# Patient Record
Sex: Female | Born: 1935 | Race: Black or African American | Hispanic: No | Smoking: Never smoker
Health system: Southern US, Community
[De-identification: ages and names within clinical notes are randomized; demographics above are authoritative.]

## PROBLEM LIST (undated history)

## (undated) DIAGNOSIS — N2889 Other specified disorders of kidney and ureter: Secondary | ICD-10-CM

## (undated) DIAGNOSIS — I82442 Acute embolism and thrombosis of left tibial vein: Secondary | ICD-10-CM

## (undated) DIAGNOSIS — K921 Melena: Secondary | ICD-10-CM

## (undated) DIAGNOSIS — I639 Cerebral infarction, unspecified: Secondary | ICD-10-CM

## (undated) DIAGNOSIS — E876 Hypokalemia: Secondary | ICD-10-CM

## (undated) DIAGNOSIS — C801 Malignant (primary) neoplasm, unspecified: Secondary | ICD-10-CM

## (undated) DIAGNOSIS — F32A Depression, unspecified: Secondary | ICD-10-CM

## (undated) DIAGNOSIS — M4800 Spinal stenosis, site unspecified: Secondary | ICD-10-CM

## (undated) DIAGNOSIS — K08109 Complete loss of teeth, unspecified cause, unspecified class: Secondary | ICD-10-CM

## (undated) DIAGNOSIS — I10 Essential (primary) hypertension: Secondary | ICD-10-CM

## (undated) DIAGNOSIS — I471 Supraventricular tachycardia: Secondary | ICD-10-CM

## (undated) DIAGNOSIS — F329 Major depressive disorder, single episode, unspecified: Secondary | ICD-10-CM

## (undated) DIAGNOSIS — K219 Gastro-esophageal reflux disease without esophagitis: Secondary | ICD-10-CM

## (undated) DIAGNOSIS — T4145XA Adverse effect of unspecified anesthetic, initial encounter: Secondary | ICD-10-CM

## (undated) DIAGNOSIS — E119 Type 2 diabetes mellitus without complications: Secondary | ICD-10-CM

## (undated) DIAGNOSIS — N189 Chronic kidney disease, unspecified: Secondary | ICD-10-CM

## (undated) DIAGNOSIS — M1990 Unspecified osteoarthritis, unspecified site: Secondary | ICD-10-CM

## (undated) DIAGNOSIS — T8859XA Other complications of anesthesia, initial encounter: Secondary | ICD-10-CM

## (undated) DIAGNOSIS — C241 Malignant neoplasm of ampulla of Vater: Secondary | ICD-10-CM

## (undated) DIAGNOSIS — Z972 Presence of dental prosthetic device (complete) (partial): Secondary | ICD-10-CM

## (undated) DIAGNOSIS — K3184 Gastroparesis: Secondary | ICD-10-CM

## (undated) DIAGNOSIS — E039 Hypothyroidism, unspecified: Secondary | ICD-10-CM

## (undated) DIAGNOSIS — F419 Anxiety disorder, unspecified: Secondary | ICD-10-CM

## (undated) DIAGNOSIS — J189 Pneumonia, unspecified organism: Secondary | ICD-10-CM

## (undated) DIAGNOSIS — M48062 Spinal stenosis, lumbar region with neurogenic claudication: Secondary | ICD-10-CM

## (undated) DIAGNOSIS — E785 Hyperlipidemia, unspecified: Secondary | ICD-10-CM

## (undated) HISTORY — DX: Type 2 diabetes mellitus without complications: E11.9

## (undated) HISTORY — DX: Spinal stenosis, lumbar region with neurogenic claudication: M48.062

## (undated) HISTORY — DX: Malignant (primary) neoplasm, unspecified: C80.1

## (undated) HISTORY — PX: OTHER SURGICAL HISTORY: SHX169

## (undated) HISTORY — PX: ABDOMINAL HYSTERECTOMY: SHX81

## (undated) HISTORY — DX: Cerebral infarction, unspecified: I63.9

## (undated) HISTORY — DX: Malignant neoplasm of ampulla of Vater: C24.1

## (undated) HISTORY — DX: Unspecified osteoarthritis, unspecified site: M19.90

## (undated) HISTORY — PX: LUMBAR SPINE SURGERY: SHX701

## (undated) HISTORY — DX: Acute embolism and thrombosis of left tibial vein: I82.442

## (undated) HISTORY — DX: Spinal stenosis, site unspecified: M48.00

## (undated) HISTORY — DX: Supraventricular tachycardia: I47.1

## (undated) HISTORY — DX: Gastroparesis: K31.84

## (undated) HISTORY — DX: Gastro-esophageal reflux disease without esophagitis: K21.9

## (undated) HISTORY — DX: Hyperlipidemia, unspecified: E78.5

## (undated) HISTORY — PX: TONSILLECTOMY: SUR1361

## (undated) HISTORY — PX: COLONOSCOPY W/ POLYPECTOMY: SHX1380

## (undated) HISTORY — DX: Hypokalemia: E87.6

## (undated) HISTORY — DX: Other specified disorders of kidney and ureter: N28.89

---

## 1997-12-04 ENCOUNTER — Ambulatory Visit (HOSPITAL_COMMUNITY): Admission: RE | Admit: 1997-12-04 | Discharge: 1997-12-04 | Payer: Self-pay | Admitting: Cardiology

## 1997-12-20 ENCOUNTER — Encounter: Admission: RE | Admit: 1997-12-20 | Discharge: 1998-03-20 | Payer: Self-pay | Admitting: Cardiology

## 1998-01-28 ENCOUNTER — Other Ambulatory Visit: Admission: RE | Admit: 1998-01-28 | Discharge: 1998-01-28 | Payer: Self-pay | Admitting: Internal Medicine

## 1999-05-14 ENCOUNTER — Encounter: Admission: RE | Admit: 1999-05-14 | Discharge: 1999-08-12 | Payer: Self-pay | Admitting: Internal Medicine

## 1999-05-14 ENCOUNTER — Encounter (HOSPITAL_BASED_OUTPATIENT_CLINIC_OR_DEPARTMENT_OTHER): Payer: Self-pay | Admitting: Internal Medicine

## 1999-12-15 ENCOUNTER — Ambulatory Visit (HOSPITAL_COMMUNITY): Admission: RE | Admit: 1999-12-15 | Discharge: 1999-12-15 | Payer: Self-pay | Admitting: Cardiology

## 1999-12-15 ENCOUNTER — Encounter: Payer: Self-pay | Admitting: Cardiology

## 2000-07-15 ENCOUNTER — Encounter: Admission: RE | Admit: 2000-07-15 | Discharge: 2000-07-15 | Payer: Self-pay | Admitting: Cardiology

## 2000-07-15 ENCOUNTER — Encounter: Payer: Self-pay | Admitting: Cardiology

## 2002-08-11 ENCOUNTER — Emergency Department (HOSPITAL_COMMUNITY): Admission: EM | Admit: 2002-08-11 | Discharge: 2002-08-11 | Payer: Self-pay | Admitting: Emergency Medicine

## 2002-08-11 ENCOUNTER — Encounter: Payer: Self-pay | Admitting: Emergency Medicine

## 2002-09-08 ENCOUNTER — Ambulatory Visit (HOSPITAL_COMMUNITY): Admission: RE | Admit: 2002-09-08 | Discharge: 2002-09-08 | Payer: Self-pay | Admitting: Cardiology

## 2002-09-08 ENCOUNTER — Encounter: Payer: Self-pay | Admitting: Cardiology

## 2002-10-23 ENCOUNTER — Emergency Department (HOSPITAL_COMMUNITY): Admission: EM | Admit: 2002-10-23 | Discharge: 2002-10-23 | Payer: Self-pay | Admitting: Emergency Medicine

## 2002-10-24 ENCOUNTER — Ambulatory Visit (HOSPITAL_COMMUNITY): Admission: RE | Admit: 2002-10-24 | Discharge: 2002-10-24 | Payer: Self-pay | Admitting: Cardiology

## 2003-08-18 HISTORY — PX: THYROIDECTOMY: SHX17

## 2003-08-21 ENCOUNTER — Inpatient Hospital Stay (HOSPITAL_COMMUNITY): Admission: EM | Admit: 2003-08-21 | Discharge: 2003-08-23 | Payer: Self-pay | Admitting: Emergency Medicine

## 2004-04-03 ENCOUNTER — Ambulatory Visit (HOSPITAL_COMMUNITY): Admission: RE | Admit: 2004-04-03 | Discharge: 2004-04-03 | Payer: Self-pay | Admitting: Internal Medicine

## 2004-06-26 ENCOUNTER — Ambulatory Visit (HOSPITAL_COMMUNITY): Admission: RE | Admit: 2004-06-26 | Discharge: 2004-06-27 | Payer: Self-pay | Admitting: General Surgery

## 2004-06-26 ENCOUNTER — Encounter (INDEPENDENT_AMBULATORY_CARE_PROVIDER_SITE_OTHER): Payer: Self-pay | Admitting: *Deleted

## 2004-07-21 ENCOUNTER — Encounter (HOSPITAL_COMMUNITY): Admission: RE | Admit: 2004-07-21 | Discharge: 2004-10-19 | Payer: Self-pay | Admitting: Endocrinology

## 2004-08-05 ENCOUNTER — Ambulatory Visit: Payer: Self-pay | Admitting: Endocrinology

## 2004-09-05 ENCOUNTER — Ambulatory Visit: Payer: Self-pay | Admitting: Endocrinology

## 2004-09-09 ENCOUNTER — Ambulatory Visit: Payer: Self-pay | Admitting: Endocrinology

## 2004-09-17 ENCOUNTER — Ambulatory Visit: Payer: Self-pay | Admitting: Endocrinology

## 2004-09-25 ENCOUNTER — Ambulatory Visit (HOSPITAL_COMMUNITY): Admission: RE | Admit: 2004-09-25 | Discharge: 2004-09-25 | Payer: Self-pay | Admitting: Endocrinology

## 2004-10-21 ENCOUNTER — Ambulatory Visit: Payer: Self-pay | Admitting: Endocrinology

## 2004-11-17 ENCOUNTER — Ambulatory Visit: Admission: RE | Admit: 2004-11-17 | Discharge: 2004-11-17 | Payer: Self-pay | Admitting: Endocrinology

## 2004-11-20 ENCOUNTER — Ambulatory Visit: Payer: Self-pay | Admitting: Endocrinology

## 2005-01-16 ENCOUNTER — Encounter (INDEPENDENT_AMBULATORY_CARE_PROVIDER_SITE_OTHER): Payer: Self-pay | Admitting: *Deleted

## 2005-01-16 ENCOUNTER — Ambulatory Visit (HOSPITAL_COMMUNITY): Admission: RE | Admit: 2005-01-16 | Discharge: 2005-01-16 | Payer: Self-pay | Admitting: Gastroenterology

## 2005-02-02 ENCOUNTER — Encounter: Admission: RE | Admit: 2005-02-02 | Discharge: 2005-02-02 | Payer: Self-pay | Admitting: Gastroenterology

## 2005-02-19 ENCOUNTER — Encounter: Admission: RE | Admit: 2005-02-19 | Discharge: 2005-02-19 | Payer: Self-pay | Admitting: Gastroenterology

## 2005-04-08 ENCOUNTER — Ambulatory Visit: Payer: Self-pay | Admitting: Endocrinology

## 2005-05-08 ENCOUNTER — Ambulatory Visit: Payer: Self-pay | Admitting: Endocrinology

## 2005-05-29 ENCOUNTER — Ambulatory Visit: Payer: Self-pay | Admitting: Endocrinology

## 2005-06-01 ENCOUNTER — Encounter (HOSPITAL_COMMUNITY): Admission: RE | Admit: 2005-06-01 | Discharge: 2005-08-11 | Payer: Self-pay | Admitting: Endocrinology

## 2005-07-14 ENCOUNTER — Ambulatory Visit: Payer: Self-pay | Admitting: Endocrinology

## 2005-07-29 ENCOUNTER — Ambulatory Visit: Payer: Self-pay | Admitting: Cardiology

## 2005-11-06 ENCOUNTER — Ambulatory Visit: Payer: Self-pay | Admitting: Endocrinology

## 2007-04-20 ENCOUNTER — Encounter: Payer: Self-pay | Admitting: Endocrinology

## 2007-04-20 DIAGNOSIS — E119 Type 2 diabetes mellitus without complications: Secondary | ICD-10-CM | POA: Insufficient documentation

## 2007-04-20 DIAGNOSIS — E785 Hyperlipidemia, unspecified: Secondary | ICD-10-CM

## 2007-04-20 HISTORY — DX: Hyperlipidemia, unspecified: E78.5

## 2007-04-20 HISTORY — DX: Type 2 diabetes mellitus without complications: E11.9

## 2008-10-25 ENCOUNTER — Encounter: Admission: RE | Admit: 2008-10-25 | Discharge: 2008-10-25 | Payer: Self-pay | Admitting: Internal Medicine

## 2008-11-05 ENCOUNTER — Encounter: Admission: RE | Admit: 2008-11-05 | Discharge: 2008-11-05 | Payer: Self-pay | Admitting: Internal Medicine

## 2008-11-08 ENCOUNTER — Encounter: Admission: RE | Admit: 2008-11-08 | Discharge: 2008-11-08 | Payer: Self-pay | Admitting: Internal Medicine

## 2009-08-20 ENCOUNTER — Encounter: Admission: RE | Admit: 2009-08-20 | Discharge: 2009-08-20 | Payer: Self-pay | Admitting: Internal Medicine

## 2009-08-29 ENCOUNTER — Encounter: Admission: RE | Admit: 2009-08-29 | Discharge: 2009-08-29 | Payer: Self-pay | Admitting: Internal Medicine

## 2009-10-14 ENCOUNTER — Encounter (HOSPITAL_COMMUNITY): Admission: RE | Admit: 2009-10-14 | Discharge: 2009-12-17 | Payer: Self-pay | Admitting: Internal Medicine

## 2009-12-24 ENCOUNTER — Encounter: Admission: RE | Admit: 2009-12-24 | Discharge: 2009-12-24 | Payer: Self-pay | Admitting: Internal Medicine

## 2010-03-12 ENCOUNTER — Encounter: Admission: RE | Admit: 2010-03-12 | Discharge: 2010-03-12 | Payer: Self-pay | Admitting: Internal Medicine

## 2010-05-12 ENCOUNTER — Encounter (HOSPITAL_COMMUNITY): Admission: RE | Admit: 2010-05-12 | Discharge: 2010-07-21 | Payer: Self-pay | Admitting: Internal Medicine

## 2010-09-06 ENCOUNTER — Encounter: Payer: Self-pay | Admitting: Endocrinology

## 2010-09-07 ENCOUNTER — Encounter: Payer: Self-pay | Admitting: Internal Medicine

## 2010-11-09 LAB — ANTI-THYROGLOBULIN ANTIBODY: Thyroglobulin Ab: 41.1 U/mL (ref 0.0–60.0)

## 2011-01-02 NOTE — Op Note (Signed)
NAME:  Kaylee Mullins, Kaylee Mullins               ACCOUNT NO.:  [id number]   MEDICAL RECORD NO.:  [id number]          PATIENT TYPE:  OIB   LOCATION:  5715                         FACILITY:  [facility]   PHYSICIAN:  Jimmye [name] III, M.D.  DATE OF BIRTH:  1936-06-10   DATE OF PROCEDURE:  06/26/2004  DATE OF DISCHARGE:                                 OPERATIVE REPORT   PREOPERATIVE DIAGNOSIS:  Multinodular goiter on the right side with a  dominant mass or nodule.   POSTOPERATIVE DIAGNOSIS:  Multinodular goiter on the right side with a  dominant mass or nodule.   PROCEDURE:  Complete total right thyroid lobectomy with a pyramidal lobe  excision.   SURGEON:  Jimmye [name], M.D.   ASSISTANT:  Angelia Mould. Derrell Lolling, M.D.   ANESTHESIA:  General endotracheal.   ESTIMATED BLOOD LOSS:  Less than 20 cc.   COMPLICATIONS:  None.   CONDITION:  Stable.   FINDINGS:  The patient had a very large goiter on the right side with a  dominant mass in the right upper lobe.  The pyramidal lobe was adhered to  the trachea but easily excised.  The recurrent laryngeal nerve was not  visualized along the groove as we stayed close to the gland.  There was a  very obvious what appeared to be and somewhat enlarged right inferior  parathyroid gland.   OPERATION:  The patient was taken to the operating room and placed on the  table in the supine position.  After an adequate endotracheal anesthetic was  administered, and she was prepped and draped in the usual sterile manner,  her neck in hyperextension and a roll underneath her shoulders, and she was  prepped with Betadine in the usual sterile manner.   The patient's previous thyroid incision was used for this one with most of  the mid-portion and the part to the right being used.  We took it down into  and through what appeared to be a scarred platysmas layer.  There was used  thyroid clamps to elevate the superior skin flaps;  however, we did not need  to mobilize inferiorly  as we were down towards the sternal notch.  We placed  an [employer] retractor underneath the strap muscles on the right side, then  subsequently dissected out the right lobe of the thyroid gland, mobilizing  it medially.  We were able to take a large middle thyroid vessel medially  with medium clips and transected it.  We then dissected out the superior  thyroid vessels using a right angle clamp and medium clips, doubly clipping  the part remaining in the patient's body.  On the gland side, we clipped  singly and then cut with Metzenbaum scissors.  We continued to roll the  gland medially and stay close to the gland as we dissected off all of the  structures on top of it.  We took most of the vessels with 2-0 and 3-0 silk  ties, making sure we stayed close to the gland.   Inferiorly, the inferior thyroid vessels were mobilized and seen and mostly  Endoclipped with  medium size Hemoclips.  They were transected, and as we  stayed close to the gland and rolled it medially, we were able to dissect  away more proximal vessels using right angle clamps, a Kitner dissector, and  clips.   We ultimately were able to detach the thyroid gland superiorly from its  thyroid ligament using electrocautery mobilizing it and freeing up easily  away from the trachea.  We again stayed very close to the gland, leaving a  very small portion attached to the trachea at the site of the superior  laryngeal nerve.  We then dissected off the pyramidal lobe off the trachea  after we had excised the right lobe completely.  We palpated multiple  nodules in the right gland.   There was excellent hemostasis at the conclusion of the case.  We did leave  a small strip of Surgicel in the pocket on the right side;  however, no  further electrocautery was necessary, and we irrigated with saline and  subsequently closed.   The strap muscles were reapproximated using interrupted sutures of 3-0  Vicryl.  Then, the platysmas  layer was reapproximated using buried stitches  of 3-0 Vicryl.  The skin was closed using interrupted 5-0 nylon sutures with  intervening 1/4-inch Steri-Strips.  All needle counts, sponge counts, and  instrument counts were correct.  A [place] dressing was applied with  Betadine ointment directly on the wound.       JW/MEDQ  D:  06/26/2004  T:  06/26/2004  Job:  [id number]

## 2011-01-02 NOTE — Op Note (Signed)
NAME:  Kaylee Mullins, Kaylee Mullins               ACCOUNT NO.:  [id number]   MEDICAL RECORD NO.:  [id number]          PATIENT TYPE:  AMB   LOCATION:  ENDO                         FACILITY:  [facility]   PHYSICIAN:  Petra Kuba, M.D.    DATE OF BIRTH:  Dec 13, 1935   DATE OF PROCEDURE:  01/16/2005  DATE OF DISCHARGE:                                 OPERATIVE REPORT   PROCEDURE:  Colonoscopy with polypectomy, hot biopsy, and biopsy.   INDICATION:  Patient with diarrhea and abdominal pain overdue for a colonic  screening.  Consent was signed after risk, benefits, methods, options  thoroughly discussed in the office.   MEDICINES USED:  1.  Demerol 100 mg.  2.  Versed 10 mg.   DESCRIPTION OF PROCEDURE:  Rectal inspection is pertinent for external  hemorrhoids, small.  Digital exam was negative.  The video colonoscope was  inserted and easily advanced around the colon to the cecum.  This did  require some abdominal pressure, but no position changes.  Other than  scattered left and right diverticula, no abnormalities were seen on  insertion.  To advance to the terminal ileum required rolling her onto her  back.  __________was normal.  Scattered biopsies were obtained.  The scope  was slowly withdrawn.  Random colon biopsies were obtained and put in the  second container.  The prep was adequate.  There was some liquid stool that  required washing and suction.  On slow withdrawal through the colon, other  than the scattered right and left diverticula in the more distal transverse,  a small to medium-sized semi-sessile polyp was seen, snared, electrocautery  applied, and the polyp was suctioned through the scope and collected in the  trap.  There seemed to be some residual polyp.  The snare did cut through it  one more time, and this piece was suctioned through the scope and collected  in the trap, and then we took two hot biopsies of the base and possibly some  remaining polypoid tissue.  There was a nice,  white coagulum and no obvious  residual polypoid tissue after the hot biopsy.  The scope was further  withdrawn.  Scattered biopsies were obtained again of the colon to rule out  microscopic colitis.  In the sigmoid, two tiny polyps were seen and were  both hot biopsied and put in the fourth container.  Anorectal pull-through  and retroflexion confirmed some small hemorrhoids.  The scope was  straightened and readvanced a small ways up the left side of the colon, air  was suctioned, and the scope removed.  The patient tolerated the procedure  well.  There was no obvious, immediate complication.   ENDOSCOPIC DIAGNOSES:  1.  Internal and external hemorrhoids.  2.  Scattered left and right diverticula.  3.  Two tiny sigmoid polyps hot biopsied.  4.  Distal transverse, small to medium-sized polyp snared x2 and hot      biopsied at the base.  5.  Otherwise within normal limits to the terminal ileum, status post random      biopsies throughout.  PLAN:  1.  Await pathology to determine future plan and screening to rule out      microscopic colitis.  If her symptoms      continue, consider upper GI with small bowel follow or gallbladder      ultrasound.  2.  Will check on the patient when we review pathology and decide further      workup plans or followup at that juncture.      MEM/MEDQ  D:  01/16/2005  T:  01/17/2005  Job:  [id number]

## 2011-01-02 NOTE — Discharge Summary (Signed)
NAME:  Kaylee Mullins, Kaylee Mullins                         ACCOUNT NO.:  [id number]   MEDICAL RECORD NO.:  [id number]                   PATIENT TYPE:  INP   LOCATION:  3705                                 FACILITY:  [facility]   PHYSICIAN:  Eduardo Osier. Sharyn Lull, M.D.              DATE OF BIRTH:  Sep 14, 1935   DATE OF ADMISSION:  08/21/2003  DATE OF DISCHARGE:  08/23/2003                                 DISCHARGE SUMMARY   ADMITTING DIAGNOSES:  1. Chest pain, rule out myocardial infarction.  2. Uncontrolled hypertension secondary to noncompliance.  3. Non-insulin-dependent diabetes mellitus.  4. Hypercholesterolemia.  5. Morbid obesity.  6. Gastroesophageal reflux disease.  7. Positive family history of coronary artery disease.   DISCHARGE DIAGNOSES:  1. Status post chest pain.  Negative Persantine Cardiolite.  Rule out     gastroesophageal reflux disease.  Rule out peptic ulcer disease.  2. Hypertension.  3. Non-insulin-dependent diabetes mellitus.  4. Hypercholesterolemia.  5. Morbid obesity.  6. Positive family history of coronary artery disease.   DISCHARGE MEDICATIONS:  1. Toprol XL 100 mg one tablet b.i.d.  2. Benicar 40/25 mg one tablet daily.  3. Norvasc 5 mg one tablet daily.  4. Lipitor 20 mg one tablet daily.  5. Baby aspirin 81 mg one tablet daily.  6. Protonix 40 mg one tablet daily.  7. Metformin 500 mg one tablet b.i.d.  8. Amaryl 4 mg one tablet daily.  9. Nitrostat sublingual, use as directed.   ACTIVITY:  As tolerated.   DIET:  Low salt, low cholesterol 1800 calorie ADA diet.  The patient has  been advised to monitor blood sugar as before.   FOLLOWUP:  Follow up with me in two weeks.   CONDITION ON DISCHARGE:  Stable.   BRIEF HISTORY AND HOSPITAL COURSE:  [name] is a 75 year old black  female with past medical history significant for hypertension, non-insulin-  dependent diabetes mellitus, hypercholesterolemia, morbid obesity.  She came  to the ER complaining of  retrosternal chest pressure off and on since last  night.  Took Tums initially with partial relief.  Pain was localized with  grade 8/10 and then took aspirin with relief of chest pain.  Denies any  nausea, vomiting, or diaphoresis.  Denies exertional chest pain in the past.  Denies rest or nocturnal angina.  The patient was seen in the office a few  days ago for leg swelling and calcium channel blockers were started and was  started on alpha blockers which she has not started.  The patient denies any  palpitation, lightheadedness, or syncope.  States leg swelling improved  after stopping calcium channel blockers.  Denies any cough, fever, chills.   PAST MEDICAL HISTORY:  As above.   PAST SURGICAL HISTORY:  1. She had goiter resection in 1960s.  2. Had partial hysterectomy in 1971.  3. Had tonsillectomy at age of 53.  4. Had back surgery for  spinal stenosis in 1999.  5. Had right shoulder cortisone injections recently.   ALLERGIES:  DARVON.   MEDICATIONS:  1. Toprol XL 100 mg p.o. b.i.d.  2. Benicar 40/25 mg p.o. daily.  3. Metformin 500 mg p.o. b.i.d.  4. Amaryl 400 mg p.o. daily.  5. Lipitor 20 mg p.o. daily.  6. Tiazac 300 mg p.o. daily.  7. Baby aspirin 81 mg p.o. daily.   SOCIAL HISTORY:  She is separated.  No history of smoking or alcohol abuse.  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   FAMILY HISTORY:  Father died of MI at the age of 29.  Mother is alive.  She  has sick sinus syndrome, subsequently had permanent pacemaker.  One sister  has enlarged heart.  One sister died of CA of pancreas.  One brother had  spinal stenosis.  One sister has severe degenerative joint disease.   PHYSICAL EXAMINATION:  GENERAL:  She was alert, awake, oriented x3, no acute  distress.  VITAL SIGNS:  Blood pressure 178/104, pulse 62, regular.  HEENT:  Conjunctiva was pink.  NECK:  Supple.  No JVD.  No bruit.  LUNGS:  Clear to auscultation without rhonchi or rales.   CARDIOVASCULAR:  S1, S2 was normal.  There was a soft S4 gallop.  There was  no S3 gallop or rub.  ABDOMEN:  Soft.  Bowel sounds are present.  Obese, nontender.  EXTREMITIES:  There is no clubbing, cyanosis, edema.   LABORATORIES:  Her EKG showed normal sinus rhythm with LVH with nonspecific  diffuse T-wave changes.  Chest x-ray showed mild cardiomegaly.  Her C  reactive protein was low.  It was 0.2.  Cholesterol was 144, triglyceride  58, LDL 68, HDL 64.  Hemoglobin A1C was 7.5 which was elevated.  Sodium 141,  potassium 3.8, chloride 105, bicarbonate 30, blood sugar 120, BUN 16,  creatinine 0.6.  Liver enzymes were normal.  Two sets of troponin I and CPK-  MBs were negative.   HOSPITAL COURSE:  The patient was admitted to telemetry unit and was started  on IV heparin and nitrates.  MI was rule out by serial enzymes and EKG.  The  patient underwent subsequently Persantine Cardiolite which showed no  evidence of reversible ischemia with questionable inferior wall scar with EF  of 63%.  The patient has been ambulating in hallway and is off heparin and  IV nitrates.  The patient will be discharged home on above medications and  will be followed up in my office in two weeks.                                                Eduardo Osier. Sharyn Lull, M.D.    MNH/MEDQ  D:  08/23/2003  T:  08/24/2003  Job:  [id number]

## 2011-06-03 ENCOUNTER — Other Ambulatory Visit: Payer: Self-pay | Admitting: Internal Medicine

## 2011-06-03 DIAGNOSIS — R921 Mammographic calcification found on diagnostic imaging of breast: Secondary | ICD-10-CM

## 2011-06-16 ENCOUNTER — Ambulatory Visit
Admission: RE | Admit: 2011-06-16 | Discharge: 2011-06-16 | Disposition: A | Discharge status: Home / Self Care | Payer: PRIVATE HEALTH INSURANCE | Source: Ambulatory Visit | Attending: Internal Medicine | Admitting: Internal Medicine

## 2011-06-16 DIAGNOSIS — R921 Mammographic calcification found on diagnostic imaging of breast: Secondary | ICD-10-CM

## 2011-06-25 ENCOUNTER — Other Ambulatory Visit: Payer: Self-pay | Admitting: Internal Medicine

## 2011-06-25 DIAGNOSIS — C73 Malignant neoplasm of thyroid gland: Secondary | ICD-10-CM

## 2011-07-03 ENCOUNTER — Ambulatory Visit
Admission: RE | Admit: 2011-07-03 | Discharge: 2011-07-03 | Disposition: A | Discharge status: Home / Self Care | Payer: PRIVATE HEALTH INSURANCE | Source: Ambulatory Visit | Attending: Internal Medicine | Admitting: Internal Medicine

## 2011-07-03 DIAGNOSIS — C73 Malignant neoplasm of thyroid gland: Secondary | ICD-10-CM

## 2012-02-03 ENCOUNTER — Other Ambulatory Visit: Payer: Self-pay | Admitting: Gastroenterology

## 2012-03-29 ENCOUNTER — Encounter (HOSPITAL_COMMUNITY)
Admission: RE | Disposition: A | Discharge status: Home / Self Care | Payer: Self-pay | Source: Ambulatory Visit | Attending: Gastroenterology

## 2012-03-29 ENCOUNTER — Ambulatory Visit (HOSPITAL_COMMUNITY)
Admission: RE | Admit: 2012-03-29 | Discharge: 2012-03-29 | Disposition: A | Discharge status: Home / Self Care | Payer: PRIVATE HEALTH INSURANCE | Source: Ambulatory Visit | Attending: Gastroenterology | Admitting: Gastroenterology

## 2012-03-29 ENCOUNTER — Encounter (HOSPITAL_COMMUNITY): Payer: Self-pay | Admitting: *Deleted

## 2012-03-29 DIAGNOSIS — K620 Anal polyp: Secondary | ICD-10-CM | POA: Insufficient documentation

## 2012-03-29 DIAGNOSIS — K621 Rectal polyp: Secondary | ICD-10-CM | POA: Insufficient documentation

## 2012-03-29 HISTORY — DX: Hypothyroidism, unspecified: E03.9

## 2012-03-29 HISTORY — PX: HOT HEMOSTASIS: SHX5433

## 2012-03-29 HISTORY — DX: Essential (primary) hypertension: I10

## 2012-03-29 HISTORY — DX: Malignant (primary) neoplasm, unspecified: C80.1

## 2012-03-29 HISTORY — PX: FLEXIBLE SIGMOIDOSCOPY: SHX5431

## 2012-03-29 LAB — GLUCOSE, CAPILLARY: Glucose-Capillary: 104 mg/dL — ABNORMAL HIGH (ref 70–99)

## 2012-03-29 SURGERY — SIGMOIDOSCOPY, FLEXIBLE
Anesthesia: Moderate Sedation

## 2012-03-29 MED ORDER — SODIUM CHLORIDE 0.9 % IV SOLN
INTRAVENOUS | Status: DC
  Administered 2012-03-29: 500 mL via INTRAVENOUS

## 2012-03-29 MED ORDER — DIPHENHYDRAMINE HCL 50 MG/ML IJ SOLN
INTRAMUSCULAR | Status: AC
  Filled 2012-03-29: qty 1

## 2012-03-29 MED ORDER — MIDAZOLAM HCL 5 MG/ML IJ SOLN
INTRAMUSCULAR | Status: AC
  Filled 2012-03-29: qty 2

## 2012-03-29 MED ORDER — FENTANYL CITRATE 0.05 MG/ML IJ SOLN
INTRAMUSCULAR | Status: DC | PRN
  Administered 2012-03-29: 12.5 ug via INTRAVENOUS
  Administered 2012-03-29 (×2): 25 ug via INTRAVENOUS

## 2012-03-29 MED ORDER — FENTANYL CITRATE 0.05 MG/ML IJ SOLN
INTRAMUSCULAR | Status: AC
  Filled 2012-03-29: qty 2

## 2012-03-29 MED ORDER — MIDAZOLAM HCL 10 MG/2ML IJ SOLN
INTRAMUSCULAR | Status: DC | PRN
  Administered 2012-03-29: 2 mg via INTRAVENOUS
  Administered 2012-03-29: 1 mg via INTRAVENOUS
  Administered 2012-03-29: 2 mg via INTRAVENOUS

## 2012-03-29 NOTE — Op Note (Signed)
[facility] [address] [place], [place]  [address]  FLEXIBLE SIGMOIDOSCOPY PROCEDURE REPORT  PATIENT:  Lai, Hendriks  MR#:  [id number] BIRTHDATE:  13-Mar-1936, 75 yrs. old  GENDER:  female  ENDOSCOPIST:  Vida Rigger, MD Referred by:  PROCEDURE DATE:  03/29/2012 PROCEDURE:  Flexible Sigmoidoscopy with polypectomy ASA CLASS:  Class II INDICATIONS:  anal rectal polyp with high-grade dysplasia want to reevaluate and see if any residual and rere biopsy or remove if need  MEDICATIONS:  62.5 mcg fentanyl mg Versed DESCRIPTION OF PROCEDURE:   After the risks benefits and alternatives of the procedure were thoroughly explained, informed consent was obtained.  Digital rectal exam was performed and revealed no abnormalities.   The pentax [id number] and [id number]) endoscope was introduced through the anus and advanced to the sigmoid colon at approximately 40 cm, without limitations. The quality of the prep was adequate.  The instrument was then slowly withdrawn as the mucosa was fully examined. Multiple anal rectal pull-through and retroflexion's were done seem like possibly there were 2 tiny residual polyps which were both hot biopsied on retroflexion but no other abnormalities were seen and after a prolonged evaluation air was suctioned and the scope was withdrawn and the patient tolerated the procedure well and there was no signs of bleeding or other complication <<PROCEDUREIMAGES>>  The examination was normal with no additional endoscopic findings except small hemorrhoids. Retroflexed views in the rectum revealed Internal hemorrhoids and a questionable 2 tiny polyps as above. The scope was then withdrawn from the patient and the procedure terminated.  COMPLICATIONS:  A complication of none occurred on 03/29/2012 at.  ENDOSCOPIC IMPRESSION: 1) Internal hemorrhoids were seen on retroflexion.2. 2 tiny anal rectal questionable polyps versus residual  tissue status post hot biopsy times two 3. Otherwise within normal limits to 40 cm and probably proximal sigmoid RECOMMENDATIONS: Await pathology to determine future screening call me when necessary and followup when necessary  REPEAT EXAM:  Pending pathology  ______________________________ Vida Rigger, MD  CC:  n. eSIGNEDVida Rigger at 03/29/2012 01:56 PM  Annabell Sabal, [phone]

## 2012-03-29 NOTE — Progress Notes (Signed)
Kaylee Mullins 1:19 PM  Subjective: The patient has no new complaints or problems since we saw her in the office yesterday  Objective: Vital signs stable afebrile no acute distress normal physical exam please see pre-procedure assessment  Assessment: Anal rectal polyp with high-grade dysplasia want to reevaluate  Plan: Okay to proceed with sedated flexible sigmoidoscopy  Kaylee Mullins E

## 2012-03-30 ENCOUNTER — Encounter (HOSPITAL_COMMUNITY): Payer: Self-pay | Admitting: Gastroenterology

## 2012-06-27 ENCOUNTER — Other Ambulatory Visit: Payer: Self-pay | Admitting: Internal Medicine

## 2012-06-27 DIAGNOSIS — Z1231 Encounter for screening mammogram for malignant neoplasm of breast: Secondary | ICD-10-CM

## 2012-07-01 ENCOUNTER — Ambulatory Visit: Payer: PRIVATE HEALTH INSURANCE

## 2012-07-07 ENCOUNTER — Other Ambulatory Visit: Payer: Self-pay | Admitting: Internal Medicine

## 2012-07-07 DIAGNOSIS — C73 Malignant neoplasm of thyroid gland: Secondary | ICD-10-CM

## 2012-07-12 ENCOUNTER — Ambulatory Visit
Admission: RE | Admit: 2012-07-12 | Discharge: 2012-07-12 | Disposition: A | Discharge status: Home / Self Care | Payer: PRIVATE HEALTH INSURANCE | Source: Ambulatory Visit | Attending: Internal Medicine | Admitting: Internal Medicine

## 2012-07-12 DIAGNOSIS — C73 Malignant neoplasm of thyroid gland: Secondary | ICD-10-CM

## 2012-08-16 ENCOUNTER — Ambulatory Visit
Admission: RE | Admit: 2012-08-16 | Discharge: 2012-08-16 | Disposition: A | Discharge status: Home / Self Care | Payer: PRIVATE HEALTH INSURANCE | Source: Ambulatory Visit | Attending: Internal Medicine | Admitting: Internal Medicine

## 2012-08-16 DIAGNOSIS — Z1231 Encounter for screening mammogram for malignant neoplasm of breast: Secondary | ICD-10-CM

## 2013-03-21 ENCOUNTER — Other Ambulatory Visit: Payer: Self-pay | Admitting: Gastroenterology

## 2013-06-30 ENCOUNTER — Emergency Department (INDEPENDENT_AMBULATORY_CARE_PROVIDER_SITE_OTHER)
Admission: EM | Admit: 2013-06-30 | Discharge: 2013-06-30 | Disposition: A | Discharge status: Home / Self Care | Payer: PRIVATE HEALTH INSURANCE | Source: Home / Self Care

## 2013-06-30 ENCOUNTER — Encounter (HOSPITAL_COMMUNITY): Payer: Self-pay | Admitting: Emergency Medicine

## 2013-06-30 DIAGNOSIS — S91119A Laceration without foreign body of unspecified toe without damage to nail, initial encounter: Secondary | ICD-10-CM

## 2013-06-30 DIAGNOSIS — R0781 Pleurodynia: Secondary | ICD-10-CM

## 2013-06-30 DIAGNOSIS — R071 Chest pain on breathing: Secondary | ICD-10-CM

## 2013-06-30 DIAGNOSIS — S91109A Unspecified open wound of unspecified toe(s) without damage to nail, initial encounter: Secondary | ICD-10-CM

## 2013-06-30 DIAGNOSIS — R079 Chest pain, unspecified: Secondary | ICD-10-CM

## 2013-06-30 MED ORDER — TRAMADOL HCL 50 MG PO TABS: 50.0000 mg | ORAL_TABLET | Freq: Four times a day (QID) | ORAL | Status: DC | PRN

## 2013-06-30 NOTE — ED Notes (Signed)
Dropped a can of spray starch on her L great toe 2 days ago.  3/4 " laceration  Over MTP joint.  Has been cleainng it a applying bacitracin ointment.  States she is a diabetic and noted yellowish drainage on bandaid after cleaning it.

## 2013-06-30 NOTE — ED Provider Notes (Signed)
CSN: [id number]     Arrival date & time 06/30/13  1035 History   First MD Initiated Contact with Patient 06/30/13 1204     Chief Complaint  Patient presents with  . Toe Injury   (Consider location/radiation/quality/duration/timing/severity/associated sxs/prior Treatment) HPI Comments: Will be 77 year old female presents with 2 complaints. First complaint is that of left toe pain. 2 days ago she dropped a can of food on top of the MCP of her left great toe. He produced a 1.5 cm superficial laceration. She has been cleaning it with soap and water, hydroperoxide and placing Neosporin ointment and a Band-Aid over. She denies injury to the toes. She is able to wiggle her toes and ambulate without difficulty.  The second complaint is that of pain along the left anterolateral costal margin radiating to the left posterior ribs. This began 2 days ago upon awakening. She denies any known injury, fall or blunt trauma. It hurts when she  take a deep breath, movement such as rising from a chair or sitting down. She denies cough, fever or shortness of breath.    Past Medical History  Diagnosis Date  . Hypertension   . Hypothyroidism   . Diabetes mellitus     type !  . Cancer     thyroid cancer   Past Surgical History  Procedure Laterality Date  . Thyroidectomy  2005  . Abdominal hysterectomy    . Tonsillectomy    . Lumbar spine surgery    . Flexible sigmoidoscopy  03/29/2012    Procedure: FLEXIBLE SIGMOIDOSCOPY;  Surgeon: Petra Kuba, MD;  Location: [facility] ENDOSCOPY;  Service: Endoscopy;  Laterality: N/A;  fleet enema upon arrival  . Hot hemostasis  03/29/2012    Procedure: HOT HEMOSTASIS (ARGON PLASMA COAGULATION/BICAP);  Surgeon: Petra Kuba, MD;  Location: [facility] ENDOSCOPY;  Service: Endoscopy;  Laterality: N/A;   Family History  Problem Relation Age of Onset  . Heart failure Mother   . Heart attack Father    History  Substance Use Topics  . Smoking status: Never Smoker   . Smokeless tobacco:  Not on file  . Alcohol Use: No   OB History   Grav Para Term Preterm Abortions TAB SAB Ect Mult Living                 Review of Systems  Constitutional: Negative for fever, chills, diaphoresis, activity change and fatigue.  HENT: Negative.   Respiratory: Negative for cough, chest tightness, shortness of breath and wheezing.   Cardiovascular: Positive for chest pain.  Gastrointestinal: Negative.   Genitourinary: Negative.   Musculoskeletal:       Rib pain  Skin: Positive for wound. Negative for rash.       As per HPI  Neurological: Negative.   Psychiatric/Behavioral: Negative.     Allergies  Darvon  Home Medications   Current Outpatient Rx  Name  Route  Sig  Dispense  Refill  . Canagliflozin (INVOKANA) 100 MG TABS   Oral   Take 100 mg by mouth daily.         Marland Kitchen diltiazem (TIAZAC) 360 MG 24 hr capsule   Oral   Take 360 mg by mouth daily.         . insulin aspart (NOVOLOG) 100 UNIT/ML injection   Subcutaneous   Inject into the skin 2 (two) times daily with a meal.         . levothyroxine (SYNTHROID, LEVOTHROID) 200 MCG tablet   Oral   Take  200 mcg by mouth daily.         Marland Kitchen losartan-hydrochlorothiazide (HYZAAR) 100-25 MG per tablet   Oral   Take 1 tablet by mouth daily.         . metoprolol (LOPRESSOR) 100 MG tablet   Oral   Take 100 mg by mouth 2 (two) times daily.         . traMADol (ULTRAM) 50 MG tablet   Oral   Take 1 tablet (50 mg total) by mouth every 6 (six) hours as needed.   15 tablet   0    BP 166/70  Pulse 62  Temp(Src) 98 F (36.7 C) (Oral)  Resp 16  SpO2 99% Physical Exam  Nursing note and vitals reviewed. Constitutional: She is oriented to person, place, and time. She appears well-developed and well-nourished. No distress.  HENT:  Head: Normocephalic and atraumatic.  Eyes: EOM are normal.  Neck: Normal range of motion. Neck supple.  Cardiovascular: Normal rate, regular rhythm and normal heart sounds.   Pulmonary/Chest:  Effort normal and breath sounds normal. No respiratory distress. She has no wheezes. She has no rales. She exhibits tenderness.  Abdominal: Soft. There is no tenderness.  Musculoskeletal: Normal range of motion. She exhibits edema.  Neurological: She is alert and oriented to person, place, and time. No cranial nerve deficit.  Skin: Skin is warm and dry.  1.5 cm superficial laceration involving dermis only over great toe MCP. Nl ROM with flex/ext. No bony or soft tissue tenderness. No signs of infection. No bleeding or drainage.  Psychiatric: She has a normal mood and affect.    ED Course  Procedures (including critical care time) Labs Review Labs Reviewed - No data to display Imaging Review No results found.    MDM   1. Costal margin pain   2. Rib pain on left side   3. Toe laceration, initial encounter      Ultram for pain in ribs Neosporin oint for 1 more day, then leave open or with a bandaid. Clean wit soap and water then dry For signs of infection rechk promptly. For cough, dyspnea or fever see your doctor or return  Hayden Rasmussen, NP 06/30/13 1229

## 2013-07-02 NOTE — ED Provider Notes (Signed)
Medical screening examination/treatment/procedure(s) were performed by resident physician or non-physician practitioner and as supervising physician I was immediately available for consultation/collaboration.   KINDL,JAMES DOUGLAS MD.   James D Kindl, MD 07/02/13 1123 

## 2013-07-17 ENCOUNTER — Other Ambulatory Visit: Payer: Self-pay

## 2013-07-17 DIAGNOSIS — Z1231 Encounter for screening mammogram for malignant neoplasm of breast: Secondary | ICD-10-CM

## 2013-08-23 ENCOUNTER — Ambulatory Visit: Payer: PRIVATE HEALTH INSURANCE

## 2013-12-11 ENCOUNTER — Other Ambulatory Visit: Payer: Self-pay | Admitting: Internal Medicine

## 2013-12-11 DIAGNOSIS — E2839 Other primary ovarian failure: Secondary | ICD-10-CM

## 2014-01-16 ENCOUNTER — Encounter (INDEPENDENT_AMBULATORY_CARE_PROVIDER_SITE_OTHER): Payer: Self-pay

## 2014-01-16 ENCOUNTER — Ambulatory Visit
Admission: RE | Admit: 2014-01-16 | Discharge: 2014-01-16 | Disposition: A | Discharge status: Home / Self Care | Payer: PRIVATE HEALTH INSURANCE | Source: Ambulatory Visit

## 2014-01-16 DIAGNOSIS — Z1231 Encounter for screening mammogram for malignant neoplasm of breast: Secondary | ICD-10-CM

## 2014-01-17 ENCOUNTER — Other Ambulatory Visit: Payer: Self-pay | Admitting: Internal Medicine

## 2014-01-17 DIAGNOSIS — R928 Other abnormal and inconclusive findings on diagnostic imaging of breast: Secondary | ICD-10-CM

## 2014-02-01 ENCOUNTER — Ambulatory Visit
Admission: RE | Admit: 2014-02-01 | Discharge: 2014-02-01 | Disposition: A | Discharge status: Home / Self Care | Payer: PRIVATE HEALTH INSURANCE | Source: Ambulatory Visit | Attending: Internal Medicine | Admitting: Internal Medicine

## 2014-02-01 DIAGNOSIS — R928 Other abnormal and inconclusive findings on diagnostic imaging of breast: Secondary | ICD-10-CM

## 2014-02-09 ENCOUNTER — Other Ambulatory Visit (HOSPITAL_COMMUNITY): Payer: Self-pay | Admitting: Internal Medicine

## 2014-02-09 DIAGNOSIS — C73 Malignant neoplasm of thyroid gland: Secondary | ICD-10-CM

## 2014-02-21 ENCOUNTER — Ambulatory Visit (HOSPITAL_COMMUNITY): Admission: RE | Admit: 2014-02-21 | Payer: PRIVATE HEALTH INSURANCE | Source: Ambulatory Visit

## 2014-02-26 ENCOUNTER — Ambulatory Visit (HOSPITAL_COMMUNITY): Payer: PRIVATE HEALTH INSURANCE

## 2014-03-01 ENCOUNTER — Ambulatory Visit (HOSPITAL_COMMUNITY)
Admission: RE | Admit: 2014-03-01 | Discharge: 2014-03-01 | Disposition: A | Discharge status: Home / Self Care | Payer: PRIVATE HEALTH INSURANCE | Source: Ambulatory Visit | Attending: Internal Medicine | Admitting: Internal Medicine

## 2014-03-01 DIAGNOSIS — C73 Malignant neoplasm of thyroid gland: Secondary | ICD-10-CM | POA: Diagnosis present

## 2014-03-13 ENCOUNTER — Other Ambulatory Visit: Payer: Self-pay | Admitting: Internal Medicine

## 2014-03-13 DIAGNOSIS — C73 Malignant neoplasm of thyroid gland: Secondary | ICD-10-CM

## 2014-04-02 ENCOUNTER — Encounter (HOSPITAL_COMMUNITY)
Admission: RE | Admit: 2014-04-02 | Discharge: 2014-04-02 | Disposition: A | Discharge status: Home / Self Care | Payer: PRIVATE HEALTH INSURANCE | Source: Ambulatory Visit | Attending: Internal Medicine | Admitting: Internal Medicine

## 2014-04-02 DIAGNOSIS — C73 Malignant neoplasm of thyroid gland: Secondary | ICD-10-CM | POA: Diagnosis not present

## 2014-04-02 DIAGNOSIS — Z09 Encounter for follow-up examination after completed treatment for conditions other than malignant neoplasm: Secondary | ICD-10-CM | POA: Diagnosis not present

## 2014-04-02 MED ORDER — THYROTROPIN ALFA 1.1 MG IM SOLR
0.9000 mg | INTRAMUSCULAR | Status: AC
  Administered 2014-04-02: 0.9 mg via INTRAMUSCULAR

## 2014-04-03 ENCOUNTER — Encounter (HOSPITAL_COMMUNITY)
Admission: RE | Admit: 2014-04-03 | Discharge: 2014-04-03 | Disposition: A | Discharge status: Home / Self Care | Payer: PRIVATE HEALTH INSURANCE | Source: Ambulatory Visit | Attending: Internal Medicine | Admitting: Internal Medicine

## 2014-04-03 DIAGNOSIS — C73 Malignant neoplasm of thyroid gland: Secondary | ICD-10-CM | POA: Diagnosis not present

## 2014-04-03 MED ORDER — THYROTROPIN ALFA 1.1 MG IM SOLR
0.9000 mg | INTRAMUSCULAR | Status: AC
  Administered 2014-04-03: 0.9 mg via INTRAMUSCULAR

## 2014-04-04 ENCOUNTER — Encounter (HOSPITAL_COMMUNITY)
Admission: RE | Admit: 2014-04-04 | Discharge: 2014-04-04 | Disposition: A | Discharge status: Home / Self Care | Payer: PRIVATE HEALTH INSURANCE | Source: Ambulatory Visit | Attending: Internal Medicine | Admitting: Internal Medicine

## 2014-04-04 MED ORDER — SODIUM IODIDE I 131 CAPSULE
4.0000 | Freq: Once | INTRAVENOUS | Status: AC | PRN
  Administered 2014-04-04: 4 via ORAL

## 2014-04-06 ENCOUNTER — Encounter (HOSPITAL_COMMUNITY)
Admission: RE | Admit: 2014-04-06 | Discharge: 2014-04-06 | Disposition: A | Discharge status: Home / Self Care | Payer: PRIVATE HEALTH INSURANCE | Source: Ambulatory Visit | Attending: Internal Medicine | Admitting: Internal Medicine

## 2014-04-06 DIAGNOSIS — C73 Malignant neoplasm of thyroid gland: Secondary | ICD-10-CM | POA: Diagnosis not present

## 2014-04-06 MED ORDER — SODIUM IODIDE I 131 CAPSULE: 4.0000 | Freq: Once | INTRAVENOUS | Status: AC | PRN

## 2014-04-08 LAB — THYROGLOBULIN ANTIBODY: Thyroglobulin Ab: <20 [IU]/mL

## 2014-04-08 LAB — THYROGLOBULIN LEVEL: Thyroglobulin: 3.4 ng/mL (ref 0.0–55.0)

## 2014-04-20 ENCOUNTER — Other Ambulatory Visit: Payer: Self-pay | Admitting: Internal Medicine

## 2014-04-20 DIAGNOSIS — C73 Malignant neoplasm of thyroid gland: Secondary | ICD-10-CM

## 2014-04-30 ENCOUNTER — Other Ambulatory Visit: Payer: Self-pay | Admitting: Internal Medicine

## 2014-04-30 ENCOUNTER — Ambulatory Visit (HOSPITAL_COMMUNITY)
Admission: RE | Admit: 2014-04-30 | Discharge: 2014-04-30 | Disposition: A | Discharge status: Home / Self Care | Payer: PRIVATE HEALTH INSURANCE | Source: Ambulatory Visit | Attending: Internal Medicine | Admitting: Internal Medicine

## 2014-04-30 ENCOUNTER — Encounter (HOSPITAL_COMMUNITY)
Admission: RE | Admit: 2014-04-30 | Discharge: 2014-04-30 | Disposition: A | Discharge status: Home / Self Care | Payer: PRIVATE HEALTH INSURANCE | Source: Ambulatory Visit | Attending: Internal Medicine | Admitting: Internal Medicine

## 2014-04-30 DIAGNOSIS — C73 Malignant neoplasm of thyroid gland: Secondary | ICD-10-CM | POA: Diagnosis present

## 2014-04-30 DIAGNOSIS — Z09 Encounter for follow-up examination after completed treatment for conditions other than malignant neoplasm: Secondary | ICD-10-CM | POA: Diagnosis not present

## 2014-04-30 MED ORDER — THYROTROPIN ALFA 1.1 MG IM SOLR
0.9000 mg | INTRAMUSCULAR | Status: AC
  Administered 2014-04-30: 0.9 mg via INTRAMUSCULAR
  Filled 2014-04-30: qty 0.9

## 2014-05-01 ENCOUNTER — Encounter (HOSPITAL_COMMUNITY)
Admission: RE | Admit: 2014-05-01 | Discharge: 2014-05-01 | Disposition: A | Discharge status: Home / Self Care | Payer: PRIVATE HEALTH INSURANCE | Source: Ambulatory Visit | Attending: Internal Medicine | Admitting: Internal Medicine

## 2014-05-01 DIAGNOSIS — C73 Malignant neoplasm of thyroid gland: Secondary | ICD-10-CM | POA: Diagnosis not present

## 2014-05-01 MED ORDER — THYROTROPIN ALFA 1.1 MG IM SOLR
0.9000 mg | INTRAMUSCULAR | Status: AC
  Administered 2014-05-01: 0.9 mg via INTRAMUSCULAR
  Filled 2014-05-01: qty 0.9

## 2014-05-02 ENCOUNTER — Ambulatory Visit (HOSPITAL_COMMUNITY)
Admission: RE | Admit: 2014-05-02 | Discharge: 2014-05-02 | Disposition: A | Discharge status: Home / Self Care | Payer: PRIVATE HEALTH INSURANCE | Source: Ambulatory Visit | Attending: Internal Medicine | Admitting: Internal Medicine

## 2014-05-02 DIAGNOSIS — C73 Malignant neoplasm of thyroid gland: Secondary | ICD-10-CM | POA: Diagnosis not present

## 2014-05-02 MED ORDER — SODIUM IODIDE I 131 CAPSULE
190.8000 | Freq: Once | INTRAVENOUS | Status: AC | PRN
  Administered 2014-05-02: 190.8 via ORAL

## 2014-05-08 ENCOUNTER — Encounter (HOSPITAL_COMMUNITY)
Admission: RE | Admit: 2014-05-08 | Discharge: 2014-05-08 | Disposition: A | Discharge status: Home / Self Care | Payer: PRIVATE HEALTH INSURANCE | Source: Ambulatory Visit | Attending: Internal Medicine | Admitting: Internal Medicine

## 2014-05-08 DIAGNOSIS — C73 Malignant neoplasm of thyroid gland: Secondary | ICD-10-CM | POA: Insufficient documentation

## 2014-08-22 ENCOUNTER — Ambulatory Visit
Admission: RE | Admit: 2014-08-22 | Discharge: 2014-08-22 | Disposition: A | Discharge status: Home / Self Care | Payer: Medicaid Other | Source: Ambulatory Visit | Attending: Internal Medicine | Admitting: Internal Medicine

## 2014-08-22 DIAGNOSIS — Z780 Asymptomatic menopausal state: Secondary | ICD-10-CM | POA: Diagnosis not present

## 2014-08-22 DIAGNOSIS — E2839 Other primary ovarian failure: Secondary | ICD-10-CM

## 2014-08-24 DIAGNOSIS — E890 Postprocedural hypothyroidism: Secondary | ICD-10-CM | POA: Diagnosis not present

## 2014-08-24 DIAGNOSIS — E11329 Type 2 diabetes mellitus with mild nonproliferative diabetic retinopathy without macular edema: Secondary | ICD-10-CM | POA: Diagnosis not present

## 2014-08-24 DIAGNOSIS — C73 Malignant neoplasm of thyroid gland: Secondary | ICD-10-CM | POA: Diagnosis not present

## 2014-08-29 DIAGNOSIS — I10 Essential (primary) hypertension: Secondary | ICD-10-CM | POA: Diagnosis not present

## 2014-08-29 DIAGNOSIS — E784 Other hyperlipidemia: Secondary | ICD-10-CM | POA: Diagnosis not present

## 2014-08-29 DIAGNOSIS — E1165 Type 2 diabetes mellitus with hyperglycemia: Secondary | ICD-10-CM | POA: Diagnosis not present

## 2014-08-29 DIAGNOSIS — D511 Vitamin B12 deficiency anemia due to selective vitamin B12 malabsorption with proteinuria: Secondary | ICD-10-CM | POA: Diagnosis not present

## 2014-08-29 DIAGNOSIS — M12812 Other specific arthropathies, not elsewhere classified, left shoulder: Secondary | ICD-10-CM | POA: Diagnosis not present

## 2014-09-11 ENCOUNTER — Emergency Department (HOSPITAL_COMMUNITY): Payer: Medicare Other

## 2014-09-11 ENCOUNTER — Inpatient Hospital Stay (HOSPITAL_COMMUNITY)
Admission: EM | Admit: 2014-09-11 | Discharge: 2014-09-13 | DRG: 065 | Disposition: A | Discharge status: Home / Self Care | Payer: Medicare Other | Attending: Internal Medicine | Admitting: Internal Medicine

## 2014-09-11 ENCOUNTER — Inpatient Hospital Stay (HOSPITAL_COMMUNITY): Payer: Medicare Other

## 2014-09-11 ENCOUNTER — Encounter (HOSPITAL_COMMUNITY): Payer: Self-pay | Admitting: *Deleted

## 2014-09-11 DIAGNOSIS — Z7982 Long term (current) use of aspirin: Secondary | ICD-10-CM | POA: Diagnosis not present

## 2014-09-11 DIAGNOSIS — Z794 Long term (current) use of insulin: Secondary | ICD-10-CM | POA: Diagnosis not present

## 2014-09-11 DIAGNOSIS — R202 Paresthesia of skin: Secondary | ICD-10-CM

## 2014-09-11 DIAGNOSIS — C641 Malignant neoplasm of right kidney, except renal pelvis: Secondary | ICD-10-CM | POA: Diagnosis present

## 2014-09-11 DIAGNOSIS — E785 Hyperlipidemia, unspecified: Secondary | ICD-10-CM | POA: Diagnosis present

## 2014-09-11 DIAGNOSIS — E119 Type 2 diabetes mellitus without complications: Secondary | ICD-10-CM | POA: Diagnosis not present

## 2014-09-11 DIAGNOSIS — K5730 Diverticulosis of large intestine without perforation or abscess without bleeding: Secondary | ICD-10-CM | POA: Diagnosis not present

## 2014-09-11 DIAGNOSIS — Z79899 Other long term (current) drug therapy: Secondary | ICD-10-CM | POA: Diagnosis not present

## 2014-09-11 DIAGNOSIS — Z6839 Body mass index (BMI) 39.0-39.9, adult: Secondary | ICD-10-CM

## 2014-09-11 DIAGNOSIS — R590 Localized enlarged lymph nodes: Secondary | ICD-10-CM | POA: Diagnosis not present

## 2014-09-11 DIAGNOSIS — I639 Cerebral infarction, unspecified: Secondary | ICD-10-CM | POA: Diagnosis present

## 2014-09-11 DIAGNOSIS — R531 Weakness: Secondary | ICD-10-CM | POA: Diagnosis not present

## 2014-09-11 DIAGNOSIS — G8194 Hemiplegia, unspecified affecting left nondominant side: Secondary | ICD-10-CM | POA: Diagnosis not present

## 2014-09-11 DIAGNOSIS — M5032 Other cervical disc degeneration, mid-cervical region: Secondary | ICD-10-CM | POA: Diagnosis not present

## 2014-09-11 DIAGNOSIS — I10 Essential (primary) hypertension: Secondary | ICD-10-CM | POA: Diagnosis not present

## 2014-09-11 DIAGNOSIS — N2889 Other specified disorders of kidney and ureter: Secondary | ICD-10-CM | POA: Diagnosis not present

## 2014-09-11 DIAGNOSIS — E669 Obesity, unspecified: Secondary | ICD-10-CM | POA: Diagnosis present

## 2014-09-11 DIAGNOSIS — R200 Anesthesia of skin: Secondary | ICD-10-CM

## 2014-09-11 DIAGNOSIS — I638 Other cerebral infarction: Principal | ICD-10-CM | POA: Diagnosis present

## 2014-09-11 DIAGNOSIS — K862 Cyst of pancreas: Secondary | ICD-10-CM | POA: Diagnosis not present

## 2014-09-11 DIAGNOSIS — M1990 Unspecified osteoarthritis, unspecified site: Secondary | ICD-10-CM

## 2014-09-11 DIAGNOSIS — I270 Primary pulmonary hypertension: Secondary | ICD-10-CM | POA: Diagnosis not present

## 2014-09-11 DIAGNOSIS — Z823 Family history of stroke: Secondary | ICD-10-CM | POA: Diagnosis not present

## 2014-09-11 DIAGNOSIS — R062 Wheezing: Secondary | ICD-10-CM

## 2014-09-11 DIAGNOSIS — R51 Headache: Secondary | ICD-10-CM | POA: Diagnosis not present

## 2014-09-11 DIAGNOSIS — Z85850 Personal history of malignant neoplasm of thyroid: Secondary | ICD-10-CM

## 2014-09-11 DIAGNOSIS — I6330 Cerebral infarction due to thrombosis of unspecified cerebral artery: Secondary | ICD-10-CM | POA: Diagnosis not present

## 2014-09-11 DIAGNOSIS — E118 Type 2 diabetes mellitus with unspecified complications: Secondary | ICD-10-CM | POA: Diagnosis not present

## 2014-09-11 DIAGNOSIS — E890 Postprocedural hypothyroidism: Secondary | ICD-10-CM | POA: Diagnosis present

## 2014-09-11 DIAGNOSIS — J9811 Atelectasis: Secondary | ICD-10-CM | POA: Diagnosis not present

## 2014-09-11 DIAGNOSIS — M5021 Other cervical disc displacement,  high cervical region: Secondary | ICD-10-CM | POA: Diagnosis not present

## 2014-09-11 DIAGNOSIS — M4802 Spinal stenosis, cervical region: Secondary | ICD-10-CM | POA: Diagnosis not present

## 2014-09-11 HISTORY — DX: Cerebral infarction, unspecified: I63.9

## 2014-09-11 HISTORY — DX: Unspecified osteoarthritis, unspecified site: M19.90

## 2014-09-11 LAB — I-STAT CHEM 8, ED
BUN: 22 mg/dL (ref 6–23)
Calcium, Ion: 1.09 mmol/L — ABNORMAL LOW (ref 1.13–1.30)
Chloride: 105 mmol/L (ref 96–112)
Creatinine, Ser: 0.7 mg/dL (ref 0.50–1.10)
Glucose, Bld: 209 mg/dL — ABNORMAL HIGH (ref 70–99)
HCT: 42 % (ref 36.0–46.0)
Hemoglobin: 14.3 g/dL (ref 12.0–15.0)
Potassium: 3.9 mmol/L (ref 3.5–5.1)
Sodium: 142 mmol/L (ref 135–145)
TCO2: 22 mmol/L (ref 0–100)

## 2014-09-11 LAB — COMPREHENSIVE METABOLIC PANEL
ALT: 20 U/L (ref 0–35)
AST: 25 U/L (ref 0–37)
Albumin: 3.5 g/dL (ref 3.5–5.2)
Alkaline Phosphatase: 56 U/L (ref 39–117)
Anion gap: 7 (ref 5–15)
BUN: 17 mg/dL (ref 6–23)
CO2: 25 mmol/L (ref 19–32)
Calcium: 8.9 mg/dL (ref 8.4–10.5)
Chloride: 107 mmol/L (ref 96–112)
Creatinine, Ser: 0.84 mg/dL (ref 0.50–1.10)
GFR calc Af Amer: 76 mL/min — ABNORMAL LOW (ref >90)
GFR calc non Af Amer: 66 mL/min — ABNORMAL LOW (ref >90)
Glucose, Bld: 210 mg/dL — ABNORMAL HIGH (ref 70–99)
Potassium: 3.7 mmol/L (ref 3.5–5.1)
Sodium: 139 mmol/L (ref 135–145)
Total Bilirubin: 0.7 mg/dL (ref 0.3–1.2)
Total Protein: 7.5 g/dL (ref 6.0–8.3)

## 2014-09-11 LAB — GLUCOSE, CAPILLARY
Glucose-Capillary: 177 mg/dL — ABNORMAL HIGH (ref 70–99)
Glucose-Capillary: 300 mg/dL — ABNORMAL HIGH (ref 70–99)
Glucose-Capillary: 224 mg/dL — ABNORMAL HIGH (ref 70–99)

## 2014-09-11 LAB — CBC WITH DIFFERENTIAL/PLATELET
Basophils Absolute: 0 10*3/uL (ref 0.0–0.1)
Basophils Relative: 1 % (ref 0–1)
Eosinophils Absolute: 0.1 10*3/uL (ref 0.0–0.7)
Eosinophils Relative: 1 % (ref 0–5)
HCT: 38.4 % (ref 36.0–46.0)
Hemoglobin: 12.7 g/dL (ref 12.0–15.0)
Lymphocytes Relative: 36 % (ref 12–46)
Lymphs Abs: 1.9 10*3/uL (ref 0.7–4.0)
MCH: 30.8 pg (ref 26.0–34.0)
MCHC: 33.1 g/dL (ref 30.0–36.0)
MCV: 93.2 fL (ref 78.0–100.0)
Monocytes Absolute: 0.5 10*3/uL (ref 0.1–1.0)
Monocytes Relative: 9 % (ref 3–12)
Neutro Abs: 2.8 10*3/uL (ref 1.7–7.7)
Neutrophils Relative %: 53 % (ref 43–77)
Platelets: 212 10*3/uL (ref 150–400)
RBC: 4.12 MIL/uL (ref 3.87–5.11)
RDW: 14.5 % (ref 11.5–15.5)
WBC: 5.3 10*3/uL (ref 4.0–10.5)

## 2014-09-11 LAB — URINALYSIS, ROUTINE W REFLEX MICROSCOPIC
Bilirubin Urine: NEGATIVE
Glucose, UA: NEGATIVE mg/dL
Hgb urine dipstick: NEGATIVE
Ketones, ur: NEGATIVE mg/dL
Leukocytes, UA: NEGATIVE
Nitrite: NEGATIVE
Protein, ur: NEGATIVE mg/dL
Specific Gravity, Urine: 1.025 (ref 1.005–1.030)
Urobilinogen, UA: 0.2 mg/dL (ref 0.0–1.0)
pH: 5 (ref 5.0–8.0)

## 2014-09-11 LAB — RAPID URINE DRUG SCREEN, HOSP PERFORMED
Amphetamines: NOT DETECTED
Barbiturates: NOT DETECTED
Benzodiazepines: NOT DETECTED
Cocaine: NOT DETECTED
Opiates: NOT DETECTED
Tetrahydrocannabinol: NOT DETECTED

## 2014-09-11 LAB — I-STAT TROPONIN, ED: Troponin i, poc: 0.02 ng/mL (ref 0.00–0.08)

## 2014-09-11 LAB — CBG MONITORING, ED: Glucose-Capillary: 181 mg/dL — ABNORMAL HIGH (ref 70–99)

## 2014-09-11 LAB — ETHANOL: Alcohol, Ethyl (B): <5 mg/dL (ref 0–9)

## 2014-09-11 LAB — APTT: aPTT: 33 seconds (ref 24–37)

## 2014-09-11 LAB — PROTIME-INR
INR: 1.13 (ref 0.00–1.49)
Prothrombin Time: 14.7 seconds (ref 11.6–15.2)

## 2014-09-11 MED ORDER — STROKE: EARLY STAGES OF RECOVERY BOOK: Freq: Once | Status: DC

## 2014-09-11 MED ORDER — LORAZEPAM 2 MG/ML IJ SOLN: 0.5000 mg | Freq: Once | INTRAMUSCULAR | Status: DC

## 2014-09-11 MED ORDER — IOHEXOL 300 MG/ML  SOLN
25.0000 mL | INTRAMUSCULAR | Status: AC
  Administered 2014-09-11 (×2): 25 mL via ORAL

## 2014-09-11 MED ORDER — METOPROLOL TARTRATE 100 MG PO TABS
100.0000 mg | ORAL_TABLET | Freq: Two times a day (BID) | ORAL | Status: DC
  Administered 2014-09-11 – 2014-09-13 (×4): 100 mg via ORAL
  Filled 2014-09-11 (×5): qty 2

## 2014-09-11 MED ORDER — SENNOSIDES-DOCUSATE SODIUM 8.6-50 MG PO TABS: 1.0000 | ORAL_TABLET | Freq: Every evening | ORAL | Status: DC | PRN

## 2014-09-11 MED ORDER — ASPIRIN 300 MG RE SUPP: 300.0000 mg | Freq: Every day | RECTAL | Status: DC

## 2014-09-11 MED ORDER — ACETAMINOPHEN 325 MG PO TABS
650.0000 mg | ORAL_TABLET | Freq: Four times a day (QID) | ORAL | Status: DC | PRN
  Administered 2014-09-13: 650 mg via ORAL
  Filled 2014-09-11: qty 2

## 2014-09-11 MED ORDER — ASPIRIN 325 MG PO TABS
325.0000 mg | ORAL_TABLET | Freq: Every day | ORAL | Status: DC
  Administered 2014-09-12 – 2014-09-13 (×2): 325 mg via ORAL
  Filled 2014-09-11 (×2): qty 1

## 2014-09-11 MED ORDER — HEPARIN SODIUM (PORCINE) 5000 UNIT/ML IJ SOLN
5000.0000 [IU] | Freq: Three times a day (TID) | INTRAMUSCULAR | Status: DC
  Administered 2014-09-11 – 2014-09-13 (×6): 5000 [IU] via SUBCUTANEOUS
  Filled 2014-09-11 (×6): qty 1

## 2014-09-11 MED ORDER — LOSARTAN POTASSIUM-HCTZ 100-25 MG PO TABS: 1.0000 | ORAL_TABLET | Freq: Every day | ORAL | Status: DC

## 2014-09-11 MED ORDER — DILTIAZEM HCL ER BEADS 240 MG PO CP24
360.0000 mg | ORAL_CAPSULE | Freq: Every day | ORAL | Status: DC
  Administered 2014-09-12 – 2014-09-13 (×2): 360 mg via ORAL
  Filled 2014-09-11 (×3): qty 1

## 2014-09-11 MED ORDER — LOSARTAN POTASSIUM 50 MG PO TABS
100.0000 mg | ORAL_TABLET | Freq: Every day | ORAL | Status: DC
  Administered 2014-09-12: 100 mg via ORAL
  Filled 2014-09-11: qty 2

## 2014-09-11 MED ORDER — HYDRALAZINE HCL 20 MG/ML IJ SOLN
5.0000 mg | Freq: Once | INTRAMUSCULAR | Status: AC
  Administered 2014-09-11: 5 mg via INTRAVENOUS
  Filled 2014-09-11: qty 1

## 2014-09-11 MED ORDER — INSULIN ASPART 100 UNIT/ML ~~LOC~~ SOLN
4.0000 [IU] | Freq: Three times a day (TID) | SUBCUTANEOUS | Status: DC
  Administered 2014-09-11 – 2014-09-13 (×6): 4 [IU] via SUBCUTANEOUS

## 2014-09-11 MED ORDER — LEVOTHYROXINE SODIUM 100 MCG PO TABS
200.0000 ug | ORAL_TABLET | Freq: Every day | ORAL | Status: DC
  Administered 2014-09-12 – 2014-09-13 (×2): 200 ug via ORAL
  Filled 2014-09-11 (×2): qty 2

## 2014-09-11 MED ORDER — HYDROCHLOROTHIAZIDE 25 MG PO TABS
25.0000 mg | ORAL_TABLET | Freq: Every day | ORAL | Status: DC
  Administered 2014-09-12: 25 mg via ORAL
  Filled 2014-09-11: qty 1

## 2014-09-11 MED ORDER — INSULIN ASPART 100 UNIT/ML ~~LOC~~ SOLN
0.0000 [IU] | Freq: Three times a day (TID) | SUBCUTANEOUS | Status: DC
  Administered 2014-09-12 (×2): 5 [IU] via SUBCUTANEOUS
  Administered 2014-09-12: 3 [IU] via SUBCUTANEOUS
  Administered 2014-09-13: 2 [IU] via SUBCUTANEOUS
  Administered 2014-09-13: 5 [IU] via SUBCUTANEOUS

## 2014-09-11 MED ORDER — INSULIN ASPART 100 UNIT/ML ~~LOC~~ SOLN
0.0000 [IU] | Freq: Every day | SUBCUTANEOUS | Status: DC
  Administered 2014-09-11: 4 [IU] via SUBCUTANEOUS

## 2014-09-11 MED ORDER — IRBESARTAN 150 MG PO TABS
150.0000 mg | ORAL_TABLET | Freq: Every day | ORAL | Status: DC
  Administered 2014-09-12: 150 mg via ORAL
  Filled 2014-09-11: qty 1

## 2014-09-11 MED ORDER — CANAGLIFLOZIN 100 MG PO TABS
100.0000 mg | ORAL_TABLET | Freq: Every day | ORAL | Status: DC
  Administered 2014-09-12: 100 mg via ORAL
  Filled 2014-09-11 (×3): qty 1

## 2014-09-11 NOTE — ED Notes (Signed)
Checked patient blood sugar it was 181

## 2014-09-11 NOTE — Progress Notes (Signed)
Pt received at 16:28pm alert, verbal with no noted distress. Stable, neuro intact.  Denied pain or discomfort. Able to follow commands. Ambulated to the bathroom, standby assist. Steady gait. Pt oriented and educated to room. Safety measures in place. Daughter at bedside. Call bell within reach. Will continue to monitor. Received report from ED nurse Elmyra Ricks.

## 2014-09-11 NOTE — H&P (Signed)
[facility] History and Physical                                                                                    Patient Demographics  Kaylee Mullins, is a 79 y.o. female  MRN: [id number]   DOB - 1936/03/14  Admit Date - 09/11/2014  Outpatient Primary MD for the patient is Philis Fendt, MD   With History of -  Past Medical History  Diagnosis Date  . Hypertension   . Hypothyroidism   . Diabetes mellitus     type !  . Cancer     thyroid cancer      Past Surgical History  Procedure Laterality Date  . Thyroidectomy  2005  . Abdominal hysterectomy    . Tonsillectomy    . Lumbar spine surgery    . Flexible sigmoidoscopy  03/29/2012    Procedure: FLEXIBLE SIGMOIDOSCOPY;  Surgeon: Jeryl Columbia, MD;  Location: [facility] ENDOSCOPY;  Service: Endoscopy;  Laterality: N/A;  fleet enema upon arrival  . Hot hemostasis  03/29/2012    Procedure: HOT HEMOSTASIS (ARGON PLASMA COAGULATION/BICAP);  Surgeon: Jeryl Columbia, MD;  Location: [facility] ENDOSCOPY;  Service: Endoscopy;  Laterality: N/A;    in for   Chief Complaint  Patient presents with  . Numbness  . Hypertension     HPI  Kaylee Mullins  is a 79 y.o. female, who presented to the ER with complaints of left-sided severe headache with numbness and tingling of the left side onset around 1 am. Patient reported she had been up on all night long without any improvement in symptoms. Also had noticed some blurring of vision bilaterally without any symptoms consistent with amaurosis fugax. By the time she presented to the ER her visual disturbance had resolved. She was reporting difficulty ambulating because of the tingling in her leg but did not feel like she was going to fall and had no near syncopal symptoms. Upon arrival to the emergency department she was afebrile, not hypoxic, heart rate 61 and blood pressure was 185/90. CT of the head performed in the ER was unremarkable. Her serum glucose was elevated at 210 otherwise CBC, electrolytes  panel and urinalysis was unremarkable. Urine drug screen was negative. EKG revealed sinus bradycardia with sinus arrhythmia without any acute ischemic changes.  @@Please  note that patient arrived more than 11 hours after onset of symptoms so she is outside of window for TPA administration  Of note she reports that her insulin had recently been adjusted from the NovoLog flex pen to Humulin 500 concentrated insulin 20 units twice a day. About one week prior because of uncontrolled hypertension Micardis was added by her primary care physician. In the ER patient showed me samples of this medication. Since admission her symptoms have not improved but they have also not worsened. Headache still persists.  Review of Systems    In addition to the HPI above,  No Fever-chills, Positive for Headache, No changes in hearing, preadmission diplopia has resolved; continues to have numbness and tingling of the left upper and lower extremities with some weakness primarily in the left upper extremity which is very mild No problems swallowing  food or Liquids, No Chest pain, Cough or Shortness of Breath, No Abdominal pain, No Nausea or Vomiting, Bowel movements are regular, No Blood in stool or Urine, No dysuria, No new skin rashes or bruises, No new joints pains-aches,  No recent weight gain or loss, No polyuria, polydypsia or polyphagia, No significant Mental Stressors.  A full 10 point Review of Systems was done, except as stated above, all other Review of Systems were negative.   Social History History  Substance Use Topics  . Smoking status: Never Smoker   . Smokeless tobacco: None  . Alcohol Use: Never   Lives alone, still drives, separated from spouse for 32 years  CODE STATUS Full code   Family History Family History  Problem Relation Age of Onset  . Heart failure Mother   . Heart attack Father (deceased )   CVA, diabetes mellitus, MI                                       Sister  (deceased) Brain cancer                                                             Brother (deceased)   Prior to Admission medications   Medication Sig Start Date End Date Taking? Authorizing Provider  Canagliflozin (INVOKANA) 100 MG TABS Take 100 mg by mouth daily.    Historical Provider, MD  diltiazem (TIAZAC) 360 MG 24 hr capsule Take 360 mg by mouth daily.    Historical Provider, MD  insulin aspart (NOVOLOG) 100 UNIT/ML injection  @@Patient  reports is now on Humulin 500 unit Inject into the skin 2 (two) times daily with a meal.  Reports 20 units with breakfast and supper @@needs  to be confirmed by pharmacist     Historical Provider, MD  levothyroxine (SYNTHROID, LEVOTHROID) 200 MCG tablet Take 200 mcg by mouth daily.    Historical Provider, MD  losartan-hydrochlorothiazide (HYZAAR) 100-25 MG per tablet Take 1 tablet by mouth daily.    Historical Provider, MD  metoprolol (LOPRESSOR) 100 MG tablet Take 100 mg by mouth 2 (two) times daily.    Historical Provider, MD  traMADol (ULTRAM) 50 MG tablet Take 1 tablet (50 mg total) by mouth every 6 (six) hours as needed. 06/30/13   Janne Napoleon, NP  traMADol (ULTRAM) 50 MG tablet      Micardis 40 mg tablet Take 1 tablet (50 mg total) by mouth every 6 (six) hours as needed. For pain.   1 tablet daily for blood pressure  06/30/13   Janne Napoleon, NP    Allergies  Allergen Reactions  . Nyquil Multi-Symptom [Pseudoeph-Doxylamine-Dm-Apap] Other (See Comments)    Makes pt not "feel right in her head"  . Darvon [Propoxyphene Hcl]     hallucinations    Physical Exam  Vitals  Blood pressure 188/74, pulse 54, temperature 98.6 F (37 C), temperature source Oral, resp. rate 20, height 5\' 4"  (1.626 m), weight 231 lb (104.781 kg), SpO2 96 %.   General:  lying on stretcher in the emergency department, in NAD,   Psych:  Normal affect and insight, Not Suicidal or Homicidal, Awake Alert, Oriented X 3.  Neuro:   Endorses decreased sensation  upper  and lower extremities and somewhat on the left side of the face, ALL C.Nerves Intact with tongue midline, subtle right facial droop Strength 5/5 right upper and lower extremities and left lower extremity-left upper extremity slightly weaker at 4 over 5 primarily with extensor resistance and shoulder shrug. Able to lift legs easily off the bed, no obvious pronator drift  ENT:  Ears and Eyes appear Normal, Conjunctivae clear, PERRLA. Moist Oral Mucosa.  Neck:  Supple Neck, No JVD, No cervical lymphadenopathy appreciated  Respiratory:  Symmetrical Chest wall movement, Good air movement bilaterally, CTAB.  Cardiac:  RRR, No Gallops, Rubs or Murmurs, No Parasternal Heave.  Abdomen:  Positive Bowel Sounds, Abdomen Soft, Non tender, No organomegaly appriciated  Skin:  No Cyanosis, Normal Skin Turgor, No Skin Rash or Bruise.  Extremities:  Good muscle tone,  joints appear normal , no effusions, Normal ROM.   Data Review  CBC  Recent Labs Lab 09/11/14 1138 09/11/14 1145  WBC 5.3  --   HGB 12.7 14.3  HCT 38.4 42.0  PLT 212  --   MCV 93.2  --   MCH 30.8  --   MCHC 33.1  --   RDW 14.5  --   LYMPHSABS 1.9  --   MONOABS 0.5  --   EOSABS 0.1  --   BASOSABS 0.0  --    ------------------------------------------------------------------------------------------------------------------  Chemistries   Recent Labs Lab 09/11/14 1138 09/11/14 1145  NA 139 142  K 3.7 3.9  CL 107 105  CO2 25  --   GLUCOSE 210* 209*  BUN 17 22  CREATININE 0.84 0.70  CALCIUM 8.9  --   AST 25  --   ALT 20  --   ALKPHOS 56  --   BILITOT 0.7  --    ------------------------------------------------------------------------------------------------------------------ estimated creatinine clearance is 70.6 mL/min (by C-G formula based on Cr of 0.7). ------------------------------------------------------------------------------------------------------------------ No results for input(s): TSH, T4TOTAL, T3FREE,  THYROIDAB in the last 72 hours.  Invalid input(s): FREET3   Coagulation profile  Recent Labs Lab 09/11/14 1138  INR 1.13   ------------------------------------------------------------------------------------------------------------------- No results for input(s): DDIMER in the last 72 hours. -------------------------------------------------------------------------------------------------------------------  Cardiac Enzymes No results for input(s): CKMB, TROPONINI, MYOGLOBIN in the last 168 hours.  Invalid input(s): CK ------------------------------------------------------------------------------------------------------------------ Invalid input(s): POCBNP   ---------------------------------------------------------------------------------------------------------------  Urinalysis    Component Value Date/Time   COLORURINE YELLOW 09/11/2014 1158   APPEARANCEUR CLEAR 09/11/2014 1158   LABSPEC 1.025 09/11/2014 1158   PHURINE 5.0 09/11/2014 1158   GLUCOSEU NEGATIVE 09/11/2014 1158   HGBUR NEGATIVE 09/11/2014 1158   BILIRUBINUR NEGATIVE 09/11/2014 1158   KETONESUR NEGATIVE 09/11/2014 1158   PROTEINUR NEGATIVE 09/11/2014 1158   UROBILINOGEN 0.2 09/11/2014 1158   NITRITE NEGATIVE 09/11/2014 1158   LEUKOCYTESUR NEGATIVE 09/11/2014 1158    ----------------------------------------------------------------------------------------------------------------  Imaging results:   Ct Head Wo Contrast  09/11/2014   CLINICAL DATA:  79 year old female with history diabetes and high blood pressure presenting with headache and left-sided weakness. Initial encounter.  EXAM: CT HEAD WITHOUT CONTRAST  TECHNIQUE: Contiguous axial images were obtained from the base of the skull through the vertex without intravenous contrast.  COMPARISON:  No comparison head CT.  FINDINGS: No intracranial hemorrhage.  Small vessel disease type changes without CT evidence of large acute infarct.  No  hydrocephalus.  No intracranial mass lesion noted on this unenhanced exam.  Vascular calcifications.  Mastoid air cells, middle ear cavities and visualized paranasal sinuses are clear.  Orbital structures unremarkable.  IMPRESSION: No intracranial hemorrhage or CT evidence of large acute infarct.  Small vessel disease type changes.  Please see above.   Electronically Signed   By: Chauncey Cruel M.D.   On: 09/11/2014 12:22   Dg Bone Density  08/22/2014   CLINICAL DATA:  Postmenopausal. Patient takes vitamin-D.  EXAM: DUAL X-RAY ABSORPTIOMETRY (DXA) FOR BONE MINERAL DENSITY  FINDINGS: AP LUMBAR SPINE (L1-L3)  Bone Mineral Density (BMD):  1.515 G/cm2  Young Adult T-Score:  4.5  Z-Score:  6.3  LEFT FEMUR NECK  Bone Mineral Density (BMD):  1.089 g/cm2  Young Adult T-Score: 2.2  Z-Score:  2.6  ASSESSMENT: Patient's diagnostic category is NORMAL by WHO Criteria.  FRACTURE RISK: NOT INCREASED  FRAX: World Health Organization FRAX assessment of absolute fracture risk is not calculated for this patient because the patient has all T-scores at or above -1.0.  COMPARISON: None.  Effective therapies are available in the form of bisphosphonates, selective estrogen receptor modulators, biologic agents, and hormone replacement therapy (for women). All patients should ensure an adequate intake of dietary calcium (1200 mg daily) and vitamin D (800 IU daily) unless contraindicated.  All treatment decisions require clinical judgment and consideration of individual patient factors, including patient preferences, co-morbidities, previous drug use, risk factors not captured in the FRAX model (e.g., frailty, falls, vitamin D deficiency, increased bone turnover, interval significant decline in bone density) and possible under- or over-estimation of fracture risk by FRAX.  The National Osteoporosis Foundation recommends that FDA-approved medical therapies be considered in postmenopausal women and men age 65 or older with a:  1. Hip or  vertebral (clinical or morphometric) fracture.  2. T-score of -2.5 or lower at the spine or hip.  3. Ten-year fracture probability by FRAX of 3% or greater for hip fracture or 20% or greater for major osteoporotic fracture.  People with diagnosed cases of osteoporosis or at high risk for fracture should have regular bone mineral density tests. For patients eligible for Medicare, routine testing is allowed once every 2 years. The testing frequency can be increased to one year for patients who have rapidly progressing disease, those who are receiving or discontinuing medical therapy to restore bone mass, or have additional risk factors.  World Pharmacologist [facility]) Criteria:  Normal: T-scores from +1.0 to -1.0  Low Bone Mass (Osteopenia): T-scores between -1.0 and -2.5  Osteoporosis: T-scores -2.5 and below  Comparison to Reference Population:  T-score is the key measure used in the diagnosis of osteoporosis and relative risk determination for fracture. It provides a value for bone mass relative to the mean bone mass of a young adult reference population expressed in terms of standard deviation (SD).  Z-score is the age-matched score showing the patient's values compared to a population matched for age, sex, and race. This is also expressed in terms of standard deviation. The patient may have values that compare favorably to the age-matched values and still be at increased risk for fracture.   Electronically Signed   By: Lillia Mountain M.D.   On: 08/22/2014 13:01    My personal review of EKG: Rhythm SB, Rate  54 /min, QTc 434 , no Acute ST changes    Assessment & Plan  Active Problems:   CVA (cerebral infarction) -Initial CT negative; routine stroke admission orders to include MRI/MRA brain, carotid duplex and 2-D echocardiogram -Neurology formally consulted; patient outside time window to proceed with TPA intervention -Admit to neuro telemetry bed with frequent neurological checks -Check lipid panel  and hemoglobin A1c -Begin anti-Antiplatelet agent with aspirin -Was on statin prior to admission -Mobilize with assistance -SLP,PT and OT evaluations per stroke protocol -No hemorrhagic changes on initial CT so appropriate to institute pharmacological DVT prophylaxis    Uncontrolled hypertension -Continue home medications noting therapeutic substitutions -During first 24-48 hours of stroke avoid overcorrection so would not allow systolic blood pressure to get below 503 or diastolic below 70    Diabetes mellitus -Asking pharmacy to clarify concentrated insulin and dosage before ordering; patient did receive a.m. dose earlier today -CBG checks before meals and hour of sleep -Meal coverage with 4 units subcutaneous -Moderate sliding scale insulin    Postsurgical hypothyroidism -Continue home dose Synthroid    Dyslipidemia -Continue Crestor    Osteoarthritis       DVT Prophylaxis:Heparin   AM Labs Ordered, also please review Full Orders  Family Communication:   Daughter at bedside   Code Status:  Full Likely DC to  Home  Condition:  Stable  Time spent in minutes : 60    ELLIS,ALLISON L. ANP on 09/11/2014 at 3:40 PM  Between 7am to 7pm - Pager: [phone]  After 7pm go to [email or web address] - password [email or web address]  And look for the night coverage person covering me after hours  [facility] Office  [phone]   I have directly reviewed the clinical findings, lab results and imaging studies. I have interviewed and examined the patient and agree with the documentation and management as recorded by the Physician extender.  Patient reported blood pressure and blood sugar has been not well controlled recently for unclear reason, pmd is in the process of adjusting her bp meds with adding micardis to her regimen. Patient seems to have very good understanding about her disease process and reported compliant with diet and meds. Symptom is concerning for CVA, mri pending,  neurology consulted, need titrate bp/dm meds to achieve goal.  Meg Niemeier M.D on 09/11/2014 at [facility] Office  [phone]

## 2014-09-11 NOTE — ED Notes (Signed)
Admitting at bedside 

## 2014-09-11 NOTE — ED Notes (Signed)
Pt in CT.

## 2014-09-11 NOTE — ED Notes (Signed)
Pt continues to have less sensation on the right side.

## 2014-09-11 NOTE — Consult Note (Signed)
Referring Physician: plunkett    Chief Complaint: left arm and leg decreased sensation in setting of HTN  HPI:                                                                                                                                         Kaylee Mullins is an 79 y.o. female presenting to the ED due to 2-3 days of left arm decreased sensation.  She states she first noted that the lateral aspect of her left arm and medial aspect of her left forearm along with her left hand felt "tingly".  Over the course of one day she then noted the lateral aspect of her left leg and left calf felt decreased in sensation. Today she felt her BP was elevated and she developed a HA over the left temple with flashing lights in her visual field bilaterally.  She took her BP which showed a systolic BP was 601 and 093. She felt her temple and noted her "blood vessels were pulsating".  Currently her HA has reduced significantly and she continues to have decreased sensation in her left arm and leg as described above.   Date last known well: Date: 09/09/2014 Time last known well: Unable to determine tPA Given: No: out of the window  Past Medical History  Diagnosis Date  . Hypertension   . Hypothyroidism   . Diabetes mellitus     type !  . Cancer     thyroid cancer    Past Surgical History  Procedure Laterality Date  . Thyroidectomy  2005  . Abdominal hysterectomy    . Tonsillectomy    . Lumbar spine surgery    . Flexible sigmoidoscopy  03/29/2012    Procedure: FLEXIBLE SIGMOIDOSCOPY;  Surgeon: Jeryl Columbia, MD;  Location: [facility] ENDOSCOPY;  Service: Endoscopy;  Laterality: N/A;  fleet enema upon arrival  . Hot hemostasis  03/29/2012    Procedure: HOT HEMOSTASIS (ARGON PLASMA COAGULATION/BICAP);  Surgeon: Jeryl Columbia, MD;  Location: [facility] ENDOSCOPY;  Service: Endoscopy;  Laterality: N/A;    Family History  Problem Relation Age of Onset  . Heart failure Mother   . Heart attack Father    Social History:   reports that she has never smoked. She does not have any smokeless tobacco history on file. She reports that she does not drink alcohol or use illicit drugs.  Allergies:  Allergies  Allergen Reactions  . Nyquil Multi-Symptom [Pseudoeph-Doxylamine-Dm-Apap] Other (See Comments)    Makes pt not "feel right in her head"  . Darvon [Propoxyphene Hcl]     hallucinations    Medications:  No current facility-administered medications for this encounter.   Current Outpatient Prescriptions  Medication Sig Dispense Refill  . Canagliflozin (INVOKANA) 100 MG TABS Take 100 mg by mouth daily.    Marland Kitchen diltiazem (TIAZAC) 360 MG 24 hr capsule Take 360 mg by mouth daily.    . insulin aspart (NOVOLOG) 100 UNIT/ML injection Inject into the skin 2 (two) times daily with a meal.    . levothyroxine (SYNTHROID, LEVOTHROID) 200 MCG tablet Take 200 mcg by mouth daily.    Marland Kitchen losartan-hydrochlorothiazide (HYZAAR) 100-25 MG per tablet Take 1 tablet by mouth daily.    . metoprolol (LOPRESSOR) 100 MG tablet Take 100 mg by mouth 2 (two) times daily.    . traMADol (ULTRAM) 50 MG tablet Take 1 tablet (50 mg total) by mouth every 6 (six) hours as needed. 15 tablet 0  . traMADol (ULTRAM) 50 MG tablet Take 1 tablet (50 mg total) by mouth every 6 (six) hours as needed. For pain. 15 tablet 0     ROS:                                                                                                                                       History obtained from the patient  General ROS: negative for - chills, fatigue, fever, night sweats, weight gain or weight loss Psychological ROS: negative for - behavioral disorder, hallucinations, memory difficulties, mood swings or suicidal ideation Ophthalmic ROS: negative for - blurry vision, double vision, eye pain or loss of vision ENT ROS: negative for - epistaxis, nasal  discharge, oral lesions, sore throat, tinnitus or vertigo Allergy and Immunology ROS: negative for - hives or itchy/watery eyes Hematological and Lymphatic ROS: negative for - bleeding problems, bruising or swollen lymph nodes Endocrine ROS: negative for - galactorrhea, hair pattern changes, polydipsia/polyuria or temperature intolerance Respiratory ROS: negative for - cough, hemoptysis, shortness of breath or wheezing Cardiovascular ROS: negative for - chest pain, dyspnea on exertion, edema or irregular heartbeat Gastrointestinal ROS: negative for - abdominal pain, diarrhea, hematemesis, nausea/vomiting or stool incontinence Genito-Urinary ROS: negative for - dysuria, hematuria, incontinence or urinary frequency/urgency Musculoskeletal ROS: negative for - joint swelling or muscular weakness Neurological ROS: as noted in HPI Dermatological ROS: negative for rash and skin lesion changes  Neurologic Examination:                                                                                                      Blood pressure 188/74, pulse 54, temperature 98.6  F (37 C), temperature source Oral, resp. rate 20, height 5\' 4"  (1.626 m), weight 104.781 kg (231 lb), SpO2 96 %.  HEENT-  Normocephalic, no lesions, without obvious abnormality.  Normal external eye and conjunctiva.  Normal TM's bilaterally.  Normal auditory canals and external ears. Normal external nose, mucus membranes and septum.  Normal pharynx. Cardiovascular- S1, S2 normal, pulses palpable throughout   Lungs- no tachypnea, retractions or cyanosis Abdomen- normal findings: bowel sounds normal Extremities- no edema Lymph-no adenopathy palpable Musculoskeletal-no joint tenderness, deformity or swelling Skin-warm and dry, no hyperpigmentation, vitiligo, or suspicious lesions  Neurological Examination Mental Status: Alert, oriented, thought content appropriate.  Speech fluent without evidence of aphasia.  Able to follow 3 step  commands without difficulty. Cranial Nerves: II: Discs flat bilaterally; Visual fields grossly normal, pupils equal, round, reactive to light and accommodation III,IV, VI: ptosis not present, extra-ocular motions intact bilaterally V,VII: smile asymmetric on the RIGHT, facial light touch sensation normal bilaterally VIII: hearing normal bilaterally IX,X: gag reflex present XI: bilateral shoulder shrug XII: midline tongue extension Motor: Right : Upper extremity   5/5    Left:     Upper extremity   5/5 (limited by pain)  Lower extremity   5/5     Lower extremity   5/5 (limited by pain)  Tone and bulk:normal tone throughout; no atrophy noted Sensory: decreased on the lateral upper arm, medical forearm and hand on the left, decreased on the left lateral thigh and calf into her small toe.  Deep Tendon Reflexes: 2+ and symmetric throughout UE, 1+ in KJ and no AJ Plantars: Right: downgoing   Left: downgoing Cerebellar: normal finger-to-nose, normal rapid alternating movements and normal heel-to-shin test Gait: not tested due to multiple leads.        Lab Results: Basic Metabolic Panel:  Recent Labs Lab 09/11/14 1138 09/11/14 1145  NA 139 142  K 3.7 3.9  CL 107 105  CO2 25  --   GLUCOSE 210* 209*  BUN 17 22  CREATININE 0.84 0.70  CALCIUM 8.9  --     Liver Function Tests:  Recent Labs Lab 09/11/14 1138  AST 25  ALT 20  ALKPHOS 56  BILITOT 0.7  PROT 7.5  ALBUMIN 3.5   No results for input(s): LIPASE, AMYLASE in the last 168 hours. No results for input(s): AMMONIA in the last 168 hours.  CBC:  Recent Labs Lab 09/11/14 1138 09/11/14 1145  WBC 5.3  --   NEUTROABS 2.8  --   HGB 12.7 14.3  HCT 38.4 42.0  MCV 93.2  --   PLT 212  --     Cardiac Enzymes: No results for input(s): CKTOTAL, CKMB, CKMBINDEX, TROPONINI in the last 168 hours.  Lipid Panel: No results for input(s): CHOL, TRIG, HDL, CHOLHDL, VLDL, LDLCALC in the last 168 hours.  CBG:  Recent  Labs Lab 09/11/14 1106  GLUCAP 181*    Microbiology: No results found for this or any previous visit.  Coagulation Studies:  Recent Labs  09/11/14 1138  LABPROT 14.7  INR 1.13    Imaging: Ct Head Wo Contrast  09/11/2014   CLINICAL DATA:  79 year old female with history diabetes and high blood pressure presenting with headache and left-sided weakness. Initial encounter.  EXAM: CT HEAD WITHOUT CONTRAST  TECHNIQUE: Contiguous axial images were obtained from the base of the skull through the vertex without intravenous contrast.  COMPARISON:  No comparison head CT.  FINDINGS: No intracranial hemorrhage.  Small vessel  disease type changes without CT evidence of large acute infarct.  No hydrocephalus.  No intracranial mass lesion noted on this unenhanced exam.  Vascular calcifications.  Mastoid air cells, middle ear cavities and visualized paranasal sinuses are clear.  Orbital structures unremarkable.  IMPRESSION: No intracranial hemorrhage or CT evidence of large acute infarct.  Small vessel disease type changes.  Please see above.   Electronically Signed   By: Chauncey Cruel M.D.   On: 09/11/2014 12:22       Assessment and plan discussed with with attending physician and they are in agreement.    Etta Quill PA-C [facility] [phone]  09/11/2014, 3:11 PM     Stroke Risk Factors - diabetes mellitus and hypertension   Assessment: 79 y.o. female with a patchy distribution of numbness suggestive of a sensory neuronopathy. She has no weakness, and the distribution of numbness is restricted to two nerve roots. She is getting an MRI of her brain, but the presentation is not consistent with stroke. Cervical pathology could be related to her left arm, but would not be expected to include her left leg. Sensory neuronopathies can be paraneoplastic associated with lung cancer and it would be worth checking for this. I am not familiar with described cases from thyroid cancer.     1) C-Spine MRI 2) SSA/SSB 3) A1C 4) CT chest/abd/pelvis w contrast 5) will continue to follow.   Roland Rack, MD [facility] [phone]  If 7pm- 7am, please page neurology on call as listed in [place].

## 2014-09-11 NOTE — ED Provider Notes (Signed)
CSN: [id number]     Arrival date & time 09/11/14  1033 History   First MD Initiated Contact with Patient 09/11/14 1058     Chief Complaint  Patient presents with  . Numbness  . Hypertension     (Consider location/radiation/quality/duration/timing/severity/associated sxs/prior Treatment) HPI Comments: Patient states approximately a 1 AM today she developed a left-sided headache and also noticed numbness and tingling in her left upper and lower extremity which has been persistent and not improving. She initially noticed some mild blurred vision when she attempted to read but states that is resolved now. She denies any heaviness of the arm and leg and she was able to ambulate. No prior symptoms of similar. She does have a history of hypertension and diabetes. Recently started on a new blood pressure medication a week ago for persistent hypertension. She has taken her medicines this morning. She denies any chest pain, shortness of breath, fever, cough  The history is provided by the patient.    Past Medical History  Diagnosis Date  . Hypertension   . Hypothyroidism   . Diabetes mellitus     type !  . Cancer     thyroid cancer   Past Surgical History  Procedure Laterality Date  . Thyroidectomy  2005  . Abdominal hysterectomy    . Tonsillectomy    . Lumbar spine surgery    . Flexible sigmoidoscopy  03/29/2012    Procedure: FLEXIBLE SIGMOIDOSCOPY;  Surgeon: Jeryl Columbia, MD;  Location: [facility] ENDOSCOPY;  Service: Endoscopy;  Laterality: N/A;  fleet enema upon arrival  . Hot hemostasis  03/29/2012    Procedure: HOT HEMOSTASIS (ARGON PLASMA COAGULATION/BICAP);  Surgeon: Jeryl Columbia, MD;  Location: [facility] ENDOSCOPY;  Service: Endoscopy;  Laterality: N/A;   Family History  Problem Relation Age of Onset  . Heart failure Mother   . Heart attack Father    History  Substance Use Topics  . Smoking status: Never Smoker   . Smokeless tobacco: Not on file  . Alcohol Use: No   OB History    No  data available     Review of Systems  All other systems reviewed and are negative.     Allergies  Nyquil multi-symptom and Darvon  Home Medications   Prior to Admission medications   Medication Sig Start Date End Date Taking? Authorizing Provider  Canagliflozin (INVOKANA) 100 MG TABS Take 100 mg by mouth daily.    Historical Provider, MD  diltiazem (TIAZAC) 360 MG 24 hr capsule Take 360 mg by mouth daily.    Historical Provider, MD  insulin aspart (NOVOLOG) 100 UNIT/ML injection Inject into the skin 2 (two) times daily with a meal.    Historical Provider, MD  levothyroxine (SYNTHROID, LEVOTHROID) 200 MCG tablet Take 200 mcg by mouth daily.    Historical Provider, MD  losartan-hydrochlorothiazide (HYZAAR) 100-25 MG per tablet Take 1 tablet by mouth daily.    Historical Provider, MD  metoprolol (LOPRESSOR) 100 MG tablet Take 100 mg by mouth 2 (two) times daily.    Historical Provider, MD  traMADol (ULTRAM) 50 MG tablet Take 1 tablet (50 mg total) by mouth every 6 (six) hours as needed. 06/30/13   Janne Napoleon, NP  traMADol (ULTRAM) 50 MG tablet Take 1 tablet (50 mg total) by mouth every 6 (six) hours as needed. For pain. 06/30/13   Janne Napoleon, NP   BP 186/66 mmHg  Pulse 61  Temp(Src) 98.6 F (37 C) (Oral)  Resp 13  Ht 5'  4" (1.626 m)  Wt 231 lb (104.781 kg)  BMI 39.63 kg/m2  SpO2 95% Physical Exam  Constitutional: She is oriented to person, place, and time. She appears well-developed and well-nourished. No distress.  HENT:  Head: Normocephalic and atraumatic.  Mouth/Throat: Oropharynx is clear and moist.  Eyes: Conjunctivae and EOM are normal. Pupils are equal, round, and reactive to light.  Neck: Normal range of motion. Neck supple.  Cardiovascular: Normal rate, regular rhythm and intact distal pulses.   No murmur heard. Pulmonary/Chest: Effort normal and breath sounds normal. No respiratory distress. She has no wheezes. She has no rales.  Abdominal: Soft. She exhibits no  distension. There is no tenderness. There is no rebound and no guarding.  Musculoskeletal: Normal range of motion. She exhibits no edema or tenderness.  Neurological: She is alert and oriented to person, place, and time. She has normal strength. A cranial nerve deficit and sensory deficit is present.  Decreased sensation on the left side of her face as well as mild facial droop.  Decreased sensation in the left upper and lower extremity  Skin: Skin is warm and dry. No rash noted. No erythema.  Psychiatric: She has a normal mood and affect. Her behavior is normal.  Nursing note and vitals reviewed.   ED Course  Procedures (including critical care time) Labs Review Labs Reviewed  COMPREHENSIVE METABOLIC PANEL - Abnormal; Notable for the following:    Glucose, Bld 210 (*)    GFR calc non Af Amer 66 (*)    GFR calc Af Amer 76 (*)    All other components within normal limits  CBG MONITORING, ED - Abnormal; Notable for the following:    Glucose-Capillary 181 (*)    All other components within normal limits  I-STAT CHEM 8, ED - Abnormal; Notable for the following:    Glucose, Bld 209 (*)    Calcium, Ion 1.09 (*)    All other components within normal limits  CBC WITH DIFFERENTIAL/PLATELET  URINALYSIS, ROUTINE W REFLEX MICROSCOPIC  ETHANOL  PROTIME-INR  APTT  URINE RAPID DRUG SCREEN (HOSP PERFORMED)  I-STAT TROPOININ, ED  I-STAT TROPOININ, ED    Imaging Review Ct Head Wo Contrast  09/11/2014   CLINICAL DATA:  79 year old female with history diabetes and high blood pressure presenting with headache and left-sided weakness. Initial encounter.  EXAM: CT HEAD WITHOUT CONTRAST  TECHNIQUE: Contiguous axial images were obtained from the base of the skull through the vertex without intravenous contrast.  COMPARISON:  No comparison head CT.  FINDINGS: No intracranial hemorrhage.  Small vessel disease type changes without CT evidence of large acute infarct.  No hydrocephalus.  No  intracranial mass lesion noted on this unenhanced exam.  Vascular calcifications.  Mastoid air cells, middle ear cavities and visualized paranasal sinuses are clear.  Orbital structures unremarkable.  IMPRESSION: No intracranial hemorrhage or CT evidence of large acute infarct.  Small vessel disease type changes.  Please see above.   Electronically Signed   By: Chauncey Cruel M.D.   On: 09/11/2014 12:22     EKG Interpretation   Date/Time:  Tuesday September 11 2014 10:47:31 EST Ventricular Rate:  54 PR Interval:  172 QRS Duration: 82 QT Interval:  458 QTC Calculation: 434 R Axis:   1 Text Interpretation:  Sinus bradycardia with sinus arrhythmia Nonspecific  T wave abnormality No significant change since last tracing Confirmed by  Maryan Rued  MD, Shivan Hodes ([id number]) on 09/11/2014 10:58:45 AM      MDM  Final diagnoses:  Cerebral infarction due to unspecified mechanism  Essential hypertension    Patient here with symptoms concerning for stroke. She developed a left-sided headache as well as left-sided facial, upper and lower extremity numbness since 1 AM. She denies any speech or swallowing difficulties. No chest pain or shortness of breath. Approximately 1 week ago she started on micardis addition to her other blood pressure medications.  She currently denies any visual symptoms and does not normally get headaches. She is hypertensive here 190/77 when I was evaluating. She has no focal weakness but minimal facial droop and decreased sensation on the left.  Code stroke order set pending  12:41 PM Initial imaging and lab work are within normal limits. However given symptoms and risk factors feel patient needs admission for stroke workup.  Blanchie Dessert, MD 09/11/14 [phone]

## 2014-09-11 NOTE — Progress Notes (Signed)
Pharmacy Home Medication Reconciliation Communication High Risk Medication: Humulin U-500 Insulin  Home dose of Humulin U-500 insulin was reordered for this patient. The dose was verified with the patient as listed below:  Type of syringe used at home:  U-100 Number or line on syringe to which patient draws up insulin: 20 units  ** This equates to 100 units of U-500 insulin **  Discussed in detail insulin regimen with patient this evening. She uses a U-100 syringe and pulls it up to the "20 unit" mark which is equal to a total amount of 100 units of U-500 insulin (I had the patient use the insulin syringe and show me how she drew up her dose). She gives herself this dose prior to breakfast and dinner.   The patient will need to bring in her home U-500 in order for it to be administered this admission. Once brought in, it will need to be taken to the pharmacy for safe handling and dispensing. Upon discharge, the remainder will be returned back to her. This was discussed with the patient who understands. A family member should be able to bring in the U-500 by 1/27 evening. In the meantime, the patient understands she will be covered by the hospital's SSI regimen.   If desired to be resumed this admission, the physician will need to enter for the U-500 dose to be resumed.  Alycia Rossetti, PharmD, BCPS Clinical Pharmacist Pager: [phone] 09/11/2014 8:24 PM

## 2014-09-11 NOTE — ED Notes (Signed)
Patient states she was seen by her MD 2 weeks ago.  Patient states she has been taking her medications as directed.  Patient states she had a bad headache last night and then at 0100 she had onset of numbness in her fingers in the left hand.  Patient states her arm started feeling strange 2 weeks ago.  Patient states she has numbness in the left foot as well.  Patient was able to walk into triage.  She states her vision is blurred.  Patient denies chest pain/denies sob.  Patient states she took asprin this morning.  Patient has taken her bp medication this morning.  Patient is alert. Speech is clear.  Patient is diabetic.  She states her sugar was 247 this morning. Patient is on insulin

## 2014-09-12 ENCOUNTER — Inpatient Hospital Stay (HOSPITAL_COMMUNITY): Payer: Medicare Other

## 2014-09-12 ENCOUNTER — Encounter (HOSPITAL_COMMUNITY): Payer: Self-pay

## 2014-09-12 DIAGNOSIS — I639 Cerebral infarction, unspecified: Secondary | ICD-10-CM

## 2014-09-12 DIAGNOSIS — E118 Type 2 diabetes mellitus with unspecified complications: Secondary | ICD-10-CM

## 2014-09-12 LAB — GLUCOSE, CAPILLARY
Glucose-Capillary: 210 mg/dL — ABNORMAL HIGH (ref 70–99)
Glucose-Capillary: 157 mg/dL — ABNORMAL HIGH (ref 70–99)
Glucose-Capillary: 167 mg/dL — ABNORMAL HIGH (ref 70–99)

## 2014-09-12 LAB — BASIC METABOLIC PANEL
Anion gap: 12 (ref 5–15)
BUN: 10 mg/dL (ref 6–23)
CO2: 24 mmol/L (ref 19–32)
Calcium: 8.8 mg/dL (ref 8.4–10.5)
Chloride: 103 mmol/L (ref 96–112)
Creatinine, Ser: 0.66 mg/dL (ref 0.50–1.10)
GFR calc Af Amer: >90 mL/min (ref >90)
GFR calc non Af Amer: 84 mL/min — ABNORMAL LOW (ref >90)
Glucose, Bld: 203 mg/dL — ABNORMAL HIGH (ref 70–99)
Potassium: 4 mmol/L (ref 3.5–5.1)
Sodium: 139 mmol/L (ref 135–145)

## 2014-09-12 LAB — HEMOGLOBIN A1C
Hgb A1c MFr Bld: 8.6 % — ABNORMAL HIGH (ref <5.7)
Mean Plasma Glucose: 200 mg/dL — ABNORMAL HIGH (ref <117)

## 2014-09-12 MED ORDER — METOPROLOL TARTRATE 1 MG/ML IV SOLN
2.5000 mg | Freq: Once | INTRAVENOUS | Status: AC
  Administered 2014-09-12: 2.5 mg via INTRAVENOUS
  Filled 2014-09-12: qty 5

## 2014-09-12 MED ORDER — METOPROLOL TARTRATE 1 MG/ML IV SOLN
5.0000 mg | Freq: Once | INTRAVENOUS | Status: AC
  Administered 2014-09-12: 5 mg via INTRAVENOUS
  Filled 2014-09-12: qty 5

## 2014-09-12 MED ORDER — ROSUVASTATIN CALCIUM 20 MG PO TABS
20.0000 mg | ORAL_TABLET | Freq: Every day | ORAL | Status: DC
  Administered 2014-09-12 – 2014-09-13 (×2): 20 mg via ORAL
  Filled 2014-09-12 (×3): qty 1

## 2014-09-12 MED ORDER — LEVALBUTEROL HCL 0.63 MG/3ML IN NEBU: 0.6300 mg | INHALATION_SOLUTION | Freq: Four times a day (QID) | RESPIRATORY_TRACT | Status: DC | PRN

## 2014-09-12 MED ORDER — IOHEXOL 300 MG/ML  SOLN
100.0000 mL | Freq: Once | INTRAMUSCULAR | Status: AC | PRN
  Administered 2014-09-12: 100 mL via INTRAVENOUS

## 2014-09-12 MED ORDER — MORPHINE SULFATE 2 MG/ML IJ SOLN
0.5000 mg | Freq: Once | INTRAMUSCULAR | Status: AC
  Administered 2014-09-12: 0.5 mg via INTRAVENOUS
  Filled 2014-09-12: qty 1

## 2014-09-12 MED ORDER — MORPHINE SULFATE 2 MG/ML IJ SOLN: 1.0000 mg | Freq: Once | INTRAMUSCULAR | Status: DC

## 2014-09-12 NOTE — Progress Notes (Signed)
UR complete.  Drewey Begue RN, MSN 

## 2014-09-12 NOTE — Progress Notes (Signed)
Bilateral carotid artery duplex completed:  1-39% ICA stenosis.  Vertebral artery flow is antegrade.     

## 2014-09-12 NOTE — Evaluation (Signed)
Occupational Therapy Evaluation Patient Details Name: Kaylee Mullins MRN: [id number] DOB: 06/13/1936 Today's Date: 09/12/2014    History of Present Illness This 79 y.o. female admitted with 2-3 h/o decreased sensation Lt UE and Lt LE as well as new onset HA.   Per neurology notes pt with patch distribution of numbness suggestive of sensory neuropathy as her presentation is not consistent with CVA.  Work up to rule out paraneoplastic syndrome underway.   MRI of brain + acute Rt thalamic infarct; MRI of cervical spine showed moderate to severe multilevel cervical disc degeneration most notable at C5-6; CT abdomen showed large mass upper pole of rt kidney concerning for renal cell carcinoma.  PMH includes:  HTN; DM; Thyroid CA; s/p lumbar spine surgery    Clinical Impression   Patient evaluated by Occupational Therapy with no further acute OT needs identified. All education has been completed and the patient has no further questions. Pt presents to OT with mild sensory loss Lt. UE and hand.  It does not impact her coordination during BADLs.  Did discuss sensory loss precautions and need to monitor Lt. UE visually when close to hot objects or cutting to prevent injury.   She verbalized understanding.   Otherwise, she is back to baseline.  See below for any follow-up Occupational Therapy or equipment needs. OT is signing off. Thank you for this referral.      Follow Up Recommendations  No OT follow up    Equipment Recommendations  None recommended by OT    Recommendations for Other Services       Precautions / Restrictions Precautions Precautions: None Restrictions Weight Bearing Restrictions: No      Mobility Bed Mobility Overal bed mobility: Independent                Transfers Overall transfer level: Independent                    Balance Overall balance assessment: No apparent balance deficits (not formally assessed)                                          ADL Overall ADL's : Independent                                       General ADL Comments: Pt is ambulatory in room independently.  She was able to simulate showering in standing with no LOB or difficulties.  Able to gather clothes independently.       Vision Eye Alignment: Within Functional Limits   Ocular Range of Motion: Within Functional Limits Tracking/Visual Pursuits: Able to track stimulus in all quads without difficulty Saccades: Within functional limits           Perception Perception Perception Tested?: Yes   Praxis Praxis Praxis tested?: Within functional limits    Pertinent Vitals/Pain Pain Assessment: No/denies pain     Hand Dominance Right   Extremity/Trunk Assessment Upper Extremity Assessment Upper Extremity Assessment: LUE deficits/detail LUE Deficits / Details: Pt with diminished sensation Lt hand and dorsal arm.  But, she is able to manipulate small objects with no drops.  ROM full.  Did discuss need to visually monitor Lt hand when cooking or cutting food as her decreased sensation could place her at higher risk for  injury.  She verbalized understanding  LUE Sensation: decreased light touch   Lower Extremity Assessment Lower Extremity Assessment: Defer to PT evaluation   Cervical / Trunk Assessment Cervical / Trunk Assessment: Normal   Communication Communication Communication: No difficulties   Cognition   Behavior During Therapy: WFL for tasks assessed/performed Overall Cognitive Status: Within Functional Limits for tasks assessed                     General Comments       Exercises       Shoulder Instructions      Home Living Family/patient expects to be discharged to:: Private residence Living Arrangements: Alone Available Help at Discharge: Family;Available PRN/intermittently Type of Home: House Home Access: Level entry     Home Layout: Two level Alternate Level Stairs-Number of Steps:  split foyer home with 10-12 steps to go up or down as soon as she enters.  Alternate Level Stairs-Rails: Right;Left Bathroom Shower/Tub: Occupational psychologist: Standard     Home Equipment: None          Prior Functioning/Environment Level of Independence: Independent        Comments: Pt reports she drives and is active in the community     OT Diagnosis: Other (comment) (impaired sensation Lt UE )   OT Problem List: Impaired sensation   OT Treatment/Interventions:      OT Goals(Current goals can be found in the care plan section)    OT Frequency:     Barriers to D/C:            Co-evaluation              End of Session    Activity Tolerance: Patient tolerated treatment well Patient left: in bed;with call bell/phone within reach   Time: 1033-1105 OT Time Calculation (min): 32 min Charges:  OT General Charges $OT Visit: 1 Procedure OT Evaluation $Initial OT Evaluation Tier I: 1 Procedure OT Treatments $Self Care/Home Management : 8-22 mins G-Codes:    Landra Howze M October 02, 2014, 11:21 AM

## 2014-09-12 NOTE — Evaluation (Signed)
Physical Therapy Evaluation Patient Details Name: Kaylee Mullins MRN: [id number] DOB: 31-Mar-1936 Today's Date: 09/12/2014   History of Present Illness  This 79 y.o. female admitted with 2-3 h/o decreased sensation Lt UE and Lt LE as well as new onset HA.   Per neurology notes pt with patch distribution of numbness suggestive of sensory neuropathy as her presentation is not consistent with CVA.  Work up to rule out paraneoplastic syndrome underway.   MRI of brain + acute Rt thalamic infarct; MRI of cervical spine showed moderate to severe multilevel cervical disc degeneration most notable at C5-6; CT abdomen showed large mass upper pole of rt kidney concerning for renal cell carcinoma.  PMH includes:  HTN; DM; Thyroid CA; s/p lumbar spine surgery   Clinical Impression  Patient functioning close to baseline and tolerated ambulating community distances and negotiating steps without difficulty. Pt very active PTA and decreased sensation in LUE/LE does not affect functional mobility or balance. Pt does not require further skilled therapy services. All education performed. Discharge from therapy.    Follow Up Recommendations No PT follow up    Equipment Recommendations  None recommended by PT    Recommendations for Other Services       Precautions / Restrictions Precautions Precautions: None Restrictions Weight Bearing Restrictions: No      Mobility  Bed Mobility               General bed mobility comments: Received sitting in couch by window upon PT arrival.   Transfers Overall transfer level: Needs assistance Equipment used: None Transfers: Sit to/from Stand Sit to Stand: Modified independent (Device/Increase time)            Ambulation/Gait Ambulation/Gait assistance: Modified independent (Device/Increase time) Ambulation Distance (Feet): 150 Feet Assistive device: None Gait Pattern/deviations: Step-through pattern;Decreased stride length;Wide base of support      General Gait Details: Pt with steady gait. Mild drifting noted at times with head turns but pt reports this as baseline and no overt LOB noted.   Stairs Stairs: Yes Stairs assistance: Modified independent (Device/Increase time) Stair Management: One rail Right;Alternating pattern;Step to pattern;Sideways;Forwards Number of Stairs: 12 General stair comments: Ascends steps wtih alternating pattern, descends steps with BUEs on rail and sideways. Reports she takes off heels before descending steps at home on church days.  Wheelchair Mobility    Modified Rankin (Stroke Patients Only) Modified Rankin (Stroke Patients Only) Pre-Morbid Rankin Score: No significant disability Modified Rankin: No significant disability     Balance Overall balance assessment: No apparent balance deficits (not formally assessed)                                           Pertinent Vitals/Pain Pain Assessment: No/denies pain    Home Living Family/patient expects to be discharged to:: Private residence Living Arrangements: Alone Available Help at Discharge: Family;Available PRN/intermittently Type of Home: House Home Access: Level entry     Home Layout: Two level Home Equipment: None      Prior Function Level of Independence: Independent         Comments: Pt reports she drives and is active in the community      Hand Dominance   Dominant Hand: Right    Extremity/Trunk Assessment   Upper Extremity Assessment: Defer to OT evaluation       LUE Deficits / Details: Pt with diminished sensation  Lt hand and dorsal arm.  But, she is able to manipulate small objects with no drops.  ROM full.  Did discuss need to visually monitor Lt hand when cooking or cutting food as her decreased sensation could place her at higher risk for injury.  She verbalized understanding    Lower Extremity Assessment: LLE deficits/detail   LLE Deficits / Details: Pt reports mild numbness LLE however  no evidence of weakness as observed during functional assessment.  Cervical / Trunk Assessment: Normal  Communication   Communication: No difficulties  Cognition Arousal/Alertness: Awake/alert Behavior During Therapy: WFL for tasks assessed/performed Overall Cognitive Status: Within Functional Limits for tasks assessed                      General Comments      Exercises        Assessment/Plan    PT Assessment Patent does not need any further PT services  PT Diagnosis     PT Problem List    PT Treatment Interventions     PT Goals (Current goals can be found in the Care Plan section) Acute Rehab PT Goals PT Goal Formulation: All assessment and education complete, DC therapy    Frequency     Barriers to discharge        Co-evaluation               End of Session   Activity Tolerance: Patient tolerated treatment well Patient left: in chair;with call bell/phone within reach           Time: 1520-1532 PT Time Calculation (min) (ACUTE ONLY): 12 min   Charges:   PT Evaluation $Initial PT Evaluation Tier I: 1 Procedure     PT G CodesCandy Sledge A 10-06-2014, 4:13 PM  Candy Sledge, [place], DPT [phone]

## 2014-09-12 NOTE — Consult Note (Signed)
Urology Consult   Physician requesting consult: Gherghe  Reason for consult: Right renal mass  History of Present Illness: Kaylee Mullins is a 79 y.o. female who was admitted yesterday for headache as well as neurologic changes. At first, she was felt not to have had a neurologic event, possibly a paraneoplastic syndrome. Because of that, she underwent scanning of her chest and abdomen. This revealed a slightly enlarged left mediastinal node as well as a 33 mm right upper pole exophytic enhancing renal lesion. This was consistent with renal cell carcinoma. There is no associated pericaval adenopathy. No pulmonary nodules were noted. Calcium level as well as comprehensive metabolic panel is normal.  The patient denies any history of lateralizing flank pain, gross hematuria, history of kidney stones or prior urologic illnesses. She does have a history of thyroid cancer, diabetes mellitus, hypertension. She has never seen a urologist before.    Past Medical History  Diagnosis Date  . Hypertension   . Hypothyroidism   . Diabetes mellitus     type !  . Cancer     thyroid cancer    Past Surgical History  Procedure Laterality Date  . Thyroidectomy  2005  . Abdominal hysterectomy    . Tonsillectomy    . Lumbar spine surgery    . Flexible sigmoidoscopy  03/29/2012    Procedure: FLEXIBLE SIGMOIDOSCOPY;  Surgeon: Jeryl Columbia, MD;  Location: [facility] ENDOSCOPY;  Service: Endoscopy;  Laterality: N/A;  fleet enema upon arrival  . Hot hemostasis  03/29/2012    Procedure: HOT HEMOSTASIS (ARGON PLASMA COAGULATION/BICAP);  Surgeon: Jeryl Columbia, MD;  Location: [facility] ENDOSCOPY;  Service: Endoscopy;  Laterality: N/A;     Current Hospital Medications: Scheduled Meds: .  stroke: mapping our early stages of recovery book   Does not apply Once  . aspirin  300 mg Rectal Daily   Or  . aspirin  325 mg Oral Daily  . canagliflozin  100 mg Oral Daily  . diltiazem  360 mg Oral Daily  . heparin  5,000 Units  Subcutaneous 3 times per day  . insulin aspart  0-15 Units Subcutaneous TID WC  . insulin aspart  0-5 Units Subcutaneous QHS  . insulin aspart  4 Units Subcutaneous TID WC  . levothyroxine  200 mcg Oral Daily  . metoprolol  100 mg Oral BID  . rosuvastatin  20 mg Oral q1800   Continuous Infusions:  PRN Meds:.acetaminophen, levalbuterol, senna-docusate  Allergies:  Allergies  Allergen Reactions  . Nyquil Multi-Symptom [Pseudoeph-Doxylamine-Dm-Apap] Other (See Comments)    Makes pt not "feel right in her head"  . Darvon [Propoxyphene Hcl]     hallucinations    Family History  Problem Relation Age of Onset  . Heart failure Mother   . Heart attack Father     Social History:  reports that she has never smoked. She does not have any smokeless tobacco history on file. She reports that she does not drink alcohol or use illicit drugs.  ROS: A complete review of systems was performed.  All systems are negative except for pertinent findings as noted.  Physical Exam:  Vital signs in last 24 hours: Temp:  [98 F (36.7 C)-98.4 F (36.9 C)] 98.4 F (36.9 C) (01/27 1809) Pulse Rate:  [61-72] 65 (01/27 1809) Resp:  [20-22] 20 (01/27 1809) BP: (146-232)/(63-107) 172/63 mmHg (01/27 1809) SpO2:  [96 %-100 %] 98 % (01/27 1809) General:  Alert and oriented, No acute distress HEENT: Normocephalic, atraumatic Neck: No JVD  or lymphadenopathy Cardiovascular: Regular rate and rhythm Abdomenobese, soft , nontender, nondistended, no abdominal masses Extremities: No edema Neurologic: Grossly intact  Laboratory Data:   Recent Labs  09/11/14 1138 09/11/14 1145  WBC 5.3  --   HGB 12.7 14.3  HCT 38.4 42.0  PLT 212  --      Recent Labs  09/11/14 1138 09/11/14 1145 09/12/14 0502  NA 139 142 139  K 3.7 3.9 4.0  CL 107 105 103  GLUCOSE 210* 209* 203*  BUN 17 22 10   CALCIUM 8.9  --  8.8  CREATININE 0.84 0.70 0.66     Results for orders placed or performed during the hospital  encounter of 09/11/14 (from the past 24 hour(s))  Glucose, capillary     Status: Abnormal   Collection Time: 09/11/14  9:05 PM  Result Value Ref Range   Glucose-Capillary 300 (H) 70 - 99 mg/dL   Comment 1 Documented in Chart    Comment 2 Notify RN   Glucose, capillary     Status: Abnormal   Collection Time: 09/11/14 10:37 PM  Result Value Ref Range   Glucose-Capillary 224 (H) 70 - 99 mg/dL  Hemoglobin A1c     Status: Abnormal   Collection Time: 09/12/14  5:02 AM  Result Value Ref Range   Hgb A1c MFr Bld 8.6 (H) <5.7 %   Mean Plasma Glucose 200 (H) <117 mg/dL  Basic metabolic panel     Status: Abnormal   Collection Time: 09/12/14  5:02 AM  Result Value Ref Range   Sodium 139 135 - 145 mmol/L   Potassium 4.0 3.5 - 5.1 mmol/L   Chloride 103 96 - 112 mmol/L   CO2 24 19 - 32 mmol/L   Glucose, Bld 203 (H) 70 - 99 mg/dL   BUN 10 6 - 23 mg/dL   Creatinine, Ser 0.66 0.50 - 1.10 mg/dL   Calcium 8.8 8.4 - 10.5 mg/dL   GFR calc non Af Amer 84 (L) >90 mL/min   GFR calc Af Amer >90 >90 mL/min   Anion gap 12 5 - 15  Glucose, capillary     Status: Abnormal   Collection Time: 09/12/14 11:23 AM  Result Value Ref Range   Glucose-Capillary 210 (H) 70 - 99 mg/dL   Comment 1 Notify RN   Glucose, capillary     Status: Abnormal   Collection Time: 09/12/14  5:10 PM  Result Value Ref Range   Glucose-Capillary 157 (H) 70 - 99 mg/dL   Comment 1 Notify RN    Comment 2 Documented in Chart    No results found for this or any previous visit (from the past 240 hour(s)).  Renal Function:  Recent Labs  09/11/14 1138 09/11/14 1145 09/12/14 0502  CREATININE 0.84 0.70 0.66   Estimated Creatinine Clearance: 70.6 mL/min (by C-G formula based on Cr of 0.66).  Radiologic Imaging: Ct Head Wo Contrast  09/11/2014   CLINICAL DATA:  79 year old female with history diabetes and high blood pressure presenting with headache and left-sided weakness. Initial encounter.  EXAM: CT HEAD WITHOUT CONTRAST   TECHNIQUE: Contiguous axial images were obtained from the base of the skull through the vertex without intravenous contrast.  COMPARISON:  No comparison head CT.  FINDINGS: No intracranial hemorrhage.  Small vessel disease type changes without CT evidence of large acute infarct.  No hydrocephalus.  No intracranial mass lesion noted on this unenhanced exam.  Vascular calcifications.  Mastoid air cells, middle ear cavities and  visualized paranasal sinuses are clear.  Orbital structures unremarkable.  IMPRESSION: No intracranial hemorrhage or CT evidence of large acute infarct.  Small vessel disease type changes.  Please see above.   Electronically Signed   By: Chauncey Cruel M.D.   On: 09/11/2014 12:22   Ct Chest W Contrast  09/12/2014   CLINICAL DATA:  Initial evaluation for possbile paraneoplastic syndrome related to left arm and left leg decreased sensation, sensory neuropathy  EXAM: CT CHEST, ABDOMEN, AND PELVIS WITH CONTRAST  TECHNIQUE: Multidetector CT imaging of the chest, abdomen and pelvis was performed following the standard protocol during bolus administration of intravenous contrast.  CONTRAST:  159mL OMNIPAQUE IOHEXOL 300 MG/ML  SOLN  COMPARISON:  None.  FINDINGS: CT CHEST FINDINGS  There are borderline mediastinal lymph nodes come with a 10 mm aortopulmonary window lymph node as the largest. There is calcification of the aortic arch. There is coronary arterial calcification and moderate cardiac enlargement. There is a very small hiatal hernia. There is no significant pleural or pericardial effusion.  Thyroid is not identified. There are surgical clips in the region of the right thyroid. No significant pulmonary parenchymal opacification on the right identified. On the left, there is subsegmental atelectasis along the diaphragmatic surface of the lower lobe. The left lung is otherwise clear.  CT ABDOMEN AND PELVIS FINDINGS  There is a 33 x 35 x 28 mm solid enhancing mass arising from the upper pole the  right kidney, within average attenuation value of 78. Right kidney is otherwise normal. Right renal vein is normal. Left kidney is normal.  There is hepatic steatosis of moderate severity. The spleen is normal. The adrenal glands are normal. The pancreas is atrophied with mild prominence of the pancreatic duct. There is a 19 mm cystic lesion in the tail of the pancreas and a 22 mm cystic lesion in the neck of the pancreas.  There is calcification of the abdominal aorta without dilatation. There is no significant retroperitoneal adenopathy. There is no significant mesenteric adenopathy.  Stomach and small bowel are normal. Appendix is normal. There is moderate diverticulosis of the sigmoid colon without evidence of diverticulitis.  Bladder is normal. Uterus is not identified. Ovaries appear normal except for a few small nonspecific calcifications involving both ovaries. There is a small fat containing right inguinal hernia.  There is no free fluid or inflammatory change in the abdomen or pelvis. There are no acute musculoskeletal findings. Patient is status post prior fusion at L4-5 level.  IMPRESSION: 1. No acute abnormalities in the thorax. Borderline mediastinal lymph nodes. 2. Mass upper pole right kidney highly concerning for renal cell carcinoma. 3. Pancreatic cystic lesions with pancreatic ductal prominence. These may represent benign cysts or pseudocysts. However, given the size of the larger, further characterization with MRI is suggested if the patient would be a candidate for optimal MRI imaging. If the patient is not, then follow up CT in six to twelve months is suggested. This recommendation follows ACR consensus guidelines: Managing Incidental Findings on Abdominal CT: White Paper of the ACR Incidental Findings Committee. J Am Coll Radiol 2010;7:754-773 4. Sigmoid colon diverticulosis These results will be called to the ordering clinician or representative by the Radiologist Assistant, and communication  documented in the PACS or zVision Dashboard.   Electronically Signed   By: Skipper Cliche M.D.   On: 09/12/2014 08:28   Mr Brain Wo Contrast  09/11/2014   CLINICAL DATA:  Severe left-sided headache with left-sided numbness and tingling. Transient  blurred vision. History of hypertension and diabetes.  EXAM: MRI HEAD WITHOUT CONTRAST  MRA HEAD WITHOUT CONTRAST  TECHNIQUE: Multiplanar, multiecho pulse sequences of the brain and surrounding structures were obtained without intravenous contrast. Angiographic images of the head were obtained using MRA technique without contrast.  COMPARISON:  Head CT 09/11/2014  FINDINGS: MRI HEAD FINDINGS  There is an 8 mm acute infarct in the right thalamus. There is no evidence of intracranial hemorrhage, mass, midline shift, or extra-axial fluid collection. Ventricles and sulci are normal. Patchy T2 hyperintensities in the cerebral white matter are nonspecific but compatible with mild-to-moderate chronic small vessel ischemic disease. Small, chronic infarcts are noted in the left pons, right thalamus, and left basal ganglia.  Orbits are unremarkable. Paranasal sinuses and mastoid air cells are clear. Major intracranial vascular flow voids are preserved.  MRA HEAD FINDINGS  Visualized distal vertebral arteries are patent with the right being dominant. There is irregularity and mild narrowing of the distal left vertebral artery. PICA and SCA origins are patent. Basilar artery is patent with minimal narrowing in its midportion. PCAs are unremarkable. There is a patent left posterior communicating artery.  Internal carotid arteries are patent from skullbase to carotid termini without stenosis. There is a small bulge measuring approximately 3 mm extending inferomedially from the right internal carotid artery near the ophthalmic artery origin which may reflect a tiny aneurysm, although there is mild motion artifact through this area. M1 and A1 segments are patent without stenosis. There  is mild bilateral A2 and MCA branch vessel irregularity suggestive of atherosclerosis.  IMPRESSION: 1. Acute right thalamic infarct. 2. Mild to moderate chronic small vessel ischemic disease. 3. Chronic lacunar infarcts in the deep gray nuclei and pons. 4. No major intracranial arterial occlusion. Intracranial atherosclerosis with mild narrowing of the distal left vertebral artery and minimal mid basilar narrowing. 5. 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   Electronically Signed   By: Logan Bores   On: 09/11/2014 19:11   Mr Cervical Spine Wo Contrast  09/11/2014   CLINICAL DATA:  Severe left-sided headache with left-sided numbness and tingling.  EXAM: MRI CERVICAL SPINE WITHOUT CONTRAST  TECHNIQUE: Multiplanar, multisequence MR imaging of the cervical spine was performed. No intravenous contrast was administered.  COMPARISON:  Neck CT 07/29/2005  FINDINGS: Images are mildly degraded by motion artifact. There is straightening of the normal cervical lordosis with trace retrolisthesis of C4 on C5 and C5 on C6. There is moderate to severe disc space narrowing at C4-5 and C5-6 with moderate narrowing at C6-7. Associated degenerative endplate changes are present. A hemangioma is noted in the C6 vertebral body. Craniocervical junction is unremarkable. Cervical spinal cord is normal in caliber and signal. Paraspinal soft tissues are unremarkable.  C2-3: Shallow central disc protrusion and right greater than left facet arthrosis without stenosis.  C3-4: Broad-based posterior disc osteophyte complex and right facet arthrosis result in mild bilateral neural foraminal stenosis without spinal stenosis.  C4-5: Broad-based posterior disc osteophyte complex results in borderline spinal stenosis and moderate to severe bilateral neural foraminal stenosis.  C5-6: Broad-based posterior disc osteophyte complex results in mild spinal stenosis and severe bilateral neural foraminal stenosis.  C6-7: Broad-based posterior disc  osteophyte complex results in borderline spinal stenosis and moderate to severe bilateral neural foraminal stenosis.  C7-T1:  Negative.  IMPRESSION: Moderate to severe multilevel cervical disc degeneration, most notable at C5-6 where there is mild spinal stenosis and severe bilateral neural foraminal stenosis.   Electronically Signed  By: Logan Bores   On: 09/11/2014 19:33   Ct Abdomen Pelvis W Contrast  09/12/2014   CLINICAL DATA:  Initial evaluation for possbile paraneoplastic syndrome related to left arm and left leg decreased sensation, sensory neuropathy  EXAM: CT CHEST, ABDOMEN, AND PELVIS WITH CONTRAST  TECHNIQUE: Multidetector CT imaging of the chest, abdomen and pelvis was performed following the standard protocol during bolus administration of intravenous contrast.  CONTRAST:  174mL OMNIPAQUE IOHEXOL 300 MG/ML  SOLN  COMPARISON:  None.  FINDINGS: CT CHEST FINDINGS  There are borderline mediastinal lymph nodes come with a 10 mm aortopulmonary window lymph node as the largest. There is calcification of the aortic arch. There is coronary arterial calcification and moderate cardiac enlargement. There is a very small hiatal hernia. There is no significant pleural or pericardial effusion.  Thyroid is not identified. There are surgical clips in the region of the right thyroid. No significant pulmonary parenchymal opacification on the right identified. On the left, there is subsegmental atelectasis along the diaphragmatic surface of the lower lobe. The left lung is otherwise clear.  CT ABDOMEN AND PELVIS FINDINGS  There is a 33 x 35 x 28 mm solid enhancing mass arising from the upper pole the right kidney, within average attenuation value of 78. Right kidney is otherwise normal. Right renal vein is normal. Left kidney is normal.  There is hepatic steatosis of moderate severity. The spleen is normal. The adrenal glands are normal. The pancreas is atrophied with mild prominence of the pancreatic duct. There is  a 19 mm cystic lesion in the tail of the pancreas and a 22 mm cystic lesion in the neck of the pancreas.  There is calcification of the abdominal aorta without dilatation. There is no significant retroperitoneal adenopathy. There is no significant mesenteric adenopathy.  Stomach and small bowel are normal. Appendix is normal. There is moderate diverticulosis of the sigmoid colon without evidence of diverticulitis.  Bladder is normal. Uterus is not identified. Ovaries appear normal except for a few small nonspecific calcifications involving both ovaries. There is a small fat containing right inguinal hernia.  There is no free fluid or inflammatory change in the abdomen or pelvis. There are no acute musculoskeletal findings. Patient is status post prior fusion at L4-5 level.  IMPRESSION: 1. No acute abnormalities in the thorax. Borderline mediastinal lymph nodes. 2. Mass upper pole right kidney highly concerning for renal cell carcinoma. 3. Pancreatic cystic lesions with pancreatic ductal prominence. These may represent benign cysts or pseudocysts. However, given the size of the larger, further characterization with MRI is suggested if the patient would be a candidate for optimal MRI imaging. If the patient is not, then follow up CT in six to twelve months is suggested. This recommendation follows ACR consensus guidelines: Managing Incidental Findings on Abdominal CT: White Paper of the ACR Incidental Findings Committee. J Am Coll Radiol 2010;7:754-773 4. Sigmoid colon diverticulosis These results will be called to the ordering clinician or representative by the Radiologist Assistant, and communication documented in the PACS or zVision Dashboard.   Electronically Signed   By: Skipper Cliche M.D.   On: 09/12/2014 08:28   Mr Jodene Nam Head/brain Wo Cm  09/11/2014   CLINICAL DATA:  Severe left-sided headache with left-sided numbness and tingling. Transient blurred vision. History of hypertension and diabetes.  EXAM: MRI  HEAD WITHOUT CONTRAST  MRA HEAD WITHOUT CONTRAST  TECHNIQUE: Multiplanar, multiecho pulse sequences of the brain and surrounding structures were obtained without intravenous contrast. Angiographic  images of the head were obtained using MRA technique without contrast.  COMPARISON:  Head CT 09/11/2014  FINDINGS: MRI HEAD FINDINGS  There is an 8 mm acute infarct in the right thalamus. There is no evidence of intracranial hemorrhage, mass, midline shift, or extra-axial fluid collection. Ventricles and sulci are normal. Patchy T2 hyperintensities in the cerebral white matter are nonspecific but compatible with mild-to-moderate chronic small vessel ischemic disease. Small, chronic infarcts are noted in the left pons, right thalamus, and left basal ganglia.  Orbits are unremarkable. Paranasal sinuses and mastoid air cells are clear. Major intracranial vascular flow voids are preserved.  MRA HEAD FINDINGS  Visualized distal vertebral arteries are patent with the right being dominant. There is irregularity and mild narrowing of the distal left vertebral artery. PICA and SCA origins are patent. Basilar artery is patent with minimal narrowing in its midportion. PCAs are unremarkable. There is a patent left posterior communicating artery.  Internal carotid arteries are patent from skullbase to carotid termini without stenosis. There is a small bulge measuring approximately 3 mm extending inferomedially from the right internal carotid artery near the ophthalmic artery origin which may reflect a tiny aneurysm, although there is mild motion artifact through this area. M1 and A1 segments are patent without stenosis. There is mild bilateral A2 and MCA branch vessel irregularity suggestive of atherosclerosis.  IMPRESSION: 1. Acute right thalamic infarct. 2. Mild to moderate chronic small vessel ischemic disease. 3. Chronic lacunar infarcts in the deep gray nuclei and pons. 4. No major intracranial arterial occlusion. Intracranial  atherosclerosis with mild narrowing of the distal left vertebral artery and minimal mid basilar narrowing. 5. 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   Electronically Signed   By: Logan Bores   On: 09/11/2014 19:11    I independently reviewed the above imaging studies. additionally, I viewed images independently with the patient.   Impression/Assessment:33 mm enhancing exophytic right upper pole lesion consistent with renal cell carcinoma. Less likely to be oncocytoma. This was not readily seen on an abdominal/ renal ultrasound performed in 2006 within the PACS system. It is unlikely this is causing any paraneoplastic syndrome. Most common syndromes consistent with advanced renal cell carcinoma include elevated liver function enzymes, hypercalcemia, erythrocytosis, thrombocytosis.   Plan:  I had an extensive discussion with the patient, and reviewed her CT findings with her. Currently, she does not need urgent management of this. Eventual treatment strategies would include CT directed biopsy with possible active surveillance if a low-grade cancer or possible benign disease, minimally invasive partial nephrectomy or percutaneous biopsy with cryoablation. I think it worthwhile to allow the patient to recover from her current situation, and I will see her back in the office after her discharge, and have a consultation with both the patient and her family. We will discuss further management. At this point, she favors surgical management, but I would suggest we give her time and more education prior to her choosing.  35 minutes was spent with the patient face-to-face.

## 2014-09-12 NOTE — Progress Notes (Signed)
Inpatient Diabetes Program Recommendations  AACE/ADA: New Consensus Statement on Inpatient Glycemic Control (2013)  Target Ranges:  Prepandial:   less than 140 mg/dL      Peak postprandial:   less than 180 mg/dL (1-2 hours)      Critically ill patients:  140 - 180 mg/dL   Reason for Assessment: Results for Kaylee Mullins, Kaylee Mullins (MRN [id number]) as of 09/12/2014 13:33  Ref. Range 09/11/2014 11:06 09/11/2014 19:43 09/11/2014 21:05 09/11/2014 22:37 09/12/2014 11:23  Glucose-Capillary Latest Range: 70-99 mg/dL 181 (H) 177 (H) 300 (H) 224 (H) 210 (H)  Results for Kaylee Mullins, Kaylee Mullins (MRN [id number]) as of 09/12/2014 13:33  Ref. Range 09/12/2014 05:02  Hgb A1c MFr Bld Latest Range: <5.7 % 8.6 (H)   Diabetes history:  Diabetes [facility] medications: U500 insulin 20 units bid (5 times more concentrated than U100 therefore equals 100 units bid), Invokana 100 mg daily Current orders for Inpatient glycemic control:  Novolog moderate tid with meals and HS, Invokana 100 mg daily  May consider adding a portion of basal insulin to current regimen.  Consider adding Levemir 30 units daily.  Adah Perl, RN, BC-ADM Inpatient Diabetes Coordinator Pager [phone]

## 2014-09-12 NOTE — Progress Notes (Signed)
STROKE TEAM PROGRESS NOTE   HISTORY Kaylee Mullins is an 79 y.o. female presenting to the ED due to 2-3 days of left arm decreased sensation. She states she first noted that the lateral aspect of her left arm and medial aspect of her left forearm along with her left hand felt "tingly". Over the course of one day she then noted the lateral aspect of her left leg and left calf felt decreased in sensation. Today she felt her BP was elevated and she developed a HA over the left temple with flashing lights in her visual field bilaterally. She took her BP which showed a systolic BP was 673 and 419. She felt her temple and noted her "blood vessels were pulsating". Currently her HA has reduced significantly and she continues to have decreased sensation in her left arm and leg as described above. She was last known well 09/09/2014, unable to determine time. Patient was not administered TPA secondary to out of the window. She was admitted to for further evaluation and treatment.   SUBJECTIVE (INTERVAL HISTORY) No family is at the bedside.  Overall she feels her condition is stable. Patient's daughter died 3 years ago from a glioblastoma. She states she has a mass on her kidney.   OBJECTIVE Temp:  [97.9 F (36.6 C)-98.4 F (36.9 C)] 98 F (36.7 C) (01/27 1014) Pulse Rate:  [51-72] 66 (01/27 1014) Cardiac Rhythm:  [-] Normal sinus rhythm (01/26 2000) Resp:  [13-22] 20 (01/27 1014) BP: (158-232)/(59-107) 181/68 mmHg (01/27 1014) SpO2:  [96 %-100 %] 98 % (01/27 1014)   Recent Labs Lab 09/11/14 1106 09/11/14 1943 09/11/14 2105 09/11/14 2237  GLUCAP 181* 177* 300* 224*    Recent Labs Lab 09/11/14 1138 09/11/14 1145 09/12/14 0502  NA 139 142 139  K 3.7 3.9 4.0  CL 107 105 103  CO2 25  --  24  GLUCOSE 210* 209* 203*  BUN 17 22 10   CREATININE 0.84 0.70 0.66  CALCIUM 8.9  --  8.8    Recent Labs Lab 09/11/14 1138  AST 25  ALT 20  ALKPHOS 56  BILITOT 0.7  PROT 7.5  ALBUMIN 3.5     Recent Labs Lab 09/11/14 1138 09/11/14 1145  WBC 5.3  --   NEUTROABS 2.8  --   HGB 12.7 14.3  HCT 38.4 42.0  MCV 93.2  --   PLT 212  --    No results for input(s): CKTOTAL, CKMB, CKMBINDEX, TROPONINI in the last 168 hours.  Recent Labs  09/11/14 1138  LABPROT 14.7  INR 1.13    Recent Labs  09/11/14 1158  COLORURINE YELLOW  LABSPEC 1.025  PHURINE 5.0  GLUCOSEU NEGATIVE  HGBUR NEGATIVE  BILIRUBINUR NEGATIVE  KETONESUR NEGATIVE  PROTEINUR NEGATIVE  UROBILINOGEN 0.2  NITRITE NEGATIVE  LEUKOCYTESUR NEGATIVE    No results found for: CHOL, TRIG, HDL, CHOLHDL, VLDL, LDLCALC No results found for: HGBA1C    Component Value Date/Time   LABOPIA NONE DETECTED 09/11/2014 1158   COCAINSCRNUR NONE DETECTED 09/11/2014 1158   LABBENZ NONE DETECTED 09/11/2014 1158   AMPHETMU NONE DETECTED 09/11/2014 1158   THCU NONE DETECTED 09/11/2014 1158   LABBARB NONE DETECTED 09/11/2014 1158     Recent Labs Lab 09/11/14 1138  ETH <5    Ct Head Wo Contrast  09/11/2014   CLINICAL DATA:  79 year old female with history diabetes and high blood pressure presenting with headache and left-sided weakness. Initial encounter.  EXAM: CT HEAD WITHOUT CONTRAST  TECHNIQUE: Contiguous axial images were obtained from the base of the skull through the vertex without intravenous contrast.  COMPARISON:  No comparison head CT.  FINDINGS: No intracranial hemorrhage.  Small vessel disease type changes without CT evidence of large acute infarct.  No hydrocephalus.  No intracranial mass lesion noted on this unenhanced exam.  Vascular calcifications.  Mastoid air cells, middle ear cavities and visualized paranasal sinuses are clear.  Orbital structures unremarkable.  IMPRESSION: No intracranial hemorrhage or CT evidence of large acute infarct.  Small vessel disease type changes.  Please see above.   Electronically Signed   By: Chauncey Cruel M.D.   On: 09/11/2014 12:22   Ct Chest W Contrast  09/12/2014    CLINICAL DATA:  Initial evaluation for possbile paraneoplastic syndrome related to left arm and left leg decreased sensation, sensory neuropathy  EXAM: CT CHEST, ABDOMEN, AND PELVIS WITH CONTRAST  TECHNIQUE: Multidetector CT imaging of the chest, abdomen and pelvis was performed following the standard protocol during bolus administration of intravenous contrast.  CONTRAST:  152mL OMNIPAQUE IOHEXOL 300 MG/ML  SOLN  COMPARISON:  None.  FINDINGS: CT CHEST FINDINGS  There are borderline mediastinal lymph nodes come with a 10 mm aortopulmonary window lymph node as the largest. There is calcification of the aortic arch. There is coronary arterial calcification and moderate cardiac enlargement. There is a very small hiatal hernia. There is no significant pleural or pericardial effusion.  Thyroid is not identified. There are surgical clips in the region of the right thyroid. No significant pulmonary parenchymal opacification on the right identified. On the left, there is subsegmental atelectasis along the diaphragmatic surface of the lower lobe. The left lung is otherwise clear.  CT ABDOMEN AND PELVIS FINDINGS  There is a 33 x 35 x 28 mm solid enhancing mass arising from the upper pole the right kidney, within average attenuation value of 78. Right kidney is otherwise normal. Right renal vein is normal. Left kidney is normal.  There is hepatic steatosis of moderate severity. The spleen is normal. The adrenal glands are normal. The pancreas is atrophied with mild prominence of the pancreatic duct. There is a 19 mm cystic lesion in the tail of the pancreas and a 22 mm cystic lesion in the neck of the pancreas.  There is calcification of the abdominal aorta without dilatation. There is no significant retroperitoneal adenopathy. There is no significant mesenteric adenopathy.  Stomach and small bowel are normal. Appendix is normal. There is moderate diverticulosis of the sigmoid colon without evidence of diverticulitis.   Bladder is normal. Uterus is not identified. Ovaries appear normal except for a few small nonspecific calcifications involving both ovaries. There is a small fat containing right inguinal hernia.  There is no free fluid or inflammatory change in the abdomen or pelvis. There are no acute musculoskeletal findings. Patient is status post prior fusion at L4-5 level.  IMPRESSION: 1. No acute abnormalities in the thorax. Borderline mediastinal lymph nodes. 2. Mass upper pole right kidney highly concerning for renal cell carcinoma. 3. Pancreatic cystic lesions with pancreatic ductal prominence. These may represent benign cysts or pseudocysts. However, given the size of the larger, further characterization with MRI is suggested if the patient would be a candidate for optimal MRI imaging. If the patient is not, then follow up CT in six to twelve months is suggested. This recommendation follows ACR consensus guidelines: Managing Incidental Findings on Abdominal CT: White Paper of the ACR Incidental Findings Committee. J Am Coll Radiol  2694;[id number]:[id number] 4. Sigmoid colon diverticulosis These results will be called to the ordering clinician or representative by the Radiologist Assistant, and communication documented in the PACS or zVision Dashboard.   Electronically Signed   By: Skipper Cliche M.D.   On: 09/12/2014 08:28   Mr Brain Wo Contrast  09/11/2014   CLINICAL DATA:  Severe left-sided headache with left-sided numbness and tingling. Transient blurred vision. History of hypertension and diabetes.  EXAM: MRI HEAD WITHOUT CONTRAST  MRA HEAD WITHOUT CONTRAST  TECHNIQUE: Multiplanar, multiecho pulse sequences of the brain and surrounding structures were obtained without intravenous contrast. Angiographic images of the head were obtained using MRA technique without contrast.  COMPARISON:  Head CT 09/11/2014  FINDINGS: MRI HEAD FINDINGS  There is an 8 mm acute infarct in the right thalamus. There is no evidence of intracranial  hemorrhage, mass, midline shift, or extra-axial fluid collection. Ventricles and sulci are normal. Patchy T2 hyperintensities in the cerebral white matter are nonspecific but compatible with mild-to-moderate chronic small vessel ischemic disease. Small, chronic infarcts are noted in the left pons, right thalamus, and left basal ganglia.  Orbits are unremarkable. Paranasal sinuses and mastoid air cells are clear. Major intracranial vascular flow voids are preserved.  MRA HEAD FINDINGS  Visualized distal vertebral arteries are patent with the right being dominant. There is irregularity and mild narrowing of the distal left vertebral artery. PICA and SCA origins are patent. Basilar artery is patent with minimal narrowing in its midportion. PCAs are unremarkable. There is a patent left posterior communicating artery.  Internal carotid arteries are patent from skullbase to carotid termini without stenosis. There is a small bulge measuring approximately 3 mm extending inferomedially from the right internal carotid artery near the ophthalmic artery origin which may reflect a tiny aneurysm, although there is mild motion artifact through this area. M1 and A1 segments are patent without stenosis. There is mild bilateral A2 and MCA branch vessel irregularity suggestive of atherosclerosis.  IMPRESSION: 1. Acute right thalamic infarct. 2. Mild to moderate chronic small vessel ischemic disease. 3. Chronic lacunar infarcts in the deep gray nuclei and pons. 4. No major intracranial arterial occlusion. Intracranial atherosclerosis with mild narrowing of the distal left vertebral artery and minimal mid basilar narrowing. 5. 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   Electronically Signed   By: Logan Bores   On: 09/11/2014 19:11   Mr Cervical Spine Wo Contrast  09/11/2014   CLINICAL DATA:  Severe left-sided headache with left-sided numbness and tingling.  EXAM: MRI CERVICAL SPINE WITHOUT CONTRAST  TECHNIQUE: Multiplanar,  multisequence MR imaging of the cervical spine was performed. No intravenous contrast was administered.  COMPARISON:  Neck CT 07/29/2005  FINDINGS: Images are mildly degraded by motion artifact. There is straightening of the normal cervical lordosis with trace retrolisthesis of C4 on C5 and C5 on C6. There is moderate to severe disc space narrowing at C4-5 and C5-6 with moderate narrowing at C6-7. Associated degenerative endplate changes are present. A hemangioma is noted in the C6 vertebral body. Craniocervical junction is unremarkable. Cervical spinal cord is normal in caliber and signal. Paraspinal soft tissues are unremarkable.  C2-3: Shallow central disc protrusion and right greater than left facet arthrosis without stenosis.  C3-4: Broad-based posterior disc osteophyte complex and right facet arthrosis result in mild bilateral neural foraminal stenosis without spinal stenosis.  C4-5: Broad-based posterior disc osteophyte complex results in borderline spinal stenosis and moderate to severe bilateral neural foraminal stenosis.  C5-6: Broad-based posterior disc osteophyte  complex results in mild spinal stenosis and severe bilateral neural foraminal stenosis.  C6-7: Broad-based posterior disc osteophyte complex results in borderline spinal stenosis and moderate to severe bilateral neural foraminal stenosis.  C7-T1:  Negative.  IMPRESSION: Moderate to severe multilevel cervical disc degeneration, most notable at C5-6 where there is mild spinal stenosis and severe bilateral neural foraminal stenosis.   Electronically Signed   By: Logan Bores   On: 09/11/2014 19:33   Ct Abdomen Pelvis W Contrast  09/12/2014   CLINICAL DATA:  Initial evaluation for possbile paraneoplastic syndrome related to left arm and left leg decreased sensation, sensory neuropathy  EXAM: CT CHEST, ABDOMEN, AND PELVIS WITH CONTRAST  TECHNIQUE: Multidetector CT imaging of the chest, abdomen and pelvis was performed following the standard  protocol during bolus administration of intravenous contrast.  CONTRAST:  151mL OMNIPAQUE IOHEXOL 300 MG/ML  SOLN  COMPARISON:  None.  FINDINGS: CT CHEST FINDINGS  There are borderline mediastinal lymph nodes come with a 10 mm aortopulmonary window lymph node as the largest. There is calcification of the aortic arch. There is coronary arterial calcification and moderate cardiac enlargement. There is a very small hiatal hernia. There is no significant pleural or pericardial effusion.  Thyroid is not identified. There are surgical clips in the region of the right thyroid. No significant pulmonary parenchymal opacification on the right identified. On the left, there is subsegmental atelectasis along the diaphragmatic surface of the lower lobe. The left lung is otherwise clear.  CT ABDOMEN AND PELVIS FINDINGS  There is a 33 x 35 x 28 mm solid enhancing mass arising from the upper pole the right kidney, within average attenuation value of 78. Right kidney is otherwise normal. Right renal vein is normal. Left kidney is normal.  There is hepatic steatosis of moderate severity. The spleen is normal. The adrenal glands are normal. The pancreas is atrophied with mild prominence of the pancreatic duct. There is a 19 mm cystic lesion in the tail of the pancreas and a 22 mm cystic lesion in the neck of the pancreas.  There is calcification of the abdominal aorta without dilatation. There is no significant retroperitoneal adenopathy. There is no significant mesenteric adenopathy.  Stomach and small bowel are normal. Appendix is normal. There is moderate diverticulosis of the sigmoid colon without evidence of diverticulitis.  Bladder is normal. Uterus is not identified. Ovaries appear normal except for a few small nonspecific calcifications involving both ovaries. There is a small fat containing right inguinal hernia.  There is no free fluid or inflammatory change in the abdomen or pelvis. There are no acute musculoskeletal  findings. Patient is status post prior fusion at L4-5 level.  IMPRESSION: 1. No acute abnormalities in the thorax. Borderline mediastinal lymph nodes. 2. Mass upper pole right kidney highly concerning for renal cell carcinoma. 3. Pancreatic cystic lesions with pancreatic ductal prominence. These may represent benign cysts or pseudocysts. However, given the size of the larger, further characterization with MRI is suggested if the patient would be a candidate for optimal MRI imaging. If the patient is not, then follow up CT in six to twelve months is suggested. This recommendation follows ACR consensus guidelines: Managing Incidental Findings on Abdominal CT: White Paper of the ACR Incidental Findings Committee. J Am Coll Radiol 2010;7:754-773 4. Sigmoid colon diverticulosis These results will be called to the ordering clinician or representative by the Radiologist Assistant, and communication documented in the PACS or zVision Dashboard.   Electronically Signed   By: Kyung Rudd  Rubner M.D.   On: 09/12/2014 08:28   Mr Jodene Nam Head/brain Wo Cm  09/11/2014   CLINICAL DATA:  Severe left-sided headache with left-sided numbness and tingling. Transient blurred vision. History of hypertension and diabetes.  EXAM: MRI HEAD WITHOUT CONTRAST  MRA HEAD WITHOUT CONTRAST  TECHNIQUE: Multiplanar, multiecho pulse sequences of the brain and surrounding structures were obtained without intravenous contrast. Angiographic images of the head were obtained using MRA technique without contrast.  COMPARISON:  Head CT 09/11/2014  FINDINGS: MRI HEAD FINDINGS  There is an 8 mm acute infarct in the right thalamus. There is no evidence of intracranial hemorrhage, mass, midline shift, or extra-axial fluid collection. Ventricles and sulci are normal. Patchy T2 hyperintensities in the cerebral white matter are nonspecific but compatible with mild-to-moderate chronic small vessel ischemic disease. Small, chronic infarcts are noted in the left pons,  right thalamus, and left basal ganglia.  Orbits are unremarkable. Paranasal sinuses and mastoid air cells are clear. Major intracranial vascular flow voids are preserved.  MRA HEAD FINDINGS  Visualized distal vertebral arteries are patent with the right being dominant. There is irregularity and mild narrowing of the distal left vertebral artery. PICA and SCA origins are patent. Basilar artery is patent with minimal narrowing in its midportion. PCAs are unremarkable. There is a patent left posterior communicating artery.  Internal carotid arteries are patent from skullbase to carotid termini without stenosis. There is a small bulge measuring approximately 3 mm extending inferomedially from the right internal carotid artery near the ophthalmic artery origin which may reflect a tiny aneurysm, although there is mild motion artifact through this area. M1 and A1 segments are patent without stenosis. There is mild bilateral A2 and MCA branch vessel irregularity suggestive of atherosclerosis.  IMPRESSION: 1. Acute right thalamic infarct. 2. Mild to moderate chronic small vessel ischemic disease. 3. Chronic lacunar infarcts in the deep gray nuclei and pons. 4. No major intracranial arterial occlusion. Intracranial atherosclerosis with mild narrowing of the distal left vertebral artery and minimal mid basilar narrowing. 5. 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   Electronically Signed   By: Logan Bores   On: 09/11/2014 19:11   Carotid Doppler  There is 1-39% bilateral ICA stenosis. Vertebral artery flow is antegrade.     PHYSICAL EXAM Obese middle aged african american lady not in distress.Awake alert. Afebrile. Head is nontraumatic. Neck is supple without bruit. Hearing is normal. Cardiac exam no murmur or gallop. Lungs are clear to auscultation. Distal pulses are well felt. Neurological Exam ;  Awake  Alert oriented x 3. Normal speech and language.eye movements full without nystagmus.fundi were not  visualized. Vision acuity and fields appear normal. Hearing is normal. Palatal movements are normal. Face symmetric. Tongue midline. Normal strength, tone, reflexes and coordination. Decreased left body sensation including part of face. Gait deferred. ASSESSMENT/PLAN Kaylee Mullins is a 79 y.o. 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 She did not receive IV t-PA due to delay in arrrival.   Stroke:  right thalamic infarct secondary to small vessel disease source  Resultant  Left body hemisensory deficit  MRI  R thalamic infarct, small vessel disease old lacumes deep grey nuclei and pons  MRA  No significant large vessel stenosis, possible 35mm R paraopthalmic aneurysm (no further assessment, treatment indicated)  Carotid Doppler  No significant stenosis   2D Echo  pending   Heparin 5000 units sq tid for VTE prophylaxis  Diet Carb Modified thin liquids  aspirin 81 mg orally every day prior to admission, would typically recommend change to plaivx, however given possible upcoming kidney surgery, will continue on aspirin 325 mg orally every day  Ongoing aggressive stroke risk factor management  Therapy recommendations:  No OT  Disposition:  Anticipate return home  Hypertension, accelerated  As high as 232/89  Permissive hypertension (OK if < 220/120) but gradually normalize in 5-7 days   Hyperlipidemia  Home meds:  crestor 20mg   LDL pending, goal < 70  Resume statin (ordered)  Continue statin at discharge  Diabetes  HgbA1c pending , goal < 7.0  Other Stroke Risk Factors  Advanced age  Obesity, Body mass index is 39.63 kg/(m^2).   Family hx stroke (sister)  Other Active Problems  R renal mass, ? Cancer, workup underway  Hospital day # [place] for Pager information 09/12/2014 1:56 PM  I have  personally examined this patient, reviewed notes, independently viewed imaging studies, participated in medical decision making and plan of care. I have made any additions or clarifications directly to the above note. Agree with note above. She has had a right lateral thalamic infarct secondary to small vessel disease and has multiple vascular risk factors-HT, Hyperlipidimia obesity. She remains at high risk for recurrent TIAs/stroke .She needs aggressive risk factor modification and workup. She has a right renal mass which may need surgery hence we will keep her on aspirin and not change to Plavix.  Antony Contras, MD Medical Director [facility] Pager: [phone] 09/12/2014 2:42 PM   To contact Stroke Continuity provider, please refer to [email or web address] After hours, contact General Neurology

## 2014-09-12 NOTE — Progress Notes (Signed)
PROGRESS NOTE  Kaylee Mullins QQI:[id number] DOB: 1936-03-31 DOA: 09/11/2014 PCP: Kaylee Fendt, MD  HPI Kaylee Mullins is a 79 y.o. female, who presented to the ER with complaints of left-sided severe headache with numbness and tingling of the left side, mostly her left arm/hand and thigh. MRI positive for right thalamic stroke.  MRI revealed a stroke in the thalamic region.  Along with ordering an MRI, neurology also ordered an abdominal CT looking for a nerve entanglement to explain the peripheral nerve symptoms she was experiencing.  Incidentally a mass in the upper pole of the right kidney highly concerning for renal cell carcinoma was found.   Subjective - no specific complaints today, she denies chest pain, SOB or weakness, with ongoing numbness  Assessment/Plan: CVA (cerebral infarction) - Initial CT negative; MRI/MRA revealed an infarct in the right thalamic region - Carotid duplex and 2-D echocardiogram pending - Neurology following - Lipid panel pending - Hemoglobin A1c 8.6 - receiving novolog injections and canagliflozininpatient, continue to monitor   - Taking aspirin - Was on statin prior to admission - SLP,PT and OT evaluations per stroke protocol - No hemorrhagic changes on initial CT - started heparin for DVT prophylaxis on 1/26  Mass in the upper pole of the right kidney  - Mass found incidentally on abdominal/pelvic CT - Urology (Dr. Diona Mullins) consulted on 1/27  Uncontrolled hypertension - permissive hypertension for now - Last reading: 181/68   Diabetes mellitus - Hemoglobin A1c 8.6  - Receiving sliding scale insulin injections and canagliflozin inpatient - Glucose during admission running in 200's - Continue to monitor - CBG checks before meals and hour of sleep were ordered on 1/26   Postsurgical hypothyroidism - Continue home dose Synthroid - Followed by an endocrinologist (Dr. Buddy Mullins)   Dyslipidemia - Continue Crestor  Osteoarthritis -  Taking tylenol, morphine for pain  DVT Prophylaxis:  Heparin SQ  Code Status: Full Family Communication: Niece present at bedside Disposition Plan: Remain inpatient  Consultants:  Neurology  Urology  Procedures:  Bilateral carotid artery duplex 09/12/14: 1-39% ICA stenosis. Vertebral artery flow is antegrade.  2D echo ordered  Antibiotics: None   Objective: Filed Vitals:   09/12/14 0000 09/12/14 0200 09/12/14 0400 09/12/14 1014  BP: 232/89 187/107 195/78 181/68  Pulse: 72 61 67 66  Temp: 98.1 F (36.7 C) 98 F (36.7 C) 98.4 F (36.9 C) 98 F (36.7 C)  TempSrc: Oral Oral Oral Oral  Resp: 22 22 20 20   Height:      Weight:      SpO2: 100% 100% 96% 98%   No intake or output data in the 24 hours ending 09/12/14 1317 Filed Weights   09/11/14 1046  Weight: 104.781 kg (231 lb)   Exam: General: Well developed, well nourished, NAD, appears stated age, very active in conversations and movement for physical exam, very pleasant  HEENT:  Anicteic Sclera, MMM.  Neck: Supple, no JVD Cardiovascular: RRR, S1 S2 auscultated, no rubs, murmurs or gallops.   Respiratory: Clear to auscultation bilaterally with equal chest rise  Abdomen: Soft, nontender, nondistended, + bowel sounds  Extremities: warm dry without cyanosis clubbing or edema.  Neuro: AAOx3. Strength 5/5 in upper and lower extremities, smile slightly asymmetric on right  Skin: Without rashes exudates or nodules.   Psych: Normal affect and demeanor with intact judgement and insight   Data Reviewed: Basic Metabolic Panel:  Recent Labs Lab 09/11/14 1138 09/11/14 1145 09/12/14 0502  NA 139 142 139  K  3.7 3.9 4.0  CL 107 105 103  CO2 25  --  24  GLUCOSE 210* 209* 203*  BUN 17 22 10   CREATININE 0.84 0.70 0.66  CALCIUM 8.9  --  8.8   Liver Function Tests:  Recent Labs Lab 09/11/14 1138  AST 25  ALT 20  ALKPHOS 56  BILITOT 0.7  PROT 7.5  ALBUMIN 3.5   CBC:  Recent Labs Lab 09/11/14 1138  09/11/14 1145  WBC 5.3  --   NEUTROABS 2.8  --   HGB 12.7 14.3  HCT 38.4 42.0  MCV 93.2  --   PLT 212  --    CBG:  Recent Labs Lab 09/11/14 1106 09/11/14 1943 09/11/14 2105 09/11/14 2237 09/12/14 1123  GLUCAP 181* 177* 300* 224* 210*    No results found for this or any previous visit (from the past 240 hour(s)).   Studies: Ct Head Wo Contrast  09/11/2014   CLINICAL DATA:  79 year old female with history diabetes and high blood pressure presenting with headache and left-sided weakness. Initial encounter.  EXAM: CT HEAD WITHOUT CONTRAST  TECHNIQUE: Contiguous axial images were obtained from the base of the skull through the vertex without intravenous contrast.  COMPARISON:  No comparison head CT.  FINDINGS: No intracranial hemorrhage.  Small vessel disease type changes without CT evidence of large acute infarct.  No hydrocephalus.  No intracranial mass lesion noted on this unenhanced exam.  Vascular calcifications.  Mastoid air cells, middle ear cavities and visualized paranasal sinuses are clear.  Orbital structures unremarkable.  IMPRESSION: No intracranial hemorrhage or CT evidence of large acute infarct.  Small vessel disease type changes.  Please see above.   Electronically Signed   By: Chauncey Cruel M.D.   On: 09/11/2014 12:22   Ct Chest W Contrast  09/12/2014   CLINICAL DATA:  Initial evaluation for possbile paraneoplastic syndrome related to left arm and left leg decreased sensation, sensory neuropathy  EXAM: CT CHEST, ABDOMEN, AND PELVIS WITH CONTRAST  TECHNIQUE: Multidetector CT imaging of the chest, abdomen and pelvis was performed following the standard protocol during bolus administration of intravenous contrast.  CONTRAST:  134mL OMNIPAQUE IOHEXOL 300 MG/ML  SOLN  COMPARISON:  None.  FINDINGS: CT CHEST FINDINGS  There are borderline mediastinal lymph nodes come with a 10 mm aortopulmonary window lymph node as the largest. There is calcification of the aortic arch.  There is coronary arterial calcification and moderate cardiac enlargement. There is a very small hiatal hernia. There is no significant pleural or pericardial effusion.  Thyroid is not identified. There are surgical clips in the region of the right thyroid. No significant pulmonary parenchymal opacification on the right identified. On the left, there is subsegmental atelectasis along the diaphragmatic surface of the lower lobe. The left lung is otherwise clear.  CT ABDOMEN AND PELVIS FINDINGS  There is a 33 x 35 x 28 mm solid enhancing mass arising from the upper pole the right kidney, within average attenuation value of 78. Right kidney is otherwise normal. Right renal vein is normal. Left kidney is normal.  There is hepatic steatosis of moderate severity. The spleen is normal. The adrenal glands are normal. The pancreas is atrophied with mild prominence of the pancreatic duct. There is a 19 mm cystic lesion in the tail of the pancreas and a 22 mm cystic lesion in the neck of the pancreas.  There is calcification of the abdominal aorta without dilatation. There is no significant retroperitoneal adenopathy. There  is no significant mesenteric adenopathy.  Stomach and small bowel are normal. Appendix is normal. There is moderate diverticulosis of the sigmoid colon without evidence of diverticulitis.  Bladder is normal. Uterus is not identified. Ovaries appear normal except for a few small nonspecific calcifications involving both ovaries. There is a small fat containing right inguinal hernia.  There is no free fluid or inflammatory change in the abdomen or pelvis. There are no acute musculoskeletal findings. Patient is status post prior fusion at L4-5 level.  IMPRESSION: 1. No acute abnormalities in the thorax. Borderline mediastinal lymph nodes. 2. Mass upper pole right kidney highly concerning for renal cell carcinoma. 3. Pancreatic cystic lesions with pancreatic ductal prominence. These may represent benign cysts  or pseudocysts. However, given the size of the larger, further characterization with MRI is suggested if the patient would be a candidate for optimal MRI imaging. If the patient is not, then follow up CT in six to twelve months is suggested. This recommendation follows ACR consensus guidelines: Managing Incidental Findings on Abdominal CT: White Paper of the ACR Incidental Findings Committee. J Am Coll Radiol 2010;7:754-773 4. Sigmoid colon diverticulosis These results will be called to the ordering clinician or representative by the Radiologist Assistant, and communication documented in the PACS or zVision Dashboard.   Electronically Signed   By: Skipper Cliche M.D.   On: 09/12/2014 08:28   Mr Brain Wo Contrast  09/11/2014   CLINICAL DATA:  Severe left-sided headache with left-sided numbness and tingling. Transient blurred vision. History of hypertension and diabetes.  EXAM: MRI HEAD WITHOUT CONTRAST  MRA HEAD WITHOUT CONTRAST  TECHNIQUE: Multiplanar, multiecho pulse sequences of the brain and surrounding structures were obtained without intravenous contrast. Angiographic images of the head were obtained using MRA technique without contrast.  COMPARISON:  Head CT 09/11/2014  FINDINGS: MRI HEAD FINDINGS  There is an 8 mm acute infarct in the right thalamus. There is no evidence of intracranial hemorrhage, mass, midline shift, or extra-axial fluid collection. Ventricles and sulci are normal. Patchy T2 hyperintensities in the cerebral white matter are nonspecific but compatible with mild-to-moderate chronic small vessel ischemic disease. Small, chronic infarcts are noted in the left pons, right thalamus, and left basal ganglia.  Orbits are unremarkable. Paranasal sinuses and mastoid air cells are clear. Major intracranial vascular flow voids are preserved.  MRA HEAD FINDINGS  Visualized distal vertebral arteries are patent with the right being dominant. There is irregularity and mild narrowing of the distal left  vertebral artery. PICA and SCA origins are patent. Basilar artery is patent with minimal narrowing in its midportion. PCAs are unremarkable. There is a patent left posterior communicating artery.  Internal carotid arteries are patent from skullbase to carotid termini without stenosis. There is a small bulge measuring approximately 3 mm extending inferomedially from the right internal carotid artery near the ophthalmic artery origin which may reflect a tiny aneurysm, although there is mild motion artifact through this area. M1 and A1 segments are patent without stenosis. There is mild bilateral A2 and MCA branch vessel irregularity suggestive of atherosclerosis.  IMPRESSION: 1. Acute right thalamic infarct. 2. Mild to moderate chronic small vessel ischemic disease. 3. Chronic lacunar infarcts in the deep gray nuclei and pons. 4. No major intracranial arterial occlusion. Intracranial atherosclerosis with mild narrowing of the distal left vertebral artery and minimal mid basilar narrowing. 5. 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   Electronically Signed   By: Logan Bores   On: 09/11/2014 19:11  Mr Cervical Spine Wo Contrast  09/11/2014   CLINICAL DATA:  Severe left-sided headache with left-sided numbness and tingling.  EXAM: MRI CERVICAL SPINE WITHOUT CONTRAST  TECHNIQUE: Multiplanar, multisequence MR imaging of the cervical spine was performed. No intravenous contrast was administered.  COMPARISON:  Neck CT 07/29/2005  FINDINGS: Images are mildly degraded by motion artifact. There is straightening of the normal cervical lordosis with trace retrolisthesis of C4 on C5 and C5 on C6. There is moderate to severe disc space narrowing at C4-5 and C5-6 with moderate narrowing at C6-7. Associated degenerative endplate changes are present. A hemangioma is noted in the C6 vertebral body. Craniocervical junction is unremarkable. Cervical spinal cord is normal in caliber and signal. Paraspinal soft tissues are  unremarkable.  C2-3: Shallow central disc protrusion and right greater than left facet arthrosis without stenosis.  C3-4: Broad-based posterior disc osteophyte complex and right facet arthrosis result in mild bilateral neural foraminal stenosis without spinal stenosis.  C4-5: Broad-based posterior disc osteophyte complex results in borderline spinal stenosis and moderate to severe bilateral neural foraminal stenosis.  C5-6: Broad-based posterior disc osteophyte complex results in mild spinal stenosis and severe bilateral neural foraminal stenosis.  C6-7: Broad-based posterior disc osteophyte complex results in borderline spinal stenosis and moderate to severe bilateral neural foraminal stenosis.  C7-T1:  Negative.  IMPRESSION: Moderate to severe multilevel cervical disc degeneration, most notable at C5-6 where there is mild spinal stenosis and severe bilateral neural foraminal stenosis.   Electronically Signed   By: Logan Bores   On: 09/11/2014 19:33   Ct Abdomen Pelvis W Contrast  09/12/2014   CLINICAL DATA:  Initial evaluation for possbile paraneoplastic syndrome related to left arm and left leg decreased sensation, sensory neuropathy  EXAM: CT CHEST, ABDOMEN, AND PELVIS WITH CONTRAST  TECHNIQUE: Multidetector CT imaging of the chest, abdomen and pelvis was performed following the standard protocol during bolus administration of intravenous contrast.  CONTRAST:  166mL OMNIPAQUE IOHEXOL 300 MG/ML  SOLN  COMPARISON:  None.  FINDINGS: CT CHEST FINDINGS  There are borderline mediastinal lymph nodes come with a 10 mm aortopulmonary window lymph node as the largest. There is calcification of the aortic arch. There is coronary arterial calcification and moderate cardiac enlargement. There is a very small hiatal hernia. There is no significant pleural or pericardial effusion.  Thyroid is not identified. There are surgical clips in the region of the right thyroid. No significant pulmonary parenchymal opacification on  the right identified. On the left, there is subsegmental atelectasis along the diaphragmatic surface of the lower lobe. The left lung is otherwise clear.  CT ABDOMEN AND PELVIS FINDINGS  There is a 33 x 35 x 28 mm solid enhancing mass arising from the upper pole the right kidney, within average attenuation value of 78. Right kidney is otherwise normal. Right renal vein is normal. Left kidney is normal.  There is hepatic steatosis of moderate severity. The spleen is normal. The adrenal glands are normal. The pancreas is atrophied with mild prominence of the pancreatic duct. There is a 19 mm cystic lesion in the tail of the pancreas and a 22 mm cystic lesion in the neck of the pancreas.  There is calcification of the abdominal aorta without dilatation. There is no significant retroperitoneal adenopathy. There is no significant mesenteric adenopathy.  Stomach and small bowel are normal. Appendix is normal. There is moderate diverticulosis of the sigmoid colon without evidence of diverticulitis.  Bladder is normal. Uterus is not identified. Ovaries appear  normal except for a few small nonspecific calcifications involving both ovaries. There is a small fat containing right inguinal hernia.  There is no free fluid or inflammatory change in the abdomen or pelvis. There are no acute musculoskeletal findings. Patient is status post prior fusion at L4-5 level.  IMPRESSION: 1. No acute abnormalities in the thorax. Borderline mediastinal lymph nodes. 2. Mass upper pole right kidney highly concerning for renal cell carcinoma. 3. Pancreatic cystic lesions with pancreatic ductal prominence. These may represent benign cysts or pseudocysts. However, given the size of the larger, further characterization with MRI is suggested if the patient would be a candidate for optimal MRI imaging. If the patient is not, then follow up CT in six to twelve months is suggested. This recommendation follows ACR consensus guidelines: Managing  Incidental Findings on Abdominal CT: White Paper of the ACR Incidental Findings Committee. J Am Coll Radiol 2010;7:754-773 4. Sigmoid colon diverticulosis These results will be called to the ordering clinician or representative by the Radiologist Assistant, and communication documented in the PACS or zVision Dashboard.   Electronically Signed   By: Skipper Cliche M.D.   On: 09/12/2014 08:28   Mr Jodene Nam Head/brain Wo Cm  09/11/2014   CLINICAL DATA:  Severe left-sided headache with left-sided numbness and tingling. Transient blurred vision. History of hypertension and diabetes.  EXAM: MRI HEAD WITHOUT CONTRAST  MRA HEAD WITHOUT CONTRAST  TECHNIQUE: Multiplanar, multiecho pulse sequences of the brain and surrounding structures were obtained without intravenous contrast. Angiographic images of the head were obtained using MRA technique without contrast.  COMPARISON:  Head CT 09/11/2014  FINDINGS: MRI HEAD FINDINGS  There is an 8 mm acute infarct in the right thalamus. There is no evidence of intracranial hemorrhage, mass, midline shift, or extra-axial fluid collection. Ventricles and sulci are normal. Patchy T2 hyperintensities in the cerebral white matter are nonspecific but compatible with mild-to-moderate chronic small vessel ischemic disease. Small, chronic infarcts are noted in the left pons, right thalamus, and left basal ganglia.  Orbits are unremarkable. Paranasal sinuses and mastoid air cells are clear. Major intracranial vascular flow voids are preserved.  MRA HEAD FINDINGS  Visualized distal vertebral arteries are patent with the right being dominant. There is irregularity and mild narrowing of the distal left vertebral artery. PICA and SCA origins are patent. Basilar artery is patent with minimal narrowing in its midportion. PCAs are unremarkable. There is a patent left posterior communicating artery.  Internal carotid arteries are patent from skullbase to carotid termini without stenosis. There is a small  bulge measuring approximately 3 mm extending inferomedially from the right internal carotid artery near the ophthalmic artery origin which may reflect a tiny aneurysm, although there is mild motion artifact through this area. M1 and A1 segments are patent without stenosis. There is mild bilateral A2 and MCA branch vessel irregularity suggestive of atherosclerosis.  IMPRESSION: 1. Acute right thalamic infarct. 2. Mild to moderate chronic small vessel ischemic disease. 3. Chronic lacunar infarcts in the deep gray nuclei and pons. 4. No major intracranial arterial occlusion. Intracranial atherosclerosis with mild narrowing of the distal left vertebral artery and minimal mid basilar narrowing. 5. 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   Electronically Signed   By: Logan Bores   On: 09/11/2014 19:11    Scheduled Meds: .  stroke: mapping our early stages of recovery book   Does not apply Once  . aspirin  300 mg Rectal Daily   Or  . aspirin  325 mg  Oral Daily  . canagliflozin  100 mg Oral Daily  . diltiazem  360 mg Oral Daily  . heparin  5,000 Units Subcutaneous 3 times per day  . hydrochlorothiazide  25 mg Oral Daily  . insulin aspart  0-15 Units Subcutaneous TID WC  . insulin aspart  0-5 Units Subcutaneous QHS  . insulin aspart  4 Units Subcutaneous TID WC  . irbesartan  150 mg Oral Daily  . levothyroxine  200 mcg Oral Daily  . losartan  100 mg Oral Daily  . metoprolol  100 mg Oral BID   Continuous Infusions:   Active Problems:   Diabetes mellitus   Dyslipidemia   CVA (cerebral infarction)   Osteoarthritis   Postsurgical hypothyroidism   Uncontrolled hypertension   CVA (cerebral vascular accident)  Chrys Racer E. Howell-Methvin, PA-S   Baltasar Twilley M. Cruzita Lederer, MD [facility] [phone] If 7PM-7AM, please contact night-coverage at [email or web address], password [email or web address] 09/12/2014, 1:17 PM  LOS: 1 day

## 2014-09-13 DIAGNOSIS — N2889 Other specified disorders of kidney and ureter: Secondary | ICD-10-CM

## 2014-09-13 DIAGNOSIS — I639 Cerebral infarction, unspecified: Secondary | ICD-10-CM | POA: Insufficient documentation

## 2014-09-13 HISTORY — DX: Other specified disorders of kidney and ureter: N28.89

## 2014-09-13 LAB — LIPID PANEL
Cholesterol: 149 mg/dL (ref 0–200)
HDL: 50 mg/dL (ref >39)
LDL Cholesterol: 73 mg/dL (ref 0–99)
Total CHOL/HDL Ratio: 3 RATIO
Triglycerides: 131 mg/dL (ref <150)
VLDL: 26 mg/dL (ref 0–40)

## 2014-09-13 LAB — GLUCOSE, CAPILLARY
Glucose-Capillary: 194 mg/dL — ABNORMAL HIGH (ref 70–99)
Glucose-Capillary: 235 mg/dL — ABNORMAL HIGH (ref 70–99)

## 2014-09-13 MED ORDER — POLYETHYLENE GLYCOL 3350 17 G PO PACK
17.0000 g | PACK | Freq: Two times a day (BID) | ORAL | Status: DC
  Filled 2014-09-13: qty 1

## 2014-09-13 MED ORDER — CANAGLIFLOZIN 100 MG PO TABS: 100.0000 mg | ORAL_TABLET | Freq: Every day | ORAL | Status: DC

## 2014-09-13 MED ORDER — ASPIRIN 325 MG PO TABS: 325.0000 mg | ORAL_TABLET | Freq: Every day | ORAL | Status: DC

## 2014-09-13 NOTE — Progress Notes (Signed)
  Echocardiogram 2D Echocardiogram has been performed.  Kaylee Mullins M 09/13/2014, 2:48 PM

## 2014-09-13 NOTE — Discharge Summary (Signed)
Physician Discharge Summary  SHAKARI QAZI CXK:[id number] DOB: 1936-01-04 DOA: 09/11/2014  PCP: Philis Fendt, MD  Admit date: 09/11/2014 Discharge date: 09/13/2014  Time spent: 30 minutes  Recommendations for Outpatient Follow-up:  1. Please follow up with Dr. Leonie Man in 1 month 2. Please follow up with Dr. Mikey Bussing in 1-2 weeks 3. Please follow up with Dr. Buddy Duty in 1-2 weeks 4. Please follow up with PCP in 1 month  Discharge Diagnoses:  Active Problems:   Diabetes mellitus   Dyslipidemia   CVA (cerebral infarction)   Osteoarthritis   Postsurgical hypothyroidism   Uncontrolled hypertension   CVA (cerebral vascular accident)   Right renal mass  Discharge Condition: Stable  Diet recommendation: Heart healthy  Filed Weights   09/11/14 1046  Weight: 104.781 kg (231 lb)    History of present illness:  Lynia Landry is a 79 y.o. female, who presented to the ER with complaints of left-sided severe headache with numbness and tingling of the left side, mostly her left arm/hand and thigh. MRI positive for right thalamic stroke. MRI revealed a stroke in the thalamic region. Along with ordering an MRI, neurology also ordered an abdominal CT looking for a nerve entanglement to explain the peripheral nerve symptoms she was experiencing. Incidentally a mass in the upper pole of the right kidney highly concerning for renal cell carcinoma was found.   Hospital Course:  CVA (cerebral infarction) - Initial CT negative; MRI/MRA revealed an infarct in the right thalamic region. Neurology was consulted and have followed patient while hospitalized.  - Carotid duplex: 1-39% ICA stenosis.Vertebral artery flow is antegrade. - 2-D echocardiogram completed 09/13/14, EF 50-55% and grade 2 diastolic dysfunction - Lipid panel within normal limits - Hemoglobin A1c 8.6 - this to be further followed by by Dr. Dagmar Hait, she is on U500.  - Taking aspirin Mass in the upper pole of the right kidney- Mass  found incidentally on abdominal/pelvic CT, Urology (Dr. Diona Fanti) consulted on 1/27, recommended outpatient follow up in clinic. Given potential future surgical intervention, will not start Plavix for now. Uncontrolled hypertension - resume home medications Diabetes mellitus - outpatient follow up Postsurgical hypothyroidism - Continue home dose Synthroid, outpatient follow up Dyslipidemia - Continue Crestor Osteoarthritis - Taking tylenol, morphine for pain  Procedures:  Bilateral carotid artery duplex 09/12/14: 1-39% ICA stenosis. Vertebral artery flow is antegrade.  2D echo   Consultations:  Neurology  Urology  Discharge Exam: Filed Vitals:   09/13/14 1011  BP: 161/56  Pulse: 65  Temp: 98.4 F (36.9 C)  Resp: 20    General:Well developed, well nourished, NAD, appears stated age, sitting up in chair with hair and makeup done, very pleasant Neck: Supple Neck, No JVD, No cervical lymphadenopathy appreciated Respiratory: Symmetrical Chest wall movement, Good air movement bilaterally, CTAB. Cardiac: RRR, No Gallops, Rubs or Murmurs, No Parasternal Heave. Abdomen: Abdomen Soft, Non tender, No organomegaly appriciated Extremities: Good muscle tone, no edema. Psych: Normal affect and insight Neuro: Decreased on the left side of the face, left arm, and along part of her palm Awake Alert, Oriented X 3.  Discharge Instructions  Discharge Medication List as of 09/13/2014  6:40 PM    CONTINUE these medications which have CHANGED   Details  aspirin 325 MG tablet Take 1 tablet (325 mg total) by mouth daily., Starting 09/13/2014, Until Discontinued, Print      CONTINUE these medications which have NOT CHANGED   Details  diltiazem (TIAZAC) 360 MG 24 hr capsule Take 360 mg  by mouth daily., Until Discontinued, Historical Med    insulin regular human CONCENTRATED (HUMULIN R) 500 UNIT/ML SOLN injection Inject 100 Units into the skin 2 (two) times daily with a meal., Until  Discontinued, Historical Med    levothyroxine (SYNTHROID, LEVOTHROID) 200 MCG tablet Take 200 mcg by mouth daily., Until Discontinued, Historical Med    metoprolol succinate (TOPROL-XL) 100 MG 24 hr tablet Take 200 mg by mouth daily. Take with or immediately following a meal., Until Discontinued, Historical Med    rosuvastatin (CRESTOR) 20 MG tablet Take 20 mg by mouth daily., Until Discontinued, Historical Med    telmisartan (MICARDIS) 40 MG tablet Take 40 mg by mouth daily., Until Discontinued, Historical Med    insulin aspart (NOVOLOG) 100 UNIT/ML injection Inject into the skin 2 (two) times daily with a meal., Until Discontinued, Historical Med      STOP taking these medications     Canagliflozin (INVOKANA) 100 MG TABS      metoprolol (LOPRESSOR) 100 MG tablet        Allergies  Allergen Reactions  . Nyquil Multi-Symptom [Pseudoeph-Doxylamine-Dm-Apap] Other (See Comments)    Makes pt not "feel right in her head"  . Darvon [Propoxyphene Hcl]     hallucinations   Follow-up Information    Follow up with Jorja Loa, MD.   Specialty:  Urology   Why:  Call for appointment for consultation after discharge   Contact information:   [place] [address] [phone]        The results of significant diagnostics from this hospitalization (including imaging, microbiology, ancillary and laboratory) are listed below for reference.    Significant Diagnostic Studies: Ct Head Wo Contrast  09/11/2014   CLINICAL DATA:  79 year old female with history diabetes and high blood pressure presenting with headache and left-sided weakness. Initial encounter.  EXAM: CT HEAD WITHOUT CONTRAST  TECHNIQUE: Contiguous axial images were obtained from the base of the skull through the vertex without intravenous contrast.  COMPARISON:  No comparison head CT.  FINDINGS: No intracranial hemorrhage.  Small vessel disease type changes without CT evidence of large acute  infarct.  No hydrocephalus.  No intracranial mass lesion noted on this unenhanced exam.  Vascular calcifications.  Mastoid air cells, middle ear cavities and visualized paranasal sinuses are clear.  Orbital structures unremarkable.  IMPRESSION: No intracranial hemorrhage or CT evidence of large acute infarct.  Small vessel disease type changes.  Please see above.   Electronically Signed   By: Chauncey Cruel M.D.   On: 09/11/2014 12:22   Ct Chest W Contrast  09/12/2014   CLINICAL DATA:  Initial evaluation for possbile paraneoplastic syndrome related to left arm and left leg decreased sensation, sensory neuropathy  EXAM: CT CHEST, ABDOMEN, AND PELVIS WITH CONTRAST  TECHNIQUE: Multidetector CT imaging of the chest, abdomen and pelvis was performed following the standard protocol during bolus administration of intravenous contrast.  CONTRAST:  172mL OMNIPAQUE IOHEXOL 300 MG/ML  SOLN  COMPARISON:  None.  FINDINGS: CT CHEST FINDINGS  There are borderline mediastinal lymph nodes come with a 10 mm aortopulmonary window lymph node as the largest. There is calcification of the aortic arch. There is coronary arterial calcification and moderate cardiac enlargement. There is a very small hiatal hernia. There is no significant pleural or pericardial effusion.  Thyroid is not identified. There are surgical clips in the region of the right thyroid. No significant pulmonary parenchymal opacification on the right identified. On the left,  there is subsegmental atelectasis along the diaphragmatic surface of the lower lobe. The left lung is otherwise clear.  CT ABDOMEN AND PELVIS FINDINGS  There is a 33 x 35 x 28 mm solid enhancing mass arising from the upper pole the right kidney, within average attenuation value of 78. Right kidney is otherwise normal. Right renal vein is normal. Left kidney is normal.  There is hepatic steatosis of moderate severity. The spleen is normal. The adrenal glands are normal. The pancreas is atrophied with  mild prominence of the pancreatic duct. There is a 19 mm cystic lesion in the tail of the pancreas and a 22 mm cystic lesion in the neck of the pancreas.  There is calcification of the abdominal aorta without dilatation. There is no significant retroperitoneal adenopathy. There is no significant mesenteric adenopathy.  Stomach and small bowel are normal. Appendix is normal. There is moderate diverticulosis of the sigmoid colon without evidence of diverticulitis.  Bladder is normal. Uterus is not identified. Ovaries appear normal except for a few small nonspecific calcifications involving both ovaries. There is a small fat containing right inguinal hernia.  There is no free fluid or inflammatory change in the abdomen or pelvis. There are no acute musculoskeletal findings. Patient is status post prior fusion at L4-5 level.  IMPRESSION: 1. No acute abnormalities in the thorax. Borderline mediastinal lymph nodes. 2. Mass upper pole right kidney highly concerning for renal cell carcinoma. 3. Pancreatic cystic lesions with pancreatic ductal prominence. These may represent benign cysts or pseudocysts. However, given the size of the larger, further characterization with MRI is suggested if the patient would be a candidate for optimal MRI imaging. If the patient is not, then follow up CT in six to twelve months is suggested. This recommendation follows ACR consensus guidelines: Managing Incidental Findings on Abdominal CT: White Paper of the ACR Incidental Findings Committee. J Am Coll Radiol 2010;7:754-773 4. Sigmoid colon diverticulosis These results will be called to the ordering clinician or representative by the Radiologist Assistant, and communication documented in the PACS or zVision Dashboard.   Electronically Signed   By: Skipper Cliche M.D.   On: 09/12/2014 08:28   Mr Brain Wo Contrast  09/11/2014   CLINICAL DATA:  Severe left-sided headache with left-sided numbness and tingling. Transient blurred vision.  History of hypertension and diabetes.  EXAM: MRI HEAD WITHOUT CONTRAST  MRA HEAD WITHOUT CONTRAST  TECHNIQUE: Multiplanar, multiecho pulse sequences of the brain and surrounding structures were obtained without intravenous contrast. Angiographic images of the head were obtained using MRA technique without contrast.  COMPARISON:  Head CT 09/11/2014  FINDINGS: MRI HEAD FINDINGS  There is an 8 mm acute infarct in the right thalamus. There is no evidence of intracranial hemorrhage, mass, midline shift, or extra-axial fluid collection. Ventricles and sulci are normal. Patchy T2 hyperintensities in the cerebral white matter are nonspecific but compatible with mild-to-moderate chronic small vessel ischemic disease. Small, chronic infarcts are noted in the left pons, right thalamus, and left basal ganglia.  Orbits are unremarkable. Paranasal sinuses and mastoid air cells are clear. Major intracranial vascular flow voids are preserved.  MRA HEAD FINDINGS  Visualized distal vertebral arteries are patent with the right being dominant. There is irregularity and mild narrowing of the distal left vertebral artery. PICA and SCA origins are patent. Basilar artery is patent with minimal narrowing in its midportion. PCAs are unremarkable. There is a patent left posterior communicating artery.  Internal carotid arteries are patent from skullbase to  carotid termini without stenosis. There is a small bulge measuring approximately 3 mm extending inferomedially from the right internal carotid artery near the ophthalmic artery origin which may reflect a tiny aneurysm, although there is mild motion artifact through this area. M1 and A1 segments are patent without stenosis. There is mild bilateral A2 and MCA branch vessel irregularity suggestive of atherosclerosis.  IMPRESSION: 1. Acute right thalamic infarct. 2. Mild to moderate chronic small vessel ischemic disease. 3. Chronic lacunar infarcts in the deep gray nuclei and pons. 4. No major  intracranial arterial occlusion. Intracranial atherosclerosis with mild narrowing of the distal left vertebral artery and minimal mid basilar narrowing. 5. 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   Electronically Signed   By: Logan Bores   On: 09/11/2014 19:11   Mr Cervical Spine Wo Contrast  09/11/2014   CLINICAL DATA:  Severe left-sided headache with left-sided numbness and tingling.  EXAM: MRI CERVICAL SPINE WITHOUT CONTRAST  TECHNIQUE: Multiplanar, multisequence MR imaging of the cervical spine was performed. No intravenous contrast was administered.  COMPARISON:  Neck CT 07/29/2005  FINDINGS: Images are mildly degraded by motion artifact. There is straightening of the normal cervical lordosis with trace retrolisthesis of C4 on C5 and C5 on C6. There is moderate to severe disc space narrowing at C4-5 and C5-6 with moderate narrowing at C6-7. Associated degenerative endplate changes are present. A hemangioma is noted in the C6 vertebral body. Craniocervical junction is unremarkable. Cervical spinal cord is normal in caliber and signal. Paraspinal soft tissues are unremarkable.  C2-3: Shallow central disc protrusion and right greater than left facet arthrosis without stenosis.  C3-4: Broad-based posterior disc osteophyte complex and right facet arthrosis result in mild bilateral neural foraminal stenosis without spinal stenosis.  C4-5: Broad-based posterior disc osteophyte complex results in borderline spinal stenosis and moderate to severe bilateral neural foraminal stenosis.  C5-6: Broad-based posterior disc osteophyte complex results in mild spinal stenosis and severe bilateral neural foraminal stenosis.  C6-7: Broad-based posterior disc osteophyte complex results in borderline spinal stenosis and moderate to severe bilateral neural foraminal stenosis.  C7-T1:  Negative.  IMPRESSION: Moderate to severe multilevel cervical disc degeneration, most notable at C5-6 where there is mild spinal stenosis and  severe bilateral neural foraminal stenosis.   Electronically Signed   By: Logan Bores   On: 09/11/2014 19:33   Ct Abdomen Pelvis W Contrast  09/12/2014   CLINICAL DATA:  Initial evaluation for possbile paraneoplastic syndrome related to left arm and left leg decreased sensation, sensory neuropathy  EXAM: CT CHEST, ABDOMEN, AND PELVIS WITH CONTRAST  TECHNIQUE: Multidetector CT imaging of the chest, abdomen and pelvis was performed following the standard protocol during bolus administration of intravenous contrast.  CONTRAST:  164mL OMNIPAQUE IOHEXOL 300 MG/ML  SOLN  COMPARISON:  None.  FINDINGS: CT CHEST FINDINGS  There are borderline mediastinal lymph nodes come with a 10 mm aortopulmonary window lymph node as the largest. There is calcification of the aortic arch. There is coronary arterial calcification and moderate cardiac enlargement. There is a very small hiatal hernia. There is no significant pleural or pericardial effusion.  Thyroid is not identified. There are surgical clips in the region of the right thyroid. No significant pulmonary parenchymal opacification on the right identified. On the left, there is subsegmental atelectasis along the diaphragmatic surface of the lower lobe. The left lung is otherwise clear.  CT ABDOMEN AND PELVIS FINDINGS  There is a 33 x 35 x 28 mm solid enhancing mass  arising from the upper pole the right kidney, within average attenuation value of 78. Right kidney is otherwise normal. Right renal vein is normal. Left kidney is normal.  There is hepatic steatosis of moderate severity. The spleen is normal. The adrenal glands are normal. The pancreas is atrophied with mild prominence of the pancreatic duct. There is a 19 mm cystic lesion in the tail of the pancreas and a 22 mm cystic lesion in the neck of the pancreas.  There is calcification of the abdominal aorta without dilatation. There is no significant retroperitoneal adenopathy. There is no significant mesenteric  adenopathy.  Stomach and small bowel are normal. Appendix is normal. There is moderate diverticulosis of the sigmoid colon without evidence of diverticulitis.  Bladder is normal. Uterus is not identified. Ovaries appear normal except for a few small nonspecific calcifications involving both ovaries. There is a small fat containing right inguinal hernia.  There is no free fluid or inflammatory change in the abdomen or pelvis. There are no acute musculoskeletal findings. Patient is status post prior fusion at L4-5 level.  IMPRESSION: 1. No acute abnormalities in the thorax. Borderline mediastinal lymph nodes. 2. Mass upper pole right kidney highly concerning for renal cell carcinoma. 3. Pancreatic cystic lesions with pancreatic ductal prominence. These may represent benign cysts or pseudocysts. However, given the size of the larger, further characterization with MRI is suggested if the patient would be a candidate for optimal MRI imaging. If the patient is not, then follow up CT in six to twelve months is suggested. This recommendation follows ACR consensus guidelines: Managing Incidental Findings on Abdominal CT: White Paper of the ACR Incidental Findings Committee. J Am Coll Radiol 2010;7:754-773 4. Sigmoid colon diverticulosis These results will be called to the ordering clinician or representative by the Radiologist Assistant, and communication documented in the PACS or zVision Dashboard.   Electronically Signed   By: Skipper Cliche M.D.   On: 09/12/2014 08:28   Dg Bone Density  08/22/2014   CLINICAL DATA:  Postmenopausal. Patient takes vitamin-D.  EXAM: DUAL X-RAY ABSORPTIOMETRY (DXA) FOR BONE MINERAL DENSITY  FINDINGS: AP LUMBAR SPINE (L1-L3)  Bone Mineral Density (BMD):  1.515 G/cm2  Young Adult T-Score:  4.5  Z-Score:  6.3  LEFT FEMUR NECK  Bone Mineral Density (BMD):  1.089 g/cm2  Young Adult T-Score: 2.2  Z-Score:  2.6  ASSESSMENT: Patient's diagnostic category is NORMAL by WHO Criteria.  FRACTURE RISK:  NOT INCREASED  FRAX: World Health Organization FRAX assessment of absolute fracture risk is not calculated for this patient because the patient has all T-scores at or above -1.0.  COMPARISON: None.  Effective therapies are available in the form of bisphosphonates, selective estrogen receptor modulators, biologic agents, and hormone replacement therapy (for women). All patients should ensure an adequate intake of dietary calcium (1200 mg daily) and vitamin D (800 IU daily) unless contraindicated.  All treatment decisions require clinical judgment and consideration of individual patient factors, including patient preferences, co-morbidities, previous drug use, risk factors not captured in the FRAX model (e.g., frailty, falls, vitamin D deficiency, increased bone turnover, interval significant decline in bone density) and possible under- or over-estimation of fracture risk by FRAX.  The National Osteoporosis Foundation recommends that FDA-approved medical therapies be considered in postmenopausal women and men age 21 or older with a:  1. Hip or vertebral (clinical or morphometric) fracture.  2. T-score of -2.5 or lower at the spine or hip.  3. Ten-year fracture probability by FRAX of 3%  or greater for hip fracture or 20% or greater for major osteoporotic fracture.  People with diagnosed cases of osteoporosis or at high risk for fracture should have regular bone mineral density tests. For patients eligible for Medicare, routine testing is allowed once every 2 years. The testing frequency can be increased to one year for patients who have rapidly progressing disease, those who are receiving or discontinuing medical therapy to restore bone mass, or have additional risk factors.  World Pharmacologist [facility]) Criteria:  Normal: T-scores from +1.0 to -1.0  Low Bone Mass (Osteopenia): T-scores between -1.0 and -2.5  Osteoporosis: T-scores -2.5 and below  Comparison to Reference Population:  T-score is the key measure used  in the diagnosis of osteoporosis and relative risk determination for fracture. It provides a value for bone mass relative to the mean bone mass of a young adult reference population expressed in terms of standard deviation (SD).  Z-score is the age-matched score showing the patient's values compared to a population matched for age, sex, and race. This is also expressed in terms of standard deviation. The patient may have values that compare favorably to the age-matched values and still be at increased risk for fracture.   Electronically Signed   By: Lillia Mountain M.D.   On: 08/22/2014 13:01   Mr Jodene Nam Head/brain Wo Cm  09/11/2014   CLINICAL DATA:  Severe left-sided headache with left-sided numbness and tingling. Transient blurred vision. History of hypertension and diabetes.  EXAM: MRI HEAD WITHOUT CONTRAST  MRA HEAD WITHOUT CONTRAST  TECHNIQUE: Multiplanar, multiecho pulse sequences of the brain and surrounding structures were obtained without intravenous contrast. Angiographic images of the head were obtained using MRA technique without contrast.  COMPARISON:  Head CT 09/11/2014  FINDINGS: MRI HEAD FINDINGS  There is an 8 mm acute infarct in the right thalamus. There is no evidence of intracranial hemorrhage, mass, midline shift, or extra-axial fluid collection. Ventricles and sulci are normal. Patchy T2 hyperintensities in the cerebral white matter are nonspecific but compatible with mild-to-moderate chronic small vessel ischemic disease. Small, chronic infarcts are noted in the left pons, right thalamus, and left basal ganglia.  Orbits are unremarkable. Paranasal sinuses and mastoid air cells are clear. Major intracranial vascular flow voids are preserved.  MRA HEAD FINDINGS  Visualized distal vertebral arteries are patent with the right being dominant. There is irregularity and mild narrowing of the distal left vertebral artery. PICA and SCA origins are patent. Basilar artery is patent with minimal narrowing in  its midportion. PCAs are unremarkable. There is a patent left posterior communicating artery.  Internal carotid arteries are patent from skullbase to carotid termini without stenosis. There is a small bulge measuring approximately 3 mm extending inferomedially from the right internal carotid artery near the ophthalmic artery origin which may reflect a tiny aneurysm, although there is mild motion artifact through this area. M1 and A1 segments are patent without stenosis. There is mild bilateral A2 and MCA branch vessel irregularity suggestive of atherosclerosis.  IMPRESSION: 1. Acute right thalamic infarct. 2. Mild to moderate chronic small vessel ischemic disease. 3. Chronic lacunar infarcts in the deep gray nuclei and pons. 4. No major intracranial arterial occlusion. Intracranial atherosclerosis with mild narrowing of the distal left vertebral artery and minimal mid basilar narrowing. 5. 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   Electronically Signed   By: Logan Bores   On: 09/11/2014 19:11    Labs: Basic Metabolic Panel:  Recent Labs Lab 09/11/14 1138 09/11/14  1145 09/12/14 0502  NA 139 142 139  K 3.7 3.9 4.0  CL 107 105 103  CO2 25  --  24  GLUCOSE 210* 209* 203*  BUN 17 22 10   CREATININE 0.84 0.70 0.66  CALCIUM 8.9  --  8.8   Liver Function Tests:  Recent Labs Lab 09/11/14 1138  AST 25  ALT 20  ALKPHOS 56  BILITOT 0.7  PROT 7.5  ALBUMIN 3.5   CBC:  Recent Labs Lab 09/11/14 1138 09/11/14 1145  WBC 5.3  --   NEUTROABS 2.8  --   HGB 12.7 14.3  HCT 38.4 42.0  MCV 93.2  --   PLT 212  --    CBG:  Recent Labs Lab 09/12/14 1123 09/12/14 1710 09/12/14 2047 09/13/14 0628 09/13/14 1146  GLUCAP 210* 157* 167* 194* 235*    Signed: Costin M. Cruzita Lederer, MD [facility] [phone]

## 2014-09-13 NOTE — Discharge Instructions (Signed)
Follow with AVBUERE,EDWIN A, MD in 5-7 days ° °Please get a complete blood count and chemistry panel checked by your Primary MD at your next visit, and again as instructed by your Primary MD. Please get your medications reviewed and adjusted by your Primary MD. ° °Please request your Primary MD to go over all Hospital Tests and Procedure/Radiological results at the follow up, please get all Hospital records sent to your Prim MD by signing hospital release before you go home. ° °If you had Pneumonia of Lung problems at the Hospital: °Please get a 2 view Chest X ray done in 6-8 weeks after hospital discharge or sooner if instructed by your Primary MD. ° °If you have Congestive Heart Failure: °Please call your Cardiologist or Primary MD anytime you have any of the following symptoms:  °1) 3 pound weight gain in 24 hours or 5 pounds in 1 week  °2) shortness of breath, with or without a dry hacking cough  °3) swelling in the hands, feet or stomach  °4) if you have to sleep on extra pillows at night in order to breathe ° °Follow cardiac low salt diet and 1.5 lit/day fluid restriction. ° °If you have diabetes °Accuchecks 4 times/day, Once in AM empty stomach and then before each meal. °Log in all results and show them to your primary doctor at your next visit. °If any glucose reading is under 80 or above 300 call your primary MD immediately. ° °If you have Seizure/Convulsions/Epilepsy: °Please do not drive, operate heavy machinery, participate in activities at heights or participate in high speed sports until you have seen by Primary MD or a Neurologist and advised to do so again. ° °If you had Gastrointestinal Bleeding: °Please ask your Primary MD to check a complete blood count within one week of discharge or at your next visit. Your endoscopic/colonoscopic biopsies that are pending at the time of discharge, will also need to followed by your Primary MD. ° °Get Medicines reviewed and adjusted. °Please take all your  medications with you for your next visit with your Primary MD ° °Please request your Primary MD to go over all hospital tests and procedure/radiological results at the follow up, please ask your Primary MD to get all Hospital records sent to his/her office. ° °If you experience worsening of your admission symptoms, develop shortness of breath, life threatening emergency, suicidal or homicidal thoughts you must seek medical attention immediately by calling 911 or calling your MD immediately  if symptoms less severe. ° °You must read complete instructions/literature along with all the possible adverse reactions/side effects for all the Medicines you take and that have been prescribed to you. Take any new Medicines after you have completely understood and accpet all the possible adverse reactions/side effects.  ° °Do not drive or operate heavy machinery when taking Pain medications.  ° °Do not take more than prescribed Pain, Sleep and Anxiety Medications ° °Special Instructions: If you have smoked or chewed Tobacco  in the last 2 yrs please stop smoking, stop any regular Alcohol  and or any Recreational drug use. ° °Wear Seat belts while driving. ° °Please note °You were cared for by a hospitalist during your hospital stay. If you have any questions about your discharge medications or the care you received while you were in the hospital after you are discharged, you can call the unit and asked to speak with the hospitalist on call if the hospitalist that took care of you is not available. Once   you are discharged, your primary care physician will handle any further medical issues. Please note that NO REFILLS for any discharge medications will be authorized once you are discharged, as it is imperative that you return to your primary care physician (or establish a relationship with a primary care physician if you do not have one) for your aftercare needs so that they can reassess your need for medications and monitor your  lab values.  You can reach the hospitalist office at phone [phone] or fax [phone]   If you do not have a primary care physician, you can call [phone] for a physician referral.  Activity: As tolerated with Full fall precautions use walker/cane & assistance as needed  Diet: heart healthy/diabetic  Disposition Home

## 2014-09-13 NOTE — Progress Notes (Signed)
Pt states she had a bowel movement already today. She wants to wait until later to take her MiraLax. Will continue to monitor.

## 2014-09-13 NOTE — Progress Notes (Signed)
Discharge orders received. Pt for discharge home today. IV d/c'd. Pt given discharge instructions with verbalized understanding. Family in room to assist with discharge. Staff brought pt downstairs via wheelchair.   

## 2014-09-13 NOTE — Progress Notes (Signed)
Inpatient Diabetes Program Recommendations  AACE/ADA: New Consensus Statement on Inpatient Glycemic Control (2013)  Target Ranges:  Prepandial:   less than 140 mg/dL      Peak postprandial:   less than 180 mg/dL (1-2 hours)      Critically ill patients:  140 - 180 mg/dL   Results for SHELITA, STEPTOE (MRN [id number]) as of 09/13/2014 11:25  Ref. Range 09/12/2014 11:23 09/12/2014 17:10 09/12/2014 20:47 09/13/2014 06:28  Glucose-Capillary Latest Range: 70-99 mg/dL 210 (H) 157 (H) 167 (H) 194 (H)    Diabetes history: Diabetes Mellitus Outpatient Diabetes medications: U500 insulin 20 units bid (5 times more concentrated than U100 therefore equals 100 units bid), Invokana 100 mg daily Current orders for Inpatient glycemic control:  Novolog moderate tid with meals and HS, Novolog 4 units tid with meals,  Invokana 100 mg daily  Fasting CBG remains elevated- may want to consider adding basal insulin starting with 10 units Lantus q day.   Gentry Fitz, RN, BA, MHA, CDE Diabetes Coordinator Inpatient Diabetes Program  [phone] (Team Pager) [phone] [facility]) 09/13/2014 11:30 AM   May consider adding a portion of basal insulin to current regimen. Consider adding Levemir 30 units daily.

## 2014-09-14 LAB — GLUCOSE, CAPILLARY
Glucose-Capillary: 221 mg/dL — ABNORMAL HIGH (ref 70–99)
Glucose-Capillary: 122 mg/dL — ABNORMAL HIGH (ref 70–99)

## 2014-09-14 LAB — HEMOGLOBIN A1C
Hgb A1c MFr Bld: 8.9 % — ABNORMAL HIGH (ref 4.8–5.6)
Mean Plasma Glucose: 209 mg/dL

## 2014-09-16 ENCOUNTER — Inpatient Hospital Stay (HOSPITAL_COMMUNITY)
Admission: EM | Admit: 2014-09-16 | Discharge: 2014-09-18 | DRG: 065 | Disposition: A | Discharge status: Home Health | Payer: Medicare Other | Attending: Internal Medicine | Admitting: Internal Medicine

## 2014-09-16 ENCOUNTER — Inpatient Hospital Stay (HOSPITAL_COMMUNITY): Payer: Medicare Other

## 2014-09-16 ENCOUNTER — Encounter (HOSPITAL_COMMUNITY): Payer: Self-pay | Admitting: Emergency Medicine

## 2014-09-16 ENCOUNTER — Emergency Department (HOSPITAL_COMMUNITY): Payer: Medicare Other

## 2014-09-16 DIAGNOSIS — Z8673 Personal history of transient ischemic attack (TIA), and cerebral infarction without residual deficits: Secondary | ICD-10-CM

## 2014-09-16 DIAGNOSIS — E669 Obesity, unspecified: Secondary | ICD-10-CM | POA: Diagnosis not present

## 2014-09-16 DIAGNOSIS — E785 Hyperlipidemia, unspecified: Secondary | ICD-10-CM | POA: Diagnosis present

## 2014-09-16 DIAGNOSIS — Z6839 Body mass index (BMI) 39.0-39.9, adult: Secondary | ICD-10-CM | POA: Diagnosis not present

## 2014-09-16 DIAGNOSIS — E119 Type 2 diabetes mellitus without complications: Secondary | ICD-10-CM | POA: Diagnosis not present

## 2014-09-16 DIAGNOSIS — I639 Cerebral infarction, unspecified: Principal | ICD-10-CM | POA: Diagnosis present

## 2014-09-16 DIAGNOSIS — Z794 Long term (current) use of insulin: Secondary | ICD-10-CM

## 2014-09-16 DIAGNOSIS — E890 Postprocedural hypothyroidism: Secondary | ICD-10-CM | POA: Diagnosis present

## 2014-09-16 DIAGNOSIS — G8194 Hemiplegia, unspecified affecting left nondominant side: Secondary | ICD-10-CM | POA: Diagnosis present

## 2014-09-16 DIAGNOSIS — E1159 Type 2 diabetes mellitus with other circulatory complications: Secondary | ICD-10-CM | POA: Diagnosis not present

## 2014-09-16 DIAGNOSIS — Z79899 Other long term (current) drug therapy: Secondary | ICD-10-CM | POA: Diagnosis not present

## 2014-09-16 DIAGNOSIS — R200 Anesthesia of skin: Secondary | ICD-10-CM | POA: Insufficient documentation

## 2014-09-16 DIAGNOSIS — Z7982 Long term (current) use of aspirin: Secondary | ICD-10-CM | POA: Diagnosis not present

## 2014-09-16 DIAGNOSIS — R29810 Facial weakness: Secondary | ICD-10-CM

## 2014-09-16 DIAGNOSIS — R2689 Other abnormalities of gait and mobility: Secondary | ICD-10-CM | POA: Diagnosis not present

## 2014-09-16 DIAGNOSIS — I10 Essential (primary) hypertension: Secondary | ICD-10-CM | POA: Diagnosis not present

## 2014-09-16 DIAGNOSIS — R269 Unspecified abnormalities of gait and mobility: Secondary | ICD-10-CM | POA: Diagnosis present

## 2014-09-16 DIAGNOSIS — I63311 Cerebral infarction due to thrombosis of right middle cerebral artery: Secondary | ICD-10-CM | POA: Diagnosis not present

## 2014-09-16 DIAGNOSIS — Z85850 Personal history of malignant neoplasm of thyroid: Secondary | ICD-10-CM | POA: Diagnosis not present

## 2014-09-16 DIAGNOSIS — H547 Unspecified visual loss: Secondary | ICD-10-CM | POA: Diagnosis not present

## 2014-09-16 DIAGNOSIS — N2889 Other specified disorders of kidney and ureter: Secondary | ICD-10-CM | POA: Diagnosis not present

## 2014-09-16 HISTORY — DX: Cerebral infarction, unspecified: I63.9

## 2014-09-16 LAB — RAPID URINE DRUG SCREEN, HOSP PERFORMED
Amphetamines: NOT DETECTED
Barbiturates: NOT DETECTED
Benzodiazepines: NOT DETECTED
Cocaine: NOT DETECTED
Opiates: NOT DETECTED
Tetrahydrocannabinol: NOT DETECTED

## 2014-09-16 LAB — CBC WITH DIFFERENTIAL/PLATELET
Basophils Absolute: 0 10*3/uL (ref 0.0–0.1)
Basophils Relative: 0 % (ref 0–1)
Eosinophils Absolute: 0 10*3/uL (ref 0.0–0.7)
Eosinophils Relative: 1 % (ref 0–5)
HCT: 39.4 % (ref 36.0–46.0)
Hemoglobin: 12.8 g/dL (ref 12.0–15.0)
Lymphocytes Relative: 29 % (ref 12–46)
Lymphs Abs: 1.5 10*3/uL (ref 0.7–4.0)
MCH: 30.3 pg (ref 26.0–34.0)
MCHC: 32.5 g/dL (ref 30.0–36.0)
MCV: 93.4 fL (ref 78.0–100.0)
Monocytes Absolute: 0.4 10*3/uL (ref 0.1–1.0)
Monocytes Relative: 9 % (ref 3–12)
Neutro Abs: 3.2 10*3/uL (ref 1.7–7.7)
Neutrophils Relative %: 61 % (ref 43–77)
Platelets: 236 10*3/uL (ref 150–400)
RBC: 4.22 MIL/uL (ref 3.87–5.11)
RDW: 14.4 % (ref 11.5–15.5)
WBC: 5.2 10*3/uL (ref 4.0–10.5)

## 2014-09-16 LAB — URINALYSIS, ROUTINE W REFLEX MICROSCOPIC
Bilirubin Urine: NEGATIVE
Glucose, UA: NEGATIVE mg/dL
Hgb urine dipstick: NEGATIVE
Ketones, ur: NEGATIVE mg/dL
Leukocytes, UA: NEGATIVE
Nitrite: NEGATIVE
Protein, ur: NEGATIVE mg/dL
Specific Gravity, Urine: 1.019 (ref 1.005–1.030)
Urobilinogen, UA: 0.2 mg/dL (ref 0.0–1.0)
pH: 5.5 (ref 5.0–8.0)

## 2014-09-16 LAB — COMPREHENSIVE METABOLIC PANEL
ALT: 21 U/L (ref 0–35)
AST: 25 U/L (ref 0–37)
Albumin: 3.6 g/dL (ref 3.5–5.2)
Alkaline Phosphatase: 57 U/L (ref 39–117)
Anion gap: 9 (ref 5–15)
BUN: 22 mg/dL (ref 6–23)
CO2: 24 mmol/L (ref 19–32)
Calcium: 8.9 mg/dL (ref 8.4–10.5)
Chloride: 105 mmol/L (ref 96–112)
Creatinine, Ser: 0.83 mg/dL (ref 0.50–1.10)
GFR calc Af Amer: 77 mL/min — ABNORMAL LOW (ref >90)
GFR calc non Af Amer: 67 mL/min — ABNORMAL LOW (ref >90)
Glucose, Bld: 230 mg/dL — ABNORMAL HIGH (ref 70–99)
Potassium: 3.9 mmol/L (ref 3.5–5.1)
Sodium: 138 mmol/L (ref 135–145)
Total Bilirubin: 0.9 mg/dL (ref 0.3–1.2)
Total Protein: 7.1 g/dL (ref 6.0–8.3)

## 2014-09-16 LAB — APTT: aPTT: 32 seconds (ref 24–37)

## 2014-09-16 LAB — GLUCOSE, CAPILLARY
Glucose-Capillary: 162 mg/dL — ABNORMAL HIGH (ref 70–99)
Glucose-Capillary: 238 mg/dL — ABNORMAL HIGH (ref 70–99)

## 2014-09-16 LAB — ETHANOL: Alcohol, Ethyl (B): <5 mg/dL (ref 0–9)

## 2014-09-16 LAB — TROPONIN I: Troponin I: <0.03 ng/mL (ref <0.031)

## 2014-09-16 MED ORDER — DILTIAZEM HCL ER BEADS 240 MG PO CP24
360.0000 mg | ORAL_CAPSULE | Freq: Every day | ORAL | Status: DC
  Administered 2014-09-17: 360 mg via ORAL
  Filled 2014-09-16 (×2): qty 1

## 2014-09-16 MED ORDER — INSULIN ASPART 100 UNIT/ML ~~LOC~~ SOLN: 0.0000 [IU] | Freq: Three times a day (TID) | SUBCUTANEOUS | Status: DC

## 2014-09-16 MED ORDER — ACETAMINOPHEN 325 MG PO TABS
650.0000 mg | ORAL_TABLET | Freq: Once | ORAL | Status: AC
  Administered 2014-09-16: 650 mg via ORAL
  Filled 2014-09-16: qty 2

## 2014-09-16 MED ORDER — LEVOTHYROXINE SODIUM 100 MCG PO TABS
200.0000 ug | ORAL_TABLET | Freq: Every day | ORAL | Status: DC
  Administered 2014-09-17 – 2014-09-18 (×2): 200 ug via ORAL
  Filled 2014-09-16: qty 2
  Filled 2014-09-16: qty 4

## 2014-09-16 MED ORDER — LABETALOL HCL 5 MG/ML IV SOLN
20.0000 mg | INTRAVENOUS | Status: DC | PRN
  Administered 2014-09-16: 20 mg via INTRAVENOUS
  Filled 2014-09-16 (×2): qty 4

## 2014-09-16 MED ORDER — INSULIN GLARGINE 100 UNIT/ML ~~LOC~~ SOLN
20.0000 [IU] | Freq: Two times a day (BID) | SUBCUTANEOUS | Status: DC
  Administered 2014-09-16 – 2014-09-18 (×4): 20 [IU] via SUBCUTANEOUS
  Filled 2014-09-16 (×5): qty 0.2

## 2014-09-16 MED ORDER — INSULIN ASPART 100 UNIT/ML ~~LOC~~ SOLN
0.0000 [IU] | Freq: Three times a day (TID) | SUBCUTANEOUS | Status: DC
  Administered 2014-09-17: 4 [IU] via SUBCUTANEOUS
  Administered 2014-09-17: 7 [IU] via SUBCUTANEOUS
  Administered 2014-09-17 – 2014-09-18 (×3): 4 [IU] via SUBCUTANEOUS

## 2014-09-16 MED ORDER — SODIUM CHLORIDE 0.9 % IV SOLN: INTRAVENOUS | Status: DC

## 2014-09-16 MED ORDER — METOPROLOL SUCCINATE ER 100 MG PO TB24
200.0000 mg | ORAL_TABLET | Freq: Every day | ORAL | Status: DC
  Administered 2014-09-17 – 2014-09-18 (×2): 200 mg via ORAL
  Filled 2014-09-16 (×2): qty 2

## 2014-09-16 MED ORDER — SODIUM CHLORIDE 0.9 % IV BOLUS (SEPSIS)
500.0000 mL | Freq: Once | INTRAVENOUS | Status: AC
  Administered 2014-09-16: 500 mL via INTRAVENOUS

## 2014-09-16 MED ORDER — IRBESARTAN 150 MG PO TABS
150.0000 mg | ORAL_TABLET | Freq: Every day | ORAL | Status: DC
  Administered 2014-09-17: 150 mg via ORAL
  Filled 2014-09-16: qty 1

## 2014-09-16 MED ORDER — SODIUM CHLORIDE 0.9 % IV SOLN
100.0000 mL/h | INTRAVENOUS | Status: DC
  Administered 2014-09-16 (×2): 100 mL/h via INTRAVENOUS

## 2014-09-16 MED ORDER — CLOPIDOGREL BISULFATE 75 MG PO TABS
75.0000 mg | ORAL_TABLET | Freq: Every day | ORAL | Status: DC
  Administered 2014-09-16 – 2014-09-18 (×3): 75 mg via ORAL
  Filled 2014-09-16 (×3): qty 1

## 2014-09-16 MED ORDER — ROSUVASTATIN CALCIUM 20 MG PO TABS
20.0000 mg | ORAL_TABLET | Freq: Every day | ORAL | Status: DC
  Administered 2014-09-16 – 2014-09-18 (×3): 20 mg via ORAL
  Filled 2014-09-16 (×3): qty 1

## 2014-09-16 MED ORDER — ASPIRIN 325 MG PO TABS
325.0000 mg | ORAL_TABLET | Freq: Every day | ORAL | Status: DC
  Administered 2014-09-17: 325 mg via ORAL
  Filled 2014-09-16: qty 1

## 2014-09-16 MED ORDER — INSULIN REGULAR HUMAN (CONC) 500 UNIT/ML ~~LOC~~ SOLN
75.0000 [IU] | Freq: Two times a day (BID) | SUBCUTANEOUS | Status: DC
Start: 2014-09-16 — End: 2014-09-16

## 2014-09-16 MED ORDER — ENOXAPARIN SODIUM 40 MG/0.4ML ~~LOC~~ SOLN
40.0000 mg | SUBCUTANEOUS | Status: DC
  Administered 2014-09-16 – 2014-09-17 (×2): 40 mg via SUBCUTANEOUS
  Filled 2014-09-16 (×2): qty 0.4

## 2014-09-16 MED ORDER — STROKE: EARLY STAGES OF RECOVERY BOOK
Freq: Once | Status: AC
  Administered 2014-09-16: 1

## 2014-09-16 MED ORDER — SENNOSIDES-DOCUSATE SODIUM 8.6-50 MG PO TABS: 1.0000 | ORAL_TABLET | Freq: Every evening | ORAL | Status: DC | PRN

## 2014-09-16 MED ORDER — FENTANYL CITRATE 0.05 MG/ML IJ SOLN
50.0000 ug | Freq: Once | INTRAMUSCULAR | Status: AC
  Administered 2014-09-16: 50 ug via INTRAVENOUS
  Filled 2014-09-16: qty 2

## 2014-09-16 NOTE — ED Provider Notes (Signed)
CSN: [id number]     Arrival date & time 09/16/14  1032 History   First MD Initiated Contact with Patient 09/16/14 1033     Chief Complaint  Patient presents with  . Numbness  . Dizziness    HPI  Patient presents today as after discharge following admission for stroke, now with recurrence of left-sided dysesthesia. Patient had fallen stroke diagnosed last week, which manifested with left-sided weakness. Patient notes that since discharge 2 days ago she's been doing generally well until approximately 8 hours ago, about the time she develops new dysesthesia in the left side, with a slightly enlarged distribution compared to her prior deficits. No new visual loss, confusion, disorientation, aphasia.  She does complain of new gait unsteadiness. She denies any new falls, trauma. She denies new this coordination. She states that she takes all medication since discharge as directed, including full strength aspirin. She has not followed-up with her neurologist, nor with primary care yet. During that admission she was also found to have a new right adrenal lesion. This has not had additional evaluation thus far.   Past Medical History  Diagnosis Date  . Hypertension   . Hypothyroidism   . Diabetes mellitus     type !  . Cancer     thyroid cancer   Past Surgical History  Procedure Laterality Date  . Thyroidectomy  2005  . Abdominal hysterectomy    . Tonsillectomy    . Lumbar spine surgery    . Flexible sigmoidoscopy  03/29/2012    Procedure: FLEXIBLE SIGMOIDOSCOPY;  Surgeon: Jeryl Columbia, MD;  Location: [facility] ENDOSCOPY;  Service: Endoscopy;  Laterality: N/A;  fleet enema upon arrival  . Hot hemostasis  03/29/2012    Procedure: HOT HEMOSTASIS (ARGON PLASMA COAGULATION/BICAP);  Surgeon: Jeryl Columbia, MD;  Location: [facility] ENDOSCOPY;  Service: Endoscopy;  Laterality: N/A;   Family History  Problem Relation Age of Onset  . Heart failure Mother   . Heart attack Father    History  Substance Use  Topics  . Smoking status: Never Smoker   . Smokeless tobacco: Not on file  . Alcohol Use: No   OB History    No data available     Review of Systems  Constitutional:       Per HPI, otherwise negative  HENT:       Per HPI, otherwise negative  Respiratory:       Per HPI, otherwise negative  Cardiovascular:       Per HPI, otherwise negative  Gastrointestinal: Negative for vomiting.  Endocrine:       Negative aside from HPI  Genitourinary:       Neg aside from HPI   Musculoskeletal:       Per HPI, otherwise negative  Skin: Negative.   Neurological: Positive for dizziness and numbness. Negative for tremors, seizures, syncope, speech difficulty, weakness and headaches.      Allergies  Nyquil multi-symptom and Darvon  Home Medications   Prior to Admission medications   Medication Sig Start Date End Date Taking? Authorizing Provider  aspirin 325 MG tablet Take 1 tablet (325 mg total) by mouth daily. 09/13/14  Yes Costin Karlyne Greenspan, MD  diltiazem (TIAZAC) 360 MG 24 hr capsule Take 360 mg by mouth daily.   Yes Historical Provider, MD  insulin regular human CONCENTRATED (HUMULIN R) 500 UNIT/ML SOLN injection Inject 100 Units into the skin 2 (two) times daily with a meal.   Yes Historical Provider, MD  levothyroxine (SYNTHROID,  LEVOTHROID) 200 MCG tablet Take 200 mcg by mouth daily.   Yes Historical Provider, MD  metoprolol succinate (TOPROL-XL) 100 MG 24 hr tablet Take 200 mg by mouth daily. Take with or immediately following a meal.   Yes Historical Provider, MD  rosuvastatin (CRESTOR) 20 MG tablet Take 20 mg by mouth daily.   Yes Historical Provider, MD  telmisartan (MICARDIS) 40 MG tablet Take 40 mg by mouth daily.   Yes Historical Provider, MD   BP 175/57 mmHg  Pulse 63  Temp(Src) 97.5 F (36.4 C)  Resp 16  Ht 5\' 4"  (1.626 m)  Wt 231 lb (104.781 kg)  BMI 39.63 kg/m2  SpO2 98% Physical Exam  Constitutional: She is oriented to person, place, and time. She appears  well-developed and well-nourished. No distress.  HENT:  Head: Normocephalic and atraumatic.  Eyes: Conjunctivae and EOM are normal.  Cardiovascular: Normal rate and regular rhythm.   Pulmonary/Chest: Effort normal and breath sounds normal. No stridor. No respiratory distress.  Abdominal: She exhibits no distension.  Musculoskeletal: She exhibits no edema.  Neurological: She is alert and oriented to person, place, and time. She displays no atrophy and no tremor. No cranial nerve deficit. She exhibits normal muscle tone. She displays no seizure activity. Coordination normal.  Patient describes decreased sensation throughout the L side, w/o objective deficit.  Skin: Skin is warm and dry.  Psychiatric: She has a normal mood and affect.  Nursing note and vitals reviewed.   ED Course  Procedures (including critical care time) Labs Review Labs Reviewed  COMPREHENSIVE METABOLIC PANEL - Abnormal; Notable for the following:    Glucose, Bld 230 (*)    GFR calc non Af Amer 67 (*)    GFR calc Af Amer 77 (*)    All other components within normal limits  APTT  CBC WITH DIFFERENTIAL/PLATELET  ETHANOL  TROPONIN I  URINALYSIS, ROUTINE W REFLEX MICROSCOPIC  URINE RAPID DRUG SCREEN (HOSP PERFORMED)    On re-check there seems to be possible new loss of R naso-labial fold. Speech is clear.  Imaging Review Ct Head Wo Contrast  09/16/2014   CLINICAL DATA:  Patient discharged 09/11/2014 history of cerebrovascular accident. Now with with numbness in the left side including the left arm and leg.  EXAM: CT HEAD WITHOUT CONTRAST  TECHNIQUE: Contiguous axial images were obtained from the base of the skull through the vertex without intravenous contrast.  COMPARISON:  Brain MRI and head CT scans 09/11/2014.  FINDINGS: Chronic microvascular ischemic change is again identified. No evidence of acute infarction, hemorrhage, mass lesion, mass effect, midline shift or abnormal extra-axial fluid collection is  identified. Hypoattenuation in the right thalamus is consistent with expected evolutionary change of the infarct seen on the prior MRI. The calvarium is intact.  IMPRESSION: No acute abnormality.  Chronic microvascular ischemic change and expected evolutionary change of a small right thalamic infarct seen on comparison MRI.   Electronically Signed   By: Inge Rise M.D.   On: 09/16/2014 12:28     EKG Interpretation   Date/Time:  Sunday September 16 2014 10:49:52 EST Ventricular Rate:  64 PR Interval:  167 QRS Duration: 79 QT Interval:  425 QTC Calculation: 438 R Axis:   4 Text Interpretation:  Sinus rhythm Left ventricular hypertrophy Borderline  T abnormalities, lateral leads Sinus rhythm Left ventricular hypertrophy T  wave abnormality Abnormal ekg Confirmed by Carmin Muskrat  MD ([id number]) on  09/16/2014 11:08:24 AM     1:50 PM Patient aware  of all results.  I have already discussed patient's case with our neurology team. Given the patient's concern for progression of her symptoms, as well as CT evidence of evolution of her stroke, patient will be admitted for observation. With our neurologists assistance, we agreed to initiate Plavix for dual stroke prophylaxis.  MDM  Patient was asked with left-sided dysesthesia, eventually has some facial asymmetry as well. Patient has no CT evidence for new stroke, but some evidence for continued stroke effects.  The patient's blood pressure improved here, she remained otherwise stable, though she does have need for additional evaluation as an outpatient of her possible adrenal lesion. She was admitted for further evaluation, management.    Carmin Muskrat, MD 09/16/14 1355

## 2014-09-16 NOTE — Consult Note (Signed)
Referring Physician: Dr Sloan Leiter    Chief Complaint:   HPI: Kaylee Mullins is a 79 y.o. female with a history of  hypertension, hypothyroidism, diabetes mellitus, thyroid cancer, and an acute right thalamic infarct 09/11/2014. She was discharged from [facility] on 09/13/2014 on aspirin 325 mg daily. She had presented 09/11/2014 with left hemisensory deficits and elevated blood pressure.  Today the patient awoke at 2 AM and got out of bed to go to the bathroom. She noted that she felt dizzy and her left side felt numb and tingling. She also had some mild left sided weakness and she found that her gait was unsteady. She went to the bathroom and when she got up to return to bed the feelings persisted. She went back to bed, took an aspirin, and thought about what she should do. She ended up falling asleep awakening again at approximate 6 AM. When she got out of bed she still felt dizzy with mild left hemiparesis and left hemisensory deficits. She took another aspirin and decided to come to the hospital arriving around 10 AM. An initial head CT showed no acute findings. She states she has been compliant with her daily aspirin.  Date last known well: Date: 09/15/2014 Time last known well: Unable to determine tPA Given: No: Recent stroke and late presentation.  Past Medical History  Diagnosis Date  . Hypertension   . Hypothyroidism   . Diabetes mellitus     type !  . Cancer     thyroid cancer    Past Surgical History  Procedure Laterality Date  . Thyroidectomy  2005  . Abdominal hysterectomy    . Tonsillectomy    . Lumbar spine surgery    . Flexible sigmoidoscopy  03/29/2012    Procedure: FLEXIBLE SIGMOIDOSCOPY;  Surgeon: Jeryl Columbia, MD;  Location: [facility] ENDOSCOPY;  Service: Endoscopy;  Laterality: N/A;  fleet enema upon arrival  . Hot hemostasis  03/29/2012    Procedure: HOT HEMOSTASIS (ARGON PLASMA COAGULATION/BICAP);  Surgeon: Jeryl Columbia, MD;  Location: [facility] ENDOSCOPY;  Service:  Endoscopy;  Laterality: N/A;    Family History  Problem Relation Age of Onset  . Heart failure Mother   . Heart attack Father    Social History:  reports that she has never smoked. She does not have any smokeless tobacco history on file. She reports that she does not drink alcohol or use illicit drugs.  Allergies:  Allergies  Allergen Reactions  . Nyquil Multi-Symptom [Pseudoeph-Doxylamine-Dm-Apap] Other (See Comments)    Makes pt not "feel right in her head"  . Darvon [Propoxyphene Hcl]     hallucinations    Medications:  Current Facility-Administered Medications  Medication Dose Route Frequency Provider Last Rate Last Dose  . 0.9 %  sodium chloride infusion  100 mL/hr Intravenous Continuous Carmin Muskrat, MD 100 mL/hr at 09/16/14 1301 100 mL/hr at 09/16/14 1301  . labetalol (NORMODYNE,TRANDATE) injection 20 mg  20 mg Intravenous Q10 min PRN Carmin Muskrat, MD   20 mg at 09/16/14 1525    ROS: History obtained from the patient  General ROS: negative for - chills, fatigue, fever, night sweats, weight gain or weight loss Psychological ROS: negative for - behavioral disorder, hallucinations, memory difficulties, mood swings or suicidal ideation Ophthalmic ROS: negative for - blurry vision, double vision, eye pain or loss of vision ENT ROS: negative for - epistaxis, nasal discharge, oral lesions, sore throat, tinnitus or vertigo Allergy and Immunology ROS: negative for - hives or itchy/watery  eyes Hematological and Lymphatic ROS: negative for - bleeding problems, bruising or swollen lymph nodes Endocrine ROS: negative for - galactorrhea, hair pattern changes, polydipsia/polyuria or temperature intolerance Respiratory ROS: negative for - cough, hemoptysis, shortness of breath or wheezing Cardiovascular ROS: negative for - chest pain, dyspnea on exertion, edema or irregular heartbeat Gastrointestinal ROS: negative for - abdominal pain, diarrhea, hematemesis, nausea/vomiting or  stool incontinence Genito-Urinary ROS: negative for - dysuria, hematuria, incontinence or urinary frequency/urgency Musculoskeletal ROS: Positive for low back pain, arthritis of the knees, and a left rotator cuff injury. She feels that her left flank is swollen. Neurological ROS: as noted in HPI Dermatological ROS: negative for rash and skin lesion changes    Physical Examination: Blood pressure 148/62, pulse 56, temperature 97.5 F (36.4 C), resp. rate 19, height 5\' 4"  (1.626 m), weight 104.781 kg (231 lb), SpO2 97 %.  Mental Status: Alert, oriented, thought content appropriate.  Speech fluent without evidence of aphasia.  Able to follow 3 step commands without difficulty. Cranial Nerves: II: Discs not visualized; Visual fields grossly normal, pupils equal, round, reactive to light and accommodation III,IV, VI: ptosis not present, extra-ocular motions intact bilaterally V,VII: Smile asymmetric with decreased sensation on the left side of her face to light touch. VIII: hearing normal bilaterally IX,X: gag reflex present XI: bilateral shoulder shrug XII: midline tongue extension Motor: Right : Upper extremity   5/5    Left:     Upper extremity   5/5  Lower extremity   5/5     Lower extremity   5/5 Tone and bulk:normal tone throughout; no atrophy noted Sensory: Decreased sensation to light touch of both the left upper and lower extremities. Deep Tendon Reflexes: 2+ and symmetric throughout Plantars: Right: downgoing   Left: downgoing Cerebellar: normal finger-to-nose, normal rapid alternating movements. Heel-to-shin performed with some difficulty bilaterally. Gait: normal gait and station Exts: 2-3 plus pitting edema bilaterally.  Laboratory Studies:  Basic Metabolic Panel:  Recent Labs Lab 09/11/14 1138 09/11/14 1145 09/12/14 0502 09/16/14 1110  NA 139 142 139 138  K 3.7 3.9 4.0 3.9  CL 107 105 103 105  CO2 25  --  24 24  GLUCOSE 210* 209* 203* 230*  BUN 17 22 10 22    CREATININE 0.84 0.70 0.66 0.83  CALCIUM 8.9  --  8.8 8.9    Liver Function Tests:  Recent Labs Lab 09/11/14 1138 09/16/14 1110  AST 25 25  ALT 20 21  ALKPHOS 56 57  BILITOT 0.7 0.9  PROT 7.5 7.1  ALBUMIN 3.5 3.6   No results for input(s): LIPASE, AMYLASE in the last 168 hours. No results for input(s): AMMONIA in the last 168 hours.  CBC:  Recent Labs Lab 09/11/14 1138 09/11/14 1145 09/16/14 1110  WBC 5.3  --  5.2  NEUTROABS 2.8  --  3.2  HGB 12.7 14.3 12.8  HCT 38.4 42.0 39.4  MCV 93.2  --  93.4  PLT 212  --  236    Cardiac Enzymes:  Recent Labs Lab 09/16/14 1110  TROPONINI <0.03    BNP: Invalid input(s): POCBNP  CBG:  Recent Labs Lab 09/12/14 1710 09/12/14 2047 09/13/14 0628 09/13/14 1146 09/13/14 1706  GLUCAP 157* 167* 194* 235* 122*    Microbiology: No results found for this or any previous visit.  Coagulation Studies: No results for input(s): LABPROT, INR in the last 72 hours.  Urinalysis:  Recent Labs Lab 09/11/14 1158 09/16/14 1317  COLORURINE YELLOW YELLOW  LABSPEC 1.025  1.019  PHURINE 5.0 5.5  GLUCOSEU NEGATIVE NEGATIVE  HGBUR NEGATIVE NEGATIVE  BILIRUBINUR NEGATIVE NEGATIVE  KETONESUR NEGATIVE NEGATIVE  PROTEINUR NEGATIVE NEGATIVE  UROBILINOGEN 0.2 0.2  NITRITE NEGATIVE NEGATIVE  LEUKOCYTESUR NEGATIVE NEGATIVE    Lipid Panel:    Component Value Date/Time   CHOL 149 09/13/2014 0910   TRIG 131 09/13/2014 0910   HDL 50 09/13/2014 0910   CHOLHDL 3.0 09/13/2014 0910   VLDL 26 09/13/2014 0910   LDLCALC 73 09/13/2014 0910    HgbA1C:  Lab Results  Component Value Date   HGBA1C 8.9* 09/13/2014    Urine Drug Screen:     Component Value Date/Time   LABOPIA NONE DETECTED 09/16/2014 1317   COCAINSCRNUR NONE DETECTED 09/16/2014 1317   LABBENZ NONE DETECTED 09/16/2014 1317   AMPHETMU NONE DETECTED 09/16/2014 1317   THCU NONE DETECTED 09/16/2014 1317   LABBARB NONE DETECTED 09/16/2014 1317    Alcohol Level:   Recent Labs Lab 09/11/14 1138 09/16/14 1110  ETH <5 <5    Other results: EKG: Sinus rhythm rate 64 bpm with LVH. He is referred to the formal cardiology reading for complete details.  Imaging:  MRI/MRA of the head/brain 09/11/2014 1. Acute right thalamic infarct. 2. Mild to moderate chronic small vessel ischemic disease. 3. Chronic lacunar infarcts in the deep gray nuclei and pons. 4. No major intracranial arterial occlusion. Intracranial atherosclerosis with mild narrowing of the distal left vertebral artery and minimal mid basilar narrowing. 5. 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   Ct Head Wo Contrast 09/16/2014    No acute abnormality.  Chronic microvascular ischemic change and expected evolutionary change of a small right thalamic infarct seen on comparison MRI.        Assessment: 79 y.o. female returns to [facility] following discharge 09/13/2014 with similar complaint of left hemi-paresis and left hemisensory deficits. Initial head CT was negative.  Stroke Risk Factors - diabetes mellitus, hyperlipidemia, hypertension and previous stroke  Plan:   MRI  of the brain without contrast  PT consult, OT consult, Speech consult  Prophylactic therapy-Antiplatelet med: Plavix - dose 75 mg daily.  NPO until RN stroke swallow screen  Telemetry monitoring  Frequent neuro checks   Lowry Ram [facility] Pager [phone] 09/16/2014, 5:04 PM  I have seen and evaluated the patient. I have reviewed the above note and made appropriate changes. Liekly extension of previous stroke rather than new event. In any case, could change to plavix.   Roland Rack, MD [facility] [phone]  If 7pm- 7am, please page neurology on call as listed in [place].

## 2014-09-16 NOTE — ED Notes (Signed)
Pt drove herself to the hospital to the [facility] tower area reporting that she was having a stroke. [facility] brought pt to ED. Pt reports numbness and heaviness to entire left side. Pt reports dizziness. All symptoms onset when she woke up at 0200. Pt has history of recent stroke where symptoms did not completely resolve.

## 2014-09-16 NOTE — ED Notes (Signed)
MD made aware of pt headache,

## 2014-09-16 NOTE — ED Notes (Signed)
Patient transported to CT 

## 2014-09-16 NOTE — H&P (Signed)
PATIENT DETAILS Name: Kaylee Mullins Age: 79 y.o. Sex: female Date of Birth: August 22, 1935 Admit Date: 09/16/2014 LMB:[id number] A, MD   CHIEF COMPLAINT:  Dizziness, worsening left-sided weakness with tingling and numbness since 2 AM this morning  HPI: Kaylee Mullins is a 79 y.o. female with a Past Medical History of recent CVA a few days back, diabetes, hypertension, dyslipidemia who presents today with the above noted complaint. She was discharged from this facility on 1/28, she was in her usual state of health until 2 AM this morning when she woke up to go to the bathroom. She subsequently started having unsteady gait, and dizziness. She then started having tingling and numbness on the entire left side of the body. She subsequently fell asleep, and woke up around 6 AM this morning. Upon waking up, she still was having unsteady gait, and she felt that her left lower extremity was weaker than usual. She also started having difficulty seeing out of the corner of her left eye. She subsequently came to the emergency room for further evaluation and treatment, a CT scan of the head was negative for acute abnormalities. I will subsequent asked to admit this patient for further evaluation and treatment   ALLERGIES:   Allergies  Allergen Reactions  . Nyquil Multi-Symptom [Pseudoeph-Doxylamine-Dm-Apap] Other (See Comments)    Makes pt not "feel right in her head"  . Darvon [Propoxyphene Hcl]     hallucinations    PAST MEDICAL HISTORY: Past Medical History  Diagnosis Date  . Hypertension   . Hypothyroidism   . Diabetes mellitus     type !  . Cancer     thyroid cancer    PAST SURGICAL HISTORY: Past Surgical History  Procedure Laterality Date  . Thyroidectomy  2005  . Abdominal hysterectomy    . Tonsillectomy    . Lumbar spine surgery    . Flexible sigmoidoscopy  03/29/2012    Procedure: FLEXIBLE SIGMOIDOSCOPY;  Surgeon: Jeryl Columbia, MD;  Location: [facility] ENDOSCOPY;  Service:  Endoscopy;  Laterality: N/A;  fleet enema upon arrival  . Hot hemostasis  03/29/2012    Procedure: HOT HEMOSTASIS (ARGON PLASMA COAGULATION/BICAP);  Surgeon: Jeryl Columbia, MD;  Location: [facility] ENDOSCOPY;  Service: Endoscopy;  Laterality: N/A;    MEDICATIONS AT HOME: Prior to Admission medications   Medication Sig Start Date End Date Taking? Authorizing Provider  aspirin 325 MG tablet Take 1 tablet (325 mg total) by mouth daily. 09/13/14  Yes Costin Karlyne Greenspan, MD  diltiazem (TIAZAC) 360 MG 24 hr capsule Take 360 mg by mouth daily.   Yes Historical Provider, MD  insulin regular human CONCENTRATED (HUMULIN R) 500 UNIT/ML SOLN injection Inject 100 Units into the skin 2 (two) times daily with a meal.   Yes Historical Provider, MD  levothyroxine (SYNTHROID, LEVOTHROID) 200 MCG tablet Take 200 mcg by mouth daily.   Yes Historical Provider, MD  metoprolol succinate (TOPROL-XL) 100 MG 24 hr tablet Take 200 mg by mouth daily. Take with or immediately following a meal.   Yes Historical Provider, MD  rosuvastatin (CRESTOR) 20 MG tablet Take 20 mg by mouth daily.   Yes Historical Provider, MD  telmisartan (MICARDIS) 40 MG tablet Take 40 mg by mouth daily.   Yes Historical Provider, MD    FAMILY HISTORY: Family History  Problem Relation Age of Onset  . Heart failure Mother   . Heart attack Father     SOCIAL HISTORY:  reports that  she has never smoked. She does not have any smokeless tobacco history on file. She reports that she does not drink alcohol or use illicit drugs.  REVIEW OF SYSTEMS:  Constitutional:   No  weight loss, night sweats,  Fevers, chills, fatigue.  HEENT:    No headaches, Difficulty swallowing,Tooth/dental problems,Sore throat  Cardio-vascular: No chest pain,  Orthopnea, PND, swelling in lower extremities, anasarca  GI:  No heartburn, indigestion, abdominal pain, nausea, vomiting, diarrhea, change in  bowel habits, loss of appetite  Resp: No shortness of breath with exertion  or at rest.  No excess mucus, no productive cough, No non-productive cough  Skin:  no rash or lesions.  GU:  no dysuria, change in color of urine, no urgency or frequency.  No flank pain.  Musculoskeletal: No joint pain or swelling.  No decreased range of motion.  No back pain.  Psych: No change in mood or affect. No depression or anxiety.  No memory loss.   PHYSICAL EXAM: Blood pressure 175/57, pulse 63, temperature 97.5 F (36.4 C), resp. rate 16, height 5\' 4"  (1.626 m), weight 104.781 kg (231 lb), SpO2 98 %.  General appearance :Awake, alert, not in any distress. Speech Clear. Not toxic Looking HEENT: Atraumatic and Normocephalic, pupils equally reactive to light and accomodation Neck: supple, no JVD. No cervical lymphadenopathy.  Chest:Good air entry bilaterally, no added sounds  CVS: S1 S2 regular, no murmurs.  Abdomen: Bowel sounds present, Non tender and not distended with no gaurding, rigidity or rebound. Extremities: B/L Lower Ext shows no edema, both legs are warm to touch Neurology: Awake alert, and oriented X 3, CN II-XII intact, Non focal Skin:No Rash Wounds:N/A  LABS ON ADMISSION:   Recent Labs  09/16/14 1110  NA 138  K 3.9  CL 105  CO2 24  GLUCOSE 230*  BUN 22  CREATININE 0.83  CALCIUM 8.9    Recent Labs  09/16/14 1110  AST 25  ALT 21  ALKPHOS 57  BILITOT 0.9  PROT 7.1  ALBUMIN 3.6   No results for input(s): LIPASE, AMYLASE in the last 72 hours.  Recent Labs  09/16/14 1110  WBC 5.2  NEUTROABS 3.2  HGB 12.8  HCT 39.4  MCV 93.4  PLT 236    Recent Labs  09/16/14 1110  TROPONINI <0.03   No results for input(s): DDIMER in the last 72 hours. Invalid input(s): POCBNP   RADIOLOGIC STUDIES ON ADMISSION: Ct Head Wo Contrast  09/16/2014   CLINICAL DATA:  Patient discharged 09/11/2014 history of cerebrovascular accident. Now with with numbness in the left side including the left arm and leg.  EXAM: CT HEAD WITHOUT CONTRAST  TECHNIQUE:  Contiguous axial images were obtained from the base of the skull through the vertex without intravenous contrast.  COMPARISON:  Brain MRI and head CT scans 09/11/2014.  FINDINGS: Chronic microvascular ischemic change is again identified. No evidence of acute infarction, hemorrhage, mass lesion, mass effect, midline shift or abnormal extra-axial fluid collection is identified. Hypoattenuation in the right thalamus is consistent with expected evolutionary change of the infarct seen on the prior MRI. The calvarium is intact.  IMPRESSION: No acute abnormality.  Chronic microvascular ischemic change and expected evolutionary change of a small right thalamic infarct seen on comparison MRI.   Electronically Signed   By: Inge Rise M.D.   On: 09/16/2014 12:28    EKG: Independently reviewed. NSR  ASSESSMENT AND PLAN: Present on Admission:  . Acute CVA (cerebrovascular accident): Suspect a new  CVA-she does have a visual cut deficits on the left upper and left lower field on the left eye along with mild but worsening weakness of her left lower extremity. Will admit to telemetry, check MRI brain and await neurology recommendations. For now will continue with aspirin. Since she just had a echo, carotids, A1c and lipid panel and these will not be repeated.   Marland Kitchen Uncontrolled hypertension: Allow permissive hypertension, resume metoprolol today, but will resume her usual anti-hypertensive medications tomorrow. Follow and adjust accordingly   . Dyslipidemia: Continue with statin  . Insulin-dependent diabetes: Continue with home regimen off insulin, follow CBGs.  Further plan will depend as patient's clinical course evolves and further radiologic and laboratory data become available. Patient will be monitored closely.   Above noted plan was discussed with patient/daughter-sheila*, they were in agreement.   DVT Prophylaxis: Prophylactic Lovenox  Code Status: Full Code  Disposition Plan: Home with  HHPT  Total time spent for admission equals 45 minutes.  [facility] Pager [phone]  If 7PM-7AM, please contact night-coverage [email or web address] Password [facility] 09/16/2014, 2:46 PM

## 2014-09-17 DIAGNOSIS — I63311 Cerebral infarction due to thrombosis of right middle cerebral artery: Secondary | ICD-10-CM

## 2014-09-17 DIAGNOSIS — E1159 Type 2 diabetes mellitus with other circulatory complications: Secondary | ICD-10-CM

## 2014-09-17 DIAGNOSIS — I639 Cerebral infarction, unspecified: Secondary | ICD-10-CM | POA: Insufficient documentation

## 2014-09-17 DIAGNOSIS — R200 Anesthesia of skin: Secondary | ICD-10-CM | POA: Insufficient documentation

## 2014-09-17 HISTORY — DX: Cerebral infarction, unspecified: I63.9

## 2014-09-17 LAB — GLUCOSE, CAPILLARY
Glucose-Capillary: 205 mg/dL — ABNORMAL HIGH (ref 70–99)
Glucose-Capillary: 195 mg/dL — ABNORMAL HIGH (ref 70–99)
Glucose-Capillary: 151 mg/dL — ABNORMAL HIGH (ref 70–99)
Glucose-Capillary: 204 mg/dL — ABNORMAL HIGH (ref 70–99)

## 2014-09-17 MED ORDER — GABAPENTIN 100 MG PO CAPS
100.0000 mg | ORAL_CAPSULE | Freq: Three times a day (TID) | ORAL | Status: DC
  Administered 2014-09-17 – 2014-09-18 (×3): 100 mg via ORAL
  Filled 2014-09-17 (×3): qty 1

## 2014-09-17 NOTE — Progress Notes (Signed)
Patient expressed a need for a walker for home use, will pass request on to day shift as well.

## 2014-09-17 NOTE — Progress Notes (Signed)
PROGRESS NOTE  Kaylee Mullins TKZ:[id number] DOB: 07-10-1936 DOA: 09/16/2014 PCP: Philis Fendt, MD  HPI: Kaylee Mullins is a 79 y.o. female, who presented to the ER with complaints of dizziness, worsening left-sided weakness with tingling and numbness.  She presented with these same symptoms on 09/11/14, at which time MRI was positive for right thalamic stroke.She's also had unsteady gait and difficulty seeing out of the corner of her left eye.  In the ED on 09/16/14, a CT scan of the head was negative for acute abnormalities.  MRI showed evolving acute to subacute RIGHT thalamic infarct.  Subjective: - no specific complaints today, she denies chest pain, SOB or weakness, with ongoing numbness  Assessment/Plan:  Acute CVA (cerebrovascular accident):  - Visual changes, worsening weakness, and hx of recent stroke worrisome for a new CVA - MRI brain showed evolving acute to subacute RIGHT thalamic infarct - Echo, carotids, A1c and lipid panel done last week: carotids unremarkable, Echo: no cardiac source of emboli identified (EF 50-55%), A1C 8.6, lipids wnl - Neurology consulted: prescribed Plavix, recommend frequent neuro checks - I discussed with Dr. Erlinda Hong today and will continue Plavix. She is to have surgery in the near future and she will probably need to come off of Plavix and be on aspirin perioperatively. This will be coordinated later time. Discussed with patient and well.    Uncontrolled hypertension:  - Allowing permissive hypertension - Resumed metoprolol 09/17/14 - Labetalol PRN ordered - BP 161/61 today - Continue monitoring  Dyslipidemia: Continue with statin   Insulin-dependent diabetes: Continue with home regimen of insulin, follow CBGs.   DVT Prophylaxis:  Lovenox  Renal mass - she is to have outpatient follow up, unclear timing of surgery right now.   Code Status: Full Family Communication: No family at bedside at time of visit Disposition Plan: Remain  inpatient  Consultants:  Neurology  Procedures:  Echo and Carotid Doppler performed last admission  No procedures at this admission  Antibiotics: None  Objective: Filed Vitals:   09/17/14 0300 09/17/14 0500 09/17/14 1016 09/17/14 1341  BP: 169/58 167/66 178/76 161/61  Pulse: 67 67 65 57  Temp: 97.9 F (36.6 C) 98.4 F (36.9 C) 98.3 F (36.8 C) 98.2 F (36.8 C)  TempSrc: Oral Oral Oral Oral  Resp: 17 17 19 20   Height:      Weight:      SpO2: 99% 97% 98% 99%   No intake or output data in the 24 hours ending 09/17/14 1417 Filed Weights   09/16/14 1050  Weight: 104.781 kg (231 lb)   Exam: General: Well developed, well nourished, NAD, appears stated age 67:  Anicteic Sclera, MMM.  Neck: Supple, no JVD, no masses  Cardiovascular: RRR, S1 S2 auscultated, no rubs, murmurs or gallops.   Respiratory: Clear to auscultation bilaterally with equal chest rise  Abdomen: Soft, nontender, nondistended, + bowel sounds  Extremities: warm dry without cyanosis clubbing or edema.  Neuro: AAOx3 Skin: Without rashes exudates or nodules.   Psych: Normal affect and demeanor with intact judgement and insight  Data Reviewed: Basic Metabolic Panel:  Recent Labs Lab 09/11/14 1138 09/11/14 1145 09/12/14 0502 09/16/14 1110  NA 139 142 139 138  K 3.7 3.9 4.0 3.9  CL 107 105 103 105  CO2 25  --  24 24  GLUCOSE 210* 209* 203* 230*  BUN 17 22 10 22   CREATININE 0.84 0.70 0.66 0.83  CALCIUM 8.9  --  8.8 8.9   Liver Function Tests:  Recent Labs Lab 09/11/14 1138 09/16/14 1110  AST 25 25  ALT 20 21  ALKPHOS 56 57  BILITOT 0.7 0.9  PROT 7.5 7.1  ALBUMIN 3.5 3.6   CBC:  Recent Labs Lab 09/11/14 1138 09/11/14 1145 09/16/14 1110  WBC 5.3  --  5.2  NEUTROABS 2.8  --  3.2  HGB 12.7 14.3 12.8  HCT 38.4 42.0 39.4  MCV 93.2  --  93.4  PLT 212  --  236   Cardiac Enzymes:  Recent Labs Lab 09/16/14 1110  TROPONINI <0.03   CBG:  Recent Labs Lab 09/13/14 1706  09/16/14 1718 09/16/14 2228 09/17/14 0724 09/17/14 1139  GLUCAP 122* 162* 238* 205* 195*    Studies: Ct Head Wo Contrast  09/16/2014   CLINICAL DATA:  Patient discharged 09/11/2014 history of cerebrovascular accident. Now with with numbness in the left side including the left arm and leg.  EXAM: CT HEAD WITHOUT CONTRAST  TECHNIQUE: Contiguous axial images were obtained from the base of the skull through the vertex without intravenous contrast.  COMPARISON:  Brain MRI and head CT scans 09/11/2014.  FINDINGS: Chronic microvascular ischemic change is again identified. No evidence of acute infarction, hemorrhage, mass lesion, mass effect, midline shift or abnormal extra-axial fluid collection is identified. Hypoattenuation in the right thalamus is consistent with expected evolutionary change of the infarct seen on the prior MRI. The calvarium is intact.  IMPRESSION: No acute abnormality.  Chronic microvascular ischemic change and expected evolutionary change of a small right thalamic infarct seen on comparison MRI.   Electronically Signed   By: Inge Rise M.D.   On: 09/16/2014 12:28   Mr Jodene Nam Head Wo Contrast  09/17/2014   CLINICAL DATA:  Worsening LEFT-sided weakness with tingling and numbness beginning at 2 a.m. Unsteady gait, LEFT lower extremity weakness. History of stroke within the last week. History of diabetes, hypertension and dyslipidemia.  EXAM: MRI HEAD WITHOUT CONTRAST  MRA HEAD WITHOUT CONTRAST  TECHNIQUE: Multiplanar, multiecho pulse sequences of the brain and surrounding structures were obtained without intravenous contrast. Angiographic images of the head were obtained using MRA technique without contrast.  COMPARISON:  CT of the head September 16, 2014 at 12:11 p.m. and MRI an MRA of the brain September 11, 2014  FINDINGS: MRI HEAD FINDINGS  Mildly motion degraded examination.  Subcentimeter focus of reduced diffusion in the LEFT occipital lobe, considering peripheral location favored to  represent artifact. Patient's known RIGHT thalamus small infarct better seen on today's 3 tesla scanner. Residual decreased ADC values. Stable tiny microhemorrhage in LEFT mesial thalamus. No midline shift or mass effect.  Ventricles and sulci are normal for patient's age. Patchy to confluent supratentorial white matter FLAIR T2 hyperintensities, unchanged. Remote small pontine infarct tiny T2 hyperintensities in the basal ganglia favor perivascular spaces.  No abnormal extra-axial fluid collections. Ocular globes and orbital contents are unremarkable. No paranasal sinus air-fluid levels. Mastoid air cells appear well-aerated. No abnormal sellar expansion. No cerebellar tonsillar ectopia. Patient is edentulous.  MRA HEAD FINDINGS  Anterior circulation: Normal flow related enhancement of the included cervical, petrous, cavernous and supra clinoid internal carotid arteries. Slightly lobulated contour of the anterior genu of the RIGHT internal carotid artery, which may in part may reflect ophthalmic artery origin infundibulum. Patent anterior communicating artery. Normal flow related enhancement of the anterior and middle cerebral arteries, including more distal segments.  No large vessel occlusion, high-grade stenosis, abnormal luminal irregularity.  Posterior circulation: RIGHT vertebral artery is dominant. Basilar artery  is patent, with normal flow related enhancement of the main branch vessels. Normal flow related enhancement of the posterior cerebral arteries. Robust LEFT posterior communicating artery present.  No large vessel occlusion, high-grade stenosis, abnormal luminal irregularity, aneurysm.  IMPRESSION: MRI HEAD: Evolving acute to subacute RIGHT thalamic infarct, no hemorrhagic conversion. Tiny focus of likely spurious reduced diffusion LEFT occipital lobe.  Moderate white matter changes suggest chronic small vessel ischemic disease. Remote pontine small infarct.  MRA HEAD: No high-grade stenosis or acute  vascular process.  No convincing evidence of para ophthalmic aneurysm though, this is equivocal on two consecutive MRA examinations, CT angiogram of the head may provide further delineation, on a nonemergent basis.   Electronically Signed   By: Elon Alas   On: 09/17/2014 00:46   Mr Brain Wo Contrast  09/17/2014   CLINICAL DATA:  Worsening LEFT-sided weakness with tingling and numbness beginning at 2 a.m. Unsteady gait, LEFT lower extremity weakness. History of stroke within the last week. History of diabetes, hypertension and dyslipidemia.  EXAM: MRI HEAD WITHOUT CONTRAST  MRA HEAD WITHOUT CONTRAST  TECHNIQUE: Multiplanar, multiecho pulse sequences of the brain and surrounding structures were obtained without intravenous contrast. Angiographic images of the head were obtained using MRA technique without contrast.  COMPARISON:  CT of the head September 16, 2014 at 12:11 p.m. and MRI an MRA of the brain September 11, 2014  FINDINGS: MRI HEAD FINDINGS  Mildly motion degraded examination.  Subcentimeter focus of reduced diffusion in the LEFT occipital lobe, considering peripheral location favored to represent artifact. Patient's known RIGHT thalamus small infarct better seen on today's 3 tesla scanner. Residual decreased ADC values. Stable tiny microhemorrhage in LEFT mesial thalamus. No midline shift or mass effect.  Ventricles and sulci are normal for patient's age. Patchy to confluent supratentorial white matter FLAIR T2 hyperintensities, unchanged. Remote small pontine infarct tiny T2 hyperintensities in the basal ganglia favor perivascular spaces.  No abnormal extra-axial fluid collections. Ocular globes and orbital contents are unremarkable. No paranasal sinus air-fluid levels. Mastoid air cells appear well-aerated. No abnormal sellar expansion. No cerebellar tonsillar ectopia. Patient is edentulous.  MRA HEAD FINDINGS  Anterior circulation: Normal flow related enhancement of the included cervical, petrous,  cavernous and supra clinoid internal carotid arteries. Slightly lobulated contour of the anterior genu of the RIGHT internal carotid artery, which may in part may reflect ophthalmic artery origin infundibulum. Patent anterior communicating artery. Normal flow related enhancement of the anterior and middle cerebral arteries, including more distal segments.  No large vessel occlusion, high-grade stenosis, abnormal luminal irregularity.  Posterior circulation: RIGHT vertebral artery is dominant. Basilar artery is patent, with normal flow related enhancement of the main branch vessels. Normal flow related enhancement of the posterior cerebral arteries. Robust LEFT posterior communicating artery present.  No large vessel occlusion, high-grade stenosis, abnormal luminal irregularity, aneurysm.  IMPRESSION: MRI HEAD: Evolving acute to subacute RIGHT thalamic infarct, no hemorrhagic conversion. Tiny focus of likely spurious reduced diffusion LEFT occipital lobe.  Moderate white matter changes suggest chronic small vessel ischemic disease. Remote pontine small infarct.  MRA HEAD: No high-grade stenosis or acute vascular process.  No convincing evidence of para ophthalmic aneurysm though, this is equivocal on two consecutive MRA examinations, CT angiogram of the head may provide further delineation, on a nonemergent basis.   Electronically Signed   By: Elon Alas   On: 09/17/2014 00:46    Scheduled Meds: . aspirin  325 mg Oral Daily  . clopidogrel  75 mg  Oral Daily  . diltiazem  360 mg Oral Daily  . enoxaparin (LOVENOX) injection  40 mg Subcutaneous Q24H  . gabapentin  100 mg Oral TID  . insulin aspart  0-20 Units Subcutaneous TID WC  . insulin glargine  20 Units Subcutaneous BID  . irbesartan  150 mg Oral Daily  . levothyroxine  200 mcg Oral Daily  . metoprolol succinate  200 mg Oral Daily  . rosuvastatin  20 mg Oral Daily   Continuous Infusions:   Principal Problem:   Acute CVA (cerebrovascular  accident) Active Problems:   Diabetes mellitus   Dyslipidemia   Uncontrolled hypertension  Caroline E. Howell-Methvin, PA-S  Taffie Eckmann M. Cruzita Lederer, MD [facility] [phone]   [facility] If 7PM-7AM, please contact night-coverage at [email or web address], password [facility] 09/17/2014, 2:17 PM  LOS: 1 day

## 2014-09-17 NOTE — Evaluation (Signed)
Physical Therapy Evaluation Patient Details Name: Kaylee Mullins MRN: [id number] DOB: 05-02-1936 Today's Date: 09/17/2014   History of Present Illness  Patient is a 79 y/o female with PMH of HTN, hypothyroidism, DM, thyroid cancer, and an acute right thalamic infarct 09/11/2014. She was d/ced from [facility] on 09/13/2014 on aspirin 325 mg daily.  Pt got up to go to the bathroom and felt dizzy and her left side felt numb and tingling. She also had some mild left sided weakness and she found that her gait was unsteady.  MRI head- Evolving acute to subacute RIGHT thalamic infarct.    Clinical Impression  Patient presents with functional limitations due to deficits listed in PT problem list (see below). Pt with impaired strength/sensation/coordination deficits LUE/LE impacting safe mobility. Pt with ataxic gait pattern requiring need for RW. Very active PTA. Education provided on signs and symptoms of CVA. Pt would benefit from skilled PT to improve balance, gait and mobility so pt can maximize independence and return to PLOF.    Follow Up Recommendations Outpatient PT;Supervision - Intermittent Neuro OPPT    Equipment Recommendations  Rolling walker with 5" wheels    Recommendations for Other Services OT consult     Precautions / Restrictions Precautions Precautions: Fall Restrictions Weight Bearing Restrictions: No      Mobility  Bed Mobility               General bed mobility comments: Received sitting EOB upon PT arrival.   Transfers Overall transfer level: Needs assistance Equipment used: Rolling walker (2 wheeled) Transfers: Sit to/from Stand Sit to Stand: Min guard         General transfer comment: Min guard for safety.   Ambulation/Gait Ambulation/Gait assistance: Min guard Ambulation Distance (Feet): 150 Feet Assistive device: Rolling walker (2 wheeled) Gait Pattern/deviations: Step-through pattern;Decreased stride length;Ataxic;Wide base of support     General  Gait Details: Ataxia present LLE -downward gaze to watch for left foot placement. Cues for RW proximity and safety as pt with difficulty with turns. 1 standing rest break due to fatigue. Vitals stable.  Stairs            Wheelchair Mobility    Modified Rankin (Stroke Patients Only) Modified Rankin (Stroke Patients Only) Pre-Morbid Rankin Score: No significant disability Modified Rankin: Moderately severe disability     Balance Overall balance assessment: Needs assistance Sitting-balance support: Feet supported;No upper extremity supported Sitting balance-Leahy Scale: Good Sitting balance - Comments: Able to bring LEs up onto bed (long sitting position) to donn socks and reach outside BoS without difficulty.    Standing balance support: During functional activity Standing balance-Leahy Scale: Fair                               Pertinent Vitals/Pain Pain Assessment: No/denies pain    Home Living Family/patient expects to be discharged to:: Private residence Living Arrangements: Alone Available Help at Discharge: Family;Available PRN/intermittently Type of Home: House Home Access: Stairs to enter Entrance Stairs-Rails: Right Entrance Stairs-Number of Steps: 12 Home Layout: Two level Home Equipment: Cane - single point      Prior Function Level of Independence: Independent         Comments: Pt reports she drives and is active in the community      Hand Dominance   Dominant Hand: Right    Extremity/Trunk Assessment   Upper Extremity Assessment: Defer to OT evaluation  LUE Deficits / Details: Mild dysmetria noted during FTN   Lower Extremity Assessment: LLE deficits/detail;Generalized weakness   LLE Deficits / Details: Grossly ~3+/5 throughout hip, knee, ankle. Impaired coordination and dysdiadochokinesia noted; heel to shin impaired compared to right side.   Cervical / Trunk Assessment: Normal  Communication   Communication: No  difficulties  Cognition Arousal/Alertness: Awake/alert Behavior During Therapy: WFL for tasks assessed/performed Overall Cognitive Status: Within Functional Limits for tasks assessed                      General Comments      Exercises        Assessment/Plan    PT Assessment Patient needs continued PT services  PT Diagnosis Abnormality of gait;Difficulty walking;Generalized weakness   PT Problem List Decreased strength;Decreased coordination;Impaired sensation;Decreased knowledge of use of DME;Decreased safety awareness;Decreased balance;Decreased mobility  PT Treatment Interventions Balance training;Gait training;Stair training;Neuromuscular re-education;DME instruction;Patient/family education;Functional mobility training;Therapeutic activities;Therapeutic exercise   PT Goals (Current goals can be found in the Care Plan section) Acute Rehab PT Goals Patient Stated Goal: to return to independence. PT Goal Formulation: With patient Time For Goal Achievement: 10/01/14 Potential to Achieve Goals: Fair    Frequency Min 4X/week   Barriers to discharge Decreased caregiver support;Inaccessible home environment      Co-evaluation               End of Session Equipment Utilized During Treatment: Gait belt Activity Tolerance: Patient tolerated treatment well Patient left: in bed;with call bell/phone within reach (sitting EOB.) Nurse Communication: Mobility status         Time: 1130-1153 PT Time Calculation (min) (ACUTE ONLY): 23 min   Charges:   PT Evaluation $Initial PT Evaluation Tier I: 1 Procedure PT Treatments $Gait Training: 8-22 mins   PT G CodesNeal Dy, Arlenis Blaydes A 05-Oct-2014, 1:10 PM Candy Sledge, [place], DPT [phone]

## 2014-09-17 NOTE — Evaluation (Signed)
Speech Language Pathology Evaluation Patient Details Name: MIKEL PYON MRN: [id number] DOB: 16-Dec-1935 Today's Date: 09/17/2014 Time: 5462-7035 SLP Time Calculation (min) (ACUTE ONLY): 20 min  Problem List:  Patient Active Problem List   Diagnosis Date Noted  . Acute CVA (cerebrovascular accident) 09/16/2014  . Right renal mass 09/13/2014  . Cerebral infarction due to unspecified mechanism   . CVA (cerebral infarction) 09/11/2014  . Osteoarthritis 09/11/2014  . Postsurgical hypothyroidism 09/11/2014  . Uncontrolled hypertension 09/11/2014  . CVA (cerebral vascular accident) 09/11/2014  . Diabetes mellitus 04/20/2007  . Dyslipidemia 04/20/2007   Past Medical History:  Past Medical History  Diagnosis Date  . Hypertension   . Hypothyroidism   . Diabetes mellitus     type !  . Cancer     thyroid cancer   Past Surgical History:  Past Surgical History  Procedure Laterality Date  . Thyroidectomy  2005  . Abdominal hysterectomy    . Tonsillectomy    . Lumbar spine surgery    . Flexible sigmoidoscopy  03/29/2012    Procedure: FLEXIBLE SIGMOIDOSCOPY;  Surgeon: Jeryl Columbia, MD;  Location: [facility] ENDOSCOPY;  Service: Endoscopy;  Laterality: N/A;  fleet enema upon arrival  . Hot hemostasis  03/29/2012    Procedure: HOT HEMOSTASIS (ARGON PLASMA COAGULATION/BICAP);  Surgeon: Jeryl Columbia, MD;  Location: [facility] ENDOSCOPY;  Service: Endoscopy;  Laterality: N/A;   HPI:  79 y.o. female with a Past Medical History of recent CVA (several days agos), diabetes, hypertension, dyslipidemia admitted with dizziness, worsening left sided weakness and numbness. MRI revealed evolving acute to subacute RIGHT thalamic infarct, no hemorrhagic conversion. Tiny focus of likely spurious reduced diffusion LEFT occipital lobe.   Assessment / Plan / Recommendation Clinical Impression  Cognitive abilities are WFL's. She scored in normal range on memory subtest of Cognistat. Speech and language are functional. She  uses a pill box at home, calendar and sticky notes for prospective memory and daughter calls her daily. No ST needed at this time.    SLP Assessment  Patient does not need any further Speech Lanaguage Pathology Services    Follow Up Recommendations  None    Frequency and Duration        Pertinent Vitals/Pain Pain Assessment: No/denies pain       SLP Evaluation Prior Functioning  Cognitive/Linguistic Baseline: Within functional limits Type of Home: House  Lives With: Alone Vocation: Retired   Associate Professor  Overall Cognitive Status: Within Functional Limits for tasks assessed Arousal/Alertness: Awake/alert Orientation Level: Oriented X4 Attention: Sustained Sustained Attention: Appears intact Memory: Appears intact (scored WFL's on Cognistat subtest) Awareness: Appears intact Problem Solving: Appears intact Safety/Judgment: Appears intact    Comprehension  Auditory Comprehension Overall Auditory Comprehension: Appears within functional limits for tasks assessed Visual Recognition/Discrimination Discrimination: Not tested Reading Comprehension Reading Status: Within funtional limits    Expression Expression Primary Mode of Expression: Verbal Verbal Expression Overall Verbal Expression: Appears within functional limits for tasks assessed Written Expression Dominant Hand: Right Written Expression: Not tested   Oral / Motor Oral Motor/Sensory Function Overall Oral Motor/Sensory Function: Impaired Labial ROM: Reduced right (although primary infart right, small left lobe involvement) Labial Symmetry: Abnormal symmetry right (although primary infart right, small left lobe involvement) Labial Strength: Within Functional Limits Labial Sensation: Reduced (pt stated decreased on left) Lingual ROM: Within Functional Limits Lingual Symmetry: Within Functional Limits Lingual Strength: Within Functional Limits Facial ROM: Within Functional Limits Facial Symmetry: Within  Functional Limits Facial Strength: Within Functional Limits Mandible: Within  Functional Limits Motor Speech Overall Motor Speech: Appears within functional limits for tasks assessed Intelligibility: Intelligible   GO     Houston Siren 09/17/2014, 8:30 AM   Orbie Pyo Colvin Caroli.Ed [employer] [phone]

## 2014-09-17 NOTE — Progress Notes (Signed)
STROKE TEAM PROGRESS NOTE   HISTORY Kaylee Mullins is a 79 y.o. female with a history of hypertension, hypothyroidism, diabetes mellitus, thyroid cancer, and an acute right thalamic infarct 09/11/2014. She was discharged from [facility] on 09/13/2014 on aspirin 325 mg daily. She had presented 09/11/2014 with left hemisensory deficits and elevated blood pressure.  Today 09/16/2014  the patient awoke at 2 AM and got out of bed to go to the bathroom. She noted that she felt dizzy and her left side felt numb and tingling. She also had some mild left sided weakness and she found that her gait was unsteady. She went to the bathroom and when she got up to return to bed the feelings persisted. She went back to bed, took an aspirin, and thought about what she should do. She ended up falling asleep awakening again at approximate 6 AM. When she got out of bed she still felt dizzy with mild left hemiparesis and left hemisensory deficits. She took another aspirin and decided to come to the hospital arriving around 10 AM. An initial head CT showed no acute findings. She states she has been compliant with her daily aspirin.  She was last known well 09/15/2014 at bedtime, unable to determine time. Patient was not administered TPA secondary to  Recent stroke and late presentation. She was admitted for further evaluation and treatment.   SUBJECTIVE (INTERVAL HISTORY) No family is at the bedside. She recounted her presentation with Dr. Erlinda Hong. Overall she feels her condition is unchanged. She still has numbness on the left side.pt is helping her daughter take care of 2 young foster kids, who at the age of 46 & 65 wanted to go back to their mother. They have been going back and forth since school got out 2015. She is constantly doing stuff for then and her son who does not have a car. She reports a lot of stress.   OBJECTIVE Temp:  [97.9 F (36.6 C)-98.4 F (36.9 C)] 98.3 F (36.8 C) (02/01 1016) Pulse Rate:   [55-71] 65 (02/01 1016) Cardiac Rhythm:  [-]  Resp:  [14-21] 19 (02/01 1016) BP: (146-222)/(54-139) 178/76 mmHg (02/01 1016) SpO2:  [95 %-100 %] 98 % (02/01 1016)   Recent Labs Lab 09/13/14 1706 09/16/14 1718 09/16/14 2228 09/17/14 0724 09/17/14 1139  GLUCAP 122* 162* 238* 205* 195*    Recent Labs Lab 09/11/14 1138 09/11/14 1145 09/12/14 0502 09/16/14 1110  NA 139 142 139 138  K 3.7 3.9 4.0 3.9  CL 107 105 103 105  CO2 25  --  24 24  GLUCOSE 210* 209* 203* 230*  BUN 17 22 10 22   CREATININE 0.84 0.70 0.66 0.83  CALCIUM 8.9  --  8.8 8.9    Recent Labs Lab 09/11/14 1138 09/16/14 1110  AST 25 25  ALT 20 21  ALKPHOS 56 57  BILITOT 0.7 0.9  PROT 7.5 7.1  ALBUMIN 3.5 3.6    Recent Labs Lab 09/11/14 1138 09/11/14 1145 09/16/14 1110  WBC 5.3  --  5.2  NEUTROABS 2.8  --  3.2  HGB 12.7 14.3 12.8  HCT 38.4 42.0 39.4  MCV 93.2  --  93.4  PLT 212  --  236    Recent Labs Lab 09/16/14 1110  TROPONINI <0.03   No results for input(s): LABPROT, INR in the last 72 hours.  Recent Labs  09/16/14 [place] 1.019  PHURINE 5.5  [place] NEGATIVE  BILIRUBINUR  NEGATIVE  KETONESUR NEGATIVE  PROTEINUR NEGATIVE  UROBILINOGEN 0.2  NITRITE NEGATIVE  LEUKOCYTESUR NEGATIVE       Component Value Date/Time   CHOL 149 09/13/2014 0910   TRIG 131 09/13/2014 0910   HDL 50 09/13/2014 0910   CHOLHDL 3.0 09/13/2014 0910   VLDL 26 09/13/2014 0910   LDLCALC 73 09/13/2014 0910   Lab Results  Component Value Date   HGBA1C 8.9* 09/13/2014      Component Value Date/Time   LABOPIA NONE DETECTED 09/16/2014 1317   COCAINSCRNUR NONE DETECTED 09/16/2014 1317   LABBENZ NONE DETECTED 09/16/2014 1317   AMPHETMU NONE DETECTED 09/16/2014 1317   THCU NONE DETECTED 09/16/2014 1317   LABBARB NONE DETECTED 09/16/2014 1317     Recent Labs Lab 09/11/14 1138 09/16/14 1110  ETH <5 <5    Ct Head Wo Contrast 09/16/2014     IMPRESSION: No  acute abnormality.  Chronic microvascular ischemic change and expected evolutionary change of a small right thalamic infarct seen on comparison MRI.     Mr Jodene Nam Head Wo Contrast 09/17/2014      IMPRESSION:  No high-grade stenosis or acute vascular process.  No convincing evidence of para ophthalmic aneurysm though, this is equivocal on two consecutive MRA examinations, CT angiogram of the head may provide further delineation, on a nonemergent basis.     Mr Brain Wo Contrast 09/17/2014     IMPRESSION: Evolving acute to subacute RIGHT thalamic infarct, no hemorrhagic conversion. Tiny focus of likely spurious reduced diffusion LEFT occipital lobe.  Moderate white matter changes suggest chronic small vessel ischemic disease. Remote pontine small infarct.    Carotid Doppler 09/12/2014 There is 1-39% bilateral ICA stenosis. Vertebral artery flow is antegrade.   2D Echocardiogram  09/13/2014 EF 50-55% with no source of embolus. No cardiac source of emboli was indentified.   PHYSICAL EXAM Physical exam  Temp:  [97.9 F (36.6 C)-98.4 F (36.9 C)] 98.2 F (36.8 C) (02/01 2130) Pulse Rate:  [56-71] 56 (02/01 2130) Resp:  [16-20] 16 (02/01 2130) BP: (161-182)/(56-76) 182/72 mmHg (02/01 2130) SpO2:  [97 %-99 %] 97 % (02/01 2130)  General - Well nourished, well developed, in no apparent distress.  Ophthalmologic - Sharp disc margins OU.  Cardiovascular - Regular rate and rhythm with no murmur.  Mental Status -  Level of arousal and orientation to time, place, and person were intact. Language including expression, naming, repetition, comprehension was assessed and found intact.  Cranial Nerves II - XII - II - Visual field intact OU. III, IV, VI - Extraocular movements intact. V - Facial sensation intact bilaterally. VII - Facial movement intact bilaterally. VIII - Hearing & vestibular intact bilaterally. X - Palate elevates symmetrically. XI - Chin turning & shoulder shrug intact  bilaterally. XII - Tongue protrusion intact.  Motor Strength - The patient's strength was normal in all extremities and pronator drift was absent.  Bulk was normal and fasciculations were absent.   Motor Tone - Muscle tone was assessed at the neck and appendages and was normal.  Reflexes - The patient's reflexes were normal in all extremities and she had no pathological reflexes.  Sensory - Light touch, temperature/pinprick was decreased on the left.    Coordination - The patient had normal movements in the hands and feet with no ataxia or dysmetria.  Tremor was absent.  Gait and Station - The patient's transfers, posture, gait, station, and turns were observed as normal.  ASSESSMENT/PLAN [name] is a 79  y.o. female with history of hypothyroidism, diabetes mellitus, thyroid cancer, and an acute right thalamic infarct 09/11/2014 presenting with worsening of left sided hemisensory deificit. She did not receive IV t-PA due to recent stroke, delay in arrival.   Recrudescence of prior R thalamic infarct. no new Stroke  Resultant  Left hemisensory deficits  MRI  Recent R thalamic stroke. No new stroke  MRA  No significant stenosis  Carotid Doppler  No significant stenosis   2D Echo  No source of embolus   Lovenox 40 mg sq daily for VTE prophylaxis  Diet Heart thin liquids  aspirin 325 mg orally every day prior to admission, now on clopidogrel 75 mg orally every day. Continue plavix until renal biopsy. Change to ASA one week before biopsy and resume after biopsy.   Gabapentin 100 mg tid to help with left hemisensory deficit  Therapy recommendations:  OP PT, RW w/ 5" wheels  Disposition:  Return home  Hypertension  Elevated, as high as 222/76  Continue home medications  Hyperlipidemia  Home meds:  crestor  LDL 73, goal < 70  Continue statin at discharge  Diabetes  HgbA1c 8.9, goal < 7.0  Uncontrolled  Close follow up with PCP  Aggressive  treatment  Other Stroke Risk Factors  Advanced age  Obesity, Body mass index is 39.63 kg/(m^2).   Family hx stroke (sister)  Hx thyroid cancer, in remission  Recent dx of renal mass, for removal soon  Other Active Problems  Increased stress  NO FURTHER STROKE WORKUP INDICATED  Patient has a 10-15% risk of having another stroke over the next year, the highest risk is within 2 weeks of the most recent stroke/TIA (risk of having a stroke following a stroke or TIA is the same).  Ongoing risk factor control by Primary Care Physician  Stroke Service will sign off. Please call should any needs arise.  Follow up with Dr. Antony Contras, stroke clinic at [facility] - appt already scheduled in March.     Hospital day # [place] for Pager information 09/17/2014 2:41 PM   I, the attending vascular neurologist, have personally obtained a history, examined the patient, evaluated laboratory data, individually viewed imaging studies and agree with radiology interpretations. I also discussed with Dr. Letta Median regarding his care plan. Together with the NP/PA, we formulated the assessment and plan of care which reflects our mutual decision.  I have made any additions or clarifications directly to the above note and agree with the findings and plan as currently documented.   79 yo F with PMH of DM, HTN, HLD, recent right thalamic stroke, new diagnosed renal mass and remote thyroid cancer in remission admitted again for left sided numbness. MRI no new stroke. Her condition most likely stroke recrudescence due to stress. We recommend switch from ASA to plavix and continue statin. Risk factor modification. Renal biopsy as scheduled and switch to ASA one week before biopsy. Follow up with Dr. Leonie Man in clinic.   Neurology will sign off. Please call with questions. Pt will follow up with Dr. Leonie Man at [facility] in March 2016. Thanks for the  consult.   Rosalin Hawking, MD PhD Stroke Neurology 09/17/2014 11:09 PM       To contact Stroke Continuity provider, please refer to [email or web address] After hours, contact General Neurology

## 2014-09-17 NOTE — Progress Notes (Signed)
CARE MANAGEMENT NOTE 09/17/2014  Patient:  Kaylee Mullins, Kaylee Mullins   Account Number:  [id number]  Date Initiated:  09/17/2014  Documentation initiated by:  Olga Coaster  Subjective/Objective Assessment:   ADMITTED WITH STROKE     Action/Plan:   CM FOLLOWING FOR DCP   Anticipated DC Date:  09/21/2014   Anticipated DC Plan:  AWAITING FOR PT/OT EVALS FOR DISPOSITION NEEDS     DC Planning Services  CM consult         Status of service:  In process, will continue to follow Medicare Important Message given?   (If response is "NO", the following Medicare IM given date fields will be blank)  Per UR Regulation:  Reviewed for med. necessity/level of care/duration of stay  Comments:  2/1/2016Mindi Slicker RN,BSN,MHA [phone]

## 2014-09-18 LAB — GLUCOSE, CAPILLARY
Glucose-Capillary: 177 mg/dL — ABNORMAL HIGH (ref 70–99)
Glucose-Capillary: 182 mg/dL — ABNORMAL HIGH (ref 70–99)

## 2014-09-18 MED ORDER — TELMISARTAN 40 MG PO TABS: 80.0000 mg | ORAL_TABLET | Freq: Every day | ORAL | Status: DC

## 2014-09-18 MED ORDER — UNABLE TO FIND: Status: DC

## 2014-09-18 MED ORDER — CLOPIDOGREL BISULFATE 75 MG PO TABS: 75.0000 mg | ORAL_TABLET | Freq: Every day | ORAL | Status: DC

## 2014-09-18 MED ORDER — DILTIAZEM HCL ER COATED BEADS 180 MG PO CP24
360.0000 mg | ORAL_CAPSULE | Freq: Every day | ORAL | Status: DC
  Administered 2014-09-18: 360 mg via ORAL

## 2014-09-18 MED ORDER — IRBESARTAN 300 MG PO TABS
300.0000 mg | ORAL_TABLET | Freq: Every day | ORAL | Status: DC
  Administered 2014-09-18: 300 mg via ORAL
  Filled 2014-09-18: qty 1

## 2014-09-18 NOTE — Progress Notes (Signed)
Talked to patient about DCP; patient's daughter will stay with her after discharge to assist with her care; Patient wants [facility] and not Outpatient therapy at this time; Patient chose [facility] for [facility] services; Miranda with [facility] called for arrangements/ DME ordered; Aneta Mins [phone]

## 2014-09-18 NOTE — Discharge Summary (Signed)
Physician Discharge Summary  [name] ZJI:[id number] DOB: 1936/03/22 DOA: 09/16/2014  PCP: Philis Fendt, MD  Admit date: 09/16/2014 Discharge date: 09/18/2014   Time spent: 35 minutes  Recommendations for Outpatient Follow-up:  1. Follow up with Dr. Jackson Latino in 1-2 weeks 2. Follow up with Dr. Diona Fanti in 3-4 weeks 3. Follow up with Dr. Leonie Man in 1-2 months   Recommendations for primary care physician for things to follow:  Monitor BP  Discharge Diagnoses:  Principal Problem:   Acute CVA (cerebrovascular accident) Active Problems:   Diabetes mellitus   Dyslipidemia   Uncontrolled hypertension   Numbness  Discharge Condition: stable  Diet recommendation: heart healthy  Filed Weights   09/16/14 1050  Weight: 104.781 kg (231 lb)   History of present illness:  Kaylee Mullins is a 79 y.o. female with a Past Medical History of recent CVA a few days back, diabetes, hypertension, dyslipidemia who presents today with Dizziness, worsening left-sided weakness with tingling and numbness since 2 AM this morning. She was discharged from this facility on 1/28, she was in her usual state of health until 2 AM this morning when she woke up to go to the bathroom. She subsequently started having unsteady gait, and dizziness. She then started having tingling and numbness on the entire left side of the body. She subsequently fell asleep, and woke up around 6 AM this morning. Upon waking up, she still was having unsteady gait, and she felt that her left lower extremity was weaker than usual. She also started having difficulty seeing out of the corner of her left eye. She subsequently came to the emergency room for further evaluation and treatment, a CT scan of the head was negative for acute abnormalities. I will subsequent asked to admit this patient for further evaluation and treatment  Hospital Course:  Acute CVA (cerebrovascular accident):  - MRI brain showed evolving acute to subacute  RIGHT thalamic infarct - Echo, carotids, A1c and lipid panel done last week: carotids unremarkable, Echo: no cardiac source of emboli identified (EF 50-55%), A1C 8.6, lipids wnl  - Neurology consulted: prescribed Plavix, recommend frequent neuro checks - I discussed with Dr. Erlinda Hong and will continue Plavix. She is to have surgery in the near future and she will probably need to come off of Plavix and be on aspirin perioperatively. This will be coordinated at a later time with her urologist. Discussed with patient and well.  Uncontrolled hypertension: - patient with SBP 180s-190s, allowing for permissive hypertension. She has received increased ARB (equivalent to 80 mg Micardis) with SBP 150-160s. She has been having persistent elevated blood pressures 180-200s at home for quite some time, will increase her Micardis to 80 as above.  Dyslipidemia: Continue with statin  Insulin-dependent diabetes: Continue with home regimen of insulin, follow CBGs.  DVT Prophylaxis: Lovenox Renal mass - she is to have outpatient follow up, unclear timing of surgery right now.   Procedures:  None    Consultations:  Neurology   Discharge Exam: Filed Vitals:   09/18/14 1000 09/18/14 1102 09/18/14 1215 09/18/14 1312  BP: 190/85  154/65 178/68  Pulse:  63  65  Temp:    97.7 F (36.5 C)  TempSrc:    Oral  Resp:    20  Height:      Weight:      SpO2:    99%   General: NAD Cardiovascular: RRR Respiratory: CTA biL  Discharge Instructions    Medication List    STOP  taking these medications        aspirin 325 MG tablet      TAKE these medications        clopidogrel 75 MG tablet  Commonly known as:  PLAVIX  Take 1 tablet (75 mg total) by mouth daily.     diltiazem 360 MG 24 hr capsule  Commonly known as:  TIAZAC  Take 360 mg by mouth daily.     insulin regular human CONCENTRATED 500 UNIT/ML Soln injection  Commonly known as:  HUMULIN R  Inject 100 Units into the skin 2 (two) times daily with a  meal.     levothyroxine 200 MCG tablet  Commonly known as:  SYNTHROID, LEVOTHROID  Take 200 mcg by mouth daily.     metoprolol succinate 100 MG 24 hr tablet  Commonly known as:  TOPROL-XL  Take 200 mg by mouth daily. Take with or immediately following a meal.     rosuvastatin 20 MG tablet  Commonly known as:  CRESTOR  Take 20 mg by mouth daily.     telmisartan 40 MG tablet  Commonly known as:  MICARDIS  Take 2 tablets (80 mg total) by mouth daily.     UNABLE TO FIND  Outpatient Physical therapy     UNABLE TO FIND  Outpatient Occupational therapy       Follow-up Information    Follow up with Xu,Jindong, MD. Schedule an appointment as soon as possible for a visit in 1 month.   Specialty:  Neurology   Contact information:   [address] [place] [address] [phone]       Follow up with Nolene Ebbs A, MD. Schedule an appointment as soon as possible for a visit in 2 weeks.   Specialty:  Internal Medicine   Contact information:   [address] [place] [address] [phone]       Follow up with Jorja Loa, MD. Schedule an appointment as soon as possible for a visit in 3 weeks.   Specialty:  Urology   Contact information:   [address] [place] [address] [phone]       The results of significant diagnostics from this hospitalization (including imaging, microbiology, ancillary and laboratory) are listed below for reference.    Significant Diagnostic Studies: Ct Head Wo Contrast  09/16/2014   CLINICAL DATA:  Patient discharged 09/11/2014 history of cerebrovascular accident. Now with with numbness in the left side including the left arm and leg.  EXAM: CT HEAD WITHOUT CONTRAST  TECHNIQUE: Contiguous axial images were obtained from the base of the skull through the vertex without intravenous contrast.  COMPARISON:  Brain MRI and head CT scans 09/11/2014.  FINDINGS: Chronic microvascular ischemic change is again  identified. No evidence of acute infarction, hemorrhage, mass lesion, mass effect, midline shift or abnormal extra-axial fluid collection is identified. Hypoattenuation in the right thalamus is consistent with expected evolutionary change of the infarct seen on the prior MRI. The calvarium is intact.  IMPRESSION: No acute abnormality.  Chronic microvascular ischemic change and expected evolutionary change of a small right thalamic infarct seen on comparison MRI.   Electronically Signed   By: Inge Rise M.D.   On: 09/16/2014 12:28   Ct Head Wo Contrast  09/11/2014   CLINICAL DATA:  79 year old female with history diabetes and high blood pressure presenting with headache and left-sided weakness. Initial encounter.  EXAM: CT HEAD WITHOUT CONTRAST  TECHNIQUE: Contiguous axial images were obtained from  the base of the skull through the vertex without intravenous contrast.  COMPARISON:  No comparison head CT.  FINDINGS: No intracranial hemorrhage.  Small vessel disease type changes without CT evidence of large acute infarct.  No hydrocephalus.  No intracranial mass lesion noted on this unenhanced exam.  Vascular calcifications.  Mastoid air cells, middle ear cavities and visualized paranasal sinuses are clear.  Orbital structures unremarkable.  IMPRESSION: No intracranial hemorrhage or CT evidence of large acute infarct.  Small vessel disease type changes.  Please see above.   Electronically Signed   By: Chauncey Cruel M.D.   On: 09/11/2014 12:22   Ct Chest W Contrast  09/12/2014   CLINICAL DATA:  Initial evaluation for possbile paraneoplastic syndrome related to left arm and left leg decreased sensation, sensory neuropathy  EXAM: CT CHEST, ABDOMEN, AND PELVIS WITH CONTRAST  TECHNIQUE: Multidetector CT imaging of the chest, abdomen and pelvis was performed following the standard protocol during bolus administration of intravenous contrast.  CONTRAST:  170mL OMNIPAQUE IOHEXOL 300 MG/ML  SOLN  COMPARISON:   None.  FINDINGS: CT CHEST FINDINGS  There are borderline mediastinal lymph nodes come with a 10 mm aortopulmonary window lymph node as the largest. There is calcification of the aortic arch. There is coronary arterial calcification and moderate cardiac enlargement. There is a very small hiatal hernia. There is no significant pleural or pericardial effusion.  Thyroid is not identified. There are surgical clips in the region of the right thyroid. No significant pulmonary parenchymal opacification on the right identified. On the left, there is subsegmental atelectasis along the diaphragmatic surface of the lower lobe. The left lung is otherwise clear.  CT ABDOMEN AND PELVIS FINDINGS  There is a 33 x 35 x 28 mm solid enhancing mass arising from the upper pole the right kidney, within average attenuation value of 78. Right kidney is otherwise normal. Right renal vein is normal. Left kidney is normal.  There is hepatic steatosis of moderate severity. The spleen is normal. The adrenal glands are normal. The pancreas is atrophied with mild prominence of the pancreatic duct. There is a 19 mm cystic lesion in the tail of the pancreas and a 22 mm cystic lesion in the neck of the pancreas.  There is calcification of the abdominal aorta without dilatation. There is no significant retroperitoneal adenopathy. There is no significant mesenteric adenopathy.  Stomach and small bowel are normal. Appendix is normal. There is moderate diverticulosis of the sigmoid colon without evidence of diverticulitis.  Bladder is normal. Uterus is not identified. Ovaries appear normal except for a few small nonspecific calcifications involving both ovaries. There is a small fat containing right inguinal hernia.  There is no free fluid or inflammatory change in the abdomen or pelvis. There are no acute musculoskeletal findings. Patient is status post prior fusion at L4-5 level.  IMPRESSION: 1. No acute abnormalities in the thorax. Borderline  mediastinal lymph nodes. 2. Mass upper pole right kidney highly concerning for renal cell carcinoma. 3. Pancreatic cystic lesions with pancreatic ductal prominence. These may represent benign cysts or pseudocysts. However, given the size of the larger, further characterization with MRI is suggested if the patient would be a candidate for optimal MRI imaging. If the patient is not, then follow up CT in six to twelve months is suggested. This recommendation follows ACR consensus guidelines: Managing Incidental Findings on Abdominal CT: White Paper of the ACR Incidental Findings Committee. J Am Coll Radiol 2010;7:754-773 4. Sigmoid colon diverticulosis These results  will be called to the ordering clinician or representative by the Radiologist Assistant, and communication documented in the PACS or zVision Dashboard.   Electronically Signed   By: Skipper Cliche M.D.   On: 09/12/2014 08:28   Mr Jodene Nam Head Wo Contrast  09/17/2014   CLINICAL DATA:  Worsening LEFT-sided weakness with tingling and numbness beginning at 2 a.m. Unsteady gait, LEFT lower extremity weakness. History of stroke within the last week. History of diabetes, hypertension and dyslipidemia.  EXAM: MRI HEAD WITHOUT CONTRAST  MRA HEAD WITHOUT CONTRAST  TECHNIQUE: Multiplanar, multiecho pulse sequences of the brain and surrounding structures were obtained without intravenous contrast. Angiographic images of the head were obtained using MRA technique without contrast.  COMPARISON:  CT of the head September 16, 2014 at 12:11 p.m. and MRI an MRA of the brain September 11, 2014  FINDINGS: MRI HEAD FINDINGS  Mildly motion degraded examination.  Subcentimeter focus of reduced diffusion in the LEFT occipital lobe, considering peripheral location favored to represent artifact. Patient's known RIGHT thalamus small infarct better seen on today's 3 tesla scanner. Residual decreased ADC values. Stable tiny microhemorrhage in LEFT mesial thalamus. No midline shift or mass  effect.  Ventricles and sulci are normal for patient's age. Patchy to confluent supratentorial white matter FLAIR T2 hyperintensities, unchanged. Remote small pontine infarct tiny T2 hyperintensities in the basal ganglia favor perivascular spaces.  No abnormal extra-axial fluid collections. Ocular globes and orbital contents are unremarkable. No paranasal sinus air-fluid levels. Mastoid air cells appear well-aerated. No abnormal sellar expansion. No cerebellar tonsillar ectopia. Patient is edentulous.  MRA HEAD FINDINGS  Anterior circulation: Normal flow related enhancement of the included cervical, petrous, cavernous and supra clinoid internal carotid arteries. Slightly lobulated contour of the anterior genu of the RIGHT internal carotid artery, which may in part may reflect ophthalmic artery origin infundibulum. Patent anterior communicating artery. Normal flow related enhancement of the anterior and middle cerebral arteries, including more distal segments.  No large vessel occlusion, high-grade stenosis, abnormal luminal irregularity.  Posterior circulation: RIGHT vertebral artery is dominant. Basilar artery is patent, with normal flow related enhancement of the main branch vessels. Normal flow related enhancement of the posterior cerebral arteries. Robust LEFT posterior communicating artery present.  No large vessel occlusion, high-grade stenosis, abnormal luminal irregularity, aneurysm.  IMPRESSION: MRI HEAD: Evolving acute to subacute RIGHT thalamic infarct, no hemorrhagic conversion. Tiny focus of likely spurious reduced diffusion LEFT occipital lobe.  Moderate white matter changes suggest chronic small vessel ischemic disease. Remote pontine small infarct.  MRA HEAD: No high-grade stenosis or acute vascular process.  No convincing evidence of para ophthalmic aneurysm though, this is equivocal on two consecutive MRA examinations, CT angiogram of the head may provide further delineation, on a nonemergent  basis.   Electronically Signed   By: Elon Alas   On: 09/17/2014 00:46   Mr Brain Wo Contrast  09/17/2014   CLINICAL DATA:  Worsening LEFT-sided weakness with tingling and numbness beginning at 2 a.m. Unsteady gait, LEFT lower extremity weakness. History of stroke within the last week. History of diabetes, hypertension and dyslipidemia.  EXAM: MRI HEAD WITHOUT CONTRAST  MRA HEAD WITHOUT CONTRAST  TECHNIQUE: Multiplanar, multiecho pulse sequences of the brain and surrounding structures were obtained without intravenous contrast. Angiographic images of the head were obtained using MRA technique without contrast.  COMPARISON:  CT of the head September 16, 2014 at 12:11 p.m. and MRI an MRA of the brain September 11, 2014  FINDINGS: MRI HEAD FINDINGS  Mildly motion degraded examination.  Subcentimeter focus of reduced diffusion in the LEFT occipital lobe, considering peripheral location favored to represent artifact. Patient's known RIGHT thalamus small infarct better seen on today's 3 tesla scanner. Residual decreased ADC values. Stable tiny microhemorrhage in LEFT mesial thalamus. No midline shift or mass effect.  Ventricles and sulci are normal for patient's age. Patchy to confluent supratentorial white matter FLAIR T2 hyperintensities, unchanged. Remote small pontine infarct tiny T2 hyperintensities in the basal ganglia favor perivascular spaces.  No abnormal extra-axial fluid collections. Ocular globes and orbital contents are unremarkable. No paranasal sinus air-fluid levels. Mastoid air cells appear well-aerated. No abnormal sellar expansion. No cerebellar tonsillar ectopia. Patient is edentulous.  MRA HEAD FINDINGS  Anterior circulation: Normal flow related enhancement of the included cervical, petrous, cavernous and supra clinoid internal carotid arteries. Slightly lobulated contour of the anterior genu of the RIGHT internal carotid artery, which may in part may reflect ophthalmic artery origin  infundibulum. Patent anterior communicating artery. Normal flow related enhancement of the anterior and middle cerebral arteries, including more distal segments.  No large vessel occlusion, high-grade stenosis, abnormal luminal irregularity.  Posterior circulation: RIGHT vertebral artery is dominant. Basilar artery is patent, with normal flow related enhancement of the main branch vessels. Normal flow related enhancement of the posterior cerebral arteries. Robust LEFT posterior communicating artery present.  No large vessel occlusion, high-grade stenosis, abnormal luminal irregularity, aneurysm.  IMPRESSION: MRI HEAD: Evolving acute to subacute RIGHT thalamic infarct, no hemorrhagic conversion. Tiny focus of likely spurious reduced diffusion LEFT occipital lobe.  Moderate white matter changes suggest chronic small vessel ischemic disease. Remote pontine small infarct.  MRA HEAD: No high-grade stenosis or acute vascular process.  No convincing evidence of para ophthalmic aneurysm though, this is equivocal on two consecutive MRA examinations, CT angiogram of the head may provide further delineation, on a nonemergent basis.   Electronically Signed   By: Elon Alas   On: 09/17/2014 00:46   Mr Brain Wo Contrast  09/11/2014   CLINICAL DATA:  Severe left-sided headache with left-sided numbness and tingling. Transient blurred vision. History of hypertension and diabetes.  EXAM: MRI HEAD WITHOUT CONTRAST  MRA HEAD WITHOUT CONTRAST  TECHNIQUE: Multiplanar, multiecho pulse sequences of the brain and surrounding structures were obtained without intravenous contrast. Angiographic images of the head were obtained using MRA technique without contrast.  COMPARISON:  Head CT 09/11/2014  FINDINGS: MRI HEAD FINDINGS  There is an 8 mm acute infarct in the right thalamus. There is no evidence of intracranial hemorrhage, mass, midline shift, or extra-axial fluid collection. Ventricles and sulci are normal. Patchy T2  hyperintensities in the cerebral white matter are nonspecific but compatible with mild-to-moderate chronic small vessel ischemic disease. Small, chronic infarcts are noted in the left pons, right thalamus, and left basal ganglia.  Orbits are unremarkable. Paranasal sinuses and mastoid air cells are clear. Major intracranial vascular flow voids are preserved.  MRA HEAD FINDINGS  Visualized distal vertebral arteries are patent with the right being dominant. There is irregularity and mild narrowing of the distal left vertebral artery. PICA and SCA origins are patent. Basilar artery is patent with minimal narrowing in its midportion. PCAs are unremarkable. There is a patent left posterior communicating artery.  Internal carotid arteries are patent from skullbase to carotid termini without stenosis. There is a small bulge measuring approximately 3 mm extending inferomedially from the right internal carotid artery near the ophthalmic artery origin which may reflect a tiny aneurysm, although there  is mild motion artifact through this area. M1 and A1 segments are patent without stenosis. There is mild bilateral A2 and MCA branch vessel irregularity suggestive of atherosclerosis.  IMPRESSION: 1. Acute right thalamic infarct. 2. Mild to moderate chronic small vessel ischemic disease. 3. Chronic lacunar infarcts in the deep gray nuclei and pons. 4. No major intracranial arterial occlusion. Intracranial atherosclerosis with mild narrowing of the distal left vertebral artery and minimal mid basilar narrowing. 5. 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   Electronically Signed   By: Logan Bores   On: 09/11/2014 19:11   Mr Cervical Spine Wo Contrast  09/11/2014   CLINICAL DATA:  Severe left-sided headache with left-sided numbness and tingling.  EXAM: MRI CERVICAL SPINE WITHOUT CONTRAST  TECHNIQUE: Multiplanar, multisequence MR imaging of the cervical spine was performed. No intravenous contrast was administered.   COMPARISON:  Neck CT 07/29/2005  FINDINGS: Images are mildly degraded by motion artifact. There is straightening of the normal cervical lordosis with trace retrolisthesis of C4 on C5 and C5 on C6. There is moderate to severe disc space narrowing at C4-5 and C5-6 with moderate narrowing at C6-7. Associated degenerative endplate changes are present. A hemangioma is noted in the C6 vertebral body. Craniocervical junction is unremarkable. Cervical spinal cord is normal in caliber and signal. Paraspinal soft tissues are unremarkable.  C2-3: Shallow central disc protrusion and right greater than left facet arthrosis without stenosis.  C3-4: Broad-based posterior disc osteophyte complex and right facet arthrosis result in mild bilateral neural foraminal stenosis without spinal stenosis.  C4-5: Broad-based posterior disc osteophyte complex results in borderline spinal stenosis and moderate to severe bilateral neural foraminal stenosis.  C5-6: Broad-based posterior disc osteophyte complex results in mild spinal stenosis and severe bilateral neural foraminal stenosis.  C6-7: Broad-based posterior disc osteophyte complex results in borderline spinal stenosis and moderate to severe bilateral neural foraminal stenosis.  C7-T1:  Negative.  IMPRESSION: Moderate to severe multilevel cervical disc degeneration, most notable at C5-6 where there is mild spinal stenosis and severe bilateral neural foraminal stenosis.   Electronically Signed   By: Logan Bores   On: 09/11/2014 19:33   Ct Abdomen Pelvis W Contrast  09/12/2014   CLINICAL DATA:  Initial evaluation for possbile paraneoplastic syndrome related to left arm and left leg decreased sensation, sensory neuropathy  EXAM: CT CHEST, ABDOMEN, AND PELVIS WITH CONTRAST  TECHNIQUE: Multidetector CT imaging of the chest, abdomen and pelvis was performed following the standard protocol during bolus administration of intravenous contrast.  CONTRAST:  144mL OMNIPAQUE IOHEXOL 300 MG/ML   SOLN  COMPARISON:  None.  FINDINGS: CT CHEST FINDINGS  There are borderline mediastinal lymph nodes come with a 10 mm aortopulmonary window lymph node as the largest. There is calcification of the aortic arch. There is coronary arterial calcification and moderate cardiac enlargement. There is a very small hiatal hernia. There is no significant pleural or pericardial effusion.  Thyroid is not identified. There are surgical clips in the region of the right thyroid. No significant pulmonary parenchymal opacification on the right identified. On the left, there is subsegmental atelectasis along the diaphragmatic surface of the lower lobe. The left lung is otherwise clear.  CT ABDOMEN AND PELVIS FINDINGS  There is a 33 x 35 x 28 mm solid enhancing mass arising from the upper pole the right kidney, within average attenuation value of 78. Right kidney is otherwise normal. Right renal vein is normal. Left kidney is normal.  There is hepatic steatosis of  moderate severity. The spleen is normal. The adrenal glands are normal. The pancreas is atrophied with mild prominence of the pancreatic duct. There is a 19 mm cystic lesion in the tail of the pancreas and a 22 mm cystic lesion in the neck of the pancreas.  There is calcification of the abdominal aorta without dilatation. There is no significant retroperitoneal adenopathy. There is no significant mesenteric adenopathy.  Stomach and small bowel are normal. Appendix is normal. There is moderate diverticulosis of the sigmoid colon without evidence of diverticulitis.  Bladder is normal. Uterus is not identified. Ovaries appear normal except for a few small nonspecific calcifications involving both ovaries. There is a small fat containing right inguinal hernia.  There is no free fluid or inflammatory change in the abdomen or pelvis. There are no acute musculoskeletal findings. Patient is status post prior fusion at L4-5 level.  IMPRESSION: 1. No acute abnormalities in the thorax.  Borderline mediastinal lymph nodes. 2. Mass upper pole right kidney highly concerning for renal cell carcinoma. 3. Pancreatic cystic lesions with pancreatic ductal prominence. These may represent benign cysts or pseudocysts. However, given the size of the larger, further characterization with MRI is suggested if the patient would be a candidate for optimal MRI imaging. If the patient is not, then follow up CT in six to twelve months is suggested. This recommendation follows ACR consensus guidelines: Managing Incidental Findings on Abdominal CT: White Paper of the ACR Incidental Findings Committee. J Am Coll Radiol 2010;7:754-773 4. Sigmoid colon diverticulosis These results will be called to the ordering clinician or representative by the Radiologist Assistant, and communication documented in the PACS or zVision Dashboard.   Electronically Signed   By: Skipper Cliche M.D.   On: 09/12/2014 08:28   Dg Bone Density  08/22/2014   CLINICAL DATA:  Postmenopausal. Patient takes vitamin-D.  EXAM: DUAL X-RAY ABSORPTIOMETRY (DXA) FOR BONE MINERAL DENSITY  FINDINGS: AP LUMBAR SPINE (L1-L3)  Bone Mineral Density (BMD):  1.515 G/cm2  Young Adult T-Score:  4.5  Z-Score:  6.3  LEFT FEMUR NECK  Bone Mineral Density (BMD):  1.089 g/cm2  Young Adult T-Score: 2.2  Z-Score:  2.6  ASSESSMENT: Patient's diagnostic category is NORMAL by WHO Criteria.  FRACTURE RISK: NOT INCREASED  FRAX: World Health Organization FRAX assessment of absolute fracture risk is not calculated for this patient because the patient has all T-scores at or above -1.0.  COMPARISON: None.  Effective therapies are available in the form of bisphosphonates, selective estrogen receptor modulators, biologic agents, and hormone replacement therapy (for women). All patients should ensure an adequate intake of dietary calcium (1200 mg daily) and vitamin D (800 IU daily) unless contraindicated.  All treatment decisions require clinical judgment and consideration of  individual patient factors, including patient preferences, co-morbidities, previous drug use, risk factors not captured in the FRAX model (e.g., frailty, falls, vitamin D deficiency, increased bone turnover, interval significant decline in bone density) and possible under- or over-estimation of fracture risk by FRAX.  The National Osteoporosis Foundation recommends that FDA-approved medical therapies be considered in postmenopausal women and men age 103 or older with a:  1. Hip or vertebral (clinical or morphometric) fracture.  2. T-score of -2.5 or lower at the spine or hip.  3. Ten-year fracture probability by FRAX of 3% or greater for hip fracture or 20% or greater for major osteoporotic fracture.  People with diagnosed cases of osteoporosis or at high risk for fracture should have regular bone mineral density tests. For  patients eligible for Medicare, routine testing is allowed once every 2 years. The testing frequency can be increased to one year for patients who have rapidly progressing disease, those who are receiving or discontinuing medical therapy to restore bone mass, or have additional risk factors.  World Pharmacologist [facility]) Criteria:  Normal: T-scores from +1.0 to -1.0  Low Bone Mass (Osteopenia): T-scores between -1.0 and -2.5  Osteoporosis: T-scores -2.5 and below  Comparison to Reference Population:  T-score is the key measure used in the diagnosis of osteoporosis and relative risk determination for fracture. It provides a value for bone mass relative to the mean bone mass of a young adult reference population expressed in terms of standard deviation (SD).  Z-score is the age-matched score showing the patient's values compared to a population matched for age, sex, and race. This is also expressed in terms of standard deviation. The patient may have values that compare favorably to the age-matched values and still be at increased risk for fracture.   Electronically Signed   By: Lillia Mountain M.D.    On: 08/22/2014 13:01   Mr Jodene Nam Head/brain Wo Cm  09/11/2014   CLINICAL DATA:  Severe left-sided headache with left-sided numbness and tingling. Transient blurred vision. History of hypertension and diabetes.  EXAM: MRI HEAD WITHOUT CONTRAST  MRA HEAD WITHOUT CONTRAST  TECHNIQUE: Multiplanar, multiecho pulse sequences of the brain and surrounding structures were obtained without intravenous contrast. Angiographic images of the head were obtained using MRA technique without contrast.  COMPARISON:  Head CT 09/11/2014  FINDINGS: MRI HEAD FINDINGS  There is an 8 mm acute infarct in the right thalamus. There is no evidence of intracranial hemorrhage, mass, midline shift, or extra-axial fluid collection. Ventricles and sulci are normal. Patchy T2 hyperintensities in the cerebral white matter are nonspecific but compatible with mild-to-moderate chronic small vessel ischemic disease. Small, chronic infarcts are noted in the left pons, right thalamus, and left basal ganglia.  Orbits are unremarkable. Paranasal sinuses and mastoid air cells are clear. Major intracranial vascular flow voids are preserved.  MRA HEAD FINDINGS  Visualized distal vertebral arteries are patent with the right being dominant. There is irregularity and mild narrowing of the distal left vertebral artery. PICA and SCA origins are patent. Basilar artery is patent with minimal narrowing in its midportion. PCAs are unremarkable. There is a patent left posterior communicating artery.  Internal carotid arteries are patent from skullbase to carotid termini without stenosis. There is a small bulge measuring approximately 3 mm extending inferomedially from the right internal carotid artery near the ophthalmic artery origin which may reflect a tiny aneurysm, although there is mild motion artifact through this area. M1 and A1 segments are patent without stenosis. There is mild bilateral A2 and MCA branch vessel irregularity suggestive of atherosclerosis.   IMPRESSION: 1. Acute right thalamic infarct. 2. Mild to moderate chronic small vessel ischemic disease. 3. Chronic lacunar infarcts in the deep gray nuclei and pons. 4. No major intracranial arterial occlusion. Intracranial atherosclerosis with mild narrowing of the distal left vertebral artery and minimal mid basilar narrowing. 5. 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   Electronically Signed   By: Logan Bores   On: 09/11/2014 19:11   Labs: Basic Metabolic Panel:  Recent Labs Lab 09/12/14 0502 09/16/14 1110  NA 139 138  K 4.0 3.9  CL 103 105  CO2 24 24  GLUCOSE 203* 230*  BUN 10 22  CREATININE 0.66 0.83  CALCIUM 8.8 8.9  Liver Function Tests:  Recent Labs Lab 09/16/14 1110  AST 25  ALT 21  ALKPHOS 57  BILITOT 0.9  PROT 7.1  ALBUMIN 3.6   CBC:  Recent Labs Lab 09/16/14 1110  WBC 5.2  NEUTROABS 3.2  HGB 12.8  HCT 39.4  MCV 93.4  PLT 236   Cardiac Enzymes:  Recent Labs Lab 09/16/14 1110  TROPONINI <0.03   CBG:  Recent Labs Lab 09/17/14 1139 09/17/14 1637 09/17/14 2124 09/18/14 0639 09/18/14 1141  GLUCAP 195* 151* 204* 177* 182*    Signed:  Naman Spychalski  [facility] 09/18/2014, 4:16 PM

## 2014-09-18 NOTE — Progress Notes (Signed)
Discharge orders received, pt for discharge home today with home health PT per [facility], pt requesting to have her daughter pick up the DME later this afternoon so that she can go ahead and leave at this time.  IV D/C with dressing CDI to lower back.  D/C instructions and Rx given with verbalized understanding.  Family at bedside to assist pt with discharge. Staff brought pt downstairs via wheelchair.

## 2014-09-18 NOTE — Progress Notes (Signed)
Patient left without receiving her DME; Jermaine with [facility] made aware, he stated that he will contact the patient and have her pick up the DME at the [facility]; The DME cannot be left at the nursing station or anywhere on the unit because the patient have to sign for the DME before it can be released; [place] RN,BSN,MHA [phone]

## 2014-09-18 NOTE — Progress Notes (Signed)
Occupational Therapy Evaluation Patient Details Name: Kaylee Mullins MRN: [id number] DOB: [birth year] Today's Date: 09/18/2014    History of Present Illness 79 y.o. female with a history of  hypertension, hypothyroidism, diabetes mellitus, thyroid cancer, and an acute right thalamic infarct 09/11/2014. Evolving acute to subacute RIGHT thalamic infarct.   Clinical Impression   PTA, pt lived alone and was independent with ADL. Feel pt will be able to D/C home @ RW level with HHOT and 24/7 S. Prior to D/C, pt will need to navigate stairs safely with PT. Pt states her daughter will stay with her overnight and her sister can be there during the day. Educated on warning signs/symptoms of CVA. Will follow to address established goals.     Follow Up Recommendations  Home health OT;Supervision/Assistance - 24 hour (pt states can be arranged)    Equipment Recommendations  Other (comment) (RW)    Recommendations for Other Services       Precautions / Restrictions Precautions Precautions: Fall Restrictions Weight Bearing Restrictions: No      Mobility Bed Mobility Overal bed mobility: Modified Independent                Transfers Overall transfer level: Needs assistance Equipment used: Rolling walker (2 wheeled) Transfers: Sit to/from [facility] Sit to Stand: Supervision Stand pivot transfers: Min guard       General transfer comment: min guard due to LLE sensory deficits    Balance Overall balance assessment: Needs assistance Sitting-balance support: Feet supported Sitting balance-Leahy Scale: Good     Standing balance support: During functional activity Standing balance-Leahy Scale: Fair                              ADL Overall ADL's : Needs assistance/impaired     Grooming: Set up;Supervision/safety;Standing   Upper Body Bathing: Supervision/ safety;Set up;Sitting   Lower Body Bathing: Min guard;Sit to/from stand   Upper Body  Dressing : Supervision/safety;Set up;Standing   Lower Body Dressing: Min guard;Sit to/from stand   Toilet Transfer: Min guard;Ambulation;Comfort height toilet;RW   Toileting- Clothing Manipulation and Hygiene: Supervision/safety;Sit to/from stand       Functional mobility during ADLs: Min guard;Rolling walker;Cueing for safety. When turning toward L, pt caught LLE in RW and required VC to correct. No LOB.  General ADL Comments: Pt with decreased awareness of position of self and LLE in RW - poor orientation of self. Able to return demonstrate improved positioning with vc.     Vision Eye Alignment: Within Functional Limits Alignment/Gaze Preference: Within Defined Limits Ocular Range of Motion: Within Functional Limits Tracking/Visual Pursuits: Able to track stimulus in all quads without difficulty Saccades: Within functional limits Convergence: Within functional limits     Additional Comments: will furhter assess   Perception     Praxis Praxis Praxis tested?: Within functional limits    Pertinent Vitals/Pain Pain Assessment: No/denies pain     Hand Dominance Right   Extremity/Trunk Assessment Upper Extremity Assessment Upper Extremity Assessment: LUE deficits/detail LUE Deficits / Details: L hemisensory deficits; mild weakness. @ 3+/5 proximally; 4/5 distally; dysmetria LUE Sensation: decreased light touch;decreased proprioception LUE Coordination: decreased fine motor;decreased gross motor ("clumsy hand")   Lower Extremity Assessment Lower Extremity Assessment: Defer to PT evaluation   Cervical / Trunk Assessment Cervical / Trunk Assessment: Normal   Communication Communication Communication: No difficulties   Cognition Arousal/Alertness: Awake/alert Behavior During Therapy: WFL for tasks assessed/performed Overall Cognitive Status:  Within Functional Limits for tasks assessed                     General Comments       Exercises Exercises: Other  exercises Other Exercises Other Exercises: fine motor/gross motor and B integrated tasks   Shoulder Instructions      Home Living Family/patient expects to be discharged to:: Private residence Living Arrangements: Alone Available Help at Discharge: Available PRN/intermittently Type of Home: House Home Access: Stairs to enter [employer] of Steps: 12 Entrance Stairs-Rails: Right Home Layout: Two level Alternate Level Stairs-Number of Steps: 1 flight Alternate Level Stairs-Rails: Right;Left Bathroom Shower/Tub: Occupational psychologist: Standard Bathroom Accessibility: Yes How Accessible: Accessible via walker Home Equipment: Kasandra Knudsen - single point      Lives With: Alone    Prior Functioning/Environment Level of Independence: Independent        Comments: drives/takes care of "everybody else"    OT Diagnosis: Generalized weakness;Other (comment) (hemisensory deficit)   OT Problem List: Decreased strength;Decreased activity tolerance;Impaired balance (sitting and/or standing);Decreased coordination;Decreased safety awareness;Decreased knowledge of use of DME or AE;Impaired sensation;Obesity;Impaired UE functional use   OT Treatment/Interventions: Self-care/ADL training;Therapeutic exercise;Neuromuscular education;Therapeutic activities;Patient/family education;Balance training;DME and/or AE instruction    OT Goals(Current goals can be found in the care plan section) Acute Rehab OT Goals Patient Stated Goal: to be able to live be myself OT Goal Formulation: With patient Time For Goal Achievement: 10/02/14 Potential to Achieve Goals: Good  OT Frequency: Min 2X/week   Barriers to D/C:            Co-evaluation              End of Session Equipment Utilized During Treatment: Gait belt;Rolling walker Nurse Communication: Mobility status  Activity Tolerance: Patient tolerated treatment well Patient left: in chair;with call bell/phone within  reach;with chair alarm set;with nursing/sitter in room   Time: 1008-1050 OT Time Calculation (min): 42 min Charges:  OT General Charges $OT Visit: 1 Procedure OT Evaluation $Initial OT Evaluation Tier I: 1 Procedure OT Treatments $Self Care/Home Management : 23-37 mins G-Codes:    Eryc Bodey,HILLARY 16-Oct-2014, 11:16 AM   Maurie Boettcher, OTR/L  [phone] 10-16-14

## 2014-09-18 NOTE — Discharge Instructions (Addendum)
Please discuss with your Urologist about Plavix use and potential surgery as you will need to coordinate with him how many days you will need to stop Plavix before any planned procedures.    Follow with AVBUERE,EDWIN A, MD in 5-7 days  Please get a complete blood count and chemistry panel checked by your Primary MD at your next visit, and again as instructed by your Primary MD. Please get your medications reviewed and adjusted by your Primary MD.  Please request your Primary MD to go over all Hospital Tests and Procedure/Radiological results at the follow up, please get all Hospital records sent to your Prim MD by signing hospital release before you go home.  If you had Pneumonia of Lung problems at the Hospital: Please get a 2 view Chest X ray done in 6-8 weeks after hospital discharge or sooner if instructed by your Primary MD.  If you have Congestive Heart Failure: Please call your Cardiologist or Primary MD anytime you have any of the following symptoms:  1) 3 pound weight gain in 24 hours or 5 pounds in 1 week  2) shortness of breath, with or without a dry hacking cough  3) swelling in the hands, feet or stomach  4) if you have to sleep on extra pillows at night in order to breathe  Follow cardiac low salt diet and 1.5 lit/day fluid restriction.  If you have diabetes Accuchecks 4 times/day, Once in AM empty stomach and then before each meal. Log in all results and show them to your primary doctor at your next visit. If any glucose reading is under 80 or above 300 call your primary MD immediately.  If you have Seizure/Convulsions/Epilepsy: Please do not drive, operate heavy machinery, participate in activities at heights or participate in high speed sports until you have seen by Primary MD or a Neurologist and advised to do so again.  If you had Gastrointestinal Bleeding: Please ask your Primary MD to check a complete blood count within one week of discharge or at your next visit.  Your endoscopic/colonoscopic biopsies that are pending at the time of discharge, will also need to followed by your Primary MD.  Get Medicines reviewed and adjusted. Please take all your medications with you for your next visit with your Primary MD  Please request your Primary MD to go over all hospital tests and procedure/radiological results at the follow up, please ask your Primary MD to get all Hospital records sent to his/her office.  If you experience worsening of your admission symptoms, develop shortness of breath, life threatening emergency, suicidal or homicidal thoughts you must seek medical attention immediately by calling 911 or calling your MD immediately  if symptoms less severe.  You must read complete instructions/literature along with all the possible adverse reactions/side effects for all the Medicines you take and that have been prescribed to you. Take any new Medicines after you have completely understood and accpet all the possible adverse reactions/side effects.   Do not drive or operate heavy machinery when taking Pain medications.   Do not take more than prescribed Pain, Sleep and Anxiety Medications  Special Instructions: If you have smoked or chewed Tobacco  in the last 2 yrs please stop smoking, stop any regular Alcohol  and or any Recreational drug use.  Wear Seat belts while driving.  Please note You were cared for by a hospitalist during your hospital stay. If you have any questions about your discharge medications or the care you received while  you were in the hospital after you are discharged, you can call the unit and asked to speak with the hospitalist on call if the hospitalist that took care of you is not available. Once you are discharged, your primary care physician will handle any further medical issues. Please note that NO REFILLS for any discharge medications will be authorized once you are discharged, as it is imperative that you return to your primary  care physician (or establish a relationship with a primary care physician if you do not have one) for your aftercare needs so that they can reassess your need for medications and monitor your lab values.  You can reach the hospitalist office at phone [phone] or fax [phone]   If you do not have a primary care physician, you can call [phone] for a physician referral.  Activity: As tolerated with Full fall precautions use walker/cane & assistance as needed  Diet: heart healthy  Disposition Home

## 2014-09-18 NOTE — Progress Notes (Signed)
Physical Therapy Treatment Patient Details Name: Kaylee Mullins MRN: [id number] DOB: 11-26-1935 Today's Date: 09/18/2014    History of Present Illness 79 y.o. female with a history of  hypertension, hypothyroidism, diabetes mellitus, thyroid cancer, and an acute right thalamic infarct 09/11/2014. Evolving acute to subacute RIGHT thalamic infarct.    PT Comments    Patient progressing well with mobility. Tolerated stair negotiation with supervision for safety. Mild ataxia present LLE however improved from prior session. Pt requires use of RW for safety. Pt will have 24/7 S at home at discharge. Will continue to follow and progress as tolerated.   Follow Up Recommendations  Outpatient PT;Supervision - Intermittent     Equipment Recommendations  Rolling walker with 5" wheels    Recommendations for Other Services       Precautions / Restrictions Precautions Precautions: Fall Restrictions Weight Bearing Restrictions: No    Mobility  Bed Mobility Overal bed mobility: Modified Independent             General bed mobility comments: Received ambulating in room upon PT arrival.   Transfers Overall transfer level: Needs assistance Equipment used: Rolling walker (2 wheeled) Transfers: Sit to/from Stand Sit to Stand: Supervision Stand pivot transfers: Min guard       General transfer comment: Supervision for safety.   Ambulation/Gait Ambulation/Gait assistance: Supervision Ambulation Distance (Feet): 150 Feet Assistive device: Rolling walker (2 wheeled) Gait Pattern/deviations: Step-through pattern;Decreased stride length;Ataxic;Wide base of support     General Gait Details: Pt with slow and steady gait with use of RW. Mild ataxia LLE however more controlled than yesterday.   Stairs Stairs: Yes Stairs assistance: Supervision Stair Management: One rail Right;Step to pattern;Forwards Number of Stairs: 12 General stair comments: Supervision for safety and cues for  technique. Son present during stair training. Educated pt to have family present for stair negotiation.  Wheelchair Mobility    Modified Rankin (Stroke Patients Only) Modified Rankin (Stroke Patients Only) Pre-Morbid Rankin Score: No significant disability Modified Rankin: Moderately severe disability     Balance Overall balance assessment: Needs assistance Sitting-balance support: Feet supported;No upper extremity supported Sitting balance-Leahy Scale: Good     Standing balance support: During functional activity Standing balance-Leahy Scale: Fair                      Cognition Arousal/Alertness: Awake/alert Behavior During Therapy: WFL for tasks assessed/performed Overall Cognitive Status: Within Functional Limits for tasks assessed                      Exercises Other Exercises Other Exercises: fine motor/gross motor and B integrated tasks    General Comments        Pertinent Vitals/Pain Pain Assessment: No/denies pain    Home Living Family/patient expects to be discharged to:: Private residence Living Arrangements: Alone Available Help at Discharge: Available PRN/intermittently Type of Home: House Home Access: Stairs to enter Entrance Stairs-Rails: Right Home Layout: Two level Home Equipment: Cane - single point      Prior Function Level of Independence: Independent      Comments: drives/takes care of "everybody else"   PT Goals (current goals can now be found in the care plan section) Acute Rehab PT Goals Patient Stated Goal: to be able to live be myself Progress towards PT goals: Progressing toward goals    Frequency  Min 4X/week    PT Plan Current plan remains appropriate    Co-evaluation  End of Session Equipment Utilized During Treatment: Gait belt Activity Tolerance: Patient tolerated treatment well Patient left: Other (comment);with family/visitor present (in bathroom putting on bra.)     Time:  8979-1504 PT Time Calculation (min) (ACUTE ONLY): 13 min  Charges:  $Gait Training: 8-22 mins                    G CodesCandy Sledge A Oct 03, 2014, 1:56 PM  Candy Sledge, [place], DPT [phone]

## 2014-09-19 DIAGNOSIS — E785 Hyperlipidemia, unspecified: Secondary | ICD-10-CM | POA: Diagnosis not present

## 2014-09-19 DIAGNOSIS — I10 Essential (primary) hypertension: Secondary | ICD-10-CM | POA: Diagnosis not present

## 2014-09-19 DIAGNOSIS — E119 Type 2 diabetes mellitus without complications: Secondary | ICD-10-CM | POA: Diagnosis not present

## 2014-09-19 DIAGNOSIS — I69354 Hemiplegia and hemiparesis following cerebral infarction affecting left non-dominant side: Secondary | ICD-10-CM | POA: Diagnosis not present

## 2014-09-20 DIAGNOSIS — I69354 Hemiplegia and hemiparesis following cerebral infarction affecting left non-dominant side: Secondary | ICD-10-CM | POA: Diagnosis not present

## 2014-09-20 DIAGNOSIS — E119 Type 2 diabetes mellitus without complications: Secondary | ICD-10-CM | POA: Diagnosis not present

## 2014-09-20 DIAGNOSIS — E785 Hyperlipidemia, unspecified: Secondary | ICD-10-CM | POA: Diagnosis not present

## 2014-09-20 DIAGNOSIS — I10 Essential (primary) hypertension: Secondary | ICD-10-CM | POA: Diagnosis not present

## 2014-09-24 DIAGNOSIS — I69354 Hemiplegia and hemiparesis following cerebral infarction affecting left non-dominant side: Secondary | ICD-10-CM | POA: Diagnosis not present

## 2014-09-24 DIAGNOSIS — E119 Type 2 diabetes mellitus without complications: Secondary | ICD-10-CM | POA: Diagnosis not present

## 2014-09-24 DIAGNOSIS — I10 Essential (primary) hypertension: Secondary | ICD-10-CM | POA: Diagnosis not present

## 2014-09-24 DIAGNOSIS — E785 Hyperlipidemia, unspecified: Secondary | ICD-10-CM | POA: Diagnosis not present

## 2014-09-25 DIAGNOSIS — E785 Hyperlipidemia, unspecified: Secondary | ICD-10-CM | POA: Diagnosis not present

## 2014-09-25 DIAGNOSIS — I10 Essential (primary) hypertension: Secondary | ICD-10-CM | POA: Diagnosis not present

## 2014-09-25 DIAGNOSIS — E119 Type 2 diabetes mellitus without complications: Secondary | ICD-10-CM | POA: Diagnosis not present

## 2014-09-25 DIAGNOSIS — I69354 Hemiplegia and hemiparesis following cerebral infarction affecting left non-dominant side: Secondary | ICD-10-CM | POA: Diagnosis not present

## 2014-09-26 DIAGNOSIS — E119 Type 2 diabetes mellitus without complications: Secondary | ICD-10-CM | POA: Diagnosis not present

## 2014-09-26 DIAGNOSIS — I69354 Hemiplegia and hemiparesis following cerebral infarction affecting left non-dominant side: Secondary | ICD-10-CM | POA: Diagnosis not present

## 2014-09-26 DIAGNOSIS — E785 Hyperlipidemia, unspecified: Secondary | ICD-10-CM | POA: Diagnosis not present

## 2014-09-26 DIAGNOSIS — I10 Essential (primary) hypertension: Secondary | ICD-10-CM | POA: Diagnosis not present

## 2014-09-27 DIAGNOSIS — E785 Hyperlipidemia, unspecified: Secondary | ICD-10-CM | POA: Diagnosis not present

## 2014-09-27 DIAGNOSIS — E119 Type 2 diabetes mellitus without complications: Secondary | ICD-10-CM | POA: Diagnosis not present

## 2014-09-27 DIAGNOSIS — I10 Essential (primary) hypertension: Secondary | ICD-10-CM | POA: Diagnosis not present

## 2014-09-27 DIAGNOSIS — I69354 Hemiplegia and hemiparesis following cerebral infarction affecting left non-dominant side: Secondary | ICD-10-CM | POA: Diagnosis not present

## 2014-09-28 DIAGNOSIS — E785 Hyperlipidemia, unspecified: Secondary | ICD-10-CM | POA: Diagnosis not present

## 2014-09-28 DIAGNOSIS — I69354 Hemiplegia and hemiparesis following cerebral infarction affecting left non-dominant side: Secondary | ICD-10-CM | POA: Diagnosis not present

## 2014-09-28 DIAGNOSIS — I10 Essential (primary) hypertension: Secondary | ICD-10-CM | POA: Diagnosis not present

## 2014-09-28 DIAGNOSIS — E119 Type 2 diabetes mellitus without complications: Secondary | ICD-10-CM | POA: Diagnosis not present

## 2014-10-01 DIAGNOSIS — E785 Hyperlipidemia, unspecified: Secondary | ICD-10-CM | POA: Diagnosis not present

## 2014-10-01 DIAGNOSIS — I69354 Hemiplegia and hemiparesis following cerebral infarction affecting left non-dominant side: Secondary | ICD-10-CM | POA: Diagnosis not present

## 2014-10-01 DIAGNOSIS — I10 Essential (primary) hypertension: Secondary | ICD-10-CM | POA: Diagnosis not present

## 2014-10-01 DIAGNOSIS — E119 Type 2 diabetes mellitus without complications: Secondary | ICD-10-CM | POA: Diagnosis not present

## 2014-10-02 DIAGNOSIS — I69354 Hemiplegia and hemiparesis following cerebral infarction affecting left non-dominant side: Secondary | ICD-10-CM | POA: Diagnosis not present

## 2014-10-02 DIAGNOSIS — E119 Type 2 diabetes mellitus without complications: Secondary | ICD-10-CM | POA: Diagnosis not present

## 2014-10-02 DIAGNOSIS — E785 Hyperlipidemia, unspecified: Secondary | ICD-10-CM | POA: Diagnosis not present

## 2014-10-02 DIAGNOSIS — I10 Essential (primary) hypertension: Secondary | ICD-10-CM | POA: Diagnosis not present

## 2014-10-03 DIAGNOSIS — I10 Essential (primary) hypertension: Secondary | ICD-10-CM | POA: Diagnosis not present

## 2014-10-03 DIAGNOSIS — E119 Type 2 diabetes mellitus without complications: Secondary | ICD-10-CM | POA: Diagnosis not present

## 2014-10-03 DIAGNOSIS — E785 Hyperlipidemia, unspecified: Secondary | ICD-10-CM | POA: Diagnosis not present

## 2014-10-03 DIAGNOSIS — I69354 Hemiplegia and hemiparesis following cerebral infarction affecting left non-dominant side: Secondary | ICD-10-CM | POA: Diagnosis not present

## 2014-10-04 DIAGNOSIS — I69354 Hemiplegia and hemiparesis following cerebral infarction affecting left non-dominant side: Secondary | ICD-10-CM | POA: Diagnosis not present

## 2014-10-04 DIAGNOSIS — E119 Type 2 diabetes mellitus without complications: Secondary | ICD-10-CM | POA: Diagnosis not present

## 2014-10-04 DIAGNOSIS — E785 Hyperlipidemia, unspecified: Secondary | ICD-10-CM | POA: Diagnosis not present

## 2014-10-04 DIAGNOSIS — I10 Essential (primary) hypertension: Secondary | ICD-10-CM | POA: Diagnosis not present

## 2014-10-05 DIAGNOSIS — I6789 Other cerebrovascular disease: Secondary | ICD-10-CM | POA: Diagnosis not present

## 2014-10-05 DIAGNOSIS — I69354 Hemiplegia and hemiparesis following cerebral infarction affecting left non-dominant side: Secondary | ICD-10-CM | POA: Diagnosis not present

## 2014-10-05 DIAGNOSIS — I10 Essential (primary) hypertension: Secondary | ICD-10-CM | POA: Diagnosis not present

## 2014-10-05 DIAGNOSIS — E785 Hyperlipidemia, unspecified: Secondary | ICD-10-CM | POA: Diagnosis not present

## 2014-10-05 DIAGNOSIS — D511 Vitamin B12 deficiency anemia due to selective vitamin B12 malabsorption with proteinuria: Secondary | ICD-10-CM | POA: Diagnosis not present

## 2014-10-05 DIAGNOSIS — E1165 Type 2 diabetes mellitus with hyperglycemia: Secondary | ICD-10-CM | POA: Diagnosis not present

## 2014-10-05 DIAGNOSIS — E119 Type 2 diabetes mellitus without complications: Secondary | ICD-10-CM | POA: Diagnosis not present

## 2014-10-05 DIAGNOSIS — M545 Low back pain: Secondary | ICD-10-CM | POA: Diagnosis not present

## 2014-10-08 DIAGNOSIS — E119 Type 2 diabetes mellitus without complications: Secondary | ICD-10-CM | POA: Diagnosis not present

## 2014-10-08 DIAGNOSIS — I69354 Hemiplegia and hemiparesis following cerebral infarction affecting left non-dominant side: Secondary | ICD-10-CM | POA: Diagnosis not present

## 2014-10-08 DIAGNOSIS — I10 Essential (primary) hypertension: Secondary | ICD-10-CM | POA: Diagnosis not present

## 2014-10-08 DIAGNOSIS — E785 Hyperlipidemia, unspecified: Secondary | ICD-10-CM | POA: Diagnosis not present

## 2014-10-15 DIAGNOSIS — E785 Hyperlipidemia, unspecified: Secondary | ICD-10-CM | POA: Diagnosis not present

## 2014-10-15 DIAGNOSIS — I10 Essential (primary) hypertension: Secondary | ICD-10-CM | POA: Diagnosis not present

## 2014-10-15 DIAGNOSIS — I69354 Hemiplegia and hemiparesis following cerebral infarction affecting left non-dominant side: Secondary | ICD-10-CM | POA: Diagnosis not present

## 2014-10-15 DIAGNOSIS — E119 Type 2 diabetes mellitus without complications: Secondary | ICD-10-CM | POA: Diagnosis not present

## 2014-10-22 DIAGNOSIS — I6789 Other cerebrovascular disease: Secondary | ICD-10-CM | POA: Diagnosis not present

## 2014-10-22 DIAGNOSIS — I69354 Hemiplegia and hemiparesis following cerebral infarction affecting left non-dominant side: Secondary | ICD-10-CM | POA: Diagnosis not present

## 2014-10-22 DIAGNOSIS — E119 Type 2 diabetes mellitus without complications: Secondary | ICD-10-CM | POA: Diagnosis not present

## 2014-10-22 DIAGNOSIS — E784 Other hyperlipidemia: Secondary | ICD-10-CM | POA: Diagnosis not present

## 2014-10-22 DIAGNOSIS — I10 Essential (primary) hypertension: Secondary | ICD-10-CM | POA: Diagnosis not present

## 2014-10-22 DIAGNOSIS — E039 Hypothyroidism, unspecified: Secondary | ICD-10-CM | POA: Diagnosis not present

## 2014-10-23 DIAGNOSIS — E119 Type 2 diabetes mellitus without complications: Secondary | ICD-10-CM | POA: Diagnosis not present

## 2014-10-23 DIAGNOSIS — E785 Hyperlipidemia, unspecified: Secondary | ICD-10-CM | POA: Diagnosis not present

## 2014-10-23 DIAGNOSIS — I69354 Hemiplegia and hemiparesis following cerebral infarction affecting left non-dominant side: Secondary | ICD-10-CM | POA: Diagnosis not present

## 2014-10-23 DIAGNOSIS — E890 Postprocedural hypothyroidism: Secondary | ICD-10-CM | POA: Diagnosis not present

## 2014-10-23 DIAGNOSIS — I10 Essential (primary) hypertension: Secondary | ICD-10-CM | POA: Diagnosis not present

## 2014-10-26 DIAGNOSIS — C73 Malignant neoplasm of thyroid gland: Secondary | ICD-10-CM | POA: Diagnosis not present

## 2014-10-26 DIAGNOSIS — E11329 Type 2 diabetes mellitus with mild nonproliferative diabetic retinopathy without macular edema: Secondary | ICD-10-CM | POA: Diagnosis not present

## 2014-10-26 DIAGNOSIS — E890 Postprocedural hypothyroidism: Secondary | ICD-10-CM | POA: Diagnosis not present

## 2014-10-29 DIAGNOSIS — N2889 Other specified disorders of kidney and ureter: Secondary | ICD-10-CM | POA: Diagnosis not present

## 2014-10-30 DIAGNOSIS — E785 Hyperlipidemia, unspecified: Secondary | ICD-10-CM | POA: Diagnosis not present

## 2014-10-30 DIAGNOSIS — E119 Type 2 diabetes mellitus without complications: Secondary | ICD-10-CM | POA: Diagnosis not present

## 2014-10-30 DIAGNOSIS — I69354 Hemiplegia and hemiparesis following cerebral infarction affecting left non-dominant side: Secondary | ICD-10-CM | POA: Diagnosis not present

## 2014-10-30 DIAGNOSIS — I10 Essential (primary) hypertension: Secondary | ICD-10-CM | POA: Diagnosis not present

## 2014-11-05 ENCOUNTER — Telehealth: Payer: Self-pay | Admitting: Neurology

## 2014-11-05 NOTE — Telephone Encounter (Signed)
Angie from [facility] is calling needing a order put in for pt to have PT and OT.  Please advise.

## 2014-11-06 ENCOUNTER — Other Ambulatory Visit: Payer: Self-pay | Admitting: Neurology

## 2014-11-06 DIAGNOSIS — E119 Type 2 diabetes mellitus without complications: Secondary | ICD-10-CM | POA: Diagnosis not present

## 2014-11-06 DIAGNOSIS — E785 Hyperlipidemia, unspecified: Secondary | ICD-10-CM | POA: Diagnosis not present

## 2014-11-06 DIAGNOSIS — I10 Essential (primary) hypertension: Secondary | ICD-10-CM | POA: Diagnosis not present

## 2014-11-06 DIAGNOSIS — I6300 Cerebral infarction due to thrombosis of unspecified precerebral artery: Secondary | ICD-10-CM

## 2014-11-06 DIAGNOSIS — I69354 Hemiplegia and hemiparesis following cerebral infarction affecting left non-dominant side: Secondary | ICD-10-CM | POA: Diagnosis not present

## 2014-11-06 NOTE — Telephone Encounter (Signed)
Order for PT & OT are requested.

## 2014-11-06 NOTE — Telephone Encounter (Signed)
Ok I will order it

## 2014-11-19 ENCOUNTER — Encounter: Payer: Self-pay | Admitting: Occupational Therapy

## 2014-11-19 ENCOUNTER — Ambulatory Visit: Payer: Medicare Other | Admitting: Occupational Therapy

## 2014-11-19 ENCOUNTER — Ambulatory Visit: Payer: Medicare Other | Attending: Neurology

## 2014-11-19 DIAGNOSIS — R29898 Other symptoms and signs involving the musculoskeletal system: Secondary | ICD-10-CM | POA: Diagnosis not present

## 2014-11-19 DIAGNOSIS — R269 Unspecified abnormalities of gait and mobility: Secondary | ICD-10-CM | POA: Diagnosis not present

## 2014-11-19 DIAGNOSIS — R0781 Pleurodynia: Secondary | ICD-10-CM | POA: Diagnosis not present

## 2014-11-19 DIAGNOSIS — R279 Unspecified lack of coordination: Secondary | ICD-10-CM | POA: Diagnosis not present

## 2014-11-19 DIAGNOSIS — G8190 Hemiplegia, unspecified affecting unspecified side: Secondary | ICD-10-CM | POA: Insufficient documentation

## 2014-11-19 DIAGNOSIS — G8194 Hemiplegia, unspecified affecting left nondominant side: Secondary | ICD-10-CM

## 2014-11-19 DIAGNOSIS — IMO0002 Reserved for concepts with insufficient information to code with codable children: Secondary | ICD-10-CM

## 2014-11-19 DIAGNOSIS — I6980 Unspecified sequelae of other cerebrovascular disease: Secondary | ICD-10-CM | POA: Insufficient documentation

## 2014-11-19 NOTE — Therapy (Signed)
[facility] [address] [place], [place], [address] Phone: [phone]   Fax:  [phone]  Occupational Therapy Evaluation  Patient Details  Name: Kaylee Mullins MRN: [id number] Date of Birth: August 06, 1936 Referring Provider:  Nolene Ebbs, MD  Encounter Date: 11/19/2014      OT End of Session - 11/19/14 1416    Visit Number 1   Number of Visits 17   Date for OT Re-Evaluation 01/17/15   Authorization Type UHC Medicare, G-code needed   Authorization Time Period 60 days   Authorization - Visit Number 1   Authorization - Number of Visits 10   OT Start Time 1316   OT Stop Time 1402   OT Time Calculation (min) 46 min   Activity Tolerance Patient tolerated treatment well   Behavior During Therapy [facility] for tasks assessed/performed      Past Medical History  Diagnosis Date  . Hypertension   . Hypothyroidism   . Diabetes mellitus     type !  . Cancer     thyroid cancer    Past Surgical History  Procedure Laterality Date  . Thyroidectomy  2005  . Abdominal hysterectomy    . Tonsillectomy    . Lumbar spine surgery    . Flexible sigmoidoscopy  03/29/2012    Procedure: FLEXIBLE SIGMOIDOSCOPY;  Surgeon: Jeryl Columbia, MD;  Location: [facility] ENDOSCOPY;  Service: Endoscopy;  Laterality: N/A;  fleet enema upon arrival  . Hot hemostasis  03/29/2012    Procedure: HOT HEMOSTASIS (ARGON PLASMA COAGULATION/BICAP);  Surgeon: Jeryl Columbia, MD;  Location: [facility] ENDOSCOPY;  Service: Endoscopy;  Laterality: N/A;    There were no vitals filed for this visit.  Visit Diagnosis:  Left hemiparesis  Lack of coordination due to stroke      Subjective Assessment - 11/19/14 1324    Subjective  "I feel like I needed a little more therapy"   Pertinent History hx of thyroid CA (2005), pt reports L kidney mass, hx of lumbar spine surgery   Patient Stated Goals improve strength   Currently in Pain? Yes   Pain Location --  L side, chest   Pain Orientation Left   Pain Descriptors / Indicators Tightness   Pain Onset More than a month ago   Pain Frequency Constant   Aggravating Factors  unknown    Pain Relieving Factors rubbing with Ephraim Hamburger   Effect of Pain on Daily Activities discomfort.--OT will monitor, but will only address with HEP/stretches to upper body prn due to location/nature.           [facility] OT Assessment - 11/19/14 0001    Assessment   Diagnosis CVA   Onset Date 09/11/14  then on 09/16/14   Assessment Pt reports 2 CVAs in 1/16 with L-sided weakness, hospitalized 2 days each CVA   Prior Therapy home therapies ended approx a week ago   Precautions   Precautions None   Restrictions   Weight Bearing Restrictions No   Balance Screen   Has the patient fallen in the past 6 months Yes   How many times? --  1 with CVA   Home  Environment   Family/patient expects to be discharged to: Private residence   Home Layout Two level   Lives With [place]   Level of Independence Independent with basic ADLs;Independent with homemaking with ambulation   ADL   ADL comments BADLs mod I   IADL   Shopping Takes  care of all shopping needs independently  dtr goes with her and did before   Prior Level of Function Light Housekeeping independent   Light Housekeeping Performs light daily tasks such as dishwashing, bed making  pt cleans bathroom and light activities, dtr does rest   Prior Level of Function Meal Prep independent   Meal Prep Able to complete simple warm meal prep  mod I    Community Mobility Drives own vehicle   Medication Management Is responsible for taking medication in correct dosages at correct time   Physiological scientist financial matters independently (budgets, writes checks, pays rent, bills goes to bank), collects and keeps track of income   Mobility   Mobility Status Independent   Mobility Status Comments uses single point cane in community   Written Expression   Dominant Hand  Right   Vision - History   Baseline Vision Wears glasses only for reading   Visual History Cataracts   Additional Comments will need cataracts surgery soon, scheduled to see eye doctor   Vision Assessment   Eye Alignment Within Functional Limits  reports mild blurriness since CVA (diplopia only initially)   Activity Tolerance   Activity Tolerance Endurance does not limit participation in activity   Cognition   Overall Cognitive Status Within Functional Limits for tasks assessed   Memory Impaired   Memory Impairment Decreased short term memory  of dates reported, writes things down   Decreased Short Term Memory --  reports mild changes   Awareness Appears intact   Observation/Other Assessments   Upper Extremity Functional Index  31.25%   Sensation   Light Touch Appears Intact   Additional Comments Pt reports L foot numbness,   Coordination   9 Hole Peg Test Right;Left   Right 9 Hole Peg Test 23.19   Left 9 Hole Peg Test 29.79   Box and Blocks R-49blocks, L-49blocks   Tremors none   Edema   Edema none noted   ROM / Strength   AROM / PROM / Strength AROM;Strength   AROM   Overall AROM  Deficits   Overall AROM Comments WFL except L shoulder flex 90* (R 105*), abduction 75* (R 125*), ER 75%   Strength   Overall Strength Comments WNL except L shoulder flexion/abduction 3/5, horizontal abduction/adduction 4/5, biceps/triceps 4+/5   Hand Function   Right Hand Grip (lbs) 46   Left Hand Grip (lbs) 15                           OT Short Term Goals - 11/19/14 1427    OT SHORT TERM GOAL #1   Title Pt will verbalize understanding of CVA risk factors and warning signs/symptoms.--STGs due 12/18/14   Time 4   Period Weeks   Status New   OT SHORT TERM GOAL #2   Title Pt will improve L grip strength by at least 6lbs for increased ease with lifting tasks.   Baseline 15lbs   Time 4   Period Weeks   Status New   OT SHORT TERM GOAL #3   Title Pt will demo at least  100 degrees L shoulder flex for functional reaching.   Baseline 90 degrees   Time 4   Period Weeks   Status New   OT SHORT TERM GOAL #4   Title Pt will demo at least 95 degrees L shoulder abduction for functional reaching/ADLs.   Baseline 75 degrees   Time 4  Period Weeks   Status New           OT Long Term Goals - 12-19-2014 1430    OT LONG TERM GOAL #1   Title Pt will be independent with HEP for LUE strength and coordination.--LTGs due 01/17/15   Time 8   Period Weeks   Status New   OT LONG TERM GOAL #2   Title Pt will improve L grip strength by at least 12lbs to assist with lifting tasks.   Baseline 15lbs   Time 8   Period Weeks   Status New   OT LONG TERM GOAL #3   Title Pt will be able to retrieve 2-3lb object using at least 100 degrees L shoulder flexion.   Time 8   Period Weeks   Status New   OT LONG TERM GOAL #4   Title Pt will demo at least 105 degrees L shoulder abduction for ADLs/styling hair.   Baseline 75 degrees   Time 8   Period Weeks   Status New   OT LONG TERM GOAL #5   Title Pt will improve LUE functional use as shown by scoring 25% or less on Upper Extremity Functional Scale.   Baseline 31.25%   Time 8   Period Weeks   Status New               Plan - 12/19/14 1417    Clinical Impression Statement Pt s/p 2 CVAs in January 2016 (1/26 and 09/16/14) with continued L hemiparesis.  Pt presents with decreased strength, mild decreased coordination affecting LUE functional use and ADL/IADL performance.   Pt will benefit from skilled therapeutic intervention in order to improve on the following deficits (Retired) Decreased coordination;Decreased range of motion;Decreased activity tolerance;Decreased strength;Decreased mobility;Impaired UE functional use;Impaired sensation   Rehab Potential Good   Clinical Impairments Affecting Rehab Potential none   OT Frequency 2x / week   OT Duration 8 weeks  +eval   OT Treatment/Interventions Self-care/ADL  training;Cryotherapy;Electrical Stimulation;Energy conservation;Manual Therapy;Passive range of motion;Cognitive remediation/compensation;Functional Mobility Training;Neuromuscular education;Therapeutic exercise;Ultrasound;Therapeutic activities;Therapeutic exercises;Moist Heat;Fluidtherapy;DME and/or AE instruction;Patient/family education;Splinting   Plan HEP for strength (cane/low-range theraband), CVA education   Consulted and Agree with Plan of Care Patient          G-Codes - Dec 19, 2014 1435    Functional Assessment Tool Used Upper Extremity Functional Scale 31.25%, L grip strength: 15lbs   Functional Limitation Carrying, moving and handling objects   Carrying, Moving and Handling Objects Current Status (J1914) At least 20 percent but less than 40 percent impaired, limited or restricted   Carrying, Moving and Handling Objects Goal Status (N8295) At least 1 percent but less than 20 percent impaired, limited or restricted      Problem List Patient Active Problem List   Diagnosis Date Noted  . Numbness   . Acute CVA (cerebrovascular accident) 09/16/2014  . Right renal mass 09/13/2014  . Cerebral infarction due to unspecified mechanism   . CVA (cerebral infarction) 09/11/2014  . Osteoarthritis 09/11/2014  . Postsurgical hypothyroidism 09/11/2014  . Uncontrolled hypertension 09/11/2014  . CVA (cerebral vascular accident) 09/11/2014  . Diabetes mellitus 04/20/2007  . Dyslipidemia 04/20/2007    [facility] 19-Dec-2014, 2:41 PM  [place] [address] [place], [place], [address] Phone: [phone]   Fax:  [place], OTR/L Dec 19, 2014 2:41 PM

## 2014-11-19 NOTE — Therapy (Signed)
[place] [address] [place], [place], [address] Phone: [phone]   Fax:  [phone]  Physical Therapy Evaluation  Patient Details  Name: Kaylee Mullins MRN: [id number] Date of Birth: 17-Aug-1936 Referring Provider:  Garvin Fila, MD  Encounter Date: 11/19/2014      PT End of Session - 11/19/14 1602    Visit Number 1   Number of Visits 17   Date for PT Re-Evaluation 01/18/15   Authorization Type UHC Medicare   Authorization Time Period 11/19/14-01/18/15   PT Start Time 1450   PT Stop Time 1532   PT Time Calculation (min) 42 min   Activity Tolerance Patient tolerated treatment well   Behavior During Therapy [facility] for tasks assessed/performed      Past Medical History  Diagnosis Date  . Hypertension   . Hypothyroidism   . Diabetes mellitus     type !  . Cancer     thyroid cancer    Past Surgical History  Procedure Laterality Date  . Thyroidectomy  2005  . Abdominal hysterectomy    . Tonsillectomy    . Lumbar spine surgery    . Flexible sigmoidoscopy  03/29/2012    Procedure: FLEXIBLE SIGMOIDOSCOPY;  Surgeon: Jeryl Columbia, MD;  Location: [facility] ENDOSCOPY;  Service: Endoscopy;  Laterality: N/A;  fleet enema upon arrival  . Hot hemostasis  03/29/2012    Procedure: HOT HEMOSTASIS (ARGON PLASMA COAGULATION/BICAP);  Surgeon: Jeryl Columbia, MD;  Location: [facility] ENDOSCOPY;  Service: Endoscopy;  Laterality: N/A;    There were no vitals filed for this visit.  Visit Diagnosis:  Abnormality of gait  Rib pain on left side  Weakness of left lower extremity      Subjective Assessment - 11/19/14 1455    Subjective Patient presents with PMH of thyroid cancer dx 2005.  Had stroke 1/26 and second 1/31 with left side weakness.  Had HHPT and has progressed to walking with a cane in the community and no device at home.   Patient Stated Goals To walk without cane and improve pain in side   Currently in Pain? Yes   Pain  Score 4    Pain Location Rib cage   Pain Orientation Left   Pain Descriptors / Indicators Tightness   Pain Onset More than a month ago   Pain Frequency Constant   Aggravating Factors  worse sitting still due to having to push through the left foot    Pain Relieving Factors stretching and massage            [facility] PT Assessment - 11/19/14 1458    Assessment   Medical Diagnosis CVA   Onset Date 09/11/14   Next MD Visit 12/08/14  Dr. Leonie Man   Prior Therapy HHPT   Balance Screen   Has the patient fallen in the past 6 months Yes   How many times? 1  tripped over left foot when first home from hospital   Has the patient had a decrease in activity level because of a fear of falling?  No   Is the patient reluctant to leave their home because of a fear of falling?  No   Home Environment   Living Enviornment Private residence   Living Arrangements Alone   Available Help at Discharge Family  son comes 2 x a week and calls several times a day   Type of [place] Access Level entry   [place] Two level;Bed/bath  upstairs   Alternate Level Stairs-Number of Steps flight   Alternate Level Stairs-Rails Right  left half way    Home Equipment Walker - 2 wheels;Cane - single point;Bedside commode;Shower seat;Shower seat - built in   Prior Function   Level of Independence Independent with basic ADLs;Independent with homemaking with ambulation   Cognition   Memory Impaired   Memory Impairment Decreased short term memory   Decreased Short Term Memory --  mild changes and writes things down   Observation/Other Assessments   Focus on Therapeutic Outcomes (FOTO)  Intake 40% limitation 19% predicted   Sensation   Light Touch Impaired by gross assessment   Proprioception Appears Intact   Additional Comments L LE numbness especially on plantar surface, lateral aspect of dorsum of foot   Coordination   Gross Motor Movements are Fluid and Coordinated Yes   Posture/Postural Control    Posture/Postural Control Postural limitations   Postural Limitations Rounded Shoulders;Forward head;Increased thoracic kyphosis;Posterior pelvic tilt   ROM / Strength   AROM / PROM / Strength Strength   AROM   Overall AROM  Deficits   Strength   Strength Assessment Site Hip;Knee   Right/Left Hip Left   Left Hip Flexion 4-/5   Right/Left Knee Left   Left Knee Flexion 3+/5   Left Knee Extension 4+/5   Palpation   Palpation Tightness to palpation left lateral trunk musculature above iliac crest to 10th rib   Ambulation/Gait   Ambulation/Gait Yes   Ambulation/Gait Assistance 6: Modified independent (Device/Increase time)   Ambulation/Gait Assistance Details adjusted patient's cane to appropriate height    Ambulation Distance (Feet) 200 Feet  and 120'   Assistive device Straight cane;None   Gait Pattern Step-through pattern;Lateral hip instability;Trendelenburg   Ambulation Surface Level;Indoor   Gait velocity 3.04 ft/sec with cane  3.12 ft/sec without cane   Stairs Yes   Stairs Assistance 6: Modified independent (Device/Increase time);5: Supervision   Stairs Assistance Details (indicate cue type and reason) two rails able to alternate to ascend; one rail, uses two hands to one rail sidways; to descend uses step to pattern both with two rails and one rail leads with right versus left, but improved control leading down with left   Stair Management Technique One rail Right;Two rails;Alternating pattern;Step to pattern;Sideways;Forwards   Number of Stairs 8   Height of Stairs 6   Ramp 5: Supervision   Ramp Details (indicate cue type and reason) with cane for safety   Curb 5: Supervision   Curb Details (indicate cue type and reason) wtih cane, assist for safety   Balance   Balance Assessed Yes   Standardized Balance Assessment   Standardized Balance Assessment Berg Balance Test;Timed Up and Go Test   Berg Balance Test   Sit to Stand Able to stand  independently using hands   Standing  Unsupported Able to stand safely 2 minutes   Sitting with Back Unsupported but Feet Supported on Floor or Stool Able to sit safely and securely 2 minutes   Stand to Sit Sits safely with minimal use of hands   Transfers Able to transfer safely, minor use of hands   Standing Unsupported with Eyes Closed Able to stand 10 seconds safely   Standing Ubsupported with Feet Together Able to place feet together independently and stand 1 minute safely   From Standing, Reach Forward with Outstretched Arm Can reach confidently >25 cm (10")   From Standing Position, Pick up Object from Floor Able to pick up shoe  safely and easily   From Standing Position, Turn to Look Behind Over each Shoulder Looks behind from both sides and weight shifts well   Turn 360 Degrees Able to turn 360 degrees safely but slowly   Standing Unsupported, Alternately Place Feet on Step/Stool Able to stand independently and safely and complete 8 steps in 20 seconds   Standing Unsupported, One Foot in Front Able to plae foot ahead of the other independently and hold 30 seconds   Standing on One Leg Able to lift leg independently and hold 5-10 seconds   Total Score 51   Timed Up and Go Test   Normal TUG (seconds) 13.06   TUG Comments 13.93 with cane                           PT Education - 11/19/14 1602    Education provided Yes   Education Details PT POC   Person(s) Educated Patient   Methods Explanation   Comprehension Verbalized understanding          PT Short Term Goals - 11/19/14 1615    PT SHORT TERM GOAL #1   Title Patient will be independent in initial HEP for balance and LE strength.  12/21/14   Status New   PT SHORT TERM GOAL #2   Title Patient will demonstrate level indoor gait without cane negotiating obstacles and stairs without loss of balance independent.   12/21/14   Status New   PT SHORT TERM GOAL #3   Title Patient will complete DGI and set goal as appropriate.  12/21/14   Status New   PT  SHORT TERM GOAL #4   Title Patient will report pain left ribs no more than 3/10 at worst with sitting.  12/21/14   Status New           PT Long Term Goals - 11/19/14 1613    PT LONG TERM GOAL #1   Title Patient will be independent with HEP for left LE and postural strength, balance and trunk flexibility.  01/18/15   Status New   PT LONG TERM GOAL #2   Title Patient will verbalize fall prevention techiques for the home independent.  01/18/15   Status New   PT LONG TERM GOAL #3   Title Patient will report pain left side no more than 2/10 with sitting.  01/18/15   Status New   PT LONG TERM GOAL #4   Title Patient will demonstrate decreased fall risk with TUG in less than 13 seconds.  01/18/15   Status New   PT LONG TERM GOAL #5   Title Patient will ambulate paved outdoor surfaces without device independent no loss of balance x 1000' for improved community mobility.  01/18/15               Plan - 11/19/14 1605    Clinical Impression Statement Patient with h/o CVA x 2 and left sided weakness presents with deficits in strength, sensation on left LE and painful left ribs due to compensations and sensory deficits.  She also demonstrates weakness in postural stabilizers of the spine.  She will benefit from skilled PT to address issues and maximize independence and safety with mobility.   Pt will benefit from skilled therapeutic intervention in order to improve on the following deficits Abnormal gait;Impaired sensation;Postural dysfunction;Decreased balance;Pain;Decreased mobility;Decreased strength   Rehab Potential Good   PT Frequency 2x / week   PT Duration 8 weeks  PT Treatment/Interventions ADLs/Self Care Home Management;Moist Heat;Therapeutic activities;DME Instruction;Therapeutic exercise;Gait training;Stair training;Functional mobility training;Neuromuscular re-education;Manual techniques;Balance training;Patient/family education   PT Next Visit Plan DGI, hip abductor strength testing,  HEP for left LE strength, left trunk flexibility (stretching to Quadratus)   PT Home Exercise Plan left LE strength, left trunk flexibility (stretching to Quadratus)   Consulted and Agree with Plan of Care Patient          G-Codes - 13-Dec-2014 1625    Functional Assessment Tool Used FOTO    Functional Limitation Mobility: Walking and moving around   Mobility: Walking and Moving Around Current Status [phone]) At least 40 percent but less than 60 percent impaired, limited or restricted   Mobility: Walking and Moving Around Goal Status [phone]) At least 1 percent but less than 20 percent impaired, limited or restricted       Problem List Patient Active Problem List   Diagnosis Date Noted  . Numbness   . Acute CVA (cerebrovascular accident) 09/16/2014  . Right renal mass 09/13/2014  . Cerebral infarction due to unspecified mechanism   . CVA (cerebral infarction) 09/11/2014  . Osteoarthritis 09/11/2014  . Postsurgical hypothyroidism 09/11/2014  . Uncontrolled hypertension 09/11/2014  . CVA (cerebral vascular accident) 09/11/2014  . Diabetes mellitus 04/20/2007  . Dyslipidemia 04/20/2007    Raschelle Wisenbaker,CYNDI 2014-12-13, 4:32 PM  [place], [place] [address] [place], [place], [address] Phone: [phone]   Fax:  [phone]

## 2014-11-19 NOTE — Patient Instructions (Signed)
Educated in deficits noted on eval and PT plan of care.

## 2014-11-20 ENCOUNTER — Encounter: Payer: Self-pay | Admitting: Occupational Therapy

## 2014-11-20 ENCOUNTER — Ambulatory Visit: Payer: Medicare Other | Admitting: Occupational Therapy

## 2014-11-20 DIAGNOSIS — R279 Unspecified lack of coordination: Secondary | ICD-10-CM | POA: Diagnosis not present

## 2014-11-20 DIAGNOSIS — R269 Unspecified abnormalities of gait and mobility: Secondary | ICD-10-CM | POA: Diagnosis not present

## 2014-11-20 DIAGNOSIS — R0781 Pleurodynia: Secondary | ICD-10-CM | POA: Diagnosis not present

## 2014-11-20 DIAGNOSIS — IMO0002 Reserved for concepts with insufficient information to code with codable children: Secondary | ICD-10-CM

## 2014-11-20 DIAGNOSIS — G8190 Hemiplegia, unspecified affecting unspecified side: Secondary | ICD-10-CM | POA: Diagnosis not present

## 2014-11-20 DIAGNOSIS — I6980 Unspecified sequelae of other cerebrovascular disease: Secondary | ICD-10-CM | POA: Diagnosis not present

## 2014-11-20 DIAGNOSIS — R29898 Other symptoms and signs involving the musculoskeletal system: Secondary | ICD-10-CM | POA: Diagnosis not present

## 2014-11-20 DIAGNOSIS — G8194 Hemiplegia, unspecified affecting left nondominant side: Secondary | ICD-10-CM

## 2014-11-20 NOTE — Therapy (Signed)
[facility] [address] [place], [place], [address] Phone: [phone]   Fax:  [phone]  Occupational Therapy Treatment  Patient Details  Name: Kaylee Mullins MRN: [id number] Date of Birth: 1936/02/17 Referring Provider:  Nolene Ebbs, MD  Encounter Date: 11/20/2014      OT End of Session - 11/20/14 0939    Visit Number 2   Number of Visits 17   Date for OT Re-Evaluation 01/17/15   Authorization Type UHC Medicare, G-code needed   Authorization Time Period 60 days   Authorization - Visit Number 2   Authorization - Number of Visits 10   OT Start Time [phone]   OT Stop Time 1015   OT Time Calculation (min) 41 min   Activity Tolerance Patient tolerated treatment well   Behavior During Therapy [facility] for tasks assessed/performed      Past Medical History  Diagnosis Date  . Hypertension   . Hypothyroidism   . Diabetes mellitus     type !  . Cancer     thyroid cancer    Past Surgical History  Procedure Laterality Date  . Thyroidectomy  2005  . Abdominal hysterectomy    . Tonsillectomy    . Lumbar spine surgery    . Flexible sigmoidoscopy  03/29/2012    Procedure: FLEXIBLE SIGMOIDOSCOPY;  Surgeon: Jeryl Columbia, MD;  Location: [facility] ENDOSCOPY;  Service: Endoscopy;  Laterality: N/A;  fleet enema upon arrival  . Hot hemostasis  03/29/2012    Procedure: HOT HEMOSTASIS (ARGON PLASMA COAGULATION/BICAP);  Surgeon: Jeryl Columbia, MD;  Location: [facility] ENDOSCOPY;  Service: Endoscopy;  Laterality: N/A;    There were no vitals filed for this visit.  Visit Diagnosis:  Left hemiparesis  Lack of coordination due to stroke      Subjective Assessment - 11/20/14 0939    Subjective  doing ok   Currently in Pain? No/denies                    OT Treatments/Exercises (OP) - 11/20/14 0001    Neurological Re-education Exercises   Hand Gripper with Medium Beads Picking up blocks using 25lbs sustained grip strength  with min difficulty.   Reciprocal Movements Arm bike x5 min level 1 for conditioning with no rest and was able to maintain 40-46rpms with initial cueing.   Functional Reaching Activities   Mid Level reaching with LUE to place large pegs in vertical pegboard with 1 rest break x3 rows (30 pegs) with min cues to compensation.                OT Education - 11/20/14 [phone]    Education provided Yes   Education Details initial cane HEP (in sitting, standing, supine), CVA symptoms and risk factors   Person(s) Educated Patient   Methods Explanation;Demonstration;Handout   Comprehension Verbalized understanding;Returned demonstration;Verbal cues required          OT Short Term Goals - 11/20/14 1000    OT SHORT TERM GOAL #1   Title Pt will verbalize understanding of CVA risk factors and warning signs/symptoms.--STGs due 12/18/14   Time 4   Period Weeks   Status Achieved  11/20/14:  pt able to verbalize   OT SHORT TERM GOAL #2   Title Pt will improve L grip strength by at least 6lbs for increased ease with lifting tasks.   Baseline 15lbs   Time 4   Period Weeks   Status New   OT SHORT TERM  GOAL #3   Title Pt will demo at least 100 degrees L shoulder flex for functional reaching.   Baseline 90 degrees   Time 4   Period Weeks   Status New   OT SHORT TERM GOAL #4   Title Pt will demo at least 95 degrees L shoulder abduction for functional reaching/ADLs.   Baseline 75 degrees   Time 4   Period Weeks   Status New           OT Long Term Goals - 11/19/14 1430    OT LONG TERM GOAL #1   Title Pt will be independent with HEP for LUE strength and coordination.--LTGs due 01/17/15   Time 8   Period Weeks   Status New   OT LONG TERM GOAL #2   Title Pt will improve L grip strength by at least 12lbs to assist with lifting tasks.   Baseline 15lbs   Time 8   Period Weeks   Status New   OT LONG TERM GOAL #3   Title Pt will be able to retrieve 2-3lb object using at least 100 degrees L  shoulder flexion.   Time 8   Period Weeks   Status New   OT LONG TERM GOAL #4   Title Pt will demo at least 105 degrees L shoulder abduction for ADLs/styling hair.   Baseline 75 degrees   Time 8   Period Weeks   Status New   OT LONG TERM GOAL #5   Title Pt will improve LUE functional use as shown by scoring 25% or less on Upper Extremity Functional Scale.   Baseline 31.25%   Time 8   Period Weeks   Status New               Plan - 11/20/14 1019    Clinical Impression Statement Pt demo min improved ROM with LUE reaching today, but fatigued quickly.   Plan putty HEP, continue LUE strengthening   OT Home Exercise Plan issued cane HEP, CVA education 11/20/14   Consulted and Agree with Plan of Care Patient        Problem List Patient Active Problem List   Diagnosis Date Noted  . Numbness   . Acute CVA (cerebrovascular accident) 09/16/2014  . Right renal mass 09/13/2014  . Cerebral infarction due to unspecified mechanism   . CVA (cerebral infarction) 09/11/2014  . Osteoarthritis 09/11/2014  . Postsurgical hypothyroidism 09/11/2014  . Uncontrolled hypertension 09/11/2014  . CVA (cerebral vascular accident) 09/11/2014  . Diabetes mellitus 04/20/2007  . Dyslipidemia 04/20/2007    [facility] 11/20/2014, 10:21 AM  [place] [address] [place], [place], [address] Phone: [phone]   Fax:  [place], OTR/L 11/20/2014 10:21 AM

## 2014-11-20 NOTE — Patient Instructions (Signed)
   Lie on back holding wand. Raise arms over head. Hold 5sec. Repeat 15 times per set.  Do 2 sessions per day.  Then repeat sitting up.   ROM: Abduction - Wand   Holding wand with left hand palm up, push wand directly out to side, leading with other hand palm down, until stretch is felt. Hold 5 seconds. Repeat 15 times per set. Do 2 sessions per day. (Lying down and then in sitting)   ROM: Extension - Wand (Standing)   Stand holding wand behind back. PALMS FACING FORWARD.  Raise arms as far as possible. Repeat 15 times per set.  Do 2 sessions

## 2014-11-22 ENCOUNTER — Encounter: Payer: Self-pay | Admitting: Physical Therapy

## 2014-11-22 ENCOUNTER — Ambulatory Visit: Payer: Medicare Other | Admitting: Physical Therapy

## 2014-11-22 DIAGNOSIS — I6980 Unspecified sequelae of other cerebrovascular disease: Secondary | ICD-10-CM | POA: Diagnosis not present

## 2014-11-22 DIAGNOSIS — G8190 Hemiplegia, unspecified affecting unspecified side: Secondary | ICD-10-CM | POA: Diagnosis not present

## 2014-11-22 DIAGNOSIS — R279 Unspecified lack of coordination: Secondary | ICD-10-CM | POA: Diagnosis not present

## 2014-11-22 DIAGNOSIS — R0781 Pleurodynia: Secondary | ICD-10-CM | POA: Diagnosis not present

## 2014-11-22 DIAGNOSIS — R269 Unspecified abnormalities of gait and mobility: Secondary | ICD-10-CM | POA: Diagnosis not present

## 2014-11-22 DIAGNOSIS — R29898 Other symptoms and signs involving the musculoskeletal system: Secondary | ICD-10-CM | POA: Diagnosis not present

## 2014-11-22 NOTE — Therapy (Signed)
[place] [address] [place], [place], [address] Phone: [phone]   Fax:  [phone]  Physical Therapy Treatment  Patient Details  Name: Kaylee Mullins MRN: [id number] Date of Birth: June 29, 1936 Referring Provider:  Nolene Ebbs, MD  Encounter Date: 11/22/2014      PT End of Session - 11/22/14 1645    Visit Number 2   Number of Visits 17   Date for PT Re-Evaluation 01/18/15   Authorization Type UHC Medicare   Authorization Time Period 11/19/14-01/18/15   PT Start Time 1019   PT Stop Time 1103   PT Time Calculation (min) 44 min   Activity Tolerance Patient tolerated treatment well   Behavior During Therapy [facility] for tasks assessed/performed      Past Medical History  Diagnosis Date  . Hypertension   . Hypothyroidism   . Diabetes mellitus     type !  . Cancer     thyroid cancer    Past Surgical History  Procedure Laterality Date  . Thyroidectomy  2005  . Abdominal hysterectomy    . Tonsillectomy    . Lumbar spine surgery    . Flexible sigmoidoscopy  03/29/2012    Procedure: FLEXIBLE SIGMOIDOSCOPY;  Surgeon: Jeryl Columbia, MD;  Location: [facility] ENDOSCOPY;  Service: Endoscopy;  Laterality: N/A;  fleet enema upon arrival  . Hot hemostasis  03/29/2012    Procedure: HOT HEMOSTASIS (ARGON PLASMA COAGULATION/BICAP);  Surgeon: Jeryl Columbia, MD;  [facility];  Service: Endoscopy;  Laterality: N/A;    There were no vitals filed for this visit.  Visit Diagnosis:  Weakness of left lower extremity  Abnormality of gait      Subjective Assessment - 11/22/14 1025    Subjective Pt denies falls or changes since last visit.   Patient Stated Goals To walk without cane and improve pain in side   Currently in Pain? No/denies            [facility] PT Assessment - 11/22/14 1648    Strength   Left Hip ABduction 3+/5                   [facility] Adult PT Treatment/Exercise - 11/22/14 1027    Dynamic  Gait Index   Level Surface Mild Impairment   Change in Gait Speed Mild Impairment   Gait with Horizontal Head Turns Mild Impairment   Gait with Vertical Head Turns Mild Impairment   Gait and Pivot Turn Normal   Step Over Obstacle Mild Impairment   Step Around Obstacles Normal   Steps Moderate Impairment   Total Score 17   Knee/Hip Exercises: Standing   Other Standing Knee Exercises bil hip extension and abduction at counter x 10   Knee/Hip Exercises: Supine   Bridges Both;10 reps   Other Supine Knee Exercises clam shell in supine hooklying with red band x 10   Knee/Hip Exercises: Sidelying   Hip ABduction Left;10 reps;Strengthening   Clams on R side for L LE with red theraband x 10                PT Education - 11/22/14 1645    Education provided Yes   Education Details HEP   Person(s) Educated Patient   Methods Explanation;Demonstration;Handout   Comprehension Verbalized understanding;Returned demonstration;Verbal cues required          PT Short Term Goals - 11/19/14 1615    PT SHORT TERM GOAL #1   Title Patient will be  independent in initial HEP for balance and LE strength.  12/21/14   Status New   PT SHORT TERM GOAL #2   Title Patient will demonstrate level indoor gait without cane negotiating obstacles and stairs without loss of balance independent.   12/21/14   Status New   PT SHORT TERM GOAL #3   Title Patient will complete DGI and set goal as appropriate.  12/21/14   Status New   PT SHORT TERM GOAL #4   Title Patient will report pain left ribs no more than 3/10 at worst with sitting.  12/21/14   Status New           PT Long Term Goals - 11/19/14 1613    PT LONG TERM GOAL #1   Title Patient will be independent with HEP for left LE and postural strength, balance and trunk flexibility.  01/18/15   Status New   PT LONG TERM GOAL #2   Title Patient will verbalize fall prevention techiques for the home independent.  01/18/15   Status New   PT LONG TERM GOAL #3    Title Patient will report pain left side no more than 2/10 with sitting.  01/18/15   Status New   PT LONG TERM GOAL #4   Title Patient will demonstrate decreased fall risk with TUG in less than 13 seconds.  01/18/15   Status New   PT LONG TERM GOAL #5   Title Patient will ambulate paved outdoor surfaces without device independent no loss of balance x 1000' for improved community mobility.  01/18/15               Plan - 11/22/14 1646    Clinical Impression Statement Pt continues with left sided weakness and trunk weakness.  Continue PT per POC.   Pt will benefit from skilled therapeutic intervention in order to improve on the following deficits Abnormal gait;Impaired sensation;Postural dysfunction;Decreased balance;Pain;Decreased mobility;Decreased strength   Rehab Potential Good   PT Frequency 2x / week   PT Duration 8 weeks   PT Treatment/Interventions ADLs/Self Care Home Management;Moist Heat;Therapeutic activities;DME Instruction;Therapeutic exercise;Gait training;Stair training;Functional mobility training;Neuromuscular re-education;Manual techniques;Balance training;Patient/family education   PT Next Visit Plan Review HEP and add/revise as needed.  Left trunk flexibility (stretch Quadratus)   Consulted and Agree with Plan of Care Patient        Problem List Patient Active Problem List   Diagnosis Date Noted  . Numbness   . Acute CVA (cerebrovascular accident) 09/16/2014  . Right renal mass 09/13/2014  . Cerebral infarction due to unspecified mechanism   . CVA (cerebral infarction) 09/11/2014  . Osteoarthritis 09/11/2014  . Postsurgical hypothyroidism 09/11/2014  . Uncontrolled hypertension 09/11/2014  . CVA (cerebral vascular accident) 09/11/2014  . Diabetes mellitus 04/20/2007  . Dyslipidemia 04/20/2007    Narda Bonds 11/22/2014, 4:54 PM  [place] [address] [place], [place],  [address] Phone: [phone]   Fax:  [place], [place] 11/22/2014 4:54 PM Phone: [phone] Fax: [phone]

## 2014-11-22 NOTE — Patient Instructions (Addendum)
Abduction: Clam (Eccentric) - Side-Lying   Place red band just above the knees.  Lie on right side with knees bent. Lift top knee, keeping feet together. Keep trunk steady. Slowly lower for 3-5 seconds. 10 reps per set, 2 sets per day.   Copyright  VHI. All rights reserved.   Abduction: Side Leg Lift (Eccentric) - Side-Lying   Lie on right side. Lift top leg slightly higher than shoulder level. Keep top leg straight with body, toes pointing forward. Slowly lower for 3-5 seconds. 10 reps per set, 2 sets per day.  Copyright  VHI. All rights reserved.   Hip Abduction / Adduction: with Knee Flexion (Supine)   Place red band around knees.  With both knees bent, gently lower left knee to side and return. Repeat 10 times per set. Do 2 sessions per day.  http://orth.exer.us/683   Copyright  VHI. All rights reserved.   Bridging   Slowly raise buttocks from floor, keeping stomach tight. Repeat 10 times per set. Do 2 sessions per day.  http://orth.exer.us/1097   Copyright  VHI. All rights reserved.   ABDUCTION: Standing (Active)   Stand, feet flat. Lift right leg out to side.  Complete 1 sets of 10 repetitions. Perform 2 sessions per day.  Hold on to counter for support. Repeat on other leg.  http://gtsc.exer.us/111   Copyright  VHI. All rights reserved.   HIP / KNEE: Extension - Standing   Squeeze glutes. Raise and lift leg backward. Keep knee straight or slightly bent. 10 reps per set, 2 sets per day.  Hold onto counter for support.  Repeat on other leg.  Copyright  VHI. All rights reserved.

## 2014-11-23 DIAGNOSIS — H2513 Age-related nuclear cataract, bilateral: Secondary | ICD-10-CM | POA: Diagnosis not present

## 2014-11-23 DIAGNOSIS — H3561 Retinal hemorrhage, right eye: Secondary | ICD-10-CM | POA: Diagnosis not present

## 2014-11-26 ENCOUNTER — Ambulatory Visit: Payer: Medicare Other | Admitting: Occupational Therapy

## 2014-11-26 ENCOUNTER — Ambulatory Visit: Payer: Medicare Other | Admitting: Physical Therapy

## 2014-11-28 ENCOUNTER — Ambulatory Visit: Payer: Medicare Other | Admitting: Occupational Therapy

## 2014-11-28 ENCOUNTER — Ambulatory Visit: Payer: Medicare Other | Admitting: Physical Therapy

## 2014-11-28 ENCOUNTER — Encounter: Payer: Self-pay | Admitting: Physical Therapy

## 2014-11-28 VITALS — BP 206/86 | HR 58

## 2014-11-28 DIAGNOSIS — I6980 Unspecified sequelae of other cerebrovascular disease: Secondary | ICD-10-CM | POA: Diagnosis not present

## 2014-11-28 DIAGNOSIS — R269 Unspecified abnormalities of gait and mobility: Secondary | ICD-10-CM | POA: Diagnosis not present

## 2014-11-28 DIAGNOSIS — R29898 Other symptoms and signs involving the musculoskeletal system: Secondary | ICD-10-CM | POA: Diagnosis not present

## 2014-11-28 DIAGNOSIS — G8190 Hemiplegia, unspecified affecting unspecified side: Secondary | ICD-10-CM | POA: Diagnosis not present

## 2014-11-28 DIAGNOSIS — R0781 Pleurodynia: Secondary | ICD-10-CM | POA: Diagnosis not present

## 2014-11-28 DIAGNOSIS — R279 Unspecified lack of coordination: Secondary | ICD-10-CM | POA: Diagnosis not present

## 2014-11-28 NOTE — Therapy (Signed)
[facility] [address] [place], [place], [address] Phone: [phone]   Fax:  [phone]  Physical Therapy Treatment  Patient Details  Name: Kaylee Mullins MRN: [id number] Date of Birth: 08-04-1936 Referring Provider:  Nolene Ebbs, MD  Encounter Date: 11/28/2014      PT End of Session - 11/28/14 1306    Visit Number 3   Number of Visits 17   Date for PT Re-Evaluation 01/18/15   Authorization Type UHC Medicare   Authorization Time Period 11/19/14-01/18/15   PT Start Time 0806   PT Stop Time 0844   PT Time Calculation (min) 38 min   Activity Tolerance Treatment limited secondary to medical complications (Comment)  Increased BP   Behavior During Therapy [facility] for tasks assessed/performed      Past Medical History  Diagnosis Date  . Hypertension   . Hypothyroidism   . Diabetes mellitus     type !  . Cancer     thyroid cancer    Past Surgical History  Procedure Laterality Date  . Thyroidectomy  2005  . Abdominal hysterectomy    . Tonsillectomy    . Lumbar spine surgery    . Flexible sigmoidoscopy  03/29/2012    Procedure: FLEXIBLE SIGMOIDOSCOPY;  Surgeon: Jeryl Columbia, MD;  Location: [facility] ENDOSCOPY;  Service: Endoscopy;  Laterality: N/A;  fleet enema upon arrival  . Hot hemostasis  03/29/2012    Procedure: HOT HEMOSTASIS (ARGON PLASMA COAGULATION/BICAP);  Surgeon: Jeryl Columbia, MD;  Location: [facility] ENDOSCOPY;  Service: Endoscopy;  Laterality: N/A;    Filed Vitals:   11/28/14 0837 11/28/14 0840  BP: 208/85 206/86  Pulse: 58     Visit Diagnosis:  Weakness of left lower extremity      Subjective Assessment - 11/28/14 0824    Subjective Had a virus on Monday but feels better now.  Denies falls or changes.  Did exercises only once because she left her sheet at her daughters house.  Later into treatment pt reported she had not taken her meds this am.   Patient Stated Goals To walk without cane and improve  pain in side   Currently in Pain? No/denies                       [facility] Adult PT Treatment/Exercise - 11/28/14 0827    Knee/Hip Exercises: Supine   Quad Sets --  3# weight   Short Arc Quad Sets Left;10 reps;Other (comment)  3# weight   Heel Slides Left;2 sets;10 reps   Hip Adduction Isometric Both;15 reps   Bridges Both;2 sets;10 reps   Straight Leg Raises Left;2 sets;10 reps;Other (comment)  2# weight   Other Supine Knee Exercises clam shell in supine hooklying with red band x 10   Knee/Hip Exercises: Sidelying   Hip ABduction Left;10 reps;Strengthening   Clams on R side for L LE with red theraband x 10                PT Education - 11/28/14 1305    Education provided Yes   Education Details Increased BP and to go home and take meds and monitor BP, Call 911 or MD if BP worsens or has headache/nausea   Person(s) Educated Patient   Methods Explanation   Comprehension Verbalized understanding          PT Short Term Goals - 11/19/14 1615    PT SHORT TERM GOAL #1   Title Patient will  be independent in initial HEP for balance and LE strength.  12/21/14   Status New   PT SHORT TERM GOAL #2   Title Patient will demonstrate level indoor gait without cane negotiating obstacles and stairs without loss of balance independent.   12/21/14   Status New   PT SHORT TERM GOAL #3   Title Patient will complete DGI and set goal as appropriate.  12/21/14   Status New   PT SHORT TERM GOAL #4   Title Patient will report pain left ribs no more than 3/10 at worst with sitting.  12/21/14   Status New           PT Long Term Goals - 11/19/14 1613    PT LONG TERM GOAL #1   Title Patient will be independent with HEP for left LE and postural strength, balance and trunk flexibility.  01/18/15   Status New   PT LONG TERM GOAL #2   Title Patient will verbalize fall prevention techiques for the home independent.  01/18/15   Status New   PT LONG TERM GOAL #3   Title Patient will  report pain left side no more than 2/10 with sitting.  01/18/15   Status New   PT LONG TERM GOAL #4   Title Patient will demonstrate decreased fall risk with TUG in less than 13 seconds.  01/18/15   Status New   PT LONG TERM GOAL #5   Title Patient will ambulate paved outdoor surfaces without device independent no loss of balance x 1000' for improved community mobility.  01/18/15               Plan - 11/28/14 1307    Clinical Impression Statement Pt reports not taking her BP meds this morning towards end of PT session.  BP 208/85.  Pt denies headache, numbness or nausea.  Cancelled OT treatment and instructed to take meds when home and monitor BP as well as to call MD if experiences any sign of CVA.Marland Kitchen  Pt agrees.   Pt will benefit from skilled therapeutic intervention in order to improve on the following deficits Abnormal gait;Impaired sensation;Postural dysfunction;Decreased balance;Pain;Decreased mobility;Decreased strength   Rehab Potential Good   PT Frequency 2x / week   PT Duration 8 weeks   PT Treatment/Interventions ADLs/Self Care Home Management;Moist Heat;Therapeutic activities;DME Instruction;Therapeutic exercise;Gait training;Stair training;Functional mobility training;Neuromuscular re-education;Manual techniques;Balance training;Patient/family education   PT Next Visit Plan Monitor BP.  Review standing exercises.  Continue strengthening.   Consulted and Agree with Plan of Care Patient        Problem List Patient Active Problem List   Diagnosis Date Noted  . Numbness   . Acute CVA (cerebrovascular accident) 09/16/2014  . Right renal mass 09/13/2014  . Cerebral infarction due to unspecified mechanism   . CVA (cerebral infarction) 09/11/2014  . Osteoarthritis 09/11/2014  . Postsurgical hypothyroidism 09/11/2014  . Uncontrolled hypertension 09/11/2014  . CVA (cerebral vascular accident) 09/11/2014  . Diabetes mellitus 04/20/2007  . Dyslipidemia 04/20/2007    Narda Bonds 11/28/2014, 1:10 PM  [place] [address] [place], [place], [address] Phone: [phone]   Fax:  [place], [place] 11/28/2014 1:10 PM Phone: [phone] Fax: [phone]

## 2014-12-03 DIAGNOSIS — E1165 Type 2 diabetes mellitus with hyperglycemia: Secondary | ICD-10-CM | POA: Diagnosis not present

## 2014-12-03 DIAGNOSIS — E784 Other hyperlipidemia: Secondary | ICD-10-CM | POA: Diagnosis not present

## 2014-12-03 DIAGNOSIS — I6789 Other cerebrovascular disease: Secondary | ICD-10-CM | POA: Diagnosis not present

## 2014-12-03 DIAGNOSIS — K219 Gastro-esophageal reflux disease without esophagitis: Secondary | ICD-10-CM | POA: Diagnosis not present

## 2014-12-03 DIAGNOSIS — I10 Essential (primary) hypertension: Secondary | ICD-10-CM | POA: Diagnosis not present

## 2014-12-05 ENCOUNTER — Ambulatory Visit: Payer: Medicare Other | Admitting: Physical Therapy

## 2014-12-05 ENCOUNTER — Ambulatory Visit: Payer: Medicare Other | Admitting: Occupational Therapy

## 2014-12-06 ENCOUNTER — Telehealth: Payer: Self-pay | Admitting: Occupational Therapy

## 2014-12-06 ENCOUNTER — Ambulatory Visit: Payer: Medicare Other | Admitting: Occupational Therapy

## 2014-12-06 ENCOUNTER — Ambulatory Visit: Payer: Medicare Other | Admitting: Physical Therapy

## 2014-12-06 NOTE — Telephone Encounter (Signed)
Patient called regarding several cancelled therapy appointments due to sickness and high blood pressure. Pt reports she sees Dr. Leonie Man tomorrow. Pt encouraged to discuss HTN with MD and possibly adjusting medications. Pt instructed to call therapy on Monday if blood pressure is still high to cancel next weeks appointments and place pt on hold for therapy until blood pressure is consistently stable. Pt agreed

## 2014-12-11 ENCOUNTER — Ambulatory Visit: Payer: Medicare Other | Admitting: Occupational Therapy

## 2014-12-11 ENCOUNTER — Ambulatory Visit: Payer: Medicare Other | Admitting: Physical Therapy

## 2014-12-12 ENCOUNTER — Ambulatory Visit: Payer: Medicare Other | Admitting: Physical Therapy

## 2014-12-12 ENCOUNTER — Ambulatory Visit: Payer: Medicare Other | Admitting: Occupational Therapy

## 2014-12-13 ENCOUNTER — Encounter: Payer: Self-pay | Admitting: Neurology

## 2014-12-13 ENCOUNTER — Ambulatory Visit (INDEPENDENT_AMBULATORY_CARE_PROVIDER_SITE_OTHER): Payer: Medicare Other | Admitting: Neurology

## 2014-12-13 VITALS — BP 183/78 | HR 53 | Ht 64.0 in | Wt 230.6 lb

## 2014-12-13 DIAGNOSIS — M791 Myalgia, unspecified site: Secondary | ICD-10-CM | POA: Insufficient documentation

## 2014-12-13 DIAGNOSIS — M542 Cervicalgia: Secondary | ICD-10-CM | POA: Diagnosis not present

## 2014-12-13 NOTE — Patient Instructions (Signed)
I had a long d/w patient about her recent stroke, risk for recurrent stroke/TIAs, personally independently reviewed imaging studies and stroke evaluation results and answered questions.Continue Plavix  for secondary stroke prevention and maintain strict control of hypertension with blood pressure goal below 130/90, diabetes with hemoglobin A1c goal below 6.5% and lipids with LDL cholesterol goal below 100 mg/dL. I also advised the patient to eat a healthy diet with plenty of whole grains, cereals, fruits and vegetables, exercise regularly and maintain ideal body weight .I also advised her to do regular neck and back stretching exercises for her muscular pain. Followup in the future with Ward Givens, NP in 6 months  Stroke Prevention Some medical conditions and behaviors are associated with an increased chance of having a stroke. You may prevent a stroke by making healthy choices and managing medical conditions. HOW CAN I REDUCE MY RISK OF HAVING A STROKE?   Stay physically active. Get at least 30 minutes of activity on most or all days.  Do not smoke. It may also be helpful to avoid exposure to secondhand smoke.  Limit alcohol use. Moderate alcohol use is considered to be:  No more than 2 drinks per day for men.  No more than 1 drink per day for nonpregnant women.  Eat healthy foods. This involves:  Eating 5 or more servings of fruits and vegetables a day.  Making dietary changes that address high blood pressure (hypertension), high cholesterol, diabetes, or obesity.  Manage your cholesterol levels.  Making food choices that are high in fiber and low in saturated fat, trans fat, and cholesterol may control cholesterol levels.  Take any prescribed medicines to control cholesterol as directed by your health care provider.  Manage your diabetes.  Controlling your carbohydrate and sugar intake is recommended to manage diabetes.  Take any prescribed medicines to control diabetes as  directed by your health care provider.  Control your hypertension.  Making food choices that are low in salt (sodium), saturated fat, trans fat, and cholesterol is recommended to manage hypertension.  Take any prescribed medicines to control hypertension as directed by your health care provider.  Maintain a healthy weight.  Reducing calorie intake and making food choices that are low in sodium, saturated fat, trans fat, and cholesterol are recommended to manage weight.  Stop drug abuse.  Avoid taking birth control pills.  Talk to your health care provider about the risks of taking birth control pills if you are over 69 years old, smoke, get migraines, or have ever had a blood clot.  Get evaluated for sleep disorders (sleep apnea).  Talk to your health care provider about getting a sleep evaluation if you snore a lot or have excessive sleepiness.  Take medicines only as directed by your health care provider.  For some people, aspirin or blood thinners (anticoagulants) are helpful in reducing the risk of forming abnormal blood clots that can lead to stroke. If you have the irregular heart rhythm of atrial fibrillation, you should be on a blood thinner unless there is a good reason you cannot take them.  Understand all your medicine instructions.  Make sure that other conditions (such as anemia or atherosclerosis) are addressed. SEEK IMMEDIATE MEDICAL CARE IF:   You have sudden weakness or numbness of the face, arm, or leg, especially on one side of the body.  Your face or eyelid droops to one side.  You have sudden confusion.  You have trouble speaking (aphasia) or understanding.  You have sudden trouble  seeing in one or both eyes.  You have sudden trouble walking.  You have dizziness.  You have a loss of balance or coordination.  You have a sudden, severe headache with no known cause.  You have new chest pain or an irregular heartbeat. Any of these symptoms may  represent a serious problem that is an emergency. Do not wait to see if the symptoms will go away. Get medical help at once. Call your local emergency services (911 in [place] Do not drive yourself to the hospital. Document Released: 09/10/2004 Document Revised: 12/18/2013 Document Reviewed: 02/03/2013 [facility] Patient Information 2015 [place], [place]. This information is not intended to replace advice given to you by your health care provider. Make sure you discuss any questions you have with your health care provider.

## 2014-12-13 NOTE — Progress Notes (Signed)
[facility] [address] [place]. [place] [address] [phone]       OFFICE FOLLOW-UP NOTE  [name] Date of Birth:  03-07-1936 Medical Record Number:  [id number]   HPI: 26 year African-American lady seen for first office follow-up visit today for hospital admission for stroke on 09/11/14. She presented with left-sided weakness and numbness and elevated blood pressure. She complained of dizziness and tingling as well.Gait was unsteady. She presented beyond time window for TPA. Initial CT scan of the head showed no acute findings. Subsequent MRI scan of the brain showed a right thalamic lacunar acute infarct. MRA of the brain showed no large vessel stenosis and carotid ultrasound was unremarkable. Total cholesterol 149 triglycerides 131 and LDL 73 mg percent. Hemoglobin A1c was elevated at 8.9. Urine drug screen was negative. Transthoracic echo showed normal ejection fraction. Carotid ultrasound showed no stenosis. She had mild left-sided weakness and left hemisensory loss which improved shortly after admission. She was started on Plavix for stroke prevention and blood pressure medications were increased for better control. She was found to have a incidental renal mass with plan for removal soon. She states she's done well since discharge she still has intermittent mild burning in the left hand but her strength on the left side is much better. She is currently doing outpatient therapy which is temporarily on hold due to elevated blood pressure. She has seen her endocrinologist Dr. Buddy Mullins who has increase her Humulin fasting sugars yet range in the 150-160 range. She has had not yet had kidney biopsy but the plan is to repeat her renal scan on May 2 and to the biopsy only if the lesion is increasing in size. She is tolerating Plavix well without bleeding or bruising. She complains of mild posterior neck pain on the left as well as flank pain.  ROS:   14 system review of  systems is positive for  weight gain, leg swelling, ringing in the ears, blurred vision, eye pain, increased thirst, joint pain and swelling, aching muscles, numbness, sleepiness, not enough sleep, decreased energy, disinterest in activities and all other systems negative  PMH:  Past Medical History  Diagnosis Date  . Hypertension   . Hypothyroidism   . Diabetes mellitus     type !  . Cancer     thyroid cancer  . Stroke 09/2014  . Spinal stenosis     Social History:  History   Social History  . Marital Status: Legally Separated    Spouse Name: N/A  . Number of Children: N/A  . Years of Education: N/A   Occupational History  . retired     Buyer, retail   Social History Main Topics  . Smoking status: Never Smoker   . Smokeless tobacco: Not on file  . Alcohol Use: No  . Drug Use: No  . Sexual Activity: Not on file   Other Topics Concern  . Not on file   Social History Narrative   Separated, retired, 3 children living, 2 children deceased   Caffeine use - soda every few days   Right handed    Medications:   Current Outpatient Prescriptions on File Prior to Visit  Medication Sig Dispense Refill  . clopidogrel (PLAVIX) 75 MG tablet Take 1 tablet (75 mg total) by mouth daily. 30 tablet 1  . diltiazem (TIAZAC) 360 MG 24 hr capsule Take 360 mg by mouth daily.    . insulin regular human CONCENTRATED (HUMULIN R) 500 UNIT/ML SOLN  injection Inject 100 Units into the skin 2 (two) times daily with a meal.    . metoprolol succinate (TOPROL-XL) 100 MG 24 hr tablet Take 200 mg by mouth daily. Take with or immediately following a meal.    . rosuvastatin (CRESTOR) 20 MG tablet Take 20 mg by mouth daily.    Marland Kitchen UNABLE TO FIND Outpatient Physical therapy 1 each 0  . UNABLE TO FIND Outpatient Occupational therapy 1 each 0   No current facility-administered medications on file prior to visit.    Allergies:   Allergies  Allergen Reactions  . Nyquil Multi-Symptom  [Pseudoeph-Doxylamine-Dm-Apap] Other (See Comments)    Makes pt not "feel right in her head"  . Darvon [Propoxyphene Hcl]     hallucinations    Physical Exam General: Mildly obese elderly African-American lady, seated, in no evident distress Head: head normocephalic and atraumatic.  Neck: supple with no carotid or supraclavicular bruits Cardiovascular: regular rate and rhythm, no murmurs Musculoskeletal: no deformity Skin:  no rash/petichiae Vascular:  Normal pulses all extremities Filed Vitals:   12/13/14 0950  BP: 183/78  Pulse:    Neurologic Exam Mental Status: Awake and fully alert. Oriented to place and time. Recent and remote memory intact. Attention span, concentration and fund of knowledge appropriate. Mood and affect appropriate.  Cranial Nerves: Fundoscopic exam reveals sharp disc margins. Pupils equal, briskly reactive to light. Extraocular movements full without nystagmus. Visual fields full to confrontation. Hearing intact. Facial sensation intact. Face, tongue, palate moves normally and symmetrically.  Motor: Normal bulk and tone. Normal strength in all tested extremity muscles. Sensory.: intact to touch ,pinprick .position and vibratory sensation. Mild subjective paresthesias left fingertips but no objective sensory loss Coordination: Rapid alternating movements normal in all extremities. Finger-to-nose and heel-to-shin performed accurately bilaterally. Gait and Station: Arises from chair without difficulty. Stance is normal. Gait demonstrates normal stride length and balance . Able to heel, toe and tandem walk without difficulty.  Reflexes: 1+ and symmetric except ankle jerks are depressed bilaterally. Toes downgoing.   NIHSS 0 Modified Rankin  1  ASSESSMENT: 38 year patient with right thalamic infarct in January 2016 secondary to small vessel disease with vascular risk factors of diabetes, hypertension, hyperlipidemia, obesity, age and sex    PLAN: I had a long  d/w patient about her recent stroke, risk for recurrent stroke/TIAs, personally independently reviewed imaging studies and stroke evaluation results and answered questions.Continue Plavix  for secondary stroke prevention and maintain strict control of hypertension with blood pressure goal below 130/90, diabetes with hemoglobin A1c goal below 6.5% and lipids with LDL cholesterol goal below 100 mg/dL. I also advised the patient to eat a healthy diet with plenty of whole grains, cereals, fruits and vegetables, exercise regularly and maintain ideal body weight .I also advised her to do regular neck and back stretching exercises for her muscular pain. Followup in the future with Ward Givens, NP in 6 months     Note: This document was prepared with digital dictation and possible smart phrase technology. Any transcriptional errors that result from this process are unintentional

## 2014-12-14 DIAGNOSIS — H3561 Retinal hemorrhage, right eye: Secondary | ICD-10-CM | POA: Diagnosis not present

## 2014-12-14 DIAGNOSIS — H43813 Vitreous degeneration, bilateral: Secondary | ICD-10-CM | POA: Diagnosis not present

## 2014-12-14 DIAGNOSIS — E11331 Type 2 diabetes mellitus with moderate nonproliferative diabetic retinopathy with macular edema: Secondary | ICD-10-CM | POA: Diagnosis not present

## 2014-12-14 DIAGNOSIS — H3582 Retinal ischemia: Secondary | ICD-10-CM | POA: Diagnosis not present

## 2014-12-17 DIAGNOSIS — K862 Cyst of pancreas: Secondary | ICD-10-CM | POA: Diagnosis not present

## 2014-12-17 DIAGNOSIS — K760 Fatty (change of) liver, not elsewhere classified: Secondary | ICD-10-CM | POA: Diagnosis not present

## 2014-12-17 DIAGNOSIS — K449 Diaphragmatic hernia without obstruction or gangrene: Secondary | ICD-10-CM | POA: Diagnosis not present

## 2014-12-17 DIAGNOSIS — N2889 Other specified disorders of kidney and ureter: Secondary | ICD-10-CM | POA: Diagnosis not present

## 2014-12-18 ENCOUNTER — Encounter: Payer: Medicare Other | Admitting: Occupational Therapy

## 2014-12-18 ENCOUNTER — Ambulatory Visit: Payer: Medicare Other | Admitting: Physical Therapy

## 2014-12-20 ENCOUNTER — Telehealth: Payer: Self-pay | Admitting: Occupational Therapy

## 2014-12-20 ENCOUNTER — Ambulatory Visit: Payer: Medicare Other | Admitting: Occupational Therapy

## 2014-12-20 ENCOUNTER — Ambulatory Visit: Payer: Medicare Other | Attending: Neurology | Admitting: Physical Therapy

## 2014-12-20 DIAGNOSIS — G8190 Hemiplegia, unspecified affecting unspecified side: Secondary | ICD-10-CM | POA: Insufficient documentation

## 2014-12-20 DIAGNOSIS — I6980 Unspecified sequelae of other cerebrovascular disease: Secondary | ICD-10-CM | POA: Insufficient documentation

## 2014-12-20 DIAGNOSIS — R279 Unspecified lack of coordination: Secondary | ICD-10-CM | POA: Insufficient documentation

## 2014-12-20 DIAGNOSIS — R0781 Pleurodynia: Secondary | ICD-10-CM | POA: Insufficient documentation

## 2014-12-20 DIAGNOSIS — R29898 Other symptoms and signs involving the musculoskeletal system: Secondary | ICD-10-CM | POA: Insufficient documentation

## 2014-12-20 DIAGNOSIS — R269 Unspecified abnormalities of gait and mobility: Secondary | ICD-10-CM | POA: Insufficient documentation

## 2014-12-20 NOTE — Telephone Encounter (Signed)
Called pt due to missed appointments today.  Pt reports that she overslept and that BP is still running high.  Pt checked it this morning (187/87) and then 176/99 despite taking medications).  Pt reports that she is going through a lot of stress and medical issues right now.  Discussed discharge from therapy at this time as she has not been able to attend consistently and participate (last seen 11/28/14).   Recommended that pt document BP pressure and contact PCP.   Recommended that pt request new order from physician once BP in under control consistently.  Pt agreed.

## 2014-12-26 DIAGNOSIS — E11331 Type 2 diabetes mellitus with moderate nonproliferative diabetic retinopathy with macular edema: Secondary | ICD-10-CM | POA: Diagnosis not present

## 2014-12-26 DIAGNOSIS — N2889 Other specified disorders of kidney and ureter: Secondary | ICD-10-CM | POA: Diagnosis not present

## 2014-12-27 ENCOUNTER — Encounter: Payer: Self-pay | Admitting: Occupational Therapy

## 2014-12-27 NOTE — Therapy (Signed)
[facility] [address] [place], [place], [address] Phone: [phone]   Fax:  [phone]  Patient Details  Name: Kaylee Mullins MRN: [id number] Date of Birth: [birth year] Referring Provider:  No ref. provider found  Encounter Date: 12/27/2014  OCCUPATIONAL THERAPY DISCHARGE SUMMARY  Visits from Start of Care: 2  Current functional level related to goals / functional outcomes:      OT Short Term Goals - 11/20/14 1000    OT SHORT TERM GOAL #1   Title Pt will verbalize understanding of CVA risk factors and warning signs/symptoms.--STGs due 12/18/14   Time 4   Period Weeks   Status Achieved  11/20/14:  pt able to verbalize   OT SHORT TERM GOAL #2   Title Pt will improve L grip strength by at least 6lbs for increased ease with lifting tasks.   Baseline 15lbs   Time 4   Period Weeks   Status New   OT SHORT TERM GOAL #3   Title Pt will demo at least 100 degrees L shoulder flex for functional reaching.   Baseline 90 degrees   Time 4   Period Weeks   Status New   OT SHORT TERM GOAL #4   Title Pt will demo at least 95 degrees L shoulder abduction for functional reaching/ADLs.   Baseline 75 degrees   Time 4   Period Weeks   Status New          OT Long Term Goals - 11/19/14 1430    OT LONG TERM GOAL #1   Title Pt will be independent with HEP for LUE strength and coordination.--LTGs due 01/17/15   Time 8   Period Weeks   Status New   OT LONG TERM GOAL #2   Title Pt will improve L grip strength by at least 12lbs to assist with lifting tasks.   Baseline 15lbs   Time 8   Period Weeks   Status New   OT LONG TERM GOAL #3   Title Pt will be able to retrieve 2-3lb object using at least 100 degrees L shoulder flexion.   Time 8   Period Weeks   Status New   OT LONG TERM GOAL #4   Title Pt will demo at least 105 degrees L shoulder abduction for ADLs/styling hair.   Baseline 75 degrees   Time 8   Period Weeks   Status New   OT LONG TERM GOAL #5   Title Pt will improve LUE functional use as shown by scoring 25% or less on Upper Extremity Functional Scale.   Baseline 31.25%   Time 8   Period Weeks   Status New        Remaining deficits: Goals not met/unable to address/reassess due to change in medical status.  Continued decreased LUE strength, ROM, coordination, and functional use for ADLs.   Education / Equipment: Pt instructed in cane HEP and CVA education.  Pt verbalized understanding, but education not completed due to pt not returning to therapy.  Plan: Patient agrees to discharge.  Patient goals were not met. Patient is being discharged due to a change in medical status.  Pt has not been seen since 11/20/14 (>30 days) due to illness and high blood pressure.  Pt would benefit from returning to occupational therapy when BP is stable to allow increased participation in therapy.   ?????        [facility] 12/27/2014, 3:18 PM  [place]  [address] [place], [place], [address] Phone: [phone]   Fax:  [place], OTR/L 12/27/2014 3:18 PM

## 2015-01-16 DIAGNOSIS — E11331 Type 2 diabetes mellitus with moderate nonproliferative diabetic retinopathy with macular edema: Secondary | ICD-10-CM | POA: Diagnosis not present

## 2015-02-01 DIAGNOSIS — E784 Other hyperlipidemia: Secondary | ICD-10-CM | POA: Diagnosis not present

## 2015-02-01 DIAGNOSIS — I6789 Other cerebrovascular disease: Secondary | ICD-10-CM | POA: Diagnosis not present

## 2015-02-01 DIAGNOSIS — E039 Hypothyroidism, unspecified: Secondary | ICD-10-CM | POA: Diagnosis not present

## 2015-02-01 DIAGNOSIS — I10 Essential (primary) hypertension: Secondary | ICD-10-CM | POA: Diagnosis not present

## 2015-02-01 DIAGNOSIS — E119 Type 2 diabetes mellitus without complications: Secondary | ICD-10-CM | POA: Diagnosis not present

## 2015-02-15 DIAGNOSIS — C73 Malignant neoplasm of thyroid gland: Secondary | ICD-10-CM | POA: Diagnosis not present

## 2015-02-15 DIAGNOSIS — E11329 Type 2 diabetes mellitus with mild nonproliferative diabetic retinopathy without macular edema: Secondary | ICD-10-CM | POA: Diagnosis not present

## 2015-02-15 DIAGNOSIS — E890 Postprocedural hypothyroidism: Secondary | ICD-10-CM | POA: Diagnosis not present

## 2015-05-29 DIAGNOSIS — Z1389 Encounter for screening for other disorder: Secondary | ICD-10-CM | POA: Diagnosis not present

## 2015-05-29 DIAGNOSIS — E119 Type 2 diabetes mellitus without complications: Secondary | ICD-10-CM | POA: Diagnosis not present

## 2015-05-29 DIAGNOSIS — E784 Other hyperlipidemia: Secondary | ICD-10-CM | POA: Diagnosis not present

## 2015-05-29 DIAGNOSIS — D511 Vitamin B12 deficiency anemia due to selective vitamin B12 malabsorption with proteinuria: Secondary | ICD-10-CM | POA: Diagnosis not present

## 2015-05-29 DIAGNOSIS — I10 Essential (primary) hypertension: Secondary | ICD-10-CM | POA: Diagnosis not present

## 2015-05-29 DIAGNOSIS — E039 Hypothyroidism, unspecified: Secondary | ICD-10-CM | POA: Diagnosis not present

## 2015-05-29 DIAGNOSIS — I872 Venous insufficiency (chronic) (peripheral): Secondary | ICD-10-CM | POA: Diagnosis not present

## 2015-05-29 DIAGNOSIS — Z23 Encounter for immunization: Secondary | ICD-10-CM | POA: Diagnosis not present

## 2015-06-11 DIAGNOSIS — E890 Postprocedural hypothyroidism: Secondary | ICD-10-CM | POA: Diagnosis not present

## 2015-06-11 DIAGNOSIS — E113299 Type 2 diabetes mellitus with mild nonproliferative diabetic retinopathy without macular edema, unspecified eye: Secondary | ICD-10-CM | POA: Diagnosis not present

## 2015-06-11 DIAGNOSIS — C73 Malignant neoplasm of thyroid gland: Secondary | ICD-10-CM | POA: Diagnosis not present

## 2015-06-14 ENCOUNTER — Ambulatory Visit: Payer: Medicare Other | Admitting: Adult Health

## 2015-06-14 ENCOUNTER — Other Ambulatory Visit: Payer: Self-pay | Admitting: Urology

## 2015-06-14 DIAGNOSIS — N2889 Other specified disorders of kidney and ureter: Secondary | ICD-10-CM

## 2015-06-17 ENCOUNTER — Encounter: Payer: Self-pay | Admitting: Adult Health

## 2015-06-17 ENCOUNTER — Ambulatory Visit (INDEPENDENT_AMBULATORY_CARE_PROVIDER_SITE_OTHER): Payer: Medicare Other | Admitting: Adult Health

## 2015-06-17 VITALS — BP 140/62 | HR 64 | Resp 20 | Ht 64.0 in | Wt 247.0 lb

## 2015-06-17 DIAGNOSIS — Z8673 Personal history of transient ischemic attack (TIA), and cerebral infarction without residual deficits: Secondary | ICD-10-CM

## 2015-06-17 NOTE — Patient Instructions (Signed)
Continue Plavix for stroke prevention  Blood pressure goal less than 130/90  Cholesterol LDL less than 100  Hemoglobin A1c less than 6.5%  If you have any strokelike symptoms please call 911 immediately If your symptoms worsen or you develop new symptoms please let us know.

## 2015-06-17 NOTE — Progress Notes (Signed)
I reviewed above note and agree with the assessment and plan.  Rosalin Hawking, MD PhD Stroke Neurology 06/17/2015 12:40 PM

## 2015-06-17 NOTE — Progress Notes (Signed)
PATIENT: Kaylee Mullins DOB: 03-06-1936  REASON FOR VISIT: follow up-  History of stroke HISTORY FROM: patient  HISTORY OF PRESENT ILLNESS:  Kaylee Mullins is a 79 year old female with a history of stroke on Jerry 26 2016. She returns today for follow-up. She continues on Plavix for stroke prevention. She is tolerating this well. Patient's blood pressure has remained under relatively good control. She is on Crestor to manage her cholesterol. She is also on insulin for her diabetes. She reports that her latest hemoglobin A1c was 7% She denies any additional strokelike symptoms. She states that she has been doing her own exercises at home. She feels that the weakness that she did have on the left side has improved. She continues to have some sensory deficits in the left hand but states that  It has improved from her initial symptoms. She states that she is monitoring her diet however she does like to eat. She denies any new symptoms. She returns today for an evaluation.  HISTORY 12/13/14 (SETHI):[name] seen for first office follow-up visit today for hospital admission for stroke on 09/11/14. She presented with left-sided weakness and numbness and elevated blood pressure. She complained of dizziness and tingling as well.Gait was unsteady. She presented beyond time window for TPA. Initial CT scan of the head showed no acute findings. Subsequent MRI scan of the brain showed a right thalamic lacunar acute infarct. MRA of the brain showed no large vessel stenosis and carotid ultrasound was unremarkable. Total cholesterol 149 triglycerides 131 and LDL 73 mg percent. Hemoglobin A1c was elevated at 8.9. Urine drug screen was negative. Transthoracic echo showed normal ejection fraction. Carotid ultrasound showed no stenosis. She had mild left-sided weakness and left hemisensory loss which improved shortly after admission. She was started on Plavix for stroke prevention and blood pressure  medications were increased for better control. She was found to have a incidental renal mass with plan for removal soon. She states she's done well since discharge she still has intermittent mild burning in the left hand but her strength on the left side is much better. She is currently doing outpatient therapy which is temporarily on hold due to elevated blood pressure. She has seen her endocrinologist Dr. Buddy Duty who has increase her Humulin fasting sugars yet range in the 150-160 range. She has had not yet had kidney biopsy but the plan is to repeat her renal scan on May 2 and to the biopsy only if the lesion is increasing in size. She is tolerating Plavix well without bleeding or bruising. She complains of mild posterior neck pain on the left as well as flank pain.   REVIEW OF SYSTEMS: Out of a complete 14 system review of symptoms, the patient complains only of the following symptoms, and all other reviewed systems are negative.   eye itching, eye redness, double vision, eye pain, blurred vision, flushing, cold intolerance  ALLERGIES: Allergies  Allergen Reactions  . Nyquil Multi-Symptom [Pseudoeph-Doxylamine-Dm-Apap] Other (See Comments)    Makes pt not "feel right in her head"  . Darvon [Propoxyphene Hcl]     hallucinations    HOME MEDICATIONS: Outpatient Prescriptions Prior to Visit  Medication Sig Dispense Refill  . clopidogrel (PLAVIX) 75 MG tablet Take 1 tablet (75 mg total) by mouth daily. 30 tablet 1  . cyanocobalamin 1000 MCG tablet Take 100 mcg by mouth daily.    Marland Kitchen diltiazem (TIAZAC) 360 MG 24 hr capsule Take 360 mg by mouth daily.    Marland Kitchen  insulin regular human CONCENTRATED (HUMULIN R) 500 UNIT/ML SOLN injection Inject 100 Units into the skin 2 (two) times daily with a meal.    . levothyroxine (SYNTHROID, LEVOTHROID) 175 MCG tablet Take 175 mcg by mouth daily before breakfast.    . metoprolol succinate (TOPROL-XL) 100 MG 24 hr tablet Take 200 mg by mouth daily. Take with or  immediately following a meal.    . rosuvastatin (CRESTOR) 20 MG tablet Take 20 mg by mouth daily.    Marland Kitchen UNABLE TO FIND Outpatient Physical therapy 1 each 0  . UNABLE TO FIND Outpatient Occupational therapy 1 each 0  . telmisartan (MICARDIS) 80 MG tablet Take 80 mg by mouth daily.  2   No facility-administered medications prior to visit.    PAST MEDICAL HISTORY: Past Medical History  Diagnosis Date  . Hypertension   . Hypothyroidism   . Diabetes mellitus     type !  . Cancer ([place])     thyroid cancer  . Stroke ([place]) 09/2014  . Spinal stenosis     PAST SURGICAL HISTORY: Past Surgical History  Procedure Laterality Date  . Thyroidectomy  2005  . Abdominal hysterectomy      partial  . Tonsillectomy    . Lumbar spine surgery    . Flexible sigmoidoscopy  03/29/2012    Procedure: FLEXIBLE SIGMOIDOSCOPY;  Surgeon: Jeryl Columbia, MD;  Location: [facility] ENDOSCOPY;  Service: Endoscopy;  Laterality: N/A;  fleet enema upon arrival  . Hot hemostasis  03/29/2012    Procedure: HOT HEMOSTASIS (ARGON PLASMA COAGULATION/BICAP);  Surgeon: Jeryl Columbia, MD;  Location: [facility] ENDOSCOPY;  Service: Endoscopy;  Laterality: N/A;    FAMILY HISTORY: Family History  Problem Relation Age of Onset  . Heart failure Mother   . Heart attack Father     SOCIAL HISTORY: Social History   Social History  . Marital Status: Legally Separated    Spouse Name: N/A  . Number of Children: N/A  . Years of Education: N/A   Occupational History  . retired     Buyer, retail   Social History Main Topics  . Smoking status: Never Smoker   . Smokeless tobacco: Not on file  . Alcohol Use: No  . Drug Use: No  . Sexual Activity: Not on file   Other Topics Concern  . Not on file   Social History Narrative   Separated, retired, 3 children living, 2 children deceased   Caffeine use - soda every few days   Right handed      PHYSICAL EXAM  Filed Vitals:   06/17/15 1123  BP: 140/62  Pulse: 64  Resp: 20    Height: 5\' 4"  (1.626 m)  Weight: 247 lb (112.038 kg)   Body mass index is 42.38 kg/(m^2).  Generalized: Well developed, in no acute distress   Neurological examination  Mentation: Alert oriented to time, place, history taking. Follows all commands speech and language fluent Cranial nerve II-XII: Pupils were equal round reactive to light. Extraocular movements were full, visual field were full on confrontational test. Facial sensation and strength were normal. Uvula tongue midline. Head turning and shoulder shrug  were normal and symmetric. Motor: The motor testing reveals 5 over 5 strength of all 4 extremities. Good symmetric motor tone is noted throughout.  Sensory: Sensory testing is intact to soft touch on all 4 extremities except decreased in the left upper extremity. No evidence of extinction is noted.  Coordination: Cerebellar testing reveals good finger-nose-finger and heel-to-shin bilaterally.  Gait and station: Gait is normal. Tandem gait is  Slightly unsteady. Romberg is negative. No drift is seen.  Reflexes: Deep tendon reflexes are symmetric and normal bilaterally.   DIAGNOSTIC DATA (LABS, IMAGING, TESTING) - I reviewed patient records, labs, notes, testing and imaging myself where available.  Lab Results  Component Value Date   WBC 5.2 09/16/2014   HGB 12.8 09/16/2014   HCT 39.4 09/16/2014   MCV 93.4 09/16/2014   PLT 236 09/16/2014      Component Value Date/Time   NA 138 09/16/2014 1110   K 3.9 09/16/2014 1110   CL 105 09/16/2014 1110   CO2 24 09/16/2014 1110   GLUCOSE 230* 09/16/2014 1110   BUN 22 09/16/2014 1110   CREATININE 0.83 09/16/2014 1110   CALCIUM 8.9 09/16/2014 1110   PROT 7.1 09/16/2014 1110   ALBUMIN 3.6 09/16/2014 1110   AST 25 09/16/2014 1110   ALT 21 09/16/2014 1110   ALKPHOS 57 09/16/2014 1110   BILITOT 0.9 09/16/2014 1110   GFRNONAA 67* 09/16/2014 1110   GFRAA 77* 09/16/2014 1110   Lab Results  Component Value Date   CHOL 149 09/13/2014    HDL 50 09/13/2014   LDLCALC 73 09/13/2014   TRIG 131 09/13/2014   CHOLHDL 3.0 09/13/2014   Lab Results  Component Value Date   HGBA1C 8.9* 09/13/2014     ASSESSMENT AND PLAN 79 y.o. year old female  has a past medical history of Hypertension; Hypothyroidism; Diabetes mellitus; Cancer ([place]); Stroke [facility]) (09/2014); and Spinal stenosis. here with:   1. Stroke   Overall the patient is doing well. She will continue on Plavix for stroke prevention. She should maintain strict control of her blood pressure with goal less than 130/90. Her cholesterol LDL should be less than 100. And hemoglobin A1c less than 6.5. Patient advised that if she has any strokelike symptoms she should call 911 immediately.Continue to  Participate and light exercise and monitor the diet. She will follow-up our office in 6 months or sooner if needed.     Ward Givens, MSN, NP-C 06/17/2015, 11:33 AM [facility] [address] [place], [place] [address] [phone]

## 2015-06-21 DIAGNOSIS — E113313 Type 2 diabetes mellitus with moderate nonproliferative diabetic retinopathy with macular edema, bilateral: Secondary | ICD-10-CM | POA: Diagnosis not present

## 2015-06-28 DIAGNOSIS — I6789 Other cerebrovascular disease: Secondary | ICD-10-CM | POA: Diagnosis not present

## 2015-06-28 DIAGNOSIS — I10 Essential (primary) hypertension: Secondary | ICD-10-CM | POA: Diagnosis not present

## 2015-06-28 DIAGNOSIS — E1165 Type 2 diabetes mellitus with hyperglycemia: Secondary | ICD-10-CM | POA: Diagnosis not present

## 2015-06-28 DIAGNOSIS — E039 Hypothyroidism, unspecified: Secondary | ICD-10-CM | POA: Diagnosis not present

## 2015-06-28 DIAGNOSIS — E784 Other hyperlipidemia: Secondary | ICD-10-CM | POA: Diagnosis not present

## 2015-07-19 ENCOUNTER — Other Ambulatory Visit: Payer: Self-pay | Admitting: Urology

## 2015-07-19 ENCOUNTER — Observation Stay (HOSPITAL_COMMUNITY)
Admission: RE | Admit: 2015-07-19 | Discharge: 2015-07-19 | Disposition: A | Discharge status: Home / Self Care | Payer: Medicare Other | Source: Ambulatory Visit | Attending: Urology | Admitting: Urology

## 2015-07-19 DIAGNOSIS — N2889 Other specified disorders of kidney and ureter: Secondary | ICD-10-CM

## 2015-07-19 LAB — POCT I-STAT CREATININE: Creatinine, Ser: 0.8 mg/dL (ref 0.44–1.00)

## 2015-07-19 MED ORDER — GADOBENATE DIMEGLUMINE 529 MG/ML IV SOLN
20.0000 mL | Freq: Once | INTRAVENOUS | Status: AC | PRN
  Administered 2015-07-19: 20 mL via INTRAVENOUS

## 2015-07-22 DIAGNOSIS — D49511 Neoplasm of unspecified behavior of right kidney: Secondary | ICD-10-CM | POA: Diagnosis not present

## 2015-07-29 DIAGNOSIS — E890 Postprocedural hypothyroidism: Secondary | ICD-10-CM | POA: Diagnosis not present

## 2015-08-15 ENCOUNTER — Encounter: Payer: Self-pay | Admitting: Physical Therapy

## 2015-08-15 NOTE — Therapy (Signed)
[facility] [address] [place], [place], [address] Phone: [phone]   Fax:  [phone]  Patient Details  Name: Kaylee Mullins MRN: [id number] Date of Birth: 13-Nov-1935 Referring Provider:  No ref. provider found  Encounter Date: 08/15/2015  PHYSICAL THERAPY DISCHARGE SUMMARY  Visits from Start of Care: 3  Current functional level related to goals / functional outcomes: Goals not able to be fully addressed due to pt not returning to therapy due to additional medical issues.   Remaining deficits: -See initial eval-   Education / Equipment: BP monitoring  Plan: Patient agrees to discharge.  Patient goals were not met. Patient is being discharged due to not returning since the last visit.  ????? (Due to additional medical issues)   Frazier Butt., PT   Frazier Butt. 08/15/2015, 8:18 AM  [facility] [address] [place], [place], [address] Phone: [phone]   Fax:  [phone]

## 2015-09-17 ENCOUNTER — Other Ambulatory Visit: Payer: Self-pay | Admitting: Internal Medicine

## 2015-09-17 DIAGNOSIS — C73 Malignant neoplasm of thyroid gland: Secondary | ICD-10-CM

## 2015-09-19 ENCOUNTER — Encounter (HOSPITAL_COMMUNITY): Payer: Self-pay

## 2015-09-19 ENCOUNTER — Emergency Department (HOSPITAL_COMMUNITY)
Admission: EM | Admit: 2015-09-19 | Discharge: 2015-09-19 | Disposition: A | Discharge status: Home / Self Care | Payer: Medicare Other | Attending: Emergency Medicine | Admitting: Emergency Medicine

## 2015-09-19 DIAGNOSIS — E119 Type 2 diabetes mellitus without complications: Secondary | ICD-10-CM | POA: Diagnosis not present

## 2015-09-19 DIAGNOSIS — R109 Unspecified abdominal pain: Secondary | ICD-10-CM

## 2015-09-19 DIAGNOSIS — N12 Tubulo-interstitial nephritis, not specified as acute or chronic: Secondary | ICD-10-CM | POA: Diagnosis not present

## 2015-09-19 DIAGNOSIS — Z794 Long term (current) use of insulin: Secondary | ICD-10-CM | POA: Diagnosis not present

## 2015-09-19 DIAGNOSIS — Z85850 Personal history of malignant neoplasm of thyroid: Secondary | ICD-10-CM | POA: Insufficient documentation

## 2015-09-19 DIAGNOSIS — Z90710 Acquired absence of both cervix and uterus: Secondary | ICD-10-CM | POA: Diagnosis not present

## 2015-09-19 DIAGNOSIS — R600 Localized edema: Secondary | ICD-10-CM | POA: Diagnosis not present

## 2015-09-19 DIAGNOSIS — Z79899 Other long term (current) drug therapy: Secondary | ICD-10-CM | POA: Insufficient documentation

## 2015-09-19 DIAGNOSIS — Z7902 Long term (current) use of antithrombotics/antiplatelets: Secondary | ICD-10-CM | POA: Diagnosis not present

## 2015-09-19 DIAGNOSIS — Z8673 Personal history of transient ischemic attack (TIA), and cerebral infarction without residual deficits: Secondary | ICD-10-CM | POA: Insufficient documentation

## 2015-09-19 DIAGNOSIS — I10 Essential (primary) hypertension: Secondary | ICD-10-CM | POA: Diagnosis not present

## 2015-09-19 DIAGNOSIS — Z8739 Personal history of other diseases of the musculoskeletal system and connective tissue: Secondary | ICD-10-CM | POA: Insufficient documentation

## 2015-09-19 DIAGNOSIS — E039 Hypothyroidism, unspecified: Secondary | ICD-10-CM | POA: Insufficient documentation

## 2015-09-19 DIAGNOSIS — M549 Dorsalgia, unspecified: Secondary | ICD-10-CM | POA: Diagnosis present

## 2015-09-19 LAB — COMPREHENSIVE METABOLIC PANEL
ALT: 26 U/L (ref 14–54)
AST: 33 U/L (ref 15–41)
Albumin: 3.8 g/dL (ref 3.5–5.0)
Alkaline Phosphatase: 56 U/L (ref 38–126)
Anion gap: 13 (ref 5–15)
BUN: 13 mg/dL (ref 6–20)
CO2: 25 mmol/L (ref 22–32)
Calcium: 9.1 mg/dL (ref 8.9–10.3)
Chloride: 102 mmol/L (ref 101–111)
Creatinine, Ser: 0.81 mg/dL (ref 0.44–1.00)
GFR calc Af Amer: >60 mL/min (ref >60)
GFR calc non Af Amer: >60 mL/min (ref >60)
Glucose, Bld: 224 mg/dL — ABNORMAL HIGH (ref 65–99)
Potassium: 3.8 mmol/L (ref 3.5–5.1)
Sodium: 140 mmol/L (ref 135–145)
Total Bilirubin: 1 mg/dL (ref 0.3–1.2)
Total Protein: 7.5 g/dL (ref 6.5–8.1)

## 2015-09-19 LAB — URINALYSIS, ROUTINE W REFLEX MICROSCOPIC
Bilirubin Urine: NEGATIVE
Glucose, UA: NEGATIVE mg/dL
Ketones, ur: NEGATIVE mg/dL
Nitrite: POSITIVE — AB
Protein, ur: 100 mg/dL — AB
Specific Gravity, Urine: 1.022 (ref 1.005–1.030)
pH: 5 (ref 5.0–8.0)

## 2015-09-19 LAB — CBC
HCT: 40.1 % (ref 36.0–46.0)
Hemoglobin: 13.6 g/dL (ref 12.0–15.0)
MCH: 32.5 pg (ref 26.0–34.0)
MCHC: 33.9 g/dL (ref 30.0–36.0)
MCV: 95.7 fL (ref 78.0–100.0)
Platelets: 261 10*3/uL (ref 150–400)
RBC: 4.19 MIL/uL (ref 3.87–5.11)
RDW: 14.4 % (ref 11.5–15.5)
WBC: 6.7 10*3/uL (ref 4.0–10.5)

## 2015-09-19 LAB — GRAM STAIN: Special Requests: NORMAL

## 2015-09-19 LAB — URINE MICROSCOPIC-ADD ON

## 2015-09-19 LAB — CBG MONITORING, ED: Glucose-Capillary: 186 mg/dL — ABNORMAL HIGH (ref 65–99)

## 2015-09-19 LAB — LIPASE, BLOOD: Lipase: 48 U/L (ref 11–51)

## 2015-09-19 MED ORDER — OXYCODONE-ACETAMINOPHEN 5-325 MG PO TABS: 1.0000 | ORAL_TABLET | Freq: Three times a day (TID) | ORAL | Status: DC | PRN

## 2015-09-19 MED ORDER — ONDANSETRON HCL 4 MG PO TABS: 4.0000 mg | ORAL_TABLET | Freq: Three times a day (TID) | ORAL | Status: DC | PRN

## 2015-09-19 MED ORDER — HYDRALAZINE HCL 20 MG/ML IJ SOLN
10.0000 mg | Freq: Once | INTRAMUSCULAR | Status: AC
  Administered 2015-09-19: 10 mg via INTRAVENOUS
  Filled 2015-09-19: qty 1

## 2015-09-19 MED ORDER — CEFTRIAXONE SODIUM 1 G IJ SOLR
1.0000 g | Freq: Once | INTRAMUSCULAR | Status: AC
  Administered 2015-09-19: 1 g via INTRAVENOUS
  Filled 2015-09-19: qty 10

## 2015-09-19 MED ORDER — MORPHINE SULFATE (PF) 4 MG/ML IV SOLN
4.0000 mg | Freq: Once | INTRAVENOUS | Status: AC
  Administered 2015-09-19: 4 mg via INTRAVENOUS
  Filled 2015-09-19: qty 1

## 2015-09-19 MED ORDER — CEPHALEXIN 500 MG PO CAPS: 500.0000 mg | ORAL_CAPSULE | Freq: Three times a day (TID) | ORAL | Status: DC

## 2015-09-19 MED ORDER — CEPHALEXIN 500 MG PO CAPS: 500.0000 mg | ORAL_CAPSULE | Freq: Three times a day (TID) | ORAL | Status: AC

## 2015-09-19 NOTE — ED Notes (Signed)
Pt presents with 3 day h/o L flank pain that radiates around to L abdomen.  Pt reports pain has been constant since last night, reports nausea; pt denies any dysuria or diarrhea, reports small bowel movements, reports last good bowel movement was 1.5 weeks ago.

## 2015-09-19 NOTE — ED Provider Notes (Signed)
CSN: [id number]     Arrival date & time 09/19/15  1335 History   First MD Initiated Contact with Patient 09/19/15 1630     Chief Complaint  Patient presents with  . Back Pain     (Consider location/radiation/quality/duration/timing/severity/associated sxs/prior Treatment) Patient is a 80 y.o. female presenting with abdominal pain. The history is provided by the patient.  Abdominal Pain Pain location:  L flank Pain quality: throbbing   Pain radiates to:  Does not radiate Pain severity:  Severe Onset quality:  Gradual Duration:  3 days Timing:  Constant Progression:  Worsening Chronicity:  New Context: not previous surgeries, not recent illness and not trauma   Relieved by:  Nothing Worsened by:  Nothing tried Ineffective treatments:  None tried Associated symptoms: hematuria, nausea and vomiting   Associated symptoms: no chest pain, no cough, no fever, no hematemesis and no shortness of breath     Past Medical History  Diagnosis Date  . Hypertension   . Hypothyroidism   . Diabetes mellitus     type !  . Cancer ([place])     thyroid cancer  . Stroke ([place]) 09/2014  . Spinal stenosis    Past Surgical History  Procedure Laterality Date  . Thyroidectomy  2005  . Abdominal hysterectomy      partial  . Tonsillectomy    . Lumbar spine surgery    . Flexible sigmoidoscopy  03/29/2012    Procedure: FLEXIBLE SIGMOIDOSCOPY;  Surgeon: Jeryl Columbia, MD;  Location: [facility] ENDOSCOPY;  Service: Endoscopy;  Laterality: N/A;  fleet enema upon arrival  . Hot hemostasis  03/29/2012    Procedure: HOT HEMOSTASIS (ARGON PLASMA COAGULATION/BICAP);  Surgeon: Jeryl Columbia, MD;  Location: [facility] ENDOSCOPY;  Service: Endoscopy;  Laterality: N/A;   Family History  Problem Relation Age of Onset  . Heart failure Mother   . Heart attack Father    Social History  Substance Use Topics  . Smoking status: Never Smoker   . Smokeless tobacco: None  . Alcohol Use: No   OB History    No data available      Review of Systems  Constitutional: Negative for fever.  HENT: Negative.   Eyes: Negative for visual disturbance.  Respiratory: Negative for cough and shortness of breath.   Cardiovascular: Negative for chest pain.  Gastrointestinal: Positive for nausea, vomiting and abdominal pain. Negative for hematemesis.  Genitourinary: Positive for hematuria and decreased urine volume.  Musculoskeletal: Negative.   Skin: Negative.   Neurological: Negative.       Allergies  Nyquil multi-symptom and Darvon  Home Medications   Prior to Admission medications   Medication Sig Start Date End Date Taking? Authorizing Provider  cyanocobalamin 1000 MCG tablet Take 100 mcg by mouth daily.   Yes Historical Provider, MD  furosemide (LASIX) 40 MG tablet TAKE 1 TABLET BY MOUTH EVERY DAY AS NEEDED FOR LEG SWELLING 05/28/15  Yes Historical Provider, MD  insulin regular human CONCENTRATED (HUMULIN R) 500 UNIT/ML SOLN injection Inject 60-80 Units into the skin 2 (two) times daily with a meal. Take 80 units every morning  / 60 units every evening   Yes Historical Provider, MD  levothyroxine (SYNTHROID, LEVOTHROID) 150 MCG tablet Take 150 mcg by mouth daily. 08/27/15  Yes Historical Provider, MD  metoprolol succinate (TOPROL-XL) 100 MG 24 hr tablet Take 200 mg by mouth daily. Take with or immediately following a meal.   Yes Historical Provider, MD  rosuvastatin (CRESTOR) 20 MG tablet Take 20 mg  by mouth daily.   Yes Historical Provider, MD  telmisartan-hydrochlorothiazide (MICARDIS HCT) 40-12.5 MG tablet Take 1 tablet by mouth daily. 05/29/15  Yes Historical Provider, MD  cephALEXin (KEFLEX) 500 MG capsule Take 1 capsule (500 mg total) by mouth 3 (three) times daily. 09/19/15 [id number]  Delora Fuel, MD  clopidogrel (PLAVIX) 75 MG tablet Take 1 tablet (75 mg total) by mouth daily. 09/18/14   Costin Karlyne Greenspan, MD  [facility] NON-ASPIRIN EXTRA STRENGTH 500 MG tablet TAKE 1 TO 2 TABLETS BY MOUTH TWICE A DAY AS NEEDED PAIN 06/28/15    Historical Provider, MD  diltiazem (TIAZAC) 360 MG 24 hr capsule Take 360 mg by mouth daily. Reported on 09/19/2015    Historical Provider, MD  ondansetron (ZOFRAN) 4 MG tablet Take 1 tablet (4 mg total) by mouth every 8 (eight) hours as needed for nausea or vomiting. [id number]   Delora Fuel, MD  oxyCODONE-acetaminophen (PERCOCET/ROXICET) 5-325 MG tablet Take 1-2 tablets by mouth every 8 (eight) hours as needed for severe pain. [id number]   Delora Fuel, MD   BP [id number] mmHg  Pulse 82  Temp(Src) 98.5 F (36.9 C) (Oral)  Resp 18  Ht 5\' 4"  (1.626 m)  Wt 111.131 kg  BMI 42.03 kg/m2  SpO2 99% Physical Exam  Constitutional: She is oriented to person, place, and time. Vital signs are normal. She appears ill.  HENT:  Head: Normocephalic and atraumatic.  Eyes: Pupils are equal, round, and reactive to light. No scleral icterus.  Neck: Normal range of motion. Neck supple.  Cardiovascular: Normal rate, regular rhythm, normal heart sounds and intact distal pulses.   Pulmonary/Chest: Effort normal and breath sounds normal. She has no wheezes.  Abdominal: Soft. She exhibits no distension. There is tenderness (left cva). There is no rebound and no guarding.  Musculoskeletal: She exhibits edema (pitting edema that patient states is unchanged). She exhibits no tenderness.  Neurological: She is alert and oriented to person, place, and time. No cranial nerve deficit. She exhibits normal muscle tone. Coordination normal.  Skin: Skin is warm and dry. No rash noted. No erythema. No pallor.  Psychiatric: She has a normal mood and affect.  Nursing note and vitals reviewed.   ED Course  Procedures (including critical care time) Labs Review Labs Reviewed  COMPREHENSIVE METABOLIC PANEL - Abnormal; Notable for the following:    Glucose, Bld 224 (*)    All other components within normal limits  URINALYSIS, ROUTINE W REFLEX MICROSCOPIC (NOT AT [facility]) - Abnormal; Notable for the following:    APPearance CLOUDY (*)     Hgb urine dipstick TRACE (*)    Protein, ur 100 (*)    Nitrite POSITIVE (*)    Leukocytes, UA MODERATE (*)    All other components within normal limits  URINE MICROSCOPIC-ADD ON - Abnormal; Notable for the following:    Squamous Epithelial / LPF 0-5 (*)    Bacteria, UA MANY (*)    All other components within normal limits  CBG MONITORING, ED - Abnormal; Notable for the following:    Glucose-Capillary 186 (*)    All other components within normal limits  GRAM STAIN  URINE CULTURE  LIPASE, BLOOD  CBC    Imaging Review No results found. I have personally reviewed and evaluated these images and lab results as part of my medical decision-making.   EKG Interpretation None      MDM   Final diagnoses:  Pyelonephritis  Left flank pain    Patient is a 80 year old female  who presents with left-sided flank pain has been worsening over the past 3 days. She otherwise states that her urine has become dark in color and reports nausea without vomiting. She has not taken her blood pressure medicines for 2 days secondary to her nausea. Further history and exam as above notable for hypertension and left CVA tenderness. CBC without leukocytosis and UA significant for UTI. Renal function is within normal limits. Concern for pyelonephritis at this time. Patient given a dose of ceftriaxone here. Given a dose of hydralazine here.   I have reviewed all labs. Patient stable for discharge home.  I have reviewed all results with the patient. Advised to cont to take her bp meds as prescribed. Will rx antibiotics, zofran, and pain control. Advised to f/u with PCP in 3 days. Patient agrees to stated plan. All questions answered. Advised to call or return to have any questions, new symptoms, change in symptoms, or symptoms that they do not understand.    Heriberto Antigua, MD [id number]  David Glick, MD [id number]

## 2015-09-19 NOTE — ED Notes (Signed)
Pt requesting CBG check, reports not eating today.  CBG 186.  Pt asymptomatic at this time.

## 2015-09-19 NOTE — ED Notes (Signed)
MD at bedside. 

## 2015-09-19 NOTE — Discharge Instructions (Signed)
Pyelonephritis, Adult °Pyelonephritis is a kidney infection. The kidneys are organs that help clean your blood by moving waste out of your blood and into your pee (urine). This infection can happen quickly, or it can last for a long time. In most cases, it clears up with treatment and does not cause other problems. °HOME CARE °Medicines °· Take over-the-counter and prescription medicines only as told by your doctor. °· Take your antibiotic medicine as told by your doctor. Do not stop taking the medicine even if you start to feel better. °General Instructions °· Drink enough fluid to keep your pee clear or pale yellow. °· Avoid caffeine, tea, and carbonated drinks. °· Pee (urinate) often. Avoid holding in pee for long periods of time. °· Pee before and after sex. °· After pooping (having a bowel movement), women should wipe from front to back. Use each tissue only once. °· Keep all follow-up visits as told by your doctor. This is important. °GET HELP IF: °· You do not feel better after 2 days. °· Your symptoms get worse. °· You have a fever. °GET HELP RIGHT AWAY IF: °· You cannot take your medicine or drink fluids as told. °· You have chills and shaking. °· You throw up (vomit). °· You have very bad pain in your side (flank) or back. °· You feel very weak or you pass out (faint). °  °This information is not intended to replace advice given to you by your health care provider. Make sure you discuss any questions you have with your health care provider. °  °Document Released: 09/10/2004 Document Revised: 04/24/2015 Document Reviewed: 11/26/2014 °Elsevier Interactive Patient Education ©2016 [employer] ° °

## 2015-09-19 NOTE — ED Notes (Addendum)
Dr. Roxanne Mins notified on elevated blood pressure at discharge 201/91 , advised RN that pt. Can be discharge home . Result of gram stain reported to EDP.

## 2015-09-21 LAB — URINE CULTURE
Culture: >=100000
Special Requests: NORMAL

## 2015-09-22 ENCOUNTER — Telehealth (HOSPITAL_BASED_OUTPATIENT_CLINIC_OR_DEPARTMENT_OTHER): Payer: Self-pay | Admitting: Emergency Medicine

## 2015-09-22 NOTE — Telephone Encounter (Signed)
Post ED Visit - Positive Culture Follow-up  Culture report reviewed by antimicrobial stewardship pharmacist:  []  Elenor Quinones, Pharm.D. []  Heide Guile, Pharm.D., BCPS []  Parks Neptune, Pharm.D. []  Alycia Rossetti, Pharm.D., BCPS []  [place], Pharm.D., BCPS, AAHIVP []  Legrand Como, Pharm.D., BCPS, AAHIVP []  Milus Glazier, Pharm.D. []  Stephens November, Pharm.D.  Positive urine culture Klebsiella Treated with cephalexin organism sensitive to the same and no further patient follow-up is required at this time.  Hazle Nordmann 09/22/2015, 1:13 PM

## 2015-09-25 ENCOUNTER — Other Ambulatory Visit: Payer: Medicare Other

## 2015-10-04 ENCOUNTER — Ambulatory Visit
Admission: RE | Admit: 2015-10-04 | Discharge: 2015-10-04 | Disposition: A | Discharge status: Home / Self Care | Payer: Medicare Other | Source: Ambulatory Visit | Attending: Internal Medicine | Admitting: Internal Medicine

## 2015-10-04 DIAGNOSIS — C73 Malignant neoplasm of thyroid gland: Secondary | ICD-10-CM

## 2015-11-25 ENCOUNTER — Other Ambulatory Visit: Payer: Self-pay | Admitting: Internal Medicine

## 2015-11-25 DIAGNOSIS — E041 Nontoxic single thyroid nodule: Secondary | ICD-10-CM

## 2015-11-26 ENCOUNTER — Other Ambulatory Visit: Payer: Self-pay | Admitting: Internal Medicine

## 2015-11-26 DIAGNOSIS — C73 Malignant neoplasm of thyroid gland: Secondary | ICD-10-CM

## 2015-11-26 DIAGNOSIS — E041 Nontoxic single thyroid nodule: Secondary | ICD-10-CM

## 2015-12-03 ENCOUNTER — Other Ambulatory Visit: Payer: Self-pay | Admitting: Internal Medicine

## 2015-12-03 DIAGNOSIS — E041 Nontoxic single thyroid nodule: Secondary | ICD-10-CM

## 2015-12-03 DIAGNOSIS — C73 Malignant neoplasm of thyroid gland: Secondary | ICD-10-CM

## 2015-12-12 ENCOUNTER — Ambulatory Visit: Payer: Medicare Other | Admitting: Adult Health

## 2015-12-19 ENCOUNTER — Ambulatory Visit (INDEPENDENT_AMBULATORY_CARE_PROVIDER_SITE_OTHER): Payer: Medicare Other | Admitting: Adult Health

## 2015-12-19 ENCOUNTER — Encounter: Payer: Self-pay | Admitting: Adult Health

## 2015-12-19 VITALS — BP 186/82 | HR 54 | Ht 64.0 in | Wt 242.6 lb

## 2015-12-19 DIAGNOSIS — Z8673 Personal history of transient ischemic attack (TIA), and cerebral infarction without residual deficits: Secondary | ICD-10-CM | POA: Diagnosis not present

## 2015-12-19 DIAGNOSIS — I10 Essential (primary) hypertension: Secondary | ICD-10-CM

## 2015-12-19 NOTE — Patient Instructions (Addendum)
Continue Plavix Blood pressure <130/90 Cholesterol LDL <100 HgA1c <6.5 % Call PCP and make them aware of your Blood Pressure If your symptoms worsen or you develop new symptoms please let us know.

## 2015-12-19 NOTE — Progress Notes (Addendum)
PATIENT: Kaylee Mullins DOB: 1936/04/10  REASON FOR VISIT: follow up- history of cva HISTORY FROM: patient  HISTORY OF PRESENT ILLNESS: Kaylee Mullins is a 80 year old female with a history of stroke. She returns today for follow-up. She continues on Plavix and is tolerating it well. The patient's blood pressure is elevated today. She states that she took all of her medication except her Micardis. Her primary care continues to manage her cholesterol and diabetes. She is unsure what her recent hemoglobin A1c was. She states that she continues to do exercises at home to improve her mobility of the left arm. She states that she continues have some mild sensory changes in the left hand but it does not hinder her from using it. She states that she had spinal surgery in 1997 for stenosis. She states that she's been having recurrent back pain and followed up with Dr. Ellene Route. He advised that he may need to do some repair work- she has an MRI scheduled and will probably be undergoing surgery after the MRI. Patient advised that Plavix will need to be stopped prior to surgery. She returns today for an evaluation.  HISTORY 06/17/15 (MM): Kaylee Mullins is a 80 year old female with a history of stroke on Jerry 26 2016. She returns today for follow-up. She continues on Plavix for stroke prevention. She is tolerating this well. Patient's blood pressure has remained under relatively good control. She is on Crestor to manage her cholesterol. She is also on insulin for her diabetes. She reports that her latest hemoglobin A1c was 7% She denies any additional strokelike symptoms. She states that she has been doing her own exercises at home. She feels that the weakness that she did have on the left side has improved. She continues to have some sensory deficits in the left hand but states that It has improved from her initial symptoms. She states that she is monitoring her diet however she does like to eat. She denies any  new symptoms. She returns today for an evaluation.  HISTORY 12/13/14 (SETHI):Kaylee Mullins seen for first office follow-up visit today for hospital admission for stroke on 09/11/14. She presented with left-sided weakness and numbness and elevated blood pressure. She complained of dizziness and tingling as well.Gait was unsteady. She presented beyond time window for TPA. Initial CT scan of the head showed no acute findings. Subsequent MRI scan of the brain showed a right thalamic lacunar acute infarct. MRA of the brain showed no large vessel stenosis and carotid ultrasound was unremarkable. Total cholesterol 149 triglycerides 131 and LDL 73 mg percent. Hemoglobin A1c was elevated at 8.9. Urine drug screen was negative. Transthoracic echo showed normal ejection fraction. Carotid ultrasound showed no stenosis. She had mild left-sided weakness and left hemisensory loss which improved shortly after admission. She was started on Plavix for stroke prevention and blood pressure medications were increased for better control. She was found to have a incidental renal mass with plan for removal soon. She states she's done well since discharge she still has intermittent mild burning in the left hand but her strength on the left side is much better. She is currently doing outpatient therapy which is temporarily on hold due to elevated blood pressure. She has seen her endocrinologist Dr. Buddy Duty who has increase her Humulin fasting sugars yet range in the 150-160 range. She has had not yet had kidney biopsy but the plan is to repeat her renal scan on May 2 and to the biopsy only if  the lesion is increasing in size. She is tolerating Plavix well without bleeding or bruising. She complains of mild posterior neck pain on the left as well as flank pain.  REVIEW OF SYSTEMS: Out of a complete 14 system review of symptoms, the patient complains only of the following symptoms, and all other reviewed systems are  negative.  Ringing in ears, appetite change, leg swelling, constipation, daytime sleepiness, frequency of urination, urine decrease, joint pain, back pain, walking difficulty, depression, cold intolerance  ALLERGIES: Allergies  Allergen Reactions  . Nyquil Multi-Symptom [Pseudoeph-Doxylamine-Dm-Apap] Other (See Comments)    Makes pt not "feel right in her head"  . Darvon [Propoxyphene Hcl]     hallucinations    HOME MEDICATIONS: Outpatient Prescriptions Prior to Visit  Medication Sig Dispense Refill  . clopidogrel (PLAVIX) 75 MG tablet Take 1 tablet (75 mg total) by mouth daily. 30 tablet 1  . [facility] NON-ASPIRIN EXTRA STRENGTH 500 MG tablet TAKE 1 TO 2 TABLETS BY MOUTH TWICE A DAY AS NEEDED PAIN  5  . cyanocobalamin 1000 MCG tablet Take 100 mcg by mouth daily.    Marland Kitchen diltiazem (TIAZAC) 360 MG 24 hr capsule Take 360 mg by mouth daily. Reported on 09/19/2015    . furosemide (LASIX) 40 MG tablet TAKE 1 TABLET BY MOUTH EVERY DAY AS NEEDED FOR LEG SWELLING  5  . levothyroxine (SYNTHROID, LEVOTHROID) 150 MCG tablet Take 150 mcg by mouth daily.  5  . metoprolol succinate (TOPROL-XL) 100 MG 24 hr tablet Take 200 mg by mouth daily. Take with or immediately following a meal.    . oxyCODONE-acetaminophen (PERCOCET/ROXICET) 5-325 MG tablet Take 1-2 tablets by mouth every 8 (eight) hours as needed for severe pain. 6 tablet 0  . rosuvastatin (CRESTOR) 20 MG tablet Take 20 mg by mouth daily.    Marland Kitchen telmisartan-hydrochlorothiazide (MICARDIS HCT) 40-12.5 MG tablet Take 1 tablet by mouth daily.  5  . insulin regular human CONCENTRATED (HUMULIN R) 500 UNIT/ML SOLN injection Inject 60-80 Units into the skin 2 (two) times daily with a meal. Take 80 units every morning  / 60 units every evening    . ondansetron (ZOFRAN) 4 MG tablet Take 1 tablet (4 mg total) by mouth every 8 (eight) hours as needed for nausea or vomiting. (Patient not taking: Reported on 12/19/2015) 8 tablet 0   No facility-administered medications  prior to visit.    PAST MEDICAL HISTORY: Past Medical History  Diagnosis Date  . Hypertension   . Hypothyroidism   . Diabetes mellitus     type !  . Cancer ([place])     thyroid cancer  . Stroke ([place]) 09/2014  . Spinal stenosis     PAST SURGICAL HISTORY: Past Surgical History  Procedure Laterality Date  . Thyroidectomy  2005  . Abdominal hysterectomy      partial  . Tonsillectomy    . Lumbar spine surgery    . Flexible sigmoidoscopy  03/29/2012    Procedure: FLEXIBLE SIGMOIDOSCOPY;  Surgeon: Jeryl Columbia, MD;  Location[facility] ENDOSCOPY;  Service: Endoscopy;  Laterality: N/A;  fleet enema upon arrival  . Hot hemostasis  03/29/2012    Procedure: HOT HEMOSTASIS (ARGON PLASMA COAGULATION/BICAP);  Surgeon: Jeryl Columbia, MD;  Location: [facility] ENDOSCOPY;  Service: Endoscopy;  Laterality: N/A;  . Cataracts      Removed  11/2015  bilateral    FAMILY HISTORY: Family History  Problem Relation Age of Onset  . Heart failure Mother   . Heart attack Father  SOCIAL HISTORY: Social History   Social History  . Marital Status: Legally Separated    Spouse Name: N/A  . Number of Children: N/A  . Years of Education: N/A   Occupational History  . retired     Buyer, retail   Social History Main Topics  . Smoking status: Never Smoker   . Smokeless tobacco: Not on file  . Alcohol Use: No  . Drug Use: No  . Sexual Activity: Not on file   Other Topics Concern  . Not on file   Social History Narrative   Separated, retired, 3 children living, 2 children deceased   Caffeine use - soda every few days   Right handed   Pt lives alone.  Using cane when out and about.       PHYSICAL EXAM  Filed Vitals:   12/19/15 1042 12/19/15 1128 12/19/15 1203  BP: 180/77 203/80 186/82  Pulse: 55  54  Height: 5\' 4"  (1.626 m)    Weight: 242 lb 9.6 oz (110.043 kg)     Body mass index is 41.62 kg/(m^2).  Generalized: Well developed, in no acute distress   Neurological examination   Mentation: Alert oriented to time, place, history taking. Follows all commands speech and language fluent Cranial nerve II-XII: Pupils were equal round reactive to light. Extraocular movements were full, visual field were full on confrontational test. Facial sensation and strength were normal. Uvula tongue midline. Head turning and shoulder shrug  were normal and symmetric. Motor: The motor testing reveals 5 over 5 strength of all 4 extremities. Good symmetric motor tone is noted throughout.  Sensory: Sensory testing is intact to soft touch on all 4 extremities. No evidence of extinction is noted.  Coordination: Cerebellar testing reveals good finger-nose-finger and heel-to-shin bilaterally.  Gait and station: Gait is normal- Will use a cane for longer distances. Tandem gait Not attempted. Romberg is negative. No drift is seen.  Reflexes: Deep tendon reflexes are symmetric and normal bilaterally.   DIAGNOSTIC DATA (LABS, IMAGING, TESTING) - I reviewed patient records, labs, notes, testing and imaging myself where available.  Lab Results  Component Value Date   WBC 6.7 09/19/2015   HGB 13.6 09/19/2015   HCT 40.1 09/19/2015   MCV 95.7 09/19/2015   PLT 261 09/19/2015      Component Value Date/Time   NA 140 09/19/2015 1411   K 3.8 09/19/2015 1411   CL 102 09/19/2015 1411   CO2 25 09/19/2015 1411   GLUCOSE 224* 09/19/2015 1411   BUN 13 09/19/2015 1411   CREATININE 0.81 09/19/2015 1411   CALCIUM 9.1 09/19/2015 1411   PROT 7.5 09/19/2015 1411   ALBUMIN 3.8 09/19/2015 1411   AST 33 09/19/2015 1411   ALT 26 09/19/2015 1411   ALKPHOS 56 09/19/2015 1411   BILITOT 1.0 09/19/2015 1411   GFRNONAA >60 09/19/2015 1411   GFRAA >60 09/19/2015 1411   Lab Results  Component Value Date   CHOL 149 09/13/2014   HDL 50 09/13/2014   LDLCALC 73 09/13/2014   TRIG 131 09/13/2014   CHOLHDL 3.0 09/13/2014   Lab Results  Component Value Date   HGBA1C 8.9* 09/13/2014      ASSESSMENT AND  PLAN 80 y.o. year old female  has a past medical history of Hypertension; Hypothyroidism; Diabetes mellitus; Cancer ([place]); Stroke [facility]) (09/2014); and Spinal stenosis. here with:  1. History of stroke 2. Hypertension  The patient will remain on Plavix for stroke prevention. She should keep her blood pressure  less than 130/90. Cholesterol LDL less than 100 and hemoglobin A1c less than 6.5%. The patient's blood pressure remains elevated today. She did take her Micardis while in office. Patient advised that she should follow-up with her primary care provider regarding her blood pressure. Patient verbalized understanding. She will follow-up in 6 months or sooner if needed.     Ward Givens, MSN, NP-C 12/19/2015, 11:42 AM [facility] [address], [place], [place] [address] [phone]   I reviewed the above note and documentation by the Nurse Practitioner and agree with the history, physical exam, assessment and plan as outlined above. I was immediately available for face-to-face consultation. Star Age, MD, PhD [facility])

## 2016-01-09 ENCOUNTER — Other Ambulatory Visit: Payer: Self-pay | Admitting: Neurological Surgery

## 2016-01-10 NOTE — Progress Notes (Signed)
Clearance form fax to  [place] and last office notes to [phone]. Fax confirm by being receive.

## 2016-01-22 ENCOUNTER — Other Ambulatory Visit (HOSPITAL_COMMUNITY): Payer: Self-pay | Admitting: *Deleted

## 2016-01-22 ENCOUNTER — Encounter (HOSPITAL_COMMUNITY)
Admission: RE | Admit: 2016-01-22 | Discharge: 2016-01-22 | Disposition: A | Discharge status: Home / Self Care | Payer: Medicare Other | Source: Ambulatory Visit | Attending: Neurological Surgery | Admitting: Neurological Surgery

## 2016-01-22 ENCOUNTER — Encounter (HOSPITAL_COMMUNITY): Payer: Self-pay

## 2016-01-22 ENCOUNTER — Other Ambulatory Visit: Payer: Self-pay | Admitting: Neurological Surgery

## 2016-01-22 DIAGNOSIS — N2889 Other specified disorders of kidney and ureter: Secondary | ICD-10-CM | POA: Diagnosis not present

## 2016-01-22 DIAGNOSIS — I69354 Hemiplegia and hemiparesis following cerebral infarction affecting left non-dominant side: Secondary | ICD-10-CM | POA: Insufficient documentation

## 2016-01-22 DIAGNOSIS — I10 Essential (primary) hypertension: Secondary | ICD-10-CM | POA: Insufficient documentation

## 2016-01-22 DIAGNOSIS — Z7902 Long term (current) use of antithrombotics/antiplatelets: Secondary | ICD-10-CM | POA: Diagnosis not present

## 2016-01-22 DIAGNOSIS — E119 Type 2 diabetes mellitus without complications: Secondary | ICD-10-CM | POA: Diagnosis not present

## 2016-01-22 DIAGNOSIS — Z794 Long term (current) use of insulin: Secondary | ICD-10-CM | POA: Diagnosis not present

## 2016-01-22 DIAGNOSIS — E890 Postprocedural hypothyroidism: Secondary | ICD-10-CM | POA: Diagnosis not present

## 2016-01-22 DIAGNOSIS — Z79899 Other long term (current) drug therapy: Secondary | ICD-10-CM | POA: Diagnosis not present

## 2016-01-22 DIAGNOSIS — Z01812 Encounter for preprocedural laboratory examination: Secondary | ICD-10-CM | POA: Diagnosis not present

## 2016-01-22 DIAGNOSIS — Z85850 Personal history of malignant neoplasm of thyroid: Secondary | ICD-10-CM | POA: Insufficient documentation

## 2016-01-22 DIAGNOSIS — Z01818 Encounter for other preprocedural examination: Secondary | ICD-10-CM | POA: Insufficient documentation

## 2016-01-22 DIAGNOSIS — Z6841 Body Mass Index (BMI) 40.0 and over, adult: Secondary | ICD-10-CM | POA: Diagnosis not present

## 2016-01-22 HISTORY — DX: Chronic kidney disease, unspecified: N18.9

## 2016-01-22 HISTORY — DX: Presence of dental prosthetic device (complete) (partial): Z97.2

## 2016-01-22 HISTORY — DX: Unspecified osteoarthritis, unspecified site: M19.90

## 2016-01-22 HISTORY — DX: Complete loss of teeth, unspecified cause, unspecified class: K08.109

## 2016-01-22 LAB — CBC
HCT: 38.5 % (ref 36.0–46.0)
Hemoglobin: 12.3 g/dL (ref 12.0–15.0)
MCH: 30.3 pg (ref 26.0–34.0)
MCHC: 31.9 g/dL (ref 30.0–36.0)
MCV: 94.8 fL (ref 78.0–100.0)
Platelets: 211 10*3/uL (ref 150–400)
RBC: 4.06 MIL/uL (ref 3.87–5.11)
RDW: 13.7 % (ref 11.5–15.5)
WBC: 5.7 10*3/uL (ref 4.0–10.5)

## 2016-01-22 LAB — BASIC METABOLIC PANEL
Anion gap: 8 (ref 5–15)
BUN: 14 mg/dL (ref 6–20)
CO2: 26 mmol/L (ref 22–32)
Calcium: 9 mg/dL (ref 8.9–10.3)
Chloride: 107 mmol/L (ref 101–111)
Creatinine, Ser: 0.95 mg/dL (ref 0.44–1.00)
GFR calc Af Amer: >60 mL/min (ref >60)
GFR calc non Af Amer: 56 mL/min — ABNORMAL LOW (ref >60)
Glucose, Bld: 308 mg/dL — ABNORMAL HIGH (ref 65–99)
Potassium: 3.9 mmol/L (ref 3.5–5.1)
Sodium: 141 mmol/L (ref 135–145)

## 2016-01-22 LAB — SURGICAL PCR SCREEN
MRSA, PCR: NEGATIVE
Staphylococcus aureus: NEGATIVE

## 2016-01-22 NOTE — Pre-Procedure Instructions (Signed)
Kaylee Mullins  01/22/2016      [id number] [address], [place] [phone] [place] [address] Phone: [phone] Fax: [phone]    Your procedure is scheduled on Tuesday June 13th  Report to [facility] Admitting at 9:45 am  Call this number if you have problems the morning of surgery:  [phone]   Remember:  Do not eat food or drink liquids after midnight.  Take these medicines the morning of surgery with A SIP OF WATER Diltiazem, levothyroxine (synthroid), metoprolol succinate (toprol XL), Percocet (oxycodone) if needed Stop taking your Plavix as instructed by your doctor, stop taking aspirin or aspirin containing products, vitamins and herbal supplements, and Nsaids (such as aleve, motrin, ibuprofen, naproxen, advil)  How to Manage Your Diabetes Before and After Surgery  Why is it important to control my blood sugar before and after surgery? . Improving blood sugar levels before and after surgery helps healing and can limit problems. . A way of improving blood sugar control is eating a healthy diet by: o  Eating less sugar and carbohydrates o  Increasing activity/exercise o  Talking with your doctor about reaching your blood sugar goals . High blood sugars (greater than 180 mg/dL) can raise your risk of infections and slow your recovery, so you will need to focus on controlling your diabetes during the weeks before surgery. . Make sure that the doctor who takes care of your diabetes knows about your planned surgery including the date and location.  How do I manage my blood sugar before surgery? . Check your blood sugar at least 4 times a day, starting 2 days before surgery, to make sure that the level is not too high or low. o Check your blood sugar the morning of your surgery when you wake up and every 2 hours until you get to the Short Stay unit. . If your blood sugar is less than 70 mg/dL,  you will need to treat for low blood sugar: o Do not take insulin. o Treat a low blood sugar (less than 70 mg/dL) with  cup of clear juice (cranberry or apple), 4 glucose tablets, OR glucose gel. o Recheck blood sugar in 15 minutes after treatment (to make sure it is greater than 70 mg/dL). If your blood sugar is not greater than 70 mg/dL on recheck, call [phone] for further instructions. . Report your blood sugar to the short stay nurse when you get to Short Stay.  . If you are admitted to the hospital after surgery: o Your blood sugar will be checked by the staff and you will probably be given insulin after surgery (instead of oral diabetes medicines) to make sure you have good blood sugar levels. o The goal for blood sugar control after surgery is 80-180 mg/dL.   What to do about my diabetes medicines:   . THE NIGHT BEFORE SURGERY take 28 units (70%) of your Humalog 50/50     THE MORNING OF SURGERY do not take any of your insulin     Do not wear jewelry, make-up or nail polish.  Do not wear lotions, powders, or perfumes.  You may wear deodorant.  Do not shave 48 hours prior to surgery.    Do not bring valuables to the hospital.  [facility] is not responsible for any belongings or valuables.  Contacts, dentures or bridgework may not be worn into surgery.  Leave your  suitcase in the car.  After surgery it may be brought to your room.  For patients admitted to the hospital, discharge time will be determined by your treatment team.  Special instructions: Shower with CHG the night before and morning of your surgery as instructed  Please read over the following fact sheets that you were given. Coughing and Deep Breathing, MRSA Information and Surgical Site Infection Prevention, Shower instructions

## 2016-01-22 NOTE — Progress Notes (Addendum)
Pt does not see a cardiologist. Plavix is managed by her PCP Dr Betsy Coder ([facility])last seen last week Pt stopped plavix 01/21/16 as instructed by Dr Ellene Route.  Echocardiogram done 09/13/14 normal EKG today Denies any other cardiac studies  CBG reported to me by nurse tech was 282.  Bmet shows 308. Pt was not NPO and stated she had eaten some peaches this morning.

## 2016-01-23 ENCOUNTER — Encounter (HOSPITAL_COMMUNITY): Payer: Self-pay | Admitting: Vascular Surgery

## 2016-01-23 LAB — GLUCOSE, CAPILLARY: Glucose-Capillary: 280 mg/dL — ABNORMAL HIGH (ref 65–99)

## 2016-01-23 LAB — HEMOGLOBIN A1C
Hgb A1c MFr Bld: 9.7 % — ABNORMAL HIGH (ref 4.8–5.6)
Mean Plasma Glucose: 232 mg/dL

## 2016-01-23 NOTE — Progress Notes (Addendum)
Anesthesia Chart Review: Patient is a 80 year old female scheduled for L3-4 laminectomy on 01/28/16 by Dr. Ellene Route.  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 Diona Fanti). BMI is consistent with morbid obesity.   PCP is Dr. Jeanie Cooks, reportedly last seen last week.  Endocrinologist is Dr. Buddy Duty.   Neurologist is with [facility], last visit with Ward Givens, NP on 12/19/15 Katharine Look with neurology wrote on 01/10/16 that a clearance note was faxed to [place] [facility].)   PAT: BP 188/71, HR 59, RR 20, O2 sat 95%, T 36.1C. CBG 280.   Meds include Plavix (held starting 01/21/16), Valium, diltiazem, Lasix, Humalog 50/50, levothyroxine, Toprol, Crestor, Micardis HCT.   01/22/16 EKG: SB at 59 bpm, moderate voltage criteria for LVH, may be normal variant. LAD. Non-specific T wave abnormality. Since last tracing lateral T wave changes are less apparent when compared to 09/16/14 tracing.  09/13/14 Echo: Study Conclusions - Left ventricle: The cavity size was normal. There was moderate concentric hypertrophy. Systolic function was normal. The estimated ejection fraction was in the range of 50% to 55%. Regional wall motion abnormalities cannot be excluded. Features are consistent with a pseudonormal left ventricular filling pattern, with concomitant abnormal relaxation and increased filling pressure (grade 2 diastolic dysfunction). - Aortic valve: There was mild regurgitation. - Left atrium: The atrium was mildly dilated. Impressions: - No cardiac source of emboli was indentified.  08/22/03 Nuclear stress test (ordered by Dr. Terrence Dupont): IMPRESSION:Inferolateral infarct (images demonstrated an area of decreased perfusion involving the inferolateral region of the left ventricle on  both sets of images with a slight associated wall motion abnormality. The findings are likely secondary to an area of scar.) No evidence for ischemia. Estimated ejection fraction was 63%.  09/12/14 Carotid U/S: Summary: Bilateral: 1-39% ICA stenosis. Vertebral artery flow is antegrade.  09/12/14 CT Chest/abd/pelvis: IMPRESSION: 1. No acute abnormalities in the thorax. Borderline mediastinal lymph nodes. 2. Mass upper pole right kidney highly concerning for renal cell carcinoma. 3. Pancreatic cystic lesions with pancreatic ductal prominence. These may represent benign cysts or pseudocysts. However, given the size of the larger, further characterization with MRI is suggested if the patient would be a candidate for optimal MRI imaging. If the patient is not, then follow up CT in six to twelve months is suggested.  4. Sigmoid colon diverticulosis  07/19/15 MRI abdomen: IMPRESSION: 1. 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 2. Stable pancreatic cysts. 3. Small peripheral right hepatic lobe lesion could be a small hemangioma or vascular malformation.  Preoperative labs noted. Cr 0.95. Glucose 308. A1c is 9.7 consistent with mean plasma glucose of 232. CBC WNL.   Voice message left with Janett Billow at Dr. Clarice Pole office regarding elevated A1c and poorly controlled DM. I also attempted to call patient this afternoon, but there was no answer. I will attempt to reach patient later and review with one of our anesthesiologists.  [facility] Short Stay Center/Anesthesiology Phone [phone] 01/23/2016 5:27 PM  Addendum: I was able to speak with patient this morning. She lives alone, and is able to do her cooking, cleaning, driving, etc. Since her CVA she uses a cane for balance when walking outside of the house. She had a chair lift at the stairs installed in her house after the CVA due  to fall risk, but will still walk up and down  the stairs on occasion. She denied chest pain, SOB, syncope, palpitations, known CAD/MI/CHF. She does have chronic ("for years") BLE edema (left foot is worse since her stroke). She has not seen Dr. Terrence Dupont in > 10 years. She said he did not recommend further testing after the stress test. She saw Dr. Jeanie Cooks last week and notified him of surgery plans. She is seeing Dr. Buddy Duty this afternoon to discuss her DM control. She had been on U500 in the past, but was having frequent morning hypoglycemia. DM was better controlled on her current regimen until April 2017 when she had a UTI (fasting sugars running in the low 100's then, but now running 140-low 200's on occasion). The morning of her PAT, she had eaten peaches and drank grape juice because she woke up feeling shaky, although she did not check her CBG. She had not taken her insulin yet either. I did notify her that a CBG > 200 could cancel or delay or surgery. Dr. Diona Fanti is following her right kidney mass. She is due for a MRI in 04/2016. She denied dysuria, fever, and hematuria at present. She does not smoke.  Reviewed above with anesthesiologist Dr. Linna Caprice. Patient with multiple CAD risk factors including poorly controlled DM, HTN, CVA, morbid obesity. He recommends pre-operative cardiology evaluation. I have notified. Jessica at Dr. Clarice Pole office and updated her that patient is seeing Dr. Buddy Duty this afternoon regarding DM control. She made an appointment for patient to be evaluated by Dr. Dorris Carnes on 01/29/16 and plans to rescheduled surgery once she is cleared from a cardiac standpoint. Hopefully, patient can get better glucose control in the meantime.   [facility] Short Stay Center/Anesthesiology Phone [phone] 01/24/2016 1:36 PM

## 2016-01-28 ENCOUNTER — Encounter (HOSPITAL_COMMUNITY): Admission: RE | Payer: Self-pay | Source: Ambulatory Visit

## 2016-01-28 ENCOUNTER — Ambulatory Visit (HOSPITAL_COMMUNITY): Admission: RE | Admit: 2016-01-28 | Payer: Medicare Other | Source: Ambulatory Visit | Admitting: Neurological Surgery

## 2016-01-28 DIAGNOSIS — E890 Postprocedural hypothyroidism: Secondary | ICD-10-CM | POA: Diagnosis not present

## 2016-01-28 DIAGNOSIS — Z85850 Personal history of malignant neoplasm of thyroid: Secondary | ICD-10-CM | POA: Diagnosis not present

## 2016-01-28 DIAGNOSIS — D62 Acute posthemorrhagic anemia: Secondary | ICD-10-CM | POA: Diagnosis not present

## 2016-01-28 DIAGNOSIS — E11649 Type 2 diabetes mellitus with hypoglycemia without coma: Secondary | ICD-10-CM | POA: Diagnosis not present

## 2016-01-28 DIAGNOSIS — E785 Hyperlipidemia, unspecified: Secondary | ICD-10-CM | POA: Diagnosis not present

## 2016-01-28 DIAGNOSIS — Z7902 Long term (current) use of antithrombotics/antiplatelets: Secondary | ICD-10-CM | POA: Diagnosis not present

## 2016-01-28 DIAGNOSIS — M4806 Spinal stenosis, lumbar region: Secondary | ICD-10-CM | POA: Diagnosis present

## 2016-01-28 DIAGNOSIS — Z981 Arthrodesis status: Secondary | ICD-10-CM | POA: Diagnosis not present

## 2016-01-28 DIAGNOSIS — M5416 Radiculopathy, lumbar region: Secondary | ICD-10-CM | POA: Diagnosis not present

## 2016-01-28 DIAGNOSIS — I10 Essential (primary) hypertension: Secondary | ICD-10-CM | POA: Diagnosis not present

## 2016-01-28 DIAGNOSIS — I69354 Hemiplegia and hemiparesis following cerebral infarction affecting left non-dominant side: Secondary | ICD-10-CM | POA: Diagnosis not present

## 2016-01-28 DIAGNOSIS — Z794 Long term (current) use of insulin: Secondary | ICD-10-CM | POA: Diagnosis not present

## 2016-01-28 DIAGNOSIS — Z6841 Body Mass Index (BMI) 40.0 and over, adult: Secondary | ICD-10-CM | POA: Diagnosis not present

## 2016-01-28 DIAGNOSIS — Z79899 Other long term (current) drug therapy: Secondary | ICD-10-CM | POA: Diagnosis not present

## 2016-01-28 SURGERY — LUMBAR LAMINECTOMY/DECOMPRESSION MICRODISCECTOMY 1 LEVEL
Anesthesia: General | Site: Back

## 2016-01-28 NOTE — Progress Notes (Signed)
Cardiology Office Note   Date:  01/29/2016   ID:  Kaylee Mullins, DOB 24-Jul-1936, MRN [id number]  PCP:  Philis Fendt, MD  Cardiologist:   Dorris Carnes, MD    Pt presents for preop risk stratification.     History of Present Illness: Kaylee Mullins is a 80 y.o. female with a history of HTN, HL, DM, CVA  Being evalauted for surgery  Sent for preop risk stratification.   Echo in Jan 2016 LVEF was 50 to 55% DM since 1999. Stroke in 2015    Pt is not that active  Does little house work  Does not walk much She denies CP  Breathing is OK  No palpitations      Outpatient Prescriptions Prior to Visit  Medication Sig Dispense Refill  . clopidogrel (PLAVIX) 75 MG tablet Take 1 tablet (75 mg total) by mouth daily. 30 tablet 1  . [facility] NON-ASPIRIN EXTRA STRENGTH 500 MG tablet TAKE 1 TO 2 TABLETS BY MOUTH TWICE A DAY AS NEEDED PAIN  5  . diltiazem (TIAZAC) 360 MG 24 hr capsule Take 360 mg by mouth daily. Reported on 09/19/2015    . furosemide (LASIX) 40 MG tablet TAKE 1 TABLET BY MOUTH EVERY DAY AS NEEDED FOR LEG SWELLING  5  . HUMALOG MIX 50/50 KWIKPEN (50-50) 100 UNIT/ML Kwikpen INJECT 80 UNITS BEFORE BREAKFAST AND 40 UNITS BEFORE EVENING MEAL  11  . levothyroxine (SYNTHROID, LEVOTHROID) 200 MCG tablet Take 200 mcg by mouth daily.  99  . metoprolol succinate (TOPROL-XL) 100 MG 24 hr tablet Take 100 mg by mouth 2 (two) times daily. Take with or immediately following a meal.    . oxyCODONE-acetaminophen (PERCOCET/ROXICET) 5-325 MG tablet Take 1-2 tablets by mouth every 8 (eight) hours as needed for severe pain. 6 tablet 0  . rosuvastatin (CRESTOR) 20 MG tablet Take 20 mg by mouth daily.    Marland Kitchen telmisartan-hydrochlorothiazide (MICARDIS HCT) 80-12.5 MG tablet Take 1 tablet by mouth daily.  5  . BESIVANCE 0.6 % SUSP Reported on 01/29/2016  1  . cyanocobalamin 1000 MCG tablet Take 1,000 mcg by mouth daily. Reported on 01/29/2016    . diazepam (VALIUM) 5 MG tablet Take 1-2 tablets by mouth once.  Reported on 01/29/2016  0  . DUREZOL 0.05 % EMUL Reported on 01/29/2016  1   No facility-administered medications prior to visit.     Allergies:   Nyquil multi-symptom and Darvon   Past Medical History  Diagnosis Date  . Hypertension   . Hypothyroidism   . Diabetes mellitus     type !  . Cancer ([place])     thyroid cancer  . Spinal stenosis   . Stroke ([place]) 09/2014    left sided weakness  . Chronic kidney disease     questionable mass on kidney. Being followed by Dr Diona Fanti  . Arthritis     knees  . Full dentures     Past Surgical History  Procedure Laterality Date  . Thyroidectomy  2005  . Abdominal hysterectomy      partial  . Tonsillectomy    . Lumbar spine surgery    . Flexible sigmoidoscopy  03/29/2012    Procedure: FLEXIBLE SIGMOIDOSCOPY;  Surgeon: Jeryl Columbia, MD;  Location: [facility] ENDOSCOPY;  Service: Endoscopy;  Laterality: N/A;  fleet enema upon arrival  . Hot hemostasis  03/29/2012    Procedure: HOT HEMOSTASIS (ARGON PLASMA COAGULATION/BICAP);  Surgeon: Jeryl Columbia, MD;  Location: [facility] ENDOSCOPY;  Service: Endoscopy;  Laterality: N/A;  . Cataracts      Removed  11/2015  bilateral     Social History:  The patient  reports that she has never smoked. She does not have any smokeless tobacco history on file. She reports that she does not drink alcohol or use illicit drugs.   Family History:  The patient's family history includes Heart attack in her father; Heart failure in her mother.    ROS:  Please see the history of present illness. All other systems are reviewed and  Negative to the above problem except as noted.    PHYSICAL EXAM: VS:  BP 148/58 mmHg  Pulse 53  Ht 5\' 4"  (1.626 m)  Wt 239 lb 12.8 oz (108.773 kg)  BMI 41.14 kg/m2  GEN: Morbidly obese 80 yo  in no acute distress HEENT: normal Neck: no JVD, carotid bruits, or masses Cardiac: RRR; no murmurs, rubs, or gallops,no edema  Respiratory:  clear to auscultation bilaterally, normal work of  breathing GI: soft, nontender, nondistended, + BS  No hepatomegaly  MS: no deformity Moving all extremities   Skin: warm and dry, no rash Neuro:  Strength and sensation are intact Psych: euthymic mood, full affect   EKG:  EKG is not ordered today.   Lipid Panel    Component Value Date/Time   CHOL 149 09/13/2014 0910   TRIG 131 09/13/2014 0910   HDL 50 09/13/2014 0910   CHOLHDL 3.0 09/13/2014 0910   VLDL 26 09/13/2014 0910   LDLCALC 73 09/13/2014 0910      Wt Readings from Last 3 Encounters:  01/29/16 239 lb 12.8 oz (108.773 kg)  01/22/16 242 lb 11.2 oz (110.088 kg)  12/19/15 242 lb 9.6 oz (110.043 kg)      ASSESSMENT AND PLAN:  1  Preop eval  Pt with several risk factors for CAD  She is not active due to back  I would set up for Lexiscan myovue to evaluate  2  Mrumur  Soft ejection murmur  Will get echo to comfirm no isgnif stenosis  2  HTN  BP is up  She says it runs high   3.  HL  ON statin  Lipids were good 1 year ago  4. DM  On insulin   F/U based on test results    Signed, Dorris Carnes, MD  01/29/2016 10:57 AM    [address], [place], [place]  [address] Phone: [phone]; Fax: [phone]

## 2016-01-29 ENCOUNTER — Ambulatory Visit (INDEPENDENT_AMBULATORY_CARE_PROVIDER_SITE_OTHER): Payer: Medicare Other | Admitting: Internal Medicine

## 2016-01-29 ENCOUNTER — Encounter: Payer: Self-pay | Admitting: Internal Medicine

## 2016-01-29 ENCOUNTER — Telehealth (HOSPITAL_COMMUNITY): Payer: Self-pay | Admitting: Radiology

## 2016-01-29 VITALS — BP 148/58 | HR 53 | Ht 64.0 in | Wt 239.8 lb

## 2016-01-29 DIAGNOSIS — R011 Cardiac murmur, unspecified: Secondary | ICD-10-CM

## 2016-01-29 DIAGNOSIS — Z01818 Encounter for other preprocedural examination: Secondary | ICD-10-CM

## 2016-01-29 DIAGNOSIS — E1159 Type 2 diabetes mellitus with other circulatory complications: Secondary | ICD-10-CM | POA: Diagnosis not present

## 2016-01-29 NOTE — Telephone Encounter (Signed)
Patient given detailed instructions per Myocardial Perfusion Study Information Sheet for the test on 01/30/2016 at 12:45. Patient notified to arrive 15 minutes early and that it is imperative to arrive on time for appointment to keep from having the test rescheduled.  If you need to cancel or reschedule your appointment, please call the office within 24 hours of your appointment. Failure to do so may result in a cancellation of your appointment, and a $50 no show fee. Patient verbalized understanding.EHK

## 2016-01-29 NOTE — Patient Instructions (Signed)
Your physician recommends that you continue on your current medications as directed. Please refer to the Current Medication list given to you today. Your physician has requested that you have an echocardiogram. Echocardiography is a painless test that uses sound waves to create images of your heart. It provides your doctor with information about the size and shape of your heart and how well your heart's chambers and valves are working. This procedure takes approximately one hour. There are no restrictions for this procedure.  Your physician has requested that you have a lexiscan myoview. For further information please visit [email or web address]. Please follow instruction sheet, as given.  Your physician recommends that you schedule a follow-up appointment as needed with Dr. Harrington Challenger.

## 2016-01-30 ENCOUNTER — Other Ambulatory Visit: Payer: Self-pay

## 2016-01-30 ENCOUNTER — Ambulatory Visit (HOSPITAL_COMMUNITY): Admission: RE | Admit: 2016-01-30 | Payer: Medicare Other | Source: Ambulatory Visit

## 2016-01-30 ENCOUNTER — Ambulatory Visit (HOSPITAL_BASED_OUTPATIENT_CLINIC_OR_DEPARTMENT_OTHER): Payer: Medicare Other

## 2016-01-30 ENCOUNTER — Ambulatory Visit (HOSPITAL_COMMUNITY): Payer: Medicare Other | Attending: Cardiovascular Disease

## 2016-01-30 DIAGNOSIS — Z01818 Encounter for other preprocedural examination: Secondary | ICD-10-CM

## 2016-01-30 DIAGNOSIS — Z6841 Body Mass Index (BMI) 40.0 and over, adult: Secondary | ICD-10-CM | POA: Diagnosis not present

## 2016-01-30 DIAGNOSIS — I119 Hypertensive heart disease without heart failure: Secondary | ICD-10-CM | POA: Diagnosis not present

## 2016-01-30 DIAGNOSIS — R011 Cardiac murmur, unspecified: Secondary | ICD-10-CM | POA: Insufficient documentation

## 2016-01-30 DIAGNOSIS — E785 Hyperlipidemia, unspecified: Secondary | ICD-10-CM | POA: Insufficient documentation

## 2016-01-30 DIAGNOSIS — E1159 Type 2 diabetes mellitus with other circulatory complications: Secondary | ICD-10-CM | POA: Diagnosis not present

## 2016-01-30 DIAGNOSIS — I351 Nonrheumatic aortic (valve) insufficiency: Secondary | ICD-10-CM | POA: Diagnosis not present

## 2016-01-30 DIAGNOSIS — E119 Type 2 diabetes mellitus without complications: Secondary | ICD-10-CM | POA: Insufficient documentation

## 2016-01-30 LAB — ECHOCARDIOGRAM COMPLETE
AO mean calculated velocity dopler: 122 cm/s
AV Area VTI index: 0.89 cm2/m2
AV Area VTI: 2.12 cm2
AV Area mean vel: 2 cm2
AV Mean grad: 7 mmHg
AV Peak grad: 12 mmHg
AV VEL mean LVOT/AV: 0.58
AV area mean vel ind: 0.95 cm2/m2
AV peak Index: 1
AV pk vel: 173 cm/s
AV vel: 1.87
Ao pk vel: 0.61 m/s
E decel time: 278 msec
E/e' ratio: 13.33
FS: 40 % (ref 28–44)
Height: 64 in
IVS/LV PW RATIO, ED: 0.91
LA ID, A-P, ES: 36 mm
LA diam end sys: 36 mm
LA diam index: 1.71 cm/m2
LA vol A4C: 32 ml
LA vol index: 23.2 mL/m2
LA vol: 49 mL
LV E/e' medial: 13.33
LV E/e'average: 13.33
LV PW d: 14.6 mm — AB (ref 0.6–1.1)
LV dias vol index: 71 mL/m2
LV dias vol: 149 mL — AB (ref 46–106)
LV e' LATERAL: 5.26 cm/s
LV sys vol index: 35 mL/m2
LV sys vol: 73 mL — AB (ref 14–42)
LVOT SV: 73 mL
LVOT VTI: 21 cm
LVOT area: 3.46 cm2
LVOT diameter: 21 mm
LVOT peak VTI: 0.54 cm
LVOT peak grad rest: 4 mmHg
LVOT peak vel: 106 cm/s
MV Dec: 278
MV pk A vel: 65.2 m/s
MV pk E vel: 70.1 m/s
P 1/2 time: 483 ms
Simpson's disk: 51
Stroke v: 76 ml
TDI e' lateral: 5.26
TDI e' medial: 4.28
VTI: 38.9 cm
Valve area index: 0.89
Valve area: 1.87 cm2
Weight: 3824 oz

## 2016-01-30 MED ORDER — REGADENOSON 0.4 MG/5ML IV SOLN
0.4000 mg | Freq: Once | INTRAVENOUS | Status: AC
  Administered 2016-01-30: 0.4 mg via INTRAVENOUS

## 2016-01-30 MED ORDER — TECHNETIUM TC 99M TETROFOSMIN IV KIT
33.0000 | PACK | Freq: Once | INTRAVENOUS | Status: AC | PRN
  Administered 2016-01-30: 33 via INTRAVENOUS
  Filled 2016-01-30: qty 33

## 2016-01-31 ENCOUNTER — Ambulatory Visit (HOSPITAL_COMMUNITY): Payer: Medicare Other | Attending: Internal Medicine

## 2016-01-31 LAB — MYOCARDIAL PERFUSION IMAGING
LV dias vol: 140 mL (ref 46–106)
LV sys vol: 1 mL
Peak HR: 65 {beats}/min
RATE: 0.26
Rest HR: 54 {beats}/min
SDS: 1
SRS: 12
SSS: 13
TID: 1.09

## 2016-01-31 MED ORDER — TECHNETIUM TC 99M TETROFOSMIN IV KIT
32.2000 | PACK | Freq: Once | INTRAVENOUS | Status: AC | PRN
  Administered 2016-01-31: 32.2 via INTRAVENOUS
  Filled 2016-01-31: qty 32

## 2016-02-05 ENCOUNTER — Other Ambulatory Visit: Payer: Self-pay | Admitting: Neurological Surgery

## 2016-02-05 NOTE — Progress Notes (Addendum)
Anesthesia follow-up: See my note from 01/23/16 with 6/917 addendum. L3-4 laminectomy was postponed to allow time for cardiology evaluation. Following a stress and echo, Dr. Dorris Carnes ultimately cleared her for surgery. Procedure has been rescheduled for 02/10/16.  01/30/16 Echo: Study Conclusions - Left ventricle: The cavity size was normal. There was mild  concentric hypertrophy. Systolic function was normal. The  estimated ejection fraction was in the range of 55% to 60%. Wall  motion was normal; there were no regional wall motion  abnormalities. Features are consistent with a pseudonormal left  ventricular filling pattern, with concomitant abnormal relaxation  and increased filling pressure (grade 2 diastolic dysfunction). - Aortic valve: There was trivial regurgitation. Mean gradient (S):  7 mm Hg. Peak gradient (S): 12 mm Hg. Valve area (VTI): 1.87  cm^2. Valve area (Vmax): 2.12 cm^2. Valve area (Vmean): 2 cm^2. - Left atrium: The atrium was mildly dilated.  01/31/16 Nuclear stress test:  The left ventricular ejection fraction is normal (55-65%).  Nuclear stress EF: 56%.  Findings consistent with prior myocardial infarction with peri-infarct ischemia.  This is a low risk study. Moderate area of Inferior and inferolateral wall infarct with mild peri infarct ischemia. EF 56% Note the wrong patients MPI images were scanned into the chart. Dr. Harrington Challenger reviewed and wrote, "No evidence of ischemia Possible inferior wal MI though this may just be some shadowing by diaphragm as echo shows normal wall motion I would not pursue other cardiac testing Pt is low risk for major heart event and OK to proceed with surgery."  01/24/16 diabetes follow-up visit with Dr. Buddy Duty reviewed. She told him of her upcoming back surgery. He increased her Humalog 50/50 to 90 units before breakfast and 60 units before evening meal (had been on 80 units/40 units). In regards to her papillary thyroid cancer  history, he recommended ultrasound with FNA as she had residual thyroid tissue (enlarging on the left) by 10/04/15 neck U/S and also rising thyroglobulin levels. Patient had previously declined, but willing to reconsider after her spinal surgery. He continued her on levothyroxine at 175 mcg.  Her labs will need to be updated prior to surgery. She reports her fasting CBGs are still up a little but better overall since her insulin adjustment. They are ranging < 200 (150 this morning). Her Plavix has been on hold since 02/02/16. Her surgeon instructed her to arrive at 1 PM on 02/10/16 (for an anticipated 4 PM surgery start).  Further evaluation on the day of surgery by her anesthesiologist and surgeon to ensure no acute changes and that labs are acceptable prior to proceeding.  [facility] Short Stay Center/Anesthesiology Phone [phone] 02/06/2016 3:37 PM

## 2016-02-07 NOTE — Progress Notes (Signed)
Ms Cobaugh's 50/50 has been been increase by Dr Buddy Duty to 90 unints in am and 60 units in the pm. I instructed patient to not take Insulin the morning of surgery, but to take 42 units of Insulin Sunday evening.(70 %).  Ms Karel reported that CBG this am was 180. "I'm going to keep working on it, I want it to be lower when I come to the hospital.  I instructed patient to check CBG to check CBG first thing Monday morning and every 2 hours until she comes to the hospital and if it is less than 70 to treat it with 1/2 cup of clear juice like apple juice or cranberry juice. I instructed patient to recheck CBG in 15 minutes and if CBG is not greater than 70, to  Call [phone] (pre- op). If it is before pre-op opens to retreat as before and recheck CBG in 15 minutes. I told patient to make note of time that liquid is taken and amount, that surgical time may have to be adjusted.  I Had patient tell me when and how she is to shower with surgical scrub and, change sheets and wear clean PJ's and clothes to the hospital.

## 2016-02-10 ENCOUNTER — Observation Stay (HOSPITAL_COMMUNITY)
Admission: AD | Admit: 2016-02-10 | Discharge: 2016-02-14 | Disposition: A | Discharge status: Skilled Nursing Facility | Payer: Medicare Other | Source: Ambulatory Visit | Attending: Neurological Surgery | Admitting: Neurological Surgery

## 2016-02-10 ENCOUNTER — Ambulatory Visit (HOSPITAL_COMMUNITY): Payer: Medicare Other

## 2016-02-10 ENCOUNTER — Encounter (HOSPITAL_COMMUNITY)
Admission: AD | Disposition: A | Discharge status: Skilled Nursing Facility | Payer: Self-pay | Source: Ambulatory Visit | Attending: Neurological Surgery

## 2016-02-10 ENCOUNTER — Ambulatory Visit (HOSPITAL_COMMUNITY): Payer: Medicare Other | Admitting: Vascular Surgery

## 2016-02-10 ENCOUNTER — Encounter (HOSPITAL_COMMUNITY): Payer: Self-pay | Admitting: *Deleted

## 2016-02-10 DIAGNOSIS — I69354 Hemiplegia and hemiparesis following cerebral infarction affecting left non-dominant side: Secondary | ICD-10-CM | POA: Insufficient documentation

## 2016-02-10 DIAGNOSIS — E785 Hyperlipidemia, unspecified: Secondary | ICD-10-CM | POA: Diagnosis present

## 2016-02-10 DIAGNOSIS — Z6841 Body Mass Index (BMI) 40.0 and over, adult: Secondary | ICD-10-CM | POA: Insufficient documentation

## 2016-02-10 DIAGNOSIS — Z79899 Other long term (current) drug therapy: Secondary | ICD-10-CM | POA: Insufficient documentation

## 2016-02-10 DIAGNOSIS — E11649 Type 2 diabetes mellitus with hypoglycemia without coma: Secondary | ICD-10-CM | POA: Diagnosis not present

## 2016-02-10 DIAGNOSIS — D62 Acute posthemorrhagic anemia: Secondary | ICD-10-CM | POA: Insufficient documentation

## 2016-02-10 DIAGNOSIS — E08649 Diabetes mellitus due to underlying condition with hypoglycemia without coma: Secondary | ICD-10-CM

## 2016-02-10 DIAGNOSIS — M5416 Radiculopathy, lumbar region: Secondary | ICD-10-CM | POA: Diagnosis not present

## 2016-02-10 DIAGNOSIS — E039 Hypothyroidism, unspecified: Secondary | ICD-10-CM | POA: Diagnosis present

## 2016-02-10 DIAGNOSIS — Z981 Arthrodesis status: Secondary | ICD-10-CM | POA: Insufficient documentation

## 2016-02-10 DIAGNOSIS — R509 Fever, unspecified: Secondary | ICD-10-CM

## 2016-02-10 DIAGNOSIS — Z85850 Personal history of malignant neoplasm of thyroid: Secondary | ICD-10-CM | POA: Insufficient documentation

## 2016-02-10 DIAGNOSIS — R945 Abnormal results of liver function studies: Secondary | ICD-10-CM

## 2016-02-10 DIAGNOSIS — M4806 Spinal stenosis, lumbar region: Principal | ICD-10-CM | POA: Insufficient documentation

## 2016-02-10 DIAGNOSIS — Z7902 Long term (current) use of antithrombotics/antiplatelets: Secondary | ICD-10-CM | POA: Insufficient documentation

## 2016-02-10 DIAGNOSIS — M48062 Spinal stenosis, lumbar region with neurogenic claudication: Secondary | ICD-10-CM | POA: Diagnosis present

## 2016-02-10 DIAGNOSIS — Z794 Long term (current) use of insulin: Secondary | ICD-10-CM | POA: Insufficient documentation

## 2016-02-10 DIAGNOSIS — I10 Essential (primary) hypertension: Secondary | ICD-10-CM | POA: Diagnosis present

## 2016-02-10 DIAGNOSIS — E119 Type 2 diabetes mellitus without complications: Secondary | ICD-10-CM

## 2016-02-10 DIAGNOSIS — Z419 Encounter for procedure for purposes other than remedying health state, unspecified: Secondary | ICD-10-CM

## 2016-02-10 DIAGNOSIS — E890 Postprocedural hypothyroidism: Secondary | ICD-10-CM | POA: Insufficient documentation

## 2016-02-10 DIAGNOSIS — I639 Cerebral infarction, unspecified: Secondary | ICD-10-CM | POA: Diagnosis present

## 2016-02-10 HISTORY — DX: Spinal stenosis, lumbar region with neurogenic claudication: M48.062

## 2016-02-10 HISTORY — PX: LUMBAR LAMINECTOMY/DECOMPRESSION MICRODISCECTOMY: SHX5026

## 2016-02-10 LAB — CBC
HCT: 39.5 % (ref 36.0–46.0)
Hemoglobin: 12.9 g/dL (ref 12.0–15.0)
MCH: 30.6 pg (ref 26.0–34.0)
MCHC: 32.7 g/dL (ref 30.0–36.0)
MCV: 93.8 fL (ref 78.0–100.0)
Platelets: 276 10*3/uL (ref 150–400)
RBC: 4.21 MIL/uL (ref 3.87–5.11)
RDW: 14 % (ref 11.5–15.5)
WBC: 7.5 10*3/uL (ref 4.0–10.5)

## 2016-02-10 LAB — BASIC METABOLIC PANEL
Anion gap: 10 (ref 5–15)
BUN: 15 mg/dL (ref 6–20)
CO2: 24 mmol/L (ref 22–32)
Calcium: 9.4 mg/dL (ref 8.9–10.3)
Chloride: 105 mmol/L (ref 101–111)
Creatinine, Ser: 1.13 mg/dL — ABNORMAL HIGH (ref 0.44–1.00)
GFR calc Af Amer: 53 mL/min — ABNORMAL LOW (ref >60)
GFR calc non Af Amer: 46 mL/min — ABNORMAL LOW (ref >60)
Glucose, Bld: 212 mg/dL — ABNORMAL HIGH (ref 65–99)
Potassium: 4.2 mmol/L (ref 3.5–5.1)
Sodium: 139 mmol/L (ref 135–145)

## 2016-02-10 LAB — GLUCOSE, CAPILLARY
Glucose-Capillary: 200 mg/dL — ABNORMAL HIGH (ref 65–99)
Glucose-Capillary: 165 mg/dL — ABNORMAL HIGH (ref 65–99)
Glucose-Capillary: 210 mg/dL — ABNORMAL HIGH (ref 65–99)

## 2016-02-10 SURGERY — LUMBAR LAMINECTOMY/DECOMPRESSION MICRODISCECTOMY 1 LEVEL
Anesthesia: General | Site: Back

## 2016-02-10 MED ORDER — OXYCODONE-ACETAMINOPHEN 5-325 MG PO TABS
1.0000 | ORAL_TABLET | ORAL | Status: DC | PRN
  Administered 2016-02-11 – 2016-02-12 (×2): 1 via ORAL
  Administered 2016-02-12 (×2): 2 via ORAL
  Administered 2016-02-13: 1 via ORAL
  Administered 2016-02-13 – 2016-02-14 (×2): 2 via ORAL
  Filled 2016-02-10: qty 1
  Filled 2016-02-10 (×4): qty 2
  Filled 2016-02-10 (×2): qty 1

## 2016-02-10 MED ORDER — ROCURONIUM BROMIDE 100 MG/10ML IV SOLN
INTRAVENOUS | Status: DC | PRN
  Administered 2016-02-10: 50 mg via INTRAVENOUS

## 2016-02-10 MED ORDER — THROMBIN 5000 UNITS EX SOLR
CUTANEOUS | Status: DC | PRN
  Administered 2016-02-10 (×2): 5000 [IU] via TOPICAL

## 2016-02-10 MED ORDER — FUROSEMIDE 40 MG PO TABS
40.0000 mg | ORAL_TABLET | Freq: Every day | ORAL | Status: DC
  Administered 2016-02-12 – 2016-02-14 (×3): 40 mg via ORAL
  Filled 2016-02-10 (×3): qty 1

## 2016-02-10 MED ORDER — PHENOL 1.4 % MT LIQD: 1.0000 | OROMUCOSAL | Status: DC | PRN

## 2016-02-10 MED ORDER — ONDANSETRON HCL 4 MG/2ML IJ SOLN: 4.0000 mg | INTRAMUSCULAR | Status: DC | PRN

## 2016-02-10 MED ORDER — LIDOCAINE-EPINEPHRINE 1 %-1:100000 IJ SOLN
INTRAMUSCULAR | Status: DC | PRN
  Administered 2016-02-10: 10 mL

## 2016-02-10 MED ORDER — PROPOFOL 10 MG/ML IV BOLUS
INTRAVENOUS | Status: AC
Start: 2016-02-10 — End: 2016-02-10
  Filled 2016-02-10: qty 20

## 2016-02-10 MED ORDER — CEFAZOLIN IN D5W 1 GM/50ML IV SOLN
1.0000 g | Freq: Three times a day (TID) | INTRAVENOUS | Status: AC
  Administered 2016-02-10 – 2016-02-11 (×2): 1 g via INTRAVENOUS
  Filled 2016-02-10 (×2): qty 50

## 2016-02-10 MED ORDER — BISACODYL 10 MG RE SUPP
10.0000 mg | Freq: Every day | RECTAL | Status: DC | PRN
  Administered 2016-02-13: 10 mg via RECTAL
  Filled 2016-02-10: qty 1

## 2016-02-10 MED ORDER — TELMISARTAN-HCTZ 80-25 MG PO TABS: 1.0000 | ORAL_TABLET | Freq: Every day | ORAL | Status: DC

## 2016-02-10 MED ORDER — ONDANSETRON HCL 4 MG/2ML IJ SOLN
INTRAMUSCULAR | Status: DC | PRN
  Administered 2016-02-10: 4 mg via INTRAVENOUS

## 2016-02-10 MED ORDER — MENTHOL 3 MG MT LOZG: 1.0000 | LOZENGE | OROMUCOSAL | Status: DC | PRN

## 2016-02-10 MED ORDER — HYDROMORPHONE HCL 1 MG/ML IJ SOLN
INTRAMUSCULAR | Status: AC
  Filled 2016-02-10: qty 1

## 2016-02-10 MED ORDER — ONDANSETRON HCL 4 MG/2ML IJ SOLN
INTRAMUSCULAR | Status: AC
  Filled 2016-02-10: qty 2

## 2016-02-10 MED ORDER — PHENYLEPHRINE HCL 10 MG/ML IJ SOLN
INTRAMUSCULAR | Status: DC | PRN
  Administered 2016-02-10: 80 ug via INTRAVENOUS
  Administered 2016-02-10: 120 ug via INTRAVENOUS
  Administered 2016-02-10: 80 ug via INTRAVENOUS
  Administered 2016-02-10 (×2): 120 ug via INTRAVENOUS
  Administered 2016-02-10 (×2): 80 ug via INTRAVENOUS
  Administered 2016-02-10: 120 ug via INTRAVENOUS
  Administered 2016-02-10 (×2): 80 ug via INTRAVENOUS

## 2016-02-10 MED ORDER — HYDROCODONE-ACETAMINOPHEN 5-325 MG PO TABS
1.0000 | ORAL_TABLET | ORAL | Status: DC | PRN
  Administered 2016-02-10 – 2016-02-11 (×3): 1 via ORAL
  Administered 2016-02-11 – 2016-02-14 (×3): 2 via ORAL
  Filled 2016-02-10 (×2): qty 1
  Filled 2016-02-10 (×2): qty 2
  Filled 2016-02-10: qty 1
  Filled 2016-02-10: qty 2

## 2016-02-10 MED ORDER — FENTANYL CITRATE (PF) 250 MCG/5ML IJ SOLN
INTRAMUSCULAR | Status: AC
  Filled 2016-02-10: qty 5

## 2016-02-10 MED ORDER — PHENYLEPHRINE HCL 10 MG/ML IJ SOLN
10.0000 mg | INTRAVENOUS | Status: DC | PRN
  Administered 2016-02-10: 30 ug/min via INTRAVENOUS

## 2016-02-10 MED ORDER — DOCUSATE SODIUM 100 MG PO CAPS
100.0000 mg | ORAL_CAPSULE | Freq: Two times a day (BID) | ORAL | Status: DC
  Administered 2016-02-10 – 2016-02-14 (×7): 100 mg via ORAL
  Filled 2016-02-10 (×7): qty 1

## 2016-02-10 MED ORDER — METHOCARBAMOL 500 MG PO TABS
500.0000 mg | ORAL_TABLET | Freq: Four times a day (QID) | ORAL | Status: DC | PRN
  Administered 2016-02-10 – 2016-02-14 (×7): 500 mg via ORAL
  Filled 2016-02-10 (×7): qty 1

## 2016-02-10 MED ORDER — SUGAMMADEX SODIUM 500 MG/5ML IV SOLN
INTRAVENOUS | Status: DC | PRN
  Administered 2016-02-10: 200 mg via INTRAVENOUS

## 2016-02-10 MED ORDER — PROPOFOL 10 MG/ML IV BOLUS
INTRAVENOUS | Status: DC | PRN
  Administered 2016-02-10: 150 mg via INTRAVENOUS
  Administered 2016-02-10: 30 mg via INTRAVENOUS

## 2016-02-10 MED ORDER — SODIUM CHLORIDE 0.9% FLUSH
3.0000 mL | Freq: Two times a day (BID) | INTRAVENOUS | Status: DC
  Administered 2016-02-11 – 2016-02-14 (×7): 3 mL via INTRAVENOUS

## 2016-02-10 MED ORDER — HYDROCHLOROTHIAZIDE 25 MG PO TABS
25.0000 mg | ORAL_TABLET | Freq: Every day | ORAL | Status: DC
  Administered 2016-02-11 – 2016-02-14 (×4): 25 mg via ORAL
  Filled 2016-02-10 (×4): qty 1

## 2016-02-10 MED ORDER — OXYCODONE-ACETAMINOPHEN 5-325 MG PO TABS: 1.0000 | ORAL_TABLET | Freq: Three times a day (TID) | ORAL | Status: DC | PRN

## 2016-02-10 MED ORDER — ACETAMINOPHEN 325 MG PO TABS
650.0000 mg | ORAL_TABLET | ORAL | Status: DC | PRN
  Administered 2016-02-11: 650 mg via ORAL
  Filled 2016-02-10: qty 2

## 2016-02-10 MED ORDER — SENNA 8.6 MG PO TABS
1.0000 | ORAL_TABLET | Freq: Two times a day (BID) | ORAL | Status: DC
  Administered 2016-02-10 – 2016-02-13 (×6): 8.6 mg via ORAL
  Filled 2016-02-10 (×7): qty 1

## 2016-02-10 MED ORDER — ROSUVASTATIN CALCIUM 5 MG PO TABS
20.0000 mg | ORAL_TABLET | Freq: Every day | ORAL | Status: DC
  Administered 2016-02-10 – 2016-02-11 (×2): 20 mg via ORAL
  Filled 2016-02-10 (×2): qty 1

## 2016-02-10 MED ORDER — CEFAZOLIN SODIUM-DEXTROSE 2-4 GM/100ML-% IV SOLN
INTRAVENOUS | Status: AC
  Filled 2016-02-10: qty 100

## 2016-02-10 MED ORDER — HYDROMORPHONE HCL 1 MG/ML IJ SOLN
INTRAMUSCULAR | Status: AC
Start: 2016-02-10 — End: 2016-02-11
  Filled 2016-02-10: qty 1

## 2016-02-10 MED ORDER — ACETAMINOPHEN 650 MG RE SUPP: 650.0000 mg | RECTAL | Status: DC | PRN

## 2016-02-10 MED ORDER — INSULIN ASPART 100 UNIT/ML ~~LOC~~ SOLN
0.0000 [IU] | Freq: Three times a day (TID) | SUBCUTANEOUS | Status: DC
  Administered 2016-02-11 (×3): 5 [IU] via SUBCUTANEOUS
  Administered 2016-02-12: 3 [IU] via SUBCUTANEOUS
  Administered 2016-02-12: 5 [IU] via SUBCUTANEOUS
  Administered 2016-02-13: 2 [IU] via SUBCUTANEOUS
  Administered 2016-02-13 (×2): 3 [IU] via SUBCUTANEOUS

## 2016-02-10 MED ORDER — ALBUMIN HUMAN 5 % IV SOLN
INTRAVENOUS | Status: DC | PRN
  Administered 2016-02-10: 18:00:00 via INTRAVENOUS

## 2016-02-10 MED ORDER — HEMOSTATIC AGENTS (NO CHARGE) OPTIME
TOPICAL | Status: DC | PRN
  Administered 2016-02-10: 1 via TOPICAL

## 2016-02-10 MED ORDER — HYDROMORPHONE HCL 1 MG/ML IJ SOLN
0.2500 mg | INTRAMUSCULAR | Status: DC | PRN
  Administered 2016-02-10: 0.25 mg via INTRAVENOUS
  Administered 2016-02-10: 0.5 mg via INTRAVENOUS
  Administered 2016-02-10 (×2): 0.25 mg via INTRAVENOUS

## 2016-02-10 MED ORDER — METOPROLOL SUCCINATE ER 100 MG PO TB24
100.0000 mg | ORAL_TABLET | Freq: Two times a day (BID) | ORAL | Status: DC
  Administered 2016-02-11 – 2016-02-14 (×7): 100 mg via ORAL
  Filled 2016-02-10 (×5): qty 1
  Filled 2016-02-10: qty 4
  Filled 2016-02-10: qty 1
  Filled 2016-02-10: qty 4

## 2016-02-10 MED ORDER — SODIUM CHLORIDE 0.9% FLUSH: 3.0000 mL | INTRAVENOUS | Status: DC | PRN

## 2016-02-10 MED ORDER — CEFAZOLIN SODIUM-DEXTROSE 2-4 GM/100ML-% IV SOLN
2.0000 g | INTRAVENOUS | Status: AC
  Administered 2016-02-10: 2 g via INTRAVENOUS

## 2016-02-10 MED ORDER — PROMETHAZINE HCL 25 MG/ML IJ SOLN: 6.2500 mg | INTRAMUSCULAR | Status: DC | PRN

## 2016-02-10 MED ORDER — BUPIVACAINE HCL (PF) 0.5 % IJ SOLN
INTRAMUSCULAR | Status: DC | PRN
  Administered 2016-02-10: 10 mL
  Administered 2016-02-10: 20 mL

## 2016-02-10 MED ORDER — DILTIAZEM HCL ER COATED BEADS 180 MG PO CP24
360.0000 mg | ORAL_CAPSULE | Freq: Every day | ORAL | Status: DC
  Administered 2016-02-11 – 2016-02-14 (×4): 360 mg via ORAL
  Filled 2016-02-10: qty 1
  Filled 2016-02-10 (×3): qty 2

## 2016-02-10 MED ORDER — LIDOCAINE 2% (20 MG/ML) 5 ML SYRINGE
INTRAMUSCULAR | Status: AC
Start: 2016-02-10 — End: 2016-02-10
  Filled 2016-02-10: qty 5

## 2016-02-10 MED ORDER — ROCURONIUM BROMIDE 50 MG/5ML IV SOLN
INTRAVENOUS | Status: AC
Start: 2016-02-10 — End: 2016-02-10
  Filled 2016-02-10: qty 1

## 2016-02-10 MED ORDER — MORPHINE SULFATE (PF) 2 MG/ML IV SOLN: 1.0000 mg | INTRAVENOUS | Status: DC | PRN

## 2016-02-10 MED ORDER — POLYETHYLENE GLYCOL 3350 17 G PO PACK
17.0000 g | PACK | Freq: Every day | ORAL | Status: DC | PRN
  Filled 2016-02-10: qty 1

## 2016-02-10 MED ORDER — INSULIN ASPART 100 UNIT/ML ~~LOC~~ SOLN
0.0000 [IU] | Freq: Every day | SUBCUTANEOUS | Status: DC
  Administered 2016-02-10: 2 [IU] via SUBCUTANEOUS

## 2016-02-10 MED ORDER — GLYCOPYRROLATE 0.2 MG/ML IJ SOLN
INTRAMUSCULAR | Status: DC | PRN
  Administered 2016-02-10 (×2): 0.2 mg via INTRAVENOUS

## 2016-02-10 MED ORDER — LIDOCAINE HCL (CARDIAC) 20 MG/ML IV SOLN
INTRAVENOUS | Status: DC | PRN
  Administered 2016-02-10: 60 mg via INTRAVENOUS

## 2016-02-10 MED ORDER — KETOROLAC TROMETHAMINE 15 MG/ML IJ SOLN: 15.0000 mg | Freq: Four times a day (QID) | INTRAMUSCULAR | Status: DC

## 2016-02-10 MED ORDER — METHOCARBAMOL 1000 MG/10ML IJ SOLN
500.0000 mg | Freq: Four times a day (QID) | INTRAVENOUS | Status: DC | PRN
  Filled 2016-02-10: qty 5

## 2016-02-10 MED ORDER — LACTATED RINGERS IV SOLN
INTRAVENOUS | Status: DC
  Administered 2016-02-10 (×3): via INTRAVENOUS

## 2016-02-10 MED ORDER — EPHEDRINE SULFATE 50 MG/ML IJ SOLN
INTRAMUSCULAR | Status: DC | PRN
  Administered 2016-02-10 (×5): 5 mg via INTRAVENOUS

## 2016-02-10 MED ORDER — 0.9 % SODIUM CHLORIDE (POUR BTL) OPTIME
TOPICAL | Status: DC | PRN
  Administered 2016-02-10: 1000 mL

## 2016-02-10 MED ORDER — SODIUM CHLORIDE 0.9 % IR SOLN
Status: DC | PRN
  Administered 2016-02-10: 17:00:00

## 2016-02-10 MED ORDER — IRBESARTAN 300 MG PO TABS
300.0000 mg | ORAL_TABLET | Freq: Every day | ORAL | Status: DC
  Administered 2016-02-11 – 2016-02-14 (×4): 300 mg via ORAL
  Filled 2016-02-10 (×4): qty 1

## 2016-02-10 MED ORDER — THROMBIN 5000 UNITS EX SOLR
CUTANEOUS | Status: DC | PRN
  Administered 2016-02-10: 17:00:00 via TOPICAL

## 2016-02-10 MED ORDER — SODIUM CHLORIDE 0.9 % IV SOLN: 250.0000 mL | INTRAVENOUS | Status: DC

## 2016-02-10 MED ORDER — FENTANYL CITRATE (PF) 100 MCG/2ML IJ SOLN
INTRAMUSCULAR | Status: DC | PRN
  Administered 2016-02-10: 50 ug via INTRAVENOUS
  Administered 2016-02-10: 100 ug via INTRAVENOUS

## 2016-02-10 MED ORDER — LEVOTHYROXINE SODIUM 100 MCG PO TABS
200.0000 ug | ORAL_TABLET | Freq: Every day | ORAL | Status: DC
  Administered 2016-02-11 – 2016-02-14 (×4): 200 ug via ORAL
  Filled 2016-02-10 (×5): qty 2

## 2016-02-10 MED ORDER — ALUM & MAG HYDROXIDE-SIMETH 200-200-20 MG/5ML PO SUSP: 30.0000 mL | Freq: Four times a day (QID) | ORAL | Status: DC | PRN

## 2016-02-10 MED ORDER — FLEET ENEMA 7-19 GM/118ML RE ENEM: 1.0000 | ENEMA | Freq: Once | RECTAL | Status: DC | PRN

## 2016-02-10 SURGICAL SUPPLY — 51 items
BAG DECANTER FOR FLEXI CONT (MISCELLANEOUS) ×2 IMPLANT
BLADE CLIPPER SURG (BLADE) IMPLANT
BUR ACORN 6.0 (BURR) IMPLANT
BUR MATCHSTICK NEURO 3.0 LAGG (BURR) ×2 IMPLANT
CANISTER SUCT 3000ML PPV (MISCELLANEOUS) ×2 IMPLANT
DECANTER SPIKE VIAL GLASS SM (MISCELLANEOUS) ×2 IMPLANT
DERMABOND ADVANCED (GAUZE/BANDAGES/DRESSINGS) ×1
DERMABOND ADVANCED .7 DNX12 (GAUZE/BANDAGES/DRESSINGS) ×1 IMPLANT
DEVICE DISSECT PLASMABLAD 3.0S (MISCELLANEOUS) ×1 IMPLANT
DRAPE LAPAROTOMY T 102X78X121 (DRAPES) ×2 IMPLANT
DRAPE MICROSCOPE LEICA (MISCELLANEOUS) IMPLANT
DRAPE POUCH INSTRU U-SHP 10X18 (DRAPES) ×2 IMPLANT
DRAPE PROXIMA HALF (DRAPES) IMPLANT
DRSG OPSITE POSTOP 4X6 (GAUZE/BANDAGES/DRESSINGS) ×2 IMPLANT
DURAPREP 26ML APPLICATOR (WOUND CARE) ×2 IMPLANT
ELECT REM PT RETURN 9FT ADLT (ELECTROSURGICAL) ×2
ELECTRODE REM PT RTRN 9FT ADLT (ELECTROSURGICAL) ×1 IMPLANT
GAUZE SPONGE 4X4 12PLY STRL (GAUZE/BANDAGES/DRESSINGS) ×2 IMPLANT
GAUZE SPONGE 4X4 16PLY XRAY LF (GAUZE/BANDAGES/DRESSINGS) IMPLANT
GLOVE BIOGEL PI IND STRL 8.5 (GLOVE) ×1 IMPLANT
GLOVE BIOGEL PI INDICATOR 8.5 (GLOVE) ×1
GLOVE ECLIPSE 8.5 STRL (GLOVE) ×2 IMPLANT
GLOVE EXAM NITRILE LRG STRL (GLOVE) IMPLANT
GLOVE EXAM NITRILE MD LF STRL (GLOVE) IMPLANT
GLOVE EXAM NITRILE XL STR (GLOVE) IMPLANT
GLOVE EXAM NITRILE XS STR PU (GLOVE) IMPLANT
GOWN STRL REUS W/ TWL LRG LVL3 (GOWN DISPOSABLE) IMPLANT
GOWN STRL REUS W/ TWL XL LVL3 (GOWN DISPOSABLE) IMPLANT
GOWN STRL REUS W/TWL 2XL LVL3 (GOWN DISPOSABLE) ×2 IMPLANT
GOWN STRL REUS W/TWL LRG LVL3 (GOWN DISPOSABLE)
GOWN STRL REUS W/TWL XL LVL3 (GOWN DISPOSABLE)
HEMOSTAT POWDER KIT SURGIFOAM (HEMOSTASIS) ×2 IMPLANT
KIT BASIN OR (CUSTOM PROCEDURE TRAY) ×2 IMPLANT
KIT ROOM TURNOVER OR (KITS) ×2 IMPLANT
NEEDLE HYPO 22GX1.5 SAFETY (NEEDLE) ×2 IMPLANT
NEEDLE SPNL 20GX3.5 QUINCKE YW (NEEDLE) IMPLANT
NS IRRIG 1000ML POUR BTL (IV SOLUTION) ×2 IMPLANT
PACK LAMINECTOMY NEURO (CUSTOM PROCEDURE TRAY) ×2 IMPLANT
PAD ARMBOARD 7.5X6 YLW CONV (MISCELLANEOUS) ×6 IMPLANT
PATTIES SURGICAL .5 X1 (DISPOSABLE) ×2 IMPLANT
PLASMABLADE 3.0S (MISCELLANEOUS) ×2
RUBBERBAND STERILE (MISCELLANEOUS) IMPLANT
SPONGE SURGIFOAM ABS GEL SZ50 (HEMOSTASIS) ×2 IMPLANT
SUT PROLENE 6 0 BV (SUTURE) ×6 IMPLANT
SUT VIC AB 1 CT1 18XBRD ANBCTR (SUTURE) ×1 IMPLANT
SUT VIC AB 1 CT1 8-18 (SUTURE) ×1
SUT VIC AB 2-0 CP2 18 (SUTURE) ×2 IMPLANT
SUT VIC AB 3-0 SH 8-18 (SUTURE) ×4 IMPLANT
TOWEL OR 17X24 6PK STRL BLUE (TOWEL DISPOSABLE) ×2 IMPLANT
TOWEL OR 17X26 10 PK STRL BLUE (TOWEL DISPOSABLE) ×2 IMPLANT
WATER STERILE IRR 1000ML POUR (IV SOLUTION) ×2 IMPLANT

## 2016-02-10 NOTE — Transfer of Care (Signed)
Immediate Anesthesia Transfer of Care Note  Patient: Kaylee Mullins  Procedure(s) Performed: Procedure(s) with comments: Lumbar three- four Laminectomy (N/A) - L3-4 Laminectomy  Patient Location: PACU  Anesthesia Type:General  Level of Consciousness: awake, alert , oriented and patient cooperative  Airway & Oxygen Therapy: Patient Spontanous Breathing and Patient connected to face mask oxygen  Post-op Assessment: Report given to RN, Post -op Vital signs reviewed and stable and Patient moving all extremities  Post vital signs: Reviewed and stable  Last Vitals: There were no vitals filed for this visit.  Last Pain:  Filed Vitals:   02/10/16 1356  PainSc: 8       Patients Stated Pain Goal: 5 ([id number])  Complications: No apparent anesthesia complications

## 2016-02-10 NOTE — Anesthesia Preprocedure Evaluation (Addendum)
Anesthesia Evaluation  Patient identified by MRN, date of birth, ID band Patient awake    Reviewed: Allergy & Precautions, NPO status , Patient's Chart, lab work & pertinent test results, reviewed documented beta blocker date and time   Airway Mallampati: II  TM Distance: >3 FB Neck ROM: Full    Dental  (+) Dental Advisory Given   Pulmonary neg pulmonary ROS,    breath sounds clear to auscultation       Cardiovascular hypertension, Pt. on medications and Pt. on home beta blockers  Rhythm:regular Rate:Normal  01/30/16 Echo: Study Conclusions - Left ventricle: The cavity size was normal. There was mild concentric hypertrophy. Systolic function was normal. The estimated ejection fraction was in the range of 55% to 60%. Wall motion was normal; there were no regional wall motion abnormalities. Features are consistent with a pseudonormal left ventricular filling pattern, with concomitant abnormal relaxation  and increased filling pressure (grade 2 diastolic dysfunction). - Aortic valve: There was trivial regurgitation. Mean gradient (S): 7 mm Hg. Peak gradient (S): 12 mm Hg. Valve area (VTI): 1.87 cm^2. Valve area (Vmax): 2.12 cm^2. Valve area (Vmean): 2 cm^2. - Left atrium: The atrium was mildly dilated.   02/02/16 Myoview without ischemia.   Neuro/Psych CVA, Residual Symptoms    GI/Hepatic negative GI ROS, Neg liver ROS,   Endo/Other  diabetes, Type 2, Insulin DependentHypothyroidism Morbid obesity  Renal/GU CRFRenal disease     Musculoskeletal  (+) Arthritis ,   Abdominal   Peds  Hematology negative hematology ROS (+)   Anesthesia Other Findings   Reproductive/Obstetrics                           Lab Results  Component Value Date   WBC 7.5 02/10/2016   HGB 12.9 02/10/2016   HCT 39.5 02/10/2016   MCV 93.8 02/10/2016   PLT 276 02/10/2016   Lab Results  Component Value Date   CREATININE 1.13*  02/10/2016   BUN 15 02/10/2016   NA 139 02/10/2016   K 4.2 02/10/2016   CL 105 02/10/2016   CO2 24 02/10/2016    Anesthesia Physical Anesthesia Plan  ASA: III  Anesthesia Plan: General   Post-op Pain Management:    Induction: Intravenous  Airway Management Planned: Oral ETT  Additional Equipment:   Intra-op Plan:   Post-operative Plan: Extubation in OR  Informed Consent: I have reviewed the patients History and Physical, chart, labs and discussed the procedure including the risks, benefits and alternatives for the proposed anesthesia with the patient or authorized representative who has indicated his/her understanding and acceptance.   Dental advisory given  Plan Discussed with: CRNA  Anesthesia Plan Comments:         Anesthesia Quick Evaluation

## 2016-02-10 NOTE — H&P (Signed)
CHIEF COMPLAINT:                                          Lower back pain and weakness in the legs.  HISTORY OF PRESENT ILLNESS:                     Kaylee Mullins is a 80 year old, right-handed, individual whom I have taken care of in the past. Back in 1997, I did a surgical decompression at L4-L5 with a Ray cage fusion. She did well after that surgery and has had some chronic low-grade back pain, but notes that it has gotten considerably worse here in the recent past. She finds that her ability to have stamina on her feet and walking any distance is limited mostly by pain into the back and radiating into the legs. This problem is getting so severe that she finds now that it is increasingly difficult to do activities of daily living, and it seems to be bothering her overall well being. She arranged this appointment to have further workup, and today in the office, we obtain a new lateral flexion and extension film plus a singular AP view. The radiographs demonstrate that her Ray cage fusion at L4-L5 is well healed and bone is nicely incorporated all around the cage. Her L3-L4 disc demonstrates marked degenerative change and a slight anterolisthesis. The alignment of her spine in the coronal projection on the AP view is quite normal. She has no evidence of any significant scoliotic features.  REVIEW OF SYSTEMS:                                    Systems review is notable for arm weakness, leg pain, back pain, arm pain, leg pain, joint pain and swelling, arthritis, and neck pain. She has a history of urinary tract infections, and her last x-ray dates back to January 2016. She has difficulty with coordination in her arms and legs and depression on occasion, diabetes, thyroid disease, increased appetite, excessive thirst and urination, ringing in the ears, balance disturbance, high blood pressure, swelling in the feet and hands, leg pain while walking all noted on a 14-point review sheet.   PAST  MEDICAL HISTORY:                                Her Past Medical History is significant for  . Current Medical Conditions:  A history of diabetes. She has some hypertension. She tells me also that she had a mild stroke sometime ago and currently is taking some Plavix.  . Prior Operations:  In addition, she has recently had cataract surgery.  . Medications and Allergies:  Her current medications include, in addition to the Plavix, Toprol, Diltiazem, Potassium, Micardis, Crestor, Lasix, Humalog insulin 50/50 mix taking 80 in the morning and 40 in the evening, and levothyroxine. SHE NOTES THAT SHE DOES NOT TOLERATE DARVON.  SOCIAL HISTORY:                                            Personal history reveals that she does not smoke or use alcohol and  her height and weight are 5 feet 4 inches and 242 pounds.  PHYSICAL EXAMINATION:                                On Physical Examination, I note that she stands straight and erect. She is able to flex forward some 60 degrees. She extends normally. Axial compression does not reproduce any pain. Palpation and percussion reproduces discomfort locally.   NEUROLOGICAL EXAMINATION:             . Motor Examination - Her motor function reveals intact function in the tibialis anterior and the gastrocs with toe and heel walking.  Marland Kitchen Deep Tendon Reflexes - She has absent reflexes in the patellae and the Achilles, both, and straight leg raising is positive at 15 degrees in either lower extremity.  IMPRESSION AND PLAN:      patient has evidence of significant stenosis at L3-L4 on the recently performed MRI in May of this year. She is advised regarding the need for surgical decompression at this level. We initially contemplated placing Coflex device at this level however being that it had not been approved she will undergo bilateral laminotomies and foraminotomies with decompression of the level of L3-L4.

## 2016-02-10 NOTE — Op Note (Signed)
Date of surgery: 02/10/2016 Preoperative diagnosis: Lumbar stenosis L34 with neurogenic claudication, lumbar radiculopathy Postoperative diagnosis: Lumbar stenosis L3-4 with neurogenic claudication lumbar radiculopathy Procedure: Bilateral laminotomies and foraminotomies L3-L4 Surgeon: Kristeen Miss First assistant: Jovita Gamma M.D. Anesthesia: Gen. endotracheal Indications: The patient is a 80 year old individual who's had significant back and bilateral lower extremity pain she's had a previous fusion at L4-L5 that I did years ago using a Ray cage technique she now has adjacent level disease with severe stenosis at the level of L3-L4. She is tried all manner of conservative management and because the pain is gotten progressively worse she desires some form of relief. She is now to have bilateral laminotomies and decompression at L3-L4.  Procedure: The patient was brought to the operating room supine on a stretcher. After the smooth induction of general endotracheal anesthesia, she was turned prone. The back was prepped with alcohol and DuraPrep and draped in a sterile fashion. Midline incision was created and carried down to the lumbar dorsal fascia. Fascia was opened on either side of the midline to expose the interspinous processes and space at L3-L4. This area was confirmed positively with the radiograph. A subperiosteal dissection was carried out towards laminar arch to include the facet joints at L3-L4. Then a high-speed drill was used to remove the inferior margin lamina of L3 out to and including the medial wall the facet. The dissection was carried out to expose and remove parts of the laminar arches and to decompress the thick and redundant yellow ligament. As ligament was lifted there was noted to be a portion that was attached and was rather calcific on the right side near the midline of the dura. Though careful dissection was performed a tear through the outer layer of dura had occurred in a  linear fashion. This was covered with a cottonoid while the laminectomy was then completed on both sides removing the yellow ligament and decompressing the dura laterally the L3 nerve root was compressed in its travel at the foramen and redundant yellow ligament was removed from this region the decompress the L3 nerve root fully L4 nerve root was decompressed similarly using a high-speed drill and 2 mm Kerrison punch to remove the thick and redundant ligament out to the path of the L4 nerve root. Once the nerve roots and the central canal was decompressed attention was then turned to this dural tear. The tear measured nearly 9 mm in length. 3 horizontal mattress sutures with 6-0 Prolene were used to close this tear. The calcified portion of the dura could ultimately be dissected as carefully as possible in the and a portion of the calcium with a portion of the dura was cut away prior to the closure itself. Some spinal fluid leakage did occur at the time of closure. Nonetheless after closing the dura with Valsalva to 40 with the patient then coughing and bucking did not yield any leaking. With this hemostasis was carefully achieved in all the aspects of the surgery blood loss is estimated 150 mL and lumbar dorsal fascia was closed with #1 Vicryl in interrupted fashion, 2-0 Vicryl was used in the subcutaneous anus tissues, 3-0 Vicryl was used to close subcuticular skin. Patient tolerated procedure well is returned to recovery room in stable condition.

## 2016-02-10 NOTE — Anesthesia Procedure Notes (Signed)
Procedure Name: Intubation Date/Time: 02/10/2016 4:18 PM Performed by: Greggory Stallion, Kaysie Michelini L Pre-anesthesia Checklist: Patient identified, Emergency Drugs available, Suction available and Patient being monitored Patient Re-evaluated:Patient Re-evaluated prior to inductionOxygen Delivery Method: Circle System Utilized Preoxygenation: Pre-oxygenation with 100% oxygen Intubation Type: IV induction Ventilation: Mask ventilation without difficulty Laryngoscope Size: Mac and 3 Tube type: Oral Tube size: 7.5 mm Number of attempts: 1 Airway Equipment and Method: Stylet Placement Confirmation: ETT inserted through vocal cords under direct vision,  positive ETCO2 and breath sounds checked- equal and bilateral Secured at: 21 cm Tube secured with: Tape Dental Injury: Teeth and Oropharynx as per pre-operative assessment

## 2016-02-10 NOTE — Anesthesia Postprocedure Evaluation (Signed)
Anesthesia Post Note  Patient: Kaylee Mullins  Procedure(s) Performed: Procedure(s) (LRB): Lumbar three- four Laminectomy (N/A)  Patient location during evaluation: PACU Anesthesia Type: General Level of consciousness: awake and alert Pain management: pain level controlled Vital Signs Assessment: post-procedure vital signs reviewed and stable Respiratory status: spontaneous breathing, nonlabored ventilation and respiratory function stable Cardiovascular status: blood pressure returned to baseline and stable Postop Assessment: no signs of nausea or vomiting Anesthetic complications: no    Last Vitals:  Filed Vitals:   02/10/16 2010 02/10/16 2015  BP: 145/54   Pulse: 48 50  Temp:    Resp: 25 28    Last Pain:  Filed Vitals:   02/10/16 2023  PainSc: 7                  Genavie Boettger,W. EDMOND

## 2016-02-10 NOTE — Progress Notes (Signed)
Patient ID: Kaylee Mullins, female   DOB: 1936-05-01, 80 y.o.   MRN: [id number] Vital signs are stable Motor function appears intact in lower extremities Patient complains of modest back pain We'll mobilize as tolerated when alert

## 2016-02-10 NOTE — Consult Note (Signed)
[facility] Medical Consultation  Kaylee Mullins [id number] DOB: 10/27/1935 DOA: 02/10/2016 PCP: Philis Fendt, MD   Requesting physician: Kristeen Miss, M.D. Date of consultation: 02/10/2016. Reason for consultation: Medical management.  Impression/Recommendations Principal Problem:   Lumbar stenosis with neurogenic claudication Continue pain management and postop care as per Dr. Ellene Route.  Active Problems:   Diabetes mellitus (HCC) Carbohydrate modified diet. CBG monitoring with regular insulin sliding scale.    History of CVA (cerebral vascular accident) ([place]) Resume Plavix once it is cleared by neurosurgery.    Hyperlipidemia Continue Crestor 20 mg by mouth daily. Monitor LFTs.    Hypothyroidism Continue levothyroxine 200 g by mouth daily. Monitor TSH.     Essential hypertension. Continue metoprolol 100 mg by mouth twice a day. Continue Avapro 300 mg by mouth daily. Continue HCTZ 25 mg by mouth daily. Monitor blood pressure periodically.   I will followup again tomorrow. Please contact me if I can be of assistance in the meanwhile. Thank you for this consultation.  Chief Complaint: Medical management.  HPI: 80 year old female with a past medical history hypertension, hypothyroidism, type 2 diabetes, spinal stenosis who underwent bilateral laminotomies and foraminotomies of L3-L4 by Dr. Kristeen Miss and we are being consulted for medical management. Patient is currently in no acute distress and states that her pain is well managed. She denies any recent headache, chest pain, palpitations, dizziness, diaphoresis, dyspnea, productive cough, abdominal pain, diarrhea, constipation or GU symptoms.   Review of Systems:  As mentioned on the history of present illness otherwise 10 point review of systems negative.   Past Medical History  Diagnosis Date  . Hypertension   . Hypothyroidism   . Diabetes mellitus     type !  . Cancer ([place])     thyroid cancer   . Spinal stenosis   . Stroke ([place]) 09/2014    left sided weakness  . Chronic kidney disease     questionable mass on kidney. Being followed by Dr Diona Fanti  . Arthritis     knees  . Full dentures    Past Surgical History  Procedure Laterality Date  . Thyroidectomy  2005  . Abdominal hysterectomy      partial  . Tonsillectomy    . Lumbar spine surgery    . Flexible sigmoidoscopy  03/29/2012    Procedure: FLEXIBLE SIGMOIDOSCOPY;  Surgeon: Jeryl Columbia, MD;  Location: [facility] ENDOSCOPY;  Service: Endoscopy;  Laterality: N/A;  fleet enema upon arrival  . Hot hemostasis  03/29/2012    Procedure: HOT HEMOSTASIS (ARGON PLASMA COAGULATION/BICAP);  Surgeon: Jeryl Columbia, MD;  Location: [facility] ENDOSCOPY;  Service: Endoscopy;  Laterality: N/A;  . Cataracts      Removed  11/2015  bilateral   Social History:  reports that she has never smoked. She does not have any smokeless tobacco history on file. She reports that she does not drink alcohol or use illicit drugs.  Allergies  Allergen Reactions  . Darvon [Propoxyphene Hcl] Other (See Comments)    hallucinations  . Nyquil Multi-Symptom [Pseudoeph-Doxylamine-Dm-Apap] Other (See Comments)    Makes pt not "feel right in her head"   Family History  Problem Relation Age of Onset  . Heart failure Mother   . Heart attack Father     Prior to Admission medications   Medication Sig Start Date End Date Taking? Authorizing Provider  clopidogrel (PLAVIX) 75 MG tablet Take 1 tablet (75 mg total) by mouth daily. 09/18/14  Yes Costin Karlyne Greenspan, MD  [facility] NON-ASPIRIN EXTRA STRENGTH 500 MG tablet TAKE 1 TO 2 TABLETS BY MOUTH TWICE A DAY AS NEEDED PAIN 06/28/15  Yes Historical Provider, MD  diltiazem (TIAZAC) 360 MG 24 hr capsule Take 360 mg by mouth daily. Reported on 09/19/2015   Yes Historical Provider, MD  furosemide (LASIX) 40 MG tablet TAKE 1 TABLET BY MOUTH EVERY DAY AS NEEDED FOR LEG SWELLING 05/28/15  Yes Historical Provider, MD  HUMALOG MIX 50/50 KWIKPEN (50-50)  100 UNIT/ML Kwikpen INJECT 90 UNITS BEFORE BREAKFAST AND 60 UNITS BEFORE EVENING MEAL 12/03/15  Yes Historical Provider, MD  levothyroxine (SYNTHROID, LEVOTHROID) 200 MCG tablet Take 200 mcg by mouth daily. 11/11/15  Yes Historical Provider, MD  metoprolol succinate (TOPROL-XL) 100 MG 24 hr tablet Take 100 mg by mouth 2 (two) times daily. Take with or immediately following a meal.   Yes Historical Provider, MD  oxyCODONE-acetaminophen (PERCOCET/ROXICET) 5-325 MG tablet Take 1-2 tablets by mouth every 8 (eight) hours as needed for severe pain. [id number]  Yes Delora Fuel, MD  rosuvastatin (CRESTOR) 20 MG tablet Take 20 mg by mouth daily.   Yes Historical Provider, MD  telmisartan-hydrochlorothiazide (MICARDIS HCT) 80-25 MG tablet Take 1 tablet by mouth daily.  01/15/16  Yes Historical Provider, MD   Physical Exam: Blood pressure 145/54, pulse 50, temperature 98 F (36.7 C), resp. rate 28, height 5\' 4"  (1.626 m), weight 108.41 kg (239 lb), SpO2 97 %. Filed Vitals:   02/10/16 2010 02/10/16 2015  BP: 145/54   Pulse: 48 50  Temp:    Resp: 25 28     General: Afebrile and in no acute distress.  Eyes: PERRL, normal lids and conjunctiva.  ENT: Oral mucosa was moist 0 is clear.  Neck: Supple, no JVD.  Cardiovascular: S1-S2, RRR.  Respiratory: Clear to auscultation on anterior exam.  Abdomen: BS positive, soft, nontender.  Skin: No rashes on limited skin exam.  Musculoskeletal: Normal muscle tone.  Psychiatric: Awake, alert, oriented 4, normal mood and affect.  Neurologic: Moves all extremities and sensation seems to be intact.  Labs on Admission:  Basic Metabolic Panel:  Recent Labs Lab 02/10/16 1356  NA 139  K 4.2  CL 105  CO2 24  GLUCOSE 212*  BUN 15  CREATININE 1.13*  CALCIUM 9.4   CBC:  Recent Labs Lab 02/10/16 1356  WBC 7.5  HGB 12.9  HCT 39.5  MCV 93.8  PLT 276   CBG:  Recent Labs Lab 02/10/16 1345 02/10/16 1854  GLUCAP 200* 165*    Radiological Exams  on Admission: Dg Lumbar Spine 2-3 Views  02/10/2016  CLINICAL DATA:  L3-4 laminectomy. EXAM: LUMBAR SPINE - 2-3 VIEW COMPARISON:  12/18/2015 FINDINGS: Examination demonstrates stable subtle grade 1 anterolisthesis of L3 on L4 with moderate facet arthropathy over the mid to lower lumbar spine. Mild spondylosis is present. Intervertebral cages present at the L4-5 level. Disc space narrowing at the L3-4 level. A metallic needle is present with tip projected over the superior aspect of the L4 spinous process. Subsequent film demonstrates surgical instrument with tip over the posterior elements at the L3-4 level. IMPRESSION: Mild spondylosis of the lumbar spine with stable grade 1 anterolisthesis of L3 on L4. Disc space narrowing at the L3-4 level and intervertebral cage at the L4-5 level. Surgical instrument over the posterior elements at the L3-4 level. Electronically Signed   By: Marin Olp M.D.   On: 02/10/2016 18:45    Time spent: About 50 minutes were spent during the process of  this consultation.  Reubin Milan, MD [facility] Pager [phone].  If 7PM-7AM, please contact night-coverage [email or web address] Password [facility] 02/10/2016, 8:46 PM

## 2016-02-11 ENCOUNTER — Encounter (HOSPITAL_COMMUNITY): Payer: Self-pay | Admitting: Neurological Surgery

## 2016-02-11 DIAGNOSIS — I63139 Cerebral infarction due to embolism of unspecified carotid artery: Secondary | ICD-10-CM

## 2016-02-11 DIAGNOSIS — I10 Essential (primary) hypertension: Secondary | ICD-10-CM | POA: Diagnosis present

## 2016-02-11 DIAGNOSIS — M4806 Spinal stenosis, lumbar region: Secondary | ICD-10-CM | POA: Diagnosis not present

## 2016-02-11 DIAGNOSIS — E0800 Diabetes mellitus due to underlying condition with hyperosmolarity without nonketotic hyperglycemic-hyperosmolar coma (NKHHC): Secondary | ICD-10-CM

## 2016-02-11 DIAGNOSIS — E785 Hyperlipidemia, unspecified: Secondary | ICD-10-CM

## 2016-02-11 LAB — GLUCOSE, CAPILLARY
Glucose-Capillary: 202 mg/dL — ABNORMAL HIGH (ref 65–99)
Glucose-Capillary: 223 mg/dL — ABNORMAL HIGH (ref 65–99)
Glucose-Capillary: 245 mg/dL — ABNORMAL HIGH (ref 65–99)
Glucose-Capillary: 126 mg/dL — ABNORMAL HIGH (ref 65–99)

## 2016-02-11 MED ORDER — INSULIN ASPART PROT & ASPART (70-30 MIX) 100 UNIT/ML ~~LOC~~ SUSP
30.0000 [IU] | Freq: Two times a day (BID) | SUBCUTANEOUS | Status: DC
  Administered 2016-02-11 – 2016-02-12 (×3): 30 [IU] via SUBCUTANEOUS
  Filled 2016-02-11: qty 10

## 2016-02-11 MED ORDER — HYDRALAZINE HCL 25 MG PO TABS
25.0000 mg | ORAL_TABLET | Freq: Four times a day (QID) | ORAL | Status: DC | PRN
  Administered 2016-02-11: 25 mg via ORAL
  Filled 2016-02-11 (×2): qty 1

## 2016-02-11 MED ORDER — KETOROLAC TROMETHAMINE 15 MG/ML IJ SOLN
15.0000 mg | Freq: Four times a day (QID) | INTRAMUSCULAR | Status: AC
  Administered 2016-02-11 – 2016-02-12 (×5): 15 mg via INTRAVENOUS
  Filled 2016-02-11 (×5): qty 1

## 2016-02-11 NOTE — Progress Notes (Signed)
Patient ID: Kaylee Mullins, female   DOB: [birth year], 80 y.o.   MRN: [id number] I will signs are stable Blood sugars are elevated and low 200s Patient is feeling better lower extremities are working well Incision is clean and dry Patient will be ambulated We'll likely need placement in skilled nursing facility as she does live alone

## 2016-02-11 NOTE — Progress Notes (Signed)
Inpatient Diabetes Program Recommendations  AACE/ADA: New Consensus Statement on Inpatient Glycemic Control (2015)  Target Ranges:  Prepandial:   less than 140 mg/dL      Peak postprandial:   less than 180 mg/dL (1-2 hours)      Critically ill patients:  140 - 180 mg/dL   Lab Results  Component Value Date   GLUCAP 202* 02/11/2016   HGBA1C 9.7* 01/22/2016    Review of Glycemic Control  Diabetes history: DM2 Outpatient Diabetes medications: Humalog Mix 50/50 90 units ac breakfast & 60 units ac dinner Current orders for Inpatient glycemic control: 70/30 30 units bid with meals + Novolog correction 0-15 units tid + 0-5 units hs  Inpatient Diabetes Program Recommendations:  Spoke with patient concerning elevated A1c of 9.7. Patient states she needs to get back to exercising at the [facility] and watching her carbohydrates. Gave patient handouts and reviewed normal A1c levels, basic plate method, and guide to healthy living. Patient states willingness to improve her health. Nurses, please assist patient in selecting DM videos.  Thank you, Nani Gasser. Ossie Beltran, RN, MSN, CDE Inpatient Glycemic Control Team Team Pager [phone] (8am-5pm) 02/11/2016 9:37 AM

## 2016-02-11 NOTE — Evaluation (Signed)
Physical Therapy Evaluation Patient Details Name: Kaylee Mullins MRN: [id number] DOB: 1936/02/24 Today's Date: 02/11/2016   History of Present Illness  This 80 yo female s/p Bilateral laminotomies and foraminotomies L3-L4  Clinical Impression  Pt admitted with above diagnosis. Pt currently with functional limitations due to the deficits listed below (see PT Problem List). At the time of PT eval pt was able to perform transfers and ambulation with hands-on guarding at all times for balance support and safety. Pt moving slow overall and requires increased cues for maintenance of precautions. Pt will benefit from skilled PT to increase their independence and safety with mobility to allow discharge to the venue listed below.       Follow Up Recommendations SNF;Supervision/Assistance - 24 hour    Equipment Recommendations  None recommended by PT (TBD by next venue of care)    Recommendations for Other Services       Precautions / Restrictions Precautions Precautions: Back Precaution Booklet Issued: Yes (comment) Required Braces or Orthoses: Spinal Brace Spinal Brace: Lumbar corset Restrictions Weight Bearing Restrictions: No      Mobility  Bed Mobility Overal bed mobility: Needs Assistance Bed Mobility: Sidelying to Sit   Sidelying to sit: Supervision       General bed mobility comments: Use of rails required. VC's for proper technique to maintain back precautions. Pt able to transition to EOB without assist however form was poor.   Transfers Overall transfer level: Needs assistance Equipment used: Rolling walker (2 wheeled) Transfers: Sit to/from Stand Sit to Stand: Min guard         General transfer comment: VCs for safe hand placement on seated surface. Pt demonstrating poor safety awareness and attempting to stand without the walker in front of her.   Ambulation/Gait Ambulation/Gait assistance: Min guard Ambulation Distance (Feet): 175 Feet Assistive device:  Rolling walker (2 wheeled) Gait Pattern/deviations: Step-through pattern;Decreased stride length;Trunk flexed;Trendelenburg Gait velocity: Decreased Gait velocity interpretation: Below normal speed for age/gender General Gait Details: VC's for sequencing and safety with the RW. Pt fatigued fairly quickly however was motivated for distance. 2 standing rest breaks required.   Stairs            Wheelchair Mobility    Modified Rankin (Stroke Patients Only)       Balance Overall balance assessment: Needs assistance Sitting-balance support: Feet supported;No upper extremity supported Sitting balance-Leahy Scale: Fair     Standing balance support: Bilateral upper extremity supported;During functional activity Standing balance-Leahy Scale: Poor Standing balance comment: Reliant on RW. Poor safety awareness.                              Pertinent Vitals/Pain Pain Assessment: Faces Faces Pain Scale: Hurts little more Pain Location: Incision site Pain Descriptors / Indicators: Operative site guarding;Sore Pain Intervention(s): Limited activity within patient's tolerance;Monitored during session;Repositioned    Home Living Family/patient expects to be discharged to:: Inpatient rehab Living Arrangements: Alone Available Help at Discharge: Family;Available PRN/intermittently Type of Home: House Home Access: Stairs to enter Entrance Stairs-Rails: None Entrance Stairs-Number of Steps: 1 Home Layout: Two level Home Equipment: Cane - single point;Shower seat - built in      Prior Function Level of Independence: Independent         Comments: Drives, does some grocery shopping (sometimes with cart and sometimes with motorized cart), does her cooking and cleaning     Hand Dominance   Dominant Hand: Right  Extremity/Trunk Assessment   Upper Extremity Assessment: Defer to OT evaluation           Lower Extremity Assessment: Generalized weakness       Cervical / Trunk Assessment: Kyphotic (Forward head posture)  Communication   Communication: No difficulties  Cognition Arousal/Alertness: Awake/alert Behavior During Therapy: WFL for tasks assessed/performed Overall Cognitive Status: Within Functional Limits for tasks assessed                      General Comments      Exercises        Assessment/Plan    PT Assessment Patient needs continued PT services  PT Diagnosis Difficulty walking;Acute pain   PT Problem List Decreased strength;Decreased range of motion;Decreased activity tolerance;Decreased balance;Decreased mobility;Decreased knowledge of use of DME;Decreased safety awareness;Decreased knowledge of precautions;Pain  PT Treatment Interventions DME instruction;Gait training;Stair training;Functional mobility training;Therapeutic activities;Therapeutic exercise;Neuromuscular re-education;Patient/family education   PT Goals (Current goals can be found in the Care Plan section) Acute Rehab PT Goals Patient Stated Goal: to go to inpatient rehab here PT Goal Formulation: With patient Time For Goal Achievement: 02/25/16 Potential to Achieve Goals: Good    Frequency Min 5X/week   Barriers to discharge Decreased caregiver support Pt lives alone    Co-evaluation               End of Session Equipment Utilized During Treatment: Back brace Activity Tolerance: Patient limited by fatigue Patient left: with call bell/phone within reach (Sitting EOB for lunch) Nurse Communication: Mobility status    Functional Assessment Tool Used: Clinical judgement Functional Limitation: Mobility: Walking and moving around Mobility: Walking and Moving Around Current Status [id number]): At least 1 percent but less than 20 percent impaired, limited or restricted Mobility: Walking and Moving Around Goal Status [phone]): At least 1 percent but less than 20 percent impaired, limited or restricted    Time: 1146-1204 PT Time  Calculation (min) (ACUTE ONLY): 18 min   Charges:   PT Evaluation $PT Eval Moderate Complexity: 1 Procedure     PT G Codes:   PT G-Codes **NOT FOR INPATIENT CLASS** Functional Assessment Tool Used: Clinical judgement Functional Limitation: Mobility: Walking and moving around Mobility: Walking and Moving Around Current Status [id number]): At least 1 percent but less than 20 percent impaired, limited or restricted Mobility: Walking and Moving Around Goal Status [phone]): At least 1 percent but less than 20 percent impaired, limited or restricted    Rolinda Roan 02/11/2016, 1:35 PM   Rolinda Roan, PT, DPT Acute Rehabilitation Services Pager: [phone]

## 2016-02-11 NOTE — Progress Notes (Signed)
[facility] PROGRESS NOTE  MARGARET STOBIE [id number] DOB: [birth year] DOA: 02/10/2016   PCP: Philis Fendt, MD     Assessment/Plan: Principal Problem:   Lumbar stenosis with neurogenic claudication Active Problems:   Diabetes mellitus ([place])   History of CVA (cerebral vascular accident) ([place])   Hyperlipidemia   Hypothyroidism   Essential hypertension    80 year old female with a past medical history hypertension, hypothyroidism, type 2 diabetes, spinal stenosis who underwent bilateral laminotomies and foraminotomies of L3-L4 by Dr. Kristeen Miss and we are being consulted for medical management. Patient is currently in no acute distress and states that her pain is well managed.    Assessment and plan Lumbar stenosis with neurogenic claudication Continue pain management and postop care as per Dr. Ellene Route.     Diabetes mellitus (HCC)-hemoglobin A1c 9.7 Carbohydrate modified diet. CBG monitoring with regular insulin sliding scale. Patient takes Humalog 50/50, 90 units in the morning and 60 units in the evening, this type of insulin is nonformulary Will initiate 70/30, 30 units twice a day   History of CVA (cerebral vascular accident) ([place]) Resume Plavix once it is cleared by neurosurgery.   Hyperlipidemia Continue Crestor 20 mg by mouth daily. Monitor LFTs.   Hypothyroidism Continue levothyroxine 200 g by mouth daily. Monitor TSH.   Essential hypertension. Somewhat uncontrolled postsurgery Continue metoprolol 100 mg by mouth twice a day. Continue Avapro 300 mg by mouth daily. Continue HCTZ 25 mg by mouth daily. Prn hydralazine    Code Status:  Full code  Family Communication: Discussed in detail with the patient, all imaging results, lab results explained to the patient   Disposition Plan:  Per neurosurgery      Consultants:  [facility]  Procedures:  *None  Antibiotics: Anti-infectives    Start     Dose/Rate Route  Frequency Ordered Stop   02/11/16 0000  ceFAZolin (ANCEF) IVPB 1 g/50 mL premix     1 g 100 mL/hr over 30 Minutes Intravenous Every 8 hours 02/10/16 2046 02/11/16 0854       HPI/Subjective:  Hypertensive overnight, Continues to have some pain in her back, she is ambulatory with a walker  Objective: Filed Vitals:   02/10/16 2058 02/10/16 2250 02/11/16 0333 02/11/16 0822  BP: 171/52 147/58 153/50 153/69  Pulse: 46 51 63 69  Temp: 97.5 F (36.4 C) 97.5 F (36.4 C) 97.5 F (36.4 C) 98.5 F (36.9 C)  TempSrc: Oral Axillary Oral   Resp: 20 20 18 18   Height:      Weight:      SpO2: 97% 97% 95% 97%    Intake/Output Summary (Last 24 hours) at 02/11/16 0855 Last data filed at 02/10/16 2000  Gross per 24 hour  Intake   1850 ml  Output    150 ml  Net   1700 ml    Exam:  Examination:  General exam: Appears calm and comfortable  Respiratory system: Clear to auscultation. Respiratory effort normal. Cardiovascular system: S1 & S2 heard, RRR. No JVD, murmurs, rubs, gallops or clicks. No pedal edema. Gastrointestinal system: Abdomen is nondistended, soft and nontender. No organomegaly or masses felt. Normal bowel sounds heard. Central nervous system: Alert and oriented. No focal neurological deficits. Extremities: Symmetric 5 x 5 power. Skin: No rashes, lesions or ulcers Psychiatry: Judgement and insight appear normal. Mood & affect appropriate.     Data Reviewed: I have personally reviewed following labs and imaging studies  Micro Results No results  found for this or any previous visit (from the past 240 hour(s)).  Radiology Reports Dg Lumbar Spine 2-3 Views  02/10/2016  CLINICAL DATA:  L3-4 laminectomy. EXAM: LUMBAR SPINE - 2-3 VIEW COMPARISON:  12/18/2015 FINDINGS: Examination demonstrates stable subtle grade 1 anterolisthesis of L3 on L4 with moderate facet arthropathy over the mid to lower lumbar spine. Mild spondylosis is present. Intervertebral cages present at the L4-5  level. Disc space narrowing at the L3-4 level. A metallic needle is present with tip projected over the superior aspect of the L4 spinous process. Subsequent film demonstrates surgical instrument with tip over the posterior elements at the L3-4 level. IMPRESSION: Mild spondylosis of the lumbar spine with stable grade 1 anterolisthesis of L3 on L4. Disc space narrowing at the L3-4 level and intervertebral cage at the L4-5 level. Surgical instrument over the posterior elements at the L3-4 level. Electronically Signed   By: Marin Olp M.D.   On: 02/10/2016 18:45     CBC  Recent Labs Lab 02/10/16 1356  WBC 7.5  HGB 12.9  HCT 39.5  PLT 276  MCV 93.8  MCH 30.6  MCHC 32.7  RDW 14.0    Chemistries   Recent Labs Lab 02/10/16 1356  NA 139  K 4.2  CL 105  CO2 24  GLUCOSE 212*  BUN 15  CREATININE 1.13*  CALCIUM 9.4   ------------------------------------------------------------------------------------------------------------------ estimated creatinine clearance is 50.2 mL/min (by C-G formula based on Cr of 1.13). ------------------------------------------------------------------------------------------------------------------ No results for input(s): HGBA1C in the last 72 hours. ------------------------------------------------------------------------------------------------------------------ No results for input(s): CHOL, HDL, LDLCALC, TRIG, CHOLHDL, LDLDIRECT in the last 72 hours. ------------------------------------------------------------------------------------------------------------------ No results for input(s): TSH, T4TOTAL, T3FREE, THYROIDAB in the last 72 hours.  Invalid input(s): FREET3 ------------------------------------------------------------------------------------------------------------------ No results for input(s): VITAMINB12, FOLATE, FERRITIN, TIBC, IRON, RETICCTPCT in the last 72 hours.  Coagulation profile No results for input(s): INR, PROTIME in the last  168 hours.  No results for input(s): DDIMER in the last 72 hours.  Cardiac Enzymes No results for input(s): CKMB, TROPONINI, MYOGLOBIN in the last 168 hours.  Invalid input(s): CK ------------------------------------------------------------------------------------------------------------------ Invalid input(s): POCBNP   CBG:  Recent Labs Lab 02/10/16 1345 02/10/16 1854 02/10/16 2247 02/11/16 0818  GLUCAP 200* 165* 210* 202*       Studies: Dg Lumbar Spine 2-3 Views  02/10/2016  CLINICAL DATA:  L3-4 laminectomy. EXAM: LUMBAR SPINE - 2-3 VIEW COMPARISON:  12/18/2015 FINDINGS: Examination demonstrates stable subtle grade 1 anterolisthesis of L3 on L4 with moderate facet arthropathy over the mid to lower lumbar spine. Mild spondylosis is present. Intervertebral cages present at the L4-5 level. Disc space narrowing at the L3-4 level. A metallic needle is present with tip projected over the superior aspect of the L4 spinous process. Subsequent film demonstrates surgical instrument with tip over the posterior elements at the L3-4 level. IMPRESSION: Mild spondylosis of the lumbar spine with stable grade 1 anterolisthesis of L3 on L4. Disc space narrowing at the L3-4 level and intervertebral cage at the L4-5 level. Surgical instrument over the posterior elements at the L3-4 level. Electronically Signed   By: Marin Olp M.D.   On: 02/10/2016 18:45      Lab Results  Component Value Date   HGBA1C 9.7* 01/22/2016   HGBA1C 8.9* 09/13/2014   HGBA1C 8.6* 09/12/2014   Lab Results  Component Value Date   LDLCALC 73 09/13/2014   CREATININE 1.13* 02/10/2016       Scheduled Meds: . diltiazem  360 mg Oral Daily  .  docusate sodium  100 mg Oral BID  . furosemide  40 mg Oral Daily  . irbesartan  300 mg Oral Daily   And  . hydrochlorothiazide  25 mg Oral Daily  . insulin aspart  0-15 Units Subcutaneous TID WC  . insulin aspart  0-5 Units Subcutaneous QHS  . ketorolac  15 mg  Intravenous Q6H  . levothyroxine  200 mcg Oral QAC breakfast  . metoprolol succinate  100 mg Oral BID  . rosuvastatin  20 mg Oral q1800  . senna  1 tablet Oral BID  . sodium chloride flush  3 mL Intravenous Q12H   Continuous Infusions: . sodium chloride       LOS: 1 day    Time spent: >30 MINS    [facility]  [facility] Pager [phone]. If 7PM-7AM, please contact night-coverage at [email or web address], password [facility] 02/11/2016, 8:55 AM  LOS: 1 day

## 2016-02-11 NOTE — Evaluation (Signed)
Occupational Therapy Evaluation Patient Details Name: Kaylee Mullins MRN: [id number] DOB: September 08, 1935 Today's Date: 02/11/2016    History of Present Illness This 80 yo female s/p Bilateral laminotomies and foraminotomies L3-L4   Clinical Impression   This 80 up female admitted and underwent above presents to acute OT with deficits below (see OT problem list) thus affecting her PLOF of Independent to Mod I. She will benefit from acute OT with followup OT at SNF to get to a Mod I to Independent level.    Follow Up Recommendations  SNF;Other (comment) (spoke to one of the rehab CM and due to decreased medical complexity, pts insurance will more than like deny an inpatient rehab stay here; pt does not want [facility] (her mom passed away there))    Equipment Recommendations  Other (comment)       Precautions / Restrictions Precautions Precautions: Back Precaution Booklet Issued: Yes (comment) Required Braces or Orthoses: Spinal Brace Spinal Brace: Lumbar corset Restrictions Weight Bearing Restrictions: No      Mobility Bed Mobility               General bed mobility comments: Pt up on EOB upon my arrival  Transfers Overall transfer level: Needs assistance Equipment used: Rolling walker (2 wheeled) Transfers: Sit to/from Stand Sit to Stand: Min guard         General transfer comment: VCs for safe hand placement    Balance Overall balance assessment: Needs assistance Sitting-balance support: No upper extremity supported;Feet supported Sitting balance-Leahy Scale: Fair     Standing balance support: Bilateral upper extremity supported;During functional activity Standing balance-Leahy Scale: Poor Standing balance comment: reliant on RW                            ADL Overall ADL's : Needs assistance/impaired Eating/Feeding: Independent;Sitting   Grooming: Set up;Sitting   Upper Body Bathing: Set up;Sitting   Lower Body Bathing: Moderate  assistance (min guard A sit<>stand)   Upper Body Dressing : Minimal assistance;Sitting   Lower Body Dressing: Maximal assistance (min guard A sit<>stand)   Toilet Transfer: Min guard;Ambulation;RW (bed>around to recliner)   Toileting- Clothing Manipulation and Hygiene: Min guard;Sit to/from stand                         Pertinent Vitals/Pain Pain Assessment: 0-10 Pain Score:  (12) Pain Location: incisional sitte Pain Descriptors / Indicators: Sore;Aching;Grimacing;Guarding Pain Intervention(s): Limited activity within patient's tolerance;Monitored during session;Repositioned (pt got po pain meds right before I entered room)     Hand Dominance Right   Extremity/Trunk Assessment Upper Extremity Assessment Upper Extremity Assessment: Overall WFL for tasks assessed   Lower Extremity Assessment Lower Extremity Assessment: Defer to PT evaluation   Cervical / Trunk Assessment Cervical / Trunk Assessment: Kyphotic   Communication Communication Communication: No difficulties   Cognition Arousal/Alertness: Awake/alert Behavior During Therapy: WFL for tasks assessed/performed Overall Cognitive Status: Within Functional Limits for tasks assessed                                Home Living   Living Arrangements: Alone Available Help at Discharge: Family;Available PRN/intermittently Type of Home: House Home Access: Stairs to enter Entrance Stairs-Number of Steps: 1 Entrance Stairs-Rails: None Home Layout: Two level Alternate Level Stairs-Number of Steps: flight --has stair lift   Bathroom Shower/Tub: Hospital doctor  Toilet: Standard     Home Equipment: Cane - single point;Shower seat - built in          Prior Functioning/Environment Level of Independence: Independent        Comments: Drives, does some grocery shopping (sometimes with cart and sometimes with motorized cart), does her cooking and cleaning    OT Diagnosis: Generalized  weakness;Acute pain   OT Problem List: Decreased strength;Decreased activity tolerance;Impaired balance (sitting and/or standing);Pain;Obesity;Decreased knowledge of precautions;Decreased knowledge of use of DME or AE   OT Treatment/Interventions: Self-care/ADL training;Patient/family education;Balance training;Therapeutic activities;DME and/or AE instruction    OT Goals(Current goals can be found in the care plan section) Acute Rehab OT Goals Patient Stated Goal: to go to inpatient rehab here OT Goal Formulation: With patient Time For Goal Achievement: 02/18/16 Potential to Achieve Goals: Good  OT Frequency: Min 2X/week   Barriers to D/C: Decreased caregiver support             End of Session Equipment Utilized During Treatment: Rolling walker Nurse Communication: Mobility status (D/C recommendations)  Activity Tolerance: Patient limited by pain Patient left: in chair;with call bell/phone within reach   Time: 0825-0854 OT Time Calculation (min): 29 min Charges:  OT General Charges $OT Visit: 1 Procedure OT Evaluation $OT Eval Moderate Complexity: 1 Procedure OT Treatments $Self Care/Home Management : 8-22 mins  Almon Register [id number] 02/11/2016, 9:26 AM

## 2016-02-12 ENCOUNTER — Observation Stay (HOSPITAL_COMMUNITY): Payer: Medicare Other

## 2016-02-12 DIAGNOSIS — E0800 Diabetes mellitus due to underlying condition with hyperosmolarity without nonketotic hyperglycemic-hyperosmolar coma (NKHHC): Secondary | ICD-10-CM | POA: Diagnosis not present

## 2016-02-12 DIAGNOSIS — M4806 Spinal stenosis, lumbar region: Secondary | ICD-10-CM | POA: Diagnosis not present

## 2016-02-12 LAB — COMPREHENSIVE METABOLIC PANEL
ALT: 88 U/L — ABNORMAL HIGH (ref 14–54)
AST: 183 U/L — ABNORMAL HIGH (ref 15–41)
Albumin: 3.1 g/dL — ABNORMAL LOW (ref 3.5–5.0)
Alkaline Phosphatase: 82 U/L (ref 38–126)
Anion gap: 7 (ref 5–15)
BUN: 18 mg/dL (ref 6–20)
CO2: 27 mmol/L (ref 22–32)
Calcium: 8.5 mg/dL — ABNORMAL LOW (ref 8.9–10.3)
Chloride: 102 mmol/L (ref 101–111)
Creatinine, Ser: 1.06 mg/dL — ABNORMAL HIGH (ref 0.44–1.00)
GFR calc Af Amer: 57 mL/min — ABNORMAL LOW (ref >60)
GFR calc non Af Amer: 49 mL/min — ABNORMAL LOW (ref >60)
Glucose, Bld: 252 mg/dL — ABNORMAL HIGH (ref 65–99)
Potassium: 3.6 mmol/L (ref 3.5–5.1)
Sodium: 136 mmol/L (ref 135–145)
Total Bilirubin: 1.2 mg/dL (ref 0.3–1.2)
Total Protein: 6.3 g/dL — ABNORMAL LOW (ref 6.5–8.1)

## 2016-02-12 LAB — GLUCOSE, CAPILLARY
Glucose-Capillary: 179 mg/dL — ABNORMAL HIGH (ref 65–99)
Glucose-Capillary: 230 mg/dL — ABNORMAL HIGH (ref 65–99)
Glucose-Capillary: 181 mg/dL — ABNORMAL HIGH (ref 65–99)
Glucose-Capillary: 137 mg/dL — ABNORMAL HIGH (ref 65–99)

## 2016-02-12 LAB — URINALYSIS, ROUTINE W REFLEX MICROSCOPIC
Bilirubin Urine: NEGATIVE
Glucose, UA: NEGATIVE mg/dL
Hgb urine dipstick: NEGATIVE
Ketones, ur: NEGATIVE mg/dL
Leukocytes, UA: NEGATIVE
Nitrite: NEGATIVE
Protein, ur: NEGATIVE mg/dL
Specific Gravity, Urine: 1.02 (ref 1.005–1.030)
pH: 5 (ref 5.0–8.0)

## 2016-02-12 LAB — TSH: TSH: 0.966 u[IU]/mL (ref 0.350–4.500)

## 2016-02-12 LAB — CK: Total CK: 249 U/L — ABNORMAL HIGH (ref 38–234)

## 2016-02-12 MED ORDER — INSULIN ASPART PROT & ASPART (70-30 MIX) 100 UNIT/ML ~~LOC~~ SUSP
45.0000 [IU] | Freq: Two times a day (BID) | SUBCUTANEOUS | Status: DC
  Administered 2016-02-12 – 2016-02-13 (×3): 45 [IU] via SUBCUTANEOUS

## 2016-02-12 MED ORDER — CLOPIDOGREL BISULFATE 75 MG PO TABS
75.0000 mg | ORAL_TABLET | Freq: Every day | ORAL | Status: DC
  Administered 2016-02-12 – 2016-02-14 (×3): 75 mg via ORAL
  Filled 2016-02-12 (×3): qty 1

## 2016-02-12 NOTE — Progress Notes (Signed)
Pt to xray

## 2016-02-12 NOTE — Progress Notes (Addendum)
Occupational Therapy Treatment Patient Details Name: Kaylee Mullins MRN: [id number] DOB: 03-01-1936 Today's Date: 02/12/2016    History of present illness 80 yo female s/p bilateral laminotomies and foraminotomies L3-L4. PMHx: diabetes, thyroid disease, increased appetite, excessive thirst and urination, ringing in the ears, balance disturbance, high blood pressure, swelling in the feet and hands.    OT comments  Focus of session on AE education; pt able to return demo use of all AE. Pt able to recall 2/3 back precautions but required VCs throughout session for no twisting during functional activities. D/c plan remains appropriate. Will continue to follow acutely.   Follow Up Recommendations  SNF    Equipment Recommendations  Other (comment) (RW, AE)    Recommendations for Other Services      Precautions / Restrictions Precautions Precautions: Back Precaution Comments: Pt able to verbally recall 2/3 precautions; reviewed all precautions with pt. Required Braces or Orthoses: Spinal Brace Spinal Brace: Lumbar corset Restrictions Weight Bearing Restrictions: No       Mobility Bed Mobility Overal bed mobility: Needs Assistance Bed Mobility: Rolling;Sidelying to Sit;Sit to Sidelying Rolling: Supervision Sidelying to sit: Supervision     Sit to sidelying: Supervision General bed mobility comments: VCs for log roll technique. HOB flat with use of bed rail.  Transfers                      Balance Overall balance assessment: Needs assistance Sitting-balance support: Feet supported;No upper extremity supported Sitting balance-Leahy Scale: Good                             ADL Overall ADL's : Needs assistance/impaired               Lower Body Bathing Details (indicate cue type and reason): Educated pt on use of long handled sponge; pt verbalized understanding.     Lower Body Dressing: Min guard;With adaptive equipment;Sit to/from stand Lower  Body Dressing Details (indicate cue type and reason): Educated pt on use of long handled shoe horn, reacher, and sock aide; pt able to return demo use of all AE.       Toileting - Clothing Manipulation Details (indicate cue type and reason): Educated pt on use of toilet aide for increased independence of peri care while maintaining back precautions.       General ADL Comments: Educated pt on maintaining back precautions during ADLs and functional activities. Pt required VCs during functional activities to maintain precautions; pt attempting to twist on multiple occasions.       Vision                     Perception     Praxis      Cognition   Behavior During Therapy: WFL for tasks assessed/performed Overall Cognitive Status: Within Functional Limits for tasks assessed                       Extremity/Trunk Assessment               Exercises     Shoulder Instructions       General Comments      Pertinent Vitals/ Pain       Pain Assessment: Faces Faces Pain Scale: Hurts even more Pain Location: back Pain Descriptors / Indicators: Aching;Sore Pain Intervention(s): Monitored during session  Home Living  Prior Functioning/Environment              Frequency Min 2X/week     Progress Toward Goals  OT Goals(current goals can now be found in the care plan section)  Progress towards OT goals: Progressing toward goals  Acute Rehab OT Goals Patient Stated Goal: go to [facility] OT Goal Formulation: With patient  Plan Discharge plan remains appropriate    Co-evaluation                 End of Session Equipment Utilized During Treatment: Other (comment) (AE)   Activity Tolerance Patient tolerated treatment well   Patient Left in bed;with call bell/phone within reach;with bed alarm set;with family/visitor present   Nurse Communication          Time: [id number] OT Time  Calculation (min): 21 min  Charges: OT General Charges $OT Visit: 1 Procedure OT Treatments $Self Care/Home Management : 8-22 mins  Binnie Kand M.S., OTR/L Pager: [phone]  02/12/2016, 4:09 PM

## 2016-02-12 NOTE — Clinical Social Work Placement (Signed)
   CLINICAL SOCIAL WORK PLACEMENT  NOTE  Date:  02/12/2016  Patient Details  Name: [name] MRN: [id number] Date of Birth: 1936-07-17  Clinical Social Work is seeking post-discharge placement for this patient at the [place] level of care (*CSW will initial, date and re-position this form in  chart as items are completed):  Yes   Patient/family provided with [place] Work Department's list of facilities offering this level of care within the geographic area requested by the patient (or if unable, by the patient's family).  Yes   Patient/family informed of their freedom to choose among providers that offer the needed level of care, that participate in Medicare, Medicaid or managed care program needed by the patient, have an available bed and are willing to accept the patient.  Yes   Patient/family informed of [facility]'s ownership interest in [facility] and [facility], as well as of the fact that they are under no obligation to receive care at these facilities.  PASRR submitted to EDS on 02/12/16     PASRR number received on 02/12/16     Existing PASRR number confirmed on       FL2 transmitted to all facilities in geographic area requested by pt/family on 02/12/16     FL2 transmitted to all facilities within larger geographic area on       Patient informed that his/her managed care company has contracts with or will negotiate with certain facilities, including the following:            Patient/family informed of bed offers received.  Patient chooses bed at       Physician recommends and patient chooses bed at      Patient to be transferred to   on  .  Patient to be transferred to facility by       Patient family notified on   of transfer.  Name of family member notified:        PHYSICIAN Please sign FL2     Additional Comment:    _______________________________________________ Ross Ludwig, LCSWA 02/12/2016,  5:37 PM

## 2016-02-12 NOTE — Progress Notes (Signed)
Ultrasound requesting pt to be NPO until at least 1800 for [place]. Pt made aware and understands.

## 2016-02-12 NOTE — NC FL2 (Signed)
[place] LEVEL OF CARE SCREENING TOOL     IDENTIFICATION  Patient Name: Kaylee Mullins Birthdate: 20-May-1936 Sex: female Admission Date (Current Location): 02/10/2016  [facility] and [place] Number:  Herbalist and Address:  [facility], [place] [address] [place], [place] [address]      Provider Number: [id number]  Attending Physician Name and Address:  Kristeen Miss, MD  Relative Name and Phone Number:  Soto,Sheila Daughter [phone] or [phone]    Current Level of Care: Hospital Recommended Level of Care: [place] Prior Approval Number:    Date Approved/Denied:   PASRR Number: [id number]  Discharge Plan: SNF    Current Diagnoses: Patient Active Problem List   Diagnosis Date Noted  . Essential hypertension 02/11/2016  . Lumbar stenosis with neurogenic claudication 02/10/2016  . Hyperlipidemia 02/10/2016  . Hypothyroidism 02/10/2016  . Musculoskeletal neck pain 12/13/2014  . Muscular pain 12/13/2014  . Numbness   . Acute CVA (cerebrovascular accident) ([place]) 09/16/2014  . Right renal mass 09/13/2014  . Cerebral infarction due to unspecified mechanism   . CVA (cerebral infarction) 09/11/2014  . Osteoarthritis 09/11/2014  . Postsurgical hypothyroidism 09/11/2014  . Uncontrolled hypertension 09/11/2014  . History of CVA (cerebral vascular accident) ([place]) 09/11/2014  . Diabetes mellitus ([place]) 04/20/2007  . Dyslipidemia 04/20/2007    Orientation RESPIRATION BLADDER Height & Weight     Self, Time, Situation, Place  Normal Continent Weight: 239 lb (108.41 kg) Height:  5\' 4"  (162.6 cm)  BEHAVIORAL SYMPTOMS/MOOD NEUROLOGICAL BOWEL NUTRITION STATUS      Continent Diet (Carb Modified)  AMBULATORY STATUS COMMUNICATION OF NEEDS Skin   Limited Assist Verbally Surgical wounds                       Personal Care Assistance Level of Assistance  Bathing, Dressing Bathing Assistance: Limited  assistance   Dressing Assistance: Limited assistance     Functional Limitations Info             SPECIAL CARE FACTORS FREQUENCY  PT (By licensed PT), OT (By licensed OT)     PT Frequency: 5x a week OT Frequency: 5x a week            Contractures      Additional Factors Info  Insulin Sliding Scale       Insulin Sliding Scale Info: 3x a day       Current Medications (02/12/2016):  This is the current hospital active medication list Current Facility-Administered Medications  Medication Dose Route Frequency Provider Last Rate Last Dose  . 0.9 %  sodium chloride infusion  250 mL Intravenous Continuous Kristeen Miss, MD   250 mL at 02/10/16 2100  . alum & mag hydroxide-simeth (MAALOX/MYLANTA) 200-200-20 MG/5ML suspension 30 mL  30 mL Oral Q6H PRN Kristeen Miss, MD      . bisacodyl (DULCOLAX) suppository 10 mg  10 mg Rectal Daily PRN Kristeen Miss, MD      . clopidogrel (PLAVIX) tablet 75 mg  75 mg Oral Daily Reyne Dumas, MD   75 mg at 02/12/16 1418  . diltiazem (CARDIZEM CD) 24 hr capsule 360 mg  360 mg Oral Daily Kristeen Miss, MD   360 mg at 02/12/16 1126  . docusate sodium (COLACE) capsule 100 mg  100 mg Oral BID Kristeen Miss, MD   100 mg at 02/12/16 1127  . furosemide (LASIX) tablet 40 mg  40 mg Oral Daily Kristeen Miss, MD  40 mg at 02/12/16 1126  . hydrALAZINE (APRESOLINE) tablet 25 mg  25 mg Oral Q6H PRN Reyne Dumas, MD   25 mg at 02/11/16 1615  . irbesartan (AVAPRO) tablet 300 mg  300 mg Oral Daily Skeet Simmer, RPH   300 mg at 02/12/16 1126   And  . hydrochlorothiazide (HYDRODIURIL) tablet 25 mg  25 mg Oral Daily Skeet Simmer, RPH   25 mg at 02/12/16 1126  . HYDROcodone-acetaminophen (NORCO/VICODIN) 5-325 MG per tablet 1-2 tablet  1-2 tablet Oral Q4H PRN Kristeen Miss, MD   2 tablet at 02/11/16 0831  . insulin aspart (novoLOG) injection 0-15 Units  0-15 Units Subcutaneous TID WC Kristeen Miss, MD   5 Units at 02/12/16 1234  . insulin aspart (novoLOG) injection 0-5  Units  0-5 Units Subcutaneous QHS Kristeen Miss, MD   2 Units at 02/10/16 2309  . insulin aspart protamine- aspart (NOVOLOG MIX 70/30) injection 45 Units  45 Units Subcutaneous BID WC Reyne Dumas, MD      . levothyroxine (SYNTHROID, LEVOTHROID) tablet 200 mcg  200 mcg Oral QAC breakfast Kristeen Miss, MD   200 mcg at 02/12/16 0612  . menthol-cetylpyridinium (CEPACOL) lozenge 3 mg  1 lozenge Oral PRN Kristeen Miss, MD       Or  . phenol (CHLORASEPTIC) mouth spray 1 spray  1 spray Mouth/Throat PRN Kristeen Miss, MD      . methocarbamol (ROBAXIN) tablet 500 mg  500 mg Oral Q6H PRN Kristeen Miss, MD   500 mg at 02/12/16 0404   Or  . methocarbamol (ROBAXIN) 500 mg in dextrose 5 % 50 mL IVPB  500 mg Intravenous Q6H PRN Kristeen Miss, MD      . metoprolol succinate (TOPROL-XL) 24 hr tablet 100 mg  100 mg Oral BID Kristeen Miss, MD   100 mg at 02/12/16 1126  . morphine 2 MG/ML injection 1-4 mg  1-4 mg Intravenous Q3H PRN Kristeen Miss, MD      . ondansetron [facility]) injection 4 mg  4 mg Intravenous Q4H PRN Kristeen Miss, MD      . oxyCODONE-acetaminophen (PERCOCET/ROXICET) 5-325 MG per tablet 1-2 tablet  1-2 tablet Oral Q4H PRN Kristeen Miss, MD   2 tablet at 02/12/16 1127  . polyethylene glycol (MIRALAX / GLYCOLAX) packet 17 g  17 g Oral Daily PRN Kristeen Miss, MD      . [facility]) tablet 8.6 mg  1 tablet Oral BID Kristeen Miss, MD   8.6 mg at 02/12/16 1127  . sodium chloride flush (NS) 0.9 % injection 3 mL  3 mL Intravenous Q12H Kristeen Miss, MD   3 mL at 02/12/16 1000  . sodium chloride flush (NS) 0.9 % injection 3 mL  3 mL Intravenous PRN Kristeen Miss, MD      . sodium phosphate (FLEET) 7-19 GM/118ML enema 1 enema  1 enema Rectal Once PRN Kristeen Miss, MD         Discharge Medications: Please see discharge summary for a list of discharge medications.  Relevant Imaging Results:  Relevant Lab Results:   Additional Information SSN [id number]  Ross Ludwig, [place]

## 2016-02-12 NOTE — Clinical Social Work Note (Signed)
Clinical Social Work Assessment  Patient Details  Name: Kaylee Mullins MRN: [id number] Date of Birth: 1936-01-17  Date of referral:  02/12/16               Reason for consult:  Facility Placement                Permission sought to share information with:    Permission granted to share information::  Yes, Verbal Permission Granted  Name::     Soto,Sheila Daughter [phone] or [phone]  Agency::  SNF admissions  Relationship::  daughter  Contact Information:     Housing/Transportation Living arrangements for the past 2 months:  Single Family Home Source of Information:  Patient Patient Interpreter Needed:  None Criminal Activity/Legal Involvement Pertinent to Current Situation/Hospitalization:  No - Comment as needed Significant Relationships:  Adult Children Lives with:  Self Do you feel safe going back to the place where you live?  No Need for family participation in patient care:  No (Coment)  Care giving concerns:  Patient feels like she needs some short term rehab before she is able to return back home.   Social Worker assessment / plan:  Patient is a 80 year old female who lives alone, patient is alert and oriented x4 and able to express her feelings.  Patient states she has been to rehab several years ago, but has not been recently.  CSW explained to patient how SNF bed search process and also informed her about how insurance will pay for patient's stay.  Patient was explained what to expect at SNF and day of discharge, patient says she thinks her daughter will be able to provide transportation for her to get to SNF.  Patient states she prefers [facility] but is willing to look at other options, if [facility] is not able to accept her.  Patient stated she did not have any other questions and she gave CSW permission to begin bed search for patient in [place].  Employment status:  Retired Nurse, adult PT Recommendations:  [place] / Referral to community resources:  [place]  Patient/Family's Response to care:  Patient in agreement to going to SNF for short term rehab.  Patient/Family's Understanding of and Emotional Response to Diagnosis, Current Treatment, and Prognosis:  Patient aware of current diagnosis and treatment plan.  Emotional Assessment Appearance:  Appears stated age Attitude/Demeanor/Rapport:    Affect (typically observed):  Calm, Pleasant, Appropriate Orientation:  Oriented to Self, Oriented to Place, Oriented to  Time, Oriented to Situation Alcohol / Substance use:  Not Applicable Psych involvement (Current and /or in the community):  No (Comment)  Discharge Needs  Concerns to be addressed:  No discharge needs identified Readmission within the last 30 days:  No Current discharge risk:  Lives alone Barriers to Discharge:  No Barriers Identified   Anell Barr 02/12/2016, 5:32 PM

## 2016-02-12 NOTE — Progress Notes (Signed)
Patient ID: Kaylee Mullins, female   DOB: 11/18/1935, 80 y.o.   MRN: [id number] Vital signs are stable Patient reports some weakness and left lower extremity This is not different than what she had experienced previously but is secondary to her old stroke Back incision appears to be doing well She will need stay in the skilled nursing facility for some additional rehabilitation She like to [facility] Do not see any documentation from social services regarding this at this time Appreciate help of hospitalist service

## 2016-02-12 NOTE — Care Management Obs Status (Signed)
[facility] NOTIFICATION   Patient Details  Name: Kaylee Mullins MRN: [id number] Date of Birth: 24-Jul-1936   Medicare Observation Status Notification Given:  Yes    Ninfa Meeker, RN 02/12/2016, 11:01 AM

## 2016-02-12 NOTE — Progress Notes (Addendum)
rn paging neurosugeon. Asking about DVT prophylaxis   Per Dr Ellene Route, pt can either be restarted on plavix or started on another type of anticoagulant.

## 2016-02-12 NOTE — Progress Notes (Addendum)
[facility] PROGRESS NOTE  Kaylee Mullins [id number] DOB: Apr 08, 1936 DOA: 02/10/2016   PCP: Philis Fendt, MD     Assessment/Plan: Principal Problem:   Lumbar stenosis with neurogenic claudication Active Problems:   Diabetes mellitus ([place])   History of CVA (cerebral vascular accident) ([place])   Hyperlipidemia   Hypothyroidism   Essential hypertension    80 year old female with a past medical history hypertension, hypothyroidism, type 2 diabetes, spinal stenosis who underwent bilateral laminotomies and foraminotomies of L3-L4 by Dr. Kristeen Miss and we are being consulted for medical management. Patient is currently in no acute distress and states that her pain is well managed.    Assessment and plan Lumbar stenosis with neurogenic claudication Continue pain management and postop care as per Dr. Ellene Route.   Transaminitis Patient is asymptomatic, no right upper quadrant pain We'll discontinue Crestor, check acute hepatitis panel Follow liver function, DC Tylenol, check right upper quadrant ultrasound, check CK  Fever Obtain chest x-ray, UA Likely secondary to atelectasis postoperatively, encourage incentive spirometry Neurosurgery to consider pharmacologic DVT prophylaxis postoperatively   Diabetes mellitus (HCC)-hemoglobin A1c 9.7 Carbohydrate modified diet. CBG monitoring with regular insulin sliding scale. Patient takes Humalog 50/50, 90 units in the morning and 60 units in the evening, this type of insulin is nonformulary Increase 70/30, 45 units twice a day   History of CVA (cerebral vascular accident) ([place]) Resume Plavix     Hyperlipidemia Continue Crestor 20 mg by mouth daily. Monitor LFTs.   Hypothyroidism Continue levothyroxine 200 g by mouth daily. Check TSH   Essential hypertension. Somewhat uncontrolled postsurgery Continue metoprolol 100 mg by mouth twice a day. Continue Avapro 300 mg by mouth daily. Continue HCTZ 25 mg by  mouth daily. Prn hydralazine    Code Status:  Full code  Family Communication: Discussed in detail with the patient, all imaging results, lab results explained to the patient   Disposition Plan:  Per neurosurgery , SNF?     Consultants:  [facility]  Procedures:  *None  Antibiotics: Anti-infectives    Start     Dose/Rate Route Frequency Ordered Stop   02/11/16 0000  ceFAZolin (ANCEF) IVPB 1 g/50 mL premix     1 g 100 mL/hr over 30 Minutes Intravenous Every 8 hours 02/10/16 2046 02/11/16 0854       HPI/Subjective: Denies any nausea vomiting right upper quadrant pain, febrile  Objective: Filed Vitals:   02/11/16 2250 02/12/16 0119 02/12/16 0616 02/12/16 0954  BP: 167/58 140/39 134/62 138/91  Pulse: 79 69 66 60  Temp: 100.7 F (38.2 C) 100.7 F (38.2 C) 99.1 F (37.3 C) 99.3 F (37.4 C)  TempSrc: Oral Oral Oral Oral  Resp: 18 18 18 18   Height:      Weight:      SpO2: 93% 100% 95% 99%    Intake/Output Summary (Last 24 hours) at 02/12/16 1145 Last data filed at 02/11/16 1400  Gross per 24 hour  Intake    240 ml  Output      0 ml  Net    240 ml    Exam:  Examination:  General exam: Appears calm and comfortable  Respiratory system: Clear to auscultation. Respiratory effort normal. Cardiovascular system: S1 & S2 heard, RRR. No JVD, murmurs, rubs, gallops or clicks. No pedal edema. Gastrointestinal system: Abdomen is nondistended, soft and nontender. No organomegaly or masses felt. Normal bowel sounds heard. Central nervous system: Alert and oriented. No focal neurological deficits. Extremities:  Symmetric 5 x 5 power. Skin: No rashes, lesions or ulcers Psychiatry: Judgement and insight appear normal. Mood & affect appropriate.     Data Reviewed: I have personally reviewed following labs and imaging studies  Micro Results No results found for this or any previous visit (from the past 240 hour(s)).  Radiology Reports Dg Lumbar Spine 2-3  Views  02/10/2016  CLINICAL DATA:  L3-4 laminectomy. EXAM: LUMBAR SPINE - 2-3 VIEW COMPARISON:  12/18/2015 FINDINGS: Examination demonstrates stable subtle grade 1 anterolisthesis of L3 on L4 with moderate facet arthropathy over the mid to lower lumbar spine. Mild spondylosis is present. Intervertebral cages present at the L4-5 level. Disc space narrowing at the L3-4 level. A metallic needle is present with tip projected over the superior aspect of the L4 spinous process. Subsequent film demonstrates surgical instrument with tip over the posterior elements at the L3-4 level. IMPRESSION: Mild spondylosis of the lumbar spine with stable grade 1 anterolisthesis of L3 on L4. Disc space narrowing at the L3-4 level and intervertebral cage at the L4-5 level. Surgical instrument over the posterior elements at the L3-4 level. Electronically Signed   By: Marin Olp M.D.   On: 02/10/2016 18:45     CBC  Recent Labs Lab 02/10/16 1356  WBC 7.5  HGB 12.9  HCT 39.5  PLT 276  MCV 93.8  MCH 30.6  MCHC 32.7  RDW 14.0    Chemistries   Recent Labs Lab 02/10/16 1356 02/12/16 0919  NA 139 136  K 4.2 3.6  CL 105 102  CO2 24 27  GLUCOSE 212* 252*  BUN 15 18  CREATININE 1.13* 1.06*  CALCIUM 9.4 8.5*  AST  --  183*  ALT  --  88*  ALKPHOS  --  82  BILITOT  --  1.2   ------------------------------------------------------------------------------------------------------------------ estimated creatinine clearance is 53.5 mL/min (by C-G formula based on Cr of 1.06). ------------------------------------------------------------------------------------------------------------------ No results for input(s): HGBA1C in the last 72 hours. ------------------------------------------------------------------------------------------------------------------ No results for input(s): CHOL, HDL, LDLCALC, TRIG, CHOLHDL, LDLDIRECT in the last 72  hours. ------------------------------------------------------------------------------------------------------------------ No results for input(s): TSH, T4TOTAL, T3FREE, THYROIDAB in the last 72 hours.  Invalid input(s): FREET3 ------------------------------------------------------------------------------------------------------------------ No results for input(s): VITAMINB12, FOLATE, FERRITIN, TIBC, IRON, RETICCTPCT in the last 72 hours.  Coagulation profile No results for input(s): INR, PROTIME in the last 168 hours.  No results for input(s): DDIMER in the last 72 hours.  Cardiac Enzymes No results for input(s): CKMB, TROPONINI, MYOGLOBIN in the last 168 hours.  Invalid input(s): CK ------------------------------------------------------------------------------------------------------------------ Invalid input(s): POCBNP   CBG:  Recent Labs Lab 02/11/16 0818 02/11/16 1213 02/11/16 1731 02/11/16 2124 02/12/16 0703  GLUCAP 202* 223* 245* 126* 179*       Studies: Dg Lumbar Spine 2-3 Views  02/10/2016  CLINICAL DATA:  L3-4 laminectomy. EXAM: LUMBAR SPINE - 2-3 VIEW COMPARISON:  12/18/2015 FINDINGS: Examination demonstrates stable subtle grade 1 anterolisthesis of L3 on L4 with moderate facet arthropathy over the mid to lower lumbar spine. Mild spondylosis is present. Intervertebral cages present at the L4-5 level. Disc space narrowing at the L3-4 level. A metallic needle is present with tip projected over the superior aspect of the L4 spinous process. Subsequent film demonstrates surgical instrument with tip over the posterior elements at the L3-4 level. IMPRESSION: Mild spondylosis of the lumbar spine with stable grade 1 anterolisthesis of L3 on L4. Disc space narrowing at the L3-4 level and intervertebral cage at the L4-5 level. Surgical instrument over the posterior elements at  the L3-4 level. Electronically Signed   By: Marin Olp M.D.   On: 02/10/2016 18:45      Lab  Results  Component Value Date   HGBA1C 9.7* 01/22/2016   HGBA1C 8.9* 09/13/2014   HGBA1C 8.6* 09/12/2014   Lab Results  Component Value Date   LDLCALC 73 09/13/2014   CREATININE 1.06* 02/12/2016       Scheduled Meds: . diltiazem  360 mg Oral Daily  . docusate sodium  100 mg Oral BID  . furosemide  40 mg Oral Daily  . irbesartan  300 mg Oral Daily   And  . hydrochlorothiazide  25 mg Oral Daily  . insulin aspart  0-15 Units Subcutaneous TID WC  . insulin aspart  0-5 Units Subcutaneous QHS  . insulin aspart protamine- aspart  30 Units Subcutaneous BID WC  . levothyroxine  200 mcg Oral QAC breakfast  . metoprolol succinate  100 mg Oral BID  . rosuvastatin  20 mg Oral q1800  . senna  1 tablet Oral BID  . sodium chloride flush  3 mL Intravenous Q12H   Continuous Infusions: . sodium chloride       LOS: 2 days    Time spent: >30 MINS    [facility]  [facility] Pager [phone]. If 7PM-7AM, please contact night-coverage at [email or web address], password [facility] 02/12/2016, 11:45 AM  LOS: 2 days

## 2016-02-12 NOTE — Progress Notes (Signed)
Physical Therapy Treatment Patient Details Name: Kaylee Mullins MRN: [id number] DOB: 1935-11-12 Today's Date: 02/12/2016    History of Present Illness This 80 yo female s/p Bilateral laminotomies and foraminotomies L3-L4    PT Comments    Pt progressing well towards all goals. V/c's to adhere to precautions and for safety. Acute PT to con't to follow.  Follow Up Recommendations  SNF;Supervision/Assistance - 24 hour     Equipment Recommendations  None recommended by PT    Recommendations for Other Services       Precautions / Restrictions Precautions Precautions: Back Precaution Booklet Issued: Yes (comment) Precaution Comments: pt able to recall 2/3, pt re-educated Required Braces or Orthoses: Spinal Brace Spinal Brace: Lumbar corset Restrictions Weight Bearing Restrictions: No    Mobility  Bed Mobility               General bed mobility comments: pt sitting EOB eating breakfast  Transfers Overall transfer level: Needs assistance Equipment used: Rolling walker (2 wheeled) Transfers: Sit to/from Stand Sit to Stand: Min guard         General transfer comment: v/c's for safety and hand placement  Ambulation/Gait Ambulation/Gait assistance: Min guard Ambulation Distance (Feet): 150 Feet Assistive device: Rolling walker (2 wheeled) Gait Pattern/deviations: Step-through pattern Gait velocity: de Gait velocity interpretation: Below normal speed for age/gender General Gait Details: v/c's to step into walker to achieve more upright posture. pt with increased trunk flexion due to large chest and low back pain   Stairs            Wheelchair Mobility    Modified Rankin (Stroke Patients Only)       Balance Overall balance assessment: Needs assistance         Standing balance support: Bilateral upper extremity supported Standing balance-Leahy Scale: Fair Standing balance comment: pt able to pull up underwear and perform hygiene without UE  support                    Cognition Arousal/Alertness: Awake/alert Behavior During Therapy: WFL for tasks assessed/performed Overall Cognitive Status: Within Functional Limits for tasks assessed                      Exercises      General Comments General comments (skin integrity, edema, etc.): pt assisted to bathroom, min guard for transfer and supervision for hygiene      Pertinent Vitals/Pain Pain Assessment: 0-10 Pain Score: 8  Pain Location: incision site/back Pain Descriptors / Indicators: Operative site guarding Pain Intervention(s): Monitored during session    Home Living                      Prior Function            PT Goals (current goals can now be found in the care plan section) Progress towards PT goals: Progressing toward goals    Frequency  Min 5X/week    PT Plan Current plan remains appropriate    Co-evaluation             End of Session Equipment Utilized During Treatment: Back brace Activity Tolerance: Patient limited by fatigue Patient left: with call bell/phone within reach;in chair;with chair alarm set     Time: [id number] PT Time Calculation (min) (ACUTE ONLY): 23 min  Charges:  $Gait Training: 8-22 mins $Therapeutic Activity: 8-22 mins  G CodesKingsley Callander 02/12/2016, 8:31 AM  Kittie Plater, PT, DPT Pager #: [phone] Office #: [phone]

## 2016-02-13 DIAGNOSIS — E039 Hypothyroidism, unspecified: Secondary | ICD-10-CM

## 2016-02-13 DIAGNOSIS — R7989 Other specified abnormal findings of blood chemistry: Secondary | ICD-10-CM

## 2016-02-13 DIAGNOSIS — E785 Hyperlipidemia, unspecified: Secondary | ICD-10-CM | POA: Diagnosis not present

## 2016-02-13 DIAGNOSIS — I10 Essential (primary) hypertension: Secondary | ICD-10-CM | POA: Diagnosis not present

## 2016-02-13 DIAGNOSIS — M4806 Spinal stenosis, lumbar region: Secondary | ICD-10-CM | POA: Diagnosis not present

## 2016-02-13 LAB — GLUCOSE, CAPILLARY
Glucose-Capillary: 152 mg/dL — ABNORMAL HIGH (ref 65–99)
Glucose-Capillary: 171 mg/dL — ABNORMAL HIGH (ref 65–99)
Glucose-Capillary: 169 mg/dL — ABNORMAL HIGH (ref 65–99)
Glucose-Capillary: 68 mg/dL (ref 65–99)

## 2016-02-13 LAB — HEPATITIS PANEL, ACUTE
HCV Ab: <0.1 s/co ratio (ref 0.0–0.9)
Hep A IgM: NEGATIVE
Hep B C IgM: NEGATIVE
Hepatitis B Surface Ag: NEGATIVE

## 2016-02-13 LAB — COMPREHENSIVE METABOLIC PANEL
ALT: 93 U/L — ABNORMAL HIGH (ref 14–54)
AST: 137 U/L — ABNORMAL HIGH (ref 15–41)
Albumin: 3.2 g/dL — ABNORMAL LOW (ref 3.5–5.0)
Alkaline Phosphatase: 92 U/L (ref 38–126)
Anion gap: 11 (ref 5–15)
BUN: 13 mg/dL (ref 6–20)
CO2: 27 mmol/L (ref 22–32)
Calcium: 8.6 mg/dL — ABNORMAL LOW (ref 8.9–10.3)
Chloride: 100 mmol/L — ABNORMAL LOW (ref 101–111)
Creatinine, Ser: 0.93 mg/dL (ref 0.44–1.00)
GFR calc Af Amer: >60 mL/min (ref >60)
GFR calc non Af Amer: 58 mL/min — ABNORMAL LOW (ref >60)
Glucose, Bld: 141 mg/dL — ABNORMAL HIGH (ref 65–99)
Potassium: 3.5 mmol/L (ref 3.5–5.1)
Sodium: 138 mmol/L (ref 135–145)
Total Bilirubin: 1.3 mg/dL — ABNORMAL HIGH (ref 0.3–1.2)
Total Protein: 6.8 g/dL (ref 6.5–8.1)

## 2016-02-13 LAB — CBC
HCT: 35.7 % — ABNORMAL LOW (ref 36.0–46.0)
Hemoglobin: 11.7 g/dL — ABNORMAL LOW (ref 12.0–15.0)
MCH: 31.5 pg (ref 26.0–34.0)
MCHC: 32.8 g/dL (ref 30.0–36.0)
MCV: 96.2 fL (ref 78.0–100.0)
Platelets: 213 10*3/uL (ref 150–400)
RBC: 3.71 MIL/uL — ABNORMAL LOW (ref 3.87–5.11)
RDW: 14.3 % (ref 11.5–15.5)
WBC: 13.3 10*3/uL — ABNORMAL HIGH (ref 4.0–10.5)

## 2016-02-13 NOTE — Progress Notes (Addendum)
Pt assited to bathroom.  Pt refusing to wear brace. Pt starting to be impulsive and trying to get out of bed without assistance.

## 2016-02-13 NOTE — Progress Notes (Signed)
Another nurse and intern assisted pt to the bathroom.

## 2016-02-13 NOTE — Progress Notes (Signed)
Patient ID: Kaylee Mullins, female   DOB: [birth year], 80 y.o.   MRN: [id number] Feels poorly today Confused last night Abdomen with moderate distention Has not had BM since hospital laxitives today Right kidney mass is known and has been evaluated by Dr. Diona Fanti.

## 2016-02-13 NOTE — Progress Notes (Signed)
Provider notification made regarding [place] results from earlier this evening.  Patient stable in NAD.  Pain management ongoing.  Will continue to monitor closely.

## 2016-02-13 NOTE — Progress Notes (Signed)
Physical Therapy Treatment Patient Details Name: Kaylee Mullins MRN: [id number] DOB: 1936/04/11 Today's Date: 02/13/2016    History of Present Illness 80 yo female s/p bilateral laminotomies and foraminotomies L3-L4. PMHx: diabetes, thyroid disease, increased appetite, excessive thirst and urination, ringing in the ears, balance disturbance, high blood pressure, swelling in the feet and hands.     PT Comments    Patient reports having a fever last night and did not sleep well. Reports feeling tired and weaker today then yesterday. Min A for balance during gait training due to weakness in LLE. Pt able to recall 2/3 back precautions but required cues to adhere to them during mobility. Will follow acutely.   Follow Up Recommendations  SNF;Supervision/Assistance - 24 hour     Equipment Recommendations  None recommended by PT    Recommendations for Other Services       Precautions / Restrictions Precautions Precautions: Back Precaution Booklet Issued: No Precaution Comments: Pt able to verbally recall 2/3 precautions; reviewed all precautions with pt. Required Braces or Orthoses: Spinal Brace Spinal Brace: Lumbar corset;Applied in sitting position Restrictions Weight Bearing Restrictions: No    Mobility  Bed Mobility Overal bed mobility: Needs Assistance Bed Mobility: Rolling;Sidelying to Sit;Sit to Supine Rolling: Supervision Sidelying to sit: Supervision   Sit to supine: Supervision   General bed mobility comments: VCs for log roll technique as pt trying to just sit up to get to EOB.   Transfers Overall transfer level: Needs assistance Equipment used: Rolling walker (2 wheeled) Transfers: Sit to/from Stand Sit to Stand: Min guard         General transfer comment: v/c's for safety and hand placement.   Ambulation/Gait Ambulation/Gait assistance: Min assist Ambulation Distance (Feet): 120 Feet Assistive device: Rolling walker (2 wheeled) Gait  Pattern/deviations: Step-through pattern;Decreased stride length;Antalgic;Wide base of support Gait velocity: decreased   General Gait Details: Verbal cues to step into walker for upright posture; increased trunk flexion and weakness noted in LLE. Waddling like gait at times. 2 standing rest breaks.    Stairs            Wheelchair Mobility    Modified Rankin (Stroke Patients Only)       Balance Overall balance assessment: Needs assistance Sitting-balance support: Feet supported;No upper extremity supported Sitting balance-Leahy Scale: Good     Standing balance support: During functional activity Standing balance-Leahy Scale: Fair Standing balance comment: Able to stand and pull up underwear without UE support.                    Cognition Arousal/Alertness: Awake/alert Behavior During Therapy: WFL for tasks assessed/performed Overall Cognitive Status: Within Functional Limits for tasks assessed                      Exercises      General Comments        Pertinent Vitals/Pain Pain Assessment: Faces Faces Pain Scale: Hurts little more Pain Location: back Pain Descriptors / Indicators: Operative site guarding;Sore Pain Intervention(s): Monitored during session;Repositioned    Home Living                      Prior Function            PT Goals (current goals can now be found in the care plan section) Progress towards PT goals: Progressing toward goals (slowly)    Frequency  Min 5X/week    PT Plan Current plan remains appropriate  Co-evaluation             End of Session Equipment Utilized During Treatment: Back brace;Gait belt Activity Tolerance: Patient limited by fatigue Patient left: in bed;with call bell/phone within reach;with bed alarm set     Time: [id number] PT Time Calculation (min) (ACUTE ONLY): 20 min  Charges:  $Gait Training: 8-22 mins                    G Codes:      Dian Minahan A  Brittney Caraway 02/13/2016, 2:34 PM Wray Kearns, [place], DPT [phone]

## 2016-02-13 NOTE — Progress Notes (Addendum)
Pt given meds and then requesting to use bathroom. At 0936 rn assisted pt to the bathroom. After 10 min pt still did not want to get off the toilet. rn called tech to come because rn needed to go to huddle. Tech after 15 asked pt if she was ready to get up off toilet. Pt refused. Tech had to leave to assist another pt. Pt has call light and told to call when she is ready to get off the toilet. At present pt still sitting on the toilet.   After close to an hour rn told pt she had to go back to bed. Pt still tried to refused. rn explained that the doctor wanted to assess her and she had to go back to bed.

## 2016-02-13 NOTE — Progress Notes (Signed)
Family at bedside, given update. rn offered pt pain medication. Pt refused.

## 2016-02-13 NOTE — Clinical Social Work Note (Signed)
Patient has bed at SNF, [place] once medically stable for d/c.  CSW remains available as needed.   Glendon Axe, MSW, LCSWA [phone] 02/13/2016 10:18 AM

## 2016-02-13 NOTE — Progress Notes (Signed)
Pt assisted to bathroom

## 2016-02-13 NOTE — Progress Notes (Signed)
Pt requesting to go to the bathroom again. rn checked on pt again at 1215, pt did not want to get off the toilet. Pt has call light within reach.

## 2016-02-13 NOTE — Progress Notes (Addendum)
PROGRESS NOTE  Kaylee Mullins  [id number] DOB: 10/29/1935  DOA: 02/10/2016 PCP: Philis Fendt, MD   Brief Narrative:  80 year old female patient with PMH of DM 2, HTN, CVA on Plavix, hypothyroid, L4-5 decompression surgery 1997, chronic low back pain, admitted by neurosurgery and underwent bilateral laminotomies and foraminotomies L3-L4 for spinal stenosis on 6/26. [facility] were consulted on 6/26 for assistance in evaluation and management of her medical conditions i.e. DM and HTN.   Assessment & Plan:   Principal Problem:   Lumbar stenosis with neurogenic claudication Active Problems:   Diabetes mellitus ([place])   History of CVA (cerebral vascular accident) ([place])   Hyperlipidemia   Hypothyroidism   Essential hypertension   Lumbar spinal stenosis - Status post decompression surgery on 6/27. Management per primary service.  Type II DM - Hemoglobin A1c: 9.7 suggesting poor outpatient control. - Currently on NovoLog mix 70/30 insulin at 45 units 2 times daily and moderate NovoLog SSI with improved control. CBGs ranging between 137-181. - Continue current management. Will need close outpatient monitoring and management by PCP.  Essential hypertension - Now reasonably controlled on diltiazem, irbesartan, HCTZ and metoprolol. Continue.  Hyperlipidemia - Crestor held due to abnormal LFTs.  History of CVA - Plavix has been resumed.  Hypothyroid - TSH 0.966. Continue Synthroid.  Abnormal LFTs - Unclear etiology. No GI symptoms reported. Crestor has been held. - CK not significantly elevated (249). Acute hepatitis panel negative. - Stable or slightly better. RUQ ultrasound does not appear to have any acute findings. Follow LFTs in a.m.  Fever - Likely postop fever related to instrumentation. Temperatures no higher than 100.60F. Urine microscopy not indicative of UTI. Chest x-ray: Nonspecific hazy bilateral lower lobe opacities, favor atelectasis in the absence of symptoms  suggestive of pneumonia. Incentive spirometry. Monitor off of antibiotics for now.  Mild acute postoperative blood loss anemia - Follow CBCs and transfuse if hemoglobin <7 g per DL.  Right upper pole renal lesion Seen on MRI abdomen 07/19/15. As per primary service, being evaluated by urology as outpatient.    DVT prophylaxis: SCD's Code Status: Full Family Communication: Discussed with patient. No family at bedside.  Disposition Plan: Per primary service.   Consultants:   [facility] our consultants.   Procedures:   Lumbar spinal decompression surgery 6/27   Antimicrobials:   None    Subjective: Complaints of weakness but no other complaints. Denies pain, dyspnea, cough, fever, nausea, vomiting or diarrhea. Constipation +.   Objective:  Filed Vitals:   02/13/16 0935 02/13/16 0949 02/13/16 1525 02/13/16 1711  BP: 132/49  135/44 166/64  Pulse: 69  62 66  Temp:  97.7 F (36.5 C) 99.2 F (37.3 C) 99.4 F (37.4 C)  TempSrc:  Axillary Oral Oral  Resp:  20 20 20   Height:      Weight:      SpO2:  98% 94% 97%    Intake/Output Summary (Last 24 hours) at 02/13/16 1720 Last data filed at 02/13/16 0430  Gross per 24 hour  Intake    360 ml  Output    600 ml  Net   -240 ml   Filed Weights   02/10/16 1355  Weight: 108.41 kg (239 lb)    Examination:  General exam: Moderately built and overweight pleasant elderly female sitting up comfortably at edge of bed.  Respiratory system: slightly diminished breath sounds in the bases but otherwise clear to auscultation. Respiratory effort normal. Cardiovascular system: S1 & S2 heard, RRR. No JVD,  murmurs, rubs, gallops or clicks. Trace bilateral leg edema. Gastrointestinal system: Abdomen is nondistended, soft and nontender. No organomegaly or masses felt. Normal bowel sounds heard. Central nervous system: Alert and oriented2 . No focal neurological deficits. Extremities: Symmetric 5 x 5 power. Skin: No rashes, lesions or  ulcers Psychiatry: Judgement and insight appear normal. Mood & affect appropriate.     Data Reviewed: I have personally reviewed following labs and imaging studies  CBC:  Recent Labs Lab 02/10/16 1356 02/13/16 0228  WBC 7.5 13.3*  HGB 12.9 11.7*  HCT 39.5 35.7*  MCV 93.8 96.2  PLT [id number]   Basic Metabolic Panel:  Recent Labs Lab 02/10/16 1356 02/12/16 0919 02/13/16 0228  NA 139 136 138  K 4.2 3.6 3.5  CL 105 102 100*  CO2 24 27 27   GLUCOSE 212* 252* 141*  BUN 15 18 13   CREATININE 1.13* 1.06* 0.93  CALCIUM 9.4 8.5* 8.6*   GFR: Estimated Creatinine Clearance: 60.9 mL/min (by C-G formula based on Cr of 0.93). Liver Function Tests:  Recent Labs Lab 02/12/16 0919 02/13/16 0228  AST 183* 137*  ALT 88* 93*  ALKPHOS 82 92  BILITOT 1.2 1.3*  PROT 6.3* 6.8  ALBUMIN 3.1* 3.2*   No results for input(s): LIPASE, AMYLASE in the last 168 hours. No results for input(s): AMMONIA in the last 168 hours. Coagulation Profile: No results for input(s): INR, PROTIME in the last 168 hours. Cardiac Enzymes:  Recent Labs Lab 02/12/16 1232  CKTOTAL 249*   BNP (last 3 results) No results for input(s): PROBNP in the last 8760 hours. HbA1C: No results for input(s): HGBA1C in the last 72 hours. CBG:  Recent Labs Lab 02/12/16 1626 02/12/16 2131 02/13/16 0633 02/13/16 1115 02/13/16 1643  GLUCAP 181* 137* 152* 171* 169*   Lipid Profile: No results for input(s): CHOL, HDL, LDLCALC, TRIG, CHOLHDL, LDLDIRECT in the last 72 hours. Thyroid Function Tests:  Recent Labs  02/12/16 1232  TSH 0.966   Anemia Panel: No results for input(s): VITAMINB12, FOLATE, FERRITIN, TIBC, IRON, RETICCTPCT in the last 72 hours.  Sepsis Labs: No results for input(s): PROCALCITON, LATICACIDVEN in the last 168 hours.  No results found for this or any previous visit (from the past 240 hour(s)).       Radiology Studies: Dg Chest 2 View  02/12/2016  CLINICAL DATA:  Fever for 2 days  EXAM: CHEST  2 VIEW COMPARISON:  09/12/2014 chest CT. FINDINGS: Stable cardiomediastinal silhouette with mild cardiomegaly. No pneumothorax. No pleural effusion. No pulmonary edema. Stable small eventrations of the bilateral hemidiaphragms. Nonspecific hazy opacities in both lower lobes. IMPRESSION: Stable bilateral small diaphragmatic eventrations. Nonspecific hazy bilateral lower lobe opacities, favor atelectasis, cannot exclude a component of pneumonia. Stable mild cardiomegaly without pulmonary edema. Electronically Signed   By: Ilona Sorrel M.D.   On: 02/12/2016 13:25   US Abdomen Limited Ruq  02/12/2016  CLINICAL DATA:  Abnormal liver function tests. EXAM: US ABDOMEN LIMITED - RIGHT UPPER QUADRANT COMPARISON:  CT scan from 12/17/2014. FINDINGS: Gallbladder: Gallbladder is distended without evidence of gallstones. Gallbladder wall is borderline thickened at 3 mm. The sonographer reports no sonographic Murphy sign. Common bile duct: Diameter: 7 mm Liver: Increased echogenicity with poor acoustic through transmission. Note:  3.1 cm lesion identified upper pole right kidney. IMPRESSION: 1. Borderline gallbladder wall thickening without evidence of gallstones or pericholecystic fluid. The sonographer reports no sonographic Murphy sign. There is clinical concern for cystic duct obstruction, nuclear scintigraphy may prove helpful to  further evaluate. 2. Common bile duct diameter is upper limits of normal for patient age. 3. 3.1 cm lesion upper pole right kidney. 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 Electronically Signed   By: Misty Stanley M.D.   On: 02/12/2016 20:21        Scheduled Meds: . clopidogrel  75 mg Oral Daily  . diltiazem  360 mg Oral Daily  . docusate sodium  100 mg Oral BID  . furosemide  40 mg Oral Daily  . irbesartan  300 mg Oral Daily   And  . hydrochlorothiazide  25 mg Oral Daily  . insulin  aspart  0-15 Units Subcutaneous TID WC  . insulin aspart  0-5 Units Subcutaneous QHS  . insulin aspart protamine- aspart  45 Units Subcutaneous BID WC  . levothyroxine  200 mcg Oral QAC breakfast  . metoprolol succinate  100 mg Oral BID  . senna  1 tablet Oral BID  . sodium chloride flush  3 mL Intravenous Q12H   Continuous Infusions: . sodium chloride       LOS: 3 days    Time spent: 40 minutes.    [facility], MD [facility] Pager [phone]  If 7PM-7AM, please contact night-coverage [email or web address] Password [email or web address] 02/13/2016, 5:20 PM

## 2016-02-14 DIAGNOSIS — I10 Essential (primary) hypertension: Secondary | ICD-10-CM | POA: Diagnosis not present

## 2016-02-14 DIAGNOSIS — M4806 Spinal stenosis, lumbar region: Secondary | ICD-10-CM | POA: Diagnosis not present

## 2016-02-14 DIAGNOSIS — E785 Hyperlipidemia, unspecified: Secondary | ICD-10-CM | POA: Diagnosis not present

## 2016-02-14 DIAGNOSIS — E039 Hypothyroidism, unspecified: Secondary | ICD-10-CM | POA: Diagnosis not present

## 2016-02-14 LAB — CBC
HCT: 35.1 % — ABNORMAL LOW (ref 36.0–46.0)
Hemoglobin: 11.5 g/dL — ABNORMAL LOW (ref 12.0–15.0)
MCH: 31.7 pg (ref 26.0–34.0)
MCHC: 32.8 g/dL (ref 30.0–36.0)
MCV: 96.7 fL (ref 78.0–100.0)
Platelets: 242 10*3/uL (ref 150–400)
RBC: 3.63 MIL/uL — ABNORMAL LOW (ref 3.87–5.11)
RDW: 14.2 % (ref 11.5–15.5)
WBC: 11 10*3/uL — ABNORMAL HIGH (ref 4.0–10.5)

## 2016-02-14 LAB — HEPATIC FUNCTION PANEL
ALT: 76 U/L — ABNORMAL HIGH (ref 14–54)
AST: 85 U/L — ABNORMAL HIGH (ref 15–41)
Albumin: 2.8 g/dL — ABNORMAL LOW (ref 3.5–5.0)
Alkaline Phosphatase: 101 U/L (ref 38–126)
Bilirubin, Direct: 0.3 mg/dL (ref 0.1–0.5)
Indirect Bilirubin: 0.7 mg/dL (ref 0.3–0.9)
Total Bilirubin: 1 mg/dL (ref 0.3–1.2)
Total Protein: 6.4 g/dL — ABNORMAL LOW (ref 6.5–8.1)

## 2016-02-14 LAB — GLUCOSE, CAPILLARY
Glucose-Capillary: 93 mg/dL (ref 65–99)
Glucose-Capillary: 134 mg/dL — ABNORMAL HIGH (ref 65–99)
Glucose-Capillary: 154 mg/dL — ABNORMAL HIGH (ref 65–99)
Glucose-Capillary: 154 mg/dL — ABNORMAL HIGH (ref 65–99)

## 2016-02-14 MED ORDER — INSULIN ASPART 100 UNIT/ML ~~LOC~~ SOLN
0.0000 [IU] | Freq: Three times a day (TID) | SUBCUTANEOUS | Status: DC
  Administered 2016-02-14: 2 [IU] via SUBCUTANEOUS
  Administered 2016-02-14: 1 [IU] via SUBCUTANEOUS
  Administered 2016-02-14: 2 [IU] via SUBCUTANEOUS

## 2016-02-14 MED ORDER — INSULIN ASPART PROT & ASPART (70-30 MIX) 100 UNIT/ML ~~LOC~~ SUSP
40.0000 [IU] | Freq: Two times a day (BID) | SUBCUTANEOUS | Status: DC
  Administered 2016-02-14 (×2): 40 [IU] via SUBCUTANEOUS

## 2016-02-14 MED ORDER — OXYCODONE-ACETAMINOPHEN 5-325 MG PO TABS: 1.0000 | ORAL_TABLET | ORAL | Status: DC | PRN

## 2016-02-14 NOTE — Clinical Social Work Note (Addendum)
Clinical Social Worker facilitated patient discharge including contacting patient family and facility to confirm patient discharge plans.  Clinical information faxed to facility and family agreeable with plan.  Patient's dtr to transport patient  to [facility].  RN to call report prior to discharge.  Clinical Social Worker will sign off for now as social work intervention is no longer needed. Please consult [place] again if new need arises.  Glendon Axe, MSW, LCSWA [phone] 02/14/2016 3:16 PM

## 2016-02-14 NOTE — Progress Notes (Signed)
PT Cancellation Note  Patient Details Name: Kaylee Mullins MRN: [id number] DOB: 04-14-1936   Cancelled Treatment:    Reason Eval/Treat Not Completed: Patient declined, no reason specified Pt declined due to feeling "too tired" from getting ready to d/c to [facility]. Educated pt that she may not receive PT today at SNF.    Salina April, PTA Pager: [phone]   02/14/2016, 3:47 PM

## 2016-02-14 NOTE — Progress Notes (Signed)
Discharge instructions reviewed with patient and family. Lines removed by previous Therapist, sports. Pt transferred to personal car in wheelchair by nurse tech. Family will transport patient to [facility].

## 2016-02-14 NOTE — Progress Notes (Signed)
Report given to nurse at [facility]

## 2016-02-14 NOTE — Clinical Social Work Note (Signed)
Patient has bed at [facility] [place]. Admissions paperwork completed at bedside.   Glendon Axe, MSW, [place] [phone] 02/14/2016 12:21 PM

## 2016-02-14 NOTE — Progress Notes (Signed)
PROGRESS NOTE  Kaylee Mullins  [id number] DOB: Apr 11, 1936  DOA: 02/10/2016 PCP: Philis Fendt, MD   Brief Narrative:  80 year old female patient with PMH of DM 2, HTN, CVA on Plavix, hypothyroid, L4-5 decompression surgery 1997, chronic low back pain, admitted by neurosurgery and underwent bilateral laminotomies and foraminotomies L3-L4 for spinal stenosis on 6/26. [facility] were consulted on 6/26 for assistance in evaluation and management of her medical conditions i.e. DM and HTN.   Assessment & Plan:   Principal Problem:   Lumbar stenosis with neurogenic claudication Active Problems:   Diabetes mellitus ([place])   History of CVA (cerebral vascular accident) ([place])   Hyperlipidemia   Hypothyroidism   Essential hypertension   Lumbar spinal stenosis - Status post decompression surgery on 6/27. Management per primary service.  Type II DM - Hemoglobin A1c: 9.7 suggesting poor outpatient control. - Currently in the hospital, on NovoLog mix 70/30 insulin at 45 units 2 times daily and moderate NovoLog SSI with improved control.  - Noted hypoglycemia with CBG 68 on 02/13/16 at 9 PM. Reduced 70/30 insulin to 40 units twice a day, discontinued bedtime sliding scale and changed daytime sliding scale to sensitive. - As per med rec, at home, patient is on Humalog mix 50/50, 90 units before breakfast and 60 units before evening meals. She follows with Dr. Delrae Rend, Endocrinology and last saw him 3 weeks ago. She states that she has had poorly controlled diabetes for the last 3 months after she was treated for what appears to be UTI. She indicates home blood sugars are not well-controlled/elevated and she has about once a week hypoglycemic range CBGs usually in the high 60s, early 70s, mostly in the mornings. Recommend discharging her on Humalog mix 50/50 at 40 units twice a day. These insulins can be further adjusted at SNF as needed. She needs to closely follow up with her endocrinologist as  outpatient.  Essential hypertension - Now reasonably controlled on diltiazem, irbesartan, HCTZ, Lasix and metoprolol. Continue. Mildly fluctuating at times. Further monitoring and management at SNF.  Hyperlipidemia - Crestor held due to abnormal LFTs. Repeat LFTs in a week's time at SNF and if normalized, may consider changing to a different statin versus resuming Crestor with close monitoring of LFTs.  History of CVA - Plavix has been resumed.  Hypothyroid - TSH 0.966. Continue Synthroid.  Abnormal LFTs - Unclear etiology. No GI symptoms reported. Crestor has been held. - CK not significantly elevated (249). Acute hepatitis panel negative. - Slowly improving. RUQ ultrasound does not appear to have any acute findings. Recommend repeating LFTs in 1 week at SNF.  Fever - Likely postop fever related to instrumentation. Temperatures no higher than 100.32F. Urine microscopy not indicative of UTI. Chest x-ray: Nonspecific hazy bilateral lower lobe opacities, favor atelectasis in the absence of symptoms suggestive of pneumonia. Incentive spirometry. Monitor off of antibiotics for now.  Mild acute postoperative blood loss anemia - Follow CBCs and transfuse if hemoglobin <7 g per DL. Stable.  Right upper pole renal lesion Seen on MRI abdomen 07/19/15. As per primary service, being evaluated by urology as outpatient.    DVT prophylaxis: SCD's Code Status: Full Family Communication: Discussed with patient. No family at bedside.  Disposition Plan: Per primary service.   Consultants:   [facility] our consultants.   Procedures:   Lumbar spinal decompression surgery 6/27   Antimicrobials:   None    Subjective: She had a large BM since yesterday's visit. Denies abdominal pain. Denies any  specific complaints and states that she is going to rehabilitation today. As per RN, no acute issues.   Objective:  Filed Vitals:   02/13/16 2145 02/14/16 0122 02/14/16 0508 02/14/16 0844  BP: 167/53  147/48 153/47 145/56  Pulse: 78 64 72 61  Temp: 100.4 F (38 C) 98.5 F (36.9 C) 100 F (37.8 C) 97.5 F (36.4 C)  TempSrc: Oral Oral Oral Oral  Resp: 19 18 18    Height:      Weight:      SpO2: 96% 99% 96% 93%   No intake or output data in the 24 hours ending 02/14/16 1239 Filed Weights   02/10/16 1355  Weight: 108.41 kg (239 lb)    Examination:  General exam: Moderately built and overweight pleasant elderly female lying comfortably supine in bed. Respiratory system: slightly diminished breath sounds in the bases but otherwise clear to auscultation. Respiratory effort normal. Cardiovascular system: S1 & S2 heard, RRR. No JVD, murmurs, rubs, gallops or clicks. Trace bilateral leg edema. Gastrointestinal system: Abdomen is nondistended, soft and nontender. No organomegaly or masses felt. Normal bowel sounds heard. Central nervous system: Alert and oriented2 . No focal neurological deficits. Extremities: Symmetric 5 x 5 power. Skin: No rashes, lesions or ulcers. Surgical site without acute complications.  Psychiatry: Judgement and insight appear normal. Mood & affect appropriate.     Data Reviewed: I have personally reviewed following labs and imaging studies  CBC:  Recent Labs Lab 02/10/16 1356 02/13/16 0228 02/14/16 0631  WBC 7.5 13.3* 11.0*  HGB 12.9 11.7* 11.5*  HCT 39.5 35.7* 35.1*  MCV 93.8 96.2 96.7  PLT 276 213 [id number]   Basic Metabolic Panel:  Recent Labs Lab 02/10/16 1356 02/12/16 0919 02/13/16 0228  NA 139 136 138  K 4.2 3.6 3.5  CL 105 102 100*  CO2 24 27 27   GLUCOSE 212* 252* 141*  BUN 15 18 13   CREATININE 1.13* 1.06* 0.93  CALCIUM 9.4 8.5* 8.6*   GFR: Estimated Creatinine Clearance: 60.9 mL/min (by C-G formula based on Cr of 0.93). Liver Function Tests:  Recent Labs Lab 02/12/16 0919 02/13/16 0228 02/14/16 0631  AST 183* 137* 85*  ALT 88* 93* 76*  ALKPHOS 82 92 101  BILITOT 1.2 1.3* 1.0  PROT 6.3* 6.8 6.4*  ALBUMIN 3.1* 3.2* 2.8*    No results for input(s): LIPASE, AMYLASE in the last 168 hours. No results for input(s): AMMONIA in the last 168 hours. Coagulation Profile: No results for input(s): INR, PROTIME in the last 168 hours. Cardiac Enzymes:  Recent Labs Lab 02/12/16 1232  CKTOTAL 249*   BNP (last 3 results) No results for input(s): PROBNP in the last 8760 hours. HbA1C: No results for input(s): HGBA1C in the last 72 hours. CBG:  Recent Labs Lab 02/13/16 1643 02/13/16 2111 02/13/16 2142 02/14/16 0615 02/14/16 1145  GLUCAP 169* 68 93 134* 154*   Lipid Profile: No results for input(s): CHOL, HDL, LDLCALC, TRIG, CHOLHDL, LDLDIRECT in the last 72 hours. Thyroid Function Tests:  Recent Labs  02/12/16 1232  TSH 0.966   Anemia Panel: No results for input(s): VITAMINB12, FOLATE, FERRITIN, TIBC, IRON, RETICCTPCT in the last 72 hours.  Sepsis Labs: No results for input(s): PROCALCITON, LATICACIDVEN in the last 168 hours.  No results found for this or any previous visit (from the past 240 hour(s)).       Radiology Studies: Dg Chest 2 View  02/12/2016  CLINICAL DATA:  Fever for 2 days EXAM: CHEST  2 VIEW COMPARISON:  09/12/2014 chest CT. FINDINGS: Stable cardiomediastinal silhouette with mild cardiomegaly. No pneumothorax. No pleural effusion. No pulmonary edema. Stable small eventrations of the bilateral hemidiaphragms. Nonspecific hazy opacities in both lower lobes. IMPRESSION: Stable bilateral small diaphragmatic eventrations. Nonspecific hazy bilateral lower lobe opacities, favor atelectasis, cannot exclude a component of pneumonia. Stable mild cardiomegaly without pulmonary edema. Electronically Signed   By: Ilona Sorrel M.D.   On: 02/12/2016 13:25   US Abdomen Limited Ruq  02/12/2016  CLINICAL DATA:  Abnormal liver function tests. EXAM: US ABDOMEN LIMITED - RIGHT UPPER QUADRANT COMPARISON:  CT scan from 12/17/2014. FINDINGS: Gallbladder: Gallbladder is distended without evidence of  gallstones. Gallbladder wall is borderline thickened at 3 mm. The sonographer reports no sonographic Murphy sign. Common bile duct: Diameter: 7 mm Liver: Increased echogenicity with poor acoustic through transmission. Note:  3.1 cm lesion identified upper pole right kidney. IMPRESSION: 1. Borderline gallbladder wall thickening without evidence of gallstones or pericholecystic fluid. The sonographer reports no sonographic Murphy sign. There is clinical concern for cystic duct obstruction, nuclear scintigraphy may prove helpful to further evaluate. 2. Common bile duct diameter is upper limits of normal for patient age. 3. 3.1 cm lesion upper pole right kidney. 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 Electronically Signed   By: Misty Stanley M.D.   On: 02/12/2016 20:21        Scheduled Meds: . clopidogrel  75 mg Oral Daily  . diltiazem  360 mg Oral Daily  . docusate sodium  100 mg Oral BID  . furosemide  40 mg Oral Daily  . irbesartan  300 mg Oral Daily   And  . hydrochlorothiazide  25 mg Oral Daily  . insulin aspart  0-9 Units Subcutaneous TID WC  . insulin aspart protamine- aspart  40 Units Subcutaneous BID WC  . levothyroxine  200 mcg Oral QAC breakfast  . metoprolol succinate  100 mg Oral BID  . senna  1 tablet Oral BID  . sodium chloride flush  3 mL Intravenous Q12H   Continuous Infusions: . sodium chloride       LOS: 4 days    Time spent: 40 minutes.    [facility], MD [facility] Pager [phone]  If 7PM-7AM, please contact night-coverage [email or web address] Password [facility] 02/14/2016, 12:39 PM

## 2016-02-14 NOTE — Clinical Social Work Placement (Signed)
   CLINICAL SOCIAL WORK PLACEMENT  NOTE  Date:  02/14/2016  Patient Details  Name: Kaylee Mullins MRN: [id number] Date of Birth: 1936-03-29  Clinical Social Work is seeking post-discharge placement for this patient at the [place] level of care (*CSW will initial, date and re-position this form in  chart as items are completed):  Yes   Patient/family provided with [place] Work Department's list of facilities offering this level of care within the geographic area requested by the patient (or if unable, by the patient's family).  Yes   Patient/family informed of their freedom to choose among providers that offer the needed level of care, that participate in Medicare, Medicaid or managed care program needed by the patient, have an available bed and are willing to accept the patient.  Yes   Patient/family informed of [facility]'s ownership interest in [facility] and [facility], as well as of the fact that they are under no obligation to receive care at these facilities.  PASRR submitted to EDS on 02/12/16     PASRR number received on 02/12/16     Existing PASRR number confirmed on       FL2 transmitted to all facilities in geographic area requested by pt/family on 02/12/16     FL2 transmitted to all facilities within larger geographic area on       Patient informed that his/her managed care company has contracts with or will negotiate with certain facilities, including the following:        Yes   Patient/family informed of bed offers received.  Patient chooses bed at  [facility] )     Physician recommends and patient chooses bed at      Patient to be transferred to  [facility] ) on 02/14/16.  Patient to be transferred to facility by  [facility] )     Patient family notified on 02/14/16 of transfer.  Name of family member notified:   (Pt's dtr, Freda Munro)     PHYSICIAN Please sign FL2, Please  prepare priority discharge summary, including medications     Additional Comment:    _______________________________________________ Rozell Searing, LCSW 02/14/2016, 3:15 PM

## 2016-02-14 NOTE — Discharge Summary (Addendum)
Physician Discharge Summary  Patient ID: Kaylee Mullins MRN: [id number] DOB/AGE: 04/02/1936 80 y.o.  Admit date: 02/10/2016 Discharge date: 02/14/2016  Admission Diagnoses:Lumbar spinal stenosis with lumbar radiculopathy, diabetes mellitusHistory of stroke with left-sided weakness  Discharge Diagnoses: Lumbar spinal stenosis with lumbar radiculopathy, diabetes mellitus with poor control, acute blood loss anemia, postoperative confusion.History of stroke with left-sided weakness Principal Problem:   Lumbar stenosis with neurogenic claudication Active Problems:   Diabetes mellitus ([place])   History of CVA (cerebral vascular accident) ([place])   Hyperlipidemia   Hypothyroidism   Essential hypertension   Discharged Condition: fair  Hospital Course: Patient is a 80 year old individual whose had significant problems with back and bilateral lower extremity pain with central canal stenosis noted at the L4-L5 level. She is admitted to undergo surgical decompression which she tolerated well however during the postoperative phase was evident that she had poor diabetic control. She was seen by the hospitalist. She was treated medically for this process however because she lives independently is felt that she will require a more prolonged period of recuperation before she can go back to independent living. She is being transferred to skilled nursing facility.  Consults: Hospitalist consult  Significant Diagnostic Studies: None  Treatments: surgery: Bilateral laminotomies and foraminotomies L4-L5  Discharge Exam: Blood pressure 142/49, pulse 50, temperature 97.9 F (36.6 C), temperature source Oral, resp. rate 20, height 5\' 4"  (1.626 m), weight 108.41 kg (239 lb), SpO2 95 %. Incision is clean and dry motor function is intact in both lower extremities.  Disposition: 01-Home or Self Care  Discharge Instructions    Call MD for:  redness, tenderness, or signs of infection (pain, swelling, redness,  odor or green/yellow discharge around incision site)    Complete by:  As directed      Call MD for:  severe uncontrolled pain    Complete by:  As directed      Call MD for:  temperature >100.4    Complete by:  As directed      Diet - low sodium heart healthy    Complete by:  As directed      Increase activity slowly    Complete by:  As directed             Medication List    TAKE these medications        clopidogrel 75 MG tablet  Commonly known as:  PLAVIX  Take 1 tablet (75 mg total) by mouth daily.     diltiazem 360 MG 24 hr capsule  Commonly known as:  TIAZAC  Take 360 mg by mouth daily. Reported on 09/19/2015     furosemide 40 MG tablet  Commonly known as:  LASIX  TAKE 1 TABLET BY MOUTH EVERY DAY AS NEEDED FOR LEG SWELLING     HUMALOG MIX 50/50 KWIKPEN (50-50) 100 UNIT/ML Kwikpen  Generic drug:  Insulin Lispro Prot & Lispro  INJECT 90 UNITS BEFORE BREAKFAST AND 60 UNITS BEFORE EVENING MEAL     levothyroxine 200 MCG tablet  Commonly known as:  SYNTHROID, LEVOTHROID  Take 200 mcg by mouth daily.     metoprolol succinate 100 MG 24 hr tablet  Commonly known as:  TOPROL-XL  Take 100 mg by mouth 2 (two) times daily. Take with or immediately following a meal.     oxyCODONE-acetaminophen 5-325 MG tablet  Commonly known as:  PERCOCET/ROXICET  Take 1-2 tablets by mouth every 8 (eight) hours as needed for severe pain.     oxyCODONE-acetaminophen  5-325 MG tablet  Commonly known as:  PERCOCET/ROXICET  Take 1-2 tablets by mouth every 4 (four) hours as needed for moderate pain.     rosuvastatin 20 MG tablet  Commonly known as:  CRESTOR  Take 20 mg by mouth daily.     telmisartan-hydrochlorothiazide 80-25 MG tablet  Commonly known as:  MICARDIS HCT  Take 1 tablet by mouth daily.      ASK your doctor about these medications        [employer] NON-ASPIRIN EXTRA STRENGTH 500 MG tablet  Generic drug:  acetaminophen  TAKE 1 TO 2 TABLETS BY MOUTH TWICE A DAY AS NEEDED PAIN            Follow-up Information    Follow up with [facility] .   Specialty:  [place] information:   [address] [place] [phone]      Signed: Earleen Newport 02/14/2016, 2:21 PM

## 2016-02-14 NOTE — Discharge Instructions (Signed)
Spinal Fusion, Care After °Refer to this sheet in the next few weeks. These instructions provide you with information on caring for yourself after your procedure. Your caregiver may also give you more specific instructions. Your treatment has been planned according to current medical practices, but problems sometimes occur. Call your caregiver if you have any problems or questions after your procedure. °HOME CARE INSTRUCTIONS  °· Take whatever pain medicine has been prescribed by your caregiver. Do not take over-the-counter pain medicine unless directed otherwise by your caregiver. °· Do not drive if you are taking narcotic pain medicines. °· Change your bandage (dressing) if necessary or as directed by your caregiver. °· Do not get your surgical cut (incision) wet. After a few days you may take quick showers (rather than baths), but keep your incision clean and dry. Covering the incision with plastic wrap while you shower should keep your incision dry. A few weeks after surgery, once your incision has healed and your caregiver says it is okay, you can take baths or go swimming. °· If you have been prescribed medicine to prevent your blood from clotting, follow the directions carefully. °· Check the area around your incision often. Look for redness and swelling. Also, look for anything leaking from your wound. You can use a mirror or have a family member inspect your incision if it is in a place where it is difficult for you to see. °· Ask your caregiver what activities you should avoid and for how long. °· Walk as much as possible. °· Do not lift anything heavier than 10 pounds (4.5 kilograms) until your caregiver says it is safe. °· Do not twist or bend for a few weeks. Try not to pull on things. Avoid sitting for long periods of time. Change positions at least every hour. °· Ask your caregiver what kinds of exercise you should do to make your back stronger and when you should begin doing these exercises. °SEEK  IMMEDIATE MEDICAL CARE IF:  °· Pain suddenly becomes much worse. °· The incision area is red, swollen, bleeding, or leaking fluid. °· Your legs or feet become increasingly painful, numb, weak, or swollen. °· You have trouble controlling urination or bowel movements. °· You have trouble breathing. °· You have chest pain. °· You have a fever. °MAKE SURE YOU: °· Understand these instructions. °· Will watch your condition. °· Will get help right away if you are not doing well or get worse. °  °This information is not intended to replace advice given to you by your health care provider. Make sure you discuss any questions you have with your health care provider. °  °Document Released: 02/20/2005 Document Revised: 08/24/2014 Document Reviewed: 01/16/2015 °Elsevier Interactive Patient Education ©2016 [employer] ° °

## 2016-02-17 ENCOUNTER — Non-Acute Institutional Stay (SKILLED_NURSING_FACILITY): Payer: Medicare Other | Admitting: Family

## 2016-02-17 DIAGNOSIS — G5791 Unspecified mononeuropathy of right lower limb: Secondary | ICD-10-CM

## 2016-02-17 DIAGNOSIS — Z9889 Other specified postprocedural states: Secondary | ICD-10-CM | POA: Diagnosis not present

## 2016-02-17 DIAGNOSIS — R269 Unspecified abnormalities of gait and mobility: Secondary | ICD-10-CM | POA: Diagnosis not present

## 2016-02-17 DIAGNOSIS — M792 Neuralgia and neuritis, unspecified: Secondary | ICD-10-CM

## 2016-02-17 MED ORDER — GABAPENTIN 100 MG PO CAPS: 100.0000 mg | ORAL_CAPSULE | Freq: Every day | ORAL | Status: DC

## 2016-02-17 MED ORDER — GABAPENTIN 100 MG PO CAPS: 100.0000 mg | ORAL_CAPSULE | Freq: Two times a day (BID) | ORAL | Status: DC

## 2016-02-17 NOTE — Progress Notes (Signed)
Patient ID: Kaylee Mullins, female   DOB: [birth year], 80 y.o.   MRN: [id number]  Location:   [place] Room Number: 1204 Place of Service:  SNF (31) Provider:  Surafel Hilleary FNP-C   Philis Fendt, MD  Patient Care Team: Nolene Ebbs, MD as PCP - General  Extended Emergency Contact Information Primary Emergency Contact: Soto,Sheila Address: [address]          [place], [place] [address] [place] Phone: [phone] Work Phone: [phone] Mobile Phone: [phone] Relation: Daughter Secondary Emergency Contact: Jerilee Field Address: [place], [place] of [place] Phone: [phone] Relation: Son  Code Status: Full Code  Goals of care: Advanced Directive information Advanced Directives 02/10/2016  Does patient have an advance directive? No  Does patient want to make changes to advanced directive? No - Patient declined  Would patient like information on creating an advanced directive? No - patient declined information     Chief Complaint  Patient presents with  . Acute Visit    Pain     HPI:  Pt is a 80 y.o. female seen today at [facility]  for an acute visit for evaluation of right thigh pain. She status post L4-L5 Lumbar decompression due to lumbar stenosis with neurogenic claudication. She states since surgery her leg pain has improved but continues to have right thigh pain. Current pain regimen does not relief pain. She continues to worked well with PT/OT. She denies any other acute issues.    Past Medical History  Diagnosis Date  . Hypertension   . Hypothyroidism   . Diabetes mellitus     type !  . Cancer ([place])     thyroid cancer  . Spinal stenosis   . Stroke ([place]) 09/2014    left sided weakness  . Chronic kidney disease     questionable mass on kidney. Being followed by Dr Diona Fanti  . Arthritis     knees  . Full dentures      Past Surgical History  Procedure Laterality Date  . Thyroidectomy  2005  . Abdominal hysterectomy      partial  . Tonsillectomy    . Lumbar spine surgery    . Flexible sigmoidoscopy  03/29/2012    Procedure: FLEXIBLE SIGMOIDOSCOPY;  Surgeon: Jeryl Columbia, MD;  Location: [facility] ENDOSCOPY;  Service: Endoscopy;  Laterality: N/A;  fleet enema upon arrival  . Hot hemostasis  03/29/2012    Procedure: HOT HEMOSTASIS (ARGON PLASMA COAGULATION/BICAP);  Surgeon: Jeryl Columbia, MD;  Location: [facility] ENDOSCOPY;  Service: Endoscopy;  Laterality: N/A;  . Cataracts      Removed  11/2015  bilateral  . Lumbar laminectomy/decompression microdiscectomy N/A 02/10/2016    Procedure: Lumbar three- four Laminectomy;  Surgeon: Kristeen Miss, MD;  Location: [facility] NEURO ORS;  Service: Neurosurgery;  Laterality: N/A;  L3-4 Laminectomy    Allergies  Allergen Reactions  . Darvon [Propoxyphene Hcl] Other (See Comments)    hallucinations  . Nyquil Multi-Symptom [Pseudoeph-Doxylamine-Dm-Apap] Other (See Comments)    Makes pt not "feel right in her head"      Medication List       This list is accurate as of: 02/17/16  8:00 PM.  Always use your most recent med list.               atorvastatin 80 MG tablet  Commonly known as:  LIPITOR  Take 80 mg by mouth daily.     clopidogrel 75 MG tablet  Commonly known as:  PLAVIX  Take 1 tablet (75 mg total) by mouth daily.     diltiazem 360 MG 24 hr capsule  Commonly known as:  TIAZAC  Take 360 mg by mouth daily. Reported on 09/19/2015     furosemide 40 MG tablet  Commonly known as:  LASIX  TAKE 1 TABLET BY MOUTH EVERY DAY AS NEEDED FOR LEG SWELLING     HUMALOG MIX 50/50 KWIKPEN (50-50) 100 UNIT/ML Kwikpen  Generic drug:  Insulin Lispro Prot & Lispro  INJECT 90 UNITS BEFORE BREAKFAST AND 60 UNITS BEFORE EVENING MEAL     levothyroxine 200 MCG tablet  Commonly known as:  SYNTHROID, LEVOTHROID  Take 200 mcg by mouth daily.     losartan-hydrochlorothiazide 100-25 MG tablet   Commonly known as:  HYZAAR  Take 1 tablet by mouth daily.     metoprolol succinate 100 MG 24 hr tablet  Commonly known as:  TOPROL-XL  Take 100 mg by mouth 2 (two) times daily. Take with or immediately following a meal.     oxyCODONE-acetaminophen 5-325 MG tablet  Commonly known as:  PERCOCET/ROXICET  Take 1-2 tablets by mouth every 4 (four) hours as needed for moderate pain.        Review of Systems  Constitutional: Negative for fever, chills, activity change and fatigue.  HENT: Negative for congestion, rhinorrhea, sinus pressure, sneezing and sore throat.   Eyes: Negative.   Respiratory: Negative for cough, shortness of breath and wheezing.   Cardiovascular: Negative for chest pain, palpitations and leg swelling.  Gastrointestinal: Negative for nausea, vomiting, abdominal pain, constipation and abdominal distention.  Genitourinary: Negative for dysuria, urgency and frequency.  Musculoskeletal: Positive for gait problem.       Right thigh pain   Skin: Negative for color change, pallor and rash.       Lower back surgical incision   Neurological: Negative for dizziness, seizures, weakness, numbness and headaches.  Psychiatric/Behavioral: Negative for hallucinations, confusion, sleep disturbance and agitation.     There is no immunization history on file for this patient. Pertinent  Health Maintenance Due  Topic Date Due  . FOOT EXAM  07/26/1946  . OPHTHALMOLOGY EXAM  07/26/1946  . PNA vac Low Risk Adult (1 of 2 - PCV13) 07/27/2003  . INFLUENZA VACCINE  03/17/2016  . HEMOGLOBIN A1C  07/23/2016  . DEXA SCAN  Completed   No flowsheet data found. Functional Status Survey:    Filed Vitals:   02/17/16 1030  BP: 138/57  Pulse: 67  Temp: 98.2 F (36.8 C)  Resp: 18  Height: 5\' 4"  (1.626 m)  Weight: 237 lb 9.6 oz (107.775 kg)  SpO2: 98%   Body mass index is 40.76 kg/(m^2). Physical Exam  Constitutional: She is oriented to person, place, and time. She appears  well-developed and well-nourished. No distress.  HENT:  Head: Normocephalic.  Right Ear: External ear normal.  Mouth/Throat: Oropharynx is clear and moist. No oropharyngeal exudate.  Eyes: Conjunctivae and EOM are normal. Pupils are equal, round, and reactive to light. Right eye exhibits no discharge. Left eye exhibits no discharge. No scleral icterus.  Neck: Normal range of motion. No JVD present. No thyromegaly present.  Cardiovascular: Normal rate, regular rhythm, normal heart sounds and intact distal pulses.  Exam reveals no gallop and no friction rub.   No murmur heard. Pulmonary/Chest: Effort normal and breath  sounds normal. No respiratory distress. She has no wheezes. She has no rales.  Abdominal: Soft. Bowel sounds are normal. She exhibits no distension. There is no tenderness. There is no rebound and no guarding.  Musculoskeletal: She exhibits no edema or tenderness.  Wears back brace   Lymphadenopathy:    She has no cervical adenopathy.  Neurological: She is oriented to person, place, and time.  Skin: Skin is warm and dry. No rash noted. No erythema. No pallor.  Lower back surgical incision open to air dry, clean and intact. Surrounding skin without any redness, swelling or tenderness to touch.   Psychiatric: She has a normal mood and affect.    Labs reviewed:  Recent Labs  02/10/16 1356 02/12/16 0919 02/13/16 0228  NA 139 136 138  K 4.2 3.6 3.5  CL 105 102 100*  CO2 24 27 27   GLUCOSE 212* 252* 141*  BUN 15 18 13   CREATININE 1.13* 1.06* 0.93  CALCIUM 9.4 8.5* 8.6*    Recent Labs  02/12/16 0919 02/13/16 0228 02/14/16 0631  AST 183* 137* 85*  ALT 88* 93* 76*  ALKPHOS 82 92 101  BILITOT 1.2 1.3* 1.0  PROT 6.3* 6.8 6.4*  ALBUMIN 3.1* 3.2* 2.8*    Recent Labs  02/10/16 1356 02/13/16 0228 02/14/16 0631  WBC 7.5 13.3* 11.0*  HGB 12.9 11.7* 11.5*  HCT 39.5 35.7* 35.1*  MCV 93.8 96.2 96.7  PLT 276 213 242   Lab Results  Component Value Date   TSH 0.966  02/12/2016   Lab Results  Component Value Date   HGBA1C 9.7* 01/22/2016   Lab Results  Component Value Date   CHOL 149 09/13/2014   HDL 50 09/13/2014   LDLCALC 73 09/13/2014   TRIG 131 09/13/2014   CHOLHDL 3.0 09/13/2014    Significant Diagnostic Results in last 30 days:  Dg Chest 2 View  02/12/2016  CLINICAL DATA:  Fever for 2 days EXAM: CHEST  2 VIEW COMPARISON:  09/12/2014 chest CT. FINDINGS: Stable cardiomediastinal silhouette with mild cardiomegaly. No pneumothorax. No pleural effusion. No pulmonary edema. Stable small eventrations of the bilateral hemidiaphragms. Nonspecific hazy opacities in both lower lobes. IMPRESSION: Stable bilateral small diaphragmatic eventrations. Nonspecific hazy bilateral lower lobe opacities, favor atelectasis, cannot exclude a component of pneumonia. Stable mild cardiomegaly without pulmonary edema. Electronically Signed   By: Ilona Sorrel M.D.   On: 02/12/2016 13:25   Dg Lumbar Spine 2-3 Views  02/10/2016  CLINICAL DATA:  L3-4 laminectomy. EXAM: LUMBAR SPINE - 2-3 VIEW COMPARISON:  12/18/2015 FINDINGS: Examination demonstrates stable subtle grade 1 anterolisthesis of L3 on L4 with moderate facet arthropathy over the mid to lower lumbar spine. Mild spondylosis is present. Intervertebral cages present at the L4-5 level. Disc space narrowing at the L3-4 level. A metallic needle is present with tip projected over the superior aspect of the L4 spinous process. Subsequent film demonstrates surgical instrument with tip over the posterior elements at the L3-4 level. IMPRESSION: Mild spondylosis of the lumbar spine with stable grade 1 anterolisthesis of L3 on L4. Disc space narrowing at the L3-4 level and intervertebral cage at the L4-5 level. Surgical instrument over the posterior elements at the L3-4 level. Electronically Signed   By: Marin Olp M.D.   On: 02/10/2016 18:45   US Abdomen Limited Ruq  02/12/2016  CLINICAL DATA:  Abnormal liver function tests. EXAM:  US ABDOMEN LIMITED - RIGHT UPPER QUADRANT COMPARISON:  CT scan from 12/17/2014. FINDINGS: Gallbladder: Gallbladder is distended without  evidence of gallstones. Gallbladder wall is borderline thickened at 3 mm. The sonographer reports no sonographic Murphy sign. Common bile duct: Diameter: 7 mm Liver: Increased echogenicity with poor acoustic through transmission. Note:  3.1 cm lesion identified upper pole right kidney. IMPRESSION: 1. Borderline gallbladder wall thickening without evidence of gallstones or pericholecystic fluid. The sonographer reports no sonographic Murphy sign. There is clinical concern for cystic duct obstruction, nuclear scintigraphy may prove helpful to further evaluate. 2. Common bile duct diameter is upper limits of normal for patient age. 3. 3.1 cm lesion upper pole right kidney. 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 Electronically Signed   By: Misty Stanley M.D.   On: 02/12/2016 20:21    Assessment/Plan 1. Neuropathic pain of thigh, right Right thigh current pain regimen ineffective. Start Gabapentin 100 mg Tablet one by mouth twice daily  2. Abnormality of gait Continue with PT/OT for ROM, Exercise, gait stability and muscle strengthening. Fall and safety precautions.  3. S/p L4-L5 lumbar decompression  Incision site progressive healing without any signs of infections. Continue current pain regimen.     Family/ staff Communication: Reviewed plan of care with patient and facility Nurse supervisor  Labs/tests ordered:  None

## 2016-02-19 ENCOUNTER — Encounter: Payer: Self-pay | Admitting: Internal Medicine

## 2016-02-19 ENCOUNTER — Non-Acute Institutional Stay (SKILLED_NURSING_FACILITY): Payer: Medicare Other | Admitting: Internal Medicine

## 2016-02-19 DIAGNOSIS — I10 Essential (primary) hypertension: Secondary | ICD-10-CM

## 2016-02-19 DIAGNOSIS — D62 Acute posthemorrhagic anemia: Secondary | ICD-10-CM

## 2016-02-19 DIAGNOSIS — M4806 Spinal stenosis, lumbar region: Secondary | ICD-10-CM

## 2016-02-19 DIAGNOSIS — E785 Hyperlipidemia, unspecified: Secondary | ICD-10-CM | POA: Diagnosis not present

## 2016-02-19 DIAGNOSIS — R5381 Other malaise: Secondary | ICD-10-CM | POA: Diagnosis not present

## 2016-02-19 DIAGNOSIS — E669 Obesity, unspecified: Secondary | ICD-10-CM | POA: Diagnosis not present

## 2016-02-19 DIAGNOSIS — I63139 Cerebral infarction due to embolism of unspecified carotid artery: Secondary | ICD-10-CM

## 2016-02-19 DIAGNOSIS — E890 Postprocedural hypothyroidism: Secondary | ICD-10-CM

## 2016-02-19 DIAGNOSIS — R600 Localized edema: Secondary | ICD-10-CM | POA: Diagnosis not present

## 2016-02-19 DIAGNOSIS — E119 Type 2 diabetes mellitus without complications: Secondary | ICD-10-CM

## 2016-02-19 DIAGNOSIS — D72829 Elevated white blood cell count, unspecified: Secondary | ICD-10-CM

## 2016-02-19 DIAGNOSIS — M48062 Spinal stenosis, lumbar region with neurogenic claudication: Secondary | ICD-10-CM

## 2016-02-19 DIAGNOSIS — E1169 Type 2 diabetes mellitus with other specified complication: Secondary | ICD-10-CM

## 2016-02-19 NOTE — Progress Notes (Signed)
LOCATION: [facility]  PCP: Philis Fendt, MD   Code Status: Full Code   Goals of care: Advanced Directive information Advanced Directives 02/10/2016  Does patient have an advance directive? No  Does patient want to make changes to advanced directive? No - Patient declined  Would patient like information on creating an advanced directive? No - patient declined information       Extended Emergency Contact Information Primary Emergency Contact: Soto,Sheila Address: [address]          [place], [place] [address] [place] Phone: [phone] Work Phone: [phone] Mobile Phone: [phone] Relation: Daughter Secondary Emergency Contact: Jerilee Field Address: [place], [place] of [place] Phone: [phone] Relation: Son   Allergies  Allergen Reactions  . Darvon [Propoxyphene Hcl] Other (See Comments)    hallucinations  . Nyquil Multi-Symptom [Pseudoeph-Doxylamine-Dm-Apap] Other (See Comments)    Makes pt not "feel right in her head"    Chief Complaint  Patient presents with  . New Admit To SNF    New Admission     HPI:  Patient is a 80 y.o. female seen today for short term rehabilitation post hospital admission from 02/10/16-02/14/16 with lumbar spinal stenosis with radiculopathy. She underwent surgical decompression. Post op, she had confusion and elevated blood sugar reading. She underwent medical management for this. She is seen in her room today. She has history of CVA with left weakness, DM, HLD, HTN, hypothyroidism among others.   Review of Systems:  Constitutional: Negative for fever, chills, diaphoresis. Energy level is slowly coming back. HENT: Negative for headache, congestion, nasal discharge, sore throat, difficulty swallowing.   Eyes: Negative for blurred vision, double vision and discharge.  Respiratory: Negative for cough, shortness of breath and wheezing.     Cardiovascular: Negative for chest pain, palpitations, leg swelling.  Gastrointestinal: Negative for heartburn, nausea, vomiting, abdominal pain, loss of appetite, melena. Last bowel movement was today.  Genitourinary: Negative for dysuria and flank pain. chronic Incomplete emptying of the bladder. Musculoskeletal: Negative for fall in the facility. pain under control with current pain regimen Skin: Negative for itching, rash.  Neurological: Negative for dizziness. Psychiatric/Behavioral: Negative for depression   Past Medical History  Diagnosis Date  . Hypertension   . Hypothyroidism   . Diabetes mellitus     type !  . Cancer ([place])     thyroid cancer  . Spinal stenosis   . Stroke ([place]) 09/2014    left sided weakness  . Chronic kidney disease     questionable mass on kidney. Being followed by Dr Diona Fanti  . Arthritis     knees  . Full dentures    Past Surgical History  Procedure Laterality Date  . Thyroidectomy  2005  . Abdominal hysterectomy      partial  . Tonsillectomy    . Lumbar spine surgery    . Flexible sigmoidoscopy  03/29/2012    Procedure: FLEXIBLE SIGMOIDOSCOPY;  Surgeon: Jeryl Columbia, MD;  Location: [facility] ENDOSCOPY;  Service: Endoscopy;  Laterality: N/A;  fleet enema upon arrival  . Hot hemostasis  03/29/2012    Procedure: HOT HEMOSTASIS (ARGON PLASMA COAGULATION/BICAP);  Surgeon: Jeryl Columbia, MD;  Location: [facility] ENDOSCOPY;  Service: Endoscopy;  Laterality: N/A;  . Cataracts      Removed  11/2015  bilateral  . Lumbar laminectomy/decompression microdiscectomy N/A 02/10/2016    Procedure: Lumbar three- four  Laminectomy;  Surgeon: Kristeen Miss, MD;  Location: [facility] NEURO ORS;  Service: Neurosurgery;  Laterality: N/A;  L3-4 Laminectomy   Social History:   reports that she has never smoked. She does not have any smokeless tobacco history on file. She reports that she does not drink alcohol or use illicit drugs.  Family History  Problem Relation Age of Onset  . Heart  failure Mother   . Heart attack Father     Medications:   Medication List       This list is accurate as of: 02/19/16 12:35 PM.  Always use your most recent med list.               atorvastatin 80 MG tablet  Commonly known as:  LIPITOR  Take 80 mg by mouth daily.     clopidogrel 75 MG tablet  Commonly known as:  PLAVIX  Take 1 tablet (75 mg total) by mouth daily.     diltiazem 360 MG 24 hr capsule  Commonly known as:  TIAZAC  Take 360 mg by mouth daily. Reported on 09/19/2015     furosemide 40 MG tablet  Commonly known as:  LASIX  TAKE 1 TABLET BY MOUTH EVERY DAY AS NEEDED FOR LEG SWELLING     gabapentin 100 MG capsule  Commonly known as:  NEURONTIN  Take 1 capsule (100 mg total) by mouth 2 (two) times daily.     insulin lispro protamine-lispro (50-50) 100 UNIT/ML Susp injection  Commonly known as:  HUMALOG 50/50 MIX  Inject 60 Units into the skin. Before every evening meal     HUMALOG MIX 50/50 KWIKPEN (50-50) 100 UNIT/ML Kwikpen  Generic drug:  Insulin Lispro Prot & Lispro  INJECT 90 UNITS BEFORE BREAKFAST AND 60 UNITS BEFORE EVENING MEAL     levothyroxine 200 MCG tablet  Commonly known as:  SYNTHROID, LEVOTHROID  Take 200 mcg by mouth daily.     losartan-hydrochlorothiazide 100-25 MG tablet  Commonly known as:  HYZAAR  Take 1 tablet by mouth daily.     metoprolol succinate 100 MG 24 hr tablet  Commonly known as:  TOPROL-XL  Take 100 mg by mouth 2 (two) times daily. Take with or immediately following a meal.     oxyCODONE-acetaminophen 5-325 MG tablet  Commonly known as:  PERCOCET/ROXICET  Take 1-2 tablets by mouth every 4 (four) hours as needed for moderate pain.        Immunizations: Immunization History  Administered Date(s) Administered  . PPD Test 02/14/2016     Physical Exam: Filed Vitals:   02/19/16 1227  BP: 151/63  Pulse: 64  Temp: 98.6 F (37 C)  TempSrc: Oral  Resp: 18  Height: 5\' 4"  (1.626 m)  Weight: 238 lb (107.956 kg)  SpO2:  98%   Body mass index is 40.83 kg/(m^2).  General- elderly female, obese, in no acute distress Head- normocephalic, atraumatic Nose-  no nasal discharge Throat- moist mucus membrane, has dentures Eyes- PERRLA, EOMI, no pallor, no icterus, no discharge, normal conjunctiva, normal sclera Neck- no cervical lymphadenopathy Cardiovascular- normal s1,s2, no murmur, trace leg edema Respiratory- bilateral clear to auscultation, no wheeze, no rhonchi, no crackles, no use of accessory muscles Abdomen- bowel sounds present, soft, non tender Musculoskeletal- able to move all 4 extremities, generalized weakness to lower extremities, left > right, has her back brace Neurological- alert and oriented to person, place and time Skin- warm and dry. Lumbar surgical incision healing well, no drainage and is open to air  Psychiatry- normal mood and affect    Labs reviewed: Basic Metabolic Panel:  Recent Labs  02/10/16 1356 02/12/16 0919 02/13/16 0228  NA 139 136 138  K 4.2 3.6 3.5  CL 105 102 100*  CO2 24 27 27   GLUCOSE 212* 252* 141*  BUN 15 18 13   CREATININE 1.13* 1.06* 0.93  CALCIUM 9.4 8.5* 8.6*   Liver Function Tests:  Recent Labs  02/12/16 0919 02/13/16 0228 02/14/16 0631  AST 183* 137* 85*  ALT 88* 93* 76*  ALKPHOS 82 92 101  BILITOT 1.2 1.3* 1.0  PROT 6.3* 6.8 6.4*  ALBUMIN 3.1* 3.2* 2.8*    Recent Labs  09/19/15 1411  LIPASE 48   No results for input(s): AMMONIA in the last 8760 hours. CBC:  Recent Labs  02/10/16 1356 02/13/16 0228 02/14/16 0631  WBC 7.5 13.3* 11.0*  HGB 12.9 11.7* 11.5*  HCT 39.5 35.7* 35.1*  MCV 93.8 96.2 96.7  PLT 276 213 242   Cardiac Enzymes:  Recent Labs  02/12/16 1232  CKTOTAL 249*   BNP: Invalid input(s): POCBNP CBG:  Recent Labs  02/14/16 0615 02/14/16 1145 02/14/16 1650  GLUCAP 134* 154* 154*    Radiological Exams: Dg Chest 2 View  02/12/2016  CLINICAL DATA:  Fever for 2 days EXAM: CHEST  2 VIEW COMPARISON:   09/12/2014 chest CT. FINDINGS: Stable cardiomediastinal silhouette with mild cardiomegaly. No pneumothorax. No pleural effusion. No pulmonary edema. Stable small eventrations of the bilateral hemidiaphragms. Nonspecific hazy opacities in both lower lobes. IMPRESSION: Stable bilateral small diaphragmatic eventrations. Nonspecific hazy bilateral lower lobe opacities, favor atelectasis, cannot exclude a component of pneumonia. Stable mild cardiomegaly without pulmonary edema. Electronically Signed   By: Ilona Sorrel M.D.   On: 02/12/2016 13:25   Dg Lumbar Spine 2-3 Views  02/10/2016  CLINICAL DATA:  L3-4 laminectomy. EXAM: LUMBAR SPINE - 2-3 VIEW COMPARISON:  12/18/2015 FINDINGS: Examination demonstrates stable subtle grade 1 anterolisthesis of L3 on L4 with moderate facet arthropathy over the mid to lower lumbar spine. Mild spondylosis is present. Intervertebral cages present at the L4-5 level. Disc space narrowing at the L3-4 level. A metallic needle is present with tip projected over the superior aspect of the L4 spinous process. Subsequent film demonstrates surgical instrument with tip over the posterior elements at the L3-4 level. IMPRESSION: Mild spondylosis of the lumbar spine with stable grade 1 anterolisthesis of L3 on L4. Disc space narrowing at the L3-4 level and intervertebral cage at the L4-5 level. Surgical instrument over the posterior elements at the L3-4 level. Electronically Signed   By: Marin Olp M.D.   On: 02/10/2016 18:45   US Abdomen Limited Ruq  02/12/2016  CLINICAL DATA:  Abnormal liver function tests. EXAM: US ABDOMEN LIMITED - RIGHT UPPER QUADRANT COMPARISON:  CT scan from 12/17/2014. FINDINGS: Gallbladder: Gallbladder is distended without evidence of gallstones. Gallbladder wall is borderline thickened at 3 mm. The sonographer reports no sonographic Murphy sign. Common bile duct: Diameter: 7 mm Liver: Increased echogenicity with poor acoustic through transmission. Note:  3.1 cm  lesion identified upper pole right kidney. IMPRESSION: 1. Borderline gallbladder wall thickening without evidence of gallstones or pericholecystic fluid. The sonographer reports no sonographic Murphy sign. There is clinical concern for cystic duct obstruction, nuclear scintigraphy may prove helpful to further evaluate. 2. Common bile duct diameter is upper limits of normal for patient age. 3. 3.1 cm lesion upper pole right kidney. This was better evaluated by MRI on 07/19/2015 which showed an enhancing  lesion suspicious for papillary renal cell cancer. Electronically Signed   By: Misty Stanley M.D.   On: 02/12/2016 20:21    Assessment/Plan  Physical deconditioning Will have patient work with PT/OT as tolerated to regain strength and restore function.  Fall precautions are in place.  Lumbar stenosis with radiculopathy Continue oxycodone-acetaminophen 5-325 mg 1-2 tab q4h prn pain. Will have her work with physical therapy and occupational therapy team to help with gait training and muscle strengthening exercises.fall precautions. Skin care. Encourage to be out of bed. Back precautions. Has orthopedics follow up. Continue gabapentin 100 mg bid  Blood loss anemia Post op, monitor cbc  Leukocytosis Afebrile and surgical incision healing well. Monitor wbc and temp curve  Chronic leg edema Stable, on lasix 40 mg daily as needed. Monitor weight 3 days a week  History of CVA With left sided weakness. Continue plavix for now and bp med.   HTN Elevated bp reading. Continue diltiazem 360 mg daily and toprol xl 100 mg bid with losartan-hctz 100-25 mg daily, check bp bid for now  HLD Continue atorvastatin and monitor  DM Lab Results  Component Value Date   HGBA1C 9.7* 01/22/2016   Monitor cbg. Continue humalog 50/50 60 u before dinner and 90 u before breakfast  Hypothyroidism Continue her levothyroxine   Goals of care: short term rehabilitation   Labs/tests ordered: cbc, cmp  02/20/16  Family/ staff Communication: reviewed care plan with patient and nursing supervisor    Blanchie Serve, MD Internal Medicine [place], [place] [address] Cell Phone (Monday-Friday 8 am - 5 pm): [phone] On Call: [phone] and follow prompts after 5 pm and on weekends Office Phone: [phone] Office Fax: [phone]

## 2016-02-26 ENCOUNTER — Encounter: Payer: Self-pay | Admitting: Family

## 2016-02-26 ENCOUNTER — Non-Acute Institutional Stay (SKILLED_NURSING_FACILITY): Payer: Medicare Other | Admitting: Family

## 2016-02-26 DIAGNOSIS — E119 Type 2 diabetes mellitus without complications: Secondary | ICD-10-CM | POA: Diagnosis not present

## 2016-02-26 DIAGNOSIS — I639 Cerebral infarction, unspecified: Secondary | ICD-10-CM | POA: Diagnosis not present

## 2016-02-26 DIAGNOSIS — E039 Hypothyroidism, unspecified: Secondary | ICD-10-CM | POA: Diagnosis not present

## 2016-02-26 DIAGNOSIS — I10 Essential (primary) hypertension: Secondary | ICD-10-CM

## 2016-02-26 DIAGNOSIS — E785 Hyperlipidemia, unspecified: Secondary | ICD-10-CM

## 2016-02-26 DIAGNOSIS — Z9889 Other specified postprocedural states: Secondary | ICD-10-CM

## 2016-02-26 DIAGNOSIS — M792 Neuralgia and neuritis, unspecified: Secondary | ICD-10-CM | POA: Diagnosis not present

## 2016-02-26 NOTE — Progress Notes (Deleted)
Location:   [facility] Room Number: 1204-P Place of Service:  SNF (31)  Provider: Marlowe Sax NP PCP: Philis Fendt, MD Patient Care Team: Nolene Ebbs, MD as PCP - General  Extended Emergency Contact Information Primary Emergency Contact: Soto,Sheila Address: [address]          [place], [place] [address] [place] Phone: [phone] Work Phone: [phone] Mobile Phone: [phone] Relation: Daughter Secondary Emergency Contact: Jerilee Field Address: [place], [place] Phone: [phone] Relation: Son  Code Status: Full Code Goals of care:  Advanced Directive information Advanced Directives 02/10/2016  Does patient have an advance directive? No  Does patient want to make changes to advanced directive? No - Patient declined  Would patient like information on creating an advanced directive? No - patient declined information     Allergies  Allergen Reactions  . Darvon [Propoxyphene Hcl] Other (See Comments)    hallucinations  . Nyquil Multi-Symptom [Pseudoeph-Doxylamine-Dm-Apap] Other (See Comments)    Makes pt not "feel right in her head"    Chief Complaint  Patient presents with  . Discharge Note    HPI:  80 y.o. female      Past Medical History  Diagnosis Date  . Hypertension   . Hypothyroidism   . Diabetes mellitus     type !  . Cancer ([place])     thyroid cancer  . Spinal stenosis   . Stroke ([place]) 09/2014    left sided weakness  . Chronic kidney disease     questionable mass on kidney. Being followed by Dr Diona Fanti  . Arthritis     knees  . Full dentures     Past Surgical History  Procedure Laterality Date  . Thyroidectomy  2005  . Abdominal hysterectomy      partial  . Tonsillectomy    . Lumbar spine surgery    . Flexible sigmoidoscopy  03/29/2012    Procedure: FLEXIBLE SIGMOIDOSCOPY;  Surgeon: Jeryl Columbia, MD;  Location: [facility]  ENDOSCOPY;  Service: Endoscopy;  Laterality: N/A;  fleet enema upon arrival  . Hot hemostasis  03/29/2012    Procedure: HOT HEMOSTASIS (ARGON PLASMA COAGULATION/BICAP);  Surgeon: Jeryl Columbia, MD;  Location: [facility] ENDOSCOPY;  Service: Endoscopy;  Laterality: N/A;  . Cataracts      Removed  11/2015  bilateral  . Lumbar laminectomy/decompression microdiscectomy N/A 02/10/2016    Procedure: Lumbar three- four Laminectomy;  Surgeon: Kristeen Miss, MD;  Location: [facility] NEURO ORS;  Service: Neurosurgery;  Laterality: N/A;  L3-4 Laminectomy      reports that she has never smoked. She does not have any smokeless tobacco history on file. She reports that she does not drink alcohol or use illicit drugs. Social History   Social History  . Marital Status: Widowed    Spouse Name: N/A  . Number of Children: N/A  . Years of Education: N/A   Occupational History  . retired     Buyer, retail   Social History Main Topics  . Smoking status: Never Smoker   . Smokeless tobacco: Not on file  . Alcohol Use: No  . Drug Use: No  . Sexual Activity: Not on file   Other Topics Concern  . Not on file   Social History Narrative   Separated, retired, 3 children living, 2 children deceased   Caffeine use - soda every few days  Right handed   Pt lives alone.  Using cane when out and about.    Functional Status Survey:    Allergies  Allergen Reactions  . Darvon [Propoxyphene Hcl] Other (See Comments)    hallucinations  . Nyquil Multi-Symptom [Pseudoeph-Doxylamine-Dm-Apap] Other (See Comments)    Makes pt not "feel right in her head"    Pertinent  Health Maintenance Due  Topic Date Due  . FOOT EXAM  07/26/1946  . OPHTHALMOLOGY EXAM  07/26/1946  . PNA vac Low Risk Adult (1 of 2 - PCV13) 07/27/2003  . INFLUENZA VACCINE  03/17/2016  . HEMOGLOBIN A1C  07/23/2016  . DEXA SCAN  Completed    Medications:   Medication List       This list is accurate as of: 02/26/16  9:21 AM.  Always use your most  recent med list.               atorvastatin 80 MG tablet  Commonly known as:  LIPITOR  Take 80 mg by mouth daily.     clopidogrel 75 MG tablet  Commonly known as:  PLAVIX  Take 1 tablet (75 mg total) by mouth daily.     diltiazem 360 MG 24 hr capsule  Commonly known as:  TIAZAC  Take 360 mg by mouth daily. Reported on 09/19/2015     furosemide 40 MG tablet  Commonly known as:  LASIX  TAKE 1 TABLET BY MOUTH EVERY DAY AS NEEDED FOR LEG SWELLING     gabapentin 100 MG capsule  Commonly known as:  NEURONTIN  Take 100 mg by mouth 2 (two) times daily. For neuropathic pain     insulin lispro protamine-lispro (50-50) 100 UNIT/ML Susp injection  Commonly known as:  HUMALOG 50/50 MIX  Inject 60 Units into the skin. Before every evening meal     HUMALOG MIX 50/50 KWIKPEN (50-50) 100 UNIT/ML Kwikpen  Generic drug:  Insulin Lispro Prot & Lispro  INJECT 90 UNITS BEFORE BREAKFAST AND 60 UNITS BEFORE EVENING MEAL     levothyroxine 200 MCG tablet  Commonly known as:  SYNTHROID, LEVOTHROID  Take 200 mcg by mouth daily. For hypothyroidism     losartan-hydrochlorothiazide 100-25 MG tablet  Commonly known as:  HYZAAR  Take 1 tablet by mouth daily. For hypertension     metoprolol succinate 100 MG 24 hr tablet  Commonly known as:  TOPROL-XL  Take 100 mg by mouth 2 (two) times daily. Take with or immediately following a meal.     oxyCODONE-acetaminophen 5-325 MG tablet  Commonly known as:  PERCOCET/ROXICET  Take 1-2 tablets by mouth every 4 (four) hours as needed for moderate pain.        Review of Systems  There were no vitals filed for this visit. There is no weight on file to calculate BMI. Physical Exam  Labs reviewed: Basic Metabolic Panel:  Recent Labs  02/10/16 1356 02/12/16 0919 02/13/16 0228  NA 139 136 138  K 4.2 3.6 3.5  CL 105 102 100*  CO2 24 27 27   GLUCOSE 212* 252* 141*  BUN 15 18 13   CREATININE 1.13* 1.06* 0.93  CALCIUM 9.4 8.5* 8.6*   Liver Function  Tests:  Recent Labs  02/12/16 0919 02/13/16 0228 02/14/16 0631  AST 183* 137* 85*  ALT 88* 93* 76*  ALKPHOS 82 92 101  BILITOT 1.2 1.3* 1.0  PROT 6.3* 6.8 6.4*  ALBUMIN 3.1* 3.2* 2.8*    Recent Labs  09/19/15 1411  LIPASE 48  No results for input(s): AMMONIA in the last 8760 hours. CBC:  Recent Labs  02/10/16 1356 02/13/16 0228 02/14/16 0631  WBC 7.5 13.3* 11.0*  HGB 12.9 11.7* 11.5*  HCT 39.5 35.7* 35.1*  MCV 93.8 96.2 96.7  PLT 276 213 242   Cardiac Enzymes:  Recent Labs  02/12/16 1232  CKTOTAL 249*   BNP: Invalid input(s): POCBNP CBG:  Recent Labs  02/14/16 0615 02/14/16 1145 02/14/16 1650  GLUCAP 134* 154* 154*    Procedures and Imaging Studies During Stay: Dg Chest 2 View  02/12/2016  CLINICAL DATA:  Fever for 2 days EXAM: CHEST  2 VIEW COMPARISON:  09/12/2014 chest CT. FINDINGS: Stable cardiomediastinal silhouette with mild cardiomegaly. No pneumothorax. No pleural effusion. No pulmonary edema. Stable small eventrations of the bilateral hemidiaphragms. Nonspecific hazy opacities in both lower lobes. IMPRESSION: Stable bilateral small diaphragmatic eventrations. Nonspecific hazy bilateral lower lobe opacities, favor atelectasis, cannot exclude a component of pneumonia. Stable mild cardiomegaly without pulmonary edema. Electronically Signed   By: Ilona Sorrel M.D.   On: 02/12/2016 13:25   Dg Lumbar Spine 2-3 Views  02/10/2016  CLINICAL DATA:  L3-4 laminectomy. EXAM: LUMBAR SPINE - 2-3 VIEW COMPARISON:  12/18/2015 FINDINGS: Examination demonstrates stable subtle grade 1 anterolisthesis of L3 on L4 with moderate facet arthropathy over the mid to lower lumbar spine. Mild spondylosis is present. Intervertebral cages present at the L4-5 level. Disc space narrowing at the L3-4 level. A metallic needle is present with tip projected over the superior aspect of the L4 spinous process. Subsequent film demonstrates surgical instrument with tip over the posterior  elements at the L3-4 level. IMPRESSION: Mild spondylosis of the lumbar spine with stable grade 1 anterolisthesis of L3 on L4. Disc space narrowing at the L3-4 level and intervertebral cage at the L4-5 level. Surgical instrument over the posterior elements at the L3-4 level. Electronically Signed   By: Marin Olp M.D.   On: 02/10/2016 18:45   US Abdomen Limited Ruq  02/12/2016  CLINICAL DATA:  Abnormal liver function tests. EXAM: US ABDOMEN LIMITED - RIGHT UPPER QUADRANT COMPARISON:  CT scan from 12/17/2014. FINDINGS: Gallbladder: Gallbladder is distended without evidence of gallstones. Gallbladder wall is borderline thickened at 3 mm. The sonographer reports no sonographic Murphy sign. Common bile duct: Diameter: 7 mm Liver: Increased echogenicity with poor acoustic through transmission. Note:  3.1 cm lesion identified upper pole right kidney. IMPRESSION: 1. Borderline gallbladder wall thickening without evidence of gallstones or pericholecystic fluid. The sonographer reports no sonographic Murphy sign. There is clinical concern for cystic duct obstruction, nuclear scintigraphy may prove helpful to further evaluate. 2. Common bile duct diameter is upper limits of normal for patient age. 3. 3.1 cm lesion upper pole right kidney. 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 Electronically Signed   By: Misty Stanley M.D.   On: 02/12/2016 20:21    Assessment/Plan:   There are no diagnoses linked to this encounter.   Patient is being discharged with the following home health services:    Patient is being discharged with the following durable medical equipment:    Patient has been advised to f/u with their PCP in 1-2 weeks to bring them up to date on their rehab stay.  Social services at facility was responsible for arranging this appointment.  Pt was provided with a 30 day supply of prescriptions for medications and refills  must be obtained from their PCP.  For controlled substances, a more limited supply may be provided adequate until PCP appointment only.  Future labs/tests needed:

## 2016-02-26 NOTE — Progress Notes (Signed)
Location:     [place] Room Number: 1204-P Place of Service:  SNF (31)  Provider:Dylynn Ketner FNP-C  PCP: Philis Fendt, MD Patient Care Team: Nolene Ebbs, MD as PCP - General  Extended Emergency Contact Information Primary Emergency Contact: Soto,Sheila Address: [address]          [place], [place] [address] Johnnette Litter of [place] Phone: [phone] Work Phone: [phone] Mobile Phone: [phone] Relation: Daughter Secondary Emergency Contact: Jerilee Field Address: [place], [place] Phone: [phone] Relation: Son  Code Status:Full Code Goals of care:  Advanced Directive information Advanced Directives 02/26/2016  Does patient have an advance directive? No  Does patient want to make changes to advanced directive? No - Patient declined  Would patient like information on creating an advanced directive? No - patient declined information     Allergies  Allergen Reactions  . Darvon [Propoxyphene Hcl] Other (See Comments)    hallucinations  . Nyquil Multi-Symptom [Pseudoeph-Doxylamine-Dm-Apap] Other (See Comments)    Makes pt not "feel right in her head"    Chief Complaint  Patient presents with  . Discharge Note    HPI:  80 y.o. female seen today at [facility] for discharge home. She was here for  short term rehabilitation post hospital admission from 02/10/16-02/14/16 with lumbar spinal stenosis with radiculopathy. She underwent L4-L5 surgical decompression. Post op, she had confusion and elevated blood sugar reading. She has a medical history of HTN, Hypothyroidism, Type 2 DM, CVA, CKD among other conditions. She is seen in her room today. She states pain under control with current regimen. She denies any acute issues this visit. Facility staff reports patient's CBG's ranging from 150's-350. Her systolic blood pressure runs  170's-180's. She denies any headache, dizziness, nausea, vomiting or chest pain.Will adjust her medication this visit. She has worked with PT/OT now stable for discharge home.She will be discharged home with Home health PT/OT to continue with ROM, Exercise, Gait stability and muscle strengthening. She will require DME FWW to allow her to maintain current level of independence with ADL's. Home health services will be arranged by facility social worker prior to discharge. Prescription medication will be written x 1 month then patient to follow up with PCP in 1-2 weeks.    Past Medical History  Diagnosis Date  . Hypertension   . Hypothyroidism   . Diabetes mellitus     type !  . Cancer ([place])     thyroid cancer  . Spinal stenosis   . Stroke ([place]) 09/2014    left sided weakness  . Chronic kidney disease     questionable mass on kidney. Being followed by Dr Diona Fanti  . Arthritis     knees  . Full dentures     Past Surgical History  Procedure Laterality Date  . Thyroidectomy  2005  . Abdominal hysterectomy      partial  . Tonsillectomy    . Lumbar spine surgery    . Flexible sigmoidoscopy  03/29/2012    Procedure: FLEXIBLE SIGMOIDOSCOPY;  Surgeon: Jeryl Columbia, MD;  Location: [facility] ENDOSCOPY;  Service: Endoscopy;  Laterality: N/A;  fleet enema upon arrival  . Hot hemostasis  03/29/2012    Procedure: HOT HEMOSTASIS (ARGON PLASMA COAGULATION/BICAP);  Surgeon: Jeryl Columbia, MD;  Location: [facility] ENDOSCOPY;  Service: Endoscopy;  Laterality: N/A;  . Cataracts  Removed  11/2015  bilateral  . Lumbar laminectomy/decompression microdiscectomy N/A 02/10/2016    Procedure: Lumbar three- four Laminectomy;  Surgeon: Kristeen Miss, MD;  Location: [facility] NEURO ORS;  Service: Neurosurgery;  Laterality: N/A;  L3-4 Laminectomy      reports that she has never smoked. She does not have any smokeless tobacco history on file. She reports that she does not drink alcohol or use illicit drugs. Social History   Social  History  . Marital Status: Widowed    Spouse Name: N/A  . Number of Children: N/A  . Years of Education: N/A   Occupational History  . retired     Buyer, retail   Social History Main Topics  . Smoking status: Never Smoker   . Smokeless tobacco: Not on file  . Alcohol Use: No  . Drug Use: No  . Sexual Activity: Not on file   Other Topics Concern  . Not on file   Social History Narrative   Separated, retired, 3 children living, 2 children deceased   Caffeine use - soda every few days   Right handed   Pt lives alone.  Using cane when out and about.    Functional Status Survey:    Allergies  Allergen Reactions  . Darvon [Propoxyphene Hcl] Other (See Comments)    hallucinations  . Nyquil Multi-Symptom [Pseudoeph-Doxylamine-Dm-Apap] Other (See Comments)    Makes pt not "feel right in her head"    Pertinent  Health Maintenance Due  Topic Date Due  . FOOT EXAM  07/26/1946  . OPHTHALMOLOGY EXAM  07/26/1946  . PNA vac Low Risk Adult (1 of 2 - PCV13) 07/27/2003  . INFLUENZA VACCINE  03/17/2016  . HEMOGLOBIN A1C  07/23/2016  . DEXA SCAN  Completed    Medications:   Medication List       This list is accurate as of: 02/26/16  9:47 AM.  Always use your most recent med list.               atorvastatin 80 MG tablet  Commonly known as:  LIPITOR  Take 80 mg by mouth daily.     clopidogrel 75 MG tablet  Commonly known as:  PLAVIX  Take 1 tablet (75 mg total) by mouth daily.     diltiazem 360 MG 24 hr capsule  Commonly known as:  TIAZAC  Take 360 mg by mouth daily. Reported on 09/19/2015     furosemide 40 MG tablet  Commonly known as:  LASIX  TAKE 1 TABLET BY MOUTH EVERY DAY AS NEEDED FOR LEG SWELLING     gabapentin 100 MG capsule  Commonly known as:  NEURONTIN  Take 100 mg by mouth 2 (two) times daily. For neuropathic pain     insulin lispro protamine-lispro (50-50) 100 UNIT/ML Susp injection  Commonly known as:  HUMALOG 50/50 MIX  Inject 60 Units into  the skin. Before every evening meal     HUMALOG MIX 50/50 KWIKPEN (50-50) 100 UNIT/ML Kwikpen  Generic drug:  Insulin Lispro Prot & Lispro  INJECT 90 UNITS BEFORE BREAKFAST AND 60 UNITS BEFORE EVENING MEAL     levothyroxine 200 MCG tablet  Commonly known as:  SYNTHROID, LEVOTHROID  Take 200 mcg by mouth daily. For hypothyroidism     losartan-hydrochlorothiazide 100-25 MG tablet  Commonly known as:  HYZAAR  Take 1 tablet by mouth daily. For hypertension     metoprolol succinate 100 MG 24 hr tablet  Commonly known as:  TOPROL-XL  Take 100  mg by mouth 2 (two) times daily. Take with or immediately following a meal.     oxyCODONE-acetaminophen 5-325 MG tablet  Commonly known as:  PERCOCET/ROXICET  Take 1-2 tablets by mouth every 4 (four) hours as needed for moderate pain.        Review of Systems  Constitutional: Negative for fever, chills, activity change and fatigue.  HENT: Negative for congestion, rhinorrhea, sinus pressure, sneezing and sore throat.   Eyes: Negative.   Respiratory: Negative for cough, shortness of breath and wheezing.   Cardiovascular: Negative for chest pain, palpitations and leg swelling.  Gastrointestinal: Negative for nausea, vomiting, abdominal pain, constipation and abdominal distention.  Genitourinary: Negative for dysuria, urgency and frequency.  Musculoskeletal: Positive for gait problem.       Right thigh pain under control with current pain regimen    Skin: Negative for color change, pallor and rash.       Lower back surgical incision   Neurological: Negative for dizziness, seizures, weakness, numbness and headaches.  Psychiatric/Behavioral: Negative for hallucinations, confusion, sleep disturbance and agitation. The patient is not nervous/anxious.     Filed Vitals:   02/26/16 0942  BP: 175/72  Pulse: 59  Temp: 96.6 F (35.9 C)  Resp: 20  Height: 5\' 4"  (1.626 m)  Weight: 238 lb (107.956 kg)  SpO2: 94%   Body mass index is 40.83  kg/(m^2). Physical Exam  Constitutional: She is oriented to person, place, and time. She appears well-developed and well-nourished. No distress.  HENT:  Head: Normocephalic.  Right Ear: External ear normal.  Mouth/Throat: Oropharynx is clear and moist. No oropharyngeal exudate.  Eyes: Conjunctivae and EOM are normal. Pupils are equal, round, and reactive to light. Right eye exhibits no discharge. Left eye exhibits no discharge. No scleral icterus.  Neck: Normal range of motion. No JVD present. No thyromegaly present.  Cardiovascular: Normal rate, regular rhythm, normal heart sounds and intact distal pulses.  Exam reveals no gallop and no friction rub.   No murmur heard. Pulmonary/Chest: Effort normal and breath sounds normal. No respiratory distress. She has no wheezes. She has no rales.  Abdominal: Soft. Bowel sounds are normal. She exhibits no distension. There is no tenderness. There is no rebound and no guarding.  Musculoskeletal: She exhibits no edema or tenderness.  Moves x 4 extremities. Abnormal gait   Lymphadenopathy:    She has no cervical adenopathy.  Neurological: She is oriented to person, place, and time.  Skin: Skin is warm and dry. No rash noted. No erythema. No pallor.  Lower back surgical incision open to air dry, clean and intact. Surrounding skin without any redness, swelling or tenderness to touch.   Psychiatric: She has a normal mood and affect.    Labs reviewed: Basic Metabolic Panel:  Recent Labs  02/10/16 1356 02/12/16 0919 02/13/16 0228  NA 139 136 138  K 4.2 3.6 3.5  CL 105 102 100*  CO2 24 27 27   GLUCOSE 212* 252* 141*  BUN 15 18 13   CREATININE 1.13* 1.06* 0.93  CALCIUM 9.4 8.5* 8.6*   Liver Function Tests:  Recent Labs  02/12/16 0919 02/13/16 0228 02/14/16 0631  AST 183* 137* 85*  ALT 88* 93* 76*  ALKPHOS 82 92 101  BILITOT 1.2 1.3* 1.0  PROT 6.3* 6.8 6.4*  ALBUMIN 3.1* 3.2* 2.8*    Recent Labs  09/19/15 1411  LIPASE 48   No  results for input(s): AMMONIA in the last 8760 hours. CBC:  Recent Labs  02/10/16  1356 02/13/16 0228 02/14/16 0631  WBC 7.5 13.3* 11.0*  HGB 12.9 11.7* 11.5*  HCT 39.5 35.7* 35.1*  MCV 93.8 96.2 96.7  PLT 276 213 242   Cardiac Enzymes:  Recent Labs  02/12/16 1232  CKTOTAL 249*   BNP: Invalid input(s): POCBNP CBG:  Recent Labs  02/14/16 0615 02/14/16 1145 02/14/16 1650  GLUCAP 134* 154* 154*    Procedures and Imaging Studies During Stay: Dg Chest 2 View  02/12/2016  CLINICAL DATA:  Fever for 2 days EXAM: CHEST  2 VIEW COMPARISON:  09/12/2014 chest CT. FINDINGS: Stable cardiomediastinal silhouette with mild cardiomegaly. No pneumothorax. No pleural effusion. No pulmonary edema. Stable small eventrations of the bilateral hemidiaphragms. Nonspecific hazy opacities in both lower lobes. IMPRESSION: Stable bilateral small diaphragmatic eventrations. Nonspecific hazy bilateral lower lobe opacities, favor atelectasis, cannot exclude a component of pneumonia. Stable mild cardiomegaly without pulmonary edema. Electronically Signed   By: Ilona Sorrel M.D.   On: 02/12/2016 13:25   Dg Lumbar Spine 2-3 Views  02/10/2016  CLINICAL DATA:  L3-4 laminectomy. EXAM: LUMBAR SPINE - 2-3 VIEW COMPARISON:  12/18/2015 FINDINGS: Examination demonstrates stable subtle grade 1 anterolisthesis of L3 on L4 with moderate facet arthropathy over the mid to lower lumbar spine. Mild spondylosis is present. Intervertebral cages present at the L4-5 level. Disc space narrowing at the L3-4 level. A metallic needle is present with tip projected over the superior aspect of the L4 spinous process. Subsequent film demonstrates surgical instrument with tip over the posterior elements at the L3-4 level. IMPRESSION: Mild spondylosis of the lumbar spine with stable grade 1 anterolisthesis of L3 on L4. Disc space narrowing at the L3-4 level and intervertebral cage at the L4-5 level. Surgical instrument over the posterior  elements at the L3-4 level. Electronically Signed   By: Marin Olp M.D.   On: 02/10/2016 18:45   US Abdomen Limited Ruq  02/12/2016  CLINICAL DATA:  Abnormal liver function tests. EXAM: US ABDOMEN LIMITED - RIGHT UPPER QUADRANT COMPARISON:  CT scan from 12/17/2014. FINDINGS: Gallbladder: Gallbladder is distended without evidence of gallstones. Gallbladder wall is borderline thickened at 3 mm. The sonographer reports no sonographic Murphy sign. Common bile duct: Diameter: 7 mm Liver: Increased echogenicity with poor acoustic through transmission. Note:  3.1 cm lesion identified upper pole right kidney. IMPRESSION: 1. Borderline gallbladder wall thickening without evidence of gallstones or pericholecystic fluid. The sonographer reports no sonographic Murphy sign. There is clinical concern for cystic duct obstruction, nuclear scintigraphy may prove helpful to further evaluate. 2. Common bile duct diameter is upper limits of normal for patient age. 3. 3.1 cm lesion upper pole right kidney. 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 Electronically Signed   By: Misty Stanley M.D.   On: 02/12/2016 20:21    Assessment/Plan:   HTN Sys B/p elevated in the 170's-180's. Asymptomatic. Continue on Metoprolol 100 mg tablet, Hyzaar 100-25 mg Tablet and Diltiazem 360 mg Tablet. Add Hydralazine 10 mg tablet three times daily. Instructed to check blood pressure twice daily and record log for evaluation with PCP. She verbalized understanding. BMP in 1-2 PCP  Hypothyroidism  Continue on Levothyroxine 200 mcg tablet. TSH level with PCP   Hyperlipidemia Continue on Atorvastatin 80 mg Tablet and heart healthy diet.   Type 2 DM CBG's ranging 150's-300.Elevated post -op.Continue on Humalog Mix 50/50 KwiKPen  90 units before breakfast . Increase Humalog 50/50 KWIKPen to 64 units in  the evening.    CVA Continue on Plavix 75 mg Tablet and  Atorvastatin 80 mg Tablet. Continue to monitor and control Blood pressure.   Neuropathic pain  Continue on Gabapentin 100 mg Capsule.  Status post Lumbar Decompression  Status post short term rehabilitation post hospital admission from 02/10/16-02/14/16 with lumbar spinal stenosis with radiculopathy. She underwent L4-L5 surgical decompression.Incision progressive healing without any signs of infections. She has worked well with PT/OT stable for discharge on on PT/OT to continue with ROM, Exercise, Gait stability and muscle strengthening.    Abnormal Gait   Ambulates with front wheel walker. Fall and safety precautions.   Patient is being discharged with the following home health services:    PT/OT to continue with ROM, Exercise, Gait stability and muscle strengthening.   Patient is being discharged with the following durable medical equipment:    DME FWW  to allow her to maintain current level of independence with ADL's.  Prescription medication  written x 1 month supply.    Patient has been advised to f/u with their PCP in 1-2 weeks to bring them up to date on their rehab stay.  Social services at facility was responsible for arranging this appointment.  Pt was provided with a 30 day supply of prescriptions for medications and refills must be obtained from their PCP.  For controlled substances, a more limited supply may be provided adequate until PCP appointment only.  Future labs/tests needed:  CBC, BMP in 1-2 weeks with PCP

## 2016-03-02 ENCOUNTER — Other Ambulatory Visit: Payer: Self-pay | Admitting: Urology

## 2016-03-02 DIAGNOSIS — R1900 Intra-abdominal and pelvic swelling, mass and lump, unspecified site: Secondary | ICD-10-CM

## 2016-03-06 IMAGING — MR MR MRA HEAD W/O CM
10 of 12 series · 30 of 48 positions shown · non-contrast
Comparison: Head CT 09/11/2014

CLINICAL DATA: Severe left-sided headache with left-sided numbness
and tingling. Transient blurred vision. History of hypertension and
diabetes.

EXAM:
MRI HEAD WITHOUT CONTRAST
MRA HEAD WITHOUT CONTRAST
TECHNIQUE: Multiplanar, multiecho pulse sequences of the brain and surrounding
structures were obtained without intravenous contrast. Angiographic
images of the head were obtained using MRA technique without
contrast.

[Series 3: DWI · axial · 3.0mm · 1.09mm/px · z∈[-48,+84]mm · 6 of 90 slices shown (1 of 4)]
[im 1/90]
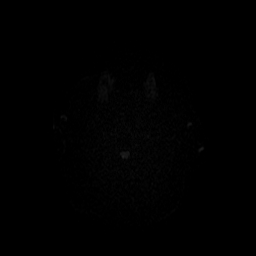
[im 18/90]
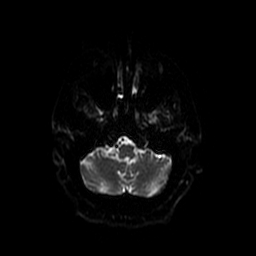
[im 36/90]
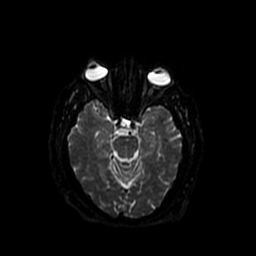
[im 54/90]
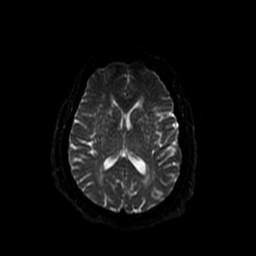
[im 72/90]
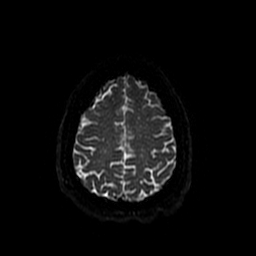
[im 90/90]
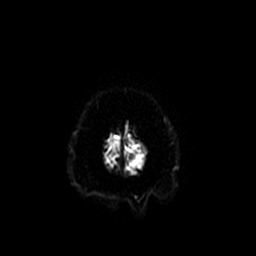

[Series 5: (id) mt fs · axial · 1.4mm · 0.43mm/px · z∈[-64,-35]mm · 3 of 152 slices shown]
[im 1/152]
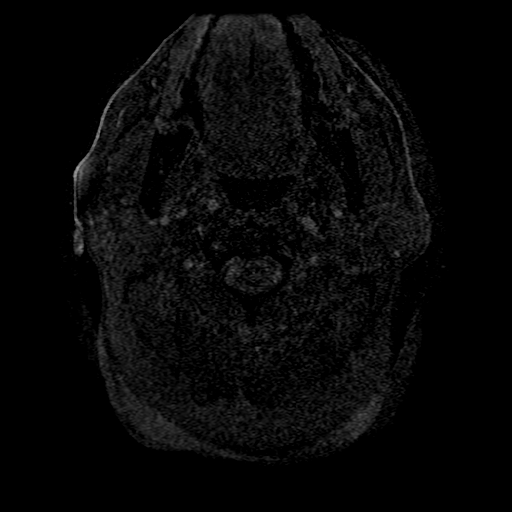
[im 28/152]
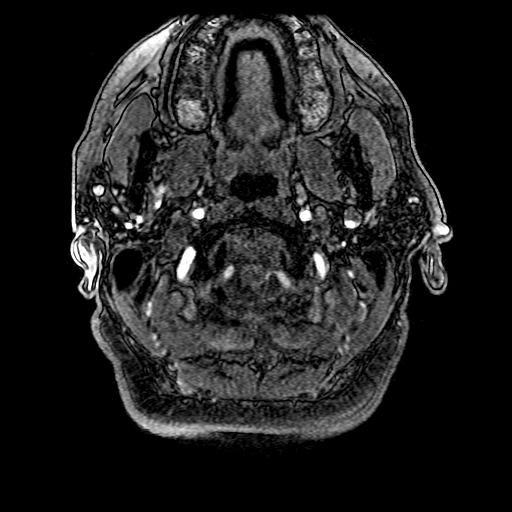
[im 42/152]
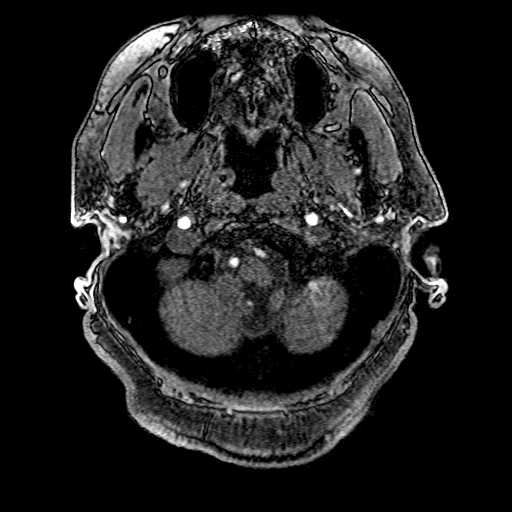

[Series 6: T1 · sagittal · 5.0mm · 0.47mm/px · 2 of 23 slices shown]
[im 1/23]
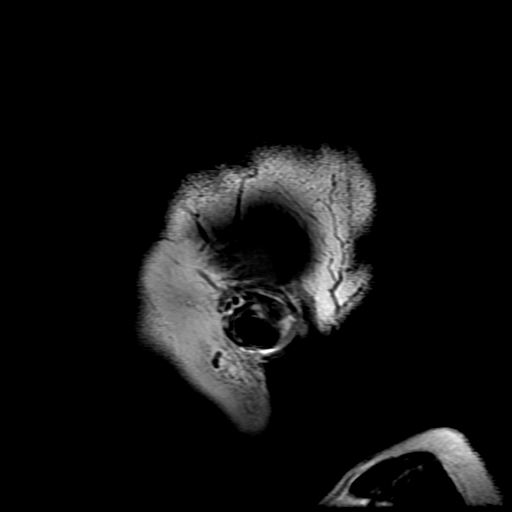
[im 23/23]
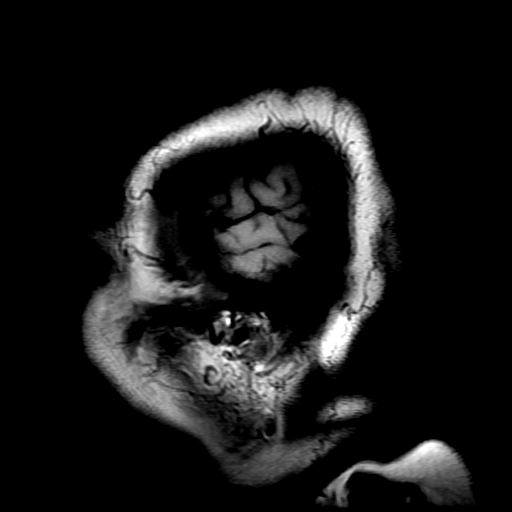

[Series 7: T2 · axial · 5.0mm · 0.43mm/px · z∈[-47,+85]mm · 2 of 23 slices shown (1 of 3)]
[im 1/23]
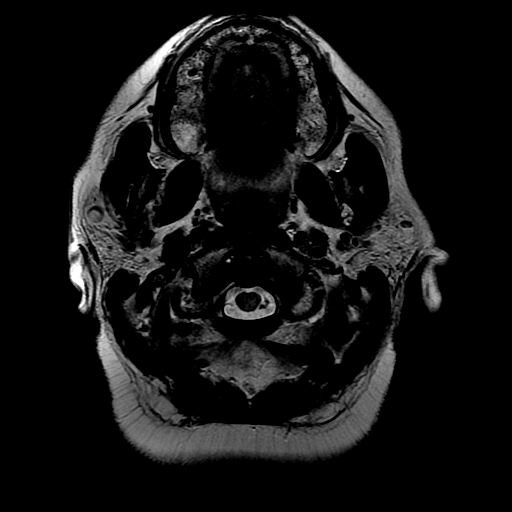
[im 23/23]
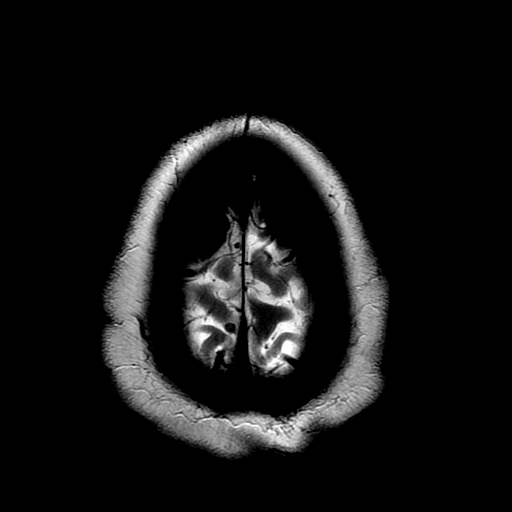

[Series 8: DWI · coronal · 5.0mm · 1.09mm/px · 5 of 62 slices shown (2 of 4)]
[im 1/62]
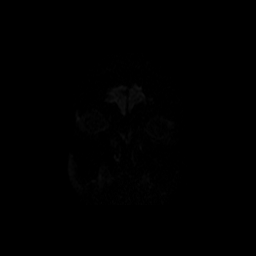
[im 16/62]
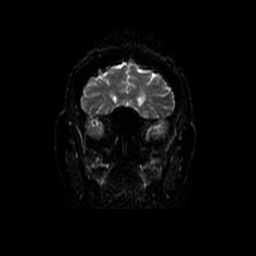
[im 31/62]
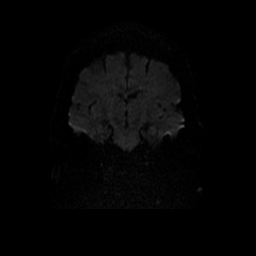
[im 46/62]
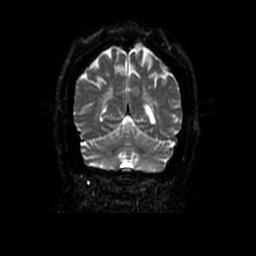
[im 62/62]
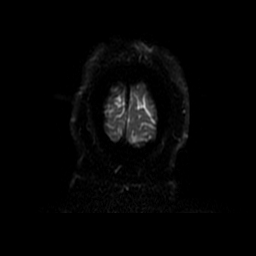

[Series 9: T2 · axial · 5.0mm · 0.43mm/px · z∈[-47,+85]mm · 2 of 23 slices shown (2 of 3)]
[im 1/23]
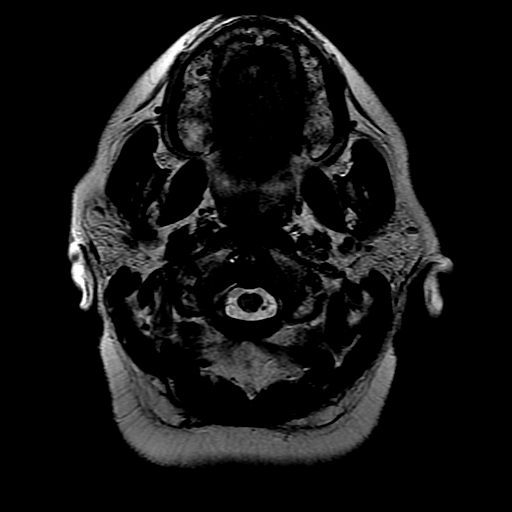
[im 23/23]
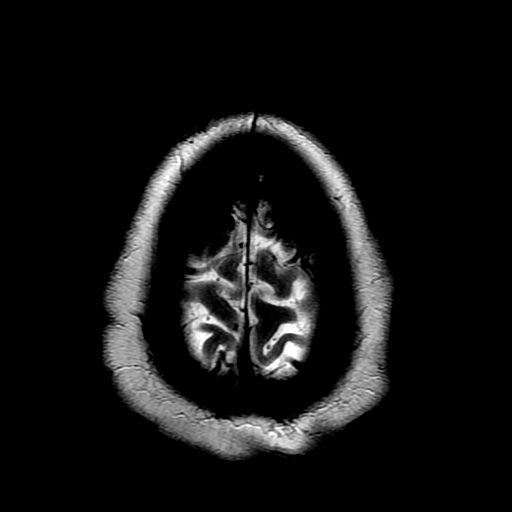

[Series 10: FLAIR · axial · 5.0mm · 0.43mm/px · z∈[-47,+85]mm · 2 of 23 slices shown]
[im 1/23]
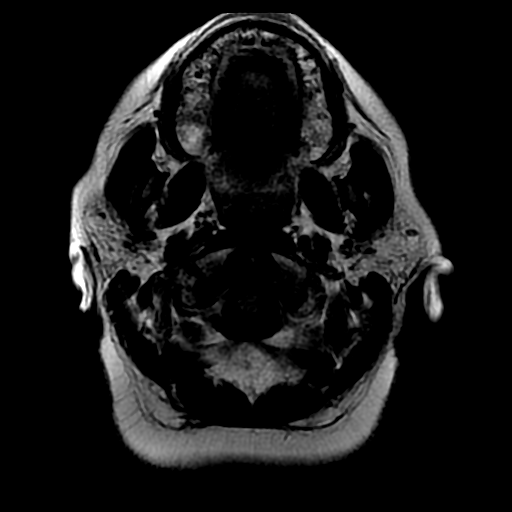
[im 23/23]
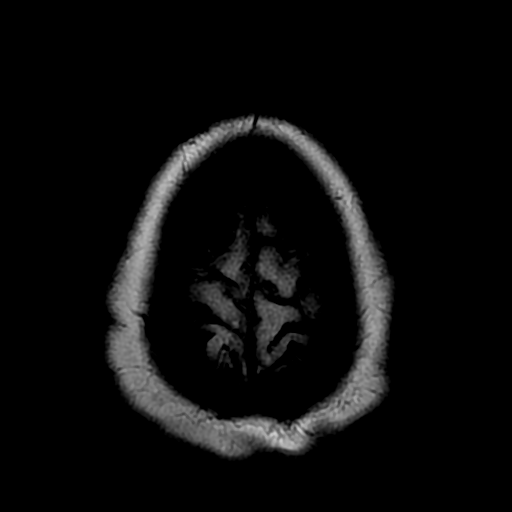

[Series 13: T2 · coronal · 5.0mm · 0.39mm/px · 2 of 25 slices shown (3 of 3)]
[im 1/25]
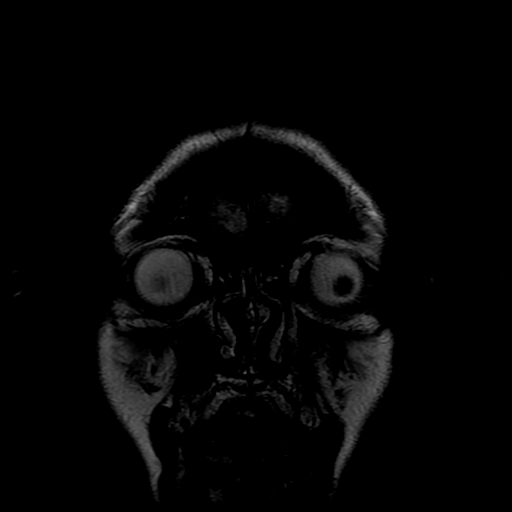
[im 25/25]
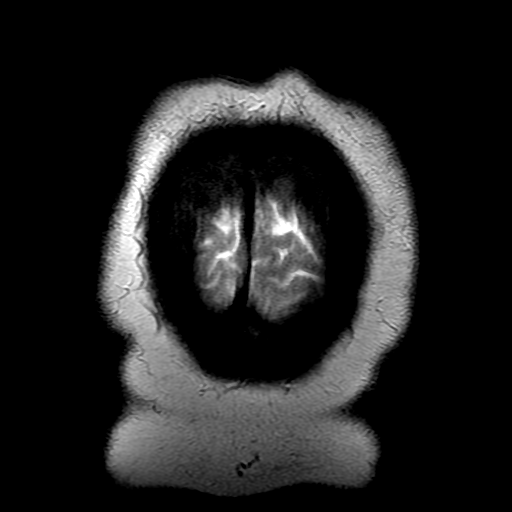

[Series 300: DWI · axial · 3.0mm · 1.09mm/px · z∈[-48,+84]mm · 4 of 45 slices shown (3 of 4)]
[im 1/45]
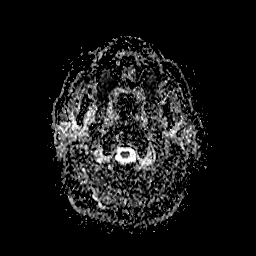
[im 15/45]
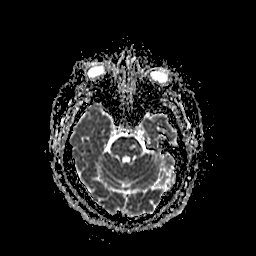
[im 30/45]
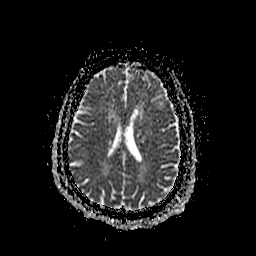
[im 45/45]
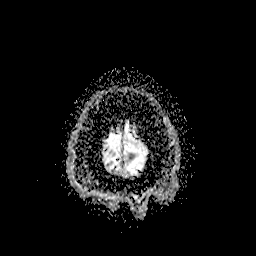

[Series 800: DWI · coronal · 5.0mm · 1.09mm/px · 2 of 31 slices shown (4 of 4)]
[im 1/31]
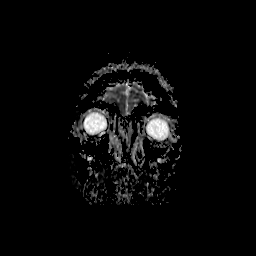
[im 31/31]
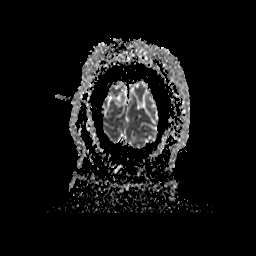

[30 of 48 positions shown; findings below may reference images not displayed]

FINDINGS: MRI HEAD FINDINGS

There is an 8 mm acute infarct in the right thalamus. There is no
evidence of intracranial hemorrhage, mass, midline shift, or
extra-axial fluid collection. Ventricles and sulci are normal.
Patchy T2 hyperintensities in the cerebral white matter are
nonspecific but compatible with mild-to-moderate chronic small
vessel ischemic disease. Small, chronic infarcts are noted in the
left pons, right thalamus, and left basal ganglia.

Orbits are unremarkable. Paranasal sinuses and mastoid air cells are
clear. Major intracranial vascular flow voids are preserved.

MRA HEAD FINDINGS

Visualized distal vertebral arteries are patent with the right being
dominant. There is irregularity and mild narrowing of the distal
left vertebral artery. PICA and SCA origins are patent. Basilar
artery is patent with minimal narrowing in its midportion. PCAs are
unremarkable. There is a patent left posterior communicating artery.

Internal carotid arteries are patent from skullbase to carotid
termini without stenosis. There is a small bulge measuring
approximately 3 mm extending inferomedially from the right internal
carotid artery near the ophthalmic artery origin which may reflect a
tiny aneurysm, although there is mild motion artifact through this
area. M1 and A1 segments are patent without stenosis. There is mild
bilateral A2 and MCA branch vessel irregularity suggestive of
atherosclerosis.
IMPRESSION: 1. Acute right thalamic infarct.
2. Mild to moderate chronic small vessel ischemic disease.
3. Chronic lacunar infarcts in the deep gray nuclei and pons.
4. No major intracranial arterial occlusion. Intracranial
atherosclerosis with mild narrowing of the distal left vertebral
artery and minimal mid basilar narrowing.
5. Possible 3 mm right paraophthalmic aneurysm.

## 2016-03-06 IMAGING — CT CT HEAD W/O CM
2 series · 16 of 30 positions shown, 20 images · non-contrast
Comparison: No comparison head CT.

CLINICAL DATA: [age] year old female with history diabetes
and high blood pressure presenting with headache and left-sided
weakness. Initial encounter.

EXAM:
CT HEAD WITHOUT CONTRAST
TECHNIQUE: Contiguous axial images were obtained from the base of the skull
through the vertex without intravenous contrast.

[Series 203: head w/o bone, idose (1) · axial · non-contrast · 0.49mm/px · z∈[+85,+130]mm · 3 of 33 slices shown]
[im 3/33  bone]
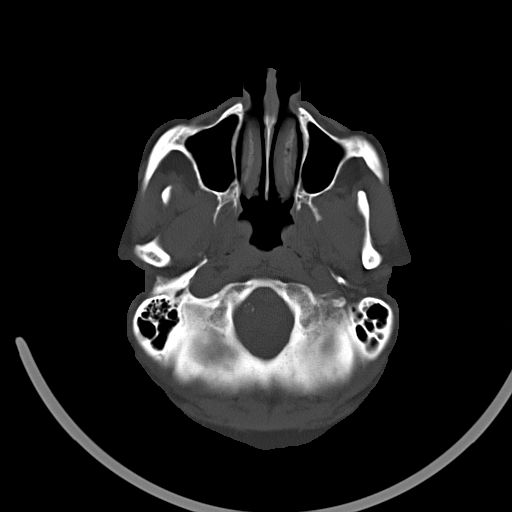
[im 7/33  bone]
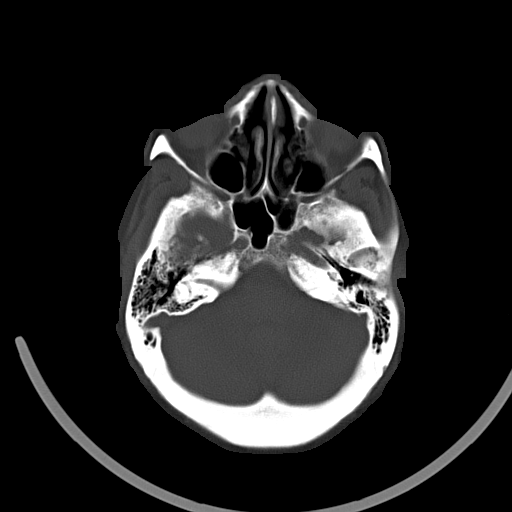
[im 12/33  bone]
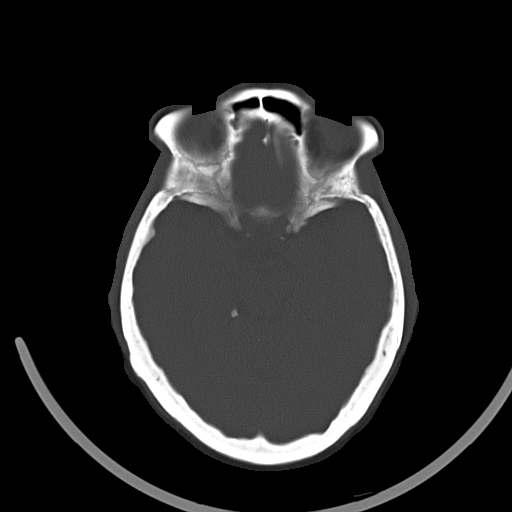

[Series 204: head w/o, idose (1) · axial · non-contrast · 0.49mm/px · z∈[+85,+220]mm · 13 of 33 slices shown, 17 images]
[im 3/33  brain]
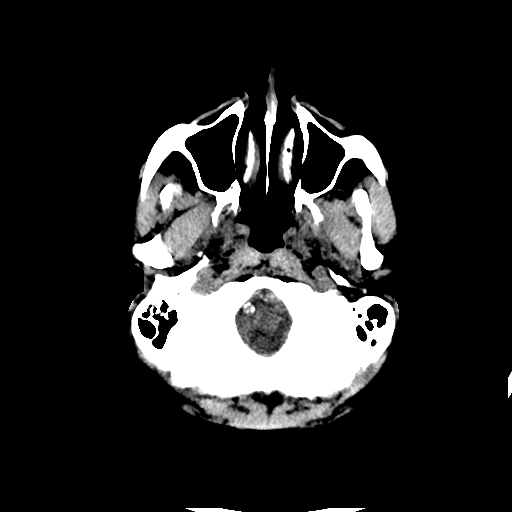
[im 3/33  bone]
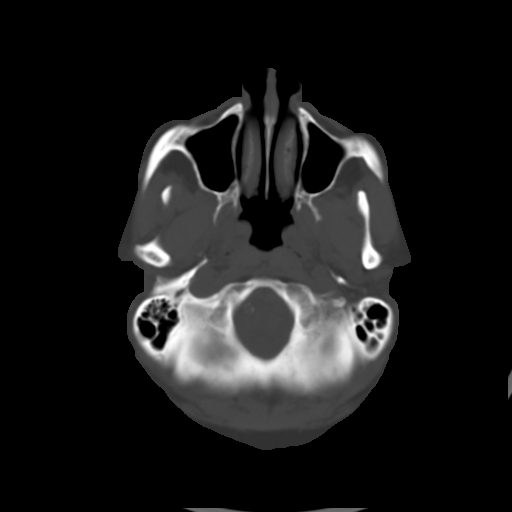
[im 5/33  brain]
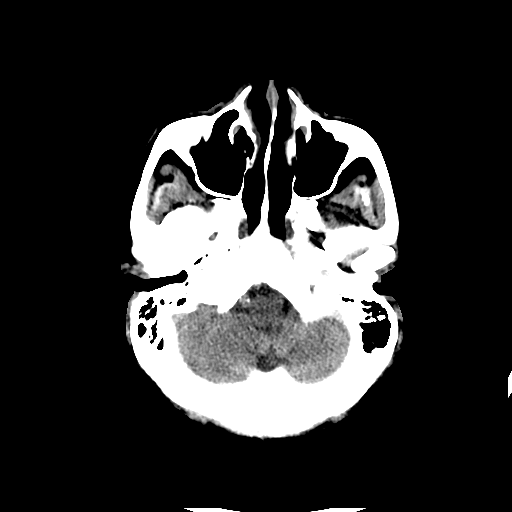
[im 7/33  brain]
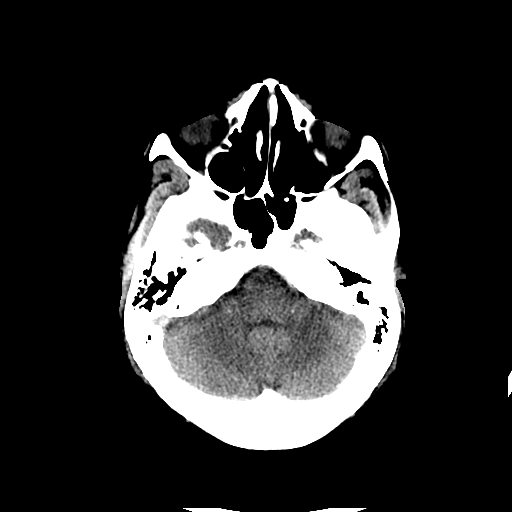
[im 10/33  brain]
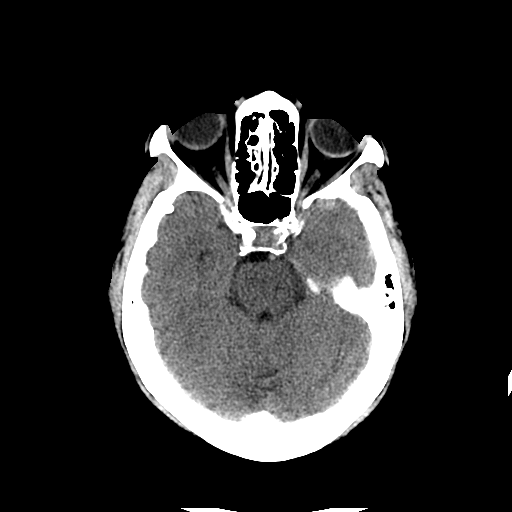
[im 12/33  brain]
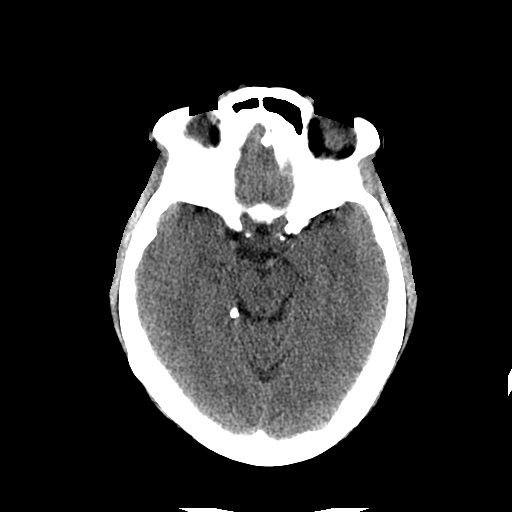
[im 12/33  bone]
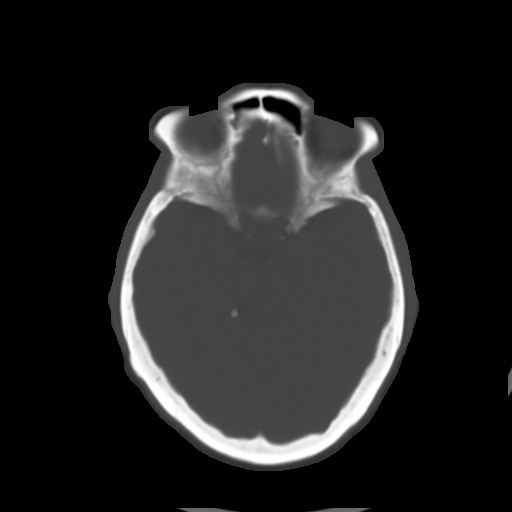
[im 14/33  brain]
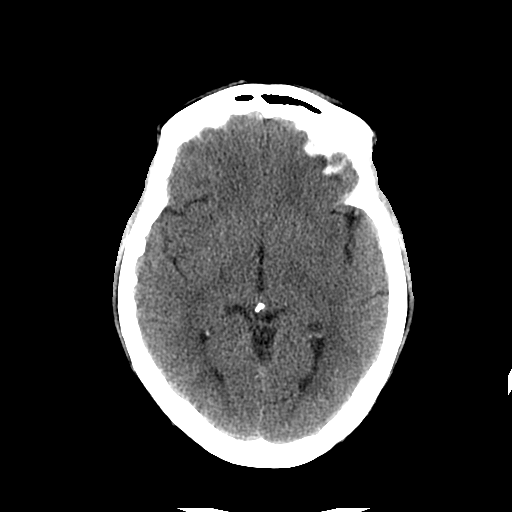
[im 17/33  brain]
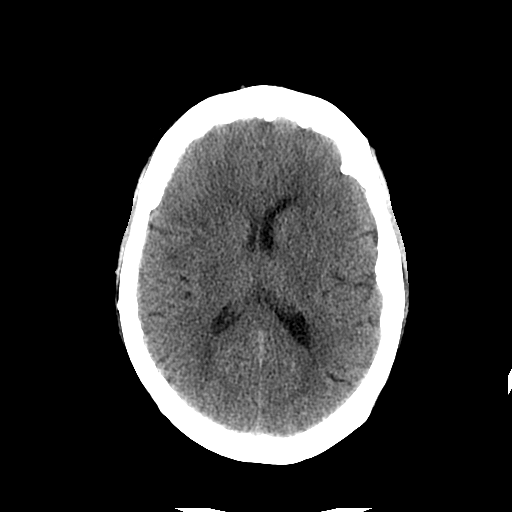
[im 19/33  brain]
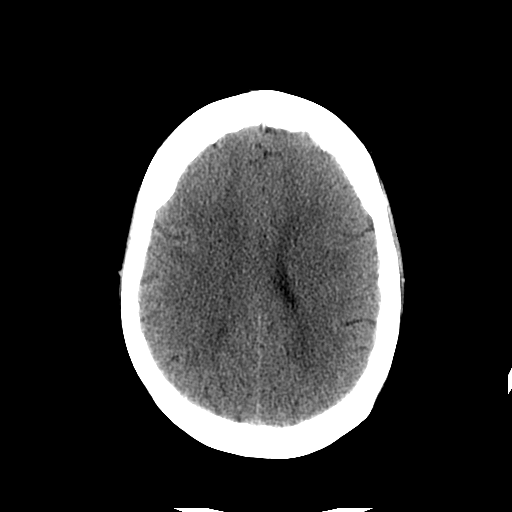
[im 21/33  brain]
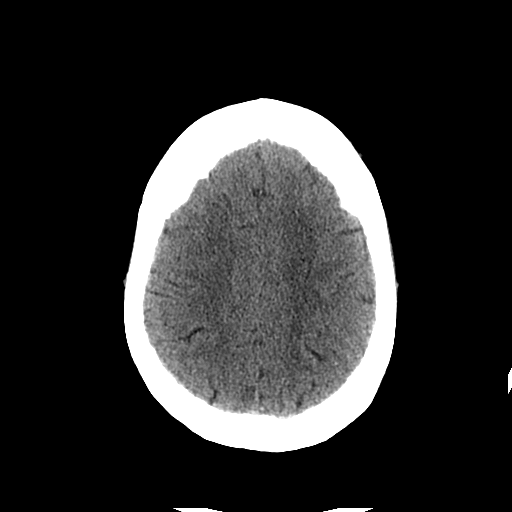
[im 21/33  bone]
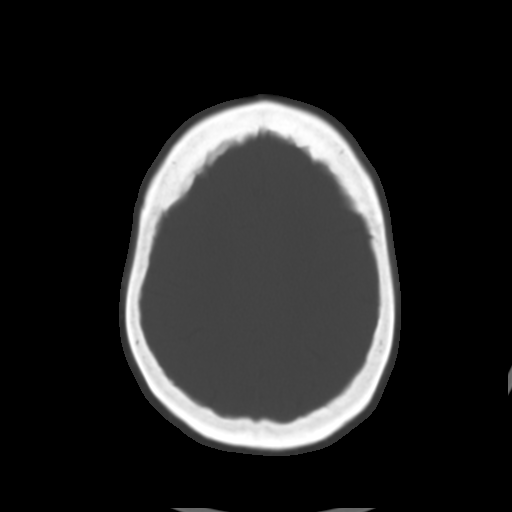
[im 23/33  brain]
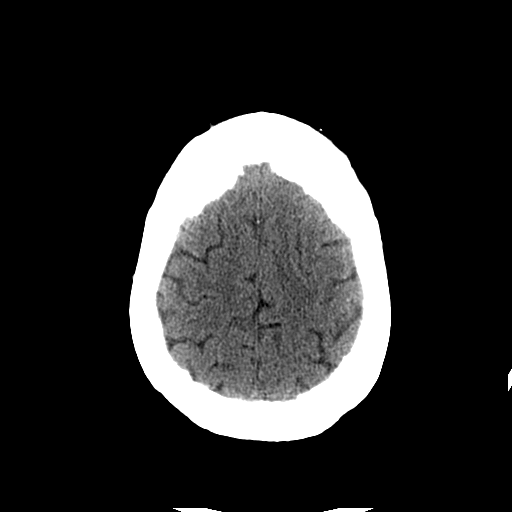
[im 26/33  brain]
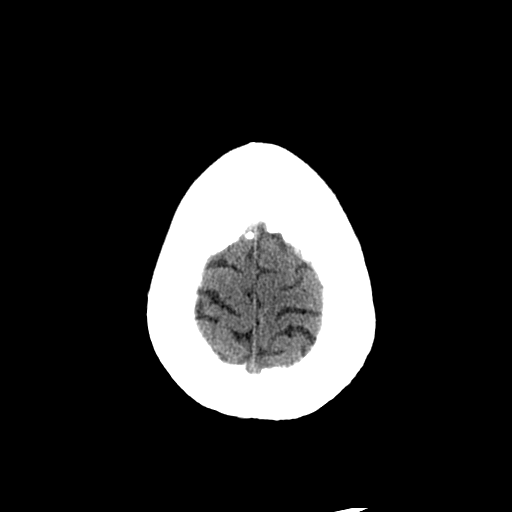
[im 28/33  brain]
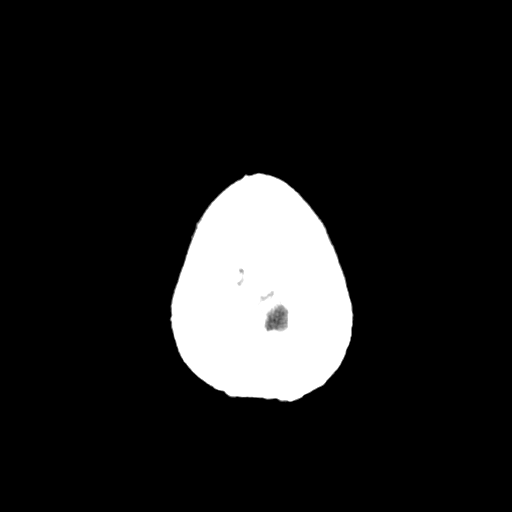
[im 30/33  brain]
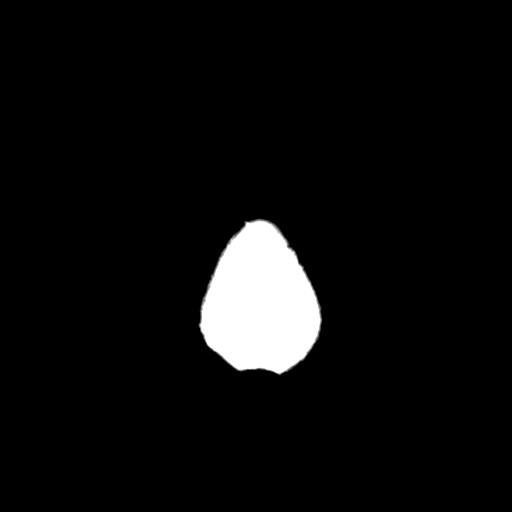
[im 30/33  bone]
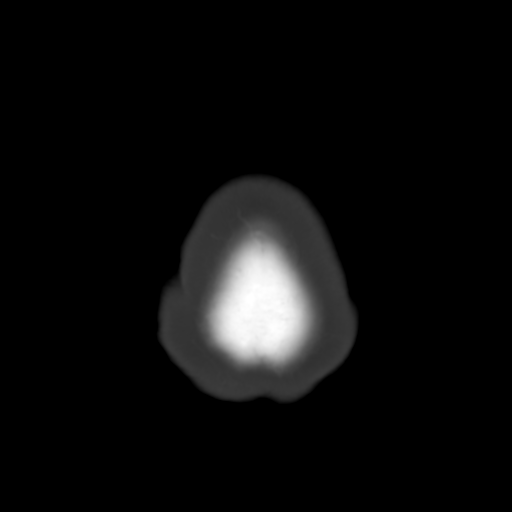

[16 of 30 positions shown; findings below may reference images not displayed]

FINDINGS: No intracranial hemorrhage.

Small vessel disease type changes without CT evidence of large acute
infarct.

No hydrocephalus.

No intracranial mass lesion noted on this unenhanced exam.

Vascular calcifications.

Mastoid air cells, middle ear cavities and visualized paranasal
sinuses are clear.

Orbital structures unremarkable.
IMPRESSION: No intracranial hemorrhage or CT evidence of large acute infarct.

Small vessel disease type changes.

Please see above.

## 2016-04-24 ENCOUNTER — Ambulatory Visit
Admission: RE | Admit: 2016-04-24 | Discharge: 2016-04-24 | Disposition: A | Discharge status: Home / Self Care | Payer: Medicare Other | Source: Ambulatory Visit | Attending: Urology | Admitting: Urology

## 2016-04-24 DIAGNOSIS — R1900 Intra-abdominal and pelvic swelling, mass and lump, unspecified site: Secondary | ICD-10-CM

## 2016-04-24 MED ORDER — GADOBENATE DIMEGLUMINE 529 MG/ML IV SOLN
20.0000 mL | Freq: Once | INTRAVENOUS | Status: AC | PRN
  Administered 2016-04-24: 20 mL via INTRAVENOUS

## 2016-05-06 ENCOUNTER — Encounter (HOSPITAL_COMMUNITY): Payer: Self-pay | Admitting: *Deleted

## 2016-05-06 ENCOUNTER — Other Ambulatory Visit: Payer: Self-pay | Admitting: Gastroenterology

## 2016-05-07 ENCOUNTER — Encounter (HOSPITAL_COMMUNITY)
Admission: RE | Disposition: A | Discharge status: Home / Self Care | Payer: Self-pay | Source: Ambulatory Visit | Attending: Gastroenterology

## 2016-05-07 ENCOUNTER — Encounter (HOSPITAL_COMMUNITY): Payer: Self-pay | Admitting: Registered Nurse

## 2016-05-07 ENCOUNTER — Ambulatory Visit (HOSPITAL_COMMUNITY): Payer: Medicare Other | Admitting: Registered Nurse

## 2016-05-07 ENCOUNTER — Ambulatory Visit (HOSPITAL_COMMUNITY)
Admission: RE | Admit: 2016-05-07 | Discharge: 2016-05-07 | Disposition: A | Discharge status: Home / Self Care | Payer: Medicare Other | Source: Ambulatory Visit | Attending: Gastroenterology | Admitting: Gastroenterology

## 2016-05-07 ENCOUNTER — Ambulatory Visit (HOSPITAL_COMMUNITY): Payer: Medicare Other

## 2016-05-07 DIAGNOSIS — E785 Hyperlipidemia, unspecified: Secondary | ICD-10-CM | POA: Diagnosis not present

## 2016-05-07 DIAGNOSIS — Z8673 Personal history of transient ischemic attack (TIA), and cerebral infarction without residual deficits: Secondary | ICD-10-CM | POA: Diagnosis not present

## 2016-05-07 DIAGNOSIS — I351 Nonrheumatic aortic (valve) insufficiency: Secondary | ICD-10-CM | POA: Diagnosis not present

## 2016-05-07 DIAGNOSIS — Z79899 Other long term (current) drug therapy: Secondary | ICD-10-CM | POA: Diagnosis not present

## 2016-05-07 DIAGNOSIS — K8689 Other specified diseases of pancreas: Secondary | ICD-10-CM | POA: Insufficient documentation

## 2016-05-07 DIAGNOSIS — Z7902 Long term (current) use of antithrombotics/antiplatelets: Secondary | ICD-10-CM | POA: Insufficient documentation

## 2016-05-07 DIAGNOSIS — Z6837 Body mass index (BMI) 37.0-37.9, adult: Secondary | ICD-10-CM | POA: Insufficient documentation

## 2016-05-07 DIAGNOSIS — M1990 Unspecified osteoarthritis, unspecified site: Secondary | ICD-10-CM | POA: Diagnosis not present

## 2016-05-07 DIAGNOSIS — I10 Essential (primary) hypertension: Secondary | ICD-10-CM | POA: Insufficient documentation

## 2016-05-07 DIAGNOSIS — R945 Abnormal results of liver function studies: Secondary | ICD-10-CM

## 2016-05-07 DIAGNOSIS — R932 Abnormal findings on diagnostic imaging of liver and biliary tract: Secondary | ICD-10-CM | POA: Diagnosis present

## 2016-05-07 DIAGNOSIS — Z794 Long term (current) use of insulin: Secondary | ICD-10-CM | POA: Insufficient documentation

## 2016-05-07 DIAGNOSIS — K828 Other specified diseases of gallbladder: Secondary | ICD-10-CM | POA: Insufficient documentation

## 2016-05-07 DIAGNOSIS — E039 Hypothyroidism, unspecified: Secondary | ICD-10-CM | POA: Diagnosis not present

## 2016-05-07 DIAGNOSIS — R7989 Other specified abnormal findings of blood chemistry: Secondary | ICD-10-CM

## 2016-05-07 DIAGNOSIS — E119 Type 2 diabetes mellitus without complications: Secondary | ICD-10-CM | POA: Diagnosis not present

## 2016-05-07 HISTORY — PX: ERCP: SHX5425

## 2016-05-07 LAB — GLUCOSE, CAPILLARY
Glucose-Capillary: 343 mg/dL — ABNORMAL HIGH (ref 65–99)
Glucose-Capillary: 398 mg/dL — ABNORMAL HIGH (ref 65–99)

## 2016-05-07 SURGERY — ERCP, WITH INTERVENTION IF INDICATED
Anesthesia: General

## 2016-05-07 MED ORDER — CEFOTETAN DISODIUM 2 G IJ SOLR
2.0000 g | Freq: Once | INTRAMUSCULAR | Status: AC
  Administered 2016-05-07: 2 g via INTRAVENOUS
  Filled 2016-05-07: qty 2

## 2016-05-07 MED ORDER — LACTATED RINGERS IV SOLN: INTRAVENOUS | Status: DC

## 2016-05-07 MED ORDER — INSULIN ASPART 100 UNIT/ML ~~LOC~~ SOLN
8.0000 [IU] | Freq: Once | SUBCUTANEOUS | Status: AC
  Administered 2016-05-07: 8 [IU] via INTRAVENOUS
  Filled 2016-05-07: qty 0.08

## 2016-05-07 MED ORDER — INDOMETHACIN 50 MG RE SUPP
100.0000 mg | Freq: Once | RECTAL | Status: AC
  Administered 2016-05-07: 100 mg via RECTAL

## 2016-05-07 MED ORDER — LIDOCAINE 2% (20 MG/ML) 5 ML SYRINGE
INTRAMUSCULAR | Status: AC
  Filled 2016-05-07: qty 5

## 2016-05-07 MED ORDER — ROCURONIUM BROMIDE 10 MG/ML (PF) SYRINGE
PREFILLED_SYRINGE | INTRAVENOUS | Status: AC
  Filled 2016-05-07: qty 10

## 2016-05-07 MED ORDER — ONDANSETRON HCL 4 MG/2ML IJ SOLN
INTRAMUSCULAR | Status: DC | PRN
  Administered 2016-05-07: 4 mg via INTRAVENOUS

## 2016-05-07 MED ORDER — EPHEDRINE SULFATE-NACL 50-0.9 MG/10ML-% IV SOSY
PREFILLED_SYRINGE | INTRAVENOUS | Status: DC | PRN
  Administered 2016-05-07: 20 mg via INTRAVENOUS
  Administered 2016-05-07: 15 mg via INTRAVENOUS
  Administered 2016-05-07: 20 mg via INTRAVENOUS
  Administered 2016-05-07: 15 mg via INTRAVENOUS
  Administered 2016-05-07: 10 mg via INTRAVENOUS
  Administered 2016-05-07: 15 mg via INTRAVENOUS

## 2016-05-07 MED ORDER — PHENYLEPHRINE 40 MCG/ML (10ML) SYRINGE FOR IV PUSH (FOR BLOOD PRESSURE SUPPORT)
PREFILLED_SYRINGE | INTRAVENOUS | Status: DC | PRN
  Administered 2016-05-07 (×2): 80 ug via INTRAVENOUS

## 2016-05-07 MED ORDER — SODIUM CHLORIDE 0.9 % IV SOLN
INTRAVENOUS | Status: DC | PRN
  Administered 2016-05-07: 40 mL

## 2016-05-07 MED ORDER — LIDOCAINE 2% (20 MG/ML) 5 ML SYRINGE
INTRAMUSCULAR | Status: DC | PRN
  Administered 2016-05-07: 100 mg via INTRAVENOUS

## 2016-05-07 MED ORDER — LACTATED RINGERS IV SOLN
INTRAVENOUS | Status: DC
  Administered 2016-05-07 (×2): via INTRAVENOUS

## 2016-05-07 MED ORDER — GLUCAGON HCL RDNA (DIAGNOSTIC) 1 MG IJ SOLR
INTRAMUSCULAR | Status: AC
  Filled 2016-05-07: qty 1

## 2016-05-07 MED ORDER — PROPOFOL 10 MG/ML IV BOLUS
INTRAVENOUS | Status: DC | PRN
  Administered 2016-05-07: 160 mg via INTRAVENOUS
  Administered 2016-05-07: 40 mg via INTRAVENOUS

## 2016-05-07 MED ORDER — EPHEDRINE 5 MG/ML INJ
INTRAVENOUS | Status: AC
  Filled 2016-05-07: qty 10

## 2016-05-07 MED ORDER — PHENYLEPHRINE 40 MCG/ML (10ML) SYRINGE FOR IV PUSH (FOR BLOOD PRESSURE SUPPORT)
PREFILLED_SYRINGE | INTRAVENOUS | Status: AC
  Filled 2016-05-07: qty 10

## 2016-05-07 MED ORDER — PROPOFOL 10 MG/ML IV BOLUS
INTRAVENOUS | Status: AC
  Filled 2016-05-07: qty 20

## 2016-05-07 MED ORDER — DEXAMETHASONE SODIUM PHOSPHATE 10 MG/ML IJ SOLN
INTRAMUSCULAR | Status: AC
  Filled 2016-05-07: qty 1

## 2016-05-07 MED ORDER — INDOMETHACIN 50 MG RE SUPP
RECTAL | Status: AC
  Filled 2016-05-07: qty 2

## 2016-05-07 MED ORDER — FENTANYL CITRATE (PF) 100 MCG/2ML IJ SOLN
INTRAMUSCULAR | Status: AC
Start: 2016-05-07 — End: 2016-05-07
  Filled 2016-05-07: qty 2

## 2016-05-07 MED ORDER — CLOPIDOGREL BISULFATE 75 MG PO TABS: 75.0000 mg | ORAL_TABLET | Freq: Every day | ORAL | 1 refills | Status: DC

## 2016-05-07 MED ORDER — ONDANSETRON HCL 4 MG/2ML IJ SOLN
INTRAMUSCULAR | Status: AC
  Filled 2016-05-07: qty 2

## 2016-05-07 MED ORDER — SUCCINYLCHOLINE CHLORIDE 200 MG/10ML IV SOSY
PREFILLED_SYRINGE | INTRAVENOUS | Status: DC | PRN
  Administered 2016-05-07: 100 mg via INTRAVENOUS

## 2016-05-07 MED ORDER — FENTANYL CITRATE (PF) 100 MCG/2ML IJ SOLN
INTRAMUSCULAR | Status: DC | PRN
  Administered 2016-05-07 (×2): 50 ug via INTRAVENOUS

## 2016-05-07 MED ORDER — SODIUM CHLORIDE 0.9 % IV SOLN: INTRAVENOUS | Status: DC

## 2016-05-07 NOTE — Progress Notes (Signed)
Kaylee Mullins 12:29 PM  Subjective: Patient without any new problems since we saw her yesterday and no new questions  Objective: Vital signs stable afebrile no acute distress exam please see preassessment evaluation labs from our office and MRI reviewed  Assessment: CBD obstruction  Plan:  Okay to proceed with ERCP with anesthesia assistance [facility] E  Pager [phone] After 5PM or if no answer call [phone]

## 2016-05-07 NOTE — Discharge Instructions (Signed)
Call if question or problem otherwise may resume Plavix on Sunday and call Monday with update and for biopsy report and probably repeat labs early next week.  Endoscopic Retrograde Cholangiopancreatography (ERCP), Care After Refer to this sheet in the next few weeks. These instructions provide you with information on caring for yourself after your procedure. Your health care provider may also give you more specific instructions. Your treatment has been planned according to current medical practices, but problems sometimes occur. Call your health care provider if you have any problems or questions after your procedure.  WHAT TO EXPECT AFTER THE PROCEDURE  After your procedure, it is typical to feel:   Soreness in your throat.   Sick to your stomach (nauseous).   Bloated.  Dizzy.   Fatigued. HOME CARE INSTRUCTIONS  Have a friend or family member stay with you for the first 24 hours after your procedure.  Start taking your usual medicines and eating normally as soon as you feel well enough to do so or as directed by your health care provider. SEEK MEDICAL CARE IF:  You have abdominal pain.   You develop signs of infection, such as:   Chills.   Feeling unwell.  SEEK IMMEDIATE MEDICAL CARE IF:  You have difficulty swallowing.  You have worsening throat, chest, or abdominal pain.  You vomit.  You have bloody or very black stools.  You have a fever.   This information is not intended to replace advice given to you by your health care provider. Make sure you discuss any questions you have with your health care provider.   Document Released: 05/24/2013 Document Reviewed: 05/24/2013 Elsevier Interactive Patient Education [employer].

## 2016-05-07 NOTE — Anesthesia Preprocedure Evaluation (Addendum)
Anesthesia Evaluation  Patient identified by MRN, date of birth, ID band Patient awake    Reviewed: Allergy & Precautions, NPO status , Patient's Chart, lab work & pertinent test results, reviewed documented beta blocker date and time   Airway Mallampati: II  TM Distance: >3 FB Neck ROM: Full    Dental  (+) Dental Advisory Given   Pulmonary neg pulmonary ROS,    breath sounds clear to auscultation       Cardiovascular hypertension, Pt. on medications and Pt. on home beta blockers  Rhythm:regular Rate:Normal  01/30/16 Echo: Study Conclusions - Left ventricle: The cavity size was normal. There was mild concentric hypertrophy. Systolic function was normal. The estimated ejection fraction was in the range of 55% to 60%. Wall motion was normal; there were no regional wall motion abnormalities. Features are consistent with a pseudonormal left ventricular filling pattern, with concomitant abnormal relaxation  and increased filling pressure (grade 2 diastolic dysfunction). - Aortic valve: There was trivial regurgitation. Mean gradient (S): 7 mm Hg. Peak gradient (S): 12 mm Hg. Valve area (VTI): 1.87 cm^2. Valve area (Vmax): 2.12 cm^2. Valve area (Vmean): 2 cm^2. - Left atrium: The atrium was mildly dilated.   02/02/16 Myoview without ischemia.   Neuro/Psych CVA, Residual Symptoms    GI/Hepatic negative GI ROS, Neg liver ROS,   Endo/Other  diabetes, Type 2, Insulin DependentHypothyroidism Morbid obesity  Renal/GU Renal disease     Musculoskeletal  (+) Arthritis ,   Abdominal   Peds  Hematology negative hematology ROS (+)   Anesthesia Other Findings   Reproductive/Obstetrics                            Lab Results  Component Value Date   WBC 11.0 (H) 02/14/2016   HGB 11.5 (L) 02/14/2016   HCT 35.1 (L) 02/14/2016   MCV 96.7 02/14/2016   PLT 242 02/14/2016   Lab Results  Component Value Date   CREATININE 0.93 02/13/2016   BUN 13 02/13/2016   NA 138 02/13/2016   K 3.5 02/13/2016   CL 100 (L) 02/13/2016   CO2 27 02/13/2016    Anesthesia Physical  Anesthesia Plan  ASA: III  Anesthesia Plan: General   Post-op Pain Management:    Induction: Intravenous  Airway Management Planned: Oral ETT  Additional Equipment:   Intra-op Plan:   Post-operative Plan: Extubation in OR  Informed Consent: I have reviewed the patients History and Physical, chart, labs and discussed the procedure including the risks, benefits and alternatives for the proposed anesthesia with the patient or authorized representative who has indicated his/her understanding and acceptance.   Dental advisory given  Plan Discussed with: CRNA  Anesthesia Plan Comments:         Anesthesia Quick Evaluation

## 2016-05-07 NOTE — Progress Notes (Signed)
Patient's blood sugar 398 after procedure.  Dr. Delma Post requested pt take her night time dose early once pt was more alert.  Pt had Humalog 50/50  kwikpen at bedside.  Spoke with pharmacy about pt taking her own insulin while in recovery.  Pharmacy confirmed that it will need to be approved by doctor caring for pt.  Discussed with Dr. Delma Post to see if it was okay that pt takes her own medication.  Dr. Delma Post stopped by to talk to pt about taking her insulin and agreed to allow pt to give her own insulin before discharge.  Pt and family educated about monitoring glucose at home, eating dinner before bedtime and avoiding her insulin dose tonight.  Pt and family verbalized understanding.  60 units of Humalog 50/50 given.  Will continue to monitor until discharge.

## 2016-05-07 NOTE — Anesthesia Postprocedure Evaluation (Signed)
Anesthesia Post Note  Patient: [name]  Procedure(s) Performed: Procedure(s) (LRB): ENDOSCOPIC RETROGRADE CHOLANGIOPANCREATOGRAPHY (ERCP) (N/A)  Patient location during evaluation: PACU Anesthesia Type: General Level of consciousness: awake and alert Pain management: pain level controlled Vital Signs Assessment: post-procedure vital signs reviewed and stable Respiratory status: spontaneous breathing, nonlabored ventilation, respiratory function stable and patient connected to nasal cannula oxygen Cardiovascular status: blood pressure returned to baseline and stable Postop Assessment: no signs of nausea or vomiting Anesthetic complications: no Comments: Patient will manage her elevated blood glucose. She understands the need for insulin soon and plans to take her usual evening dose soon.  Daughter also understands and consents.    Last Vitals:  Vitals:   05/07/16 1510 05/07/16 1520  BP: (!) 139/50 (!) 152/76  Pulse: 65 64  Resp: 19 20  Temp:      Last Pain:  Vitals:   05/07/16 1420  TempSrc: Oral                 Fahima Cifelli J

## 2016-05-07 NOTE — Op Note (Signed)
[facility] Patient Name: Kaylee Mullins Procedure Date: 05/07/2016 MRN: [id number] Attending MD: Clarene Essex , MD Date of Birth: [birth year] CSN: [id number] Age: 80 Admit Type: Outpatient Procedure:                ERCP Indications:              Abnormal abdominal MRI, Elevated liver enzymes Providers:                Clarene Essex, MD, Laverta Baltimore RN, RN, Elspeth Cho Tech., Technician, Marla Roe, CRNA Referring MD:              Medicines:                General Anesthesia Complications:            No immediate complications. Estimated Blood Loss:     Estimated blood loss: none. Procedure:                Pre-Anesthesia Assessment:                           - Prior to the procedure, a History and Physical                            was performed, and patient medications and                            allergies were reviewed. The patient's tolerance of                            previous anesthesia was also reviewed. The risks                            and benefits of the procedure and the sedation                            options and risks were discussed with the patient.                            All questions were answered, and informed consent                            was obtained. Prior Anticoagulants: The patient has                            taken Plavix (clopidogrel), last dose was 1 day                            prior to procedure. ASA Grade Assessment: III - A                            patient with severe systemic disease. After  reviewing the risks and benefits, the patient was                            deemed in satisfactory condition to undergo the                            procedure.                           After obtaining informed consent, the scope was                            passed under direct vision. Throughout the                            procedure, the patient's blood  pressure, pulse, and                            oxygen saturations were monitored continuously. The                            [id number]) scope was introduced through                            the mouth, and used to inject contrast into and                            used to locate the major papilla. The ERCP was                            somewhat difficult due to challenging cannulation                            because of abnormal anatomy. Successful completion                            of the procedure was aided by performing the                            maneuvers documented (below) in this report. The                            patient tolerated the procedure well. Scope In: Scope Out: Findings:      she did have a abnormal appearing bulbous ampulla, One 5 Fr by 5 cm       plastic stent with a single internal pigtail was placed into the ventral       pancreatic duct after the first two wire advancements went towards the       pancreas and we did inject the pancreas which confirmed a significant       dilation and a probable distal stricture. The stent was in good       position. after a prolonged effort tocannulate next to the stentwe       proceeded with A biliary pre-cut sphincterotomy was made with  a needle       knife using a freehand technique using ERBE electrocautery. There was no       post-sphincterotomy bleeding. the wire was advanced into the       intrahepatics and deep selective cannulation was obtained in the duct       system was markedly dilated and the needle-knife was removed and we       exchanged it for the sphincterotome and proceeded with increasing The       biliary sphincterotomy was extended with a Hydratome sphincterotome       using ERBE electrocautery. There was no post-sphincterotomy bleeding.       The common bile duct was markedly dilated, without an obstruction. but       she probably had one at the ampulla and we proceeded with brushing  for       Cells for cytology were obtained by brushing of the CBD distal duct. To       size the object(s), the biliary tree was swept with a 15 mm balloon       starting at the upper third of the main bile duct.and by decreasing to       12 mm it did pass fairly readily through thepatent sphincterotomy site       with only minimal resistance and Nothing was found. we proceeded with       biopsying the bulbous ampulla at the end of the procedure with a cold       forceps for histology. she seemed to have excellent biliary drainage and       we elected not to place a CBD stent since no obvious obstruction was       seen proximal to the sphincterotomy Impression:               - The common bile duct was markedly dilated, with a                            probable obstruction at the ampulla and the                            pancreatic duct was dilated as well.                           - One plastic stent was placed into the ventral                            pancreatic duct.                           - A biliary sphincterotomy was performedusing the                            needle knife and then extended using the                            sphincterotome as above.                           - A biliary sphincterotomy was performed.                           -  The biliary tree was swept and nothing was found.                            brushings of the ampulla and distal duct as well as                            biopsy of the ampulla was obtained Moderate Sedation:      N/A- Per Anesthesia Care Recommendation:           - Avoid aspirin and nonsteroidal anti-inflammatory                            medicines indefinitely.                           - Resume Plavix (clopidogrel) in 3 days at prior                            dose.                           - Telephone GI clinic if symptomatic PRN.                           - Telephone GI clinic for pathology results in 4                             days.                           - Return to GI clinic at appointment to be                            scheduled. Procedure Code(s):        --- Professional ---                           [id number], Esophagogastroduodenoscopy, flexible,                            transoral; diagnostic, including collection of                            specimen(s) by brushing or washing, when performed                            (separate procedure) Diagnosis Code(s):        --- Professional ---                           K83.1, Obstruction of bile duct                           R74.8, Abnormal levels of other serum enzymes                           R93.5, Abnormal findings  on diagnostic imaging of                            other abdominal regions, including retroperitoneum CPT copyright 2016 [employer]. All rights reserved. The codes documented in this report are preliminary and upon coder review may  be revised to meet current compliance requirements. Clarene Essex, MD 05/07/2016 2:09:03 PM This report has been signed electronically. Number of Addenda: 0

## 2016-05-07 NOTE — Anesthesia Procedure Notes (Signed)
Procedure Name: Intubation Date/Time: 05/07/2016 12:39 PM Performed by: Talbot Grumbling Pre-anesthesia Checklist: Patient identified, Emergency Drugs available, Suction available and Patient being monitored Patient Re-evaluated:Patient Re-evaluated prior to inductionOxygen Delivery Method: Circle system utilized Preoxygenation: Pre-oxygenation with 100% oxygen Intubation Type: IV induction Ventilation: Mask ventilation without difficulty Laryngoscope Size: Miller and 2 Grade View: Grade I Tube type: Oral Tube size: 7.5 mm Number of attempts: 1 Airway Equipment and Method: Stylet Placement Confirmation: ETT inserted through vocal cords under direct vision,  positive ETCO2 and breath sounds checked- equal and bilateral Secured at: 21 cm Tube secured with: Tape Dental Injury: Teeth and Oropharynx as per pre-operative assessment

## 2016-05-07 NOTE — Transfer of Care (Signed)
Immediate Anesthesia Transfer of Care Note  Patient: Kaylee Mullins  Procedure(s) Performed: Procedure(s): ENDOSCOPIC RETROGRADE CHOLANGIOPANCREATOGRAPHY (ERCP) (N/A)  Patient Location: PACU  Anesthesia Type:General  Level of Consciousness:  sedated, patient cooperative and responds to stimulation  Airway & Oxygen Therapy:Patient Spontanous Breathing and Patient connected to face mask oxgen  Post-op Assessment:  Report given to PACU RN and Post -op Vital signs reviewed and stable  Post vital signs:  Reviewed and stable  Last Vitals:  Vitals:   05/07/16 1118  BP: (!) 164/50  Pulse: (!) 51  Resp: 18  Temp: [id number] C    Complications: No apparent anesthesia complications

## 2016-05-08 ENCOUNTER — Encounter (HOSPITAL_COMMUNITY): Payer: Self-pay | Admitting: Gastroenterology

## 2016-05-14 ENCOUNTER — Encounter (HOSPITAL_COMMUNITY): Payer: Self-pay | Admitting: *Deleted

## 2016-05-14 ENCOUNTER — Emergency Department (HOSPITAL_COMMUNITY)
Admission: EM | Admit: 2016-05-14 | Discharge: 2016-05-14 | Disposition: A | Discharge status: Home / Self Care | Payer: Medicare Other | Attending: Emergency Medicine | Admitting: Emergency Medicine

## 2016-05-14 DIAGNOSIS — E039 Hypothyroidism, unspecified: Secondary | ICD-10-CM | POA: Insufficient documentation

## 2016-05-14 DIAGNOSIS — Z85850 Personal history of malignant neoplasm of thyroid: Secondary | ICD-10-CM | POA: Diagnosis not present

## 2016-05-14 DIAGNOSIS — Z8673 Personal history of transient ischemic attack (TIA), and cerebral infarction without residual deficits: Secondary | ICD-10-CM | POA: Insufficient documentation

## 2016-05-14 DIAGNOSIS — E1022 Type 1 diabetes mellitus with diabetic chronic kidney disease: Secondary | ICD-10-CM | POA: Insufficient documentation

## 2016-05-14 DIAGNOSIS — N189 Chronic kidney disease, unspecified: Secondary | ICD-10-CM | POA: Insufficient documentation

## 2016-05-14 DIAGNOSIS — K921 Melena: Secondary | ICD-10-CM | POA: Diagnosis present

## 2016-05-14 DIAGNOSIS — E860 Dehydration: Secondary | ICD-10-CM | POA: Diagnosis not present

## 2016-05-14 DIAGNOSIS — R531 Weakness: Secondary | ICD-10-CM

## 2016-05-14 LAB — HEPATIC FUNCTION PANEL
ALT: 87 U/L — ABNORMAL HIGH (ref 14–54)
AST: 89 U/L — ABNORMAL HIGH (ref 15–41)
Albumin: 3 g/dL — ABNORMAL LOW (ref 3.5–5.0)
Alkaline Phosphatase: 329 U/L — ABNORMAL HIGH (ref 38–126)
Bilirubin, Direct: 1.8 mg/dL — ABNORMAL HIGH (ref 0.1–0.5)
Indirect Bilirubin: 2.2 mg/dL — ABNORMAL HIGH (ref 0.3–0.9)
Total Bilirubin: 4 mg/dL — ABNORMAL HIGH (ref 0.3–1.2)
Total Protein: 7.7 g/dL (ref 6.5–8.1)

## 2016-05-14 LAB — BASIC METABOLIC PANEL
Anion gap: 12 (ref 5–15)
BUN: 29 mg/dL — ABNORMAL HIGH (ref 6–20)
CO2: 23 mmol/L (ref 22–32)
Calcium: 8 mg/dL — ABNORMAL LOW (ref 8.9–10.3)
Chloride: 96 mmol/L — ABNORMAL LOW (ref 101–111)
Creatinine, Ser: 1.18 mg/dL — ABNORMAL HIGH (ref 0.44–1.00)
GFR calc Af Amer: 50 mL/min — ABNORMAL LOW (ref >60)
GFR calc non Af Amer: 43 mL/min — ABNORMAL LOW (ref >60)
Glucose, Bld: 417 mg/dL — ABNORMAL HIGH (ref 65–99)
Potassium: 3.4 mmol/L — ABNORMAL LOW (ref 3.5–5.1)
Sodium: 131 mmol/L — ABNORMAL LOW (ref 135–145)

## 2016-05-14 LAB — URINALYSIS, ROUTINE W REFLEX MICROSCOPIC
Glucose, UA: >1000 mg/dL — AB
Ketones, ur: NEGATIVE mg/dL
Nitrite: NEGATIVE
Protein, ur: 30 mg/dL — AB
Specific Gravity, Urine: 1.022 (ref 1.005–1.030)
pH: 5 (ref 5.0–8.0)

## 2016-05-14 LAB — CBC WITH DIFFERENTIAL/PLATELET
Basophils Absolute: 0 10*3/uL (ref 0.0–0.1)
Basophils Relative: 0 %
Eosinophils Absolute: 0 10*3/uL (ref 0.0–0.7)
Eosinophils Relative: 1 %
HCT: 36.3 % (ref 36.0–46.0)
Hemoglobin: 12.5 g/dL (ref 12.0–15.0)
Lymphocytes Relative: 13 %
Lymphs Abs: 1 10*3/uL (ref 0.7–4.0)
MCH: 32.1 pg (ref 26.0–34.0)
MCHC: 34.4 g/dL (ref 30.0–36.0)
MCV: 93.1 fL (ref 78.0–100.0)
Monocytes Absolute: 0.5 10*3/uL (ref 0.1–1.0)
Monocytes Relative: 7 %
Neutro Abs: 6.2 10*3/uL (ref 1.7–7.7)
Neutrophils Relative %: 79 %
Platelets: 310 10*3/uL (ref 150–400)
RBC: 3.9 MIL/uL (ref 3.87–5.11)
RDW: 17.5 % — ABNORMAL HIGH (ref 11.5–15.5)
WBC: 7.8 10*3/uL (ref 4.0–10.5)

## 2016-05-14 LAB — POC OCCULT BLOOD, ED: Fecal Occult Bld: NEGATIVE

## 2016-05-14 LAB — URINE MICROSCOPIC-ADD ON

## 2016-05-14 LAB — CBG MONITORING, ED: Glucose-Capillary: 325 mg/dL — ABNORMAL HIGH (ref 65–99)

## 2016-05-14 LAB — LIPASE, BLOOD: Lipase: 18 U/L (ref 11–51)

## 2016-05-14 MED ORDER — SODIUM CHLORIDE 0.9 % IV BOLUS (SEPSIS)
2000.0000 mL | Freq: Once | INTRAVENOUS | Status: AC
Start: 2016-05-14 — End: 2016-05-14
  Administered 2016-05-14: 2000 mL via INTRAVENOUS

## 2016-05-14 MED ORDER — INSULIN ASPART 100 UNIT/ML ~~LOC~~ SOLN
12.0000 [IU] | Freq: Once | SUBCUTANEOUS | Status: AC
  Administered 2016-05-14: 12 [IU] via INTRAVENOUS
  Filled 2016-05-14: qty 1

## 2016-05-14 MED ORDER — POTASSIUM CHLORIDE CRYS ER 20 MEQ PO TBCR
40.0000 meq | EXTENDED_RELEASE_TABLET | Freq: Once | ORAL | Status: AC
  Administered 2016-05-14: 40 meq via ORAL
  Filled 2016-05-14: qty 2

## 2016-05-14 MED ORDER — INSULIN ASPART 100 UNIT/ML ~~LOC~~ SOLN
10.0000 [IU] | Freq: Once | SUBCUTANEOUS | Status: AC
  Administered 2016-05-14: 10 [IU] via INTRAVENOUS
  Filled 2016-05-14: qty 1

## 2016-05-14 NOTE — Progress Notes (Signed)
05-14-16 Spoke with pt, she is headed to ERD due to "feeling bad and passing dark tarry stools" was told by MD to go to ER for these symptoms". Instructed would check in with her 05-15-16 PM, if she was not admitted today.

## 2016-05-14 NOTE — ED Triage Notes (Signed)
Patient had an endoscopy on Tuesday and has been passing dark, tarry stools for 2 days.  Patient denies emesis, but endorses loose stools.  Patient states she also feels tired.  Patient denies pain.

## 2016-05-14 NOTE — ED Notes (Signed)
Pt tried for UA and missed the collection hat.   Will try again

## 2016-05-14 NOTE — ED Notes (Signed)
Bed: WA19 Expected date:  Expected time:  Means of arrival:  Comments: triage 

## 2016-05-15 ENCOUNTER — Encounter (HOSPITAL_COMMUNITY): Payer: Self-pay | Admitting: *Deleted

## 2016-05-15 ENCOUNTER — Other Ambulatory Visit: Payer: Self-pay | Admitting: Gastroenterology

## 2016-05-18 ENCOUNTER — Encounter (HOSPITAL_COMMUNITY)
Admission: RE | Disposition: A | Discharge status: Home / Self Care | Payer: Self-pay | Source: Ambulatory Visit | Attending: Gastroenterology

## 2016-05-18 ENCOUNTER — Ambulatory Visit (HOSPITAL_COMMUNITY)
Admission: RE | Admit: 2016-05-18 | Discharge: 2016-05-18 | Disposition: A | Discharge status: Home / Self Care | Payer: Medicare Other | Source: Ambulatory Visit | Attending: Gastroenterology | Admitting: Gastroenterology

## 2016-05-18 ENCOUNTER — Ambulatory Visit (HOSPITAL_COMMUNITY): Payer: Medicare Other | Admitting: Anesthesiology

## 2016-05-18 ENCOUNTER — Encounter (HOSPITAL_COMMUNITY): Payer: Self-pay | Admitting: *Deleted

## 2016-05-18 DIAGNOSIS — E119 Type 2 diabetes mellitus without complications: Secondary | ICD-10-CM | POA: Diagnosis not present

## 2016-05-18 DIAGNOSIS — Z5309 Procedure and treatment not carried out because of other contraindication: Secondary | ICD-10-CM | POA: Insufficient documentation

## 2016-05-18 DIAGNOSIS — R932 Abnormal findings on diagnostic imaging of liver and biliary tract: Secondary | ICD-10-CM | POA: Insufficient documentation

## 2016-05-18 DIAGNOSIS — Z79899 Other long term (current) drug therapy: Secondary | ICD-10-CM | POA: Insufficient documentation

## 2016-05-18 DIAGNOSIS — Z8673 Personal history of transient ischemic attack (TIA), and cerebral infarction without residual deficits: Secondary | ICD-10-CM | POA: Diagnosis not present

## 2016-05-18 DIAGNOSIS — Z85850 Personal history of malignant neoplasm of thyroid: Secondary | ICD-10-CM | POA: Diagnosis not present

## 2016-05-18 DIAGNOSIS — R740 Nonspecific elevation of levels of transaminase and lactic acid dehydrogenase [LDH]: Secondary | ICD-10-CM | POA: Insufficient documentation

## 2016-05-18 DIAGNOSIS — E785 Hyperlipidemia, unspecified: Secondary | ICD-10-CM | POA: Insufficient documentation

## 2016-05-18 DIAGNOSIS — Z794 Long term (current) use of insulin: Secondary | ICD-10-CM | POA: Insufficient documentation

## 2016-05-18 DIAGNOSIS — R634 Abnormal weight loss: Secondary | ICD-10-CM | POA: Insufficient documentation

## 2016-05-18 DIAGNOSIS — Z7902 Long term (current) use of antithrombotics/antiplatelets: Secondary | ICD-10-CM | POA: Diagnosis not present

## 2016-05-18 DIAGNOSIS — I10 Essential (primary) hypertension: Secondary | ICD-10-CM | POA: Insufficient documentation

## 2016-05-18 HISTORY — DX: Melena: K92.1

## 2016-05-18 LAB — HEPATIC FUNCTION PANEL
ALT: 106 U/L — ABNORMAL HIGH (ref 14–54)
AST: 155 U/L — ABNORMAL HIGH (ref 15–41)
Albumin: 2.7 g/dL — ABNORMAL LOW (ref 3.5–5.0)
Alkaline Phosphatase: 545 U/L — ABNORMAL HIGH (ref 38–126)
Bilirubin, Direct: 1.1 mg/dL — ABNORMAL HIGH (ref 0.1–0.5)
Indirect Bilirubin: 1.5 mg/dL — ABNORMAL HIGH (ref 0.3–0.9)
Total Bilirubin: 2.6 mg/dL — ABNORMAL HIGH (ref 0.3–1.2)
Total Protein: 7.2 g/dL (ref 6.5–8.1)

## 2016-05-18 LAB — GLUCOSE, CAPILLARY: Glucose-Capillary: 402 mg/dL — ABNORMAL HIGH (ref 65–99)

## 2016-05-18 SURGERY — CANCELLED PROCEDURE
Anesthesia: Monitor Anesthesia Care

## 2016-05-18 MED ORDER — SODIUM CHLORIDE 0.9 % IV SOLN: INTRAVENOUS | Status: DC

## 2016-05-18 MED ORDER — LIDOCAINE 2% (20 MG/ML) 5 ML SYRINGE
INTRAMUSCULAR | Status: AC
  Filled 2016-05-18: qty 5

## 2016-05-18 MED ORDER — LACTATED RINGERS IV SOLN: INTRAVENOUS | Status: DC

## 2016-05-18 MED ORDER — PROPOFOL 10 MG/ML IV BOLUS
INTRAVENOUS | Status: AC
  Filled 2016-05-18: qty 20

## 2016-05-18 NOTE — Anesthesia Preprocedure Evaluation (Deleted)
Anesthesia Evaluation  Patient identified by MRN, date of birth, ID band Patient awake    Reviewed: Allergy & Precautions, NPO status , Patient's Chart, lab work & pertinent test results  Airway Mallampati: II  TM Distance: >3 FB Neck ROM: Full    Dental no notable dental hx.    Pulmonary neg pulmonary ROS,    Pulmonary exam normal breath sounds clear to auscultation       Cardiovascular hypertension, Pt. on home beta blockers and Pt. on medications negative cardio ROS Normal cardiovascular exam Rhythm:Regular Rate:Normal  HR about 40/minute. She did take her metoprolol today.   Neuro/Psych CVA negative neurological ROS  negative psych ROS   GI/Hepatic negative GI ROS, Neg liver ROS,   Endo/Other  negative endocrine ROSdiabetesHypothyroidism   Renal/GU Renal diseasenegative Renal ROS  negative genitourinary   Musculoskeletal negative musculoskeletal ROS (+) Arthritis ,   Abdominal (+) + obese,   Peds negative pediatric ROS (+)  Hematology negative hematology ROS (+)   Anesthesia Other Findings   Reproductive/Obstetrics negative OB ROS                           Anesthesia Physical Anesthesia Plan  ASA: III  Anesthesia Plan: MAC   Post-op Pain Management:    Induction: Intravenous  Airway Management Planned: Natural Airway  Additional Equipment:   Intra-op Plan:   Post-operative Plan:   Informed Consent: I have reviewed the patients History and Physical, chart, labs and discussed the procedure including the risks, benefits and alternatives for the proposed anesthesia with the patient or authorized representative who has indicated his/her understanding and acceptance.   Dental advisory given  Plan Discussed with: CRNA  Anesthesia Plan Comments: (MAC with GA backup.  1315: Glucose 402. HR 37-40. BP OK. Asymptomatic except for tiredness. She is taking Metoprolol XL 100 mg (2  pills once a day) and it looks like the prescription is for one pill BID. Discussed this with her and her family. She will review with Dr. Jeanie Cooks and case will be rescheduled. Discussed with Dr. Paulita Fujita and Dr. Watt Climes. )       Anesthesia Quick Evaluation

## 2016-05-18 NOTE — Progress Notes (Signed)
Pt's procedure cancelled today because HR was 38, blood glucose was 400 and pt had only held her plavix x 1 day/brt

## 2016-05-19 NOTE — ED Provider Notes (Signed)
[facility] DEPT Provider Note   CSN: [id number] Arrival date & time: 05/14/16  1129     History   Chief Complaint Chief Complaint  Patient presents with  . Hematochezia    HPI Kaylee Mullins is a 80 y.o. female.  HPI   80 year old female with generalized weakness and dark colored stools. She reports recent endoscopy. Shortly later she's been having dark colored stools. No gross blood. No fevers or chills. No specific urinary complaints. Nausea, but no vomiting. No appetite. She does not take insulin today because she has not eaten.  Past Medical History:  Diagnosis Date  . Arthritis    knees  . Black tarry stools    05-14-16 negative for occult blood with ER visit- noted in [place].  . Cancer [facility])    thyroid cancer- surgery and radiation  . Chronic kidney disease    questionable mass on kidney. Being followed by Dr Diona Fanti  . Diabetes mellitus    type !  . Full dentures   . Hypertension   . Hypothyroidism   . Spinal stenosis   . Stroke [facility]) 09/2014   left sided weakness    Patient Active Problem List   Diagnosis Date Noted  . Essential hypertension 02/11/2016  . Lumbar stenosis with neurogenic claudication 02/10/2016  . Hyperlipidemia 02/10/2016  . Hypothyroidism 02/10/2016  . Musculoskeletal neck pain 12/13/2014  . Muscular pain 12/13/2014  . Numbness   . Acute CVA (cerebrovascular accident) ([place]) 09/16/2014  . Right renal mass 09/13/2014  . Cerebral infarction due to unspecified mechanism   . CVA (cerebral infarction) 09/11/2014  . Osteoarthritis 09/11/2014  . Postsurgical hypothyroidism 09/11/2014  . Uncontrolled hypertension 09/11/2014  . History of CVA (cerebral vascular accident) ([place]) 09/11/2014  . Diabetes mellitus without complication ([place]) [id number]  . Dyslipidemia 04/20/2007    Past Surgical History:  Procedure Laterality Date  . ABDOMINAL HYSTERECTOMY     partial  . cataracts     Removed  11/2015  bilateral  . COLONOSCOPY W/  POLYPECTOMY    . ERCP N/A 05/07/2016   Procedure: ENDOSCOPIC RETROGRADE CHOLANGIOPANCREATOGRAPHY (ERCP);  Surgeon: Clarene Essex, MD;  Location: [facility] ENDOSCOPY;  Service: Endoscopy;  Laterality: N/A;  . FLEXIBLE SIGMOIDOSCOPY  03/29/2012   Procedure: FLEXIBLE SIGMOIDOSCOPY;  Surgeon: Jeryl Columbia, MD;  Location: [facility] ENDOSCOPY;  Service: Endoscopy;  Laterality: N/A;  fleet enema upon arrival  . HOT HEMOSTASIS  03/29/2012   Procedure: HOT HEMOSTASIS (ARGON PLASMA COAGULATION/BICAP);  Surgeon: Jeryl Columbia, MD;  Location: [facility] ENDOSCOPY;  Service: Endoscopy;  Laterality: N/A;  . LUMBAR LAMINECTOMY/DECOMPRESSION MICRODISCECTOMY N/A 02/10/2016   Procedure: Lumbar three- four Laminectomy;  Surgeon: Kristeen Miss, MD;  Location: [facility] NEURO ORS;  Service: Neurosurgery;  Laterality: N/A;  L3-4 Laminectomy  . LUMBAR SPINE SURGERY     1st surgery "ray cage placed"  . THYROIDECTOMY  2005  . TONSILLECTOMY      OB History    No data available       Home Medications    Prior to Admission medications   Medication Sig Start Date End Date Taking? Authorizing Provider  clopidogrel (PLAVIX) 75 MG tablet Take 1 tablet (75 mg total) by mouth daily. Resume on Sunday please 05/07/16  Yes Clarene Essex, MD  diltiazem [facility]) 360 MG 24 hr capsule Take 360 mg by mouth daily. Reported on 09/19/2015   Yes Historical Provider, MD  hydrOXYzine (ATARAX/VISTARIL) 25 MG tablet Take 25 mg by mouth every 6 (six) hours as needed for itching.  05/05/16  Yes Historical Provider, MD  metoprolol succinate (TOPROL-XL) 100 MG 24 hr tablet Take 100 mg by mouth 2 (two) times daily. Take with or immediately following a meal.   Yes Historical Provider, MD  rosuvastatin (CRESTOR) 20 MG tablet Take 20 mg by mouth daily.  05/02/16  Yes Historical Provider, MD  telmisartan-hydrochlorothiazide (MICARDIS HCT) 80-25 MG tablet Take 1 tablet by mouth daily.  05/01/16  Yes Historical Provider, MD  furosemide (LASIX) 40 MG tablet TAKE 1 TABLET BY MOUTH EVERY DAY AS  NEEDED FOR LEG SWELLING 05/28/15   Historical Provider, MD  HUMALOG MIX 50/50 KWIKPEN (50-50) 100 UNIT/ML Kwikpen INJECT 90 UNITS BEFORE BREAKFAST AND 60 UNITS BEFORE EVENING MEAL 12/03/15   Historical Provider, MD  levothyroxine (SYNTHROID, LEVOTHROID) 200 MCG tablet Take 200 mcg by mouth daily. For hypothyroidism 11/11/15   Historical Provider, MD  oxyCODONE-acetaminophen (PERCOCET/ROXICET) 5-325 MG tablet Take 1-2 tablets by mouth every 4 (four) hours as needed for moderate pain. 02/14/16   Kristeen Miss, MD    Family History Family History  Problem Relation Age of Onset  . Heart failure Mother   . Heart attack Father     Social History Social History  Substance Use Topics  . Smoking status: Never Smoker  . Smokeless tobacco: Never Used  . Alcohol use No     Allergies   Darvon [propoxyphene hcl]; Nyquil multi-symptom [pseudoeph-doxylamine-dm-apap]; and Metformin and related   Review of Systems Review of Systems  All systems reviewed and negative, other than as noted in HPI.   Physical Exam Updated Vital Signs BP 155/59 (BP Location: Left Arm)   Pulse 61   Temp 98 F (36.7 C) (Oral)   Resp 18   Ht 5\' 4"  (1.626 m)   Wt 207 lb (93.9 kg)   SpO2 98%   BMI 35.53 kg/m   Physical Exam  Constitutional: She appears well-developed and well-nourished. No distress.  Laying in bed. Appears tired, but not distressed.  HENT:  Head: Normocephalic and atraumatic.  Eyes: Conjunctivae are normal. Right eye exhibits no discharge. Left eye exhibits no discharge.  Neck: Neck supple.  Cardiovascular: Normal rate, regular rhythm and normal heart sounds.  Exam reveals no gallop and no friction rub.   No murmur heard. Pulmonary/Chest: Effort normal and breath sounds normal. No respiratory distress.  Abdominal: Soft. She exhibits no distension. There is no tenderness.  Musculoskeletal: She exhibits no edema or tenderness.  Neurological: She is alert.  Skin: Skin is warm and dry.    Psychiatric: She has a normal mood and affect. Her behavior is normal. Thought content normal.  Nursing note and vitals reviewed.    ED Treatments / Results  Labs (all labs ordered are listed, but only abnormal results are displayed) Labs Reviewed  CBC WITH DIFFERENTIAL/PLATELET - Abnormal; Notable for the following:       Result Value   RDW 17.5 (*)    All other components within normal limits  BASIC METABOLIC PANEL - Abnormal; Notable for the following:    Sodium 131 (*)    Potassium 3.4 (*)    Chloride 96 (*)    Glucose, Bld 417 (*)    BUN 29 (*)    Creatinine, Ser 1.18 (*)    Calcium 8.0 (*)    GFR calc non Af Amer 43 (*)    GFR calc Af Amer 50 (*)    All other components within normal limits  HEPATIC FUNCTION PANEL - Abnormal; Notable for the following:  Albumin 3.0 (*)    AST 89 (*)    ALT 87 (*)    Alkaline Phosphatase 329 (*)    Total Bilirubin 4.0 (*)    Bilirubin, Direct 1.8 (*)    Indirect Bilirubin 2.2 (*)    All other components within normal limits  URINALYSIS, ROUTINE W REFLEX MICROSCOPIC (NOT AT [facility]) - Abnormal; Notable for the following:    Color, Urine AMBER (*)    APPearance CLOUDY (*)    Glucose, UA >1000 (*)    Hgb urine dipstick TRACE (*)    Bilirubin Urine SMALL (*)    Protein, ur 30 (*)    Leukocytes, UA MODERATE (*)    All other components within normal limits  URINE MICROSCOPIC-ADD ON - Abnormal; Notable for the following:    Squamous Epithelial / LPF 0-5 (*)    Bacteria, UA MANY (*)    All other components within normal limits  CBG MONITORING, ED - Abnormal; Notable for the following:    Glucose-Capillary 325 (*)    All other components within normal limits  LIPASE, BLOOD  POC OCCULT BLOOD, ED    EKG  EKG Interpretation None       Radiology No results found.  Procedures Procedures (including critical care time)  Medications Ordered in ED Medications  sodium chloride 0.9 % bolus 2,000 mL (0 mLs Intravenous Stopped  05/14/16 2108)  insulin aspart (novoLOG) injection 12 Units (12 Units Intravenous Given 05/14/16 1807)  potassium chloride SA (K-DUR,KLOR-CON) CR tablet 40 mEq (40 mEq Oral Given 05/14/16 1906)  insulin aspart (novoLOG) injection 10 Units (10 Units Intravenous Given 05/14/16 1937)     Initial Impression / Assessment and Plan / ED Course  I have reviewed the triage vital signs and the nursing notes.  Pertinent labs & imaging results that were available during my care of the patient were reviewed by me and considered in my medical decision making (see chart for details).  Clinical Course    80 year old female with dark color stools and generalized weakness. Suspect mild dehydration and deconditioning. Stool heme negative. H&H is stable. She is hemodynamically stable. She is treated symptomatically for her hyperglycemia with fluids and insulin.It has been determined that no acute conditions requiring further emergency intervention are present at this time. The patient has been advised of the diagnosis and plan. I reviewed any labs and imaging including any potential incidental findings. We have discussed signs and symptoms that warrant return to the ED and they are listed in the discharge instructions.    Final Clinical Impressions(s) / ED Diagnoses   Final diagnoses:  Generalized weakness  Dehydration    New Prescriptions Discharge Medication List as of 05/14/2016  8:38 PM       Virgel Manifold, MD 05/19/16 1026

## 2016-05-25 ENCOUNTER — Other Ambulatory Visit: Payer: Self-pay | Admitting: Gastroenterology

## 2016-05-25 ENCOUNTER — Encounter (HOSPITAL_COMMUNITY): Payer: Self-pay | Admitting: *Deleted

## 2016-05-27 ENCOUNTER — Ambulatory Visit (HOSPITAL_COMMUNITY): Payer: Medicare Other | Admitting: Anesthesiology

## 2016-05-27 ENCOUNTER — Encounter (HOSPITAL_COMMUNITY)
Admission: RE | Disposition: A | Discharge status: Home / Self Care | Payer: Self-pay | Source: Ambulatory Visit | Attending: Gastroenterology

## 2016-05-27 ENCOUNTER — Ambulatory Visit (HOSPITAL_COMMUNITY)
Admission: RE | Admit: 2016-05-27 | Discharge: 2016-05-27 | Disposition: A | Discharge status: Home / Self Care | Payer: Medicare Other | Source: Ambulatory Visit | Attending: Gastroenterology | Admitting: Gastroenterology

## 2016-05-27 ENCOUNTER — Encounter (HOSPITAL_COMMUNITY): Payer: Self-pay | Admitting: *Deleted

## 2016-05-27 DIAGNOSIS — E039 Hypothyroidism, unspecified: Secondary | ICD-10-CM | POA: Diagnosis not present

## 2016-05-27 DIAGNOSIS — R590 Localized enlarged lymph nodes: Secondary | ICD-10-CM | POA: Insufficient documentation

## 2016-05-27 DIAGNOSIS — K862 Cyst of pancreas: Secondary | ICD-10-CM | POA: Insufficient documentation

## 2016-05-27 DIAGNOSIS — I10 Essential (primary) hypertension: Secondary | ICD-10-CM | POA: Insufficient documentation

## 2016-05-27 DIAGNOSIS — K8689 Other specified diseases of pancreas: Secondary | ICD-10-CM | POA: Insufficient documentation

## 2016-05-27 DIAGNOSIS — K838 Other specified diseases of biliary tract: Secondary | ICD-10-CM | POA: Diagnosis not present

## 2016-05-27 DIAGNOSIS — Z800 Family history of malignant neoplasm of digestive organs: Secondary | ICD-10-CM | POA: Diagnosis not present

## 2016-05-27 DIAGNOSIS — E785 Hyperlipidemia, unspecified: Secondary | ICD-10-CM | POA: Insufficient documentation

## 2016-05-27 DIAGNOSIS — M1990 Unspecified osteoarthritis, unspecified site: Secondary | ICD-10-CM | POA: Insufficient documentation

## 2016-05-27 DIAGNOSIS — Z79899 Other long term (current) drug therapy: Secondary | ICD-10-CM | POA: Insufficient documentation

## 2016-05-27 DIAGNOSIS — E119 Type 2 diabetes mellitus without complications: Secondary | ICD-10-CM | POA: Insufficient documentation

## 2016-05-27 DIAGNOSIS — Z8673 Personal history of transient ischemic attack (TIA), and cerebral infarction without residual deficits: Secondary | ICD-10-CM | POA: Insufficient documentation

## 2016-05-27 DIAGNOSIS — Z794 Long term (current) use of insulin: Secondary | ICD-10-CM | POA: Diagnosis not present

## 2016-05-27 DIAGNOSIS — R740 Nonspecific elevation of levels of transaminase and lactic acid dehydrogenase [LDH]: Secondary | ICD-10-CM | POA: Diagnosis not present

## 2016-05-27 DIAGNOSIS — Z85850 Personal history of malignant neoplasm of thyroid: Secondary | ICD-10-CM | POA: Insufficient documentation

## 2016-05-27 HISTORY — PX: EUS: SHX5427

## 2016-05-27 HISTORY — DX: Adverse effect of unspecified anesthetic, initial encounter: T41.45XA

## 2016-05-27 HISTORY — DX: Other complications of anesthesia, initial encounter: T88.59XA

## 2016-05-27 LAB — GLUCOSE, CAPILLARY: Glucose-Capillary: 154 mg/dL — ABNORMAL HIGH (ref 65–99)

## 2016-05-27 SURGERY — ESOPHAGEAL ENDOSCOPIC ULTRASOUND (EUS) RADIAL
Anesthesia: Monitor Anesthesia Care

## 2016-05-27 MED ORDER — PROPOFOL 10 MG/ML IV BOLUS
INTRAVENOUS | Status: AC
  Filled 2016-05-27: qty 20

## 2016-05-27 MED ORDER — PROPOFOL 10 MG/ML IV BOLUS
INTRAVENOUS | Status: DC | PRN
  Administered 2016-05-27 (×2): 20 mg via INTRAVENOUS
  Administered 2016-05-27: 10 mg via INTRAVENOUS
  Administered 2016-05-27: 30 mg via INTRAVENOUS
  Administered 2016-05-27: 20 mg via INTRAVENOUS

## 2016-05-27 MED ORDER — SODIUM CHLORIDE 0.9 % IV SOLN: INTRAVENOUS | Status: DC

## 2016-05-27 MED ORDER — PROPOFOL 500 MG/50ML IV EMUL
INTRAVENOUS | Status: DC | PRN
  Administered 2016-05-27: 75 ug/kg/min via INTRAVENOUS

## 2016-05-27 MED ORDER — LACTATED RINGERS IV SOLN
INTRAVENOUS | Status: DC
  Administered 2016-05-27: 1000 mL via INTRAVENOUS

## 2016-05-27 NOTE — Anesthesia Preprocedure Evaluation (Addendum)
Anesthesia Evaluation  Patient identified by MRN, date of birth, ID band Patient awake    Reviewed: Allergy & Precautions, NPO status , Patient's Chart, lab work & pertinent test results, reviewed documented beta blocker date and time   Airway Mallampati: II  TM Distance: >3 FB Neck ROM: Full    Dental  (+) Upper Dentures, Lower Dentures   Pulmonary neg pulmonary ROS,    breath sounds clear to auscultation       Cardiovascular hypertension, Pt. on medications and Pt. on home beta blockers  Rhythm:Regular Rate:Normal     Neuro/Psych CVA, Residual Symptoms negative psych ROS   GI/Hepatic negative GI ROS, Neg liver ROS,   Endo/Other  diabetes, Type 1, Insulin DependentHypothyroidism   Renal/GU   negative genitourinary   Musculoskeletal  (+) Arthritis , Osteoarthritis,    Abdominal   Peds negative pediatric ROS (+)  Hematology negative hematology ROS (+)   Anesthesia Other Findings   Reproductive/Obstetrics negative OB ROS                            Anesthesia Physical Anesthesia Plan  ASA: II  Anesthesia Plan: MAC   Post-op Pain Management:    Induction: Intravenous  Airway Management Planned: Natural Airway  Additional Equipment:   Intra-op Plan:   Post-operative Plan:   Informed Consent: I have reviewed the patients History and Physical, chart, labs and discussed the procedure including the risks, benefits and alternatives for the proposed anesthesia with the patient or authorized representative who has indicated his/her understanding and acceptance.     Plan Discussed with: CRNA  Anesthesia Plan Comments:        Anesthesia Quick Evaluation

## 2016-05-27 NOTE — Discharge Instructions (Signed)
Endoscopic Ultrasound ° °Care After °Please read the instructions outlined below and refer to this sheet in the next few weeks. These discharge instructions provide you with general information on caring for yourself after you leave the hospital. Your doctor may also give you specific instructions. While your treatment has been planned according to the most current medical practices available, unavoidable complications occasionally occur. If you have any problems or questions after discharge, please call Dr. Nishant Schrecengost ([facility]) at [phone]. ° °HOME CARE INSTRUCTIONS °Activity °· You may resume your regular activity but move at a slower pace for the next 24 hours.  °· Take frequent rest periods for the next 24 hours.  °· Walking will help expel (get rid of) the air and reduce the bloated feeling in your abdomen.  °· No driving for 24 hours (because of the anesthesia (medicine) used during the test).  °· You may shower.  °· Do not sign any important legal documents or operate any machinery for 24 hours (because of the anesthesia used during the test).  °Nutrition °· Drink plenty of fluids.  °· You may resume your normal diet.  °· Begin with a light meal and progress to your normal diet.  °· Avoid alcoholic beverages for 24 hours or as instructed by your caregiver.  °Medications °You may resume your normal medications unless your caregiver tells you otherwise. °What you can expect today °· You may experience abdominal discomfort such as a feeling of fullness or "gas" pains.  °· You may experience a sore throat for 2 to 3 days. This is normal. Gargling with salt water may help this.  °·  °SEEK IMMEDIATE MEDICAL CARE IF: °· You have excessive nausea (feeling sick to your stomach) and/or vomiting.  °· You have severe abdominal pain and distention (swelling).  °· You have trouble swallowing.  °· You have a temperature over 100° F (37.8° C).  °· You have rectal bleeding or vomiting of blood.  °Document  Released: 03/17/2004 Document Revised: 04/15/2011 Document Reviewed: 09/28/2007 °ExitCare® Patient Information ©2012 [employer]. °

## 2016-05-27 NOTE — Op Note (Addendum)
[facility] Patient Name: Kaylee Mullins Procedure Date: 05/27/2016 MRN: [id number] Attending MD: Arta Silence , MD Date of Birth: 03-24-1936 CSN: [id number] Age: 80 Admit Type: Outpatient Procedure:                Upper EUS Indications:              Dilated pancreatic duct on MRI, Pancreatic cyst on                            MRI, Elevated liver enzymes Providers:                Arta Silence, MD, Cleda Daub, RN, Delena Bali, CRNA Referring MD:             Nolene Ebbs, MD; Clarene Essex, MD Medicines:                Propofol per Anesthesia Complications:            No immediate complications. Estimated Blood Loss:     Estimated blood loss was minimal. Procedure:                Pre-Anesthesia Assessment:                           - Prior to the procedure, a History and Physical                            was performed, and patient medications and                            allergies were reviewed. The patient's tolerance of                            previous anesthesia was also reviewed. The risks                            and benefits of the procedure and the sedation                            options and risks were discussed with the patient.                            All questions were answered, and informed consent                            was obtained. Prior Anticoagulants: The patient has                            taken Plavix (clopidogrel), last dose was 5 days                            prior to procedure. ASA Grade Assessment: II - A  patient with mild systemic disease. After reviewing                            the risks and benefits, the patient was deemed in                            satisfactory condition to undergo the procedure.                           After obtaining informed consent, the endoscope was                            passed under direct vision. Throughout the                             procedure, the patient's blood pressure, pulse, and                            oxygen saturations were monitored continuously. The                            [id number]) scope was introduced through                            the mouth, and advanced to the second part of                            duodenum. The [id number]) scope was                            introduced through the mouth, and advanced to the                            second part of duodenum. The upper EUS was                            accomplished without difficulty. The patient                            tolerated the procedure well. Scope In: 10:35:00 PM Scope Out: 10:52:00 PM Total Procedure Duration: 0 hours 17 minutes 0 seconds  Findings:      Endosonographic Finding :      Endosonographic images of the stomach were unremarkable.      Prominent ampulla without obvious evidence of mass.      Anechoic and multicystic lesions suggestive of multiple cysts were       identified in the pancreatic head and in the uncinate process of the       pancreas. The largest lesion measured 25 mm by 35 mm in maximal       cross-sectional diameter. No clear dominant mass or mural nodule, but       there was extensive anatomic distortion and irregularity from these       cysts and the dilated pancreatic and bile duct and pancreatic duct side  branches, making optimal visualization of this region difficult.      Two enlarged lymph nodes were visualized in the peripancreatic region.       These were periampullary/periampullary in location. The nodes were       hypoechoic. Too small to biopsy.      There was dilation in the common bile duct which measured up to 13 mm.      The pancreatic duct had a beaded endosonographic appearance in the       pancreatic head, in the genu of the pancreas, in the body of the       pancreas, in the tail of the pancreas, in the main pancreatic duct and       in the  side branches of the pancreatic duct.      The pancreatic duct had a dilated endosonographic appearance in the       pancreatic head, in the body of the pancreas and in the tail of the       pancreas. The pancreatic duct measured up to 8 mm in diameter. Impression:               - Endosonographic images of the stomach were                            unremarkable.                           - Multiple cystic lesions were seen in the                            pancreatic head and in the uncinate process of the                            pancreas.                           - Two enlarged lymph nodes were visualized in the                            peripancreatic region.                           - There was dilation in the common bile duct which                            measured up to 13 mm.                           - The pancreatic duct had a beaded endosonographic                            appearance in the pancreatic head, in the genu of                            the pancreas, in the body of the pancreas, in the  tail of the pancreas, in the main pancreatic duct                            and in the side branches of the pancreatic duct.                           - The pancreatic duct had a dilated endosonographic                            appearance in the pancreatic head, in the body of                            the pancreas and in the tail of the pancreas. The                            pancreatic duct measured up to 8 mm in diameter.                           - Overall constellation of findings consistent with                            main and side branch IPMN. While no definitive mass                            was seen, and the irregular multicystic appearance                            of the pancreas makes definitive exclusion of                            malignancy difficult, the development of jaundice                            (now which has  largely resolved) and the findings                            of these small peripancreatic lymph nodes makes one                            worried about malignant degeneration. Moderate Sedation:      None Recommendation:           - Discharge patient to home (via wheelchair).                           - Resume previous diet today.                           - Continue present medications.                           - Return to GI clinic at appointment to be  scheduled. Might recheck MRI in 6-8 weeks, might                            check Ca 19-9 and CEA.                           - Management strategies hampered by patient's                            comorbidities and the extensive surgery (likely                            complete pancreatectomy) that would be likely                            required should this be a malignant process.                           - Return to referring physician as previously                            scheduled. Procedure Code(s):        --- Professional ---                           [phone], Esophagogastroduodenoscopy, flexible,                            transoral; with endoscopic ultrasound examination,                            including the esophagus, stomach, and either the                            duodenum or a surgically altered stomach where the                            jejunum is examined distal to the anastomosis Diagnosis Code(s):        --- Professional ---                           [id number], Cyst of pancreas                           R59.0, Localized enlarged lymph nodes                           K86.89, Other specified diseases of pancreas                           R74.8, Abnormal levels of other serum enzymes                           K83.8, Other specified diseases of biliary tract  R93.3, Abnormal findings on diagnostic imaging of                            other parts of  digestive tract CPT copyright 2016 [employer]. All rights reserved. The codes documented in this report are preliminary and upon coder review may  be revised to meet current compliance requirements. Arta Silence, MD 05/27/2016 11:14:53 AM This report has been signed electronically. Clarene Essex, MD Number of Addenda: 0

## 2016-05-27 NOTE — Transfer of Care (Signed)
Immediate Anesthesia Transfer of Care Note  Patient: Kaylee Mullins  Procedure(s) Performed: Procedure(s): ESOPHAGEAL ENDOSCOPIC ULTRASOUND (EUS) RADIAL (N/A)  Patient Location: PACU  Anesthesia Type:MAC  Level of Consciousness:  sedated, patient cooperative and responds to stimulation  Airway & Oxygen Therapy:Patient Spontanous Breathing and Patient connected to face mask oxgen  Post-op Assessment:  Report given to PACU RN and Post -op Vital signs reviewed and stable  Post vital signs:  Reviewed and stable  Last Vitals:  Vitals:   05/27/16 0903  BP: (!) 205/67  Resp: 20  Temp: 123XX123 C    Complications: No apparent anesthesia complications

## 2016-05-27 NOTE — H&P (Signed)
Patient interval history reviewed.  Patient examined again.  There has been no change from documented H/P dated 05/06/16 (scanned into chart from our office) except as documented above.  Assessment:  1.  Pancreatic lesion.  Plan:  1.  Endoscopic ultrasound with possible fine needle aspiration (FNA) biopsies. 2.  Risks (bleeding, infection, bowel perforation that could require surgery, sedation-related changes in cardiopulmonary systems), benefits (identification and possible treatment of source of symptoms, exclusion of certain causes of symptoms), and alternatives (watchful waiting, radiographic imaging studies, empiric medical treatment) of upper endoscopy with ultrasound and possible biopsies (EUS +/- FNA) were explained to patient/family in detail and patient wishes to proceed.

## 2016-05-27 NOTE — Anesthesia Postprocedure Evaluation (Signed)
Anesthesia Post Note  Patient: Kaylee Mullins  Procedure(s) Performed: Procedure(s) (LRB): ESOPHAGEAL ENDOSCOPIC ULTRASOUND (EUS) RADIAL (N/A)  Patient location during evaluation: PACU Anesthesia Type: MAC Level of consciousness: awake and alert Pain management: pain level controlled Vital Signs Assessment: post-procedure vital signs reviewed and stable Respiratory status: spontaneous breathing, nonlabored ventilation, respiratory function stable and patient connected to nasal cannula oxygen Cardiovascular status: stable and blood pressure returned to baseline Anesthetic complications: no    Last Vitals:  Vitals:   05/27/16 1130 05/27/16 1139  BP: (!) 213/75   Pulse: (!) 49 (!) 49  Resp: (!) 22 16  Temp:      Last Pain:  Vitals:   05/27/16 1101  TempSrc: Oral                 Effie Berkshire

## 2016-05-29 ENCOUNTER — Encounter (HOSPITAL_COMMUNITY): Payer: Self-pay | Admitting: Gastroenterology

## 2016-06-04 ENCOUNTER — Other Ambulatory Visit: Payer: Self-pay | Admitting: Urology

## 2016-06-04 DIAGNOSIS — N2889 Other specified disorders of kidney and ureter: Secondary | ICD-10-CM

## 2016-06-19 ENCOUNTER — Telehealth: Payer: Self-pay | Admitting: *Deleted

## 2016-06-19 NOTE — Telephone Encounter (Signed)
Rn spoke with patient about having someone clean her house. Rn ask pt if she has PT, OT, St, or nurse coming to the house now. Pt stated she had inpatient rehab in the past,and home health. PT stated currently she is not getting any services.Rn ask patient if she needs assistance with ADL, and self care issues. PT stated she dont need help with getting a bath or cooking. She just needs someone to help clean her house up and do household work. Pt stated her friend has a home health aid to clean the house. Rn stated a referral cannot be done just to clean the house up. Rn advise patient to call Dept of social services about some resources for housecleaning. PT stated her friend had a stroke and has a aid to clean the house. Rn requested patient to ask her friend what agency she is using to find out the guidelines. Pt verbalized understanding.

## 2016-06-23 ENCOUNTER — Ambulatory Visit: Payer: Self-pay | Admitting: Neurology

## 2016-06-26 ENCOUNTER — Ambulatory Visit: Payer: Medicare Other | Admitting: Neurology

## 2016-06-30 ENCOUNTER — Ambulatory Visit
Admission: RE | Admit: 2016-06-30 | Discharge: 2016-06-30 | Disposition: A | Discharge status: Home / Self Care | Payer: Medicare Other | Source: Ambulatory Visit | Attending: Urology | Admitting: Urology

## 2016-06-30 ENCOUNTER — Other Ambulatory Visit: Payer: Self-pay | Admitting: Interventional Radiology

## 2016-06-30 DIAGNOSIS — N2889 Other specified disorders of kidney and ureter: Secondary | ICD-10-CM

## 2016-06-30 HISTORY — PX: IR GENERIC HISTORICAL: IMG1180011

## 2016-06-30 NOTE — Consult Note (Signed)
Chief Complaint: Patient was seen in consultation today for cryoablation of a right renal mass at the request of Dr. Franchot Gallo.  Referring Physician(s): Dahlstedt,Stephen  History of Present Illness: Kaylee Mullins is a 80 y.o. female who had a CT of the chest, abdomen and pelvis on 09/12/2014 during workup of left-sided neuropathy and weakness that revealed the presence of a solid, enhancing mass emanating from the upper pole of the right kidney and measuring 3.5 cm in greatest diameter. This was followed by CT on 12/17/2014 demonstrating no significant growth. CT studies have demonstrated no evidence of metastatic disease or renal vein involvement. MRI in December, 2016 showed no significant growth. Most recent MRI on 04/24/2016 demonstrated some interval growth with maximum diameter of approximately 3.9 cm.  She is asymptomatic with respect to the right renal mass.  The MRI also demonstrated progressive biliary and pancreatic ductal dilatation with multilobular cystic lesion in the head of the pancreas. ERCP was performed on 05/07/2016 by Dr. Watt Climes with negative bile duct brushing. EUS was performed by Dr. 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 She has a follow-up appointment with Dr. Watt Climes on 07/16/2016.  [name] is on chronic Plavix due to a right thalamic cerebral infarct. She has a follow-up appointment with Dr. Leonie Man on 07/21/2018.  Past Medical History:  Diagnosis Date  . Arthritis    knees  . Black tarry stools    05-14-16 negative for occult blood with ER visit- noted in [place].  . Cancer [facility])    thyroid cancer- surgery and radiation  . Chronic kidney disease    questionable mass on kidney. Being followed by Dr Diona Fanti  . Complication of anesthesia    heart rate was really low  . Diabetes mellitus    type !  . Full dentures   . Hypertension   . Hypothyroidism   .  Spinal stenosis   . Stroke [facility]) 09/2014   left sided weakness    Past Surgical History:  Procedure Laterality Date  . ABDOMINAL HYSTERECTOMY     partial  . cataracts     Removed  11/2015  bilateral  . COLONOSCOPY W/ POLYPECTOMY    . ERCP N/A 05/07/2016   Procedure: ENDOSCOPIC RETROGRADE CHOLANGIOPANCREATOGRAPHY (ERCP);  Surgeon: Clarene Essex, MD;  Location: [facility];  Service: Endoscopy;  Laterality: N/A;  . EUS N/A 05/27/2016   Procedure: ESOPHAGEAL ENDOSCOPIC ULTRASOUND (EUS) RADIAL;  Surgeon: Arta Silence, MD;  Location: [facility] ENDOSCOPY;  Service: Endoscopy;  Laterality: N/A;  . FLEXIBLE SIGMOIDOSCOPY  03/29/2012   Procedure: FLEXIBLE SIGMOIDOSCOPY;  Surgeon: Jeryl Columbia, MD;  Location: [facility] ENDOSCOPY;  Service: Endoscopy;  Laterality: N/A;  fleet enema upon arrival  . HOT HEMOSTASIS  03/29/2012   Procedure: HOT HEMOSTASIS (ARGON PLASMA COAGULATION/BICAP);  Surgeon: Jeryl Columbia, MD;  Location: [facility] ENDOSCOPY;  Service: Endoscopy;  Laterality: N/A;  . LUMBAR LAMINECTOMY/DECOMPRESSION MICRODISCECTOMY N/A 02/10/2016   Procedure: Lumbar three- four Laminectomy;  Surgeon: Kristeen Miss, MD;  Location: [facility] NEURO ORS;  Service: Neurosurgery;  Laterality: N/A;  L3-4 Laminectomy  . LUMBAR SPINE SURGERY     1st surgery "ray cage placed"  . THYROIDECTOMY  2005  . TONSILLECTOMY      Allergies: Darvon [propoxyphene hcl]; Nyquil multi-symptom [pseudoeph-doxylamine-dm-apap]; and Metformin and related  Medications: Prior to Admission medications   Medication Sig Start Date End Date Taking? Authorizing Provider  clopidogrel (PLAVIX) 75 MG tablet Take 1 tablet (  75 mg total) by mouth daily. Resume on Sunday please 05/07/16  Yes Clarene Essex, MD  cyanocobalamin 1000 MCG tablet Take 1,000 mcg by mouth daily.   Yes Historical Provider, MD  furosemide (LASIX) 40 MG tablet TAKE 1 TABLET BY MOUTH EVERY DAY AS NEEDED FOR LEG SWELLING 05/28/15  Yes Historical Provider, MD  HUMALOG MIX 50/50 KWIKPEN (50-50) 100  UNIT/ML Kwikpen INJECT 90 UNITS BEFORE BREAKFAST AND 60 UNITS BEFORE EVENING MEAL 12/03/15  Yes Historical Provider, MD  levothyroxine (SYNTHROID, LEVOTHROID) 200 MCG tablet Take 200 mcg by mouth daily. For hypothyroidism 11/11/15  Yes Historical Provider, MD  metoprolol succinate (TOPROL-XL) 100 MG 24 hr tablet Take 100 mg by mouth daily. Take with or immediately following a meal.    Yes Historical Provider, MD  oxyCODONE-acetaminophen (PERCOCET/ROXICET) 5-325 MG tablet Take 1-2 tablets by mouth every 4 (four) hours as needed for moderate pain. 02/14/16  Yes Kristeen Miss, MD  rosuvastatin (CRESTOR) 20 MG tablet Take 20 mg by mouth daily.  05/02/16  Yes Historical Provider, MD  telmisartan-hydrochlorothiazide (MICARDIS HCT) 80-25 MG tablet Take 1 tablet by mouth daily.  05/01/16  Yes Historical Provider, MD  hydrOXYzine (ATARAX/VISTARIL) 25 MG tablet Take 25 mg by mouth every 6 (six) hours as needed for itching.  05/05/16   Historical Provider, MD     Family History  Problem Relation Age of Onset  . Heart failure Mother   . Heart attack Father     Social History   Social History  . Marital status: Widowed    Spouse name: N/A  . Number of children: N/A  . Years of education: N/A   Occupational History  . retired     Buyer, retail   Social History Main Topics  . Smoking status: Never Smoker  . Smokeless tobacco: Never Used  . Alcohol use No  . Drug use: No  . Sexual activity: Not on file   Other Topics Concern  . Not on file   Social History Narrative   Separated, retired, 3 children living, 2 children deceased   Caffeine use - soda every few days   Right handed   Pt lives alone.  Using cane when out and about.     Review of Systems: A 12 point ROS discussed and pertinent positives are indicated in the HPI above.  All other systems are negative.  Review of Systems  Constitutional: Negative.   HENT: Negative.   Respiratory: Negative.   Cardiovascular: Negative.     Gastrointestinal: Negative.   Genitourinary: Negative.   Musculoskeletal: Negative.   Neurological: Negative.     Vital Signs: BP (!) 149/84 (BP Location: Right Arm, Patient Position: Sitting, Cuff Size: Normal)   Pulse 71   Temp 98.1 F (36.7 C) (Oral)   Resp 15   Ht _0  (1.626 m)   Wt 210 lb (95.3 kg)   SpO2 100%   BMI 36.05 kg/m   Physical Exam  Constitutional: She is oriented to person, place, and time. She appears well-developed. No distress.  HENT:  Head: Normocephalic and atraumatic.  Neck: Neck supple. No JVD present. No tracheal deviation present.  Cardiovascular: Normal rate, regular rhythm and normal heart sounds.  Exam reveals no gallop and no friction rub.   No murmur heard. Pulmonary/Chest: Effort normal. No stridor. No respiratory distress. She has no wheezes. She has no rales.  Abdominal: Soft. Bowel sounds are normal. She exhibits no distension and no mass. There is no tenderness. There is no rebound  and no guarding.  Musculoskeletal:  Mild edema of both lower legs.  Lymphadenopathy:    She has no cervical adenopathy.  Neurological: She is alert and oriented to person, place, and time.  Skin: She is not diaphoretic.  Nursing note and vitals reviewed.   Labs:  CBC:  Recent Labs  02/10/16 1356 02/13/16 0228 02/14/16 0631 05/14/16 1258  WBC 7.5 13.3* 11.0* 7.8  HGB 12.9 11.7* 11.5* 12.5  HCT 39.5 35.7* 35.1* 36.3  PLT 276 213 242 310    COAGS: No results for input(s): INR, APTT in the last 8760 hours.  BMP:  Recent Labs  02/10/16 1356 02/12/16 0919 02/13/16 0228 05/14/16 1258  NA 139 136 138 131*  K 4.2 3.6 3.5 3.4*  CL 105 102 100* 96*  CO2 _0 GLUCOSE 212* 252* 141* 417*  BUN _1 29*  CALCIUM 9.4 8.5* 8.6* 8.0*  CREATININE 1.13* 1.06* 0.93 1.18*  GFRNONAA 46* 49* 58* 43*  GFRAA 53* 57* >60 50*    LIVER FUNCTION TESTS:  Recent Labs  02/13/16 0228 02/14/16 0631 05/14/16 1258 05/18/16 1220  BILITOT 1.3*  1.0 4.0* 2.6*  AST 137* 85* 89* 155*  ALT 93* 76* 87* 106*  ALKPHOS 92 101 329* 545*  PROT 6.8 6.4* 7.7 7.2  ALBUMIN 3.2* 2.8* 3.0* 2.7*     Assessment and Plan:  I met with Mrs. Spires and her granddaughter. We reviewed all of her prior abdominal cross-sectional imaging. The right upper pole renal neoplasm shows enhancement and is solid. This demonstrates slow growth over time and is predominantly exophytic. 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 There is no evidence of renal vein involvement, local metastatic disease or metastatic disease in the chest.   The lesion currently is in a location and of size amenable to percutaneous treatment with cryoablation. Given comorbidities, a less invasive nephron sparing procedure would be indicated. She would need to be off Plavix for 5 days prior to the procedure. Separate biopsy prior to pursuing ablation is not necessary due to high suspicion of malignancy. Biopsy can be performed at the time of ablation.  Details and risks of ablative treatment of the kidney were discussed with the patient. The procedure is performed under general anesthesia at [facility] with overnight observation after the procedure.  After discussion, Mrs. Koleen Nimrod would like to proceed with cryoablation of the right renal neoplasm. We will begin the scheduling process. I told her that I would anticipate being able to perform ablation in mid/late December or early January.  Electronically SignedAletta Edouard T 06/30/2016, 10:32 AM   I spent a total of 40 Minutes in face to face in clinical consultation, greater than 50% of which was counseling/coordinating care for ablation of a right renal neoplasm.

## 2016-07-01 ENCOUNTER — Other Ambulatory Visit (HOSPITAL_COMMUNITY): Payer: Self-pay | Admitting: Interventional Radiology

## 2016-07-01 DIAGNOSIS — N2889 Other specified disorders of kidney and ureter: Secondary | ICD-10-CM

## 2016-07-02 NOTE — Progress Notes (Signed)
Scheduling pre op appt- please place PROCEDURE ORDERS IN EPIC  thanks

## 2016-07-03 ENCOUNTER — Other Ambulatory Visit: Payer: Self-pay | Admitting: Radiology

## 2016-07-06 NOTE — Patient Instructions (Addendum)
Kaylee Mullins  07/06/2016   Your procedure is scheduled on: Wednesday 07/15/2016  Report to [facility] Main  Entrance and report to Radiology on the first floor  at   0945 AM.  Call this number if you have problems the morning of surgery [phone]   Remember: ONLY 1 PERSON MAY GO WITH YOU TO SHORT STAY TO GET  READY MORNING OF [place].     Do not eat food or drink liquids :After Midnight.     Take these medicines the morning of surgery with A SIP OF WATER: LEVOTHYROXINE, METOPROLOL              How to Manage Your Diabetes Before and After Surgery  Why is it important to control my blood sugar before and after surgery? . Improving blood sugar levels before and after surgery helps healing and can limit problems. . A way of improving blood sugar control is eating a healthy diet by: o  Eating less sugar and carbohydrates o  Increasing activity/exercise o  Talking with your doctor about reaching your blood sugar goals . High blood sugars (greater than 180 mg/dL) can raise your risk of infections and slow your recovery, so you will need to focus on controlling your diabetes during the weeks before surgery. . Make sure that the doctor who takes care of your diabetes knows about your planned surgery including the date and location.  How do I manage my blood sugar before surgery? . Check your blood sugar at least 4 times a day, starting 2 days before surgery, to make sure that the level is not too high or low. o Check your blood sugar the morning of your surgery when you wake up and every 2 hours until you get to the Short Stay unit. . If your blood sugar is less than 70 mg/dL, you will need to treat for low blood sugar: o Do not take insulin. o Treat a low blood sugar (less than 70 mg/dL) with  cup of clear juice (cranberry or apple), 4 glucose tablets, OR glucose gel. o Recheck blood sugar in 15 minutes after treatment (to make sure it is greater than  70 mg/dL). If your blood sugar is not greater than 70 mg/dL on recheck, call [phone] for further instructions. . Report your blood sugar to the short stay nurse when you get to Short Stay.  . If you are admitted to the hospital after surgery: o Your blood sugar will be checked by the staff and you will probably be given insulin after surgery (instead of oral diabetes medicines) to make sure you have good blood sugar levels. o The goal for blood sugar control after surgery is 80-180 mg/dL.   WHAT DO I DO ABOUT MY DIABETES MEDICATION?  Marland Kitchen Do not take oral diabetes medicines (pills) the morning of surgery.  . THE NIGHT BEFORE SURGERY, take _____42_____ units of __HUMALOG MIX 50/50 KWIKPEN _________insulin.       . THE MORNING OF SURGERY, take ________0 _____ units of _____HUMALOG MIX 50/50 KWIKPEN_____insulin.                                   You may not have any metal on your body including hair pins and              piercings  Do not wear jewelry, make-up, lotions, powders or perfumes, deodorant             Do not wear nail polish.  Do not shave  48 hours prior to surgery.              Men may shave face and neck.   Do not bring valuables to the hospital. [place].  Contacts, dentures or bridgework may not be worn into surgery.  Leave suitcase in the car. After surgery it may be brought to your room.                  Please read over the following fact sheets you were given: _____________________________________________________________________             [facility] - Preparing for Surgery Before surgery, you can play an important role.  Because skin is not sterile, your skin needs to be as free of germs as possible.  You can reduce the number of germs on your skin by washing with CHG (chlorahexidine gluconate) soap before surgery.  CHG is an antiseptic cleaner which kills germs and bonds with the skin to continue killing  germs even after washing. Please DO NOT use if you have an allergy to CHG or antibacterial soaps.  If your skin becomes reddened/irritated stop using the CHG and inform your nurse when you arrive at Short Stay. Do not shave (including legs and underarms) for at least 48 hours prior to the first CHG shower.  You may shave your face/neck. Please follow these instructions carefully:  1.  Shower with CHG Soap the night before surgery and the  morning of Surgery.  2.  If you choose to wash your hair, wash your hair first as usual with your  normal  shampoo.  3.  After you shampoo, rinse your hair and body thoroughly to remove the  shampoo.                           4.  Use CHG as you would any other liquid soap.  You can apply chg directly  to the skin and wash                       Gently with a scrungie or clean washcloth.  5.  Apply the CHG Soap to your body ONLY FROM THE NECK DOWN.   Do not use on face/ open                           Wound or open sores. Avoid contact with eyes, ears mouth and genitals (private parts).                       Wash face,  Genitals (private parts) with your normal soap.             6.  Wash thoroughly, paying special attention to the area where your surgery  will be performed.  7.  Thoroughly rinse your body with warm water from the neck down.  8.  DO NOT shower/wash with your normal soap after using and rinsing off  the CHG Soap.                9.  Pat yourself dry with  a clean towel.            10.  Wear clean pajamas.            11.  Place clean sheets on your bed the night of your first shower and do not  sleep with pets. Day of Surgery : Do not apply any lotions/deodorants the morning of surgery.  Please wear clean clothes to the hospital/surgery center.  FAILURE TO FOLLOW THESE INSTRUCTIONS MAY RESULT IN THE CANCELLATION OF YOUR SURGERY PATIENT SIGNATURE_________________________________  NURSE  SIGNATURE__________________________________  ________________________________________________________________________  WHAT IS A BLOOD TRANSFUSION? Blood Transfusion Information  A transfusion is the replacement of blood or some of its parts. Blood is made up of multiple cells which provide different functions.  Red blood cells carry oxygen and are used for blood loss replacement.  White blood cells fight against infection.  Platelets control bleeding.  Plasma helps clot blood.  Other blood products are available for specialized needs, such as hemophilia or other clotting disorders. BEFORE THE TRANSFUSION  Who gives blood for transfusions?   Healthy volunteers who are fully evaluated to make sure their blood is safe. This is blood bank blood. Transfusion therapy is the safest it has ever been in the practice of medicine. Before blood is taken from a donor, a complete history is taken to make sure that person has no history of diseases nor engages in risky social behavior (examples are intravenous drug use or sexual activity with multiple partners). The donor's travel history is screened to minimize risk of transmitting infections, such as malaria. The donated blood is tested for signs of infectious diseases, such as HIV and hepatitis. The blood is then tested to be sure it is compatible with you in order to minimize the chance of a transfusion reaction. If you or a relative donates blood, this is often done in anticipation of surgery and is not appropriate for emergency situations. It takes many days to process the donated blood. RISKS AND COMPLICATIONS Although transfusion therapy is very safe and saves many lives, the main dangers of transfusion include:   Getting an infectious disease.  Developing a transfusion reaction. This is an allergic reaction to something in the blood you were given. Every precaution is taken to prevent this. The decision to have a blood transfusion has been  considered carefully by your caregiver before blood is given. Blood is not given unless the benefits outweigh the risks. AFTER THE TRANSFUSION  Right after receiving a blood transfusion, you will usually feel much better and more energetic. This is especially true if your red blood cells have gotten low (anemic). The transfusion raises the level of the red blood cells which carry oxygen, and this usually causes an energy increase.  The nurse administering the transfusion will monitor you carefully for complications. HOME CARE INSTRUCTIONS  No special instructions are needed after a transfusion. You may find your energy is better. Speak with your caregiver about any limitations on activity for underlying diseases you may have. SEEK MEDICAL CARE IF:   Your condition is not improving after your transfusion.  You develop redness or irritation at the intravenous (IV) site. SEEK IMMEDIATE MEDICAL CARE IF:  Any of the following symptoms occur over the next 12 hours:  Shaking chills.  You have a temperature by mouth above 102 F (38.9 C), not controlled by medicine.  Chest, back, or muscle pain.  People around you feel you are not acting correctly or are confused.  Shortness of breath  or difficulty breathing.  Dizziness and fainting.  You get a rash or develop hives.  You have a decrease in urine output.  Your urine turns a dark color or changes to pink, red, or brown. Any of the following symptoms occur over the next 10 days:  You have a temperature by mouth above 102 F (38.9 C), not controlled by medicine.  Shortness of breath.  Weakness after normal activity.  The white part of the eye turns yellow (jaundice).  You have a decrease in the amount of urine or are urinating less often.  Your urine turns a dark color or changes to pink, red, or brown. Document Released: 07/31/2000 Document Revised: 10/26/2011 Document Reviewed: 03/19/2008 [facility] Patient Information 2014  [place], [place].  _______________________________________________________________________

## 2016-07-07 ENCOUNTER — Encounter: Payer: Self-pay | Admitting: Interventional Radiology

## 2016-07-07 ENCOUNTER — Encounter (HOSPITAL_COMMUNITY)
Admission: RE | Admit: 2016-07-07 | Discharge: 2016-07-07 | Disposition: A | Discharge status: Home / Self Care | Payer: Medicare Other | Source: Ambulatory Visit | Attending: Interventional Radiology | Admitting: Interventional Radiology

## 2016-07-07 DIAGNOSIS — Z794 Long term (current) use of insulin: Secondary | ICD-10-CM | POA: Insufficient documentation

## 2016-07-07 DIAGNOSIS — E119 Type 2 diabetes mellitus without complications: Secondary | ICD-10-CM | POA: Insufficient documentation

## 2016-07-07 DIAGNOSIS — E079 Disorder of thyroid, unspecified: Secondary | ICD-10-CM | POA: Insufficient documentation

## 2016-07-07 DIAGNOSIS — Z01818 Encounter for other preprocedural examination: Secondary | ICD-10-CM | POA: Insufficient documentation

## 2016-07-07 DIAGNOSIS — N2889 Other specified disorders of kidney and ureter: Secondary | ICD-10-CM | POA: Insufficient documentation

## 2016-07-07 DIAGNOSIS — Z79899 Other long term (current) drug therapy: Secondary | ICD-10-CM | POA: Diagnosis not present

## 2016-07-07 LAB — GLUCOSE, CAPILLARY: Glucose-Capillary: 141 mg/dL — ABNORMAL HIGH (ref 65–99)

## 2016-07-07 LAB — BASIC METABOLIC PANEL
Anion gap: 7 (ref 5–15)
BUN: 18 mg/dL (ref 6–20)
CO2: 27 mmol/L (ref 22–32)
Calcium: 9 mg/dL (ref 8.9–10.3)
Chloride: 105 mmol/L (ref 101–111)
Creatinine, Ser: 0.83 mg/dL (ref 0.44–1.00)
GFR calc Af Amer: >60 mL/min (ref >60)
GFR calc non Af Amer: >60 mL/min (ref >60)
Glucose, Bld: 142 mg/dL — ABNORMAL HIGH (ref 65–99)
Potassium: 4 mmol/L (ref 3.5–5.1)
Sodium: 139 mmol/L (ref 135–145)

## 2016-07-07 LAB — CBC WITH DIFFERENTIAL/PLATELET
Basophils Absolute: 0 10*3/uL (ref 0.0–0.1)
Basophils Relative: 0 %
Eosinophils Absolute: 0.1 10*3/uL (ref 0.0–0.7)
Eosinophils Relative: 1 %
HCT: 37.5 % (ref 36.0–46.0)
Hemoglobin: 12.6 g/dL (ref 12.0–15.0)
Lymphocytes Relative: 40 %
Lymphs Abs: 2.5 10*3/uL (ref 0.7–4.0)
MCH: 31.8 pg (ref 26.0–34.0)
MCHC: 33.6 g/dL (ref 30.0–36.0)
MCV: 94.7 fL (ref 78.0–100.0)
Monocytes Absolute: 0.5 10*3/uL (ref 0.1–1.0)
Monocytes Relative: 7 %
Neutro Abs: 3.3 10*3/uL (ref 1.7–7.7)
Neutrophils Relative %: 52 %
Platelets: 273 10*3/uL (ref 150–400)
RBC: 3.96 MIL/uL (ref 3.87–5.11)
RDW: 13.5 % (ref 11.5–15.5)
WBC: 6.3 10*3/uL (ref 4.0–10.5)

## 2016-07-07 LAB — PROTIME-INR
INR: 1.14
Prothrombin Time: 14.7 seconds (ref 11.4–15.2)

## 2016-07-07 LAB — ABO/RH: ABO/RH(D): B POS

## 2016-07-07 NOTE — Progress Notes (Signed)
Consulted Anesthesia, Dr. Lyndle Herrlich about seeing a patient prior to their Radiology procedure on 07/15/2016 and per Dr. Lyndle Herrlich, Anesthesia will assess patient morning of procedure in Radiology!

## 2016-07-08 ENCOUNTER — Other Ambulatory Visit: Payer: Self-pay | Admitting: Radiology

## 2016-07-08 ENCOUNTER — Telehealth: Payer: Self-pay | Admitting: Neurology

## 2016-07-08 LAB — HEMOGLOBIN A1C
Hgb A1c MFr Bld: 9.7 % — ABNORMAL HIGH (ref 4.8–5.6)
Mean Plasma Glucose: 232 mg/dL

## 2016-07-08 NOTE — Telephone Encounter (Signed)
[facility] called asking for clearance for the pt to stop taking Plavix tomorrow for 5 days prior to procedure. Pt has a procedure for cryoablation for right renal mass. Pt will be admitted for overnight observation at [facility]. They will need a note in writing as well as in Epic in a note.  May fax to : [phone] Phone: [phone] Ardath Sax is asking for an answer today. She will be in the office until 4:30 or 5p.

## 2016-07-08 NOTE — Telephone Encounter (Signed)
Patient seems stable from neurovascular standpoint. She may hold Plavix for 5 days prior to the procedure and resume it after  When safe with a small but acceptable periprocedural risk of TIA/stroke if acceptable to the patient.

## 2016-07-08 NOTE — Telephone Encounter (Signed)
Rn call Henni at GI. PT needs a clearance to stop plavix. Pt will be having the procedure next Wednesday. GI will be closed thu and fri and the weekend. Henni stated the note can be attach to the phone note.

## 2016-07-14 ENCOUNTER — Other Ambulatory Visit: Payer: Self-pay | Admitting: General Surgery

## 2016-07-15 ENCOUNTER — Encounter (HOSPITAL_COMMUNITY): Payer: Self-pay | Admitting: *Deleted

## 2016-07-15 ENCOUNTER — Encounter (HOSPITAL_COMMUNITY): Payer: Self-pay

## 2016-07-15 ENCOUNTER — Ambulatory Visit (HOSPITAL_COMMUNITY): Payer: Medicare Other | Admitting: Certified Registered Nurse Anesthetist

## 2016-07-15 ENCOUNTER — Ambulatory Visit (HOSPITAL_COMMUNITY)
Admission: RE | Admit: 2016-07-15 | Discharge: 2016-07-15 | Disposition: A | Discharge status: Home / Self Care | Payer: Medicare Other | Source: Ambulatory Visit | Attending: Interventional Radiology | Admitting: Interventional Radiology

## 2016-07-15 ENCOUNTER — Observation Stay (HOSPITAL_COMMUNITY)
Admission: RE | Admit: 2016-07-15 | Discharge: 2016-07-16 | Disposition: A | Discharge status: Home / Self Care | Payer: Medicare Other | Source: Ambulatory Visit | Attending: Interventional Radiology | Admitting: Interventional Radiology

## 2016-07-15 ENCOUNTER — Encounter (HOSPITAL_COMMUNITY)
Admission: RE | Disposition: A | Discharge status: Home / Self Care | Payer: Self-pay | Source: Ambulatory Visit | Attending: Interventional Radiology

## 2016-07-15 DIAGNOSIS — Z7902 Long term (current) use of antithrombotics/antiplatelets: Secondary | ICD-10-CM | POA: Insufficient documentation

## 2016-07-15 DIAGNOSIS — N2889 Other specified disorders of kidney and ureter: Secondary | ICD-10-CM

## 2016-07-15 DIAGNOSIS — C649 Malignant neoplasm of unspecified kidney, except renal pelvis: Secondary | ICD-10-CM | POA: Diagnosis present

## 2016-07-15 DIAGNOSIS — I10 Essential (primary) hypertension: Secondary | ICD-10-CM | POA: Diagnosis not present

## 2016-07-15 DIAGNOSIS — Z794 Long term (current) use of insulin: Secondary | ICD-10-CM | POA: Diagnosis not present

## 2016-07-15 DIAGNOSIS — E890 Postprocedural hypothyroidism: Secondary | ICD-10-CM | POA: Insufficient documentation

## 2016-07-15 DIAGNOSIS — Z85850 Personal history of malignant neoplasm of thyroid: Secondary | ICD-10-CM | POA: Diagnosis not present

## 2016-07-15 DIAGNOSIS — Z6836 Body mass index (BMI) 36.0-36.9, adult: Secondary | ICD-10-CM | POA: Insufficient documentation

## 2016-07-15 DIAGNOSIS — I69354 Hemiplegia and hemiparesis following cerebral infarction affecting left non-dominant side: Secondary | ICD-10-CM | POA: Diagnosis not present

## 2016-07-15 DIAGNOSIS — E109 Type 1 diabetes mellitus without complications: Secondary | ICD-10-CM | POA: Insufficient documentation

## 2016-07-15 DIAGNOSIS — C641 Malignant neoplasm of right kidney, except renal pelvis: Principal | ICD-10-CM | POA: Insufficient documentation

## 2016-07-15 DIAGNOSIS — E669 Obesity, unspecified: Secondary | ICD-10-CM | POA: Diagnosis not present

## 2016-07-15 DIAGNOSIS — Z79899 Other long term (current) drug therapy: Secondary | ICD-10-CM | POA: Insufficient documentation

## 2016-07-15 LAB — TYPE AND SCREEN
ABO/RH(D): B POS
Antibody Screen: NEGATIVE

## 2016-07-15 LAB — GLUCOSE, CAPILLARY
Glucose-Capillary: 182 mg/dL — ABNORMAL HIGH (ref 65–99)
Glucose-Capillary: 194 mg/dL — ABNORMAL HIGH (ref 65–99)
Glucose-Capillary: 210 mg/dL — ABNORMAL HIGH (ref 65–99)

## 2016-07-15 SURGERY — RADIOLOGY WITH ANESTHESIA
Anesthesia: General | Laterality: Right

## 2016-07-15 MED ORDER — MEPERIDINE HCL 50 MG/ML IJ SOLN: 6.2500 mg | INTRAMUSCULAR | Status: DC | PRN

## 2016-07-15 MED ORDER — IOPAMIDOL (ISOVUE-300) INJECTION 61%
30.0000 mL | Freq: Once | INTRAVENOUS | Status: AC | PRN
Start: 2016-07-15 — End: 2016-07-15
  Administered 2016-07-15: 15 mL via INTRAVENOUS

## 2016-07-15 MED ORDER — IOPAMIDOL (ISOVUE-300) INJECTION 61%
INTRAVENOUS | Status: AC
  Filled 2016-07-15: qty 30

## 2016-07-15 MED ORDER — FENTANYL CITRATE (PF) 100 MCG/2ML IJ SOLN
INTRAMUSCULAR | Status: AC
  Filled 2016-07-15: qty 4

## 2016-07-15 MED ORDER — FENTANYL CITRATE (PF) 100 MCG/2ML IJ SOLN
INTRAMUSCULAR | Status: AC
  Filled 2016-07-15: qty 2

## 2016-07-15 MED ORDER — INSULIN ASPART 100 UNIT/ML ~~LOC~~ SOLN
0.0000 [IU] | Freq: Three times a day (TID) | SUBCUTANEOUS | Status: DC
  Administered 2016-07-16 (×2): 8 [IU] via SUBCUTANEOUS

## 2016-07-15 MED ORDER — HYDROCODONE-ACETAMINOPHEN 5-325 MG PO TABS
ORAL_TABLET | ORAL | Status: AC
  Administered 2016-07-15: 1 via ORAL
  Filled 2016-07-15: qty 1

## 2016-07-15 MED ORDER — SENNOSIDES-DOCUSATE SODIUM 8.6-50 MG PO TABS: 1.0000 | ORAL_TABLET | Freq: Every day | ORAL | Status: DC | PRN

## 2016-07-15 MED ORDER — PROPOFOL 10 MG/ML IV BOLUS
INTRAVENOUS | Status: DC | PRN
  Administered 2016-07-15: 130 mg via INTRAVENOUS

## 2016-07-15 MED ORDER — ONDANSETRON HCL 4 MG/2ML IJ SOLN
INTRAMUSCULAR | Status: DC | PRN
  Administered 2016-07-15: 4 mg via INTRAVENOUS

## 2016-07-15 MED ORDER — SODIUM CHLORIDE 0.9 % IV SOLN
INTRAVENOUS | Status: AC
  Filled 2016-07-15: qty 1000

## 2016-07-15 MED ORDER — HYDROMORPHONE HCL 1 MG/ML IJ SOLN
1.0000 mg | INTRAMUSCULAR | Status: DC | PRN
  Administered 2016-07-15 (×2): 1 mg via INTRAVENOUS
  Filled 2016-07-15: qty 1

## 2016-07-15 MED ORDER — METOCLOPRAMIDE HCL 5 MG/ML IJ SOLN: 10.0000 mg | Freq: Once | INTRAMUSCULAR | Status: DC | PRN

## 2016-07-15 MED ORDER — FENTANYL CITRATE (PF) 100 MCG/2ML IJ SOLN
INTRAMUSCULAR | Status: AC
  Administered 2016-07-15: 25 ug via INTRAVENOUS
  Filled 2016-07-15: qty 2

## 2016-07-15 MED ORDER — DOCUSATE SODIUM 100 MG PO CAPS
100.0000 mg | ORAL_CAPSULE | Freq: Two times a day (BID) | ORAL | Status: DC
  Administered 2016-07-15: 100 mg via ORAL
  Filled 2016-07-15 (×2): qty 1

## 2016-07-15 MED ORDER — LIDOCAINE 2% (20 MG/ML) 5 ML SYRINGE
INTRAMUSCULAR | Status: DC | PRN
  Administered 2016-07-15: 80 mg via INTRAVENOUS

## 2016-07-15 MED ORDER — PHENYLEPHRINE 40 MCG/ML (10ML) SYRINGE FOR IV PUSH (FOR BLOOD PRESSURE SUPPORT)
PREFILLED_SYRINGE | INTRAVENOUS | Status: DC | PRN
  Administered 2016-07-15 (×12): 80 ug via INTRAVENOUS
  Administered 2016-07-15 (×2): 40 ug via INTRAVENOUS
  Administered 2016-07-15: 80 ug via INTRAVENOUS

## 2016-07-15 MED ORDER — HYDROMORPHONE HCL 1 MG/ML IJ SOLN
INTRAMUSCULAR | Status: AC
  Administered 2016-07-15: 1 mg via INTRAVENOUS
  Filled 2016-07-15: qty 1

## 2016-07-15 MED ORDER — EPHEDRINE SULFATE-NACL 50-0.9 MG/10ML-% IV SOSY
PREFILLED_SYRINGE | INTRAVENOUS | Status: DC | PRN
  Administered 2016-07-15 (×2): 5 mg via INTRAVENOUS

## 2016-07-15 MED ORDER — ONDANSETRON HCL 4 MG/2ML IJ SOLN: 4.0000 mg | Freq: Four times a day (QID) | INTRAMUSCULAR | Status: DC | PRN

## 2016-07-15 MED ORDER — ROCURONIUM BROMIDE 10 MG/ML (PF) SYRINGE
PREFILLED_SYRINGE | INTRAVENOUS | Status: DC | PRN
  Administered 2016-07-15 (×2): 5 mg via INTRAVENOUS
  Administered 2016-07-15 (×2): 10 mg via INTRAVENOUS
  Administered 2016-07-15: 20 mg via INTRAVENOUS
  Administered 2016-07-15: 50 mg via INTRAVENOUS

## 2016-07-15 MED ORDER — FENTANYL CITRATE (PF) 100 MCG/2ML IJ SOLN
INTRAMUSCULAR | Status: DC | PRN
  Administered 2016-07-15: 100 ug via INTRAVENOUS

## 2016-07-15 MED ORDER — SUGAMMADEX SODIUM 200 MG/2ML IV SOLN
INTRAVENOUS | Status: DC | PRN
  Administered 2016-07-15: 300 mg via INTRAVENOUS

## 2016-07-15 MED ORDER — LACTATED RINGERS IV SOLN
INTRAVENOUS | Status: DC
  Administered 2016-07-15 (×2): via INTRAVENOUS

## 2016-07-15 MED ORDER — FENTANYL CITRATE (PF) 100 MCG/2ML IJ SOLN
25.0000 ug | INTRAMUSCULAR | Status: DC | PRN
  Administered 2016-07-15 (×4): 25 ug via INTRAVENOUS
  Administered 2016-07-15: 50 ug via INTRAVENOUS

## 2016-07-15 MED ORDER — HYDROCODONE-ACETAMINOPHEN 5-325 MG PO TABS
1.0000 | ORAL_TABLET | ORAL | Status: DC | PRN
  Administered 2016-07-15: 1 via ORAL

## 2016-07-15 MED ORDER — PROPOFOL 10 MG/ML IV BOLUS
INTRAVENOUS | Status: AC
  Filled 2016-07-15: qty 20

## 2016-07-15 MED ORDER — CEFAZOLIN SODIUM-DEXTROSE 2-4 GM/100ML-% IV SOLN
2.0000 g | INTRAVENOUS | Status: AC
  Administered 2016-07-15: 2 g via INTRAVENOUS
  Filled 2016-07-15 (×2): qty 100

## 2016-07-15 NOTE — H&P (Signed)
Chief Complaint: right renal mass  Referring Physician:Dr. Tommy Rainwater  Supervising Physician: Aletta Edouard  Patient Status: [facility] - Out-pt  HPI: Kaylee Mullins is an 80 y.o. female who was referred to Dr. Kathlene Cote secondary to a solid, enhancing mass noted in the upper pole of her right kidney in January 2016.  This was followed and on her MRI in September of 2017 it showed interval growth.  She also had progressive biliary and pancreatic ductal dilatation.  She had an ERCP with brushings that were negative.  She was referred to Dr. Kathlene Cote for evaluation for renal cryoablation and now presents today for this procedure.  She does take plavix secondary to a prior CVA and this has been held since 07-09-16.  She denies any change in her history since she saw Dr. Kathlene Cote just 2 weeks ago.  Past Medical History:  Past Medical History:  Diagnosis Date  . Arthritis    knees  . Black tarry stools    05-14-16 negative for occult blood with ER visit- noted in [place].  . Cancer [facility])    thyroid cancer- surgery and radiation  . Chronic kidney disease    questionable mass on kidney. Being followed by Dr Diona Fanti  . Complication of anesthesia    heart rate was really low  . Diabetes mellitus    type !  . Full dentures   . Hypertension   . Hypothyroidism   . Spinal stenosis   . Stroke [facility]) 09/2014   left sided weakness    Past Surgical History:  Past Surgical History:  Procedure Laterality Date  . ABDOMINAL HYSTERECTOMY     partial  . cataracts     Removed  11/2015  bilateral  . COLONOSCOPY W/ POLYPECTOMY    . ERCP N/A 05/07/2016   Procedure: ENDOSCOPIC RETROGRADE CHOLANGIOPANCREATOGRAPHY (ERCP);  Surgeon: Clarene Essex, MD;  Location: [facility] ENDOSCOPY;  Service: Endoscopy;  Laterality: N/A;  . EUS N/A 05/27/2016   Procedure: ESOPHAGEAL ENDOSCOPIC ULTRASOUND (EUS) RADIAL;  Surgeon: Arta Silence, MD;  Location: [facility] ENDOSCOPY;  Service: Endoscopy;  Laterality: N/A;  . FLEXIBLE  SIGMOIDOSCOPY  03/29/2012   Procedure: FLEXIBLE SIGMOIDOSCOPY;  Surgeon: Jeryl Columbia, MD;  Location: [facility] ENDOSCOPY;  Service: Endoscopy;  Laterality: N/A;  fleet enema upon arrival  . HOT HEMOSTASIS  03/29/2012   Procedure: HOT HEMOSTASIS (ARGON PLASMA COAGULATION/BICAP);  Surgeon: Jeryl Columbia, MD;  Location: [facility] ENDOSCOPY;  Service: Endoscopy;  Laterality: N/A;  . IR GENERIC HISTORICAL  06/30/2016   IR RADIOLOGIST EVAL & MGMT 06/30/2016 Aletta Edouard, MD [facility] INTERV RAD  . LUMBAR LAMINECTOMY/DECOMPRESSION MICRODISCECTOMY N/A 02/10/2016   Procedure: Lumbar three- four Laminectomy;  Surgeon: Kristeen Miss, MD;  Location: [facility] NEURO ORS;  Service: Neurosurgery;  Laterality: N/A;  L3-4 Laminectomy  . LUMBAR SPINE SURGERY     1st surgery "ray cage placed"  . THYROIDECTOMY  2005  . TONSILLECTOMY      Family History:  Family History  Problem Relation Age of Onset  . Heart failure Mother   . Heart attack Father     Social History:  reports that she has never smoked. She has never used smokeless tobacco. She reports that she does not drink alcohol or use drugs.  Allergies:  Allergies  Allergen Reactions  . Darvon [Propoxyphene Hcl] Other (See Comments)    hallucinations  . Nyquil Multi-Symptom [Pseudoeph-Doxylamine-Dm-Apap] Other (See Comments)    Makes pt not "feel right in her head"  . Metformin And Related Diarrhea  Medications: Medications reviewed in Epic  Please HPI for pertinent positives, otherwise complete 10 system ROS negative, with the exception of her chronic left-sided weakness from her prior CVA.  She walks with a cane.  Mallampati Score: MD Evaluation Airway: WNL Heart: WNL Abdomen: WNL Chest/ Lungs: WNL ASA  Classification: 3 Mallampati/Airway Score: Two  Physical Exam: Temp (F)   97.5  97.5 (36.4)  11/29 1020  Pulse Rate   60  60  11/29 1020  Resp   18  18  11/29 1020  BP   179/71  179/71  11/29 1020  SpO2 (%)   98  98  11/29 1020   General:  pleasant, obese, black female who is laying in bed in NAD HEENT: head is normocephalic, atraumatic.  Sclera are noninjected.  PERRL.  Ears and nose without any masses or lesions.  Mouth is pink and moist Heart: regular, rate, and rhythm.  Normal s1,s2. No obvious murmurs, gallops, or rubs noted.  Palpable radial and pedal pulses bilaterally Lungs: CTAB, no wheezes, rhonchi, or rales noted.  Respiratory effort nonlabored Abd: soft, NT, ND, +BS, no masses, hernias, or organomegaly Psych: A&Ox3 with an appropriate affect.   Labs: No results found for this or any previous visit (from the past 48 hour(s)).  Imaging: No results found.  Assessment/Plan 1. Right renal mass -we will plan to proceed with a renal biopsy and then right renal cryoablation. -her labs and vitals have been reviewed from her preop appointment -no change in her history since her appointment on 06-30-16. -Risks and Benefits discussed with the patient including, but not limited to failure to treat entire lesion, bleeding, infection, damage to adjacent structures, decrease in renal function or post procedural neuropathy. All of the patient's questions were answered, patient is agreeable to proceed. Consent signed and in chart.   Thank you for this interesting consult.  I greatly enjoyed meeting Kaylee Mullins and look forward to participating in their care.  A copy of this report was sent to the requesting provider on this date.  Electronically Signed: Henreitta Cea 07/15/2016, 12:12 PM   I spent a total of    25 Minutes in face to face in clinical consultation, greater than 50% of which was counseling/coordinating care for right renal mass

## 2016-07-15 NOTE — Anesthesia Postprocedure Evaluation (Signed)
Anesthesia Post Note  Patient: Kaylee Mullins  Procedure(s) Performed: Procedure(s) (LRB): right renal cryoablation and biopsy (Right)  Patient location during evaluation: PACU Anesthesia Type: General Level of consciousness: awake and alert and patient cooperative Pain management: pain level controlled Vital Signs Assessment: post-procedure vital signs reviewed and stable Respiratory status: spontaneous breathing and respiratory function stable Cardiovascular status: stable Anesthetic complications: no    Last Vitals:  Vitals:   07/15/16 1730 07/15/16 1745  BP: (!) 166/59 (!) 163/60  Pulse: (!) 57 (!) 58  Resp: 14 15  Temp:      Last Pain:  Vitals:   07/15/16 1745  PainSc: [place]

## 2016-07-15 NOTE — Anesthesia Preprocedure Evaluation (Addendum)
Anesthesia Evaluation  Patient identified by MRN, date of birth, ID band Patient awake    Reviewed: Allergy & Precautions, NPO status , Patient's Chart, lab work & pertinent test results  Airway Mallampati: II  TM Distance: >3 FB Neck ROM: Full    Dental no notable dental hx. (+) Edentulous Upper, Edentulous Lower   Pulmonary neg pulmonary ROS,    Pulmonary exam normal breath sounds clear to auscultation       Cardiovascular hypertension, Pt. on medications Normal cardiovascular exam Rhythm:Regular Rate:Normal     Neuro/Psych CVA, Residual Symptoms negative neurological ROS  negative psych ROS   GI/Hepatic negative GI ROS, Neg liver ROS,   Endo/Other  diabetes, Insulin Dependent  Renal/GU negative Renal ROS  negative genitourinary   Musculoskeletal negative musculoskeletal ROS (+)   Abdominal   Peds negative pediatric ROS (+)  Hematology negative hematology ROS (+)   Anesthesia Other Findings   Reproductive/Obstetrics negative OB ROS                            Anesthesia Physical Anesthesia Plan  ASA: II  Anesthesia Plan: General   Post-op Pain Management:    Induction: Intravenous  Airway Management Planned: Oral ETT  Additional Equipment:   Intra-op Plan:   Post-operative Plan: Extubation in OR  Informed Consent: I have reviewed the patients History and Physical, chart, labs and discussed the procedure including the risks, benefits and alternatives for the proposed anesthesia with the patient or authorized representative who has indicated his/her understanding and acceptance.   Dental advisory given  Plan Discussed with: CRNA  Anesthesia Plan Comments:         Anesthesia Quick Evaluation

## 2016-07-15 NOTE — Anesthesia Procedure Notes (Signed)
Procedure Name: Intubation Date/Time: 07/15/2016 12:52 PM Performed by: Montel Clock Pre-anesthesia Checklist: Patient identified, Emergency Drugs available, Suction available, Patient being monitored and Timeout performed Patient Re-evaluated:Patient Re-evaluated prior to inductionOxygen Delivery Method: Circle system utilized Preoxygenation: Pre-oxygenation with 100% oxygen Intubation Type: IV induction Ventilation: Mask ventilation with difficulty and Oral airway inserted - appropriate to patient size Laryngoscope Size: Mac and 3 Grade View: Grade I Tube type: Oral Tube size: 7.0 mm Number of attempts: 1 Airway Equipment and Method: Stylet Placement Confirmation: ETT inserted through vocal cords under direct vision,  positive ETCO2 and breath sounds checked- equal and bilateral Secured at: 21 cm Tube secured with: Tape Dental Injury: Teeth and Oropharynx as per pre-operative assessment  Comments: Unable to ventilate with 8OA, Switched to 9OA and able to ventilate with second hand to hold cheek to mask. Easy mask after few breaths.

## 2016-07-15 NOTE — Transfer of Care (Signed)
Immediate Anesthesia Transfer of Care Note  Patient: Kaylee Mullins  Procedure(s) Performed: Procedure(s): right renal cryoablation and biopsy (Right)  Patient Location: PACU  Anesthesia Type:General  Level of Consciousness: sedated  Airway & Oxygen Therapy: Patient Spontanous Breathing and Patient connected to face mask oxygen  Post-op Assessment: Report given to RN and Post -op Vital signs reviewed and stable  Post vital signs: Reviewed and stable  Last Vitals:  Vitals:   07/15/16 1020  BP: (!) 179/71  Pulse: 60  Resp: 18  Temp: 36.4 C    Last Pain:  Vitals:   07/15/16 1020  PainSc: 5       Patients Stated Pain Goal: 5 ([id number])  Complications: No apparent anesthesia complications

## 2016-07-15 NOTE — Procedures (Signed)
Interventional Radiology Procedure Note  Procedure:  CT guided biopsy and cryoablation of right renal mass  Anesthesia:  General  Complications: None  Estimated Blood Loss: < 10 mL  Findings:  Right renal mass sampled via 17 G needle with 18 G core biopsy x 1.  Hydrodissection performed with 200 mL of diluted saline/Isovue mixture injected via two [place] needles.  Cryoablation performed via 4 separate Galil Ice Rod Plus CX probes inserted into tumor.  2 separate freeze cycles performed.  Plan:  Overnight observation after PACU recovery.  Venetia Night. Kathlene Cote, M.D Pager:  [phone]

## 2016-07-16 ENCOUNTER — Other Ambulatory Visit: Payer: Self-pay | Admitting: Radiology

## 2016-07-16 ENCOUNTER — Encounter (HOSPITAL_COMMUNITY): Payer: Self-pay

## 2016-07-16 DIAGNOSIS — C641 Malignant neoplasm of right kidney, except renal pelvis: Secondary | ICD-10-CM | POA: Diagnosis not present

## 2016-07-16 DIAGNOSIS — N2889 Other specified disorders of kidney and ureter: Secondary | ICD-10-CM

## 2016-07-16 LAB — GLUCOSE, CAPILLARY
Glucose-Capillary: 242 mg/dL — ABNORMAL HIGH (ref 65–99)
Glucose-Capillary: 270 mg/dL — ABNORMAL HIGH (ref 65–99)
Glucose-Capillary: 268 mg/dL — ABNORMAL HIGH (ref 65–99)
Glucose-Capillary: 293 mg/dL — ABNORMAL HIGH (ref 65–99)

## 2016-07-16 LAB — BASIC METABOLIC PANEL
Anion gap: 5 (ref 5–15)
BUN: 20 mg/dL (ref 6–20)
CO2: 32 mmol/L (ref 22–32)
Calcium: 8.5 mg/dL — ABNORMAL LOW (ref 8.9–10.3)
Chloride: 103 mmol/L (ref 101–111)
Creatinine, Ser: 0.96 mg/dL (ref 0.44–1.00)
GFR calc Af Amer: >60 mL/min (ref >60)
GFR calc non Af Amer: 56 mL/min — ABNORMAL LOW (ref >60)
Glucose, Bld: 258 mg/dL — ABNORMAL HIGH (ref 65–99)
Potassium: 4.9 mmol/L (ref 3.5–5.1)
Sodium: 140 mmol/L (ref 135–145)

## 2016-07-16 LAB — CBC
HCT: 35.2 % — ABNORMAL LOW (ref 36.0–46.0)
Hemoglobin: 11.6 g/dL — ABNORMAL LOW (ref 12.0–15.0)
MCH: 32 pg (ref 26.0–34.0)
MCHC: 33 g/dL (ref 30.0–36.0)
MCV: 97 fL (ref 78.0–100.0)
Platelets: 280 10*3/uL (ref 150–400)
RBC: 3.63 MIL/uL — ABNORMAL LOW (ref 3.87–5.11)
RDW: 13.5 % (ref 11.5–15.5)
WBC: 8.4 10*3/uL (ref 4.0–10.5)

## 2016-07-16 NOTE — Progress Notes (Signed)
Discharge instructions reviewed with patient. Patient verbalized understanding. Patient discharged via private vehicle with son.

## 2016-07-16 NOTE — Progress Notes (Signed)
Inpatient Diabetes Program Recommendations  AACE/ADA: New Consensus Statement on Inpatient Glycemic Control (2015)  Target Ranges:  Prepandial:   less than 140 mg/dL      Peak postprandial:   less than 180 mg/dL (1-2 hours)      Critically ill patients:  140 - 180 mg/dL   Lab Results  Component Value Date   GLUCAP 268 (H) 07/16/2016   HGBA1C 9.7 (H) 07/07/2016    Review of Glycemic Control  Diabetes history: DM2 Outpatient Diabetes medications: Humalog 50/50 90 units in am and 60 units in pm Current orders for Inpatient glycemic control: Novolog 0-15 units tidwc  Blood sugars 210-270 mg/dL in past 12 hours. Needs portion of basal insulin.  Inpatient Diabetes Program Recommendations:    Add Levemir 15 units bid. Add Novolog 4 units tidwc if pt eats > 50% meal.  Will continue to follow. Thank you. Lorenda Peck, RD, LDN, CDE Inpatient Diabetes Coordinator [phone]

## 2016-07-16 NOTE — Discharge Instructions (Signed)
Cryoablation, Care After °This sheet gives you information about how to care for yourself after your procedure. Your health care provider may also give you more specific instructions. If you have problems or questions, contact your health care provider. °What can I expect after the procedure? °After the procedure, it is common to have: °· Soreness around the treatment area. °· Mild pain and swelling in the treatment area. °Follow these instructions at home: °Treatment area care  ° °· Follow instructions from your health care provider about how to take care of your incision. Make sure you: °¨ Wash your hands with soap and water before you change your bandage (dressing). If soap and water are not available, use hand sanitizer. °¨ Change your dressing as told by your health care provider. °¨ Leave stitches (sutures) in place. They may need to stay in place for 2 weeks or longer. °· Check your treatment area every day for signs of infection. Check for: °¨ More redness, swelling, or pain. °¨ More fluid or blood. °¨ Warmth. °¨ Pus or a bad smell. °· Keep the treated area clean, dry, and covered with a dressing until it has healed. Clean the area with soap and water or as told by your health care provider. °· You may shower if your health care provider approves. If your bandage gets wet, change it right away. °Activity  °· Follow instructions from your health care provider about any activity limitations. °· Do not drive for 24 hours if you received a medicine to help you relax (sedative). °General instructions  °· Take over-the-counter and prescription medicines only as told by your health care provider. °· Keep all follow-up visits as told by your health care provider. This is important. °Contact a health care provider if: °· You do not have a bowel movement for 2 days. °· You have nausea or vomiting. °· You have more redness, swelling, or pain around your treatment area. °· You have more fluid or blood coming from your  treatment area. °· Your treatment area feels warm to the touch. °· You have pus or a bad smell coming from your treatment area. °· You have a fever. °Get help right away if: °· You have severe pain. °· You have trouble swallowing or breathing. °· You have severe weakness or dizziness. °· You have chest pain or shortness of breath. °This information is not intended to replace advice given to you by your health care provider. Make sure you discuss any questions you have with your health care provider. °Document Released: 05/24/2013 Document Revised: 02/21/2016 Document Reviewed: 01/01/2016 °Elsevier Interactive Patient Education © 2017 Elsevier Inc. ° °

## 2016-07-16 NOTE — Discharge Summary (Signed)
Patient ID: Kaylee Mullins MRN: [id number] DOB/AGE: 1936-08-07 80 y.o.  Admit date: 07/15/2016 Discharge date: 07/16/2016  Supervising Physician: Aletta Edouard  Patient Status: [facility] - In-pt  Admission Diagnoses: Right renal mass  Discharge Diagnoses: Right renal mass, status post CT-guided core biopsy and cryoablation on 07/15/16 via general anesthesia Active Problems:   Right renal mass  Past Medical History:  Diagnosis Date  . Arthritis    knees  . Black tarry stools    05-14-16 negative for occult blood with ER visit- noted in [place].  . Cancer [facility])    thyroid cancer- surgery and radiation  . Chronic kidney disease    questionable mass on kidney. Being followed by Dr Diona Fanti  . Complication of anesthesia    heart rate was really low  . Diabetes mellitus    type !  . Full dentures   . Hypertension   . Hypothyroidism   . Spinal stenosis   . Stroke [facility]) 09/2014   left sided weakness   Past Surgical History:  Procedure Laterality Date  . ABDOMINAL HYSTERECTOMY     partial  . cataracts     Removed  11/2015  bilateral  . COLONOSCOPY W/ POLYPECTOMY    . ERCP N/A 05/07/2016   Procedure: ENDOSCOPIC RETROGRADE CHOLANGIOPANCREATOGRAPHY (ERCP);  Surgeon: Clarene Essex, MD;  Location: [facility];  Service: Endoscopy;  Laterality: N/A;  . EUS N/A 05/27/2016   Procedure: ESOPHAGEAL ENDOSCOPIC ULTRASOUND (EUS) RADIAL;  Surgeon: Arta Silence, MD;  Location: [facility] ENDOSCOPY;  Service: Endoscopy;  Laterality: N/A;  . FLEXIBLE SIGMOIDOSCOPY  03/29/2012   Procedure: FLEXIBLE SIGMOIDOSCOPY;  Surgeon: Jeryl Columbia, MD;  Location: [facility] ENDOSCOPY;  Service: Endoscopy;  Laterality: N/A;  fleet enema upon arrival  . HOT HEMOSTASIS  03/29/2012   Procedure: HOT HEMOSTASIS (ARGON PLASMA COAGULATION/BICAP);  Surgeon: Jeryl Columbia, MD;  Location: [facility] ENDOSCOPY;  Service: Endoscopy;  Laterality: N/A;  . IR GENERIC HISTORICAL  06/30/2016   IR RADIOLOGIST EVAL & MGMT 06/30/2016 Aletta Edouard, MD  [facility] INTERV RAD  . LUMBAR LAMINECTOMY/DECOMPRESSION MICRODISCECTOMY N/A 02/10/2016   Procedure: Lumbar three- four Laminectomy;  Surgeon: Kristeen Miss, MD;  Location: [facility] NEURO ORS;  Service: Neurosurgery;  Laterality: N/A;  L3-4 Laminectomy  . LUMBAR SPINE SURGERY     1st surgery "ray cage placed"  . THYROIDECTOMY  2005  . TONSILLECTOMY        Condition: good  Hospital Course:  Kaylee Mullins is an 80 y.o. female who was referred to Dr. Kathlene Cote secondary to a solid, enhancing mass noted in the upper pole of her right kidney in January 2016.  This was followed and on her MRI in September of 2017 it showed interval growth.  She also had progressive biliary and pancreatic ductal dilatation.  She had an ERCP with brushings that were negative.  She was referred to Dr. Kathlene Cote for evaluation for renal cryoablation and deemed an appropriate candidate.  She does take plavix secondary to a prior CVA and this has been held since 07-09-16. On 07/15/16 she underwent CT-guided core biopsy and cryoablation of the right renal mass via general anesthesia. The procedure was performed without immediate complications and she was admitted for overnight observation. Post procedure she did well without any major complaints. She did have some mild right flank discomfort during her stay but this did not worsen. She was able to eat, ambulate with walker assistance and void without difficulty. There was no hematuria. Follow-up laboratory studies revealed normal creatinine, WBC 8.4  and hemoglobin 11.6. Above was discussed with Dr. Kathlene Cote and patient was deemed appropriate for discharge at this time. She will follow-up with Dr. Kathlene Cote in the [place] clinic in 4 weeks. She will continue on her current home medications and may resume Plavix tomorrow. She was told to contact our service with any additional questions or concerns. Consults: none  Significant Diagnostic Studies:  Results for orders placed or performed during the  hospital encounter of 07/15/16  Glucose, capillary  Result Value Ref Range   Glucose-Capillary 182 (H) 65 - 99 mg/dL   Comment 1 Notify RN   Glucose, capillary  Result Value Ref Range   Glucose-Capillary 194 (H) 65 - 99 mg/dL   Comment 1 Document in Chart   Basic metabolic panel  Result Value Ref Range   Sodium 140 135 - 145 mmol/L   Potassium 4.9 3.5 - 5.1 mmol/L   Chloride 103 101 - 111 mmol/L   CO2 32 22 - 32 mmol/L   Glucose, Bld 258 (H) 65 - 99 mg/dL   BUN 20 6 - 20 mg/dL   Creatinine, Ser 0.96 0.44 - 1.00 mg/dL   Calcium 8.5 (L) 8.9 - 10.3 mg/dL   GFR calc non Af Amer 56 (L) >60 mL/min   GFR calc Af Amer >60 >60 mL/min   Anion gap 5 5 - 15  CBC  Result Value Ref Range   WBC 8.4 4.0 - 10.5 K/uL   RBC 3.63 (L) 3.87 - 5.11 MIL/uL   Hemoglobin 11.6 (L) 12.0 - 15.0 g/dL   HCT 35.2 (L) 36.0 - 46.0 %   MCV 97.0 78.0 - 100.0 fL   MCH 32.0 26.0 - 34.0 pg   MCHC 33.0 30.0 - 36.0 g/dL   RDW 13.5 11.5 - 15.5 %   Platelets 280 150 - 400 K/uL  Glucose, capillary  Result Value Ref Range   Glucose-Capillary 210 (H) 65 - 99 mg/dL   Comment 1 Notify RN   Glucose, capillary  Result Value Ref Range   Glucose-Capillary 242 (H) 65 - 99 mg/dL   Comment 1 Notify RN   Glucose, capillary  Result Value Ref Range   Glucose-Capillary 270 (H) 65 - 99 mg/dL   Comment 1 Notify RN   Glucose, capillary  Result Value Ref Range   Glucose-Capillary 268 (H) 65 - 99 mg/dL   Comment 1 Notify RN   Glucose, capillary  Result Value Ref Range   Glucose-Capillary 293 (H) 65 - 99 mg/dL   Comment 1 Notify RN      Treatments: CT-guided core biopsy and cryoablation of right renal mass via general anesthesia on 07/15/16  Discharge Exam: Blood pressure 137/73, pulse 72, temperature 99.2 F (37.3 C), temperature source Oral, resp. rate 16, height 5\' 4"  (1.626 m), SpO2 98 %. Patient awake, alert. Chest clear to auscultation bilaterally. Heart with regular rate and rhythm. Abdomen soft, obese, positive  bowel sounds, nontender. Puncture site right flank with mild tenderness to palpation. No obvious hematoma.  Disposition: home  Discharge Instructions    Call MD for:  difficulty breathing, headache or visual disturbances    Complete by:  As directed    Call MD for:  extreme fatigue    Complete by:  As directed    Call MD for:  hives    Complete by:  As directed    Call MD for:  persistant dizziness or light-headedness    Complete by:  As directed    Call MD for:  persistant nausea and vomiting    Complete by:  As directed    Call MD for:  redness, tenderness, or signs of infection (pain, swelling, redness, odor or green/yellow discharge around incision site)    Complete by:  As directed    Call MD for:  severe uncontrolled pain    Complete by:  As directed    Call MD for:  temperature >100.4    Complete by:  As directed    Change dressing (specify)    Complete by:  As directed    May change bandage over right flank daily for next 2-3 days; may wash site with soap and water   Diet - low sodium heart healthy    Complete by:  As directed    Discharge instructions    Complete by:  As directed    May resume plavix on 12/1   Increase activity slowly    Complete by:  As directed    Lifting restrictions    Complete by:  As directed    No heavy lifting for 3-4 days   May shower / Bathe    Complete by:  As directed    Walker     Complete by:  As directed    Use walker to assist with ambulation     Current Meds  Medication Sig  . acetaminophen (TYLENOL) 325 MG tablet Take 325 mg by mouth daily as needed (for muscle stiffness).  . clopidogrel (PLAVIX) 75 MG tablet Take 1 tablet (75 mg total) by mouth daily. Resume on Sunday please (Patient taking differently: Take 75 mg by mouth daily. )  . cyanocobalamin 1000 MCG tablet Take 1,000 mcg by mouth daily.  . furosemide (LASIX) 40 MG tablet TAKE 1 TABLET BY MOUTH EVERY DAY AS NEEDED FOR LEG SWELLING  . HUMALOG MIX 50/50 KWIKPEN (50-50)  100 UNIT/ML Kwikpen INJECT 90 UNITS BEFORE BREAKFAST AND 60 UNITS BEFORE EVENING MEAL  . levothyroxine (SYNTHROID, LEVOTHROID) 200 MCG tablet Take 200 mcg by mouth daily before breakfast. For hypothyroidism  . metoprolol succinate (TOPROL-XL) 100 MG 24 hr tablet Take 100 mg by mouth daily. Take with or immediately following a meal.   . Naphazoline-Glycerin-Zinc Sulf (CLEAR EYES MAXIMUM ITCHY EYE) 0.012-0.25-0.25 % SOLN Place 1-2 drops into both eyes daily as needed (for itchy eyes).  . rosuvastatin (CRESTOR) 20 MG tablet Take 20 mg by mouth daily.   Marland Kitchen telmisartan-hydrochlorothiazide (MICARDIS HCT) 80-25 MG tablet Take 1 tablet by mouth daily.   . [DISCONTINUED] hydrOXYzine (ATARAX/VISTARIL) 25 MG tablet Take 25 mg by mouth every 6 (six) hours as needed for itching.      Follow-up Information    YAMAGATA,GLENN T, MD Follow up.   Specialty:  Interventional Radiology Why:  Radiology will call you with follow up appointment with Dr. Kathlene Cote in the [place] clinic in 4 weeks; call [phone] or [phone] with any questions Contact information: [address] [place] [address] [phone]        Jorja Loa, MD Follow up.   Specialty:  Urology Why:  Continue care with Dr. Diona Fanti  as scheduled Contact information: [place] [address] [phone]            Electronically Signed: D. Rowe Robert 07/16/2016, 3:17 PM   I have spent less than 30 minutes discharging Kaylee Mullins.

## 2016-07-21 ENCOUNTER — Ambulatory Visit: Payer: Self-pay | Admitting: Neurology

## 2016-08-19 ENCOUNTER — Other Ambulatory Visit: Payer: Self-pay | Admitting: Gastroenterology

## 2016-08-19 DIAGNOSIS — R748 Abnormal levels of other serum enzymes: Secondary | ICD-10-CM

## 2016-08-19 DIAGNOSIS — R198 Other specified symptoms and signs involving the digestive system and abdomen: Secondary | ICD-10-CM

## 2016-08-19 DIAGNOSIS — N2889 Other specified disorders of kidney and ureter: Secondary | ICD-10-CM

## 2016-08-19 DIAGNOSIS — R634 Abnormal weight loss: Secondary | ICD-10-CM

## 2016-08-25 ENCOUNTER — Other Ambulatory Visit: Payer: Medicare Other

## 2016-08-27 ENCOUNTER — Other Ambulatory Visit: Payer: Medicare Other

## 2016-09-04 ENCOUNTER — Telehealth: Payer: Self-pay

## 2016-09-09 ENCOUNTER — Encounter: Payer: Self-pay | Admitting: Radiology

## 2016-09-09 ENCOUNTER — Ambulatory Visit
Admission: RE | Admit: 2016-09-09 | Discharge: 2016-09-09 | Disposition: A | Discharge status: Home / Self Care | Payer: Medicare Other | Source: Ambulatory Visit | Attending: Radiology | Admitting: Radiology

## 2016-09-09 ENCOUNTER — Ambulatory Visit
Admission: RE | Admit: 2016-09-09 | Discharge: 2016-09-09 | Disposition: A | Discharge status: Home / Self Care | Payer: Medicare Other | Source: Ambulatory Visit | Attending: Gastroenterology | Admitting: Gastroenterology

## 2016-09-09 DIAGNOSIS — N2889 Other specified disorders of kidney and ureter: Secondary | ICD-10-CM

## 2016-09-09 DIAGNOSIS — R198 Other specified symptoms and signs involving the digestive system and abdomen: Secondary | ICD-10-CM

## 2016-09-09 DIAGNOSIS — R748 Abnormal levels of other serum enzymes: Secondary | ICD-10-CM

## 2016-09-09 DIAGNOSIS — R634 Abnormal weight loss: Secondary | ICD-10-CM

## 2016-09-09 HISTORY — PX: IR GENERIC HISTORICAL: IMG1180011

## 2016-09-09 MED ORDER — GADOBENATE DIMEGLUMINE 529 MG/ML IV SOLN
19.0000 mL | Freq: Once | INTRAVENOUS | Status: AC | PRN
  Administered 2016-09-09: 19 mL via INTRAVENOUS

## 2016-09-09 NOTE — Progress Notes (Signed)
Chief Complaint: Patient was seen in consultation today for  Chief Complaint  Patient presents with  . Follow-up    1 mo follow up Cryoablation of Right Renal     Referring Physician(s): Nolon Rod  Supervising Physician: Aletta Edouard  History of Present Illness: Kaylee Mullins is a 81 y.o. female who was referred to Dr. Kathlene Cote secondary to a solid, enhancing mass noted in the upper pole of her right kidney in January 2016.    This was followed and on her MRI in September of 2017 it showed interval growth.    She was referred to Dr. Kathlene Cote and underwent a right renal biopsy and cryoablation on 07/15/2016  She is here today for follow up.    MRI done today shows no complications status post cryo ablation of right upper pole renal neoplasm. No findings to suggest residual viable tumor at this Time.  Her only complaint is some back pain and loss of anal sphincter control.  She states this has gotten so bad the she almost never leaves her home. She states she now has to wear a pad if she goes out.  She denies any leg symptoms.  She is otherwise doing well.  Past Medical History:  Diagnosis Date  . Arthritis    knees  . Black tarry stools    05-14-16 negative for occult blood with ER visit- noted in [place].  . Cancer [facility])    thyroid cancer- surgery and radiation  . Chronic kidney disease    questionable mass on kidney. Being followed by Dr Diona Fanti  . Complication of anesthesia    heart rate was really low  . Diabetes mellitus    type !  . Full dentures   . Hypertension   . Hypothyroidism   . Spinal stenosis   . Stroke [facility]) 09/2014   left sided weakness    Past Surgical History:  Procedure Laterality Date  . ABDOMINAL HYSTERECTOMY     partial  . cataracts     Removed  11/2015  bilateral  . COLONOSCOPY W/ POLYPECTOMY    . ERCP N/A 05/07/2016   Procedure: ENDOSCOPIC RETROGRADE CHOLANGIOPANCREATOGRAPHY (ERCP);  Surgeon: Clarene Essex, MD;   Location: [facility] ENDOSCOPY;  Service: Endoscopy;  Laterality: N/A;  . EUS N/A 05/27/2016   Procedure: ESOPHAGEAL ENDOSCOPIC ULTRASOUND (EUS) RADIAL;  Surgeon: Arta Silence, MD;  Location: [facility] ENDOSCOPY;  Service: Endoscopy;  Laterality: N/A;  . FLEXIBLE SIGMOIDOSCOPY  03/29/2012   Procedure: FLEXIBLE SIGMOIDOSCOPY;  Surgeon: Jeryl Columbia, MD;  Location: [facility] ENDOSCOPY;  Service: Endoscopy;  Laterality: N/A;  fleet enema upon arrival  . HOT HEMOSTASIS  03/29/2012   Procedure: HOT HEMOSTASIS (ARGON PLASMA COAGULATION/BICAP);  Surgeon: Jeryl Columbia, MD;  Location: [facility] ENDOSCOPY;  Service: Endoscopy;  Laterality: N/A;  . IR GENERIC HISTORICAL  06/30/2016   IR RADIOLOGIST EVAL & MGMT 06/30/2016 Aletta Edouard, MD [facility] INTERV RAD  . LUMBAR LAMINECTOMY/DECOMPRESSION MICRODISCECTOMY N/A 02/10/2016   Procedure: Lumbar three- four Laminectomy;  Surgeon: Kristeen Miss, MD;  Location: [facility] NEURO ORS;  Service: Neurosurgery;  Laterality: N/A;  L3-4 Laminectomy  . LUMBAR SPINE SURGERY     1st surgery "ray cage placed"  . THYROIDECTOMY  2005  . TONSILLECTOMY      Allergies: Darvon [propoxyphene hcl]; Nyquil multi-symptom [pseudoeph-doxylamine-dm-apap]; and Metformin and related  Medications: Prior to Admission medications   Medication Sig Start Date End Date Taking? Authorizing Provider  acetaminophen (TYLENOL) 325 MG tablet Take 325 mg by mouth daily as  needed (for muscle stiffness).    Historical Provider, MD  clopidogrel (PLAVIX) 75 MG tablet Take 1 tablet (75 mg total) by mouth daily. Resume on Sunday please Patient taking differently: Take 75 mg by mouth daily.  05/07/16   Clarene Essex, MD  cyanocobalamin 1000 MCG tablet Take 1,000 mcg by mouth daily.    Historical Provider, MD  furosemide (LASIX) 40 MG tablet TAKE 1 TABLET BY MOUTH EVERY DAY AS NEEDED FOR LEG SWELLING 05/28/15   Historical Provider, MD  HUMALOG MIX 50/50 KWIKPEN (50-50) 100 UNIT/ML Kwikpen INJECT 90 UNITS BEFORE BREAKFAST AND 60 UNITS BEFORE  EVENING MEAL 12/03/15   Historical Provider, MD  levothyroxine (SYNTHROID, LEVOTHROID) 200 MCG tablet Take 200 mcg by mouth daily before breakfast. For hypothyroidism 11/11/15   Historical Provider, MD  metoprolol succinate (TOPROL-XL) 100 MG 24 hr tablet Take 100 mg by mouth daily. Take with or immediately following a meal.     Historical Provider, MD  Naphazoline-Glycerin-Zinc Sulf (CLEAR EYES MAXIMUM ITCHY EYE) 0.012-0.25-0.25 % SOLN Place 1-2 drops into both eyes daily as needed (for itchy eyes).    Historical Provider, MD  oxyCODONE-acetaminophen (PERCOCET/ROXICET) 5-325 MG tablet Take 1-2 tablets by mouth every 4 (four) hours as needed for moderate pain. 02/14/16   Kristeen Miss, MD  rosuvastatin (CRESTOR) 20 MG tablet Take 20 mg by mouth daily.  05/02/16   Historical Provider, MD  telmisartan-hydrochlorothiazide (MICARDIS HCT) 80-25 MG tablet Take 1 tablet by mouth daily.  05/01/16   Historical Provider, MD     Family History  Problem Relation Age of Onset  . Heart failure Mother   . Heart attack Father     Social History   Social History  . Marital status: Widowed    Spouse name: N/A  . Number of children: N/A  . Years of education: N/A   Occupational History  . retired     Buyer, retail   Social History Main Topics  . Smoking status: Never Smoker  . Smokeless tobacco: Never Used  . Alcohol use No  . Drug use: No  . Sexual activity: No   Other Topics Concern  . Not on file   Social History Narrative   Separated, retired, 3 children living, 2 children deceased   Caffeine use - soda every few days   Right handed   Pt lives alone.  Using cane when out and about.     Review of Systems: A 12 point ROS discussed  Review of Systems  Constitutional: Negative.   HENT: Negative.   Respiratory: Negative.   Cardiovascular: Negative.   Gastrointestinal: Negative.   Musculoskeletal: Positive for back pain and gait problem.  Skin: Negative.   Neurological:       Loss  of Sphincter tone.     Vital Signs: BP (!) 185/91 (BP Location: Left Arm, Patient Position: Sitting, Cuff Size: Normal)   Pulse (!) 108   Temp 98.1 F (36.7 C) (Oral)   Resp 16   Ht 5\' 4"  (1.626 m)   Wt 200 lb (90.7 kg)   SpO2 99%   BMI 34.33 kg/m   Physical Exam  Constitutional: She is oriented to person, place, and time. She appears well-developed and well-nourished.  HENT:  Head: Normocephalic and atraumatic.  Eyes: EOM are normal.  Neck: Normal range of motion.  Cardiovascular: Normal rate, regular rhythm and normal heart sounds.   Pulmonary/Chest: Effort normal and breath sounds normal. No respiratory distress.  Abdominal: Soft. She exhibits no distension. There is  no tenderness.  Musculoskeletal:  Walks with a cane  Neurological: She is alert and oriented to person, place, and time.  Skin: Skin is warm and dry.  Psychiatric: She has a normal mood and affect. Her behavior is normal. Judgment and thought content normal.     Imaging: Mr Abdomen W Wo Contrast  Result Date: 09/09/2016 CLINICAL DATA:  Abdominal pain and bloating. EXAM: MRI ABDOMEN WITHOUT AND WITH CONTRAST TECHNIQUE: Multiplanar multisequence MR imaging of the abdomen was performed both before and after the administration of intravenous contrast. CONTRAST:  27mL MULTIHANCE GADOBENATE DIMEGLUMINE 529 MG/ML IV SOLN COMPARISON:  12/17/2014 FINDINGS: Lower chest: There is no pleural fluid identified. Hepatobiliary: No mass or other parenchymal abnormality identified. Marked intrahepatic bile duct dilatation identified. The common bile duct measures 1.3 cm, image 25 of series 10. There is sludge identified within the dependent portion of the gallbladder. No gallbladder wall thickening or pericholecystic stone within the mid common bile duct measures 1 cm, image 19 of series 4. At the level of the distal common bile duct there is an abrupt cut off of the common bile duct a, image number 19 of series 4. Pancreas:  Pancreatic duct dilatation is again identified. This measures up to 11 mm. Extensive side branch duct dilatation in cystic change within the tail of pancreas noted. There is also abrupt cough of the pancreatic duct at the same level as the common bile duct termination. In this area there is focal enhancing soft tissue which measures approximately 1.9 x 2.4 cm. Multi lobular cystic lesion within head of pancreas is again identified measuring 2.3 x 1.8 cm, image 30 of series 10. Unchanged from previous exam. Spleen:  The spleen appears normal. Adrenals/Urinary Tract: Normal appearance of the adrenal glands. The left kidney is normal. No mass or hydronephrosis. Cryo ablation site within the upper pole of the right kidney is identified. This measures 3.9 by 2.7 cm, image 66 of series 13. (This studies ir says the first baseline post cryo ablation study of the known right renal cell carcinoma.) no definite areas of enhancement within this lesion to suggest residual viable tumor. No perinephric fluid collections identified. No hydronephrosis. Stomach/Bowel: The stomach is within normal limits. The small bowel loops have a normal course and caliber. No obstruction. Normal appearance of the colon. Vascular/Lymphatic: Aortic atherosclerosis. No aneurysm. No upper abdominal adenopathy. Other:  No ascites or fluid collections. Musculoskeletal: No abnormal signal identified within the bone marrow. IMPRESSION: 1. Examination is positive for recurrent biliary and pancreatic duct dilatation. No stent is in place at this time. Although no definitive masses identified in the cul lesion within the head of pancreas or ampulla cannot be excluded. Alternatively findings may reflect sequelae of chronic inflammation with subsequent ductal stricture. 2. Similar size of cystic lesion within the head of pancreas. 3. No complications status post cryo ablation of right upper pole renal neoplasm. No findings to suggest residual viable tumor at  this time. 4. No upper abdominal adenopathy or suspicious enhancing liver lesions identified suggestive of metastases. 5. Common bile duct stone. Electronically Signed   By: Kerby Moors M.D.   On: 09/09/2016 11:11    Labs:  CBC:  Recent Labs  02/14/16 0631 05/14/16 1258 07/07/16 1359 07/16/16 0409  WBC 11.0* 7.8 6.3 8.4  HGB 11.5* 12.5 12.6 11.6*  HCT 35.1* 36.3 37.5 35.2*  PLT 242 310 273 280    COAGS:  Recent Labs  07/07/16 1359  INR 1.14    BMP:  Recent Labs  02/13/16 0228 05/14/16 1258 07/07/16 1359 07/16/16 0409  NA 138 131* 139 140  K 3.5 3.4* 4.0 4.9  CL 100* 96* 105 103  CO2 27 23 27  32  GLUCOSE 141* 417* 142* 258*  BUN 13 29* 18 20  CALCIUM 8.6* 8.0* 9.0 8.5*  CREATININE 0.93 1.18* 0.83 0.96  GFRNONAA 58* 43* >60 56*  GFRAA >60 50* >60 >60    LIVER FUNCTION TESTS:  Recent Labs  02/13/16 0228 02/14/16 0631 05/14/16 1258 05/18/16 1220  BILITOT 1.3* 1.0 4.0* 2.6*  AST 137* 85* 89* 155*  ALT 93* 76* 87* 106*  ALKPHOS 92 101 329* 545*  PROT 6.8 6.4* 7.7 7.2  ALBUMIN 3.2* 2.8* 3.0* 2.7*    TUMOR MARKERS: No results for input(s): AFPTM, CEA, CA199, CHROMGRNA in the last 8760 hours.  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 Kathlene Cote on 0000000  MRI = No complications status post cryo ablation of right upper pole renal neoplasm. No findings to suggest residual viable tumor at this Time.  Return in 1 year with repeat CT scan  See Dr. Ellene Route for evaluation of back pain with incontinence of bowel.  Electronically Signed: Murrell Redden PA-C 09/09/2016, 12:04 PM   Please refer to Dr. Margaretmary Dys attestation of this note for management and plan.

## 2016-09-14 ENCOUNTER — Other Ambulatory Visit: Payer: Medicare Other

## 2016-09-16 ENCOUNTER — Encounter: Payer: Self-pay | Admitting: Neurology

## 2016-09-16 ENCOUNTER — Ambulatory Visit (INDEPENDENT_AMBULATORY_CARE_PROVIDER_SITE_OTHER): Payer: Medicare Other | Admitting: Neurology

## 2016-09-16 VITALS — BP 181/84 | HR 71 | Wt 204.6 lb

## 2016-09-16 DIAGNOSIS — I6529 Occlusion and stenosis of unspecified carotid artery: Secondary | ICD-10-CM

## 2016-09-16 DIAGNOSIS — I69359 Hemiplegia and hemiparesis following cerebral infarction affecting unspecified side: Secondary | ICD-10-CM

## 2016-09-16 NOTE — Progress Notes (Signed)
PATIENT: Kaylee Mullins DOB: 1936/04/10  REASON FOR VISIT: follow up- history of cva HISTORY FROM: patient  HISTORY OF PRESENT ILLNESS: Kaylee Mullins is a 81 year old female with a history of stroke. She returns today for follow-up. She continues on Plavix and is tolerating it well. The patient's blood pressure is elevated today. She states that she took all of her medication except her Micardis. Her primary care continues to manage her cholesterol and diabetes. She is unsure what her recent hemoglobin A1c was. She states that she continues to do exercises at home to improve her mobility of the left arm. She states that she continues have some mild sensory changes in the left hand but it does not hinder her from using it. She states that she had spinal surgery in 1997 for stenosis. She states that she's been having recurrent back pain and followed up with Dr. Ellene Route. He advised that he may need to do some repair work- she has an MRI scheduled and will probably be undergoing surgery after the MRI. Patient advised that Plavix will need to be stopped prior to surgery. She returns today for an evaluation.  HISTORY 06/17/15 (MM): Kaylee Mullins is a 81 year old female with a history of stroke on Jerry 26 2016. She returns today for follow-up. She continues on Plavix for stroke prevention. She is tolerating this well. Patient's blood pressure has remained under relatively good control. She is on Crestor to manage her cholesterol. She is also on insulin for her diabetes. She reports that her latest hemoglobin A1c was 7% She denies any additional strokelike symptoms. She states that she has been doing her own exercises at home. She feels that the weakness that she did have on the left side has improved. She continues to have some sensory deficits in the left hand but states that It has improved from her initial symptoms. She states that she is monitoring her diet however she does like to eat. She denies any  new symptoms. She returns today for an evaluation.  HISTORY 12/13/14 (Kaylee Mullins):[name] seen for first office follow-up visit today for hospital admission for stroke on 09/11/14. She presented with left-sided weakness and numbness and elevated blood pressure. She complained of dizziness and tingling as well.Gait was unsteady. She presented beyond time window for TPA. Initial CT scan of the head showed no acute findings. Subsequent MRI scan of the brain showed a right thalamic lacunar acute infarct. MRA of the brain showed no large vessel stenosis and carotid ultrasound was unremarkable. Total cholesterol 149 triglycerides 131 and LDL 73 mg percent. Hemoglobin A1c was elevated at 8.9. Urine drug screen was negative. Transthoracic echo showed normal ejection fraction. Carotid ultrasound showed no stenosis. She had mild left-sided weakness and left hemisensory loss which improved shortly after admission. She was started on Plavix for stroke prevention and blood pressure medications were increased for better control. She was found to have a incidental renal mass with plan for removal soon. She states she's done well since discharge she still has intermittent mild burning in the left hand but her strength on the left side is much better. She is currently doing outpatient therapy which is temporarily on hold due to elevated blood pressure. She has seen her endocrinologist Dr. Buddy Duty who has increase her Humulin fasting sugars yet range in the 150-160 range. She has had not yet had kidney biopsy but the plan is to repeat her renal scan on May 2 and to the biopsy only if  the lesion is increasing in size. She is tolerating Plavix well without bleeding or bruising. She complains of mild posterior neck pain on the left as well as flank pain. Update 09/16/2016 ; she returns for follow-up after last visit 6 months ago. She states she is doing well from neurovascular standpoint without recurrent stroke or TIA  symptoms now for more than 2 years. She however this has had multiple medical issues. She underwent endoscopy twice for obstructed bile ducts by Dr. Fatima Sanger. She is having some diarrhea now and thinks she may have inflammatory bowel disease. She also underwent interventional treatment for right renal mass by Dr. Kathlene Cote. She has not had any recurrent stroke. Symptoms. She remains on Plavix which is tolerating well without bleeding or bruising. She states her blood pressure has been difficult to control him today it is 181/84. Her fasting sugars are doing much better and last hemoglobin A1c was 7.3. She is tolerating Crestor well without side effects but cannot tell me when she had lipid profile last checked. She's not had carotid ultrasound done for more than 2 years. She does walk with a cane but of late has not been walking a lot due to her medical illnesses. REVIEW OF SYSTEMS: Out of a complete 14 system review of symptoms, the patient complains only of the following symptoms, and all other reviewed systems are negative.  Appetite change excessive sweating ringing in the ears abdominal pain: Intolerance diarrhea, frequency of urination, memory loss, depression, frequently waking and difficulty walking all other systems negative  ALLERGIES: Allergies  Allergen Reactions  . Darvon [Propoxyphene Hcl] Other (See Comments)    hallucinations  . Nyquil Multi-Symptom [Pseudoeph-Doxylamine-Dm-Apap] Other (See Comments)    Makes pt not "feel right in her head"  . Metformin And Related Diarrhea    HOME MEDICATIONS: Outpatient Medications Prior to Visit  Medication Sig Dispense Refill  . acetaminophen (TYLENOL) 325 MG tablet Take 325 mg by mouth daily as needed (for muscle stiffness).    . clopidogrel (PLAVIX) 75 MG tablet Take 1 tablet (75 mg total) by mouth daily. Resume on Sunday please (Patient taking differently: Take 75 mg by mouth daily. ) 30 tablet 1  . cyanocobalamin 1000 MCG tablet Take 1,000  mcg by mouth daily.    . furosemide (LASIX) 40 MG tablet TAKE 1 TABLET BY MOUTH EVERY DAY AS NEEDED FOR LEG SWELLING  5  . HUMALOG MIX 50/50 KWIKPEN (50-50) 100 UNIT/ML Kwikpen INJECT 90 UNITS BEFORE BREAKFAST AND 60 UNITS BEFORE EVENING MEAL  11  . levothyroxine (SYNTHROID, LEVOTHROID) 200 MCG tablet Take 200 mcg by mouth daily before breakfast. For hypothyroidism  99  . metoprolol succinate (TOPROL-XL) 100 MG 24 hr tablet Take 100 mg by mouth daily. Take with or immediately following a meal.     . Naphazoline-Glycerin-Zinc Sulf (CLEAR EYES MAXIMUM ITCHY EYE) 0.012-0.25-0.25 % SOLN Place 1-2 drops into both eyes daily as needed (for itchy eyes).    Marland Kitchen oxyCODONE-acetaminophen (PERCOCET/ROXICET) 5-325 MG tablet Take 1-2 tablets by mouth every 4 (four) hours as needed for moderate pain. 60 tablet 0  . rosuvastatin (CRESTOR) 20 MG tablet Take 20 mg by mouth daily.     Marland Kitchen telmisartan-hydrochlorothiazide (MICARDIS HCT) 80-25 MG tablet Take 1 tablet by mouth daily.      No facility-administered medications prior to visit.     PAST MEDICAL HISTORY: Past Medical History:  Diagnosis Date  . Arthritis    knees  . Black tarry stools    05-14-16 negative  for occult blood with ER visit- noted in Epic.  . Cancer [facility])    thyroid cancer- surgery and radiation  . Chronic kidney disease    questionable mass on kidney. Being followed by Dr Diona Fanti  . Complication of anesthesia    heart rate was really low  . Diabetes mellitus    type !  . Full dentures   . Hypertension   . Hypothyroidism   . Spinal stenosis   . Stroke [facility]) 09/2014   left sided weakness    PAST SURGICAL HISTORY: Past Surgical History:  Procedure Laterality Date  . ABDOMINAL HYSTERECTOMY     partial  . cataracts     Removed  11/2015  bilateral  . COLONOSCOPY W/ POLYPECTOMY    . ERCP N/A 05/07/2016   Procedure: ENDOSCOPIC RETROGRADE CHOLANGIOPANCREATOGRAPHY (ERCP);  Surgeon: Clarene Essex, MD;  Location: [facility] END[facility]COPY;  Service:  Endoscopy;  Laterality: N/A;  . EUS N/A 05/27/2016   Procedure: ESOPHAGEAL ENDOSCOPIC ULTRASOUND (EUS) RADIAL;  Surgeon: Arta Silence, MD;  Location: [facility] ENDOSCOPY;  Service: Endoscopy;  Laterality: N/A;  . FLEXIBLE SIGMOIDOSCOPY  03/29/2012   Procedure: FLEXIBLE SIGMOIDOSCOPY;  Surgeon: Jeryl Columbia, MD;  Location: [facility] ENDOSCOPY;  Service: Endoscopy;  Laterality: N/A;  fleet enema upon arrival  . HOT HEMOSTASIS  03/29/2012   Procedure: HOT HEMOSTASIS (ARGON PLASMA COAGULATION/BICAP);  Surgeon: Jeryl Columbia, MD;  Location: [facility] ENDOSCOPY;  Service: Endoscopy;  Laterality: N/A;  . IR GENERIC HISTORICAL  06/30/2016   IR RADIOLOGIST EVAL & MGMT 06/30/2016 Aletta Edouard, MD [facility] INTERV RAD  . IR GENERIC HISTORICAL  09/09/2016   IR RADIOLOGIST EVAL & MGMT 09/09/2016 Aletta Edouard, MD [facility] INTERV RAD  . LUMBAR LAMINECTOMY/DECOMPRESSION MICRODISCECTOMY N/A 02/10/2016   Procedure: Lumbar three- four Laminectomy;  Surgeon: Kristeen Miss, MD;  Location: [facility]O [facility]S;  Service: Neurosurgery;  Laterality: N/A;  L3-4 Laminectomy  . LUMBAR SPINE SURGERY     1st surgery "ray cage placed"  . THYROIDECTOMY  2005  . TONSILLECTOMY      FAMILY HISTORY: Family History  Problem Relation Age of Onset  . Heart failure Mother   . Heart attack Father     SOCIAL HISTORY: Social History   Social History  . Marital status: Widowed    Spouse name: N/A  . Number of children: N/A  . Years of education: N/A   Occupational History  . retired     Buyer, retail   Social History Main Topics  . Smoking status: Never Smoker  . Smokeless tobacco: Never Used  . Alcohol use No  . Drug use: No  . Sexual activity: No   Other Topics Concern  . Not on file   Social History Narrative   Separated, retired, 3 children living, 2 children deceased   Caffeine use - soda every few days   Right handed   Pt lives alone.  Using cane when out and about.       PHYSICAL EXAM  Vitals:   09/16/16 1559  BP: (!)  181/84  Pulse: 71  Weight: 204 lb 9.6 oz (92.8 kg)   Body mass index is 35.12 kg/m.  Generalized: Frail elderly African-American lady, in no acute distress   Neurological examination  Mentation: Alert oriented to time, place, history taking. Follows all commands speech and language fluent Cranial nerve II-XII: Pupils were equal round reactive to light. Extraocular movements were full, visual field were full on confrontational test. Facial sensation and strength were normal. Uvula tongue midline. Head  turning and shoulder shrug  were normal and symmetric. Motor: The motor testing reveals 5 over 5 strength of all 4 extremities. Good symmetric motor tone is noted throughout. Diminished fine finger movements on the left. Orbits right over left approximately. Mild weakness of left grip. Sensory: Sensory testing is intact to soft touch on all 4 extremities. No evidence of extinction is noted.  Coordination: Cerebellar testing reveals good finger-nose-finger and heel-to-shin bilaterally.  Gait and station: Gait is normal- Will use a cane for longer distances. Tandem gait Not attempted. Romberg is negative. No drift is seen.  Reflexes: Deep tendon reflexes are symmetric and normal bilaterally.   DIAGNOSTIC DATA (LABS, IMAGING, TESTING) - I reviewed patient records, labs, notes, testing and imaging myself where available.  Lab Results  Component Value Date   WBC 8.4 07/16/2016   HGB 11.6 (L) 07/16/2016   HCT 35.2 (L) 07/16/2016   MCV 97.0 07/16/2016   PLT 280 07/16/2016      Component Value Date/Time   NA 140 07/16/2016 0409   K 4.9 07/16/2016 0409   CL 103 07/16/2016 0409   CO2 32 07/16/2016 0409   GLUCOSE 258 (H) 07/16/2016 0409   BUN 20 07/16/2016 0409   CREATININE 0.96 07/16/2016 0409   CALCIUM 8.5 (L) 07/16/2016 0409   PROT 7.2 05/18/2016 1220   ALBUMIN 2.7 (L) 05/18/2016 1220   AST 155 (H) 05/18/2016 1220   ALT 106 (H) 05/18/2016 1220   ALKPHOS 545 (H) 05/18/2016 1220    BILITOT 2.6 (H) 05/18/2016 1220   GFRNONAA 56 (L) 07/16/2016 0409   GFRAA >60 07/16/2016 0409   Lab Results  Component Value Date   CHOL 149 09/13/2014   HDL 50 09/13/2014   LDLCALC 73 09/13/2014   TRIG 131 09/13/2014   CHOLHDL 3.0 09/13/2014   Lab Results  Component Value Date   HGBA1C 9.7 (H) 07/07/2016      ASSESSMENT AND PLAN 81 y.o. year old female  has a past medical history of Arthritis; Black tarry stools; Cancer ([place]); Chronic kidney disease; Complication of anesthesia; Diabetes mellitus; Full dentures; Hypertension; Hypothyroidism; Spinal stenosis; and Stroke ([place]) (09/2014). here with:with right thalamic infarct in January 2016 secondary to small vessel disease with vascular risk factors of diabetes, hypertension, hyperlipidemia, obesity, age and sex     I had a long d/w patient about her remote lacunar stroke, risk for recurrent stroke/TIAs, personally independently reviewed imaging studies and stroke evaluation results and answered questions.Continue Plavix  for secondary stroke prevention and maintain strict control of hypertension with blood pressure goal below 130/90, diabetes with hemoglobin A1c goal below 6.5% and lipids with LDL cholesterol goal below 70 mg/dL. I also advised the patient to eat a healthy diet with plenty of whole grains, cereals, fruits and vegetables, exercise regularly and maintain ideal body weight. Check follow up screening carotid ultrasound study. Greater than 50% time during this 25 minute visit was spent on counseling and coordination of care about stroke and TIA risk, prevention and treatment discussion No scheduled followup in the future with me in  future is necessar     Antony Contras, MD 09/16/2016, 4:23 PM [facility] [address] [place], [place] [address] [phone]

## 2016-09-16 NOTE — Patient Instructions (Signed)
I had a long d/w patient about her remote lacunar stroke, risk for recurrent stroke/TIAs, personally independently reviewed imaging studies and stroke evaluation results and answered questions.Continue Plavix  for secondary stroke prevention and maintain strict control of hypertension with blood pressure goal below 130/90, diabetes with hemoglobin A1c goal below 6.5% and lipids with LDL cholesterol goal below 70 mg/dL. I also advised the patient to eat a healthy diet with plenty of whole grains, cereals, fruits and vegetables, exercise regularly and maintain ideal body weight. Check follow up screening carotid ultrasound study. No scheduled followup in the future with me in  future is necessary

## 2016-09-23 ENCOUNTER — Ambulatory Visit (INDEPENDENT_AMBULATORY_CARE_PROVIDER_SITE_OTHER): Payer: Medicare Other

## 2016-09-23 DIAGNOSIS — I6529 Occlusion and stenosis of unspecified carotid artery: Secondary | ICD-10-CM

## 2016-09-30 ENCOUNTER — Telehealth: Payer: Self-pay | Admitting: Neurology

## 2016-09-30 NOTE — Telephone Encounter (Signed)
Pt request results for carotid test

## 2016-10-02 NOTE — Telephone Encounter (Signed)
Rn call patient about her results. Rn stated per Dr.Sethi review the carotid doppler report was negative. PT verbalized understanding. Rn stated to pt she has no more follow up appointments. She is to follow up only if needed. Pt verbalized understanding.

## 2016-10-02 NOTE — Telephone Encounter (Signed)
Dr.Sethi reviewed her carotid test and it was negative.

## 2016-10-15 ENCOUNTER — Encounter (HOSPITAL_COMMUNITY): Payer: Self-pay | Admitting: *Deleted

## 2016-10-15 ENCOUNTER — Other Ambulatory Visit: Payer: Self-pay | Admitting: Gastroenterology

## 2016-10-15 NOTE — Progress Notes (Signed)
Spoke with pt for pre-op call. Pt denies cardiac history, chest pain or sob. Pt is diabetic, ? Type 2. Pt states her last A1C was 8.3 about 6 weeks ago. States fasting blood sugar is usually between 140-160. Instructed pt to take 70% of her Novolog 50/50 Insulin this evening (will take 42 units) and not to take any in the AM. Pt instructed to check her blood sugar in the AM when she gets up and every 2 hours until she leaves for the hospital. If blood sugar is 70 or below, treat with 1/2 cup of clear juice (apple or cranberry) and recheck blood sugar 15 minutes after drinking juice. If blood sugar continues to be 70 or below, call the Endoscopy department and ask to speak to a nurse. She voiced understanding. Pt is on Plavix, last dose was yesterday, 10/14/16.   EKG - 07/15/16 - in EPIC Stress test - 01/31/16 - in EPIC Echo - 01/30/16 - in EPIC

## 2016-10-16 ENCOUNTER — Ambulatory Visit (HOSPITAL_COMMUNITY)
Admission: RE | Admit: 2016-10-16 | Discharge: 2016-10-16 | Disposition: A | Discharge status: Home / Self Care | Payer: Medicare Other | Source: Ambulatory Visit | Attending: Gastroenterology | Admitting: Gastroenterology

## 2016-10-16 ENCOUNTER — Encounter (HOSPITAL_COMMUNITY): Payer: Self-pay

## 2016-10-16 ENCOUNTER — Ambulatory Visit (HOSPITAL_COMMUNITY): Payer: Medicare Other | Admitting: Certified Registered Nurse Anesthetist

## 2016-10-16 ENCOUNTER — Ambulatory Visit (HOSPITAL_COMMUNITY): Payer: Medicare Other

## 2016-10-16 ENCOUNTER — Encounter (HOSPITAL_COMMUNITY)
Admission: RE | Disposition: A | Discharge status: Home / Self Care | Payer: Self-pay | Source: Ambulatory Visit | Attending: Gastroenterology

## 2016-10-16 DIAGNOSIS — Z800 Family history of malignant neoplasm of digestive organs: Secondary | ICD-10-CM | POA: Insufficient documentation

## 2016-10-16 DIAGNOSIS — Z79899 Other long term (current) drug therapy: Secondary | ICD-10-CM | POA: Insufficient documentation

## 2016-10-16 DIAGNOSIS — K831 Obstruction of bile duct: Secondary | ICD-10-CM | POA: Insufficient documentation

## 2016-10-16 DIAGNOSIS — R945 Abnormal results of liver function studies: Secondary | ICD-10-CM

## 2016-10-16 DIAGNOSIS — Z85850 Personal history of malignant neoplasm of thyroid: Secondary | ICD-10-CM | POA: Diagnosis not present

## 2016-10-16 DIAGNOSIS — E119 Type 2 diabetes mellitus without complications: Secondary | ICD-10-CM | POA: Diagnosis not present

## 2016-10-16 DIAGNOSIS — R7989 Other specified abnormal findings of blood chemistry: Secondary | ICD-10-CM

## 2016-10-16 DIAGNOSIS — C259 Malignant neoplasm of pancreas, unspecified: Secondary | ICD-10-CM | POA: Diagnosis not present

## 2016-10-16 DIAGNOSIS — Z6833 Body mass index (BMI) 33.0-33.9, adult: Secondary | ICD-10-CM | POA: Insufficient documentation

## 2016-10-16 DIAGNOSIS — R634 Abnormal weight loss: Secondary | ICD-10-CM | POA: Diagnosis not present

## 2016-10-16 DIAGNOSIS — Z8673 Personal history of transient ischemic attack (TIA), and cerebral infarction without residual deficits: Secondary | ICD-10-CM | POA: Diagnosis not present

## 2016-10-16 DIAGNOSIS — Z794 Long term (current) use of insulin: Secondary | ICD-10-CM | POA: Insufficient documentation

## 2016-10-16 DIAGNOSIS — E785 Hyperlipidemia, unspecified: Secondary | ICD-10-CM | POA: Insufficient documentation

## 2016-10-16 DIAGNOSIS — I10 Essential (primary) hypertension: Secondary | ICD-10-CM | POA: Insufficient documentation

## 2016-10-16 DIAGNOSIS — C240 Malignant neoplasm of extrahepatic bile duct: Secondary | ICD-10-CM | POA: Diagnosis not present

## 2016-10-16 DIAGNOSIS — R932 Abnormal findings on diagnostic imaging of liver and biliary tract: Secondary | ICD-10-CM | POA: Diagnosis present

## 2016-10-16 HISTORY — DX: Pneumonia, unspecified organism: J18.9

## 2016-10-16 HISTORY — PX: ERCP: SHX5425

## 2016-10-16 LAB — GLUCOSE, CAPILLARY
Glucose-Capillary: 110 mg/dL — ABNORMAL HIGH (ref 65–99)
Glucose-Capillary: 59 mg/dL — ABNORMAL LOW (ref 65–99)
Glucose-Capillary: 71 mg/dL (ref 65–99)

## 2016-10-16 SURGERY — ERCP, WITH INTERVENTION IF INDICATED
Anesthesia: General

## 2016-10-16 MED ORDER — INDOMETHACIN 50 MG RE SUPP
RECTAL | Status: AC
  Filled 2016-10-16: qty 2

## 2016-10-16 MED ORDER — SUGAMMADEX SODIUM 200 MG/2ML IV SOLN
INTRAVENOUS | Status: DC | PRN
  Administered 2016-10-16: 200 mg via INTRAVENOUS

## 2016-10-16 MED ORDER — ROCURONIUM BROMIDE 100 MG/10ML IV SOLN
INTRAVENOUS | Status: DC | PRN
  Administered 2016-10-16: 30 mg via INTRAVENOUS

## 2016-10-16 MED ORDER — GLUCAGON HCL RDNA (DIAGNOSTIC) 1 MG IJ SOLR
INTRAMUSCULAR | Status: AC
  Filled 2016-10-16: qty 1

## 2016-10-16 MED ORDER — SUCCINYLCHOLINE CHLORIDE 200 MG/10ML IV SOSY
PREFILLED_SYRINGE | INTRAVENOUS | Status: DC | PRN
  Administered 2016-10-16: 100 mg via INTRAVENOUS

## 2016-10-16 MED ORDER — IOPAMIDOL (ISOVUE-300) INJECTION 61%
INTRAVENOUS | Status: DC | PRN
  Administered 2016-10-16: 40 mL

## 2016-10-16 MED ORDER — LACTATED RINGERS IV SOLN
INTRAVENOUS | Status: DC | PRN
  Administered 2016-10-16 (×2): via INTRAVENOUS

## 2016-10-16 MED ORDER — DEXTROSE 50 % IV SOLN
25.0000 mL | Freq: Once | INTRAVENOUS | Status: AC
  Administered 2016-10-16: 25 mL via INTRAVENOUS

## 2016-10-16 MED ORDER — PHENYLEPHRINE 40 MCG/ML (10ML) SYRINGE FOR IV PUSH (FOR BLOOD PRESSURE SUPPORT)
PREFILLED_SYRINGE | INTRAVENOUS | Status: DC | PRN
  Administered 2016-10-16: 80 ug via INTRAVENOUS
  Administered 2016-10-16 (×2): 40 ug via INTRAVENOUS
  Administered 2016-10-16 (×2): 80 ug via INTRAVENOUS

## 2016-10-16 MED ORDER — FENTANYL CITRATE (PF) 100 MCG/2ML IJ SOLN
INTRAMUSCULAR | Status: DC | PRN
Start: 2016-10-16 — End: 2016-10-16
  Administered 2016-10-16: 100 ug via INTRAVENOUS

## 2016-10-16 MED ORDER — ONDANSETRON HCL 4 MG/2ML IJ SOLN
INTRAMUSCULAR | Status: DC | PRN
  Administered 2016-10-16: 4 mg via INTRAVENOUS

## 2016-10-16 MED ORDER — DEXTROSE 50 % IV SOLN
INTRAVENOUS | Status: AC
  Filled 2016-10-16: qty 50

## 2016-10-16 MED ORDER — DEXTROSE 5 % IV SOLN
1.0000 g | INTRAVENOUS | Status: AC
  Administered 2016-10-16: 1 g via INTRAVENOUS
  Filled 2016-10-16: qty 1

## 2016-10-16 MED ORDER — SODIUM CHLORIDE 0.9 % IV SOLN: INTRAVENOUS | Status: DC

## 2016-10-16 MED ORDER — IOPAMIDOL (ISOVUE-300) INJECTION 61%
INTRAVENOUS | Status: AC
  Filled 2016-10-16: qty 50

## 2016-10-16 MED ORDER — PROPOFOL 10 MG/ML IV BOLUS
INTRAVENOUS | Status: DC | PRN
  Administered 2016-10-16: 130 mg via INTRAVENOUS

## 2016-10-16 NOTE — Op Note (Signed)
[facility] Patient Name: Kaylee Mullins Procedure Date : 10/16/2016 MRN: [id number] Attending MD: Clarene Essex , MD Date of Birth: 1935/09/29 CSN: [id number] Age: 81 Admit Type: Outpatient Procedure:                ERCP Indications:              Abnormal MRCPQuestionable CBD stone distal duct                            obstruction concerns over pancreatic cancer in                            patient with cystic neoplasm of the pancreas Providers:                Clarene Essex, MD, Carolynn Comment, RN, Alfonso Patten,                            Technician, [name], CRNA, Otis Brace,                            MD Referring MD:              Medicines:                General Anesthesia Complications:            No immediate complications. Estimated Blood Loss:     Estimated blood loss: none. Procedure:                Pre-Anesthesia Assessment:                           - Prior to the procedure, a History and Physical                            was performed, and patient medications and                            allergies were reviewed. The patient's tolerance of                            previous anesthesia was also reviewed. The risks                            and benefits of the procedure and the sedation                            options and risks were discussed with the patient.                            All questions were answered, and informed consent                            was obtained. Prior Anticoagulants: The patient has                            taken Plavix (  clopidogrel). ASA Grade Assessment:                            III - A patient with severe systemic disease. After                            reviewing the risks and benefits, the patient was                            deemed in satisfactory condition to undergo the                            procedure.                           After obtaining informed consent, the scope was            passed under direct vision. Throughout the                            procedure, the patient's blood pressure, pulse, and                            oxygen saturations were monitored continuously. The                            [id number]) scope was introduced through                            the mouth, and used to inject contrast into and                            used to inject contrast into the bile duct. The                            ERCP was accomplished without difficulty. The                            patient tolerated the procedure well. The ERCP was                            accomplished without difficulty. The patient                            tolerated the procedure well. my partner Dr. b                            assisted with the procedure Scope In: Scope Out: Findings:      A small frondlike/villous mass was found at the major papilla. it was       biopsied at the end of the procedure and the previous sphincterotomy       site was seen just above it and easily cannulated with the       sphincterotome loaded with the 0.035 inch Jagwire whichwas passed into  the biliary tree. the duct was significantly dilated approximately 2 cm       down to the ampulla and To discover objects, the biliary tree was swept       with a 15 mm balloon starting at the bifurcation. Nothing was found. we       did use the injection below balloonbut could not get good pictures of       the distal stricture and duct and we then proceeded with brushing for       Cells for cytology were obtained by brushing. This mass was biopsied       with a cold forceps for histologyat the end of the procedure.but after       brushing we proceeded with placing One 10 Fr by 4 cm covered metal stent       was placed 3 cm into the common bile duct. Bile flowed through the       stent. The stent was in good positionat the end of the procedure       although initially needed to be pulled out  a little with the biopsy       forceps after deployment. Impression:               - The major papilla appeared to have a massbiopsied                            at the end of the procedure.                           - The biliary tree was swept and nothing was found.                            no pancreatic duct injection or wire advancement                            throughout the procedure                           - One covered metal stent was placed into the                            common bile duct for a very short stricture which                            was brushed prior to placing the stent. Recommendation:           - Clear liquid diet for 6 hours. slowly advance as                            tolerated                           - Resume Plavix (clopidogrel) at prior dose                            tomorrow.                           -  Return to GI clinic in 1 week.                           - Telephone GI clinic if symptomatic PRN. Procedure Code(s):        --- Professional ---                           [phone], Endoscopic retrograde                            cholangiopancreatography (ERCP); with placement of                            endoscopic stent into biliary or pancreatic duct,                            including pre- and post-dilation and guide wire                            passage, when performed, including sphincterotomy,                            when performed, each stent                           [id number], Endoscopic retrograde                            cholangiopancreatography (ERCP); with biopsy,                            single or multiple Diagnosis Code(s):        --- Professional ---                           K83.9, Disease of biliary tract, unspecified                           R93.2, Abnormal findings on diagnostic imaging of                            liver and biliary tract CPT copyright 2016 American Medical Association. All rights reserved. The  codes documented in this report are preliminary and upon coder review may  be revised to meet current compliance requirements. Clarene Essex, MD 10/16/2016 9:36:12 AM This report has been signed electronically. Otis Brace, MD Number of Addenda: 0

## 2016-10-16 NOTE — Anesthesia Procedure Notes (Signed)
Procedure Name: Intubation Date/Time: 10/16/2016 8:16 AM Performed by: Carney Living Pre-anesthesia Checklist: Patient identified, Emergency Drugs available, Suction available, Patient being monitored and Timeout performed Patient Re-evaluated:Patient Re-evaluated prior to inductionOxygen Delivery Method: Circle system utilized Preoxygenation: Pre-oxygenation with 100% oxygen Intubation Type: IV induction Ventilation: Mask ventilation without difficulty Laryngoscope Size: Mac and 3 Grade View: Grade II Tube type: Oral Tube size: 7.0 mm Number of attempts: 1 Airway Equipment and Method: Stylet Placement Confirmation: ETT inserted through vocal cords under direct vision,  positive ETCO2 and breath sounds checked- equal and bilateral Secured at: 21 cm Tube secured with: Tape Dental Injury: Teeth and Oropharynx as per pre-operative assessment

## 2016-10-16 NOTE — Progress Notes (Signed)
Hypoglycemic Event  CBG: 59  Treatment: D50 IV 25 mL  Symptoms: Shaky  Follow-up CBG: Time: 800 CBG Result: 110  Possible Reasons for Event: Inadequate meal intake  Comments/MD notified:Dr. Glennon Mac, anesthesia notified at 755 am    Kaylee Mullins A

## 2016-10-16 NOTE — Progress Notes (Signed)
Call placed to Dr. Glennon Mac to discuss BP 202/69 at this time, BP on admission was 238/66. No new orders received. Per Dr. Glennon Mac patient is ok for discharge when ready.

## 2016-10-16 NOTE — Discharge Instructions (Addendum)
YOU HAD AN ENDOSCOPIC PROCEDURE TODAY: Refer to the procedure report and other information in the discharge instructions given to you for any specific questions about what was found during the examination. If this information does not answer your questions, please call [facility] office at [phone] to clarify. Please call if question or problem otherwise probable follow-up Wednesday or Thursday to review pathology repeat lab work and decide further workup and plans also may resume Plavix tomorrow if no signs of bleeding and Okay  YOU SHOULD EXPECT: Some feelings of bloating in the abdomen. Passage of more gas than usual. Walking can help get rid of the air that was put into your GI tract during the procedure and reduce the bloating. If you had a lower endoscopy (such as a colonoscopy or flexible sigmoidoscopy) you may notice spotting of blood in your stool or on the toilet paper. Some abdominal soreness may be present for a day or two, also.  DIET: Clear liquids only for 6 hours and then may slowly advance diet as tolerated. This will be after 4pm today. Your first meal following the procedure should be a light meal and then it is ok to progress to your normal diet. A half-sandwich or bowl of soup is an example of a good first meal. Heavy or fried foods are harder to digest and may make you feel nauseous or bloated. Drink plenty of fluids but you should avoid alcoholic beverages for 24 hours. If you had a esophageal dilation, please see attached instructions for diet.   ACTIVITY: Your care partner should take you home directly after the procedure. You should plan to take it easy, moving slowly for the rest of the day. You can resume normal activity the day after the procedure however YOU SHOULD NOT DRIVE, use power tools, machinery or perform tasks that involve climbing or major physical exertion for 24 hours (because of the sedation medicines used during the test).   SYMPTOMS TO REPORT IMMEDIATELY: A  gastroenterologist can be reached at any hour. Please call [phone]  for any of the following symptoms:  Following lower endoscopy (colonoscopy, flexible sigmoidoscopy) Excessive amounts of blood in the stool  Significant tenderness, worsening of abdominal pains  Swelling of the abdomen that is new, acute  Fever of 100 or higher  Following upper endoscopy (EGD, EUS, ERCP, esophageal dilation) Vomiting of blood or coffee ground material  New, significant abdominal pain  New, significant chest pain or pain under the shoulder blades  Painful or persistently difficult swallowing  New shortness of breath  Black, tarry-looking or red, bloody stools  FOLLOW UP:  If any biopsies were taken you will be contacted by phone or by letter within the next 1-3 weeks. Call [phone]  if you have not heard about the biopsies in 3 weeks.  Please also call with any specific questions about appointments or follow up tests.

## 2016-10-16 NOTE — Progress Notes (Signed)
Kaylee Mullins 8:03 AM  Subjective: Patient with no new complaints since we saw her recently in the office except she feels like her stomach is more swollen and she continues to have mostly lower discomfort and itching  Objective: Vital signs stable afebrile no acute distress exam please see pre-assessment evaluation she is icteric today  Assessment: Obstructive jaundice abnormal MRCP  Plan: Okay to proceed with ERCP possible stone extraction and probable brushing and stenting with anesthesia assistance  [facility]  Pager [phone] After 5PM or if no answer call [phone]

## 2016-10-16 NOTE — Anesthesia Postprocedure Evaluation (Signed)
Anesthesia Post Note  Patient: Kaylee Mullins  Procedure(s) Performed: Procedure(s) (LRB): ENDOSCOPIC RETROGRADE CHOLANGIOPANCREATOGRAPHY (ERCP) (N/A)  Patient location during evaluation: Endoscopy Anesthesia Type: General Level of consciousness: awake and alert, oriented and patient cooperative Pain management: pain level controlled Vital Signs Assessment: post-procedure vital signs reviewed and stable Respiratory status: spontaneous breathing, nonlabored ventilation, respiratory function stable and patient connected to nasal cannula oxygen Cardiovascular status: blood pressure returned to baseline and stable Postop Assessment: no signs of nausea or vomiting Anesthetic complications: no       Last Vitals:  Vitals:   10/16/16 0935 10/16/16 0945  BP: (!) 189/60 (!) 202/69  Pulse:  (!) 51  Resp: 11 16  Temp: 36.9 C     Last Pain:  Vitals:   10/16/16 0935  TempSrc: Oral                 Benzion Mesta,E. Wynnie Pacetti

## 2016-10-16 NOTE — Transfer of Care (Signed)
Immediate Anesthesia Transfer of Care Note  Patient: Kaylee Mullins  Procedure(s) Performed: Procedure(s): ENDOSCOPIC RETROGRADE CHOLANGIOPANCREATOGRAPHY (ERCP) (N/A)  Patient Location: Endoscopy Unit  Anesthesia Type:General  Level of Consciousness: awake, alert , oriented and patient cooperative  Airway & Oxygen Therapy: Patient Spontanous Breathing and Patient connected to nasal cannula oxygen  Post-op Assessment: Report given to RN, Post -op Vital signs reviewed and stable and Patient moving all extremities X 4  Post vital signs: Reviewed and stable  Last Vitals:  Vitals:   10/16/16 0738  BP: (!) 238/66  Pulse: (!) 49  Resp: 17  Temp: 36.7 C    Last Pain:  Vitals:   10/16/16 0738  TempSrc: Oral         Complications: No apparent anesthesia complications

## 2016-10-16 NOTE — Anesthesia Preprocedure Evaluation (Addendum)
Anesthesia Evaluation  Patient identified by MRN, date of birth, ID band Patient awake    Reviewed: Allergy & Precautions, NPO status , Patient's Chart, lab work & pertinent test results, reviewed documented beta blocker date and time   History of Anesthesia Complications Negative for: history of anesthetic complications  Airway Mallampati: II  TM Distance: >3 FB Neck ROM: Full    Dental  (+) Edentulous Upper, Edentulous Lower, Dental Advisory Given   Pulmonary neg pulmonary ROS,    breath sounds clear to auscultation       Cardiovascular hypertension, Pt. on medications and Pt. on home beta blockers (-) angina Rhythm:Regular Rate:Normal  '17 ECHO: EF 55-60%, valves OK   Neuro/Psych CVA, Residual Symptoms    GI/Hepatic Neg liver ROS, GERD  Poorly Controlled,  Endo/Other  diabetes (glu 110), Type 2, Insulin DependentHypothyroidism Morbid obesity  Renal/GU Renal InsufficiencyRenal disease     Musculoskeletal  (+) Arthritis , Osteoarthritis,    Abdominal (+) + obese,   Peds  Hematology   Anesthesia Other Findings Left sided weakness from CVA  Reproductive/Obstetrics                            Anesthesia Physical Anesthesia Plan  ASA: III  Anesthesia Plan: General   Post-op Pain Management:    Induction: Intravenous  Airway Management Planned: Oral ETT  Additional Equipment:   Intra-op Plan:   Post-operative Plan: Extubation in OR  Informed Consent: I have reviewed the patients History and Physical, chart, labs and discussed the procedure including the risks, benefits and alternatives for the proposed anesthesia with the patient or authorized representative who has indicated his/her understanding and acceptance.   Dental advisory given  Plan Discussed with: CRNA, Anesthesiologist and Surgeon  Anesthesia Plan Comments: (Plan routine monitors, GETA)       Anesthesia Quick  Evaluation

## 2016-10-19 ENCOUNTER — Encounter (HOSPITAL_COMMUNITY): Payer: Self-pay | Admitting: Gastroenterology

## 2016-10-20 ENCOUNTER — Other Ambulatory Visit: Payer: Self-pay | Admitting: Gastroenterology

## 2016-10-20 DIAGNOSIS — R7401 Elevation of levels of liver transaminase levels: Secondary | ICD-10-CM

## 2016-10-20 DIAGNOSIS — R740 Nonspecific elevation of levels of transaminase and lactic acid dehydrogenase [LDH]: Secondary | ICD-10-CM

## 2016-10-20 DIAGNOSIS — R198 Other specified symptoms and signs involving the digestive system and abdomen: Secondary | ICD-10-CM

## 2016-10-20 DIAGNOSIS — C259 Malignant neoplasm of pancreas, unspecified: Secondary | ICD-10-CM

## 2016-10-21 ENCOUNTER — Telehealth: Payer: Self-pay | Admitting: Oncology

## 2016-10-21 ENCOUNTER — Encounter: Payer: Self-pay | Admitting: Oncology

## 2016-10-21 NOTE — Telephone Encounter (Signed)
Appt scheduled to Dr. Benay Spice on 3/13 at 2pm. Pt aware to arrive 30 minutes early. Voiced understanding. Letter mailed.

## 2016-10-23 ENCOUNTER — Ambulatory Visit
Admission: RE | Admit: 2016-10-23 | Discharge: 2016-10-23 | Disposition: A | Discharge status: Home / Self Care | Payer: Medicare Other | Source: Ambulatory Visit | Attending: Gastroenterology | Admitting: Gastroenterology

## 2016-10-23 DIAGNOSIS — R198 Other specified symptoms and signs involving the digestive system and abdomen: Secondary | ICD-10-CM

## 2016-10-23 DIAGNOSIS — C259 Malignant neoplasm of pancreas, unspecified: Secondary | ICD-10-CM

## 2016-10-23 DIAGNOSIS — R740 Nonspecific elevation of levels of transaminase and lactic acid dehydrogenase [LDH]: Secondary | ICD-10-CM

## 2016-10-23 DIAGNOSIS — R7401 Elevation of levels of liver transaminase levels: Secondary | ICD-10-CM

## 2016-10-23 MED ORDER — IOPAMIDOL (ISOVUE-300) INJECTION 61%
100.0000 mL | Freq: Once | INTRAVENOUS | Status: AC | PRN
  Administered 2016-10-23: 100 mL via INTRAVENOUS

## 2016-10-27 ENCOUNTER — Ambulatory Visit (HOSPITAL_BASED_OUTPATIENT_CLINIC_OR_DEPARTMENT_OTHER): Payer: Medicare Other | Admitting: Oncology

## 2016-10-27 ENCOUNTER — Telehealth: Payer: Self-pay | Admitting: Oncology

## 2016-10-27 VITALS — BP 217/76 | HR 67 | Temp 98.6°F | Resp 18 | Ht 64.0 in | Wt 203.4 lb

## 2016-10-27 DIAGNOSIS — E119 Type 2 diabetes mellitus without complications: Secondary | ICD-10-CM | POA: Diagnosis not present

## 2016-10-27 DIAGNOSIS — Z85850 Personal history of malignant neoplasm of thyroid: Secondary | ICD-10-CM

## 2016-10-27 DIAGNOSIS — Z85528 Personal history of other malignant neoplasm of kidney: Secondary | ICD-10-CM

## 2016-10-27 DIAGNOSIS — C241 Malignant neoplasm of ampulla of Vater: Secondary | ICD-10-CM | POA: Diagnosis not present

## 2016-10-27 HISTORY — DX: Malignant neoplasm of ampulla of Vater: C24.1

## 2016-10-27 NOTE — Telephone Encounter (Signed)
Appointment with Dr [name] was scheduled for 11/23/16 @ 9:45 a.m. With arrival time of 9:15 a.m, which was the soonest per [facility] [place] [facility]. Janett Billow will send a msg to Dr [name] for an earlier day and will call patient if patient can be seen on an earlier date. Patient is aware. Dr Vassie Loll [phone] @ [address], [place], [place]. Appointments scheduled per 10/27/16 los. Patient was given a copy of the AVS report and appointment schedule per 10/27/16 los. Patient also given Dr Peggyann Juba address.

## 2016-10-27 NOTE — Progress Notes (Signed)
[facility] New Patient Consult   Referring NF:AOZH Magod  DANIJA GOSA 81 y.o.  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 Chuba was noted to have abnormal liver enzymes in the fall of 2017. An MRI of the abdomen 04/24/2016 revealed marked intra-and extrahepatic biliary duct dilatation to the level of the ampulla. Cystic change in the head of the pancreas was chronic. There was also interval enlargement of a right renal mass. She was taken to an ERCP procedure 05/07/2016. The common bile duct was dilated as was the pancreatic duct. A plastic stent was placed and a biopsy of the ampulla was obtained. The biopsy was benign. She underwent an endoscopic ultrasound by Dr. Paulita Fujita on 05/27/2016. Multiple cysts were noted in the pancreas with 2 enlarged peripancreatic lymph nodes. The pancreatic duct was dilated. The findings were felt to be consistent with main and side branch IPMN. She underwent ablation of the right renal mass on 07/15/2016. An MRI of the abdomen on 09/09/2016 revealed recurrent delirium pancreatic duct dilatation. Stable cyst in the head of the pancreas, status post cryoablation of the right renal lesion with no residual tumor. No evidence of metastatic disease. She developed jaundice. She was taken to a repeat ERCP procedure on 10/16/2016. A villous mass was found at the major papilla. The mass was biopsied. A metal stent was placed. The cytology from a common bile duct brushing confirmed adenocarcinoma. Biopsy of the mass revealed adenocarcinoma.  She reports the jaundice has partially improved. She continues to have pruritus.  A CT of the abdomen and pelvis on 10/23/2016 revealed benign-appearing lung nodules, no discrete liver mass, decompression of the intrahepatic bile ducts, stable pancreatic ductal dilatation, stable cystic pancreas masses, and a 1.3 x 0.9 cm ampullary mass. A stable ablation site in the right  kidney. 1.37 m porta hepatis node.  She is referred for oncology evaluation.    Past Medical History:  Diagnosis Date  . Arthritis    knees  . Black tarry stools    05-14-16 negative for occult blood with ER visit- noted in [facility].  . Cancer [facility]) 2005   thyroid cancer- surgery and radiation  . Chronic kidney disease    questionable mass on kidney. Being followed by Dr Diona Fanti  . Complication of anesthesia    heart rate was really low  . Diabetes mellitus    type 2  . Full dentures   . Hypertension   . Hypothyroidism   . Pneumonia   . Spinal stenosis   . Stroke ([place]) 09/2014   left sided weakness    .  Ampullary carcinoma                                                                                                      March 2018   . G5 P5   . Right renal cell carcinoma-status post ablation November 2017  Past Surgical History:  Procedure Laterality Date  . ABDOMINAL HYSTERECTOMY     partial  . cataracts  Removed  11/2015  bilateral  . COLONOSCOPY W/ POLYPECTOMY    . ERCP N/A 05/07/2016   Procedure: ENDOSCOPIC RETROGRADE CHOLANGIOPANCREATOGRAPHY (ERCP);  Surgeon: Clarene Essex, MD;  Location: [facility] END[facility];  Service: Endoscopy;  Laterality: N/A;  . ERCP N/A 10/16/2016   Procedure: ENDOSCOPIC RETROGRADE CHOLANGIOPANCREATOGRAPHY (ERCP);  Surgeon: Clarene Essex, MD;  Location[facility] ENDOSCOPY;  Service: Endoscopy;  Laterality: N/A;  . EUS N/A 05/27/2016   Procedure: ESOPHAGEAL ENDOSCOPIC ULTRASOUND (EUS) RADIAL;  Surgeon: Arta Silence, MD;  Location: [facility] ENDOSCOPY;  Service: Endoscopy;  Laterality: N/A;  . FLEXIBLE SIGMOIDOSCOPY  03/29/2012   Procedure: FLEXIBLE SIGMOIDOSCOPY;  Surgeon: Jeryl Columbia, MD;  Location: [facility] ENDOSCOPY;  Service: Endoscopy;  Laterality: N/A;  fleet enema upon arrival  . HOT HEMOSTASIS  03/29/2012   Procedure: HOT HEMOSTASIS (ARGON PLASMA COAGULATION/BICAP);  Surgeon: Jeryl Columbia, MD;  Location: [facility] ENDOSCOPY;  Service: Endoscopy;  Laterality: N/A;  . IR  GENERIC HISTORICAL  06/30/2016   IR RADIOLOGIST EVAL & MGMT 06/30/2016 Aletta Edouard, MD [facility] INTERV RAD  . IR GENERIC HISTORICAL  09/09/2016   IR RADIOLOGIST EVAL & MGMT 09/09/2016 Aletta Edouard, MD [facility] INTERV RAD  . LUMBAR LAMINECTOMY/DECOMPRESSION MICRODISCECTOMY N/A 02/10/2016   Procedure: Lumbar three- four Laminectomy;  Surgeon: Kristeen Miss, MD;  Location: [facility] NEURO ORS;  Service: Neurosurgery;  Laterality: N/A;  L3-4 Laminectomy  . LUMBAR SPINE SURGERY     1st surgery "ray cage placed"  . THYROIDECTOMY  2005  . TONSILLECTOMY      Medications: Reviewed  Allergies:  Allergies  Allergen Reactions  . Darvon [Propoxyphene Hcl] Other (See Comments)    hallucinations  . Nyquil Multi-Symptom [Pseudoeph-Doxylamine-Dm-Apap] Other (See Comments)    Makes pt not "feel right in her head"  . Metformin And Related Diarrhea    Family history:Her sister had "liver cancer "at age 37, her daughter had a brain tumor at age 86  Social History:   She lives alone in [place]. She worked in [employer]. She does not use cigarettes or alcohol. No transfusion history. No risk factor for HIV or hepatitis.  ROS:   Positives include: Pruritus, scleral icterus, weight loss prior to placement of the bile duct stent, mild left-sided arm and leg weakness following a CVA in 2016, dark urine and light-colored stool prior to placement of the bile duct stent, chronic left abdominal pain  A complete ROS was otherwise negative.  Physical Exam:  Blood pressure (!) 217/76, pulse 67, temperature 98.6 F (37 C), temperature source Oral, resp. rate 18, height 5\' 4"  (1.626 m), weight 203 lb 6.4 oz (92.3 kg), SpO2 99 %. Repeat manual systolic blood pressure in the 180s    HEENT: Upper and lower denture plate, oropharynx without visible mass, neck without mass Lungs: Clear bilaterally Cardiac: Regular rate and rhythm Abdomen: No hepatosplenomegaly, no mass, mild tenderness in the left lateral mid abdomen    Vascular: No leg edema Lymph nodes: No cervical, supraclavicular, axillary, or inguinal nodes Neurologic: Alert and oriented, the motor exam appears intact in the upper and lower extremities Skin: Hyperpigmented/lesions over the extremities and back, nodular component at the right upper back Musculoskeletal: No spine tenderness   Imaging: As per history of present illness, CT images 10/23/2016-reviewed     Assessment/Plan:   1. Ampullary carcinoma-ERCP with bile duct brushing and biopsy of a major papilla mass on 10/16/2016 confirmed adenocarcinoma  CT abdomen/pelvis 10/23/2016-ampullary mass, single mildly enlarged porta hepatis lymph node, no evidence of distant metastatic disease 2. Biliary obstruction secondary  to #1, status post placement of a metal, bile duct stent on 10/23/2016  3.   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    Renal cell carcinoma-status post ablation of a right renal mass 07/15/2016, biopsy confirmed papillary renal cell carcinoma, Fuhrman grade 3  5.     CVA in 2016  6.     History of thyroid cancer-status post thyroidectomy and radioactive iodine in 2005  7.     Diabetes   Disposition:   Ms. Osmundson has been diagnosed with adenocarcinoma of the major papilla/ampulla. She presented with obstructive jaundice, initially in 2017. A metal stent was placed 10/23/2016 and the jaundice has improved.  There is no evidence of distant metastatic disease. I will present her case at the GI tumor conference on 11/04/2016. I will refer her to Dr. [name] to consider surgery. She will return for an office visit and repeat chemistry panel on 11/13/2016.   If she is not a surgical candidate we will consider observation and chemotherapy/radiation options.  Approximately 50 minutes were spent with the patient today. The majority of the time was used for counseling and coordination of care.  Betsy Coder, MD  10/27/2016, 5:48 PM

## 2016-11-13 ENCOUNTER — Telehealth: Payer: Self-pay | Admitting: Oncology

## 2016-11-13 ENCOUNTER — Ambulatory Visit (HOSPITAL_BASED_OUTPATIENT_CLINIC_OR_DEPARTMENT_OTHER): Payer: Medicare Other | Admitting: Nurse Practitioner

## 2016-11-13 ENCOUNTER — Other Ambulatory Visit (HOSPITAL_BASED_OUTPATIENT_CLINIC_OR_DEPARTMENT_OTHER): Payer: Medicare Other

## 2016-11-13 VITALS — BP 217/70 | HR 61 | Temp 97.5°F | Resp 18 | Ht 64.0 in | Wt 208.4 lb

## 2016-11-13 DIAGNOSIS — Z85850 Personal history of malignant neoplasm of thyroid: Secondary | ICD-10-CM

## 2016-11-13 DIAGNOSIS — E119 Type 2 diabetes mellitus without complications: Secondary | ICD-10-CM | POA: Diagnosis not present

## 2016-11-13 DIAGNOSIS — C241 Malignant neoplasm of ampulla of Vater: Secondary | ICD-10-CM | POA: Diagnosis not present

## 2016-11-13 LAB — COMPREHENSIVE METABOLIC PANEL
ALT: 19 U/L (ref 0–55)
AST: 24 U/L (ref 5–34)
Albumin: 3.5 g/dL (ref 3.5–5.0)
Alkaline Phosphatase: 152 U/L — ABNORMAL HIGH (ref 40–150)
Anion Gap: 9 mEq/L (ref 3–11)
BUN: 18.7 mg/dL (ref 7.0–26.0)
CO2: 24 mEq/L (ref 22–29)
Calcium: 9.3 mg/dL (ref 8.4–10.4)
Chloride: 108 mEq/L (ref 98–109)
Creatinine: 1 mg/dL (ref 0.6–1.1)
EGFR: 66 mL/min/{1.73_m2} — ABNORMAL LOW (ref >90)
Glucose: 78 mg/dl (ref 70–140)
Potassium: 3.5 mEq/L (ref 3.5–5.1)
Sodium: 141 mEq/L (ref 136–145)
Total Bilirubin: 1.44 mg/dL — ABNORMAL HIGH (ref 0.20–1.20)
Total Protein: 8 g/dL (ref 6.4–8.3)

## 2016-11-13 NOTE — Progress Notes (Addendum)
  [facility] OFFICE PROGRESS NOTE   Diagnosis:  Ampullary carcinoma  INTERVAL HISTORY:   Kaylee Mullins returns as scheduled. She states that she feels "pretty good". Her stomach is "tender" at times. No nausea. No diarrhea. She reports a good appetite. She has had some weight gain.  Objective:  Vital signs in last 24 hours:  Blood pressure (!) 217/70, pulse 61, temperature 97.5 F (36.4 C), temperature source Oral, resp. rate 18, height 5\' 4"  (1.626 m), weight 208 lb 6.4 oz (94.5 kg), SpO2 100 %.    HEENT: White coating over tongue. No buccal thrush. Resp: Lungs clear bilaterally. Cardio: Regular rate and rhythm. GI: Abdomen is soft, nontender. No hepatomegaly. No mass. Vascular: No leg edema.    Lab Results:  Lab Results  Component Value Date   WBC 8.4 07/16/2016   HGB 11.6 (L) 07/16/2016   HCT 35.2 (L) 07/16/2016   MCV 97.0 07/16/2016   PLT 280 07/16/2016   NEUTROABS 3.3 07/07/2016   11/13/2016 bilirubin 1.44, alkaline phosphatase 152 Imaging:  No results found.  Medications: I have reviewed the patient's current medications.  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 CT abdomen/pelvis 10/23/2016-ampullary mass, single mildly enlarged porta hepatis lymph node, no evidence of distant metastatic disease 2. Biliary obstruction secondary to #1, status post placement of a metal, bile duct stent on 10/23/2016  3.   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    Renal cell carcinoma-status post ablation of a right renal mass 07/15/2016, biopsy confirmed papillary renal cell carcinoma, Fuhrman grade 3  5.     CVA in 2016  6.     History of thyroid cancer-status post thyroidectomy and radioactive iodine in 2005  7.     Diabetes   Disposition: Kaylee Mullins appears stable. She is scheduled to see Dr. [name] 11/18/2016. She will return  for a follow-up visit here on 11/23/2016 for further discussion. She will contact the office in the interim with any problems.  Patient seen with Dr. Benay Spice.    [name] ANP/GNP-BC   11/13/2016  2:16 PM  This was a shared visit with [name]. Kaylee Mullins no longer has jaundice. We discussed treatment options for the ampullary carcinoma. She will see Dr. [name] next week to consider surgery.  [name], M.D.

## 2016-11-13 NOTE — Telephone Encounter (Signed)
Gave patient AVS and calender per 11/13/2016 los. Per patient request 3:45 instead of 3:30 pm time slot.

## 2016-11-14 LAB — CANCER ANTIGEN 19-9: CA 19-9: 165 U/mL — ABNORMAL HIGH (ref 0–35)

## 2016-11-18 ENCOUNTER — Other Ambulatory Visit: Payer: Self-pay | Admitting: General Surgery

## 2016-11-19 ENCOUNTER — Other Ambulatory Visit: Payer: Self-pay | Admitting: General Surgery

## 2016-11-19 DIAGNOSIS — C241 Malignant neoplasm of ampulla of Vater: Secondary | ICD-10-CM

## 2016-11-23 ENCOUNTER — Telehealth: Payer: Self-pay | Admitting: Oncology

## 2016-11-23 ENCOUNTER — Ambulatory Visit (HOSPITAL_BASED_OUTPATIENT_CLINIC_OR_DEPARTMENT_OTHER): Payer: Medicare Other | Admitting: Oncology

## 2016-11-23 VITALS — BP 210/83 | HR 59 | Temp 97.7°F | Resp 18 | Ht 64.0 in | Wt 207.4 lb

## 2016-11-23 DIAGNOSIS — Z85850 Personal history of malignant neoplasm of thyroid: Secondary | ICD-10-CM | POA: Diagnosis not present

## 2016-11-23 DIAGNOSIS — C241 Malignant neoplasm of ampulla of Vater: Secondary | ICD-10-CM | POA: Diagnosis not present

## 2016-11-23 DIAGNOSIS — I10 Essential (primary) hypertension: Secondary | ICD-10-CM

## 2016-11-23 DIAGNOSIS — Z85528 Personal history of other malignant neoplasm of kidney: Secondary | ICD-10-CM | POA: Diagnosis not present

## 2016-11-23 DIAGNOSIS — E119 Type 2 diabetes mellitus without complications: Secondary | ICD-10-CM

## 2016-11-23 NOTE — Progress Notes (Signed)
  [facility] OFFICE PROGRESS NOTE   Diagnosis: Ampullary carcinoma  INTERVAL HISTORY:   [name] returns as scheduled. She no longer has pruritus or dark urine. She reports mild discomfort in the left lateral abdomen. She takes Tylenol occasionally. She saw Dr. [name] and is being scheduled for a Whipple procedure.  Objective:  Vital signs in last 24 hours:  Blood pressure (!) 210/83, pulse (!) 59, temperature 97.7 F (36.5 C), temperature source Oral, resp. rate 18, height 5\' 4"  (1.626 m), weight 207 lb 6.4 oz (94.1 kg), SpO2 100 %.    HEENT: Neck without mass Lymphatics: No cervical or supraclavicular nodes Resp: Coarse rhonchi at the left greater than right base, no respiratory distress Cardio: Regular rate and rhythm GI: No hepatosplenomegaly, no mass, nontender Vascular: Trace low leg edema bilaterally    Medications: I have reviewed the patient's current medications.  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 CT abdomen/pelvis 10/23/2016-ampullary mass, single mildly enlarged porta hepatis lymph node, no evidence of distant metastatic disease 2. Biliary obstruction secondary to #1, status post placement of a metal, bile duct stent on 10/23/2016  3. 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 Renal cell carcinoma-status post ablation of a right renal mass 07/15/2016, biopsy confirmed papillary renal cell carcinoma, Fuhrman grade 3  5. CVA in 2016  6. History of thyroid cancer-status post thyroidectomy and radioactive iodine in 2005  7. Diabetes  8.     Hypertension    Disposition:  She appears stable. She is being scheduled for resection of the ampullary carcinoma. I will defer additional staging to Dr. [name].  She has persistent hypertension. I recommended she follow-up with Dr. Jeanie Cooks for hypertension  management prior to surgery.  She will let us know the scheduled surgical date and I will check on her in the hospital. She will return for an office visit after surgery.  Betsy Coder, MD  11/23/2016  4:23 PM

## 2016-11-23 NOTE — Telephone Encounter (Signed)
Gave patient AVS and calender per 4/9 los. 

## 2016-11-24 ENCOUNTER — Ambulatory Visit: Payer: Medicare Other | Attending: General Surgery

## 2016-11-24 ENCOUNTER — Telehealth: Payer: Self-pay | Admitting: Oncology

## 2016-11-24 DIAGNOSIS — M6281 Muscle weakness (generalized): Secondary | ICD-10-CM | POA: Insufficient documentation

## 2016-11-24 DIAGNOSIS — R2689 Other abnormalities of gait and mobility: Secondary | ICD-10-CM | POA: Insufficient documentation

## 2016-11-24 DIAGNOSIS — R29898 Other symptoms and signs involving the musculoskeletal system: Secondary | ICD-10-CM | POA: Insufficient documentation

## 2016-11-24 NOTE — Telephone Encounter (Signed)
Called patient regarding Nutritionist appointment scheduled for 12/02/16 @ 9:45 a.m. Appointment confirmed with patient. 11/24/16

## 2016-11-25 ENCOUNTER — Ambulatory Visit
Admission: RE | Admit: 2016-11-25 | Discharge: 2016-11-25 | Disposition: A | Discharge status: Home / Self Care | Payer: Medicare Other | Source: Ambulatory Visit | Attending: General Surgery | Admitting: General Surgery

## 2016-11-25 ENCOUNTER — Ambulatory Visit: Payer: Medicare Other

## 2016-11-25 DIAGNOSIS — M6281 Muscle weakness (generalized): Secondary | ICD-10-CM

## 2016-11-25 DIAGNOSIS — R2689 Other abnormalities of gait and mobility: Secondary | ICD-10-CM | POA: Diagnosis present

## 2016-11-25 DIAGNOSIS — C241 Malignant neoplasm of ampulla of Vater: Secondary | ICD-10-CM

## 2016-11-25 DIAGNOSIS — R29898 Other symptoms and signs involving the musculoskeletal system: Secondary | ICD-10-CM | POA: Diagnosis present

## 2016-11-25 MED ORDER — IOPAMIDOL (ISOVUE-300) INJECTION 61%
100.0000 mL | Freq: Once | INTRAVENOUS | Status: AC | PRN
  Administered 2016-11-25: 100 mL via INTRAVENOUS

## 2016-11-25 NOTE — Patient Instructions (Addendum)
KNEE: Extension, Long Arc Quad (Weight)  Place weight around leg. Raise leg until knee is straight. Hold _5__ seconds. Use ___ lb weight. _10__ reps per set (each leg), 4-5__ sets per day, __7_ days per week  Copyright  VHI. All rights reserved.    Reverse Fly / Shoulder Retraction   Extend both arms in front of body at shoulder height, palms down, holding band. Move arms out to sides, squeeze shoulder blades together. Repeat _10__ times. Do _4-5__ sessions per day.   Copyright  VHI. All rights reserved.     Knee Raise   Lift knee and then lower it. Repeat with other knee. Repeat _10__ times each leg. Do _4-5___ sessions per day.  http://gt2.exer.us/445   Copyright  VHI. All rights reserved.  Toe Up   Gently rise up on toes and back on heels. Repeat _20___ times. Do 4-5____ sessions per day.  Adduction: Hip - Knees Together (Sitting)    Sit with towel roll between knees. Push knees together. Hold for _5__ seconds. Rest for _10__ seconds. Repeat _10__ times. Do _4-5__ times a day.  Copyright  VHI. All rights reserved.   [facility] [address] [place], [place] [address] Phone # [phone] Fax [phone]

## 2016-11-25 NOTE — Therapy (Signed)
[facility] [address] [place], [place], [address] Phone: [phone]   Fax:  [phone]  Physical Therapy Treatment  Patient Details  Name: Kaylee Mullins MRN: [id number] Date of Birth: April 09, 1936 Referring Provider: Stark Klein, MD  Encounter Date: 11/25/2016      PT End of Session - 11/25/16 1316    Visit Number 1   Number of Visits 10   Date for PT Re-Evaluation 01/20/17   PT Start Time 1234   PT Stop Time 1314   PT Time Calculation (min) 40 min   Activity Tolerance Patient tolerated treatment well   Behavior During Therapy [facility] for tasks assessed/performed      Past Medical History:  Diagnosis Date  . Arthritis    knees  . Black tarry stools    05-14-16 negative for occult blood with ER visit- noted in [place].  . Cancer [facility])    thyroid cancer- surgery and radiation  . Chronic kidney disease    questionable mass on kidney. Being followed by Dr Diona Fanti  . Complication of anesthesia    heart rate was really low  . Diabetes mellitus    type 2  . Full dentures   . Hypertension   . Hypothyroidism   . Pneumonia   . Spinal stenosis   . Stroke [facility]) 09/2014   left sided weakness    Past Surgical History:  Procedure Laterality Date  . ABDOMINAL HYSTERECTOMY     partial  . cataracts     Removed  11/2015  bilateral  . COLONOSCOPY W/ POLYPECTOMY    . ERCP N/A 05/07/2016   Procedure: ENDOSCOPIC RETROGRADE CHOLANGIOPANCREATOGRAPHY (ERCP);  Surgeon: Clarene Essex, MD;  Location: [facility];  Service: Endoscopy;  Laterality: N/A;  . ERCP N/A 10/16/2016   Procedure: ENDOSCOPIC RETROGRADE CHOLANGIOPANCREATOGRAPHY (ERCP);  Surgeon: Clarene Essex, MD;  [facility] ENDOSCOPY;  Service: Endoscopy;  Laterality: N/A;  . EUS N/A 05/27/2016   Procedure: ESOPHAGEAL ENDOSCOPIC ULTRASOUND (EUS) RADIAL;  Surgeon: Arta Silence, MD;  [facility] ENDOSCOPY;  Service: Endoscopy;  Laterality: N/A;  . FLEXIBLE  SIGMOIDOSCOPY  03/29/2012   Procedure: FLEXIBLE SIGMOIDOSCOPY;  Surgeon: Jeryl Columbia, MD;  Location: [facility] ENDOSCOPY;  Service: Endoscopy;  Laterality: N/A;  fleet enema upon arrival  . HOT HEMOSTASIS  03/29/2012   Procedure: HOT HEMOSTASIS (ARGON PLASMA COAGULATION/BICAP);  Surgeon: Jeryl Columbia, MD;  Location: [facility] ENDOSCOPY;  Service: Endoscopy;  Laterality: N/A;  . IR GENERIC HISTORICAL  06/30/2016   IR RADIOLOGIST EVAL & MGMT 06/30/2016 Aletta Edouard, MD [facility] INTERV RAD  . IR GENERIC HISTORICAL  09/09/2016   IR RADIOLOGIST EVAL & MGMT 09/09/2016 Aletta Edouard, MD [facility] INTERV RAD  . LUMBAR LAMINECTOMY/DECOMPRESSION MICRODISCECTOMY N/A 02/10/2016   Procedure: Lumbar three- four Laminectomy;  Surgeon: Kristeen Miss, MD;  Location: [place] NEURO ORS;  Service: Neurosurgery;  Laterality: N/A;  L3-4 Laminectomy  . LUMBAR SPINE SURGERY     1st surgery "ray cage placed"  . THYROIDECTOMY  2005  . TONSILLECTOMY      There were no vitals filed for this visit.      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  Pt with deconditioning and MD would pt to have rehab prior to surgery.     Pertinent History Cancer- bile duct (whipple surgery in 4 weeks), thyroid cancer (remission), CVA 2016   Limitations  Standing;Walking   How long can you stand comfortably? 10 minutes   How long can you walk comfortably? "I don't do a lot of walking"- 5 minutes at a time, uses motorized scooter in grocery store   Patient Stated Goals improve strength and endurance prior to surgery   Currently in Pain? Yes   Pain Score 2    Pain Location Back   Pain Orientation Left;Lower   Pain Descriptors / Indicators Aching;Tightness   Pain Type Chronic pain   Pain Onset More than a month ago   Pain Frequency Constant   Aggravating Factors  activity, when first starting to walk, standing to cook   Pain Relieving Factors  massager            [facility] PT Assessment - 11/25/16 0001      Assessment   Medical Diagnosis pre-op for whipple, deconditioning   Referring Provider Stark Klein, MD   Onset Date/Surgical Date 11/26/14  CVA 2016   Next MD Visit none   Prior Therapy after CVA for strength     Precautions   Precautions Fall;Other (comment)  active cancer     Restrictions   Weight Bearing Restrictions No     Balance Screen   Has the patient fallen in the past 6 months No   Has the patient had a decrease in activity level because of a fear of falling?  No   Is the patient reluctant to leave their home because of a fear of falling?  No     Home Environment   Living Environment Private residence   Living Arrangements Alone   Type of [place] to enter   Entrance Stairs-Number of Steps 1   Home Layout Two level   Alternate Level Stairs-Number of Steps 15  pt has Pharmacist, community - single point;Walker - 2 wheels;Toilet riser;Transport chair     Prior Function   Level of Independence Independent   Vocation Retired   Leisure short periods of walking     Cognition   Overall Cognitive Status Within Functional Limits for tasks assessed     Posture/Postural Control   Posture/Postural Control Postural limitations   Postural Limitations Forward head;Rounded Shoulders;Flexed trunk;Weight shift right     ROM / Strength   AROM / PROM / Strength PROM;AROM;Strength     AROM   Overall AROM  Within functional limits for tasks performed     PROM   Overall PROM  Within functional limits for tasks performed     Strength   Overall Strength Deficits   Overall Strength Comments Lt UE strength 4 to 4+/5, Rt 4+/5 to 5/5   Strength Assessment Site Hip;Knee;Ankle   Right/Left Hip Right;Left   Right Hip Flexion 4/5   Left Hip Flexion 4-/5   Right/Left Knee Right;Left   Right Knee Flexion 4+/5   Right Knee Extension 5/5   Left Knee Flexion 4-/5   Left Knee  Extension 4/5   Right/Left Ankle Right;Left   Right Ankle Dorsiflexion 4+/5   Left Ankle Dorsiflexion 4/5     Transfers   Transfers Sit to Stand;Stand to Sit   Sit to Stand 6: Modified independent (Device/Increase time);With upper extremity assist   Five time sit to stand comments  17 seconds   Stand to Sit 6: Modified independent (Device/Increase time);With upper extremity assist     Ambulation/Gait   Ambulation/Gait Yes   Ambulation/Gait Assistance 6: Modified independent (Device/Increase time)  Ambulation Distance (Feet) 100 Feet   Assistive device Straight cane   Gait Pattern Step-through pattern;Decreased stride length;Decreased trunk rotation;Trunk flexed     Balance   Balance Assessed Yes     Standardized Balance Assessment   Standardized Balance Assessment Timed Up and Go Test     Timed Up and Go Test   TUG Normal TUG   Normal TUG (seconds) 16                             PT Education - 11/25/16 1301    Education provided Yes   Education Details HEP: seated strength   Person(s) Educated Patient   Methods Explanation;Demonstration;Handout   Comprehension Verbalized understanding;Returned demonstration          PT Short Term Goals - 11/25/16 1352      PT SHORT TERM GOAL #1   Title be independent in initial HEP for strength and endurance   Time 4   Period Weeks   Status New     PT SHORT TERM GOAL #2   Title improve LE strength to perform sit to stand with minimal UE support   Time 4   Period Weeks   Status New     PT SHORT TERM GOAL #3   Title perfrom 5x sit to stand in < or = to 14 seconds   Time 4   Period Weeks   Status New     PT SHORT TERM GOAL #4   Title **           PT Long Term Goals - 11/25/16 1354      PT LONG TERM GOAL #1   Title be independent in advanced HEP   Time 8   Period Weeks   Status New     PT LONG TERM GOAL #2   Title Patient will verbalize fall prevention techiques for the home and community    Time 8   Period Weeks   Status New     PT LONG TERM GOAL #3   Title improve LE strength to perform sit to stand without UE support > or = to 50% of the time   Time 8   Period Weeks   Status New     PT LONG TERM GOAL #4   Title demonstrate decreased fall risk with TUG in < or 13 seconds   Time 8   Period Weeks   Status New     PT LONG TERM GOAL #5   Title perform 5x sit to stand in < or = to 12 seconds   Time 8   Period Weeks   Status New               Plan - 11/25/16 1316    Clinical Impression Statement Pt presents to PT with generalized weakness and deconditioning.  Pt has been recently diagnosed with cancer and will under go whipple surger in ~4 weeks.  Pt will attend PT get stronger prior to surgery.  Pt is a moderate complexity evaluation due to evolving condition, multiple body parts assessed and comorbidities including active cancer and CVA in 2016 that will impact care.  Pt demonstrates weakness in UEs and LEs Lt>Rt, gait abnormality, 5x sit to stand 17 seconds and TUG 16 seconds.  Pt will benefit from skilled PT for conditioning, balance training and strength/endurance to improve safety and outcome after surgery.     Rehab Potential Good  PT Frequency 2x / week   PT Duration 8 weeks   PT Treatment/Interventions ADLs/Self Care Home Management;Functional mobility training;Stair training;Gait training;Therapeutic activities;Therapeutic exercise;Balance training;Neuromuscular re-education;Patient/family education;Manual techniques   PT Next Visit Plan review HEP, strength, endurance, balance   Consulted and Agree with Plan of Care Patient      Patient will benefit from skilled therapeutic intervention in order to improve the following deficits and impairments:  Postural dysfunction, Decreased strength, Decreased mobility, Decreased balance, Decreased activity tolerance, Decreased endurance, Difficulty walking, Abnormal gait  Visit Diagnosis: Muscle weakness  (generalized)  Other abnormalities of gait and mobility       G-Codes - 02-Dec-2016 1304    Functional Assessment Tool Used (Outpatient Only) 5x sit to stand: 17 seconds, TUG 16 seconds   Functional Limitation Mobility: Walking and moving around   Mobility: Walking and Moving Around Current Status [phone]) At least 40 percent but less than 60 percent impaired, limited or restricted   Mobility: Walking and Moving Around Goal Status [phone]) At least 40 percent but less than 60 percent impaired, limited or restricted      Problem List Patient Active Problem List   Diagnosis Date Noted  . 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 Essential hypertension 02/11/2016  . Lumbar stenosis with neurogenic claudication 02/10/2016  . Hyperlipidemia 02/10/2016  . Hypothyroidism 02/10/2016  . Musculoskeletal neck pain 12/13/2014  . Muscular pain 12/13/2014  . Numbness   . Acute CVA (cerebrovascular accident) ([place]) 09/16/2014  . Right renal mass 09/13/2014  . Cerebral infarction due to unspecified mechanism   . CVA (cerebral infarction) 09/11/2014  . Osteoarthritis 09/11/2014  . Postsurgical hypothyroidism 09/11/2014  . Uncontrolled hypertension 09/11/2014  . History of CVA (cerebral vascular accident) ([place]) 09/11/2014  . Diabetes mellitus without complication ([place]) 41/96/2229  . Dyslipidemia 04/20/2007     Sigurd Sos, PT 2016-12-02 2:01 PM  [facility] [address] [place], [place], [address] Phone: [phone]   Fax:  [phone]  Name: Kaylee Mullins MRN: [id number] Date of Birth: February 14, 1936

## 2016-11-27 ENCOUNTER — Ambulatory Visit: Payer: Medicare Other | Admitting: Physical Therapy

## 2016-11-27 ENCOUNTER — Encounter: Payer: Self-pay | Admitting: Physical Therapy

## 2016-11-27 DIAGNOSIS — R29898 Other symptoms and signs involving the musculoskeletal system: Secondary | ICD-10-CM

## 2016-11-27 DIAGNOSIS — M6281 Muscle weakness (generalized): Secondary | ICD-10-CM

## 2016-11-27 DIAGNOSIS — R2689 Other abnormalities of gait and mobility: Secondary | ICD-10-CM

## 2016-11-27 NOTE — Therapy (Signed)
[facility] [address] [place], [place], [address] Phone: [phone]   Fax:  [phone]  Physical Therapy Treatment  Patient Details  Name: Kaylee Mullins MRN: [id number] Date of Birth: 1935/10/15 Referring Provider: Stark Klein, MD  Encounter Date: 11/27/2016      PT End of Session - 11/27/16 1023    Visit Number 2   Number of Visits 10   Date for PT Re-Evaluation 01/20/17   PT Start Time 4920   PT Stop Time 1057   PT Time Calculation (min) 39 min   Activity Tolerance Patient tolerated treatment well   Behavior During Therapy [facility] for tasks assessed/performed      Past Medical History:  Diagnosis Date  . Arthritis    knees  . Black tarry stools    05-14-16 negative for occult blood with ER visit- noted in [place].  . Cancer [facility])    thyroid cancer- surgery and radiation  . Chronic kidney disease    questionable mass on kidney. Being followed by Dr Diona Fanti  . Complication of anesthesia    heart rate was really low  . Diabetes mellitus    type 2  . Full dentures   . Hypertension   . Hypothyroidism   . Pneumonia   . Spinal stenosis   . Stroke [facility] 09/2014   left sided weakness    Past Surgical History:  Procedure Laterality Date  . ABDOMINAL HYSTERECTOMY     partial  . cataracts     Removed  11/2015  bilateral  . COLONOSCOPY W/ POLYPECTOMY    . ERCP N/A 05/07/2016   Procedure: ENDOSCOPIC RETROGRADE CHOLANGIOPANCREATOGRAPHY (ERCP);  Surgeon: Clarene Essex, MD;  Location: [facility];  Service: Endoscopy;  Laterality: N/A;  . ERCP N/A 10/16/2016   Procedure: ENDOSCOPIC RETROGRADE CHOLANGIOPANCREATOGRAPHY (ERCP);  Surgeon: Clarene Essex, MD;  Location: [facility] ENDOSCOPY;  Service: Endoscopy;  Laterality: N/A;  . EUS N/A 05/27/2016   Procedure: ESOPHAGEAL ENDOSCOPIC ULTRASOUND (EUS) RADIAL;  Surgeon: Arta Silence, MD;  Location[facility] ENDOSCOPY;  Service: Endoscopy;  Laterality: N/A;  . FLEXIBLE  SIGMOIDOSCOPY  03/29/2012   Procedure: FLEXIBLE SIGMOIDOSCOPY;  Surgeon: Jeryl Columbia, MD;  Location[facility] ENDOSCOPY;  Service: Endoscopy;  Laterality: N/A;  fleet enema upon arrival  . HOT HEMOSTASIS  03/29/2012   Procedure: HOT HEMOSTASIS (ARGON PLASMA COAGULATION/BICAP);  Surgeon: Jeryl Columbia, MD;  Location: [facility] ENDOSCOPY;  Service: Endoscopy;  Laterality: N/A;  . IR GENERIC HISTORICAL  06/30/2016   IR RADIOLOGIST EVAL & MGMT 06/30/2016 Aletta Edouard, MD [facility] INTERV RAD  . IR GENERIC HISTORICAL  09/09/2016   IR RADIOLOGIST EVAL & MGMT 09/09/2016 Aletta Edouard, MD [facility] INTERV RAD  . LUMBAR LAMINECTOMY/DECOMPRESSION MICRODISCECTOMY N/A 02/10/2016   Procedure: Lumbar three- four Laminectomy;  Surgeon: Kristeen Miss, MD;  Location: [place] NEURO ORS;  Service: Neurosurgery;  Laterality: N/A;  L3-4 Laminectomy  . LUMBAR SPINE SURGERY     1st surgery "ray cage placed"  . THYROIDECTOMY  2005  . TONSILLECTOMY      There were no vitals filed for this visit.      Subjective Assessment - 11/27/16 1021    Subjective Left hip and lower rib pain that is there all the time and I just don't pay it any mind.  I have been doing the exercises at home at least 2x/day and I can feel it.   Pertinent History Cancer- bile duct (whipple surgery in 4 weeks), thyroid cancer (remission), CVA 2016  Limitations Standing;Walking   How long can you stand comfortably? 10 minutes   How long can you walk comfortably? "I don't do a lot of walking"- 5 minutes at a time, uses motorized scooter in grocery store   Patient Stated Goals improve strength and endurance prior to surgery   Currently in Pain? Yes   Pain Score 2    Pain Location Back   Pain Orientation Left;Lower   Pain Type Chronic pain   Pain Onset More than a month ago   Pain Frequency Constant   Multiple Pain Sites No                         [facility] Adult PT Treatment/Exercise - 11/27/16 0001      Knee/Hip Exercises: Aerobic   Nustep L1  x 8   min     Knee/Hip Exercises: Seated   Long Arc Quad Strengthening;Both;20 reps;Weights  1   Ball Squeeze 20x 5 sec   Other Seated Knee/Hip Exercises heel/toe raises   Marching Strengthening;Both;20 reps;Weights  1#   Abduction/Adduction  Strengthening;Both;20 reps  red band     Shoulder Exercises: Seated   Row Strengthening;Both;20 reps;Theraband   Theraband Level (Shoulder Row) Level 2 (Red)   Horizontal ABduction Strengthening;Both;20 reps;Theraband   Theraband Level (Shoulder Horizontal ABduction) Level 1 (Yellow)   External Rotation Strengthening;Both;20 reps;Theraband     Shoulder Exercises: ROM/Strengthening   UBE (Upper Arm Bike) L2 3x3                  PT Short Term Goals - 11/27/16 1538      PT SHORT TERM GOAL #1   Title be independent in initial HEP for strength and endurance   Time 4   Period Weeks   Status On-going     PT SHORT TERM GOAL #2   Title improve LE strength to perform sit to stand with minimal UE support   Time 4   Period Weeks   Status On-going     PT SHORT TERM GOAL #3   Title perfrom 5x sit to stand in < or = to 14 seconds   Time 4   Period Weeks   Status On-going           PT Long Term Goals - 11/25/16 1354      PT LONG TERM GOAL #1   Title be independent in advanced HEP   Time 8   Period Weeks   Status New     PT LONG TERM GOAL #2   Title Patient will verbalize fall prevention techiques for the home and community   Time 8   Period Weeks   Status New     PT LONG TERM GOAL #3   Title improve LE strength to perform sit to stand without UE support > or = to 50% of the time   Time 8   Period Weeks   Status New     PT LONG TERM GOAL #4   Title demonstrate decreased fall risk with TUG in < or 13 seconds   Time 8   Period Weeks   Status New     PT LONG TERM GOAL #5   Title perform 5x sit to stand in < or = to 12 seconds   Time 8   Period Weeks   Status New               Plan - 11/27/16 1024  Clinical Impression Statement Pt demonstrated good posture during seated exercises and was able to perform with minimal rest time.  Pt states that she did not feel tired at the end of the session but could feel like she did something since she has not been able to do much prior to this.  No goals met since this is first treatment since evaluation.  Pt continue to need skilled PT in order to improve strength and endurance prior to surgery.   Rehab Potential Good   PT Treatment/Interventions ADLs/Self Care Home Management;Functional mobility training;Stair training;Gait training;Therapeutic activities;Therapeutic exercise;Balance training;Neuromuscular re-education;Patient/family education;Manual techniques   PT Next Visit Plan strength, endurance, balance; progress to standing and add sit to stand   Consulted and Agree with Plan of Care Patient      Patient will benefit from skilled therapeutic intervention in order to improve the following deficits and impairments:  Postural dysfunction, Decreased strength, Decreased mobility, Decreased balance, Decreased activity tolerance, Decreased endurance, Difficulty walking, Abnormal gait  Visit Diagnosis: Muscle weakness (generalized)  Other abnormalities of gait and mobility  Weakness of left lower extremity     Problem List Patient Active Problem List   Diagnosis Date Noted  . 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 Essential hypertension 02/11/2016  . Lumbar stenosis with neurogenic claudication 02/10/2016  . Hyperlipidemia 02/10/2016  . Hypothyroidism 02/10/2016  . Musculoskeletal neck pain 12/13/2014  . Muscular pain 12/13/2014  . Numbness   . Acute CVA (cerebrovascular accident) ([place]) 09/16/2014  . Right renal mass 09/13/2014  . Cerebral infarction due to unspecified mechanism   . CVA (cerebral infarction) 09/11/2014  . Osteoarthritis 09/11/2014  . Postsurgical hypothyroidism 09/11/2014  . Uncontrolled hypertension 09/11/2014  .  History of CVA (cerebral vascular accident) ([place]) 09/11/2014  . Diabetes mellitus without complication ([place]) 06/16/2810  . Dyslipidemia 04/20/2007    Zannie Cove, PT 11/27/2016, 3:42 PM  [facility] [address], [place], [place], [address] Phone: [phone]   Fax:  [phone]  Name: ADAYA GARMANY MRN: [id number] Date of Birth: 01/12/1936

## 2016-12-01 ENCOUNTER — Ambulatory Visit: Payer: Medicare Other | Admitting: Physical Therapy

## 2016-12-01 DIAGNOSIS — M6281 Muscle weakness (generalized): Secondary | ICD-10-CM

## 2016-12-01 DIAGNOSIS — R29898 Other symptoms and signs involving the musculoskeletal system: Secondary | ICD-10-CM

## 2016-12-01 DIAGNOSIS — R2689 Other abnormalities of gait and mobility: Secondary | ICD-10-CM

## 2016-12-01 NOTE — Patient Instructions (Signed)
[name] PT [facility] [address] [place], [place] [address] Phone # [phone] Fax [phone]    

## 2016-12-01 NOTE — Therapy (Signed)
[facility] [address] [place], [place], [address] Phone: [phone]   Fax:  [phone]  Physical Therapy Treatment  Patient Details  Name: Kaylee Mullins MRN: [id number] Date of Birth: October 28, 1935 Referring Provider: Stark Klein, MD  Encounter Date: 12/01/2016      PT End of Session - 12/01/16 1202    Visit Number 3   Number of Visits 10   Date for PT Re-Evaluation 01/20/17   PT Start Time 1093   PT Stop Time 1230   PT Time Calculation (min) 45 min   Activity Tolerance Patient tolerated treatment well      Past Medical History:  Diagnosis Date  . Arthritis    knees  . Black tarry stools    05-14-16 negative for occult blood with ER visit- noted in [place].  . Cancer [facility])    thyroid cancer- surgery and radiation  . Chronic kidney disease    questionable mass on kidney. Being followed by Dr Diona Fanti  . Complication of anesthesia    heart rate was really low  . Diabetes mellitus    type 2  . Full dentures   . Hypertension   . Hypothyroidism   . Pneumonia   . Spinal stenosis   . Stroke [facility]) 09/2014   left sided weakness    Past Surgical History:  Procedure Laterality Date  . ABDOMINAL HYSTERECTOMY     partial  . cataracts     Removed  11/2015  bilateral  . COLONOSCOPY W/ POLYPECTOMY    . ERCP N/A 05/07/2016   Procedure: ENDOSCOPIC RETROGRADE CHOLANGIOPANCREATOGRAPHY (ERCP);  Surgeon: Clarene Essex, MD;  Location: [facility];  Service: Endoscopy;  Laterality: N/A;  . ERCP N/A 10/16/2016   Procedure: ENDOSCOPIC RETROGRADE CHOLANGIOPANCREATOGRAPHY (ERCP);  Surgeon: Clarene Essex, MD;  Location: [facility] ENDOSCOPY;  Service: Endoscopy;  Laterality: N/A;  . EUS N/A 05/27/2016   Procedure: ESOPHAGEAL ENDOSCOPIC ULTRASOUND (EUS) RADIAL;  Surgeon: Arta Silence, MD;  Location: [facility] ENDOSCOPY;  Service: Endoscopy;  Laterality: N/A;  . FLEXIBLE SIGMOIDOSCOPY  03/29/2012   Procedure: FLEXIBLE SIGMOIDOSCOPY;  Surgeon:  Jeryl Columbia, MD;  Location: [facility] ENDOSCOPY;  Service: Endoscopy;  Laterality: N/A;  fleet enema upon arrival  . HOT HEMOSTASIS  03/29/2012   Procedure: HOT HEMOSTASIS (ARGON PLASMA COAGULATION/BICAP);  Surgeon: Jeryl Columbia, MD;  Location: [facility] ENDOSCOPY;  Service: Endoscopy;  Laterality: N/A;  . IR GENERIC HISTORICAL  06/30/2016   IR RADIOLOGIST EVAL & MGMT 06/30/2016 Aletta Edouard, MD [facility] INTERV RAD  . IR GENERIC HISTORICAL  09/09/2016   IR RADIOLOGIST EVAL & MGMT 09/09/2016 Aletta Edouard, MD [facility] INTERV RAD  . LUMBAR LAMINECTOMY/DECOMPRESSION MICRODISCECTOMY N/A 02/10/2016   Procedure: Lumbar three- four Laminectomy;  Surgeon: Kristeen Miss, MD;  Location: [place] NEURO ORS;  Service: Neurosurgery;  Laterality: N/A;  L3-4 Laminectomy  . LUMBAR SPINE SURGERY     1st surgery "ray cage placed"  . THYROIDECTOMY  2005  . TONSILLECTOMY      There were no vitals filed for this visit.      Subjective Assessment - 12/01/16 1147    Subjective Patient reports some minor soreness after last session.  "I have arthritis in my knees so they always hurt."   Currently in Pain? Yes   Pain Score 2    Pain Orientation Left   Pain Type Chronic pain  [place] [facility] PT Treatment/Exercise - 12/01/16 0001      Therapeutic Activites    Therapeutic Activities Other Therapeutic Activities   Other Therapeutic Activities sit to stand, walking and standing      Neuro Re-ed    Neuro Re-ed Details  weight shifting, dynamic balance     Knee/Hip Exercises: Aerobic   Nustep L1  x 8  min     Knee/Hip Exercises: Standing   Heel Raises Both;10 reps   Hip ADduction AROM;Right;Left;10 reps   Hip Extension AROM;Right;Left;10 reps   Other Standing Knee Exercises step taps 10x  holding treadmill bar     Knee/Hip Exercises: Seated   Long Arc Quad Strengthening;Right;Left;10 reps   Long Arc Quad Limitations red band   Ball Squeeze 20x 5 sec   Clamshell with TheraBand Red  20x    Other Seated Knee/Hip Exercises red band hip flexion/ankle dorsiflex 10x each   Sit to [employer] 10 reps                PT Education - 12/01/16 1208    Education provided Yes   Education Details seated red band ex   Person(s) Educated Patient   Methods Explanation;Demonstration;Handout   Comprehension Verbalized understanding;Returned demonstration          PT Short Term Goals - 12/01/16 1417      PT SHORT TERM GOAL #1   Title be independent in initial HEP for strength and endurance   Time 4   Period Weeks   Status On-going     PT SHORT TERM GOAL #2   Title improve LE strength to perform sit to stand with minimal UE support   Status Achieved     PT SHORT TERM GOAL #3   Title perfrom 5x sit to stand in < or = to 14 seconds   Time 4   Period Weeks   Status On-going           PT Long Term Goals - 12/01/16 1418      PT LONG TERM GOAL #1   Title be independent in advanced HEP   Time 8   Period Weeks   Status On-going     PT LONG TERM GOAL #2   Title Patient will verbalize fall prevention techiques for the home and community   Time 8   Period Weeks   Status On-going     PT LONG TERM GOAL #3   Title improve LE strength to perform sit to stand without UE support > or = to 50% of the time   Time 8   Period Weeks   Status On-going     PT LONG TERM GOAL #4   Title demonstrate decreased fall risk with TUG in < or 13 seconds   Time 8   Period Weeks   Status On-going     PT LONG TERM GOAL #5   Title perform 5x sit to stand in < or = to 12 seconds   Time 8   Period Weeks   Status On-going               Plan - 12/01/16 1210    Clinical Impression Statement The patient is able to participate in LE strengthening exercises without an increase in pain but with general fatigue reported.  Two seated rest breaks needed between standing ex's.  Right foot tends to externally rotate and difficulty full weightbearing secondary to weakness from CVA.      Rehab Potential Good  PT Frequency 2x / week   PT Duration 8 weeks   PT Treatment/Interventions ADLs/Self Care Home Management;Functional mobility training;Stair training;Gait training;Therapeutic activities;Therapeutic exercise;Balance training;Neuromuscular re-education;Patient/family education;Manual techniques   PT Next Visit Plan strength, endurance, balance; standing and add sit to stand to HEP      Patient will benefit from skilled therapeutic intervention in order to improve the following deficits and impairments:  Postural dysfunction, Decreased strength, Decreased mobility, Decreased balance, Decreased activity tolerance, Decreased endurance, Difficulty walking, Abnormal gait  Visit Diagnosis: Muscle weakness (generalized)  Other abnormalities of gait and mobility  Weakness of left lower extremity     Problem List Patient Active Problem List   Diagnosis Date Noted  . 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 Essential hypertension 02/11/2016  . Lumbar stenosis with neurogenic claudication 02/10/2016  . Hyperlipidemia 02/10/2016  . Hypothyroidism 02/10/2016  . Musculoskeletal neck pain 12/13/2014  . Muscular pain 12/13/2014  . Numbness   . Acute CVA (cerebrovascular accident) ([place]) 09/16/2014  . Right renal mass 09/13/2014  . Cerebral infarction due to unspecified mechanism   . CVA (cerebral infarction) 09/11/2014  . Osteoarthritis 09/11/2014  . Postsurgical hypothyroidism 09/11/2014  . Uncontrolled hypertension 09/11/2014  . History of CVA (cerebral vascular accident) ([place]) 09/11/2014  . Diabetes mellitus without complication ([place]) 01/01/3357  . Dyslipidemia 04/20/2007   Ruben Im, PT 12/01/16 2:20 PM Phone: [phone] Fax: [phone]  Alvera Singh 12/01/2016, 2:19 PM  [facility] [address] [place], [place], [address] Phone: [phone]   Fax:  [phone]  Name: Kaylee Mullins MRN: [id number] Date of Birth: Aug 07, 1936

## 2016-12-02 ENCOUNTER — Encounter: Payer: Medicare Other | Admitting: Nutrition

## 2016-12-02 ENCOUNTER — Ambulatory Visit: Payer: Medicare Other | Admitting: Nutrition

## 2016-12-02 NOTE — Progress Notes (Signed)
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  She is a patient of Dr. [name].  She is considering a Whipple procedure.  Past medical history includes renal cell cancer in 2017, CVA, thyroid cancer, diabetes, hypertension.  Medications include vitamin B12, Lasix, Humalog, Synthroid, and Crestor.  Labs include albumin 3.5.  Height: 64 inches. Weight: 210.8 pounds April 18. Usual body weight: 240 pounds June 2017. BMI: 36.18.  Estimated nutrition needs: 2000-2200 calories, 110-120 grams protein, 2 L fluid daily.  Patient denies nutrition impact symptoms but does report poor appetite. Reports CBG ranges from 56 to 130s fasting. She has lactose intolerance so has recently changed to lactaid milk. Complains of increased gas, which is uncontrolled. Noted lower leg extremity edema. 24 hour Dietary recall reveals patient consumed approximately 600 cal yesterday however reports this is normal eating pattern for her. Patient has not tried any oral nutrition supplements but is willing  Nutrition diagnosis:  Inadequate oral intake related to poor appetite as evidenced by estimated calorie intake less than estimated needs.  Intervention: Educated patient on strategies for reducing gas and provided fact sheet. Encouraged patient to increase calories and proteins at mealtimes and provided fact sheet. Recommended patient add oral nutrition supplements twice a day and provided samples and coupons. Questions were answered.  Teach back method used.  Contact information provided.  Monitoring, evaluation, goals: Patient will tolerate increased calories and protein to provide increased strength and improve muscle mass.  Next visit: Patient will contact me for questions or concerns.  **Disclaimer: This note was dictated with voice recognition software. Similar sounding words can inadvertently be transcribed and this note may contain transcription errors which may not have been corrected  upon publication of note.**

## 2016-12-03 ENCOUNTER — Ambulatory Visit: Payer: Medicare Other | Admitting: Physical Therapy

## 2016-12-03 DIAGNOSIS — R29898 Other symptoms and signs involving the musculoskeletal system: Secondary | ICD-10-CM

## 2016-12-03 DIAGNOSIS — M6281 Muscle weakness (generalized): Secondary | ICD-10-CM

## 2016-12-03 DIAGNOSIS — R2689 Other abnormalities of gait and mobility: Secondary | ICD-10-CM

## 2016-12-03 NOTE — Therapy (Signed)
[facility] [address] [place], [place], [address] Phone: [phone]   Fax:  [phone]  Physical Therapy Treatment  Patient Details  Name: Kaylee Mullins MRN: [id number] Date of Birth: 11-Dec-1935 Referring Provider: Stark Klein, MD  Encounter Date: 12/03/2016      PT End of Session - 12/03/16 1107    Visit Number 4   Number of Visits 10   Date for PT Re-Evaluation 01/20/17   PT Start Time 1100   PT Stop Time 1144   PT Time Calculation (min) 44 min   Activity Tolerance Patient tolerated treatment well      Past Medical History:  Diagnosis Date  . Arthritis    knees  . Black tarry stools    05-14-16 negative for occult blood with ER visit- noted in [place].  . Cancer [facility])    thyroid cancer- surgery and radiation  . Chronic kidney disease    questionable mass on kidney. Being followed by Dr Diona Fanti  . Complication of anesthesia    heart rate was really low  . Diabetes mellitus    type 2  . Full dentures   . Hypertension   . Hypothyroidism   . Pneumonia   . Spinal stenosis   . Stroke [facility]) 09/2014   left sided weakness    Past Surgical History:  Procedure Laterality Date  . ABDOMINAL HYSTERECTOMY     partial  . cataracts     Removed  11/2015  bilateral  . COLONOSCOPY W/ POLYPECTOMY    . ERCP N/A 05/07/2016   Procedure: ENDOSCOPIC RETROGRADE CHOLANGIOPANCREATOGRAPHY (ERCP);  Surgeon: Clarene Essex, MD;  Location: [facility];  Service: Endoscopy;  Laterality: N/A;  . ERCP N/A 10/16/2016   Procedure: ENDOSCOPIC RETROGRADE CHOLANGIOPANCREATOGRAPHY (ERCP);  Surgeon: Clarene Essex, MD;  Location[facility] ENDOSCOPY;  Service: Endoscopy;  Laterality: N/A;  . EUS N/A 05/27/2016   Procedure: ESOPHAGEAL ENDOSCOPIC ULTRASOUND (EUS) RADIAL;  Surgeon: Arta Silence, MD;  Location: [facility] ENDOSCOPY;  Service: Endoscopy;  Laterality: N/A;  . FLEXIBLE SIGMOIDOSCOPY  03/29/2012   Procedure: FLEXIBLE SIGMOIDOSCOPY;  Surgeon:  Jeryl Columbia, MD;  Location: [facility] ENDOSCOPY;  Service: Endoscopy;  Laterality: N/A;  fleet enema upon arrival  . HOT HEMOSTASIS  03/29/2012   Procedure: HOT HEMOSTASIS (ARGON PLASMA COAGULATION/BICAP);  Surgeon: Jeryl Columbia, MD;  Location: [facility] ENDOSCOPY;  Service: Endoscopy;  Laterality: N/A;  . IR GENERIC HISTORICAL  06/30/2016   IR RADIOLOGIST EVAL & MGMT 06/30/2016 Aletta Edouard, MD [facility] INTERV RAD  . IR GENERIC HISTORICAL  09/09/2016   IR RADIOLOGIST EVAL & MGMT 09/09/2016 Aletta Edouard, MD [facility] INTERV RAD  . LUMBAR LAMINECTOMY/DECOMPRESSION MICRODISCECTOMY N/A 02/10/2016   Procedure: Lumbar three- four Laminectomy;  Surgeon: Kristeen Miss, MD;  Location: [place] NEURO ORS;  Service: Neurosurgery;  Laterality: N/A;  L3-4 Laminectomy  . LUMBAR SPINE SURGERY     1st surgery "ray cage placed"  . THYROIDECTOMY  2005  . TONSILLECTOMY      There were no vitals filed for this visit.      Subjective Assessment - 12/03/16 1100    Subjective I feel it in my knees but I was always do b/c of arthritis.  Reports some soreness in her thighs, buttocks and arms following last treatment session.   Currently in Pain? Yes   Pain Score 5    Pain Location Knee   Pain Orientation Right;Left  [facility] Adult PT Treatment/Exercise - 12/03/16 0001      Therapeutic Activites    Other Therapeutic Activities sit to stand, walking and standing      Neuro Re-ed    Neuro Re-ed Details  weight shifting, dynamic balance     Lumbar Exercises: Seated   Other Seated Lumbar Exercises ball core: hip to hip, hip to shoulder, Vs, ear to ear 2# plyo ball 30 sec each     Knee/Hip Exercises: Stretches   Active Hamstring Stretch Right;Left;30 seconds   Active Hamstring Stretch Limitations seated     Knee/Hip Exercises: Aerobic   Nustep L1  x 10  min     Knee/Hip Exercises: Standing   Heel Raises Both;10 reps   Hip ADduction AROM;Right;Left;10 reps   Hip Extension  AROM;Right;Left;10 reps   Other Standing Knee Exercises step taps 10x  holding treadmill bar     Knee/Hip Exercises: Seated   Long Arc Quad Strengthening;Right;Left;10 reps   Long Arc Quad Limitations red band   Ball Squeeze 20x 5 sec   Clamshell with TheraBand Red  20x   Other Seated Knee/Hip Exercises red band hip flexion/ankle dorsiflex 10x each   Sit to [employer] 10 reps                  PT Short Term Goals - 12/03/16 1139      PT SHORT TERM GOAL #1   Title be independent in initial HEP for strength and endurance   Time 4   Period Weeks   Status On-going     PT SHORT TERM GOAL #2   Title improve LE strength to perform sit to stand with minimal UE support   Status Achieved     PT SHORT TERM GOAL #3   Title perfrom 5x sit to stand in < or = to 14 seconds   Time 4   Period Weeks   Status On-going           PT Long Term Goals - 12/03/16 1139      PT LONG TERM GOAL #1   Title be independent in advanced HEP   Time 8   Period Weeks   Status On-going     PT LONG TERM GOAL #2   Title Patient will verbalize fall prevention techiques for the home and community   Time 8   Period Weeks   Status On-going     PT LONG TERM GOAL #3   Title improve LE strength to perform sit to stand without UE support > or = to 50% of the time   Time 8   Period Weeks   Status On-going     PT LONG TERM GOAL #4   Title demonstrate decreased fall risk with TUG in < or 13 seconds   Time 8   Period Weeks   Status On-going     PT LONG TERM GOAL #5   Title perform 5x sit to stand in < or = to 12 seconds   Time 8   Period Weeks   Status On-going               Plan - 12/03/16 1107    Clinical Impression Statement The patient continues to be hightly motivated and will often do extra repetitions of exercises.  She demonstrates compliance with previously issue HEP.  She does have reports of arthritic knee pain and reports her shoulders feel like "there is gravel in them."   Improving exercise tolerance.  PT Treatment/Interventions ADLs/Self Care Home Management;Functional mobility training;Stair training;Gait training;Therapeutic activities;Therapeutic exercise;Balance training;Neuromuscular re-education;Patient/family education;Manual techniques   PT Next Visit Plan strength, endurance, balance; standing ex       Patient will benefit from skilled therapeutic intervention in order to improve the following deficits and impairments:  Postural dysfunction, Decreased strength, Decreased mobility, Decreased balance, Decreased activity tolerance, Decreased endurance, Difficulty walking, Abnormal gait  Visit Diagnosis: Muscle weakness (generalized)  Other abnormalities of gait and mobility  Weakness of left lower extremity     Problem List Patient Active Problem List   Diagnosis Date Noted  . 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 Essential hypertension 02/11/2016  . Lumbar stenosis with neurogenic claudication 02/10/2016  . Hyperlipidemia 02/10/2016  . Hypothyroidism 02/10/2016  . Musculoskeletal neck pain 12/13/2014  . Muscular pain 12/13/2014  . Numbness   . Acute CVA (cerebrovascular accident) ([place]) 09/16/2014  . Right renal mass 09/13/2014  . Cerebral infarction due to unspecified mechanism   . CVA (cerebral infarction) 09/11/2014  . Osteoarthritis 09/11/2014  . Postsurgical hypothyroidism 09/11/2014  . Uncontrolled hypertension 09/11/2014  . History of CVA (cerebral vascular accident) ([place]) 09/11/2014  . Diabetes mellitus without complication ([place]) 29/51/8841  . Dyslipidemia 04/20/2007   Ruben Im, PT 12/03/16 11:45 AM Phone: [phone] Fax: [phone]  Alvera Singh 12/03/2016, 11:44 AM  [facility] [address] [place], [place], [address] Phone: [phone]   Fax:  [phone]  Name: Kaylee Mullins MRN: [id number] Date of Birth: 16-Jan-1936

## 2016-12-08 ENCOUNTER — Ambulatory Visit: Payer: Medicare Other | Admitting: Physical Therapy

## 2016-12-08 DIAGNOSIS — R29898 Other symptoms and signs involving the musculoskeletal system: Secondary | ICD-10-CM

## 2016-12-08 DIAGNOSIS — M6281 Muscle weakness (generalized): Secondary | ICD-10-CM

## 2016-12-08 DIAGNOSIS — R2689 Other abnormalities of gait and mobility: Secondary | ICD-10-CM

## 2016-12-08 NOTE — Therapy (Signed)
[facility] [address] [place], [place], [address] Phone: [phone]   Fax:  [phone]  Physical Therapy Treatment  Patient Details  Name: Kaylee Mullins MRN: [id number] Date of Birth: 01-10-1936 Referring Provider: Stark Klein, MD  Encounter Date: 12/08/2016      PT End of Session - 12/08/16 1213    Visit Number 5   Number of Visits 10   Date for PT Re-Evaluation 01/20/17   PT Start Time 9417   PT Stop Time 1230   PT Time Calculation (min) 45 min   Activity Tolerance Patient tolerated treatment well      Past Medical History:  Diagnosis Date  . Arthritis    knees  . Black tarry stools    05-14-16 negative for occult blood with ER visit- noted in [place].  . Cancer [facility])    thyroid cancer- surgery and radiation  . Chronic kidney disease    questionable mass on kidney. Being followed by Dr Diona Fanti  . Complication of anesthesia    heart rate was really low  . Diabetes mellitus    type 2  . Full dentures   . Hypertension   . Hypothyroidism   . Pneumonia   . Spinal stenosis   . Stroke [facility]) 09/2014   left sided weakness    Past Surgical History:  Procedure Laterality Date  . ABDOMINAL HYSTERECTOMY     partial  . cataracts     Removed  11/2015  bilateral  . COLONOSCOPY W/ POLYPECTOMY    . ERCP N/A 05/07/2016   Procedure: ENDOSCOPIC RETROGRADE CHOLANGIOPANCREATOGRAPHY (ERCP);  Surgeon: Clarene Essex, MD;  [facility];  Service: Endoscopy;  Laterality: N/A;  . ERCP N/A 10/16/2016   Procedure: ENDOSCOPIC RETROGRADE CHOLANGIOPANCREATOGRAPHY (ERCP);  Surgeon: Clarene Essex, MD;  Location: [facility] ENDOSCOPY;  Service: Endoscopy;  Laterality: N/A;  . EUS N/A 05/27/2016   Procedure: ESOPHAGEAL ENDOSCOPIC ULTRASOUND (EUS) RADIAL;  Surgeon: Arta Silence, MD;  [facility] ENDOSCOPY;  Service: Endoscopy;  Laterality: N/A;  . FLEXIBLE SIGMOIDOSCOPY  03/29/2012   Procedure: FLEXIBLE SIGMOIDOSCOPY;  Surgeon:  Jeryl Columbia, MD;  Location[facility] ENDOSCOPY;  Service: Endoscopy;  Laterality: N/A;  fleet enema upon arrival  . HOT HEMOSTASIS  03/29/2012   Procedure: HOT HEMOSTASIS (ARGON PLASMA COAGULATION/BICAP);  Surgeon: Jeryl Columbia, MD;  Location: [facility] ENDOSCOPY;  Service: Endoscopy;  Laterality: N/A;  . IR GENERIC HISTORICAL  06/30/2016   IR RADIOLOGIST EVAL & MGMT 06/30/2016 Aletta Edouard, MD [facility] INTERV RAD  . IR GENERIC HISTORICAL  09/09/2016   IR RADIOLOGIST EVAL & MGMT 09/09/2016 Aletta Edouard, MD [facility] INTERV RAD  . LUMBAR LAMINECTOMY/DECOMPRESSION MICRODISCECTOMY N/A 02/10/2016   Procedure: Lumbar three- four Laminectomy;  Surgeon: Kristeen Miss, MD;  Location: [facility] NEURO ORS;  Service: Neurosurgery;  Laterality: N/A;  L3-4 Laminectomy  . LUMBAR SPINE SURGERY     1st surgery "ray cage placed"  . THYROIDECTOMY  2005  . TONSILLECTOMY      There were no vitals filed for this visit.      Subjective Assessment - 12/08/16 1148    Subjective I did fine after last time.  My knees are kind of sore from doing a lot of stairs last night.  Also some left shoulder pain from laying on it wrong last night and with using the band.     Pertinent History Cancer- bile duct (whipple surgery in 4 weeks), thyroid cancer (remission), CVA 2016   Currently in Pain? Yes  Pain Score 4    Pain Location Knee   Pain Orientation Right;Left   Pain Type Chronic pain   Pain Onset More than a month ago   Pain Frequency Constant            [facility] PT Assessment - 12/08/16 0001      Balance   Balance Assessed --  sit to stand 21 sec 5x                     [facility] Adult PT Treatment/Exercise - 12/08/16 0001      Therapeutic Activites    Other Therapeutic Activities sit to stand, walking and standing      Neuro Re-ed    Neuro Re-ed Details  weight shifting, dynamic balance     Knee/Hip Exercises: Aerobic   Nustep L1  x 10  min     Knee/Hip Exercises: Standing   Heel Raises Both;10 reps   Hip  ADduction AROM;Right;Left;10 reps   Hip ADduction Limitations --   Hip Extension AROM;Right;Left;10 reps   Extension Limitations red band   Lateral Step Up Right;Left;5 reps;Hand Hold: 2;Step Height: 6"   Forward Step Up Right;Left;5 reps   Other Standing Knee Exercises step taps 10x  holding treadmill bar     Knee/Hip Exercises: Seated   Ball Squeeze 20x 5 sec   Clamshell with TheraBand Green   Abd/Adduction Limitations foam roll push downs 10x   Sit to [employer] 10 reps                  PT Short Term Goals - 12/08/16 1225      PT SHORT TERM GOAL #1   Status Achieved     PT SHORT TERM GOAL #2   Title improve LE strength to perform sit to stand with minimal UE support   Status Achieved     PT SHORT TERM GOAL #3   Title perfrom 5x sit to stand in < or = to 14 seconds   Status On-going           PT Long Term Goals - 12/08/16 1228      PT LONG TERM GOAL #1   Title be independent in advanced HEP   Time 8   Period Weeks   Status On-going     PT LONG TERM GOAL #2   Title Patient will verbalize fall prevention techiques for the home and community   Time 8   Period Weeks   Status On-going     PT LONG TERM GOAL #3   Title improve LE strength to perform sit to stand without UE support > or = to 50% of the time   Time 8   Period Weeks   Status On-going     PT LONG TERM GOAL #4   Title demonstrate decreased fall risk with TUG in < or 13 seconds   Time 8   Period Weeks   Status On-going     PT LONG TERM GOAL #5   Title perform 5x sit to stand in < or = to 12 seconds   Time 8   Period Weeks   Status On-going               Plan - 12/08/16 1215    Clinical Impression Statement The patient is improving with standing endurance.  Difficulty adding resistance to standing hip extension secondary to lack of motor control.  Difficulty with lateral step ups as well right more so  than left.  Therapist closely monitoring response including knee pain and  modifying as needed.     PT Treatment/Interventions ADLs/Self Care Home Management;Functional mobility training;Stair training;Gait training;Therapeutic activities;Therapeutic exercise;Balance training;Neuromuscular re-education;Patient/family education;Manual techniques   PT Next Visit Plan strength, endurance, balance; standing ex       Patient will benefit from skilled therapeutic intervention in order to improve the following deficits and impairments:  Postural dysfunction, Decreased strength, Decreased mobility, Decreased balance, Decreased activity tolerance, Decreased endurance, Difficulty walking, Abnormal gait  Visit Diagnosis: Muscle weakness (generalized)  Other abnormalities of gait and mobility  Weakness of left lower extremity     Problem List Patient Active Problem List   Diagnosis Date Noted  . 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 Essential hypertension 02/11/2016  . Lumbar stenosis with neurogenic claudication 02/10/2016  . Hyperlipidemia 02/10/2016  . Hypothyroidism 02/10/2016  . Musculoskeletal neck pain 12/13/2014  . Muscular pain 12/13/2014  . Numbness   . Acute CVA (cerebrovascular accident) ([place]) 09/16/2014  . Right renal mass 09/13/2014  . Cerebral infarction due to unspecified mechanism   . CVA (cerebral infarction) 09/11/2014  . Osteoarthritis 09/11/2014  . Postsurgical hypothyroidism 09/11/2014  . Uncontrolled hypertension 09/11/2014  . History of CVA (cerebral vascular accident) ([place]) 09/11/2014  . Diabetes mellitus without complication ([place]) 31/49/7026  . Dyslipidemia 04/20/2007   Ruben Im, PT 12/08/16 1:49 PM Phone: [phone] Fax: [phone]  Alvera Singh 12/08/2016, 1:48 PM  [facility] [address] [place], [place], [address] Phone: [phone]   Fax:  [phone]  Name: Kaylee Mullins MRN: [id number] Date of Birth: 10/12/1935

## 2016-12-10 ENCOUNTER — Ambulatory Visit: Payer: Medicare Other | Admitting: Physical Therapy

## 2016-12-10 DIAGNOSIS — R2689 Other abnormalities of gait and mobility: Secondary | ICD-10-CM

## 2016-12-10 DIAGNOSIS — M6281 Muscle weakness (generalized): Secondary | ICD-10-CM

## 2016-12-10 DIAGNOSIS — R29898 Other symptoms and signs involving the musculoskeletal system: Secondary | ICD-10-CM

## 2016-12-10 NOTE — Therapy (Signed)
[facility] [address], [place], [place], [address] Phone: [phone]   Fax:  [phone]  Physical Therapy Treatment  Patient Details  Name: Kaylee Mullins MRN: [id number] Date of Birth: [birth year] Referring Provider: Stark Klein, MD  Encounter Date: 12/10/2016      PT End of Session - 12/10/16 1157    Visit Number 6   Number of Visits 10   Date for PT Re-Evaluation 01/20/17   PT Start Time 1150   PT Stop Time 1230   PT Time Calculation (min) 40 min   Activity Tolerance Patient tolerated treatment well      Past Medical History:  Diagnosis Date  . Arthritis    knees  . Black tarry stools    05-14-16 negative for occult blood with ER visit- noted in [place].  . Cancer [facility])    thyroid cancer- surgery and radiation  . Chronic kidney disease    questionable mass on kidney. Being followed by Dr Diona Fanti  . Complication of anesthesia    heart rate was really low  . Diabetes mellitus    type 2  . Full dentures   . Hypertension   . Hypothyroidism   . Pneumonia   . Spinal stenosis   . Stroke [facility]) 09/2014   left sided weakness    Past Surgical History:  Procedure Laterality Date  . ABDOMINAL HYSTERECTOMY     partial  . cataracts     Removed  11/2015  bilateral  . COLONOSCOPY W/ POLYPECTOMY    . ERCP N/A 05/07/2016   Procedure: ENDOSCOPIC RETROGRADE CHOLANGIOPANCREATOGRAPHY (ERCP);  Surgeon: Clarene Essex, MD;  Location[facility];  Service: Endoscopy;  Laterality: N/A;  . ERCP N/A 10/16/2016   Procedure: ENDOSCOPIC RETROGRADE CHOLANGIOPANCREATOGRAPHY (ERCP);  Surgeon: Clarene Essex, MD;  Location[facility] ENDOSCOPY;  Service: Endoscopy;  Laterality: N/A;  . EUS N/A 05/27/2016   Procedure: ESOPHAGEAL ENDOSCOPIC ULTRASOUND (EUS) RADIAL;  Surgeon: Arta Silence, MD;  [facility] ENDOSCOPY;  Service: Endoscopy;  Laterality: N/A;  . FLEXIBLE SIGMOIDOSCOPY  03/29/2012   Procedure: FLEXIBLE SIGMOIDOSCOPY;  Surgeon:  Jeryl Columbia, MD;  Location[facility] ENDOSCOPY;  Service: Endoscopy;  Laterality: N/A;  fleet enema upon arrival  . HOT HEMOSTASIS  03/29/2012   Procedure: HOT HEMOSTASIS (ARGON PLASMA COAGULATION/BICAP);  Surgeon: Jeryl Columbia, MD;  Location: [facility] ENDOSCOPY;  Service: Endoscopy;  Laterality: N/A;  . IR GENERIC HISTORICAL  06/30/2016   IR RADIOLOGIST EVAL & MGMT 06/30/2016 Aletta Edouard, MD [facility] INTERV RAD  . IR GENERIC HISTORICAL  09/09/2016   IR RADIOLOGIST EVAL & MGMT 09/09/2016 Aletta Edouard, MD [facility] INTERV RAD  . LUMBAR LAMINECTOMY/DECOMPRESSION MICRODISCECTOMY N/A 02/10/2016   Procedure: Lumbar three- four Laminectomy;  Surgeon: Kristeen Miss, MD;  Location: [facility] NEURO ORS;  Service: Neurosurgery;  Laterality: N/A;  L3-4 Laminectomy  . LUMBAR SPINE SURGERY     1st surgery "ray cage placed"  . THYROIDECTOMY  2005  . TONSILLECTOMY      There were no vitals filed for this visit.      Subjective Assessment - 12/10/16 1151    Subjective States she had some soreness last time but not too badly.  Right knee pain, left knee is feeling better.  Has cramps sometimes, since she was 81 years old.  Had one yesterday that lasted 20 minutes.     Currently in Pain? Yes   Pain Score 6    Pain Location Knee   Pain Orientation Right;Medial   Pain  Type Chronic pain   Pain Radiating Towards right hip   Aggravating Factors  walking   Pain Relieving Factors getting off my feet                         [facility] Adult PT Treatment/Exercise - 12/10/16 0001      Therapeutic Activites    Other Therapeutic Activities sit to stand, walking and standing      Neuro Re-ed    Neuro Re-ed Details  core muscle activation,     Lumbar Exercises: Seated   Other Seated Lumbar Exercises ball core: hip to hip, hip to shoulder, Vs, ear to ear 2# plyo ball 30 sec each     Knee/Hip Exercises: Stretches   Active Hamstring Stretch Right;Left;30 seconds   Active Hamstring Stretch Limitations seated      Knee/Hip Exercises: Aerobic   Nustep L2  x 10 min     Knee/Hip Exercises: Standing   Heel Raises Both;10 reps   Hip ADduction AROM;Right;Left;10 reps   Hip Extension AROM;Right;Left;10 reps   Extension Limitations --   Lateral Step Up --   Forward Step Up Right;Left;5 reps   Other Standing Knee Exercises step taps 10x  holding treadmill bar   Other Standing Knee Exercises marching 1 minute     Knee/Hip Exercises: Seated   Abd/Adduction Limitations foam roll push downs 10x   Sit to Sand 10 reps                  PT Short Term Goals - 12/10/16 1203      PT SHORT TERM GOAL #1   Title be independent in initial HEP for strength and endurance   Status Achieved     PT SHORT TERM GOAL #2   Title improve LE strength to perform sit to stand with minimal UE support   Status Achieved     PT SHORT TERM GOAL #3   Title perfrom 5x sit to stand in < or = to 14 seconds   Time 4   Period Weeks   Status On-going           PT Long Term Goals - 12/10/16 1206      PT LONG TERM GOAL #1   Title be independent in advanced HEP   Time 8   Period Weeks   Status On-going     PT LONG TERM GOAL #2   Title Patient will verbalize fall prevention techiques for the home and community   Time 8   Period Weeks   Status On-going     PT LONG TERM GOAL #3   Title improve LE strength to perform sit to stand without UE support > or = to 50% of the time   Time 8   Period Weeks   Status On-going     PT LONG TERM GOAL #4   Title demonstrate decreased fall risk with TUG in < or 13 seconds   Period Weeks   Status On-going     PT LONG TERM GOAL #5   Title perform 5x sit to stand in < or = to 12 seconds   Time 8   Period Weeks   Status On-going               Plan - 12/10/16 1159    Clinical Impression Statement Still waiting to find out when her surgery will be.  The patient continues to be highly motivated with exercise.  She  does have hip and core weakness noted with single  limb standing.  Mild increase in right knee pain mild give way with right step ups.  Therapist closely monitoring for safety and pain and modifying as needed.     PT Treatment/Interventions ADLs/Self Care Home Management;Functional mobility training;Stair training;Gait training;Therapeutic activities;Therapeutic exercise;Balance training;Neuromuscular re-education;Patient/family education;Manual techniques   PT Next Visit Plan strength, endurance, balance; standing ex;  continue in preparation for upcoming surgery      Patient will benefit from skilled therapeutic intervention in order to improve the following deficits and impairments:  Postural dysfunction, Decreased strength, Decreased mobility, Decreased balance, Decreased activity tolerance, Decreased endurance, Difficulty walking, Abnormal gait  Visit Diagnosis: Muscle weakness (generalized)  Other abnormalities of gait and mobility  Weakness of left lower extremity     Problem List Patient Active Problem List   Diagnosis Date Noted  . 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 Essential hypertension 02/11/2016  . Lumbar stenosis with neurogenic claudication 02/10/2016  . Hyperlipidemia 02/10/2016  . Hypothyroidism 02/10/2016  . Musculoskeletal neck pain 12/13/2014  . Muscular pain 12/13/2014  . Numbness   . Acute CVA (cerebrovascular accident) ([place]) 09/16/2014  . Right renal mass 09/13/2014  . Cerebral infarction due to unspecified mechanism   . CVA (cerebral infarction) 09/11/2014  . Osteoarthritis 09/11/2014  . Postsurgical hypothyroidism 09/11/2014  . Uncontrolled hypertension 09/11/2014  . History of CVA (cerebral vascular accident) ([place]) 09/11/2014  . Diabetes mellitus without complication ([place]) 81/84/0375  . Dyslipidemia 04/20/2007   Ruben Im, PT 12/10/16 12:28 PM Phone: [phone] Fax: [phone]  Alvera Singh 12/10/2016, 12:27 PM  [facility] [address] [place], [place], [address] Phone: [phone]   Fax:  [phone]  Name: ALEXIYA FRANQUI MRN: [id number] Date of Birth: 1936-05-16

## 2016-12-15 ENCOUNTER — Ambulatory Visit: Payer: Medicare Other | Attending: General Surgery | Admitting: Physical Therapy

## 2016-12-15 DIAGNOSIS — R2689 Other abnormalities of gait and mobility: Secondary | ICD-10-CM | POA: Diagnosis present

## 2016-12-15 DIAGNOSIS — R29898 Other symptoms and signs involving the musculoskeletal system: Secondary | ICD-10-CM | POA: Insufficient documentation

## 2016-12-15 DIAGNOSIS — M6281 Muscle weakness (generalized): Secondary | ICD-10-CM

## 2016-12-15 NOTE — Therapy (Signed)
[facility] [address] [place], [place], [address] Phone: [phone]   Fax:  [phone]  Physical Therapy Treatment  Patient Details  Name: Kaylee Mullins MRN: [id number] Date of Birth: 1935/12/02 Referring Provider: Stark Klein, MD  Encounter Date: 12/15/2016      PT End of Session - 12/15/16 1200    Visit Number 7   Number of Visits 10   Date for PT Re-Evaluation 01/20/17   PT Start Time 6962   PT Stop Time 1230   PT Time Calculation (min) 45 min   Activity Tolerance Patient tolerated treatment well      Past Medical History:  Diagnosis Date  . Arthritis    knees  . Black tarry stools    05-14-16 negative for occult blood with ER visit- noted in [place].  . Cancer [facility])    thyroid cancer- surgery and radiation  . Chronic kidney disease    questionable mass on kidney. Being followed by Dr Diona Fanti  . Complication of anesthesia    heart rate was really low  . Diabetes mellitus    type 2  . Full dentures   . Hypertension   . Hypothyroidism   . Pneumonia   . Spinal stenosis   . Stroke [facility]) 09/2014   left sided weakness    Past Surgical History:  Procedure Laterality Date  . ABDOMINAL HYSTERECTOMY     partial  . cataracts     Removed  11/2015  bilateral  . COLONOSCOPY W/ POLYPECTOMY    . ERCP N/A 05/07/2016   Procedure: ENDOSCOPIC RETROGRADE CHOLANGIOPANCREATOGRAPHY (ERCP);  Surgeon: Clarene Essex, MD;  Location[facility];  Service: Endoscopy;  Laterality: N/A;  . ERCP N/A 10/16/2016   Procedure: ENDOSCOPIC RETROGRADE CHOLANGIOPANCREATOGRAPHY (ERCP);  Surgeon: Clarene Essex, MD;  Location: [facility] ENDOSCOPY;  Service: Endoscopy;  Laterality: N/A;  . EUS N/A 05/27/2016   Procedure: ESOPHAGEAL ENDOSCOPIC ULTRASOUND (EUS) RADIAL;  Surgeon: Arta Silence, MD;  [facility] ENDOSCOPY;  Service: Endoscopy;  Laterality: N/A;  . FLEXIBLE SIGMOIDOSCOPY  03/29/2012   Procedure: FLEXIBLE SIGMOIDOSCOPY;  Surgeon:  Jeryl Columbia, MD;  Location: [facility] ENDOSCOPY;  Service: Endoscopy;  Laterality: N/A;  fleet enema upon arrival  . HOT HEMOSTASIS  03/29/2012   Procedure: HOT HEMOSTASIS (ARGON PLASMA COAGULATION/BICAP);  Surgeon: Jeryl Columbia, MD;  Location: [facility] ENDOSCOPY;  Service: Endoscopy;  Laterality: N/A;  . IR GENERIC HISTORICAL  06/30/2016   IR RADIOLOGIST EVAL & MGMT 06/30/2016 Aletta Edouard, MD [facility] INTERV RAD  . IR GENERIC HISTORICAL  09/09/2016   IR RADIOLOGIST EVAL & MGMT 09/09/2016 Aletta Edouard, MD [facility] INTERV RAD  . LUMBAR LAMINECTOMY/DECOMPRESSION MICRODISCECTOMY N/A 02/10/2016   Procedure: Lumbar three- four Laminectomy;  Surgeon: Kristeen Miss, MD;  Location: [place] NEURO ORS;  Service: Neurosurgery;  Laterality: N/A;  L3-4 Laminectomy  . LUMBAR SPINE SURGERY     1st surgery "ray cage placed"  . THYROIDECTOMY  2005  . TONSILLECTOMY      There were no vitals filed for this visit.      Subjective Assessment - 12/15/16 1150    Subjective Stiff and sore in my back and knees.  I've been doing some walking and I can do a 1/4 mile now.  Surgery scheduled 5/29.   Currently in Pain? Yes   Pain Score 6    Pain Location Back   Aggravating Factors  mornings   Multiple Pain Sites Yes   Pain Score 4   Pain Location Knee  Pain Orientation Right;Left   Aggravating Factors  mornings when I first get up                         [facility] Adult PT Treatment/Exercise - 12/15/16 0001      Therapeutic Activites    Other Therapeutic Activities sit to stand, walking and standing      Neuro Re-ed    Neuro Re-ed Details  core muscle activation,     Knee/Hip Exercises: Stretches   Active Hamstring Stretch Right;Left;30 seconds   Active Hamstring Stretch Limitations seated with stool     Knee/Hip Exercises: Aerobic   Nustep L3 10 min  while discussing progress and treatment plan     Knee/Hip Exercises: Standing   Heel Raises Both;10 reps   Hip ADduction --   Hip Extension  AROM;Right;Left;10 reps   Extension Limitations red band   Forward Step Up Right;Left;5 reps   Rebounder weight shift 3 ways    Other Standing Knee Exercises step taps 10x  UE support   Other Standing Knee Exercises --     Knee/Hip Exercises: Seated   Abd/Adduction Limitations foam roll push downs for core activation 10x   Sit to Sand 10 reps                  PT Short Term Goals - 12/15/16 1314      PT SHORT TERM GOAL #1   Title be independent in initial HEP for strength and endurance   Status Achieved     PT SHORT TERM GOAL #2   Title improve LE strength to perform sit to stand with minimal UE support   Status Achieved     PT SHORT TERM GOAL #3   Title perfrom 5x sit to stand in < or = to 14 seconds   Time 4   Period Weeks   Status On-going           PT Long Term Goals - 12/15/16 1314      PT LONG TERM GOAL #1   Title be independent in advanced HEP   Time 8   Period Weeks   Status On-going     PT LONG TERM GOAL #2   Title Patient will verbalize fall prevention techiques for the home and community   Period Weeks   Status On-going     PT LONG TERM GOAL #3   Title improve LE strength to perform sit to stand without UE support > or = to 50% of the time   Time 8   Period Weeks   Status On-going     PT LONG TERM GOAL #4   Title demonstrate decreased fall risk with TUG in < or 13 seconds   Time 8   Status On-going     PT LONG TERM GOAL #5   Title perform 5x sit to stand in < or = to 12 seconds   Time 8   Period Weeks   Status On-going               Plan - 12/15/16 1200    Clinical Impression Statement The patient is progressing well with core and LE strengthening.  She has progressed to moderate intensity strengthening in standing.  Hip extension and abduction continues to be difficult.  She is highly motivated with exercises and it is evident she is compliant with her current HEP.     PT Treatment/Interventions ADLs/Self Care Home  Management;Functional mobility  training;Stair training;Gait training;Therapeutic activities;Therapeutic exercise;Balance training;Neuromuscular re-education;Patient/family education;Manual techniques   PT Next Visit Plan strength, endurance, balance; standing ex;  continue in preparation for upcoming surgery      Patient will benefit from skilled therapeutic intervention in order to improve the following deficits and impairments:  Postural dysfunction, Decreased strength, Decreased mobility, Decreased balance, Decreased activity tolerance, Decreased endurance, Difficulty walking, Abnormal gait  Visit Diagnosis: Muscle weakness (generalized)  Other abnormalities of gait and mobility  Weakness of left lower extremity     Problem List Patient Active Problem List   Diagnosis Date Noted  . 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 Essential hypertension 02/11/2016  . Lumbar stenosis with neurogenic claudication 02/10/2016  . Hyperlipidemia 02/10/2016  . Hypothyroidism 02/10/2016  . Musculoskeletal neck pain 12/13/2014  . Muscular pain 12/13/2014  . Numbness   . Acute CVA (cerebrovascular accident) ([place]) 09/16/2014  . Right renal mass 09/13/2014  . Cerebral infarction due to unspecified mechanism   . CVA (cerebral infarction) 09/11/2014  . Osteoarthritis 09/11/2014  . Postsurgical hypothyroidism 09/11/2014  . Uncontrolled hypertension 09/11/2014  . History of CVA (cerebral vascular accident) ([place]) 09/11/2014  . Diabetes mellitus without complication ([place]) 87/86/7672  . Dyslipidemia 04/20/2007   Ruben Im, PT 12/15/16 1:18 PM Phone: [phone] Fax: [phone]  Alvera Singh 12/15/2016, 1:17 PM  [facility] [address], [place], [place], [address] Phone: [phone]   Fax:  [phone]  Name: AHANA NAJERA MRN: [id number] Date of Birth: 1936-04-18

## 2016-12-17 ENCOUNTER — Ambulatory Visit: Payer: Medicare Other

## 2016-12-17 DIAGNOSIS — M6281 Muscle weakness (generalized): Secondary | ICD-10-CM

## 2016-12-17 DIAGNOSIS — R29898 Other symptoms and signs involving the musculoskeletal system: Secondary | ICD-10-CM

## 2016-12-17 DIAGNOSIS — R2689 Other abnormalities of gait and mobility: Secondary | ICD-10-CM

## 2016-12-17 NOTE — Therapy (Signed)
[facility] [address] [place], [place], [address] Phone: [phone]   Fax:  [phone]  Physical Therapy Treatment  Patient Details  Name: Kaylee Mullins MRN: [id number] Date of Birth: March 30, 1936 Referring Provider: Stark Klein, MD  Encounter Date: 12/17/2016      PT End of Session - 12/17/16 1228    Visit Number 8   Number of Visits 10   Date for PT Re-Evaluation 01/20/17   PT Start Time 8338   PT Stop Time 1228   PT Time Calculation (min) 43 min   Activity Tolerance Patient tolerated treatment well   Behavior During Therapy [facility] for tasks assessed/performed      Past Medical History:  Diagnosis Date  . Arthritis    knees  . Black tarry stools    05-14-16 negative for occult blood with ER visit- noted in [place].  . Cancer [facility])    thyroid cancer- surgery and radiation  . Chronic kidney disease    questionable mass on kidney. Being followed by Dr Diona Fanti  . Complication of anesthesia    heart rate was really low  . Diabetes mellitus    type 2  . Full dentures   . Hypertension   . Hypothyroidism   . Pneumonia   . Spinal stenosis   . Stroke [facility]) 09/2014   left sided weakness    Past Surgical History:  Procedure Laterality Date  . ABDOMINAL HYSTERECTOMY     partial  . cataracts     Removed  11/2015  bilateral  . COLONOSCOPY W/ POLYPECTOMY    . ERCP N/A 05/07/2016   Procedure: ENDOSCOPIC RETROGRADE CHOLANGIOPANCREATOGRAPHY (ERCP);  Surgeon: Clarene Essex, MD;  Location[facility];  Service: Endoscopy;  Laterality: N/A;  . ERCP N/A 10/16/2016   Procedure: ENDOSCOPIC RETROGRADE CHOLANGIOPANCREATOGRAPHY (ERCP);  Surgeon: Clarene Essex, MD;  Location: [facility] ENDOSCOPY;  Service: Endoscopy;  Laterality: N/A;  . EUS N/A 05/27/2016   Procedure: ESOPHAGEAL ENDOSCOPIC ULTRASOUND (EUS) RADIAL;  Surgeon: Arta Silence, MD;  [facility] ENDOSCOPY;  Service: Endoscopy;  Laterality: N/A;  . FLEXIBLE SIGMOIDOSCOPY   03/29/2012   Procedure: FLEXIBLE SIGMOIDOSCOPY;  Surgeon: Jeryl Columbia, MD;  Location: [facility] ENDOSCOPY;  Service: Endoscopy;  Laterality: N/A;  fleet enema upon arrival  . HOT HEMOSTASIS  03/29/2012   Procedure: HOT HEMOSTASIS (ARGON PLASMA COAGULATION/BICAP);  Surgeon: Jeryl Columbia, MD;  Location: [facility] ENDOSCOPY;  Service: Endoscopy;  Laterality: N/A;  . IR GENERIC HISTORICAL  06/30/2016   IR RADIOLOGIST EVAL & MGMT 06/30/2016 Aletta Edouard, MD [facility] INTERV RAD  . IR GENERIC HISTORICAL  09/09/2016   IR RADIOLOGIST EVAL & MGMT 09/09/2016 Aletta Edouard, MD [facility] INTERV RAD  . LUMBAR LAMINECTOMY/DECOMPRESSION MICRODISCECTOMY N/A 02/10/2016   Procedure: Lumbar three- four Laminectomy;  Surgeon: Kristeen Miss, MD;  Location: [place] NEURO ORS;  Service: Neurosurgery;  Laterality: N/A;  L3-4 Laminectomy  . LUMBAR SPINE SURGERY     1st surgery "ray cage placed"  . THYROIDECTOMY  2005  . TONSILLECTOMY      There were no vitals filed for this visit.      Subjective Assessment - 12/17/16 1153    Subjective I feel like I am getting stronger.   Pertinent History Cancer- bile duct (whipple scheduled 01/12/17) thyroid cancer (remission), CVA 2016   Patient Stated Goals improve strength and endurance prior to surgery   Currently in Pain? Yes   Pain Score 4    Pain Location Back  [place] Adult PT Treatment/Exercise - 12/17/16 0001      Knee/Hip Exercises: Aerobic   Nustep L3 10 min  while discussing progress and treatment plan     Knee/Hip Exercises: Standing   Heel Raises Both;20 reps   Forward Step Up Right;Left;10 reps   Rebounder weight shift 3 ways      Knee/Hip Exercises: Seated   Sit to Sand 20 reps;without UE support  red weighted ball chest press     Shoulder Exercises: Seated   Flexion Strengthening;10 reps;Weights   Flexion Weight (lbs) 1#   Abduction Strengthening;Both;20 reps;Weights   ABduction Weight (lbs) 1#     Shoulder Exercises:  ROM/Strengthening   UBE (Upper Arm Bike) Level 1x 6 minutes                   PT Short Term Goals - 12/15/16 1314      PT SHORT TERM GOAL #1   Title be independent in initial HEP for strength and endurance   Status Achieved     PT SHORT TERM GOAL #2   Title improve LE strength to perform sit to stand with minimal UE support   Status Achieved     PT SHORT TERM GOAL #3   Title perfrom 5x sit to stand in < or = to 14 seconds   Time 4   Period Weeks   Status On-going           PT Long Term Goals - 12/15/16 1314      PT LONG TERM GOAL #1   Title be independent in advanced HEP   Time 8   Period Weeks   Status On-going     PT LONG TERM GOAL #2   Title Patient will verbalize fall prevention techiques for the home and community   Period Weeks   Status On-going     PT LONG TERM GOAL #3   Title improve LE strength to perform sit to stand without UE support > or = to 50% of the time   Time 8   Period Weeks   Status On-going     PT LONG TERM GOAL #4   Title demonstrate decreased fall risk with TUG in < or 13 seconds   Time 8   Status On-going     PT LONG TERM GOAL #5   Title perform 5x sit to stand in < or = to 12 seconds   Time 8   Period Weeks   Status On-going               Plan - 12/17/16 1156    Clinical Impression Statement Pt reports that she feels stronger since beginning PT.  Pt able to tolerate more activity in the clinic.  Close supervision provided by PT for safety and for verbal/tactile cues for technique.  Pt will continue to benefit from skilled PT for endurance and strength progression prior to surgery.     Rehab Potential Good   PT Frequency 2x / week   PT Duration 8 weeks   PT Treatment/Interventions ADLs/Self Care Home Management;Functional mobility training;Stair training;Gait training;Therapeutic activities;Therapeutic exercise;Balance training;Neuromuscular re-education;Patient/family education;Manual techniques   PT Next Visit  Plan strength, endurance, balance; standing ex;  continue in preparation for upcoming surgery   Recommended Other Services Initial Plan of Care is signed   Consulted and Agree with Plan of Care Patient      Patient will benefit from skilled therapeutic intervention in order to improve the following deficits and  impairments:  Postural dysfunction, Decreased strength, Decreased mobility, Decreased balance, Decreased activity tolerance, Decreased endurance, Difficulty walking, Abnormal gait  Visit Diagnosis: Muscle weakness (generalized)  Other abnormalities of gait and mobility  Weakness of left lower extremity     Problem List Patient Active Problem List   Diagnosis Date Noted  . 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 Essential hypertension 02/11/2016  . Lumbar stenosis with neurogenic claudication 02/10/2016  . Hyperlipidemia 02/10/2016  . Hypothyroidism 02/10/2016  . Musculoskeletal neck pain 12/13/2014  . Muscular pain 12/13/2014  . Numbness   . Acute CVA (cerebrovascular accident) ([place]) 09/16/2014  . Right renal mass 09/13/2014  . Cerebral infarction due to unspecified mechanism   . CVA (cerebral infarction) 09/11/2014  . Osteoarthritis 09/11/2014  . Postsurgical hypothyroidism 09/11/2014  . Uncontrolled hypertension 09/11/2014  . History of CVA (cerebral vascular accident) ([place]) 09/11/2014  . Diabetes mellitus without complication ([place]) 47/65/4650  . Dyslipidemia 04/20/2007     Sigurd Sos, PT 12/17/16 12:29 PM  [facility] [address] [place], [place], [address] Phone: [phone]   Fax:  [phone]  Name: Kaylee Mullins MRN: [id number] Date of Birth: November 25, 1935

## 2016-12-21 ENCOUNTER — Ambulatory Visit: Payer: Medicare Other

## 2016-12-21 DIAGNOSIS — R29898 Other symptoms and signs involving the musculoskeletal system: Secondary | ICD-10-CM

## 2016-12-21 DIAGNOSIS — M6281 Muscle weakness (generalized): Secondary | ICD-10-CM

## 2016-12-21 DIAGNOSIS — R2689 Other abnormalities of gait and mobility: Secondary | ICD-10-CM

## 2016-12-21 NOTE — Therapy (Signed)
[facility] [address] [place], [place], [address] Phone: [phone]   Fax:  [phone]  Physical Therapy Treatment  Patient Details  Name: Kaylee Mullins MRN: [id number] Date of Birth: 01/03/1936 Referring Provider: Stark Klein, MD  Encounter Date: 12/21/2016      PT End of Session - 12/21/16 1216    Visit Number 9   Number of Visits 19   Date for PT Re-Evaluation 01/20/17   PT Start Time 1140   PT Stop Time 1221   PT Time Calculation (min) 41 min   Activity Tolerance Patient tolerated treatment well   Behavior During Therapy [facility] for tasks assessed/performed      Past Medical History:  Diagnosis Date  . Arthritis    knees  . Black tarry stools    05-14-16 negative for occult blood with ER visit- noted in [place].  . Cancer [facility])    thyroid cancer- surgery and radiation  . Chronic kidney disease    questionable mass on kidney. Being followed by Dr Diona Fanti  . Complication of anesthesia    heart rate was really low  . Diabetes mellitus    type 2  . Full dentures   . Hypertension   . Hypothyroidism   . Pneumonia   . Spinal stenosis   . Stroke [facility]) 09/2014   left sided weakness    Past Surgical History:  Procedure Laterality Date  . ABDOMINAL HYSTERECTOMY     partial  . cataracts     Removed  11/2015  bilateral  . COLONOSCOPY W/ POLYPECTOMY    . ERCP N/A 05/07/2016   Procedure: ENDOSCOPIC RETROGRADE CHOLANGIOPANCREATOGRAPHY (ERCP);  Surgeon: Clarene Essex, MD;  Location[facility];  Service: Endoscopy;  Laterality: N/A;  . ERCP N/A 10/16/2016   Procedure: ENDOSCOPIC RETROGRADE CHOLANGIOPANCREATOGRAPHY (ERCP);  Surgeon: Clarene Essex, MD;  Location: [facility] ENDOSCOPY;  Service: Endoscopy;  Laterality: N/A;  . EUS N/A 05/27/2016   Procedure: ESOPHAGEAL ENDOSCOPIC ULTRASOUND (EUS) RADIAL;  Surgeon: Arta Silence, MD;  [facility];  Service: Endoscopy;  Laterality: N/A;  . FLEXIBLE SIGMOIDOSCOPY   03/29/2012   Procedure: FLEXIBLE SIGMOIDOSCOPY;  Surgeon: Jeryl Columbia, MD;  Location: [facility] ENDOSCOPY;  Service: Endoscopy;  Laterality: N/A;  fleet enema upon arrival  . HOT HEMOSTASIS  03/29/2012   Procedure: HOT HEMOSTASIS (ARGON PLASMA COAGULATION/BICAP);  Surgeon: Jeryl Columbia, MD;  Location: [facility] ENDOSCOPY;  Service: Endoscopy;  Laterality: N/A;  . IR GENERIC HISTORICAL  06/30/2016   IR RADIOLOGIST EVAL & MGMT 06/30/2016 Aletta Edouard, MD [facility] INTERV RAD  . IR GENERIC HISTORICAL  09/09/2016   IR RADIOLOGIST EVAL & MGMT 09/09/2016 Aletta Edouard, MD [facility] INTERV RAD  . LUMBAR LAMINECTOMY/DECOMPRESSION MICRODISCECTOMY N/A 02/10/2016   Procedure: Lumbar three- four Laminectomy;  Surgeon: Kristeen Miss, MD;  Location: [facility] NEURO ORS;  Service: Neurosurgery;  Laterality: N/A;  L3-4 Laminectomy  . LUMBAR SPINE SURGERY     1st surgery "ray cage placed"  . THYROIDECTOMY  2005  . TONSILLECTOMY      There were no vitals filed for this visit.      Subjective Assessment - 12/21/16 1159    Subjective Getting stronger each day.     Currently in Pain? Yes   Pain Score 6    Pain Location Back   Pain Orientation Medial;Right   Pain Descriptors / Indicators Aching;Tightness   Pain Type Chronic pain   Pain Onset More than a month ago   Pain Frequency Constant  Aggravating Factors  morning hours   Pain Relieving Factors as the day progresses, getting off my feet            [facility] PT Assessment - 2017-01-05 0001      Balance   Balance Assessed --  5x sit to stand: 23 seconds     Timed Up and Go Test   TUG Normal TUG   Normal TUG (seconds) 12                     [facility] Adult PT Treatment/Exercise - 01-05-17 0001      Knee/Hip Exercises: Aerobic   Nustep L3 10 min  while discussing progress and treatment plan     Knee/Hip Exercises: Standing   Heel Raises Both;20 reps   Forward Step Up Right;Left;10 reps;2 sets   Rebounder weight shift 3 ways    Other Standing Knee  Exercises step taps on 6" step 2x10     Knee/Hip Exercises: Seated   Sit to Sand without UE support;10 reps  red weighted ball chest press     Shoulder Exercises: Seated   Flexion Strengthening;10 reps;Weights   Flexion Weight (lbs) 1#   Abduction Strengthening;Both;20 reps;Weights   ABduction Weight (lbs) 1#     Shoulder Exercises: ROM/Strengthening   UBE (Upper Arm Bike) Level 1x 6 minutes                   PT Short Term Goals - 2017-01-05 1217/11/28      PT SHORT TERM GOAL #3   Time 4   Status On-going           PT Long Term Goals - 01/05/2017 28-Nov-1217      PT LONG TERM GOAL #3   Title improve LE strength to perform sit to stand without UE support > or = to 50% of the time   Time 8   Period Weeks   Status On-going     PT LONG TERM GOAL #4   Title demonstrate decreased fall risk with TUG in < or 13 seconds   Baseline 12 seconds   Status Achieved             Patient will benefit from skilled therapeutic intervention in order to improve the following deficits and impairments:     Visit Diagnosis: Muscle weakness (generalized)  Other abnormalities of gait and mobility  Weakness of left lower extremity       G-Codes - 01-05-2017 11/28/1200    Functional Assessment Tool Used (Outpatient Only) TUG: 12 seconds, 5x sit to stand 23   Functional Limitation Mobility: Walking and moving around   Mobility: Walking and Moving Around Current Status [phone]) At least 40 percent but less than 60 percent impaired, limited or restricted   Mobility: Walking and Moving Around Goal Status [phone]) At least 40 percent but less than 60 percent impaired, limited or restricted      Problem List Patient Active Problem List   Diagnosis Date Noted  . 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 Essential hypertension 02/11/2016  . Lumbar stenosis with neurogenic claudication 02/10/2016  . Hyperlipidemia 02/10/2016  . Hypothyroidism 02/10/2016  . Musculoskeletal neck pain 12/13/2014   . Muscular pain 12/13/2014  . Numbness   . Acute CVA (cerebrovascular accident) ([place]) 09/16/2014  . Right renal mass 09/13/2014  . Cerebral infarction due to unspecified mechanism   . CVA (cerebral infarction) 09/11/2014  . Osteoarthritis 09/11/2014  . Postsurgical hypothyroidism 09/11/2014  . Uncontrolled hypertension  09/11/2014  . History of CVA (cerebral vascular accident) ([place]) 09/11/2014  . Diabetes mellitus without complication ([place]) 65/99/3570  . Dyslipidemia 04/20/2007     Sigurd Sos, PT 12/21/16 12:20 PM  [facility] [address] [place], [place], [address] Phone: [phone]   Fax:  [phone]  Name: DALYCE RENNE MRN: [id number] Date of Birth: 1935/08/28

## 2016-12-23 ENCOUNTER — Ambulatory Visit: Payer: Medicare Other | Admitting: Physical Therapy

## 2016-12-23 ENCOUNTER — Encounter: Payer: Self-pay | Admitting: Physical Therapy

## 2016-12-23 DIAGNOSIS — R29898 Other symptoms and signs involving the musculoskeletal system: Secondary | ICD-10-CM

## 2016-12-23 DIAGNOSIS — M6281 Muscle weakness (generalized): Secondary | ICD-10-CM | POA: Diagnosis not present

## 2016-12-23 DIAGNOSIS — R2689 Other abnormalities of gait and mobility: Secondary | ICD-10-CM

## 2016-12-23 NOTE — Therapy (Signed)
[facility] [address], [place], [place], [address] Phone: [phone]   Fax:  [phone]  Physical Therapy Treatment  Patient Details  Name: Kaylee Mullins MRN: [id number] Date of Birth: 13-Oct-1935 Referring Provider: Stark Klein, MD  Encounter Date: 12/23/2016      PT End of Session - 12/23/16 1147    Visit Number 10   Number of Visits 19   Date for PT Re-Evaluation 01/20/17   PT Start Time 1324   PT Stop Time 1226   PT Time Calculation (min) 43 min   Activity Tolerance Patient tolerated treatment well   Behavior During Therapy [facility] for tasks assessed/performed      Past Medical History:  Diagnosis Date  . Arthritis    knees  . Black tarry stools    05-14-16 negative for occult blood with ER visit- noted in [place].  . Cancer [facility])    thyroid cancer- surgery and radiation  . Chronic kidney disease    questionable mass on kidney. Being followed by Dr Diona Fanti  . Complication of anesthesia    heart rate was really low  . Diabetes mellitus    type 2  . Full dentures   . Hypertension   . Hypothyroidism   . Pneumonia   . Spinal stenosis   . Stroke [facility]) 09/2014   left sided weakness    Past Surgical History:  Procedure Laterality Date  . ABDOMINAL HYSTERECTOMY     partial  . cataracts     Removed  11/2015  bilateral  . COLONOSCOPY W/ POLYPECTOMY    . ERCP N/A 05/07/2016   Procedure: ENDOSCOPIC RETROGRADE CHOLANGIOPANCREATOGRAPHY (ERCP);  Surgeon: Clarene Essex, MD;  [facility];  Service: Endoscopy;  Laterality: N/A;  . ERCP N/A 10/16/2016   Procedure: ENDOSCOPIC RETROGRADE CHOLANGIOPANCREATOGRAPHY (ERCP);  Surgeon: Clarene Essex, MD;  Location: [facility] ENDOSCOPY;  Service: Endoscopy;  Laterality: N/A;  . EUS N/A 05/27/2016   Procedure: ESOPHAGEAL ENDOSCOPIC ULTRASOUND (EUS) RADIAL;  Surgeon: Arta Silence, MD;  [facility] ENDOSCOPY;  Service: Endoscopy;  Laterality: N/A;  . FLEXIBLE  SIGMOIDOSCOPY  03/29/2012   Procedure: FLEXIBLE SIGMOIDOSCOPY;  Surgeon: Jeryl Columbia, MD;  Location: [facility] ENDOSCOPY;  Service: Endoscopy;  Laterality: N/A;  fleet enema upon arrival  . HOT HEMOSTASIS  03/29/2012   Procedure: HOT HEMOSTASIS (ARGON PLASMA COAGULATION/BICAP);  Surgeon: Jeryl Columbia, MD;  Location: [facility] ENDOSCOPY;  Service: Endoscopy;  Laterality: N/A;  . IR GENERIC HISTORICAL  06/30/2016   IR RADIOLOGIST EVAL & MGMT 06/30/2016 Aletta Edouard, MD [facility] INTERV RAD  . IR GENERIC HISTORICAL  09/09/2016   IR RADIOLOGIST EVAL & MGMT 09/09/2016 Aletta Edouard, MD [facility] INTERV RAD  . LUMBAR LAMINECTOMY/DECOMPRESSION MICRODISCECTOMY N/A 02/10/2016   Procedure: Lumbar three- four Laminectomy;  Surgeon: Kristeen Miss, MD;  Location: [place] [facility] [facility]S;  Service: Neurosurgery;  Laterality: N/A;  L3-4 Laminectomy  . LUMBAR SPINE SURGERY     1st surgery "ray cage placed"  . THYROIDECTOMY  2005  . TONSILLECTOMY      There were no vitals filed for this visit.      Subjective Assessment - 12/23/16 1148    Subjective I am stiff this AM.   Pertinent History Cancer- bile duct (whipple scheduled 01/12/17) thyroid cancer (remission), CVA 2016   Limitations Standing;Walking   How long can you stand comfortably? 10 minutes   How long can you walk comfortably? "I don't do a lot of walking"- 5 minutes at a time, uses motorized  scooter in grocery store   Patient Stated Goals improve strength and endurance prior to surgery   Currently in Pain? Yes  Knees, hips, my sit bones   Pain Score 6    Pain Descriptors / Indicators Aching   Aggravating Factors  Morning   Pain Relieving Factors rest   Multiple Pain Sites No                         [facility] Adult PT Treatment/Exercise - 12/23/16 0001      Knee/Hip Exercises: Aerobic   Nustep L3 10 min  while discussing progress and treatment plan     Knee/Hip Exercises: Standing   Heel Raises Both;20 reps   Forward Step Up Both;2 sets;15 reps;Step  Height: 6"   Rebounder weight shift 3 ways    Other Standing Knee Exercises step taps on 6" step 2x10  No UE     Knee/Hip Exercises: Seated   Sit to Sand without UE support;10 reps  red weighted ball chest press     Shoulder Exercises: Seated   Horizontal ABduction Strengthening;Both;20 reps;Theraband   Theraband Level (Shoulder Horizontal ABduction) Level 2 (Red)   Flexion Strengthening;Both;20 reps;Weights  VC for posture   Flexion Weight (lbs) 2   Abduction Strengthening;Both;20 reps;Weights  VC for posture   ABduction Weight (lbs) 2     Shoulder Exercises: ROM/Strengthening   UBE (Upper Arm Bike) Level 1x 6 minutes   Lumbar support                  PT Short Term Goals - 12/21/16 1219      PT SHORT TERM GOAL #3   Time 4   Status On-going           PT Long Term Goals - 12/21/16 1219      PT LONG TERM GOAL #3   Title improve LE strength to perform sit to stand without UE support > or = to 50% of the time   Time 8   Period Weeks   Status On-going     PT LONG TERM GOAL #4   Title demonstrate decreased fall risk with TUG in < or 13 seconds   Baseline 12 seconds   Status Achieved               Plan - 12/23/16 1147    Clinical Impression Statement Pt demonstrated good endurance throughout session today as she required little no rest breaks. Mainly verbal cuing on posture thoughoutout the treatment was needed. No pain limiting any  part of treatment.  Surgery on the 29th.     PT Frequency 2x / week   PT Duration 8 weeks   PT Treatment/Interventions ADLs/Self Care Home Management;Functional mobility training;Stair training;Gait training;Therapeutic activities;Therapeutic exercise;Balance training;Neuromuscular re-education;Patient/family education;Manual techniques   PT Next Visit Plan strength, endurance, balance; standing ex;  continue in preparation for upcoming surgery   Consulted and Agree with Plan of Care --      Patient will benefit from  skilled therapeutic intervention in order to improve the following deficits and impairments:  Postural dysfunction, Decreased strength, Decreased mobility, Decreased balance, Decreased activity tolerance, Decreased endurance, Difficulty walking, Abnormal gait  Visit Diagnosis: Muscle weakness (generalized)  Other abnormalities of gait and mobility  Weakness of left lower extremity     Problem List Patient Active Problem List   Diagnosis Date Noted  . 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 Essential hypertension 02/11/2016  . Lumbar stenosis  with neurogenic claudication 02/10/2016  . Hyperlipidemia 02/10/2016  . Hypothyroidism 02/10/2016  . Musculoskeletal neck pain 12/13/2014  . Muscular pain 12/13/2014  . Numbness   . Acute CVA (cerebrovascular accident) ([place]) 09/16/2014  . Right renal mass 09/13/2014  . Cerebral infarction due to unspecified mechanism   . CVA (cerebral infarction) 09/11/2014  . Osteoarthritis 09/11/2014  . Postsurgical hypothyroidism 09/11/2014  . Uncontrolled hypertension 09/11/2014  . History of CVA (cerebral vascular accident) ([place]) 09/11/2014  . Diabetes mellitus without complication ([place]) 19/59/7471  . Dyslipidemia 04/20/2007    Rodney Wigger, PTA 12/23/2016, 12:24 PM  [facility] [address] [place], [place], [address] Phone: [phone]   Fax:  [phone]  Name: QUANASIA DEFINO MRN: [id number] Date of Birth: 08-Aug-1936

## 2016-12-28 ENCOUNTER — Encounter: Payer: Self-pay | Admitting: Physical Therapy

## 2016-12-28 ENCOUNTER — Ambulatory Visit: Payer: Medicare Other | Admitting: Physical Therapy

## 2016-12-28 DIAGNOSIS — M6281 Muscle weakness (generalized): Secondary | ICD-10-CM | POA: Diagnosis not present

## 2016-12-28 DIAGNOSIS — R29898 Other symptoms and signs involving the musculoskeletal system: Secondary | ICD-10-CM

## 2016-12-28 DIAGNOSIS — R2689 Other abnormalities of gait and mobility: Secondary | ICD-10-CM

## 2016-12-28 NOTE — Therapy (Signed)
[facility] [address] [place], [place], [address] Phone: [phone]   Fax:  [phone]  Physical Therapy Treatment  Patient Details  Name: Kaylee Mullins MRN: [id number] Date of Birth: 1935/11/23 Referring Provider: Stark Klein, MD  Encounter Date: 12/28/2016      PT End of Session - 12/28/16 1146    Visit Number 11   Number of Visits 19   Date for PT Re-Evaluation 01/20/17   PT Start Time 0867   PT Stop Time 1225   PT Time Calculation (min) 40 min   Activity Tolerance Patient tolerated treatment well   Behavior During Therapy [facility] for tasks assessed/performed      Past Medical History:  Diagnosis Date  . Arthritis    knees  . Black tarry stools    05-14-16 negative for occult blood with ER visit- noted in [place].  . Cancer [facility])    thyroid cancer- surgery and radiation  . Chronic kidney disease    questionable mass on kidney. Being followed by Dr Diona Fanti  . Complication of anesthesia    heart rate was really low  . Diabetes mellitus    type 2  . Full dentures   . Hypertension   . Hypothyroidism   . Pneumonia   . Spinal stenosis   . Stroke [facility]) 09/2014   left sided weakness    Past Surgical History:  Procedure Laterality Date  . ABDOMINAL HYSTERECTOMY     partial  . cataracts     Removed  11/2015  bilateral  . COLONOSCOPY W/ POLYPECTOMY    . ERCP N/A 05/07/2016   Procedure: ENDOSCOPIC RETROGRADE CHOLANGIOPANCREATOGRAPHY (ERCP);  Surgeon: Clarene Essex, MD;  Location: [facility];  Service: Endoscopy;  Laterality: N/A;  . ERCP N/A 10/16/2016   Procedure: ENDOSCOPIC RETROGRADE CHOLANGIOPANCREATOGRAPHY (ERCP);  Surgeon: Clarene Essex, MD;  Location: [facility] ENDOSCOPY;  Service: Endoscopy;  Laterality: N/A;  . EUS N/A 05/27/2016   Procedure: ESOPHAGEAL ENDOSCOPIC ULTRASOUND (EUS) RADIAL;  Surgeon: Arta Silence, MD;  [facility];  Service: Endoscopy;  Laterality: N/A;  . FLEXIBLE  SIGMOIDOSCOPY  03/29/2012   Procedure: FLEXIBLE SIGMOIDOSCOPY;  Surgeon: Jeryl Columbia, MD;  Location: [facility] ENDOSCOPY;  Service: Endoscopy;  Laterality: N/A;  fleet enema upon arrival  . HOT HEMOSTASIS  03/29/2012   Procedure: HOT HEMOSTASIS (ARGON PLASMA COAGULATION/BICAP);  Surgeon: Jeryl Columbia, MD;  Location: [facility] ENDOSCOPY;  Service: Endoscopy;  Laterality: N/A;  . IR GENERIC HISTORICAL  06/30/2016   IR RADIOLOGIST EVAL & MGMT 06/30/2016 Aletta Edouard, MD [facility] INTERV RAD  . IR GENERIC HISTORICAL  09/09/2016   IR RADIOLOGIST EVAL & MGMT 09/09/2016 Aletta Edouard, MD [facility] INTERV RAD  . LUMBAR LAMINECTOMY/DECOMPRESSION MICRODISCECTOMY N/A 02/10/2016   Procedure: Lumbar three- four Laminectomy;  Surgeon: Kristeen Miss, MD;  Location: [place] [facility];  Service: Neurosurgery;  Laterality: N/A;  L3-4 Laminectomy  . LUMBAR SPINE SURGERY     1st surgery "ray cage placed"  . THYROIDECTOMY  2005  . TONSILLECTOMY      There were no vitals filed for this visit.      Subjective Assessment - 12/28/16 1147    Subjective No complaints this AM. Had a good weekend.    Pertinent History Cancer- bile duct (whipple scheduled 01/12/17) thyroid cancer (remission), CVA 2016   Limitations Standing;Walking   How long can you stand comfortably? 10 minutes   How long can you walk comfortably? "I don't do a lot of walking"- 5 minutes at  a time, uses motorized scooter in grocery store   Patient Stated Goals improve strength and endurance prior to surgery   Currently in Pain? Yes  Knees are aching today   Pain Score 3    Aggravating Factors  Intermittent factors   Pain Relieving Factors rest   Multiple Pain Sites No            [facility] PT Assessment - 12/28/16 0001      Balance   Balance Assessed --  5x sit to stand test 13 seconds                     [facility] Adult PT Treatment/Exercise - 12/28/16 0001      Lumbar Exercises: Seated   Other Seated Lumbar Exercises Bicep curls 15x bil 3#       Knee/Hip Exercises: Aerobic   Nustep L3 12 min  while discussing progress and treatment plan     Knee/Hip Exercises: Standing   Heel Raises Both;20 reps   Forward Step Up Both;2 sets;15 reps;Step Height: 6"   Rebounder weight shift 3 ways    Other Standing Knee Exercises step taps on 6" step 2x10  No UE     Shoulder Exercises: Seated   Horizontal ABduction Strengthening;Both;20 reps;Theraband   Theraband Level (Shoulder Horizontal ABduction) Level 2 (Red)   Horizontal ABduction Limitations Diagonals bil 10x   Flexion --  Overhead single arm punches: 2# 10x   Flexion Weight (lbs) 2     Shoulder Exercises: ROM/Strengthening   UBE (Upper Arm Bike) Level 1x 6 minutes   Lumbar support                  PT Short Term Goals - 12/28/16 1155      PT SHORT TERM GOAL #3   Title perfrom 5x sit to stand in < or = to 14 seconds   Time 4   Period Weeks   Status Achieved  13 sec           PT Long Term Goals - 12/21/16 1219      PT LONG TERM GOAL #3   Title improve LE strength to perform sit to stand without UE support > or = to 50% of the time   Time 8   Period Weeks   Status On-going     PT LONG TERM GOAL #4   Title demonstrate decreased fall risk with TUG in < or 13 seconds   Baseline 12 seconds   Status Achieved               Plan - 12/28/16 1146    Clinical Impression Statement Pt continually needs postural cuing throughout the session.  She tolerates all exercises well, no need for rest breaks except for sitting after standing  exercises but was still able to keep exercising. She did meet her sit to stand STG today.    Rehab Potential Good   PT Frequency 2x / week   PT Duration 8 weeks   PT Treatment/Interventions ADLs/Self Care Home Management;Functional mobility training;Stair training;Gait training;Therapeutic activities;Therapeutic exercise;Balance training;Neuromuscular re-education;Patient/family education;Manual techniques   PT Next Visit Plan  strength, endurance, balance; standing ex;  continue in preparation for upcoming surgery   Consulted and Agree with Plan of Care Patient      Patient will benefit from skilled therapeutic intervention in order to improve the following deficits and impairments:  Postural dysfunction, Decreased strength, Decreased mobility, Decreased balance, Decreased activity tolerance, Decreased endurance, Difficulty  walking, Abnormal gait  Visit Diagnosis: Muscle weakness (generalized)  Other abnormalities of gait and mobility  Weakness of left lower extremity     Problem List Patient Active Problem List   Diagnosis Date Noted  . 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 Essential hypertension 02/11/2016  . Lumbar stenosis with neurogenic claudication 02/10/2016  . Hyperlipidemia 02/10/2016  . Hypothyroidism 02/10/2016  . Musculoskeletal neck pain 12/13/2014  . Muscular pain 12/13/2014  . Numbness   . Acute CVA (cerebrovascular accident) ([place]) 09/16/2014  . Right renal mass 09/13/2014  . Cerebral infarction due to unspecified mechanism   . CVA (cerebral infarction) 09/11/2014  . Osteoarthritis 09/11/2014  . Postsurgical hypothyroidism 09/11/2014  . Uncontrolled hypertension 09/11/2014  . History of CVA (cerebral vascular accident) ([place]) 09/11/2014  . Diabetes mellitus without complication ([place]) 93/57/0177  . Dyslipidemia 04/20/2007    Leah Thornberry, PTA 12/28/2016, 12:23 PM  [facility] [address] [place], [place], [address] Phone: [phone]   Fax:  [phone]  Name: Kaylee Mullins MRN: [id number] Date of Birth: 1936-03-15

## 2016-12-30 ENCOUNTER — Ambulatory Visit: Payer: Medicare Other | Admitting: Physical Therapy

## 2016-12-30 ENCOUNTER — Encounter: Payer: Self-pay | Admitting: Physical Therapy

## 2016-12-30 DIAGNOSIS — R2689 Other abnormalities of gait and mobility: Secondary | ICD-10-CM

## 2016-12-30 DIAGNOSIS — M6281 Muscle weakness (generalized): Secondary | ICD-10-CM | POA: Diagnosis not present

## 2016-12-30 DIAGNOSIS — R29898 Other symptoms and signs involving the musculoskeletal system: Secondary | ICD-10-CM

## 2016-12-30 NOTE — Therapy (Addendum)
[facility] [address], [place], [place], [address] Phone: [phone]   Fax:  [phone]  Physical Therapy Treatment  Patient Details  Name: Kaylee Mullins MRN: [id number] Date of Birth: 1936-05-14 Referring Provider: Stark Klein, MD  Encounter Date: 12/30/2016      PT End of Session - 12/30/16 1147    Visit Number 12   Number of Visits 19   Date for PT Re-Evaluation 01/20/17   PT Start Time 9476   PT Stop Time 1231   PT Time Calculation (min) 44 min   Activity Tolerance Patient tolerated treatment well   Behavior During Therapy [facility] for tasks assessed/performed      Past Medical History:  Diagnosis Date  . Arthritis    knees  . Black tarry stools    05-14-16 negative for occult blood with ER visit- noted in [place].  . Cancer [facility])    thyroid cancer- surgery and radiation  . Chronic kidney disease    questionable mass on kidney. Being followed by Dr Diona Fanti  . Complication of anesthesia    heart rate was really low  . Diabetes mellitus    type 2  . Full dentures   . Hypertension   . Hypothyroidism   . Pneumonia   . Spinal stenosis   . Stroke [facility]) 09/2014   left sided weakness    Past Surgical History:  Procedure Laterality Date  . ABDOMINAL HYSTERECTOMY     partial  . cataracts     Removed  11/2015  bilateral  . COLONOSCOPY W/ POLYPECTOMY    . ERCP N/A 05/07/2016   Procedure: ENDOSCOPIC RETROGRADE CHOLANGIOPANCREATOGRAPHY (ERCP);  Surgeon: Clarene Essex, MD;  Location[facility];  Service: Endoscopy;  Laterality: N/A;  . ERCP N/A 10/16/2016   Procedure: ENDOSCOPIC RETROGRADE CHOLANGIOPANCREATOGRAPHY (ERCP);  Surgeon: Clarene Essex, MD;  Location: [facility] ENDOSCOPY;  Service: Endoscopy;  Laterality: N/A;  . EUS N/A 05/27/2016   Procedure: ESOPHAGEAL ENDOSCOPIC ULTRASOUND (EUS) RADIAL;  Surgeon: Arta Silence, MD;  [facility];  Service: Endoscopy;  Laterality: N/A;  . FLEXIBLE  SIGMOIDOSCOPY  03/29/2012   Procedure: FLEXIBLE SIGMOIDOSCOPY;  Surgeon: Jeryl Columbia, MD;  Location[facility]COPY;  Service: Endoscopy;  Laterality: N/A;  fleet enema upon arrival  . HOT HEMOSTASIS  03/29/2012   Procedure: HOT HEMOSTASIS (ARGON PLASMA COAGULATION/BICAP);  Surgeon: Jeryl Columbia, MD;  Location: [name] ENDOSCOPY;  Service: Endoscopy;  Laterality: N/A;  . IR GENERIC HISTORICAL  06/30/2016   IR RADIOLOGIST EVAL & MGMT 06/30/2016 Aletta Edouard, MD [facility] INTERV RAD  . IR GENERIC HISTORICAL  09/09/2016   IR RADIOLOGIST EVAL & MGMT 09/09/2016 Aletta Edouard, MD [facility] INTERV RAD  . LUMBAR LAMINECTOMY/DECOMPRESSION MICRODISCECTOMY N/A 02/10/2016   Procedure: Lumbar three- four Laminectomy;  Surgeon: Kristeen Miss, MD;  Location: [place] [facility]UR[facility] ORS;  Service: Neurosurgery;  Laterality: N/A;  L3-4 Laminectomy  . LUMBAR SPINE SURGERY     1st surgery "ray cage placed"  . THYROIDECTOMY  2005  . TONSILLECTOMY      There were no vitals filed for this visit.      Subjective Assessment - 12/30/16 1149    Subjective A little stiff, knees a little achy, but overall "I'm not complaining."   Pertinent History Cancer- bile duct (whipple scheduled 01/12/17) thyroid cancer (remission), CVA 2016   Limitations Standing;Walking   How long can you stand comfortably? 10 minutes   How long can you walk comfortably? "I don't do a lot of walking"-  5 minutes at a time, uses motorized scooter in grocery store   Patient Stated Goals improve strength and endurance prior to surgery   Currently in Pain? Yes   Pain Score 3    Pain Location Knee   Pain Orientation Right;Left   Pain Descriptors / Indicators Aching   Aggravating Factors  Weather today   Pain Relieving Factors exercise   Multiple Pain Sites No                         [facility] Adult PT Treatment/Exercise - 12/30/16 0001      Knee/Hip Exercises: Aerobic   Nustep L3 12 min  while discussing progress and treatment plan     Knee/Hip  Exercises: Standing   Heel Raises Both;20 reps   Forward Step Up Both;2 sets;15 reps;Step Height: 6"   Rebounder weight shift 3 ways    Other Standing Knee Exercises step taps on 6" step 2x10  No UE     Knee/Hip Exercises: Seated   Ball Squeeze 20x   Sit to Sand 20 reps;without UE support  red weighted ball chest press     Shoulder Exercises: Seated   Horizontal ABduction Strengthening;Both;20 reps;Theraband   Theraband Level (Shoulder Horizontal ABduction) Level 2 (Red)   Horizontal ABduction Limitations Tricep extension bil with red band 2x 10  Bicep curls bil 3# 20x VC for posture   Flexion --  Overhead single arm punches: 2# 10x2 Bil   Flexion Weight (lbs) 2     Shoulder Exercises: ROM/Strengthening   UBE (Upper Arm Bike) L2 4x4   PTA present for status update                  PT Short Term Goals - 12/28/16 1155      PT SHORT TERM GOAL #3   Title perfrom 5x sit to stand in < or = to 14 seconds   Time 4   Period Weeks   Status Achieved  13 sec           PT Long Term Goals - 12/21/16 1219      PT LONG TERM GOAL #3   Title improve LE strength to perform sit to stand without UE support > or = to 50% of the time   Time 8   Period Weeks   Status On-going     PT LONG TERM GOAL #4   Title demonstrate decreased fall risk with TUG in < or 13 seconds   Baseline 12 seconds   Status Achieved               Plan - 12/30/16 1148    Clinical Impression Statement Postural cuing continues with most exercises. Today pt was asked to do more reps or sets to increase her overall work load, working towards improving her functional activity tolerance.    Rehab Potential Good   PT Frequency 2x / week   PT Duration 8 weeks   PT Treatment/Interventions ADLs/Self Care Home Management;Functional mobility training;Stair training;Gait training;Therapeutic activities;Therapeutic exercise;Balance training;Neuromuscular re-education;Patient/family education;Manual  techniques   PT Next Visit Plan strength, endurance, balance; standing ex;  continue in preparation for upcoming surgery   Consulted and Agree with Plan of Care Patient      Patient will benefit from skilled therapeutic intervention in order to improve the following deficits and impairments:  Postural dysfunction, Decreased strength, Decreased mobility, Decreased balance, Decreased activity tolerance, Decreased endurance, Difficulty walking, Abnormal gait  Visit Diagnosis:   Muscle weakness (generalized)  Other abnormalities of gait and mobility  Weakness of left lower extremity     Problem List Patient Active Problem List   Diagnosis Date Noted  . 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 Essential hypertension 02/11/2016  . Lumbar stenosis with neurogenic claudication 02/10/2016  . Hyperlipidemia 02/10/2016  . Hypothyroidism 02/10/2016  . Musculoskeletal neck pain 12/13/2014  . Muscular pain 12/13/2014  . Numbness   . Acute CVA (cerebrovascular accident) ([place]) 09/16/2014  . Right renal mass 09/13/2014  . Cerebral infarction due to unspecified mechanism   . CVA (cerebral infarction) 09/11/2014  . Osteoarthritis 09/11/2014  . Postsurgical hypothyroidism 09/11/2014  . Uncontrolled hypertension 09/11/2014  . History of CVA (cerebral vascular accident) ([place]) 09/11/2014  . Diabetes mellitus without complication ([place]) 58/83/2549  . Dyslipidemia 04/20/2007    COCHRAN,JENNIFER, PTA 12/30/2016, 12:22 PM PHYSICAL THERAPY DISCHARGE SUMMARY G-codes: Mobility Category Goal Status: CK D/C status: CK   Visits from Start of Care: 12  Current functional level related to goals / functional outcomes: Pt will have surgery 01/12/17.  She canceled her final PT session due to conflict.     Remaining deficits: See above for current status.     Education / Equipment: HEP Plan: Patient agrees to discharge.  Patient goals were partially met. Patient is being discharged due to the patient's  request.  ?????       Sigurd Sos, PT 01/07/17 9:05 AM   [facility] [address] [place], [place], [address] Phone: [phone]   Fax:  [phone]  Name: ARINA TORRY MRN: [id number] Date of Birth: 1936-08-08

## 2017-01-04 ENCOUNTER — Encounter (HOSPITAL_COMMUNITY)
Admission: RE | Admit: 2017-01-04 | Discharge: 2017-01-04 | Disposition: A | Discharge status: Home / Self Care | Payer: Medicare Other | Source: Ambulatory Visit | Attending: General Surgery | Admitting: General Surgery

## 2017-01-04 ENCOUNTER — Encounter (HOSPITAL_COMMUNITY): Payer: Self-pay

## 2017-01-04 DIAGNOSIS — E119 Type 2 diabetes mellitus without complications: Secondary | ICD-10-CM | POA: Diagnosis not present

## 2017-01-04 DIAGNOSIS — Z8673 Personal history of transient ischemic attack (TIA), and cerebral infarction without residual deficits: Secondary | ICD-10-CM | POA: Diagnosis not present

## 2017-01-04 DIAGNOSIS — N2889 Other specified disorders of kidney and ureter: Secondary | ICD-10-CM | POA: Insufficient documentation

## 2017-01-04 DIAGNOSIS — M542 Cervicalgia: Secondary | ICD-10-CM | POA: Insufficient documentation

## 2017-01-04 DIAGNOSIS — D649 Anemia, unspecified: Secondary | ICD-10-CM | POA: Diagnosis not present

## 2017-01-04 DIAGNOSIS — E890 Postprocedural hypothyroidism: Secondary | ICD-10-CM | POA: Diagnosis not present

## 2017-01-04 DIAGNOSIS — C241 Malignant neoplasm of ampulla of Vater: Secondary | ICD-10-CM | POA: Insufficient documentation

## 2017-01-04 DIAGNOSIS — E785 Hyperlipidemia, unspecified: Secondary | ICD-10-CM | POA: Insufficient documentation

## 2017-01-04 DIAGNOSIS — I10 Essential (primary) hypertension: Secondary | ICD-10-CM | POA: Diagnosis not present

## 2017-01-04 DIAGNOSIS — M48062 Spinal stenosis, lumbar region with neurogenic claudication: Secondary | ICD-10-CM | POA: Diagnosis not present

## 2017-01-04 DIAGNOSIS — Z01812 Encounter for preprocedural laboratory examination: Secondary | ICD-10-CM | POA: Insufficient documentation

## 2017-01-04 HISTORY — DX: Depression, unspecified: F32.A

## 2017-01-04 HISTORY — DX: Major depressive disorder, single episode, unspecified: F32.9

## 2017-01-04 HISTORY — DX: Anxiety disorder, unspecified: F41.9

## 2017-01-04 LAB — URINALYSIS, ROUTINE W REFLEX MICROSCOPIC
Bilirubin Urine: NEGATIVE
Glucose, UA: 50 mg/dL — AB
Hgb urine dipstick: NEGATIVE
Ketones, ur: NEGATIVE mg/dL
Nitrite: NEGATIVE
Protein, ur: NEGATIVE mg/dL
Specific Gravity, Urine: 1.018 (ref 1.005–1.030)
pH: 5 (ref 5.0–8.0)

## 2017-01-04 LAB — COMPREHENSIVE METABOLIC PANEL
ALT: 23 U/L (ref 14–54)
AST: 27 U/L (ref 15–41)
Albumin: 3.7 g/dL (ref 3.5–5.0)
Alkaline Phosphatase: 81 U/L (ref 38–126)
Anion gap: 9 (ref 5–15)
BUN: 16 mg/dL (ref 6–20)
CO2: 23 mmol/L (ref 22–32)
Calcium: 9 mg/dL (ref 8.9–10.3)
Chloride: 106 mmol/L (ref 101–111)
Creatinine, Ser: 0.85 mg/dL (ref 0.44–1.00)
GFR calc Af Amer: >60 mL/min (ref >60)
GFR calc non Af Amer: >60 mL/min (ref >60)
Glucose, Bld: 290 mg/dL — ABNORMAL HIGH (ref 65–99)
Potassium: 3.8 mmol/L (ref 3.5–5.1)
Sodium: 138 mmol/L (ref 135–145)
Total Bilirubin: 1 mg/dL (ref 0.3–1.2)
Total Protein: 7.5 g/dL (ref 6.5–8.1)

## 2017-01-04 LAB — CBC WITH DIFFERENTIAL/PLATELET
Basophils Absolute: 0 10*3/uL (ref 0.0–0.1)
Basophils Relative: 0 %
Eosinophils Absolute: 0.1 10*3/uL (ref 0.0–0.7)
Eosinophils Relative: 1 %
HCT: 38.2 % (ref 36.0–46.0)
Hemoglobin: 12.7 g/dL (ref 12.0–15.0)
Lymphocytes Relative: 35 %
Lymphs Abs: 2.7 10*3/uL (ref 0.7–4.0)
MCH: 31.9 pg (ref 26.0–34.0)
MCHC: 33.2 g/dL (ref 30.0–36.0)
MCV: 96 fL (ref 78.0–100.0)
Monocytes Absolute: 0.6 10*3/uL (ref 0.1–1.0)
Monocytes Relative: 8 %
Neutro Abs: 4.2 10*3/uL (ref 1.7–7.7)
Neutrophils Relative %: 56 %
Platelets: 239 10*3/uL (ref 150–400)
RBC: 3.98 MIL/uL (ref 3.87–5.11)
RDW: 12.9 % (ref 11.5–15.5)
WBC: 7.6 10*3/uL (ref 4.0–10.5)

## 2017-01-04 LAB — GLUCOSE, CAPILLARY: Glucose-Capillary: 283 mg/dL — ABNORMAL HIGH (ref 65–99)

## 2017-01-04 LAB — PROTIME-INR
INR: 1.2
Prothrombin Time: 15.3 seconds — ABNORMAL HIGH (ref 11.4–15.2)

## 2017-01-04 LAB — PREPARE RBC (CROSSMATCH)

## 2017-01-04 LAB — ABO/RH: ABO/RH(D): B POS

## 2017-01-04 NOTE — Progress Notes (Addendum)
PCP  Dr. Nolene Ebbs   ECHO 01-29-2017  Stress Test  01-30-2017  Pt. Has had no  Instructions on Plavix,Dr. Byerly's office notified .They will call patient and give her instructions on Plavix.   Per Dr. [name] Hold Plavix for 1 week

## 2017-01-04 NOTE — Pre-Procedure Instructions (Signed)
Kaylee Mullins  01/04/2017      [id number] [place], [place] [address] [place] [address] Phone: [phone] Fax: [phone]    Your procedure is scheduled on 01-12-2017 Tuesday .  Report to [facility] Admitting at 5:30 A.M.   Call this number if you have problems the morning of surgery:  [phone]   Remember:  Do not eat food or drink liquids after midnight.   Take these medicines the morning of surgery with A SIP OF WATER Tylenol if needed,levothyroxine/Synthroid,metoprolol/Toprol-XL,oxycodone/Percocet if needed, Rosuvastatin/Crestor  STOP ASPIRIN,ANTIINFLAMATORIES (IBUPROFEN,ALEVE,MOTRIN,ADVIL,GOODY'S POWDERS),HERBAL [place] 5-7 DAYS PRIOR TO SURGERY       How to Manage Your Diabetes Before and After Surgery  Why is it important to control my blood sugar before and after surgery? . Improving blood sugar levels before and after surgery helps healing and can limit problems. . A way of improving blood sugar control is eating a healthy diet by: o  Eating less sugar and carbohydrates o  Increasing activity/exercise o  Talking with your doctor about reaching your blood sugar goals . High blood sugars (greater than 180 mg/dL) can raise your risk of infections and slow your recovery, so you will need to focus on controlling your diabetes during the weeks before surgery. . Make sure that the doctor who takes care of your diabetes knows about your planned surgery including the date and location.  How do I manage my blood sugar before surgery? . Check your blood sugar at least 4 times a day, starting 2 days before surgery, to make sure that the level is not too high or low. o Check your blood sugar the morning of your surgery when you wake up and every 2 hours until you get to the Short Stay unit. . If your blood sugar is less than 70 mg/dL, you will need to treat  for low blood sugar: o Do not take insulin. o Treat a low blood sugar (less than 70 mg/dL) with  cup of clear juice (cranberry or apple), 4 glucose tablets, OR glucose gel. o Recheck blood sugar in 15 minutes after treatment (to make sure it is greater than 70 mg/dL). If your blood sugar is not greater than 70 mg/dL on recheck, call [phone] for further instructions. . Report your blood sugar to the short stay nurse when you get to Short Stay.  . If you are admitted to the hospital after surgery: o Your blood sugar will be checked by the staff and you will probably be given insulin after surgery (instead of oral diabetes medicines) to make sure you have good blood sugar levels. o The goal for blood sugar control after surgery is 80-180 mg/dL.              WHAT DO I DO ABOUT MY DIABETES MEDICATION?   Marland Kitchen Do not take oral diabetes medicines (pills) the morning of surgery.  . THE NIGHT BEFORE SURGERY, take _______42____ units of __50/50_insulin.       Marland Kitchen HE MORNING OF SURGERY, take _______0______ units of __50/50______insulin.  . The day of surgery, do not take other diabetes injectables, including Byetta (exenatide), Bydureon (exenatide ER), Victoza (liraglutide), or Trulicity (dulaglutide).        Date   Reviewed and Endorsed by [facility] Patient Education Committee, August 2015   Do not wear jewelry, make-up or nail polish.  Do not wear lotions, powders,  or perfumes, or deoderant.  Do not shave 48 hours prior to surgery.  Men may shave face and neck.  Do not bring valuables to the hospital.  [facility] is not responsible for any belongings or valuables.  Contacts, dentures or bridgework may not be worn into surgery.  Leave your suitcase in the car.  After surgery it may be brought to your room.  For patients admitted to the hospital, discharge time will be determined by your treatment team.  Patients discharged the day of surgery will not be allowed to drive home.      Special Instructions: [place] - Preparing for Surgery  Before surgery, you can play an important role.  Because skin is not sterile, your skin needs to be as free of germs as possible.  You can reduce the number of germs on you skin by washing with CHG (chlorahexidine gluconate) soap before surgery.  CHG is an antiseptic cleaner which kills germs and bonds with the skin to continue killing germs even after washing.  Please DO NOT use if you have an allergy to CHG or antibacterial soaps.  If your skin becomes reddened/irritated stop using the CHG and inform your nurse when you arrive at Short Stay.  Do not shave (including legs and underarms) for at least 48 hours prior to the first CHG shower.  You may shave your face.  Please follow these instructions carefully:   1.  Shower with CHG Soap the night before surgery and the   morning of Surgery.  2.  If you choose to wash your hair, wash your hair first as usual with your normal shampoo.  3.  After you shampoo, rinse your hair and body thoroughly to remove the  Shampoo.  4.  Use CHG as you would any other liquid soap.  You can apply chg directly  to the skin and wash gently with scrungie or a clean washcloth.  5.  Apply the CHG Soap to your body ONLY FROM THE NECK DOWN.   Do not use on open wounds or open sores.  Avoid contact with your eyes,  ears, mouth and genitals (private parts).  Wash genitals (private parts) with your normal soap.  6.  Wash thoroughly, paying special attention to the area where your surgery will be performed.  7.  Thoroughly rinse your body with warm water from the neck down.  8.  DO NOT shower/wash with your normal soap after using and rinsing o  the CHG Soap.  9.  Pat yourself dry with a clean towel.            10.  Wear clean pajamas.            11.  Place clean sheets on your bed the night of your first shower and do not sleep with pets.  Day of Surgery  Do not apply any lotions/deodorants the morning of  surgery.  Please wear clean clothes to the hospital/surgery center.   Please read over the following fact sheets that you were given. Coughing and Deep Breathing and Surgical Site Infection Prevention

## 2017-01-04 NOTE — Progress Notes (Addendum)
Anesthesia PAT Evaluation: Patient is a 81 year old female scheduled for diagnostic laparoscopy, Whipple procedure, feeding tube placement on 01/12/17 by Dr. [name]. Dx: Ampullary carcinoma. Anesthesia requested as general with possible epidural.   Dr. [name] requested anesthesia consult to assess if epidural would be possible given patient's prior history of lumbar surgery (L4-5 fusion, L3-4 laminotomies/foraminotomies).  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 Diona Fanti), L4-5 fusion using Ray cage technique '97, L3-4 laminotomies/foraminotomies 01/28/16. For anesthesia history, she reported bradycardia with anestheisa BMI is consistent with morbid obesity.   - PCP is Dr. Jeanie Cooks ([place] location). - Endocrinologist is Dr. Buddy Duty, although patient says Dr. Jeanie Cooks is now primarily managing DM.  (She is no longer seeing endocrinologist Dr. Buddy Duty. Dr. Jeanie Cooks is managing DM.) Prior to 2017 back surgery, he recommended a thyroid U/S with FNA due to enlarging left thyroid tissue on U/S (with known history of papillary thyroid cancer). She declined at that time.  - HEM-ONC is Dr. Benay Spice, last visit 11/23/16.  - Urologist is Dr. Diona Fanti. - Neurologist is Dr. Leonie Man with [facility], last visit 08/19/16 with PRN neurology follow-up recommended.  - She was seen by cardiologist Dr. Dorris Carnes for pre-operative evaluation (prior to lumbar surgery) on 01/29/16. She had a stress and echo (see below).  Meds include Plavix (last dose 01/04/17), Lasix, Humalog 50/50 90 Units AM and 60 Units PM, levothyroxine, Toprol XL, Percocet, KCl,  Crestor, telmisartan-HCTZ.  BP (!) 174/75   Pulse 60   Temp 36.6 C   Resp 20   Ht 5\' 4"  (1.626 m)   Wt 215 lb 4.8 oz (97.7 kg)   SpO2 99%   BMI 36.96 kg/m  Heart overall regular rhythm, but with occasional ectopic beats. No murmur noted. Lungs clear. She has large bilateral ankles with non-pitting edema (patient reports this is chronic and stable). She reports full dentures. She has a good appetite, with rare occasions of nausea. She is in a hospital wheelchair today, but lives alone. She thinks she will likely need SNF level rehab after surgery, but then her daughter will be able to stay with her some. Patient has an electric lift chair to get her to the second floor of her home.   EKG 07/15/16: SB at 57 bpm, minimal voltage criteria for LVH, may be normal variant.  Echo 01/30/16: Study Conclusions - Left ventricle: The cavity size was normal. There was mild  concentric hypertrophy. Systolic function was normal. The  estimated ejection fraction was in the range of 55% to 60%. Wall  motion was normal; there were no regional wall motion  abnormalities. Features are consistent with a pseudonormal left  ventricular filling pattern, with concomitant abnormal relaxation  and increased filling pressure (grade 2 diastolic dysfunction). - Aortic valve: There was trivial regurgitation. Mean gradient (S):  7 mm Hg. Peak gradient (S): 12 mm Hg. Valve area (VTI): 1.87  cm^2. Valve area (Vmax): 2.12 cm^2. Valve area (Vmean): 2 cm^2. - Left atrium: The atrium was mildly dilated.  Nuclear stress test 01/31/16:  The left ventricular ejection fraction is normal (55-65%).  Nuclear stress EF: 56%.  Findings consistent with prior myocardial infarction with peri-infarct ischemia.  This is a low risk study. Moderate area  of Inferior and inferolateral wall infarct with mild peri infarct ischemia. EF 56% Note the wrong patients MPI images were scanned into the chart. Dr. Harrington Challenger reviewed and  wrote, "No evidence of ischemia Possible inferior wal MI though this may just be some shadowing by diaphragm as echo shows normal wall motion I would not pursue other cardiac testing Pt is low risk for major heart event and OK to proceed with surgery."  Carotid U/S 09/12/14: Summary: Bilateral: 1-39% ICA stenosis. Vertebral artery flow is antegrade.  CT Chest 11/25/16: IMPRESSION: 1. No evidence pulmonary metastasis. 2. Coronary artery calcification and aortic atherosclerotic calcification. 3. Stable findings of pancreas with biliary stent in place. See CT 10/23/2016. 4. Retrocrural lymph node measuring 13 mm. Cannot exclude nodal metastasis.  Preoperative labs noted. CMET WNL except elevated glucose 290. A1c pending. She reports most home CBGs are < 200 (reported bedtime CBG 119 and morning CBG 283 after eating several peppermints candies to held with nausea). CBC WNL. PT 15.3, INR 1.20. T&C done.   Above discussed with anesthesiologist Dr. Conrad [place]. By notes, patient's fusion is at L4-5 level with laminotomy/foraminotomy at L3-4. Plavix will be held for at least 7 days. She should not be an absolute contraindication for epidural, but would likely require insertion above L4-5. Definitive anesthesia plan will be determined following evaluation by her assigned anesthesiologist on the day of surgery. I discussed this with patient. I will plan to update Dr. [name] once I get patient's A1c result back. Patient aware that a significantly elevated BP or glucose could cancel or delay an elective surgery. She will work on being more compliant with her dietary restrictions.   George Hugh [facility] Short Stay Center/Anesthesiology Phone [phone] 01/04/2017 5:22 PM  Addendum: A1c 9.9, consistent with average glucose of 237. UA showed small leukocytes, negative nitrites, many bacteria, 6-30 WBCs. I sent Dr. [name] a staff message regarding epidural, patient's A1c and UA results.   George Hugh [facility] Short Stay Center/Anesthesiology Phone [phone] 01/05/2017 9:42 AM

## 2017-01-04 NOTE — Progress Notes (Signed)
This lovely lady needs some cipro 500 mg BID for 3 days.

## 2017-01-05 LAB — HEMOGLOBIN A1C
Hgb A1c MFr Bld: 9.9 % — ABNORMAL HIGH (ref 4.8–5.6)
Mean Plasma Glucose: 237 mg/dL

## 2017-01-07 ENCOUNTER — Ambulatory Visit: Payer: Medicare Other

## 2017-01-12 ENCOUNTER — Inpatient Hospital Stay (HOSPITAL_COMMUNITY): Payer: Medicare Other | Admitting: Anesthesiology

## 2017-01-12 ENCOUNTER — Inpatient Hospital Stay (HOSPITAL_COMMUNITY): Payer: Medicare Other | Admitting: Vascular Surgery

## 2017-01-12 ENCOUNTER — Encounter (HOSPITAL_COMMUNITY)
Admission: RE | Disposition: A | Discharge status: Skilled Nursing Facility | Payer: Self-pay | Source: Ambulatory Visit | Attending: General Surgery

## 2017-01-12 ENCOUNTER — Inpatient Hospital Stay (HOSPITAL_COMMUNITY)
Admission: RE | Admit: 2017-01-12 | Discharge: 2017-02-05 | DRG: 405 | Disposition: A | Discharge status: Skilled Nursing Facility | Payer: Medicare Other | Source: Ambulatory Visit | Attending: General Surgery | Admitting: General Surgery

## 2017-01-12 ENCOUNTER — Encounter (HOSPITAL_COMMUNITY): Payer: Self-pay | Admitting: *Deleted

## 2017-01-12 ENCOUNTER — Inpatient Hospital Stay (HOSPITAL_COMMUNITY): Payer: Medicare Other

## 2017-01-12 DIAGNOSIS — E1122 Type 2 diabetes mellitus with diabetic chronic kidney disease: Secondary | ICD-10-CM | POA: Diagnosis present

## 2017-01-12 DIAGNOSIS — K567 Ileus, unspecified: Secondary | ICD-10-CM | POA: Diagnosis not present

## 2017-01-12 DIAGNOSIS — Z79899 Other long term (current) drug therapy: Secondary | ICD-10-CM | POA: Diagnosis not present

## 2017-01-12 DIAGNOSIS — I471 Supraventricular tachycardia: Secondary | ICD-10-CM | POA: Diagnosis not present

## 2017-01-12 DIAGNOSIS — I1310 Hypertensive heart and chronic kidney disease without heart failure, with stage 1 through stage 4 chronic kidney disease, or unspecified chronic kidney disease: Secondary | ICD-10-CM | POA: Diagnosis present

## 2017-01-12 DIAGNOSIS — D62 Acute posthemorrhagic anemia: Secondary | ICD-10-CM | POA: Diagnosis present

## 2017-01-12 DIAGNOSIS — K3184 Gastroparesis: Secondary | ICD-10-CM | POA: Diagnosis present

## 2017-01-12 DIAGNOSIS — Z833 Family history of diabetes mellitus: Secondary | ICD-10-CM

## 2017-01-12 DIAGNOSIS — Z452 Encounter for adjustment and management of vascular access device: Secondary | ICD-10-CM

## 2017-01-12 DIAGNOSIS — C801 Malignant (primary) neoplasm, unspecified: Secondary | ICD-10-CM | POA: Diagnosis present

## 2017-01-12 DIAGNOSIS — E1165 Type 2 diabetes mellitus with hyperglycemia: Secondary | ICD-10-CM | POA: Diagnosis present

## 2017-01-12 DIAGNOSIS — Z808 Family history of malignant neoplasm of other organs or systems: Secondary | ICD-10-CM | POA: Diagnosis not present

## 2017-01-12 DIAGNOSIS — E43 Unspecified severe protein-calorie malnutrition: Secondary | ICD-10-CM | POA: Diagnosis present

## 2017-01-12 DIAGNOSIS — R0602 Shortness of breath: Secondary | ICD-10-CM

## 2017-01-12 DIAGNOSIS — I82432 Acute embolism and thrombosis of left popliteal vein: Secondary | ICD-10-CM | POA: Diagnosis not present

## 2017-01-12 DIAGNOSIS — Z818 Family history of other mental and behavioral disorders: Secondary | ICD-10-CM

## 2017-01-12 DIAGNOSIS — N189 Chronic kidney disease, unspecified: Secondary | ICD-10-CM | POA: Diagnosis present

## 2017-01-12 DIAGNOSIS — I69354 Hemiplegia and hemiparesis following cerebral infarction affecting left non-dominant side: Secondary | ICD-10-CM

## 2017-01-12 DIAGNOSIS — C241 Malignant neoplasm of ampulla of Vater: Principal | ICD-10-CM | POA: Diagnosis present

## 2017-01-12 DIAGNOSIS — Z794 Long term (current) use of insulin: Secondary | ICD-10-CM | POA: Diagnosis not present

## 2017-01-12 DIAGNOSIS — Z90710 Acquired absence of both cervix and uterus: Secondary | ICD-10-CM | POA: Diagnosis not present

## 2017-01-12 DIAGNOSIS — N179 Acute kidney failure, unspecified: Secondary | ICD-10-CM | POA: Diagnosis present

## 2017-01-12 DIAGNOSIS — E1143 Type 2 diabetes mellitus with diabetic autonomic (poly)neuropathy: Secondary | ICD-10-CM | POA: Diagnosis present

## 2017-01-12 DIAGNOSIS — Z811 Family history of alcohol abuse and dependence: Secondary | ICD-10-CM | POA: Diagnosis not present

## 2017-01-12 DIAGNOSIS — Z85850 Personal history of malignant neoplasm of thyroid: Secondary | ICD-10-CM | POA: Diagnosis not present

## 2017-01-12 DIAGNOSIS — R109 Unspecified abdominal pain: Secondary | ICD-10-CM | POA: Diagnosis not present

## 2017-01-12 DIAGNOSIS — Z8249 Family history of ischemic heart disease and other diseases of the circulatory system: Secondary | ICD-10-CM

## 2017-01-12 DIAGNOSIS — K30 Functional dyspepsia: Secondary | ICD-10-CM

## 2017-01-12 DIAGNOSIS — E890 Postprocedural hypothyroidism: Secondary | ICD-10-CM | POA: Diagnosis present

## 2017-01-12 DIAGNOSIS — R140 Abdominal distension (gaseous): Secondary | ICD-10-CM

## 2017-01-12 DIAGNOSIS — I130 Hypertensive heart and chronic kidney disease with heart failure and stage 1 through stage 4 chronic kidney disease, or unspecified chronic kidney disease: Secondary | ICD-10-CM | POA: Diagnosis not present

## 2017-01-12 DIAGNOSIS — Z823 Family history of stroke: Secondary | ICD-10-CM

## 2017-01-12 DIAGNOSIS — R609 Edema, unspecified: Secondary | ICD-10-CM | POA: Diagnosis not present

## 2017-01-12 DIAGNOSIS — I10 Essential (primary) hypertension: Secondary | ICD-10-CM | POA: Diagnosis not present

## 2017-01-12 HISTORY — DX: Malignant (primary) neoplasm, unspecified: C80.1

## 2017-01-12 HISTORY — PX: WHIPPLE PROCEDURE: SHX2667

## 2017-01-12 HISTORY — PX: LAPAROSCOPY: SHX197

## 2017-01-12 LAB — GLUCOSE, CAPILLARY
Glucose-Capillary: 139 mg/dL — ABNORMAL HIGH (ref 65–99)
Glucose-Capillary: 267 mg/dL — ABNORMAL HIGH (ref 65–99)
Glucose-Capillary: 281 mg/dL — ABNORMAL HIGH (ref 65–99)
Glucose-Capillary: 310 mg/dL — ABNORMAL HIGH (ref 65–99)

## 2017-01-12 LAB — POCT I-STAT 7, (LYTES, BLD GAS, ICA,H+H)
Bicarbonate: 24.9 mmol/L (ref 20.0–28.0)
Calcium, Ion: 1.12 mmol/L — ABNORMAL LOW (ref 1.15–1.40)
HCT: 26 % — ABNORMAL LOW (ref 36.0–46.0)
Hemoglobin: 8.8 g/dL — ABNORMAL LOW (ref 12.0–15.0)
O2 Saturation: 99 %
Patient temperature: 35
Potassium: 3 mmol/L — ABNORMAL LOW (ref 3.5–5.1)
Sodium: 141 mmol/L (ref 135–145)
TCO2: 26 mmol/L (ref 0–100)
pCO2 arterial: 36.9 mmHg (ref 32.0–48.0)
pH, Arterial: 7.43 (ref 7.350–7.450)
pO2, Arterial: 155 mmHg — ABNORMAL HIGH (ref 83.0–108.0)

## 2017-01-12 LAB — PREPARE RBC (CROSSMATCH)

## 2017-01-12 SURGERY — LAPAROSCOPY, DIAGNOSTIC
Anesthesia: General | Site: Abdomen

## 2017-01-12 MED ORDER — ONDANSETRON HCL 4 MG/2ML IJ SOLN
INTRAMUSCULAR | Status: AC
  Filled 2017-01-12: qty 2

## 2017-01-12 MED ORDER — HYDROMORPHONE HCL 1 MG/ML IJ SOLN: 0.2500 mg | INTRAMUSCULAR | Status: DC | PRN

## 2017-01-12 MED ORDER — 0.9 % SODIUM CHLORIDE (POUR BTL) OPTIME
TOPICAL | Status: DC | PRN
  Administered 2017-01-12: 1000 mL

## 2017-01-12 MED ORDER — CHLORHEXIDINE GLUCONATE CLOTH 2 % EX PADS: 6.0000 | MEDICATED_PAD | Freq: Once | CUTANEOUS | Status: DC

## 2017-01-12 MED ORDER — FENTANYL CITRATE (PF) 250 MCG/5ML IJ SOLN
INTRAMUSCULAR | Status: AC
  Filled 2017-01-12: qty 5

## 2017-01-12 MED ORDER — MEPERIDINE HCL 25 MG/ML IJ SOLN: 6.2500 mg | INTRAMUSCULAR | Status: DC | PRN

## 2017-01-12 MED ORDER — EPHEDRINE 5 MG/ML INJ
INTRAVENOUS | Status: AC
  Filled 2017-01-12: qty 10

## 2017-01-12 MED ORDER — ROPIVACAINE HCL 2 MG/ML IJ SOLN
10.0000 mL/h | INTRAMUSCULAR | Status: DC
  Filled 2017-01-12 (×2): qty 200

## 2017-01-12 MED ORDER — LIDOCAINE HCL 1 % IJ SOLN
INTRAMUSCULAR | Status: AC
  Filled 2017-01-12: qty 20

## 2017-01-12 MED ORDER — PROPOFOL 10 MG/ML IV BOLUS
INTRAVENOUS | Status: AC
  Filled 2017-01-12: qty 20

## 2017-01-12 MED ORDER — ACETAMINOPHEN 325 MG PO TABS
650.0000 mg | ORAL_TABLET | Freq: Four times a day (QID) | ORAL | Status: DC | PRN
  Administered 2017-01-20 – 2017-02-03 (×4): 650 mg via ORAL
  Filled 2017-01-12 (×4): qty 2

## 2017-01-12 MED ORDER — ONDANSETRON HCL 4 MG/2ML IJ SOLN: 4.0000 mg | Freq: Four times a day (QID) | INTRAMUSCULAR | Status: DC | PRN

## 2017-01-12 MED ORDER — ACETAMINOPHEN 10 MG/ML IV SOLN
1000.0000 mg | Freq: Four times a day (QID) | INTRAVENOUS | Status: AC
  Administered 2017-01-12 – 2017-01-13 (×4): 1000 mg via INTRAVENOUS
  Filled 2017-01-12 (×4): qty 100

## 2017-01-12 MED ORDER — FENTANYL 40 MCG/ML IV SOLN
INTRAVENOUS | Status: DC
  Administered 2017-01-12: 1000 ug via INTRAVENOUS
  Filled 2017-01-12: qty 25

## 2017-01-12 MED ORDER — METOPROLOL TARTRATE 5 MG/5ML IV SOLN
7.5000 mg | Freq: Four times a day (QID) | INTRAVENOUS | Status: DC
  Administered 2017-01-13 – 2017-01-15 (×6): 7.5 mg via INTRAVENOUS
  Filled 2017-01-12 (×6): qty 10

## 2017-01-12 MED ORDER — FENTANYL CITRATE (PF) 100 MCG/2ML IJ SOLN: 12.5000 ug | INTRAMUSCULAR | Status: DC | PRN

## 2017-01-12 MED ORDER — STERILE WATER FOR IRRIGATION IR SOLN
Status: DC | PRN
  Administered 2017-01-12: 2000 mL

## 2017-01-12 MED ORDER — ROCURONIUM BROMIDE 10 MG/ML (PF) SYRINGE
PREFILLED_SYRINGE | INTRAVENOUS | Status: AC
  Filled 2017-01-12: qty 5

## 2017-01-12 MED ORDER — SUCCINYLCHOLINE CHLORIDE 200 MG/10ML IV SOSY
PREFILLED_SYRINGE | INTRAVENOUS | Status: AC
  Filled 2017-01-12: qty 10

## 2017-01-12 MED ORDER — ONDANSETRON HCL 4 MG/2ML IJ SOLN
INTRAMUSCULAR | Status: DC | PRN
  Administered 2017-01-12: 4 mg via INTRAVENOUS

## 2017-01-12 MED ORDER — DIPHENHYDRAMINE HCL 50 MG/ML IJ SOLN: 12.5000 mg | Freq: Four times a day (QID) | INTRAMUSCULAR | Status: DC | PRN

## 2017-01-12 MED ORDER — ONDANSETRON HCL 4 MG/2ML IJ SOLN: 4.0000 mg | Freq: Once | INTRAMUSCULAR | Status: DC | PRN

## 2017-01-12 MED ORDER — DIPHENHYDRAMINE HCL 12.5 MG/5ML PO ELIX: 12.5000 mg | ORAL_SOLUTION | Freq: Four times a day (QID) | ORAL | Status: DC | PRN

## 2017-01-12 MED ORDER — ONDANSETRON HCL 4 MG/2ML IJ SOLN
4.0000 mg | Freq: Four times a day (QID) | INTRAMUSCULAR | Status: DC | PRN
  Administered 2017-01-15 – 2017-02-05 (×34): 4 mg via INTRAVENOUS
  Filled 2017-01-12 (×34): qty 2

## 2017-01-12 MED ORDER — DEXTROSE 5 % IV SOLN
INTRAVENOUS | Status: DC | PRN
  Administered 2017-01-12: 20 ug/min via INTRAVENOUS

## 2017-01-12 MED ORDER — CEFAZOLIN SODIUM-DEXTROSE 2-4 GM/100ML-% IV SOLN
INTRAVENOUS | Status: AC
  Filled 2017-01-12: qty 100

## 2017-01-12 MED ORDER — PANTOPRAZOLE SODIUM 40 MG IV SOLR
40.0000 mg | Freq: Every day | INTRAVENOUS | Status: DC
  Administered 2017-01-12 – 2017-01-24 (×13): 40 mg via INTRAVENOUS
  Filled 2017-01-12 (×13): qty 40

## 2017-01-12 MED ORDER — LIDOCAINE HCL (CARDIAC) 20 MG/ML IV SOLN
INTRAVENOUS | Status: DC | PRN
  Administered 2017-01-12: 100 mg via INTRAVENOUS

## 2017-01-12 MED ORDER — LEVOTHYROXINE SODIUM 100 MCG IV SOLR
100.0000 ug | Freq: Every day | INTRAVENOUS | Status: DC
  Administered 2017-01-12 – 2017-01-20 (×9): 100 ug via INTRAVENOUS
  Filled 2017-01-12 (×9): qty 5

## 2017-01-12 MED ORDER — SODIUM CHLORIDE 0.9% FLUSH: 9.0000 mL | INTRAVENOUS | Status: DC | PRN

## 2017-01-12 MED ORDER — CHLORHEXIDINE GLUCONATE CLOTH 2 % EX PADS
6.0000 | MEDICATED_PAD | Freq: Once | CUTANEOUS | Status: DC
Start: 2017-01-12 — End: 2017-01-12

## 2017-01-12 MED ORDER — PROPOFOL 10 MG/ML IV BOLUS
INTRAVENOUS | Status: DC | PRN
  Administered 2017-01-12: 100 mg via INTRAVENOUS

## 2017-01-12 MED ORDER — EVICEL 5 ML EX KIT
PACK | CUTANEOUS | Status: DC | PRN
  Administered 2017-01-12: 5 mL

## 2017-01-12 MED ORDER — DIPHENHYDRAMINE HCL 12.5 MG/5ML PO ELIX
12.5000 mg | ORAL_SOLUTION | Freq: Four times a day (QID) | ORAL | Status: DC | PRN
  Filled 2017-01-12: qty 5

## 2017-01-12 MED ORDER — FENTANYL CITRATE (PF) 100 MCG/2ML IJ SOLN
12.5000 ug | INTRAMUSCULAR | Status: DC | PRN
  Administered 2017-01-13 (×4): 50 ug via INTRAVENOUS
  Administered 2017-01-13: 25 ug via INTRAVENOUS
  Administered 2017-01-14 – 2017-01-19 (×15): 50 ug via INTRAVENOUS
  Administered 2017-01-29 – 2017-02-04 (×2): 25 ug via INTRAVENOUS
  Filled 2017-01-12 (×24): qty 2

## 2017-01-12 MED ORDER — NALOXONE HCL 0.4 MG/ML IJ SOLN: 0.4000 mg | INTRAMUSCULAR | Status: DC | PRN

## 2017-01-12 MED ORDER — METHOCARBAMOL 1000 MG/10ML IJ SOLN
500.0000 mg | Freq: Three times a day (TID) | INTRAMUSCULAR | Status: DC | PRN
  Administered 2017-01-13 – 2017-01-14 (×2): 500 mg via INTRAVENOUS
  Filled 2017-01-12 (×5): qty 5

## 2017-01-12 MED ORDER — EPHEDRINE SULFATE 50 MG/ML IJ SOLN
INTRAMUSCULAR | Status: DC | PRN
  Administered 2017-01-12: 10 mg via INTRAVENOUS
  Administered 2017-01-12: 5 mg via INTRAVENOUS
  Administered 2017-01-12: 15 mg via INTRAVENOUS

## 2017-01-12 MED ORDER — HYDRALAZINE HCL 20 MG/ML IJ SOLN
20.0000 mg | INTRAMUSCULAR | Status: DC | PRN
  Administered 2017-01-14: 20 mg via INTRAVENOUS
  Filled 2017-01-12 (×2): qty 1

## 2017-01-12 MED ORDER — LABETALOL HCL 5 MG/ML IV SOLN
INTRAVENOUS | Status: DC | PRN
  Administered 2017-01-12: 5 mg via INTRAVENOUS

## 2017-01-12 MED ORDER — CEFAZOLIN SODIUM-DEXTROSE 2-4 GM/100ML-% IV SOLN
2.0000 g | Freq: Three times a day (TID) | INTRAVENOUS | Status: AC
  Administered 2017-01-12 (×2): 2 g via INTRAVENOUS
  Filled 2017-01-12 (×2): qty 100

## 2017-01-12 MED ORDER — MIDAZOLAM HCL 2 MG/2ML IJ SOLN
INTRAMUSCULAR | Status: AC
  Filled 2017-01-12: qty 2

## 2017-01-12 MED ORDER — BUPIVACAINE 0.25 % ON-Q PUMP DUAL CATH 300 ML
300.0000 mL | INJECTION | Status: DC
  Filled 2017-01-12: qty 300

## 2017-01-12 MED ORDER — SUGAMMADEX SODIUM 200 MG/2ML IV SOLN
INTRAVENOUS | Status: DC | PRN
  Administered 2017-01-12: 200 mg via INTRAVENOUS

## 2017-01-12 MED ORDER — ONDANSETRON 4 MG PO TBDP
4.0000 mg | ORAL_TABLET | Freq: Four times a day (QID) | ORAL | Status: DC | PRN
  Administered 2017-01-27: 4 mg via ORAL
  Filled 2017-01-12: qty 1

## 2017-01-12 MED ORDER — FUROSEMIDE 10 MG/ML IJ SOLN
40.0000 mg | Freq: Every day | INTRAMUSCULAR | Status: DC
  Administered 2017-01-14 – 2017-01-20 (×7): 40 mg via INTRAVENOUS
  Filled 2017-01-12 (×9): qty 4

## 2017-01-12 MED ORDER — DIPHENHYDRAMINE HCL 50 MG/ML IJ SOLN
12.5000 mg | Freq: Four times a day (QID) | INTRAMUSCULAR | Status: DC | PRN
  Administered 2017-01-13: 12.5 mg via INTRAVENOUS
  Filled 2017-01-12: qty 1

## 2017-01-12 MED ORDER — LACTATED RINGERS IV SOLN
INTRAVENOUS | Status: DC | PRN
  Administered 2017-01-12 (×3): via INTRAVENOUS

## 2017-01-12 MED ORDER — ROPIVACAINE HCL 2 MG/ML IJ SOLN
14.0000 mL/h | INTRAMUSCULAR | Status: DC
  Administered 2017-01-13 (×2): 10 mL/h via EPIDURAL
  Filled 2017-01-12 (×4): qty 200

## 2017-01-12 MED ORDER — ACETAMINOPHEN 650 MG RE SUPP: 650.0000 mg | Freq: Four times a day (QID) | RECTAL | Status: DC | PRN

## 2017-01-12 MED ORDER — MIDAZOLAM HCL 5 MG/5ML IJ SOLN
INTRAMUSCULAR | Status: DC | PRN
  Administered 2017-01-12: 2 mg via INTRAVENOUS

## 2017-01-12 MED ORDER — CEFAZOLIN SODIUM-DEXTROSE 2-4 GM/100ML-% IV SOLN
2.0000 g | INTRAVENOUS | Status: AC
  Administered 2017-01-12 (×2): 2 g via INTRAVENOUS
  Filled 2017-01-12: qty 100

## 2017-01-12 MED ORDER — LIDOCAINE-EPINEPHRINE (PF) 2 %-1:200000 IJ SOLN
INTRAMUSCULAR | Status: DC | PRN
  Administered 2017-01-12: 10 mL

## 2017-01-12 MED ORDER — PHENYLEPHRINE 40 MCG/ML (10ML) SYRINGE FOR IV PUSH (FOR BLOOD PRESSURE SUPPORT)
PREFILLED_SYRINGE | INTRAVENOUS | Status: AC
  Filled 2017-01-12: qty 10

## 2017-01-12 MED ORDER — ROCURONIUM BROMIDE 100 MG/10ML IV SOLN
INTRAVENOUS | Status: DC | PRN
  Administered 2017-01-12 (×6): 10 mg via INTRAVENOUS
  Administered 2017-01-12: 50 mg via INTRAVENOUS

## 2017-01-12 MED ORDER — ALBUMIN HUMAN 5 % IV SOLN
INTRAVENOUS | Status: DC | PRN
  Administered 2017-01-12: 11:00:00 via INTRAVENOUS

## 2017-01-12 MED ORDER — SODIUM CHLORIDE 0.9 % IV SOLN: Freq: Once | INTRAVENOUS | Status: DC

## 2017-01-12 MED ORDER — PHENYLEPHRINE 40 MCG/ML (10ML) SYRINGE FOR IV PUSH (FOR BLOOD PRESSURE SUPPORT)
PREFILLED_SYRINGE | INTRAVENOUS | Status: DC | PRN
  Administered 2017-01-12 (×4): 80 ug via INTRAVENOUS
  Administered 2017-01-12: 40 ug via INTRAVENOUS
  Administered 2017-01-12 (×3): 80 ug via INTRAVENOUS
  Administered 2017-01-12: 40 ug via INTRAVENOUS
  Administered 2017-01-12: 80 ug via INTRAVENOUS

## 2017-01-12 MED ORDER — LIDOCAINE 2% (20 MG/ML) 5 ML SYRINGE
INTRAMUSCULAR | Status: AC
  Filled 2017-01-12: qty 5

## 2017-01-12 MED ORDER — FENTANYL CITRATE (PF) 100 MCG/2ML IJ SOLN
INTRAMUSCULAR | Status: DC | PRN
Start: 2017-01-12 — End: 2017-01-12
  Administered 2017-01-12: 150 ug via INTRAVENOUS
  Administered 2017-01-12: 100 ug via INTRAVENOUS

## 2017-01-12 MED ORDER — BUPIVACAINE HCL (PF) 0.25 % IJ SOLN
INTRAMUSCULAR | Status: AC
  Filled 2017-01-12: qty 30

## 2017-01-12 MED ORDER — INSULIN ASPART 100 UNIT/ML ~~LOC~~ SOLN
0.0000 [IU] | SUBCUTANEOUS | Status: DC
  Administered 2017-01-12: 11 [IU] via SUBCUTANEOUS
  Administered 2017-01-12: 8 [IU] via SUBCUTANEOUS
  Administered 2017-01-13: 11 [IU] via SUBCUTANEOUS
  Administered 2017-01-13 (×2): 8 [IU] via SUBCUTANEOUS
  Administered 2017-01-13: 11 [IU] via SUBCUTANEOUS
  Administered 2017-01-13 (×2): 8 [IU] via SUBCUTANEOUS
  Administered 2017-01-13 – 2017-01-14 (×4): 5 [IU] via SUBCUTANEOUS
  Administered 2017-01-14 (×2): 3 [IU] via SUBCUTANEOUS
  Administered 2017-01-14: 8 [IU] via SUBCUTANEOUS
  Administered 2017-01-15 (×2): 5 [IU] via SUBCUTANEOUS
  Administered 2017-01-15 – 2017-01-18 (×5): 2 [IU] via SUBCUTANEOUS
  Administered 2017-01-19: 3 [IU] via SUBCUTANEOUS
  Administered 2017-01-20 (×2): 2 [IU] via SUBCUTANEOUS
  Administered 2017-01-20: 3 [IU] via SUBCUTANEOUS
  Administered 2017-01-20: 2 [IU] via SUBCUTANEOUS
  Administered 2017-01-21: 3 [IU] via SUBCUTANEOUS
  Administered 2017-01-21 – 2017-01-23 (×3): 2 [IU] via SUBCUTANEOUS
  Administered 2017-01-24 (×4): 5 [IU] via SUBCUTANEOUS
  Administered 2017-01-24: 3 [IU] via SUBCUTANEOUS
  Administered 2017-01-25: 5 [IU] via SUBCUTANEOUS
  Administered 2017-01-25: 3 [IU] via SUBCUTANEOUS
  Administered 2017-01-25: 5 [IU] via SUBCUTANEOUS

## 2017-01-12 MED ORDER — SUGAMMADEX SODIUM 200 MG/2ML IV SOLN
INTRAVENOUS | Status: AC
  Filled 2017-01-12: qty 2

## 2017-01-12 MED ORDER — EVICEL 5 ML EX KIT
PACK | CUTANEOUS | Status: AC
  Filled 2017-01-12: qty 1

## 2017-01-12 MED ORDER — LACTATED RINGERS IV SOLN
INTRAVENOUS | Status: DC | PRN
  Administered 2017-01-12 (×2): via INTRAVENOUS

## 2017-01-12 MED ORDER — KCL IN DEXTROSE-NACL 20-5-0.45 MEQ/L-%-% IV SOLN
INTRAVENOUS | Status: DC
  Administered 2017-01-12 – 2017-01-13 (×2): via INTRAVENOUS
  Administered 2017-01-14: 1000 mL via INTRAVENOUS
  Administered 2017-01-14: 21:00:00 via INTRAVENOUS
  Filled 2017-01-12 (×7): qty 1000

## 2017-01-12 SURGICAL SUPPLY — 121 items
BAG BILE T-TUBES STRL (MISCELLANEOUS) ×8 IMPLANT
BLADE CLIPPER SURG (BLADE) IMPLANT
BLADE SURG 11 STRL SS (BLADE) ×4 IMPLANT
BOOT SUTURE AID YELLOW STND (SUTURE) ×8 IMPLANT
CANISTER SUCT 3000ML PPV (MISCELLANEOUS) ×8 IMPLANT
CATH KIT ON Q 7.5IN SLV (PAIN MANAGEMENT) ×8 IMPLANT
CATH ROBINSON RED A/P 16FR (CATHETERS) IMPLANT
CHLORAPREP W/TINT 26ML (MISCELLANEOUS) ×4 IMPLANT
CLIP LIGATING HEM O LOK PURPLE (MISCELLANEOUS) ×16 IMPLANT
CLIP LIGATING HEMO O LOK GREEN (MISCELLANEOUS) ×4 IMPLANT
CLIP LIGATING HEMOLOK MED (MISCELLANEOUS) ×4 IMPLANT
CLIP TI LARGE 6 (CLIP) ×4 IMPLANT
CLIP TI MEDIUM 24 (CLIP) ×4 IMPLANT
CONT SPEC 4OZ CLIKSEAL STRL BL (MISCELLANEOUS) ×4 IMPLANT
COVER MAYO STAND STRL (DRAPES) ×4 IMPLANT
COVER SURGICAL LIGHT HANDLE (MISCELLANEOUS) ×4 IMPLANT
DECANTER SPIKE VIAL GLASS SM (MISCELLANEOUS) ×8 IMPLANT
DERMABOND ADVANCED (GAUZE/BANDAGES/DRESSINGS)
DERMABOND ADVANCED .7 DNX12 (GAUZE/BANDAGES/DRESSINGS) IMPLANT
DRAIN CHANNEL 19F RND (DRAIN) ×8 IMPLANT
DRAIN PENROSE 1/2X36 STERILE (WOUND CARE) IMPLANT
DRAPE LAPAROSCOPIC ABDOMINAL (DRAPES) ×4 IMPLANT
DRAPE UTILITY XL STRL (DRAPES) ×8 IMPLANT
DRAPE WARM FLUID 44X44 (DRAPE) ×4 IMPLANT
DRSG COVADERM 4X10 (GAUZE/BANDAGES/DRESSINGS) IMPLANT
DRSG COVADERM 4X14 (GAUZE/BANDAGES/DRESSINGS) ×4 IMPLANT
DRSG COVADERM 4X6 (GAUZE/BANDAGES/DRESSINGS) IMPLANT
DRSG COVADERM 4X8 (GAUZE/BANDAGES/DRESSINGS) IMPLANT
DRSG TELFA 3X8 NADH (GAUZE/BANDAGES/DRESSINGS) IMPLANT
ELECT BLADE 4.0 EZ CLEAN MEGAD (MISCELLANEOUS) ×4
ELECT BLADE 6.5 EXT (BLADE) ×4 IMPLANT
ELECT CAUTERY BLADE 6.4 (BLADE) ×4 IMPLANT
ELECT REM PT RETURN 9FT ADLT (ELECTROSURGICAL) ×4
ELECTRODE BLDE 4.0 EZ CLN MEGD (MISCELLANEOUS) ×2 IMPLANT
ELECTRODE REM PT RTRN 9FT ADLT (ELECTROSURGICAL) ×2 IMPLANT
GAUZE SPONGE 4X4 12PLY STRL (GAUZE/BANDAGES/DRESSINGS) ×4 IMPLANT
GAUZE SPONGE 4X4 16PLY XRAY LF (GAUZE/BANDAGES/DRESSINGS) IMPLANT
GEL ULTRASOUND 20GR AQUASONIC (MISCELLANEOUS) ×4 IMPLANT
GLOVE BIO SURGEON STRL SZ 6 (GLOVE) ×8 IMPLANT
GLOVE BIO SURGEON STRL SZ7.5 (GLOVE) ×4 IMPLANT
GLOVE BIOGEL PI IND STRL 6.5 (GLOVE) ×6 IMPLANT
GLOVE BIOGEL PI IND STRL 8 (GLOVE) ×2 IMPLANT
GLOVE BIOGEL PI INDICATOR 6.5 (GLOVE) ×6
GLOVE BIOGEL PI INDICATOR 8 (GLOVE) ×2
GLOVE INDICATOR 6.5 STRL GRN (GLOVE) ×8 IMPLANT
GOWN STRL REUS W/ TWL LRG LVL3 (GOWN DISPOSABLE) ×4 IMPLANT
GOWN STRL REUS W/ TWL XL LVL3 (GOWN DISPOSABLE) ×2 IMPLANT
GOWN STRL REUS W/TWL 2XL LVL3 (GOWN DISPOSABLE) ×8 IMPLANT
GOWN STRL REUS W/TWL LRG LVL3 (GOWN DISPOSABLE) ×4
GOWN STRL REUS W/TWL XL LVL3 (GOWN DISPOSABLE) ×2
HEMOSTAT SURGICEL 2X14 (HEMOSTASIS) IMPLANT
KIT BASIN OR (CUSTOM PROCEDURE TRAY) ×4 IMPLANT
KIT MARKER MARGIN INK (KITS) ×4 IMPLANT
KIT ROOM TURNOVER OR (KITS) ×4 IMPLANT
KIT TOURNIQUET VASCULAR (KITS) ×4 IMPLANT
L-HOOK LAP DISP 36CM (ELECTROSURGICAL) ×4
LHOOK LAP DISP 36CM (ELECTROSURGICAL) ×2 IMPLANT
LOOP VESSEL MAXI BLUE (MISCELLANEOUS) ×4 IMPLANT
LOOP VESSEL MINI RED (MISCELLANEOUS) ×4 IMPLANT
NEEDLE BIOPSY 14X6 SOFT TISS (NEEDLE) IMPLANT
NS IRRIG 1000ML POUR BTL (IV SOLUTION) ×8 IMPLANT
PACK GENERAL/GYN (CUSTOM PROCEDURE TRAY) ×4 IMPLANT
PAD ARMBOARD 7.5X6 YLW CONV (MISCELLANEOUS) ×8 IMPLANT
PAD SHARPS MAGNETIC DISPOSAL (MISCELLANEOUS) IMPLANT
PENCIL BUTTON HOLSTER BLD 10FT (ELECTRODE) IMPLANT
PLUG CATH AND CAP STER (CATHETERS) IMPLANT
RELOAD PROXIMATE 75MM BLUE (ENDOMECHANICALS) ×20 IMPLANT
RELOAD PROXIMATE 75MM GREEN (ENDOMECHANICALS) IMPLANT
RELOAD STAPLER LINEAR PROX 30 (STAPLE) ×2 IMPLANT
SCISSORS LAP 5X35 DISP (ENDOMECHANICALS) IMPLANT
SEPRAFILM PROCEDURAL PACK 3X5 (MISCELLANEOUS) IMPLANT
SET IRRIG TUBING LAPAROSCOPIC (IRRIGATION / IRRIGATOR) IMPLANT
SHEARS FOC LG CVD HARMONIC 17C (MISCELLANEOUS) ×4 IMPLANT
SLEEVE ENDOPATH XCEL 5M (ENDOMECHANICALS) ×4 IMPLANT
SPECIMEN JAR X LARGE (MISCELLANEOUS) ×4 IMPLANT
SPONGE INTESTINAL PEANUT (DISPOSABLE) IMPLANT
SPONGE LAP 18X18 X RAY DECT (DISPOSABLE) ×16 IMPLANT
SPONGE SURGIFOAM ABS GEL 100 (HEMOSTASIS) IMPLANT
STAPLER ECHELON FLEX (STAPLE) IMPLANT
STAPLER PROXIMATE 75MM BLUE (STAPLE) ×8 IMPLANT
STAPLER RELOAD LINEAR PROX 30 (STAPLE) ×4
STAPLER VISISTAT 35W (STAPLE) ×4 IMPLANT
SUCTION POOLE TIP (SUCTIONS) ×4 IMPLANT
SUT 5.0 PDS RB-1 (SUTURE) ×14
SUT ETHILON 2 0 FS 18 (SUTURE) ×8 IMPLANT
SUT ETHILON 2 LR (SUTURE) IMPLANT
SUT MNCRL AB 4-0 PS2 18 (SUTURE) ×4 IMPLANT
SUT PDS AB 1 TP1 96 (SUTURE) ×12 IMPLANT
SUT PDS AB 3-0 SH 27 (SUTURE) ×16 IMPLANT
SUT PDS AB 4-0 RB1 27 (SUTURE) ×44 IMPLANT
SUT PDS II 0 TP-1 LOOPED 60 (SUTURE) ×8 IMPLANT
SUT PDS PLUS AB 5-0 RB-1 (SUTURE) ×14 IMPLANT
SUT PROLENE 3 0 SH 48 (SUTURE) ×16 IMPLANT
SUT PROLENE 4 0 RB 1 (SUTURE) ×4
SUT PROLENE 4-0 RB1 .5 CRCL 36 (SUTURE) ×4 IMPLANT
SUT PROLENE 5 0 RB 1 DA (SUTURE) ×8 IMPLANT
SUT SILK 2 0 SH CR/8 (SUTURE) ×12 IMPLANT
SUT SILK 2 0 TIES 10X30 (SUTURE) ×4 IMPLANT
SUT SILK 3 0 SH CR/8 (SUTURE) ×4 IMPLANT
SUT SILK 3 0 TIES 10X30 (SUTURE) ×4 IMPLANT
SUT VIC AB 2-0 CT1 27 (SUTURE)
SUT VIC AB 2-0 CT1 TAPERPNT 27 (SUTURE) IMPLANT
SUT VIC AB 2-0 SH 18 (SUTURE) ×4 IMPLANT
SUT VIC AB 3-0 SH 18 (SUTURE) ×4 IMPLANT
SUT VIC AB 3-0 SH 27 (SUTURE) ×2
SUT VIC AB 3-0 SH 27X BRD (SUTURE) ×2 IMPLANT
SUT VICRYL AB 2 0 TIES (SUTURE) IMPLANT
TAPE UMBILICAL 1/8 X36 TWILL (MISCELLANEOUS) IMPLANT
TOWEL OR 17X24 6PK STRL BLUE (TOWEL DISPOSABLE) ×4 IMPLANT
TOWEL OR 17X26 10 PK STRL BLUE (TOWEL DISPOSABLE) ×4 IMPLANT
TRAY FOLEY W/METER SILVER 14FR (SET/KITS/TRAYS/PACK) ×4 IMPLANT
TRAY LAPAROSCOPIC MC (CUSTOM PROCEDURE TRAY) ×4 IMPLANT
TROCAR XCEL BLUNT TIP 100MML (ENDOMECHANICALS) IMPLANT
TROCAR XCEL NON-BLD 11X100MML (ENDOMECHANICALS) IMPLANT
TROCAR XCEL NON-BLD 5MMX100MML (ENDOMECHANICALS) ×4 IMPLANT
TUBE FEEDING 8FR 16IN STR KANG (MISCELLANEOUS) ×4 IMPLANT
TUBE FEEDING ENTERAL 5FR 16IN (TUBING) IMPLANT
TUBING INSUFFLATION (TUBING) ×4 IMPLANT
TUNNELER SHEATH ON-Q 16GX12 DP (PAIN MANAGEMENT) IMPLANT
WATER STERILE IRR 1000ML POUR (IV SOLUTION) ×8 IMPLANT
YANKAUER SUCT BULB TIP NO VENT (SUCTIONS) ×4 IMPLANT

## 2017-01-12 NOTE — Transfer of Care (Signed)
Immediate Anesthesia Transfer of Care Note  Patient: Kaylee Mullins  Procedure(s) Performed: Procedure(s): LAPAROSCOPY DIAGNOSTIC (N/A) WHIPPLE PROCEDURE (N/A)  Patient Location: PACU  Anesthesia Type:General  Level of Consciousness: drowsy  Airway & Oxygen Therapy: Patient Spontanous Breathing and Patient connected to nasal cannula oxygen  Post-op Assessment: Report given to RN  Post vital signs: Reviewed and stable  Last Vitals:  Vitals:   01/12/17 0649  BP: (!) 173/69  Pulse: (!) 54  Resp: 20  Temp: 36.7 C    Last Pain:  Vitals:   01/12/17 0649  TempSrc: Oral  PainSc:       Patients Stated Pain Goal: 1 (85/88/50 2774)  Complications: No apparent anesthesia complications

## 2017-01-12 NOTE — Anesthesia Postprocedure Evaluation (Signed)
Anesthesia Post Note  Patient: Kaylee Mullins  Procedure(s) Performed: Procedure(s) (LRB): LAPAROSCOPY DIAGNOSTIC (N/A) WHIPPLE PROCEDURE (N/A)  Patient location during evaluation: PACU Anesthesia Type: General Level of consciousness: awake and alert Pain management: pain level controlled Vital Signs Assessment: post-procedure vital signs reviewed and stable Respiratory status: spontaneous breathing, nonlabored ventilation, respiratory function stable and patient connected to nasal cannula oxygen Cardiovascular status: blood pressure returned to baseline and stable Postop Assessment: no signs of nausea or vomiting Anesthetic complications: no       Last Vitals:  Vitals:   01/12/17 1350 01/12/17 1415  BP:    Pulse:    Resp:  15  Temp: 36.6 C     Last Pain:  Vitals:   01/12/17 1415  TempSrc:   PainSc: 0-No pain                 Leeroy Lovings DAVID

## 2017-01-12 NOTE — Progress Notes (Signed)
[place] Progress Note Patient Name: Kaylee Mullins DOB: 10/15/1935 MRN: [id number]   Date of Service  01/12/2017  HPI/Events of Note  Patient underwent resection of pancreaticoduodenectomy For ampullary carcinoma. Camera check shows patient resting comfortably with NG tube in place. Hemodynamically stable.   eICU Interventions  Continuing current plan of care as per surgery service.      Intervention Category Evaluation Type: New Patient Evaluation  Tera Partridge 01/12/2017, 5:48 PM

## 2017-01-12 NOTE — Anesthesia Procedure Notes (Signed)
Central Venous Catheter Insertion Performed by: Lillia Abed, anesthesiologist Start/End5/29/2018 9:00 AM, 01/12/2017 9:15 AM Patient location: Pre-op. Preanesthetic checklist: patient identified, IV checked, risks and benefits discussed, surgical consent, monitors and equipment checked, pre-op evaluation, timeout performed and anesthesia consent Lidocaine 1% used for infiltration and patient sedated Hand hygiene performed  and maximum sterile barriers used  Catheter size: 8 Fr Total catheter length 16. Central line was placed.Double lumen Procedure performed using ultrasound guided technique. Ultrasound Notes:anatomy identified, needle tip was noted to be adjacent to the nerve/plexus identified, no ultrasound evidence of intravascular and/or intraneural injection and image(s) printed for medical record Attempts: 1 Following insertion, dressing applied and line sutured. Post procedure assessment: blood return through all ports  Patient tolerated the procedure well with no immediate complications.

## 2017-01-12 NOTE — Progress Notes (Signed)
Wasted Fentanyl 38 mcg with Darcella Gasman, RN.     Sharene Skeans, RN

## 2017-01-12 NOTE — Anesthesia Preprocedure Evaluation (Addendum)
Anesthesia Evaluation  Patient identified by MRN, date of birth, ID band Patient awake    Reviewed: Allergy & Precautions, NPO status , Patient's Chart, lab work & pertinent test results, reviewed documented beta blocker date and time   Airway Mallampati: II  TM Distance: <3 FB Neck ROM: Full    Dental  (+) Upper Dentures, Lower Dentures, Dental Advisory Given   Pulmonary neg pulmonary ROS,    Pulmonary exam normal breath sounds clear to auscultation       Cardiovascular Exercise Tolerance: Good hypertension, Pt. on home beta blockers Normal cardiovascular exam Rhythm:Regular Rate:Bradycardia     Neuro/Psych Anxiety Depression CVA, Residual Symptoms negative psych ROS   GI/Hepatic Bowel prep,  Endo/Other  diabetes, Type 2, Insulin Dependent  Renal/GU Renal disease  negative genitourinary   Musculoskeletal   Abdominal (+) + obese,   Peds negative pediatric ROS (+)  Hematology   Anesthesia Other Findings   Reproductive/Obstetrics                            Anesthesia Physical Anesthesia Plan  ASA: III  Anesthesia Plan: General   Post-op Pain Management:    Induction: Intravenous  Airway Management Planned: Oral ETT  Additional Equipment:   Intra-op Plan: Utilization of Controlled Hypotension per surrgeon request  Post-operative Plan: Possible Post-op intubation/ventilation  Informed Consent: I have reviewed the patients History and Physical, chart, labs and discussed the procedure including the risks, benefits and alternatives for the proposed anesthesia with the patient or authorized representative who has indicated his/her understanding and acceptance.     Plan Discussed with: CRNA and Surgeon  Anesthesia Plan Comments:        Anesthesia Quick Evaluation

## 2017-01-12 NOTE — Progress Notes (Signed)
Upon initial assessment, pt's NG tube was at 35cm, verified by 2nd RN. Tube did not move from original placement by surgeon.  NG tube is securely taped and has not moved.  Prior measurement appears to have not been measured approprietly.

## 2017-01-12 NOTE — Progress Notes (Signed)
Pt. Checked blood sugar at 0400  And was 55. Pt. Drank 4 oz. Cranberry juice sugar came up 57. Pt. darank orange juice 2 oz. And sugar was 140.

## 2017-01-12 NOTE — Anesthesia Procedure Notes (Signed)
Procedure Name: Intubation Date/Time: 01/12/2017 7:59 AM Performed by: Orlie Dakin Pre-anesthesia Checklist: Patient identified, Emergency Drugs available, Suction available and Patient being monitored Patient Re-evaluated:Patient Re-evaluated prior to inductionOxygen Delivery Method: Circle system utilized Preoxygenation: Pre-oxygenation with 100% oxygen Intubation Type: IV induction Ventilation: Oral airway inserted - appropriate to patient size and Mask ventilation without difficulty Laryngoscope Size: Miller and 3 Grade View: Grade I Tube type: Oral Tube size: 7.0 mm Number of attempts: 1 Airway Equipment and Method: Stylet Placement Confirmation: positive ETCO2 and ETT inserted through vocal cords under direct vision Secured at: 21 cm Tube secured with: Tape Dental Injury: Teeth and Oropharynx as per pre-operative assessment

## 2017-01-12 NOTE — Interval H&P Note (Signed)
History and Physical Interval Note:  01/12/2017 7:36 AM  Kaylee Mullins  has presented today for surgery, with the diagnosis of ampullary carcinoma.  The various methods of treatment have been discussed with the patient and family. After consideration of risks, benefits and other options for treatment, the patient has consented to  Procedure(s): LAPAROSCOPY DIAGNOSTIC (N/A) WHIPPLE PROCEDURE (N/A) FEEDING TUBE PLACEMENT (N/A) as a surgical intervention .  The patient's history has been reviewed, patient examined, no change in status, stable for surgery.  I have reviewed the patient's chart and labs.  Questions were answered to the patient's satisfaction.     Drue Camera

## 2017-01-12 NOTE — H&P (Signed)
Kaylee Mullins Location: [facility] Patient #: [id number] DOB: 11-05-1935 Widowed / Language: Cleophus Molt / Race: Black or African American Female   History of Present Illness  The patient is a 81 year old female who presents with an abdominal mass. Pt is a 81 yo F who presented last fall with abnormal MRI and elevated LFTs. She had a renal cell cancer and dilated biliary tree seen on imaging along wtih cystic distal pancreatic mass. She was also noted to have a 50 pound weight loss. She had ERCP last fall with abnormal ampulla, but not really mass like. cytology and pathology were negative at the time. She got a sphincterotomy at that time. She had follow up ERCP due to stent. At this point, the ampulla was dramatically more abnormal and biopsy positive for cancer. She is not having significant abdominal pain. She is having bloating. She had CVA in 2016 wtih some left hand and foot weakness.   CT abd/pelvis 10/23/2016 IMPRESSION: 1. Interval biliary decompression with well-positioned metallic stent in the lower third of the common bile duct. 2. Known ampullary mass appears grossly stable, poorly delineated by routine CT. 3. Stable diffuse pancreatic parenchymal atrophy and prominent pancreatic ductal dilatation. Stable cystic pancreatic lesions in the posterior pancreatic head and pancreatic tail. 4. Solitary mildly enlarged porta hepatis lymph node, stable, indeterminate for metastasis. 5. Otherwise no evidence of metastatic disease in the abdomen or pelvis. 6. Stable appearance of the ablation site in the upper right kidney, characterized as benign post ablation change on recent 09/09/2016 MRI. 7. Additional findings include aortic atherosclerosis and mild left colonic diverticulosis.  Pathology Diagnosis Duodenum, Biopsy - ADENOCARCINOMA.  MRI abd 09/09/16 IMPRESSION: 1. Examination is positive for recurrent biliary and pancreatic duct dilatation. No stent is  in place at this time. Although no definitive masses identified in the cul lesion within the head of pancreas or ampulla cannot be excluded. Alternatively findings may reflect sequelae of chronic inflammation with subsequent ductal stricture. 2. Similar size of cystic lesion within the head of pancreas. 3. No complications status post cryo ablation of right upper pole renal neoplasm. No findings to suggest residual viable tumor at this time. 4. No upper abdominal adenopathy or suspicious enhancing liver lesions identified suggestive of metastases. 5. Common bile duct stone.  ERCP 10/16/2016 - The major papilla appeared to have a mass biopsied at the end of the procedure. - The biliary tree was swept and nothing was found. no pancreatic duct injection or wire advancement throughout the procedure - One covered metal stent was placed into the common bile duct for a very short stricture which was brushed prior to placing the stent.    Past Surgical History  Breast Biopsy  Left. Cataract Surgery  Bilateral. Colon Polyp Removal - Open  Hysterectomy (not due to cancer) - Partial  Nephrectomy  Right. Spinal Surgery - Lower Back  Thyroid Surgery  Tonsillectomy   Diagnostic Studies History Colonoscopy  1-5 years ago Mammogram  1-3 years ago Pap Smear  never  Allergies  Darvon *ANALGESICS - OPIOID*  NyQuil Multi-Symptom *COUGH/COLD/ALLERGY*  MetFORMIN HCl *ANTIDIABETICS*  Diarrhea. Allergies Reconciled   Medication History Ursodiol (250MG  Tablet, Oral) Active. Clopidogrel Bisulfate (75MG  Tablet, Oral) Active. Telmisartan-HCTZ (80-25MG  Tablet, Oral) Active. Metoprolol Succinate ER (100MG  Tablet ER 24HR, Oral) Active. Rosuvastatin Calcium (20MG  Tablet, Oral) Active. HumaLOG Mix 50/50 KwikPen ((50-50) 100UNIT/ML Susp Pen-inj, Subcutaneous) Active. Levothyroxine Sodium (200MCG Tablet, Oral) Active. CVS B-12 (1000MCG Tablet ER, Oral) Active. OneTouch Delica  Lancets  33G Active. Accu-Chek Aviva Plus (In Vitro) Active. Lasix (40MG  Tablet, Oral) Active. Medications Reconciled  Social History Alcohol use  Moderate alcohol use. Caffeine use  Carbonated beverages, Tea. No drug use  Tobacco use  Never smoker.  Family History Alcohol Abuse  Father. Anesthetic complications  Brother. Arthritis  Brother, Mother, Sister. Depression  Brother, Mother. Diabetes Mellitus  Brother, Sister. Heart Disease  Brother, Father, Mother. Heart disease in female family member before age 57  Heart disease in female family member before age 38  Hypertension  Brother, Daughter, Mother. Kidney Disease  Sister. Thyroid problems  Sister.  Pregnancy / Birth History Age at menarche  8 years. Age of menopause  61-50 Gravida  7 Irregular periods  Maternal age  57-20 Para  8  Other Problems Arthritis  Back Pain  Cerebrovascular Accident  Depression  Diabetes Mellitus  Hemorrhoids  High blood pressure  Thyroid Cancer  Thyroid Disease     Review of Systems  General Present- Appetite Loss, Night Sweats and Weight Loss. Not Present- Chills, Fatigue, Fever and Weight Gain. Skin Present- Change in Wart/Mole, Dryness, Hives, Jaundice and Rash. Not Present- New Lesions, Non-Healing Wounds and Ulcer. HEENT Present- Ringing in the Ears, Wears glasses/contact lenses and Yellow Eyes. Not Present- Earache, Hearing Loss, Hoarseness, Nose Bleed, Oral Ulcers, Seasonal Allergies, Sinus Pain, Sore Throat and Visual Disturbances. Respiratory Present- Snoring. Not Present- Bloody sputum, Chronic Cough, Difficulty Breathing and Wheezing. Breast Not Present- Breast Mass, Breast Pain, Nipple Discharge and Skin Changes. Cardiovascular Present- Leg Cramps and Swelling of Extremities. Not Present- Chest Pain, Difficulty Breathing Lying Down, Palpitations, Rapid Heart Rate and Shortness of Breath. Female Genitourinary Present- Frequency and  Nocturia. Not Present- Painful Urination, Pelvic Pain and Urgency. Musculoskeletal Present- Back Pain, Joint Pain, Joint Stiffness, Muscle Pain and Muscle Weakness. Not Present- Swelling of Extremities. Neurological Present- Trouble walking and Weakness. Not Present- Decreased Memory, Fainting, Headaches, Numbness, Seizures, Tingling and Tremor. Psychiatric Present- Change in Sleep Pattern, Depression and Fearful. Not Present- Anxiety, Bipolar and Frequent crying. Endocrine Present- Cold Intolerance, Excessive Hunger and Hair Changes. Not Present- Heat Intolerance, Hot flashes and New Diabetes. Hematology Present- Blood Thinners, Easy Bruising and Gland problems. Not Present- Excessive bleeding, HIV and Persistent Infections.  Vitals  Weight: 207.2 lb Height: 64in Body Surface Area: 1.99 m Body Mass Index: 35.57 kg/m  Temp.: 97.34F  Pulse: 63 (Regular)  BP: 190/80 (Sitting, Left Arm, Standard)       Physical Exam  General Mental Status-Alert. General Appearance-Consistent with stated age. Hydration-Well hydrated. Voice-Normal. Note: looks thin and a bit frail.   Head and Neck Head-normocephalic, atraumatic with no lesions or palpable masses. Trachea-midline. Thyroid Gland Characteristics - normal size and consistency.  Eye Eyeball - Bilateral-Extraocular movements intact. Sclera/Conjunctiva - Bilateral-No scleral icterus.  Chest and Lung Exam Chest and lung exam reveals -quiet, even and easy respiratory effort with no use of accessory muscles and on auscultation, normal breath sounds, no adventitious sounds and normal vocal resonance. Inspection Chest Wall - Normal. Back - normal.  Cardiovascular Cardiovascular examination reveals -normal heart sounds, regular rate and rhythm with no murmurs and normal pedal pulses bilaterally.  Abdomen Inspection Inspection of the abdomen reveals - No Hernias. Palpation/Percussion Palpation and  Percussion of the abdomen reveal - Soft, Non Tender, No Rebound tenderness, No Rigidity (guarding) and No hepatosplenomegaly. Auscultation Auscultation of the abdomen reveals - Bowel sounds normal.  Neurologic Neurologic evaluation reveals -alert and oriented x 3 with no impairment of recent or remote memory.  Mental Status-Normal.  Musculoskeletal Global Assessment -Note: no gross deformities.  Normal Exam - Left-Upper Extremity Strength Normal and Lower Extremity Strength Normal. Normal Exam - Right-Upper Extremity Strength Normal and Lower Extremity Strength Normal.  Lymphatic Head & Neck  General Head & Neck Lymphatics: Bilateral - Description - Normal. Axillary  General Axillary Region: Bilateral - Description - Normal. Tenderness - Non Tender. Femoral & Inguinal  Generalized Femoral & Inguinal Lymphatics: Bilateral - Description - No Generalized lymphadenopathy.    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 I had a long discussion wtih the patient and her family regarding the significance of surgery and the recovery. There are few chemotherapy options for ampullary carcinoma. She would need a whipple for complete resection. I will plan dx laparoscopy followed by whipple. I advised her that she needs to see oncology and nutrition at the cancer center in order to maximize her recovery and minimze risk of complications.  I will get a staging chest CT to look for metastatic disease.  I am also referring her for preoperative PT for deconditioning.  I advised her that she needs to have better nutrition and to be stronger in order to get through a whipple. I discussed the surgery with the patient including diagrams of anatomy. I discussed the potential for diagnostic laparoscopy. In the case of pancreatic cancer, if spread of the disease is found, we will abort the procedure and not proceed with resection. The  rationale for this was discussed with the patient. There has not been data to support resection of Stage IV disease in terms of survival benefit.  We discussed possible complications including: Potential of aborting procedure if tumor is invading the superior mesenteric or hepatic arteries Bleeding Infection and possible wound complications such as hernia Damage to adjacent structures Leak of anastamoses, primarily pancreatic Possible need for other procedures Possible prolonged nausea with possible need for external feeding. Possible prolonged hospital stay. Possible development of diabetes or worsening of current diabetes. Possible pancreatic exocrine insufficiency Prolonged fatigue/weakness/appetite Possible early recurrence of cancer   The patient understands and wishes to proceed. The patient has been advised to turn in disability paperwork to our office.  50 min spent in evaluation, examination, counseling, and coordination of care. >50% spent in counseling. Current Plans You are being scheduled for surgery- Our schedulers will call you.  You should hear from our office's scheduling department within 5 working days about the location, date, and time of surgery. We try to make accommodations for patient's preferences in scheduling surgery, but sometimes the OR schedule or the surgeon's schedule prevents [place] from making those accommodations.  If you have not heard from our office [phone]) in 5 working days, call the office and ask for your surgeon's nurse.  If you have other questions about your diagnosis, plan, or surgery, call the office and ask for your surgeon's nurse.  Pt Education - [facility] Colon Bowel Prep 2015 Miralax/Antibiotics Pt Education - flb whipple pt info CT CHEST W/ AND W/O CONTRAST (49449) (eval for metastatic disease, new ampullary cancer) Referred to Physical Therapy, for evaluation and follow up (Physical Therapy). Routine. Referred to Nutritional  Counseling, for evaluation and follow up (Dietician/Health Nutritionist). Routine.   Signed by Stark Klein, MD

## 2017-01-12 NOTE — Anesthesia Procedure Notes (Signed)
Epidural Patient location during procedure: OR Start time: 01/12/2017 1:15 PM End time: 01/12/2017 1:30 PM  Staffing Anesthesiologist: Lillia Abed Performed: anesthesiologist   Preanesthetic Checklist Completed: patient identified, surgical consent, pre-op evaluation, timeout performed, IV checked, risks and benefits discussed and monitors and equipment checked  Epidural Patient position: right lateral decubitus Prep: DuraPrep Patient monitoring: heart rate, cardiac monitor, continuous pulse ox and blood pressure Approach: midline Location: L2-L3 Injection technique: LOR air  Needle:  Needle type: Tuohy  Needle gauge: 17 G Needle length: 9 cm Needle insertion depth: 7 cm Catheter type: closed end flexible Catheter size: 19 Gauge Catheter at skin depth: 12 cm Test dose: negative and 2% lidocaine with Epi 1:200 K

## 2017-01-12 NOTE — Op Note (Signed)
PREOPERATIVE DIAGNOSIS: Ampullary carcinoma, cT1N1  POSTOPERATIVE DIAGNOSIS: Same.   PROCEDURES PERFORMED:  Diagnostic laparoscopy  Classic pancreaticoduodenectomy   Placement of pancreatic duct stent   SURGEON: Faera Byerly, MD   ASSISTANT: Ben Hoxworth, MD and Burke Thompson, MD   ANESTHESIA: General and epidural   FINDINGS: 2-3 cm ampullary mass. Firm pancreatic tissue. 10 mm common bile duct. 8 mm pancreatic duct  SPECIMENS:  1. Pancreaticoduodenectomy with gallbladder:  2. Hepatic artery lymph nodes  3.  Additional pancreatic tissue 4. omentum  ESTIMATED BLOOD LOSS: 400 mL.   COMPLICATIONS: None known.   PROCEDURE:   Pt was identified in the holding area and taken to  the operating room, and placed supine on the operating room  table. General anesthesia was induced. The patient's abdomen was  prepped and draped in a sterile fashion, after a Foley catheter was  placed. A time-out was performed according to the surgical safety check  list. When all was correct we continued.   The patient was placed in reverse trendelenburg position and rotated to the right.  The left subcostal margin was anesthetized with local anesthesia.  A 5 mm optiview trocar was placed under direct visualization.  The abdomen was insufflated with carbon dioxide.  The abdomen was examined. There were some adhesions from prior surgery, but I was able to visualize the anterior surface of the liver without issue.  No evidence of carcinomatosis was seen.    A midline incision was made from the xiphoid to just above the umbilicus.  The subcutaneous tissues were divided with the Bovie cautery. The peritoneum was entered in the center of the abdomen. Digital retraction was then used to elevate the preperitoneal fat, and  this was taken with the cautery as well. The subcutaneous tissues and  fascia of the muscular layers were taken laterally with the cautery.  Care was taken to protect the underlying viscera.  The adhesions were taken down sharply and with cautery where appropriate.  The omentum was stuck down in the pelvis, and this had to be released.   A Bookwalter self-retaining retractor was placed  for visualization. The right colon was freed up from the retroperitoneum at the hepatic flexure. The porta was identified. The  duodenum was kocherized extensively with blunt dissection and with cautery. The gallbladder was taken off the liver with a combination of blunt dissection and cautery. The cystic duct was clipped with the Hemalock clips. The cystic duct was divided and the gallbladder was passed off.   The common bile duct was skeletonized near the duodenum. A vessel loop was passed around it. The gastroduodenal artery, as well as the common hepatic artery were skeletonized. The proper hepatic artery was traced out to make sure that flow was going to both sides of the liver when the GDA was clamped. The GDA was test clamped with the bulldog, with good flow to the liver and  no signs of ischemia. This was divided with 2-0 silk ties and then clipped. The proper hepatic artery was reflected upward, and the anterior portal vein was exposed.  A Kelly clamp was passed underneath the pancreas at the superior mesenteric vein, and this passed  easily with no signs of tumor involvement.   Attention was then directed to the stomach, and the omentum was taken  off of the stomach at the border of the antrum and the body. The  gastrohepatic ligament was taken down with the harmonic, and care was  taken to make sure there was   not a replaced left hepatic artery in this  location. The stomach was divided with the GIA-75 stapler. The border  of the stomach was oversewn with a 3-0 running PDS suture.   Attention was then directed to the small bowel. Around 10 cm past the  ligament of Treitz the bowel was divided with the 75-GIA.  The distal portion of the jejunum was also oversewn with a 3-0 PDS  suture. The  fourth portion of the duodenum was skeletonized with the  harmonic scalpel, taking down all of the mesenteric vessels. The  ligament of Treitz was taken down. The IMV was preserved.  The duodenum was then passed underneath the portal vein.   The superior portion of the pancreas was cleaned off some more, and an additional arterial branch was skeletonized and suture ligated.  The kelly was passed under the pancreas easily without signs of vascular involvement.  The pancreas was divided with the TA 30 and the knife. 2-0 silk sutures were tied down and the inferior and superior border of the pancreas. The Bovie was used to coagulate the small bleeders at the border of the pancreas.  The Overholt in combination with the harmonic and locking Weck clips  were then used to take the uncinate process off of the portal vein and  the superior mesenteric artery. Care was taken not to incorporate the  superior mesenteric artery in the dissection. The specimen was then marked with the margin marker paint kit and passed off the table for frozen section of the pancreas and biliary margin.   The jejunum was then passed underneath  the SMV in order to get appropriate lie for the pancreatic and biliary  anastomoses. The more distal portion of the jejunum was pulled up over  the colon, and two 3-0 silks were placed through the posterior border  of the stomach for the gastrojejunostomy. The stomach and the small  bowel were opened, and a GIA-75 was used to create an end-to-end  anastomosis. The open areas of the staple line were examined to ensure  that there was hemostasis. The defect was then closed with a single  layer of running Connell suture of 3-0 PDS. Prior to a complete  closure, the NG tube was passed toward the afferent limb.   The appropriate location for the choledochojejunostomy was identified, and  the small bowel was opened approximately 10 mm. The anastamosis was created with approximately twelve  4-0 interrupted PDS sutures.   The 2 corner sutures were placed first  and then the posterior layer was done in an interrupted fashion tying on  the inside. The superior layer was then closed with interrupted sutures as  well.   At this point the frozens returned back as negative for malignancy, but concern for atypia. Additional pancreatic tissue was taken.  The pancreatic  anastomosis was then created with a posterior layer of 2-0 silks.  The small bowel was opened around 8 mm.  The pancreas was firm, and the duct was very large, at around 8 mm. An 8 Fr pediatric feeding tube was used as a pancreatic stent. A duct to mucosa anastamosis was created with seven interrupted 5-0 PDS sutures.  The anterior layer was then oversewn with 2-0 silks to dunk the pancreatic parenchyma.   The upper abdomen was then irrigated and then those anastomoses were covered  with Evicel. This was allowed to dry.  The abdomen was then irrigated again with water and all the laparotomy sponges were removed. A   lap count was performed, which was correct. Two 19-Blake drains were placed, with the  lateral-most drain placed behind the choledochojejunostomy. The medial  Blake drain was placed just anterior and slightly superior to the  Pancreaticojejunostomy.  The fascia was then closed with #1 looped running PDS sutures. The skin was irrigated and then closed with  staples. The wounds were cleaned, dried and dressed with a sterile  dressing.   The patient tolerated the procedure well and was extubated and taken to  PACU in stable condition. Needle and sponge counts were correct x2.    

## 2017-01-13 ENCOUNTER — Inpatient Hospital Stay (HOSPITAL_COMMUNITY): Payer: Medicare Other | Admitting: Anesthesiology

## 2017-01-13 ENCOUNTER — Encounter (HOSPITAL_COMMUNITY): Payer: Self-pay | Admitting: General Surgery

## 2017-01-13 LAB — COMPREHENSIVE METABOLIC PANEL
ALT: 18 U/L (ref 14–54)
AST: 37 U/L (ref 15–41)
Albumin: 2.3 g/dL — ABNORMAL LOW (ref 3.5–5.0)
Alkaline Phosphatase: 45 U/L (ref 38–126)
Anion gap: 8 (ref 5–15)
BUN: 17 mg/dL (ref 6–20)
CO2: 23 mmol/L (ref 22–32)
Calcium: 7.6 mg/dL — ABNORMAL LOW (ref 8.9–10.3)
Chloride: 105 mmol/L (ref 101–111)
Creatinine, Ser: 1.43 mg/dL — ABNORMAL HIGH (ref 0.44–1.00)
GFR calc Af Amer: 40 mL/min — ABNORMAL LOW (ref >60)
GFR calc non Af Amer: 34 mL/min — ABNORMAL LOW (ref >60)
Glucose, Bld: 313 mg/dL — ABNORMAL HIGH (ref 65–99)
Potassium: 3.8 mmol/L (ref 3.5–5.1)
Sodium: 136 mmol/L (ref 135–145)
Total Bilirubin: 0.5 mg/dL (ref 0.3–1.2)
Total Protein: 4.6 g/dL — ABNORMAL LOW (ref 6.5–8.1)

## 2017-01-13 LAB — GLUCOSE, CAPILLARY
Glucose-Capillary: 248 mg/dL — ABNORMAL HIGH (ref 65–99)
Glucose-Capillary: 252 mg/dL — ABNORMAL HIGH (ref 65–99)
Glucose-Capillary: 255 mg/dL — ABNORMAL HIGH (ref 65–99)
Glucose-Capillary: 275 mg/dL — ABNORMAL HIGH (ref 65–99)
Glucose-Capillary: 284 mg/dL — ABNORMAL HIGH (ref 65–99)
Glucose-Capillary: 328 mg/dL — ABNORMAL HIGH (ref 65–99)
Glucose-Capillary: 330 mg/dL — ABNORMAL HIGH (ref 65–99)

## 2017-01-13 LAB — CBC
HCT: 23.7 % — ABNORMAL LOW (ref 36.0–46.0)
Hemoglobin: 7.8 g/dL — ABNORMAL LOW (ref 12.0–15.0)
MCH: 31.7 pg (ref 26.0–34.0)
MCHC: 32.9 g/dL (ref 30.0–36.0)
MCV: 96.3 fL (ref 78.0–100.0)
Platelets: 174 10*3/uL (ref 150–400)
RBC: 2.46 MIL/uL — ABNORMAL LOW (ref 3.87–5.11)
RDW: 13.1 % (ref 11.5–15.5)
WBC: 11 10*3/uL — ABNORMAL HIGH (ref 4.0–10.5)

## 2017-01-13 LAB — PROTIME-INR
INR: 1.55
Prothrombin Time: 18.7 seconds — ABNORMAL HIGH (ref 11.4–15.2)

## 2017-01-13 LAB — MRSA PCR SCREENING: MRSA by PCR: NEGATIVE

## 2017-01-13 LAB — PHOSPHORUS: Phosphorus: 2.2 mg/dL — ABNORMAL LOW (ref 2.5–4.6)

## 2017-01-13 LAB — MAGNESIUM: Magnesium: 1.4 mg/dL — ABNORMAL LOW (ref 1.7–2.4)

## 2017-01-13 MED ORDER — INSULIN GLARGINE 100 UNIT/ML ~~LOC~~ SOLN
30.0000 [IU] | Freq: Every morning | SUBCUTANEOUS | Status: DC
  Administered 2017-01-13 – 2017-01-22 (×10): 30 [IU] via SUBCUTANEOUS
  Filled 2017-01-13 (×13): qty 0.3

## 2017-01-13 MED ORDER — SODIUM PHOSPHATES 45 MMOLE/15ML IV SOLN
30.0000 mmol | Freq: Once | INTRAVENOUS | Status: AC
  Administered 2017-01-13: 30 mmol via INTRAVENOUS
  Filled 2017-01-13: qty 10

## 2017-01-13 MED ORDER — ALBUMIN HUMAN 25 % IV SOLN
25.0000 g | Freq: Four times a day (QID) | INTRAVENOUS | Status: AC
  Administered 2017-01-13 – 2017-01-15 (×8): 25 g via INTRAVENOUS
  Filled 2017-01-13 (×3): qty 100
  Filled 2017-01-13 (×2): qty 50
  Filled 2017-01-13 (×2): qty 100
  Filled 2017-01-13: qty 50
  Filled 2017-01-13: qty 100

## 2017-01-13 MED ORDER — INSULIN GLARGINE 100 UNIT/ML ~~LOC~~ SOLN: 8.0000 [IU] | Freq: Every day | SUBCUTANEOUS | Status: DC

## 2017-01-13 MED ORDER — MAGNESIUM SULFATE 4 GM/100ML IV SOLN
4.0000 g | Freq: Once | INTRAVENOUS | Status: AC
  Administered 2017-01-13: 4 g via INTRAVENOUS
  Filled 2017-01-13: qty 100

## 2017-01-13 NOTE — Progress Notes (Addendum)
Inpatient Diabetes Program Recommendations  AACE/ADA: New Consensus Statement on Inpatient Glycemic Control (2015)  Target Ranges:  Prepandial:   less than 140 mg/dL      Peak postprandial:   less than 180 mg/dL (1-2 hours)      Critically ill patients:  140 - 180 mg/dL   Lab Results  Component Value Date   GLUCAP 252 (H) 01/13/2017   HGBA1C 9.9 (H) 01/04/2017    Review of Glycemic Control Results for Kaylee Mullins, Kaylee Mullins (MRN [id number]) as of 01/13/2017 12:34  Ref. Range 01/12/2017 16:07 01/12/2017 19:17 01/13/2017 00:00 01/13/2017 04:19 01/13/2017 07:55  Glucose-Capillary Latest Ref Range: 65 - 99 mg/dL 281 (H) 310 (H) 328 (H) 330 (H) 252 (H)   Diabetes history: DM Outpatient Diabetes medications: Humalog 50/50 Insulin- 90 units AM/ 60 units PM Current orders for Inpatient glycemic control: Lantus 8 units + Novolog correction 0-15 units q 4 hrs.  Inpatient Diabetes Program Recommendations:  Patient has received 60 units of Novolog correction over the past 24 hrs. Please consider increase in Lantus to 30 units and administer now rather than hs. Will follow. 12:57 Spoke with Dr. [name] and received order for Lantus 30 units now and every 24 hrs.  Thank you, Nani Gasser. Cerys Winget, RN, MSN, CDE  Diabetes Coordinator Inpatient Glycemic Control Team Team Pager [phone] (8am-5pm) 01/13/2017 12:37 PM

## 2017-01-13 NOTE — Progress Notes (Signed)
1 Day Post-Op   Subjective/Chief Complaint: A bit sore, but getting better with fentanyl.     Objective: Vital signs in last 24 hours: Temp:  [97.6 F (36.4 C)-99 F (37.2 C)] 98.7 F (37.1 C) (05/30 0800) Pulse Rate:  [41-75] 72 (05/30 0800) Resp:  [13-23] 18 (05/30 0817) BP: (65-151)/(36-104) 131/65 (05/30 0800) SpO2:  [93 %-100 %] 99 % (05/30 0817) Arterial Line BP: (94-183)/(31-90) 183/41 (05/30 0800) Weight:  [97.7 kg (215 lb 6.2 oz)] 97.7 kg (215 lb 6.2 oz) (05/29 1553) Last BM Date:  (PTA)  Intake/Output from previous day: 05/29 0701 - 05/30 0700 In: 6708.3 [I.V.:5858.3; IV Piggyback:750] Out: 1910 [Urine:870; Emesis/NG output:300; Drains:390; Blood:350] Intake/Output this shift: Total I/O In: -  Out: 75 [Urine:75]  General appearance: alert, cooperative and mild distress Resp: breathing comfortably GI: soft, mildly distended, approp tender. Extremities: extremities normal, atraumatic, no cyanosis or edema  Lab Results:   Recent Labs  01/12/17 0948 01/13/17 0423  WBC  --  11.0*  HGB 8.8* 7.8*  HCT 26.0* 23.7*  PLT  --  174   BMET  Recent Labs  01/12/17 0948 01/13/17 0423  NA 141 136  K 3.0* 3.8  CL  --  105  CO2  --  23  GLUCOSE  --  313*  BUN  --  17  CREATININE  --  1.43*  CALCIUM  --  7.6*   PT/INR  Recent Labs  01/13/17 0423  LABPROT 18.7*  INR 1.55   ABG  Recent Labs  01/12/17 0948  PHART 7.430  HCO3 24.9    Studies/Results: Dg Chest 1 View  Result Date: 01/12/2017 CLINICAL DATA:  Encounter for central line placement EXAM: CHEST 1 VIEW COMPARISON:  11/25/2016 chest CT FINDINGS: Right IJ central line with tip at the SVC level. No evidence of pneumothorax. A nasogastric tube reaches the stomach at least. Mild retrocardiac atelectasis. No pulmonary edema or pleural effusion. IMPRESSION: 1. Central line with tip at the [facility].  No pneumothorax. 2. Cardiomegaly and mild atelectasis. Electronically Signed   By: Monte Fantasia M.D.    On: 01/12/2017 14:42    Anti-infectives: Anti-infectives    Start     Dose/Rate Route Frequency Ordered Stop   01/12/17 1530  ceFAZolin (ANCEF) IVPB 2g/100 mL premix     2 g 200 mL/hr over 30 Minutes Intravenous Every 8 hours 01/12/17 1521 01/12/17 2312   01/12/17 0736  ceFAZolin (ANCEF) 2-4 GM/100ML-% IVPB    Comments:  [name]   : cabinet override      01/12/17 0736 01/12/17 0801   01/12/17 0622  ceFAZolin (ANCEF) IVPB 2g/100 mL premix     2 g 200 mL/hr over 30 Minutes Intravenous On call to O.R. 01/12/17 0622 01/12/17 1158      Assessment/Plan: s/p Procedure(s): LAPAROSCOPY DIAGNOSTIC (N/A) WHIPPLE PROCEDURE (N/A) Continue foley due to urinary output monitoring and epidural in place hold lovenox til epidural out.    Add lantus for hyperglycemia Mild acute kidney injury.  Add albumin boluses. Continue epidural.   Keep in ICU given bump in creatinine and BP.  LOS: 1 day    [facility] 01/13/2017

## 2017-01-13 NOTE — Progress Notes (Signed)
Pt removed NGT more than half way.  Dr. [name] notified and instructed me to reinsert NGT slowly and obtain KUB if resistance is met.

## 2017-01-13 NOTE — Anesthesia Post-op Follow-up Note (Signed)
  Anesthesia Pain Follow-up Note  Patient: Kaylee Mullins  Day #: 1  Date of Follow-up: 01/13/2017 Time: 12:21 PM  Last Vitals:  Vitals:   01/13/17 0800 01/13/17 0817  BP: 131/65   Pulse: 72   Resp: 18 18  Temp: 37.1 C     Level of Consciousness: alert  Pain: moderate.  Pt describes pain in all of her abdominal area.    Side Effects:None  Catheter Site Exam:clean, dry, no drainage  Epidural / Intrathecal    Start     Dose/Rate Route Frequency Ordered Stop   01/12/17 1715  ropivacaine (PF) 2 mg/mL (0.2%) (NAROPIN) injection     10 mL/hr 10 mL/hr  Epidural Continuous 01/12/17 1701         Plan: Continue current therapy of postop epidural at surgeon's request Due to the patient's ongoing discomfort, the epidural infusion rate was increased from 10 mL/h to 14 mL/h.  Bolus dose was also injected into epidural (incremental doses of 0.25% bupivacaine (65mL total)).  Minimal improvement in pain was observed over the following hour after bolus dose.  Plan to initiate Fentanyl PCA to augment analgesia. [place]

## 2017-01-14 LAB — GLUCOSE, CAPILLARY
Glucose-Capillary: 191 mg/dL — ABNORMAL HIGH (ref 65–99)
Glucose-Capillary: 198 mg/dL — ABNORMAL HIGH (ref 65–99)
Glucose-Capillary: 201 mg/dL — ABNORMAL HIGH (ref 65–99)
Glucose-Capillary: 223 mg/dL — ABNORMAL HIGH (ref 65–99)
Glucose-Capillary: 237 mg/dL — ABNORMAL HIGH (ref 65–99)
Glucose-Capillary: 278 mg/dL — ABNORMAL HIGH (ref 65–99)

## 2017-01-14 LAB — COMPREHENSIVE METABOLIC PANEL
ALT: 14 U/L (ref 14–54)
AST: 30 U/L (ref 15–41)
Albumin: 3.5 g/dL (ref 3.5–5.0)
Alkaline Phosphatase: 70 U/L (ref 38–126)
Anion gap: 8 (ref 5–15)
BUN: 12 mg/dL (ref 6–20)
CO2: 24 mmol/L (ref 22–32)
Calcium: 8 mg/dL — ABNORMAL LOW (ref 8.9–10.3)
Chloride: 105 mmol/L (ref 101–111)
Creatinine, Ser: 1 mg/dL (ref 0.44–1.00)
GFR calc Af Amer: >60 mL/min (ref >60)
GFR calc non Af Amer: 53 mL/min — ABNORMAL LOW (ref >60)
Glucose, Bld: 264 mg/dL — ABNORMAL HIGH (ref 65–99)
Potassium: 3.8 mmol/L (ref 3.5–5.1)
Sodium: 137 mmol/L (ref 135–145)
Total Bilirubin: 1 mg/dL (ref 0.3–1.2)
Total Protein: 6 g/dL — ABNORMAL LOW (ref 6.5–8.1)

## 2017-01-14 LAB — POCT I-STAT 7, (LYTES, BLD GAS, ICA,H+H)
Acid-base deficit: 1 mmol/L (ref 0.0–2.0)
Bicarbonate: 25.5 mmol/L (ref 20.0–28.0)
Calcium, Ion: 1.15 mmol/L (ref 1.15–1.40)
HCT: 26 % — ABNORMAL LOW (ref 36.0–46.0)
Hemoglobin: 8.8 g/dL — ABNORMAL LOW (ref 12.0–15.0)
O2 Saturation: 100 %
Patient temperature: 35.3
Potassium: 3.7 mmol/L (ref 3.5–5.1)
Sodium: 140 mmol/L (ref 135–145)
TCO2: 27 mmol/L (ref 0–100)
pCO2 arterial: 45.5 mmHg (ref 32.0–48.0)
pH, Arterial: 7.349 — ABNORMAL LOW (ref 7.350–7.450)
pO2, Arterial: 285 mmHg — ABNORMAL HIGH (ref 83.0–108.0)

## 2017-01-14 LAB — CBC
HCT: 22.4 % — ABNORMAL LOW (ref 36.0–46.0)
Hemoglobin: 7.2 g/dL — ABNORMAL LOW (ref 12.0–15.0)
MCH: 31.2 pg (ref 26.0–34.0)
MCHC: 32.1 g/dL (ref 30.0–36.0)
MCV: 97 fL (ref 78.0–100.0)
Platelets: 164 10*3/uL (ref 150–400)
RBC: 2.31 MIL/uL — ABNORMAL LOW (ref 3.87–5.11)
RDW: 13.5 % (ref 11.5–15.5)
WBC: 12.2 10*3/uL — ABNORMAL HIGH (ref 4.0–10.5)

## 2017-01-14 LAB — MAGNESIUM: Magnesium: 2.4 mg/dL (ref 1.7–2.4)

## 2017-01-14 LAB — PHOSPHORUS: Phosphorus: 2.5 mg/dL (ref 2.5–4.6)

## 2017-01-14 MED ORDER — ROPIVACAINE HCL 2 MG/ML IJ SOLN
15.0000 mL/h | INTRAMUSCULAR | Status: DC
  Administered 2017-01-14 – 2017-01-17 (×5): 14 mL/h via EPIDURAL
  Administered 2017-01-17 – 2017-01-18 (×2): 15 mL/h via EPIDURAL
  Filled 2017-01-14 (×20): qty 200

## 2017-01-14 MED ORDER — SODIUM CHLORIDE 0.9% FLUSH: 9.0000 mL | INTRAVENOUS | Status: DC | PRN

## 2017-01-14 MED ORDER — ALBUTEROL SULFATE (2.5 MG/3ML) 0.083% IN NEBU
2.5000 mg | INHALATION_SOLUTION | RESPIRATORY_TRACT | Status: DC | PRN
  Administered 2017-01-15: 2.5 mg via RESPIRATORY_TRACT
  Filled 2017-01-14: qty 3

## 2017-01-14 MED ORDER — NALOXONE HCL 0.4 MG/ML IJ SOLN: 0.4000 mg | INTRAMUSCULAR | Status: DC | PRN

## 2017-01-14 MED ORDER — FUROSEMIDE 10 MG/ML IJ SOLN
40.0000 mg | Freq: Once | INTRAMUSCULAR | Status: AC
Start: 2017-01-14 — End: 2017-01-14
  Administered 2017-01-14: 40 mg via INTRAVENOUS

## 2017-01-14 MED ORDER — FENTANYL 40 MCG/ML IV SOLN
INTRAVENOUS | Status: DC
  Administered 2017-01-14: 1000 ug via INTRAVENOUS
  Administered 2017-01-14: 30 ug via INTRAVENOUS
  Administered 2017-01-15: 20 ug via INTRAVENOUS
  Administered 2017-01-15: 30 ug via INTRAVENOUS
  Administered 2017-01-15: 50 ug via INTRAVENOUS
  Administered 2017-01-15: 10 ug via INTRAVENOUS
  Administered 2017-01-15: 20 ug via INTRAVENOUS
  Administered 2017-01-15: 0 ug via INTRAVENOUS
  Administered 2017-01-15: 30 ug via INTRAVENOUS
  Administered 2017-01-16: 60 ug via INTRAVENOUS
  Administered 2017-01-16: 30 ug via INTRAVENOUS
  Administered 2017-01-16: 20 ug via INTRAVENOUS
  Administered 2017-01-16: 260 ug via INTRAVENOUS
  Administered 2017-01-16: 100 ug via INTRAVENOUS
  Administered 2017-01-17: 70 ug via INTRAVENOUS
  Administered 2017-01-17: 60 ug via INTRAVENOUS
  Administered 2017-01-17: 70 ug via INTRAVENOUS
  Administered 2017-01-17: 10 ug via INTRAVENOUS
  Administered 2017-01-17: 130 ug via INTRAVENOUS
  Administered 2017-01-17: 1000 ug via INTRAVENOUS
  Administered 2017-01-17: 130 ug via INTRAVENOUS
  Administered 2017-01-18: 50 ug via INTRAVENOUS
  Administered 2017-01-18: 40 ug via INTRAVENOUS
  Administered 2017-01-18: 50 ug via INTRAVENOUS
  Administered 2017-01-18: 70 ug via INTRAVENOUS
  Administered 2017-01-18: 140 ug via INTRAVENOUS
  Administered 2017-01-18: 90 ug via INTRAVENOUS
  Administered 2017-01-18: 30 ug via INTRAVENOUS
  Administered 2017-01-19: 140 ug via INTRAVENOUS
  Administered 2017-01-19: 1000 ug via INTRAVENOUS
  Administered 2017-01-19: 78 ug via INTRAVENOUS
  Administered 2017-01-19: 60 ug via INTRAVENOUS
  Administered 2017-01-19: 120 ug via INTRAVENOUS
  Administered 2017-01-20: 130 ug via INTRAVENOUS
  Administered 2017-01-20: 120 ug via INTRAVENOUS
  Administered 2017-01-20: 170 ug via INTRAVENOUS
  Administered 2017-01-20: 40 ug via INTRAVENOUS
  Administered 2017-01-21: 20 ug via INTRAVENOUS
  Administered 2017-01-21: 08:00:00 via INTRAVENOUS
  Administered 2017-01-21: 0 ug via INTRAVENOUS
  Administered 2017-01-22 (×2): 10 ug via INTRAVENOUS
  Administered 2017-01-22: 40 ug via INTRAVENOUS
  Administered 2017-01-22: 10 ug via INTRAVENOUS
  Administered 2017-01-22: 20 ug via INTRAVENOUS
  Administered 2017-01-22 – 2017-01-23 (×2): 10 ug via INTRAVENOUS
  Administered 2017-01-23: 0 ug via INTRAVENOUS
  Administered 2017-01-23: 10 ug via INTRAVENOUS
  Administered 2017-01-23: 40 ug via INTRAVENOUS
  Administered 2017-01-23 (×2): 0 ug via INTRAVENOUS
  Administered 2017-01-24: 80 ug via INTRAVENOUS
  Administered 2017-01-24: 40 ug via INTRAVENOUS
  Administered 2017-01-24: 60 ug via INTRAVENOUS
  Administered 2017-01-24: 10 ug via INTRAVENOUS
  Administered 2017-01-24: 110 ug via INTRAVENOUS
  Administered 2017-01-24: 40 ug via INTRAVENOUS
  Administered 2017-01-25: 60 ug via INTRAVENOUS
  Administered 2017-01-25: 10 ug via INTRAVENOUS
  Administered 2017-01-25: 20 ug via INTRAVENOUS
  Administered 2017-01-25: 30 ug via INTRAVENOUS
  Administered 2017-01-25: 100 ug via INTRAVENOUS
  Administered 2017-01-25: 30 ug via INTRAVENOUS
  Administered 2017-01-26: 10 ug via INTRAVENOUS
  Administered 2017-01-26: via INTRAVENOUS
  Administered 2017-01-26: 20 ug via INTRAVENOUS
  Administered 2017-01-26 – 2017-01-27 (×3): 30 ug via INTRAVENOUS
  Administered 2017-01-27: 40 ug via INTRAVENOUS
  Administered 2017-01-28: 20 ug via INTRAVENOUS
  Administered 2017-01-29 (×2): 30 ug via INTRAVENOUS
  Filled 2017-01-14 (×7): qty 25

## 2017-01-14 MED ORDER — SODIUM CHLORIDE 0.9 % IV SOLN
0.0000 ug/min | INTRAVENOUS | Status: DC
  Filled 2017-01-14: qty 1

## 2017-01-14 MED ORDER — "THROMBI-PAD 3""X3"" EX PADS"
1.0000 | MEDICATED_PAD | Freq: Once | CUTANEOUS | Status: AC
  Administered 2017-01-14: 1 via TOPICAL
  Filled 2017-01-14: qty 1

## 2017-01-14 MED ORDER — ALBUTEROL SULFATE (2.5 MG/3ML) 0.083% IN NEBU
INHALATION_SOLUTION | RESPIRATORY_TRACT | Status: AC
  Administered 2017-01-14: 2.5 mg
  Filled 2017-01-14: qty 3

## 2017-01-14 NOTE — Progress Notes (Signed)
01/14/17 1100  What Happened  Was fall witnessed? Yes  Who witnessed fall? San Jetty RN and Warden Fillers RN  Patients activity before fall bathroom-assisted (chair to bedside commode with assist)  Point of contact buttocks  Was patient injured? No  Follow Up  MD notified Dr. Duane Boston  Time MD notified [phone]  Family notified Yes-comment  Time family notified 1130  Additional tests No  Simple treatment (none)  Progress note created (see row info) Yes  Adult Fall Risk Assessment  Risk Factor Category (scoring not indicated) History of more than one fall within 6 months before admission (document High fall risk)  Patient's Fall Risk High Fall Risk (>13 points)  Adult Fall Risk Interventions  Required Bundle Interventions *See Row Information* High fall risk - low, moderate, and high requirements implemented  Additional Interventions Use of appropriate toileting equipment (bedpan, BSC, etc.)  Screening for Fall Injury Risk  Risk For Fall Injury- See Row Information  None identified  Vitals  BP (!) 116/96  MAP (mmHg) 104  BP Location Left Arm  BP Method Automatic  Patient Position (if appropriate) Lying  Pulse Rate 73  Pulse Rate Source Monitor  ECG Heart Rate 73  Cardiac Rhythm NSR  Resp 17  Oxygen Therapy  SpO2 99 %  O2 Device Room Air  Pain Assessment  Pain Assessment No/denies pain (Verbalized epidural now working)  Pain Score 0  Neurological  Neuro (WDL) X  Level of Consciousness Alert  Orientation Level Oriented X4  Cognition Appropriate at baseline;Follows commands  Speech Clear;Nods/gestures appropriately  Pupil Assessment  Yes  R Pupil Size (mm) 3  R Pupil Shape Round  R Pupil Reaction Brisk  L Pupil Size (mm) 3  L Pupil Shape Round  L Pupil Reaction Brisk  Additional Pupil Assessments No  Motor Function/Sensation Assessment Grip  R Hand Grip Present;Moderate  L Hand Grip Present;Moderate   Neuro Symptoms None  Neuro Additional Assessments No   Glasgow Coma Scale  Eye Opening 4  Best Verbal Response (NON-intubated) 5  Best Motor Response 6  Glasgow Coma Scale Score 15  Musculoskeletal  Musculoskeletal (WDL) X  Assistive Device MaxiSlide;BSC  Generalized Weakness Yes (LLE weakness from old cva)  Weight Bearing Restrictions No  Integumentary  Integumentary (WDL) X  Skin Color Appropriate for ethnicity  Skin Condition Dry  Skin Integrity Surgical Incision (see LDA)  Skin Turgor Non-tenting

## 2017-01-14 NOTE — Progress Notes (Signed)
2 Days Post-Op   Subjective/Chief Complaint: Very sore today.  No n/v.  Cr back down.     Objective: Vital signs in last 24 hours: Temp:  [96.9 F (36.1 C)-99 F (37.2 C)] 99 F (37.2 C) (05/31 0352) Pulse Rate:  [63-117] 75 (05/31 0700) Resp:  [15-34] 19 (05/31 0700) BP: (106-182)/(48-75) 173/72 (05/31 0700) SpO2:  [95 %-100 %] 98 % (05/31 0700) Arterial Line BP: (131-185)/(35-44) 161/40 (05/30 1200) Weight:  [98.1 kg (216 lb 4.3 oz)] 98.1 kg (216 lb 4.3 oz) (05/31 0600) Last BM Date: 01/11/17  Intake/Output from previous day: 05/30 0701 - 05/31 0700 In: 2750.2 [I.V.:2300; NG/GT:120; IV Piggyback:200] Out: 2655 [Urine:1735; Emesis/NG output:750; Drains:170] Intake/Output this shift: No intake/output data recorded.  General appearance: alert, cooperative and mild distress Resp: breathing comfortably GI: soft, mildly distended, approp tender. Extremities: extremities normal, atraumatic, no cyanosis or edema Drains serosang.  Lab Results:   Recent Labs  01/13/17 0423 01/14/17 0408  WBC 11.0* 12.2*  HGB 7.8* 7.2*  HCT 23.7* 22.4*  PLT 174 164   BMET  Recent Labs  01/13/17 0423 01/14/17 0408  NA 136 137  K 3.8 3.8  CL 105 105  CO2 23 24  GLUCOSE 313* 264*  BUN 17 12  CREATININE 1.43* 1.00  CALCIUM 7.6* 8.0*   PT/INR  Recent Labs  01/13/17 0423  LABPROT 18.7*  INR 1.55   ABG  Recent Labs  01/12/17 0948 01/12/17 1300  PHART 7.430 7.349*  HCO3 24.9 25.5    Studies/Results: Dg Chest 1 View  Result Date: 01/12/2017 CLINICAL DATA:  Encounter for central line placement EXAM: CHEST 1 VIEW COMPARISON:  11/25/2016 chest CT FINDINGS: Right IJ central line with tip at the SVC level. No evidence of pneumothorax. A nasogastric tube reaches the stomach at least. Mild retrocardiac atelectasis. No pulmonary edema or pleural effusion. IMPRESSION: 1. Central line with tip at the [facility].  No pneumothorax. 2. Cardiomegaly and mild atelectasis. Electronically Signed    By: Monte Fantasia M.D.   On: 01/12/2017 14:42    Anti-infectives: Anti-infectives    Start     Dose/Rate Route Frequency Ordered Stop   01/12/17 1530  ceFAZolin (ANCEF) IVPB 2g/100 mL premix     2 g 200 mL/hr over 30 Minutes Intravenous Every 8 hours 01/12/17 1521 01/12/17 2312   01/12/17 0736  ceFAZolin (ANCEF) 2-4 GM/100ML-% IVPB    Comments:  [name]   : cabinet override      01/12/17 0736 01/12/17 0801   01/12/17 0622  ceFAZolin (ANCEF) IVPB 2g/100 mL premix     2 g 200 mL/hr over 30 Minutes Intravenous On call to O.R. 01/12/17 0622 01/12/17 1158      Assessment/Plan: s/p Procedure(s): LAPAROSCOPY DIAGNOSTIC (N/A) WHIPPLE PROCEDURE (N/A) Continue foley due to urinary output monitoring and epidural in place hold lovenox til epidural out.    lantus for DM/hyperglycemia Mild acute kidney injury.  Improved. Continue epidural.  Add PCA. Transfer to stepdown.   LOS: 2 days    [facility] 01/14/2017

## 2017-01-14 NOTE — Anesthesia Post-op Follow-up Note (Signed)
  Anesthesia Pain Follow-up Note  Patient: Kaylee Mullins  Day #: 2  Date of Follow-up: 01/14/2017 Time: 9:56 AM  Last Vitals:  Vitals:   01/14/17 0800 01/14/17 0851  BP: (!) 173/70   Pulse: 75   Resp: 19   Temp:  36.7 C    Level of Consciousness: alert  Pain: moderate   Side Effects:None  Catheter Site Exam:clean, dry, no drainage  Epidural / Intrathecal    Start     Dose/Rate Route Frequency Ordered Stop   01/12/17 1715  ropivacaine (PF) 2 mg/mL (0.2%) (NAROPIN) injection     14 mL/hr 14 mL/hr  Epidural Continuous 01/12/17 1701         Plan: Modify therapy to improve pain control at surgeon's request.  Infusion was increased yesterday to 14, but when I arrived it was back down to 10?  I increased the infusion to 14, and modified the order.  Also bolused the catheter with 10cc of .5 Ropivicaine.  I ordered a fentanyl IV PCA for her.  Rachel Samples DANIEL

## 2017-01-14 NOTE — Progress Notes (Signed)
Pt in chair at the beginning of this RN's shift. Night shift RN reported pt did will getting from bed to chair requiring one assist. Epidural still in place. Foley removed by night shift RN. At ~1100 pt verbalized urge to void. Denied pain at this time and verbalized ability to feel legs after epidural dose increased. This RN and Nanci Pina RN to the pt's side to assist with BSC. Pt able to stand with assistance from both RN's. D/T pt's abd surgery and incision gait belt not used. As pt took first step, legs weaken and pt began to fall. Both RN's gently guided pt to the floor.  Hard impact with floor and surrounding equipment was avoided. Pt returned to bed. Verbal orders for bedrest given.

## 2017-01-15 ENCOUNTER — Ambulatory Visit: Payer: Medicare Other | Admitting: Nurse Practitioner

## 2017-01-15 ENCOUNTER — Inpatient Hospital Stay (HOSPITAL_COMMUNITY): Payer: Medicare Other

## 2017-01-15 DIAGNOSIS — I10 Essential (primary) hypertension: Secondary | ICD-10-CM

## 2017-01-15 LAB — CBC
HCT: 20.8 % — ABNORMAL LOW (ref 36.0–46.0)
Hemoglobin: 6.8 g/dL — CL (ref 12.0–15.0)
MCH: 31.9 pg (ref 26.0–34.0)
MCHC: 32.7 g/dL (ref 30.0–36.0)
MCV: 97.7 fL (ref 78.0–100.0)
Platelets: 153 10*3/uL (ref 150–400)
RBC: 2.13 MIL/uL — ABNORMAL LOW (ref 3.87–5.11)
RDW: 13.6 % (ref 11.5–15.5)
WBC: 10.8 10*3/uL — ABNORMAL HIGH (ref 4.0–10.5)

## 2017-01-15 LAB — MAGNESIUM: Magnesium: 2 mg/dL (ref 1.7–2.4)

## 2017-01-15 LAB — GLUCOSE, CAPILLARY
Glucose-Capillary: 216 mg/dL — ABNORMAL HIGH (ref 65–99)
Glucose-Capillary: 202 mg/dL — ABNORMAL HIGH (ref 65–99)
Glucose-Capillary: 107 mg/dL — ABNORMAL HIGH (ref 65–99)
Glucose-Capillary: 131 mg/dL — ABNORMAL HIGH (ref 65–99)
Glucose-Capillary: 109 mg/dL — ABNORMAL HIGH (ref 65–99)

## 2017-01-15 LAB — HEMOGLOBIN AND HEMATOCRIT, BLOOD
HCT: 28.8 % — ABNORMAL LOW (ref 36.0–46.0)
Hemoglobin: 9.4 g/dL — ABNORMAL LOW (ref 12.0–15.0)

## 2017-01-15 LAB — COMPREHENSIVE METABOLIC PANEL
ALT: 12 U/L — ABNORMAL LOW (ref 14–54)
AST: 26 U/L (ref 15–41)
Albumin: 4.3 g/dL (ref 3.5–5.0)
Alkaline Phosphatase: 40 U/L (ref 38–126)
Anion gap: 8 (ref 5–15)
BUN: 10 mg/dL (ref 6–20)
CO2: 24 mmol/L (ref 22–32)
Calcium: 8.2 mg/dL — ABNORMAL LOW (ref 8.9–10.3)
Chloride: 107 mmol/L (ref 101–111)
Creatinine, Ser: 0.97 mg/dL (ref 0.44–1.00)
GFR calc Af Amer: >60 mL/min (ref >60)
GFR calc non Af Amer: 55 mL/min — ABNORMAL LOW (ref >60)
Glucose, Bld: 232 mg/dL — ABNORMAL HIGH (ref 65–99)
Potassium: 3.7 mmol/L (ref 3.5–5.1)
Sodium: 139 mmol/L (ref 135–145)
Total Bilirubin: 1 mg/dL (ref 0.3–1.2)
Total Protein: 6.5 g/dL (ref 6.5–8.1)

## 2017-01-15 LAB — CREATININE, URINE, RANDOM: Creatinine, Urine: 241.93 mg/dL

## 2017-01-15 LAB — PHOSPHORUS: Phosphorus: 1.7 mg/dL — ABNORMAL LOW (ref 2.5–4.6)

## 2017-01-15 LAB — PREPARE RBC (CROSSMATCH)

## 2017-01-15 LAB — SODIUM, URINE, RANDOM: Sodium, Ur: <10 mmol/L

## 2017-01-15 MED ORDER — TELMISARTAN-HCTZ 80-25 MG PO TABS: 1.0000 | ORAL_TABLET | Freq: Every day | ORAL | Status: DC

## 2017-01-15 MED ORDER — NICARDIPINE HCL IN NACL 20-0.86 MG/200ML-% IV SOLN
3.0000 mg/h | INTRAVENOUS | Status: DC
  Administered 2017-01-15: 3.5 mg/h via INTRAVENOUS
  Administered 2017-01-15: 5 mg/h via INTRAVENOUS
  Administered 2017-01-16 (×4): 4.5 mg/h via INTRAVENOUS
  Administered 2017-01-17: 4 mg/h via INTRAVENOUS
  Administered 2017-01-17 (×4): 4.5 mg/h via INTRAVENOUS
  Administered 2017-01-17: 5 mg/h via INTRAVENOUS
  Administered 2017-01-18: 4.5 mg/h via INTRAVENOUS
  Administered 2017-01-18: 4 mg/h via INTRAVENOUS
  Administered 2017-01-18: 5 mg/h via INTRAVENOUS
  Administered 2017-01-18: 3 mg/h via INTRAVENOUS
  Administered 2017-01-18: 4.5 mg/h via INTRAVENOUS
  Administered 2017-01-19 (×3): 4 mg/h via INTRAVENOUS
  Administered 2017-01-19: 2 mg/h via INTRAVENOUS
  Administered 2017-01-20: 3 mg/h via INTRAVENOUS
  Administered 2017-01-20: 4 mg/h via INTRAVENOUS
  Administered 2017-01-21: 2 mg/h via INTRAVENOUS
  Administered 2017-01-22: 5 mg/h via INTRAVENOUS
  Filled 2017-01-15 (×28): qty 200

## 2017-01-15 MED ORDER — CLONIDINE HCL 0.2 MG/24HR TD PTWK
0.2000 mg | MEDICATED_PATCH | TRANSDERMAL | Status: DC
  Administered 2017-01-15: 0.2 mg via TRANSDERMAL
  Filled 2017-01-15: qty 1

## 2017-01-15 MED ORDER — METOPROLOL TARTRATE 5 MG/5ML IV SOLN
5.0000 mg | INTRAVENOUS | Status: DC | PRN
Start: 2017-01-15 — End: 2017-01-18
  Administered 2017-01-15 (×2): 5 mg via INTRAVENOUS
  Filled 2017-01-15 (×3): qty 5

## 2017-01-15 MED ORDER — FUROSEMIDE 10 MG/ML IJ SOLN
20.0000 mg | Freq: Once | INTRAMUSCULAR | Status: AC
  Administered 2017-01-15: 20 mg via INTRAVENOUS
  Filled 2017-01-15: qty 2

## 2017-01-15 MED ORDER — HYDROCHLOROTHIAZIDE 25 MG PO TABS
25.0000 mg | ORAL_TABLET | Freq: Every day | ORAL | Status: DC
  Administered 2017-01-15 – 2017-02-05 (×17): 25 mg via ORAL
  Filled 2017-01-15 (×19): qty 1

## 2017-01-15 MED ORDER — IRBESARTAN 300 MG PO TABS
300.0000 mg | ORAL_TABLET | Freq: Every day | ORAL | Status: DC
  Administered 2017-01-15 – 2017-02-05 (×17): 300 mg via ORAL
  Filled 2017-01-15 (×5): qty 1
  Filled 2017-01-15: qty 2
  Filled 2017-01-15 (×5): qty 1
  Filled 2017-01-15: qty 2
  Filled 2017-01-15 (×3): qty 1
  Filled 2017-01-15: qty 2
  Filled 2017-01-15 (×6): qty 1

## 2017-01-15 MED ORDER — SPIRONOLACTONE 25 MG PO TABS
25.0000 mg | ORAL_TABLET | Freq: Every day | ORAL | Status: DC
  Administered 2017-01-19 – 2017-01-20 (×2): 25 mg via ORAL
  Filled 2017-01-15 (×3): qty 1

## 2017-01-15 MED ORDER — POTASSIUM PHOSPHATES 15 MMOLE/5ML IV SOLN
20.0000 meq | Freq: Once | INTRAVENOUS | Status: AC
  Administered 2017-01-15: 20 meq via INTRAVENOUS
  Filled 2017-01-15: qty 4.55

## 2017-01-15 MED ORDER — AMLODIPINE BESYLATE 5 MG PO TABS
5.0000 mg | ORAL_TABLET | Freq: Two times a day (BID) | ORAL | Status: DC | PRN
  Administered 2017-01-15 – 2017-01-19 (×2): 5 mg via ORAL
  Filled 2017-01-15 (×2): qty 1

## 2017-01-15 MED ORDER — SODIUM CHLORIDE 0.9 % IV SOLN
Freq: Once | INTRAVENOUS | Status: AC
  Administered 2017-01-15: 05:00:00 via INTRAVENOUS

## 2017-01-15 MED ORDER — METOPROLOL SUCCINATE ER 50 MG PO TB24
100.0000 mg | ORAL_TABLET | Freq: Every day | ORAL | Status: DC
  Administered 2017-01-15: 100 mg via ORAL
  Filled 2017-01-15 (×2): qty 2

## 2017-01-15 MED ORDER — METOPROLOL TARTRATE 5 MG/5ML IV SOLN
7.5000 mg | INTRAVENOUS | Status: DC
  Administered 2017-01-15: 7.5 mg via INTRAVENOUS
  Filled 2017-01-15: qty 10

## 2017-01-15 MED ORDER — POTASSIUM CHLORIDE IN NACL 20-0.9 MEQ/L-% IV SOLN
INTRAVENOUS | Status: AC
  Administered 2017-01-15: 12:00:00 via INTRAVENOUS
  Administered 2017-01-17: 25 mL/h via INTRAVENOUS
  Administered 2017-01-18 – 2017-01-23 (×4): via INTRAVENOUS
  Filled 2017-01-15 (×7): qty 1000

## 2017-01-15 NOTE — Progress Notes (Signed)
Cardiology Consultation:   Patient ID: ARIZONA SORN; [id number]; [birth year]   Admit date: 01/12/2017 Date of Consult: 01/15/2017  Primary Care Provider: Nolene Ebbs, MD Primary Cardiologist: Harrington Challenger   Patient Profile:   Kaylee Mullins is a 81 y.o. female with a hx of HTN, HLD, DM, prior CVA who I am asked to see today to assist with management of HTN at the request of Dr. [name].  History of Present Illness:   Kaylee Mullins is a 81 yo female with history of HTN, DM, hypothyroidism and prior CVA admitted for removal of pancreatic mass. She has undergone a Whipple procedure on 01/12/17. She has long standing HTN. She has not tolerated Norvasc in the past due to swelling. She did not tolerate Hydralazine yesterday. She has had uncontrolled HTN at every office visit documented since January 2018. She now has elevated blood pressures post op. She is currently on a Nicardipine drip. BP is in the 149F systolic. She has no chest pain or dyspnea. She has abdominal pain.   Past Medical History:  Diagnosis Date  . Anxiety   . Arthritis    knees  . Black tarry stools    05-14-16 negative for occult blood with ER visit- noted in [place].  . Cancer [facility])    thyroid cancer- surgery and radiation  . Chronic kidney disease    questionable mass on kidney. Being followed by Dr Diona Fanti  . Complication of anesthesia    heart rate was really low  . Depression   . Diabetes mellitus    type 2  . Full dentures   . Hypertension   . Hypothyroidism   . Pneumonia   . Spinal stenosis   . Stroke [facility]) 09/2014   left sided weakness    Past Surgical History:  Procedure Laterality Date  . ABDOMINAL HYSTERECTOMY     partial  . cataracts     Removed  11/2015  bilateral  . COLONOSCOPY W/ POLYPECTOMY    . ERCP N/A 05/07/2016   Procedure: ENDOSCOPIC RETROGRADE CHOLANGIOPANCREATOGRAPHY (ERCP);  Surgeon: Clarene Essex, MD;  Location: [facility];  Service: Endoscopy;  Laterality: N/A;  . ERCP N/A  10/16/2016   Procedure: ENDOSCOPIC RETROGRADE CHOLANGIOPANCREATOGRAPHY (ERCP);  Surgeon: Clarene Essex, MD;  Location: [facility] ENDOSCOPY;  Service: Endoscopy;  Laterality: N/A;  . EUS N/A 05/27/2016   Procedure: ESOPHAGEAL ENDOSCOPIC ULTRASOUND (EUS) RADIAL;  Surgeon: Arta Silence, MD;  Location: [facility] ENDOSCOPY;  Service: Endoscopy;  Laterality: N/A;  . FLEXIBLE SIGMOIDOSCOPY  03/29/2012   Procedure: FLEXIBLE SIGMOIDOSCOPY;  Surgeon: Jeryl Columbia, MD;  Location: [facility] ENDOSCOPY;  Service: Endoscopy;  Laterality: N/A;  fleet enema upon arrival  . HOT HEMOSTASIS  03/29/2012   Procedure: HOT HEMOSTASIS (ARGON PLASMA COAGULATION/BICAP);  Surgeon: Jeryl Columbia, MD;  Location: [facility] ENDOSCOPY;  Service: Endoscopy;  Laterality: N/A;  . IR GENERIC HISTORICAL  06/30/2016   IR RADIOLOGIST EVAL & MGMT 06/30/2016 Aletta Edouard, MD [facility] INTERV RAD  . IR GENERIC HISTORICAL  09/09/2016   IR RADIOLOGIST EVAL & MGMT 09/09/2016 Aletta Edouard, MD [facility] INTERV RAD  . LAPAROSCOPY N/A 01/12/2017   Procedure: LAPAROSCOPY DIAGNOSTIC;  Surgeon: Stark Klein, MD;  Location: [place];  Service: General;  Laterality: N/A;  . LUMBAR LAMINECTOMY/DECOMPRESSION MICRODISCECTOMY N/A 02/10/2016   Procedure: Lumbar three- four Laminectomy;  Surgeon: Kristeen Miss, MD;  Location: [place] NEURO ORS;  Service: Neurosurgery;  Laterality: N/A;  L3-4 Laminectomy  . LUMBAR SPINE SURGERY     1st surgery "  ray cage placed"  . THYROIDECTOMY  2005  . TONSILLECTOMY    . WHIPPLE PROCEDURE N/A 01/12/2017   Procedure: WHIPPLE PROCEDURE;  Surgeon: Stark Klein, MD;  Location: [place];  Service: General;  Laterality: N/A;     Inpatient Medications: Scheduled Meds: . fentaNYL   Intravenous Q4H  . furosemide  40 mg Intravenous Daily  . irbesartan  300 mg Oral Daily   And  . hydrochlorothiazide  25 mg Oral Daily  . insulin aspart  0-15 Units Subcutaneous Q4H  . insulin glargine  30 Units Subcutaneous q morning - 10a  . levothyroxine  100 mcg Intravenous Daily  .  metoprolol succinate  100 mg Oral Daily  . pantoprazole (PROTONIX) IV  40 mg Intravenous QHS   Continuous Infusions: . 0.9 % NaCl with KCl 20 mEq / L 25 mL/hr at 01/15/17 1205  . methocarbamol (ROBAXIN)  IV Stopped (01/14/17 0500)  . niCARDipine 3 mg/hr (01/15/17 1618)  . potassium phosphate IVPB (mEq) 20 mEq (01/15/17 1442)  . ropivacaine (PF) 2 mg/mL (0.2%) 14 mL/hr (01/15/17 0901)   PRN Meds: acetaminophen **OR** acetaminophen, albuterol, amLODipine, diphenhydrAMINE **OR** diphenhydrAMINE, fentaNYL (SUBLIMAZE) injection, methocarbamol (ROBAXIN)  IV, metoprolol tartrate, naloxone **AND** sodium chloride flush, ondansetron **OR** ondansetron (ZOFRAN) IV  Allergies:    Allergies  Allergen Reactions  . Hydralazine Hcl Shortness Of Breath    Pt has severe respiratory distress after receiving dose  . Darvon [Propoxyphene Hcl] Other (See Comments)    hallucinations  . Nyquil Multi-Symptom [Pseudoeph-Doxylamine-Dm-Apap] Other (See Comments)    Makes pt not "feel right in her head"  . Metformin And Related Diarrhea    Social History:   Social History   Social History  . Marital status: Widowed    Spouse name: N/A  . Number of children: N/A  . Years of education: N/A   Occupational History  . retired     Buyer, retail   Social History Main Topics  . Smoking status: Never Smoker  . Smokeless tobacco: Never Used  . Alcohol use No  . Drug use: No  . Sexual activity: No   Other Topics Concern  . Not on file   Social History Narrative   Separated, retired, 3 children living, 2 children deceased   Caffeine use - soda every few days   Right handed   Pt lives alone.  Using cane when out and about.     Family History:   The patient's family history includes Heart attack in her father; Heart failure in her mother.  ROS:  Please see the history of present illness.  All other ROS reviewed and negative.     Physical Exam/Data:   Vitals:   01/15/17 1545 01/15/17 1600  01/15/17 1615 01/15/17 1616  BP: (!) 178/66 (!) 170/62 (!) 174/62   Pulse: 72 66 67   Resp: (!) 22 19 (!) 22 16  Temp:      TempSrc:      SpO2: 100% 98% 98% 96%  Weight:      Height:        Intake/Output Summary (Last 24 hours) at 01/15/17 1634 Last data filed at 01/15/17 1600  Gross per 24 hour  Intake          3663.92 ml  Output             4550 ml  Net          -886.08 ml   Filed Weights   01/12/17 1553 01/14/17 0600  Weight: 215 lb 6.2 oz (97.7 kg) 216 lb 4.3 oz (98.1 kg)   Body mass index is 37.12 kg/m.   General:  Well nourished, well developed, in no acute distress HEENT: normal Lymph: no adenopathy Neck: no JVD Endocrine:  No thryomegaly Vascular: No carotid bruits; FA pulses 2+ bilaterally without bruits  Cardiac:  normal S1, S2; RRR; no murmur  Lungs:  clear to auscultation bilaterally, no wheezing, rhonchi or rales  Abd: firm. BS present  Ext: no edema Musculoskeletal:  No deformities, BUE and BLE strength normal and equal Skin: warm and dry  Neuro:  CNs 2-12 intact, no focal abnormalities noted Psych:  Normal affect    EKG: No EKG in chart  Tele: sinus  Laboratory Data:  Chemistry Recent Labs Lab 01/13/17 0423 01/14/17 0408 01/15/17 0248  NA 136 137 139  K 3.8 3.8 3.7  CL 105 105 107  CO2 23 24 24   GLUCOSE 313* 264* 232*  BUN 17 12 10   CREATININE 1.43* 1.00 0.97  CALCIUM 7.6* 8.0* 8.2*  GFRNONAA 34* 53* 55*  GFRAA 40* >60 >60  ANIONGAP 8 8 8      Recent Labs Lab 01/13/17 0423 01/14/17 0408 01/15/17 0248  PROT 4.6* 6.0* 6.5  ALBUMIN 2.3* 3.5 4.3  AST 37 30 26  ALT 18 14 12*  ALKPHOS 45 70 40  BILITOT 0.5 1.0 1.0   Hematology Recent Labs Lab 01/13/17 0423 01/14/17 0408 01/15/17 0248 01/15/17 1415  WBC 11.0* 12.2* 10.8*  --   RBC 2.46* 2.31* 2.13*  --   HGB 7.8* 7.2* 6.8* 9.4*  HCT 23.7* 22.4* 20.8* 28.8*  MCV 96.3 97.0 97.7  --   MCH 31.7 31.2 31.9  --   MCHC 32.9 32.1 32.7  --   RDW 13.1 13.5 13.6  --   PLT 174 164  153  --     Radiology/Studies:  Dg Chest 1 View  Result Date: 01/12/2017 CLINICAL DATA:  Encounter for central line placement EXAM: CHEST 1 VIEW COMPARISON:  11/25/2016 chest CT FINDINGS: Right IJ central line with tip at the SVC level. No evidence of pneumothorax. A nasogastric tube reaches the stomach at least. Mild retrocardiac atelectasis. No pulmonary edema or pleural effusion. IMPRESSION: 1. Central line with tip at the [facility].  No pneumothorax. 2. Cardiomegaly and mild atelectasis. Electronically Signed   By: Monte Fantasia M.D.   On: 01/12/2017 14:42   Dg Chest Port 1 View  Result Date: 01/15/2017 CLINICAL DATA:  Increased shortness of breath. Epigastric pain this morning. EXAM: PORTABLE CHEST 1 VIEW COMPARISON:  01/12/2017 FINDINGS: Right central line in good position. Small wire overlies the medial aspect of the left hemithorax of unknown etiology. New atelectasis at the right lung base medially. Persistent slight atelectasis the left base noted. Chronic cardiomegaly.  Pulmonary vascularity is normal. IMPRESSION: 1. New slight atelectasis at the right lung base medially. 2. Persistent slight atelectasis at the left base medially. 3. Small wire overlies the left hemithorax of unknown etiology. Electronically Signed   By: Lorriane Shire M.D.   On: 01/15/2017 11:56    Assessment and Plan:   1. HTN: She has long standing uncontrolled hypertension. I have reviewed all of her clinic notes since January of 7829 and her systolic BP as an outpatient is always over 200. I am not sure we will be able to get her BP into a normal range during this hospital stay. She is currently on Toprol, Irbesartan and HCTZ. She did not tolerate  Hydralazine due to SOB (although this was also during a blood transfusion). She reports lower ext edema with use of Norvasc.  -Continue Nicardipine drip for now -Continue beta blocker, ARB, HCTZ.  -I will add aldactone 25 mg daily -Avoid Norvasc and Hydralazine -Consider  renal artery dopplers as outpatient to exclude renal artery stenosis -Consider minoxidil    Signed, Lauree Chandler, MD 01/15/2017 4:34 PM

## 2017-01-15 NOTE — Significant Event (Signed)
Patient with persistent hypertension >200 SBP despite oral and IV antihypertensive medications. Patient complain of SOB during blood transfusion. RN increased O2 to 4L, ran infusions at lower rates (blood and maintenance fluids). Staff changed blood pressures cuffs, used a different BP cable, and used other extremities-still with elevated BPs. RN notified general surgery and spoke with Will. Updated him with patient's blood pressures, SOB, and this morning electrolytes. PA to come to bedside.     Kaylee Mullins

## 2017-01-15 NOTE — Anesthesia Post-op Follow-up Note (Signed)
  Anesthesia Pain Follow-up Note  Patient: Kaylee Mullins  Day #: 3  Date of Follow-up: 01/15/2017 Time: 7:25 AM  Last Vitals:  Vitals:   01/15/17 0630 01/15/17 0700  BP: (!) 179/71 (!) 182/73  Pulse: 71 66  Resp: 18 17  Temp:      Level of Consciousness: alert  Pain: mild   Side Effects:None  Catheter Site Exam:clean, dry  Epidural / Intrathecal    Start     Dose/Rate Route Frequency Ordered Stop   01/14/17 1300  ropivacaine (PF) 2 mg/mL (0.2%) (NAROPIN) injection     14 mL/hr 14 mL/hr  Epidural Continuous 01/14/17 1231         Plan: Continue current therapy of postop epidural at surgeon's request.  Better pain control last night.  Starting ice chips  [name] DANIEL

## 2017-01-15 NOTE — Progress Notes (Addendum)
3 Days Post-Op   Subjective/Chief Complaint: Pt had high blood pressure last night, but also felt worse this AM.  Is getting blood.  Also had an episode of shortness of breath with hydralazine last night.    Objective: Vital signs in last 24 hours: Temp:  [97.5 F (36.4 C)-99.7 F (37.6 C)] 98.7 F (37.1 C) (06/01 0608) Pulse Rate:  [62-86] 71 (06/01 0630) Resp:  [17-34] 18 (06/01 0630) BP: (103-207)/(40-161) 179/71 (06/01 0630) SpO2:  [94 %-100 %] 96 % (06/01 0630) FiO2 (%):  [21 %] 21 % (05/31 1500) Last BM Date: 01/11/17  Intake/Output from previous day: 05/31 0701 - 06/01 0700 In: 4188 [I.V.:2350; Blood:30; IV Piggyback:610] Out: 2210 [Urine:1275; Emesis/NG output:550; Drains:385] Intake/Output this shift: No intake/output data recorded.  General appearance: alert, cooperative and no distress Resp: breathing comfortably GI: soft, mildly distended, approp tender. Extremities: extremities normal, atraumatic, no cyanosis or edema Drains serosang.  Lab Results:   Recent Labs  01/14/17 0408 01/15/17 0248  WBC 12.2* 10.8*  HGB 7.2* 6.8*  HCT 22.4* 20.8*  PLT 164 153   BMET  Recent Labs  01/14/17 0408 01/15/17 0248  NA 137 139  K 3.8 3.7  CL 105 107  CO2 24 24  GLUCOSE 264* 232*  BUN 12 10  CREATININE 1.00 0.97  CALCIUM 8.0* 8.2*   PT/INR  Recent Labs  01/13/17 0423  LABPROT 18.7*  INR 1.55   ABG  Recent Labs  01/12/17 0948 01/12/17 1300  PHART 7.430 7.349*  HCO3 24.9 25.5    Studies/Results: No results found.  Anti-infectives: Anti-infectives    Start     Dose/Rate Route Frequency Ordered Stop   01/12/17 1530  ceFAZolin (ANCEF) IVPB 2g/100 mL premix     2 g 200 mL/hr over 30 Minutes Intravenous Every 8 hours 01/12/17 1521 01/12/17 2312   01/12/17 0736  ceFAZolin (ANCEF) 2-4 GM/100ML-% IVPB    Comments:  [name]   : cabinet override      01/12/17 0736 01/12/17 0801   01/12/17 0622  ceFAZolin (ANCEF) IVPB 2g/100 mL premix     2 g 200 mL/hr over 30 Minutes Intravenous On call to O.R. 01/12/17 0622 01/12/17 1158      Assessment/Plan: s/p Procedure(s): LAPAROSCOPY DIAGNOSTIC (N/A) WHIPPLE PROCEDURE (N/A) Continue foley due to urinary output monitoring and epidural in place hold lovenox til epidural out.    lantus for DM/hyperglycemia Mild acute kidney injury.  Improved. Continue epidural.  PCA Hold on transfer given hypertension and acute blood loss anemia.     LOS: 3 days    [facility] 01/15/2017

## 2017-01-15 NOTE — Progress Notes (Signed)
Notified Dr. [name] regarding pts hgb 6.8 and pts SBP 180s-190s. New orders received to transfuse 2 UPRBC, clonidine patch, increase frequency of lopressor, and d/c transfer orders to 4E. Will follow orders and continue to monitor pt.

## 2017-01-15 NOTE — Progress Notes (Signed)
3 Days Post-Op    CC:  Hypertensive and SOB  Subjective: Called by nursing staff, BP is up she complains of SOB, most of this is systolic hypertension HR in the 60's.  She is getting blood, second unit, nurse running it slowly because she complains of SOB.  Sats are 93-100% on nasal cannula, at 2-4 liters. catapress 0.2 patch this Am Lasix 40 Irbesartan 300 PO Hctz 25 Toprol xl 100 mg IV lopressor 5 mg x 2 doses this AM   - 25 mg IV so far this AM 7.5 mg this AM x 2  Objective: Vital signs in last 24 hours: Temp:  [97.5 F (36.4 C)-99.7 F (37.6 C)] 99 F (37.2 C) (06/01 0906) Pulse Rate:  [60-86] 60 (06/01 1100) Resp:  [16-34] 24 (06/01 1100) BP: (133-208)/(63-161) 206/86 (06/01 1100) SpO2:  [93 %-100 %] 95 % (06/01 1100) FiO2 (%):  [21 %] 21 % (05/31 1500) Last BM Date: 01/11/17 4300 IV 1295 urine 550 NG yesterday - d/ced this AM by DR.Byerly  385 from the drain No weight, 7 liter positive so far Glucose 200's  Getting film now Phos is low Intake/Output from previous day: 05/31 0701 - 06/01 0700 In: 4302 [I.V.:2450; Blood:30; IV GBTDVVOHY:073] Out: 2230 [Urine:1295; Emesis/NG output:550; Drains:385] Intake/Output this shift: Total I/O In: 414.2 [I.V.:79.2; Blood:335] Out: 560 [Urine:410; Drains:150]  General appearance: alert, cooperative, NAD but appears uncomfortable Resp: effort normal, mild bilateral expiratory wheezing Cardio: regular rate and rhythm, S1, S2 normal, no murmur, click, rub or gallop GI: midline incision cdi with staples intact/no erythema or drainage, abdomen soft, mild global tenderness, +BS  Lab Results:   Recent Labs  01/14/17 0408 01/15/17 0248  WBC 12.2* 10.8*  HGB 7.2* 6.8*  HCT 22.4* 20.8*  PLT 164 153    BMET  Recent Labs  01/14/17 0408 01/15/17 0248  NA 137 139  K 3.8 3.7  CL 105 107  CO2 24 24  GLUCOSE 264* 232*  BUN 12 10  CREATININE 1.00 0.97  CALCIUM 8.0* 8.2*   PT/INR  Recent Labs  01/13/17 0423   LABPROT 18.7*  INR 1.55     Recent Labs Lab 01/13/17 0423 01/14/17 0408 01/15/17 0248  AST 37 30 26  ALT 18 14 12*  ALKPHOS 45 70 40  BILITOT 0.5 1.0 1.0  PROT 4.6* 6.0* 6.5  ALBUMIN 2.3* 3.5 4.3     Lipase     Component Value Date/Time   LIPASE 18 05/14/2016 1258     Prior to Admission medications   Medication Sig Start Date End Date Taking? Authorizing Provider  acetaminophen (TYLENOL) 500 MG tablet Take 500-1,000 mg by mouth every 6 (six) hours as needed for mild pain or moderate pain (depends on pain if takes 1-2 tablets).   Yes [provider]  clopidogrel (PLAVIX) 75 MG tablet Take 1 tablet (75 mg total) by mouth daily. Resume on Sunday please Patient taking differently: Take 75 mg by mouth daily.  05/07/16  Yes Magod, Altamese Dilling, MD  cyanocobalamin 1000 MCG tablet Take 1,000 mcg by mouth daily.   Yes [provider]  furosemide (LASIX) 40 MG tablet TAKE 1 TABLET BY MOUTH EVERY DAY AS NEEDED FOR LEG SWELLING 05/28/15  Yes [provider]  HUMALOG MIX 50/50 KWIKPEN (50-50) 100 UNIT/ML Kwikpen INJECT 90 UNITS BEFORE BREAKFAST AND 60 UNITS BEFORE EVENING MEAL 12/03/15  Yes [provider]  levothyroxine (SYNTHROID, LEVOTHROID) 200 MCG tablet Take 200 mcg by mouth daily before breakfast. For  hypothyroidism 11/11/15  Yes [provider]  metoprolol succinate (TOPROL-XL) 100 MG 24 hr tablet Take 100 mg by mouth daily. Take with or immediately following a meal.    Yes [provider]  oxyCODONE-acetaminophen (PERCOCET/ROXICET) 5-325 MG tablet Take 1-2 tablets by mouth every 4 (four) hours as needed for moderate pain. Patient taking differently: Take 0.5-1 tablets by mouth every 4 (four) hours as needed for moderate pain. Depends on pain if takes 0.5-1 tablet 02/14/16  Yes Elsner, Mallie Mussel, MD  potassium chloride SA (K-DUR,KLOR-CON) 20 MEQ tablet Take 20 mEq by mouth 2 (two) times daily.   Yes [provider]  rosuvastatin  (CRESTOR) 20 MG tablet Take 20 mg by mouth daily.  05/02/16  Yes [provider]  telmisartan-hydrochlorothiazide (MICARDIS HCT) 80-25 MG tablet Take 1 tablet by mouth daily.  05/01/16  Yes [provider]    Medications: . cloNIDine  0.2 mg Transdermal Weekly  . fentaNYL   Intravenous Q4H  . furosemide  40 mg Intravenous Daily  . irbesartan  300 mg Oral Daily   And  . hydrochlorothiazide  25 mg Oral Daily  . insulin aspart  0-15 Units Subcutaneous Q4H  . insulin glargine  30 Units Subcutaneous q morning - 10a  . levothyroxine  100 mcg Intravenous Daily  . metoprolol succinate  100 mg Oral Daily  . pantoprazole (PROTONIX) IV  40 mg Intravenous QHS   . dextrose 5 % and 0.45 % NaCl with KCl 20 mEq/L 50 mL/hr at 01/15/17 0735  . methocarbamol (ROBAXIN)  IV Stopped (01/14/17 0500)  . ropivacaine (PF) 2 mg/mL (0.2%) 14 mL/hr (01/15/17 0901)   Anti-infectives    Start     Dose/Rate Route Frequency Ordered Stop   01/12/17 1530  ceFAZolin (ANCEF) IVPB 2g/100 mL premix     2 g 200 mL/hr over 30 Minutes Intravenous Every 8 hours 01/12/17 1521 01/12/17 2312   01/12/17 0736  ceFAZolin (ANCEF) 2-4 GM/100ML-% IVPB    Comments:  [name]   : cabinet override      01/12/17 0736 01/12/17 0801   01/12/17 0622  ceFAZolin (ANCEF) IVPB 2g/100 mL premix     2 g 200 mL/hr over 30 Minutes Intravenous On call to O.R. 01/12/17 0622 01/12/17 1158      Assessment/Plan  LAPAROSCOPY DIAGNOSTIC (N/A) WHIPPLE PROCEDURE (N/A) Continue foley due to urinary output monitoring and epidural in place hold lovenox til epidural out.    lantus for DM/hyperglycemia Mild acute kidney injury.  Improved. Continue epidural.  PCA Hold on transfer given hypertension and anemia.   transfusing 2 units of PRBC EF 18-33% Grade 2 diastolic dysfunction on Echo 01/2014 FEN:  IV fluids/NPO ID:  Pre op only DVT:  Epidural in  Plan:  CXR, change IV fluids, and decrease volume for now.  Norvasc 5 mg  q12h PRN SBP 170 or higher.  Lasix after the last unit of prbc. Duonebs q4hr PRN wheezing Recheck H&H at 1500 today. Continue to monitor.  Addendum: CXR - shows slight increase in atelectasis, no other acute findings.    LOS: 3 days    JENNINGS,WILLARD 01/15/2017 [phone]

## 2017-01-15 NOTE — Progress Notes (Signed)
Inpatient Diabetes Program Recommendations  AACE/ADA: New Consensus Statement on Inpatient Glycemic Control (2015)  Target Ranges:  Prepandial:   less than 140 mg/dL      Peak postprandial:   less than 180 mg/dL (1-2 hours)      Critically ill patients:  140 - 180 mg/dL   Lab Results  Component Value Date   GLUCAP 216 (H) 01/15/2017   HGBA1C 9.9 (H) 01/04/2017    Review of Glycemic Control Results for XOCHILT, CONANT (MRN [id number]) as of 01/15/2017 11:37  Ref. Range 01/14/2017 12:48 01/14/2017 17:29 01/14/2017 19:36 01/14/2017 23:30 01/15/2017 03:48  Glucose-Capillary Latest Ref Range: 65 - 99 mg/dL 191 (H) 201 (H) 198 (H) 237 (H) 216 (H)   Inpatient Diabetes Program Recommendations:  Please consider increase in Lantus to 35 units daily.  Thank you, Nani Gasser. Junetta Hearn, RN, MSN, CDE  Diabetes Coordinator Inpatient Glycemic Control Team Team Pager [phone] (8am-5pm) 01/15/2017 11:45 AM

## 2017-01-16 LAB — GLUCOSE, CAPILLARY
Glucose-Capillary: 114 mg/dL — ABNORMAL HIGH (ref 65–99)
Glucose-Capillary: 115 mg/dL — ABNORMAL HIGH (ref 65–99)
Glucose-Capillary: 123 mg/dL — ABNORMAL HIGH (ref 65–99)
Glucose-Capillary: 123 mg/dL — ABNORMAL HIGH (ref 65–99)
Glucose-Capillary: 129 mg/dL — ABNORMAL HIGH (ref 65–99)
Glucose-Capillary: 139 mg/dL — ABNORMAL HIGH (ref 65–99)
Glucose-Capillary: 95 mg/dL (ref 65–99)

## 2017-01-16 LAB — CBC
HCT: 29.9 % — ABNORMAL LOW (ref 36.0–46.0)
Hemoglobin: 9.8 g/dL — ABNORMAL LOW (ref 12.0–15.0)
MCH: 31 pg (ref 26.0–34.0)
MCHC: 32.8 g/dL (ref 30.0–36.0)
MCV: 94.6 fL (ref 78.0–100.0)
Platelets: 188 10*3/uL (ref 150–400)
RBC: 3.16 MIL/uL — ABNORMAL LOW (ref 3.87–5.11)
RDW: 15.1 % (ref 11.5–15.5)
WBC: 10.6 10*3/uL — ABNORMAL HIGH (ref 4.0–10.5)

## 2017-01-16 LAB — COMPREHENSIVE METABOLIC PANEL
ALT: 16 U/L (ref 14–54)
AST: 31 U/L (ref 15–41)
Albumin: 3.3 g/dL — ABNORMAL LOW (ref 3.5–5.0)
Alkaline Phosphatase: 56 U/L (ref 38–126)
Anion gap: 8 (ref 5–15)
BUN: 10 mg/dL (ref 6–20)
CO2: 29 mmol/L (ref 22–32)
Calcium: 8.5 mg/dL — ABNORMAL LOW (ref 8.9–10.3)
Chloride: 104 mmol/L (ref 101–111)
Creatinine, Ser: 0.81 mg/dL (ref 0.44–1.00)
GFR calc Af Amer: >60 mL/min (ref >60)
GFR calc non Af Amer: >60 mL/min (ref >60)
Glucose, Bld: 126 mg/dL — ABNORMAL HIGH (ref 65–99)
Potassium: 3.6 mmol/L (ref 3.5–5.1)
Sodium: 141 mmol/L (ref 135–145)
Total Bilirubin: 1.9 mg/dL — ABNORMAL HIGH (ref 0.3–1.2)
Total Protein: 6.3 g/dL — ABNORMAL LOW (ref 6.5–8.1)

## 2017-01-16 LAB — PHOSPHORUS: Phosphorus: 2.7 mg/dL (ref 2.5–4.6)

## 2017-01-16 MED ORDER — SODIUM CHLORIDE 0.9% FLUSH
10.0000 mL | Freq: Two times a day (BID) | INTRAVENOUS | Status: DC
  Administered 2017-01-17 – 2017-01-26 (×14): 10 mL
  Administered 2017-01-27: 40 mL
  Administered 2017-01-27 – 2017-02-05 (×6): 10 mL

## 2017-01-16 MED ORDER — ORAL CARE MOUTH RINSE
15.0000 mL | Freq: Two times a day (BID) | OROMUCOSAL | Status: DC
  Administered 2017-01-17: 15 mL via OROMUCOSAL

## 2017-01-16 MED ORDER — CHLORHEXIDINE GLUCONATE 0.12 % MT SOLN
15.0000 mL | Freq: Two times a day (BID) | OROMUCOSAL | Status: DC
  Administered 2017-01-16 – 2017-01-19 (×5): 15 mL via OROMUCOSAL
  Filled 2017-01-16 (×4): qty 15

## 2017-01-16 MED ORDER — CHLORHEXIDINE GLUCONATE CLOTH 2 % EX PADS
6.0000 | MEDICATED_PAD | Freq: Every day | CUTANEOUS | Status: DC
  Administered 2017-01-17 – 2017-01-29 (×10): 6 via TOPICAL

## 2017-01-16 MED ORDER — MINOXIDIL 2.5 MG PO TABS
2.5000 mg | ORAL_TABLET | Freq: Every day | ORAL | Status: DC
  Administered 2017-01-18 – 2017-01-21 (×4): 2.5 mg via ORAL
  Filled 2017-01-16 (×7): qty 1

## 2017-01-16 MED ORDER — SODIUM CHLORIDE 0.9% FLUSH: 10.0000 mL | INTRAVENOUS | Status: DC | PRN

## 2017-01-16 NOTE — Anesthesia Post-op Follow-up Note (Signed)
  Anesthesia Pain Follow-up Note  Patient: Kaylee Mullins  Day #: 4  Date of Follow-up: 01/16/2017 Time: 12:29 PM  Last Vitals:  Vitals:   01/16/17 1000 01/16/17 1100  BP: (!) 134/55   Pulse: 66   Resp: 19   Temp:  37.1 C    Level of Consciousness: alert  Pain: moderate   Side Effects:None  Catheter Site Exam:clean, dry, no drainage  Epidural / Intrathecal    Start     Dose/Rate Route Frequency Ordered Stop   01/14/17 1300  ropivacaine (PF) 2 mg/mL (0.2%) (NAROPIN) injection     14 mL/hr 14 mL/hr  Epidural Continuous 01/14/17 1231         Plan: Continue current therapy of postop epidural at surgeon's request  Catalina Gravel

## 2017-01-16 NOTE — Progress Notes (Signed)
4 Days Post-Op   Subjective/Chief Complaint: Does not feel well today Having some increased upper abdominal pain and nausea   Objective: Vital signs in last 24 hours: Temp:  [98.5 F (36.9 C)-99.3 F (37.4 C)] 98.9 F (37.2 C) (06/02 0715) Pulse Rate:  [56-89] 72 (06/02 0715) Resp:  [15-36] 24 (06/02 0715) BP: (129-218)/(52-140) 140/53 (06/02 0715) SpO2:  [88 %-100 %] 96 % (06/02 0715) Last BM Date: 01/11/17  Intake/Output from previous day: 06/01 0701 - 06/02 0700 In: 2722.1 [P.O.:120; I.V.:1339.9; Blood:670; IV Piggyback:256.2] Out: 6962 [Urine:4460; Drains:700] Intake/Output this shift: No intake/output data recorded.  Exam: Awake Lungs clear Abdomen soft, non-distended, minimally tender, bile tinged fluid in both drains  Lab Results:   Recent Labs  01/15/17 0248 01/15/17 1415 01/16/17 0458  WBC 10.8*  --  10.6*  HGB 6.8* 9.4* 9.8*  HCT 20.8* 28.8* 29.9*  PLT 153  --  188   BMET  Recent Labs  01/15/17 0248 01/16/17 0458  NA 139 141  K 3.7 3.6  CL 107 104  CO2 24 29  GLUCOSE 232* 126*  BUN 10 10  CREATININE 0.97 0.81  CALCIUM 8.2* 8.5*   PT/INR No results for input(s): LABPROT, INR in the last 72 hours. ABG No results for input(s): PHART, HCO3 in the last 72 hours.  Invalid input(s): PCO2, PO2  Studies/Results: Dg Chest Port 1 View  Result Date: 01/15/2017 CLINICAL DATA:  Increased shortness of breath. Epigastric pain this morning. EXAM: PORTABLE CHEST 1 VIEW COMPARISON:  01/12/2017 FINDINGS: Right central line in good position. Small wire overlies the medial aspect of the left hemithorax of unknown etiology. New atelectasis at the right lung base medially. Persistent slight atelectasis the left base noted. Chronic cardiomegaly.  Pulmonary vascularity is normal. IMPRESSION: 1. New slight atelectasis at the right lung base medially. 2. Persistent slight atelectasis at the left base medially. 3. Small wire overlies the left hemithorax of unknown  etiology. Electronically Signed   By: Lorriane Shire M.D.   On: 01/15/2017 11:56    Anti-infectives: Anti-infectives    Start     Dose/Rate Route Frequency Ordered Stop   01/12/17 1530  ceFAZolin (ANCEF) IVPB 2g/100 mL premix     2 g 200 mL/hr over 30 Minutes Intravenous Every 8 hours 01/12/17 1521 01/12/17 2312   01/12/17 0736  ceFAZolin (ANCEF) 2-4 GM/100ML-% IVPB    Comments:  [name]   : cabinet override      01/12/17 0736 01/12/17 0801   01/12/17 0622  ceFAZolin (ANCEF) IVPB 2g/100 mL premix     2 g 200 mL/hr over 30 Minutes Intravenous On call to O.R. 01/12/17 0622 01/12/17 1158      Assessment/Plan: s/p Procedure(s): LAPAROSCOPY DIAGNOSTIC (N/A) WHIPPLE PROCEDURE (N/A)  Will leave in ICU and continue sips Repeat labs tomorrow Drain output increasing   LOS: 4 days    Lucianna Ostlund A 01/16/2017

## 2017-01-16 NOTE — Progress Notes (Signed)
Per Dr. Rush Farmer:  Pt to not have any po meds, no ice chips at this time.

## 2017-01-16 NOTE — Progress Notes (Signed)
Progress Note  Patient Name: Kaylee Mullins Date of Encounter: 01/16/2017  Primary Cardiologist: Harrington Challenger  Subjective   Has moderate abdominal pain and mild nausea. Otherwise no CV compliants. Excellent BP, actually rather low, but also has epidural and fentanyl PCA.  Inpatient Medications    Scheduled Meds: . fentaNYL   Intravenous Q4H  . furosemide  40 mg Intravenous Daily  . irbesartan  300 mg Oral Daily   And  . hydrochlorothiazide  25 mg Oral Daily  . insulin aspart  0-15 Units Subcutaneous Q4H  . insulin glargine  30 Units Subcutaneous q morning - 10a  . levothyroxine  100 mcg Intravenous Daily  . metoprolol succinate  100 mg Oral Daily  . pantoprazole (PROTONIX) IV  40 mg Intravenous QHS  . spironolactone  25 mg Oral Daily   Continuous Infusions: . 0.9 % NaCl with KCl 20 mEq / L 25 mL/hr at 01/16/17 0700  . methocarbamol (ROBAXIN)  IV Stopped (01/14/17 0500)  . niCARDipine 4.5 mg/hr (01/16/17 1047)  . ropivacaine (PF) 2 mg/mL (0.2%) 14 mL/hr (01/16/17 0700)   PRN Meds: acetaminophen **OR** acetaminophen, albuterol, amLODipine, diphenhydrAMINE **OR** diphenhydrAMINE, fentaNYL (SUBLIMAZE) injection, methocarbamol (ROBAXIN)  IV, metoprolol tartrate, naloxone **AND** sodium chloride flush, ondansetron **OR** ondansetron (ZOFRAN) IV   Vital Signs    Vitals:   01/16/17 0800 01/16/17 0900 01/16/17 1000 01/16/17 1100  BP: 135/70 (!) 153/56 (!) 134/55   Pulse: 68 71 66   Resp: (!) 22 18 19    Temp:    98.8 F (37.1 C)  TempSrc:    Oral  SpO2: 98% 97% 96%   Weight:      Height:        Intake/Output Summary (Last 24 hours) at 01/16/17 1141 Last data filed at 01/16/17 1000  Gross per 24 hour  Intake          2391.45 ml  Output             4250 ml  Net         -1858.55 ml   Filed Weights   01/12/17 1553 01/14/17 0600  Weight: 215 lb 6.2 oz (97.7 kg) 216 lb 4.3 oz (98.1 kg)    Telemetry    NSR with PACs - Personally Reviewed  ECG    No recent - Personally  Reviewed  Physical Exam  Calm, quiet GEN: No acute distress.   Neck: No JVD Cardiac: RRR, no murmurs, rubs, or gallops.  Respiratory: Clear to auscultation bilaterally. GI: Soft, tender, non-distended  MS: No edema; No deformity. Neuro:  Nonfocal  Psych: Normal affect   Labs    Chemistry Recent Labs Lab 01/14/17 0408 01/15/17 0248 01/16/17 0458  NA 137 139 141  K 3.8 3.7 3.6  CL 105 107 104  CO2 24 24 29   GLUCOSE 264* 232* 126*  BUN 12 10 10   CREATININE 1.00 0.97 0.81  CALCIUM 8.0* 8.2* 8.5*  PROT 6.0* 6.5 6.3*  ALBUMIN 3.5 4.3 3.3*  AST 30 26 31   ALT 14 12* 16  ALKPHOS 70 40 56  BILITOT 1.0 1.0 1.9*  GFRNONAA 53* 55* >60  GFRAA >60 >60 >60  ANIONGAP 8 8 8      Hematology Recent Labs Lab 01/14/17 0408 01/15/17 0248 01/15/17 1415 01/16/17 0458  WBC 12.2* 10.8*  --  10.6*  RBC 2.31* 2.13*  --  3.16*  HGB 7.2* 6.8* 9.4* 9.8*  HCT 22.4* 20.8* 28.8* 29.9*  MCV 97.0 97.7  --  94.6  MCH 31.2 31.9  --  31.0  MCHC 32.1 32.7  --  32.8  RDW 13.5 13.6  --  15.1  PLT 164 153  --  188    Cardiac EnzymesNo results for input(s): TROPONINI in the last 168 hours. No results for input(s): TROPIPOC in the last 168 hours.   BNPNo results for input(s): BNP, PROBNP in the last 168 hours.   DDimer No results for input(s): DDIMER in the last 168 hours.   Radiology    Dg Chest Port 1 View  Result Date: 01/15/2017 CLINICAL DATA:  Increased shortness of breath. Epigastric pain this morning. EXAM: PORTABLE CHEST 1 VIEW COMPARISON:  01/12/2017 FINDINGS: Right central line in good position. Small wire overlies the medial aspect of the left hemithorax of unknown etiology. New atelectasis at the right lung base medially. Persistent slight atelectasis the left base noted. Chronic cardiomegaly.  Pulmonary vascularity is normal. IMPRESSION: 1. New slight atelectasis at the right lung base medially. 2. Persistent slight atelectasis at the left base medially. 3. Small wire overlies the  left hemithorax of unknown etiology. Electronically Signed   By: Lorriane Shire M.D.   On: 01/15/2017 11:56    Cardiac Studies  ECHO 01/30/2016 - Left ventricle: The cavity size was normal. There was mild   concentric hypertrophy. Systolic function was normal. The   estimated ejection fraction was in the range of 55% to 60%. Wall   motion was normal; there were no regional wall motion   abnormalities. Features are consistent with a pseudonormal left   ventricular filling pattern, with concomitant abnormal relaxation   and increased filling pressure (grade 2 diastolic dysfunction). - Aortic valve: There was trivial regurgitation. Mean gradient (S):   7 mm Hg. Peak gradient (S): 12 mm Hg. Valve area (VTI): 1.87   cm^2. Valve area (Vmax): 2.12 cm^2. Valve area (Vmean): 2 cm^2. - Left atrium: The atrium was mildly dilated.   Patient Profile     81 y.o. female with longstanding severe HTN, now s/p Whipple procedure, Cardiology consulted for HTN management.  Assessment & Plan    Currently with excellent BP, but also due to effects of fentanyl and epidural. Continue IV nicardipine for now, but will introduce oral minoxidil to wean off the IV medication. (intolerant of amlodipine and possibly hydralazine in the past).  Signed, Sanda Klein, MD  01/16/2017, 11:41 AM

## 2017-01-17 LAB — COMPREHENSIVE METABOLIC PANEL
ALT: 85 U/L — ABNORMAL HIGH (ref 14–54)
AST: 166 U/L — ABNORMAL HIGH (ref 15–41)
Albumin: 2.9 g/dL — ABNORMAL LOW (ref 3.5–5.0)
Alkaline Phosphatase: 89 U/L (ref 38–126)
Anion gap: 9 (ref 5–15)
BUN: 14 mg/dL (ref 6–20)
CO2: 27 mmol/L (ref 22–32)
Calcium: 8.2 mg/dL — ABNORMAL LOW (ref 8.9–10.3)
Chloride: 105 mmol/L (ref 101–111)
Creatinine, Ser: 0.87 mg/dL (ref 0.44–1.00)
GFR calc Af Amer: >60 mL/min (ref >60)
GFR calc non Af Amer: >60 mL/min (ref >60)
Glucose, Bld: 101 mg/dL — ABNORMAL HIGH (ref 65–99)
Potassium: 3.5 mmol/L (ref 3.5–5.1)
Sodium: 141 mmol/L (ref 135–145)
Total Bilirubin: 1.3 mg/dL — ABNORMAL HIGH (ref 0.3–1.2)
Total Protein: 6 g/dL — ABNORMAL LOW (ref 6.5–8.1)

## 2017-01-17 LAB — GLUCOSE, CAPILLARY
Glucose-Capillary: 93 mg/dL (ref 65–99)
Glucose-Capillary: 106 mg/dL — ABNORMAL HIGH (ref 65–99)
Glucose-Capillary: 125 mg/dL — ABNORMAL HIGH (ref 65–99)
Glucose-Capillary: 112 mg/dL — ABNORMAL HIGH (ref 65–99)
Glucose-Capillary: 108 mg/dL — ABNORMAL HIGH (ref 65–99)

## 2017-01-17 LAB — CBC
HCT: 31.9 % — ABNORMAL LOW (ref 36.0–46.0)
Hemoglobin: 10.2 g/dL — ABNORMAL LOW (ref 12.0–15.0)
MCH: 30.5 pg (ref 26.0–34.0)
MCHC: 32 g/dL (ref 30.0–36.0)
MCV: 95.5 fL (ref 78.0–100.0)
Platelets: 248 10*3/uL (ref 150–400)
RBC: 3.34 MIL/uL — ABNORMAL LOW (ref 3.87–5.11)
RDW: 14.4 % (ref 11.5–15.5)
WBC: 8.6 10*3/uL (ref 4.0–10.5)

## 2017-01-17 MED ORDER — ORAL CARE MOUTH RINSE
15.0000 mL | Freq: Two times a day (BID) | OROMUCOSAL | Status: DC
  Administered 2017-01-17 – 2017-01-18 (×3): 15 mL via OROMUCOSAL

## 2017-01-17 MED ORDER — CHLORHEXIDINE GLUCONATE 0.12 % MT SOLN
15.0000 mL | Freq: Two times a day (BID) | OROMUCOSAL | Status: DC
  Administered 2017-01-17 – 2017-01-22 (×8): 15 mL via OROMUCOSAL
  Filled 2017-01-17 (×5): qty 15

## 2017-01-17 NOTE — Anesthesia Post-op Follow-up Note (Signed)
  Anesthesia Pain Follow-up Note  Patient: Kaylee Mullins  Day #: 1  Date of Follow-up: 01/17/2017 Time: 7:59 PM  Last Vitals:  Vitals:   01/17/17 1800 01/17/17 1900  BP: (!) 149/51 (!) 163/57  Pulse: 64 63  Resp: 16 15  Temp:      Level of Consciousness: alert  Pain: mild   Side Effects:None  Catheter Site Exam:clean  Epidural / Intrathecal    Start     Dose/Rate Route Frequency Ordered Stop   01/14/17 1300  ropivacaine (PF) 2 mg/mL (0.2%) (NAROPIN) injection     15 mL/hr 15 mL/hr  Epidural Continuous 01/14/17 1231          Plan: Modify therapy to improve pain control at surgeon's request. Rate increased to 15. Patient doing well.  Margery Szostak

## 2017-01-17 NOTE — Progress Notes (Signed)
Progress Note  Patient Name: Kaylee Mullins Date of Encounter: 01/17/2017  Primary Cardiologist: Harrington Challenger  Subjective   Vomited a large amount earlier today.  Inpatient Medications    Scheduled Meds: . chlorhexidine  15 mL Mouth Rinse BID  . Chlorhexidine Gluconate Cloth  6 each Topical Daily  . fentaNYL   Intravenous Q4H  . furosemide  40 mg Intravenous Daily  . irbesartan  300 mg Oral Daily   And  . hydrochlorothiazide  25 mg Oral Daily  . insulin aspart  0-15 Units Subcutaneous Q4H  . insulin glargine  30 Units Subcutaneous q morning - 10a  . levothyroxine  100 mcg Intravenous Daily  . mouth rinse  15 mL Mouth Rinse q12n4p  . metoprolol succinate  100 mg Oral Daily  . minoxidil  2.5 mg Oral Daily  . pantoprazole (PROTONIX) IV  40 mg Intravenous QHS  . sodium chloride flush  10-40 mL Intracatheter Q12H  . spironolactone  25 mg Oral Daily   Continuous Infusions: . 0.9 % NaCl with KCl 20 mEq / L 25 mL/hr at 01/17/17 0700  . methocarbamol (ROBAXIN)  IV Stopped (01/14/17 0500)  . niCARDipine 4.5 mg/hr (01/17/17 0847)  . ropivacaine (PF) 2 mg/mL (0.2%) 14 mL/hr (01/17/17 0700)   PRN Meds: acetaminophen **OR** acetaminophen, albuterol, amLODipine, diphenhydrAMINE **OR** diphenhydrAMINE, fentaNYL (SUBLIMAZE) injection, methocarbamol (ROBAXIN)  IV, metoprolol tartrate, naloxone **AND** sodium chloride flush, ondansetron **OR** ondansetron (ZOFRAN) IV, sodium chloride flush   Vital Signs    Vitals:   01/17/17 0730 01/17/17 0758 01/17/17 0800 01/17/17 0920  BP: 104/70     Pulse: 71     Resp:  18 18 19   Temp: 99.2 F (37.3 C)     TempSrc: Oral     SpO2: 92% 98% 98% 100%  Weight:      Height:        Intake/Output Summary (Last 24 hours) at 01/17/17 0954 Last data filed at 01/17/17 0800  Gross per 24 hour  Intake          1956.92 ml  Output             2750 ml  Net          -793.08 ml   Filed Weights   01/12/17 1553 01/14/17 0600 01/17/17 0309  Weight: 215 lb 6.2  oz (97.7 kg) 216 lb 4.3 oz (98.1 kg) 207 lb 10.8 oz (94.2 kg)    Telemetry    NSR, frequent PACs, episode of ectopic atrial rhythm at 100 bpm for about 90 minutes earlier this AM - Personally Reviewed  ECG    NSR with PACs, LVH, minor repol changes - Personally Reviewed  Physical Exam  Sleepy GEN: No acute distress.   Neck: No JVD Cardiac: RRR, no murmurs, rubs, or gallops.  Respiratory: Clear to auscultation bilaterally. GI: Soft, nontender, non-distended  MS: No edema; No deformity. Neuro:  Nonfocal  Psych: Normal affect   Labs    Chemistry Recent Labs Lab 01/15/17 0248 01/16/17 0458 01/17/17 0353  NA 139 141 141  K 3.7 3.6 3.5  CL 107 104 105  CO2 24 29 27   GLUCOSE 232* 126* 101*  BUN 10 10 14   CREATININE 0.97 0.81 0.87  CALCIUM 8.2* 8.5* 8.2*  PROT 6.5 6.3* 6.0*  ALBUMIN 4.3 3.3* 2.9*  AST 26 31 166*  ALT 12* 16 85*  ALKPHOS 40 56 89  BILITOT 1.0 1.9* 1.3*  GFRNONAA 55* >60 >60  GFRAA >60 >60 >60  ANIONGAP 8 8 9      Hematology Recent Labs Lab 01/15/17 0248 01/15/17 1415 01/16/17 0458 01/17/17 0353  WBC 10.8*  --  10.6* 8.6  RBC 2.13*  --  3.16* 3.34*  HGB 6.8* 9.4* 9.8* 10.2*  HCT 20.8* 28.8* 29.9* 31.9*  MCV 97.7  --  94.6 95.5  MCH 31.9  --  31.0 30.5  MCHC 32.7  --  32.8 32.0  RDW 13.6  --  15.1 14.4  PLT 153  --  188 248     Radiology    Dg Chest Port 1 View  Result Date: 01/15/2017 CLINICAL DATA:  Increased shortness of breath. Epigastric pain this morning. EXAM: PORTABLE CHEST 1 VIEW COMPARISON:  01/12/2017 FINDINGS: Right central line in good position. Small wire overlies the medial aspect of the left hemithorax of unknown etiology. New atelectasis at the right lung base medially. Persistent slight atelectasis the left base noted. Chronic cardiomegaly.  Pulmonary vascularity is normal. IMPRESSION: 1. New slight atelectasis at the right lung base medially. 2. Persistent slight atelectasis at the left base medially. 3. Small wire  overlies the left hemithorax of unknown etiology. Electronically Signed   By: Lorriane Shire M.D.   On: 01/15/2017 11:56    Cardiac Studies   ECHO 01/30/2016 - Left ventricle: The cavity size was normal. There was mild concentric hypertrophy. Systolic function was normal. The estimated ejection fraction was in the range of 55% to 60%. Wall motion was normal; there were no regional wall motion abnormalities. Features are consistent with a pseudonormal left ventricular filling pattern, with concomitant abnormal relaxation and increased filling pressure (grade 2 diastolic dysfunction). - Aortic valve: There was trivial regurgitation. Mean gradient (S): 7 mm Hg. Peak gradient (S): 12 mm Hg. Valve area (VTI): 1.87 cm^2. Valve area (Vmax): 2.12 cm^2. Valve area (Vmean): 2 cm^2. - Left atrium: The atrium was mildly dilated.   Patient Profile     81 y.o. female with longstanding severe HTN, now s/p Whipple procedure, Cardiology consulted for HTN management.  Assessment & Plan    Continues to have excellent BP control on IV nicardipine. Unable to take PO meds due to vomiting. Still on epidural and fentanyl, which are probably helping control the BP.  Signed, Sanda Klein, MD  01/17/2017, 9:54 AM

## 2017-01-17 NOTE — Progress Notes (Signed)
Fentanyl PCA syringe changed.  69ml fentanyl remaining in syring wasted in sink as witnessed by Jocelyn Lamer.

## 2017-01-17 NOTE — Progress Notes (Signed)
Dr. Nyoka Cowden (on call for anesthesia) called for increased pain to left upper abdomen.  Order received to increase the ropivacaine to 15 ml/hr via epidural.  Dr. Nyoka Cowden to come by and see pt.  Will continue to monitor closely.

## 2017-01-17 NOTE — Progress Notes (Signed)
Progress Note: General Surgery Service   Assessment/Plan: Patient Active Problem List   Diagnosis Date Noted  . Adenocarcinoma ([place]) 01/12/2017  . 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 Essential hypertension 02/11/2016  . Lumbar stenosis with neurogenic claudication 02/10/2016  . Hyperlipidemia 02/10/2016  . Hypothyroidism 02/10/2016  . Musculoskeletal neck pain 12/13/2014  . Muscular pain 12/13/2014  . Numbness   . Acute CVA (cerebrovascular accident) ([place]) 09/16/2014  . Right renal mass 09/13/2014  . Cerebral infarction due to unspecified mechanism   . CVA (cerebral infarction) 09/11/2014  . Osteoarthritis 09/11/2014  . Postsurgical hypothyroidism 09/11/2014  . Uncontrolled hypertension 09/11/2014  . History of CVA (cerebral vascular accident) ([place]) 09/11/2014  . Diabetes mellitus without complication ([place]) 41/96/2229  . Dyslipidemia 04/20/2007   s/p Procedure(s): LAPAROSCOPY DIAGNOSTIC WHIPPLE PROCEDURE 01/12/2017 -continue NPO -call anesthesia to possibly change epidral   LOS: 5 days  Chief Complaint/Subjective: Vomited this morning large volume, now feels better, previously had large pain in LUQ now better  Objective: Vital signs in last 24 hours: Temp:  [98.7 F (37.1 C)-99.7 F (37.6 C)] 99.2 F (37.3 C) (06/03 0730) Pulse Rate:  [61-114] 71 (06/03 0730) Resp:  [14-35] 18 (06/03 0800) BP: (104-164)/(28-95) 104/70 (06/03 0730) SpO2:  [88 %-100 %] 98 % (06/03 0800) Weight:  [94.2 kg (207 lb 10.8 oz)] 94.2 kg (207 lb 10.8 oz) (06/03 0309) Last BM Date: 01/11/17  Intake/Output from previous day: 06/02 0701 - 06/03 0700 In: 2040.9 [I.V.:1704.9] Out: 2750 [Urine:2150; Drains:600] Intake/Output this shift: Total I/O In: 84 [I.V.:70; Other:14] Out: -   Lungs: CTAb  Cardiovascular: RRR  Abd: soft, incisions c/d/i no erythema, NT  Extremities: no edema  Neuro: AOx4  Lab Results: CBC   Recent Labs  01/16/17 0458 01/17/17 0353  WBC 10.6* 8.6  HGB  9.8* 10.2*  HCT 29.9* 31.9*  PLT 188 248   BMET  Recent Labs  01/16/17 0458 01/17/17 0353  NA 141 141  K 3.6 3.5  CL 104 105  CO2 29 27  GLUCOSE 126* 101*  BUN 10 14  CREATININE 0.81 0.87  CALCIUM 8.5* 8.2*   PT/INR No results for input(s): LABPROT, INR in the last 72 hours. ABG No results for input(s): PHART, HCO3 in the last 72 hours.  Invalid input(s): PCO2, PO2  Studies/Results:  Anti-infectives: Anti-infectives    Start     Dose/Rate Route Frequency Ordered Stop   01/12/17 1530  ceFAZolin (ANCEF) IVPB 2g/100 mL premix     2 g 200 mL/hr over 30 Minutes Intravenous Every 8 hours 01/12/17 1521 01/12/17 2312   01/12/17 0736  ceFAZolin (ANCEF) 2-4 GM/100ML-% IVPB    Comments:  [name]   : cabinet override      01/12/17 0736 01/12/17 0801   01/12/17 0622  ceFAZolin (ANCEF) IVPB 2g/100 mL premix     2 g 200 mL/hr over 30 Minutes Intravenous On call to O.R. 01/12/17 0622 01/12/17 1158      Medications: Scheduled Meds: . chlorhexidine  15 mL Mouth Rinse BID  . Chlorhexidine Gluconate Cloth  6 each Topical Daily  . fentaNYL   Intravenous Q4H  . furosemide  40 mg Intravenous Daily  . irbesartan  300 mg Oral Daily   And  . hydrochlorothiazide  25 mg Oral Daily  . insulin aspart  0-15 Units Subcutaneous Q4H  . insulin glargine  30 Units Subcutaneous q morning - 10a  . levothyroxine  100 mcg Intravenous Daily  . mouth rinse  15 mL  Mouth Rinse q12n4p  . metoprolol succinate  100 mg Oral Daily  . minoxidil  2.5 mg Oral Daily  . pantoprazole (PROTONIX) IV  40 mg Intravenous QHS  . sodium chloride flush  10-40 mL Intracatheter Q12H  . spironolactone  25 mg Oral Daily   Continuous Infusions: . 0.9 % NaCl with KCl 20 mEq / L 25 mL/hr at 01/17/17 0700  . methocarbamol (ROBAXIN)  IV Stopped (01/14/17 0500)  . niCARDipine 4.5 mg/hr (01/17/17 0847)  . ropivacaine (PF) 2 mg/mL (0.2%) 14 mL/hr (01/17/17 0700)   PRN Meds:.acetaminophen **OR** acetaminophen,  albuterol, amLODipine, diphenhydrAMINE **OR** diphenhydrAMINE, fentaNYL (SUBLIMAZE) injection, methocarbamol (ROBAXIN)  IV, metoprolol tartrate, naloxone **AND** sodium chloride flush, ondansetron **OR** ondansetron (ZOFRAN) IV, sodium chloride flush  Mickeal Skinner, MD Pg# [phone] [facility]

## 2017-01-17 NOTE — Progress Notes (Signed)
Dr. Kieth Brightly paged re:  Emesis bile colored 326ml.  OR nurse returned page with no new orders.  Will continue to closely monitor.

## 2017-01-18 LAB — GLUCOSE, CAPILLARY
Glucose-Capillary: 100 mg/dL — ABNORMAL HIGH (ref 65–99)
Glucose-Capillary: 101 mg/dL — ABNORMAL HIGH (ref 65–99)
Glucose-Capillary: 107 mg/dL — ABNORMAL HIGH (ref 65–99)
Glucose-Capillary: 112 mg/dL — ABNORMAL HIGH (ref 65–99)
Glucose-Capillary: 126 mg/dL — ABNORMAL HIGH (ref 65–99)
Glucose-Capillary: 88 mg/dL (ref 65–99)
Glucose-Capillary: 91 mg/dL (ref 65–99)

## 2017-01-18 LAB — PROTIME-INR
INR: 1.31
Prothrombin Time: 16.4 seconds — ABNORMAL HIGH (ref 11.4–15.2)

## 2017-01-18 MED ORDER — METOCLOPRAMIDE HCL 5 MG/ML IJ SOLN
2.5000 mg | Freq: Four times a day (QID) | INTRAMUSCULAR | Status: AC
  Administered 2017-01-18 – 2017-01-21 (×12): 2.5 mg via INTRAVENOUS
  Filled 2017-01-18 (×12): qty 2

## 2017-01-18 MED ORDER — METOPROLOL TARTRATE 5 MG/5ML IV SOLN
7.5000 mg | INTRAVENOUS | Status: DC
  Administered 2017-01-18 – 2017-01-20 (×11): 7.5 mg via INTRAVENOUS
  Filled 2017-01-18 (×11): qty 10

## 2017-01-18 NOTE — Anesthesia Post-op Follow-up Note (Signed)
  Anesthesia Pain Follow-up Note  Patient: [name]  Day #: 6  Date of Follow-up: 01/18/2017 Time: 6:21 PM  Last Vitals:  Vitals:   01/18/17 1737 01/18/17 1800  BP: (!) 141/47 (!) 150/49  Pulse: (!) 55 (!) 54  Resp: 13 13  Temp:      Level of Consciousness: alert  Pain: moderate   Side Effects:None  Catheter Site Exam:clean, dry  Epidural / Intrathecal    Start     Dose/Rate Route Frequency Ordered Stop   01/14/17 1300  ropivacaine (PF) 2 mg/mL (0.2%) (NAROPIN) injection     15 mL/hr 15 mL/hr  Epidural Continuous 01/14/17 1231         Plan: D/C Infusion at surgeon's request  Peace Noyes

## 2017-01-18 NOTE — Progress Notes (Signed)
Progress Note  Patient Name: Kaylee Mullins Date of Encounter: 01/18/2017  Primary Cardiologist:   Dr. Harrington Challenger  Subjective   Nausea and abdominal pain.  No SOB.   Inpatient Medications    Scheduled Meds: . chlorhexidine  15 mL Mouth Rinse BID  . chlorhexidine  15 mL Mouth Rinse BID  . Chlorhexidine Gluconate Cloth  6 each Topical Daily  . fentaNYL   Intravenous Q4H  . furosemide  40 mg Intravenous Daily  . irbesartan  300 mg Oral Daily   And  . hydrochlorothiazide  25 mg Oral Daily  . insulin aspart  0-15 Units Subcutaneous Q4H  . insulin glargine  30 Units Subcutaneous q morning - 10a  . levothyroxine  100 mcg Intravenous Daily  . mouth rinse  15 mL Mouth Rinse q12n4p  . mouth rinse  15 mL Mouth Rinse q12n4p  . metoprolol succinate  100 mg Oral Daily  . minoxidil  2.5 mg Oral Daily  . pantoprazole (PROTONIX) IV  40 mg Intravenous QHS  . sodium chloride flush  10-40 mL Intracatheter Q12H  . spironolactone  25 mg Oral Daily   Continuous Infusions: . 0.9 % NaCl with KCl 20 mEq / L 25 mL/hr at 01/18/17 1125  . methocarbamol (ROBAXIN)  IV Stopped (01/14/17 0500)  . niCARDipine 5 mg/hr (01/18/17 1109)  . ropivacaine (PF) 2 mg/mL (0.2%) 15 mL/hr (01/18/17 1100)   PRN Meds: acetaminophen **OR** acetaminophen, albuterol, amLODipine, diphenhydrAMINE **OR** diphenhydrAMINE, fentaNYL (SUBLIMAZE) injection, methocarbamol (ROBAXIN)  IV, metoprolol tartrate, naloxone **AND** sodium chloride flush, ondansetron **OR** ondansetron (ZOFRAN) IV, sodium chloride flush   Vital Signs    Vitals:   01/18/17 0930 01/18/17 1000 01/18/17 1030 01/18/17 1100  BP: (!) 139/48 (!) 150/50 (!) 150/51 (!) 156/54  Pulse: 65 63 64 67  Resp: 17 15 15 16   Temp:      TempSrc:      SpO2: 98% 98% 97% 98%  Weight:      Height:        Intake/Output Summary (Last 24 hours) at 01/18/17 1126 Last data filed at 01/18/17 1100  Gross per 24 hour  Intake             2020 ml  Output             2830 ml  Net              -810 ml   Filed Weights   01/14/17 0600 01/17/17 0309 01/18/17 0400  Weight: 216 lb 4.3 oz (98.1 kg) 207 lb 10.8 oz (94.2 kg) 207 lb 3.7 oz (94 kg)    Telemetry    NSR with run of atrial tachycardia - Personally Reviewed  ECG    NA - Personally Reviewed  Physical Exam   GEN:   She is uncomfortable with abdominal pain.  Neck: No  JVD Cardiac: RRR, no murmurs, rubs, or gallops.  Respiratory: Clear  to auscultation bilaterally. GI:  Distended with absent bowel sounds MS: No  edema; No deformity. Neuro:  Nonfocal  Psych: Normal affect   Labs    Chemistry Recent Labs Lab 01/15/17 0248 01/16/17 0458 01/17/17 0353  NA 139 141 141  K 3.7 3.6 3.5  CL 107 104 105  CO2 24 29 27   GLUCOSE 232* 126* 101*  BUN 10 10 14   CREATININE 0.97 0.81 0.87  CALCIUM 8.2* 8.5* 8.2*  PROT 6.5 6.3* 6.0*  ALBUMIN 4.3 3.3* 2.9*  AST 26 31 166*  ALT 12*  16 85*  ALKPHOS 40 56 89  BILITOT 1.0 1.9* 1.3*  GFRNONAA 55* >60 >60  GFRAA >60 >60 >60  ANIONGAP 8 8 9      Hematology Recent Labs Lab 01/15/17 0248 01/15/17 1415 01/16/17 0458 01/17/17 0353  WBC 10.8*  --  10.6* 8.6  RBC 2.13*  --  3.16* 3.34*  HGB 6.8* 9.4* 9.8* 10.2*  HCT 20.8* 28.8* 29.9* 31.9*  MCV 97.7  --  94.6 95.5  MCH 31.9  --  31.0 30.5  MCHC 32.7  --  32.8 32.0  RDW 13.6  --  15.1 14.4  PLT 153  --  188 248    Cardiac EnzymesNo results for input(s): TROPONINI in the last 168 hours. No results for input(s): TROPIPOC in the last 168 hours.   BNPNo results for input(s): BNP, PROBNP in the last 168 hours.   DDimer No results for input(s): DDIMER in the last 168 hours.   Radiology    No results found.  Cardiac Studies   ECHO 01/30/2016 - Left ventricle: The cavity size was normal. There was mild concentric hypertrophy. Systolic function was normal. The estimated ejection fraction was in the range of 55% to 60%. Wall motion was normal; there were no regional wall motion abnormalities.  Features are consistent with a pseudonormal left ventricular filling pattern, with concomitant abnormal relaxation and increased filling pressure (grade 2 diastolic dysfunction). - Aortic valve: There was trivial regurgitation. Mean gradient (S): 7 mm Hg. Peak gradient (S): 12 mm Hg. Valve area (VTI): 1.87 cm^2. Valve area (Vmax): 2.12 cm^2. Valve area (Vmean): 2 cm^2. - Left atrium: The atrium was mildly dilated  Patient Profile     81 y.o. female with longstanding severe HTN, now s/p Whipple procedure, Cardiology consulted for HTN management.  Assessment & Plan    HTN:  BP is controlled on IV Nicardipine.  She is not taking POs.  Continue current therapy.     Signed, Minus Breeding, MD  01/18/2017, 11:26 AM

## 2017-01-18 NOTE — Progress Notes (Signed)
Progress Note: General Surgery Service   Assessment/Plan: Patient Active Problem List   Diagnosis Date Noted  . Adenocarcinoma ([place]) 01/12/2017  . 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 Essential hypertension 02/11/2016  . Lumbar stenosis with neurogenic claudication 02/10/2016  . Hyperlipidemia 02/10/2016  . Hypothyroidism 02/10/2016  . Musculoskeletal neck pain 12/13/2014  . Muscular pain 12/13/2014  . Numbness   . Acute CVA (cerebrovascular accident) ([place]) 09/16/2014  . Right renal mass 09/13/2014  . Cerebral infarction due to unspecified mechanism   . CVA (cerebral infarction) 09/11/2014  . Osteoarthritis 09/11/2014  . Postsurgical hypothyroidism 09/11/2014  . Uncontrolled hypertension 09/11/2014  . History of CVA (cerebral vascular accident) ([place]) 09/11/2014  . Diabetes mellitus without complication ([place]) 45/40/9811  . Dyslipidemia 04/20/2007   s/p Procedure(s): LAPAROSCOPY DIAGNOSTIC WHIPPLE PROCEDURE 01/12/2017    LOS: 6 days  Chief Complaint/Subjective: Had nausea last night.    Objective: Vital signs in last 24 hours: Temp:  [98.5 F (36.9 C)-99 F (37.2 C)] 99 F (37.2 C) (06/04 0400) Pulse Rate:  [60-123] 67 (06/04 0800) Resp:  [13-35] 16 (06/04 0800) BP: (128-171)/(45-107) 138/48 (06/04 0800) SpO2:  [92 %-100 %] 99 % (06/04 0800) Weight:  [94 kg (207 lb 3.7 oz)] 94 kg (207 lb 3.7 oz) (06/04 0400) Last BM Date: 01/11/17  Intake/Output from previous day: 06/03 0701 - 06/04 0700 In: 2016 [I.V.:1680] Out: 2450 [Urine:1590; Emesis/NG output:650; Drains:210] Intake/Output this shift: Total I/O In: 85 [I.V.:70; Other:15] Out: 30 [Urine:30]  Lungs: breathing comfortably  Cardiovascular: RRR  Abd: soft, incisions c/d/i no erythema, NT  Extremities: no edema  Neuro: AOx4  Lab Results: CBC   Recent Labs  01/16/17 0458 01/17/17 0353  WBC 10.6* 8.6  HGB 9.8* 10.2*  HCT 29.9* 31.9*  PLT 188 248   BMET  Recent Labs  01/16/17 0458  01/17/17 0353  NA 141 141  K 3.6 3.5  CL 104 105  CO2 29 27  GLUCOSE 126* 101*  BUN 10 14  CREATININE 0.81 0.87  CALCIUM 8.5* 8.2*   PT/INR No results for input(s): LABPROT, INR in the last 72 hours. ABG No results for input(s): PHART, HCO3 in the last 72 hours.  Invalid input(s): PCO2, PO2  Studies/Results:  Anti-infectives: Anti-infectives    Start     Dose/Rate Route Frequency Ordered Stop   01/12/17 1530  ceFAZolin (ANCEF) IVPB 2g/100 mL premix     2 g 200 mL/hr over 30 Minutes Intravenous Every 8 hours 01/12/17 1521 01/12/17 2312   01/12/17 0736  ceFAZolin (ANCEF) 2-4 GM/100ML-% IVPB    Comments:  [name]   : cabinet override      01/12/17 0736 01/12/17 0801   01/12/17 0622  ceFAZolin (ANCEF) IVPB 2g/100 mL premix     2 g 200 mL/hr over 30 Minutes Intravenous On call to O.R. 01/12/17 0622 01/12/17 1158      Medications: Scheduled Meds: . chlorhexidine  15 mL Mouth Rinse BID  . chlorhexidine  15 mL Mouth Rinse BID  . Chlorhexidine Gluconate Cloth  6 each Topical Daily  . fentaNYL   Intravenous Q4H  . furosemide  40 mg Intravenous Daily  . irbesartan  300 mg Oral Daily   And  . hydrochlorothiazide  25 mg Oral Daily  . insulin aspart  0-15 Units Subcutaneous Q4H  . insulin glargine  30 Units Subcutaneous q morning - 10a  . levothyroxine  100 mcg Intravenous Daily  . mouth rinse  15 mL Mouth Rinse q12n4p  .  mouth rinse  15 mL Mouth Rinse q12n4p  . metoprolol succinate  100 mg Oral Daily  . minoxidil  2.5 mg Oral Daily  . pantoprazole (PROTONIX) IV  40 mg Intravenous QHS  . sodium chloride flush  10-40 mL Intracatheter Q12H  . spironolactone  25 mg Oral Daily   Continuous Infusions: . 0.9 % NaCl with KCl 20 mEq / L 25 mL/hr at 01/18/17 0800  . methocarbamol (ROBAXIN)  IV Stopped (01/14/17 0500)  . niCARDipine 4.5 mg/hr (01/18/17 0808)  . ropivacaine (PF) 2 mg/mL (0.2%) 15 mL/hr (01/18/17 0800)   PRN Meds:.acetaminophen **OR** acetaminophen,  albuterol, amLODipine, diphenhydrAMINE **OR** diphenhydrAMINE, fentaNYL (SUBLIMAZE) injection, methocarbamol (ROBAXIN)  IV, metoprolol tartrate, naloxone **AND** sodium chloride flush, ondansetron **OR** ondansetron (ZOFRAN) IV, sodium chloride flush  Francesco Provencal, MD  [facility]

## 2017-01-19 LAB — BPAM RBC
Blood Product Expiration Date: 201806172359
Blood Product Expiration Date: 201806172359
Blood Product Expiration Date: 201806172359
Blood Product Expiration Date: 201806202359
Blood Product Expiration Date: 201806212359
Blood Product Expiration Date: 201806212359
ISSUE DATE / TIME: 201805291030
ISSUE DATE / TIME: 201805291030
ISSUE DATE / TIME: 201806010542
ISSUE DATE / TIME: 201806010839
Unit Type and Rh: 7300
Unit Type and Rh: 7300
Unit Type and Rh: 7300
Unit Type and Rh: 7300
Unit Type and Rh: 7300
Unit Type and Rh: 7300

## 2017-01-19 LAB — TYPE AND SCREEN
ABO/RH(D): B POS
Antibody Screen: NEGATIVE
Unit division: 0
Unit division: 0
Unit division: 0
Unit division: 0
Unit division: 0
Unit division: 0

## 2017-01-19 LAB — GLUCOSE, CAPILLARY
Glucose-Capillary: 109 mg/dL — ABNORMAL HIGH (ref 65–99)
Glucose-Capillary: 117 mg/dL — ABNORMAL HIGH (ref 65–99)
Glucose-Capillary: 94 mg/dL (ref 65–99)
Glucose-Capillary: 95 mg/dL (ref 65–99)
Glucose-Capillary: 98 mg/dL (ref 65–99)

## 2017-01-19 NOTE — Progress Notes (Signed)
Progress Note  Patient Name: Kaylee Mullins Date of Encounter: 01/19/2017  Primary Cardiologist:   Dr. Harrington Challenger  Subjective   She feels much better today.  Nausea is improved.  Breathing OK.  Inpatient Medications    Scheduled Meds: . chlorhexidine  15 mL Mouth Rinse BID  . chlorhexidine  15 mL Mouth Rinse BID  . Chlorhexidine Gluconate Cloth  6 each Topical Daily  . fentaNYL   Intravenous Q4H  . furosemide  40 mg Intravenous Daily  . irbesartan  300 mg Oral Daily   And  . hydrochlorothiazide  25 mg Oral Daily  . insulin aspart  0-15 Units Subcutaneous Q4H  . insulin glargine  30 Units Subcutaneous q morning - 10a  . levothyroxine  100 mcg Intravenous Daily  . mouth rinse  15 mL Mouth Rinse q12n4p  . mouth rinse  15 mL Mouth Rinse q12n4p  . metoCLOPramide (REGLAN) injection  2.5 mg Intravenous Q6H  . metoprolol tartrate  7.5 mg Intravenous Q4H  . minoxidil  2.5 mg Oral Daily  . pantoprazole (PROTONIX) IV  40 mg Intravenous QHS  . sodium chloride flush  10-40 mL Intracatheter Q12H  . spironolactone  25 mg Oral Daily   Continuous Infusions: . 0.9 % NaCl with KCl 20 mEq / L 25 mL/hr at 01/19/17 0800  . methocarbamol (ROBAXIN)  IV Stopped (01/14/17 0500)  . niCARDipine 5 mg/hr (01/19/17 0800)  . ropivacaine (PF) 2 mg/mL (0.2%) Stopped (01/18/17 1812)   PRN Meds: acetaminophen **OR** acetaminophen, albuterol, amLODipine, diphenhydrAMINE **OR** diphenhydrAMINE, fentaNYL (SUBLIMAZE) injection, methocarbamol (ROBAXIN)  IV, naloxone **AND** sodium chloride flush, ondansetron **OR** ondansetron (ZOFRAN) IV, sodium chloride flush   Vital Signs    Vitals:   01/19/17 0500 01/19/17 0600 01/19/17 0700 01/19/17 0800  BP: (!) 193/64 (!) 175/53 (!) 178/58 (!) 168/60  Pulse: 68 (!) 59 61 62  Resp: 19 16 20 16   Temp:      TempSrc:      SpO2: 98% 94% 96% 97%  Weight:      Height:        Intake/Output Summary (Last 24 hours) at 01/19/17 0902 Last data filed at 01/19/17 0800  Gross  per 24 hour  Intake          1463.75 ml  Output             2350 ml  Net          -886.25 ml   Filed Weights   01/17/17 0309 01/18/17 0400 01/19/17 0400  Weight: 207 lb 10.8 oz (94.2 kg) 207 lb 3.7 oz (94 kg) 205 lb 7.5 oz (93.2 kg)    Telemetry    NSR with atrial tachycarida- Personally Reviewed  ECG    NA - Personally Reviewed  Physical Exam   GEN: No  acute distress.   Neck: No  JVD Cardiac:  RRR, no murmurs, rubs, or gallops.  Respiratory: Decreased breath sounds right greater than left with basilar crackles  to auscultation bilaterally. GI: Soft, nontender, non-distended, normal bowel sounds  MS:  Trace edema; No deformity. Neuro:   Nonfocal  Psych: Oriented and appropriate    Labs    Chemistry  Recent Labs Lab 01/15/17 0248 01/16/17 0458 01/17/17 0353  NA 139 141 141  K 3.7 3.6 3.5  CL 107 104 105  CO2 24 29 27   GLUCOSE 232* 126* 101*  BUN 10 10 14   CREATININE 0.97 0.81 0.87  CALCIUM 8.2* 8.5* 8.2*  PROT 6.5  6.3* 6.0*  ALBUMIN 4.3 3.3* 2.9*  AST 26 31 166*  ALT 12* 16 85*  ALKPHOS 40 56 89  BILITOT 1.0 1.9* 1.3*  GFRNONAA 55* >60 >60  GFRAA >60 >60 >60  ANIONGAP 8 8 9      Hematology  Recent Labs Lab 01/15/17 0248 01/15/17 1415 01/16/17 0458 01/17/17 0353  WBC 10.8*  --  10.6* 8.6  RBC 2.13*  --  3.16* 3.34*  HGB 6.8* 9.4* 9.8* 10.2*  HCT 20.8* 28.8* 29.9* 31.9*  MCV 97.7  --  94.6 95.5  MCH 31.9  --  31.0 30.5  MCHC 32.7  --  32.8 32.0  RDW 13.6  --  15.1 14.4  PLT 153  --  188 248    Cardiac EnzymesNo results for input(s): TROPONINI in the last 168 hours. No results for input(s): TROPIPOC in the last 168 hours.   BNPNo results for input(s): BNP, PROBNP in the last 168 hours.   DDimer No results for input(s): DDIMER in the last 168 hours.   Radiology    No results found.  Cardiac Studies   ECHO 01/30/2016 - Left ventricle: The cavity size was normal. There was mild concentric hypertrophy. Systolic function was normal.  The estimated ejection fraction was in the range of 55% to 60%. Wall motion was normal; there were no regional wall motion abnormalities. Features are consistent with a pseudonormal left ventricular filling pattern, with concomitant abnormal relaxation and increased filling pressure (grade 2 diastolic dysfunction). - Aortic valve: There was trivial regurgitation. Mean gradient (S): 7 mm Hg. Peak gradient (S): 12 mm Hg. Valve area (VTI): 1.87 cm^2. Valve area (Vmax): 2.12 cm^2. Valve area (Vmean): 2 cm^2. - Left atrium: The atrium was mildly dilated  Patient Profile     81 y.o. female with longstanding severe HTN, now s/p Whipple procedure, Cardiology consulted for HTN management.  Assessment & Plan    HTN:  BP is elevated but now taking POs.  Goal is SBP less than 150.  She has been started back on multiple meds and we will wean the Nicardipine.  She still has IV beta blocker ordered and we will hold this if heart rate or BP low after PO meds take effect.    Signed, Minus Breeding, MD  01/19/2017, 9:02 AM

## 2017-01-19 NOTE — Progress Notes (Signed)
Progress Note: General Surgery Service   Assessment/Plan: Patient Active Problem List   Diagnosis Date Noted  . Adenocarcinoma ([place]) 01/12/2017  . 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 Essential hypertension 02/11/2016  . Lumbar stenosis with neurogenic claudication 02/10/2016  . Hyperlipidemia 02/10/2016  . Hypothyroidism 02/10/2016  . Musculoskeletal neck pain 12/13/2014  . Muscular pain 12/13/2014  . Numbness   . Acute CVA (cerebrovascular accident) ([place]) 09/16/2014  . Right renal mass 09/13/2014  . Cerebral infarction due to unspecified mechanism   . CVA (cerebral infarction) 09/11/2014  . Osteoarthritis 09/11/2014  . Postsurgical hypothyroidism 09/11/2014  . Uncontrolled hypertension 09/11/2014  . History of CVA (cerebral vascular accident) ([place]) 09/11/2014  . Diabetes mellitus without complication ([place]) 01/75/1025  . Dyslipidemia 04/20/2007   s/p Procedure(s): LAPAROSCOPY DIAGNOSTIC WHIPPLE PROCEDURE 01/12/2017  A/P Oral antihypertensives.  Try to get off nifedipine.  Can't leave ICU until off nifedipine. Clear liq diet.   Fentanyl PCA for pain control. Lantus and SSI for DM.   ABL anemia stable.   LOS: 7 days  Chief Complaint/Subjective: Feels much better after having BM.  No n/v.    Objective: Vital signs in last 24 hours: Temp:  [98 F (36.7 C)-99.3 F (37.4 C)] 99 F (37.2 C) (06/05 0342) Pulse Rate:  [54-68] 62 (06/05 0800) Resp:  [12-21] 16 (06/05 0800) BP: (125-193)/(45-123) 168/60 (06/05 0800) SpO2:  [94 %-99 %] 97 % (06/05 0800) Weight:  [93.2 kg (205 lb 7.5 oz)] 93.2 kg (205 lb 7.5 oz) (06/05 0400) Last BM Date: 01/19/17  Intake/Output from previous day: 06/04 0701 - 06/05 0700 In: 1558.8 [I.V.:1393.8] Out: 2255 [Urine:2075; Drains:180] Intake/Output this shift: Total I/O In: 75 [I.V.:75] Out: 125 [Urine:125]  Lungs: breathing comfortably  Cardiovascular: RRR  Abd: soft, incisions c/d/i no erythema, NT, drains serosang.     Extremities: no edema  Neuro: AOx4  Lab Results: CBC   Recent Labs  01/17/17 0353  WBC 8.6  HGB 10.2*  HCT 31.9*  PLT 248   BMET  Recent Labs  01/17/17 0353  NA 141  K 3.5  CL 105  CO2 27  GLUCOSE 101*  BUN 14  CREATININE 0.87  CALCIUM 8.2*   PT/INR  Recent Labs  01/18/17 1707  LABPROT 16.4*  INR 1.31   ABG No results for input(s): PHART, HCO3 in the last 72 hours.  Invalid input(s): PCO2, PO2  Studies/Results:  Anti-infectives: Anti-infectives    Start     Dose/Rate Route Frequency Ordered Stop   01/12/17 1530  ceFAZolin (ANCEF) IVPB 2g/100 mL premix     2 g 200 mL/hr over 30 Minutes Intravenous Every 8 hours 01/12/17 1521 01/12/17 2312   01/12/17 0736  ceFAZolin (ANCEF) 2-4 GM/100ML-% IVPB    Comments:  [name]   : cabinet override      01/12/17 0736 01/12/17 0801   01/12/17 0622  ceFAZolin (ANCEF) IVPB 2g/100 mL premix     2 g 200 mL/hr over 30 Minutes Intravenous On call to O.R. 01/12/17 0622 01/12/17 1158      Medications: Scheduled Meds: . chlorhexidine  15 mL Mouth Rinse BID  . chlorhexidine  15 mL Mouth Rinse BID  . Chlorhexidine Gluconate Cloth  6 each Topical Daily  . fentaNYL   Intravenous Q4H  . furosemide  40 mg Intravenous Daily  . irbesartan  300 mg Oral Daily   And  . hydrochlorothiazide  25 mg Oral Daily  . insulin aspart  0-15 Units Subcutaneous Q4H  . insulin  glargine  30 Units Subcutaneous q morning - 10a  . levothyroxine  100 mcg Intravenous Daily  . mouth rinse  15 mL Mouth Rinse q12n4p  . mouth rinse  15 mL Mouth Rinse q12n4p  . metoCLOPramide (REGLAN) injection  2.5 mg Intravenous Q6H  . metoprolol tartrate  7.5 mg Intravenous Q4H  . minoxidil  2.5 mg Oral Daily  . pantoprazole (PROTONIX) IV  40 mg Intravenous QHS  . sodium chloride flush  10-40 mL Intracatheter Q12H  . spironolactone  25 mg Oral Daily   Continuous Infusions: . 0.9 % NaCl with KCl 20 mEq / L 25 mL/hr at 01/19/17 0800  .  methocarbamol (ROBAXIN)  IV Stopped (01/14/17 0500)  . niCARDipine 5 mg/hr (01/19/17 0800)  . ropivacaine (PF) 2 mg/mL (0.2%) Stopped (01/18/17 1812)   PRN Meds:.acetaminophen **OR** acetaminophen, albuterol, amLODipine, diphenhydrAMINE **OR** diphenhydrAMINE, fentaNYL (SUBLIMAZE) injection, methocarbamol (ROBAXIN)  IV, naloxone **AND** sodium chloride flush, ondansetron **OR** ondansetron (ZOFRAN) IV, sodium chloride flush  Denzell Colasanti, MD  [facility]

## 2017-01-19 NOTE — Care Management Note (Signed)
Case Management Note Marvetta Gibbons RN, BSN Unit 2W-Case Manager-- [place] coverage [phone]  Patient Details  Name: Kaylee Mullins MRN: [id number] Date of Birth: [birth year]  Subjective/Objective:    Pt admitted s/p whipple 5/29                Action/Plan: PTA pt lived at home- CM to follow- noted pt remains on Cardene drip on 6/5  Expected Discharge Date:                  Expected Discharge Plan:     In-House Referral:     Discharge planning Services  CM Consult  Post Acute Care Choice:    Choice offered to:     DME Arranged:    DME Agency:     HH Arranged:    [place] Agency:     Status of Service:  In process, will continue to follow  If discussed at Long Length of Stay Meetings, dates discussed:  6/5  Discharge Disposition:   Additional Comments:  Dawayne Patricia, RN 01/19/2017, 3:41 PM

## 2017-01-19 NOTE — Progress Notes (Signed)
Initial Nutrition Assessment  DOCUMENTATION CODES:   Obesity unspecified, Severe malnutrition in context of chronic illness  INTERVENTION:   Monitor diet advancement and supplement diet once advanced beyond clear liquids.    NUTRITION DIAGNOSIS:   Malnutrition (Severe) related to chronic illness (renal cell and pancreatic cancer) as evidenced by energy intake < or equal to 75% for > or equal to 1 month, percent weight loss.  GOAL:   Patient will meet greater than or equal to 90% of their needs  MONITOR:   Diet advancement, PO intake, Supplement acceptance, Weight trends  REASON FOR ASSESSMENT:   NPO/Clear Liquid Diet    ASSESSMENT:   Pt with PMH of DM, renal cell cancer and pancreatic mass s/p ERCP and biopsy postive for cancer symptoms included bloating admitted for new dx of ampullary carcinoma now s/p whipple.    Spoke with pt and her son. Pt reports that after Christmas of 2017 she noticed a decrease in her appetite and she began to lose weight. She is having trouble sleeping therefore she does not wake until 1:30 pm. Her first meal is a bowl of cereal and some juice. No lunch. She then has a small dinner meal of a meat and some salad. She lives alone and prepares her own meals. Sometimes son brings her food. She has not been consuming any oral nutrition supplement although she did receive samples when she saw the outpatient oncology RD.  She states she tried glucerna and liked it.   Pt has been having some gas pain this admission which is not new and was educated by above RD on a diet to help with this.   Her usual weight is 243 lb but is now down to 205 lb. Pt does have BLE edema present. This is not uncommon for her. Total weight loss: 38 lb or 16% of her total body weight in just over 5 months.    Pt has been NPO (7days) since admission but was started on clear liquids today. She tried everything but does not have an appetite. She feels she needs to burp.    Medications reviewed and include: lantus, novolog, synthroid, reglan, 20 mEq KCl in IVF Labs reviewed: CBG's: [phone]  Nutrition-Focused physical exam completed. Findings are mild/moderate orbital fat depletion, severe temple and mild/moderate dorsal hand muscle depletion, and mild BLE edema.   Discussed importance of adequate nutrition post op. Encouraged supplement intake at home after discharge.   Diet Order:  Diet clear liquid Room service appropriate? Yes; Fluid consistency: Thin  Skin:   (abd incision)  Last BM:  6/5  Height:   Ht Readings from Last 1 Encounters:  01/12/17 5\' 4"  (1.626 m)    Weight:   Wt Readings from Last 1 Encounters:  01/19/17 205 lb 7.5 oz (93.2 kg)    Ideal Body Weight:  54.5 kg  BMI:  Body mass index is 35.27 kg/m.  Estimated Nutritional Needs:   Kcal:  2000-2200  Protein:  110-120 grams  Fluid:  >/= 2 L/day  EDUCATION NEEDS:   Education needs addressed  Maylon Peppers RD, [place], [place] Pager [phone] After Hours Pager

## 2017-01-20 ENCOUNTER — Telehealth: Payer: Self-pay | Admitting: *Deleted

## 2017-01-20 LAB — BASIC METABOLIC PANEL
Anion gap: 8 (ref 5–15)
BUN: 13 mg/dL (ref 6–20)
CO2: 28 mmol/L (ref 22–32)
Calcium: 7.9 mg/dL — ABNORMAL LOW (ref 8.9–10.3)
Chloride: 108 mmol/L (ref 101–111)
Creatinine, Ser: 0.77 mg/dL (ref 0.44–1.00)
GFR calc Af Amer: >60 mL/min (ref >60)
GFR calc non Af Amer: >60 mL/min (ref >60)
Glucose, Bld: 138 mg/dL — ABNORMAL HIGH (ref 65–99)
Potassium: 3.1 mmol/L — ABNORMAL LOW (ref 3.5–5.1)
Sodium: 144 mmol/L (ref 135–145)

## 2017-01-20 LAB — GLUCOSE, CAPILLARY
Glucose-Capillary: 117 mg/dL — ABNORMAL HIGH (ref 65–99)
Glucose-Capillary: 126 mg/dL — ABNORMAL HIGH (ref 65–99)
Glucose-Capillary: 135 mg/dL — ABNORMAL HIGH (ref 65–99)
Glucose-Capillary: 139 mg/dL — ABNORMAL HIGH (ref 65–99)
Glucose-Capillary: 141 mg/dL — ABNORMAL HIGH (ref 65–99)
Glucose-Capillary: 189 mg/dL — ABNORMAL HIGH (ref 65–99)

## 2017-01-20 LAB — CBC
HCT: 33.3 % — ABNORMAL LOW (ref 36.0–46.0)
Hemoglobin: 10.4 g/dL — ABNORMAL LOW (ref 12.0–15.0)
MCH: 30.5 pg (ref 26.0–34.0)
MCHC: 31.2 g/dL (ref 30.0–36.0)
MCV: 97.7 fL (ref 78.0–100.0)
Platelets: 335 10*3/uL (ref 150–400)
RBC: 3.41 MIL/uL — ABNORMAL LOW (ref 3.87–5.11)
RDW: 14 % (ref 11.5–15.5)
WBC: 12.1 10*3/uL — ABNORMAL HIGH (ref 4.0–10.5)

## 2017-01-20 MED ORDER — POTASSIUM CHLORIDE CRYS ER 20 MEQ PO TBCR
40.0000 meq | EXTENDED_RELEASE_TABLET | Freq: Two times a day (BID) | ORAL | Status: DC
  Administered 2017-01-20 (×2): 40 meq via ORAL
  Filled 2017-01-20 (×2): qty 2

## 2017-01-20 MED ORDER — OXYCODONE HCL 5 MG PO TABS
5.0000 mg | ORAL_TABLET | ORAL | Status: DC | PRN
  Administered 2017-01-21 – 2017-01-23 (×2): 10 mg via ORAL
  Filled 2017-01-20 (×2): qty 2

## 2017-01-20 MED ORDER — METOPROLOL TARTRATE 50 MG PO TABS
50.0000 mg | ORAL_TABLET | Freq: Two times a day (BID) | ORAL | Status: DC
  Administered 2017-01-20 – 2017-02-05 (×29): 50 mg via ORAL
  Filled 2017-01-20 (×31): qty 1

## 2017-01-20 MED ORDER — AMLODIPINE BESYLATE 10 MG PO TABS
10.0000 mg | ORAL_TABLET | Freq: Every day | ORAL | Status: DC
  Administered 2017-01-20 – 2017-02-05 (×16): 10 mg via ORAL
  Filled 2017-01-20 (×17): qty 1

## 2017-01-20 MED ORDER — ENOXAPARIN SODIUM 40 MG/0.4ML ~~LOC~~ SOLN: 40.0000 mg | SUBCUTANEOUS | Status: DC

## 2017-01-20 NOTE — Progress Notes (Signed)
McLean MD paged regarding PRN medications with patient's systolic pressures in the 170s-180s. No new orders received. Will continue to monitor the patient closely.    Inis Sizer

## 2017-01-20 NOTE — Progress Notes (Signed)
01/20/2017 1430  Dr. Grandville Silos paged and made aware of K 3.1 this am. Orders received for KDUR 40 meq BID PO . Orders placed and enacted. Will continue to closely monitor patient.  Kaylee Mullins, [name]

## 2017-01-20 NOTE — Progress Notes (Signed)
Progress Note  Patient Name: Kaylee Mullins Date of Encounter: 01/20/2017  Primary Cardiologist:   Dr. Harrington Challenger  Subjective   Nauseated yesterday and mild abdominal pain.   Inpatient Medications    Scheduled Meds: . chlorhexidine  15 mL Mouth Rinse BID  . chlorhexidine  15 mL Mouth Rinse BID  . Chlorhexidine Gluconate Cloth  6 each Topical Daily  . fentaNYL   Intravenous Q4H  . furosemide  40 mg Intravenous Daily  . irbesartan  300 mg Oral Daily   And  . hydrochlorothiazide  25 mg Oral Daily  . insulin aspart  0-15 Units Subcutaneous Q4H  . insulin glargine  30 Units Subcutaneous q morning - 10a  . levothyroxine  100 mcg Intravenous Daily  . mouth rinse  15 mL Mouth Rinse q12n4p  . mouth rinse  15 mL Mouth Rinse q12n4p  . metoCLOPramide (REGLAN) injection  2.5 mg Intravenous Q6H  . metoprolol tartrate  7.5 mg Intravenous Q4H  . minoxidil  2.5 mg Oral Daily  . pantoprazole (PROTONIX) IV  40 mg Intravenous QHS  . sodium chloride flush  10-40 mL Intracatheter Q12H  . spironolactone  25 mg Oral Daily   Continuous Infusions: . 0.9 % NaCl with KCl 20 mEq / L 25 mL/hr at 01/20/17 0800  . methocarbamol (ROBAXIN)  IV Stopped (01/14/17 0500)  . niCARDipine 2 mg/hr (01/20/17 0800)  . ropivacaine (PF) 2 mg/mL (0.2%) Stopped (01/18/17 1812)   PRN Meds: acetaminophen **OR** acetaminophen, albuterol, amLODipine, diphenhydrAMINE **OR** diphenhydrAMINE, fentaNYL (SUBLIMAZE) injection, methocarbamol (ROBAXIN)  IV, naloxone **AND** sodium chloride flush, ondansetron **OR** ondansetron (ZOFRAN) IV, oxyCODONE, sodium chloride flush   Vital Signs    Vitals:   01/20/17 0700 01/20/17 0730 01/20/17 0800 01/20/17 0833  BP: (!) 163/57 (!) 149/48 (!) 157/52   Pulse: (!) 55 (!) 54 (!) 57   Resp: 17 19 20    Temp:    98.8 F (37.1 C)  TempSrc:    Oral  SpO2: 98% 98% 98%   Weight:      Height:        Intake/Output Summary (Last 24 hours) at 01/20/17 0843 Last data filed at 01/20/17 0800  Gross per 24 hour  Intake          1381.83 ml  Output             3110 ml  Net         -1728.17 ml   Filed Weights   01/17/17 0309 01/18/17 0400 01/19/17 0400  Weight: 207 lb 10.8 oz (94.2 kg) 207 lb 3.7 oz (94 kg) 205 lb 7.5 oz (93.2 kg)    Telemetry    NSR with atrial tachycarida- Personally Reviewed  ECG    NA - Personally Reviewed  Physical Exam   GEN: No  acute distress.   Neck: No  JVD Cardiac:  RRR, no murmurs, rubs, or gallops.  Respiratory: Clear   to auscultation bilaterally. GI: Soft, nontender, non-distended, decreased bowel sounds  MS:  No edema; No deformity. Neuro:   Nonfocal  Psych: Oriented and appropriate      Labs    Chemistry  Recent Labs Lab 01/15/17 0248 01/16/17 0458 01/17/17 0353  NA 139 141 141  K 3.7 3.6 3.5  CL 107 104 105  CO2 24 29 27   GLUCOSE 232* 126* 101*  BUN 10 10 14   CREATININE 0.97 0.81 0.87  CALCIUM 8.2* 8.5* 8.2*  PROT 6.5 6.3* 6.0*  ALBUMIN 4.3 3.3* 2.9*  AST 26 31 166*  ALT 12* 16 85*  ALKPHOS 40 56 89  BILITOT 1.0 1.9* 1.3*  GFRNONAA 55* >60 >60  GFRAA >60 >60 >60  ANIONGAP 8 8 9      Hematology  Recent Labs Lab 01/15/17 0248 01/15/17 1415 01/16/17 0458 01/17/17 0353  WBC 10.8*  --  10.6* 8.6  RBC 2.13*  --  3.16* 3.34*  HGB 6.8* 9.4* 9.8* 10.2*  HCT 20.8* 28.8* 29.9* 31.9*  MCV 97.7  --  94.6 95.5  MCH 31.9  --  31.0 30.5  MCHC 32.7  --  32.8 32.0  RDW 13.6  --  15.1 14.4  PLT 153  --  188 248    Cardiac EnzymesNo results for input(s): TROPONINI in the last 168 hours. No results for input(s): TROPIPOC in the last 168 hours.   BNPNo results for input(s): BNP, PROBNP in the last 168 hours.   DDimer No results for input(s): DDIMER in the last 168 hours.   Radiology    No results found.  Cardiac Studies   ECHO 01/30/2016 - Left ventricle: The cavity size was normal. There was mild concentric hypertrophy. Systolic function was normal. The estimated ejection fraction was in the range of  55% to 60%. Wall motion was normal; there were no regional wall motion abnormalities. Features are consistent with a pseudonormal left ventricular filling pattern, with concomitant abnormal relaxation and increased filling pressure (grade 2 diastolic dysfunction). - Aortic valve: There was trivial regurgitation. Mean gradient (S): 7 mm Hg. Peak gradient (S): 12 mm Hg. Valve area (VTI): 1.87 cm^2. Valve area (Vmax): 2.12 cm^2. Valve area (Vmean): 2 cm^2. - Left atrium: The atrium was mildly dilated  Patient Profile     81 y.o. female with longstanding severe HTN, now s/p Whipple procedure, Cardiology consulted for HTN management.  Assessment & Plan    HTN:  She was started back on multiple PO meds yesterday and had IV beta blocker q4.  I will increase the Norvasc and change to daily.  Change metoprolol to XL 100 mg.    Signed, Minus Breeding, MD  01/20/2017, 8:43 AM

## 2017-01-20 NOTE — Progress Notes (Signed)
01/20/2017 1200 Nursing Note In speaking with patient regarding plan of care, RN asked patient if nursing staff could assist her to the chair as MD wanted to increase patient mobility and ordered PT. Patient declined nursing assistance stating she was afraid to get up with nursing staff due to recent fall. Patient requested to wait until PT was available to get her OOB. Physical Therapy department contacted and made aware of need to see patient today if possible. Will continue to closely monitor patient.  Delcia Spitzley, [name]

## 2017-01-20 NOTE — Progress Notes (Addendum)
8 Days Post-Op   Subjective/Chief Complaint: CC BM yesterday am, tolerating some cears but feels full   Objective: Vital signs in last 24 hours: Temp:  [98 F (36.7 C)-101.2 F (38.4 C)] 99.3 F (37.4 C) (06/06 0600) Pulse Rate:  [54-73] 63 (06/06 0600) Resp:  [11-27] 18 (06/06 0600) BP: (106-188)/(48-90) 167/61 (06/06 0600) SpO2:  [34 %-99 %] 95 % (06/06 0600) Last BM Date: 01/19/17  Intake/Output from previous day: 06/05 0701 - 06/06 0700 In: 1411.8 [I.V.:1411.8] Out: 3175 [Urine:2985; Drains:190] Intake/Output this shift: No intake/output data recorded.  General appearance: alert and cooperative Resp: clear to auscultation bilaterally Cardio: regular rate and rhythm GI: soft, incision CDI, +BS, drains + bile tinge Neurologic: Mental status: Alert, oriented, thought content appropriate  Lab Results:  No results for input(s): WBC, HGB, HCT, PLT in the last 72 hours. BMET No results for input(s): NA, K, CL, CO2, GLUCOSE, BUN, CREATININE, CALCIUM in the last 72 hours. PT/INR  Recent Labs  01/18/17 1707  LABPROT 16.4*  INR 1.31   ABG No results for input(s): PHART, HCO3 in the last 72 hours.  Invalid input(s): PCO2, PO2  Studies/Results: No results found.  Anti-infectives: Anti-infectives    Start     Dose/Rate Route Frequency Ordered Stop   01/12/17 1530  ceFAZolin (ANCEF) IVPB 2g/100 mL premix     2 g 200 mL/hr over 30 Minutes Intravenous Every 8 hours 01/12/17 1521 01/12/17 2312   01/12/17 0736  ceFAZolin (ANCEF) 2-4 GM/100ML-% IVPB    Comments:  [name]   : cabinet override      01/12/17 0736 01/12/17 0801   01/12/17 0622  ceFAZolin (ANCEF) IVPB 2g/100 mL premix     2 g 200 mL/hr over 30 Minutes Intravenous On call to O.R. 01/12/17 0622 01/12/17 1158      Assessment/Plan: s/p Procedure(s): LAPAROSCOPY DIAGNOSTIC (N/A) WHIPPLE PROCEDURE (N/A) 01/13/27 Kaylee Mullins) Clears for now Deconditioning - PT/OT HTN - weaning nicardipine, appreciate  cardiology F/U VTE - start Lovenox if Hb and PLTs OK Labs are P now  LOS: 8 days    Kaylee Mullins 01/20/2017

## 2017-01-20 NOTE — Telephone Encounter (Signed)
Message received from patient to inform Dr. Benay Spice that she is still currently in ICU at [facility].  Will notify Dr. Benay Spice of patient's phone call.

## 2017-01-21 LAB — CBC
HCT: 34.4 % — ABNORMAL LOW (ref 36.0–46.0)
Hemoglobin: 10.7 g/dL — ABNORMAL LOW (ref 12.0–15.0)
MCH: 30.7 pg (ref 26.0–34.0)
MCHC: 31.1 g/dL (ref 30.0–36.0)
MCV: 98.6 fL (ref 78.0–100.0)
Platelets: 360 10*3/uL (ref 150–400)
RBC: 3.49 MIL/uL — ABNORMAL LOW (ref 3.87–5.11)
RDW: 14.2 % (ref 11.5–15.5)
WBC: 12.4 10*3/uL — ABNORMAL HIGH (ref 4.0–10.5)

## 2017-01-21 LAB — GLUCOSE, CAPILLARY
Glucose-Capillary: 113 mg/dL — ABNORMAL HIGH (ref 65–99)
Glucose-Capillary: 147 mg/dL — ABNORMAL HIGH (ref 65–99)
Glucose-Capillary: 151 mg/dL — ABNORMAL HIGH (ref 65–99)
Glucose-Capillary: 106 mg/dL — ABNORMAL HIGH (ref 65–99)
Glucose-Capillary: 109 mg/dL — ABNORMAL HIGH (ref 65–99)

## 2017-01-21 LAB — BASIC METABOLIC PANEL
Anion gap: 10 (ref 5–15)
BUN: 16 mg/dL (ref 6–20)
CO2: 27 mmol/L (ref 22–32)
Calcium: 8.1 mg/dL — ABNORMAL LOW (ref 8.9–10.3)
Chloride: 105 mmol/L (ref 101–111)
Creatinine, Ser: 1.04 mg/dL — ABNORMAL HIGH (ref 0.44–1.00)
GFR calc Af Amer: 58 mL/min — ABNORMAL LOW (ref >60)
GFR calc non Af Amer: 50 mL/min — ABNORMAL LOW (ref >60)
Glucose, Bld: 125 mg/dL — ABNORMAL HIGH (ref 65–99)
Potassium: 3.5 mmol/L (ref 3.5–5.1)
Sodium: 142 mmol/L (ref 135–145)

## 2017-01-21 MED ORDER — LEVOTHYROXINE SODIUM 100 MCG PO TABS
200.0000 ug | ORAL_TABLET | Freq: Every day | ORAL | Status: DC
  Administered 2017-01-21 – 2017-02-05 (×16): 200 ug via ORAL
  Filled 2017-01-21 (×16): qty 2

## 2017-01-21 MED ORDER — FUROSEMIDE 40 MG PO TABS
40.0000 mg | ORAL_TABLET | Freq: Every day | ORAL | Status: DC
  Administered 2017-01-21 – 2017-02-05 (×14): 40 mg via ORAL
  Filled 2017-01-21 (×15): qty 1

## 2017-01-21 MED ORDER — SPIRONOLACTONE 50 MG PO TABS
50.0000 mg | ORAL_TABLET | Freq: Every day | ORAL | Status: DC
  Administered 2017-01-21 – 2017-02-05 (×14): 50 mg via ORAL
  Filled 2017-01-21 (×3): qty 1
  Filled 2017-01-21 (×4): qty 2
  Filled 2017-01-21: qty 1
  Filled 2017-01-21 (×3): qty 2
  Filled 2017-01-21 (×2): qty 1
  Filled 2017-01-21: qty 2
  Filled 2017-01-21: qty 1

## 2017-01-21 MED ORDER — POTASSIUM CHLORIDE CRYS ER 20 MEQ PO TBCR
20.0000 meq | EXTENDED_RELEASE_TABLET | Freq: Two times a day (BID) | ORAL | Status: DC
  Administered 2017-01-21 – 2017-01-29 (×15): 20 meq via ORAL
  Filled 2017-01-21 (×19): qty 1

## 2017-01-21 MED ORDER — ENOXAPARIN SODIUM 40 MG/0.4ML ~~LOC~~ SOLN
40.0000 mg | SUBCUTANEOUS | Status: DC
  Administered 2017-01-21 – 2017-02-01 (×12): 40 mg via SUBCUTANEOUS
  Filled 2017-01-21 (×12): qty 0.4

## 2017-01-21 NOTE — Progress Notes (Signed)
Progress Note  Patient Name: Kaylee Mullins Date of Encounter: 01/21/2017  Primary Cardiologist:   Dr. Harrington Challenger  Subjective   She is a little bit nauseated this AM. However, improved compared to yesterday.    Inpatient Medications    Scheduled Meds: . amLODipine  10 mg Oral Daily  . chlorhexidine  15 mL Mouth Rinse BID  . Chlorhexidine Gluconate Cloth  6 each Topical Daily  . fentaNYL   Intravenous Q4H  . furosemide  40 mg Intravenous Daily  . irbesartan  300 mg Oral Daily   And  . hydrochlorothiazide  25 mg Oral Daily  . insulin aspart  0-15 Units Subcutaneous Q4H  . insulin glargine  30 Units Subcutaneous q morning - 10a  . levothyroxine  100 mcg Intravenous Daily  . mouth rinse  15 mL Mouth Rinse q12n4p  . metoprolol tartrate  50 mg Oral BID  . minoxidil  2.5 mg Oral Daily  . pantoprazole (PROTONIX) IV  40 mg Intravenous QHS  . potassium chloride  40 mEq Oral BID  . sodium chloride flush  10-40 mL Intracatheter Q12H  . spironolactone  25 mg Oral Daily   Continuous Infusions: . 0.9 % NaCl with KCl 20 mEq / L 25 mL/hr at 01/21/17 0700  . methocarbamol (ROBAXIN)  IV Stopped (01/14/17 0500)  . niCARDipine Stopped (01/20/17 0900)  . ropivacaine (PF) 2 mg/mL (0.2%) Stopped (01/18/17 1812)   PRN Meds: acetaminophen **OR** acetaminophen, albuterol, diphenhydrAMINE **OR** diphenhydrAMINE, fentaNYL (SUBLIMAZE) injection, methocarbamol (ROBAXIN)  IV, naloxone **AND** sodium chloride flush, ondansetron **OR** ondansetron (ZOFRAN) IV, oxyCODONE, sodium chloride flush   Vital Signs    Vitals:   01/21/17 0500 01/21/17 0600 01/21/17 0616 01/21/17 0700  BP: (!) 175/65  (!) 172/88 (!) 175/91  Pulse: (!) 54 (!) 121    Resp: 17 (!) 23  17  Temp:      TempSrc:      SpO2: 100% 98%    Weight: 205 lb 11 oz (93.3 kg)     Height:        Intake/Output Summary (Last 24 hours) at 01/21/17 0729 Last data filed at 01/21/17 0700  Gross per 24 hour  Intake              860 ml  Output              1710 ml  Net             -850 ml   Filed Weights   01/18/17 0400 01/19/17 0400 01/21/17 0500  Weight: 207 lb 3.7 oz (94 kg) 205 lb 7.5 oz (93.2 kg) 205 lb 11 oz (93.3 kg)    Telemetry    NSR with atrial tachycarida- Personally Reviewed  ECG    NA - Personally Reviewed  Physical Exam   GEN: No  acute distress.   Neck: No  JVD Cardiac:  RRR, no murmurs, rubs, or gallops.  Respiratory: Clear   to auscultation bilaterally. GI: Soft, nontender, non-distended, normal bowel sounds  MS:  No edema; No deformity. Neuro:   Nonfocal  Psych: Oriented and appropriate     Labs    Chemistry  Recent Labs Lab 01/15/17 0248 01/16/17 0458 01/17/17 0353 01/20/17 1150 01/21/17 0319  NA 139 141 141 144 142  K 3.7 3.6 3.5 3.1* 3.5  CL 107 104 105 108 105  CO2 24 29 27 28 27   GLUCOSE 232* 126* 101* 138* 125*  BUN 10 10 14 13  16  CREATININE 0.97 0.81 0.87 0.77 1.04*  CALCIUM 8.2* 8.5* 8.2* 7.9* 8.1*  PROT 6.5 6.3* 6.0*  --   --   ALBUMIN 4.3 3.3* 2.9*  --   --   AST 26 31 166*  --   --   ALT 12* 16 85*  --   --   ALKPHOS 40 56 89  --   --   BILITOT 1.0 1.9* 1.3*  --   --   GFRNONAA 55* >60 >60 >60 50*  GFRAA >60 >60 >60 >60 58*  ANIONGAP 8 8 9 8 10      Hematology  Recent Labs Lab 01/17/17 0353 01/20/17 1150 01/21/17 0319  WBC 8.6 12.1* 12.4*  RBC 3.34* 3.41* 3.49*  HGB 10.2* 10.4* 10.7*  HCT 31.9* 33.3* 34.4*  MCV 95.5 97.7 98.6  MCH 30.5 30.5 30.7  MCHC 32.0 31.2 31.1  RDW 14.4 14.0 14.2  PLT 248 335 360    Cardiac EnzymesNo results for input(s): TROPONINI in the last 168 hours. No results for input(s): TROPIPOC in the last 168 hours.   BNPNo results for input(s): BNP, PROBNP in the last 168 hours.   DDimer No results for input(s): DDIMER in the last 168 hours.   Radiology    No results found.  Cardiac Studies   ECHO 01/30/2016 - Left ventricle: The cavity size was normal. There was mild concentric hypertrophy. Systolic function was normal.  The estimated ejection fraction was in the range of 55% to 60%. Wall motion was normal; there were no regional wall motion abnormalities. Features are consistent with a pseudonormal left ventricular filling pattern, with concomitant abnormal relaxation and increased filling pressure (grade 2 diastolic dysfunction). - Aortic valve: There was trivial regurgitation. Mean gradient (S): 7 mm Hg. Peak gradient (S): 12 mm Hg. Valve area (VTI): 1.87 cm^2. Valve area (Vmax): 2.12 cm^2. Valve area (Vmean): 2 cm^2. - Left atrium: The atrium was mildly dilated  Patient Profile     81 y.o. female with longstanding severe HTN, now s/p Whipple procedure, Cardiology consulted for HTN management.  Assessment & Plan    HTN:  No off of Nicardipine.  BP is stable.  HR is up.    SVT:  She has had a couple of bouts of SVT.  Does not appear to be flutter.  It did break easily with carotid massage and she was not particularly uncomfortable with this.  She had resting bradycardia so I will not increase the other meds.  I will increase the spironolactone today.   Signed, Minus Breeding, MD  01/21/2017, 7:29 AM

## 2017-01-21 NOTE — Progress Notes (Signed)
01/21/2017 1603 RN noted pt. Back into Atrial Tachycardia HR 115-130. Pt. Asymptomatic, VSS, resting in bed. Dr. Percival Spanish and Pecolia Ades NP paged and made aware. No new orders received at this time. Only to continue to monitor patient.  Orders enacted.  Shaheen Star, [name]

## 2017-01-21 NOTE — Evaluation (Signed)
Occupational Therapy Evaluation Patient Details Name: BRENTLEY HORRELL MRN: [id number] DOB: Jun 27, 1936 Today's Date: 01/21/2017    History of Present Illness This 81 y.o female admitted with cystic distal pancreatic mass.  She underwent  whipple procedure.   Biopsy + for adinocarcinoma.  PMH includes:  CVA, spinal stenosis, PNA, DM, depression, CKD   Clinical Impression   Pt admitted with above. She demonstrates the below listed deficits and will benefit from continued OT to maximize safety and independence with BADLs.  Pt limited by pain, and a bit of fear of pain.  She was able to ambulate ~4 with min A, and requires mod - max A for ADLs.  She lives alone, and was very active PTA.  Feel she would benefit from SNF level rehab to allow her to maximize independence and safety with ADLs so she can return home with reduced risk of falls, injury, and readmission.       Follow Up Recommendations  SNF;Supervision/Assistance - 24 hour    Equipment Recommendations  3 in 1 bedside commode    Recommendations for Other Services       Precautions / Restrictions Precautions Precautions: Fall Restrictions Weight Bearing Restrictions: No      Mobility Bed Mobility Overal bed mobility: Needs Assistance Bed Mobility: Rolling;Sidelying to Sit Rolling: Min assist Sidelying to sit: Min assist       General bed mobility comments: Pt instructed to roll to side before transitioning to sitting to reduce strain on incision.  She requires assist to lift trunk   Transfers Overall transfer level: Needs assistance Equipment used: Rolling walker (2 wheeled) Transfers: Sit to/from [facility] Sit to Stand: Min assist Stand pivot transfers: Min assist       General transfer comment: min A to steady     Balance Overall balance assessment: Needs assistance Sitting-balance support: Feet supported Sitting balance-Leahy Scale: Fair Sitting balance - Comments: Pt is guarded due to  pain    Standing balance support: Bilateral upper extremity supported Standing balance-Leahy Scale: Poor Standing balance comment: reliant on UE support                            ADL either performed or assessed with clinical judgement   ADL Overall ADL's : Needs assistance/impaired Eating/Feeding: Set up;Sitting   Grooming: Wash/dry hands;Wash/dry face;Oral care;Brushing hair;Minimal assistance;Sitting Grooming Details (indicate cue type and reason): requires assist with combing hair due to incisional discomfort/pain with UE overhead  Upper Body Bathing: Moderate assistance;Sitting   Lower Body Bathing: Maximal assistance;Sit to/from stand   Upper Body Dressing : Moderate assistance;Sitting   Lower Body Dressing: Maximal assistance;Sit to/from stand   Toilet Transfer: Minimal assistance;Stand-pivot;Ambulation;BSC;RW   Toileting- Clothing Manipulation and Hygiene: Moderate assistance;Sit to/from stand       Functional mobility during ADLs: Minimal assistance;Rolling walker General ADL Comments: Pt limited by pain      Vision         Perception     Praxis      Pertinent Vitals/Pain Pain Assessment: 0-10 Pain Score: 6  Pain Location: abdomen  Pain Descriptors / Indicators: Operative site guarding Pain Intervention(s): Monitored during session;Limited activity within patient's tolerance     Hand Dominance Right   Extremity/Trunk Assessment Upper Extremity Assessment Upper Extremity Assessment: Generalized weakness   Lower Extremity Assessment Lower Extremity Assessment: Defer to PT evaluation   Cervical / Trunk Assessment Cervical / Trunk Assessment: Normal   Communication Communication Communication:  No difficulties   Cognition Arousal/Alertness: Awake/alert Behavior During Therapy: WFL for tasks assessed/performed Overall Cognitive Status: Within Functional Limits for tasks assessed                                      General Comments  HR remained high 50's to low 60ss, 02 sats 95% on RA; BP stable     Exercises     Shoulder Instructions      Home Living Family/patient expects to be discharged to:: Skilled nursing facility                                 Additional Comments: Pt lived alone PTA       Prior Functioning/Environment Level of Independence: Independent        Comments: Pt reports she is very active - drives, active in community         OT Problem List: Decreased strength;Decreased activity tolerance;Impaired balance (sitting and/or standing);Decreased knowledge of use of DME or AE;Pain      OT Treatment/Interventions: Self-care/ADL training;Therapeutic exercise;DME and/or AE instruction;Therapeutic activities;Patient/family education;Balance training    OT Goals(Current goals can be found in the care plan section) Acute Rehab OT Goals Patient Stated Goal: to get better  OT Goal Formulation: With patient Time For Goal Achievement: 02/04/17 Potential to Achieve Goals: Good ADL Goals Pt Will Perform Grooming: with min guard assist;standing Pt Will Perform Upper Body Bathing: with set-up;with supervision;sitting Pt Will Perform Lower Body Bathing: with min guard assist;sit to/from stand;with adaptive equipment Pt Will Perform Upper Body Dressing: with supervision;with set-up;sitting Pt Will Perform Lower Body Dressing: with min guard assist;with adaptive equipment;sit to/from stand Pt Will Transfer to Toilet: with min guard assist;ambulating;regular height toilet;bedside commode;grab bars Pt Will Perform Toileting - Clothing Manipulation and hygiene: with min guard assist;sit to/from stand  OT Frequency: Min 2X/week   Barriers to D/C: Decreased caregiver support          Co-evaluation              AM-PAC PT "6 Clicks" Daily Activity     Outcome Measure Help from another person eating meals?: A Little Help from another person taking care of personal  grooming?: A Lot Help from another person toileting, which includes using toliet, bedpan, or urinal?: A Lot Help from another person bathing (including washing, rinsing, drying)?: A Lot Help from another person to put on and taking off regular upper body clothing?: A Lot Help from another person to put on and taking off regular lower body clothing?: A Lot 6 Click Score: 13   End of Session Equipment Utilized During Treatment: Gait belt;Rolling walker Nurse Communication: Mobility status  Activity Tolerance: Patient limited by pain Patient left: in chair;with call bell/phone within reach;with family/visitor present  OT Visit Diagnosis: Unsteadiness on feet (R26.81);Pain Pain - part of body:  (abdominal )                Time: 1328-1410 OT Time Calculation (min): 42 min Charges:  OT General Charges $OT Visit: 1 Procedure OT Evaluation $OT Eval Moderate Complexity: 1 Procedure OT Treatments $Therapeutic Activity: 23-37 mins G-Codes:     [facility], OTR/L [phone]   Lucille Passy M 01/21/2017, 2:34 PM

## 2017-01-21 NOTE — Progress Notes (Signed)
9 Days Post-Op   Subjective/Chief Complaint: CC small emesis yesterday, no nausea now, some flatus, took a little clears, got up to chair. Had an episode of atrial tachycardia this AM.   Objective: Vital signs in last 24 hours: Temp:  [97.8 F (36.6 C)-98.8 F (37.1 C)] 98.5 F (36.9 C) (06/07 0400) Pulse Rate:  [50-121] 121 (06/07 0600) Resp:  [12-28] 16 (06/07 0744) BP: (138-186)/(46-91) 175/91 (06/07 0700) SpO2:  [92 %-100 %] 97 % (06/07 0744) Weight:  [93.3 kg (205 lb 11 oz)] 93.3 kg (205 lb 11 oz) (06/07 0500) Last BM Date: 01/19/17  Intake/Output from previous day: 06/06 0701 - 06/07 0700 In: 860 [P.O.:240; I.V.:620] Out: 1710 [Urine:1580; Drains:130] Intake/Output this shift: No intake/output data recorded.  General appearance: alert and cooperative Resp: clear to auscultation bilaterally Cardio: regular rate and rhythm GI: soft, incision CDI, some BS, drains serous Extremities: mild edema Neurologic: Mental status: Alert, oriented, thought content appropriate  Lab Results:   Recent Labs  01/20/17 1150 01/21/17 0319  WBC 12.1* 12.4*  HGB 10.4* 10.7*  HCT 33.3* 34.4*  PLT 335 360   BMET  Recent Labs  01/20/17 1150 01/21/17 0319  NA 144 142  K 3.1* 3.5  CL 108 105  CO2 28 27  GLUCOSE 138* 125*  BUN 13 16  CREATININE 0.77 1.04*  CALCIUM 7.9* 8.1*   PT/INR  Recent Labs  01/18/17 1707  LABPROT 16.4*  INR 1.31   ABG No results for input(s): PHART, HCO3 in the last 72 hours.  Invalid input(s): PCO2, PO2  Studies/Results: No results found.  Anti-infectives: Anti-infectives    Start     Dose/Rate Route Frequency Ordered Stop   01/12/17 1530  ceFAZolin (ANCEF) IVPB 2g/100 mL premix     2 g 200 mL/hr over 30 Minutes Intravenous Every 8 hours 01/12/17 1521 01/12/17 2312   01/12/17 0736  ceFAZolin (ANCEF) 2-4 GM/100ML-% IVPB    Comments:  [name]   : cabinet override      01/12/17 0736 01/12/17 0801   01/12/17 0622  ceFAZolin  (ANCEF) IVPB 2g/100 mL premix     2 g 200 mL/hr over 30 Minutes Intravenous On call to O.R. 01/12/17 0622 01/12/17 1158      Assessment/Plan: s/p Procedure(s): LAPAROSCOPY DIAGNOSTIC (N/A) WHIPPLE PROCEDURE (N/A) 01/13/27 ([facility]) FEN - Clears for now, lasix and synthroid to PO Deconditioning - PT/OT HTN - cardene off, appreciate cardiology F/U SVT - Dr. Percival Spanish addressing, keep in ICU VTE - start Lovenox  LOS: 9 days    Kaylee Mullins 01/21/2017

## 2017-01-21 NOTE — Progress Notes (Signed)
01/21/2017 6:20 PM PT. Back in NSR/SB 55-62. VSS. Will continue to closely monitor patient.  Jeremy Mclamb, [name]

## 2017-01-21 NOTE — Progress Notes (Signed)
01/21/2017 11:51 AM PT. Elevated BP 199/64, all scheduled BP meds already administered. Cardene restarted. Dr. Percival Spanish paged and made aware. No new orders at this time. Will continue to closely monitor patient.  Kaylee Mullins, [name]

## 2017-01-21 NOTE — Evaluation (Addendum)
Physical Therapy Evaluation Patient Details Name: Kaylee Mullins MRN: [id number] DOB: 06/21/1936 Today's Date: 01/21/2017   History of Present Illness  This 81 y.o female admitted with cystic distal pancreatic mass.  She underwent  whipple procedure.   Biopsy + for adinocarcinoma.  PMH includes:  CVA, spinal stenosis, PNA, DM, depression, CKD  Clinical Impression  Pt was able, on eval, to walk a short distance into the hallway with RW on 3 L O2 [place] with VSS (including BP) and two person assist.  She was independent and did not use a RW PTA.  She lives alone and would benefit from SNF level rehab before returning to her independent apartment.   PT to follow acutely for deficits listed below.       Follow Up Recommendations SNF    Equipment Recommendations  3in1 (PT)    Recommendations for Other Services   NA    Precautions / Restrictions Precautions Precautions: Fall Precaution Comments: monitor BP and vitals Restrictions Weight Bearing Restrictions: No      Mobility  Bed Mobility       General bed mobility comments: Pt OOB in chair after OT session.    Transfers Overall transfer level: Needs assistance Equipment used: Rolling walker (2 wheeled) Transfers: Sit to/from Stand Sit to Stand: Min assist;+2 safety/equipment Stand pivot transfers: Min assist       General transfer comment: min A to support trunk duirng transitions, verbal cues for safe RW use. Second person to help with line management.   Ambulation/Gait Ambulation/Gait assistance: Min assist;+2 safety/equipment Ambulation Distance (Feet): 55 Feet Assistive device: Rolling walker (2 wheeled) Gait Pattern/deviations: Step-through pattern;Trunk flexed Gait velocity: decreased Gait velocity interpretation: Below normal speed for age/gender           Balance Overall balance assessment: Needs assistance Sitting-balance support: Feet supported Sitting balance-Leahy Scale: Fair Sitting balance -  Comments: Pt is guarded due to pain    Standing balance support: Bilateral upper extremity supported Standing balance-Leahy Scale: Poor Standing balance comment: reliant on UE support on RW and external support from PT for balance.                              Pertinent Vitals/Pain Pain Assessment: Faces Pain Score: 6  Faces Pain Scale: Hurts even more Pain Location: abdomen  Pain Descriptors / Indicators: Operative site guarding Pain Intervention(s): Limited activity within patient's tolerance;Monitored during session;Repositioned (pt declined PCA, wants to use pills only)    Home Living Family/patient expects to be discharged to:: Skilled nursing facility Living Arrangements: Alone Available Help at Discharge: Family;Available PRN/intermittently Type of Home: House Home Access: Stairs to enter Entrance Stairs-Rails: None Entrance Stairs-Number of Steps: 1 Home Layout: Two level Home Equipment: Environmental consultant - 2 wheels Additional Comments: Pt lived alone PTA     Prior Function Level of Independence: Independent         Comments: Pt reports she is very active - drives, active in community      Hand Dominance   Dominant Hand: Right    Extremity/Trunk Assessment   Upper Extremity Assessment Upper Extremity Assessment: Defer to OT evaluation    Lower Extremity Assessment Lower Extremity Assessment: Generalized weakness (shakey on her feet, no buckling, relied on RW for support)    Cervical / Trunk Assessment Cervical / Trunk Assessment: Normal  Communication   Communication: No difficulties  Cognition Arousal/Alertness: Awake/alert Behavior During Therapy: WFL for tasks assessed/performed Overall Cognitive  Status: Within Functional Limits for tasks assessed                                        General Comments General comments (skin integrity, edema, etc.): HR and O2 sats remained stable on 3 L O2 [place], BPs better this PM 140s/70s.         Assessment/Plan    PT Assessment Patient needs continued PT services  PT Problem List Decreased strength;Decreased activity tolerance;Decreased mobility;Decreased balance;Decreased knowledge of use of DME;Decreased knowledge of precautions;Pain       PT Treatment Interventions DME instruction;Gait training;Stair training;Functional mobility training;Therapeutic activities;Therapeutic exercise;Neuromuscular re-education;Balance training;Patient/family education    PT Goals (Current goals can be found in the Care Plan section)  Acute Rehab PT Goals Patient Stated Goal: to get better  PT Goal Formulation: With patient Time For Goal Achievement: 02/04/17 Potential to Achieve Goals: Good    Frequency Min 3X/week   Barriers to discharge Decreased caregiver support pt lives alone       AM-PAC PT "6 Clicks" Daily Activity  Outcome Measure Difficulty turning over in bed (including adjusting bedclothes, sheets and blankets)?: Total Difficulty moving from lying on back to sitting on the side of the bed? : Total Difficulty sitting down on and standing up from a chair with arms (e.g., wheelchair, bedside commode, etc,.)?: Total Help needed moving to and from a bed to chair (including a wheelchair)?: A Little Help needed walking in hospital room?: A Little Help needed climbing 3-5 steps with a railing? : A Lot 6 Click Score: 11    End of Session Equipment Utilized During Treatment: Gait belt;Oxygen Activity Tolerance: Patient limited by fatigue;Patient limited by pain Patient left: in chair;with call bell/phone within reach Nurse Communication: Mobility status PT Visit Diagnosis: Muscle weakness (generalized) (M62.81);Difficulty in walking, not elsewhere classified (R26.2);Pain Pain - part of body:  (abdomen)    Time: 1414-1500 PT Time Calculation (min) (ACUTE ONLY): 46 min   Charges:        Wells Guiles B. Faust [place], PT, DPT [phone]     PT Evaluation $PT Eval Moderate  Complexity: 1 Procedure PT Treatments $Gait Training: 8-22 mins $Therapeutic Activity: 8-22 mins   01/21/2017, 3:54 PM

## 2017-01-22 LAB — BASIC METABOLIC PANEL
Anion gap: 9 (ref 5–15)
BUN: 11 mg/dL (ref 6–20)
CO2: 26 mmol/L (ref 22–32)
Calcium: 8.3 mg/dL — ABNORMAL LOW (ref 8.9–10.3)
Chloride: 106 mmol/L (ref 101–111)
Creatinine, Ser: 0.99 mg/dL (ref 0.44–1.00)
GFR calc Af Amer: >60 mL/min (ref >60)
GFR calc non Af Amer: 53 mL/min — ABNORMAL LOW (ref >60)
Glucose, Bld: 127 mg/dL — ABNORMAL HIGH (ref 65–99)
Potassium: 3.5 mmol/L (ref 3.5–5.1)
Sodium: 141 mmol/L (ref 135–145)

## 2017-01-22 LAB — GLUCOSE, CAPILLARY
Glucose-Capillary: 101 mg/dL — ABNORMAL HIGH (ref 65–99)
Glucose-Capillary: 117 mg/dL — ABNORMAL HIGH (ref 65–99)
Glucose-Capillary: 113 mg/dL — ABNORMAL HIGH (ref 65–99)
Glucose-Capillary: 111 mg/dL — ABNORMAL HIGH (ref 65–99)
Glucose-Capillary: 84 mg/dL (ref 65–99)
Glucose-Capillary: 68 mg/dL (ref 65–99)

## 2017-01-22 LAB — CBC
HCT: 33.5 % — ABNORMAL LOW (ref 36.0–46.0)
Hemoglobin: 10.7 g/dL — ABNORMAL LOW (ref 12.0–15.0)
MCH: 30.9 pg (ref 26.0–34.0)
MCHC: 31.9 g/dL (ref 30.0–36.0)
MCV: 96.8 fL (ref 78.0–100.0)
Platelets: 404 10*3/uL — ABNORMAL HIGH (ref 150–400)
RBC: 3.46 MIL/uL — ABNORMAL LOW (ref 3.87–5.11)
RDW: 13.8 % (ref 11.5–15.5)
WBC: 14 10*3/uL — ABNORMAL HIGH (ref 4.0–10.5)

## 2017-01-22 MED ORDER — NIFEDIPINE ER 30 MG PO TB24
30.0000 mg | ORAL_TABLET | Freq: Two times a day (BID) | ORAL | Status: DC | PRN
  Filled 2017-01-22: qty 1

## 2017-01-22 MED ORDER — PROMETHAZINE HCL 25 MG/ML IJ SOLN
12.5000 mg | Freq: Four times a day (QID) | INTRAMUSCULAR | Status: DC | PRN
  Administered 2017-01-22 – 2017-01-30 (×4): 12.5 mg via INTRAVENOUS
  Filled 2017-01-22 (×4): qty 1

## 2017-01-22 MED ORDER — ORAL CARE MOUTH RINSE
15.0000 mL | Freq: Two times a day (BID) | OROMUCOSAL | Status: DC
  Administered 2017-01-23: 15 mL via OROMUCOSAL

## 2017-01-22 MED ORDER — POTASSIUM CHLORIDE 10 MEQ/50ML IV SOLN
10.0000 meq | INTRAVENOUS | Status: AC
  Administered 2017-01-22 (×2): 10 meq via INTRAVENOUS
  Filled 2017-01-22 (×2): qty 50

## 2017-01-22 MED ORDER — NICARDIPINE HCL IN NACL 40-0.83 MG/200ML-% IV SOLN
3.0000 mg/h | INTRAVENOUS | Status: DC
  Administered 2017-01-22: 5 mg/h via INTRAVENOUS
  Administered 2017-01-23: 4 mg/h via INTRAVENOUS
  Administered 2017-01-23 (×2): 5 mg/h via INTRAVENOUS
  Administered 2017-01-24: 3 mg/h via INTRAVENOUS
  Filled 2017-01-22 (×5): qty 200

## 2017-01-22 MED ORDER — MINOXIDIL 2.5 MG PO TABS
5.0000 mg | ORAL_TABLET | Freq: Every day | ORAL | Status: DC
  Filled 2017-01-22: qty 2

## 2017-01-22 MED ORDER — CLONIDINE HCL 0.1 MG/24HR TD PTWK
0.1000 mg | MEDICATED_PATCH | TRANSDERMAL | Status: DC
  Administered 2017-01-22: 0.1 mg via TRANSDERMAL
  Filled 2017-01-22: qty 1

## 2017-01-22 NOTE — Progress Notes (Addendum)
Pt vomited 1L brown emesis with some mucous after having persisting and progressive nausea this evening. Reports feeling "much better" afterwards.   Also of note, pt spontaneously reverted to NSR 70-80s at 01:42.  Made pt NPO, will confer with MD during rounds. Will continue to monitor closely.

## 2017-01-22 NOTE — Progress Notes (Signed)
Progress Note  Patient Name: Kaylee Mullins Date of Encounter: 01/22/2017  Primary Cardiologist:   Dr. Harrington Challenger  Subjective   She had nausea and vomiting last night.  Feels much better after two liters of emesis early this AM.    Inpatient Medications    Scheduled Meds: . amLODipine  10 mg Oral Daily  . chlorhexidine  15 mL Mouth Rinse BID  . Chlorhexidine Gluconate Cloth  6 each Topical Daily  . enoxaparin (LOVENOX) injection  40 mg Subcutaneous Q24H  . fentaNYL   Intravenous Q4H  . furosemide  40 mg Oral Daily  . irbesartan  300 mg Oral Daily   And  . hydrochlorothiazide  25 mg Oral Daily  . insulin aspart  0-15 Units Subcutaneous Q4H  . insulin glargine  30 Units Subcutaneous q morning - 10a  . levothyroxine  200 mcg Oral QAC breakfast  . mouth rinse  15 mL Mouth Rinse q12n4p  . metoprolol tartrate  50 mg Oral BID  . minoxidil  2.5 mg Oral Daily  . pantoprazole (PROTONIX) IV  40 mg Intravenous QHS  . potassium chloride  20 mEq Oral BID  . sodium chloride flush  10-40 mL Intracatheter Q12H  . spironolactone  50 mg Oral Daily   Continuous Infusions: . 0.9 % NaCl with KCl 20 mEq / L 25 mL/hr at 01/22/17 0600  . methocarbamol (ROBAXIN)  IV Stopped (01/14/17 0500)  . niCARDipine 5 mg/hr (01/22/17 0600)   PRN Meds: acetaminophen **OR** acetaminophen, albuterol, diphenhydrAMINE **OR** diphenhydrAMINE, fentaNYL (SUBLIMAZE) injection, methocarbamol (ROBAXIN)  IV, naloxone **AND** sodium chloride flush, ondansetron **OR** ondansetron (ZOFRAN) IV, oxyCODONE, sodium chloride flush   Vital Signs    Vitals:   01/22/17 0500 01/22/17 0530 01/22/17 0600 01/22/17 0630  BP: (!) 142/47 (!) 138/48 (!) 157/48 (!) 147/53  Pulse: 65 (!) 58 61 66  Resp: (!) 21 18 19 20   Temp:      TempSrc:      SpO2: 95% 97% 97% 97%  Weight:      Height:        Intake/Output Summary (Last 24 hours) at 01/22/17 0656 Last data filed at 01/22/17 0600  Gross per 24 hour  Intake          1089.09 ml    Output             3030 ml  Net         -1940.91 ml   Filed Weights   01/18/17 0400 01/19/17 0400 01/21/17 0500  Weight: 207 lb 3.7 oz (94 kg) 205 lb 7.5 oz (93.2 kg) 205 lb 11 oz (93.3 kg)    Telemetry    NSR with atrial tachycarida/SVT prolonged- Personally Reviewed  ECG    NA - Personally Reviewed  Physical Exam   GEN: No  acute distress.   Neck: No  JVD Cardiac:  RRR, no murmurs, rubs, or gallops.  Respiratory: Clear   to auscultation bilaterally. GI: Soft, nontender, non-distended, normal bowel sounds  MS:  Mild edema; No deformity. Neuro:   Nonfocal  Psych: Oriented and appropriate     Labs    Chemistry  Recent Labs Lab 01/16/17 0458 01/17/17 0353 01/20/17 1150 01/21/17 0319 01/22/17 0353  NA 141 141 144 142 141  K 3.6 3.5 3.1* 3.5 3.5  CL 104 105 108 105 106  CO2 29 27 28 27 26   GLUCOSE 126* 101* 138* 125* 127*  BUN 10 14 13 16 11   CREATININE 0.81 0.87  0.77 1.04* 0.99  CALCIUM 8.5* 8.2* 7.9* 8.1* 8.3*  PROT 6.3* 6.0*  --   --   --   ALBUMIN 3.3* 2.9*  --   --   --   AST 31 166*  --   --   --   ALT 16 85*  --   --   --   ALKPHOS 56 89  --   --   --   BILITOT 1.9* 1.3*  --   --   --   GFRNONAA >60 >60 >60 50* 53*  GFRAA >60 >60 >60 58* >60  ANIONGAP 8 9 8 10 9      Hematology  Recent Labs Lab 01/17/17 0353 01/20/17 1150 01/21/17 0319  WBC 8.6 12.1* 12.4*  RBC 3.34* 3.41* 3.49*  HGB 10.2* 10.4* 10.7*  HCT 31.9* 33.3* 34.4*  MCV 95.5 97.7 98.6  MCH 30.5 30.5 30.7  MCHC 32.0 31.2 31.1  RDW 14.4 14.0 14.2  PLT 248 335 360    Cardiac EnzymesNo results for input(s): TROPONINI in the last 168 hours. No results for input(s): TROPIPOC in the last 168 hours.   BNPNo results for input(s): BNP, PROBNP in the last 168 hours.   DDimer No results for input(s): DDIMER in the last 168 hours.   Radiology    No results found.  Cardiac Studies   ECHO 01/30/2016 - Left ventricle: The cavity size was normal. There was mild concentric  hypertrophy. Systolic function was normal. The estimated ejection fraction was in the range of 55% to 60%. Wall motion was normal; there were no regional wall motion abnormalities. Features are consistent with a pseudonormal left ventricular filling pattern, with concomitant abnormal relaxation and increased filling pressure (grade 2 diastolic dysfunction). - Aortic valve: There was trivial regurgitation. Mean gradient (S): 7 mm Hg. Peak gradient (S): 12 mm Hg. Valve area (VTI): 1.87 cm^2. Valve area (Vmax): 2.12 cm^2. Valve area (Vmean): 2 cm^2. - Left atrium: The atrium was mildly dilated  Patient Profile     81 y.o. female with longstanding severe HTN, now s/p Whipple procedure, Cardiology consulted for HTN management.  Assessment & Plan    HTN:    Required nicardipine again yesterday as BP was elevated.  However, I suspect that she is turning the corner as she is feeling better this AM.  BP is the lowest it has been.  I agree with starting clonidine patch and weaning nicardipine drip.  I am actually going to stop the Minipress as the clonidine is started.  She is on several BP meds.   Of note she might have had an allergy to hydralazine earlier.   SVT:  She has had a couple of bouts of SVT.    These can break with carotid massage as needed.  She has not had hypotension with these and she does not feel them.  Continue current therapy.     Signed, Minus Breeding, MD  01/22/2017, 6:56 AM

## 2017-01-22 NOTE — Progress Notes (Addendum)
Pt went into spontaneous sinus tachycardia 120s-130s at 23:08, shortly after cardene drip was titrated up for pt's SBP 170s. Had pt bear down and cough to no effect. Pt has already received scheduled Lopressor this evening. SBP now 140s, will see if pt's BP can tolerate pausing Cardene to see if that will affect pt's HR favorably.  Dr. Jimmy Footman w/[facility] camera'd into room, discussed this situation with her. Explained that pt is on Cardene for HTN and normally has a base HR of 40-50s, so beta blockers may not be best choice to treat at this time. MD had no new ideas to offer at this time.  Will continue to monitor closely.

## 2017-01-22 NOTE — Progress Notes (Addendum)
Physical Therapy Treatment Patient Details Name: Kaylee Mullins MRN: [id number] DOB: [birth year] Today's Date: 01/22/2017    History of Present Illness This 81 y.o female admitted with cystic distal pancreatic mass.  She underwent  whipple procedure.   Biopsy + for adinocarcinoma.  PMH includes:  CVA, spinal stenosis, PNA, DM, depression, CKD    PT Comments    Pt making good progress. Continue to recommend ST-SNF since pt lives alone.   Follow Up Recommendations  SNF     Equipment Recommendations  3in1 (PT)    Recommendations for Other Services       Precautions / Restrictions Precautions Precautions: Fall Precaution Comments: monitor BP and vitals Restrictions Weight Bearing Restrictions: No    Mobility  Bed Mobility Overal bed mobility: Needs Assistance Bed Mobility: Sit to Sidelying         Sit to sidelying: Min guard General bed mobility comments: Assist for safety and lines  Transfers Overall transfer level: Needs assistance Equipment used: Rolling walker (2 wheeled) Transfers: Sit to/from Stand Sit to Stand: Min guard         General transfer comment: Assist for safety  Ambulation/Gait Ambulation/Gait assistance: Min guard Ambulation Distance (Feet): 170 Feet Assistive device: Rolling walker (2 wheeled) Gait Pattern/deviations: Step-through pattern;Trunk flexed;Decreased stride length Gait velocity: decreased Gait velocity interpretation: Below normal speed for age/gender General Gait Details: Assist for safety. Verbal cues for posture. HR up to 140's briefly and down to 80's by end of session. SpO2 97% with amb on RA   Stairs            Wheelchair Mobility    Modified Rankin (Stroke Patients Only)       Balance Overall balance assessment: Needs assistance Sitting-balance support: Feet supported Sitting balance-Leahy Scale: Fair     Standing balance support: Single extremity supported Standing balance-Leahy Scale: Poor Standing  balance comment: UE support and nim guard for static standing                            Cognition Arousal/Alertness: Awake/alert Behavior During Therapy: WFL for tasks assessed/performed Overall Cognitive Status: Within Functional Limits for tasks assessed                                        Exercises      General Comments        Pertinent Vitals/Pain Pain Assessment: 0-10 Faces Pain Scale: Hurts even more Pain Location: abdomen  Pain Descriptors / Indicators: Operative site guarding;Discomfort Pain Intervention(s): Limited activity within patient's tolerance    Home Living                      Prior Function            PT Goals (current goals can now be found in the care plan section) Progress towards PT goals: Progressing toward goals    Frequency    Min 3X/week      PT Plan Current plan remains appropriate    Co-evaluation              AM-PAC PT "6 Clicks" Daily Activity  Outcome Measure  Difficulty turning over in bed (including adjusting bedclothes, sheets and blankets)?: Total Difficulty moving from lying on back to sitting on the side of the bed? : Total Difficulty sitting down on and standing  up from a chair with arms (e.g., wheelchair, bedside commode, etc,.)?: A Little Help needed moving to and from a bed to chair (including a wheelchair)?: A Little Help needed walking in hospital room?: A Little Help needed climbing 3-5 steps with a railing? : A Lot 6 Click Score: 13    End of Session Equipment Utilized During Treatment: Gait belt Activity Tolerance: Patient tolerated treatment well Patient left: with call bell/phone within reach;in bed Nurse Communication: Mobility status PT Visit Diagnosis: Muscle weakness (generalized) (M62.81);Difficulty in walking, not elsewhere classified (R26.2);Pain Pain - part of body:  (abdomen)     Time: 0109-3235 PT Time Calculation (min) (ACUTE ONLY): 30  min  Charges:  $Gait Training: 23-37 mins                    G Codes:       [facility] PT [place] 01/22/2017, 1:49 PM

## 2017-01-22 NOTE — Progress Notes (Signed)
Nutrition Follow-up  DOCUMENTATION CODES:   Obesity unspecified, Severe malnutrition in context of chronic illness  INTERVENTION:   If unable to advance diet next 24 hours recommend starting parenteral nutrition support as pt was severely malnourished prior to surgery.   If diet advanced recommend started glucerna shakes TID  NUTRITION DIAGNOSIS:   Malnutrition (Severe) related to chronic illness (renal cell and pancreatic cancer) as evidenced by energy intake < or equal to 75% for > or equal to 1 month, percent weight loss. Ongoing.   GOAL:   Patient will meet greater than or equal to 90% of their needs Not met.   MONITOR:   Diet advancement, PO intake, Supplement acceptance, Weight trends  ASSESSMENT:   Pt with PMH of DM, renal cell cancer and pancreatic mass s/p ERCP and biopsy postive for cancer symptoms included bloating admitted for new dx of ampullary carcinoma now s/p whipple.   Pt now NPO/CLD x 10 days Pt vomited 2 L 6/7 but does feel some better Labs reviewed: K+ WNL but being supplemented  Diet Order:  Diet NPO time specified Except for: Sips with Meds  Skin:   (abd incision)  Last BM:  6/5 large  Height:   Ht Readings from Last 1 Encounters:  01/12/17 5' 4"  (1.626 m)    Weight:   Wt Readings from Last 1 Encounters:  01/21/17 205 lb 11 oz (93.3 kg)    Ideal Body Weight:  54.5 kg  BMI:  Body mass index is 35.31 kg/m.  Estimated Nutritional Needs:   Kcal:  2000-2200  Protein:  110-120 grams  Fluid:  >/= 2 L/day  EDUCATION NEEDS:   Education needs addressed  Maylon Peppers RD, [place], [place] Pager [phone] After Hours Pager

## 2017-01-22 NOTE — Progress Notes (Signed)
10 Days Post-Op   Subjective/Chief Complaint: CC vomited about 1L at 0200, feels a lot better now but still some nausea. Up in chair. Cardene drip back on.   Objective: Vital signs in last 24 hours: Temp:  [97.6 F (36.4 C)-98.9 F (37.2 C)] 98.7 F (37.1 C) (06/08 0400) Pulse Rate:  [49-142] 64 (06/08 0730) Resp:  [13-34] 18 (06/08 0730) BP: (72-199)/(45-92) 135/55 (06/08 0730) SpO2:  [91 %-100 %] 99 % (06/08 0730) Last BM Date: 01/19/17  Intake/Output from previous day: 06/07 0701 - 06/08 0700 In: 1114.1 [P.O.:30; I.V.:1084.1] Out: 3235 [Urine:2050; Emesis/NG output:1000; Drains:185] Intake/Output this shift: No intake/output data recorded.  General appearance: alert and cooperative Resp: clear to auscultation bilaterally Cardio: regular rate and rhythm GI: soft, incision CDI, a few BS, drains SS Extremities: edema mild  Lab Results:   Recent Labs  01/20/17 1150 01/21/17 0319  WBC 12.1* 12.4*  HGB 10.4* 10.7*  HCT 33.3* 34.4*  PLT 335 360   BMET  Recent Labs  01/21/17 0319 01/22/17 0353  NA 142 141  K 3.5 3.5  CL 105 106  CO2 27 26  GLUCOSE 125* 127*  BUN 16 11  CREATININE 1.04* 0.99  CALCIUM 8.1* 8.3*   PT/INR No results for input(s): LABPROT, INR in the last 72 hours. ABG No results for input(s): PHART, HCO3 in the last 72 hours.  Invalid input(s): PCO2, PO2  Studies/Results: No results found.  Anti-infectives: Anti-infectives    Start     Dose/Rate Route Frequency Ordered Stop   01/12/17 1530  ceFAZolin (ANCEF) IVPB 2g/100 mL premix     2 g 200 mL/hr over 30 Minutes Intravenous Every 8 hours 01/12/17 1521 01/12/17 2312   01/12/17 0736  ceFAZolin (ANCEF) 2-4 GM/100ML-% IVPB    Comments:  [name]   : cabinet override      01/12/17 0736 01/12/17 0801   01/12/17 0622  ceFAZolin (ANCEF) IVPB 2g/100 mL premix     2 g 200 mL/hr over 30 Minutes Intravenous On call to O.R. 01/12/17 0622 01/12/17 1158      Assessment/Plan: s/p  Procedure(s): LAPAROSCOPY DIAGNOSTIC (N/A) WHIPPLE PROCEDURE (N/A) 01/13/27 ([facility]erly) FEN - ileus, NPO plus sips with meds, supplement K Deconditioning - PT/OT HTN - cardene back on, add clonidine patch 0.1 SVT - Dr. Percival Spanish addressing, keep in ICU VTE - Lovenox  LOS: 10 days    Trebor Galdamez E 01/22/2017

## 2017-01-23 DIAGNOSIS — I130 Hypertensive heart and chronic kidney disease with heart failure and stage 1 through stage 4 chronic kidney disease, or unspecified chronic kidney disease: Secondary | ICD-10-CM

## 2017-01-23 DIAGNOSIS — I471 Supraventricular tachycardia: Secondary | ICD-10-CM

## 2017-01-23 LAB — BASIC METABOLIC PANEL
Anion gap: 9 (ref 5–15)
BUN: 8 mg/dL (ref 6–20)
CO2: 25 mmol/L (ref 22–32)
Calcium: 8.2 mg/dL — ABNORMAL LOW (ref 8.9–10.3)
Chloride: 106 mmol/L (ref 101–111)
Creatinine, Ser: 0.99 mg/dL (ref 0.44–1.00)
GFR calc Af Amer: >60 mL/min (ref >60)
GFR calc non Af Amer: 53 mL/min — ABNORMAL LOW (ref >60)
Glucose, Bld: 82 mg/dL (ref 65–99)
Potassium: 3.4 mmol/L — ABNORMAL LOW (ref 3.5–5.1)
Sodium: 140 mmol/L (ref 135–145)

## 2017-01-23 LAB — GLUCOSE, CAPILLARY
Glucose-Capillary: 119 mg/dL — ABNORMAL HIGH (ref 65–99)
Glucose-Capillary: 132 mg/dL — ABNORMAL HIGH (ref 65–99)
Glucose-Capillary: 52 mg/dL — ABNORMAL LOW (ref 65–99)
Glucose-Capillary: 56 mg/dL — ABNORMAL LOW (ref 65–99)
Glucose-Capillary: 77 mg/dL (ref 65–99)
Glucose-Capillary: 81 mg/dL (ref 65–99)
Glucose-Capillary: 81 mg/dL (ref 65–99)
Glucose-Capillary: 86 mg/dL (ref 65–99)
Glucose-Capillary: 92 mg/dL (ref 65–99)

## 2017-01-23 LAB — CBC
HCT: 34.8 % — ABNORMAL LOW (ref 36.0–46.0)
Hemoglobin: 11 g/dL — ABNORMAL LOW (ref 12.0–15.0)
MCH: 30.6 pg (ref 26.0–34.0)
MCHC: 31.6 g/dL (ref 30.0–36.0)
MCV: 96.7 fL (ref 78.0–100.0)
Platelets: 493 10*3/uL — ABNORMAL HIGH (ref 150–400)
RBC: 3.6 MIL/uL — ABNORMAL LOW (ref 3.87–5.11)
RDW: 13.7 % (ref 11.5–15.5)
WBC: 13.1 10*3/uL — ABNORMAL HIGH (ref 4.0–10.5)

## 2017-01-23 LAB — MAGNESIUM: Magnesium: 1.7 mg/dL (ref 1.7–2.4)

## 2017-01-23 LAB — PHOSPHORUS: Phosphorus: 2.6 mg/dL (ref 2.5–4.6)

## 2017-01-23 MED ORDER — DEXTROSE 50 % IV SOLN
25.0000 mL | Freq: Once | INTRAVENOUS | Status: AC
  Administered 2017-01-23: 25 mL via INTRAVENOUS

## 2017-01-23 MED ORDER — MAGNESIUM SULFATE 2 GM/50ML IV SOLN
2.0000 g | Freq: Once | INTRAVENOUS | Status: AC
  Administered 2017-01-23: 2 g via INTRAVENOUS
  Filled 2017-01-23: qty 50

## 2017-01-23 MED ORDER — DEXTROSE 50 % IV SOLN
INTRAVENOUS | Status: AC
  Filled 2017-01-23: qty 50

## 2017-01-23 MED ORDER — POLYETHYLENE GLYCOL 3350 17 G PO PACK
17.0000 g | PACK | Freq: Every day | ORAL | Status: DC
  Administered 2017-01-23 – 2017-02-05 (×10): 17 g via ORAL
  Filled 2017-01-23 (×13): qty 1

## 2017-01-23 MED ORDER — POTASSIUM CHLORIDE 10 MEQ/50ML IV SOLN
10.0000 meq | INTRAVENOUS | Status: AC
  Administered 2017-01-23 (×4): 10 meq via INTRAVENOUS
  Filled 2017-01-23 (×3): qty 50

## 2017-01-23 MED ORDER — POTASSIUM CHLORIDE IN NACL 20-0.9 MEQ/L-% IV SOLN
INTRAVENOUS | Status: AC
  Administered 2017-01-24: 04:00:00 via INTRAVENOUS
  Filled 2017-01-23 (×2): qty 1000

## 2017-01-23 MED ORDER — ORAL CARE MOUTH RINSE: 15.0000 mL | Freq: Two times a day (BID) | OROMUCOSAL | Status: DC

## 2017-01-23 MED ORDER — TRACE MINERALS CR-CU-MN-SE-ZN 10-1000-500-60 MCG/ML IV SOLN
INTRAVENOUS | Status: AC
  Administered 2017-01-23: 18:00:00 via INTRAVENOUS
  Filled 2017-01-23: qty 960

## 2017-01-23 MED ORDER — ORAL CARE MOUTH RINSE
15.0000 mL | Freq: Two times a day (BID) | OROMUCOSAL | Status: DC
  Administered 2017-01-24 – 2017-01-28 (×6): 15 mL via OROMUCOSAL

## 2017-01-23 MED ORDER — CHLORHEXIDINE GLUCONATE 0.12 % MT SOLN: 15.0000 mL | Freq: Two times a day (BID) | OROMUCOSAL | Status: DC

## 2017-01-23 MED ORDER — CHLORHEXIDINE GLUCONATE 0.12 % MT SOLN
15.0000 mL | Freq: Two times a day (BID) | OROMUCOSAL | Status: DC
  Administered 2017-01-23 – 2017-01-30 (×13): 15 mL via OROMUCOSAL
  Filled 2017-01-23 (×13): qty 15

## 2017-01-23 MED ORDER — BISACODYL 10 MG RE SUPP
10.0000 mg | Freq: Every day | RECTAL | Status: DC | PRN
  Administered 2017-01-25: 10 mg via RECTAL
  Filled 2017-01-23: qty 1

## 2017-01-23 MED ORDER — INSULIN GLARGINE 100 UNIT/ML ~~LOC~~ SOLN
30.0000 [IU] | Freq: Every day | SUBCUTANEOUS | Status: DC
  Administered 2017-01-24 – 2017-01-25 (×2): 30 [IU] via SUBCUTANEOUS
  Filled 2017-01-23 (×3): qty 0.3

## 2017-01-23 MED ORDER — BISACODYL 10 MG RE SUPP
10.0000 mg | Freq: Once | RECTAL | Status: AC
  Administered 2017-01-23: 10 mg via RECTAL
  Filled 2017-01-23: qty 1

## 2017-01-23 NOTE — Progress Notes (Signed)
[place] CONSULT NOTE   Pharmacy Consult for TPN Indication: Ileus  Patient Measurements: Height: 5\' 4"  (162.6 cm) Weight: 203 lb 14.8 oz (92.5 kg) IBW/kg (Calculated) : 54.7 TPN AdjBW (KG): 64.2 Body mass index is 35 kg/m.  Assessment: 81 year old female with severe malnutrition related to renal cell and pancreatic cancer who presented with abdominal mass on 01/12/17 s/p whipple procedure same day now with ileus unable to keep down orals and significant N/V.   GI: 2 Abdominal drains-71mL out in 1, 73mL other. NGT removed 6/1  LBM 6/5.  Endo: CBGs 52-119 (dextrose given this AM for hypoglycemia -- NPO on Lantus.  Insulin requirements in the past 24 hours: 0 units SSI,  Lantus 30 daily Lytes: K down 3.4 on Lasix- 4 runs given this AM + scheduled KCl 20 BID. Last Mg 5/31 was 2. Last Phos 6/1 was 2.7.  Renal: SCr 0.99 (baseline ~ 0.8). UOP @ 1 cc/kg/hr. Net -1.8L. Pulm: RA Cards: BP elevated on Nicardipine drip @ 5 mg/hr.  -Lasix + Spironolactone, Amlodipine, Irbesartan + HCTZ, Metoprolol Hepatobil: Last LFTs 6/2 - elevated. AST 166, ALT 85. Tbili 1.3. Alb 2.9.  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 Fentanyl PCA ID: WBC 13.1, Afebrile  Best Practices: Lovenox 40, PPI IV TPN Access: Triple lumen CVC (confirmed per RN though documented double in chart) TPN start date: 01/23/17  Nutritional Goals (per RD recommendation on 6/8): KCal: 2000-2200 Protein: 110-120g   Current Nutrition:  NPO TPN  Plan:  Initiate Clinimix E 5/15 at 29ml/hr Hold 20% lipid emulsion for first 7 days for ICU patients per ASPEN guidelines (Start date 01/30/17) This provides 48 g of protein and 682 kCals per day meeting 44% of protein and 34% of kCal needs.  Reduce IVMF (NS +20K) to 35 ml/hr at 1800 when TPN bag hangs. Add MVI in TPN Due to national shortage, trace elements every other day in TPN, next 6/9 Continue Moderate SSI q4h and adjust as  needed Continue but adjust schedule of Lantus 30 units to QHS - discussed with Dr. Dalbert Batman. Monitor TPN labs, QMon/Thurs Repeat Mg/Phos today and replete if needed F/U for progression of enteral diet as able  Sloan Leiter, PharmD, BCPS Clinical Pharmacist Clinical phone 01/23/2017 until 3:30 PM- [phone] After hours, please call #[phone] 01/23/2017,8:37 AM

## 2017-01-23 NOTE — Progress Notes (Signed)
Hypoglycemic Event  CBG: 56 @23 :55  Treatment: 15 GM carbohydrate snack  Symptoms: None  Follow-up CBG: Time:00:09 CBG Result:52  2nd Treatment: D50 IV 24mL  Follow-up CBG: Time:00:38 CBG Result:119  Possible Reasons for Event: Vomiting, Inadequate meal intake and Other: possible poor gut absorption of nutrients  Comments/MD notified: Will address pt's nutrition status with MD during rounds.  Will continue to monitor.  Kaylee Mullins

## 2017-01-23 NOTE — Progress Notes (Signed)
11 Days Post-Op   Subjective/Chief Complaint: CC feels nauseated and like there is something in her throat she can't bring up. No bowel movement since the 5th per bedside rn. Walking with therapies   Objective: Vital signs in last 24 hours: Temp:  [97.7 F (36.5 C)-99.4 F (37.4 C)] 98.7 F (37.1 C) (06/09 0320) Pulse Rate:  [55-141] 63 (06/09 0730) Resp:  [14-27] 19 (06/09 0730) BP: (98-191)/(48-87) 161/64 (06/09 0730) SpO2:  [92 %-100 %] 98 % (06/09 0730) Weight:  [92.5 kg (203 lb 14.8 oz)] 92.5 kg (203 lb 14.8 oz) (06/09 0500) Last BM Date: 01/19/17  Intake/Output from previous day: 06/08 0701 - 06/09 0700 In: 2294.9 [P.O.:220; I.V.:2074.9] Out: 2200 [Urine:2150; Drains:50] Intake/Output this shift: No intake/output data recorded.  General appearance: alert and cooperative Resp: clear to auscultation bilaterally Cardio: regular rate and rhythm GI: soft, incision CDI, a few BS, drains SS Extremities: edema mild  Lab Results:   Recent Labs  01/22/17 1139 01/23/17 0528  WBC 14.0* 13.1*  HGB 10.7* 11.0*  HCT 33.5* 34.8*  PLT 404* 493*   BMET  Recent Labs  01/22/17 0353 01/23/17 0528  NA 141 140  K 3.5 3.4*  CL 106 106  CO2 26 25  GLUCOSE 127* 82  BUN 11 8  CREATININE 0.99 0.99  CALCIUM 8.3* 8.2*   PT/INR No results for input(s): LABPROT, INR in the last 72 hours. ABG No results for input(s): PHART, HCO3 in the last 72 hours.  Invalid input(s): PCO2, PO2  Studies/Results: No results found.  Anti-infectives: Anti-infectives    Start     Dose/Rate Route Frequency Ordered Stop   01/12/17 1530  ceFAZolin (ANCEF) IVPB 2g/100 mL premix     2 g 200 mL/hr over 30 Minutes Intravenous Every 8 hours 01/12/17 1521 01/12/17 2312   01/12/17 0736  ceFAZolin (ANCEF) 2-4 GM/100ML-% IVPB    Comments:  [name]   : cabinet override      01/12/17 0736 01/12/17 0801   01/12/17 0622  ceFAZolin (ANCEF) IVPB 2g/100 mL premix     2 g 200 mL/hr over 30  Minutes Intravenous On call to O.R. 01/12/17 0622 01/12/17 1158      Assessment/Plan: s/p Procedure(s): LAPAROSCOPY DIAGNOSTIC (N/A) WHIPPLE PROCEDURE (N/A) 01/13/27 ([facility]er[facility]) FEN - ileus, NPO plus sips with meds, supplement/ replace K Constipation- add miralax, suppositories Deconditioning - PT/OT HTN - cardene back on, appreciate cardiology assistance SVT - Dr. Percival Spanish addressing, keep in ICU VTE - Lovenox  LOS: 11 days    Clovis Riley 01/23/2017

## 2017-01-23 NOTE — Progress Notes (Signed)
Progress Note  Patient Name: Kaylee Mullins Date of Encounter: 01/23/2017  Primary Cardiologist: Dr. Harrington Challenger  Subjective   Still having some nausea and vomiting. Denies chest pain. Notes intermittent palpitations. Unable to keep down much oral intake  Inpatient Medications    Scheduled Meds: . amLODipine  10 mg Oral Daily  . bisacodyl  10 mg Rectal Once  . Chlorhexidine Gluconate Cloth  6 each Topical Daily  . cloNIDine  0.1 mg Transdermal Q Fri  . enoxaparin (LOVENOX) injection  40 mg Subcutaneous Q24H  . fentaNYL   Intravenous Q4H  . furosemide  40 mg Oral Daily  . irbesartan  300 mg Oral Daily   And  . hydrochlorothiazide  25 mg Oral Daily  . insulin aspart  0-15 Units Subcutaneous Q4H  . insulin glargine  30 Units Subcutaneous q morning - 10a  . levothyroxine  200 mcg Oral QAC breakfast  . mouth rinse  15 mL Mouth Rinse BID  . metoprolol tartrate  50 mg Oral BID  . pantoprazole (PROTONIX) IV  40 mg Intravenous QHS  . polyethylene glycol  17 g Oral Daily  . potassium chloride  20 mEq Oral BID  . sodium chloride flush  10-40 mL Intracatheter Q12H  . spironolactone  50 mg Oral Daily   Continuous Infusions: . 0.9 % NaCl with KCl 20 mEq / L 75 mL/hr at 01/23/17 0700  . methocarbamol (ROBAXIN)  IV Stopped (01/14/17 0500)  . niCARDipine 5 mg/hr (01/23/17 0700)  . potassium chloride     PRN Meds: acetaminophen **OR** acetaminophen, albuterol, bisacodyl, diphenhydrAMINE **OR** diphenhydrAMINE, fentaNYL (SUBLIMAZE) injection, methocarbamol (ROBAXIN)  IV, naloxone **AND** sodium chloride flush, NIFEdipine, ondansetron **OR** ondansetron (ZOFRAN) IV, oxyCODONE, promethazine, sodium chloride flush   Vital Signs    Vitals:   01/23/17 0630 01/23/17 0700 01/23/17 0730 01/23/17 0808  BP: (!) 161/66 (!) 153/63 (!) 161/64   Pulse: 79 63 63   Resp: (!) 27 (!) 21 19   Temp:    98.7 F (37.1 C)  TempSrc:    Oral  SpO2: 92% 99% 98%   Weight:      Height:        Intake/Output  Summary (Last 24 hours) at 01/23/17 0852 Last data filed at 01/23/17 0819  Gross per 24 hour  Intake          2244.88 ml  Output             2400 ml  Net          -155.12 ml   Filed Weights   01/19/17 0400 01/21/17 0500 01/23/17 0500  Weight: 205 lb 7.5 oz (93.2 kg) 205 lb 11 oz (93.3 kg) 203 lb 14.8 oz (92.5 kg)    Telemetry    Normal sinus rhythm with runs of SVT - Personally Reviewed  ECG    Normal sinus rhythm - Personally Reviewed  Physical Exam   GEN: No acute distress.   Neck: 7 cm JVD Cardiac: RRR, no murmurs, rubs, or gallops.  Respiratory: Clear to auscultation bilaterally. GI: Soft, nontender, non-distended  MS: No edema; No deformity. Neuro:  Nonfocal  Psych: Normal affect   Labs    Chemistry Recent Labs Lab 01/17/17 0353  01/21/17 0319 01/22/17 0353 01/23/17 0528  NA 141  < > 142 141 140  K 3.5  < > 3.5 3.5 3.4*  CL 105  < > 105 106 106  CO2 27  < > 27 26 25   GLUCOSE 101*  < >  125* 127* 82  BUN 14  < > 16 11 8   CREATININE 0.87  < > 1.04* 0.99 0.99  CALCIUM 8.2*  < > 8.1* 8.3* 8.2*  PROT 6.0*  --   --   --   --   ALBUMIN 2.9*  --   --   --   --   AST 166*  --   --   --   --   ALT 85*  --   --   --   --   ALKPHOS 89  --   --   --   --   BILITOT 1.3*  --   --   --   --   GFRNONAA >60  < > 50* 53* 53*  GFRAA >60  < > 58* >60 >60  ANIONGAP 9  < > 10 9 9   < > = values in this interval not displayed.   Hematology Recent Labs Lab 01/21/17 0319 01/22/17 1139 01/23/17 0528  WBC 12.4* 14.0* 13.1*  RBC 3.49* 3.46* 3.60*  HGB 10.7* 10.7* 11.0*  HCT 34.4* 33.5* 34.8*  MCV 98.6 96.8 96.7  MCH 30.7 30.9 30.6  MCHC 31.1 31.9 31.6  RDW 14.2 13.8 13.7  PLT 360 404* 493*    Cardiac EnzymesNo results for input(s): TROPONINI in the last 168 hours. No results for input(s): TROPIPOC in the last 168 hours.   BNPNo results for input(s): BNP, PROBNP in the last 168 hours.   DDimer No results for input(s): DDIMER in the last 168 hours.   Radiology     No results found.  Cardiac Studies   None  Patient Profile     81 y.o. female with long-standing hypertension, status post Whipple procedure, with uncontrolled hypertension, and breakthrough episodes of SVT, sensitive to vagal maneuvers and carotid massage  Assessment & Plan    1. SVT - she continues to have intermittent episodes which she appears to tolerate reasonably well. We'll use carotid massage or intravenous adenosine if needed to control 2. Hypertensive heart disease - she is currently on intravenous nicardipine. Until she can take oral medications reasonably well, I would continue her intravenous blood pressure medications as you are doing.  Signed, Cristopher Peru, MD  01/23/2017, 8:52 AM  Patient ID: Kaylee Mullins, female   DOB: Oct 20, 1935, 81 y.o.   MRN: [id number]

## 2017-01-23 NOTE — Progress Notes (Signed)
Nutrition Consult/Follow Up  DOCUMENTATION CODES:   Obesity unspecified, Severe malnutrition in context of chronic illness  INTERVENTION:    TPN per pharmacy   NUTRITION DIAGNOSIS:   Malnutrition (Severe) related to chronic illness (renal cell and pancreatic cancer) as evidenced by energy intake < or equal to 75% for > or equal to 1 month, percent weight loss  Ongoing  GOAL:   Patient will meet greater than or equal to 90% of their needs  Progressing  MONITOR:   Diet advancement, PO intake, Supplement acceptance, Weight trends  ASSESSMENT:   Pt with PMH of DM, renal cell cancer and pancreatic mass s/p ERCP and biopsy postive for cancer symptoms included bloating admitted for new dx of ampullary carcinoma now s/p whipple.   Pt NPO/CLD x 11 days.  Now with ileus. Nausea ongoing.  No bowel movement since 6/5. Labs reviewed.  Potassium 3.4 (L).  CBG's [id number]. TPN to start tonight via triple lumen CVC.  Patient to receive TPN with Clinimix E 5/15 @ 40 ml/hr.  Lipids on hold per ASPEN guidelines.  Provides 682 kcal and 48 grams protein per day. Meets 34% minimum estimated energy needs and 44% minimum estimated protein needs.  Diet Order:  Diet NPO time specified Except for: Sips with Meds .TPN (CLINIMIX-E) Adult  Skin:   (abd incision)  Last BM:  6/5  Height:   Ht Readings from Last 1 Encounters:  01/23/17 5\' 4"  (1.626 m)   Weight:   Wt Readings from Last 1 Encounters:  01/23/17 203 lb 14.8 oz (92.5 kg)    Ideal Body Weight:  54.5 kg  BMI:  Body mass index is 35 kg/m.  Estimated Nutritional Needs:   Kcal:  2000-2200  Protein:  110-120 grams  Fluid:  >/= 2 L/day  EDUCATION NEEDS:   No education needs identified at this time   Arthur Holms, RD, LDN Pager #: [phone] After-Hours Pager #: [phone]

## 2017-01-24 LAB — COMPREHENSIVE METABOLIC PANEL
ALT: 29 U/L (ref 14–54)
AST: 20 U/L (ref 15–41)
Albumin: 2.9 g/dL — ABNORMAL LOW (ref 3.5–5.0)
Alkaline Phosphatase: 69 U/L (ref 38–126)
Anion gap: 10 (ref 5–15)
BUN: 9 mg/dL (ref 6–20)
CO2: 24 mmol/L (ref 22–32)
Calcium: 8.2 mg/dL — ABNORMAL LOW (ref 8.9–10.3)
Chloride: 103 mmol/L (ref 101–111)
Creatinine, Ser: 1.11 mg/dL — ABNORMAL HIGH (ref 0.44–1.00)
GFR calc Af Amer: 54 mL/min — ABNORMAL LOW (ref >60)
GFR calc non Af Amer: 46 mL/min — ABNORMAL LOW (ref >60)
Glucose, Bld: 169 mg/dL — ABNORMAL HIGH (ref 65–99)
Potassium: 4.3 mmol/L (ref 3.5–5.1)
Sodium: 137 mmol/L (ref 135–145)
Total Bilirubin: 0.9 mg/dL (ref 0.3–1.2)
Total Protein: 5.8 g/dL — ABNORMAL LOW (ref 6.5–8.1)

## 2017-01-24 LAB — CBC
HCT: 33.8 % — ABNORMAL LOW (ref 36.0–46.0)
Hemoglobin: 10.7 g/dL — ABNORMAL LOW (ref 12.0–15.0)
MCH: 30.5 pg (ref 26.0–34.0)
MCHC: 31.7 g/dL (ref 30.0–36.0)
MCV: 96.3 fL (ref 78.0–100.0)
Platelets: 499 10*3/uL — ABNORMAL HIGH (ref 150–400)
RBC: 3.51 MIL/uL — ABNORMAL LOW (ref 3.87–5.11)
RDW: 13.8 % (ref 11.5–15.5)
WBC: 12.3 10*3/uL — ABNORMAL HIGH (ref 4.0–10.5)

## 2017-01-24 LAB — GLUCOSE, CAPILLARY
Glucose-Capillary: 136 mg/dL — ABNORMAL HIGH (ref 65–99)
Glucose-Capillary: 207 mg/dL — ABNORMAL HIGH (ref 65–99)
Glucose-Capillary: 215 mg/dL — ABNORMAL HIGH (ref 65–99)
Glucose-Capillary: 220 mg/dL — ABNORMAL HIGH (ref 65–99)
Glucose-Capillary: 239 mg/dL — ABNORMAL HIGH (ref 65–99)

## 2017-01-24 LAB — DIFFERENTIAL
Basophils Absolute: 0 10*3/uL (ref 0.0–0.1)
Basophils Relative: 0 %
Eosinophils Absolute: 0.2 10*3/uL (ref 0.0–0.7)
Eosinophils Relative: 2 %
Lymphocytes Relative: 17 %
Lymphs Abs: 2.1 10*3/uL (ref 0.7–4.0)
Monocytes Absolute: 0.9 10*3/uL (ref 0.1–1.0)
Monocytes Relative: 7 %
Neutro Abs: 9.1 10*3/uL — ABNORMAL HIGH (ref 1.7–7.7)
Neutrophils Relative %: 74 %

## 2017-01-24 LAB — PHOSPHORUS: Phosphorus: 3 mg/dL (ref 2.5–4.6)

## 2017-01-24 LAB — PREALBUMIN: Prealbumin: 11.8 mg/dL — ABNORMAL LOW (ref 18–38)

## 2017-01-24 LAB — TRIGLYCERIDES: Triglycerides: 136 mg/dL (ref <150)

## 2017-01-24 LAB — MAGNESIUM: Magnesium: 1.9 mg/dL (ref 1.7–2.4)

## 2017-01-24 MED ORDER — POTASSIUM CHLORIDE IN NACL 20-0.9 MEQ/L-% IV SOLN
INTRAVENOUS | Status: DC
  Administered 2017-01-25 – 2017-02-01 (×3): via INTRAVENOUS
  Filled 2017-01-24 (×6): qty 1000

## 2017-01-24 MED ORDER — M.V.I. ADULT IV INJ
INTRAVENOUS | Status: AC
  Administered 2017-01-24: 18:00:00 via INTRAVENOUS
  Filled 2017-01-24: qty 1440

## 2017-01-24 NOTE — Progress Notes (Signed)
Pt refusing to have PICC placed until she can speak with Dr [name] on 01-25-17, per [facility] RN.

## 2017-01-24 NOTE — Progress Notes (Signed)
[place] CONSULT NOTE   Pharmacy Consult for TPN Indication: Ileus  Patient Measurements: Height: 5\' 4"  (162.6 cm) Weight: 203 lb 14.8 oz (92.5 kg) IBW/kg (Calculated) : 54.7 TPN AdjBW (KG): 64.2 Body mass index is 35 kg/m.  Assessment: 81 year old female with severe malnutrition related to renal cell and pancreatic cancer who presented with abdominal mass on 01/12/17 s/p whipple procedure same day now with ileus unable to keep down orals and significant N/V.   GI: 2 Abdominal drains-33mL out in 1, 10mL other. NGT removed 6/1  LBM 6/5.  Endo: CBGs 81-169 (increasing w/ start of TPN) Insulin requirements in the past 24 hours: 7 units SSI,  Lantus changed to QHS. Lytes: K 4.3 on scheduled KCl 20 BID (on Lasix 40 po daily; Spironolactone). Mg 1.9. Phos 3.  Renal: SCr up 1.11 (baseline ~ 0.8). UOP documented 0.4 cc/kg/hr but per RN this is not accurate as patient refusing to measure because "peeing too much". Net -165.8L. Pulm: RA Cards: BP improving on Nicardipine drip decreasing at 2 mg/hr. NSR, 50-60s -Lasix + Spironolactone, Amlodipine, Irbesartan + HCTZ, Metoprolol Hepatobil: Last LFTs elevated on 6/2, now down to wnl. Tbili wnl. PreAlb 11.8. 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 Fentanyl PCA ID: WBC 13.1>>12.3, Afebrile  Best Practices: Lovenox 40, PPI IV TPN Access: Double lumen CVC (plan to change to Triple Lumen today or tomorrow) TPN start date: 01/23/17  Nutritional Goals (per RD recommendation on 6/8): KCal: 2000-2200 Protein: 110-120g   Current Nutrition:  NPO, sips with meds TPN  Plan:  Increase Clinimix E 5/15 to 26ml/hr Hold 20% lipid emulsion for first 7 days for ICU patients per ASPEN guidelines (Start date 01/30/17) This provides 72 g of protein and 1022 kCals per day meeting 65% of protein and 51% of kCal needs.  Reduce IVMF (NS +20K) to KVO at 1800 when TPN bag hangs. Add MVI in TPN Due to national  shortage, trace elements every other day in TPN, next 6/11 Continue Moderate SSI q4h and adjust as needed Continue but adjust schedule of Lantus 30 units to QHS - discussed with Dr. Dalbert Batman- watch CBGs and need to adjust dosing. Monitor TPN labs, QMon/Thurs F/U for progression of enteral diet as able  Follow-up change to triple lumen with TPN as TPN must be infused by itself and Lipids to be started end of next week.   Sloan Leiter, PharmD, BCPS Clinical Pharmacist Clinical phone 01/24/2017 until 3:30 PM- [phone] After hours, please call #[phone] 01/24/2017,7:01 AM

## 2017-01-24 NOTE — Progress Notes (Signed)
Progress Note  Patient Name: Kaylee Mullins Date of Encounter: 01/24/2017  Primary Cardiologist: Dr. Harrington Challenger  Subjective   Still having mild nausea. No vomiting. One small bowel movement. Taking sips of oral fluids.  Inpatient Medications    Scheduled Meds: . amLODipine  10 mg Oral Daily  . chlorhexidine  15 mL Mouth Rinse BID  . Chlorhexidine Gluconate Cloth  6 each Topical Daily  . cloNIDine  0.1 mg Transdermal Q Fri  . enoxaparin (LOVENOX) injection  40 mg Subcutaneous Q24H  . fentaNYL   Intravenous Q4H  . furosemide  40 mg Oral Daily  . irbesartan  300 mg Oral Daily   And  . hydrochlorothiazide  25 mg Oral Daily  . insulin aspart  0-15 Units Subcutaneous Q4H  . insulin glargine  30 Units Subcutaneous QHS  . levothyroxine  200 mcg Oral QAC breakfast  . mouth rinse  15 mL Mouth Rinse q12n4p  . metoprolol tartrate  50 mg Oral BID  . pantoprazole (PROTONIX) IV  40 mg Intravenous QHS  . polyethylene glycol  17 g Oral Daily  . potassium chloride  20 mEq Oral BID  . sodium chloride flush  10-40 mL Intracatheter Q12H  . spironolactone  50 mg Oral Daily   Continuous Infusions: . Marland KitchenTPN (CLINIMIX-E) Adult 40 mL/hr at 01/24/17 0900  . 0.9 % NaCl with KCl 20 mEq / L 35 mL/hr at 01/24/17 0900  . methocarbamol (ROBAXIN)  IV Stopped (01/14/17 0500)  . niCARDipine 1 mg/hr (01/24/17 0900)   PRN Meds: acetaminophen **OR** acetaminophen, albuterol, bisacodyl, diphenhydrAMINE **OR** diphenhydrAMINE, fentaNYL (SUBLIMAZE) injection, methocarbamol (ROBAXIN)  IV, naloxone **AND** sodium chloride flush, NIFEdipine, ondansetron **OR** ondansetron (ZOFRAN) IV, oxyCODONE, promethazine, sodium chloride flush   Vital Signs    Vitals:   01/24/17 0700 01/24/17 0800 01/24/17 0819 01/24/17 0900  BP: (!) 140/50 (!) 117/107  (!) 147/56  Pulse: (!) 58 (!) 57  (!) 56  Resp: 18 16  17   Temp:   98.5 F (36.9 C)   TempSrc:   Oral   SpO2: 98% 98%  99%  Weight:      Height:        Intake/Output  Summary (Last 24 hours) at 01/24/17 0941 Last data filed at 01/24/17 0900  Gross per 24 hour  Intake          2621.67 ml  Output              513 ml  Net          2108.67 ml   Filed Weights   01/19/17 0400 01/21/17 0500 01/23/17 0500  Weight: 205 lb 7.5 oz (93.2 kg) 205 lb 11 oz (93.3 kg) 203 lb 14.8 oz (92.5 kg)    Telemetry    Normal sinus rhythm with PVCs - Personally Reviewed  ECG    None - Personally Reviewed  Physical Exam   GEN: No acute distress.   Neck: 6 cm JVD Cardiac: RRR, no murmurs, rubs, or gallops.  Respiratory: Clear to auscultation bilaterally. GI: Soft, nontender, non-distended  MS: No edema; No deformity. Neuro:  Nonfocal  Psych: Normal affect   Labs    Chemistry Recent Labs Lab 01/22/17 0353 01/23/17 0528 01/24/17 0330  NA 141 140 137  K 3.5 3.4* 4.3  CL 106 106 103  CO2 26 25 24   GLUCOSE 127* 82 169*  BUN 11 8 9   CREATININE 0.99 0.99 1.11*  CALCIUM 8.3* 8.2* 8.2*  PROT  --   --  5.8*  ALBUMIN  --   --  2.9*  AST  --   --  20  ALT  --   --  29  ALKPHOS  --   --  69  BILITOT  --   --  0.9  GFRNONAA 53* 53* 46*  GFRAA >60 >60 54*  ANIONGAP 9 9 10      Hematology Recent Labs Lab 01/22/17 1139 01/23/17 0528 01/24/17 0330  WBC 14.0* 13.1* 12.3*  RBC 3.46* 3.60* 3.51*  HGB 10.7* 11.0* 10.7*  HCT 33.5* 34.8* 33.8*  MCV 96.8 96.7 96.3  MCH 30.9 30.6 30.5  MCHC 31.9 31.6 31.7  RDW 13.8 13.7 13.8  PLT 404* 493* 499*    Cardiac EnzymesNo results for input(s): TROPONINI in the last 168 hours. No results for input(s): TROPIPOC in the last 168 hours.   BNPNo results for input(s): BNP, PROBNP in the last 168 hours.   DDimer No results for input(s): DDIMER in the last 168 hours.   Radiology    No results found.  Cardiac Studies   None  Patient Profile     81 y.o. female with long-standing hypertension, status post Whipple procedure, with difficult to control hypertension and breakthrough SVT  Assessment & Plan    1.  SVT - she has had no recurrent episodes in the last 24 hours. No additional changes in treatment. 2. Hypertensive heart disease - the patient is still on intravenous nicardipine. When she is able to tolerate oral intake, we can switch her to oral antihypertensive medications.  Signed, Cristopher Peru, MD  01/24/2017, 9:41 AM  Patient ID: [name], female   DOB: 05-Dec-1935, 81 y.o.   MRN: [id number]

## 2017-01-24 NOTE — Progress Notes (Signed)
Spoke with pt regarding PICC to replace CVC RIJ.  States prefers to wait to sign until son arrives.  RN notified.

## 2017-01-24 NOTE — Plan of Care (Signed)
Problem: Bowel/Gastric: Goal: Gastrointestinal status for postoperative course will improve Outcome: Not Progressing Patient with post-op complication of ileus. C/o nausea. TNA started 6/9.  Problem: Respiratory: Goal: Ability to achieve and maintain a regular respiratory rate will improve Outcome: Progressing Patient on RA, working well with incentive spirometer.   Problem: Pain Management: Goal: General experience of comfort will improve Outcome: Progressing Mild complaints of abdominal pain, PCA use encouraged.

## 2017-01-24 NOTE — Progress Notes (Signed)
12 Days Post-Op   Subjective/Chief Complaint: CC up in the chair, nausea somewhat better, very small BM after suppository   Objective: Vital signs in last 24 hours: Temp:  [98.6 F (37 C)-99.1 F (37.3 C)] 99.1 F (37.3 C) (06/10 0400) Pulse Rate:  [57-76] 58 (06/10 0700) Resp:  [15-26] 18 (06/10 0700) BP: (118-179)/(46-135) 140/50 (06/10 0700) SpO2:  [94 %-100 %] 98 % (06/10 0700) Last BM Date: 01/23/17  Intake/Output from previous day: 06/09 0701 - 06/10 0700 In: 2583.1 [P.O.:90; I.V.:2243.1; IV Piggyback:250] Out: 908 [Urine:875; Drains:32; Stool:1] Intake/Output this shift: No intake/output data recorded.  General appearance: cooperative Resp: clear to auscultation bilaterally Cardio: regular rate and rhythm GI: soft, incision CDI, drains serous  Lab Results:   Recent Labs  01/23/17 0528 01/24/17 0330  WBC 13.1* 12.3*  HGB 11.0* 10.7*  HCT 34.8* 33.8*  PLT 493* 499*   BMET  Recent Labs  01/23/17 0528 01/24/17 0330  NA 140 137  K 3.4* 4.3  CL 106 103  CO2 25 24  GLUCOSE 82 169*  BUN 8 9  CREATININE 0.99 1.11*  CALCIUM 8.2* 8.2*   PT/INR No results for input(s): LABPROT, INR in the last 72 hours. ABG No results for input(s): PHART, HCO3 in the last 72 hours.  Invalid input(s): PCO2, PO2  Studies/Results: No results found.  Anti-infectives: Anti-infectives    Start     Dose/Rate Route Frequency Ordered Stop   01/12/17 1530  ceFAZolin (ANCEF) IVPB 2g/100 mL premix     2 g 200 mL/hr over 30 Minutes Intravenous Every 8 hours 01/12/17 1521 01/12/17 2312   01/12/17 0736  ceFAZolin (ANCEF) 2-4 GM/100ML-% IVPB    Comments:  [name]   : cabinet override      01/12/17 0736 01/12/17 0801   01/12/17 0622  ceFAZolin (ANCEF) IVPB 2g/100 mL premix     2 g 200 mL/hr over 30 Minutes Intravenous On call to O.R. 01/12/17 0622 01/12/17 1158      Assessment/Plan: s/p Procedure(s): LAPAROSCOPY DIAGNOSTIC (N/A) WHIPPLE PROCEDURE (N/A) 01/13/27  ([facility]er[facility]) FEN - ileus, NPO plus sips with meds Protein calorie malnutrition - TNA Deconditioning - PT/OT HTN - cardene, appreciate cardiology assistance VTE - Lovenox  LOS: 12 days    Shuntavia Yerby E 01/24/2017

## 2017-01-25 LAB — GLUCOSE, CAPILLARY
Glucose-Capillary: 162 mg/dL — ABNORMAL HIGH (ref 65–99)
Glucose-Capillary: 170 mg/dL — ABNORMAL HIGH (ref 65–99)
Glucose-Capillary: 197 mg/dL — ABNORMAL HIGH (ref 65–99)
Glucose-Capillary: 229 mg/dL — ABNORMAL HIGH (ref 65–99)
Glucose-Capillary: 237 mg/dL — ABNORMAL HIGH (ref 65–99)
Glucose-Capillary: 238 mg/dL — ABNORMAL HIGH (ref 65–99)
Glucose-Capillary: 259 mg/dL — ABNORMAL HIGH (ref 65–99)

## 2017-01-25 LAB — COMPREHENSIVE METABOLIC PANEL
ALT: 23 U/L (ref 14–54)
AST: 18 U/L (ref 15–41)
Albumin: 2.8 g/dL — ABNORMAL LOW (ref 3.5–5.0)
Alkaline Phosphatase: 62 U/L (ref 38–126)
Anion gap: 6 (ref 5–15)
BUN: 16 mg/dL (ref 6–20)
CO2: 25 mmol/L (ref 22–32)
Calcium: 8.4 mg/dL — ABNORMAL LOW (ref 8.9–10.3)
Chloride: 105 mmol/L (ref 101–111)
Creatinine, Ser: 1.19 mg/dL — ABNORMAL HIGH (ref 0.44–1.00)
GFR calc Af Amer: 49 mL/min — ABNORMAL LOW (ref >60)
GFR calc non Af Amer: 43 mL/min — ABNORMAL LOW (ref >60)
Glucose, Bld: 181 mg/dL — ABNORMAL HIGH (ref 65–99)
Potassium: 4.3 mmol/L (ref 3.5–5.1)
Sodium: 136 mmol/L (ref 135–145)
Total Bilirubin: 0.4 mg/dL (ref 0.3–1.2)
Total Protein: 5.8 g/dL — ABNORMAL LOW (ref 6.5–8.1)

## 2017-01-25 LAB — CBC
HCT: 33.8 % — ABNORMAL LOW (ref 36.0–46.0)
Hemoglobin: 10.7 g/dL — ABNORMAL LOW (ref 12.0–15.0)
MCH: 30.9 pg (ref 26.0–34.0)
MCHC: 31.7 g/dL (ref 30.0–36.0)
MCV: 97.7 fL (ref 78.0–100.0)
Platelets: 508 10*3/uL — ABNORMAL HIGH (ref 150–400)
RBC: 3.46 MIL/uL — ABNORMAL LOW (ref 3.87–5.11)
RDW: 14 % (ref 11.5–15.5)
WBC: 10.4 10*3/uL (ref 4.0–10.5)

## 2017-01-25 LAB — DIFFERENTIAL
Basophils Absolute: 0 10*3/uL (ref 0.0–0.1)
Basophils Relative: 0 %
Eosinophils Absolute: 0.2 10*3/uL (ref 0.0–0.7)
Eosinophils Relative: 2 %
Lymphocytes Relative: 23 %
Lymphs Abs: 2.3 10*3/uL (ref 0.7–4.0)
Monocytes Absolute: 0.6 10*3/uL (ref 0.1–1.0)
Monocytes Relative: 6 %
Neutro Abs: 7.2 10*3/uL (ref 1.7–7.7)
Neutrophils Relative %: 69 %

## 2017-01-25 LAB — PREALBUMIN: Prealbumin: 14.7 mg/dL — ABNORMAL LOW (ref 18–38)

## 2017-01-25 LAB — MAGNESIUM: Magnesium: 1.9 mg/dL (ref 1.7–2.4)

## 2017-01-25 LAB — PHOSPHORUS: Phosphorus: 3.1 mg/dL (ref 2.5–4.6)

## 2017-01-25 LAB — TRIGLYCERIDES: Triglycerides: 117 mg/dL (ref <150)

## 2017-01-25 MED ORDER — CHLORHEXIDINE GLUCONATE CLOTH 2 % EX PADS
6.0000 | MEDICATED_PAD | Freq: Every day | CUTANEOUS | Status: DC
  Administered 2017-01-29 – 2017-02-05 (×6): 6 via TOPICAL

## 2017-01-25 MED ORDER — WHITE PETROLATUM GEL
Status: AC
  Filled 2017-01-25: qty 1

## 2017-01-25 MED ORDER — METOCLOPRAMIDE HCL 5 MG/ML IJ SOLN
2.5000 mg | Freq: Three times a day (TID) | INTRAMUSCULAR | Status: DC
  Administered 2017-01-25 – 2017-01-26 (×6): 2.5 mg via INTRAVENOUS
  Filled 2017-01-25 (×6): qty 2

## 2017-01-25 MED ORDER — SODIUM CHLORIDE 0.9% FLUSH
10.0000 mL | Freq: Two times a day (BID) | INTRAVENOUS | Status: DC
  Administered 2017-01-25 – 2017-02-05 (×9): 10 mL

## 2017-01-25 MED ORDER — PANTOPRAZOLE SODIUM 40 MG PO TBEC
40.0000 mg | DELAYED_RELEASE_TABLET | Freq: Every day | ORAL | Status: DC
  Administered 2017-01-25 – 2017-02-04 (×11): 40 mg via ORAL
  Filled 2017-01-25 (×11): qty 1

## 2017-01-25 MED ORDER — SODIUM CHLORIDE 0.9% FLUSH
10.0000 mL | INTRAVENOUS | Status: DC | PRN
  Administered 2017-02-03 – 2017-02-05 (×2): 10 mL
  Filled 2017-01-25 (×2): qty 40

## 2017-01-25 MED ORDER — TRACE MINERALS CR-CU-MN-SE-ZN 10-1000-500-60 MCG/ML IV SOLN
INTRAVENOUS | Status: AC
  Administered 2017-01-25: 17:00:00 via INTRAVENOUS
  Filled 2017-01-25: qty 1920

## 2017-01-25 MED ORDER — INSULIN ASPART 100 UNIT/ML ~~LOC~~ SOLN
0.0000 [IU] | SUBCUTANEOUS | Status: DC
Start: 2017-01-25 — End: 2017-01-31
  Administered 2017-01-25: 4 [IU] via SUBCUTANEOUS
  Administered 2017-01-25: 7 [IU] via SUBCUTANEOUS
  Administered 2017-01-25: 11 [IU] via SUBCUTANEOUS
  Administered 2017-01-26 (×3): 7 [IU] via SUBCUTANEOUS
  Administered 2017-01-26: 4 [IU] via SUBCUTANEOUS
  Administered 2017-01-26: 15 [IU] via SUBCUTANEOUS
  Administered 2017-01-26: 7 [IU] via SUBCUTANEOUS
  Administered 2017-01-27 (×2): 4 [IU] via SUBCUTANEOUS
  Administered 2017-01-27 (×2): 7 [IU] via SUBCUTANEOUS
  Administered 2017-01-27 (×2): 4 [IU] via SUBCUTANEOUS
  Administered 2017-01-27: 7 [IU] via SUBCUTANEOUS
  Administered 2017-01-28: 3 [IU] via SUBCUTANEOUS
  Administered 2017-01-28 (×3): 4 [IU] via SUBCUTANEOUS
  Administered 2017-01-28 – 2017-01-30 (×8): 3 [IU] via SUBCUTANEOUS

## 2017-01-25 NOTE — NC FL2 (Signed)
[place] LEVEL OF CARE SCREENING TOOL     IDENTIFICATION  Patient Name: Kaylee Mullins Birthdate: [birth year] Sex: female Admission Date (Current Location): 01/12/2017  [facility] and [place] Number:  Herbalist and Address:  [facility] [place], [place] [address], [place], [place] [address]      Provider Number: [id number]  Attending Physician Name and Address:  Stark Klein, MD  Relative Name and Phone Number:       Current Level of Care: Hospital Recommended Level of Care: [place] Prior Approval Number:    Date Approved/Denied:   PASRR Number: [id number]  Discharge Plan: SNF    Current Diagnoses: Patient Active Problem List   Diagnosis Date Noted  . Adenocarcinoma ([place]) 01/12/2017  . 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 Essential hypertension 02/11/2016  . Lumbar stenosis with neurogenic claudication 02/10/2016  . Hyperlipidemia 02/10/2016  . Hypothyroidism 02/10/2016  . Musculoskeletal neck pain 12/13/2014  . Muscular pain 12/13/2014  . Numbness   . Acute CVA (cerebrovascular accident) ([place]) 09/16/2014  . Right renal mass 09/13/2014  . Cerebral infarction due to unspecified mechanism   . CVA (cerebral infarction) 09/11/2014  . Osteoarthritis 09/11/2014  . Postsurgical hypothyroidism 09/11/2014  . Uncontrolled hypertension 09/11/2014  . History of CVA (cerebral vascular accident) ([place]) 09/11/2014  . Diabetes mellitus without complication ([place]) 72/53/6644  . Dyslipidemia 04/20/2007    Orientation RESPIRATION BLADDER Height & Weight     Self, Time, Situation, Place  Normal Continent Weight: 204 lb 12.9 oz (92.9 kg) Height:  5\' 4"  (162.6 cm)  BEHAVIORAL SYMPTOMS/MOOD NEUROLOGICAL BOWEL NUTRITION STATUS      Continent Diet (see DC summary)  AMBULATORY STATUS COMMUNICATION OF NEEDS Skin   Limited Assist Verbally Normal                       Personal Care Assistance Level of Assistance   Bathing, Dressing Bathing Assistance: Limited assistance   Dressing Assistance: Limited assistance     Functional Limitations Info             SPECIAL CARE FACTORS FREQUENCY  PT (By licensed PT), OT (By licensed OT)     PT Frequency: 5/wk OT Frequency: 5/wk            Contractures      Additional Factors Info  Code Status, Allergies Code Status Info: FULL Allergies Info: Hydralazine Hcl, Darvon Propoxyphene Hcl, Nyquil Multi-symptom Pseudoeph-doxylamine-dm-apap, Metformin And Related           Current Medications (01/25/2017):  This is the current hospital active medication list Current Facility-Administered Medications  Medication Dose Route Frequency Provider Last Rate Last Dose  . Marland KitchenTPN (CLINIMIX-E) Adult   Intravenous Continuous TPN Priscella Mann, RPH 60 mL/hr at 01/25/17 1500    . Marland KitchenTPN (CLINIMIX-E) Adult   Intravenous Continuous TPN Bajbus, Lauren D, RPH      . 0.9 % NaCl with KCl 20 mEq/ L  infusion   Intravenous Continuous Sloan Leiter B, RPH 10 mL/hr at 01/25/17 1500    . acetaminophen (TYLENOL) tablet 650 mg  650 mg Oral Q6H PRN Stark Klein, MD   650 mg at 01/20/17 0410   Or  . acetaminophen (TYLENOL) suppository 650 mg  650 mg Rectal Q6H PRN Stark Klein, MD      . albuterol (PROVENTIL) (2.5 MG/3ML) 0.083% nebulizer solution 2.5 mg  2.5 mg Nebulization Q4H PRN Stark Klein, MD   2.5 mg at  01/15/17 1205  . amLODipine (NORVASC) tablet 10 mg  10 mg Oral Daily Minus Breeding, MD   10 mg at 01/25/17 0927  . bisacodyl (DULCOLAX) suppository 10 mg  10 mg Rectal Daily PRN Romana Juniper A, MD   10 mg at 01/25/17 1155  . chlorhexidine (PERIDEX) 0.12 % solution 15 mL  15 mL Mouth Rinse BID Romana Juniper A, MD   15 mL at 01/25/17 0927  . Chlorhexidine Gluconate Cloth 2 % PADS 6 each  6 each Topical Daily Stark Klein, MD   6 each at 01/25/17 [phone]  . Chlorhexidine Gluconate Cloth 2 % PADS 6 each  6 each Topical Daily Stark Klein, MD      . cloNIDine  (CATAPRES - Dosed in mg/24 hr) patch 0.1 mg  0.1 mg Transdermal Q Reynaldo Minium, MD   0.1 mg at 01/22/17 0853  . diphenhydrAMINE (BENADRYL) 12.5 MG/5ML elixir 12.5 mg  12.5 mg Oral Q6H PRN Stark Klein, MD       Or  . diphenhydrAMINE (BENADRYL) injection 12.5 mg  12.5 mg Intravenous Q6H PRN Stark Klein, MD   12.5 mg at 01/13/17 2005  . enoxaparin (LOVENOX) injection 40 mg  40 mg Subcutaneous Q24H Georganna Skeans, MD   40 mg at 01/25/17 0927  . fentaNYL (SUBLIMAZE) injection 12.5-50 mcg  12.5-50 mcg Intravenous Q2H PRN Stark Klein, MD   50 mcg at 01/19/17 0414  . fentaNYL 40 mcg/mL PCA injection   Intravenous Q4H Duane Boston, MD      . furosemide (LASIX) tablet 40 mg  40 mg Oral Daily Georganna Skeans, MD   40 mg at 01/25/17 0927  . irbesartan (AVAPRO) tablet 300 mg  300 mg Oral Daily Stark Klein, MD   300 mg at 01/25/17 0926   And  . hydrochlorothiazide (HYDRODIURIL) tablet 25 mg  25 mg Oral Daily Stark Klein, MD   25 mg at 01/25/17 0927  . insulin aspart (novoLOG) injection 0-20 Units  0-20 Units Subcutaneous Q4H Bajbus, Lauren D, RPH   7 Units at 01/25/17 1152  . insulin glargine (LANTUS) injection 30 Units  30 Units Subcutaneous QHS Priscella Mann, RPH   30 Units at 01/24/17 2136  . levothyroxine (SYNTHROID, LEVOTHROID) tablet 200 mcg  200 mcg Oral QAC breakfast Georganna Skeans, MD   200 mcg at 01/25/17 0801  . MEDLINE mouth rinse  15 mL Mouth Rinse q12n4p Romana Juniper A, MD   15 mL at 01/25/17 1158  . metoCLOPramide (REGLAN) injection 2.5 mg  2.5 mg Intravenous Q8H Stark Klein, MD   2.5 mg at 01/25/17 1444  . metoprolol tartrate (LOPRESSOR) tablet 50 mg  50 mg Oral BID Minus Breeding, MD   50 mg at 01/25/17 4431  . naloxone [facility]) injection 0.4 mg  0.4 mg Intravenous PRN Duane Boston, MD       And  . sodium chloride flush (NS) 0.9 % injection 9 mL  9 mL Intravenous PRN Duane Boston, MD      . nicardipine (CARDENE) 40mg  in 0.83% saline 234ml IV DOUBLE STRENGTH  infusion (0.2 mg/ml)  3-15 mg/hr Intravenous Continuous Minus Breeding, MD   Stopped at 01/24/17 1005  . NIFEdipine (PROCARDIA-XL/ADALAT CC) 24 hr tablet 30 mg  30 mg Oral BID PRN Josue Hector, MD      . ondansetron (ZOFRAN-ODT) disintegrating tablet 4 mg  4 mg Oral Q6H PRN Stark Klein, MD       Or  . ondansetron (ZOFRAN) injection 4 mg  4 mg Intravenous Q6H PRN Stark Klein, MD   4 mg at 01/25/17 1151  . oxyCODONE (Oxy IR/ROXICODONE) immediate release tablet 5-10 mg  5-10 mg Oral Q4H PRN Georganna Skeans, MD   10 mg at 01/23/17 2022  . pantoprazole (PROTONIX) EC tablet 40 mg  40 mg Oral QHS Kris Mouton, RPH      . polyethylene glycol (MIRALAX / GLYCOLAX) packet 17 g  17 g Oral Daily Romana Juniper A, MD   17 g at 01/25/17 0926  . potassium chloride SA (K-DUR,KLOR-CON) CR tablet 20 mEq  20 mEq Oral BID Georganna Skeans, MD   20 mEq at 01/25/17 0927  . promethazine (PHENERGAN) injection 12.5 mg  12.5 mg Intravenous Q6H PRN Georganna Skeans, MD   12.5 mg at 01/22/17 1655  . sodium chloride flush (NS) 0.9 % injection 10-40 mL  10-40 mL Intracatheter Q12H Stark Klein, MD   10 mL at 01/24/17 2200  . sodium chloride flush (NS) 0.9 % injection 10-40 mL  10-40 mL Intracatheter PRN Stark Klein, MD      . sodium chloride flush (NS) 0.9 % injection 10-40 mL  10-40 mL Intracatheter Q12H Stark Klein, MD      . sodium chloride flush (NS) 0.9 % injection 10-40 mL  10-40 mL Intracatheter PRN Stark Klein, MD      . spironolactone (ALDACTONE) tablet 50 mg  50 mg Oral Daily Minus Breeding, MD   50 mg at 01/25/17 0927  . white petrolatum (VASELINE) gel              Discharge Medications: Please see discharge summary for a list of discharge medications.  Relevant Imaging Results:  Relevant Lab Results:   Additional Information SSN [id number]  Jorge Ny, LCSW

## 2017-01-25 NOTE — Clinical Social Work Note (Signed)
Clinical Social Work Assessment  Patient Details  Name: Kaylee Mullins MRN: [id number] Date of Birth: May 13, 1936  Date of referral:  01/25/17               Reason for consult:  Facility Placement                Permission sought to share information with:  Facility Art therapist granted to share information::  Yes, Verbal Permission Granted  Name::        Agency::  SNF  Relationship::     Contact Information:     Housing/Transportation Living arrangements for the past 2 months:  Single Family Home Source of Information:  Patient Patient Interpreter Needed:  None Criminal Activity/Legal Involvement Pertinent to Current Situation/Hospitalization:  No - Comment as needed Significant Relationships:  Adult Children Lives with:  Self Do you feel safe going back to the place where you live?  No Need for family participation in patient care:  No (Coment)  Care giving concerns:  Pt lives at home alone- has chair lift to get upstairs and has friends who come check at her but feeling nervous about being home alone after this hospital stay and new weakness.  Has a dtr and son but they work and are not available most of the day to assist.   Facilities manager / plan:  CSW spoke with pt concerning PT recommendation for SNF.  Pt has been in the past and is familiar with process  Employment status:  Retired Nurse, adult PT Recommendations:  [place] / Referral to community resources:  [place]  Patient/Family's Response to care:  Pt agreeable to SNF stay until back to baseline functioning.  Patient/Family's Understanding of and Emotional Response to Diagnosis, Current Treatment, and Prognosis:  No questions or concerns- hopeful that she can return home soon.  Emotional Assessment Appearance:  Appears stated age Attitude/Demeanor/Rapport:    Affect (typically observed):   Appropriate Orientation:  Oriented to Self, Oriented to Place, Oriented to  Time, Oriented to Situation Alcohol / Substance use:  Not Applicable Psych involvement (Current and /or in the community):  No (Comment)  Discharge Needs  Concerns to be addressed:  Care Coordination Readmission within the last 30 days:  No Current discharge risk:  Physical Impairment Barriers to Discharge:  Continued Medical Work up   Jorge Ny, LCSW 01/25/2017, 4:06 PM

## 2017-01-25 NOTE — Care Management Note (Signed)
Case Management Note Marvetta Gibbons RN, BSN Unit 2W-Case Manager-- [place] coverage [phone]  Patient Details  Name: Kaylee Mullins MRN: [id number] Date of Birth: [birth year]  Subjective/Objective:    Pt admitted s/p whipple 5/29                Action/Plan: PTA pt lived at home- CM to follow- noted pt remains on Cardene drip on 6/5  Expected Discharge Date:                  Expected Discharge Plan:  Skilled Nursing Facility  In-House Referral:  Clinical Social Work  Discharge planning Services  CM Consult  Post Acute Care Choice:    Choice offered to:     DME Arranged:    DME Agency:     HH Arranged:    [place] Agency:     Status of Service:  In process, will continue to follow  If discussed at Long Length of Stay Meetings, dates discussed:  6/5, 6/7, 6/12  Discharge Disposition:   Additional Comments:  01/25/17- 1530- Marvetta Gibbons RN, CM- pt off Cardene drip now, taking some clears, remains on TPN, per PT/OT evals recommendations for SNF- CSW has been consulted. Pt to tx out of ICU.   Dawayne Patricia, RN 01/25/2017, 3:35 PM

## 2017-01-25 NOTE — Progress Notes (Signed)
PT Cancellation Note  Patient Details Name: Kaylee Mullins MRN: [id number] DOB: 11/28/1935   Cancelled Treatment:    Reason Eval/Treat Not Completed: Patient at procedure or test/unavailable.  Pt was having PICC line placed.  PT will check back later today or tomorrow as time allows.  Thanks,    Barbarann Ehlers. [place], [place], DPT [phone]

## 2017-01-25 NOTE — Progress Notes (Signed)
[facility] CONSULT NOTE   Pharmacy Consult for TPN Indication: Ileus  Patient Measurements: Height: 5\' 4"  (162.6 cm) Weight: 204 lb 12.9 oz (92.9 kg) IBW/kg (Calculated) : 54.7 TPN AdjBW (KG): 64.2 Body mass index is 35.16 kg/m.  Assessment: 81 year old female with severe malnutrition related to renal cell and pancreatic cancer who presented with abdominal mass on 01/12/17 s/p whipple procedure same day now with ileus unable to keep down orals and significant N/V.   GI: 2 Abdominal drains- 29mL out in last 24h. NGT removed 6/1. LBM 6/9 after suppository.  Endo: CBGs 181-239 (increased w/ start of TPN) Insulin requirements in the past 24 hours: 28 units SSI, 30 units Lantus qHS. Lytes: K 4.3 on scheduled KCl 20 BID (on Lasix 40 po daily; Spironolactone). Mg 1.9. Phos 3.1  Renal: SCr up 1.19 (baseline ~ 0.8). UOP documented 0.12mL/kg/hr. Pulm: RA Cards: BP improved- now off Nicardipine drip. HR can be tachy up to 115 -Lasix + Spironolactone, Amlodipine, Irbesartan + HCTZ, Metoprolol Hepatobil: LFTs elevated on 6/2, now down to wnl. Tbili wnl. PreAlb 14.7. Trigs 11 Neuro: hx CVA 2016 w/ residual L-hand and foot weakness. Fentanyl PCA ID: WBC 13.1>>12.3, Afebrile  Best Practices: Lovenox 40, PPI IV TPN Access: Double lumen CVC (plan to change to PICC- patient wanting to speak with her son and Dr. [name] before consenting) TPN start date: 01/23/17  Nutritional Goals (per RD recommendation on 6/8): KCal: 2000-2200 Protein: 110-120g   Current Nutrition:  NPO, sips with meds TPN - Clinimix E 5/15 to 21ml/hr  Plan:  -Increase Clinimix E 5/15 to 32ml/hr- provides 96g of protein and 1363 kCals per day meeting 80% of protein and 62% of kCal needs. -Daily MVI in TPN;  Due to national shortage, trace elements every other day in TPN- next due today (6/11) -Hold 20% lipid emulsion for first 7 days for ICU patients per ASPEN guidelines (Start date for lipids will  be 01/30/17) -Continue IVMF (NS +20K) at [facility] - changes can be made by MD if warrented -Change SSI to resistant- continue at q4h and adjust as needed. Continus Lantus 30 units qhs for now. If poor glycemic control persists, will add insulin to TPN bag -BMET + mag and phos in the morning -Follow-up change to triple lumen with TPN as TPN must be infused by itself and Lipids to be started end of next week.   Jemarion Roycroft D. Haleemah Buckalew, PharmD, BCPS Clinical Pharmacist Pager: [phone] Clinical Phone for 01/25/2017 until 3:30pm: [phone] If after 3:30pm, please call main pharmacy at [phone] 01/25/2017 8:50 AM

## 2017-01-25 NOTE — Progress Notes (Addendum)
Progress Note  Patient Name: Kaylee Mullins Date of Encounter: 01/25/2017  Primary Cardiologist: Dr. Harrington Challenger  Subjective   Still complains of nausea. Had a short burst of SVT yesterday am  Inpatient Medications    Scheduled Meds: . amLODipine  10 mg Oral Daily  . chlorhexidine  15 mL Mouth Rinse BID  . Chlorhexidine Gluconate Cloth  6 each Topical Daily  . cloNIDine  0.1 mg Transdermal Q Fri  . enoxaparin (LOVENOX) injection  40 mg Subcutaneous Q24H  . fentaNYL   Intravenous Q4H  . furosemide  40 mg Oral Daily  . irbesartan  300 mg Oral Daily   And  . hydrochlorothiazide  25 mg Oral Daily  . insulin aspart  0-20 Units Subcutaneous Q4H  . insulin glargine  30 Units Subcutaneous QHS  . levothyroxine  200 mcg Oral QAC breakfast  . mouth rinse  15 mL Mouth Rinse q12n4p  . metoCLOPramide (REGLAN) injection  2.5 mg Intravenous Q8H  . metoprolol tartrate  50 mg Oral BID  . pantoprazole  40 mg Oral QHS  . polyethylene glycol  17 g Oral Daily  . potassium chloride  20 mEq Oral BID  . sodium chloride flush  10-40 mL Intracatheter Q12H  . spironolactone  50 mg Oral Daily   Continuous Infusions: . Marland KitchenTPN (CLINIMIX-E) Adult 60 mL/hr at 01/25/17 0800  . Marland KitchenTPN (CLINIMIX-E) Adult    . 0.9 % NaCl with KCl 20 mEq / L 10 mL/hr at 01/25/17 0800  . niCARDipine Stopped (01/24/17 1005)   PRN Meds: acetaminophen **OR** acetaminophen, albuterol, bisacodyl, diphenhydrAMINE **OR** diphenhydrAMINE, fentaNYL (SUBLIMAZE) injection, naloxone **AND** sodium chloride flush, NIFEdipine, ondansetron **OR** ondansetron (ZOFRAN) IV, oxyCODONE, promethazine, sodium chloride flush   Vital Signs    Vitals:   01/25/17 0630 01/25/17 0700 01/25/17 0759 01/25/17 0800  BP: (!) 130/54 (!) 136/55  137/67  Pulse: (!) 53 (!) 53  (!) 55  Resp: 15 14 20 18   Temp:  98.9 F (37.2 C)    TempSrc:  Oral    SpO2: 100% 100% 100% 100%  Weight:      Height:        Intake/Output Summary (Last 24 hours) at 01/25/17  0908 Last data filed at 01/25/17 0800  Gross per 24 hour  Intake          1786.17 ml  Output             1627 ml  Net           159.17 ml   Filed Weights   01/21/17 0500 01/23/17 0500 01/25/17 0530  Weight: 205 lb 11 oz (93.3 kg) 203 lb 14.8 oz (92.5 kg) 204 lb 12.9 oz (92.9 kg)    Telemetry    NSR with short burst of SVT, PACs- Personally Reviewed  ECG    No new EKG - Personally Reviewed  Physical Exam   GEN: WD, WN in NAD Neck: no JVD, bruit Cardiac: RRR no M/R/G Respiratory: CTA bilaterally GI: soft, NT, ND with active BS MS: no edema Neuro:  A&O x 3 Psych: normal affect  Labs    Chemistry  Recent Labs Lab 01/23/17 0528 01/24/17 0330 01/25/17 0354  NA 140 137 136  K 3.4* 4.3 4.3  CL 106 103 105  CO2 25 24 25   GLUCOSE 82 169* 181*  BUN 8 9 16   CREATININE 0.99 1.11* 1.19*  CALCIUM 8.2* 8.2* 8.4*  PROT  --  5.8* 5.8*  ALBUMIN  --  2.9* 2.8*  AST  --  20 18  ALT  --  29 23  ALKPHOS  --  69 62  BILITOT  --  0.9 0.4  GFRNONAA 53* 46* 43*  GFRAA >60 54* 49*  ANIONGAP 9 10 6      Hematology  Recent Labs Lab 01/23/17 0528 01/24/17 0330 01/25/17 0354  WBC 13.1* 12.3* 10.4  RBC 3.60* 3.51* 3.46*  HGB 11.0* 10.7* 10.7*  HCT 34.8* 33.8* 33.8*  MCV 96.7 96.3 97.7  MCH 30.6 30.5 30.9  MCHC 31.6 31.7 31.7  RDW 13.7 13.8 14.0  PLT 493* 499* 508*    Cardiac EnzymesNo results for input(s): TROPONINI in the last 168 hours. No results for input(s): TROPIPOC in the last 168 hours.   BNPNo results for input(s): BNP, PROBNP in the last 168 hours.   DDimer No results for input(s): DDIMER in the last 168 hours.   Radiology    No results found.  Cardiac Studies   None  Patient Profile     81 y.o. female with long-standing hypertension, status post Whipple procedure, with difficult to control hypertension and breakthrough SVT  Assessment & Plan    1. SVT - she had a very short burst of SVT yesterday am but none since.  Now taking PO.  Continue  metoprolol.   2. Hypertensive heart disease - the patient had been on intravenous nicardipine but now back on PO BP meds.  BP well controlled on Toprol XL 50mg  daily, amlodipine 10mg  daily, avapro 300mg  daily, aldactone 50mg  daily and HCTZ 25mg  daily.   No other recs at this time.  Will sign off.  Call with any questions.    Signed, Fransico Him, MD  01/25/2017, 9:08 AM  Patient ID: Kaylee Mullins, female   DOB: 03-07-1936, 81 y.o.   MRN: [id number]

## 2017-01-25 NOTE — Progress Notes (Signed)
Inpatient Diabetes Program Recommendations  AACE/ADA: New Consensus Statement on Inpatient Glycemic Control (2015)  Target Ranges:  Prepandial:   less than 140 mg/dL      Peak postprandial:   less than 180 mg/dL (1-2 hours)      Critically ill patients:  140 - 180 mg/dL   Lab Results  Component Value Date   GLUCAP 238 (H) 01/25/2017   HGBA1C 9.9 (H) 01/04/2017    Review of Glycemic Control Results for Kaylee Mullins, Kaylee Mullins (MRN [id number]) as of 01/25/2017 08:24  Ref. Range 01/24/2017 16:57 01/24/2017 21:01 01/25/2017 00:08 01/25/2017 04:14 01/25/2017 07:31  Glucose-Capillary Latest Ref Range: 65 - 99 mg/dL 215 (H) 239 (H) 237 (H) 170 (H) 238 (H)    Inpatient Diabetes Program Recommendations:    Please consider: -Novolog 2 units q 4 hrs tube feed coverage and hold if tubefeeding held or stopped  Thank you, Bethena Roys E. Kaitlyn Franko, RN, MSN, CDE  Diabetes Coordinator Inpatient Glycemic Control Team Team Pager [phone] (8am-5pm) 01/25/2017 8:26 AM

## 2017-01-25 NOTE — Progress Notes (Signed)
Peripherally Inserted Central Catheter/Midline Placement  The IV Nurse has discussed with the patient and/or persons authorized to consent for the patient, the purpose of this procedure and the potential benefits and risks involved with this procedure.  The benefits include less needle sticks, lab draws from the catheter, and the patient may be discharged home with the catheter. Risks include, but not limited to, infection, bleeding, blood clot (thrombus formation), and puncture of an artery; nerve damage and irregular heartbeat and possibility to perform a PICC exchange if needed/ordered by physician.  Alternatives to this procedure were also discussed.  Bard Power PICC patient education guide, fact sheet on infection prevention and patient information card has been provided to patient /or left at bedside.    PICC/Midline Placement Documentation  PICC Triple Lumen 01/25/17 PICC Right Brachial 42 cm (Active)  Indication for Insertion or Continuance of Line Administration of hyperosmolar/irritating solutions (i.e. TPN, Vancomycin, etc.) 01/25/2017 10:00 AM  Dressing Change Due 02/01/17 01/25/2017 10:00 AM       [name] 01/25/2017, 10:28 AM

## 2017-01-25 NOTE — Progress Notes (Signed)
13 Days Post-Op   Subjective/Chief Complaint: Still some nausea.  No emesis.  No BM yesterday.     Objective: Vital signs in last 24 hours: Temp:  [97.8 F (36.6 C)-98.9 F (37.2 C)] 98.9 F (37.2 C) (06/11 0700) Pulse Rate:  [48-115] 55 (06/11 0800) Resp:  [14-22] 18 (06/11 0800) BP: (115-155)/(50-102) 137/67 (06/11 0800) SpO2:  [96 %-100 %] 100 % (06/11 0800) Weight:  [92.9 kg (204 lb 12.9 oz)] 92.9 kg (204 lb 12.9 oz) (06/11 0530) Last BM Date: 01/23/17  Intake/Output from previous day: 06/10 0701 - 06/11 0700 In: 2004.8 [P.O.:240; I.V.:1764.8] Out: 1657 [OTRRN:1657; Drains:82] Intake/Output this shift: Total I/O In: 70 [I.V.:70] Out: -   General appearance: cooperative Resp: clear to auscultation bilaterally Cardio: regular rate and rhythm GI: soft, incision CDI, drains serous  Lab Results:   Recent Labs  01/24/17 0330 01/25/17 0354  WBC 12.3* 10.4  HGB 10.7* 10.7*  HCT 33.8* 33.8*  PLT 499* 508*   BMET  Recent Labs  01/24/17 0330 01/25/17 0354  NA 137 136  K 4.3 4.3  CL 103 105  CO2 24 25  GLUCOSE 169* 181*  BUN 9 16  CREATININE 1.11* 1.19*  CALCIUM 8.2* 8.4*   PT/INR No results for input(s): LABPROT, INR in the last 72 hours. ABG No results for input(s): PHART, HCO3 in the last 72 hours.  Invalid input(s): PCO2, PO2  Studies/Results: No results found.  Anti-infectives: Anti-infectives    Start     Dose/Rate Route Frequency Ordered Stop   01/12/17 1530  ceFAZolin (ANCEF) IVPB 2g/100 mL premix     2 g 200 mL/hr over 30 Minutes Intravenous Every 8 hours 01/12/17 1521 01/12/17 2312   01/12/17 0736  ceFAZolin (ANCEF) 2-4 GM/100ML-% IVPB    Comments:  [name]   : cabinet override      01/12/17 0736 01/12/17 0801   01/12/17 0622  ceFAZolin (ANCEF) IVPB 2g/100 mL premix     2 g 200 mL/hr over 30 Minutes Intravenous On call to O.R. 01/12/17 0622 01/12/17 1158      Assessment/Plan: s/p Procedure(s): LAPAROSCOPY DIAGNOSTIC  (N/A) WHIPPLE PROCEDURE (N/A) 01/13/27 (Emersyn Wyss) FEN - clears if tolerated Protein calorie malnutrition - TNA Deconditioning - PT/OT HTN - off cardene at this point. appreciate cardiology assistance Transfer to stepdown. Qtc OK.  Try low dose reglan.  Recheck EKG tomorrow.  Suppository again today.  VTE - Lovenox  LOS: 13 days    Josmar Messimer 01/25/2017

## 2017-01-26 LAB — GLUCOSE, CAPILLARY
Glucose-Capillary: 245 mg/dL — ABNORMAL HIGH (ref 65–99)
Glucose-Capillary: 229 mg/dL — ABNORMAL HIGH (ref 65–99)
Glucose-Capillary: 239 mg/dL — ABNORMAL HIGH (ref 65–99)
Glucose-Capillary: 347 mg/dL — ABNORMAL HIGH (ref 65–99)
Glucose-Capillary: 214 mg/dL — ABNORMAL HIGH (ref 65–99)
Glucose-Capillary: 187 mg/dL — ABNORMAL HIGH (ref 65–99)

## 2017-01-26 LAB — BASIC METABOLIC PANEL
Anion gap: 7 (ref 5–15)
BUN: 20 mg/dL (ref 6–20)
CO2: 26 mmol/L (ref 22–32)
Calcium: 8.3 mg/dL — ABNORMAL LOW (ref 8.9–10.3)
Chloride: 100 mmol/L — ABNORMAL LOW (ref 101–111)
Creatinine, Ser: 1.13 mg/dL — ABNORMAL HIGH (ref 0.44–1.00)
GFR calc Af Amer: 53 mL/min — ABNORMAL LOW (ref >60)
GFR calc non Af Amer: 45 mL/min — ABNORMAL LOW (ref >60)
Glucose, Bld: 255 mg/dL — ABNORMAL HIGH (ref 65–99)
Potassium: 4.5 mmol/L (ref 3.5–5.1)
Sodium: 133 mmol/L — ABNORMAL LOW (ref 135–145)

## 2017-01-26 LAB — PHOSPHORUS: Phosphorus: 3.3 mg/dL (ref 2.5–4.6)

## 2017-01-26 LAB — MAGNESIUM: Magnesium: 1.9 mg/dL (ref 1.7–2.4)

## 2017-01-26 MED ORDER — M.V.I. ADULT IV INJ
INTRAVENOUS | Status: AC
  Administered 2017-01-26: 18:00:00 via INTRAVENOUS
  Filled 2017-01-26: qty 1920

## 2017-01-26 MED ORDER — INSULIN GLARGINE 100 UNIT/ML ~~LOC~~ SOLN
34.0000 [IU] | Freq: Every day | SUBCUTANEOUS | Status: DC
  Administered 2017-01-26: 34 [IU] via SUBCUTANEOUS
  Filled 2017-01-26: qty 0.34

## 2017-01-26 NOTE — Progress Notes (Signed)
Occupational Therapy Treatment Patient Details Name: CATLIN DORIA MRN: [id number] DOB: 1935/10/22 Today's Date: 01/26/2017    History of present illness This 81 y.o female admitted with cystic distal pancreatic mass.  She underwent  whipple procedure.   Biopsy + for adinocarcinoma.  PMH includes:  CVA, spinal stenosis, PNA, DM, depression, CKD   OT comments  Pt demonstrating progress toward OT goals. She was able to complete stand-pivot toilet transfers with min guard assist and toileting hygiene with min assist this session. She was additionally able to complete seated LB bathing tasks this session with min assist. She continues to report abdominal pain with movement. Following seated grooming task to brush teeth with set-up, pt with emesis x2 and RN notified. HR elevated during ADL participation up to 120's. Continue to recommend SNF for rehabilitation post-acute D/C. OT will continue to follow while admitted.   Follow Up Recommendations  SNF;Supervision/Assistance - 24 hour    Equipment Recommendations  3 in 1 bedside commode    Recommendations for Other Services      Precautions / Restrictions Precautions Precautions: Fall Precaution Comments: monitor BP and vitals Restrictions Weight Bearing Restrictions: No       Mobility Bed Mobility               General bed mobility comments: OOB in chair on arrival   Transfers Overall transfer level: Needs assistance Equipment used: 1 person hand held assist Transfers: Stand Pivot Transfers;Sit to/from Stand Sit to Stand: Min guard Stand pivot transfers: Min guard       General transfer comment: Min guard assist for safety.     Balance Overall balance assessment: Needs assistance Sitting-balance support: Feet supported;No upper extremity supported Sitting balance-Leahy Scale: Fair     Standing balance support: During functional activity;Bilateral upper extremity supported Standing balance-Leahy Scale:  Poor Standing balance comment: Reliant on single UE support in standing.                            ADL either performed or assessed with clinical judgement   ADL Overall ADL's : Needs assistance/impaired     Grooming: Wash/dry face;Oral care;Sitting;Set up       Lower Body Bathing: Minimal assistance;Sitting/lateral leans   Upper Body Dressing : Minimal assistance;Sitting       Toilet Transfer: Min guard;Stand-pivot;BSC;RW   Toileting- Clothing Manipulation and Hygiene: Minimal assistance;Sit to/from stand       Functional mobility during ADLs: Min guard;Rolling walker General ADL Comments: Pt hesitant to participate with session but with increased willingness as she began to complete ADL.      Vision       Perception     Praxis      Cognition Arousal/Alertness: Awake/alert Behavior During Therapy: WFL for tasks assessed/performed Overall Cognitive Status: Within Functional Limits for tasks assessed                                          Exercises     Shoulder Instructions       General Comments HR elevated to 120s at one point during session.     Pertinent Vitals/ Pain       Pain Assessment: Faces Faces Pain Scale: Hurts even more Pain Location: abdomen  Pain Descriptors / Indicators: Operative site guarding;Discomfort Pain Intervention(s): Monitored during session;Repositioned  Home Living  Prior Functioning/Environment              Frequency  Min 2X/week        Progress Toward Goals  OT Goals(current goals can now be found in the care plan section)  Progress towards OT goals: Progressing toward goals  Acute Rehab OT Goals Patient Stated Goal: to get better  OT Goal Formulation: With patient Time For Goal Achievement: 02/04/17 Potential to Achieve Goals: Good ADL Goals Pt Will Perform Grooming: with min guard assist;standing Pt Will Perform  Upper Body Bathing: with set-up;with supervision;sitting Pt Will Perform Lower Body Bathing: with min guard assist;sit to/from stand;with adaptive equipment Pt Will Perform Upper Body Dressing: with supervision;with set-up;sitting Pt Will Perform Lower Body Dressing: with min guard assist;with adaptive equipment;sit to/from stand Pt Will Transfer to Toilet: with min guard assist;ambulating;regular height toilet;bedside commode;grab bars Pt Will Perform Toileting - Clothing Manipulation and hygiene: with min guard assist;sit to/from stand  Plan Discharge plan remains appropriate    Co-evaluation                 AM-PAC PT "6 Clicks" Daily Activity     Outcome Measure   Help from another person eating meals?: A Little Help from another person taking care of personal grooming?: A Little Help from another person toileting, which includes using toliet, bedpan, or urinal?: A Little Help from another person bathing (including washing, rinsing, drying)?: A Little Help from another person to put on and taking off regular upper body clothing?: A Little Help from another person to put on and taking off regular lower body clothing?: A Little 6 Click Score: 18    End of Session Equipment Utilized During Treatment: Gait belt;Rolling walker  OT Visit Diagnosis: Unsteadiness on feet (R26.81);Pain Pain - part of body:  (abdominal)   Activity Tolerance Patient limited by pain   Patient Left in chair;with call bell/phone within reach;with family/visitor present   Nurse Communication Mobility status        Time: 7672-0947 OT Time Calculation (min): 52 min  Charges: OT General Charges $OT Visit: 1 Procedure OT Treatments $Self Care/Home Management : 38-52 mins  [name] Herrlich, MS OTR/L  Pager: Section A Marguis Mathieson 01/26/2017, 5:51 PM

## 2017-01-26 NOTE — Progress Notes (Signed)
PHARMACY - ADULT TOTAL PARENTERAL NUTRITION CONSULT NOTE   Pharmacy Consult for TPN Indication: Ileus  Patient Measurements: Height: 5\' 4"  (162.6 cm) Weight: 204 lb 12.9 oz (92.9 kg) IBW/kg (Calculated) : 54.7 TPN AdjBW (KG): 64.2 Body mass index is 35.16 kg/m.  Assessment: 81 year old female with severe malnutrition related to renal cell and pancreatic cancer who presented with abdominal mass on 01/12/17 s/p whipple procedure same day now with ileus unable to keep down orals and significant N/V.   GI: 2 Abdominal drains-minimal output charted in last 24h. NGT removed 6/1. LBM 6/11 Endo: CBGs 197-255 (increased w/ start of TPN) Insulin requirements in the past 24 hours: 41 units SSI, 30 units Lantus qHS. Lytes: K 4.5 on scheduled KCl 20 BID (on Lasix 40 po daily; Spironolactone). Mag 1.9, Phos 3.3 Renal: SCr up 1.13 (baseline ~ 0.8). UOP 26mL/kg/hr. Pulm: RA Cards: BP improved, HR can be tachy up to 112-Lasix + spironolactone, amlodipine, irbesartan + HCTZ, Lopressor Hepatobil: LFTs elevated on 6/2, now down to wnl. Tbili wnl. PreAlb 14.7. Trigs 117 Neuro: hx CVA 2016 w/ residual L-hand and foot weakness. Fentanyl PCA ID: WBC wnl, Afebrile  Best Practices: Lovenox 40, PPI IV TPN Access: triple lumen PICC placed 6/11 TPN start date: 01/23/17  Nutritional Goals (per RD recommendation on 6/8): KCal: 2000-2200 Protein: 110-120g   Current Nutrition:  NPO, sips with meds TPN - Clinimix E 5/15 at 60ml/hr  Plan:  -Continue Clinimix E 5/15 at 78ml/hr- provides 96g of protein and 1363 kCals per day meeting 80% of protein and 62% of kCal needs. -Daily MVI in TPN, trace elements every other day- next due 6/13 -Hold 20% lipid emulsion for first 7 days for ICU patients per ASPEN guidelines (Start date for lipids will be 01/30/17) -Continue IVMF (NS +20K) at [facility] - changes can be made by MD if warrented -Add 24 units regular insulin to TPN bag. Continue resistent SSI q4h and Lantus 30 units  qhs. -BMET + mag and phos in the morning   Saylah Ketner D. Kieryn Burtis, PharmD, BCPS Clinical Pharmacist Pager: [phone] Clinical Phone for 01/26/2017 until 3:30pm: [phone] If after 3:30pm, please call main pharmacy at [phone] 01/26/2017 8:00 AM

## 2017-01-26 NOTE — Progress Notes (Signed)
Physical Therapy Treatment Patient Details Name: Kaylee Mullins MRN: [id number] DOB: [birth year] Today's Date: 01/26/2017    History of Present Illness This 81 y.o female admitted with cystic distal pancreatic mass.  She underwent  whipple procedure.   Biopsy + for adinocarcinoma.  PMH includes:  CVA, spinal stenosis, PNA, DM, depression, CKD    PT Comments    Patient progressing well towards PT goals. Tolerated gait training with Min guard assist for safety but requires standing rest breaks due to fatigue. Sp02 remained in high 90s on RA and BP stable throughout. Encouraged increasing activity during the day. Pt motivated to return to PLOF. Will continue to follow and progress as tolerated.    Follow Up Recommendations  SNF     Equipment Recommendations  3in1 (PT)    Recommendations for Other Services       Precautions / Restrictions Precautions Precautions: Fall Precaution Comments: monitor BP and vitals Restrictions Weight Bearing Restrictions: No    Mobility  Bed Mobility Overal bed mobility: Needs Assistance Bed Mobility: Rolling;Sidelying to Sit;Sit to Sidelying Rolling: Min guard Sidelying to sit: Min guard     Sit to sidelying: Min guard General bed mobility comments: Demonstrates log roll technique, use of rail for support.  Transfers Overall transfer level: Needs assistance Equipment used: Rolling walker (2 wheeled) Transfers: Sit to/from Stand Sit to Stand: Min guard         General transfer comment: Min guard for safety. NO dizziness.  Ambulation/Gait Ambulation/Gait assistance: Min assist Ambulation Distance (Feet): 150 Feet Assistive device: Rolling walker (2 wheeled) Gait Pattern/deviations: Step-through pattern;Trunk flexed;Decreased stride length Gait velocity: decreased   General Gait Details: VC's for upright posture. HR stable. SP02 high 90s on RA. 2 standing rest breaks. 2/4 DOE.   Stairs            Wheelchair Mobility     Modified Rankin (Stroke Patients Only)       Balance Overall balance assessment: Needs assistance Sitting-balance support: Feet supported;No upper extremity supported Sitting balance-Leahy Scale: Fair Sitting balance - Comments: ABle to brush hair sitting EOB without difficulty.    Standing balance support: During functional activity;Bilateral upper extremity supported Standing balance-Leahy Scale: Poor Standing balance comment: Reliant on BUEs for support in standing.                            Cognition Arousal/Alertness: Awake/alert Behavior During Therapy: WFL for tasks assessed/performed Overall Cognitive Status: Within Functional Limits for tasks assessed                                        Exercises      General Comments        Pertinent Vitals/Pain Pain Assessment: Faces Faces Pain Scale: Hurts even more Pain Location: abdomen  Pain Descriptors / Indicators: Operative site guarding;Discomfort Pain Intervention(s): Monitored during session;Repositioned    Home Living                      Prior Function            PT Goals (current goals can now be found in the care plan section) Progress towards PT goals: Progressing toward goals    Frequency    Min 3X/week      PT Plan Current plan remains appropriate    Co-evaluation  AM-PAC PT "6 Clicks" Daily Activity  Outcome Measure  Difficulty turning over in bed (including adjusting bedclothes, sheets and blankets)?: None Difficulty moving from lying on back to sitting on the side of the bed? : None Difficulty sitting down on and standing up from a chair with arms (e.g., wheelchair, bedside commode, etc,.)?: None Help needed moving to and from a bed to chair (including a wheelchair)?: A Little Help needed walking in hospital room?: A Little Help needed climbing 3-5 steps with a railing? : A Lot 6 Click Score: 20    End of Session    Activity Tolerance: Patient tolerated treatment well Patient left: in bed;with call bell/phone within reach;with SCD's reapplied Nurse Communication: Mobility status PT Visit Diagnosis: Muscle weakness (generalized) (M62.81);Difficulty in walking, not elsewhere classified (R26.2);Pain Pain - part of body:  (abdomen)     Time: 4827-0786 PT Time Calculation (min) (ACUTE ONLY): 24 min  Charges:  $Gait Training: 8-22 mins $Therapeutic Activity: 8-22 mins                    G Codes:       Wray Kearns, PT, DPT [phone]  2   Dinesha Twiggs A Aeriel Boulay 01/26/2017, 11:15 AM

## 2017-01-26 NOTE — Progress Notes (Signed)
14 Days Post-Op   Subjective/Chief Complaint: Much less nausea over the last 24 hours.  Tolerated clears, though not a great amount.  Had small stool.    Objective: Vital signs in last 24 hours: Temp:  [97.8 F (36.6 C)-98.9 F (37.2 C)] 98.9 F (37.2 C) (06/12 0813) Pulse Rate:  [49-112] 70 (06/12 0813) Resp:  [14-23] 18 (06/12 0813) BP: (121-155)/(48-93) 121/93 (06/12 0813) SpO2:  [98 %-100 %] 99 % (06/12 0800) FiO2 (%):  [21 %] 21 % (06/12 0400) Last BM Date: 01/25/17  Intake/Output from previous day: 06/11 0701 - 06/12 0700 In: 1897 [P.O.:300; I.V.:1597] Out: 2200 [Urine:2200] Intake/Output this shift: Total I/O In: 540 [I.V.:540] Out: -   General appearance: cooperative Resp: breathing comfortably Cardio: regular rate and rhythm GI: soft, incision CDI, drains out  Lab Results:   Recent Labs  01/24/17 0330 01/25/17 0354  WBC 12.3* 10.4  HGB 10.7* 10.7*  HCT 33.8* 33.8*  PLT 499* 508*   BMET  Recent Labs  01/25/17 0354 01/26/17 0510  NA 136 133*  K 4.3 4.5  CL 105 100*  CO2 25 26  GLUCOSE 181* 255*  BUN 16 20  CREATININE 1.19* 1.13*  CALCIUM 8.4* 8.3*   PT/INR No results for input(s): LABPROT, INR in the last 72 hours. ABG No results for input(s): PHART, HCO3 in the last 72 hours.  Invalid input(s): PCO2, PO2  Studies/Results: No results found.  Anti-infectives: Anti-infectives    Start     Dose/Rate Route Frequency Ordered Stop   01/12/17 1530  ceFAZolin (ANCEF) IVPB 2g/100 mL premix     2 g 200 mL/hr over 30 Minutes Intravenous Every 8 hours 01/12/17 1521 01/12/17 2312   01/12/17 0736  ceFAZolin (ANCEF) 2-4 GM/100ML-% IVPB    Comments:  [name]   : cabinet override      01/12/17 0736 01/12/17 0801   01/12/17 0622  ceFAZolin (ANCEF) IVPB 2g/100 mL premix     2 g 200 mL/hr over 30 Minutes Intravenous On call to O.R. 01/12/17 0622 01/12/17 1158      Assessment/Plan: s/p Procedure(s): LAPAROSCOPY DIAGNOSTIC (N/A) WHIPPLE  PROCEDURE (N/A) 01/13/27 (Timia Casselman) FEN - advance to full liquids. DM- increase lantus Severe protein calorie malnutrition - TNA Deconditioning - PT/OT HTN - off cardene at this point. On oral medications.   appreciate cardiology assistance Stepdown, to floor in AM if doing well.   Qtc OK.  Low dose reglan.  Recheck EKG today.   VTE - Lovenox  LOS: 14 days    Kwali Wrinkle 01/26/2017

## 2017-01-27 LAB — MAGNESIUM: Magnesium: 2 mg/dL (ref 1.7–2.4)

## 2017-01-27 LAB — BASIC METABOLIC PANEL
Anion gap: 7 (ref 5–15)
BUN: 27 mg/dL — ABNORMAL HIGH (ref 6–20)
CO2: 25 mmol/L (ref 22–32)
Calcium: 8.5 mg/dL — ABNORMAL LOW (ref 8.9–10.3)
Chloride: 102 mmol/L (ref 101–111)
Creatinine, Ser: 1.3 mg/dL — ABNORMAL HIGH (ref 0.44–1.00)
GFR calc Af Amer: 44 mL/min — ABNORMAL LOW (ref >60)
GFR calc non Af Amer: 38 mL/min — ABNORMAL LOW (ref >60)
Glucose, Bld: 154 mg/dL — ABNORMAL HIGH (ref 65–99)
Potassium: 4.4 mmol/L (ref 3.5–5.1)
Sodium: 134 mmol/L — ABNORMAL LOW (ref 135–145)

## 2017-01-27 LAB — GLUCOSE, CAPILLARY
Glucose-Capillary: 159 mg/dL — ABNORMAL HIGH (ref 65–99)
Glucose-Capillary: 173 mg/dL — ABNORMAL HIGH (ref 65–99)
Glucose-Capillary: 190 mg/dL — ABNORMAL HIGH (ref 65–99)
Glucose-Capillary: 191 mg/dL — ABNORMAL HIGH (ref 65–99)
Glucose-Capillary: 207 mg/dL — ABNORMAL HIGH (ref 65–99)
Glucose-Capillary: 217 mg/dL — ABNORMAL HIGH (ref 65–99)
Glucose-Capillary: 227 mg/dL — ABNORMAL HIGH (ref 65–99)

## 2017-01-27 LAB — PHOSPHORUS: Phosphorus: 4.5 mg/dL (ref 2.5–4.6)

## 2017-01-27 MED ORDER — M.V.I. ADULT IV INJ
INJECTION | INTRAVENOUS | Status: AC
  Administered 2017-01-27: 18:00:00 via INTRAVENOUS
  Filled 2017-01-27: qty 1920

## 2017-01-27 MED ORDER — FAT EMULSION 20 % IV EMUL
240.0000 mL | INTRAVENOUS | Status: AC
  Administered 2017-01-27: 240 mL via INTRAVENOUS
  Filled 2017-01-27: qty 250

## 2017-01-27 MED ORDER — METOCLOPRAMIDE HCL 5 MG/ML IJ SOLN
5.0000 mg | Freq: Three times a day (TID) | INTRAMUSCULAR | Status: DC
  Administered 2017-01-27 – 2017-01-29 (×6): 5 mg via INTRAVENOUS
  Filled 2017-01-27 (×6): qty 2

## 2017-01-27 MED ORDER — CLONIDINE HCL 0.2 MG/24HR TD PTWK
0.2000 mg | MEDICATED_PATCH | TRANSDERMAL | Status: DC
  Administered 2017-01-29 – 2017-02-05 (×2): 0.2 mg via TRANSDERMAL
  Filled 2017-01-27 (×2): qty 1

## 2017-01-27 MED ORDER — MAGNESIUM SULFATE 2 GM/50ML IV SOLN: 2.0000 g | Freq: Once | INTRAVENOUS | Status: DC

## 2017-01-27 MED ORDER — INSULIN GLARGINE 100 UNIT/ML ~~LOC~~ SOLN
40.0000 [IU] | Freq: Every day | SUBCUTANEOUS | Status: DC
  Administered 2017-01-27 – 2017-01-28 (×2): 40 [IU] via SUBCUTANEOUS
  Filled 2017-01-27 (×2): qty 0.4

## 2017-01-27 MED ORDER — LOPERAMIDE HCL 2 MG PO CAPS
2.0000 mg | ORAL_CAPSULE | ORAL | Status: DC | PRN
Start: 2017-01-27 — End: 2017-01-27

## 2017-01-27 NOTE — Progress Notes (Signed)
Nutrition Follow-up  DOCUMENTATION CODES:   Obesity unspecified, Severe malnutrition in context of chronic illness  INTERVENTION:   -TPN management per pharmacy -RD will continue to follow for diet advancement  NUTRITION DIAGNOSIS:   Malnutrition (Severe) related to chronic illness (renal cell and pancreatic cancer) as evidenced by energy intake < or equal to 75% for > or equal to 1 month, percent weight loss.  Ongoing  GOAL:   Patient will meet greater than or equal to 90% of their needs  Progressing  MONITOR:   Diet advancement, PO intake, Supplement acceptance, Weight trends  REASON FOR ASSESSMENT:   Consult New TPN/TNA  ASSESSMENT:   Pt with PMH of DM, renal cell cancer and pancreatic mass s/p ERCP and biopsy postive for cancer symptoms included bloating admitted for new dx of ampullary carcinoma now s/p whipple.   Pt talking on phone at time of visit. Observed pt sipping water without difficulty.   6/11- PICC placed  Per MD notes, plan to transfer to floor today.   Staff report intake haas been minimal secondary to nausea. Noted 5% meal completion per doc flowsheets. Pt advanced to full liquids on 01/26/17.  *Pt remains on TPN support. Per pharmacy note, plan to continue Clinimix E 5/15 at 83ml/hr- provides 96g of protein and 1363 kCals per day meeting 80% of protein and 62% of kCal needs (daily MVI with trace elements every other day). Plan to continue to hold lipids per ASPEN guidelines- plan to start lipids on 01/30/17.   Labs reviewed: CBGS: 187-227.   Diet Order:  .TPN (CLINIMIX-E) Adult Diet full liquid Room service appropriate? Yes; Fluid consistency: Thin TPN (CLINIMIX-E) Adult  Skin:   (closed abdominal incisions)  Last BM:  01/26/17  Height:   Ht Readings from Last 1 Encounters:  01/23/17 5\' 4"  (1.626 m)    Weight:   Wt Readings from Last 1 Encounters:  01/25/17 204 lb 12.9 oz (92.9 kg)    Ideal Body Weight:  54.5 kg  BMI:  Body mass  index is 35.16 kg/m.  Estimated Nutritional Needs:   Kcal:  2000-2200  Protein:  110-120 grams  Fluid:  >/= 2 L/day  EDUCATION NEEDS:   No education needs identified at this time  Terion Hedman A. Jimmye [name], RD, LDN, CDE Pager: [phone] After hours Pager: [phone]

## 2017-01-27 NOTE — Progress Notes (Signed)
15 Days Post-Op   Subjective/Chief Complaint: Had nausea again last night.  BP stable.    Objective: Vital signs in last 24 hours: Temp:  [97.9 F (36.6 C)-98.8 F (37.1 C)] 98.5 F (36.9 C) (06/13 0843) Pulse Rate:  [56-80] 66 (06/13 0843) Resp:  [14-26] 16 (06/13 0853) BP: (129-158)/(56-65) 134/61 (06/13 0843) SpO2:  [96 %-100 %] 100 % (06/13 0853) FiO2 (%):  [28 %] 28 % (06/13 0853) Last BM Date: 01/26/17  Intake/Output from previous day: 06/12 0701 - 06/13 0700 In: 1260 [I.V.:1260] Out: 327 [Urine:325; Emesis/NG output:1; Stool:1] Intake/Output this shift: No intake/output data recorded.  General appearance: cooperative Resp: breathing comfortably Cardio: regular rate and rhythm GI: soft, incision CDI, drains out, sl distended today.  Lab Results:   Recent Labs  01/25/17 0354  WBC 10.4  HGB 10.7*  HCT 33.8*  PLT 508*   BMET  Recent Labs  01/25/17 0354 01/26/17 0510  NA 136 133*  K 4.3 4.5  CL 105 100*  CO2 25 26  GLUCOSE 181* 255*  BUN 16 20  CREATININE 1.19* 1.13*  CALCIUM 8.4* 8.3*   PT/INR No results for input(s): LABPROT, INR in the last 72 hours. ABG No results for input(s): PHART, HCO3 in the last 72 hours.  Invalid input(s): PCO2, PO2  Studies/Results: No results found.  Anti-infectives: Anti-infectives    Start     Dose/Rate Route Frequency Ordered Stop   01/12/17 1530  ceFAZolin (ANCEF) IVPB 2g/100 mL premix     2 g 200 mL/hr over 30 Minutes Intravenous Every 8 hours 01/12/17 1521 01/12/17 2312   01/12/17 0736  ceFAZolin (ANCEF) 2-4 GM/100ML-% IVPB    Comments:  [name]   : cabinet override      01/12/17 0736 01/12/17 0801   01/12/17 0622  ceFAZolin (ANCEF) IVPB 2g/100 mL premix     2 g 200 mL/hr over 30 Minutes Intravenous On call to O.R. 01/12/17 0622 01/12/17 1158      Assessment/Plan: s/p Procedure(s): LAPAROSCOPY DIAGNOSTIC (N/A) WHIPPLE PROCEDURE (N/A) 01/13/27 ([name]) FEN - full liquids as tolerated DM-  increase lantus again. Severe protein calorie malnutrition - TNA Deconditioning - PT/OT HTN - off cardene at this point. On oral medications.   Transfer to floor.   Qtc OK.  Increase low dose reglan.  QTC was unchanged.   VTE  prophylaxis- Lovenox   LOS: 15 days    Kaylee Mullins 01/27/2017

## 2017-01-27 NOTE — Progress Notes (Signed)
PT Cancellation Note  Patient Details Name: Kaylee Mullins MRN: [id number] DOB: 09-Mar-1936   Cancelled Treatment:    Reason Eval/Treat Not Completed: Patient declined, no reason specified Pt declined due to feeling nauseous and hurting. Not feeling up to it. Will follow up as time allows.   [name] 01/27/2017, 11:16 AM Wray Kearns, PT, DPT [phone]

## 2017-01-27 NOTE — Plan of Care (Signed)
Problem: Physical Regulation: Goal: Ability to maintain clinical measurements within normal limits will improve Outcome: Progressing All vitals stable  Problem: Nutrition: Goal: Adequate nutrition will be maintained Outcome: Progressing Taking full liquids, degree of nausea, TPN continuing  Problem: Bowel/Gastric: Goal: Will not experience complications related to bowel motility Outcome: Progressing Has had stool  Problem: Pain Managment: Goal: General experience of comfort will improve Outcome: Progressing Continues on Fentanyl PCA

## 2017-01-27 NOTE — Progress Notes (Addendum)
[facility] CONSULT NOTE   Pharmacy Consult for TPN Indication: Ileus  Patient Measurements: Height: 5\' 4"  (162.6 cm) Weight: 204 lb 12.9 oz (92.9 kg) IBW/kg (Calculated) : 54.7 TPN AdjBW (KG): 64.2 Body mass index is 35.16 kg/m.  Assessment: 81 year old female with severe malnutrition related to renal cell and pancreatic cancer who presented with abdominal mass on 01/12/17 s/p Whipple procedure same day, now with ileus unable to keep down orals and significant N/V.   GI: 2 Abdominal drains-minimal output charted in last 24h. NGT removed 6/1. LBM 6/12 - miralax. Zofran+phenergan prn. Advanced to full liquids 6/12 - continuing 6/13, transferred to floor 6/13. Per discussion w/ pt, eating ~15% of meals currently due to nausea - no wean of TPN today Reglan IV - QTc ok. PPI PO Endo: CBGs 187-227 (increased on TPN) Insulin requirements in the past 24 hours: 44 units SSI + 24 units insulin added to TPN bag + Lantus 34 qHS. -Synthroid Lytes: K 4.4 (goal>/=4 w/ ileus) on scheduled KCl 20 BID (on Lasix 40 po daily; Spironolactone). Mag 2 (goal >/=2 w/ ileus), Phos up 4.5 Renal: SCr up 1.13 (baseline ~ 0.8). UOP 0.1 mL/kg/hr per documentation. NS+20K at 36ml/hr Pulm: RA Cards: BP improved, HR ok. Lasix + spironolactone, amlodipine, irbesartan + HCTZ, Lopressor Hepatobil: LFTs elevated on 6/2, now down to wnl. Tbili wnl. PreAlb 14.7. Trigs 117 Neuro: hx CVA 2016 w/ residual L-hand and foot weakness. Fentanyl PCA ID: WBC wnl, Afebrile, no abx  Best Practices: Lovenox 40 TPN Access: triple lumen PICC placed 6/11 TPN start date: 01/23/17  Nutritional Goals (per RD recommendation on 6/8): KCal: 2000-2200 Protein: 110-120g   Current Nutrition:  Full liquids - taking in ~15% per pt TPN - Clinimix E 5/15 at 83ml/hr  Plan:  -Continue Clinimix E 5/15 at 57ml/hr + add 20% lipid emulsion at 20 ml/hr (transferred out of ICU) - provides 96g of protein and 1843kCals per  day meeting 87% of protein and 92% of kCal needs. -Daily MVI in TPN, trace elements every other day- next due 6/13 -Continue IVMF (NS +20K) at [facility] - changes can be made by MD if warrented -Increase to 45 units regular insulin to TPN bag. Continue resistant SSI q4h and Lantus increased to 40 units qhs per Surgery. -F/u TPN labs in the AM -F/u toleration of diet and ability to wean TPN   [name], PharmD, BCPS Clinical Pharmacist Rx Phone # for today: [phone] After 3:30PM, please call Main Rx: [phone] 01/27/2017 8:41 AM

## 2017-01-28 ENCOUNTER — Inpatient Hospital Stay (HOSPITAL_COMMUNITY): Payer: Medicare Other

## 2017-01-28 LAB — COMPREHENSIVE METABOLIC PANEL
ALT: 17 U/L (ref 14–54)
AST: 17 U/L (ref 15–41)
Albumin: 3.1 g/dL — ABNORMAL LOW (ref 3.5–5.0)
Alkaline Phosphatase: 73 U/L (ref 38–126)
Anion gap: 8 (ref 5–15)
BUN: 31 mg/dL — ABNORMAL HIGH (ref 6–20)
CO2: 25 mmol/L (ref 22–32)
Calcium: 8.3 mg/dL — ABNORMAL LOW (ref 8.9–10.3)
Chloride: 102 mmol/L (ref 101–111)
Creatinine, Ser: 1.29 mg/dL — ABNORMAL HIGH (ref 0.44–1.00)
GFR calc Af Amer: 45 mL/min — ABNORMAL LOW (ref >60)
GFR calc non Af Amer: 39 mL/min — ABNORMAL LOW (ref >60)
Glucose, Bld: 146 mg/dL — ABNORMAL HIGH (ref 65–99)
Potassium: 4.4 mmol/L (ref 3.5–5.1)
Sodium: 135 mmol/L (ref 135–145)
Total Bilirubin: 0.4 mg/dL (ref 0.3–1.2)
Total Protein: 6.4 g/dL — ABNORMAL LOW (ref 6.5–8.1)

## 2017-01-28 LAB — GLUCOSE, CAPILLARY
Glucose-Capillary: 147 mg/dL — ABNORMAL HIGH (ref 65–99)
Glucose-Capillary: 165 mg/dL — ABNORMAL HIGH (ref 65–99)
Glucose-Capillary: 169 mg/dL — ABNORMAL HIGH (ref 65–99)
Glucose-Capillary: 152 mg/dL — ABNORMAL HIGH (ref 65–99)
Glucose-Capillary: 195 mg/dL — ABNORMAL HIGH (ref 65–99)

## 2017-01-28 LAB — MAGNESIUM: Magnesium: 2.2 mg/dL (ref 1.7–2.4)

## 2017-01-28 LAB — PHOSPHORUS: Phosphorus: 4.5 mg/dL (ref 2.5–4.6)

## 2017-01-28 MED ORDER — FAT EMULSION 20 % IV EMUL
240.0000 mL | INTRAVENOUS | Status: AC
  Administered 2017-01-28: 240 mL via INTRAVENOUS
  Filled 2017-01-28: qty 250

## 2017-01-28 MED ORDER — CLINIMIX E/DEXTROSE (5/15) 5 % IV SOLN
INTRAVENOUS | Status: AC
  Administered 2017-01-28: 19:00:00 via INTRAVENOUS
  Filled 2017-01-28: qty 1920

## 2017-01-28 NOTE — Progress Notes (Signed)
PHARMACY - ADULT TOTAL PARENTERAL NUTRITION CONSULT NOTE   Pharmacy Consult for TPN Indication: Ileus  Patient Measurements: Height: 5\' 4"  (162.6 cm) Weight: 204 lb 12.9 oz (92.9 kg) IBW/kg (Calculated) : 54.7 TPN AdjBW (KG): 64.2 Body mass index is 35.16 kg/m.  Assessment: 81 year old female with severe malnutrition related to renal cell and pancreatic cancer who presented with abdominal mass on 01/12/17 s/p Whipple procedure same day, now with ileus unable to keep down orals and significant N/V.   Transferred to floor 6/13  GI: 2 Abdominal drains-minimal output charted in last 24h. NGT removed 6/1. LBM 6/13 - miralax. Zofran+phenergan prn. Advanced to full liquids 6/12 - continuing 6/14. Per discussion w/ pt, eating ~5-15% of meals currently due to N/V (again 6/14, ~1078ml bilious thick secretions per RN) Reglan IV - QTc ok. PPI PO Endo: Hx DM. CBGs 146-207 (increased while on TPN) Insulin requirements in the past 24 hours: 29 units SSI + 45 units insulin added to TPN bag + Lantus 40 qHS per Surgery -Synthroid Lytes: K 4.4 (goal>/=4 w/ ileus) on scheduled KCl 20 BID (Refused 1 dose 6/13; also on Lasix 40 po daily; Spironolactone - refused both 6/13). Mag 2.2 (goal >/=2 w/ ileus), coCa~8.7, Phos stable 4.5 Renal: SCr stable 1.3 (baseline ~ 0.8), BUN up 31, CrCl~40. UOP 0.4 mL/kg/hr per documentation. NS+20K at 72ml/hr Pulm: RA Cards: BP soft/wnl, HR ok. Lasix + spironolactone, amlodipine, irbesartan + HCTZ, Lopressor Hepatobil: LFTs elevated on 6/2, now down to wnl. Tbili wnl. PreAlb 14.7. Trigs 117 Neuro: hx CVA 2016 w/ residual L-hand and foot weakness. Fentanyl PCA ID: No issues  Best Practices: Lovenox 40 TPN Access: triple lumen PICC placed 6/11 TPN start date: 01/23/17  Nutritional Goals (per RD recommendation on 6/8): KCal: 2000-2200 Protein: 110-120g   Current Nutrition:  Full liquids - taking in ~5% TPN - Clinimix E 5/15 at 85ml/hr  Plan:  -Continue Clinimix E 5/15  at 39ml/hr + 20% lipid emulsion at 20 ml/hr - provides 96g of protein and 1843kCals per day meeting 87% of protein and 92% of kCal needs. -Daily MVI in TPN, trace elements every other day- next due 6/15 -Continue IVMF (NS +20K) at [facility] - changes can be made by MD if needed -Increase to 60 units regular insulin to TPN bag. Continue resistant SSI q4h and Lantus 40 units qhs per Surgery. -F/u TPN labs in the AM -F/u toleration of diet and ability to wean TPN   [name], PharmD, BCPS Clinical Pharmacist Rx Phone # for today: [phone] After 3:30PM, please call Main Rx: [id number] 01/28/2017 8:42 AM

## 2017-01-28 NOTE — Care Management Note (Signed)
Case Management Note Original Note Created by: Marvetta Gibbons RN, BSN Unit 2W-Case Manager-- [place] coverage [phone]  Patient Details  Name: Kaylee Mullins MRN: [id number] Date of Birth: October 30, 1935  Subjective/Objective:    Pt admitted s/p whipple 5/29                Action/Plan: PTA pt lived at home- CM to follow- noted pt remains on Cardene drip on 6/5  Expected Discharge Date:                  Expected Discharge Plan:  Skilled Nursing Facility  In-House Referral:  Clinical Social Work  Discharge planning Services  CM Consult  Post Acute Care Choice:    Choice offered to:     DME Arranged:    DME Agency:     HH Arranged:    [place] Agency:     Status of Service:  In process, will continue to follow  If discussed at Long Length of Stay Meetings, dates discussed:  6/5, 6/7, 6/12  Discharge Disposition:   Additional Comments: 01/28/2017 Discussed in LOS 01/28/17 - pt remains appropriate for continued stay.  Attending plan is to attempt to discontinue TPN today - if not pt will discharge to SNF with TPN.  CSW consulted and actively following   01/25/17- 1530- Kristi Webster RN, CM- pt off Cardene drip now, taking some clears, remains on TPN, per PT/OT evals recommendations for SNF- CSW has been consulted. Pt to tx out of ICU.   Maryclare Labrador, RN 01/28/2017, 9:37 AM

## 2017-01-28 NOTE — Progress Notes (Signed)
16 Days Post-Op   Subjective/Chief Complaint: Threw up 1 L bile last night.  Feels much better now.  Passing some gas, but not a lot.  Still belching.    Objective: Vital signs in last 24 hours: Temp:  [98.2 F (36.8 C)-98.9 F (37.2 C)] 98.5 F (36.9 C) (06/14 0717) Pulse Rate:  [44-84] 65 (06/14 0717) Resp:  [13-23] 16 (06/14 0800) BP: (115-134)/(44-96) 115/59 (06/14 0717) SpO2:  [97 %-100 %] 100 % (06/14 0800) FiO2 (%):  [28 %] 28 % (06/13 1655) Last BM Date: 01/27/17  Intake/Output from previous day: 06/13 0701 - 06/14 0700 In: 1150 [P.O.:120; I.V.:1030] Out: 825 [Urine:825] Intake/Output this shift: No intake/output data recorded.  General appearance: cooperative Resp: breathing comfortably Cardio: regular rate and rhythm GI: soft, incision CDI, drains out, non distended today.  Lab Results:  No results for input(s): WBC, HGB, HCT, PLT in the last 72 hours. BMET  Recent Labs  01/27/17 1230 01/28/17 0743  NA 134* 135  K 4.4 4.4  CL 102 102  CO2 25 25  GLUCOSE 154* 146*  BUN 27* 31*  CREATININE 1.30* 1.29*  CALCIUM 8.5* 8.3*   PT/INR No results for input(s): LABPROT, INR in the last 72 hours. ABG No results for input(s): PHART, HCO3 in the last 72 hours.  Invalid input(s): PCO2, PO2  Studies/Results: No results found.  Anti-infectives: Anti-infectives    Start     Dose/Rate Route Frequency Ordered Stop   01/12/17 1530  ceFAZolin (ANCEF) IVPB 2g/100 mL premix     2 g 200 mL/hr over 30 Minutes Intravenous Every 8 hours 01/12/17 1521 01/12/17 2312   01/12/17 0736  ceFAZolin (ANCEF) 2-4 GM/100ML-% IVPB    Comments:  [name]   : cabinet override      01/12/17 0736 01/12/17 0801   01/12/17 0622  ceFAZolin (ANCEF) IVPB 2g/100 mL premix     2 g 200 mL/hr over 30 Minutes Intravenous On call to O.R. 01/12/17 0622 01/12/17 1158      Assessment/Plan: s/p Procedure(s): LAPAROSCOPY DIAGNOSTIC (N/A) WHIPPLE PROCEDURE (N/A) 01/13/27 (Jessalyn Hinojosa) FEN  -sips of clears. DM- blood sugars better. Severe protein calorie malnutrition - TNA Deconditioning - PT/OT HTN - off cardene at this point. On oral medications.   Transfer to floor pending.    Qtc OK. Reglan.  QTC was unchanged.   VTE  prophylaxis- Lovenox   LOS: 16 days    Kaylee Mullins 01/28/2017

## 2017-01-28 NOTE — Plan of Care (Signed)
Problem: Physical Regulation: Goal: Will remain free from infection Outcome: Progressing NO infection noted  Problem: Skin Integrity: Goal: Risk for impaired skin integrity will decrease Outcome: Progressing C/o some pain in left buttock area , not over bony prominenece. No redness noted. Massages 2 x this shift patient keeping off her left side.   Problem: Nutrition: Goal: Adequate nutrition will be maintained Outcome: Not Progressing Continues on TPN , unable to eat due to nausea  Problem: Bowel/Gastric: Goal: Will not experience complications related to bowel motility Outcome: Progressing Having BM's started on reglan

## 2017-01-28 NOTE — Progress Notes (Signed)
Physical Therapy Treatment Patient Details Name: Kaylee Mullins MRN: [id number] DOB: [birth year] Today's Date: 01/28/2017    History of Present Illness This 81 y.o female admitted with cystic distal pancreatic mass.  She underwent  whipple procedure.   Biopsy + for adinocarcinoma.  PMH includes:  CVA, spinal stenosis, PNA, DM, depression, CKD    PT Comments    Patient not feeling well today and really tired. Tolerated SPT from bed to/from [facility] with Min guard assist to manage lines and for safety. Pt reports vomiting lots of bile last night and hopes she will be more motivated to return to activity tomorrow. VSS throughout. Will continue to follow and progress to ambulation.    Follow Up Recommendations  SNF     Equipment Recommendations  3in1 (PT)    Recommendations for Other Services       Precautions / Restrictions Precautions Precautions: Fall Precaution Comments: monitor BP and vitals Restrictions Weight Bearing Restrictions: No    Mobility  Bed Mobility Overal bed mobility: Needs Assistance Bed Mobility: Rolling;Sidelying to Sit Rolling: Supervision Sidelying to sit: Supervision       General bed mobility comments: Use of rail, no assist needed.   Transfers Overall transfer level: Needs assistance Equipment used: None Transfers: Sit to/from [facility] Sit to Stand: Min guard Stand pivot transfers: Min guard       General transfer comment: Min guard assist for safety. Stood from [employer], from [facility] x1.  SPT from bed to/from [facility] with Min guard assist.  Ambulation/Gait             General Gait Details: Deferred secondary to pt not feeling well.   Stairs            Wheelchair Mobility    Modified Rankin (Stroke Patients Only)       Balance Overall balance assessment: Needs assistance Sitting-balance support: Feet supported;No upper extremity supported Sitting balance-Leahy Scale: Fair     Standing balance support: During  functional activity;Single extremity supported Standing balance-Leahy Scale: Fair Standing balance comment: Able to perform pericare without difficulty in standing.                            Cognition Arousal/Alertness: Awake/alert Behavior During Therapy: WFL for tasks assessed/performed Overall Cognitive Status: Within Functional Limits for tasks assessed                                        Exercises      General Comments General comments (skin integrity, edema, etc.): VSS.      Pertinent Vitals/Pain Pain Assessment: Faces Faces Pain Scale: Hurts a little bit Pain Location: abdomen  Pain Descriptors / Indicators: Operative site guarding;Discomfort Pain Intervention(s): Monitored during session;Repositioned    Home Living                      Prior Function            PT Goals (current goals can now be found in the care plan section) Progress towards PT goals: Progressing toward goals    Frequency    Min 3X/week      PT Plan Current plan remains appropriate    Co-evaluation              AM-PAC PT "6 Clicks" Daily Activity  Outcome Measure  Difficulty turning over in bed (including adjusting bedclothes, sheets and blankets)?: None Difficulty moving from lying on back to sitting on the side of the bed? : None Difficulty sitting down on and standing up from a chair with arms (e.g., wheelchair, bedside commode, etc,.)?: None Help needed moving to and from a bed to chair (including a wheelchair)?: A Little Help needed walking in hospital room?: A Little Help needed climbing 3-5 steps with a railing? : A Lot 6 Click Score: 20    End of Session Equipment Utilized During Treatment: Gait belt Activity Tolerance: Patient tolerated treatment well Patient left: in bed;with call bell/phone within reach Nurse Communication: Mobility status PT Visit Diagnosis: Muscle weakness (generalized) (M62.81);Difficulty in walking,  not elsewhere classified (R26.2);Pain Pain - part of body:  (abdomen)     Time: 8251-8984 PT Time Calculation (min) (ACUTE ONLY): 14 min  Charges:  $Therapeutic Activity: 8-22 mins                    G Codes:       Wray Kearns, PT, DPT [phone]     Marguarite Arbour A Yong Wahlquist 01/28/2017, 2:03 PM

## 2017-01-28 NOTE — Progress Notes (Signed)
C/o nausea, medicated with routine reglan. Vomited 1000 ml of bilious thick secretions. Cleaned up and repositioned. Patient states she feels better. Dr. Hulen Skains notified of emisis

## 2017-01-29 LAB — BASIC METABOLIC PANEL
Anion gap: 9 (ref 5–15)
BUN: 31 mg/dL — ABNORMAL HIGH (ref 6–20)
CO2: 26 mmol/L (ref 22–32)
Calcium: 8.4 mg/dL — ABNORMAL LOW (ref 8.9–10.3)
Chloride: 99 mmol/L — ABNORMAL LOW (ref 101–111)
Creatinine, Ser: 1.23 mg/dL — ABNORMAL HIGH (ref 0.44–1.00)
GFR calc Af Amer: 47 mL/min — ABNORMAL LOW (ref >60)
GFR calc non Af Amer: 41 mL/min — ABNORMAL LOW (ref >60)
Glucose, Bld: 126 mg/dL — ABNORMAL HIGH (ref 65–99)
Potassium: 4 mmol/L (ref 3.5–5.1)
Sodium: 134 mmol/L — ABNORMAL LOW (ref 135–145)

## 2017-01-29 LAB — GLUCOSE, CAPILLARY
Glucose-Capillary: 103 mg/dL — ABNORMAL HIGH (ref 65–99)
Glucose-Capillary: 109 mg/dL — ABNORMAL HIGH (ref 65–99)
Glucose-Capillary: 122 mg/dL — ABNORMAL HIGH (ref 65–99)
Glucose-Capillary: 133 mg/dL — ABNORMAL HIGH (ref 65–99)
Glucose-Capillary: 137 mg/dL — ABNORMAL HIGH (ref 65–99)
Glucose-Capillary: 138 mg/dL — ABNORMAL HIGH (ref 65–99)
Glucose-Capillary: 142 mg/dL — ABNORMAL HIGH (ref 65–99)

## 2017-01-29 LAB — MULTIPLE MYELOMA PANEL, SERUM
Albumin SerPl Elph-Mcnc: 2.9 g/dL (ref 2.9–4.4)
Albumin/Glob SerPl: 1.3 (ref 0.7–1.7)
Alpha 1: 0.3 g/dL (ref 0.0–0.4)
Alpha2 Glob SerPl Elph-Mcnc: 0.6 g/dL (ref 0.4–1.0)
B-Globulin SerPl Elph-Mcnc: 0.7 g/dL (ref 0.7–1.3)
Gamma Glob SerPl Elph-Mcnc: 0.8 g/dL (ref 0.4–1.8)
Globulin, Total: 2.3 g/dL (ref 2.2–3.9)
IgA: 15 mg/dL — ABNORMAL LOW (ref 64–422)
IgG (Immunoglobin G), Serum: 646 mg/dL — ABNORMAL LOW (ref 700–1600)
IgM, Serum: 11 mg/dL — ABNORMAL LOW (ref 26–217)
M Protein SerPl Elph-Mcnc: 0.4 g/dL — ABNORMAL HIGH
Total Protein ELP: 5.2 g/dL — ABNORMAL LOW (ref 6.0–8.5)

## 2017-01-29 LAB — MAGNESIUM: Magnesium: 2.2 mg/dL (ref 1.7–2.4)

## 2017-01-29 LAB — PHOSPHORUS: Phosphorus: 4.3 mg/dL (ref 2.5–4.6)

## 2017-01-29 MED ORDER — OXYCODONE HCL 5 MG/5ML PO SOLN
5.0000 mg | ORAL | Status: DC | PRN
  Administered 2017-01-29 – 2017-02-05 (×5): 10 mg via ORAL
  Filled 2017-01-29 (×5): qty 10

## 2017-01-29 MED ORDER — FAT EMULSION 20 % IV EMUL
240.0000 mL | INTRAVENOUS | Status: AC
  Administered 2017-01-29: 240 mL via INTRAVENOUS
  Filled 2017-01-29: qty 250

## 2017-01-29 MED ORDER — TRACE MINERALS CR-CU-MN-SE-ZN 10-1000-500-60 MCG/ML IV SOLN
INTRAVENOUS | Status: AC
  Administered 2017-01-29: 18:00:00 via INTRAVENOUS
  Filled 2017-01-29: qty 1992

## 2017-01-29 MED ORDER — METOCLOPRAMIDE HCL 5 MG/ML IJ SOLN
5.0000 mg | Freq: Four times a day (QID) | INTRAMUSCULAR | Status: AC
  Administered 2017-01-29 – 2017-01-30 (×3): 5 mg via INTRAVENOUS
  Filled 2017-01-29 (×3): qty 2

## 2017-01-29 MED ORDER — INSULIN GLARGINE 100 UNIT/ML ~~LOC~~ SOLN
35.0000 [IU] | Freq: Every day | SUBCUTANEOUS | Status: DC
  Administered 2017-01-29: 35 [IU] via SUBCUTANEOUS
  Filled 2017-01-29: qty 0.35

## 2017-01-29 NOTE — Progress Notes (Signed)
Transferred to 6N16 by bed, stable, report given to RN, belongings with pt.

## 2017-01-29 NOTE — Progress Notes (Addendum)
PHARMACY - ADULT TOTAL PARENTERAL NUTRITION CONSULT NOTE   Pharmacy Consult:  TPN Indication: Ileus  Patient Measurements: Height: 5\' 4"  (162.6 cm) Weight: 199 lb (90.3 kg) IBW/kg (Calculated) : 54.7 TPN AdjBW (KG): 64.2 Body mass index is 34.16 kg/m.  Assessment:  Kaylee Mullins with severe malnutrition related to renal cell and pancreatic cancer who presented with abdominal mass on 01/12/17.  She is s/p Whipple procedure on the same day.  Pharmacy consulted to manage TPN for ileus that prevented oral intake and caused significant nausea and vomiting.  GI: vomited 1L bile on 6/13 PM, no further vomiting.  Prealbumin improved to 14.7.  NGT removed 6/1, LBM 6/14.  Reglan, PRN Zofran/Phenergan (last QTc 467mc), PPI PO, Miralax.  Plan d/c to SNF on TPN. Endo: hypothyroid on Synthroid.  DM - CBGs improving (increased while on TPN) Insulin requirements in the past 24 hours: 18 units SSI + 60 units in TPN + Lantus 40 qHS Lytes: mild hyponatremia/hypochloremia, others WNL.  K+ at goal (KCl 20 BID, refused 1 dose 6/14). Renal: SCr 1.23 (BL SCr ~ 0.8), BUN 31. NS 20K at 10 ml/hr, UOP 0.3 ml/hg/hr Pulm: stable on RA Cards: HTN - VSS - Lasix 40 daily, spironolactone, Norvasc, irbesartan, HCTZ, Lopressor, clonidine patch incr to 0.2mg  Hepatobil: LFTs normalized.  Tbili / TG WNL. 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 Fentanyl PCA, pain score 0 ID: afebrile, WBC WNL - not on abx Best Practices: Lovenox 40 TPN Access: triple lumen PICC placed 01/25/17 TPN start date: 01/23/17 *refusing meds intermittently   Nutritional Goals (per RD recommendation on 6/13): 2000-2200 kCal and 110-120g protein per day  Current Nutrition:  TPN Full liquid diet - eating 5% of meals.  Eating causes abd pain and burping.   Plan:  - Increase Clinimix E 5/15 to 83 ml/hr and continue 20% ILE at 20 ml/hr.  TPN provides 1894 kCal and 100 gm of protein per day, meeting > 90% of total needs. - Daily  multivitamin in TPN - Trace elements in TPN every other day d/t shortage, next 6/15 - Continue resistant SSI Q4H + 60 units insulin in TPN + reduce Lantus to 35 units SQ QHS - F/U CBGs in AM.  If appropriate, attempt cyclic TPN.   Kavon Valenza D. Mina Marble, PharmD, BCPS Pager:  [phone] 01/29/2017, 10:36 AM

## 2017-01-29 NOTE — Progress Notes (Signed)
17 Days Post-Op   Subjective/Chief Complaint: Still feels OK now that not taking in much PO.  Was able to do PT yesterday.  Still passing little to no gas.  Objective: Vital signs in last 24 hours: Temp:  [97.7 F (36.5 C)-98.6 F (37 C)] 98.2 F (36.8 C) (06/15 0851) Pulse Rate:  [60-77] 62 (06/15 0851) Resp:  [11-21] 18 (06/15 0851) BP: (131-146)/(57-76) 146/59 (06/15 0851) SpO2:  [97 %-100 %] 99 % (06/15 0851) FiO2 (%):  [28 %] 28 % (06/14 1600) Weight:  [90.3 kg (199 lb)] 90.3 kg (199 lb) (06/15 0427) Last BM Date: 01/28/17  Intake/Output from previous day: 06/14 0701 - 06/15 0700 In: 2003.3 [P.O.:720; I.V.:1283.3] Out: 625 [Urine:625] Intake/Output this shift: Total I/O In: 80 [I.V.:80] Out: -   General appearance: cooperative Resp: breathing comfortably Cardio: regular rate and rhythm GI: soft, incision CDI,  mildly distended today.  Lab Results:  No results for input(s): WBC, HGB, HCT, PLT in the last 72 hours. BMET  Recent Labs  01/28/17 0743 01/29/17 0455  NA 135 134*  K 4.4 4.0  CL 102 99*  CO2 25 26  GLUCOSE 146* 126*  BUN 31* 31*  CREATININE 1.29* 1.23*  CALCIUM 8.3* 8.4*   PT/INR No results for input(s): LABPROT, INR in the last 72 hours. ABG No results for input(s): PHART, HCO3 in the last 72 hours.  Invalid input(s): PCO2, PO2  Studies/Results: Dg Abd 2 Views  Result Date: 01/29/2017 CLINICAL DATA:  Acute onset of generalized abdominal bloating. Initial encounter. EXAM: ABDOMEN - 2 VIEW COMPARISON:  CT of the abdomen and pelvis performed 10/23/2016, and chest radiograph performed 01/15/2017 FINDINGS: There is diffuse distention of the stomach. The visualized bowel gas pattern is otherwise grossly unremarkable. Postoperative change is seen along the anterior midline abdominal wall. A pancreatic duct stent is now seen. No free intra-abdominal air is seen on the upright view. A small right pleural effusion is noted. No acute osseous  abnormalities are identified. IMPRESSION: Diffuse distention of the stomach may reflect gastroparesis. No free intra-abdominal air seen. Small right pleural effusion noted. Electronically Signed   By: Garald Balding M.D.   On: 01/29/2017 02:48    Anti-infectives: Anti-infectives    Start     Dose/Rate Route Frequency Ordered Stop   01/12/17 1530  ceFAZolin (ANCEF) IVPB 2g/100 mL premix     2 g 200 mL/hr over 30 Minutes Intravenous Every 8 hours 01/12/17 1521 01/12/17 2312   01/12/17 0736  ceFAZolin (ANCEF) 2-4 GM/100ML-% IVPB    Comments:  [name]   : cabinet override      01/12/17 0736 01/12/17 0801   01/12/17 0622  ceFAZolin (ANCEF) IVPB 2g/100 mL premix     2 g 200 mL/hr over 30 Minutes Intravenous On call to O.R. 01/12/17 0622 01/12/17 1158      Assessment/Plan: s/p Procedure(s): LAPAROSCOPY DIAGNOSTIC (N/A) WHIPPLE PROCEDURE (N/A) 01/13/27 (Requan Hardge) FEN -clears as tolerated. DM- blood sugars better.  On lantus and regular insulin in TNA. Severe protein calorie malnutrition - TNA Deconditioning - PT/OT HTN -  On oral medications.  Stable Try transfer to floor again.     Qtc OK. Reglan.  QTC was unchanged.   VTE  prophylaxis- Lovenox  Work on SNF with TNA.   LOS: 17 days    Maylea Soria 01/29/2017

## 2017-01-29 NOTE — Progress Notes (Signed)
Occupational Therapy Treatment Patient Details Name: Kaylee Mullins MRN: [id number] DOB: [birth year] Today's Date: 01/29/2017    History of present illness This 81 y.o female admitted with cystic distal pancreatic mass.  She underwent  whipple procedure.   Biopsy + for adinocarcinoma.  PMH includes:  CVA, spinal stenosis, PNA, DM, depression, CKD   OT comments  Pt with improved activity tolerance today and agreeable to perform functional mobility to bathroom for toilet transfer with min guard assist today and use of RW for balance. Pt able to perform grooming activities standing at the sink with min guard assist. Pt required set up for UB dressing and mod assist for LB dressing. D/c plan remains appropriate. Will continue to follow acutely.   Follow Up Recommendations  SNF;Supervision/Assistance - 24 hour    Equipment Recommendations  3 in 1 bedside commode    Recommendations for Other Services      Precautions / Restrictions Precautions Precautions: Fall Restrictions Weight Bearing Restrictions: No       Mobility Bed Mobility Overal bed mobility: Needs Assistance Bed Mobility: Supine to Sit Rolling: Supervision         General bed mobility comments: for safety. Increased time, no physical assist  Transfers Overall transfer level: Needs assistance Equipment used: Rolling walker (2 wheeled) Transfers: Sit to/from Stand Sit to Stand: Min guard         General transfer comment: for safety, no physical assist. Good hand placement and technique with RW    Balance Overall balance assessment: Needs assistance Sitting-balance support: Feet supported;No upper extremity supported Sitting balance-Leahy Scale: Good     Standing balance support: No upper extremity supported;During functional activity Standing balance-Leahy Scale: Fair                             ADL either performed or assessed with clinical judgement   ADL Overall ADL's : Needs  assistance/impaired     Grooming: Min guard;Standing;Wash/dry hands           Upper Body Dressing : Set up;Sitting   Lower Body Dressing: Moderate assistance;Sit to/from stand Lower Body Dressing Details (indicate cue type and reason): to doff socks and don slippers Toilet Transfer: Min guard;Ambulation;Comfort height toilet;RW   Toileting- Water quality scientist and Hygiene: Min guard;Sit to/from stand       Functional mobility during ADLs: Passenger transport manager     Praxis      Cognition Arousal/Alertness: Awake/alert Behavior During Therapy: WFL for tasks assessed/performed Overall Cognitive Status: Within Functional Limits for tasks assessed                                          Exercises     Shoulder Instructions       General Comments      Pertinent Vitals/ Pain       Pain Assessment: Faces Faces Pain Scale: Hurts little more Pain Location: abdomen Pain Descriptors / Indicators: Sore Pain Intervention(s): Monitored during session;Repositioned;Premedicated before session  Home Living                                          Prior Functioning/Environment  Frequency  Min 2X/week        Progress Toward Goals  OT Goals(current goals can now be found in the care plan section)  Progress towards OT goals: Progressing toward goals  Acute Rehab OT Goals Patient Stated Goal: to get better  OT Goal Formulation: With patient  Plan Discharge plan remains appropriate    Co-evaluation                 AM-PAC PT "6 Clicks" Daily Activity     Outcome Measure   Help from another person eating meals?: None Help from another person taking care of personal grooming?: A Little Help from another person toileting, which includes using toliet, bedpan, or urinal?: A Little Help from another person bathing (including washing, rinsing, drying)?: A Lot Help from  another person to put on and taking off regular upper body clothing?: A Little Help from another person to put on and taking off regular lower body clothing?: A Lot 6 Click Score: 17    End of Session Equipment Utilized During Treatment: Rolling walker  OT Visit Diagnosis: Unsteadiness on feet (R26.81);Pain Pain - part of body:  (abdomen)   Activity Tolerance Patient tolerated treatment well   Patient Left Other (comment) (in hallway with PT)   Nurse Communication          Time: 4081-4481 OT Time Calculation (min): 12 min  Charges: OT General Charges $OT Visit: 1 Procedure OT Treatments $Self Care/Home Management : 8-22 mins  Natalya Domzalski A. Ulice Brilliant, M.S., OTR/L Pager: [phone] 01/29/2017, 2:50 PM

## 2017-01-29 NOTE — Progress Notes (Signed)
Physical Therapy Treatment Patient Details Name: Kaylee Mullins MRN: [id number] DOB: 10/16/1935 Today's Date: 01/29/2017    History of Present Illness This 81 y.o female admitted with cystic distal pancreatic mass.  She underwent  whipple procedure.   Biopsy + for adinocarcinoma.  PMH includes:  CVA, spinal stenosis, PNA, DM, depression, CKD    PT Comments    Patient seen for mobility progression. Tolerated ambulation and therex but remains limited by pain and fatigue. Continue to recommend ST SNF due to limited caregiver assist and functional limitations in activity as indicated. Will continue to see and progress as tolerated.    Follow Up Recommendations  SNF     Equipment Recommendations  3in1 (PT)    Recommendations for Other Services       Precautions / Restrictions Precautions Precautions: Fall Restrictions Weight Bearing Restrictions: No    Mobility  Bed Mobility Overal bed mobility: Needs Assistance Bed Mobility: Sit to Supine     Supine to sit: Min guard     General bed mobility comments: min guard for safety, increased time and effort, HOB elevated   Transfers Overall transfer level: Needs assistance Equipment used: Rolling walker (2 wheeled) Transfers: Sit to/from Stand Sit to Stand: Min guard         General transfer comment: for safety, no physical assist. Good hand placement and technique with RW  Ambulation/Gait Ambulation/Gait assistance: Min guard Ambulation Distance (Feet): 120 Feet (x2 with standing rest break due to fatigue) Assistive device: Rolling walker (2 wheeled) Gait Pattern/deviations: Step-through pattern;Trunk flexed;Decreased stride length Gait velocity: decreased Gait velocity interpretation: Below normal speed for age/gender General Gait Details: increased time and effort during ambulation. Ambulated with use of RW, limited by pain and fatigue requiring standing rest break   Stairs            Wheelchair Mobility     Modified Rankin (Stroke Patients Only)       Balance Overall balance assessment: Needs assistance Sitting-balance support: Feet supported;No upper extremity supported Sitting balance-Leahy Scale: Good     Standing balance support: No upper extremity supported;During functional activity Standing balance-Leahy Scale: Fair                              Cognition Arousal/Alertness: Awake/alert Behavior During Therapy: WFL for tasks assessed/performed Overall Cognitive Status: Within Functional Limits for tasks assessed                                        Exercises      General Comments General comments (skin integrity, edema, etc.): VSS      Pertinent Vitals/Pain Pain Assessment: Faces Faces Pain Scale: Hurts little more Pain Location: abdomen Pain Descriptors / Indicators: Sore Pain Intervention(s): Monitored during session    Home Living                      Prior Function            PT Goals (current goals can now be found in the care plan section) Acute Rehab PT Goals Patient Stated Goal: to get better  PT Goal Formulation: With patient Time For Goal Achievement: 02/04/17 Potential to Achieve Goals: Good Progress towards PT goals: Progressing toward goals    Frequency    Min 2X/week      PT Plan Current  plan remains appropriate    Co-evaluation              AM-PAC PT "6 Clicks" Daily Activity  Outcome Measure  Difficulty turning over in bed (including adjusting bedclothes, sheets and blankets)?: None Difficulty moving from lying on back to sitting on the side of the bed? : None Difficulty sitting down on and standing up from a chair with arms (e.g., wheelchair, bedside commode, etc,.)?: None Help needed moving to and from a bed to chair (including a wheelchair)?: A Little Help needed walking in hospital room?: A Little Help needed climbing 3-5 steps with a railing? : A Lot 6 Click Score: 20     End of Session Equipment Utilized During Treatment: Gait belt Activity Tolerance: Patient tolerated treatment well Patient left: in bed;with call bell/phone within reach Nurse Communication: Mobility status PT Visit Diagnosis: Muscle weakness (generalized) (M62.81);Difficulty in walking, not elsewhere classified (R26.2);Pain Pain - part of body:  (abdomen)     Time: 4461-9012 PT Time Calculation (min) (ACUTE ONLY): 18 min  Charges:  $Gait Training: 8-22 mins                    G Codes:       Alben Deeds, PT DPT  [place] 01/29/2017, 3:08 PM

## 2017-01-30 LAB — BASIC METABOLIC PANEL
Anion gap: 8 (ref 5–15)
BUN: 35 mg/dL — ABNORMAL HIGH (ref 6–20)
CO2: 25 mmol/L (ref 22–32)
Calcium: 8.4 mg/dL — ABNORMAL LOW (ref 8.9–10.3)
Chloride: 101 mmol/L (ref 101–111)
Creatinine, Ser: 1.26 mg/dL — ABNORMAL HIGH (ref 0.44–1.00)
GFR calc Af Amer: 46 mL/min — ABNORMAL LOW (ref >60)
GFR calc non Af Amer: 40 mL/min — ABNORMAL LOW (ref >60)
Glucose, Bld: 110 mg/dL — ABNORMAL HIGH (ref 65–99)
Potassium: 4.3 mmol/L (ref 3.5–5.1)
Sodium: 134 mmol/L — ABNORMAL LOW (ref 135–145)

## 2017-01-30 LAB — GLUCOSE, CAPILLARY
Glucose-Capillary: 101 mg/dL — ABNORMAL HIGH (ref 65–99)
Glucose-Capillary: 102 mg/dL — ABNORMAL HIGH (ref 65–99)
Glucose-Capillary: 115 mg/dL — ABNORMAL HIGH (ref 65–99)
Glucose-Capillary: 123 mg/dL — ABNORMAL HIGH (ref 65–99)
Glucose-Capillary: 127 mg/dL — ABNORMAL HIGH (ref 65–99)
Glucose-Capillary: 79 mg/dL (ref 65–99)

## 2017-01-30 LAB — CBC
HCT: 33 % — ABNORMAL LOW (ref 36.0–46.0)
Hemoglobin: 10.6 g/dL — ABNORMAL LOW (ref 12.0–15.0)
MCH: 30.9 pg (ref 26.0–34.0)
MCHC: 32.1 g/dL (ref 30.0–36.0)
MCV: 96.2 fL (ref 78.0–100.0)
Platelets: 439 10*3/uL — ABNORMAL HIGH (ref 150–400)
RBC: 3.43 MIL/uL — ABNORMAL LOW (ref 3.87–5.11)
RDW: 13.9 % (ref 11.5–15.5)
WBC: 8.8 10*3/uL (ref 4.0–10.5)

## 2017-01-30 MED ORDER — FAT EMULSION 20 % IV EMUL
240.0000 mL | INTRAVENOUS | Status: AC
  Administered 2017-01-30: 240 mL via INTRAVENOUS
  Filled 2017-01-30: qty 250

## 2017-01-30 MED ORDER — M.V.I. ADULT IV INJ
INTRAVENOUS | Status: AC
  Administered 2017-01-30: 18:00:00 via INTRAVENOUS
  Filled 2017-01-30: qty 1992

## 2017-01-30 MED ORDER — ERYTHROMYCIN ETHYLSUCCINATE 200 MG/5ML PO SUSR
200.0000 mg | Freq: Four times a day (QID) | ORAL | Status: DC
  Administered 2017-01-31 – 2017-02-02 (×8): 200 mg via ORAL
  Filled 2017-01-30 (×15): qty 5

## 2017-01-30 MED ORDER — INSULIN GLARGINE 100 UNIT/ML ~~LOC~~ SOLN
27.0000 [IU] | Freq: Every day | SUBCUTANEOUS | Status: DC
  Administered 2017-01-30: 27 [IU] via SUBCUTANEOUS
  Filled 2017-01-30: qty 0.27

## 2017-01-30 MED ORDER — SODIUM CHLORIDE 0.9 % IV SOLN
250.0000 mg | Freq: Four times a day (QID) | INTRAVENOUS | Status: AC
  Administered 2017-01-30: 250 mg via INTRAVENOUS
  Filled 2017-01-30 (×3): qty 5

## 2017-01-30 NOTE — Progress Notes (Signed)
18 Days Post-Op   Subjective/Chief Complaint: Threw up twice last night.  Has ambulated already today.    Objective: Vital signs in last 24 hours: Temp:  [97.8 F (36.6 C)-98.8 F (37.1 C)] 98.8 F (37.1 C) (06/16 0454) Pulse Rate:  [59-78] 78 (06/16 0454) Resp:  [20-22] 20 (06/16 0454) BP: (130-145)/(51-64) 130/63 (06/16 0454) SpO2:  [98 %-100 %] 100 % (06/16 0454) Last BM Date: 01/29/17  Intake/Output from previous day: 06/15 0701 - 06/16 0700 In: 2356.1 [P.O.:492; I.V.:1864.1] Out: 335 [Urine:801] Intake/Output this shift: No intake/output data recorded.  General appearance: cooperative Resp: breathing comfortably Cardio: regular rate and rhythm GI: soft, incision CDI,  nondistended today.  Lab Results:   Recent Labs  01/30/17 0338  WBC 8.8  HGB 10.6*  HCT 33.0*  PLT 439*   BMET  Recent Labs  01/29/17 0455 01/30/17 0338  NA 134* 134*  K 4.0 4.3  CL 99* 101  CO2 26 25  GLUCOSE 126* 110*  BUN 31* 35*  CREATININE 1.23* 1.26*  CALCIUM 8.4* 8.4*   PT/INR No results for input(s): LABPROT, INR in the last 72 hours. ABG No results for input(s): PHART, HCO3 in the last 72 hours.  Invalid input(s): PCO2, PO2  Studies/Results: Dg Abd 2 Views  Result Date: 01/29/2017 CLINICAL DATA:  Acute onset of generalized abdominal bloating. Initial encounter. EXAM: ABDOMEN - 2 VIEW COMPARISON:  CT of the abdomen and pelvis performed 10/23/2016, and chest radiograph performed 01/15/2017 FINDINGS: There is diffuse distention of the stomach. The visualized bowel gas pattern is otherwise grossly unremarkable. Postoperative change is seen along the anterior midline abdominal wall. A pancreatic duct stent is now seen. No free intra-abdominal air is seen on the upright view. A small right pleural effusion is noted. No acute osseous abnormalities are identified. IMPRESSION: Diffuse distention of the stomach may reflect gastroparesis. No free intra-abdominal air seen. Small right  pleural effusion noted. Electronically Signed   By: Garald Balding M.D.   On: 01/29/2017 02:48    Anti-infectives: Anti-infectives    Start     Dose/Rate Route Frequency Ordered Stop   01/30/17 1200  erythromycin 250 mg in sodium chloride 0.9 % 100 mL IVPB     250 mg 100 mL/hr over 60 Minutes Intravenous Every 6 hours 01/30/17 1109     01/12/17 1530  ceFAZolin (ANCEF) IVPB 2g/100 mL premix     2 g 200 mL/hr over 30 Minutes Intravenous Every 8 hours 01/12/17 1521 01/12/17 2312   01/12/17 0736  ceFAZolin (ANCEF) 2-4 GM/100ML-% IVPB    Comments:  [name]   : cabinet override      01/12/17 0736 01/12/17 0801   01/12/17 0622  ceFAZolin (ANCEF) IVPB 2g/100 mL premix     2 g 200 mL/hr over 30 Minutes Intravenous On call to O.R. 01/12/17 0622 01/12/17 1158      Assessment/Plan: s/p Procedure(s): LAPAROSCOPY DIAGNOSTIC (N/A) WHIPPLE PROCEDURE (N/A) 01/13/27 (Amie Cowens) FEN -clears as tolerated. DM- blood sugars better.  On lantus and regular insulin in TNA. Severe protein calorie malnutrition - TNA Deconditioning - PT/OT HTN -  On oral medications.  Stable  Gastroparesis/Delayed gastric emptying- reglan not working well, switch to erythromycin.  VTE  prophylaxis- Lovenox  Work on SNF with TNA.   LOS: 18 days    Desarie Feild 01/30/2017

## 2017-01-30 NOTE — Progress Notes (Signed)
PHARMACY - ADULT TOTAL PARENTERAL NUTRITION CONSULT NOTE   Pharmacy Consult:  TPN Indication: Ileus  Patient Measurements: Height: 5\' 4"  (162.6 cm) Weight: 199 lb (90.3 kg) IBW/kg (Calculated) : 54.7 TPN AdjBW (KG): 64.2 Body mass index is 34.16 kg/m.  Assessment:  46 YOF with severe malnutrition related to renal cell and pancreatic cancer who presented with abdominal mass on 01/12/17.  She is s/p Whipple procedure on the same day.  Pharmacy consulted to manage TPN for ileus that prevented oral intake and caused significant nausea and vomiting.  GI: vomited 1L bile on 6/13 PM, 2 episodes 6/15.  Prealbumin improved to 14.7.  NGT removed 6/1, LBM 6/14.  Reglan, PRN Zofran/Phenergan (last QTc 433mc), PPI PO, Miralax.  Plan d/c to SNF on TPN. Endo: hypothyroid on Synthroid.  DM not on med PTA - CBGs tightly controlled (increased while on TPN) Insulin requirements in the past 24 hours: 12 units SSI + 60 units in TPN + Lantus 35/d Lytes: mild hyponatremia, others WNL.  K+ at goal (KCl 20 BID, refused 1 dose 6/14). Renal: SCr 1.26 (BL SCr ~ 0.8), BUN 35. NS 20K at 10 ml/hr, UOP 0.4 ml/hg/hr Pulm: stable on RA Cards: HTN - VSS - Lasix 40 daily, spironolactone, Norvasc, irbesartan, HCTZ, Lopressor, clonidine patch incr to 0.2mg  Hepatobil: LFTs normalized.  Tbili / TG WNL. Neuro: hx CVA 2016 with residual L-hand and foot weakness ID: afebrile, WBC WNL - not on abx Best Practices: Lovenox 40 TPN Access: triple lumen PICC placed 01/25/17  TPN start date: 01/23/17  Nutritional Goals (per RD recommendation on 6/13): 2000-2200 kCal and 110-120g protein per day  Current Nutrition:  TPN Full liquid diet - eating 5% of meals.  Vomited x 2 last night per patient.   Plan:  - Continue Clinimix E 5/15 at 83 ml/hr and 20% ILE at 20 ml/hr.  TPN provides 1894 kCal and 100 gm of protein per day, meeting > 90% of total needs. - Daily multivitamin in TPN - Trace elements in TPN every other day d/t shortage,  next 6/17 - Continue resistant SSI Q4H + 60 units insulin in TPN + reduce Lantus to 27 units SQ QHS - Cycle TPN if able to get patient off of Lantus or reduced to the lowest dose possible   Keelon Zurn D. Mina Marble, PharmD, BCPS Pager:  [phone] 01/30/2017, 7:57 AM

## 2017-01-31 LAB — GLUCOSE, CAPILLARY
Glucose-Capillary: 104 mg/dL — ABNORMAL HIGH (ref 65–99)
Glucose-Capillary: 106 mg/dL — ABNORMAL HIGH (ref 65–99)
Glucose-Capillary: 104 mg/dL — ABNORMAL HIGH (ref 65–99)
Glucose-Capillary: 97 mg/dL (ref 65–99)
Glucose-Capillary: 108 mg/dL — ABNORMAL HIGH (ref 65–99)

## 2017-01-31 LAB — CBC
HCT: 33.8 % — ABNORMAL LOW (ref 36.0–46.0)
Hemoglobin: 10.6 g/dL — ABNORMAL LOW (ref 12.0–15.0)
MCH: 30.6 pg (ref 26.0–34.0)
MCHC: 31.4 g/dL (ref 30.0–36.0)
MCV: 97.7 fL (ref 78.0–100.0)
Platelets: 429 10*3/uL — ABNORMAL HIGH (ref 150–400)
RBC: 3.46 MIL/uL — ABNORMAL LOW (ref 3.87–5.11)
RDW: 14.2 % (ref 11.5–15.5)
WBC: 7.7 10*3/uL (ref 4.0–10.5)

## 2017-01-31 LAB — BASIC METABOLIC PANEL
Anion gap: 7 (ref 5–15)
BUN: 43 mg/dL — ABNORMAL HIGH (ref 6–20)
CO2: 26 mmol/L (ref 22–32)
Calcium: 8.4 mg/dL — ABNORMAL LOW (ref 8.9–10.3)
Chloride: 102 mmol/L (ref 101–111)
Creatinine, Ser: 1.45 mg/dL — ABNORMAL HIGH (ref 0.44–1.00)
GFR calc Af Amer: 39 mL/min — ABNORMAL LOW (ref >60)
GFR calc non Af Amer: 34 mL/min — ABNORMAL LOW (ref >60)
Glucose, Bld: 97 mg/dL (ref 65–99)
Potassium: 4.1 mmol/L (ref 3.5–5.1)
Sodium: 135 mmol/L (ref 135–145)

## 2017-01-31 MED ORDER — ALBUMIN HUMAN 25 % IV SOLN
25.0000 g | Freq: Four times a day (QID) | INTRAVENOUS | Status: AC
  Administered 2017-01-31 – 2017-02-02 (×8): 25 g via INTRAVENOUS
  Filled 2017-01-31 (×8): qty 100

## 2017-01-31 MED ORDER — INSULIN ASPART 100 UNIT/ML ~~LOC~~ SOLN: 0.0000 [IU] | SUBCUTANEOUS | Status: DC

## 2017-01-31 MED ORDER — INSULIN GLARGINE 100 UNIT/ML ~~LOC~~ SOLN
15.0000 [IU] | Freq: Every day | SUBCUTANEOUS | Status: DC
  Administered 2017-01-31: 15 [IU] via SUBCUTANEOUS
  Filled 2017-01-31: qty 0.15

## 2017-01-31 MED ORDER — TRACE MINERALS CR-CU-MN-SE-ZN 10-1000-500-60 MCG/ML IV SOLN
INTRAVENOUS | Status: AC
  Administered 2017-01-31: 18:00:00 via INTRAVENOUS
  Filled 2017-01-31: qty 1992

## 2017-01-31 MED ORDER — FAT EMULSION 20 % IV EMUL
240.0000 mL | INTRAVENOUS | Status: AC
  Administered 2017-01-31: 240 mL via INTRAVENOUS
  Filled 2017-01-31: qty 250

## 2017-01-31 NOTE — Progress Notes (Signed)
PHARMACY - ADULT TOTAL PARENTERAL NUTRITION CONSULT NOTE   Pharmacy Consult:  TPN Indication: Ileus  Patient Measurements: Height: 5\' 4"  (162.6 cm) Weight: 199 lb (90.3 kg) IBW/kg (Calculated) : 54.7 TPN AdjBW (KG): 64.2 Body mass index is 34.16 kg/m.  Assessment:  74 YOF with severe malnutrition related to renal cell and pancreatic cancer who presented with abdominal mass on 01/12/17.  She is s/p Whipple procedure on the same day.  Pharmacy consulted to manage TPN for ileus that prevented oral intake and caused significant nausea and vomiting.  GI: prealbumin improved to 14.7.  Persistent vomiting, O/P 679mL.  LBM 6/15.  Reglan >> erythromycin, PRN Zofran/Phenergan (last QTc 440mc), PPI PO, Miralax.  Plan d/c to SNF on TPN. Endo: hypothyroid on Synthroid.  DM not on med PTA - CBGs tightly controlled (increased while on TPN) Insulin requirements in the past 24 hours: 9 units SSI + 60 units in TPN + Lantus 27/d Lytes: all WNL.  K+ at goal (KCl 20 BID, refused both doses on 6/16). Renal: SCr up 1.45 (BL SCr ~ 0.8), BUN up to 43 - NS 20K at 10 ml/hr, UOP not charted Pulm: stable on RA Cards: HTN - VSS - Lasix 40 daily, spironolactone, Norvasc, irbesartan, HCTZ, Lopressor, clonidine patch incr to 0.2mg  Hepatobil: LFTs normalized.  Tbili / TG WNL. Neuro: hx CVA 2016 with residual L-hand and foot weakness ID: afebrile, WBC WNL - not on abx Best Practices: Lovenox 40 TPN Access: triple lumen PICC placed 01/25/17  TPN start date: 01/23/17  Nutritional Goals (per RD recommendation on 6/13): 2000-2200 kCal and 110-120g protein per day  Current Nutrition:  TPN Bariatric clear liquid diet - 50-80% of meals per charting.    Plan:   - Continue Clinimix E 5/15 at 83 ml/hr and 20% ILE at 20 ml/hr.  TPN provides 1894 kCal and 100 gm of protein per day, meeting > 90% of total needs. - Daily multivitamin in TPN - Trace elements in TPN every other day d/t shortage, next 6/17 - Reduce SSI to  moderate Q4H and Lantus to 15 units SQ QHS + continue 60 units insulin in TPN - F/U AM labs - Cycle TPN if able to get patient off of Lantus or reduced to the lowest dose possible - Consider repeating EKG given multiple meds that could prolong QTc   Vaniya Augspurger D. Mina Marble, PharmD, BCPS Pager:  [phone] 01/31/2017, 8:35 AM

## 2017-01-31 NOTE — Progress Notes (Addendum)
19 Days Post-Op   Subjective/Chief Complaint: No additional emesis other than what she told me about yesterday.  Having a bit of flatus now and had a more normal BM.  Still feels a bit full.    Objective: Vital signs in last 24 hours: Temp:  [98.3 F (36.8 C)-98.7 F (37.1 C)] 98.5 F (36.9 C) (06/17 0422) Pulse Rate:  [66-72] 67 (06/17 0422) Resp:  [18-19] 18 (06/17 0422) BP: (130-135)/(51-77) 134/77 (06/17 0422) SpO2:  [99 %-100 %] 99 % (06/17 0422) Last BM Date: 01/29/17  Intake/Output from previous day: 06/16 0701 - 06/17 0700 In: 3952.1 [P.O.:1560; I.V.:2292.1; IV Piggyback:100] Out: 600 [Emesis/NG output:600] Intake/Output this shift: No intake/output data recorded.  General appearance: cooperative Resp: breathing comfortably Cardio: regular rate and rhythm GI: soft, incision CDI,  nondistended today. Extremities:  BLE edema.  R>>L  Lab Results:   Recent Labs  01/30/17 0338 01/31/17 0522  WBC 8.8 7.7  HGB 10.6* 10.6*  HCT 33.0* 33.8*  PLT 439* 429*   BMET  Recent Labs  01/30/17 0338 01/31/17 0522  NA 134* 135  K 4.3 4.1  CL 101 102  CO2 25 26  GLUCOSE 110* 97  BUN 35* 43*  CREATININE 1.26* 1.45*  CALCIUM 8.4* 8.4*   PT/INR No results for input(s): LABPROT, INR in the last 72 hours. ABG No results for input(s): PHART, HCO3 in the last 72 hours.  Invalid input(s): PCO2, PO2  Studies/Results: No results found.  Anti-infectives: Anti-infectives    Start     Dose/Rate Route Frequency Ordered Stop   01/31/17 0000  erythromycin ethylsuccinate (EES) 200 MG/5ML suspension 200 mg     200 mg Oral Every 6 hours 01/30/17 1638     01/30/17 1200  erythromycin 250 mg in sodium chloride 0.9 % 100 mL IVPB     250 mg 100 mL/hr over 60 Minutes Intravenous Every 6 hours 01/30/17 1109 01/30/17 2359   01/12/17 1530  ceFAZolin (ANCEF) IVPB 2g/100 mL premix     2 g 200 mL/hr over 30 Minutes Intravenous Every 8 hours 01/12/17 1521 01/12/17 2312   01/12/17 0736   ceFAZolin (ANCEF) 2-4 GM/100ML-% IVPB    Comments:  [name]   : cabinet override      01/12/17 0736 01/12/17 0801   01/12/17 0622  ceFAZolin (ANCEF) IVPB 2g/100 mL premix     2 g 200 mL/hr over 30 Minutes Intravenous On call to O.R. 01/12/17 0622 01/12/17 1158      Assessment/Plan: s/p Procedure(s): LAPAROSCOPY DIAGNOSTIC (N/A) WHIPPLE PROCEDURE (N/A) 01/13/27 (Tavarion Babington) FEN -clears as tolerated. DM- blood sugars much better around 100.  On lantus and regular insulin in TNA. Severe protein calorie malnutrition - TNA Deconditioning - PT/OT HTN -  On oral medications.  Stable Elevated Creatinine - Add some additional fluid.  Recheck in AM.   Gastroparesis/Delayed gastric emptying- hoping erythromycin working better.  Stay on small amount clears until tomorrow at least.   VTE  prophylaxis- Lovenox RLE edema - check doppler.  Pt high risk for DVT.  Work on SNF with TNA.   LOS: 19 days    Leigh Blas 01/31/2017

## 2017-01-31 NOTE — Progress Notes (Signed)
Pt feels a little better this afternoon after resting today.  IV zofran given earlier in the day to prevent nausea and now pt is taking some chicken noodle soup without the noodles with good toleration.  She reports she had a small BM today.  Daughter at bedside.

## 2017-02-01 ENCOUNTER — Inpatient Hospital Stay (HOSPITAL_COMMUNITY): Payer: Medicare Other

## 2017-02-01 DIAGNOSIS — R609 Edema, unspecified: Secondary | ICD-10-CM

## 2017-02-01 LAB — COMPREHENSIVE METABOLIC PANEL
ALT: 14 U/L (ref 14–54)
AST: 23 U/L (ref 15–41)
Albumin: 4.3 g/dL (ref 3.5–5.0)
Alkaline Phosphatase: 61 U/L (ref 38–126)
Anion gap: 8 (ref 5–15)
BUN: 43 mg/dL — ABNORMAL HIGH (ref 6–20)
CO2: 26 mmol/L (ref 22–32)
Calcium: 8.9 mg/dL (ref 8.9–10.3)
Chloride: 103 mmol/L (ref 101–111)
Creatinine, Ser: 1.27 mg/dL — ABNORMAL HIGH (ref 0.44–1.00)
GFR calc Af Amer: 46 mL/min — ABNORMAL LOW (ref >60)
GFR calc non Af Amer: 39 mL/min — ABNORMAL LOW (ref >60)
Glucose, Bld: 105 mg/dL — ABNORMAL HIGH (ref 65–99)
Potassium: 4.3 mmol/L (ref 3.5–5.1)
Sodium: 137 mmol/L (ref 135–145)
Total Bilirubin: 0.7 mg/dL (ref 0.3–1.2)
Total Protein: 7 g/dL (ref 6.5–8.1)

## 2017-02-01 LAB — MAGNESIUM: Magnesium: 2.5 mg/dL — ABNORMAL HIGH (ref 1.7–2.4)

## 2017-02-01 LAB — GLUCOSE, CAPILLARY
Glucose-Capillary: 106 mg/dL — ABNORMAL HIGH (ref 65–99)
Glucose-Capillary: 107 mg/dL — ABNORMAL HIGH (ref 65–99)
Glucose-Capillary: 111 mg/dL — ABNORMAL HIGH (ref 65–99)
Glucose-Capillary: 113 mg/dL — ABNORMAL HIGH (ref 65–99)
Glucose-Capillary: 114 mg/dL — ABNORMAL HIGH (ref 65–99)
Glucose-Capillary: 96 mg/dL (ref 65–99)
Glucose-Capillary: 99 mg/dL (ref 65–99)

## 2017-02-01 LAB — DIFFERENTIAL
Basophils Absolute: 0 10*3/uL (ref 0.0–0.1)
Basophils Relative: 1 %
Eosinophils Absolute: 0.3 10*3/uL (ref 0.0–0.7)
Eosinophils Relative: 4 %
Lymphocytes Relative: 31 %
Lymphs Abs: 2.2 10*3/uL (ref 0.7–4.0)
Monocytes Absolute: 0.6 10*3/uL (ref 0.1–1.0)
Monocytes Relative: 9 %
Neutro Abs: 3.8 10*3/uL (ref 1.7–7.7)
Neutrophils Relative %: 55 %

## 2017-02-01 LAB — PREALBUMIN: Prealbumin: 21 mg/dL (ref 18–38)

## 2017-02-01 LAB — CBC
HCT: 29.7 % — ABNORMAL LOW (ref 36.0–46.0)
Hemoglobin: 9.5 g/dL — ABNORMAL LOW (ref 12.0–15.0)
MCH: 30.7 pg (ref 26.0–34.0)
MCHC: 32 g/dL (ref 30.0–36.0)
MCV: 96.1 fL (ref 78.0–100.0)
Platelets: 337 10*3/uL (ref 150–400)
RBC: 3.09 MIL/uL — ABNORMAL LOW (ref 3.87–5.11)
RDW: 13.8 % (ref 11.5–15.5)
WBC: 7 10*3/uL (ref 4.0–10.5)

## 2017-02-01 LAB — PHOSPHORUS: Phosphorus: 4.2 mg/dL (ref 2.5–4.6)

## 2017-02-01 LAB — TRIGLYCERIDES: Triglycerides: 168 mg/dL — ABNORMAL HIGH (ref <150)

## 2017-02-01 MED ORDER — ENOXAPARIN SODIUM 100 MG/ML ~~LOC~~ SOLN
90.0000 mg | Freq: Two times a day (BID) | SUBCUTANEOUS | Status: DC
  Administered 2017-02-01 – 2017-02-05 (×8): 90 mg via SUBCUTANEOUS
  Filled 2017-02-01 (×8): qty 1

## 2017-02-01 MED ORDER — FAT EMULSION 20 % IV EMUL
240.0000 mL | INTRAVENOUS | Status: AC
  Administered 2017-02-01: 240 mL via INTRAVENOUS
  Filled 2017-02-01: qty 250

## 2017-02-01 MED ORDER — INSULIN ASPART 100 UNIT/ML ~~LOC~~ SOLN
0.0000 [IU] | SUBCUTANEOUS | Status: DC
  Administered 2017-02-02 (×3): 1 [IU] via SUBCUTANEOUS
  Administered 2017-02-02: 2 [IU] via SUBCUTANEOUS
  Administered 2017-02-02: 1 [IU] via SUBCUTANEOUS
  Administered 2017-02-03: 2 [IU] via SUBCUTANEOUS
  Administered 2017-02-03: 1 [IU] via SUBCUTANEOUS

## 2017-02-01 MED ORDER — M.V.I. ADULT IV INJ
INTRAVENOUS | Status: AC
  Administered 2017-02-01: 18:00:00 via INTRAVENOUS
  Filled 2017-02-01: qty 1992

## 2017-02-01 NOTE — Care Management Important Message (Signed)
Important Message  Patient Details  Name: Kaylee Mullins MRN: [id number] Date of Birth: 07-Oct-1935   Medicare Important Message Given:  Yes    Orbie Pyo 02/01/2017, 1:36 PM

## 2017-02-01 NOTE — Progress Notes (Signed)
20 Days Post-Op   Subjective/Chief Complaint: Less nausea, but still feeling of fullness.  Continues to pass more gas.     Objective: Vital signs in last 24 hours: Temp:  [98.2 F (36.8 C)-98.5 F (36.9 C)] 98.5 F (36.9 C) (06/18 0404) Pulse Rate:  [72-88] 88 (06/18 0404) Resp:  [19-21] 19 (06/18 0404) BP: (122-156)/(55-70) 122/55 (06/18 0404) SpO2:  [100 %] 100 % (06/18 0404) Last BM Date: 01/31/17  Intake/Output from previous day: 06/17 0701 - 06/18 0700 In: 3983.4 [P.O.:1214; I.V.:2369.4; IV Piggyback:400] Out: 600 [Urine:600] Intake/Output this shift: No intake/output data recorded.  General appearance: cooperative, ambulating with walker. Resp: breathing comfortably GI: soft, NT, nondistended today.   Lab Results:   Recent Labs  01/31/17 0522 02/01/17 0534  WBC 7.7 7.0  HGB 10.6* 9.5*  HCT 33.8* 29.7*  PLT 429* 337   BMET  Recent Labs  01/31/17 0522 02/01/17 0534  NA 135 137  K 4.1 4.3  CL 102 103  CO2 26 26  GLUCOSE 97 105*  BUN 43* 43*  CREATININE 1.45* 1.27*  CALCIUM 8.4* 8.9   PT/INR No results for input(s): LABPROT, INR in the last 72 hours. ABG No results for input(s): PHART, HCO3 in the last 72 hours.  Invalid input(s): PCO2, PO2  Studies/Results: No results found.  Anti-infectives: Anti-infectives    Start     Dose/Rate Route Frequency Ordered Stop   01/31/17 0000  erythromycin ethylsuccinate (EES) 200 MG/5ML suspension 200 mg     200 mg Oral Every 6 hours 01/30/17 1638     01/30/17 1200  erythromycin 250 mg in sodium chloride 0.9 % 100 mL IVPB     250 mg 100 mL/hr over 60 Minutes Intravenous Every 6 hours 01/30/17 1109 01/30/17 2359   01/12/17 1530  ceFAZolin (ANCEF) IVPB 2g/100 mL premix     2 g 200 mL/hr over 30 Minutes Intravenous Every 8 hours 01/12/17 1521 01/12/17 2312   01/12/17 0736  ceFAZolin (ANCEF) 2-4 GM/100ML-% IVPB    Comments:  [name]   : cabinet override      01/12/17 0736 01/12/17 0801   01/12/17  0622  ceFAZolin (ANCEF) IVPB 2g/100 mL premix     2 g 200 mL/hr over 30 Minutes Intravenous On call to O.R. 01/12/17 0622 01/12/17 1158      Assessment/Plan: s/p Procedure(s): LAPAROSCOPY DIAGNOSTIC (N/A) WHIPPLE PROCEDURE (N/A) 01/13/27 (Syniyah Bourne) FEN -clears as tolerated. DM- blood sugars much better around 100.  On lantus and regular insulin in TNA. Severe protein calorie malnutrition - TNA Deconditioning - PT/OT HTN -  On oral medications.  Stable Elevated Creatinine -improved today.   Gastroparesis/Delayed gastric emptying- hoping erythromycin working better.  Hope to advance to full liquids tomorrow.   VTE  prophylaxis- Lovenox RLE edema - check doppler.  Pt high risk for DVT. Pending today.  Work on SNF with TNA.   LOS: 20 days    [facility] 02/01/2017

## 2017-02-01 NOTE — Progress Notes (Signed)
*  PRELIMINARY RESULTS* Vascular Ultrasound Bilateral lower extremity venous duplex has been completed.  Preliminary findings: No evidence of deep vein thrombosis in the right lower extremity. Findings consistent with acute deep vein thrombosis involving the left posterior tibial and peroneal veins lower extremity. Other visualized veins fo the left lower extremity appear negative for thrombosis.  Negative for baker's cysts bilaterally.  Preliminary results given to nurse at 16:00.   Kaylee Mullins 02/01/2017, 4:07 PM

## 2017-02-01 NOTE — Progress Notes (Signed)
ANTICOAGULATION CONSULT NOTE - Initial Consult  Pharmacy Consult for Enoxaparin Indication: LLE DVT  Allergies  Allergen Reactions  . Hydralazine Hcl Shortness Of Breath    Pt has severe respiratory distress after receiving dose  . Darvon [Propoxyphene Hcl] Other (See Comments)    hallucinations  . Nyquil Multi-Symptom [Pseudoeph-Doxylamine-Dm-Apap] Other (See Comments)    Makes pt not "feel right in her head"  . Metformin And Related Diarrhea    Patient Measurements: Height: 5\' 4"  (162.6 cm) Weight: 199 lb (90.3 kg) IBW/kg (Calculated) : 54.7  Vital Signs: Temp: 98.4 F (36.9 C) (06/18 1458) Temp Source: Oral (06/18 1458) BP: 110/69 (06/18 1458) Pulse Rate: 60 (06/18 1458)  Labs:  Recent Labs  01/30/17 0338 01/31/17 0522 02/01/17 0534  HGB 10.6* 10.6* 9.5*  HCT 33.0* 33.8* 29.7*  PLT 439* 429* 337  CREATININE 1.26* 1.45* 1.27*    Estimated Creatinine Clearance: 39.7 mL/min (A) (by C-G formula based on SCr of 1.27 mg/dL (H)).   Medical History: Past Medical History:  Diagnosis Date  . Anxiety   . Arthritis    knees  . Black tarry stools    05-14-16 negative for occult blood with ER visit- noted in [place].  . Cancer [facility])    thyroid cancer- surgery and radiation  . Chronic kidney disease    questionable mass on kidney. Being followed by Dr Diona Fanti  . Complication of anesthesia    heart rate was really low  . Depression   . Diabetes mellitus    type 2  . Full dentures   . Hypertension   . Hypothyroidism   . Pneumonia   . Spinal stenosis   . Stroke ([place]) 09/2014   left sided weakness    Medications:  Scheduled:  . amLODipine  10 mg Oral Daily  . Chlorhexidine Gluconate Cloth  6 each Topical Daily  . cloNIDine  0.2 mg Transdermal Q Fri  . erythromycin ethylsuccinate  200 mg Oral Q6H  . furosemide  40 mg Oral Daily  . irbesartan  300 mg Oral Daily   And  . hydrochlorothiazide  25 mg Oral Daily  . insulin aspart  0-9 Units Subcutaneous Q4H  .  levothyroxine  200 mcg Oral QAC breakfast  . metoprolol tartrate  50 mg Oral BID  . pantoprazole  40 mg Oral QHS  . polyethylene glycol  17 g Oral Daily  . sodium chloride flush  10-40 mL Intracatheter Q12H  . sodium chloride flush  10-40 mL Intracatheter Q12H  . spironolactone  50 mg Oral Daily   Infusions:  . 0.9 % NaCl with KCl 20 mEq / L 10 mL/hr at 02/01/17 0551  . albumin human    . Marland KitchenTPN (CLINIMIX-E) Adult     And  . fat emulsion    . Marland KitchenTPN (CLINIMIX-E) Adult 83 mL/hr at 01/31/17 1801    Assessment: 81 yo F with prolonged hospitalization s/p whipple procedure.  Currently on TPN.  Awaiting SNF placement.  Despite receiving enoxaparin 40mg  SQ daily for VTE prophlaxis pt has developed a LLE DVT.  To increase to treatment dose enoxaparin.  Last dose of enoxaparin 40mg  was 1030 today.  Goal of Therapy:  Anti-Xa level 0.6-1 units/ml 4hrs after LMWH dose given Monitor platelets by anticoagulation protocol: Yes   Plan:  Enoxaparin 90mg  SQ q12h - first dose now. CBC q72 hours  [employer], Pharm.D., BCPS Clinical Pharmacist Pager: [phone] 02/01/2017 5:22 PM

## 2017-02-01 NOTE — Progress Notes (Signed)
Physical Therapy Treatment Patient Details Name: Kaylee Mullins MRN: [id number] DOB: 05-08-1936 Today's Date: 02/01/2017    History of Present Illness This 81 y.o female admitted with cystic distal pancreatic mass.  She underwent  whipple procedure.   Biopsy + for adinocarcinoma.  PMH includes:  CVA, spinal stenosis, PNA, DM, depression, CKD    PT Comments    Pt very pleasant and moving well who was able to perform transfers and increased gait today. Pt plans for ST-SNF for TPN and if able to return home would benefit from aide and HHPT. Pt educated for HEP and gait progression and will continue to work toward independent function acutely.    Follow Up Recommendations  SNF (for IV TPN)     Equipment Recommendations  3in1 (PT)    Recommendations for Other Services       Precautions / Restrictions Precautions Precautions: Fall Restrictions Weight Bearing Restrictions: No    Mobility  Bed Mobility               General bed mobility comments: in chair on arrival  Transfers     Transfers: Sit to/from Stand Sit to Stand: Modified independent (Device/Increase time)         General transfer comment: pt able to stand from chair x 2 with use of bil UE and x 3 without UE assist  Ambulation/Gait Ambulation/Gait assistance: Min guard;Min assist Ambulation Distance (Feet): 300 Feet Assistive device: Rolling walker (2 wheeled);None Gait Pattern/deviations: Step-through pattern;Decreased stride length;Trunk flexed   Gait velocity interpretation: Below normal speed for age/gender General Gait Details: pt walked 250' with Rw with cues for posture, pt walked 50' without RW with limp and decreased stance on LLE from CVA with decreased balance and min assist for safety   Stairs            Wheelchair Mobility    Modified Rankin (Stroke Patients Only)       Balance Overall balance assessment: Needs assistance   Sitting balance-Leahy Scale: Good        Standing balance-Leahy Scale: Fair                              Cognition Arousal/Alertness: Awake/alert Behavior During Therapy: WFL for tasks assessed/performed Overall Cognitive Status: Within Functional Limits for tasks assessed                                        Exercises General Exercises - Lower Extremity Long Arc Quad: AROM;Both;Seated;10 reps Hip ABduction/ADduction: AROM;Both;Seated;10 reps Hip Flexion/Marching: AROM;Both;Seated;10 reps    General Comments        Pertinent Vitals/Pain Pain Assessment: No/denies pain    Home Living                      Prior Function            PT Goals (current goals can now be found in the care plan section) Acute Rehab PT Goals Time For Goal Achievement: 02/15/17 Potential to Achieve Goals: Good Progress towards PT goals: Goals met and updated - see care plan    Frequency    Min 2X/week      PT Plan Current plan remains appropriate    Co-evaluation              AM-PAC PT "6 Clicks" Daily  Activity  Outcome Measure  Difficulty turning over in bed (including adjusting bedclothes, sheets and blankets)?: None Difficulty moving from lying on back to sitting on the side of the bed? : None Difficulty sitting down on and standing up from a chair with arms (e.g., wheelchair, bedside commode, etc,.)?: None Help needed moving to and from a bed to chair (including a wheelchair)?: None Help needed walking in hospital room?: A Little Help needed climbing 3-5 steps with a railing? : A Little 6 Click Score: 22    End of Session Equipment Utilized During Treatment: Gait belt Activity Tolerance: Patient tolerated treatment well Patient left: in chair;with call bell/phone within reach Nurse Communication: Mobility status PT Visit Diagnosis: Difficulty in walking, not elsewhere classified (R26.2);Muscle weakness (generalized) (M62.81)     Time: 4128-7867 PT Time Calculation  (min) (ACUTE ONLY): 22 min  Charges:  $Gait Training: 8-22 mins                    G Codes:        Livvy Spilman B Amerigo Mcglory 02-14-2017, 10:32 AM  [phone]

## 2017-02-01 NOTE — Clinical Social Work Note (Signed)
Pt has no bed offers currently due to TPN need. CSW still waiting on a call back from [facility] to see if they are able to accept pt. Will consult with AD Zack for guidance, if [facility] not available as this pt will be a "Difficult To Place." CSW continuing to follow.   [name], [place], [place] Work  [phone]

## 2017-02-01 NOTE — Progress Notes (Signed)
Called Md and notified about the doppler result. Awaiting orders at this time.

## 2017-02-01 NOTE — Progress Notes (Signed)
PHARMACY - ADULT TOTAL PARENTERAL NUTRITION CONSULT NOTE   Pharmacy Consult:  TPN Indication: Ileus  Patient Measurements: Height: 5\' 4"  (162.6 cm) Weight: 199 lb (90.3 kg) IBW/kg (Calculated) : 54.7 TPN AdjBW (KG): 64.2 Body mass index is 34.16 kg/m.  Assessment:  70 YOF with severe malnutrition related to renal cell and pancreatic cancer who presented with abdominal mass on 01/12/17.  She is s/p Whipple procedure on the same day.  Pharmacy consulted to manage TPN for ileus that prevented oral intake and caused significant nausea and vomiting.  GI: prealbumin improved to 21 (WNL).  No vomiting since 6/17, LBM 6/17.  Erythromycin, PRN Zofran/Phenergan (last QTc 415mc), PPI PO, Miralax.  Plan d/c to SNF on TPN. Endo: hypothyroid on Synthroid.  DM not on med PTA - CBGs tightly controlled despite tapering Lantus (recovering from Whipple procedure) Insulin requirements in the past 24 hours: 0 unit SSI + 60 units in TPN + Lantus 15/d Lytes: all WNL except slightly elevated Mag Renal: SCr down 1.27 (BL SCr ~ 0.8), BUN 43 - NS 20K at 10 ml/hr, UOP 0.3 ml/kg/hr Pulm: stable on RA Cards: HTN - VSS - Lasix 40 daily, spironolactone, Norvasc, irbesartan, HCTZ, Lopressor, clonidine patch incr to 0.2mg , albumin x8 doses Hepatobil: LFTs normalized.  Tbili / TG WNL. Neuro: hx CVA 2016 with residual L-hand and foot weakness ID: afebrile, WBC WNL - not on abx Best Practices: Lovenox 40 TPN Access: triple lumen PICC placed 01/25/17  TPN start date: 01/23/17  Nutritional Goals (per RD recommendation on 6/13): 2000-2200 kCal and 110-120g protein per day  Current Nutrition:  TPN Bariatric clear liquid diet - consuming 0-35% of meals   Plan:   - Continue Clinimix E 5/15 at 83 ml/hr and 20% ILE at 20 ml/hr.  TPN provides 1894 kCal and 100 gm of protein per day, meeting > 90% of total needs. - Daily multivitamin in TPN - Trace elements in TPN every other day d/t shortage, next 6/19 - Reduce SSI to  sensitive Q4H, D/C Lantus, reduce insulin in TPN to 50 units - D/C PO KCL since patient has been refusing med and K is WNL - Cycle TPN once off Lantus and CBGs stable on or off insulin regimen - Consider repeating EKG given multiple meds that could prolong QTc - F/U Doppler, diet advancement   Laine Giovanetti D. Mina Marble, PharmD, BCPS Pager:  [phone] 02/01/2017, 8:11 AM

## 2017-02-02 LAB — GLUCOSE, CAPILLARY
Glucose-Capillary: 124 mg/dL — ABNORMAL HIGH (ref 65–99)
Glucose-Capillary: 127 mg/dL — ABNORMAL HIGH (ref 65–99)
Glucose-Capillary: 139 mg/dL — ABNORMAL HIGH (ref 65–99)
Glucose-Capillary: 151 mg/dL — ABNORMAL HIGH (ref 65–99)
Glucose-Capillary: 146 mg/dL — ABNORMAL HIGH (ref 65–99)

## 2017-02-02 LAB — CBC
HCT: 28.7 % — ABNORMAL LOW (ref 36.0–46.0)
Hemoglobin: 9.1 g/dL — ABNORMAL LOW (ref 12.0–15.0)
MCH: 30.5 pg (ref 26.0–34.0)
MCHC: 31.7 g/dL (ref 30.0–36.0)
MCV: 96.3 fL (ref 78.0–100.0)
Platelets: 304 10*3/uL (ref 150–400)
RBC: 2.98 MIL/uL — ABNORMAL LOW (ref 3.87–5.11)
RDW: 13.8 % (ref 11.5–15.5)
WBC: 6.5 10*3/uL (ref 4.0–10.5)

## 2017-02-02 MED ORDER — OXYCODONE HCL 5 MG/5ML PO SOLN: 5.0000 mg | ORAL | 0 refills | Status: DC | PRN

## 2017-02-02 MED ORDER — FAT EMULSION 20 % IV EMUL
240.0000 mL | INTRAVENOUS | Status: AC
  Administered 2017-02-02: 240 mL via INTRAVENOUS
  Filled 2017-02-02: qty 250

## 2017-02-02 MED ORDER — TRACE MINERALS CR-CU-MN-SE-ZN 10-1000-500-60 MCG/ML IV SOLN
INTRAVENOUS | Status: AC
  Administered 2017-02-02: 19:00:00 via INTRAVENOUS
  Filled 2017-02-02: qty 1992

## 2017-02-02 NOTE — Care Management Note (Signed)
Case Management Note  Patient Details  Name: [name] MRN: [id number] Date of Birth: [birth year]  Subjective/Objective:                    Action/Plan:  Patient discharging to a SNF with TPN. [facility] will be infusion company providing TPN at the SNF . [facility] requesting TPN per [facility] protocol order entered under home health. Same done.  Expected Discharge Date:                  Expected Discharge Plan:  Skilled Nursing Facility  In-House Referral:  Clinical Social Work  Discharge planning Services  CM Consult  Post Acute Care Choice:    Choice offered to:     DME Arranged:    DME Agency:     HH Arranged:    [place] Agency:     Status of Service:  Completed, signed off  If discussed at [employer], dates discussed:    Additional Comments:  Marilu Favre, RN 02/02/2017, 10:32 AM

## 2017-02-02 NOTE — Progress Notes (Signed)
Peripherally Inserted Central Catheter/Midline Placement  Exchanged over same site for DL PICC.  The IV Nurse has discussed with the patient and/or persons authorized to consent for the patient, the purpose of this procedure and the potential benefits and risks involved with this procedure.  The benefits include less needle sticks, lab draws from the catheter, and the patient may be discharged home with the catheter. Risks include, but not limited to, infection, bleeding, blood clot (thrombus formation), and puncture of an artery; nerve damage and irregular heartbeat and possibility to perform a PICC exchange if needed/ordered by physician.  Alternatives to this procedure were also discussed.  Bard Power PICC patient education guide, fact sheet on infection prevention and patient information card has been provided to patient /or left at bedside.    PICC/Midline Placement Documentation  PICC Double Lumen 02/02/17 PICC Right  40 cm 0 cm (Active)  Indication for Insertion or Continuance of Line Home intravenous therapies (PICC only);Administration of hyperosmolar/irritating solutions (i.e. TPN, Vancomycin, etc.) 02/02/2017  6:27 PM  Exposed Catheter (cm) 0 cm 02/02/2017  6:27 PM  Site Assessment Clean;Dry;Intact 02/02/2017  6:27 PM  Lumen #1 Status Flushed;Saline locked;Blood return noted 02/02/2017  6:27 PM  Lumen #2 Status Flushed;Saline locked;Blood return noted 02/02/2017  6:27 PM  Dressing Type Transparent 02/02/2017  6:27 PM  Dressing Status Clean;Dry;Intact 02/02/2017  6:27 PM  Dressing Change Due 02/09/17 02/02/2017  6:27 PM       Gordan Payment 02/02/2017, 6:28 PM

## 2017-02-02 NOTE — Plan of Care (Signed)
Problem: Skin Integrity: Goal: Risk for impaired skin integrity will decrease Outcome: Progressing Skin assessed, no red or areas of breakdown noted.  Pt verbalized understanding of repositioning frequently and reporting any changes in skin integrity to RN.  Patient progressing towards goal.

## 2017-02-02 NOTE — Progress Notes (Signed)
21 Days Post-Op   Subjective/Chief Complaint: Feels full again like she may throw up.  Also, duplex positive for DVT on LEFT Calf.  Objective: Vital signs in last 24 hours: Temp:  [98 F (36.7 C)-98.4 F (36.9 C)] 98.1 F (36.7 C) (06/19 0410) Pulse Rate:  [60] 60 (06/19 0410) Resp:  [18] 18 (06/19 0410) BP: (110-125)/(57-81) 121/57 (06/19 0410) SpO2:  [100 %] 100 % (06/19 0410) Last BM Date: 02/01/17  Intake/Output from previous day: 06/18 0701 - 06/19 0700 In: 2671.9 [P.O.:1240; I.V.:1231.9; IV Piggyback:200] Out: -  Intake/Output this shift: No intake/output data recorded.  General appearance: cooperative, ambulating with walker. Resp: breathing comfortably GI: soft, NT, some distention today.   Lab Results:   Recent Labs  02/01/17 0534 02/02/17 0458  WBC 7.0 6.5  HGB 9.5* 9.1*  HCT 29.7* 28.7*  PLT 337 304   BMET  Recent Labs  01/31/17 0522 02/01/17 0534  NA 135 137  K 4.1 4.3  CL 102 103  CO2 26 26  GLUCOSE 97 105*  BUN 43* 43*  CREATININE 1.45* 1.27*  CALCIUM 8.4* 8.9   PT/INR No results for input(s): LABPROT, INR in the last 72 hours. ABG No results for input(s): PHART, HCO3 in the last 72 hours.  Invalid input(s): PCO2, PO2  Studies/Results: No results found.  Anti-infectives: Anti-infectives    Start     Dose/Rate Route Frequency Ordered Stop   01/31/17 0000  erythromycin ethylsuccinate (EES) 200 MG/5ML suspension 200 mg     200 mg Oral Every 6 hours 01/30/17 1638     01/30/17 1200  erythromycin 250 mg in sodium chloride 0.9 % 100 mL IVPB     250 mg 100 mL/hr over 60 Minutes Intravenous Every 6 hours 01/30/17 1109 01/30/17 2359   01/12/17 1530  ceFAZolin (ANCEF) IVPB 2g/100 mL premix     2 g 200 mL/hr over 30 Minutes Intravenous Every 8 hours 01/12/17 1521 01/12/17 2312   01/12/17 0736  ceFAZolin (ANCEF) 2-4 GM/100ML-% IVPB    Comments:  [name]   : cabinet override      01/12/17 0736 01/12/17 0801   01/12/17 0622   ceFAZolin (ANCEF) IVPB 2g/100 mL premix     2 g 200 mL/hr over 30 Minutes Intravenous On call to O.R. 01/12/17 0622 01/12/17 1158      Assessment/Plan: s/p Procedure(s): LAPAROSCOPY DIAGNOSTIC (N/A) WHIPPLE PROCEDURE (N/A) 01/13/27 ([name]) FEN -clears as tolerated. DM- blood sugars much better around 100.  On lantus and regular insulin in TNA. Severe protein calorie malnutrition - TNA Deconditioning - PT/OT HTN -  On oral medications.  Stable Elevated Creatinine -improved today.   Gastroparesis/Delayed gastric emptying- hoping erythromycin working better.  Still with nausea.  Continue e-mycin, check upper GI.   VTE  tx- lovenox per pharmacy  Work on SNF with TNA.   LOS: 21 days    Kaylee Mullins 02/02/2017

## 2017-02-02 NOTE — Progress Notes (Signed)
PHARMACY - ADULT TOTAL PARENTERAL NUTRITION CONSULT NOTE   Pharmacy Consult:  TPN Indication: Ileus  Patient Measurements: Height: 5\' 4"  (162.6 cm) Weight: 199 lb (90.3 kg) IBW/kg (Calculated) : 54.7 TPN AdjBW (KG): 64.2 Body mass index is 34.16 kg/m.  Assessment:  23 YOF with severe malnutrition related to renal cell and pancreatic cancer who presented with abdominal mass on 01/12/17.  She is s/p Whipple procedure on the same day.  Pharmacy consulted to manage TPN for ileus that prevented oral intake and caused significant nausea and vomiting.  GI: prealbumin improved to 21 (WNL).  No vomiting since 6/17, LBM 6/17.  Erythromycin, PRN Zofran/Phenergan (last QTc 469mc), PPI PO, Miralax.  Plan d/c to SNF on TPN. Endo: hypothyroid on Synthroid.  DM not on med PTA - CBGs tightly controlled, has been weaning insulin (recovering from Whipple procedure) Insulin requirements in the past 24 hours: 0 unit SSI + 50 units in TPN Lytes: all WNL except slightly elevated Mag on 6/18 Renal: SCr down 1.27 (BL SCr ~ 0.8), BUN 43 - NS 20K at [facility], no I/O's Pulm: stable on RA Cards: HTN - VSS - Lasix 40 daily, spironolactone, Norvasc, irbesartan, HCTZ, Lopressor, clonidine patch incr to 0.2mg , albumin x8 doses AC: Lovenox for new DVT - mild anemia, plts WNL Hepatobil: LFTs normalized.  Tbili / TG WNL. Neuro: hx CVA 2016 with residual L-hand and foot weakness ID: afebrile, WBC WNL - not on abx Best Practices: Lovenox 40 TPN Access: triple lumen PICC placed 01/25/17  TPN start date: 01/23/17  Nutritional Goals (per RD recommendation on 6/13): 2000-2200 kCal and 110-120g protein per day  Current Nutrition:  TPN Bariatric clear liquid diet - consuming 0-35% of meals   Plan:   - Continue Clinimix E 5/15 at 83 ml/hr and 20% ILE at 20 ml/hr.  TPN provides 1894 kCal and 100 gm of protein per day, meeting > 90% of total needs. - Daily multivitamin in TPN - Trace elements in TPN every other day d/t shortage,  next 6/19 - Continue sensitive Q4H + reduce insulin in TPN to 40 units - PO KCL D/C'ed 6/18 since patient has been refusing med and K is WNL - Consider repeating EKG given multiple meds that could prolong QTc - F/U diet advancement.  If unable to, cycle TPN once CBGs/insulin regimen are stable. - F/U for potential discharge tomorrow    Winry Egnew D. Mina Marble, PharmD, BCPS Pager:  [phone] 02/02/2017, 12:38 PM

## 2017-02-02 NOTE — Clinical Social Work Note (Addendum)
CSW received call from [facility]) making bed offer for pt. CSW met with pt to address new bed offer. Pt states she would like her daughter-Shelia Irene Pap or friend to tour facility before accepting bed offer. Pt gave CSW permission to contact dtr and pt to contact friend.   CSW called dtr at [phone] and left VM for call back. Per RN Joaquim Lai, pt needing PICC line change, as requested by [facility], which won't occur until late this afternoon. CSW updated Antony Madura, and pharmacy that pt will likely d/c tomorrow, if agreeable to [facility].   4:44pm Per Pam, pt's brother toured [facility] and pt and family are declining [facility]. CSW contacted Nikki at [facility], at [facility] suggestion to see if pt can be reconsidered. Lexine Baton unsure why pt denied initially. Nikki agreed to reevaluate pt information and let CSW know decision tomorrow (Wednesday). Lexine Baton states she only has semi-private rooms available at this time. CSW resent clinicals to [place] through hub and [place] aware pt is ready for discharge. CSW continuing to follow for discharge needs.   [name], [place], [place] Work [phone]

## 2017-02-02 NOTE — Progress Notes (Signed)
[facility]  New TPN pt for DC to SNF.  [facility] TPN pharmacy team is on standby and prepared for DC once family tours SNF and gives "ok" to move forward.  I have spoken with Alena Bills, CSW and she will advise once we have green light from the family to provide TPN.   If patient discharges after hours, please call [phone].   Larry Sierras 02/02/2017, 10:29 AM

## 2017-02-03 ENCOUNTER — Inpatient Hospital Stay (HOSPITAL_COMMUNITY): Payer: Medicare Other

## 2017-02-03 LAB — CBC
HCT: 31.3 % — ABNORMAL LOW (ref 36.0–46.0)
Hemoglobin: 9.8 g/dL — ABNORMAL LOW (ref 12.0–15.0)
MCH: 29.9 pg (ref 26.0–34.0)
MCHC: 31.3 g/dL (ref 30.0–36.0)
MCV: 95.4 fL (ref 78.0–100.0)
Platelets: 326 10*3/uL (ref 150–400)
RBC: 3.28 MIL/uL — ABNORMAL LOW (ref 3.87–5.11)
RDW: 13.7 % (ref 11.5–15.5)
WBC: 7.2 10*3/uL (ref 4.0–10.5)

## 2017-02-03 LAB — GLUCOSE, CAPILLARY
Glucose-Capillary: 187 mg/dL — ABNORMAL HIGH (ref 65–99)
Glucose-Capillary: 188 mg/dL — ABNORMAL HIGH (ref 65–99)
Glucose-Capillary: 198 mg/dL — ABNORMAL HIGH (ref 65–99)
Glucose-Capillary: 215 mg/dL — ABNORMAL HIGH (ref 65–99)
Glucose-Capillary: 217 mg/dL — ABNORMAL HIGH (ref 65–99)
Glucose-Capillary: 221 mg/dL — ABNORMAL HIGH (ref 65–99)

## 2017-02-03 LAB — BASIC METABOLIC PANEL
Anion gap: 7 (ref 5–15)
BUN: 54 mg/dL — ABNORMAL HIGH (ref 6–20)
CO2: 23 mmol/L (ref 22–32)
Calcium: 9.3 mg/dL (ref 8.9–10.3)
Chloride: 103 mmol/L (ref 101–111)
Creatinine, Ser: 1.43 mg/dL — ABNORMAL HIGH (ref 0.44–1.00)
GFR calc Af Amer: 40 mL/min — ABNORMAL LOW (ref >60)
GFR calc non Af Amer: 34 mL/min — ABNORMAL LOW (ref >60)
Glucose, Bld: 225 mg/dL — ABNORMAL HIGH (ref 65–99)
Potassium: 4.3 mmol/L (ref 3.5–5.1)
Sodium: 133 mmol/L — ABNORMAL LOW (ref 135–145)

## 2017-02-03 MED ORDER — SODIUM PHOSPHATES 15 MMOLE/5ML IV SOLN
INTRAVENOUS | Status: DC
  Filled 2017-02-03: qty 1992

## 2017-02-03 MED ORDER — ERYTHROMYCIN ETHYLSUCCINATE 200 MG/5ML PO SUSR
400.0000 mg | Freq: Four times a day (QID) | ORAL | Status: DC
  Administered 2017-02-03 – 2017-02-05 (×8): 400 mg via ORAL
  Filled 2017-02-03 (×10): qty 10

## 2017-02-03 MED ORDER — FAT EMULSION 20 % IV EMUL
240.0000 mL | INTRAVENOUS | Status: AC
  Administered 2017-02-03: 240 mL via INTRAVENOUS
  Filled 2017-02-03: qty 250

## 2017-02-03 MED ORDER — BARIUM SULFATE 2.1 % PO SUSP
ORAL | Status: AC
  Filled 2017-02-03: qty 2

## 2017-02-03 MED ORDER — M.V.I. ADULT IV INJ
INTRAVENOUS | Status: AC
  Administered 2017-02-03: 18:00:00 via INTRAVENOUS
  Filled 2017-02-03: qty 1992

## 2017-02-03 MED ORDER — INSULIN ASPART 100 UNIT/ML ~~LOC~~ SOLN
0.0000 [IU] | SUBCUTANEOUS | Status: DC
  Administered 2017-02-03: 3 [IU] via SUBCUTANEOUS
  Administered 2017-02-03: 5 [IU] via SUBCUTANEOUS
  Administered 2017-02-03: 2 [IU] via SUBCUTANEOUS
  Administered 2017-02-04 (×5): 3 [IU] via SUBCUTANEOUS
  Administered 2017-02-04: 5 [IU] via SUBCUTANEOUS
  Administered 2017-02-05: 1 [IU] via SUBCUTANEOUS
  Administered 2017-02-05: 3 [IU] via SUBCUTANEOUS
  Administered 2017-02-05: 5 [IU] via SUBCUTANEOUS
  Administered 2017-02-05: 2 [IU] via SUBCUTANEOUS

## 2017-02-03 NOTE — Progress Notes (Signed)
Nutrition Follow-up  DOCUMENTATION CODES:   Obesity unspecified, Severe malnutrition in context of chronic illness  INTERVENTION:   -TPN management per pharmacy  NUTRITION DIAGNOSIS:   Malnutrition (Severe) related to chronic illness (renal cell and pancreatic cancer) as evidenced by energy intake < or equal to 75% for > or equal to 1 month, percent weight loss.  Ongoing  GOAL:   Patient will meet greater than or equal to 90% of their needs  Met with TPN  MONITOR:   Diet advancement, PO intake, Supplement acceptance, Weight trends  REASON FOR ASSESSMENT:   Consult New TPN/TNA  ASSESSMENT:   Pt with PMH of DM, renal cell cancer and pancreatic mass s/p ERCP and biopsy postive for cancer symptoms included bloating admitted for new dx of ampullary carcinoma now s/p whipple.   6/15- transferred from SDU to surgical floor 6/19- PICC replaced  Pt sleeping soundly at time of visit. RD did not disturb.   Pt with minimal oral intake related to nausea vomiting. Noted meal completion 0-25%. Pt currently NPO.   Pt remains on TPN support. Currently receivingClinimix E 5/15 at 83 ml/hr and 20% ILE at 20 ml/hr, with daily MVI in TPN. TPN provides 1894 kCal and 100 gm of protein per day, meeting 95% of estimated kcal needs and 91% of estimated protein needs.  Per CSW notes, plan to d/c to SNF tomorrow ([place]) with TPN support.   Labs reviewed: Na: 133, CBGS: 215-221.   Diet Order:  TPN (CLINIMIX-E) Adult Diet NPO time specified TPN [facility] 5/15) Adult with electrolyte additives  Skin:   (closed abdominal incisions)  Last BM:  01/26/17  Height:   Ht Readings from Last 1 Encounters:  01/23/17 5' 4"  (1.626 m)    Weight:   Wt Readings from Last 1 Encounters:  01/29/17 199 lb (90.3 kg)    Ideal Body Weight:  54.5 kg  BMI:  Body mass index is 34.16 kg/m.  Estimated Nutritional Needs:   Kcal:  2000-2200  Protein:  110-120 grams  Fluid:  >/= 2  L/day  EDUCATION NEEDS:   No education needs identified at this time  Saraphina Lauderbaugh A. Jimmye [name], RD, LDN, CDE Pager: [phone] After hours Pager: [phone]

## 2017-02-03 NOTE — Clinical Social Work Note (Addendum)
CSW received call from [facility] ([facility]) confirming bed offer, but needs training on TPN as it has been a year since they've had a TPN pt. CSW called [place] to relay information and Jeannene Patella will reach out to [facility] to coordinate training. Lexine Baton stated once education is received, supplies will be ordered and pt can discharge to facility tomorrow (Thursday). Private room available.   CSW met with pt to address new bed offer. Pt states she was told by Dr. [name] that pt is not ready to discharge. CSW stated CSW will contact Dr. [name] to assure pt is ready for discharge. Pt asked questions about star rating, location, and discharge readiness. CSW addressed all concerns.CSW explained that even if pt is not ready we still need to prepare for discharge and asked that pt arrange to have family member tour [facility] today. Pt stated she would call/ask brother if he can tour today. CSW provided CSW contact information also. Pt did not want give CSW permission to contact dtr or brother.   CSW received call back from Dr. [name] who confirmed plan is for discharge tomorrow morning (Thursday), pending no new occurrences overnight. CSW relayed information to pt. Pt expressed understanding and stated she is still trying got get in touch with her brother to go [place].   3:48pm: Per RN Ginger, pt agreeable to [facility]. CSW continuing to follow for discharge needs.     [name], [place], [place] Work [phone]

## 2017-02-03 NOTE — Progress Notes (Signed)
Patient ID: Kaylee Mullins, female   DOB: 11-08-1935, 81 y.o.   MRN: [id number]   22 Days Post-Op   Subjective/Chief Complaint: Had upper GI.  Not obstructed, but has virtually no motility of stomach.  Vomited when she got back to floor.    Objective: Vital signs in last 24 hours: Temp:  [98.4 F (36.9 C)-98.5 F (36.9 C)] 98.4 F (36.9 C) (06/20 0341) Pulse Rate:  [52-59] 55 (06/20 0341) Resp:  [18-19] 19 (06/20 0341) BP: (114-145)/(55-57) 145/57 (06/20 0341) SpO2:  [99 %] 99 % (06/20 0341) Last BM Date: 02/01/17  Intake/Output from previous day: 06/19 0701 - 06/20 0700 In: 1439.8 [P.O.:342; I.V.:1097.8] Out: -  Intake/Output this shift: No intake/output data recorded.  General appearance: cooperative, ambulating with walker. Resp: breathing comfortably GI: soft, NT, some distention today.   Lab Results:   Recent Labs  02/02/17 0458 02/03/17 0439  WBC 6.5 7.2  HGB 9.1* 9.8*  HCT 28.7* 31.3*  PLT 304 326   BMET  Recent Labs  02/01/17 0534  NA 137  K 4.3  CL 103  CO2 26  GLUCOSE 105*  BUN 43*  CREATININE 1.27*  CALCIUM 8.9   PT/INR No results for input(s): LABPROT, INR in the last 72 hours. ABG No results for input(s): PHART, HCO3 in the last 72 hours.  Invalid input(s): PCO2, PO2  Studies/Results: No results found.  Anti-infectives: Anti-infectives    Start     Dose/Rate Route Frequency Ordered Stop   01/31/17 0000  erythromycin ethylsuccinate (EES) 200 MG/5ML suspension 200 mg     200 mg Oral Every 6 hours 01/30/17 1638     01/30/17 1200  erythromycin 250 mg in sodium chloride 0.9 % 100 mL IVPB     250 mg 100 mL/hr over 60 Minutes Intravenous Every 6 hours 01/30/17 1109 01/30/17 2359   01/12/17 1530  ceFAZolin (ANCEF) IVPB 2g/100 mL premix     2 g 200 mL/hr over 30 Minutes Intravenous Every 8 hours 01/12/17 1521 01/12/17 2312   01/12/17 0736  ceFAZolin (ANCEF) 2-4 GM/100ML-% IVPB    Comments:  [name]   : cabinet override   01/12/17 0736 01/12/17 0801   01/12/17 0622  ceFAZolin (ANCEF) IVPB 2g/100 mL premix     2 g 200 mL/hr over 30 Minutes Intravenous On call to O.R. 01/12/17 0622 01/12/17 1158      Assessment/Plan: s/p Procedure(s): LAPAROSCOPY DIAGNOSTIC (N/A) WHIPPLE PROCEDURE (N/A) 01/13/27 (Kaylee Mullins) FEN -NGT overnight since stomach massively distended. Plan d/c NGT in AM. Keep NPO x ice chips for around 2 weeks.   DM-   On lantus and regular insulin in TNA.  Plan d/c tomorrow with TNA and  Severe protein calorie malnutrition - TNA Deconditioning - PT/OT HTN -  On oral medications.  Stable Elevated Creatinine -improved today.   Gastroparesis/Delayed gastric emptying- erythromycin.  Still with nausea.   VTE  tx- lovenox per pharmacy Dispo:  To SNF tomorrow with TNA.     LOS: 22 days    Kaylee Mullins 02/03/2017

## 2017-02-03 NOTE — Progress Notes (Addendum)
PHARMACY - ADULT TOTAL PARENTERAL NUTRITION CONSULT NOTE   Pharmacy Consult:  TPN Indication: Ileus  Patient Measurements: Height: 5\' 4"  (162.6 cm) Weight: 199 lb (90.3 kg) IBW/kg (Calculated) : 54.7 TPN AdjBW (KG): 64.2 Body mass index is 34.16 kg/m.  Assessment:  70 YOF with severe malnutrition related to renal cell and pancreatic cancer who presented with abdominal mass on 01/12/17.  She is s/p Whipple procedure on the same day.  Pharmacy consulted to manage TPN for ileus that prevented oral intake and caused significant nausea and vomiting.   GI: prealbumin improved to 21 (WNL).  No vomiting since 6/17, LBM 6/18.  Erythromycin, PRN Zofran/Phenergan (last QTc 410mc), PPI PO, Miralax.  Plan d/c to SNF on TPN - refusing current offer. Endo: hypothyroid on Synthroid.  DM not on med PTA - CBGs tightly controlled, has been weaning insulin (recovering from Whipple procedure) Insulin requirements in the past 24 hours: 8 unit SSI + 40 units in TPN Lytes: all WNL except slightly elevated Mag on 6/18 Renal: SCr down 1.27 (BL SCr ~ 0.8), BUN 43 - NS 20K at [facility], no I/O's Pulm: stable on RA Cards: HTN - VSS - Lasix 40 daily, spironolactone, Norvasc, irbesartan, HCTZ, Lopressor, clonidine patch incr to 0.2mg , albumin x8 doses AC: Lovenox for new DVT - mild anemia, plts WNL Hepatobil: LFTs normalized.  Tbili / TG WNL. 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 - Daily  multivitamin in TPN - Trace elements in TPN every other day d/t shortage, next 6/21 - Increase SSI to moderate scale Q4H + continue insulin in TPN at 40 units   Rober Minion, PharmD., MS Clinical Pharmacist Pager:  [phone] Thank you for allowing pharmacy to be part of this patients care team. 02/03/2017, 8:15 AM

## 2017-02-03 NOTE — Discharge Summary (Addendum)
Physician Discharge Summary  Patient ID: Kaylee Mullins MRN: [id number] DOB/AGE: Mar 11, 1936 81 y.o.  Admit date: 01/12/2017 Discharge date: 02/05/2017  Admission Diagnoses: Patient Active Problem List   Diagnosis Date Noted  . Adenocarcinoma ([place]) 01/12/2017  . 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 Essential hypertension 02/11/2016  . Lumbar stenosis with neurogenic claudication 02/10/2016  . Hyperlipidemia 02/10/2016  . Hypothyroidism 02/10/2016  . Musculoskeletal neck pain 12/13/2014  . Muscular pain 12/13/2014  . Numbness   . Acute CVA (cerebrovascular accident) ([place]) 09/16/2014  . Right renal mass 09/13/2014  . Cerebral infarction due to unspecified mechanism   . CVA (cerebral infarction) 09/11/2014  . Osteoarthritis 09/11/2014  . Postsurgical hypothyroidism 09/11/2014  . Uncontrolled hypertension 09/11/2014  . History of CVA (cerebral vascular accident) ([place]) 09/11/2014  . Diabetes mellitus without complication ([place]) 81/08/7508  . Dyslipidemia 04/20/2007    Discharge Diagnoses:  pT3N2 adenocarcinoma of the ampulla, s/p whipple Delayed gastric emptying due to gastroparesis Intolerance of oral diet, on TNA Left popliteal DVT Diabetes with complication of gastroparesis Moderate Protein calorie malnutrition Acute kidney injury And same as above.  Discharged Condition: stable  Hospital Course:  Pt was admitted to the ICU following dx laparoscopy and pancreaticoduodenectomy 01/12/2017.  She had an epidural to assist with pain control.  She did require some fentanyl as well for pain control.  She received albumin boluses which helped her acute kidney injury.  Her blood pressure rapidly went up over several days.  She had a delayed drop in HCT and received 2 units of packed red blood cells.     She had significant hypertension.  She initially received PRN hydralazine and had immediate shortness of breath.  This was placed on her allergy list.  and required a nifedipine  drip for over a week given her inability to tolerate oral intake.  Cardiology was consulted to assist with BP control.    She developed nausea once NGT was removed.  She required increasing doses of lantus for BP control.  She also developed an episode of atrial tachycardia.  Lovenox was started after epidural was removed.  Given her continued inability to tolerate PO, she was started on TNA.  Her diet was attempted to be advanced multiple times beyond clears, but she would get a feeling of "back up into her throat" and would vomit a few days later.  Reglan at lower doses was tried and QTc was followed, but it did not seem to improve her delayed gastric emptying.  She was switched to erythromycin with minimal improvement as well.    Drain output was serous and decreased significantly.  She had normal WBCs and was afebrile, so drains were removed.  Staples were removed as well.    She was found to have bilateral lower extremity edema with the right greater than left.  The patient said it was at baseline, but doppler was ordered.  This demonstrated LEFT posterior tibial and peroneal vein DVT.  Her prophylactic lovenox was changed to therapeutic lovenox.  HCT was stable after elevated dose.    Upper GI was ordered and showed no evidence of obstruction, but full stomach and minimal peristalsis of stomach.  She worked with PT and OT.  She lives alone and it was felt she would need SNF post discharge.  She will need to continue TNA until delayed gastric emptying resolved.  She has pain control with oxycodone elixir.   She will likely have periodic episodes of nausea and vomiting.    Consults:  cardiology, hematology/oncology and anesthesia.  Significant Diagnostic Studies: labs: see epic.  Prior to d/c, Na, K, Cl, Co2 normal.  Cr 1.28, down from 1.43 the day before.  WBCs 7.2 and HCT 31.3  Treatments: cardiac meds: nifedipine, anticoagulation: LMW heparin, insulin: regular and Lantus and surgery: see above.     Discharge Exam: Blood pressure (!) 150/56, pulse 62, temperature 98.1 F (36.7 C), resp. rate 18, height 5\' 4"  (1.626 m), weight 90.3 kg (199 lb), SpO2 100 %. General appearance: alert, cooperative, fatigued and no distress Resp: no distress, breathing comfortably, normal rate Cardio: regular rate and rhythm GI: soft, intermittently with epigastric distention non tender Extremities: BLE edema, warm, well perfused.  Disposition: SNF Discharge Instructions    TPN per pharmacy consult    Complete by:  As directed      Allergies as of 02/04/2017      Reactions   Hydralazine Hcl Shortness Of Breath   Pt has severe respiratory distress after receiving dose   Darvon [propoxyphene Hcl] Other (See Comments)   hallucinations   Nyquil Multi-symptom [pseudoeph-doxylamine-dm-apap] Other (See Comments)   Makes pt not "feel right in her head"   Metformin And Related Diarrhea      Medication List    STOP taking these medications   clopidogrel 75 MG tablet Commonly known as:  PLAVIX   cyanocobalamin 1000 MCG tablet   HUMALOG MIX 50/50 KWIKPEN (50-50) 100 UNIT/ML Kwikpen Generic drug:  Insulin Lispro Prot & Lispro     TAKE these medications   acetaminophen 500 MG tablet Commonly known as:  TYLENOL Take 500-1,000 mg by mouth every 6 (six) hours as needed for mild pain or moderate pain (depends on pain if takes 1-2 tablets).   albuterol (2.5 MG/3ML) 0.083% nebulizer solution Commonly known as:  PROVENTIL Take 3 mLs (2.5 mg total) by nebulization every 4 (four) hours as needed for wheezing or shortness of breath.   amLODipine 10 MG tablet Commonly known as:  NORVASC Take 1 tablet (10 mg total) by mouth daily.   aspirin 81 MG chewable tablet Commonly known as:  ASPIRIN CHILDRENS Chew 1 tablet (81 mg total) by mouth daily.   enoxaparin 100 MG/ML injection Commonly known as:  LOVENOX Inject 0.9 mLs (90 mg total) into the skin every 12 (twelve) hours.   erythromycin  ethylsuccinate 200 MG/5ML suspension Commonly known as:  EES Take 10 mLs (400 mg total) by mouth every 6 (six) hours.   furosemide 40 MG tablet Commonly known as:  LASIX TAKE 1 TABLET BY MOUTH EVERY DAY AS NEEDED FOR LEG SWELLING   insulin aspart 100 UNIT/ML injection Commonly known as:  novoLOG Inject 0-15 Units into the skin every 4 (four) hours.   irbesartan 300 MG tablet Commonly known as:  AVAPRO Take 1 tablet (300 mg total) by mouth daily.   levothyroxine 200 MCG tablet Commonly known as:  SYNTHROID, LEVOTHROID Take 200 mcg by mouth daily before breakfast. For hypothyroidism   metoprolol succinate 100 MG 24 hr tablet Commonly known as:  TOPROL-XL Take 100 mg by mouth daily. Take with or immediately following a meal.   NIFEdipine 30 MG 24 hr tablet Commonly known as:  PROCARDIA-XL/ADALAT CC Take 1 tablet (30 mg total) by mouth 2 (two) times daily as needed (systolic BP over 237 mmHg).   oxyCODONE 5 MG/5ML solution Commonly known as:  ROXICODONE Take 5-10 mLs (5-10 mg total) by mouth every 4 (four) hours as needed for moderate pain or severe pain.  oxyCODONE-acetaminophen 5-325 MG tablet Commonly known as:  PERCOCET/ROXICET Take 1-2 tablets by mouth every 4 (four) hours as needed for moderate pain. What changed:  how much to take  additional instructions   pantoprazole 40 MG tablet Commonly known as:  PROTONIX Take 1 tablet (40 mg total) by mouth at bedtime.   potassium chloride SA 20 MEQ tablet Commonly known as:  K-DUR,KLOR-CON Take 20 mEq by mouth 2 (two) times daily.   rosuvastatin 20 MG tablet Commonly known as:  CRESTOR Take 20 mg by mouth daily.   telmisartan-hydrochlorothiazide 80-25 MG tablet Commonly known as:  MICARDIS HCT Take 1 tablet by mouth daily.      Follow-up Information    Stark Klein, MD Follow up in 2 week(s).   Specialty:  General Surgery Contact information: [address] [place]  [address] [phone]           Signed: Stark Klein 02/04/2017, 8:14 AM

## 2017-02-03 NOTE — Progress Notes (Signed)
Occupational Therapy Treatment Patient Details Name: Kaylee Mullins MRN: [id number] DOB: March 13, 1936 Today's Date: 02/03/2017    History of present illness This 81 y.o female admitted with cystic distal pancreatic mass.  She underwent  whipple procedure.   Biopsy + for adinocarcinoma.  PMH includes:  CVA, spinal stenosis, PNA, DM, depression, CKD   OT comments  Pt making good progress with functional goals. Acute OT will continue to follow  Follow Up Recommendations  SNF;Supervision/Assistance - 24 hour    Equipment Recommendations  3 in 1 bedside commode    Recommendations for Other Services      Precautions / Restrictions Precautions Precautions: Fall Precaution Comments: monitor BP and vitals Restrictions Weight Bearing Restrictions: No       Mobility Bed Mobility Overal bed mobility: Needs Assistance Bed Mobility: Sit to Supine       Sit to supine: Supervision   General bed mobility comments: seated in bathroom upon arrival  Transfers Overall transfer level: Needs assistance Equipment used: Rolling walker (2 wheeled);None;1 person hand held assist Transfers: Sit to/from Stand Sit to Stand: Modified independent (Device/Increase time)         General transfer comment: pa able to stand at sink for ADLs    Balance Overall balance assessment: Needs assistance Sitting-balance support: Feet supported;No upper extremity supported Sitting balance-Leahy Scale: Good     Standing balance support: No upper extremity supported;During functional activity Standing balance-Leahy Scale: Fair Standing balance comment: Able to perform pericare without difficulty in standing.                           ADL either performed or assessed with clinical judgement   ADL       Grooming: Standing;Wash/dry hands;Set up;Supervision/safety;Wash/dry face       Lower Body Bathing: Min guard;Sit to/from stand   Upper Body Dressing : Standing;Min guard                    Functional mobility during ADLs: Min guard General ADL Comments: pt in bathroom seated at sink participating in ADLs with nurse tech upon arrival. Patinet able to stand at sink for groomimg, bathing     Vision Patient Visual Report: No change from baseline                Cognition Arousal/Alertness: Awake/alert Behavior During Therapy: WFL for tasks assessed/performed Overall Cognitive Status: Within Functional Limits for tasks assessed                                                      General Comments  pt very pleasant and cooperative    Pertinent Vitals/ Pain       Pain Assessment: 0-10 Pain Score: 5  Pain Location: abdomen Pain Descriptors / Indicators: Sore Pain Intervention(s): Monitored during session  Home Living                                          Prior Functioning/Environment              Frequency  Min 2X/week        Progress Toward Goals  OT Goals(current goals can now be found in the  care plan section)  Progress towards OT goals: Progressing toward goals     Plan Discharge plan remains appropriate                     AM-PAC PT "6 Clicks" Daily Activity     Outcome Measure   Help from another person eating meals?: None Help from another person taking care of personal grooming?: A Little Help from another person toileting, which includes using toliet, bedpan, or urinal?: A Little Help from another person bathing (including washing, rinsing, drying)?: A Little Help from another person to put on and taking off regular upper body clothing?: A Little Help from another person to put on and taking off regular lower body clothing?: A Lot 6 Click Score: 18    End of Session Equipment Utilized During Treatment: Rolling walker  OT Visit Diagnosis: Unsteadiness on feet (R26.81);Pain   Activity Tolerance Patient tolerated treatment well   Patient Left in bed;with call  bell/phone within reach   Nurse Communication      Functional Assessment Tool Used: AM-PAC 6 Clicks Daily Activity   Time: 5397-6734 OT Time Calculation (min): 20 min  Charges: OT G-codes **NOT FOR INPATIENT CLASS** Functional Assessment Tool Used: AM-PAC 6 Clicks Daily Activity OT Evaluation $OT Eval Low Complexity: 1 Procedure OT Treatments $Self Care/Home Management : 8-22 mins     Britt Bottom 02/03/2017, 1:01 PM

## 2017-02-03 NOTE — Progress Notes (Signed)
PT Cancellation Note  Patient Details Name: Kaylee Mullins MRN: [id number] DOB: [birth year]   Cancelled Treatment:    Reason Eval/Treat Not Completed: Pt politely declined due to "i feel so awful. They're going to put another tube down my nose." RN notified and reports she isn't getting the procedure done. However pt still refused due to "i feel so bad". PT to return as able.   Ashaz Robling M [place] Boyett 02/03/2017, 3:49 PM  Kittie Plater, PT, DPT Pager #: [phone] Office #: [phone]

## 2017-02-04 LAB — COMPREHENSIVE METABOLIC PANEL
ALT: 16 U/L (ref 14–54)
AST: 22 U/L (ref 15–41)
Albumin: 4.7 g/dL (ref 3.5–5.0)
Alkaline Phosphatase: 71 U/L (ref 38–126)
Anion gap: 9 (ref 5–15)
BUN: 52 mg/dL — ABNORMAL HIGH (ref 6–20)
CO2: 23 mmol/L (ref 22–32)
Calcium: 9.6 mg/dL (ref 8.9–10.3)
Chloride: 103 mmol/L (ref 101–111)
Creatinine, Ser: 1.28 mg/dL — ABNORMAL HIGH (ref 0.44–1.00)
GFR calc Af Amer: 45 mL/min — ABNORMAL LOW (ref >60)
GFR calc non Af Amer: 39 mL/min — ABNORMAL LOW (ref >60)
Glucose, Bld: 188 mg/dL — ABNORMAL HIGH (ref 65–99)
Potassium: 4.4 mmol/L (ref 3.5–5.1)
Sodium: 135 mmol/L (ref 135–145)
Total Bilirubin: 0.7 mg/dL (ref 0.3–1.2)
Total Protein: 8 g/dL (ref 6.5–8.1)

## 2017-02-04 LAB — GLUCOSE, CAPILLARY
Glucose-Capillary: 172 mg/dL — ABNORMAL HIGH (ref 65–99)
Glucose-Capillary: 172 mg/dL — ABNORMAL HIGH (ref 65–99)
Glucose-Capillary: 184 mg/dL — ABNORMAL HIGH (ref 65–99)
Glucose-Capillary: 187 mg/dL — ABNORMAL HIGH (ref 65–99)
Glucose-Capillary: 194 mg/dL — ABNORMAL HIGH (ref 65–99)
Glucose-Capillary: 219 mg/dL — ABNORMAL HIGH (ref 65–99)

## 2017-02-04 LAB — MAGNESIUM: Magnesium: 2.4 mg/dL (ref 1.7–2.4)

## 2017-02-04 LAB — PHOSPHORUS: Phosphorus: 4.4 mg/dL (ref 2.5–4.6)

## 2017-02-04 MED ORDER — AMLODIPINE BESYLATE 10 MG PO TABS: 10.0000 mg | ORAL_TABLET | Freq: Every day | ORAL | 0 refills | Status: DC

## 2017-02-04 MED ORDER — PANTOPRAZOLE SODIUM 40 MG PO TBEC: 40.0000 mg | DELAYED_RELEASE_TABLET | Freq: Every day | ORAL | 0 refills | Status: AC

## 2017-02-04 MED ORDER — ENOXAPARIN SODIUM 100 MG/ML ~~LOC~~ SOLN: 90.0000 mg | Freq: Two times a day (BID) | SUBCUTANEOUS | 0 refills | Status: DC

## 2017-02-04 MED ORDER — FAT EMULSION 20 % IV EMUL
240.0000 mL | INTRAVENOUS | Status: AC
  Administered 2017-02-04: 240 mL via INTRAVENOUS
  Filled 2017-02-04: qty 250

## 2017-02-04 MED ORDER — INSULIN ASPART 100 UNIT/ML ~~LOC~~ SOLN: 0.0000 [IU] | SUBCUTANEOUS | 11 refills | Status: DC

## 2017-02-04 MED ORDER — NIFEDIPINE ER 30 MG PO TB24: 30.0000 mg | ORAL_TABLET | Freq: Two times a day (BID) | ORAL | 0 refills | Status: DC | PRN

## 2017-02-04 MED ORDER — TRACE MINERALS CR-CU-MN-SE-ZN 10-1000-500-60 MCG/ML IV SOLN
INTRAVENOUS | Status: DC
  Administered 2017-02-04: 18:00:00 via INTRAVENOUS
  Filled 2017-02-04: qty 1992

## 2017-02-04 MED ORDER — ALBUTEROL SULFATE (2.5 MG/3ML) 0.083% IN NEBU: 2.5000 mg | INHALATION_SOLUTION | RESPIRATORY_TRACT | 12 refills | Status: DC | PRN

## 2017-02-04 MED ORDER — IRBESARTAN 300 MG PO TABS: 300.0000 mg | ORAL_TABLET | Freq: Every day | ORAL | 0 refills | Status: DC

## 2017-02-04 MED ORDER — ASPIRIN 81 MG PO CHEW: 81.0000 mg | CHEWABLE_TABLET | Freq: Every day | ORAL | 11 refills | Status: DC

## 2017-02-04 MED ORDER — ERYTHROMYCIN ETHYLSUCCINATE 200 MG/5ML PO SUSR: 400.0000 mg | Freq: Four times a day (QID) | ORAL | 2 refills | Status: DC

## 2017-02-04 NOTE — Progress Notes (Signed)
Patient refusing 1000 meds at this time, claiming she is nauseous but refusing nausea meds. Will offer meds later and will monitor.

## 2017-02-04 NOTE — Discharge Instructions (Signed)
Stay on small amount of liquids orally.  DO NOT advance diet more often than every 3 days.  She tends to build up fluids in stomach and then vomits copious amount and feels better.     [facility], [place] [phone]  ABDOMINAL SURGERY: POST OP INSTRUCTIONS  Always review your discharge instruction sheet given to you by the facility where your surgery was performed.  IF YOU HAVE DISABILITY OR FAMILY LEAVE FORMS, YOU MUST BRING THEM TO THE OFFICE FOR PROCESSING.  PLEASE DO NOT GIVE THEM TO YOUR DOCTOR.  1. A prescription for pain medication may be given to you upon discharge.  Take your pain medication as prescribed, if needed.  If narcotic pain medicine is not needed, then you may take acetaminophen (Tylenol) or ibuprofen (Advil) as needed. 2. Take your usually prescribed medications unless otherwise directed. 3. If you need a refill on your pain medication, please contact your pharmacy. They will contact our office to request authorization.  Prescriptions will not be filled after 5pm or on week-ends. 4. You should follow a light diet the first few days after arrival home, such as soup and crackers, pudding, etc.unless your doctor has advised otherwise. A high-fiber, low fat diet can be resumed as tolerated.   Be sure to include lots of fluids daily. Most patients will experience some swelling and bruising on the chest and neck area.  Ice packs will help.  Swelling and bruising can take several days to resolve 5. Most patients will experience some swelling and bruising in the area of the incision. Ice pack will help. Swelling and bruising can take several days to resolve..  6. It is common to experience some constipation if taking pain medication after surgery.  Increasing fluid intake and taking a stool softener will usually help or prevent this problem from occurring.  A mild laxative (Milk of Magnesia or Miralax) should be taken according to package directions if there are no  bowel movements after 48 hours. 7.  You may have steri-strips (small skin tapes) in place directly over the incision.  These strips should be left on the skin for 10-14 days.  If your surgeon used skin glue on the incision, you may shower in 48 hours.  The glue will flake off over the next 2-3 weeks.  Any sutures or staples will be removed at the office during your follow-up visit. You may find that a light gauze bandage over your incision may keep your staples from being rubbed or pulled. You may shower and replace the bandage daily. 8. ACTIVITIES:  You may resume regular (light) daily activities beginning the next day--such as daily self-care, walking, climbing stairs--gradually increasing activities as tolerated.  You may have sexual intercourse when it is comfortable.  Refrain from any heavy lifting or straining until approved by your doctor. a. You may drive when you no longer are taking prescription pain medication, you can comfortably wear a seatbelt, and you can safely maneuver your car and apply brakes b. Return to Work: __________8 weeks if applicable_________________________ 77. You should see your doctor in the office for a follow-up appointment approximately two weeks after your surgery.  Make sure that you call for this appointment within a day or two after you arrive home to insure a convenient appointment time. OTHER INSTRUCTIONS:  _____________________________________________________________ _____________________________________________________________  WHEN TO CALL YOUR DOCTOR: 1. Fever over 101.0 2. Inability to urinate 3. Nausea and/or vomiting 4. Extreme swelling or bruising 5.  Continued bleeding from incision. 6. Increased pain, redness, or drainage from the incision. 7. Difficulty swallowing or breathing 8. Muscle cramping or spasms. 9. Numbness or tingling in hands or feet or around lips.  The clinic staff is available to answer your questions during regular business  hours.  Please dont hesitate to call and ask to speak to one of the nurses if you have concerns.  For further questions, please visit [email or web address]

## 2017-02-04 NOTE — Care Management Important Message (Signed)
Important Message  Patient Details  Name: Kaylee Mullins MRN: [id number] Date of Birth: 1936-04-26   Medicare Important Message Given:  Yes    Orbie Pyo 02/04/2017, 11:47 AM

## 2017-02-04 NOTE — Progress Notes (Signed)
PHARMACY - ADULT TOTAL PARENTERAL NUTRITION CONSULT NOTE   Pharmacy Consult:  TPN Indication: Ileus  Patient Measurements: Height: 5\' 4"  (162.6 cm) Weight: 199 lb (90.3 kg) IBW/kg (Calculated) : 54.7 TPN AdjBW (KG): 64.2 Body mass index is 34.16 kg/m.  Assessment:  78 YOF with severe malnutrition related to renal cell and pancreatic cancer who presented with abdominal mass on 01/12/17.  She is s/p Whipple procedure on the same day.  Pharmacy consulted to manage TPN for ileus that prevented oral intake and caused significant nausea and vomiting.   GI: prealbumin improved to 21 (WNL).  No vomiting since 6/17, LBM 6/18.  Erythromycin, PRN Zofran/Phenergan (last QTc 422mc), PPI PO, Miralax.  Plan d/c to SNF on TPN - refusing current offer. Endo: hypothyroid on Synthroid.  DM not on med PTA - CBGs tightly controlled, has been weaning insulin (recovering from Whipple procedure) Insulin requirements in the past 24 hours: 13 unit SSI + 40 units in TPN Lytes: all WNL except slightly elevated Mag on 6/18 Renal: SCr down 1.28 (BL SCr ~ 0.8), BUN 52 - NS 20K at [facility], no I/O's Pulm: stable on RA Cards: HTN - VSS - Lasix 40 daily, spironolactone, Norvasc, irbesartan, HCTZ, Lopressor, clonidine patch incr to 0.2mg , albumin x8 doses AC: Lovenox for new DVT - mild anemia, plts WNL Hepatobil: LFTs normalized.  Tbili / TG WNL. 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 - Daily  multivitamin in TPN - Trace elements in TPN every other day d/t shortage, next 6/21 - Increase SSI to moderate scale Q4H + continue insulin in TPN at 40 units   Rober Minion, PharmD., MS Clinical Pharmacist Pager:  [phone] Thank you for allowing pharmacy to be part of this patients care team. 02/04/2017, 12:40 PM

## 2017-02-04 NOTE — Progress Notes (Addendum)
[place] NOTE   Pharmacy Consult for TPN  Plan:  Noted plans for discharge today - [facility] to provide TPN for this patient today after discharge.  Pharmacy will not prepare TPN bag today to avoid waste.    NOTE:  Pt. receiving enoxaparin for DVT - she will need to be continued on this at discharge or change to alternative oral regimen.  Rober Minion, PharmD., MS Clinical Pharmacist Pager:  [phone] Thank you for allowing pharmacy to be part of this patients care team. 02/04/2017,11:07 AM   Addendum:  Noted plans that patient would not be discharged, will continue current regimen this evening.

## 2017-02-04 NOTE — Progress Notes (Signed)
Physical Therapy Treatment Patient Details Name: TAJAH NOGUCHI MRN: [id number] DOB: 1936-01-16 Today's Date: 02/04/2017  PT attempted to see pt x2 today for mobility.  On first visit pt c/o severe nausea and states that "I'm feeling a little cloudy.." and declined OOB mobility.  Returned in PM and pt states she feels much better and that she has been up in her room mod I with RW.  No Further PT needs, will sign off at this time.    Helem Reesor E Penven-Crew 02/04/2017, 2:12 PM
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
## 2017-02-04 NOTE — Progress Notes (Signed)
[facility] will be providing TPN at DC later today. In partnership with [facility] where pt will reside upon DC.  I will work with the [facility] team again to day to support staff is prepared for DC.  Both [facility] and [facility] teams are prepared for DC later today. Conferenced with [name] and [place], CSW at [facility] to ensure all plans are in place.  If patient discharges after hours, please call [phone].   Larry Sierras 02/04/2017, 6:58 AM

## 2017-02-05 ENCOUNTER — Inpatient Hospital Stay (HOSPITAL_COMMUNITY)
Admission: EM | Admit: 2017-02-05 | Discharge: 2017-03-29 | DRG: 326 | Disposition: A | Discharge status: Skilled Nursing Facility | Payer: Medicare Other | Attending: General Surgery | Admitting: General Surgery

## 2017-02-05 ENCOUNTER — Encounter (HOSPITAL_COMMUNITY): Payer: Self-pay

## 2017-02-05 ENCOUNTER — Ambulatory Visit: Payer: Medicare Other | Admitting: Nurse Practitioner

## 2017-02-05 DIAGNOSIS — Z923 Personal history of irradiation: Secondary | ICD-10-CM

## 2017-02-05 DIAGNOSIS — E440 Moderate protein-calorie malnutrition: Secondary | ICD-10-CM | POA: Insufficient documentation

## 2017-02-05 DIAGNOSIS — I82409 Acute embolism and thrombosis of unspecified deep veins of unspecified lower extremity: Secondary | ICD-10-CM

## 2017-02-05 DIAGNOSIS — Z9842 Cataract extraction status, left eye: Secondary | ICD-10-CM

## 2017-02-05 DIAGNOSIS — D62 Acute posthemorrhagic anemia: Secondary | ICD-10-CM | POA: Diagnosis not present

## 2017-02-05 DIAGNOSIS — Z934 Other artificial openings of gastrointestinal tract status: Secondary | ICD-10-CM

## 2017-02-05 DIAGNOSIS — Z85528 Personal history of other malignant neoplasm of kidney: Secondary | ICD-10-CM

## 2017-02-05 DIAGNOSIS — Z86718 Personal history of other venous thrombosis and embolism: Secondary | ICD-10-CM

## 2017-02-05 DIAGNOSIS — K3184 Gastroparesis: Principal | ICD-10-CM | POA: Diagnosis present

## 2017-02-05 DIAGNOSIS — Z794 Long term (current) use of insulin: Secondary | ICD-10-CM

## 2017-02-05 DIAGNOSIS — Z9841 Cataract extraction status, right eye: Secondary | ICD-10-CM

## 2017-02-05 DIAGNOSIS — Z7901 Long term (current) use of anticoagulants: Secondary | ICD-10-CM

## 2017-02-05 DIAGNOSIS — Z885 Allergy status to narcotic agent status: Secondary | ICD-10-CM

## 2017-02-05 DIAGNOSIS — E46 Unspecified protein-calorie malnutrition: Secondary | ICD-10-CM

## 2017-02-05 DIAGNOSIS — E43 Unspecified severe protein-calorie malnutrition: Secondary | ICD-10-CM | POA: Diagnosis present

## 2017-02-05 DIAGNOSIS — R1110 Vomiting, unspecified: Secondary | ICD-10-CM

## 2017-02-05 DIAGNOSIS — N189 Chronic kidney disease, unspecified: Secondary | ICD-10-CM | POA: Diagnosis present

## 2017-02-05 DIAGNOSIS — Z8249 Family history of ischemic heart disease and other diseases of the circulatory system: Secondary | ICD-10-CM

## 2017-02-05 DIAGNOSIS — R1310 Dysphagia, unspecified: Secondary | ICD-10-CM

## 2017-02-05 DIAGNOSIS — Z90411 Acquired partial absence of pancreas: Secondary | ICD-10-CM

## 2017-02-05 DIAGNOSIS — T45515A Adverse effect of anticoagulants, initial encounter: Secondary | ICD-10-CM | POA: Diagnosis not present

## 2017-02-05 DIAGNOSIS — M170 Bilateral primary osteoarthritis of knee: Secondary | ICD-10-CM | POA: Diagnosis present

## 2017-02-05 DIAGNOSIS — I129 Hypertensive chronic kidney disease with stage 1 through stage 4 chronic kidney disease, or unspecified chronic kidney disease: Secondary | ICD-10-CM | POA: Diagnosis present

## 2017-02-05 DIAGNOSIS — Z888 Allergy status to other drugs, medicaments and biological substances status: Secondary | ICD-10-CM

## 2017-02-05 DIAGNOSIS — Z7982 Long term (current) use of aspirin: Secondary | ICD-10-CM

## 2017-02-05 DIAGNOSIS — Z90710 Acquired absence of both cervix and uterus: Secondary | ICD-10-CM

## 2017-02-05 DIAGNOSIS — E1122 Type 2 diabetes mellitus with diabetic chronic kidney disease: Secondary | ICD-10-CM | POA: Diagnosis present

## 2017-02-05 DIAGNOSIS — K30 Functional dyspepsia: Secondary | ICD-10-CM

## 2017-02-05 DIAGNOSIS — K567 Ileus, unspecified: Secondary | ICD-10-CM | POA: Diagnosis not present

## 2017-02-05 DIAGNOSIS — Y838 Other surgical procedures as the cause of abnormal reaction of the patient, or of later complication, without mention of misadventure at the time of the procedure: Secondary | ICD-10-CM | POA: Diagnosis present

## 2017-02-05 DIAGNOSIS — R109 Unspecified abdominal pain: Secondary | ICD-10-CM | POA: Diagnosis not present

## 2017-02-05 DIAGNOSIS — D72829 Elevated white blood cell count, unspecified: Secondary | ICD-10-CM

## 2017-02-05 DIAGNOSIS — D6832 Hemorrhagic disorder due to extrinsic circulating anticoagulants: Secondary | ICD-10-CM | POA: Diagnosis not present

## 2017-02-05 DIAGNOSIS — B373 Candidiasis of vulva and vagina: Secondary | ICD-10-CM | POA: Diagnosis not present

## 2017-02-05 DIAGNOSIS — C241 Malignant neoplasm of ampulla of Vater: Secondary | ICD-10-CM

## 2017-02-05 DIAGNOSIS — I69354 Hemiplegia and hemiparesis following cerebral infarction affecting left non-dominant side: Secondary | ICD-10-CM

## 2017-02-05 DIAGNOSIS — M7981 Nontraumatic hematoma of soft tissue: Secondary | ICD-10-CM | POA: Diagnosis not present

## 2017-02-05 DIAGNOSIS — Z85850 Personal history of malignant neoplasm of thyroid: Secondary | ICD-10-CM

## 2017-02-05 DIAGNOSIS — Z8507 Personal history of malignant neoplasm of pancreas: Secondary | ICD-10-CM

## 2017-02-05 DIAGNOSIS — E890 Postprocedural hypothyroidism: Secondary | ICD-10-CM | POA: Diagnosis present

## 2017-02-05 DIAGNOSIS — K910 Vomiting following gastrointestinal surgery: Secondary | ICD-10-CM | POA: Diagnosis present

## 2017-02-05 DIAGNOSIS — S301XXA Contusion of abdominal wall, initial encounter: Secondary | ICD-10-CM

## 2017-02-05 DIAGNOSIS — I8290 Acute embolism and thrombosis of unspecified vein: Secondary | ICD-10-CM

## 2017-02-05 DIAGNOSIS — N179 Acute kidney failure, unspecified: Secondary | ICD-10-CM | POA: Diagnosis present

## 2017-02-05 LAB — GLUCOSE, CAPILLARY
Glucose-Capillary: 135 mg/dL — ABNORMAL HIGH (ref 65–99)
Glucose-Capillary: 178 mg/dL — ABNORMAL HIGH (ref 65–99)
Glucose-Capillary: 195 mg/dL — ABNORMAL HIGH (ref 65–99)
Glucose-Capillary: 201 mg/dL — ABNORMAL HIGH (ref 65–99)

## 2017-02-05 LAB — COMPREHENSIVE METABOLIC PANEL
ALT: 20 U/L (ref 14–54)
AST: 23 U/L (ref 15–41)
Albumin: 4.6 g/dL (ref 3.5–5.0)
Alkaline Phosphatase: 74 U/L (ref 38–126)
Anion gap: 11 (ref 5–15)
BUN: 62 mg/dL — ABNORMAL HIGH (ref 6–20)
CO2: 21 mmol/L — ABNORMAL LOW (ref 22–32)
Calcium: 9.6 mg/dL (ref 8.9–10.3)
Chloride: 102 mmol/L (ref 101–111)
Creatinine, Ser: 1.66 mg/dL — ABNORMAL HIGH (ref 0.44–1.00)
GFR calc Af Amer: 33 mL/min — ABNORMAL LOW (ref >60)
GFR calc non Af Amer: 28 mL/min — ABNORMAL LOW (ref >60)
Glucose, Bld: 231 mg/dL — ABNORMAL HIGH (ref 65–99)
Potassium: 4.7 mmol/L (ref 3.5–5.1)
Sodium: 134 mmol/L — ABNORMAL LOW (ref 135–145)
Total Bilirubin: 1.1 mg/dL (ref 0.3–1.2)
Total Protein: 8.1 g/dL (ref 6.5–8.1)

## 2017-02-05 LAB — CBC
HCT: 32.7 % — ABNORMAL LOW (ref 36.0–46.0)
Hemoglobin: 10.5 g/dL — ABNORMAL LOW (ref 12.0–15.0)
MCH: 30.4 pg (ref 26.0–34.0)
MCHC: 32.1 g/dL (ref 30.0–36.0)
MCV: 94.8 fL (ref 78.0–100.0)
Platelets: 273 10*3/uL (ref 150–400)
RBC: 3.45 MIL/uL — ABNORMAL LOW (ref 3.87–5.11)
RDW: 13.7 % (ref 11.5–15.5)
WBC: 8.1 10*3/uL (ref 4.0–10.5)

## 2017-02-05 LAB — LIPASE, BLOOD: Lipase: 16 U/L (ref 11–51)

## 2017-02-05 MED ORDER — SODIUM CHLORIDE 0.9 % IV BOLUS (SEPSIS)
500.0000 mL | Freq: Once | INTRAVENOUS | Status: AC
  Administered 2017-02-05: 500 mL via INTRAVENOUS

## 2017-02-05 MED ORDER — IOPAMIDOL (ISOVUE-300) INJECTION 61%
INTRAVENOUS | Status: AC
  Filled 2017-02-05: qty 30

## 2017-02-05 NOTE — Progress Notes (Signed)
PTAR here to take patient to SNF. Family aware of patient being discharged.

## 2017-02-05 NOTE — ED Triage Notes (Addendum)
Per EMS, pt d/c today to [facility] after having whipple surgery 01/12/17. Pt complains of abd pain since having the surgery with 3 episodes of vomiting. 300cc of vomit. Bile colored. Pt has picc line in right arm and was about to start TPN. Pain is left sided in nature. Denies nausea now. VS 140/68, HR 68 regular, RR 16, CBG 235. A&Ox4.

## 2017-02-05 NOTE — ED Notes (Signed)
Pt up to bedside commode

## 2017-02-05 NOTE — ED Provider Notes (Signed)
[facility] DEPT Provider Note   CSN: [id number] Arrival date & time: 02/05/17  2026     History   Chief Complaint Chief Complaint  Patient presents with  . Abdominal Pain    HPI Kaylee Mullins is a 81 y.o. female.  HPI Patient had Whipple procedure done 5/29. Unable to advance diet in the hospital and pick line was placed and patient started on TPN. Patient was discharged 2 days ago. She had a bowel movement yesterday but none today. States she is not passing any gas today. She's been having sharp episodic abdominal pain and multiple episodes of vomiting today. States the vomit is yellow in color. Denies any blood in the vomit. No fever or chills. Denies chest pain or shortness of breath. Past Medical History:  Diagnosis Date  . Anxiety   . Arthritis    knees  . Black tarry stools    05-14-16 negative for occult blood with ER visit- noted in [place].  . Cancer [facility])    thyroid cancer- surgery and radiation  . Chronic kidney disease    questionable mass on kidney. Being followed by Dr Diona Fanti  . Complication of anesthesia    heart rate was really low  . Depression   . Diabetes mellitus    type 2  . Full dentures   . Hypertension   . Hypothyroidism   . Pneumonia   . Spinal stenosis   . Stroke [facility]) 09/2014   left sided weakness    Patient Active Problem List   Diagnosis Date Noted  . Gastroparesis 02/06/2017  . Adenocarcinoma ([place]) 01/12/2017  . 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 Essential hypertension 02/11/2016  . Lumbar stenosis with neurogenic claudication 02/10/2016  . Hyperlipidemia 02/10/2016  . Hypothyroidism 02/10/2016  . Musculoskeletal neck pain 12/13/2014  . Muscular pain 12/13/2014  . Numbness   . Acute CVA (cerebrovascular accident) ([place]) 09/16/2014  . Right renal mass 09/13/2014  . Cerebral infarction due to unspecified mechanism   . CVA (cerebral infarction) 09/11/2014  . Osteoarthritis 09/11/2014  . Postsurgical hypothyroidism  09/11/2014  . Uncontrolled hypertension 09/11/2014  . History of CVA (cerebral vascular accident) ([place]) 09/11/2014  . Diabetes mellitus without complication ([place]) 83/38/2505  . Dyslipidemia 04/20/2007    Past Surgical History:  Procedure Laterality Date  . ABDOMINAL HYSTERECTOMY     partial  . cataracts     Removed  11/2015  bilateral  . COLONOSCOPY W/ POLYPECTOMY    . ERCP N/A 05/07/2016   Procedure: ENDOSCOPIC RETROGRADE CHOLANGIOPANCREATOGRAPHY (ERCP);  Surgeon: Clarene Essex, MD;  Location: [facility] END[facility];  Service: Endoscopy;  Laterality: N/A;  . ERCP N/A 10/16/2016   Procedure: ENDOSCOPIC RETROGRADE CHOLANGIOPANCREATOGRAPHY (ERCP);  Surgeon: Clarene Essex, MD;  Location: [facility] ENDOSCOPY;  Service: Endoscopy;  Laterality: N/A;  . EUS N/A 05/27/2016   Procedure: ESOPHAGEAL ENDOSCOPIC ULTRASOUND (EUS) RADIAL;  Surgeon: Arta Silence, MD;  Location: [facility] ENDOSCOPY;  Service: Endoscopy;  Laterality: N/A;  . FLEXIBLE SIGMOIDOSCOPY  03/29/2012   Procedure: FLEXIBLE SIGMOIDOSCOPY;  Surgeon: Jeryl Columbia, MD;  Location: [facility] ENDOSCOPY;  Service: Endoscopy;  Laterality: N/A;  fleet enema upon arrival  . HOT HEMOSTASIS  03/29/2012   Procedure: HOT HEMOSTASIS (ARGON PLASMA COAGULATION/BICAP);  Surgeon: Jeryl Columbia, MD;  Location: [facility] ENDOSCOPY;  Service: Endoscopy;  Laterality: N/A;  . IR GENERIC HISTORICAL  06/30/2016   IR RADIOLOGIST EVAL & MGMT 06/30/2016 Aletta Edouard, MD [facility] INTERV RAD  . IR GENERIC HISTORICAL  09/09/2016   IR RADIOLOGIST EVAL & MGMT  09/09/2016 Aletta Edouard, MD [facility] INTERV RAD  . LAPAROSCOPY N/A 01/12/2017   Procedure: LAPAROSCOPY DIAGNOSTIC;  Surgeon: Stark Klein, MD;  Location: [place];  Service: General;  Laterality: N/A;  . LUMBAR LAMINECTOMY/DECOMPRESSION MICRODISCECTOMY N/A 02/10/2016   Procedure: Lumbar three- four Laminectomy;  Surgeon: Kristeen Miss, MD;  Location: [place] [facility] NEURO ORS;  Service: Neurosurgery;  Laterality: N/A;  L3-4 Laminectomy  . LUMBAR SPINE SURGERY     1st  surgery "ray cage placed"  . THYROIDECTOMY  2005  . TONSILLECTOMY    . WHIPPLE PROCEDURE N/A 01/12/2017   Procedure: WHIPPLE PROCEDURE;  Surgeon: Stark Klein, MD;  Location: [place];  Service: General;  Laterality: N/A;    OB History    No data available       Home Medications    Prior to Admission medications   Medication Sig Start Date End Date Taking? Authorizing Provider  acetaminophen (TYLENOL) 500 MG tablet Take 500-1,000 mg by mouth every 6 (six) hours as needed for mild pain or moderate pain (depends on pain if takes 1-2 tablets).   Yes [provider]  albuterol (PROVENTIL) (2.5 MG/3ML) 0.083% nebulizer solution Take 3 mLs (2.5 mg total) by nebulization every 4 (four) hours as needed for wheezing or shortness of breath. 02/04/17  Yes Stark Klein, MD  amLODipine (NORVASC) 10 MG tablet Take 1 tablet (10 mg total) by mouth daily. 02/04/17  Yes Stark Klein, MD  aspirin (ASPIRIN CHILDRENS) 81 MG chewable tablet Chew 1 tablet (81 mg total) by mouth daily. 02/04/17 02/04/18 Yes Stark Klein, MD  enoxaparin (LOVENOX) 100 MG/ML injection Inject 0.9 mLs (90 mg total) into the skin every 12 (twelve) hours. 02/04/17  Yes Stark Klein, MD  erythromycin ethylsuccinate (EES) 200 MG/5ML suspension Take 10 mLs (400 mg total) by mouth every 6 (six) hours. 02/04/17  Yes Stark Klein, MD  furosemide (LASIX) 40 MG tablet Take 40 mg by mouth daily as needed (leg swelling).   Yes [provider]  insulin aspart (NOVOLOG) 100 UNIT/ML injection Inject 0-15 Units into the skin every 4 (four) hours. 02/04/17  Yes Stark Klein, MD  irbesartan (AVAPRO) 300 MG tablet Take 1 tablet (300 mg total) by mouth daily. 02/04/17  Yes Stark Klein, MD  levothyroxine (SYNTHROID, LEVOTHROID) 200 MCG tablet Take 200 mcg by mouth daily before breakfast. For hypothyroidism 11/11/15  Yes [provider]  metoprolol succinate (TOPROL-XL) 100 MG 24 hr tablet Take 100 mg by mouth daily. Take with or  immediately following a meal.    Yes [provider]  NIFEdipine (PROCARDIA-XL/ADALAT CC) 30 MG 24 hr tablet Take 1 tablet (30 mg total) by mouth 2 (two) times daily as needed (systolic BP over 161 mmHg). 02/04/17  Yes Stark Klein, MD  oxyCODONE (ROXICODONE) 5 MG/5ML solution Take 5-10 mLs (5-10 mg total) by mouth every 4 (four) hours as needed for moderate pain or severe pain. 02/02/17  Yes Stark Klein, MD  oxyCODONE-acetaminophen (PERCOCET/ROXICET) 5-325 MG tablet Take 1-2 tablets by mouth every 4 (four) hours as needed for moderate pain. Patient taking differently: Take 0.5-1 tablets by mouth every 4 (four) hours as needed for moderate pain. Depends on pain if takes 0.5-1 tablet 02/14/16  Yes Elsner, Mallie Mussel, MD  pantoprazole (PROTONIX) 40 MG tablet Take 1 tablet (40 mg total) by mouth at bedtime. 02/04/17  Yes Stark Klein, MD  potassium chloride SA (K-DUR,KLOR-CON) 20 MEQ tablet Take 20 mEq by mouth 2 (two) times daily.   Yes [provider]  rosuvastatin (CRESTOR)  20 MG tablet Take 20 mg by mouth daily.  05/02/16  Yes [provider]  telmisartan-hydrochlorothiazide (MICARDIS HCT) 80-25 MG tablet Take 1 tablet by mouth daily.  05/01/16  Yes [provider]    Family History Family History  Problem Relation Age of Onset  . Heart failure Mother   . Heart attack Father     Social History Social History  Substance Use Topics  . Smoking status: Never Smoker  . Smokeless tobacco: Never Used  . Alcohol use No     Allergies   Hydralazine hcl; Darvon [propoxyphene hcl]; Nyquil multi-symptom [pseudoeph-doxylamine-dm-apap]; and Metformin and related   Review of Systems Review of Systems  Constitutional: Positive for appetite change and fatigue. Negative for chills and fever.  Respiratory: Negative for cough and shortness of breath.   Cardiovascular: Positive for leg swelling. Negative for chest pain and palpitations.  Gastrointestinal: Positive for  abdominal distention, abdominal pain, nausea and vomiting. Negative for blood in stool, constipation and diarrhea.  Genitourinary: Negative for dysuria, flank pain and frequency.  Musculoskeletal: Negative for back pain, myalgias, neck pain and neck stiffness.  Skin: Negative for rash and wound.  Neurological: Negative for dizziness, weakness, light-headedness, numbness and headaches.  All other systems reviewed and are negative.    Physical Exam Updated Vital Signs BP (!) 138/52 (BP Location: Left Arm)   Pulse 64   Temp 98.4 F (36.9 C) (Oral)   Resp 18   Ht 5\' 4"  (1.626 m)   Wt 89.9 kg (198 lb 3.1 oz)   SpO2 100%   BMI 34.02 kg/m   Physical Exam  Constitutional: She is oriented to person, place, and time. She appears well-developed and well-nourished. No distress.  HENT:  Head: Normocephalic and atraumatic.  Mouth/Throat: Oropharynx is clear and moist.  Dry mucous membranes  Eyes: EOM are normal. Pupils are equal, round, and reactive to light.  Neck: Normal range of motion. Neck supple. No JVD present.  Cardiovascular: Normal rate and regular rhythm.  Exam reveals no gallop and no friction rub.   No murmur heard. Pulmonary/Chest: Effort normal and breath sounds normal. No respiratory distress. She has no wheezes. She has no rales. She exhibits no tenderness.  Abdominal: Soft. Bowel sounds are normal. There is no tenderness. There is no rebound and no guarding.  Surgical scars appear well healing. Patient has mild tenderness to palpation in the right periumbilical region. There is no rebound or guarding. Hyperactive bowel sounds present.  Musculoskeletal: Normal range of motion. She exhibits edema. She exhibits no tenderness.  Bilateral lower extremity edema left greater than right. No calf tenderness. Distal pulses intact.  Neurological: She is alert and oriented to person, place, and time.  Moving all extremities without focal deficit. Sensation intact.  Skin: Skin is warm  and dry. Capillary refill takes less than 2 seconds. No rash noted. No erythema.  Psychiatric: She has a normal mood and affect. Her behavior is normal.  Nursing note and vitals reviewed.    ED Treatments / Results  Labs (all labs ordered are listed, but only abnormal results are displayed) Labs Reviewed  COMPREHENSIVE METABOLIC PANEL - Abnormal; Notable for the following:       Result Value   Sodium 134 (*)    CO2 21 (*)    Glucose, Bld 231 (*)    BUN 62 (*)    Creatinine, Ser 1.66 (*)    GFR calc non Af Amer 28 (*)    GFR calc Af Wyvonnia Lora  33 (*)    All other components within normal limits  CBC - Abnormal; Notable for the following:    RBC 3.45 (*)    Hemoglobin 10.5 (*)    HCT 32.7 (*)    All other components within normal limits  URINALYSIS, ROUTINE W REFLEX MICROSCOPIC - Abnormal; Notable for the following:    APPearance HAZY (*)    All other components within normal limits  COMPREHENSIVE METABOLIC PANEL - Abnormal; Notable for the following:    CO2 20 (*)    Glucose, Bld 144 (*)    BUN 62 (*)    Creatinine, Ser 1.62 (*)    GFR calc non Af Amer 29 (*)    GFR calc Af Amer 34 (*)    All other components within normal limits  CBC - Abnormal; Notable for the following:    RBC 3.15 (*)    Hemoglobin 9.7 (*)    HCT 30.3 (*)    All other components within normal limits  GLUCOSE, CAPILLARY - Abnormal; Notable for the following:    Glucose-Capillary 202 (*)    All other components within normal limits  GLUCOSE, CAPILLARY - Abnormal; Notable for the following:    Glucose-Capillary 129 (*)    All other components within normal limits  GLUCOSE, CAPILLARY - Abnormal; Notable for the following:    Glucose-Capillary 188 (*)    All other components within normal limits  GLUCOSE, CAPILLARY - Abnormal; Notable for the following:    Glucose-Capillary 172 (*)    All other components within normal limits  GLUCOSE, CAPILLARY - Abnormal; Notable for the following:    Glucose-Capillary  193 (*)    All other components within normal limits  LIPASE, BLOOD  MAGNESIUM  PHOSPHORUS  PREALBUMIN  TRIGLYCERIDES  DIFFERENTIAL  BASIC METABOLIC PANEL    EKG  EKG Interpretation None       Radiology Ct Abdomen Pelvis Wo Contrast  Result Date: 02/06/2017 CLINICAL DATA:  Abdominal pain. Status post Whipple Jan 12, 2017. History of gastroparesis. EXAM: CT ABDOMEN AND PELVIS WITHOUT CONTRAST TECHNIQUE: Multidetector CT imaging of the abdomen and pelvis was performed following the standard protocol without IV contrast. Oral contrast administered. COMPARISON:  Upper GI series February 03, 2017 and CT abdomen and pelvis October 23, 2016. FINDINGS: LOWER CHEST: LEFT lower lobe atelectasis/scar. Mild cardiomegaly. Severe coronary artery calcifications versus stent. No pericardial effusion. HEPATOBILIARY: Status post cholecystectomy. Normal noncontrast CT liver, no pneumobilia on today's examination. PANCREAS: Status post Whipple surgery, with pancreatic stent. No focal fluid collection. SPLEEN: Normal. ADRENALS/URINARY TRACT: Kidneys are orthotopic, demonstrating normal size and morphology. No nephrolithiasis, hydronephrosis; limited assessment for renal masses on this nonenhanced examination. 2 cm RIGHT upper pole cyst. The unopacified ureters are normal in course and caliber. Urinary bladder is partially distended and unremarkable. Normal adrenal glands. STOMACH/BOWEL: Retained barium in the gastric cardia results in streak artifact. Mild contrast distended stomach. Asymmetric gastric wall thickening at the level of surgical anastomosis. No contrast extravasation. Small amount of enteric contrast within the jejunum. Retained contrast in the colon which is normal in course and caliber. Mild sigmoid diverticulosis. Distal duodenum is not identified, possibly decompressed. VASCULAR/LYMPHATIC: Aortoiliac vessels are normal in course and caliber. No lymphadenopathy by CT size criteria. REPRODUCTIVE: Status  post hysterectomy . OTHER: No intraperitoneal free fluid or free air. MUSCULOSKELETAL: Non-acute. Small fat containing RIGHT inguinal hernia. Status post L4-5 interbody fusion and laminectomy. Severe L3-4 degenerative disc and grade 1 L3-4 anterolisthesis with severe facet arthropathy consistent  with adjacent segment disease. Severe RIGHT L3-4 neural foraminal narrowing. IMPRESSION: Status post Whipple procedure. Nonspecific eccentric gastric wall thickening at anastomosis,, likely inflammatory given recent surgery . Recommend close attention on follow-up imaging versus endoscopy. Gastric distention retained barium in stomach from 3 days prior consistent with gastroparesis. Pancreatic stent.  No biliary dilatation by noncontrast CT. Electronically Signed   By: Elon Alas M.D.   On: 02/06/2017 00:58    Procedures Procedures (including critical care time)  Medications Ordered in ED Medications  iopamidol (ISOVUE-300) 61 % injection (not administered)  0.9 % NaCl with KCl 20 mEq/ L  infusion (1 mL Intravenous New Bag/Given 02/06/17 1237)  albuterol (PROVENTIL) (2.5 MG/3ML) 0.083% nebulizer solution 2.5 mg (not administered)  amLODipine (NORVASC) tablet 10 mg (10 mg Oral Given 02/06/17 0929)  enoxaparin (LOVENOX) injection 90 mg (90 mg Subcutaneous Given 02/06/17 0928)  erythromycin ethylsuccinate (EES) 200 MG/5ML suspension 400 mg (400 mg Oral Given 02/06/17 1757)  irbesartan (AVAPRO) tablet 300 mg (300 mg Oral Given 02/06/17 0928)  NIFEdipine (PROCARDIA-XL/ADALAT CC) 24 hr tablet 30 mg (not administered)  oxyCODONE (ROXICODONE) 5 MG/5ML solution 5-10 mg (5 mg Oral Not Given 02/06/17 0826)  pantoprazole (PROTONIX) EC tablet 40 mg (not administered)  levothyroxine (SYNTHROID, LEVOTHROID) tablet 200 mcg (200 mcg Oral Given 02/06/17 0812)  acetaminophen (TYLENOL) tablet 650 mg (not administered)    Or  acetaminophen (TYLENOL) suppository 650 mg (not administered)  ondansetron (ZOFRAN-ODT)  disintegrating tablet 4 mg (not administered)    Or  ondansetron (ZOFRAN) injection 4 mg (not administered)  promethazine (PHENERGAN) injection 12.5 mg (not administered)  insulin aspart (novoLOG) injection 0-20 Units (4 Units Subcutaneous Given 02/06/17 2013)  metoprolol succinate (TOPROL-XL) 24 hr tablet 100 mg (100 mg Oral Given 02/06/17 0928)  sodium chloride flush (NS) 0.9 % injection 10-40 mL (not administered)  TPN (CLINIMIX-E) Adult ( Intravenous New Bag/Given 02/06/17 1712)    And  fat emulsion 20 % infusion 240 mL (240 mLs Intravenous New Bag/Given 02/06/17 1712)  HYDROmorphone (DILAUDID) injection 1 mg (1 mg Intravenous Given 02/06/17 1234)  iopamidol (ISOVUE-300) 61 % injection (not administered)  sodium chloride 0.9 % bolus 500 mL (0 mLs Intravenous Stopped 02/05/17 2330)     Initial Impression / Assessment and Plan / ED Course  I have reviewed the triage vital signs and the nursing notes.  Pertinent labs & imaging results that were available during my care of the patient were reviewed by me and considered in my medical decision making (see chart for details).     Signed out to oncoming emergency physician pending CT abdomen. Will likely need surgery consult.  Final Clinical Impressions(s) / ED Diagnoses   Final diagnoses:  Gastroparesis    New Prescriptions Current Discharge Medication List       Julianne Rice, MD 02/06/17 2238

## 2017-02-05 NOTE — Progress Notes (Signed)
PHARMACY - ADULT TOTAL PARENTERAL NUTRITION CONSULT NOTE   Pharmacy Consult:  TPN Indication: Ileus  Patient Measurements: Height: 5\' 4"  (162.6 cm) Weight: 199 lb (90.3 kg) IBW/kg (Calculated) : 54.7 TPN AdjBW (KG): 64.2 Body mass index is 34.16 kg/m.  Assessment:  20 YOF with severe malnutrition related to renal cell and pancreatic cancer who presented with abdominal mass on 01/12/17.  She is s/p Whipple procedure on the same day.  Pharmacy consulted to manage TPN for ileus that prevented oral intake and caused significant nausea and vomiting.   GI: prealbumin improved to 21 (WNL).  No vomiting since 6/17, last BM 6/21.  Erythromycin for gastroparesis/delayed emptying, PRN Zofran/Phenergan (last QTc 471mc), PPI PO, Miralax.  Plan d/c to SNF on TPN. Endo: hypothyroid on Synthroid.  DM not on med PTA - CBGs 172-201 Insulin requirements in the past 24 hours: 16 unit SSI + 40 units in TPN Lytes: all WNL except slightly elevated Mag on 6/18 Renal: SCr down 1.28 (BL SCr ~ 0.8), BUN 52 - NS 20K at [facility], no I/O's Pulm: RA Cards: HTN - VSS - Lasix 40 daily, spironolactone, Norvasc, irbesartan, HCTZ, Lopressor, clonidine patch incr to 0.2mg  AC: Lovenox for new DVT - mild anemia, plts WNL Hepatobil: LFTs normalized.  Tbili / TG WNL. Neuro: hx CVA 2016 with residual L-hand and foot weakness ID: afebrile, WBC WNL  Best Practices: Lovenox  TPN Access: triple lumen PICC placed 01/25/17 - replaced 6/19 TPN start date: 01/23/17  Nutritional Goals (per RD recommendation on 6/13): 2000-2200 kCal 110-120 g protein per day  Current Nutrition:  TPN NPO  Plan:   - Continue Clinimix E 5/15 at 83 ml/hr and 20% ILE at 20 ml/hr for 12 hrs - TPN provides 1894 kCal and 100 gm of protein per day, meeting > 90% of total needs. - Daily multivitamin in TPN - Trace elements in TPN every other day d/t shortage, next 6/23 - Continue moderate SSI Q4H + 40 units insulin in TPN - [facility], consider increasing insulin  amount in TPN to 48 units daily - Plan d/c to SNF today - spoke with CM and SNF will have TPN ready for patient this afternoon so will NOT make a TPN for patient today  Thank you for allowing pharmacy to be part of this patients care team.  Renold Genta, PharmD, BCPS Clinical Pharmacist Phone for today - [place] - [phone] 02/05/2017 10:00 AM

## 2017-02-05 NOTE — Progress Notes (Signed)
[facility]  [name] is an active pt as of today with [facility] team.    [facility] team is providing the TPN for pt at [facility].    [facility] hospital weekend team will follow [name] to support DC back to [facility].  If patient discharges after hours, please call [phone].   Larry Sierras 02/05/2017, 11:29 PM

## 2017-02-05 NOTE — Clinical Social Work Placement (Signed)
   CLINICAL SOCIAL WORK PLACEMENT  NOTE  Date:  02/05/2017  Patient Details  Name: ANASTAZJA ISAAC MRN: [id number] Date of Birth: [birth year]  Clinical Social Work is seeking post-discharge placement for this patient at the [place] level of care (*CSW will initial, date and re-position this form in  chart as items are completed):  Yes   Patient/family provided with [place] Work Department's list of facilities offering this level of care within the geographic area requested by the patient (or if unable, by the patient's family).  Yes   Patient/family informed of their freedom to choose among providers that offer the needed level of care, that participate in Medicare, Medicaid or managed care program needed by the patient, have an available bed and are willing to accept the patient.  Yes   Patient/family informed of [facility]'s ownership interest in [facility] and [facility], as well as of the fact that they are under no obligation to receive care at these facilities.  PASRR submitted to EDS on       PASRR number received on       Existing PASRR number confirmed on 01/25/17     FL2 transmitted to all facilities in geographic area requested by pt/family on 01/25/17     FL2 transmitted to all facilities within larger geographic area on       Patient informed that his/her managed care company has contracts with or will negotiate with certain facilities, including the following:        Yes   Patient/family informed of bed offers received.  Patient chooses bed at [facility]     Physician recommends and patient chooses bed at      Patient to be transferred to [facility] on 02/05/17.  Patient to be transferred to facility by PTAR     Patient family notified on 02/05/17 of transfer.  Name of family member notified:        PHYSICIAN Please prepare prescriptions     Additional Comment:  Pt is ready  for discharge today and will go to [facility]. Pt is aware and agreeable to discharge plan. Pt notified her family and did not need CSW to contact any family. CSW sent clinicals to [facility] and communicated with [facility] (admissions) to assure all equipment and TPN in place. Nikki confirmed. Room and report provided to RN and added to  treatment team sticky note. Transportation arranged with [facility]-spoke with Liliane Channel at 2:33pm. CSW is signing off as no further needs identified. _______________________________________________ Truitt Merle, LCSW 02/05/2017, 3:23 PM

## 2017-02-05 NOTE — Progress Notes (Signed)
Patient ID: Kaylee Mullins, female   DOB: 1935/10/26, 81 y.o.   MRN: [id number]   24 Days Post-Op   Subjective/Chief Complaint: Did not get discharged yesterday due to electricity issues at SNF.  Had a much better day yesterday.  Less nausea, more mobile, two BMs.    Objective: Vital signs in last 24 hours: Temp:  [97.6 F (36.4 C)-99.1 F (37.3 C)] 99.1 F (37.3 C) (06/21 2100) Pulse Rate:  [56-63] 63 (06/21 2100) Resp:  [18] 18 (06/21 2100) BP: (132-150)/(51-56) 132/51 (06/21 2100) SpO2:  [98 %-100 %] 98 % (06/21 2100) Last BM Date: 02/04/17  Intake/Output from previous day: 06/21 0701 - 06/22 0700 In: 854.9 [I.V.:854.9] Out: -  Intake/Output this shift: No intake/output data recorded.  General appearance:  No distress, alert and oriented x 3.  Awake.   Resp: breathing comfortably GI: soft, NT, non distended.     Lab Results:   Recent Labs  02/02/17 0458 02/03/17 0439  WBC 6.5 7.2  HGB 9.1* 9.8*  HCT 28.7* 31.3*  PLT 304 326   BMET  Recent Labs  02/03/17 0903 02/04/17 0434  NA 133* 135  K 4.3 4.4  CL 103 103  CO2 23 23  GLUCOSE 225* 188*  BUN 54* 52*  CREATININE 1.43* 1.28*  CALCIUM 9.3 9.6   PT/INR No results for input(s): LABPROT, INR in the last 72 hours. ABG No results for input(s): PHART, HCO3 in the last 72 hours.  Invalid input(s): PCO2, PO2  Studies/Results: Dg Ugi  W/kub  Result Date: 02/03/2017 CLINICAL DATA:  81 year old female status post Whipple on 01/12/2017. Not tolerating p.o. well, frequent nausea and vomiting. Weakness. EXAM: UPPER GI SERIES WITH KUB TECHNIQUE: After obtaining a scout radiograph a routine upper GI series was performed using thin barium FLUOROSCOPY TIME:  Fluoroscopy Time:  5 minutes 12 seconds Radiation Exposure Index (if provided by the fluoroscopic device): Number of Acquired Spot Images: 0 COMPARISON:  Abdominal radiographs 01/28/2017. Preoperative CT Abdomen and Pelvis 10/23/2016. FINDINGS: Preprocedural scout  view of the abdomen. Paucity of bowel gas, but no dilated loops are evident. In the mid abdomen a curvilinear plastic catheter or stent is in place. This might be at the level of the residual pancreatic duct. Underlying previous lumbar fusion with cage devices. The procedure was performed with the patient inclined on the fluoroscopy table using single contrast technique. The patient did experience nausea during ingestion of the barium, but tolerated the study well. No obstruction to the forward flow of contrast throughout the esophagus and into the stomach. Normal esophageal course and contour. Decreased esophageal motility. As barium began to enter the stomach, it was seen to be diluted into other abundant gastric fluid (series 6). Initially barium was accumulating within the gastric fundus and proximal body. Normal gastric contour in this region was noted. The patient was turned right oblique, and then ultimately right decubitus. Barium flowed into the dependent distal portion of the stomach, and at this point a moderate to severe degree of gastric distention, mostly with radiolucent fluid, became evident. See series 14. Also, degree of gastric ptosis is suspected. See also series 31. No gastric emptying was observed until 25 minutes into the exam. At that point a small volume of barium opacified a nondilated small bowel loop. The small bowel mucosal pattern is normal, but slow, diminished small bowel peristalsis was observed (series 28). The stomach to small bowel anastomosis appears to be at the level of the gastric antrum, best seen on  series 25 and 28, and appears within normal limits. IMPRESSION: 1. Moderately to severely dilated stomach with no gastric motility. Suspect gastroparesis. 2. Delayed gastric emptying, not until about 25 minutes into the exam. 3. Relatively good visualization of the gastrojejunostomy which appears normal without stricture. However, peristalsis in the efferent limb was  slow/diminished. 4. Pancreatic region stent in place, but the biliopancreatic limb was not opacified. 5. Study discussed by telephone with Dr. Stark Klein on 02/03/2017 at 0945 hours. Electronically Signed   By: Genevie Ann M.D.   On: 02/03/2017 10:00    Anti-infectives: Anti-infectives    Start     Dose/Rate Route Frequency Ordered Stop   02/04/17 0000  erythromycin ethylsuccinate (EES) 200 MG/5ML suspension     400 mg Oral Every 6 hours 02/04/17 0813     02/03/17 1200  erythromycin ethylsuccinate (EES) 200 MG/5ML suspension 400 mg     400 mg Oral Every 6 hours 02/03/17 0945     01/31/17 0000  erythromycin ethylsuccinate (EES) 200 MG/5ML suspension 200 mg  Status:  Discontinued     200 mg Oral Every 6 hours 01/30/17 1638 02/03/17 0945   01/30/17 1200  erythromycin 250 mg in sodium chloride 0.9 % 100 mL IVPB     250 mg 100 mL/hr over 60 Minutes Intravenous Every 6 hours 01/30/17 1109 01/30/17 2359   01/12/17 1530  ceFAZolin (ANCEF) IVPB 2g/100 mL premix     2 g 200 mL/hr over 30 Minutes Intravenous Every 8 hours 01/12/17 1521 01/12/17 2312   01/12/17 0736  ceFAZolin (ANCEF) 2-4 GM/100ML-% IVPB    Comments:  [name]   : cabinet override      01/12/17 0736 01/12/17 0801   01/12/17 0622  ceFAZolin (ANCEF) IVPB 2g/100 mL premix     2 g 200 mL/hr over 30 Minutes Intravenous On call to O.R. 01/12/17 0622 01/12/17 1158      Assessment/Plan: s/p Procedure(s): LAPAROSCOPY DIAGNOSTIC (N/A) WHIPPLE PROCEDURE (N/A) 01/13/27 (Remy Voiles) FEN -NPO/TNA.  DM-   On insulin in TNA.   Severe protein calorie malnutrition - TNA Deconditioning - PT/OT HTN -  On oral medications.  Stable Elevated Creatinine -improved today.   Gastroparesis/Delayed gastric emptying- erythromycin.   VTE  tx- lovenox per pharmacy Dispo:  To SNF tomorrow with TNA, npo x ice chips.     LOS: 24 days    Massie Cogliano 02/05/2017

## 2017-02-06 ENCOUNTER — Emergency Department (HOSPITAL_COMMUNITY): Payer: Medicare Other

## 2017-02-06 DIAGNOSIS — D62 Acute posthemorrhagic anemia: Secondary | ICD-10-CM | POA: Diagnosis not present

## 2017-02-06 DIAGNOSIS — E43 Unspecified severe protein-calorie malnutrition: Secondary | ICD-10-CM | POA: Diagnosis present

## 2017-02-06 DIAGNOSIS — M7981 Nontraumatic hematoma of soft tissue: Secondary | ICD-10-CM | POA: Diagnosis not present

## 2017-02-06 DIAGNOSIS — E1122 Type 2 diabetes mellitus with diabetic chronic kidney disease: Secondary | ICD-10-CM | POA: Diagnosis present

## 2017-02-06 DIAGNOSIS — Z923 Personal history of irradiation: Secondary | ICD-10-CM | POA: Diagnosis not present

## 2017-02-06 DIAGNOSIS — N179 Acute kidney failure, unspecified: Secondary | ICD-10-CM | POA: Diagnosis present

## 2017-02-06 DIAGNOSIS — M170 Bilateral primary osteoarthritis of knee: Secondary | ICD-10-CM | POA: Diagnosis present

## 2017-02-06 DIAGNOSIS — Z7982 Long term (current) use of aspirin: Secondary | ICD-10-CM | POA: Diagnosis not present

## 2017-02-06 DIAGNOSIS — Z90710 Acquired absence of both cervix and uterus: Secondary | ICD-10-CM | POA: Diagnosis not present

## 2017-02-06 DIAGNOSIS — Z85850 Personal history of malignant neoplasm of thyroid: Secondary | ICD-10-CM | POA: Diagnosis not present

## 2017-02-06 DIAGNOSIS — Z9842 Cataract extraction status, left eye: Secondary | ICD-10-CM | POA: Diagnosis not present

## 2017-02-06 DIAGNOSIS — I69354 Hemiplegia and hemiparesis following cerebral infarction affecting left non-dominant side: Secondary | ICD-10-CM | POA: Diagnosis not present

## 2017-02-06 DIAGNOSIS — Z7901 Long term (current) use of anticoagulants: Secondary | ICD-10-CM | POA: Diagnosis not present

## 2017-02-06 DIAGNOSIS — Z794 Long term (current) use of insulin: Secondary | ICD-10-CM | POA: Diagnosis not present

## 2017-02-06 DIAGNOSIS — K567 Ileus, unspecified: Secondary | ICD-10-CM | POA: Diagnosis not present

## 2017-02-06 DIAGNOSIS — D6832 Hemorrhagic disorder due to extrinsic circulating anticoagulants: Secondary | ICD-10-CM | POA: Diagnosis not present

## 2017-02-06 DIAGNOSIS — K910 Vomiting following gastrointestinal surgery: Secondary | ICD-10-CM | POA: Diagnosis present

## 2017-02-06 DIAGNOSIS — K3184 Gastroparesis: Secondary | ICD-10-CM | POA: Diagnosis present

## 2017-02-06 DIAGNOSIS — E890 Postprocedural hypothyroidism: Secondary | ICD-10-CM | POA: Diagnosis present

## 2017-02-06 DIAGNOSIS — Y838 Other surgical procedures as the cause of abnormal reaction of the patient, or of later complication, without mention of misadventure at the time of the procedure: Secondary | ICD-10-CM | POA: Diagnosis present

## 2017-02-06 DIAGNOSIS — N189 Chronic kidney disease, unspecified: Secondary | ICD-10-CM | POA: Diagnosis present

## 2017-02-06 DIAGNOSIS — B373 Candidiasis of vulva and vagina: Secondary | ICD-10-CM | POA: Diagnosis not present

## 2017-02-06 DIAGNOSIS — Z9841 Cataract extraction status, right eye: Secondary | ICD-10-CM | POA: Diagnosis not present

## 2017-02-06 DIAGNOSIS — T45515A Adverse effect of anticoagulants, initial encounter: Secondary | ICD-10-CM | POA: Diagnosis not present

## 2017-02-06 DIAGNOSIS — I129 Hypertensive chronic kidney disease with stage 1 through stage 4 chronic kidney disease, or unspecified chronic kidney disease: Secondary | ICD-10-CM | POA: Diagnosis present

## 2017-02-06 DIAGNOSIS — R109 Unspecified abdominal pain: Secondary | ICD-10-CM | POA: Diagnosis present

## 2017-02-06 HISTORY — DX: Gastroparesis: K31.84

## 2017-02-06 LAB — COMPREHENSIVE METABOLIC PANEL
ALT: 18 U/L (ref 14–54)
AST: 23 U/L (ref 15–41)
Albumin: 4.3 g/dL (ref 3.5–5.0)
Alkaline Phosphatase: 74 U/L (ref 38–126)
Anion gap: 12 (ref 5–15)
BUN: 62 mg/dL — ABNORMAL HIGH (ref 6–20)
CO2: 20 mmol/L — ABNORMAL LOW (ref 22–32)
Calcium: 9.1 mg/dL (ref 8.9–10.3)
Chloride: 104 mmol/L (ref 101–111)
Creatinine, Ser: 1.62 mg/dL — ABNORMAL HIGH (ref 0.44–1.00)
GFR calc Af Amer: 34 mL/min — ABNORMAL LOW (ref >60)
GFR calc non Af Amer: 29 mL/min — ABNORMAL LOW (ref >60)
Glucose, Bld: 144 mg/dL — ABNORMAL HIGH (ref 65–99)
Potassium: 4.7 mmol/L (ref 3.5–5.1)
Sodium: 136 mmol/L (ref 135–145)
Total Bilirubin: 0.9 mg/dL (ref 0.3–1.2)
Total Protein: 7.5 g/dL (ref 6.5–8.1)

## 2017-02-06 LAB — PHOSPHORUS: Phosphorus: 4.3 mg/dL (ref 2.5–4.6)

## 2017-02-06 LAB — URINALYSIS, ROUTINE W REFLEX MICROSCOPIC
Bilirubin Urine: NEGATIVE
Glucose, UA: NEGATIVE mg/dL
Hgb urine dipstick: NEGATIVE
Ketones, ur: NEGATIVE mg/dL
Leukocytes, UA: NEGATIVE
Nitrite: NEGATIVE
Protein, ur: NEGATIVE mg/dL
Specific Gravity, Urine: 1.012 (ref 1.005–1.030)
pH: 5 (ref 5.0–8.0)

## 2017-02-06 LAB — CBC
HCT: 30.3 % — ABNORMAL LOW (ref 36.0–46.0)
Hemoglobin: 9.7 g/dL — ABNORMAL LOW (ref 12.0–15.0)
MCH: 30.8 pg (ref 26.0–34.0)
MCHC: 32 g/dL (ref 30.0–36.0)
MCV: 96.2 fL (ref 78.0–100.0)
Platelets: 245 10*3/uL (ref 150–400)
RBC: 3.15 MIL/uL — ABNORMAL LOW (ref 3.87–5.11)
RDW: 14.1 % (ref 11.5–15.5)
WBC: 7 10*3/uL (ref 4.0–10.5)

## 2017-02-06 LAB — DIFFERENTIAL
Basophils Absolute: 0 10*3/uL (ref 0.0–0.1)
Basophils Relative: 0 %
Eosinophils Absolute: 0.1 10*3/uL (ref 0.0–0.7)
Eosinophils Relative: 2 %
Lymphocytes Relative: 30 %
Lymphs Abs: 2.1 10*3/uL (ref 0.7–4.0)
Monocytes Absolute: 0.4 10*3/uL (ref 0.1–1.0)
Monocytes Relative: 6 %
Neutro Abs: 4.4 10*3/uL (ref 1.7–7.7)
Neutrophils Relative %: 62 %

## 2017-02-06 LAB — GLUCOSE, CAPILLARY
Glucose-Capillary: 202 mg/dL — ABNORMAL HIGH (ref 65–99)
Glucose-Capillary: 129 mg/dL — ABNORMAL HIGH (ref 65–99)
Glucose-Capillary: 188 mg/dL — ABNORMAL HIGH (ref 65–99)
Glucose-Capillary: 172 mg/dL — ABNORMAL HIGH (ref 65–99)
Glucose-Capillary: 193 mg/dL — ABNORMAL HIGH (ref 65–99)

## 2017-02-06 LAB — TRIGLYCERIDES: Triglycerides: 91 mg/dL (ref <150)

## 2017-02-06 LAB — PREALBUMIN: Prealbumin: 23.8 mg/dL (ref 18–38)

## 2017-02-06 LAB — MAGNESIUM: Magnesium: 2.2 mg/dL (ref 1.7–2.4)

## 2017-02-06 MED ORDER — IRBESARTAN 300 MG PO TABS
300.0000 mg | ORAL_TABLET | Freq: Every day | ORAL | Status: DC
  Administered 2017-02-06 – 2017-02-09 (×4): 300 mg via ORAL
  Filled 2017-02-06 (×5): qty 1

## 2017-02-06 MED ORDER — SODIUM CHLORIDE 0.9% FLUSH
10.0000 mL | INTRAVENOUS | Status: DC | PRN
  Administered 2017-02-10 – 2017-03-27 (×26): 10 mL
  Filled 2017-02-06 (×26): qty 40

## 2017-02-06 MED ORDER — ERYTHROMYCIN ETHYLSUCCINATE 200 MG/5ML PO SUSR
400.0000 mg | Freq: Four times a day (QID) | ORAL | Status: DC
  Administered 2017-02-06 – 2017-02-07 (×6): 400 mg via ORAL
  Filled 2017-02-06: qty 10
  Filled 2017-02-06: qty 5
  Filled 2017-02-06 (×7): qty 10

## 2017-02-06 MED ORDER — ONDANSETRON 4 MG PO TBDP
4.0000 mg | ORAL_TABLET | Freq: Four times a day (QID) | ORAL | Status: DC | PRN
  Administered 2017-03-22 – 2017-03-23 (×2): 4 mg via ORAL
  Filled 2017-02-06 (×2): qty 1

## 2017-02-06 MED ORDER — ONDANSETRON HCL 4 MG/2ML IJ SOLN
4.0000 mg | Freq: Four times a day (QID) | INTRAMUSCULAR | Status: DC | PRN
  Administered 2017-02-06 – 2017-03-29 (×53): 4 mg via INTRAVENOUS
  Filled 2017-02-06 (×55): qty 2

## 2017-02-06 MED ORDER — IOPAMIDOL (ISOVUE-300) INJECTION 61%
INTRAVENOUS | Status: AC
  Filled 2017-02-06: qty 50

## 2017-02-06 MED ORDER — ACETAMINOPHEN 325 MG PO TABS
650.0000 mg | ORAL_TABLET | Freq: Four times a day (QID) | ORAL | Status: DC | PRN
  Administered 2017-02-14 – 2017-03-19 (×2): 650 mg via ORAL
  Filled 2017-02-06 (×2): qty 2

## 2017-02-06 MED ORDER — METOPROLOL SUCCINATE ER 100 MG PO TB24
100.0000 mg | ORAL_TABLET | Freq: Every day | ORAL | Status: DC
  Administered 2017-02-06 – 2017-03-29 (×45): 100 mg via ORAL
  Filled 2017-02-06 (×49): qty 1

## 2017-02-06 MED ORDER — FAT EMULSION 20 % IV EMUL
240.0000 mL | INTRAVENOUS | Status: AC
  Administered 2017-02-06: 240 mL via INTRAVENOUS
  Filled 2017-02-06: qty 250

## 2017-02-06 MED ORDER — ENOXAPARIN SODIUM 100 MG/ML ~~LOC~~ SOLN
90.0000 mg | Freq: Two times a day (BID) | SUBCUTANEOUS | Status: DC
  Administered 2017-02-06 – 2017-02-14 (×17): 90 mg via SUBCUTANEOUS
  Filled 2017-02-06 (×19): qty 1

## 2017-02-06 MED ORDER — TRACE MINERALS CR-CU-MN-SE-ZN 10-1000-500-60 MCG/ML IV SOLN
INTRAVENOUS | Status: AC
  Administered 2017-02-06: 17:00:00 via INTRAVENOUS
  Filled 2017-02-06: qty 1992

## 2017-02-06 MED ORDER — AMLODIPINE BESYLATE 10 MG PO TABS
10.0000 mg | ORAL_TABLET | Freq: Every day | ORAL | Status: DC
  Administered 2017-02-06 – 2017-02-09 (×4): 10 mg via ORAL
  Filled 2017-02-06 (×5): qty 1

## 2017-02-06 MED ORDER — PANTOPRAZOLE SODIUM 40 MG PO TBEC
40.0000 mg | DELAYED_RELEASE_TABLET | Freq: Every day | ORAL | Status: DC
  Administered 2017-02-06 – 2017-02-09 (×4): 40 mg via ORAL
  Filled 2017-02-06 (×4): qty 1

## 2017-02-06 MED ORDER — OXYCODONE HCL 5 MG/5ML PO SOLN
5.0000 mg | ORAL | Status: DC | PRN
  Administered 2017-02-16 – 2017-02-17 (×3): 10 mg via ORAL
  Filled 2017-02-06: qty 5
  Filled 2017-02-06 (×3): qty 10

## 2017-02-06 MED ORDER — PROMETHAZINE HCL 25 MG/ML IJ SOLN
12.5000 mg | Freq: Four times a day (QID) | INTRAMUSCULAR | Status: DC | PRN
  Administered 2017-02-07 – 2017-03-20 (×33): 12.5 mg via INTRAVENOUS
  Filled 2017-02-06 (×34): qty 1

## 2017-02-06 MED ORDER — ALBUTEROL SULFATE (2.5 MG/3ML) 0.083% IN NEBU: 2.5000 mg | INHALATION_SOLUTION | RESPIRATORY_TRACT | Status: DC | PRN

## 2017-02-06 MED ORDER — NIFEDIPINE ER 30 MG PO TB24
30.0000 mg | ORAL_TABLET | Freq: Two times a day (BID) | ORAL | Status: DC | PRN
  Administered 2017-02-11 – 2017-03-25 (×7): 30 mg via ORAL
  Filled 2017-02-06 (×11): qty 1

## 2017-02-06 MED ORDER — LEVOTHYROXINE SODIUM 100 MCG PO TABS
200.0000 ug | ORAL_TABLET | Freq: Every day | ORAL | Status: DC
  Administered 2017-02-06 – 2017-02-09 (×4): 200 ug via ORAL
  Filled 2017-02-06 (×6): qty 2

## 2017-02-06 MED ORDER — HYDROMORPHONE HCL 1 MG/ML IJ SOLN
1.0000 mg | INTRAMUSCULAR | Status: DC | PRN
  Administered 2017-02-06 – 2017-02-19 (×26): 1 mg via INTRAVENOUS
  Filled 2017-02-06 (×28): qty 1

## 2017-02-06 MED ORDER — POTASSIUM CHLORIDE IN NACL 20-0.9 MEQ/L-% IV SOLN
INTRAVENOUS | Status: DC
  Administered 2017-02-06 (×2): via INTRAVENOUS
  Administered 2017-02-06: 1 mL via INTRAVENOUS
  Administered 2017-02-07 – 2017-02-26 (×20): via INTRAVENOUS
  Administered 2017-02-27: 1 mL via INTRAVENOUS
  Administered 2017-02-28 – 2017-03-01 (×2): via INTRAVENOUS
  Filled 2017-02-06 (×30): qty 1000

## 2017-02-06 MED ORDER — INSULIN ASPART 100 UNIT/ML ~~LOC~~ SOLN
0.0000 [IU] | SUBCUTANEOUS | Status: DC
  Administered 2017-02-06: 3 [IU] via SUBCUTANEOUS
  Administered 2017-02-06 (×3): 4 [IU] via SUBCUTANEOUS
  Administered 2017-02-06: 7 [IU] via SUBCUTANEOUS
  Administered 2017-02-07 (×3): 4 [IU] via SUBCUTANEOUS
  Administered 2017-02-07: 3 [IU] via SUBCUTANEOUS
  Administered 2017-02-07: 4 [IU] via SUBCUTANEOUS
  Administered 2017-02-08 (×2): 3 [IU] via SUBCUTANEOUS
  Administered 2017-02-08 (×2): 4 [IU] via SUBCUTANEOUS
  Administered 2017-02-09: 3 [IU] via SUBCUTANEOUS
  Administered 2017-02-09: 4 [IU] via SUBCUTANEOUS
  Administered 2017-02-09 (×3): 3 [IU] via SUBCUTANEOUS
  Administered 2017-02-10 (×2): 4 [IU] via SUBCUTANEOUS
  Administered 2017-02-10: 3 [IU] via SUBCUTANEOUS
  Administered 2017-02-10 – 2017-02-11 (×5): 4 [IU] via SUBCUTANEOUS

## 2017-02-06 MED ORDER — ACETAMINOPHEN 650 MG RE SUPP: 650.0000 mg | Freq: Four times a day (QID) | RECTAL | Status: DC | PRN

## 2017-02-06 NOTE — Progress Notes (Signed)
PHARMACY - ADULT TOTAL PARENTERAL NUTRITION CONSULT NOTE   Pharmacy Consult:  TPN Indication: Ileus  Patient Measurements: Height: 5\' 4"  (162.6 cm) Weight: 198 lb 3.1 oz (89.9 kg) IBW/kg (Calculated) : 54.7 TPN AdjBW (KG): 63.5 Body mass index is 34.02 kg/m.  Assessment:  59 YOF with severe malnutrition related to renal cell and pancreatic cancer who presented with abdominal mass on 01/12/17.  She is s/p Whipple procedure on the same day.  She was discharged 6/22 and readmitted same day for vomiting at SNF. Pharmacy consulted to manage TPN for ileus that prevented oral intake and caused significant nausea and vomiting.   GI: prealbumin improved to 23.8 (WNL).  Last BM 6/21.  Erythromycin for gastroparesis/delayed emptying, PRN Zofran/Phenergan (last QTc 434mc), PPI PO.  Plan d/c to SNF on TPN. Endo: hypothyroid on Synthroid.  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 - Daily multivitamin in TPN - Trace elements in TPN every other day d/t shortage, due today - Continue resistant SSI q4h + 40 units insulin in TPN,  monitor CBGs - TPN labs per protocol - BMP with am labs - MD, left fluids alone with bump in SCr - please adjust if needed   Thank you for allowing pharmacy to be part of this patients care team.  Renold Genta, PharmD, BCPS Clinical Pharmacist Phone for today - [place] - [phone] 02/06/2017 7:21 AM

## 2017-02-06 NOTE — Progress Notes (Signed)
[facility]/Trauma Progress Note      Subjective: CC: Abdominal pain, nausea, vomiting  Patient states she was up all night with abdominal pain. She is feeling better now. She has not taken pain medication. She had one episode of emesis this morning. No blood in her vomit. No current nausea. She is very tired.  Objective: Vital signs in last 24 hours: Temp:  [98.2 F (36.8 C)-98.7 F (37.1 C)] 98.7 F (37.1 C) (06/23 0330) Pulse Rate:  [63-74] 74 (06/23 0330) Resp:  [14-19] 19 (06/23 0330) BP: (127-171)/(46-85) 168/70 (06/23 0330) SpO2:  [99 %-100 %] 100 % (06/23 0330) Weight:  [198 lb 3.1 oz (89.9 kg)-199 lb (90.3 kg)] 198 lb 3.1 oz (89.9 kg) (06/23 0340) Last BM Date: 02/04/17  Intake/Output from previous day: 06/22 0701 - 06/23 0700 In: 71.7 [I.V.:71.7] Out: -  Intake/Output this shift: No intake/output data recorded.  PE: Gen:  Alert, NAD, pleasant, sleeping Card:  RRR, no M/G/R heard, Pulm:  Rate and effort normal Abd: Soft, not distended, hypoactive bowel sounds, midline incision is well healing without surrounding erythema or edema, no drainage. No TTP Skin: no rashes noted, warm and dry  Lab Results:   Recent Labs  02/05/17 2039 02/06/17 0553  WBC 8.1 7.0  HGB 10.5* 9.7*  HCT 32.7* 30.3*  PLT 273 245   BMET  Recent Labs  02/05/17 2039 02/06/17 0553  NA 134* 136  K 4.7 4.7  CL 102 104  CO2 21* 20*  GLUCOSE 231* 144*  BUN 62* 62*  CREATININE 1.66* 1.62*  CALCIUM 9.6 9.1   PT/INR No results for input(s): LABPROT, INR in the last 72 hours. CMP     Component Value Date/Time   NA 136 02/06/2017 0553   NA 141 11/13/2016 1329   K 4.7 02/06/2017 0553   K 3.5 11/13/2016 1329   CL 104 02/06/2017 0553   CO2 20 (L) 02/06/2017 0553   CO2 24 11/13/2016 1329   GLUCOSE 144 (H) 02/06/2017 0553   GLUCOSE 78 11/13/2016 1329   BUN 62 (H) 02/06/2017 0553   BUN 18.7 11/13/2016 1329   CREATININE 1.62 (H) 02/06/2017 0553   CREATININE 1.0  11/13/2016 1329   CALCIUM 9.1 02/06/2017 0553   CALCIUM 9.3 11/13/2016 1329   PROT 7.5 02/06/2017 0553   PROT 8.0 11/13/2016 1329   ALBUMIN 4.3 02/06/2017 0553   ALBUMIN 3.5 11/13/2016 1329   AST 23 02/06/2017 0553   AST 24 11/13/2016 1329   ALT 18 02/06/2017 0553   ALT 19 11/13/2016 1329   ALKPHOS 74 02/06/2017 0553   ALKPHOS 152 (H) 11/13/2016 1329   BILITOT 0.9 02/06/2017 0553   BILITOT 1.44 (H) 11/13/2016 1329   GFRNONAA 29 (L) 02/06/2017 0553   GFRAA 34 (L) 02/06/2017 0553   Lipase     Component Value Date/Time   LIPASE 16 02/05/2017 2039    Studies/Results: Ct Abdomen Pelvis Wo Contrast  Result Date: 02/06/2017 CLINICAL DATA:  Abdominal pain. Status post Whipple Jan 12, 2017. History of gastroparesis. EXAM: CT ABDOMEN AND PELVIS WITHOUT CONTRAST TECHNIQUE: Multidetector CT imaging of the abdomen and pelvis was performed following the standard protocol without IV contrast. Oral contrast administered. COMPARISON:  Upper GI series February 03, 2017 and CT abdomen and pelvis October 23, 2016. FINDINGS: LOWER CHEST: LEFT lower lobe atelectasis/scar. Mild cardiomegaly. Severe coronary artery calcifications versus stent. No pericardial effusion. HEPATOBILIARY: Status post cholecystectomy. Normal noncontrast CT liver, no pneumobilia on today's examination. PANCREAS: Status post Whipple  surgery, with pancreatic stent. No focal fluid collection. SPLEEN: Normal. ADRENALS/URINARY TRACT: Kidneys are orthotopic, demonstrating normal size and morphology. No nephrolithiasis, hydronephrosis; limited assessment for renal masses on this nonenhanced examination. 2 cm RIGHT upper pole cyst. The unopacified ureters are normal in course and caliber. Urinary bladder is partially distended and unremarkable. Normal adrenal glands. STOMACH/BOWEL: Retained barium in the gastric cardia results in streak artifact. Mild contrast distended stomach. Asymmetric gastric wall thickening at the level of surgical anastomosis.  No contrast extravasation. Small amount of enteric contrast within the jejunum. Retained contrast in the colon which is normal in course and caliber. Mild sigmoid diverticulosis. Distal duodenum is not identified, possibly decompressed. VASCULAR/LYMPHATIC: Aortoiliac vessels are normal in course and caliber. No lymphadenopathy by CT size criteria. REPRODUCTIVE: Status post hysterectomy . OTHER: No intraperitoneal free fluid or free air. MUSCULOSKELETAL: Non-acute. Small fat containing RIGHT inguinal hernia. Status post L4-5 interbody fusion and laminectomy. Severe L3-4 degenerative disc and grade 1 L3-4 anterolisthesis with severe facet arthropathy consistent with adjacent segment disease. Severe RIGHT L3-4 neural foraminal narrowing. IMPRESSION: Status post Whipple procedure. Nonspecific eccentric gastric wall thickening at anastomosis,, likely inflammatory given recent surgery . Recommend close attention on follow-up imaging versus endoscopy. Gastric distention retained barium in stomach from 3 days prior consistent with gastroparesis. Pancreatic stent.  No biliary dilatation by noncontrast CT. Electronically Signed   By: Elon Alas M.D.   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 [name], 01/12/17 Delayed gastric emptying due to gastroparesis Intolerance of oral diet, on TNA - Readmit patient less than 24 hours from prior discharge for emesis.  Home medications  FEN: TPN, H2O and ice chips as tolerated VTE: Continue therapeutic Lovenox dosage for DVT ID: none  DISPO: Home  meds, ambulate. Creatinine stable this am. Repeat labs in the morning to monitor, if creatinine going up may need to switch anticoagulation.    LOS: 0 days    Kalman Drape , [facility] Surgery 02/06/2017, 10:15 AM Pager: [phone] Consults: [phone] Mon-Fri 7:00 am-4:30 pm Sat-Sun 7:00 am-11:30 am

## 2017-02-06 NOTE — Progress Notes (Signed)
Initial Nutrition Assessment  DOCUMENTATION CODES:  Non-severe (moderate) malnutrition in context of acute illness/injury, Obesity unspecified  INTERVENTION:  TPN per pharmacy, current formula meets 100% protein, kcal, fluid  Recommend advancing tolerate as medical able/tolerated.   NUTRITION DIAGNOSIS:  Malnutrition related to chronic illness, acute illness (Whipple and associated complications) evidenced by mild muscle/fat depletion  GOAL:  Patient will meet greater than or equal to 90% of their needs  MONITOR:  PO intake, Diet advancement, Labs, Weight trends  REASON FOR ASSESSMENT:  Consult New TPN/TNA  ASSESSMENT:  81 y/o female PMHHx CKD, DM, Anxiety, Depression, CVA and recent whipple 5/29 after diagnosed with amupllary cancer. Had prolonged postop course due to delayed gastric emptying due to gastroparesis and intolerance of TNA, ultimately started on TPN. Represents from SNF after being discharged for less than 12 hours, due to vomiting and abdominal pain.   Pt today reports that she has not had intolerance to taking her meds/sips of water. No nausea. Still has abdominal pain.   Diet remains NPO. Has been receiving all nutrition via TPN since 6/9. Current formula is 2 Liters of 5/15 @ 83/ hr w/ 20% IVFE 240 mls. Is providing 1894 kcals, 100 g Pro.   Meeting needs with current TPN regimen. En preferred as soon as medically able. Will continue to monitor for diet advancement. TPn per pharmacy until cleared for po intake  Physical Exam: Mild wasting of temporalis, interosseus, quadriceps, Mild fat wasting orbitals/triceps. Mild BLE edema.   Meds: Erythromycin, TPN w/ IVFE Labs:  BG 130-230 BUN/Creat:62/1.62 (increased), Albumin 4.3.    Recent Labs Lab 02/01/17 0534  02/04/17 0434 02/05/17 2039 02/06/17 0553  NA 137  < > 135 134* 136  K 4.3  < > 4.4 4.7 4.7  CL 103  < > 103 102 104  CO2 26  < > 23 21* 20*  BUN 43*  < > 52* 62* 62*  CREATININE 1.27*  < > 1.28*  1.66* 1.62*  CALCIUM 8.9  < > 9.6 9.6 9.1  MG 2.5*  --  2.4  --  2.2  PHOS 4.2  --  4.4  --  4.3  GLUCOSE 105*  < > 188* 231* 144*  < > = values in this interval not displayed.  Diet Order:  TPN (CLINIMIX-E) Adult Diet NPO time specified Except for: Sips with Meds, Ice Chips  Skin: Surgical incisions to abdomen  Last BM:  6/21  Height:  Ht Readings from Last 1 Encounters:  02/06/17 5\' 4"  (1.626 m)   Weight:  Wt Readings from Last 1 Encounters:  02/06/17 198 lb 3.1 oz (89.9 kg)   Wt Readings from Last 10 Encounters:  02/06/17 198 lb 3.1 oz (89.9 kg)  01/29/17 199 lb (90.3 kg)  01/04/17 215 lb 4.8 oz (97.7 kg)  12/02/16 210 lb 12.8 oz (95.6 kg)  11/23/16 207 lb 6.4 oz (94.1 kg)  11/13/16 208 lb 6.4 oz (94.5 kg)  10/27/16 203 lb 6.4 oz (92.3 kg)  10/16/16 204 lb (92.5 kg)  09/16/16 204 lb 9.6 oz (92.8 kg)  09/09/16 200 lb (90.7 kg)   Ideal Body Weight:  54.54 kg  BMI:  Body mass index is 34.02 kg/m.  Estimated Nutritional Needs:  Kcal:  1800-1950 (20-22 kcal/kg bw) Protein:  82-93g Pro (1.5-1.7g/kg ibw) Fluid:  1.8-2 L fluid (1 ml/kcal)  EDUCATION NEEDS:  No education needs identified at this time  Burtis Junes RD, LDN, [place] [facility] Pager: [phone] 02/06/2017 11:31 AM

## 2017-02-06 NOTE — H&P (Signed)
Kaylee Mullins is an 81 y.o. female.   Chief Complaint: vomiting HPI:   81 year old female status post Whipple procedure May 29 for carcinoma who had a prolonged hospital course ultimately due to delayed gastric emptying was transferred to nursing facility yesterday afternoon but on arrival patient vomited and was essentially immediately sent back to this facility. The patient had no worsening abdominal pain, fever, chills, nausea or vomiting. She doesn't really have much abdominal discomfort per se at all. She just vomited. This was sort of her baseline for the past couple weeks since surgery.  The following were her major hospital issues from May 29 through June 22 pT3N2 adenocarcinoma of the ampulla, s/p whipple Delayed gastric emptying due to gastroparesis Intolerance of oral diet, on TNA Left popliteal DVT Diabetes with complication of gastroparesis Moderate Protein calorie malnutrition Acute kidney injury  Past Medical History:  Diagnosis Date  . Anxiety   . Arthritis    knees  . Black tarry stools    05-14-16 negative for occult blood with ER visit- noted in [place].  . Cancer [facility])    thyroid cancer- surgery and radiation  . Chronic kidney disease    questionable mass on kidney. Being followed by Dr Diona Fanti  . Complication of anesthesia    heart rate was really low  . Depression   . Diabetes mellitus    type 2  . Full dentures   . Hypertension   . Hypothyroidism   . Pneumonia   . Spinal stenosis   . Stroke [facility]) 09/2014   left sided weakness    Past Surgical History:  Procedure Laterality Date  . ABDOMINAL HYSTERECTOMY     partial  . cataracts     Removed  11/2015  bilateral  . COLONOSCOPY W/ POLYPECTOMY    . ERCP N/A 05/07/2016   Procedure: ENDOSCOPIC RETROGRADE CHOLANGIOPANCREATOGRAPHY (ERCP);  Surgeon: Clarene Essex, MD;  Location: [facility] ENDOSCOPY;  Service: Endoscopy;  Laterality: N/A;  . ERCP N/A 10/16/2016   Procedure: ENDOSCOPIC RETROGRADE CHOLANGIOPANCREATOGRAPHY  (ERCP);  Surgeon: Clarene Essex, MD;  Location: [facility] ENDOSCOPY;  Service: Endoscopy;  Laterality: N/A;  . EUS N/A 05/27/2016   Procedure: ESOPHAGEAL ENDOSCOPIC ULTRASOUND (EUS) RADIAL;  Surgeon: Arta Silence, MD;  Location: [facility] ENDOSCOPY;  Service: Endoscopy;  Laterality: N/A;  . FLEXIBLE SIGMOIDOSCOPY  03/29/2012   Procedure: FLEXIBLE SIGMOIDOSCOPY;  Surgeon: Jeryl Columbia, MD;  Location: [facility] ENDOSCOPY;  Service: Endoscopy;  Laterality: N/A;  fleet enema upon arrival  . HOT HEMOSTASIS  03/29/2012   Procedure: HOT HEMOSTASIS (ARGON PLASMA COAGULATION/BICAP);  Surgeon: Jeryl Columbia, MD;  Location: [facility] ENDOSCOPY;  Service: Endoscopy;  Laterality: N/A;  . IR GENERIC HISTORICAL  06/30/2016   IR RADIOLOGIST EVAL & MGMT 06/30/2016 Aletta Edouard, MD [facility] INTERV RAD  . IR GENERIC HISTORICAL  09/09/2016   IR RADIOLOGIST EVAL & MGMT 09/09/2016 Aletta Edouard, MD [facility] INTERV RAD  . LAPAROSCOPY N/A 01/12/2017   Procedure: LAPAROSCOPY DIAGNOSTIC;  Surgeon: Stark Klein, MD;  Location: [place];  Service: General;  Laterality: N/A;  . LUMBAR LAMINECTOMY/DECOMPRESSION MICRODISCECTOMY N/A 02/10/2016   Procedure: Lumbar three- four Laminectomy;  Surgeon: Kristeen Miss, MD;  Location: [facility];  Service: Neurosurgery;  Laterality: N/A;  L3-4 Laminectomy  . LUMBAR SPINE SURGERY     1st surgery "ray cage placed"  . THYROIDECTOMY  2005  . TONSILLECTOMY    . WHIPPLE PROCEDURE N/A 01/12/2017   Procedure: WHIPPLE PROCEDURE;  Surgeon: Stark Klein, MD;  Location: [place];  Service: General;  Laterality: N/A;    Family History  Problem Relation Age of Onset  . Heart failure Mother   . Heart attack Father    Social History:  reports that she has never smoked. She has never used smokeless tobacco. She reports that she does not drink alcohol or use drugs.  Allergies:  Allergies  Allergen Reactions  . Hydralazine Hcl Shortness Of Breath    Pt had severe respiratory distress after receiving a dose  . Darvon [Propoxyphene  Hcl] Other (See Comments)    Hallucinations   . Nyquil Multi-Symptom [Pseudoeph-Doxylamine-Dm-Apap] Other (See Comments)    Makes pt not "feel right in her head"  . Metformin And Related Diarrhea    Medications Prior to Admission  Medication Sig Dispense Refill  . acetaminophen (TYLENOL) 500 MG tablet Take 500-1,000 mg by mouth every 6 (six) hours as needed for mild pain or moderate pain (depends on pain if takes 1-2 tablets).    Marland Kitchen albuterol (PROVENTIL) (2.5 MG/3ML) 0.083% nebulizer solution Take 3 mLs (2.5 mg total) by nebulization every 4 (four) hours as needed for wheezing or shortness of breath. 75 mL 12  . amLODipine (NORVASC) 10 MG tablet Take 1 tablet (10 mg total) by mouth daily. 30 tablet 0  . aspirin (ASPIRIN CHILDRENS) 81 MG chewable tablet Chew 1 tablet (81 mg total) by mouth daily. 36 tablet 11  . enoxaparin (LOVENOX) 100 MG/ML injection Inject 0.9 mLs (90 mg total) into the skin every 12 (twelve) hours. 30 Syringe 0  . erythromycin ethylsuccinate (EES) 200 MG/5ML suspension Take 10 mLs (400 mg total) by mouth every 6 (six) hours. 200 mL 2  . furosemide (LASIX) 40 MG tablet TAKE 1 TABLET BY MOUTH EVERY DAY AS NEEDED FOR LEG SWELLING  5  . insulin aspart (NOVOLOG) 100 UNIT/ML injection Inject 0-15 Units into the skin every 4 (four) hours. 10 mL 11  . irbesartan (AVAPRO) 300 MG tablet Take 1 tablet (300 mg total) by mouth daily. 30 tablet 0  . levothyroxine (SYNTHROID, LEVOTHROID) 200 MCG tablet Take 200 mcg by mouth daily before breakfast. For hypothyroidism  99  . metoprolol succinate (TOPROL-XL) 100 MG 24 hr tablet Take 100 mg by mouth daily. Take with or immediately following a meal.     . NIFEdipine (PROCARDIA-XL/ADALAT CC) 30 MG 24 hr tablet Take 1 tablet (30 mg total) by mouth 2 (two) times daily as needed (systolic BP over 485 mmHg). 60 tablet 0  . oxyCODONE (ROXICODONE) 5 MG/5ML solution Take 5-10 mLs (5-10 mg total) by mouth every 4 (four) hours as needed for moderate pain  or severe pain. 473 mL 0  . oxyCODONE-acetaminophen (PERCOCET/ROXICET) 5-325 MG tablet Take 1-2 tablets by mouth every 4 (four) hours as needed for moderate pain. (Patient taking differently: Take 0.5-1 tablets by mouth every 4 (four) hours as needed for moderate pain. Depends on pain if takes 0.5-1 tablet) 60 tablet 0  . pantoprazole (PROTONIX) 40 MG tablet Take 1 tablet (40 mg total) by mouth at bedtime. 30 tablet 0  . potassium chloride SA (K-DUR,KLOR-CON) 20 MEQ tablet Take 20 mEq by mouth 2 (two) times daily.    . rosuvastatin (CRESTOR) 20 MG tablet Take 20 mg by mouth daily.     Marland Kitchen telmisartan-hydrochlorothiazide (MICARDIS HCT) 80-25 MG tablet Take 1 tablet by mouth daily.       Results for orders placed or performed during the hospital encounter of 02/05/17 (from the past 48 hour(s))  Lipase, blood     Status:  None   Collection Time: 02/05/17  8:39 PM  Result Value Ref Range   Lipase 16 11 - 51 U/L  Comprehensive metabolic panel     Status: Abnormal   Collection Time: 02/05/17  8:39 PM  Result Value Ref Range   Sodium 134 (L) 135 - 145 mmol/L   Potassium 4.7 3.5 - 5.1 mmol/L   Chloride 102 101 - 111 mmol/L   CO2 21 (L) 22 - 32 mmol/L   Glucose, Bld 231 (H) 65 - 99 mg/dL   BUN 62 (H) 6 - 20 mg/dL   Creatinine, Ser 3.31 (H) 0.44 - 1.00 mg/dL   Calcium 9.6 8.9 - 92.3 mg/dL   Total Protein 8.1 6.5 - 8.1 g/dL   Albumin 4.6 3.5 - 5.0 g/dL   AST 23 15 - 41 U/L   ALT 20 14 - 54 U/L   Alkaline Phosphatase 74 38 - 126 U/L   Total Bilirubin 1.1 0.3 - 1.2 mg/dL   GFR calc non Af Amer 28 (L) >60 mL/min   GFR calc Af Amer 33 (L) >60 mL/min    Comment: (NOTE) The eGFR has been calculated using the CKD EPI equation. This calculation has not been validated in all clinical situations. eGFR's persistently <60 mL/min signify possible Chronic Kidney Disease.    Anion gap 11 5 - 15  CBC     Status: Abnormal   Collection Time: 02/05/17  8:39 PM  Result Value Ref Range   WBC 8.1 4.0 - 10.5  K/uL   RBC 3.45 (L) 3.87 - 5.11 MIL/uL   Hemoglobin 10.5 (L) 12.0 - 15.0 g/dL   HCT 91.9 (L) 76.9 - 45.0 %   MCV 94.8 78.0 - 100.0 fL   MCH 30.4 26.0 - 34.0 pg   MCHC 32.1 30.0 - 36.0 g/dL   RDW 92.0 11.6 - 70.1 %   Platelets 273 150 - 400 K/uL  Urinalysis, Routine w reflex microscopic     Status: Abnormal   Collection Time: 02/05/17 11:52 PM  Result Value Ref Range   Color, Urine YELLOW YELLOW   APPearance HAZY (A) CLEAR   Specific Gravity, Urine 1.012 1.005 - 1.030   pH 5.0 5.0 - 8.0   Glucose, UA NEGATIVE NEGATIVE mg/dL   Hgb urine dipstick NEGATIVE NEGATIVE   Bilirubin Urine NEGATIVE NEGATIVE   Ketones, ur NEGATIVE NEGATIVE mg/dL   Protein, ur NEGATIVE NEGATIVE mg/dL   Nitrite NEGATIVE NEGATIVE   Leukocytes, UA NEGATIVE NEGATIVE  Glucose, capillary     Status: Abnormal   Collection Time: 02/06/17  3:26 AM  Result Value Ref Range   Glucose-Capillary 202 (H) 65 - 99 mg/dL  CBC     Status: Abnormal   Collection Time: 02/06/17  5:53 AM  Result Value Ref Range   WBC 7.0 4.0 - 10.5 K/uL   RBC 3.15 (L) 3.87 - 5.11 MIL/uL   Hemoglobin 9.7 (L) 12.0 - 15.0 g/dL   HCT 59.7 (L) 61.1 - 16.5 %   MCV 96.2 78.0 - 100.0 fL   MCH 30.8 26.0 - 34.0 pg   MCHC 32.0 30.0 - 36.0 g/dL   RDW 97.8 59.4 - 01.4 %   Platelets 245 150 - 400 K/uL  Differential     Status: None   Collection Time: 02/06/17  5:53 AM  Result Value Ref Range   Neutrophils Relative % 62 %   Neutro Abs 4.4 1.7 - 7.7 K/uL   Lymphocytes Relative 30 %   Lymphs Abs 2.1  0.7 - 4.0 K/uL   Monocytes Relative 6 %   Monocytes Absolute 0.4 0.1 - 1.0 K/uL   Eosinophils Relative 2 %   Eosinophils Absolute 0.1 0.0 - 0.7 K/uL   Basophils Relative 0 %   Basophils Absolute 0.0 0.0 - 0.1 K/uL   Ct Abdomen Pelvis Wo Contrast  Result Date: 02/06/2017 CLINICAL DATA:  Abdominal pain. Status post Whipple Jan 12, 2017. History of gastroparesis. EXAM: CT ABDOMEN AND PELVIS WITHOUT CONTRAST TECHNIQUE: Multidetector CT imaging of the  abdomen and pelvis was performed following the standard protocol without IV contrast. Oral contrast administered. COMPARISON:  Upper GI series February 03, 2017 and CT abdomen and pelvis October 23, 2016. FINDINGS: LOWER CHEST: LEFT lower lobe atelectasis/scar. Mild cardiomegaly. Severe coronary artery calcifications versus stent. No pericardial effusion. HEPATOBILIARY: Status post cholecystectomy. Normal noncontrast CT liver, no pneumobilia on today's examination. PANCREAS: Status post Whipple surgery, with pancreatic stent. No focal fluid collection. SPLEEN: Normal. ADRENALS/URINARY TRACT: Kidneys are orthotopic, demonstrating normal size and morphology. No nephrolithiasis, hydronephrosis; limited assessment for renal masses on this nonenhanced examination. 2 cm RIGHT upper pole cyst. The unopacified ureters are normal in course and caliber. Urinary bladder is partially distended and unremarkable. Normal adrenal glands. STOMACH/BOWEL: Retained barium in the gastric cardia results in streak artifact. Mild contrast distended stomach. Asymmetric gastric wall thickening at the level of surgical anastomosis. No contrast extravasation. Small amount of enteric contrast within the jejunum. Retained contrast in the colon which is normal in course and caliber. Mild sigmoid diverticulosis. Distal duodenum is not identified, possibly decompressed. VASCULAR/LYMPHATIC: Aortoiliac vessels are normal in course and caliber. No lymphadenopathy by CT size criteria. REPRODUCTIVE: Status post hysterectomy . OTHER: No intraperitoneal free fluid or free air. MUSCULOSKELETAL: Non-acute. Small fat containing RIGHT inguinal hernia. Status post L4-5 interbody fusion and laminectomy. Severe L3-4 degenerative disc and grade 1 L3-4 anterolisthesis with severe facet arthropathy consistent with adjacent segment disease. Severe RIGHT L3-4 neural foraminal narrowing. IMPRESSION: Status post Whipple procedure. Nonspecific eccentric gastric wall thickening  at anastomosis,, likely inflammatory given recent surgery . Recommend close attention on follow-up imaging versus endoscopy. Gastric distention retained barium in stomach from 3 days prior consistent with gastroparesis. Pancreatic stent.  No biliary dilatation by noncontrast CT. Electronically Signed   By: Elon Alas M.D.   On: 02/06/2017 00:58    Review of Systems  Constitutional: Negative for chills, fever and weight loss.  HENT: Negative for nosebleeds.   Eyes: Negative for blurred vision.  Respiratory: Negative for shortness of breath.   Cardiovascular: Negative for chest pain, palpitations, orthopnea and PND.       Denies DOE  Gastrointestinal: Positive for nausea and vomiting.  Genitourinary: Negative for dysuria and hematuria.  Musculoskeletal: Negative.        Calf pain  Skin: Negative for itching and rash.  Neurological: Negative for dizziness, focal weakness, seizures, loss of consciousness and headaches.       Denies TIAs, amaurosis fugax  Endo/Heme/Allergies: Does not bruise/bleed easily.  Psychiatric/Behavioral: The patient is not nervous/anxious.   All other systems reviewed and are negative.   Blood pressure (!) 168/70, pulse 74, temperature 98.7 F (37.1 C), temperature source Oral, resp. rate 19, height '5\' 4"'$  (1.626 m), weight 89.9 kg (198 lb 3.1 oz), SpO2 100 %. Physical Exam  Vitals reviewed. Constitutional: She is oriented to person, place, and time. She appears well-developed and well-nourished. No distress.  HENT:  Head: Normocephalic and atraumatic.  Right Ear: External ear normal.  Left  Ear: External ear normal.  Eyes: Conjunctivae are normal. No scleral icterus.  Neck: Normal range of motion. Neck supple. No tracheal deviation present. No thyromegaly present.  Cardiovascular: Normal rate, normal heart sounds and intact distal pulses.   Respiratory: Effort normal and breath sounds normal. No stridor. No respiratory distress. She has no wheezes.  GI:  Soft. She exhibits no distension. There is no rebound and no guarding.  Musculoskeletal: She exhibits no edema or tenderness.  Mild b/l calf pain  Neurological: She is alert and oriented to person, place, and time. She exhibits normal muscle tone.  Skin: Skin is warm and dry. No rash noted. She is not diaphoretic. No erythema. No pallor.  Psychiatric: She has a normal mood and affect. Her behavior is normal. Judgment and thought content normal.     Assessment/Plan pT3N2 adenocarcinoma of the ampulla, s/p whipple Delayed gastric emptying due to gastroparesis Intolerance of oral diet, on TNA Left popliteal DVT Diabetes with complication of gastroparesis Moderate Protein calorie malnutrition Acute kidney injury Hypertension  Readmit patient less than 24 hours from prior discharge. Resume TPN Continue therapeutic Lovenox dosage for DVT Repeat labs in the morning to monitor creatinine, if creatinine still going up may need to switch anticoagulation Water and ice chips as tolerated Ambulate Home medications  Leighton Ruff. Redmond Pulling, MD, [place], Bariatric, [facility] Minimally Invasive Surgery [facility], [place]    Gayland Curry, MD 02/06/2017, 6:52 AM

## 2017-02-06 NOTE — ED Provider Notes (Signed)
Xray concerning for gastroparesis, as well as inflammation/thickening at anastomosis. Currently not vomiting. D/w Dr. Redmond Pulling of surgery, will admit for obs/further treatment   Kaylee Gambler, MD 02/06/17 1001

## 2017-02-07 DIAGNOSIS — E440 Moderate protein-calorie malnutrition: Secondary | ICD-10-CM | POA: Insufficient documentation

## 2017-02-07 LAB — GLUCOSE, CAPILLARY
Glucose-Capillary: 108 mg/dL — ABNORMAL HIGH (ref 65–99)
Glucose-Capillary: 140 mg/dL — ABNORMAL HIGH (ref 65–99)
Glucose-Capillary: 162 mg/dL — ABNORMAL HIGH (ref 65–99)
Glucose-Capillary: 170 mg/dL — ABNORMAL HIGH (ref 65–99)
Glucose-Capillary: 173 mg/dL — ABNORMAL HIGH (ref 65–99)
Glucose-Capillary: 187 mg/dL — ABNORMAL HIGH (ref 65–99)

## 2017-02-07 LAB — BASIC METABOLIC PANEL
Anion gap: 4 — ABNORMAL LOW (ref 5–15)
BUN: 38 mg/dL — ABNORMAL HIGH (ref 6–20)
CO2: 19 mmol/L — ABNORMAL LOW (ref 22–32)
Calcium: 7.7 mg/dL — ABNORMAL LOW (ref 8.9–10.3)
Chloride: 117 mmol/L — ABNORMAL HIGH (ref 101–111)
Creatinine, Ser: 0.9 mg/dL (ref 0.44–1.00)
GFR calc Af Amer: >60 mL/min (ref >60)
GFR calc non Af Amer: 60 mL/min — ABNORMAL LOW (ref >60)
Glucose, Bld: 166 mg/dL — ABNORMAL HIGH (ref 65–99)
Potassium: 3.9 mmol/L (ref 3.5–5.1)
Sodium: 140 mmol/L (ref 135–145)

## 2017-02-07 MED ORDER — FAT EMULSION 20 % IV EMUL
240.0000 mL | INTRAVENOUS | Status: AC
  Administered 2017-02-07: 240 mL via INTRAVENOUS
  Filled 2017-02-07: qty 250

## 2017-02-07 MED ORDER — M.V.I. ADULT IV INJ
INTRAVENOUS | Status: AC
  Administered 2017-02-07: 18:00:00 via INTRAVENOUS
  Filled 2017-02-07: qty 1992

## 2017-02-07 MED ORDER — ERYTHROMYCIN ETHYLSUCCINATE 400 MG/5ML PO SUSR
400.0000 mg | Freq: Four times a day (QID) | ORAL | Status: DC
  Administered 2017-02-08 (×5): 400 mg via ORAL
  Filled 2017-02-07 (×6): qty 5

## 2017-02-07 NOTE — Progress Notes (Signed)
PHARMACY - ADULT TOTAL PARENTERAL NUTRITION CONSULT NOTE   Pharmacy Consult:  TPN Indication: Ileus  Patient Measurements: Height: 5\' 4"  (162.6 cm) Weight: 198 lb 3.1 oz (89.9 kg) IBW/kg (Calculated) : 54.7 TPN AdjBW (KG): 63.5 Body mass index is 34.02 kg/m.  Assessment:  28 YOF with severe malnutrition related to renal cell and pancreatic cancer who presented with abdominal mass on 01/12/17.  She is s/p Whipple procedure on the same day.  She was discharged 6/22 and readmitted same day for vomiting at SNF. Pharmacy consulted to manage TPN for ileus that prevented oral intake and caused significant nausea and vomiting.   GI: prealbumin improved to 23.8 (WNL).  Last BM 6/21.  Erythromycin for gastroparesis/delayed emptying, PRN Zofran/Phenergan (last QTc 419mc), PPI PO.  Plan d/c to SNF on TPN. Endo: hypothyroid on Synthroid.  DM not on med PTA - CBGs 129-193 Insulin requirements in the past 24 hours: 23 units SSI + 40 units in TPN Lytes: all WNL Renal: SCr down to 0.9 (BL SCr ~ 0.8), BUN 62 - NS 20K at 50 ml/hr, no I/O's Pulm: RA Cards: HTN - BP wnl-elevated - amlodipine, irbesartan, metoprolol ER AC: Lovenox for new DVT - mild anemia, plts WNL Hepatobil: LFTs, Tbili, TG WNL Neuro: hx CVA 2016 with residual L-hand and foot weakness ID: afebrile, WBC WNL  Best Practices: Lovenox  TPN Access: triple lumen PICC placed 01/25/17 - replaced 6/19 TPN start date: 01/23/17  Nutritional Goals (per RD recommendation on 6/23): 1800-1950 kCal per day 82-93 g protein per day  Current Nutrition:  TPN  NPO except sips with medications  Plan:   - Continue Clinimix E 5/15 at 83 ml/hr and 20% ILE at 20 ml/hr for 12 hrs - TPN provides 1894 kCal and 100 gm of protein per day, meeting 100% of total needs. - Daily multivitamin in TPN - Trace elements in TPN every other day d/t shortage, due 6/25 - Continue resistant SSI q4h + increase to 50 units insulin in TPN, monitor CBGs - IVF decreased to 50  ml/hr per MD - TPN labs per protocol   Thank you for allowing pharmacy to be part of this patients care team.  Renold Genta, PharmD, BCPS Clinical Pharmacist Phone for today - [place] - [phone] 02/07/2017 7:53 AM

## 2017-02-07 NOTE — Progress Notes (Signed)
Progress Note: General Surgery Service   Assessment/Plan: Patient Active Problem List   Diagnosis Date Noted  . Malnutrition of moderate degree 02/07/2017  . Gastroparesis 02/06/2017  . Adenocarcinoma ([place]) 01/12/2017  . 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 Essential hypertension 02/11/2016  . Lumbar stenosis with neurogenic claudication 02/10/2016  . Hyperlipidemia 02/10/2016  . Hypothyroidism 02/10/2016  . Musculoskeletal neck pain 12/13/2014  . Muscular pain 12/13/2014  . Numbness   . Acute CVA (cerebrovascular accident) ([place]) 09/16/2014  . Right renal mass 09/13/2014  . Cerebral infarction due to unspecified mechanism   . CVA (cerebral infarction) 09/11/2014  . Osteoarthritis 09/11/2014  . Postsurgical hypothyroidism 09/11/2014  . Uncontrolled hypertension 09/11/2014  . History of CVA (cerebral vascular accident) ([place]) 09/11/2014  . Diabetes mellitus without complication ([place]) 34/19/6222  . Dyslipidemia 04/20/2007   s/p  Whipple with complication of gastroparesis and persistent n/v -clear liquids as tol today -continue TPN -up to chair and ambulate   LOS: 1 day  Chief Complaint/Subjective: Loose BM overnight, no vomiting, still some bloating  Objective: Vital signs in last 24 hours: Temp:  [98.4 F (36.9 C)-98.8 F (37.1 C)] 98.5 F (36.9 C) (06/24 0531) Pulse Rate:  [62-64] 62 (06/24 0531) Resp:  [17-19] 17 (06/24 0531) BP: (128-146)/(52-62) 146/56 (06/24 0531) SpO2:  [96 %-100 %] 96 % (06/24 0531) Last BM Date: 02/07/17  Intake/Output from previous day: 06/23 0701 - 06/24 0700 In: 1489.1 [P.O.:30; I.V.:1459.1] Out: 1350 [Urine:1350] Intake/Output this shift: No intake/output data recorded.  Lungs: CTAB  Cardiovascular: RRR  Abd: soft, NT, ND, incision c/d/i  Extremities: no edema  Neuro: AOx4  Lab Results: CBC   Recent Labs  02/05/17 2039 02/06/17 0553  WBC 8.1 7.0  HGB 10.5* 9.7*  HCT 32.7* 30.3*  PLT 273 245   BMET  Recent  Labs  02/06/17 0553 02/07/17 0445  NA 136 140  K 4.7 3.9  CL 104 117*  CO2 20* 19*  GLUCOSE 144* 166*  BUN 62* 38*  CREATININE 1.62* 0.90  CALCIUM 9.1 7.7*   PT/INR No results for input(s): LABPROT, INR in the last 72 hours. ABG No results for input(s): PHART, HCO3 in the last 72 hours.  Invalid input(s): PCO2, PO2  Studies/Results:  Anti-infectives: Anti-infectives    Start     Dose/Rate Route Frequency Ordered Stop   02/06/17 0600  erythromycin ethylsuccinate (EES) 200 MG/5ML suspension 400 mg     400 mg Oral Every 6 hours 02/06/17 0324        Medications: Scheduled Meds: . amLODipine  10 mg Oral Daily  . enoxaparin  90 mg Subcutaneous Q12H  . erythromycin ethylsuccinate  400 mg Oral Q6H  . insulin aspart  0-20 Units Subcutaneous Q4H  . irbesartan  300 mg Oral Daily  . levothyroxine  200 mcg Oral QAC breakfast  . metoprolol succinate  100 mg Oral Daily  . pantoprazole  40 mg Oral QHS   Continuous Infusions: . 0.9 % NaCl with KCl 20 mEq / L 50 mL/hr at 02/07/17 0839  . Marland KitchenTPN (CLINIMIX-E) Adult     And  . fat emulsion    . Marland KitchenTPN (CLINIMIX-E) Adult 83 mL/hr at 02/06/17 1712   PRN Meds:.acetaminophen **OR** acetaminophen, albuterol, HYDROmorphone (DILAUDID) injection, NIFEdipine, ondansetron **OR** ondansetron (ZOFRAN) IV, oxyCODONE, promethazine, sodium chloride flush  Mickeal Skinner, MD Pg# [phone] [facility]

## 2017-02-08 LAB — CBC
HCT: 29.4 % — ABNORMAL LOW (ref 36.0–46.0)
Hemoglobin: 9.1 g/dL — ABNORMAL LOW (ref 12.0–15.0)
MCH: 30.4 pg (ref 26.0–34.0)
MCHC: 31 g/dL (ref 30.0–36.0)
MCV: 98.3 fL (ref 78.0–100.0)
Platelets: 193 10*3/uL (ref 150–400)
RBC: 2.99 MIL/uL — ABNORMAL LOW (ref 3.87–5.11)
RDW: 14.3 % (ref 11.5–15.5)
WBC: 6.5 10*3/uL (ref 4.0–10.5)

## 2017-02-08 LAB — COMPREHENSIVE METABOLIC PANEL
ALT: 18 U/L (ref 14–54)
AST: 20 U/L (ref 15–41)
Albumin: 3.5 g/dL (ref 3.5–5.0)
Alkaline Phosphatase: 59 U/L (ref 38–126)
Anion gap: 5 (ref 5–15)
BUN: 32 mg/dL — ABNORMAL HIGH (ref 6–20)
CO2: 23 mmol/L (ref 22–32)
Calcium: 8.8 mg/dL — ABNORMAL LOW (ref 8.9–10.3)
Chloride: 112 mmol/L — ABNORMAL HIGH (ref 101–111)
Creatinine, Ser: 0.93 mg/dL (ref 0.44–1.00)
GFR calc Af Amer: >60 mL/min (ref >60)
GFR calc non Af Amer: 57 mL/min — ABNORMAL LOW (ref >60)
Glucose, Bld: 107 mg/dL — ABNORMAL HIGH (ref 65–99)
Potassium: 4.3 mmol/L (ref 3.5–5.1)
Sodium: 140 mmol/L (ref 135–145)
Total Bilirubin: 0.5 mg/dL (ref 0.3–1.2)
Total Protein: 6.7 g/dL (ref 6.5–8.1)

## 2017-02-08 LAB — DIFFERENTIAL
Basophils Absolute: 0 10*3/uL (ref 0.0–0.1)
Basophils Relative: 0 %
Eosinophils Absolute: 0.2 10*3/uL (ref 0.0–0.7)
Eosinophils Relative: 3 %
Lymphocytes Relative: 41 %
Lymphs Abs: 2.7 10*3/uL (ref 0.7–4.0)
Monocytes Absolute: 0.5 10*3/uL (ref 0.1–1.0)
Monocytes Relative: 8 %
Neutro Abs: 3.1 10*3/uL (ref 1.7–7.7)
Neutrophils Relative %: 48 %

## 2017-02-08 LAB — GLUCOSE, CAPILLARY
Glucose-Capillary: 104 mg/dL — ABNORMAL HIGH (ref 65–99)
Glucose-Capillary: 108 mg/dL — ABNORMAL HIGH (ref 65–99)
Glucose-Capillary: 120 mg/dL — ABNORMAL HIGH (ref 65–99)
Glucose-Capillary: 134 mg/dL — ABNORMAL HIGH (ref 65–99)
Glucose-Capillary: 139 mg/dL — ABNORMAL HIGH (ref 65–99)
Glucose-Capillary: 174 mg/dL — ABNORMAL HIGH (ref 65–99)
Glucose-Capillary: 190 mg/dL — ABNORMAL HIGH (ref 65–99)

## 2017-02-08 LAB — PHOSPHORUS: Phosphorus: 3.7 mg/dL (ref 2.5–4.6)

## 2017-02-08 LAB — MAGNESIUM: Magnesium: 2 mg/dL (ref 1.7–2.4)

## 2017-02-08 LAB — PREALBUMIN: Prealbumin: 19.3 mg/dL (ref 18–38)

## 2017-02-08 LAB — TRIGLYCERIDES: Triglycerides: 78 mg/dL (ref <150)

## 2017-02-08 MED ORDER — FAT EMULSION 20 % IV EMUL
240.0000 mL | INTRAVENOUS | Status: AC
  Administered 2017-02-08: 240 mL via INTRAVENOUS
  Filled 2017-02-08: qty 250

## 2017-02-08 MED ORDER — TRACE MINERALS CR-CU-MN-SE-ZN 10-1000-500-60 MCG/ML IV SOLN
INTRAVENOUS | Status: AC
  Administered 2017-02-08: 18:00:00 via INTRAVENOUS
  Filled 2017-02-08: qty 1992

## 2017-02-08 NOTE — Progress Notes (Signed)
Patient ID: Kaylee Mullins, female   DOB: 1936-05-20, 81 y.o.   MRN: [id number]  Progress Note: General Surgery Service   Assessment/Plan: Patient Active Problem List   Diagnosis Date Noted  . Malnutrition of moderate degree 02/07/2017  . Gastroparesis 02/06/2017  . Adenocarcinoma ([facility]) 01/12/2017  . 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 Essential hypertension 02/11/2016  . Lumbar stenosis with neurogenic claudication 02/10/2016  . Hyperlipidemia 02/10/2016  . Hypothyroidism 02/10/2016  . Musculoskeletal neck pain 12/13/2014  . Muscular pain 12/13/2014  . Numbness   . Acute CVA (cerebrovascular accident) ([place]) 09/16/2014  . Right renal mass 09/13/2014  . Cerebral infarction due to unspecified mechanism   . CVA (cerebral infarction) 09/11/2014  . Osteoarthritis 09/11/2014  . Postsurgical hypothyroidism 09/11/2014  . Uncontrolled hypertension 09/11/2014  . History of CVA (cerebral vascular accident) ([place]) 09/11/2014  . Diabetes mellitus without complication ([place]) 25/42/7062  . Dyslipidemia 04/20/2007   s/p  Whipple with complication of gastroparesis and persistent n/v -SIPS -continue TPN -up to chair and ambulate Social work.   LOS: 2 days  Chief Complaint/Subjective: No n/v.  Persistent low grade pain on left abdomen.    Objective: Vital signs in last 24 hours: Temp:  [98.8 F (37.1 C)-98.9 F (37.2 C)] 98.8 F (37.1 C) (06/25 0645) Pulse Rate:  [65-66] 65 (06/25 0645) Resp:  [17-18] 17 (06/25 0645) BP: (130-131)/(58-59) 131/59 (06/25 0645) SpO2:  [95 %-100 %] 95 % (06/25 0645) Last BM Date: 02/07/17  Intake/Output from previous day: 06/24 0701 - 06/25 0700 In: 5529.3 [P.O.:582; I.V.:4947.3] Out: 750 [Urine:750] Intake/Output this shift: No intake/output data recorded.  Lungs: breathing comfortably  Abd: soft, NT, ND, incision c/d/i  Extremities: no edema  Neuro: AOx4  Lab Results: CBC   Recent Labs  02/06/17 0553 02/08/17 0427  WBC 7.0 6.5   HGB 9.7* 9.1*  HCT 30.3* 29.4*  PLT 245 193   BMET  Recent Labs  02/07/17 0445 02/08/17 0427  NA 140 140  K 3.9 4.3  CL 117* 112*  CO2 19* 23  GLUCOSE 166* 107*  BUN 38* 32*  CREATININE 0.90 0.93  CALCIUM 7.7* 8.8*   PT/INR No results for input(s): LABPROT, INR in the last 72 hours. ABG No results for input(s): PHART, HCO3 in the last 72 hours.  Invalid input(s): PCO2, PO2  Studies/Results:  Anti-infectives: Anti-infectives    Start     Dose/Rate Route Frequency Ordered Stop   02/07/17 0000  erythromycin (EES) 400 MG/5ML suspension 400 mg     400 mg Oral Every 6 hours 02/07/17 1932     02/06/17 0600  erythromycin ethylsuccinate (EES) 200 MG/5ML suspension 400 mg  Status:  Discontinued     400 mg Oral Every 6 hours 02/06/17 0324 02/07/17 1932      Medications: Scheduled Meds: . amLODipine  10 mg Oral Daily  . enoxaparin  90 mg Subcutaneous Q12H  . erythromycin  400 mg Oral Q6H  . insulin aspart  0-20 Units Subcutaneous Q4H  . irbesartan  300 mg Oral Daily  . levothyroxine  200 mcg Oral QAC breakfast  . metoprolol succinate  100 mg Oral Daily  . pantoprazole  40 mg Oral QHS   Continuous Infusions: . 0.9 % NaCl with KCl 20 mEq / L 50 mL/hr at 02/07/17 0839  . Marland KitchenTPN (CLINIMIX-E) Adult     And  . fat emulsion    . Marland KitchenTPN (CLINIMIX-E) Adult 83 mL/hr at 02/07/17 1751   PRN Meds:.acetaminophen **OR** acetaminophen, albuterol,  HYDROmorphone (DILAUDID) injection, NIFEdipine, ondansetron **OR** ondansetron (ZOFRAN) IV, oxyCODONE, promethazine, sodium chloride flush  Freedom Lopezperez, MD  [facility]

## 2017-02-08 NOTE — Progress Notes (Signed)
PHARMACY - ADULT TOTAL PARENTERAL NUTRITION CONSULT NOTE   Pharmacy Consult:  TPN Indication: Ileus  Patient Measurements: Height: 5\' 4"  (162.6 cm) Weight: 198 lb 3.1 oz (89.9 kg) IBW/kg (Calculated) : 54.7 TPN AdjBW (KG): 63.5 Body mass index is 34.02 kg/m.  Assessment:  42 YOF with severe malnutrition related to renal cell and pancreatic cancer who presented with abdominal mass on 01/12/17.  She is s/p Whipple procedure on the same day.  She was discharged 02/05/17 and readmitted same day for vomiting at SNF. Pharmacy consulted to manage TPN for ileus that prevented oral intake and caused significant nausea and vomiting.   GI: prealbumin WNL at 19.3.  Erythromycin for gastroparesis/delayed emptying, LBM 6/24, PRN Zofran/Phenergan (last QTc 448mc), PPI PO.  Endo: hypothyroid on Synthroid.  DM not on med PTA - CBGs controlled Insulin requirements in the past 24 hours: 19 units SSI + 50 units in TPN Lytes: CL slightly elevated, others WNL Renal: SCr 0.93 (BL SCr ~ 0.8), BUN down to 32 - NS 20K at 50 ml/hr, UOP 0.3 ml/kg/hr Pulm: stable on RA Cards: HTN - VSS - amlodipine, irbesartan, Toprol AC: Lovenox for new DVT - mild anemia, plts WNL, dose appropriate Hepatobil: LFTs / tbili / TG WNL Neuro: hx CVA 2016 with residual L-hand and foot weakness ID: afebrile, WBC WNL - not on abx Best Practices: Lovenox  TPN Access: triple lumen PICC placed 6/11 - replaced 02/02/17 TPN start date: 01/23/17  Nutritional Goals (per RD recommendation on 6/23): 1800-1950 kCal, 82-93 g protein per day  Current Nutrition:  TPN  Clear liquid diet (eating ~30% of meals, no appetite, abd pain)   Plan:   - Continue Clinimix E 5/15 at 83 ml/hr and 20% ILE at 20 ml/hr for 12 hrs.  TPN provides 1894 kCal and 100 gm of protein per day, meeting 100% of total needs. - Daily multivitamin in TPN - Trace elements in TPN every other day d/t shortage, due 6/25 - Continue resistant SSI Q4H + 50 units insulin in TPN.   F/U CBGs and may need to reduce insulin. - NS 20K at 50 ml/hr per MD - F/U with ability to start enteral nutrition to supplement PO intake.  If not, plan to cycle TPN.   Shakeira Rhee D. Mina Marble, PharmD, BCPS Pager:  [phone] 02/08/2017, 11:33 AM

## 2017-02-09 LAB — GLUCOSE, CAPILLARY
Glucose-Capillary: 164 mg/dL — ABNORMAL HIGH (ref 65–99)
Glucose-Capillary: 149 mg/dL — ABNORMAL HIGH (ref 65–99)
Glucose-Capillary: 133 mg/dL — ABNORMAL HIGH (ref 65–99)
Glucose-Capillary: 150 mg/dL — ABNORMAL HIGH (ref 65–99)
Glucose-Capillary: 148 mg/dL — ABNORMAL HIGH (ref 65–99)

## 2017-02-09 MED ORDER — FAT EMULSION 20 % IV EMUL
240.0000 mL | INTRAVENOUS | Status: AC
  Administered 2017-02-09: 240 mL via INTRAVENOUS
  Filled 2017-02-09: qty 250

## 2017-02-09 MED ORDER — ERYTHROMYCIN ETHYLSUCCINATE 400 MG/5ML PO SUSR
200.0000 mg | Freq: Four times a day (QID) | ORAL | Status: DC
  Administered 2017-02-09 – 2017-03-18 (×92): 200 mg via ORAL
  Filled 2017-02-09 (×169): qty 2.5

## 2017-02-09 MED ORDER — M.V.I. ADULT IV INJ
INTRAVENOUS | Status: AC
  Administered 2017-02-09: 18:00:00 via INTRAVENOUS
  Filled 2017-02-09: qty 1990

## 2017-02-09 NOTE — Progress Notes (Signed)
PHARMACY - ADULT TOTAL PARENTERAL NUTRITION CONSULT NOTE   Pharmacy Consult:  TPN Indication: Ileus  Patient Measurements: Height: 5\' 4"  (162.6 cm) Weight: 198 lb 3.1 oz (89.9 kg) IBW/kg (Calculated) : 54.7 TPN AdjBW (KG): 63.5 Body mass index is 34.02 kg/m.  Assessment:  87 YOF with severe malnutrition related to renal cell and pancreatic cancer who presented with abdominal mass on 01/12/17.  She is s/p Whipple procedure on the same day.  She was discharged 02/05/17 and readmitted same day for vomiting at SNF. Pharmacy consulted to manage TPN for ileus that prevented oral intake and caused significant nausea and vomiting.   GI: prealbumin WNL at 19.3.  Erythromycin for gastroparesis/delayed emptying, LBM 6/24, emesis x3 - PRN Zofran/Phenergan (QTc 490ms on 6/17), PPI PO.  Endo: hypothyroid on Synthroid.  DM not on med PTA - CBGs mostly controlled Insulin requirements in the past 24 hours: 14 units SSI + 50 units in TPN Lytes: 6/25 labs - CL slightly elevated, others WNL Renal: SCr 0.93 (BL SCr ~ 0.8), BUN down to 32 - NS 20K at 50 ml/hr, UOP 0.5 ml/kg/hr Pulm: stable on RA Cards: HTN - VSS - amlodipine, irbesartan, Toprol AC: Lovenox for new DVT - dose appropriate, mild anemia, plts WNL Hepatobil: LFTs / tbili / TG WNL Neuro: hx CVA 2016 with residual L-hand and foot weakness ID: afebrile, WBC WNL - not on abx Best Practices: Lovenox  TPN Access: triple lumen PICC placed 6/11 - replaced 02/02/17 TPN start date: 01/23/17  Nutritional Goals (per RD recommendation on 6/23): 1800-1950 kCal, 82-93 g protein per day  Current Nutrition:  TPN  Clear liquid diet (ate 15% of meals, feels full and food doesn't digest)   Plan:   - Attempt cyclic Clinimix E 6/75, infuse 1990 mls over 20 hours:  50 ml/hr x 1 hr, then 105 ml/hr x 18 hrs, then 50 ml/hr x 1 hr. - 20% ILE at 20 ml/hr x 12 hrs - TPN provides 1893 kCal and 100 gm of protein per day, meeting 100% of total needs - Daily multivitamin  in TPN - Trace elements in TPN every other day d/t shortage, next due 6/27 - Continue resistant SSI Q4H + 50 units insulin in TPN.  Watch CBGs closely as patient may not tolerate cyclic TPN. - NS 44B at 50 ml/hr per MD - F/U PO intake   Calynn Ferrero D. Mina Marble, PharmD, BCPS Pager:  [phone] 02/09/2017, 9:01 AM

## 2017-02-09 NOTE — Progress Notes (Signed)
Patient ID: Kaylee Mullins, female   DOB: May 08, 1936, 81 y.o.   MRN: [id number]  Progress Note: General Surgery Service   Assessment/Plan: Patient Active Problem List   Diagnosis Date Noted  . Malnutrition of moderate degree 02/07/2017  . Gastroparesis 02/06/2017  . Adenocarcinoma ([place]) 01/12/2017  . 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 Essential hypertension 02/11/2016  . Lumbar stenosis with neurogenic claudication 02/10/2016  . Hyperlipidemia 02/10/2016  . Hypothyroidism 02/10/2016  . Musculoskeletal neck pain 12/13/2014  . Muscular pain 12/13/2014  . Numbness   . Acute CVA (cerebrovascular accident) ([place]) 09/16/2014  . Right renal mass 09/13/2014  . Cerebral infarction due to unspecified mechanism   . CVA (cerebral infarction) 09/11/2014  . Osteoarthritis 09/11/2014  . Postsurgical hypothyroidism 09/11/2014  . Uncontrolled hypertension 09/11/2014  . History of CVA (cerebral vascular accident) ([place]) 09/11/2014  . Diabetes mellitus without complication ([place]) 17/91/5056  . Dyslipidemia 04/20/2007   s/p  Whipple with complication of gastroparesis and persistent n/v -SIPS -continue TPN -up to chair and ambulate Social work consult   LOS: 3 days  Chief Complaint/Subjective: 3 x emesis yesterday.  Feels a little bloated still.    Objective: Vital signs in last 24 hours: Temp:  [98.3 F (36.8 C)-98.8 F (37.1 C)] 98.3 F (36.8 C) (06/25 2006) Pulse Rate:  [60-65] 64 (06/25 2006) Resp:  [17-18] 17 (06/25 2006) BP: (131-138)/(59-69) 132/66 (06/25 2006) SpO2:  [95 %-100 %] 98 % (06/25 2006) Last BM Date: 02/06/17  Intake/Output from previous day: 06/25 0701 - 06/26 0700 In: 1090 [P.O.:240; I.V.:850] Out: 200 [Urine:200] Intake/Output this shift: Total I/O In: 0  Out: 200 [Urine:200]  Lungs: breathing comfortably  Abd: soft, NT, slightly bloated compared to previous, incision c/d/i  Extremities: no edema  Neuro: AOx4  Lab Results: CBC   Recent Labs  02/06/17 0553 02/08/17 0427  WBC 7.0 6.5  HGB 9.7* 9.1*  HCT 30.3* 29.4*  PLT 245 193   BMET  Recent Labs  02/07/17 0445 02/08/17 0427  NA 140 140  K 3.9 4.3  CL 117* 112*  CO2 19* 23  GLUCOSE 166* 107*  BUN 38* 32*  CREATININE 0.90 0.93  CALCIUM 7.7* 8.8*   PT/INR No results for input(s): LABPROT, INR in the last 72 hours. ABG No results for input(s): PHART, HCO3 in the last 72 hours.  Invalid input(s): PCO2, PO2  Studies/Results:  Anti-infectives: Anti-infectives    Start     Dose/Rate Route Frequency Ordered Stop   02/07/17 0000  erythromycin (EES) 400 MG/5ML suspension 400 mg     400 mg Oral Every 6 hours 02/07/17 1932     02/06/17 0600  erythromycin ethylsuccinate (EES) 200 MG/5ML suspension 400 mg  Status:  Discontinued     400 mg Oral Every 6 hours 02/06/17 0324 02/07/17 1932      Medications: Scheduled Meds: . amLODipine  10 mg Oral Daily  . enoxaparin  90 mg Subcutaneous Q12H  . erythromycin  400 mg Oral Q6H  . insulin aspart  0-20 Units Subcutaneous Q4H  . irbesartan  300 mg Oral Daily  . levothyroxine  200 mcg Oral QAC breakfast  . metoprolol succinate  100 mg Oral Daily  . pantoprazole  40 mg Oral QHS   Continuous Infusions: . 0.9 % NaCl with KCl 20 mEq / L 50 mL/hr at 02/08/17 2009  . Marland KitchenTPN (CLINIMIX-E) Adult 83 mL/hr at 02/08/17 1748   And  . fat emulsion 240 mL (02/08/17 1748)   PRN Meds:.acetaminophen **  OR** acetaminophen, albuterol, HYDROmorphone (DILAUDID) injection, NIFEdipine, ondansetron **OR** ondansetron (ZOFRAN) IV, oxyCODONE, promethazine, sodium chloride flush  Kaylee Keelin, MD  [facility]

## 2017-02-10 ENCOUNTER — Encounter (HOSPITAL_COMMUNITY)
Admission: EM | Disposition: A | Discharge status: Skilled Nursing Facility | Payer: Self-pay | Source: Home / Self Care | Attending: General Surgery

## 2017-02-10 ENCOUNTER — Inpatient Hospital Stay (HOSPITAL_COMMUNITY): Payer: Medicare Other | Admitting: Anesthesiology

## 2017-02-10 ENCOUNTER — Encounter (HOSPITAL_COMMUNITY): Payer: Self-pay | Admitting: *Deleted

## 2017-02-10 ENCOUNTER — Inpatient Hospital Stay (HOSPITAL_COMMUNITY): Payer: Medicare Other

## 2017-02-10 HISTORY — PX: ESOPHAGOGASTRODUODENOSCOPY: SHX5428

## 2017-02-10 LAB — BASIC METABOLIC PANEL
Anion gap: 4 — ABNORMAL LOW (ref 5–15)
BUN: 25 mg/dL — ABNORMAL HIGH (ref 6–20)
CO2: 24 mmol/L (ref 22–32)
Calcium: 9.2 mg/dL (ref 8.9–10.3)
Chloride: 111 mmol/L (ref 101–111)
Creatinine, Ser: 0.96 mg/dL (ref 0.44–1.00)
GFR calc Af Amer: >60 mL/min (ref >60)
GFR calc non Af Amer: 55 mL/min — ABNORMAL LOW (ref >60)
Glucose, Bld: 96 mg/dL (ref 65–99)
Potassium: 4 mmol/L (ref 3.5–5.1)
Sodium: 139 mmol/L (ref 135–145)

## 2017-02-10 LAB — GLUCOSE, CAPILLARY
Glucose-Capillary: 128 mg/dL — ABNORMAL HIGH (ref 65–99)
Glucose-Capillary: 141 mg/dL — ABNORMAL HIGH (ref 65–99)
Glucose-Capillary: 166 mg/dL — ABNORMAL HIGH (ref 65–99)
Glucose-Capillary: 166 mg/dL — ABNORMAL HIGH (ref 65–99)
Glucose-Capillary: 173 mg/dL — ABNORMAL HIGH (ref 65–99)
Glucose-Capillary: 177 mg/dL — ABNORMAL HIGH (ref 65–99)

## 2017-02-10 SURGERY — EGD (ESOPHAGOGASTRODUODENOSCOPY)
Anesthesia: Monitor Anesthesia Care

## 2017-02-10 MED ORDER — FAT EMULSION 20 % IV EMUL
240.0000 mL | INTRAVENOUS | Status: AC
  Administered 2017-02-10: 240 mL via INTRAVENOUS
  Filled 2017-02-10: qty 250

## 2017-02-10 MED ORDER — LACTATED RINGERS IV SOLN
INTRAVENOUS | Status: DC | PRN
  Administered 2017-02-10: 12:00:00 via INTRAVENOUS

## 2017-02-10 MED ORDER — ONDANSETRON HCL 4 MG/2ML IJ SOLN
INTRAMUSCULAR | Status: DC | PRN
  Administered 2017-02-10: 4 mg via INTRAVENOUS

## 2017-02-10 MED ORDER — PHENOL 1.4 % MT LIQD
1.0000 | OROMUCOSAL | Status: DC | PRN
  Filled 2017-02-10 (×3): qty 177

## 2017-02-10 MED ORDER — NYSTATIN 100000 UNIT/ML MT SUSP
5.0000 mL | Freq: Four times a day (QID) | OROMUCOSAL | Status: DC
  Administered 2017-02-10 – 2017-03-15 (×74): 500000 [IU] via ORAL
  Filled 2017-02-10 (×72): qty 5

## 2017-02-10 MED ORDER — WHITE PETROLATUM GEL
Status: AC
  Administered 2017-02-10: 16:00:00
  Filled 2017-02-10: qty 1

## 2017-02-10 MED ORDER — PANTOPRAZOLE SODIUM 40 MG IV SOLR
40.0000 mg | INTRAVENOUS | Status: DC
  Administered 2017-02-10 – 2017-03-28 (×45): 40 mg via INTRAVENOUS
  Filled 2017-02-10 (×47): qty 40

## 2017-02-10 MED ORDER — PROPOFOL 500 MG/50ML IV EMUL
INTRAVENOUS | Status: DC | PRN
  Administered 2017-02-10: 75 ug/kg/min via INTRAVENOUS

## 2017-02-10 MED ORDER — SODIUM CHLORIDE 0.9 % IV SOLN: INTRAVENOUS | Status: DC

## 2017-02-10 MED ORDER — M.V.I. ADULT IV INJ
INJECTION | INTRAVENOUS | Status: AC
  Administered 2017-02-10: 18:00:00 via INTRAVENOUS
  Filled 2017-02-10: qty 1990

## 2017-02-10 MED ORDER — PROPOFOL 10 MG/ML IV BOLUS
INTRAVENOUS | Status: DC | PRN
  Administered 2017-02-10 (×2): 20 mg via INTRAVENOUS

## 2017-02-10 MED ORDER — BUTAMBEN-TETRACAINE-BENZOCAINE 2-2-14 % EX AERO
INHALATION_SPRAY | CUTANEOUS | Status: DC | PRN
  Administered 2017-02-10: 2 via TOPICAL

## 2017-02-10 NOTE — Progress Notes (Signed)
Nutrition Follow-up  DOCUMENTATION CODES:   Non-severe (moderate) malnutrition in context of acute illness/injury, Obesity unspecified  INTERVENTION:   -TPN management per pharmacy  NUTRITION DIAGNOSIS:   Malnutrition related to chronic illness, acute illness as evidenced by mild depletion of muscle mass, mild depletion of body fat.  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 Had prolonged postop course due to delayed gastric emptying due to gastroparesis and intolerance of TNA, ultimately started on TPN. Represents from SNF after being discharged for less than 12 hours, due to vomiting and abdominal pain.   Pt continues with nausea and vomiting; poor tolerance of clear liquid diet, noted 0-5% meal completion. Pt NPO for EGD to evaluate for evidence of gastroparesis or gastric outlet obstruction.  Pt transitioned to cyclic TPN on 09/01/41. Pt receiving cyclic Clinimix E 5/39, infuse 1990 mls over 20 hours:  50 ml/hr x 1 hr, then 105 ml/hr x 18 hrs, then 50 ml/hr x 1 hr with 20% ILE at 20 ml/hr x 12 hrs. TPN provides 1893 kCal and 100 gm of protein per day, meeting 100% of total needs  Labs reviewed: CBGS: 128-177 (trending down). Pharmacy monitoring CBS to assess tolerance of cyclic TPN.  Diet Order:  Diet clear liquid Room service appropriate? Yes; Fluid consistency: Thin TPN (CLINIMIX-E) Adult TPN (CLINIMIX-E) Adult  Skin:   (closed abdominal incision)  Last BM:  02/06/17  Height:   Ht Readings from Last 1 Encounters:  02/06/17 5' 4"  (1.626 m)    Weight:   Wt Readings from Last 1 Encounters:  02/06/17 198 lb 3.1 oz (89.9 kg)    Ideal Body Weight:  54.54 kg  BMI:  Body  mass index is 34.02 kg/m.  Estimated Nutritional Needs:   Kcal:  1800-1950 (20-22 kcal/kg bw)  Protein:  82-93g Pro (1.5-1.7g/kg ibw)  Fluid:  1.8-2 L fluid (1 ml/kcal)  EDUCATION NEEDS:   No education needs identified at this time  Kaylee Mullins, RD, LDN, CDE Pager: [phone] After hours Pager: [phone]

## 2017-02-10 NOTE — Progress Notes (Signed)
PHARMACY - ADULT TOTAL PARENTERAL NUTRITION CONSULT NOTE   Pharmacy Consult:  TPN Indication: Ileus  Patient Measurements: Height: 5\' 4"  (162.6 cm) Weight: 198 lb 3.1 oz (89.9 kg) IBW/kg (Calculated) : 54.7 TPN AdjBW (KG): 63.5 Body mass index is 34.02 kg/m.  Assessment:  55 YOF with severe malnutrition related to renal cell and pancreatic cancer who presented with abdominal mass on 01/12/17.  She is s/p Whipple procedure on the same day.  She was discharged 02/05/17 and readmitted same day for vomiting at SNF. Pharmacy consulted to manage TPN for ileus that prevented oral intake and caused significant nausea and vomiting.   GI: prealbumin WNL at 19.3.  Erythromycin for gastroparesis/delayed emptying, LBM 6/24, emesis x1 - PRN Zofran/Phenergan (QTc 479ms on 6/17), PPI PO.  Endo: hypothyroid on Synthroid.  DM not on med PTA - CBGs controlled Insulin requirements in the past 24 hours: 16 units SSI + 50 units in TPN Lytes: 6/25 labs - CL slightly elevated, others WNL Renal: SCr 0.93 (BL SCr ~ 0.8), BUN down to 32 - NS 20K at 50 ml/hr, UOP 0.8 ml/kg/hr Pulm: stable on RA Cards: HTN - VSS - amlodipine, irbesartan, Toprol AC: Lovenox for new DVT - dose appropriate, mild anemia, plts WNL Hepatobil: LFTs / tbili / TG WNL Neuro: hx CVA 2016 with residual L-hand and foot weakness ID: afebrile, WBC WNL - not on abx Best Practices: Lovenox  TPN Access: triple lumen PICC placed 6/11 - replaced 02/02/17 TPN start date: 01/23/17  Nutritional Goals (per RD recommendation on 6/23): 1800-1950 kCal, 82-93 g protein per day  Current Nutrition:  TPN  Clear liquid diet (minimal intake, feels full and food doesn't digest)   Plan:   - Continue cyclic Clinimix E 3/20, infuse 1990 mls over 20 hours:  50 ml/hr x 1 hr, then 105 ml/hr x 18 hrs, then 50 ml/hr x 1 hr. - 20% ILE at 20 ml/hr x 12 hrs - TPN provides 1893 kCal and 100 gm of protein per day, meeting 100% of total needs - Daily multivitamin in  TPN - Trace elements in TPN every other day d/t shortage, next due 6/27 - Continue resistant SSI Q4H + 50 units insulin in TPN.  Watch CBGs closely as patient may not tolerate cyclic TPN. - NS 23X at 50 ml/hr per MD - F/U PO intake, AM labs    Glennette Galster D. Mina Marble, PharmD, BCPS Pager:  [phone] 02/10/2017, 7:55 AM

## 2017-02-10 NOTE — Progress Notes (Signed)
Patient ID: Kaylee Mullins, female   DOB: 01-24-1936, 81 y.o.   MRN: [id number]  Progress Note: General Surgery Service   Assessment/Plan: Patient Active Problem List   Diagnosis Date Noted  . Malnutrition of moderate degree 02/07/2017  . Gastroparesis 02/06/2017  . Adenocarcinoma ([place]) 01/12/2017  . 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 Essential hypertension 02/11/2016  . Lumbar stenosis with neurogenic claudication 02/10/2016  . Hyperlipidemia 02/10/2016  . Hypothyroidism 02/10/2016  . Musculoskeletal neck pain 12/13/2014  . Muscular pain 12/13/2014  . Numbness   . Acute CVA (cerebrovascular accident) ([place]) 09/16/2014  . Right renal mass 09/13/2014  . Cerebral infarction due to unspecified mechanism   . CVA (cerebral infarction) 09/11/2014  . Osteoarthritis 09/11/2014  . Postsurgical hypothyroidism 09/11/2014  . Uncontrolled hypertension 09/11/2014  . History of CVA (cerebral vascular accident) ([place]) 09/11/2014  . Diabetes mellitus without complication ([place]) 58/85/0277  . Dyslipidemia 04/20/2007   s/p  Whipple with complication of gastroparesis and persistent n/v -SIPS -continue TPN -up to chair and ambulate Ask GI to do endoscopy to evaluate outflow of GJ. Social work consult   LOS: 4 days  Chief Complaint/Subjective: 1 x emesis.  Feels like she may throw up again.    Objective: Vital signs in last 24 hours: Temp:  [98 F (36.7 C)-98.7 F (37.1 C)] 98 F (36.7 C) (06/27 1229) Pulse Rate:  [61-67] 66 (06/27 1229) Resp:  [17-19] 19 (06/27 1229) BP: (142-153)/(48-58) 151/48 (06/27 1229) SpO2:  [99 %-100 %] 100 % (06/27 1229) Last BM Date: 02/06/17  Intake/Output from previous day: 06/26 0701 - 06/27 0700 In: 3713.5 [P.O.:240; I.V.:3473.5] Out: 1700 [Urine:1700] Intake/Output this shift: Total I/O In: 130 [P.O.:120; I.V.:10] Out: 300 [Emesis/NG output:300]  Lungs: breathing comfortably  Abd: soft, NT, slightly bloated compared to previous, incision  c/d/i  Extremities: no edema  Neuro: AOx4  Lab Results: CBC   Recent Labs  02/08/17 0427  WBC 6.5  HGB 9.1*  HCT 29.4*  PLT 193   BMET  Recent Labs  02/08/17 0427 02/10/17 0938  NA 140 139  K 4.3 4.0  CL 112* 111  CO2 23 24  GLUCOSE 107* 96  BUN 32* 25*  CREATININE 0.93 0.96  CALCIUM 8.8* 9.2   PT/INR No results for input(s): LABPROT, INR in the last 72 hours. ABG No results for input(s): PHART, HCO3 in the last 72 hours.  Invalid input(s): PCO2, PO2  Studies/Results:  Anti-infectives: Anti-infectives    Start     Dose/Rate Route Frequency Ordered Stop   02/09/17 0600  [MAR Hold]  erythromycin (EES) 400 MG/5ML suspension 200 mg     (MAR Hold since 02/10/17 1226)   200 mg Oral Every 6 hours 02/09/17 0147     02/07/17 0000  erythromycin (EES) 400 MG/5ML suspension 400 mg  Status:  Discontinued     400 mg Oral Every 6 hours 02/07/17 1932 02/09/17 0147   02/06/17 0600  erythromycin ethylsuccinate (EES) 200 MG/5ML suspension 400 mg  Status:  Discontinued     400 mg Oral Every 6 hours 02/06/17 0324 02/07/17 1932      Medications: Scheduled Meds: . [MAR Hold] amLODipine  10 mg Oral Daily  . [MAR Hold] enoxaparin  90 mg Subcutaneous Q12H  . [MAR Hold] erythromycin  200 mg Oral Q6H  . [MAR Hold] insulin aspart  0-20 Units Subcutaneous Q4H  . [MAR Hold] irbesartan  300 mg Oral Daily  . [MAR Hold] levothyroxine  200 mcg Oral QAC breakfast  . [  MAR Hold] metoprolol succinate  100 mg Oral Daily  . [MAR Hold] pantoprazole  40 mg Oral QHS   Continuous Infusions: . sodium chloride    . 0.9 % NaCl with KCl 20 mEq / L 50 mL/hr at 02/09/17 1714  . [MAR Hold] .TPN (CLINIMIX-E) Adult     And  . [MAR Hold] fat emulsion    . [MAR Hold] .TPN (CLINIMIX-E) Adult 105 mL/hr at 02/09/17 1907   PRN Meds:.[MAR Hold] acetaminophen **OR** [MAR Hold] acetaminophen, [MAR Hold] albuterol, [MAR Hold]  HYDROmorphone (DILAUDID) injection, [MAR Hold] NIFEdipine, [MAR Hold] ondansetron  **OR** [MAR Hold] ondansetron (ZOFRAN) IV, [MAR Hold] oxyCODONE, [MAR Hold] promethazine, [MAR Hold] sodium chloride flush  [name], MD  [facility]

## 2017-02-10 NOTE — H&P (View-Only) (Signed)
Patient ID: Kaylee Mullins, female   DOB: 1935-09-05, 81 y.o.   MRN: [id number]  Progress Note: General Surgery Service   Assessment/Plan: Patient Active Problem List   Diagnosis Date Noted  . Malnutrition of moderate degree 02/07/2017  . Gastroparesis 02/06/2017  . Adenocarcinoma ([place]) 01/12/2017  . 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 Essential hypertension 02/11/2016  . Lumbar stenosis with neurogenic claudication 02/10/2016  . Hyperlipidemia 02/10/2016  . Hypothyroidism 02/10/2016  . Musculoskeletal neck pain 12/13/2014  . Muscular pain 12/13/2014  . Numbness   . Acute CVA (cerebrovascular accident) ([place]) 09/16/2014  . Right renal mass 09/13/2014  . Cerebral infarction due to unspecified mechanism   . CVA (cerebral infarction) 09/11/2014  . Osteoarthritis 09/11/2014  . Postsurgical hypothyroidism 09/11/2014  . Uncontrolled hypertension 09/11/2014  . History of CVA (cerebral vascular accident) ([place]) 09/11/2014  . Diabetes mellitus without complication ([place]) 22/29/7989  . Dyslipidemia 04/20/2007   s/p  Whipple with complication of gastroparesis and persistent n/v -SIPS -continue TPN -up to chair and ambulate Ask GI to do endoscopy to evaluate outflow of GJ. Social work consult   LOS: 4 days  Chief Complaint/Subjective: 1 x emesis.  Feels like she may throw up again.    Objective: Vital signs in last 24 hours: Temp:  [98 F (36.7 C)-98.7 F (37.1 C)] 98 F (36.7 C) (06/27 1229) Pulse Rate:  [61-67] 66 (06/27 1229) Resp:  [17-19] 19 (06/27 1229) BP: (142-153)/(48-58) 151/48 (06/27 1229) SpO2:  [99 %-100 %] 100 % (06/27 1229) Last BM Date: 02/06/17  Intake/Output from previous day: 06/26 0701 - 06/27 0700 In: 3713.5 [P.O.:240; I.V.:3473.5] Out: 1700 [Urine:1700] Intake/Output this shift: Total I/O In: 130 [P.O.:120; I.V.:10] Out: 300 [Emesis/NG output:300]  Lungs: breathing comfortably  Abd: soft, NT, slightly bloated compared to previous, incision  c/d/i  Extremities: no edema  Neuro: AOx4  Lab Results: CBC   Recent Labs  02/08/17 0427  WBC 6.5  HGB 9.1*  HCT 29.4*  PLT 193   BMET  Recent Labs  02/08/17 0427 02/10/17 0938  NA 140 139  K 4.3 4.0  CL 112* 111  CO2 23 24  GLUCOSE 107* 96  BUN 32* 25*  CREATININE 0.93 0.96  CALCIUM 8.8* 9.2   PT/INR No results for input(s): LABPROT, INR in the last 72 hours. ABG No results for input(s): PHART, HCO3 in the last 72 hours.  Invalid input(s): PCO2, PO2  Studies/Results:  Anti-infectives: Anti-infectives    Start     Dose/Rate Route Frequency Ordered Stop   02/09/17 0600  [MAR Hold]  erythromycin (EES) 400 MG/5ML suspension 200 mg     (MAR Hold since 02/10/17 1226)   200 mg Oral Every 6 hours 02/09/17 0147     02/07/17 0000  erythromycin (EES) 400 MG/5ML suspension 400 mg  Status:  Discontinued     400 mg Oral Every 6 hours 02/07/17 1932 02/09/17 0147   02/06/17 0600  erythromycin ethylsuccinate (EES) 200 MG/5ML suspension 400 mg  Status:  Discontinued     400 mg Oral Every 6 hours 02/06/17 0324 02/07/17 1932      Medications: Scheduled Meds: . [MAR Hold] amLODipine  10 mg Oral Daily  . [MAR Hold] enoxaparin  90 mg Subcutaneous Q12H  . [MAR Hold] erythromycin  200 mg Oral Q6H  . [MAR Hold] insulin aspart  0-20 Units Subcutaneous Q4H  . [MAR Hold] irbesartan  300 mg Oral Daily  . [MAR Hold] levothyroxine  200 mcg Oral QAC breakfast  . [  MAR Hold] metoprolol succinate  100 mg Oral Daily  . [MAR Hold] pantoprazole  40 mg Oral QHS   Continuous Infusions: . sodium chloride    . 0.9 % NaCl with KCl 20 mEq / L 50 mL/hr at 02/09/17 1714  . [MAR Hold] .TPN (CLINIMIX-E) Adult     And  . [MAR Hold] fat emulsion    . [MAR Hold] .TPN (CLINIMIX-E) Adult 105 mL/hr at 02/09/17 1907   PRN Meds:.[MAR Hold] acetaminophen **OR** [MAR Hold] acetaminophen, [MAR Hold] albuterol, [MAR Hold]  HYDROmorphone (DILAUDID) injection, [MAR Hold] NIFEdipine, [MAR Hold] ondansetron  **OR** [MAR Hold] ondansetron (ZOFRAN) IV, [MAR Hold] oxyCODONE, [MAR Hold] promethazine, [MAR Hold] sodium chloride flush  Kaedon Fanelli, MD  [facility]

## 2017-02-10 NOTE — Anesthesia Procedure Notes (Signed)
Procedure Name: MAC Date/Time: 02/10/2017 1:10 PM Performed by: Jenne Campus Pre-anesthesia Checklist: Patient identified, Emergency Drugs available, Suction available, Patient being monitored and Timeout performed Oxygen Delivery Method: Nasal cannula

## 2017-02-10 NOTE — Interval H&P Note (Signed)
History and Physical Interval Note:  02/10/2017 1:03 PM  Kaylee Mullins  has presented today for surgery, with the diagnosis of Nausea and vomiting  The various methods of treatment have been discussed with the patient and family. After consideration of risks, benefits and other options for treatment, the patient has consented to  Procedure(s): ESOPHAGOGASTRODUODENOSCOPY (EGD) (N/A) as a surgical intervention .  The patient's history has been reviewed, patient examined, no change in status, stable for surgery.  I have reviewed the patient's chart and labs.  Questions were answered to the patient's satisfaction.     [name]

## 2017-02-10 NOTE — Op Note (Signed)
[facility] Patient Name: Kaylee Mullins Procedure Date : 02/10/2017 MRN: [id number] Attending MD: Missy Sabins , MD Date of Birth: Feb 04, 1936 CSN: [id number] Age: 81 Admit Type: Outpatient Procedure:                Upper GI endoscopy Indications:              Nausea with vomiting Providers:                Elyse Jarvis. Amedeo Plenty, MD, Burtis Junes, RN, Marcene Duos,                            Technician Referring MD:              Medicines:                Propofol per Anesthesia Complications:            No immediate complications. Estimated Blood Loss:     Estimated blood loss: none. Procedure:                Pre-Anesthesia Assessment:                           - Prior to the procedure, a History and Physical                            was performed, and patient medications and                            allergies were reviewed. The patient's tolerance of                            previous anesthesia was also reviewed. The risks                            and benefits of the procedure and the sedation                            options and risks were discussed with the patient.                            All questions were answered, and informed consent                            was obtained. Prior Anticoagulants: The patient has                            taken no previous anticoagulant or antiplatelet                            agents. ASA Grade Assessment: III - A patient with                            severe systemic disease. After reviewing the risks  and benefits, the patient was deemed in                            satisfactory condition to undergo the procedure.                           After obtaining informed consent, the endoscope was                            passed under direct vision. Throughout the                            procedure, the patient's blood pressure, pulse, and                            oxygen saturations were monitored  continuously. The                            Endosonoscope was introduced through the mouth, and                            advanced to the afferent jejunal loop. The upper GI                            endoscopy was technically difficult and complex due                            to post-surgical anatomy. The patient tolerated the                            procedure well. Scope In: Scope Out: Findings:      The examined esophagus was normal.      Evidence of a patent Billroth II gastrojejunostomy was found. The       gastrojejunal anastomosis was characterized by healthy appearing mucosa.       This was traversed. The efferent limb was not examined as it could not       be found. The afferent limb was examined 25 cm from anastomosis and was       characterized by healthy appearing mucosa.at the termination was a       patent hepaticojejunostomy with the right and left ducts visualized.       Adjacent to this the limb appeared to terminate and a protruding       pancreatic duct stent could be seen. Impression:               - Normal esophagus.                           - Patent Billroth II gastrojejunostomy was found,                            characterized by healthy appearing mucosa.                           - No specimens collected. Moderate Sedation:  no moderate sedation Recommendation:           - NPO.                           - Continue present medications.                           - The findings and recommendations were discussed                            with the surgeon. Procedure Code(s):        --- Professional ---                           [phone], Esophagogastroduodenoscopy, flexible,                            transoral; diagnostic, including collection of                            specimen(s) by brushing or washing, when performed                            (separate procedure) Diagnosis Code(s):        --- Professional ---                           Z98.0,  Intestinal bypass and anastomosis status                           R11.2, Nausea with vomiting, unspecified CPT copyright 2016 American Medical Association. All rights reserved. The codes documented in this report are preliminary and upon coder review may  be revised to meet current compliance requirements. Missy Sabins, MD 02/10/2017 2:00:12 PM This report has been signed electronically. Number of Addenda: 0

## 2017-02-10 NOTE — Anesthesia Postprocedure Evaluation (Signed)
Anesthesia Post Note  Patient: Kaylee Mullins  Procedure(s) Performed: Procedure(s) (LRB): ESOPHAGOGASTRODUODENOSCOPY (EGD) (N/A)     Patient location during evaluation: PACU Anesthesia Type: MAC Level of consciousness: awake and alert Pain management: pain level controlled Vital Signs Assessment: post-procedure vital signs reviewed and stable Respiratory status: spontaneous breathing, nonlabored ventilation, respiratory function stable and patient connected to nasal cannula oxygen Cardiovascular status: stable and blood pressure returned to baseline Anesthetic complications: no    Last Vitals:  Vitals:   02/10/17 1400 02/10/17 1529  BP: (!) 162/48 (!) 127/99  Pulse: 82 71  Resp: (!) 23   Temp:  36.7 C    Last Pain:  Vitals:   02/10/17 1529  TempSrc: Oral  PainSc:                  Christine Schiefelbein DAVID

## 2017-02-10 NOTE — Anesthesia Preprocedure Evaluation (Deleted)
Anesthesia Evaluation  Patient identified by MRN, date of birth, ID band Patient awake    Reviewed: Allergy & Precautions, NPO status , Patient's Chart, lab work & pertinent test results  Airway Mallampati: II  TM Distance: >3 FB Neck ROM: Full    Dental  (+) Edentulous Upper, Edentulous Lower, Dental Advisory Given   Pulmonary           Cardiovascular hypertension, Pt. on home beta blockers and Pt. on medications      Neuro/Psych Left-sided weakness. CVA, Residual Symptoms    GI/Hepatic   Endo/Other  diabetes  Renal/GU      Musculoskeletal   Abdominal   Peds  Hematology   Anesthesia Other Findings   Reproductive/Obstetrics                             Anesthesia Physical Anesthesia Plan Anesthesia Quick Evaluation

## 2017-02-10 NOTE — Progress Notes (Signed)
EGD done: Discussed with Dr. [name]. Difficult anatomy to examine. The esophagus was normal.Large J-shaped stomach with a lot of bilious fluid, gastric outlet very difficult to visualize within folds. Eventually a single limb was explored which was the afferent limb which terminated and a hepatic jejunostomy where the left and right hepatic ducts were visible and unobstructed. Just adjacent was seen a blind ending with a nearby pancreatic stent in place. Efferent limb was never visualized. Results Discussed with Dr. [name] and plan for NG tube and possibly repeat barium evaluation. Please call if further GI input needed.

## 2017-02-10 NOTE — Transfer of Care (Signed)
Immediate Anesthesia Transfer of Care Note  Patient: Kaylee Mullins  Procedure(s) Performed: Procedure(s): ESOPHAGOGASTRODUODENOSCOPY (EGD) (N/A)  Patient Location: PACU  Anesthesia Type:MAC  Level of Consciousness: awake, oriented and patient cooperative  Airway & Oxygen Therapy: Patient Spontanous Breathing and Patient connected to nasal cannula oxygen  Post-op Assessment: Report given to RN and Post -op Vital signs reviewed and stable  Post vital signs: Reviewed  Last Vitals:  Vitals:   02/10/17 1229 02/10/17 1346  BP: (!) 151/48 (!) 156/110  Pulse: 66 67  Resp: 19 20  Temp: 36.7 C     Last Pain:  Vitals:   02/10/17 1346  TempSrc: Oral  PainSc:       Patients Stated Pain Goal: 2 (95/28/41 3244)  Complications: No apparent anesthesia complications

## 2017-02-10 NOTE — Consult Note (Signed)
[facility] Consult Note  Referring Provider: No ref. provider found Primary Care Physician:  Nolene Ebbs, MD Primary Gastroenterologist:  Dr.  Laurel Dimmer Complaint: Nausea and vomiting HPI: Kaylee Mullins is an 81 y.o. 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 Denies any significant abdominal pain or hematemesis. She is currently on IV erythromycin. EGD is requested to assess for gastric or intestinal outlet obstruction or evidence of gastroparesis. She has been an insulin-dependent diabetic for about 10 years.  Past Medical History:  Diagnosis Date  . Anxiety   . Arthritis    knees  . Black tarry stools    05-14-16 negative for occult blood with ER visit- noted in [place].  . Cancer [facility])    thyroid cancer- surgery and radiation  . Chronic kidney disease    questionable mass on kidney. Being followed by Dr Diona Fanti  . Complication of anesthesia    heart rate was really low  . Depression   . Diabetes mellitus    type 2  . Full dentures   . Hypertension   . Hypothyroidism   . Pneumonia   . Spinal stenosis   . Stroke [facility] 09/2014   left sided weakness    Past Surgical History:  Procedure Laterality Date  . ABDOMINAL HYSTERECTOMY     partial  . cataracts     Removed  11/2015  bilateral  . COLONOSCOPY W/ POLYPECTOMY    . ERCP N/A 05/07/2016   Procedure: ENDOSCOPIC RETROGRADE CHOLANGIOPANCREATOGRAPHY (ERCP);  Surgeon: Clarene Essex, MD;  Location: [facility] ENDOSCOPY;  Service: Endoscopy;  Laterality: N/A;  . ERCP N/A 10/16/2016   Procedure: ENDOSCOPIC RETROGRADE CHOLANGIOPANCREATOGRAPHY (ERCP);  Surgeon: Clarene Essex, MD;  Location: [facility] ENDOSCOPY;  Service: Endoscopy;  Laterality: N/A;  . EUS N/A 05/27/2016   Procedure: ESOPHAGEAL ENDOSCOPIC ULTRASOUND (EUS) RADIAL;  Surgeon: Arta Silence, MD;   Location: [facility] ENDOSCOPY;  Service: Endoscopy;  Laterality: N/A;  . FLEXIBLE SIGMOIDOSCOPY  03/29/2012   Procedure: FLEXIBLE SIGMOIDOSCOPY;  Surgeon: Jeryl Columbia, MD;  Location: [facility] ENDOSCOPY;  Service: Endoscopy;  Laterality: N/A;  fleet enema upon arrival  . HOT HEMOSTASIS  03/29/2012   Procedure: HOT HEMOSTASIS (ARGON PLASMA COAGULATION/BICAP);  Surgeon: Jeryl Columbia, MD;  Location: [facility] ENDOSCOPY;  Service: Endoscopy;  Laterality: N/A;  . IR GENERIC HISTORICAL  06/30/2016   IR RADIOLOGIST EVAL & MGMT 06/30/2016 Aletta Edouard, MD [facility] INTERV RAD  . IR GENERIC HISTORICAL  09/09/2016   IR RADIOLOGIST EVAL & MGMT 09/09/2016 Aletta Edouard, MD [facility] INTERV RAD  . LAPAROSCOPY N/A 01/12/2017   Procedure: LAPAROSCOPY DIAGNOSTIC;  Surgeon: Stark Klein, MD;  Location: [place];  Service: General;  Laterality: N/A;  . LUMBAR LAMINECTOMY/DECOMPRESSION MICRODISCECTOMY N/A 02/10/2016   Procedure: Lumbar three- four Laminectomy;  Surgeon: Kristeen Miss, MD;  Location: [facility] NEURO ORS;  Service: Neurosurgery;  Laterality: N/A;  L3-4 Laminectomy  . LUMBAR SPINE SURGERY     1st surgery "ray cage placed"  . THYROIDECTOMY  2005  . TONSILLECTOMY    . WHIPPLE PROCEDURE N/A 01/12/2017   Procedure: WHIPPLE PROCEDURE;  Surgeon: Stark Klein, MD;  Location: [place];  Service: General;  Laterality: N/A;    Medications Prior to Admission  Medication Sig Dispense Refill  . acetaminophen (TYLENOL) 500 MG tablet Take 500-1,000 mg by mouth every 6 (six) hours as needed for mild pain or moderate pain (depends on pain  if takes 1-2 tablets).    Marland Kitchen albuterol (PROVENTIL) (2.5 MG/3ML) 0.083% nebulizer solution Take 3 mLs (2.5 mg total) by nebulization every 4 (four) hours as needed for wheezing or shortness of breath. 75 mL 12  . amLODipine (NORVASC) 10 MG tablet Take 1 tablet (10 mg total) by mouth daily. 30 tablet 0  . aspirin (ASPIRIN CHILDRENS) 81 MG chewable tablet Chew 1 tablet (81 mg total) by mouth daily. 36 tablet 11  .  enoxaparin (LOVENOX) 100 MG/ML injection Inject 0.9 mLs (90 mg total) into the skin every 12 (twelve) hours. 30 Syringe 0  . erythromycin ethylsuccinate (EES) 200 MG/5ML suspension Take 10 mLs (400 mg total) by mouth every 6 (six) hours. 200 mL 2  . furosemide (LASIX) 40 MG tablet Take 40 mg by mouth daily as needed (leg swelling).    . insulin aspart (NOVOLOG) 100 UNIT/ML injection Inject 0-15 Units into the skin every 4 (four) hours. 10 mL 11  . irbesartan (AVAPRO) 300 MG tablet Take 1 tablet (300 mg total) by mouth daily. 30 tablet 0  . levothyroxine (SYNTHROID, LEVOTHROID) 200 MCG tablet Take 200 mcg by mouth daily before breakfast. For hypothyroidism  99  . metoprolol succinate (TOPROL-XL) 100 MG 24 hr tablet Take 100 mg by mouth daily. Take with or immediately following a meal.     . NIFEdipine (PROCARDIA-XL/ADALAT CC) 30 MG 24 hr tablet Take 1 tablet (30 mg total) by mouth 2 (two) times daily as needed (systolic BP over 893 mmHg). 60 tablet 0  . oxyCODONE (ROXICODONE) 5 MG/5ML solution Take 5-10 mLs (5-10 mg total) by mouth every 4 (four) hours as needed for moderate pain or severe pain. 473 mL 0  . oxyCODONE-acetaminophen (PERCOCET/ROXICET) 5-325 MG tablet Take 1-2 tablets by mouth every 4 (four) hours as needed for moderate pain. (Patient taking differently: Take 0.5-1 tablets by mouth every 4 (four) hours as needed for moderate pain. Depends on pain if takes 0.5-1 tablet) 60 tablet 0  . pantoprazole (PROTONIX) 40 MG tablet Take 1 tablet (40 mg total) by mouth at bedtime. 30 tablet 0  . potassium chloride SA (K-DUR,KLOR-CON) 20 MEQ tablet Take 20 mEq by mouth 2 (two) times daily.    . rosuvastatin (CRESTOR) 20 MG tablet Take 20 mg by mouth daily.     Marland Kitchen telmisartan-hydrochlorothiazide (MICARDIS HCT) 80-25 MG tablet Take 1 tablet by mouth daily.       Allergies:  Allergies  Allergen Reactions  . Hydralazine Hcl Shortness Of Breath    Pt had severe respiratory distress after receiving a  dose  . Darvon [Propoxyphene Hcl] Other (See Comments)    Hallucinations   . Nyquil Multi-Symptom [Pseudoeph-Doxylamine-Dm-Apap] Other (See Comments)    Makes pt not "feel right in her head"  . Metformin And Related Diarrhea    Family History  Problem Relation Age of Onset  . Heart failure Mother   . Heart attack Father     Social History:  reports that she has never smoked. She has never used smokeless tobacco. She reports that she does not drink alcohol or use drugs.  Review of Systems: negative except As above   Blood pressure (!) 142/53, pulse 66, temperature 98.3 F (36.8 C), temperature source Oral, resp. rate 17, height 5\' 4"  (1.626 m), weight 89.9 kg (198 lb 3.1 oz), SpO2 99 %. Head: Normocephalic, without obvious abnormality, atraumatic Neck: no adenopathy, no carotid bruit, no JVD, supple, symmetrical, trachea midline and thyroid not enlarged, symmetric, no tenderness/mass/nodules Resp:  clear to auscultation bilaterally Cardio: regular rate and rhythm, S1, S2 normal, no murmur, click, rub or gallop GI: Abdomen soft slightly distended nontender Extremities: extremities normal, atraumatic, no cyanosis or edema  Results for orders placed or performed during the hospital encounter of 02/05/17 (from the past 48 hour(s))  Glucose, capillary     Status: Abnormal   Collection Time: 02/08/17 12:19 PM  Result Value Ref Range   Glucose-Capillary 108 (H) 65 - 99 mg/dL  Glucose, capillary     Status: Abnormal   Collection Time: 02/08/17  5:56 PM  Result Value Ref Range   Glucose-Capillary 134 (H) 65 - 99 mg/dL  Glucose, capillary     Status: Abnormal   Collection Time: 02/08/17  8:02 PM  Result Value Ref Range   Glucose-Capillary 190 (H) 65 - 99 mg/dL  Glucose, capillary     Status: Abnormal   Collection Time: 02/08/17 11:48 PM  Result Value Ref Range   Glucose-Capillary 174 (H) 65 - 99 mg/dL  Glucose, capillary     Status: Abnormal   Collection Time: 02/09/17  4:32 AM   Result Value Ref Range   Glucose-Capillary 164 (H) 65 - 99 mg/dL  Glucose, capillary     Status: Abnormal   Collection Time: 02/09/17  7:15 AM  Result Value Ref Range   Glucose-Capillary 149 (H) 65 - 99 mg/dL  Glucose, capillary     Status: Abnormal   Collection Time: 02/09/17 12:03 PM  Result Value Ref Range   Glucose-Capillary 133 (H) 65 - 99 mg/dL  Glucose, capillary     Status: Abnormal   Collection Time: 02/09/17  5:02 PM  Result Value Ref Range   Glucose-Capillary 150 (H) 65 - 99 mg/dL  Glucose, capillary     Status: Abnormal   Collection Time: 02/09/17  8:24 PM  Result Value Ref Range   Glucose-Capillary 148 (H) 65 - 99 mg/dL  Glucose, capillary     Status: Abnormal   Collection Time: 02/10/17 12:26 AM  Result Value Ref Range   Glucose-Capillary 177 (H) 65 - 99 mg/dL  Glucose, capillary     Status: Abnormal   Collection Time: 02/10/17  4:14 AM  Result Value Ref Range   Glucose-Capillary 173 (H) 65 - 99 mg/dL  Glucose, capillary     Status: Abnormal   Collection Time: 02/10/17  8:03 AM  Result Value Ref Range   Glucose-Capillary 141 (H) 65 - 99 mg/dL   No results found.  Assessment: Persistent nausea and vomiting after Whipple procedure for adenocarcinoma of the pancreas Plan:  Will proceed with EGD today as requested. Sterling Mondo C 02/10/2017, 9:35 AM  Pager [phone] If no answer or after 5 PM call [phone]

## 2017-02-10 NOTE — Anesthesia Preprocedure Evaluation (Addendum)
Anesthesia Evaluation  Patient identified by MRN, date of birth, ID band Patient awake    Reviewed: Allergy & Precautions, NPO status , Patient's Chart, lab work & pertinent test results, reviewed documented beta blocker date and time   Airway Mallampati: II  TM Distance: >3 FB Neck ROM: Full    Dental  (+) Edentulous Upper, Edentulous Lower, Dental Advisory Given   Pulmonary    Pulmonary exam normal        Cardiovascular hypertension, Pt. on medications and Pt. on home beta blockers Normal cardiovascular exam Rhythm:Regular Rate:Normal     Neuro/Psych Left-sided weakness. CVA, Residual Symptoms    GI/Hepatic Whipple procedure  12/2016   Endo/Other  diabetes, Type 2, Insulin DependentHypothyroidism   Renal/GU      Musculoskeletal   Abdominal   Peds  Hematology   Anesthesia Other Findings   Reproductive/Obstetrics                           Anesthesia Physical Anesthesia Plan  ASA: III  Anesthesia Plan: MAC   Post-op Pain Management:    Induction: Intravenous  PONV Risk Score and Plan: 2 and Ondansetron and Treatment may vary due to age or medical condition  Airway Management Planned:   Additional Equipment:   Intra-op Plan:   Post-operative Plan:   Informed Consent: I have reviewed the patients History and Physical, chart, labs and discussed the procedure including the risks, benefits and alternatives for the proposed anesthesia with the patient or authorized representative who has indicated his/her understanding and acceptance.     Plan Discussed with: CRNA and Surgeon  Anesthesia Plan Comments:         Anesthesia Quick Evaluation

## 2017-02-11 LAB — GLUCOSE, CAPILLARY
Glucose-Capillary: 105 mg/dL — ABNORMAL HIGH (ref 65–99)
Glucose-Capillary: 143 mg/dL — ABNORMAL HIGH (ref 65–99)
Glucose-Capillary: 151 mg/dL — ABNORMAL HIGH (ref 65–99)
Glucose-Capillary: 152 mg/dL — ABNORMAL HIGH (ref 65–99)
Glucose-Capillary: 166 mg/dL — ABNORMAL HIGH (ref 65–99)
Glucose-Capillary: 188 mg/dL — ABNORMAL HIGH (ref 65–99)
Glucose-Capillary: 196 mg/dL — ABNORMAL HIGH (ref 65–99)

## 2017-02-11 LAB — COMPREHENSIVE METABOLIC PANEL
ALT: 14 U/L (ref 14–54)
AST: 17 U/L (ref 15–41)
Albumin: 3.3 g/dL — ABNORMAL LOW (ref 3.5–5.0)
Alkaline Phosphatase: 60 U/L (ref 38–126)
Anion gap: 9 (ref 5–15)
BUN: 21 mg/dL — ABNORMAL HIGH (ref 6–20)
CO2: 20 mmol/L — ABNORMAL LOW (ref 22–32)
Calcium: 9 mg/dL (ref 8.9–10.3)
Chloride: 110 mmol/L (ref 101–111)
Creatinine, Ser: 0.86 mg/dL (ref 0.44–1.00)
GFR calc Af Amer: >60 mL/min (ref >60)
GFR calc non Af Amer: >60 mL/min (ref >60)
Glucose, Bld: 188 mg/dL — ABNORMAL HIGH (ref 65–99)
Potassium: 4 mmol/L (ref 3.5–5.1)
Sodium: 139 mmol/L (ref 135–145)
Total Bilirubin: 0.4 mg/dL (ref 0.3–1.2)
Total Protein: 6.5 g/dL (ref 6.5–8.1)

## 2017-02-11 LAB — MAGNESIUM: Magnesium: 1.7 mg/dL (ref 1.7–2.4)

## 2017-02-11 LAB — PHOSPHORUS: Phosphorus: 3.5 mg/dL (ref 2.5–4.6)

## 2017-02-11 MED ORDER — CHLORHEXIDINE GLUCONATE 0.12 % MT SOLN
15.0000 mL | Freq: Two times a day (BID) | OROMUCOSAL | Status: DC
Start: 2017-02-11 — End: 2017-03-29
  Administered 2017-02-11 – 2017-03-29 (×64): 15 mL via OROMUCOSAL
  Filled 2017-02-11 (×54): qty 15

## 2017-02-11 MED ORDER — LEVOTHYROXINE SODIUM 100 MCG PO TABS
200.0000 ug | ORAL_TABLET | Freq: Every day | ORAL | Status: DC
  Administered 2017-02-12 – 2017-03-23 (×37): 200 ug via NASOGASTRIC
  Filled 2017-02-11: qty 4
  Filled 2017-02-11 (×38): qty 2

## 2017-02-11 MED ORDER — IRBESARTAN 300 MG PO TABS
300.0000 mg | ORAL_TABLET | Freq: Every day | ORAL | Status: DC
  Administered 2017-02-12 – 2017-03-01 (×18): 300 mg via NASOGASTRIC
  Filled 2017-02-11 (×19): qty 1

## 2017-02-11 MED ORDER — MAGNESIUM SULFATE 2 GM/50ML IV SOLN
2.0000 g | Freq: Once | INTRAVENOUS | Status: AC
  Administered 2017-02-11: 2 g via INTRAVENOUS
  Filled 2017-02-11: qty 50

## 2017-02-11 MED ORDER — ORAL CARE MOUTH RINSE
15.0000 mL | Freq: Two times a day (BID) | OROMUCOSAL | Status: DC
  Administered 2017-02-11 – 2017-03-29 (×44): 15 mL via OROMUCOSAL

## 2017-02-11 MED ORDER — AMLODIPINE BESYLATE 10 MG PO TABS
10.0000 mg | ORAL_TABLET | Freq: Every day | ORAL | Status: DC
  Administered 2017-02-12 – 2017-03-23 (×37): 10 mg via NASOGASTRIC
  Filled 2017-02-11 (×40): qty 1

## 2017-02-11 MED ORDER — M.V.I. ADULT IV INJ
INTRAVENOUS | Status: AC
  Administered 2017-02-11: 18:00:00 via INTRAVENOUS
  Filled 2017-02-11: qty 1914

## 2017-02-11 MED ORDER — INSULIN ASPART 100 UNIT/ML ~~LOC~~ SOLN
0.0000 [IU] | SUBCUTANEOUS | Status: DC
  Administered 2017-02-11 (×2): 4 [IU] via SUBCUTANEOUS
  Administered 2017-02-11: 3 [IU] via SUBCUTANEOUS
  Administered 2017-02-12 (×3): 4 [IU] via SUBCUTANEOUS
  Administered 2017-02-12: 3 [IU] via SUBCUTANEOUS
  Administered 2017-02-12 – 2017-02-14 (×5): 4 [IU] via SUBCUTANEOUS
  Administered 2017-02-14: 7 [IU] via SUBCUTANEOUS
  Administered 2017-02-14: 4 [IU] via SUBCUTANEOUS
  Administered 2017-02-14: 7 [IU] via SUBCUTANEOUS
  Administered 2017-02-14 – 2017-02-15 (×3): 3 [IU] via SUBCUTANEOUS
  Administered 2017-02-15: 4 [IU] via SUBCUTANEOUS
  Administered 2017-02-15: 7 [IU] via SUBCUTANEOUS
  Administered 2017-02-15 – 2017-02-16 (×2): 4 [IU] via SUBCUTANEOUS
  Administered 2017-02-16: 3 [IU] via SUBCUTANEOUS
  Administered 2017-02-16 (×2): 4 [IU] via SUBCUTANEOUS
  Administered 2017-02-16 (×2): 3 [IU] via SUBCUTANEOUS
  Administered 2017-02-17 (×3): 4 [IU] via SUBCUTANEOUS
  Administered 2017-02-17 (×2): 3 [IU] via SUBCUTANEOUS
  Administered 2017-02-18: 4 [IU] via SUBCUTANEOUS
  Administered 2017-02-18: 7 [IU] via SUBCUTANEOUS
  Administered 2017-02-18: 4 [IU] via SUBCUTANEOUS
  Administered 2017-02-19: 7 [IU] via SUBCUTANEOUS
  Administered 2017-02-19: 3 [IU] via SUBCUTANEOUS
  Administered 2017-02-19 – 2017-02-20 (×3): 4 [IU] via SUBCUTANEOUS
  Administered 2017-02-20 (×2): 3 [IU] via SUBCUTANEOUS
  Administered 2017-02-20: 4 [IU] via SUBCUTANEOUS
  Administered 2017-02-21: 3 [IU] via SUBCUTANEOUS
  Administered 2017-02-21: 4 [IU] via SUBCUTANEOUS
  Administered 2017-02-21: 3 [IU] via SUBCUTANEOUS
  Administered 2017-02-21: 4 [IU] via SUBCUTANEOUS
  Administered 2017-02-22: 7 [IU] via SUBCUTANEOUS
  Administered 2017-02-22 – 2017-02-23 (×4): 3 [IU] via SUBCUTANEOUS
  Administered 2017-02-23 – 2017-02-24 (×3): 4 [IU] via SUBCUTANEOUS
  Administered 2017-02-24 – 2017-02-25 (×3): 3 [IU] via SUBCUTANEOUS
  Administered 2017-02-25 (×2): 4 [IU] via SUBCUTANEOUS
  Administered 2017-02-25: 3 [IU] via SUBCUTANEOUS
  Administered 2017-02-26 (×3): 4 [IU] via SUBCUTANEOUS
  Administered 2017-02-27: 3 [IU] via SUBCUTANEOUS

## 2017-02-11 MED ORDER — FAT EMULSION 20 % IV EMUL
240.0000 mL | INTRAVENOUS | Status: AC
  Administered 2017-02-11: 240 mL via INTRAVENOUS
  Filled 2017-02-11: qty 250

## 2017-02-11 NOTE — Progress Notes (Signed)
PHARMACY - ADULT TOTAL PARENTERAL NUTRITION CONSULT NOTE   Pharmacy Consult:  TPN Indication: Ileus  Patient Measurements: Height: 5\' 4"  (162.6 cm) Weight: 198 lb 3.1 oz (89.9 kg) IBW/kg (Calculated) : 54.7 TPN AdjBW (KG): 63.5 Body mass index is 34.02 kg/m.  Assessment:  79 YOF with severe malnutrition related to renal cell and pancreatic cancer who presented with abdominal mass on 01/12/17.  She is s/p Whipple procedure on the same day for pancreatic cancer.  She was discharged 02/05/17 and readmitted same day for vomiting at SNF. Pharmacy consulted to manage TPN for ileus that prevented oral intake and caused significant nausea and vomiting.   GI: prealbumin WNL at 19.3.  Erythromycin for gastroparesis, BM x1, NG inserted 6/27 and O/P 973mL - PRN Zofran/Phenergan (QTc 460ms on 6/17), PPI PO.  *6/27 EGD: no acute finding, possible repeat barium eval Endo: hypothyroid on Synthroid.  DM not on med PTA - CBGs 128-188 on TPN, 160s off TPN Insulin requirements in the past 24 hours: 19 units SSI + 50 units in TPN Lytes: slightly low CO2, others WNL Renal: SCr 0.86, BUN down to 21 - NS 20K at 50 ml/hr, UOP 0.9 ml/kg/hr Pulm: stable on RA Cards: HTN - BP elevated, HR controlled (meds not given yesterday) - amlodipine, irbesartan, Toprol AC: Lovenox for new DVT - dose appropriate, mild anemia, plts WNL Hepatobil: LFTs / tbili / TG WNL Neuro: hx CVA 2016 with residual L-hand and foot weakness ID: afebrile, WBC WNL - not on abx Best Practices: Lovenox  TPN Access: triple lumen PICC placed 6/11 - replaced 02/02/17 TPN start date: 01/23/17  Nutritional Goals (per RD recommendation on 6/23): 1800-1950 kCal, 82-93 g protein per day  Current Nutrition:  TPN    Plan:   - Reduce cyclic Clinimix E 6/50 cycle, infuse 1914 mls over 16 hours:  75 ml/hr x 1 hr, then 126 ml/hr x 14 hrs, then 75 ml/hr x 1 hr.  Watch CBGs closely. - 20% ILE at 20 ml/hr x 12 hrs - TPN provides 1839 kCal and 96 gm of  protein per day, meeting 100% of total needs - Daily multivitamin in TPN - Trace elements in TPN every other day d/t shortage, next due 6/29 - Continue resistant SSI Q4H (0000, 0500, 0900, 1100, 1500, 2000) + 50 units insulin in TPN.   - Mag sulfate 2gm IV x 1.  Consider repeating labs on Mon. - NS 20K at 50 ml/hr per MD - F/U tolerance to cyclic TPN   Mckenzie Toruno D. Mina Marble, PharmD, BCPS Pager:  [phone] 02/11/2017, 8:43 AM

## 2017-02-11 NOTE — Clinical Social Work Note (Signed)
Clinical Social Work Assessment  Patient Details  Name: Kaylee Mullins MRN: [id number] Date of Birth: 1935-09-11  Date of referral:  02/11/17               Reason for consult:  Facility Placement                Permission sought to share information with:  Chartered certified accountant granted to share information::  Yes, Verbal Permission Granted  Name::        Agency::  [facility]  Relationship::     Contact Information:     Housing/Transportation Living arrangements for the past 2 months:  Single Family Home Source of Information:  Patient Patient Interpreter Needed:  None Criminal Activity/Legal Involvement Pertinent to Current Situation/Hospitalization:  No - Comment as needed Significant Relationships:  Adult Children Lives with:  Self Do you feel safe going back to the place where you live?  No Need for family participation in patient care:  No (Coment)  Care giving concerns:  Pt normally lives at home alone and unable to manage medical needs at this time.   Social Worker assessment / plan:  CSW informed pt from [facility] for [employer] and PT.  Patient reports she was only at SNF for a few hours before began projectile vomiting and was sent back to hospital.  Employment status:  Retired Nurse, adult PT Recommendations:  [place] / Referral to community resources:  [place]  Patient/Family's Response to care:  Patient agreeable to return to [facility] if she still requires TPN- otherwise would want to find facility closer to home.  Patient/Family's Understanding of and Emotional Response to Diagnosis, Current Treatment, and Prognosis:  No questions or concerns at this time- hopeful she will show vast improvement and no longer require TPN.  Emotional Assessment Appearance:  Appears stated age Attitude/Demeanor/Rapport:    Affect (typically observed):   Appropriate Orientation:  Oriented to Self, Oriented to Place, Oriented to  Time, Oriented to Situation Alcohol / Substance use:  Not Applicable Psych involvement (Current and /or in the community):  No (Comment)  Discharge Needs  Concerns to be addressed:  Care Coordination Readmission within the last 30 days:  No Current discharge risk:  Physical Impairment Barriers to Discharge:  Continued Medical Work up   Jorge Ny, LCSW 02/11/2017, 12:09 PM

## 2017-02-11 NOTE — Care Management Important Message (Signed)
Important Message  Patient Details  Name: Kaylee Mullins MRN: [id number] Date of Birth: Jan 26, 1936   Medicare Important Message Given:  Yes    Orbie Pyo 02/11/2017, 9:37 AM

## 2017-02-11 NOTE — Progress Notes (Signed)
[facility] Infusion Coordinator continues to follow pt for any Infusion/TNA needs at DC. I have spoken with Lexine Baton, CSW at [facility] today to update on outcome of EGD from yesterday and documented plan going forward. [facility] team will follow until DC to support TNA needs upon DC as ordered/needed.  If patient discharges after hours, please call [phone].   Larry Sierras 02/11/2017, 10:43 AM

## 2017-02-11 NOTE — Progress Notes (Signed)
Patient ID: Kaylee Mullins, female   DOB: 1935/10/19, 81 y.o.   MRN: [id number]  Progress Note: General Surgery Service   Assessment/Plan: Patient Active Problem List   Diagnosis Date Noted  . Malnutrition of moderate degree 02/07/2017  . Gastroparesis 02/06/2017  . Adenocarcinoma ([place]) 01/12/2017  . 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 Essential hypertension 02/11/2016  . Lumbar stenosis with neurogenic claudication 02/10/2016  . Hyperlipidemia 02/10/2016  . Hypothyroidism 02/10/2016  . Musculoskeletal neck pain 12/13/2014  . Muscular pain 12/13/2014  . Numbness   . Acute CVA (cerebrovascular accident) ([place]) 09/16/2014  . Right renal mass 09/13/2014  . Cerebral infarction due to unspecified mechanism   . CVA (cerebral infarction) 09/11/2014  . Osteoarthritis 09/11/2014  . Postsurgical hypothyroidism 09/11/2014  . Uncontrolled hypertension 09/11/2014  . History of CVA (cerebral vascular accident) ([place]) 09/11/2014  . Diabetes mellitus without complication ([place]) 41/28/7867  . Dyslipidemia 04/20/2007   s/p  Whipple with complication of gastroparesis and persistent n/v -SIPS -continue TPN -up to chair and ambulate NGT to suction out stomach.  Will try repeat contrast study tomorrow in order to see outflow.  On the last contrast study, there was so much fluid in the stomach that the contrast got diluted.   Social work consult working with family.     LOS: 5 days  Chief Complaint/Subjective: EGD yesterday.  Afferent limb cannulated, no issues.  Stomach was very distended and full of bile.  C/o throat pain.    Objective: Vital signs in last 24 hours: Temp:  [98.1 F (36.7 C)-99.1 F (37.3 C)] 99 F (37.2 C) (06/28 1414) Pulse Rate:  [71-83] 83 (06/28 1414) Resp:  [16-18] 18 (06/28 1414) BP: (127-181)/(57-99) 181/71 (06/28 1414) SpO2:  [98 %-100 %] 98 % (06/28 1414) Last BM Date: 02/06/17  Intake/Output from previous day: 06/27 0701 - 06/28 0700 In: 810 [P.O.:120;  I.V.:690] Out: 2850 [Urine:1950; Emesis/NG output:900] Intake/Output this shift: Total I/O In: 0  Out: 400 [Urine:400]  Lungs: breathing comfortably  Abd: soft, NT, minimally distended upper abdomen.  Incision c/d/i  Extremities: no edema  Neuro: AOx4  Lab Results: CBC  No results for input(s): WBC, HGB, HCT, PLT in the last 72 hours. BMET  Recent Labs  02/10/17 0938 02/11/17 0407  NA 139 139  K 4.0 4.0  CL 111 110  CO2 24 20*  GLUCOSE 96 188*  BUN 25* 21*  CREATININE 0.96 0.86  CALCIUM 9.2 9.0   PT/INR No results for input(s): LABPROT, INR in the last 72 hours. ABG No results for input(s): PHART, HCO3 in the last 72 hours.  Invalid input(s): PCO2, PO2  Studies/Results:  Anti-infectives: Anti-infectives    Start     Dose/Rate Route Frequency Ordered Stop   02/09/17 0600  erythromycin (EES) 400 MG/5ML suspension 200 mg     200 mg Oral Every 6 hours 02/09/17 0147     02/07/17 0000  erythromycin (EES) 400 MG/5ML suspension 400 mg  Status:  Discontinued     400 mg Oral Every 6 hours 02/07/17 1932 02/09/17 0147   02/06/17 0600  erythromycin ethylsuccinate (EES) 200 MG/5ML suspension 400 mg  Status:  Discontinued     400 mg Oral Every 6 hours 02/06/17 0324 02/07/17 1932      Medications: Scheduled Meds: . amLODipine  10 mg Oral Daily  . chlorhexidine  15 mL Mouth Rinse BID  . enoxaparin  90 mg Subcutaneous Q12H  . erythromycin  200 mg Oral Q6H  . insulin aspart  0-20 Units Subcutaneous 6 times per day  . irbesartan  300 mg Oral Daily  . levothyroxine  200 mcg Oral QAC breakfast  . mouth rinse  15 mL Mouth Rinse q12n4p  . metoprolol succinate  100 mg Oral Daily  . nystatin  5 mL Oral QID  . pantoprazole (PROTONIX) IV  40 mg Intravenous Q24H   Continuous Infusions: . Marland KitchenTPN (CLINIMIX-E) Adult     And  . fat emulsion    . 0.9 % NaCl with KCl 20 mEq / L 50 mL/hr at 02/11/17 1247   PRN Meds:.acetaminophen **OR** acetaminophen, albuterol, HYDROmorphone  (DILAUDID) injection, NIFEdipine, ondansetron **OR** ondansetron (ZOFRAN) IV, oxyCODONE, phenol, promethazine, sodium chloride flush  Nicholaos Schippers, MD  [facility]

## 2017-02-12 ENCOUNTER — Inpatient Hospital Stay (HOSPITAL_COMMUNITY): Payer: Medicare Other

## 2017-02-12 ENCOUNTER — Encounter (HOSPITAL_COMMUNITY): Payer: Self-pay | Admitting: Gastroenterology

## 2017-02-12 LAB — GLUCOSE, CAPILLARY
Glucose-Capillary: 120 mg/dL — ABNORMAL HIGH (ref 65–99)
Glucose-Capillary: 157 mg/dL — ABNORMAL HIGH (ref 65–99)
Glucose-Capillary: 162 mg/dL — ABNORMAL HIGH (ref 65–99)
Glucose-Capillary: 163 mg/dL — ABNORMAL HIGH (ref 65–99)
Glucose-Capillary: 182 mg/dL — ABNORMAL HIGH (ref 65–99)
Glucose-Capillary: 188 mg/dL — ABNORMAL HIGH (ref 65–99)

## 2017-02-12 LAB — BASIC METABOLIC PANEL
Anion gap: 5 (ref 5–15)
BUN: 27 mg/dL — ABNORMAL HIGH (ref 6–20)
CO2: 22 mmol/L (ref 22–32)
Calcium: 8.8 mg/dL — ABNORMAL LOW (ref 8.9–10.3)
Chloride: 109 mmol/L (ref 101–111)
Creatinine, Ser: 0.91 mg/dL (ref 0.44–1.00)
GFR calc Af Amer: >60 mL/min (ref >60)
GFR calc non Af Amer: 59 mL/min — ABNORMAL LOW (ref >60)
Glucose, Bld: 144 mg/dL — ABNORMAL HIGH (ref 65–99)
Potassium: 4.2 mmol/L (ref 3.5–5.1)
Sodium: 136 mmol/L (ref 135–145)

## 2017-02-12 MED ORDER — FAT EMULSION 20 % IV EMUL
240.0000 mL | INTRAVENOUS | Status: AC
  Administered 2017-02-12: 240 mL via INTRAVENOUS
  Filled 2017-02-12: qty 250

## 2017-02-12 MED ORDER — TRACE MINERALS CR-CU-MN-SE-ZN 10-1000-500-60 MCG/ML IV SOLN
INTRAVENOUS | Status: AC
  Administered 2017-02-12: 18:00:00 via INTRAVENOUS
  Filled 2017-02-12: qty 1914

## 2017-02-12 NOTE — Progress Notes (Signed)
PHARMACY - ADULT TOTAL PARENTERAL NUTRITION CONSULT NOTE   Pharmacy Consult:  TPN Indication: Ileus  Patient Measurements: Height: 5\' 4"  (162.6 cm) Weight: 198 lb 3.1 oz (89.9 kg) IBW/kg (Calculated) : 54.7 TPN AdjBW (KG): 63.5 Body mass index is 34.02 kg/m.  Assessment:  46 YOF with severe malnutrition related to renal cell and pancreatic cancer who presented with abdominal mass on 01/12/17.  She is s/p Whipple procedure on the same day for pancreatic cancer.  She was discharged 02/05/17 and readmitted same day for vomiting at SNF. Pharmacy consulted to manage TPN for ileus that prevented oral intake and caused significant nausea and vomiting.   GI: prealbumin WNL at 19.3.  Erythromycin for gastroparesis, BM x1, NG inserted 6/27 and O/P 575mL, plan repeat contrast study 6/30 - PRN Zofran/Phenergan (QTc 459ms on 6/17), PPI PO.  *6/27 EGD: no acute finding, a lot of bilious fluid Endo: hypothyroid on Synthroid.  DM not on med PTA - CBGs 120-196 on TPN, 105-143 off TPN Insulin requirements in the past 24 hours: 23 units SSI + 50 units in TPN Lytes:  WNL Renal: SCr 0.91, BUN 27 - NS 20K at 50 ml/hr, UOP 1 ml/kg/hr Pulm: stable on RA Cards: HTN - BP elevated, HR controlled (meds not given yesterday) - amlodipine, irbesartan, Toprol AC: Lovenox for new DVT - dose appropriate, mild anemia, plts WNL Hepatobil: LFTs / tbili / TG WNL Neuro: hx CVA 2016 with residual L-hand and foot weakness ID: afebrile, WBC WNL - not on abx Best Practices: Lovenox  TPN Access: triple lumen PICC placed 6/11 - replaced 02/02/17 TPN start date: 01/23/17  Nutritional Goals (per RD recommendation on 6/23): 1800-1950 kCal, 82-93 g protein per day  Current Nutrition:  TPN    Plan:   - Cont Cyclic Clinimix E 0/10, infuse 1914 mls over 16 hours:  75 ml/hr x 1 hr, then 126 ml/hr x 14 hrs, then 75 ml/hr x 1 hr.  Watch CBGs closely. - 20% ILE at 20 ml/hr x 12 hrs - TPN provides 1839 kCal and 96 gm of protein per  day, meeting 100% of total needs - Daily multivitamin in TPN - Trace elements in TPN every other day d/t shortage, next due 6/29 - Continue resistant SSI Q4H (0000, 0500, 0900, 1100, 1500, 2000) + 50 units insulin in TPN.   - NS 20K at 50 ml/hr per MD - F/U tolerance to cyclic TPN    Gracy Bruins, B.S., PharmD [facility]

## 2017-02-12 NOTE — Progress Notes (Signed)
Patient ID: Kaylee Mullins, female   DOB: 11-23-1935, 81 y.o.   MRN: [id number]  Progress Note: General Surgery Service   Assessment/Plan: Patient Active Problem List   Diagnosis Date Noted  . Malnutrition of moderate degree 02/07/2017  . Gastroparesis 02/06/2017  . Adenocarcinoma ([place]) 01/12/2017  . 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 Essential hypertension 02/11/2016  . Lumbar stenosis with neurogenic claudication 02/10/2016  . Hyperlipidemia 02/10/2016  . Hypothyroidism 02/10/2016  . Musculoskeletal neck pain 12/13/2014  . Muscular pain 12/13/2014  . Numbness   . Acute CVA (cerebrovascular accident) ([place]) 09/16/2014  . Right renal mass 09/13/2014  . Cerebral infarction due to unspecified mechanism   . CVA (cerebral infarction) 09/11/2014  . Osteoarthritis 09/11/2014  . Postsurgical hypothyroidism 09/11/2014  . Uncontrolled hypertension 09/11/2014  . History of CVA (cerebral vascular accident) ([place]) 09/11/2014  . Diabetes mellitus without complication ([place]) 78/93/8101  . Dyslipidemia 04/20/2007   s/p  Whipple with complication of gastroparesis and persistent n/v -SIPS -continue TPN -up to chair and ambulate There is unobstructed outflow from stomach, but no motility of stomach.  Outflow seems gravity dependent.  Continue erythromycin. Social work consult working with family.     LOS: 6 days  Chief Complaint/Subjective: Earache from NGT.  No n/v with NGT.    Objective: Vital signs in last 24 hours: Temp:  [99 F (37.2 C)-99.5 F (37.5 C)] 99.5 F (37.5 C) (06/29 0515) Pulse Rate:  [72-83] 75 (06/29 0515) Resp:  [17-18] 17 (06/29 0515) BP: (159-181)/(54-71) 167/68 (06/29 0515) SpO2:  [98 %-100 %] 100 % (06/29 0515) Last BM Date: 02/06/17  Intake/Output from previous day: 06/28 0701 - 06/29 0700 In: 1548 [I.V.:1548] Out: 2600 [Urine:2050; Emesis/NG output:550] Intake/Output this shift: Total I/O In: -  Out: 400 [Urine:400]  Lungs: breathing  comfortably  Abd: soft, NT, minimally distended upper abdomen.  Incision c/d/i  Extremities: no edema  Neuro: AOx4  Lab Results: CBC  No results for input(s): WBC, HGB, HCT, PLT in the last 72 hours. BMET  Recent Labs  02/11/17 0407 02/12/17 0700  NA 139 136  K 4.0 4.2  CL 110 109  CO2 20* 22  GLUCOSE 188* 144*  BUN 21* 27*  CREATININE 0.86 0.91  CALCIUM 9.0 8.8*   PT/INR No results for input(s): LABPROT, INR in the last 72 hours. ABG No results for input(s): PHART, HCO3 in the last 72 hours.  Invalid input(s): PCO2, PO2  Studies/Results:  Anti-infectives: Anti-infectives    Start     Dose/Rate Route Frequency Ordered Stop   02/09/17 0600  erythromycin (EES) 400 MG/5ML suspension 200 mg     200 mg Oral Every 6 hours 02/09/17 0147     02/07/17 0000  erythromycin (EES) 400 MG/5ML suspension 400 mg  Status:  Discontinued     400 mg Oral Every 6 hours 02/07/17 1932 02/09/17 0147   02/06/17 0600  erythromycin ethylsuccinate (EES) 200 MG/5ML suspension 400 mg  Status:  Discontinued     400 mg Oral Every 6 hours 02/06/17 0324 02/07/17 1932      Medications: Scheduled Meds: . amLODipine  10 mg Per NG tube Daily  . chlorhexidine  15 mL Mouth Rinse BID  . enoxaparin  90 mg Subcutaneous Q12H  . erythromycin  200 mg Oral Q6H  . insulin aspart  0-20 Units Subcutaneous 6 times per day  . irbesartan  300 mg Per NG tube Daily  . levothyroxine  200 mcg Per NG tube QAC breakfast  .  mouth rinse  15 mL Mouth Rinse q12n4p  . metoprolol succinate  100 mg Oral Daily  . nystatin  5 mL Oral QID  . pantoprazole (PROTONIX) IV  40 mg Intravenous Q24H   Continuous Infusions: . Marland KitchenTPN (CLINIMIX-E) Adult 126 mL/hr at 02/11/17 1846  . 0.9 % NaCl with KCl 20 mEq / L 50 mL/hr at 02/12/17 0818   PRN Meds:.acetaminophen **OR** acetaminophen, albuterol, HYDROmorphone (DILAUDID) injection, NIFEdipine, ondansetron **OR** ondansetron (ZOFRAN) IV, oxyCODONE, phenol, promethazine, sodium  chloride flush  Aneisha Skyles, MD  [facility]

## 2017-02-13 LAB — GLUCOSE, CAPILLARY
Glucose-Capillary: 168 mg/dL — ABNORMAL HIGH (ref 65–99)
Glucose-Capillary: 155 mg/dL — ABNORMAL HIGH (ref 65–99)
Glucose-Capillary: 168 mg/dL — ABNORMAL HIGH (ref 65–99)
Glucose-Capillary: 107 mg/dL — ABNORMAL HIGH (ref 65–99)
Glucose-Capillary: 194 mg/dL — ABNORMAL HIGH (ref 65–99)

## 2017-02-13 LAB — BASIC METABOLIC PANEL
Anion gap: 7 (ref 5–15)
BUN: 19 mg/dL (ref 6–20)
CO2: 23 mmol/L (ref 22–32)
Calcium: 8.8 mg/dL — ABNORMAL LOW (ref 8.9–10.3)
Chloride: 106 mmol/L (ref 101–111)
Creatinine, Ser: 0.81 mg/dL (ref 0.44–1.00)
GFR calc Af Amer: >60 mL/min (ref >60)
GFR calc non Af Amer: >60 mL/min (ref >60)
Glucose, Bld: 175 mg/dL — ABNORMAL HIGH (ref 65–99)
Potassium: 4.2 mmol/L (ref 3.5–5.1)
Sodium: 136 mmol/L (ref 135–145)

## 2017-02-13 LAB — CBC
HCT: 29.2 % — ABNORMAL LOW (ref 36.0–46.0)
Hemoglobin: 9.4 g/dL — ABNORMAL LOW (ref 12.0–15.0)
MCH: 30.6 pg (ref 26.0–34.0)
MCHC: 32.2 g/dL (ref 30.0–36.0)
MCV: 95.1 fL (ref 78.0–100.0)
Platelets: 184 10*3/uL (ref 150–400)
RBC: 3.07 MIL/uL — ABNORMAL LOW (ref 3.87–5.11)
RDW: 13.8 % (ref 11.5–15.5)
WBC: 7.7 10*3/uL (ref 4.0–10.5)

## 2017-02-13 MED ORDER — M.V.I. ADULT IV INJ
INTRAVENOUS | Status: AC
  Administered 2017-02-13: 18:00:00 via INTRAVENOUS
  Filled 2017-02-13: qty 1914

## 2017-02-13 MED ORDER — FAT EMULSION 20 % IV EMUL
240.0000 mL | INTRAVENOUS | Status: AC
  Administered 2017-02-13: 240 mL via INTRAVENOUS
  Filled 2017-02-13: qty 250

## 2017-02-13 NOTE — Progress Notes (Signed)
PHARMACY - ADULT TOTAL PARENTERAL NUTRITION CONSULT NOTE   Pharmacy Consult:  TPN Indication: Ileus  Patient Measurements: Height: 5\' 4"  (162.6 cm) Weight: 214 lb 15.2 oz (97.5 kg) IBW/kg (Calculated) : 54.7 TPN AdjBW (KG): 63.5 Body mass index is 36.9 kg/m.  Assessment:  57 YOF with severe malnutrition related to renal cell and pancreatic cancer who presented with abdominal mass on 01/12/17.  She is s/p Whipple procedure on the same day for pancreatic cancer.  She was discharged 02/05/17 and readmitted same day for vomiting at SNF. Pharmacy consulted to manage TPN for ileus that prevented oral intake and caused significant nausea and vomiting.   GI: prealbumin WNL at 19.3.  Erythromycin for gastroparesis, BM x1, NG inserted 6/27 and O/P 579mL.  Repeat GES- no obstruction, no motilitiy of stomach - PRN Zofran/Phenergan (QTc 456ms on 6/17), Erythromycin, PPI PO.  *6/27 EGD: no acute finding, a lot of bilious fluid Endo: hypothyroid on Synthroid.  DM not on med PTA - CBGs 155-188 on TPN, 162 off TPN Insulin requirements in the past 24 hours: 19 units SSI + 50 units in TPN Lytes:  WNL Renal: SCr 0.91, BUN 27 - NS 20K at 50 ml/hr, UOP 1 ml/kg/hr Pulm: stable on RA Cards: HTN - BP elevated, HR controlled (meds not given yesterday) - amlodipine, irbesartan, Toprol AC: Lovenox for new DVT - dose appropriate, mild anemia, plts WNL Hepatobil: LFTs / tbili / TG WNL Neuro: hx CVA 2016 with residual L-hand and foot weakness ID: afebrile, WBC WNL - not on abx Best Practices: Lovenox  TPN Access: triple lumen PICC placed 6/11 - replaced 02/02/17 TPN start date: 01/23/17  Nutritional Goals (per RD recommendation on 6/23): 1800-1950 kCal, 82-93 g protein per day  Current Nutrition:  TPN    Plan:   - Cont Cyclic Clinimix E 2/13, infuse 1914 mls over 16 hours:  75 ml/hr x 1 hr, then 126 ml/hr x 14 hrs, then 75 ml/hr x 1 hr.  Watch CBGs closely. - 20% ILE at 20 ml/hr x 12 hrs - TPN provides 1839  kCal and 96 gm of protein per day, meeting 100% of total needs - Daily multivitamin in TPN - Trace elements in TPN every other day d/t shortage, next due 7/1 - Add insulin 50 units to TPN - Continue resistant SSI Q4H (0000, 0500, 0900, 1100, 1500, 2000)   - NS 20K at 50 ml/hr per MD - F/U tolerance to cyclic TPN    Gracy Bruins, B.S., PharmD Clinical Pharmacist [facility]

## 2017-02-13 NOTE — Progress Notes (Signed)
3 Days Post-Op  Subjective: Alert.  Stable.  No complaints.  Denies abdominal pain.  Passing a little flatus and small stools. CBC and be met look fine.  Glucose 175.  Objective: Vital signs in last 24 hours: Temp:  [99.3 F (37.4 C)-99.5 F (37.5 C)] 99.5 F (37.5 C) (06/30 0502) Pulse Rate:  [71-82] 81 (06/30 0502) Resp:  [17-18] 18 (06/30 0502) BP: (160-168)/(48-57) 168/57 (06/30 0502) SpO2:  [98 %-100 %] 100 % (06/30 0502) Last BM Date: 01/12/17  Intake/Output from previous day: 06/29 0701 - 06/30 0700 In: 3560.7 [I.V.:3560.7] Out: 3900 [Urine:3250; Emesis/NG output:650] Intake/Output this shift: No intake/output data recorded.  General appearance: Alert.  No distress.  Quite pleasant.  Sitting up at edge of bed. Resp: clear to auscultation bilaterally GI: Soft.  Nontender.  Nondistended.  Midline wound well healed. Extremities: no edema, redness or tenderness in the calves or thighs  Lab Results:  Results for orders placed or performed during the hospital encounter of 02/05/17 (from the past 24 hour(s))  Glucose, capillary     Status: Abnormal   Collection Time: 02/12/17  9:06 AM  Result Value Ref Range   Glucose-Capillary 120 (H) 65 - 99 mg/dL  Glucose, capillary     Status: Abnormal   Collection Time: 02/12/17 12:33 PM  Result Value Ref Range   Glucose-Capillary 162 (H) 65 - 99 mg/dL  Glucose, capillary     Status: Abnormal   Collection Time: 02/12/17  8:21 PM  Result Value Ref Range   Glucose-Capillary 188 (H) 65 - 99 mg/dL  Glucose, capillary     Status: Abnormal   Collection Time: 02/13/17 12:01 AM  Result Value Ref Range   Glucose-Capillary 163 (H) 65 - 99 mg/dL  Basic metabolic panel     Status: Abnormal   Collection Time: 02/13/17  5:00 AM  Result Value Ref Range   Sodium 136 135 - 145 mmol/L   Potassium 4.2 3.5 - 5.1 mmol/L   Chloride 106 101 - 111 mmol/L   CO2 23 22 - 32 mmol/L   Glucose, Bld 175 (H) 65 - 99 mg/dL   BUN 19 6 - 20 mg/dL    Creatinine, Ser 0.81 0.44 - 1.00 mg/dL   Calcium 8.8 (L) 8.9 - 10.3 mg/dL   GFR calc non Af Amer >60 >60 mL/min   GFR calc Af Amer >60 >60 mL/min   Anion gap 7 5 - 15  CBC     Status: Abnormal   Collection Time: 02/13/17  5:00 AM  Result Value Ref Range   WBC 7.7 4.0 - 10.5 K/uL   RBC 3.07 (L) 3.87 - 5.11 MIL/uL   Hemoglobin 9.4 (L) 12.0 - 15.0 g/dL   HCT 29.2 (L) 36.0 - 46.0 %   MCV 95.1 78.0 - 100.0 fL   MCH 30.6 26.0 - 34.0 pg   MCHC 32.2 30.0 - 36.0 g/dL   RDW 13.8 11.5 - 15.5 %   Platelets 184 150 - 400 K/uL  Glucose, capillary     Status: Abnormal   Collection Time: 02/13/17  5:07 AM  Result Value Ref Range   Glucose-Capillary 168 (H) 65 - 99 mg/dL  Glucose, capillary     Status: Abnormal   Collection Time: 02/13/17  8:04 AM  Result Value Ref Range   Glucose-Capillary 155 (H) 65 - 99 mg/dL     Studies/Results: Dg Ugi  W/kub  Result Date: 02/12/2017 CLINICAL DATA:  Delayed gastric emptying after Whipple procedure. EXAM: UPPER GI  SERIES WITH KUB TECHNIQUE: After obtaining a scout radiograph a routine upper GI series was performed using thin density barium. FLUOROSCOPY TIME:  Fluoroscopy Time:  3 minutes 18 seconds COMPARISON:  Upper GI dated 02/03/2017 and KUB dated 02/10/2017 FINDINGS: The KUB demonstrates contrast in the colon, essentially unchanged since the prior study of 02/10/2017. There are no dilated loops of large or small bowel. Pancreatic stent and NG tube are in place. We injected thin density barium through the indwelling NG tube into the stomach. There is no peristalsis in the stomach. The patient was placed in the right lateral decubitus position for 20 minutes and at that time there was passage of contrast into the gastrojejunostomy with no evidence of stricture or mass. However, there is no significant peristalsis seen in the stomach or jejunum during this exam. The patient had spontaneous prominent gastroesophageal reflux to the level of the thoracic inlet.  IMPRESSION: 1. Gastroparesis. Gravity drainage of the stomach in the right lateral decubitus position only. 2. Prominent gastroesophageal reflux to the level of the thoracic inlet. 3. Contrast remains in the nondilated colon, essentially unchanged since 02/10/2017 consistent with ileus. Electronically Signed   By: Lorriane Shire M.D.   On: 02/12/2017 11:43    . amLODipine  10 mg Per NG tube Daily  . chlorhexidine  15 mL Mouth Rinse BID  . enoxaparin  90 mg Subcutaneous Q12H  . erythromycin  200 mg Oral Q6H  . insulin aspart  0-20 Units Subcutaneous 6 times per day  . irbesartan  300 mg Per NG tube Daily  . levothyroxine  200 mcg Per NG tube QAC breakfast  . mouth rinse  15 mL Mouth Rinse q12n4p  . metoprolol succinate  100 mg Oral Daily  . nystatin  5 mL Oral QID  . pantoprazole (PROTONIX) IV  40 mg Intravenous Q24H     Assessment/Plan: s/p Procedure(s): ESOPHAGOGASTRODUODENOSCOPY (EGD)   Patient Active Problem List   Diagnosis Date Noted  . Malnutrition of moderate degree 02/07/2017  . Gastroparesis 02/06/2017  . Adenocarcinoma ([place]) 01/12/2017  . 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 Essential hypertension 02/11/2016  . Lumbar stenosis with neurogenic claudication 02/10/2016  . Hyperlipidemia 02/10/2016  . Hypothyroidism 02/10/2016  . Musculoskeletal neck pain 12/13/2014  . Muscular pain 12/13/2014  . Numbness   . Acute CVA (cerebrovascular accident) ([place]) 09/16/2014  . Right renal mass 09/13/2014  . Cerebral infarction due to unspecified mechanism   . CVA (cerebral infarction) 09/11/2014  . Osteoarthritis 09/11/2014  . Postsurgical hypothyroidism 09/11/2014  . Uncontrolled hypertension 09/11/2014  . History of CVA (cerebral vascular accident) ([place]) 09/11/2014  . Diabetes mellitus without complication ([place]) 55/73/2202  . Dyslipidemia 04/20/2007   s/p  Whipple with complication of gastroparesis and persistent n/v -SIPS -continue TPN -up to chair and  ambulate There is unobstructed outflow from stomach, but no motility of stomach.  Outflow seems gravity dependent.  Continue erythromycin. -No changes to care plan today. Social work consult working with family Dr. [name] is contemplating  IR placed G-tube.  I did not discuss this with the patient   _0 @  LOS: 7 days    Lenn Volker M 02/13/2017  . .prob

## 2017-02-14 LAB — GLUCOSE, CAPILLARY
Glucose-Capillary: 124 mg/dL — ABNORMAL HIGH (ref 65–99)
Glucose-Capillary: 153 mg/dL — ABNORMAL HIGH (ref 65–99)
Glucose-Capillary: 156 mg/dL — ABNORMAL HIGH (ref 65–99)
Glucose-Capillary: 169 mg/dL — ABNORMAL HIGH (ref 65–99)
Glucose-Capillary: 201 mg/dL — ABNORMAL HIGH (ref 65–99)
Glucose-Capillary: 228 mg/dL — ABNORMAL HIGH (ref 65–99)

## 2017-02-14 MED ORDER — FAT EMULSION 20 % IV EMUL
240.0000 mL | INTRAVENOUS | Status: AC
  Administered 2017-02-14: 240 mL via INTRAVENOUS
  Filled 2017-02-14: qty 250

## 2017-02-14 MED ORDER — TRACE MINERALS CR-CU-MN-SE-ZN 10-1000-500-60 MCG/ML IV SOLN
INTRAVENOUS | Status: AC
  Administered 2017-02-14: 18:00:00 via INTRAVENOUS
  Filled 2017-02-14: qty 1914

## 2017-02-14 NOTE — Progress Notes (Signed)
4 Days Post-Op  Subjective: Remains stable, alert, unchanged.  Denies abdominal pain.  No flatus.  One tiny stool 48 hours ago. Remains on TNA. NG output 7 50 mL over 24 hours.  Good urine output.  Objective: Vital signs in last 24 hours: Temp:  [98.7 F (37.1 C)-99.1 F (37.3 C)] 98.7 F (37.1 C) (07/01 0439) Pulse Rate:  [71-83] 74 (07/01 0439) Resp:  [20] 20 (07/01 0439) BP: (165-175)/(51-78) 168/78 (07/01 0439) SpO2:  [98 %-99 %] 98 % (07/01 0439) Last BM Date: 02/13/17  Intake/Output from previous day: 06/30 0701 - 07/01 0700 In: 648.8 [I.V.:648.8] Out: 1276 [Urine:526; Emesis/NG output:750] Intake/Output this shift: Total I/O In: -  Out: 400 [Urine:400]    EXAM: General appearance: Alert.  No distress.  Quite pleasant.  Sitting up at edge of bed. Resp: clear to auscultation bilaterally GI: Soft.  Nontender.  Nondistended.  Midline wound well healed. Extremities: no edema, redness or tenderness in the calves or thighs   Lab Results:  Results for orders placed or performed during the hospital encounter of 02/05/17 (from the past 24 hour(s))  Glucose, capillary     Status: Abnormal   Collection Time: 02/13/17 12:54 PM  Result Value Ref Range   Glucose-Capillary 168 (H) 65 - 99 mg/dL  Glucose, capillary     Status: Abnormal   Collection Time: 02/13/17  5:11 PM  Result Value Ref Range   Glucose-Capillary 107 (H) 65 - 99 mg/dL  Glucose, capillary     Status: Abnormal   Collection Time: 02/13/17  8:34 PM  Result Value Ref Range   Glucose-Capillary 194 (H) 65 - 99 mg/dL  Glucose, capillary     Status: Abnormal   Collection Time: 02/14/17 12:25 AM  Result Value Ref Range   Glucose-Capillary 228 (H) 65 - 99 mg/dL  Glucose, capillary     Status: Abnormal   Collection Time: 02/14/17  4:35 AM  Result Value Ref Range   Glucose-Capillary 153 (H) 65 - 99 mg/dL  Glucose, capillary     Status: Abnormal   Collection Time: 02/14/17  7:35 AM  Result Value Ref Range   Glucose-Capillary 169 (H) 65 - 99 mg/dL   Comment 1 Notify RN      Studies/Results: No results found.  Marland Kitchen amLODipine  10 mg Per NG tube Daily  . chlorhexidine  15 mL Mouth Rinse BID  . enoxaparin  90 mg Subcutaneous Q12H  . erythromycin  200 mg Oral Q6H  . insulin aspart  0-20 Units Subcutaneous 6 times per day  . irbesartan  300 mg Per NG tube Daily  . levothyroxine  200 mcg Per NG tube QAC breakfast  . mouth rinse  15 mL Mouth Rinse q12n4p  . metoprolol succinate  100 mg Oral Daily  . nystatin  5 mL Oral QID  . pantoprazole (PROTONIX) IV  40 mg Intravenous Q24H     Assessment/Plan: s/p Procedure(s): ESOPHAGOGASTRODUODENOSCOPY (EGD)   Patient Active Problem List   Diagnosis Date Noted  . Malnutrition of moderate degree 02/07/2017  . Gastroparesis 02/06/2017  . Adenocarcinoma ([place]) 01/12/2017  . 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 Essential hypertension 02/11/2016  . Lumbar stenosis with neurogenic claudication 02/10/2016  . Hyperlipidemia 02/10/2016  . Hypothyroidism 02/10/2016  . Musculoskeletal neck pain 12/13/2014  . Muscular pain 12/13/2014  . Numbness   . Acute CVA (cerebrovascular accident) ([place]) 09/16/2014  . Right renal mass 09/13/2014  . Cerebral infarction due to unspecified mechanism   . CVA (cerebral infarction)  09/11/2014  . Osteoarthritis 09/11/2014  . Postsurgical hypothyroidism 09/11/2014  . Uncontrolled hypertension 09/11/2014  . History of CVA (cerebral vascular accident) ([place]) 09/11/2014  . Diabetes mellitus without complication ([place]) 37/16/9678  . Dyslipidemia 04/20/2007   s/p Whipple with complication of gastroparesis and persistent n/v -SIPS -continue TPN -up to chair and ambulate There is unobstructed outflow from stomach, but no motility of stomach. Outflow seems gravity dependent. Continue erythromycin. -No changes to care plan today. Social work consult working with family -Ultimately plan SNF, but we will need to convert   NG tube to either GJ tube or J-tube for nutritional support -The patient is aware that we will need to come up with a solution to her NG tube dependence.. -Dr. [name] is contemplating  IR placed G-or-GJ-tube.     @PROBHOSP @  LOS: 8 days    Izyan Ezzell M 02/14/2017  . .prob

## 2017-02-14 NOTE — Progress Notes (Signed)
PHARMACY - ADULT TOTAL PARENTERAL NUTRITION CONSULT NOTE   Pharmacy Consult:  TPN Indication: Ileus  Patient Measurements: Height: 5\' 4"  (162.6 cm) Weight: 214 lb 15.2 oz (97.5 kg) IBW/kg (Calculated) : 54.7 TPN AdjBW (KG): 63.5 Body mass index is 36.9 kg/m.  Assessment:  45 YOF with severe malnutrition related to renal cell and pancreatic cancer who presented with abdominal mass on 01/12/17.  She is s/p Whipple procedure on the same day for pancreatic cancer.  She was discharged 02/05/17 and readmitted same day for vomiting at SNF. Pharmacy consulted to manage TPN for ileus that prevented oral intake and caused significant nausea and vomiting.   GI: prealbumin WNL at 19.3.  Erythromycin for gastroparesis, BM x1, NG inserted 6/27 and O/P 742mL.  Needs solution for G-tube dependence.  Repeat GES- no obstruction, no motility of stomach - PRN Zofran/Phenergan (QTc 446ms on 6/17), Erythromycin, PPI PO.  *6/27 EGD: no acute finding, a lot of bilious fluid Endo: hypothyroid on Synthroid.  DM not on med PTA - CBGs 153-228 on TPN (not covered for 194 at 8PM & cbg up to 228 at [place]), 107 & 156 off TPN Insulin requirements in the past 24 hours: 11 units SSI + 50 units in TPN Lytes:  WNL.  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  Watch CBGs closely. - 20% ILE at 20 ml/hr x 12 hrs - TPN provides 1839 kCal and 96 gm of protein per day, meeting 100% of total needs - Daily multivitamin in TPN - Trace elements in TPN every other day d/t shortage, next due 7/1 - Add insulin 50 units to TPN - Continue resistant SSI Q4H (0000, 0500, 0900, 1100, 1500, 2000)   - NS 20K at 50 ml/hr per MD - F/U tolerance to cyclic TPN    Gracy Bruins, B.S., PharmD Clinical Pharmacist [facility]

## 2017-02-15 LAB — COMPREHENSIVE METABOLIC PANEL
ALT: 23 U/L (ref 14–54)
AST: 28 U/L (ref 15–41)
Albumin: 3.2 g/dL — ABNORMAL LOW (ref 3.5–5.0)
Alkaline Phosphatase: 70 U/L (ref 38–126)
Anion gap: 6 (ref 5–15)
BUN: 22 mg/dL — ABNORMAL HIGH (ref 6–20)
CO2: 23 mmol/L (ref 22–32)
Calcium: 8.7 mg/dL — ABNORMAL LOW (ref 8.9–10.3)
Chloride: 107 mmol/L (ref 101–111)
Creatinine, Ser: 0.77 mg/dL (ref 0.44–1.00)
GFR calc Af Amer: >60 mL/min (ref >60)
GFR calc non Af Amer: >60 mL/min (ref >60)
Glucose, Bld: 140 mg/dL — ABNORMAL HIGH (ref 65–99)
Potassium: 4.2 mmol/L (ref 3.5–5.1)
Sodium: 136 mmol/L (ref 135–145)
Total Bilirubin: 0.5 mg/dL (ref 0.3–1.2)
Total Protein: 6.6 g/dL (ref 6.5–8.1)

## 2017-02-15 LAB — GLUCOSE, CAPILLARY
Glucose-Capillary: 109 mg/dL — ABNORMAL HIGH (ref 65–99)
Glucose-Capillary: 134 mg/dL — ABNORMAL HIGH (ref 65–99)
Glucose-Capillary: 150 mg/dL — ABNORMAL HIGH (ref 65–99)
Glucose-Capillary: 165 mg/dL — ABNORMAL HIGH (ref 65–99)
Glucose-Capillary: 166 mg/dL — ABNORMAL HIGH (ref 65–99)
Glucose-Capillary: 208 mg/dL — ABNORMAL HIGH (ref 65–99)

## 2017-02-15 LAB — TRIGLYCERIDES: Triglycerides: 74 mg/dL (ref <150)

## 2017-02-15 LAB — CBC
HCT: 29.7 % — ABNORMAL LOW (ref 36.0–46.0)
Hemoglobin: 9.4 g/dL — ABNORMAL LOW (ref 12.0–15.0)
MCH: 30.4 pg (ref 26.0–34.0)
MCHC: 31.6 g/dL (ref 30.0–36.0)
MCV: 96.1 fL (ref 78.0–100.0)
Platelets: 227 10*3/uL (ref 150–400)
RBC: 3.09 MIL/uL — ABNORMAL LOW (ref 3.87–5.11)
RDW: 14.1 % (ref 11.5–15.5)
WBC: 8.3 10*3/uL (ref 4.0–10.5)

## 2017-02-15 LAB — DIFFERENTIAL
Basophils Absolute: 0 10*3/uL (ref 0.0–0.1)
Basophils Relative: 0 %
Eosinophils Absolute: 0.3 10*3/uL (ref 0.0–0.7)
Eosinophils Relative: 3 %
Lymphocytes Relative: 31 %
Lymphs Abs: 2.6 10*3/uL (ref 0.7–4.0)
Monocytes Absolute: 0.8 10*3/uL (ref 0.1–1.0)
Monocytes Relative: 9 %
Neutro Abs: 4.7 10*3/uL (ref 1.7–7.7)
Neutrophils Relative %: 57 %

## 2017-02-15 LAB — PHOSPHORUS: Phosphorus: 3.6 mg/dL (ref 2.5–4.6)

## 2017-02-15 LAB — MAGNESIUM: Magnesium: 1.7 mg/dL (ref 1.7–2.4)

## 2017-02-15 LAB — PREALBUMIN: Prealbumin: 14.9 mg/dL — ABNORMAL LOW (ref 18–38)

## 2017-02-15 MED ORDER — ENOXAPARIN SODIUM 100 MG/ML ~~LOC~~ SOLN
90.0000 mg | Freq: Two times a day (BID) | SUBCUTANEOUS | Status: DC
  Administered 2017-02-17 – 2017-03-01 (×25): 90 mg via SUBCUTANEOUS
  Filled 2017-02-15 (×25): qty 1

## 2017-02-15 MED ORDER — MAGNESIUM SULFATE 2 GM/50ML IV SOLN
2.0000 g | Freq: Once | INTRAVENOUS | Status: AC
  Administered 2017-02-15: 2 g via INTRAVENOUS
  Filled 2017-02-15: qty 50

## 2017-02-15 MED ORDER — FAT EMULSION 20 % IV EMUL
240.0000 mL | INTRAVENOUS | Status: AC
  Administered 2017-02-15: 240 mL via INTRAVENOUS
  Filled 2017-02-15: qty 250

## 2017-02-15 MED ORDER — M.V.I. ADULT IV INJ
INTRAVENOUS | Status: AC
  Administered 2017-02-15: 17:00:00 via INTRAVENOUS
  Filled 2017-02-15: qty 1914

## 2017-02-15 NOTE — Progress Notes (Signed)
5 Days Post-Op  Subjective: No acute events.  Continues to have high NGT output, but nausea is controlled. Pt trying to stay laying toward right for postural drainage of stomach.  Objective: Vital signs in last 24 hours: Temp:  [97.7 F (36.5 C)-99.5 F (37.5 C)] 99.5 F (37.5 C) (07/02 1304) Pulse Rate:  [68-80] 80 (07/02 1304) Resp:  [18] 18 (07/02 1304) BP: (147-167)/(39-68) 154/39 (07/02 1304) SpO2:  [100 %] 100 % (07/02 1304) Last BM Date: 02/13/17  Intake/Output from previous day: 07/01 0701 - 07/02 0700 In: 3132.8 [I.V.:3092.8; NG/GT:40] Out: 2100 [Urine:1600; Emesis/NG output:500] Intake/Output this shift: Total I/O In: 577.5 [I.V.:577.5] Out: 1000 [Urine:800; Emesis/NG output:200]    EXAM: General appearance: Alert.  No distress.  Quite pleasant.  Sitting up at edge of bed. Resp: breathing comfortably GI: Soft.  Nontender.  Nondistended.  Midline wound well healed. Extremities: no edema, redness or tenderness in the calves or thighs   Lab Results:  Results for orders placed or performed during the hospital encounter of 02/05/17 (from the past 24 hour(s))  Glucose, capillary     Status: Abnormal   Collection Time: 02/14/17  8:11 PM  Result Value Ref Range   Glucose-Capillary 201 (H) 65 - 99 mg/dL  Glucose, capillary     Status: Abnormal   Collection Time: 02/15/17 12:15 AM  Result Value Ref Range   Glucose-Capillary 166 (H) 65 - 99 mg/dL  Glucose, capillary     Status: Abnormal   Collection Time: 02/15/17  4:27 AM  Result Value Ref Range   Glucose-Capillary 134 (H) 65 - 99 mg/dL  Comprehensive metabolic panel     Status: Abnormal   Collection Time: 02/15/17  4:42 AM  Result Value Ref Range   Sodium 136 135 - 145 mmol/L   Potassium 4.2 3.5 - 5.1 mmol/L   Chloride 107 101 - 111 mmol/L   CO2 23 22 - 32 mmol/L   Glucose, Bld 140 (H) 65 - 99 mg/dL   BUN 22 (H) 6 - 20 mg/dL   Creatinine, Ser 0.77 0.44 - 1.00 mg/dL   Calcium 8.7 (L) 8.9 - 10.3 mg/dL   Total  Protein 6.6 6.5 - 8.1 g/dL   Albumin 3.2 (L) 3.5 - 5.0 g/dL   AST 28 15 - 41 U/L   ALT 23 14 - 54 U/L   Alkaline Phosphatase 70 38 - 126 U/L   Total Bilirubin 0.5 0.3 - 1.2 mg/dL   GFR calc non Af Amer >60 >60 mL/min   GFR calc Af Amer >60 >60 mL/min   Anion gap 6 5 - 15  Magnesium     Status: None   Collection Time: 02/15/17  4:42 AM  Result Value Ref Range   Magnesium 1.7 1.7 - 2.4 mg/dL  Phosphorus     Status: None   Collection Time: 02/15/17  4:42 AM  Result Value Ref Range   Phosphorus 3.6 2.5 - 4.6 mg/dL  CBC     Status: Abnormal   Collection Time: 02/15/17  4:42 AM  Result Value Ref Range   WBC 8.3 4.0 - 10.5 K/uL   RBC 3.09 (L) 3.87 - 5.11 MIL/uL   Hemoglobin 9.4 (L) 12.0 - 15.0 g/dL   HCT 29.7 (L) 36.0 - 46.0 %   MCV 96.1 78.0 - 100.0 fL   MCH 30.4 26.0 - 34.0 pg   MCHC 31.6 30.0 - 36.0 g/dL   RDW 14.1 11.5 - 15.5 %   Platelets 227 150 - 400  K/uL  Differential     Status: None   Collection Time: 02/15/17  4:42 AM  Result Value Ref Range   Neutrophils Relative % 57 %   Neutro Abs 4.7 1.7 - 7.7 K/uL   Lymphocytes Relative 31 %   Lymphs Abs 2.6 0.7 - 4.0 K/uL   Monocytes Relative 9 %   Monocytes Absolute 0.8 0.1 - 1.0 K/uL   Eosinophils Relative 3 %   Eosinophils Absolute 0.3 0.0 - 0.7 K/uL   Basophils Relative 0 %   Basophils Absolute 0.0 0.0 - 0.1 K/uL  Prealbumin     Status: Abnormal   Collection Time: 02/15/17  4:42 AM  Result Value Ref Range   Prealbumin 14.9 (L) 18 - 38 mg/dL  Triglycerides     Status: None   Collection Time: 02/15/17  4:42 AM  Result Value Ref Range   Triglycerides 74 <150 mg/dL  Glucose, capillary     Status: Abnormal   Collection Time: 02/15/17  7:46 AM  Result Value Ref Range   Glucose-Capillary 150 (H) 65 - 99 mg/dL  Glucose, capillary     Status: Abnormal   Collection Time: 02/15/17 11:52 AM  Result Value Ref Range   Glucose-Capillary 165 (H) 65 - 99 mg/dL  Glucose, capillary     Status: Abnormal   Collection Time: 02/15/17   4:08 PM  Result Value Ref Range   Glucose-Capillary 109 (H) 65 - 99 mg/dL     Studies/Results: No results found.  Marland Kitchen amLODipine  10 mg Per NG tube Daily  . chlorhexidine  15 mL Mouth Rinse BID  . [START ON 02/17/2017] enoxaparin  90 mg Subcutaneous Q12H  . erythromycin  200 mg Oral Q6H  . insulin aspart  0-20 Units Subcutaneous 6 times per day  . irbesartan  300 mg Per NG tube Daily  . levothyroxine  200 mcg Per NG tube QAC breakfast  . mouth rinse  15 mL Mouth Rinse q12n4p  . metoprolol succinate  100 mg Oral Daily  . nystatin  5 mL Oral QID  . pantoprazole (PROTONIX) IV  40 mg Intravenous Q24H     Assessment/Plan: s/p Procedure(s): ESOPHAGOGASTRODUODENOSCOPY (EGD)   Patient Active Problem List   Diagnosis Date Noted  . Malnutrition of moderate degree 02/07/2017  . Gastroparesis 02/06/2017  . Adenocarcinoma ([place]) 01/12/2017  . 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 Essential hypertension 02/11/2016  . Lumbar stenosis with neurogenic claudication 02/10/2016  . Hyperlipidemia 02/10/2016  . Hypothyroidism 02/10/2016  . Musculoskeletal neck pain 12/13/2014  . Muscular pain 12/13/2014  . Numbness   . Acute CVA (cerebrovascular accident) ([place]) 09/16/2014  . Right renal mass 09/13/2014  . Cerebral infarction due to unspecified mechanism   . CVA (cerebral infarction) 09/11/2014  . Osteoarthritis 09/11/2014  . Postsurgical hypothyroidism 09/11/2014  . Uncontrolled hypertension 09/11/2014  . History of CVA (cerebral vascular accident) ([place]) 09/11/2014  . Diabetes mellitus without complication ([place]) 95/62/1308  . Dyslipidemia 04/20/2007   s/p Whipple with complication of gastroparesis and persistent n/v -SIPS -continue TPN -up to chair and ambulate There is unobstructed outflow from stomach, but no motility of stomach. Outflow seems gravity dependent. Continue erythromycin. Social work consult working with family -Ultimately plan SNF, but we will need to convert   NG tube to either GJ tube or J-tube for nutritional support -The patient is aware that we will need to come up with a solution to her NG tube dependence.. Discussed G tube with IR.  Will likely need G  tube for drainage and possibly convert to [place] tube. -  @PROBHOSP @  LOS: 9 days    Julienne Vogler 02/15/2017  . .probPatient ID: Kaylee Mullins, female   DOB: [birth year], 81 y.o.   MRN: [id number]

## 2017-02-15 NOTE — Progress Notes (Signed)
PHARMACY - ADULT TOTAL PARENTERAL NUTRITION CONSULT NOTE   Pharmacy Consult:  TPN Indication: Ileus  Patient Measurements: Height: 5\' 4"  (162.6 cm) Weight: 214 lb 15.2 oz (97.5 kg) IBW/kg (Calculated) : 54.7 TPN AdjBW (KG): 63.5 Body mass index is 36.9 kg/m.  Assessment:  55 YOF with severe malnutrition related to renal cell and pancreatic cancer who presented with abdominal mass on 01/12/17.  She is s/p Whipple procedure on the same day for pancreatic cancer.  She was discharged 02/05/17 and readmitted same day for vomiting at SNF. Pharmacy consulted to manage TPN for ileus that prevented oral intake and caused significant nausea and vomiting.   GI: On TPN for prolonged ileus. Needs solution for G-tube dependence. Surgery may place [place] or J tube soon. NG output 781ml yesterday. Erythromycin for gastroparesis. EGD on 6/27 showed no acute findings, a lot of bilious fluid. Repeat GES- no obstruction, no motility of stomach. Albumin ok at 3.2, prealbumin 14.9. PRN Zofran/Phenergan (QTc 449ms on 6/17) Endo: hypothyroid on Synthroid.  DM not on med PTA. CBGs mostly controlled (140-200s) on TPN and controlled (120-150s) off TPN. Insulin requirements in the past 24 hours: 21 units SSI + 50 units in TPN Lytes:  wnl today exc Mg 1.7 (goal > 2 with ileus) Renal: SCr stable, CrCl ~58ml/min. UOP ok at 0.45ml/kg/hr. BUN 22. NS with 20K at 50 ml/hr. Pulm: stable on RA Cards: HTN. BP elevated, HR controlled- amlodipine, irbesartan, Toprol, nifedipine xl prn sbp > 170 AC: Lovenox for new DVT - dose appropriate, mild anemia, plts WNL Hepatobil: LFTs and Tbili wnl. Trig 74. Neuro: hx CVA 2016 with residual L-hand and foot weakness ID: afebrile, WBC WNL - not on abx Best Practices: Lovenox  TPN Access: triple lumen PICC placed 6/11 - replaced 02/02/17 TPN start date: 01/23/17  Nutritional Goals (per RD recommendation on 6/27): 1800-1950 kCal 82-93 g protein per day  Current Nutrition:  TPN + IV lipid  emulsion  Plan:   Continue cyclic Clinimix E 6/55. Infuse 1914 mL over 16 hrs: 75 mL/hr x 1 hr, then 126 mL/hr x 14 hrs, then 50 mL/hr x 1 hr. Advance cycle once CBGs and electrolytes well controlled Continue 20% lipid emulsion at 20 ml/hr over 12 hrs Continue NS with 87mEq of KCl at 50 ml/hr TPN and IV lipid emulsion provides 96 g of protein and 1839 kCals per day meeting 100% of protein and kCal needs Continue MVI in TPN Continue TE in TPN every other day, next 7/3 Increase to 60 units of regular insulin in TPN Continue resistant cyclic SSI and adjust as needed Monitor TPN labs F/U tube placement for nutritional support, SNF placement   Give Mg 2g IV x 1  Elenor Quinones, PharmD, [facility] Clinical Pharmacist Pager [phone] 02/15/2017 8:12 AM

## 2017-02-15 NOTE — Care Management Important Message (Signed)
Important Message  Patient Details  Name: Kaylee Mullins MRN: [id number] Date of Birth: Aug 26, 1935   Medicare Important Message Given:  Yes    Orbie Pyo 02/15/2017, 3:12 PM

## 2017-02-15 NOTE — Consult Note (Signed)
Chief Complaint: Patient was seen in consultation today for percutaneous gastric/jejunal tube placement Chief Complaint  Patient presents with  . Abdominal Pain   at the request of Dr Mamie Laurel  Referring Physician(s): Dr Mamie Laurel  Supervising Physician: Arne Cleveland  Patient Status: [facility] - In-pt  History of Present Illness: Kaylee Mullins is a 81 y.o. female   Severe malnutrition; gastroparesis Renal cell and pancreatic cancer abd mass - presented 01/12/17 Now post Whipple procedure--discharged 6/22 Returned with N/V- same day  Now PCM and intolerance to po intake Dr [name] requesting gastro-jejunal tube placement Dr Vernard Gambles has reviewed imaging and feels stomach is approachable Plan for procedure in IR 7/3 (holding Lovenox now)  Past Medical History:  Diagnosis Date  . Anxiety   . Arthritis    knees  . Black tarry stools    05-14-16 negative for occult blood with ER visit- noted in [place].  . Cancer [facility])    thyroid cancer- surgery and radiation  . Chronic kidney disease    questionable mass on kidney. Being followed by Dr Diona Fanti  . Complication of anesthesia    heart rate was really low  . Depression   . Diabetes mellitus    type 2  . Full dentures   . Hypertension   . Hypothyroidism   . Pneumonia   . Spinal stenosis   . Stroke [facility]) 09/2014   left sided weakness    Past Surgical History:  Procedure Laterality Date  . ABDOMINAL HYSTERECTOMY     partial  . cataracts     Removed  11/2015  bilateral  . COLONOSCOPY W/ POLYPECTOMY    . ERCP N/A 05/07/2016   Procedure: ENDOSCOPIC RETROGRADE CHOLANGIOPANCREATOGRAPHY (ERCP);  Surgeon: Clarene Essex, MD;  Location: [facility] ENDOSCOPY;  Service: Endoscopy;  Laterality: N/A;  . ERCP N/A 10/16/2016   Procedure: ENDOSCOPIC RETROGRADE CHOLANGIOPANCREATOGRAPHY (ERCP);  Surgeon: Clarene Essex, MD;  Location: [facility] ENDOSCOPY;  Service: Endoscopy;  Laterality: N/A;  . ESOPHAGOGASTRODUODENOSCOPY N/A 02/10/2017   Procedure:  ESOPHAGOGASTRODUODENOSCOPY (EGD);  Surgeon: Teena Irani, MD;  Location: [facility] ENDOSCOPY;  Service: Endoscopy;  Laterality: N/A;  . EUS N/A 05/27/2016   Procedure: ESOPHAGEAL ENDOSCOPIC ULTRASOUND (EUS) RADIAL;  Surgeon: Arta Silence, MD;  Location: [facility] ENDOSCOPY;  Service: Endoscopy;  Laterality: N/A;  . FLEXIBLE SIGMOIDOSCOPY  03/29/2012   Procedure: FLEXIBLE SIGMOIDOSCOPY;  Surgeon: Jeryl Columbia, MD;  Location: [facility] ENDOSCOPY;  Service: Endoscopy;  Laterality: N/A;  fleet enema upon arrival  . HOT HEMOSTASIS  03/29/2012   Procedure: HOT HEMOSTASIS (ARGON PLASMA COAGULATION/BICAP);  Surgeon: Jeryl Columbia, MD;  Location: [facility] ENDOSCOPY;  Service: Endoscopy;  Laterality: N/A;  . IR GENERIC HISTORICAL  06/30/2016   IR RADIOLOGIST EVAL & MGMT 06/30/2016 Aletta Edouard, MD [facility] INTERV RAD  . IR GENERIC HISTORICAL  09/09/2016   IR RADIOLOGIST EVAL & MGMT 09/09/2016 Aletta Edouard, MD [facility] INTERV RAD  . LAPAROSCOPY N/A 01/12/2017   Procedure: LAPAROSCOPY DIAGNOSTIC;  Surgeon: Stark Klein, MD;  Location: [place];  Service: General;  Laterality: N/A;  . LUMBAR LAMINECTOMY/DECOMPRESSION MICRODISCECTOMY N/A 02/10/2016   Procedure: Lumbar three- four Laminectomy;  Surgeon: Kristeen Miss, MD;  Location: [place] NEURO ORS;  Service: Neurosurgery;  Laterality: N/A;  L3-4 Laminectomy  . LUMBAR SPINE SURGERY     1st surgery "ray cage placed"  . THYROIDECTOMY  2005  . TONSILLECTOMY    . WHIPPLE PROCEDURE N/A 01/12/2017   Procedure: WHIPPLE PROCEDURE;  Surgeon: Stark Klein, MD;  Location: [place];  Service:  General;  Laterality: N/A;    Allergies: Hydralazine hcl; Darvon [propoxyphene hcl]; Nyquil multi-symptom [pseudoeph-doxylamine-dm-apap]; and Metformin and related  Medications: Prior to Admission medications   Medication Sig Start Date End Date Taking? Authorizing Provider  acetaminophen (TYLENOL) 500 MG tablet Take 500-1,000 mg by mouth every 6 (six) hours as needed for mild pain or moderate pain (depends on pain  if takes 1-2 tablets).   Yes [provider]  albuterol (PROVENTIL) (2.5 MG/3ML) 0.083% nebulizer solution Take 3 mLs (2.5 mg total) by nebulization every 4 (four) hours as needed for wheezing or shortness of breath. 02/04/17  Yes Stark Klein, MD  amLODipine (NORVASC) 10 MG tablet Take 1 tablet (10 mg total) by mouth daily. 02/04/17  Yes Stark Klein, MD  aspirin (ASPIRIN CHILDRENS) 81 MG chewable tablet Chew 1 tablet (81 mg total) by mouth daily. 02/04/17 02/04/18 Yes Stark Klein, MD  enoxaparin (LOVENOX) 100 MG/ML injection Inject 0.9 mLs (90 mg total) into the skin every 12 (twelve) hours. 02/04/17  Yes Stark Klein, MD  erythromycin ethylsuccinate (EES) 200 MG/5ML suspension Take 10 mLs (400 mg total) by mouth every 6 (six) hours. 02/04/17  Yes Stark Klein, MD  furosemide (LASIX) 40 MG tablet Take 40 mg by mouth daily as needed (leg swelling).   Yes [provider]  insulin aspart (NOVOLOG) 100 UNIT/ML injection Inject 0-15 Units into the skin every 4 (four) hours. 02/04/17  Yes Stark Klein, MD  irbesartan (AVAPRO) 300 MG tablet Take 1 tablet (300 mg total) by mouth daily. 02/04/17  Yes Stark Klein, MD  levothyroxine (SYNTHROID, LEVOTHROID) 200 MCG tablet Take 200 mcg by mouth daily before breakfast. For hypothyroidism 11/11/15  Yes [provider]  metoprolol succinate (TOPROL-XL) 100 MG 24 hr tablet Take 100 mg by mouth daily. Take with or immediately following a meal.    Yes [provider]  NIFEdipine (PROCARDIA-XL/ADALAT CC) 30 MG 24 hr tablet Take 1 tablet (30 mg total) by mouth 2 (two) times daily as needed (systolic BP over 607 mmHg). 02/04/17  Yes Stark Klein, MD  oxyCODONE (ROXICODONE) 5 MG/5ML solution Take 5-10 mLs (5-10 mg total) by mouth every 4 (four) hours as needed for moderate pain or severe pain. 02/02/17  Yes Stark Klein, MD  oxyCODONE-acetaminophen (PERCOCET/ROXICET) 5-325 MG tablet Take 1-2 tablets by mouth every 4 (four) hours as needed  for moderate pain. Patient taking differently: Take 0.5-1 tablets by mouth every 4 (four) hours as needed for moderate pain. Depends on pain if takes 0.5-1 tablet 02/14/16  Yes Elsner, Mallie Mussel, MD  pantoprazole (PROTONIX) 40 MG tablet Take 1 tablet (40 mg total) by mouth at bedtime. 02/04/17  Yes Stark Klein, MD  potassium chloride SA (K-DUR,KLOR-CON) 20 MEQ tablet Take 20 mEq by mouth 2 (two) times daily.   Yes [provider]  rosuvastatin (CRESTOR) 20 MG tablet Take 20 mg by mouth daily.  05/02/16  Yes [provider]  telmisartan-hydrochlorothiazide (MICARDIS HCT) 80-25 MG tablet Take 1 tablet by mouth daily.  05/01/16  Yes [provider]     Family History  Problem Relation Age of Onset  . Heart failure Mother   . Heart attack Father     Social History   Social History  . Marital status: Widowed    Spouse name: N/A  . Number of children: N/A  . Years of education: N/A   Occupational History  . retired     Buyer, retail   Social History Main Topics  . Smoking status:  Never Smoker  . Smokeless tobacco: Never Used  . Alcohol use No  . Drug use: No  . Sexual activity: No   Other Topics Concern  . None   Social History Narrative   Separated, retired, 3 children living, 2 children deceased   Caffeine use - soda every few days   Right handed   Pt lives alone.  Using cane when out and about.     Review of Systems: A 12 point ROS discussed and pertinent positives are indicated in the HPI above.  All other systems are negative.  Review of Systems  Constitutional: Positive for activity change, appetite change and fatigue. Negative for fever.  Respiratory: Negative for cough and shortness of breath.   Cardiovascular: Negative for chest pain.  Gastrointestinal: Positive for abdominal pain, constipation and nausea.  Neurological: Positive for weakness.  Psychiatric/Behavioral: Negative for behavioral problems and confusion.    Vital  Signs: BP (!) 167/68 (BP Location: Left Arm)   Pulse 75   Temp 97.7 F (36.5 C) (Oral)   Resp 18   Ht 5\' 4"  (1.626 m)   Wt 214 lb 15.2 oz (97.5 kg)   SpO2 100%   BMI 36.90 kg/m   Physical Exam  Constitutional: She is oriented to person, place, and time.  HENT:  NG in place  Cardiovascular: Normal rate and regular rhythm.   Pulmonary/Chest: Effort normal and breath sounds normal.  Abdominal: Soft. Bowel sounds are normal. There is no tenderness.  Musculoskeletal: Normal range of motion.  Neurological: She is alert and oriented to person, place, and time.  Skin: Skin is warm and dry.  Psychiatric: She has a normal mood and affect. Her behavior is normal. Judgment and thought content normal.  Nursing note and vitals reviewed.   Mallampati Score:  MD Evaluation Airway: WNL Heart: WNL Abdomen: WNL Chest/ Lungs: WNL ASA  Classification: 2 Mallampati/Airway Score: Two  Imaging: Ct Abdomen Pelvis Wo Contrast  Result Date: 02/06/2017 CLINICAL DATA:  Abdominal pain. Status post Whipple Jan 12, 2017. History of gastroparesis. EXAM: CT ABDOMEN AND PELVIS WITHOUT CONTRAST TECHNIQUE: Multidetector CT imaging of the abdomen and pelvis was performed following the standard protocol without IV contrast. Oral contrast administered. COMPARISON:  Upper GI series February 03, 2017 and CT abdomen and pelvis October 23, 2016. FINDINGS: LOWER CHEST: LEFT lower lobe atelectasis/scar. Mild cardiomegaly. Severe coronary artery calcifications versus stent. No pericardial effusion. HEPATOBILIARY: Status post cholecystectomy. Normal noncontrast CT liver, no pneumobilia on today's examination. PANCREAS: Status post Whipple surgery, with pancreatic stent. No focal fluid collection. SPLEEN: Normal. ADRENALS/URINARY TRACT: Kidneys are orthotopic, demonstrating normal size and morphology. No nephrolithiasis, hydronephrosis; limited assessment for renal masses on this nonenhanced examination. 2 cm RIGHT upper pole cyst.  The unopacified ureters are normal in course and caliber. Urinary bladder is partially distended and unremarkable. Normal adrenal glands. STOMACH/BOWEL: Retained barium in the gastric cardia results in streak artifact. Mild contrast distended stomach. Asymmetric gastric wall thickening at the level of surgical anastomosis. No contrast extravasation. Small amount of enteric contrast within the jejunum. Retained contrast in the colon which is normal in course and caliber. Mild sigmoid diverticulosis. Distal duodenum is not identified, possibly decompressed. VASCULAR/LYMPHATIC: Aortoiliac vessels are normal in course and caliber. No lymphadenopathy by CT size criteria. REPRODUCTIVE: Status post hysterectomy . OTHER: No intraperitoneal free fluid or free air. MUSCULOSKELETAL: Non-acute. Small fat containing RIGHT inguinal hernia. Status post L4-5 interbody fusion and laminectomy. Severe L3-4 degenerative disc and grade 1 L3-4 anterolisthesis with severe  facet arthropathy consistent with adjacent segment disease. Severe RIGHT L3-4 neural foraminal narrowing. IMPRESSION: Status post Whipple procedure. Nonspecific eccentric gastric wall thickening at anastomosis,, likely inflammatory given recent surgery . Recommend close attention on follow-up imaging versus endoscopy. Gastric distention retained barium in stomach from 3 days prior consistent with gastroparesis. Pancreatic stent.  No biliary dilatation by noncontrast CT. Electronically Signed   By: Elon Alas M.D.   On: 02/06/2017 00:58   Dg Abd 2 Views  Result Date: 01/29/2017 CLINICAL DATA:  Acute onset of generalized abdominal bloating. Initial encounter. EXAM: ABDOMEN - 2 VIEW COMPARISON:  CT of the abdomen and pelvis performed 10/23/2016, and chest radiograph performed 01/15/2017 FINDINGS: There is diffuse distention of the stomach. The visualized bowel gas pattern is otherwise grossly unremarkable. Postoperative change is seen along the anterior midline  abdominal wall. A pancreatic duct stent is now seen. No free intra-abdominal air is seen on the upright view. A small right pleural effusion is noted. No acute osseous abnormalities are identified. IMPRESSION: Diffuse distention of the stomach may reflect gastroparesis. No free intra-abdominal air seen. Small right pleural effusion noted. Electronically Signed   By: Garald Balding M.D.   On: 01/29/2017 02:48   Dg Abd Portable 1v  Result Date: 02/10/2017 CLINICAL DATA:  Vomiting for 2 weeks EXAM: PORTABLE ABDOMEN - 1 VIEW COMPARISON:  02/06/2017 FINDINGS: Contrast material is noted throughout the colon similar to that seen on prior CT examination. Nasogastric catheter is noted within the stomach. A pancreatic stent appears in satisfactory position. No obstructive changes are seen. No free air is noted. No acute bony abnormality is seen. IMPRESSION: No acute abnormality noted. Electronically Signed   By: Inez Catalina M.D.   On: 02/10/2017 15:44   Dg Ugi  W/kub  Result Date: 02/12/2017 CLINICAL DATA:  Delayed gastric emptying after Whipple procedure. EXAM: UPPER GI SERIES WITH KUB TECHNIQUE: After obtaining a scout radiograph a routine upper GI series was performed using thin density barium. FLUOROSCOPY TIME:  Fluoroscopy Time:  3 minutes 18 seconds COMPARISON:  Upper GI dated 02/03/2017 and KUB dated 02/10/2017 FINDINGS: The KUB demonstrates contrast in the colon, essentially unchanged since the prior study of 02/10/2017. There are no dilated loops of large or small bowel. Pancreatic stent and NG tube are in place. We injected thin density barium through the indwelling NG tube into the stomach. There is no peristalsis in the stomach. The patient was placed in the right lateral decubitus position for 20 minutes and at that time there was passage of contrast into the gastrojejunostomy with no evidence of stricture or mass. However, there is no significant peristalsis seen in the stomach or jejunum during this  exam. The patient had spontaneous prominent gastroesophageal reflux to the level of the thoracic inlet. IMPRESSION: 1. Gastroparesis. Gravity drainage of the stomach in the right lateral decubitus position only. 2. Prominent gastroesophageal reflux to the level of the thoracic inlet. 3. Contrast remains in the nondilated colon, essentially unchanged since 02/10/2017 consistent with ileus. Electronically Signed   By: Lorriane Shire M.D.   On: 02/12/2017 11:43   Dg Ugi  W/kub  Result Date: 02/03/2017 CLINICAL DATA:  81 year old female status post Whipple on 01/12/2017. Not tolerating p.o. well, frequent nausea and vomiting. Weakness. EXAM: UPPER GI SERIES WITH KUB TECHNIQUE: After obtaining a scout radiograph a routine upper GI series was performed using thin barium FLUOROSCOPY TIME:  Fluoroscopy Time:  5 minutes 12 seconds Radiation Exposure Index (if provided by the fluoroscopic device):  Number of Acquired Spot Images: 0 COMPARISON:  Abdominal radiographs 01/28/2017. Preoperative CT Abdomen and Pelvis 10/23/2016. FINDINGS: Preprocedural scout view of the abdomen. Paucity of bowel gas, but no dilated loops are evident. In the mid abdomen a curvilinear plastic catheter or stent is in place. This might be at the level of the residual pancreatic duct. Underlying previous lumbar fusion with cage devices. The procedure was performed with the patient inclined on the fluoroscopy table using single contrast technique. The patient did experience nausea during ingestion of the barium, but tolerated the study well. No obstruction to the forward flow of contrast throughout the esophagus and into the stomach. Normal esophageal course and contour. Decreased esophageal motility. As barium began to enter the stomach, it was seen to be diluted into other abundant gastric fluid (series 6). Initially barium was accumulating within the gastric fundus and proximal body. Normal gastric contour in this region was noted. The patient was  turned right oblique, and then ultimately right decubitus. Barium flowed into the dependent distal portion of the stomach, and at this point a moderate to severe degree of gastric distention, mostly with radiolucent fluid, became evident. See series 14. Also, degree of gastric ptosis is suspected. See also series 31. No gastric emptying was observed until 25 minutes into the exam. At that point a small volume of barium opacified a nondilated small bowel loop. The small bowel mucosal pattern is normal, but slow, diminished small bowel peristalsis was observed (series 28). The stomach to small bowel anastomosis appears to be at the level of the gastric antrum, best seen on series 25 and 28, and appears within normal limits. IMPRESSION: 1. Moderately to severely dilated stomach with no gastric motility. Suspect gastroparesis. 2. Delayed gastric emptying, not until about 25 minutes into the exam. 3. Relatively good visualization of the gastrojejunostomy which appears normal without stricture. However, peristalsis in the efferent limb was slow/diminished. 4. Pancreatic region stent in place, but the biliopancreatic limb was not opacified. 5. Study discussed by telephone with Dr. Stark Klein on 02/03/2017 at 0945 hours. Electronically Signed   By: Genevie Ann M.D.   On: 02/03/2017 10:00    Labs:  CBC:  Recent Labs  02/06/17 0553 02/08/17 0427 02/13/17 0500 02/15/17 0442  WBC 7.0 6.5 7.7 8.3  HGB 9.7* 9.1* 9.4* 9.4*  HCT 30.3* 29.4* 29.2* 29.7*  PLT 245 193 184 227    COAGS:  Recent Labs  07/07/16 1359 01/04/17 1308 01/13/17 0423 01/18/17 1707  INR 1.14 1.20 1.55 1.31    BMP:  Recent Labs  02/11/17 0407 02/12/17 0700 02/13/17 0500 02/15/17 0442  NA 139 136 136 136  K 4.0 4.2 4.2 4.2  CL 110 109 106 107  CO2 20* 22 23 23   GLUCOSE 188* 144* 175* 140*  BUN 21* 27* 19 22*  CALCIUM 9.0 8.8* 8.8* 8.7*  CREATININE 0.86 0.91 0.81 0.77  GFRNONAA >60 59* >60 >60  GFRAA >60 >60 >60 >60     LIVER FUNCTION TESTS:  Recent Labs  02/06/17 0553 02/08/17 0427 02/11/17 0407 02/15/17 0442  BILITOT 0.9 0.5 0.4 0.5  AST 23 20 17 28   ALT 18 18 14 23   ALKPHOS 74 59 60 70  PROT 7.5 6.7 6.5 6.6  ALBUMIN 4.3 3.5 3.3* 3.2*    TUMOR MARKERS: No results for input(s): AFPTM, CEA, CA199, CHROMGRNA in the last 8760 hours.  Assessment and Plan:  Protein calorie malnutrition Post Whipple surgery Pancreatic cancer and renal cell cancer Intractable N/V  NG in place Scheduled for percutaneous gastro-jejunal tube placement Plan for 7/3 in IR Hold Lovenox now Risks and Benefits discussed with the patient including, but not limited to the need for a barium enema during the procedure, bleeding, infection, peritonitis, or damage to adjacent structures. All of the patient's questions were answered, patient is agreeable to proceed. Consent signed and in chart.  Thank you for this interesting consult.  I greatly enjoyed meeting QUINLEE SCIARRA and look forward to participating in their care.  A copy of this report was sent to the requesting provider on this date.  Electronically Signed: Lavonia Drafts, PA-C 02/15/2017, 10:51 AM   I spent a total of 40 Minutes    in face to face in clinical consultation, greater than 50% of which was counseling/coordinating care for percutaneous gastro-jejunal tube placement

## 2017-02-16 ENCOUNTER — Inpatient Hospital Stay (HOSPITAL_COMMUNITY): Payer: Medicare Other

## 2017-02-16 ENCOUNTER — Encounter (HOSPITAL_COMMUNITY): Payer: Self-pay | Admitting: Interventional Radiology

## 2017-02-16 HISTORY — PX: IR GASTROSTOMY TUBE MOD SED: IMG625

## 2017-02-16 LAB — GLUCOSE, CAPILLARY
Glucose-Capillary: 125 mg/dL — ABNORMAL HIGH (ref 65–99)
Glucose-Capillary: 140 mg/dL — ABNORMAL HIGH (ref 65–99)
Glucose-Capillary: 147 mg/dL — ABNORMAL HIGH (ref 65–99)
Glucose-Capillary: 151 mg/dL — ABNORMAL HIGH (ref 65–99)
Glucose-Capillary: 155 mg/dL — ABNORMAL HIGH (ref 65–99)
Glucose-Capillary: 167 mg/dL — ABNORMAL HIGH (ref 65–99)

## 2017-02-16 LAB — APTT: aPTT: 42 seconds — ABNORMAL HIGH (ref 24–36)

## 2017-02-16 LAB — CBC
HCT: 29.9 % — ABNORMAL LOW (ref 36.0–46.0)
Hemoglobin: 9.4 g/dL — ABNORMAL LOW (ref 12.0–15.0)
MCH: 30.3 pg (ref 26.0–34.0)
MCHC: 31.4 g/dL (ref 30.0–36.0)
MCV: 96.5 fL (ref 78.0–100.0)
Platelets: 244 10*3/uL (ref 150–400)
RBC: 3.1 MIL/uL — ABNORMAL LOW (ref 3.87–5.11)
RDW: 14.3 % (ref 11.5–15.5)
WBC: 7.1 10*3/uL (ref 4.0–10.5)

## 2017-02-16 LAB — PROTIME-INR
INR: 1.24
Prothrombin Time: 15.6 seconds — ABNORMAL HIGH (ref 11.4–15.2)

## 2017-02-16 MED ORDER — CEFAZOLIN SODIUM-DEXTROSE 2-4 GM/100ML-% IV SOLN
2.0000 g | INTRAVENOUS | Status: AC
  Administered 2017-02-16: 2 g via INTRAVENOUS
  Filled 2017-02-16: qty 100

## 2017-02-16 MED ORDER — FENTANYL CITRATE (PF) 100 MCG/2ML IJ SOLN
INTRAMUSCULAR | Status: AC | PRN
  Administered 2017-02-16 (×2): 50 ug via INTRAVENOUS

## 2017-02-16 MED ORDER — MIDAZOLAM HCL 2 MG/2ML IJ SOLN
INTRAMUSCULAR | Status: AC
  Filled 2017-02-16: qty 4

## 2017-02-16 MED ORDER — IOPAMIDOL (ISOVUE-300) INJECTION 61%
INTRAVENOUS | Status: AC
  Administered 2017-02-16: 20 mL
  Filled 2017-02-16: qty 50

## 2017-02-16 MED ORDER — FAT EMULSION 20 % IV EMUL
240.0000 mL | INTRAVENOUS | Status: AC
  Administered 2017-02-16: 240 mL via INTRAVENOUS
  Filled 2017-02-16: qty 250

## 2017-02-16 MED ORDER — CEFAZOLIN SODIUM-DEXTROSE 2-4 GM/100ML-% IV SOLN
INTRAVENOUS | Status: AC
  Administered 2017-02-16: 2 g via INTRAVENOUS
  Filled 2017-02-16: qty 100

## 2017-02-16 MED ORDER — TRACE MINERALS CR-CU-MN-SE-ZN 10-1000-500-60 MCG/ML IV SOLN
INTRAVENOUS | Status: AC
  Administered 2017-02-16: 18:00:00 via INTRAVENOUS
  Filled 2017-02-16: qty 1914

## 2017-02-16 MED ORDER — GLUCAGON HCL RDNA (DIAGNOSTIC) 1 MG IJ SOLR
INTRAMUSCULAR | Status: AC
  Filled 2017-02-16: qty 1

## 2017-02-16 MED ORDER — LIDOCAINE HCL 1 % IJ SOLN
INTRAMUSCULAR | Status: AC | PRN
  Administered 2017-02-16: 10 mL

## 2017-02-16 MED ORDER — ONDANSETRON HCL 4 MG/2ML IJ SOLN
INTRAMUSCULAR | Status: AC
  Administered 2017-02-16: 4 mg via INTRAVENOUS
  Filled 2017-02-16: qty 2

## 2017-02-16 MED ORDER — LIDOCAINE HCL (PF) 1 % IJ SOLN
INTRAMUSCULAR | Status: AC
  Filled 2017-02-16: qty 30

## 2017-02-16 MED ORDER — FENTANYL CITRATE (PF) 100 MCG/2ML IJ SOLN
INTRAMUSCULAR | Status: AC
  Filled 2017-02-16: qty 4

## 2017-02-16 MED ORDER — DIATRIZOATE MEGLUMINE & SODIUM 66-10 % PO SOLN
ORAL | Status: AC
  Filled 2017-02-16: qty 30

## 2017-02-16 MED ORDER — MIDAZOLAM HCL 2 MG/2ML IJ SOLN
INTRAMUSCULAR | Status: AC | PRN
  Administered 2017-02-16 (×2): 1 mg via INTRAVENOUS

## 2017-02-16 NOTE — Sedation Documentation (Signed)
Gtube placed- unable to place Jtube

## 2017-02-16 NOTE — Progress Notes (Signed)
PHARMACY - ADULT TOTAL PARENTERAL NUTRITION CONSULT NOTE   Pharmacy Consult:  TPN Indication: Ileus  Patient Measurements: Height: 5\' 4"  (162.6 cm) Weight: 214 lb 15.2 oz (97.5 kg) IBW/kg (Calculated) : 54.7 TPN AdjBW (KG): 63.5 Body mass index is 36.9 kg/m.  Assessment:  50 YOF with severe malnutrition related to renal cell and pancreatic cancer who presented with abdominal mass on 01/12/17.  She is s/p Whipple procedure on the same day for pancreatic cancer.  She was discharged 02/05/17 and readmitted same day for vomiting at SNF. Pharmacy consulted to manage TPN for ileus that prevented oral intake and caused significant nausea and vomiting.   GI: On TPN for prolonged ileus. Needs solution for G-tube dependence. Surgery may place Kirvin or J tube soon. NG output down to 450 ml yesterday. Erythromycin for gastroparesis. EGD on 6/27 showed no acute findings, a lot of bilious fluid. Repeat GES- no obstruction, no motility of stomach. Albumin ok at 3.2, prealbumin 14.9. PRN Zofran/Phenergan (QTc 440ms on 6/17) Endo: hypothyroid on Synthroid.  DM not on med PTA. CBGs mostly controlled (120-200s) on TPN and controlled (100-160s) off TPN. Insulin requirements in the past 24 hours: 22 units SSI + 60 units in TPN Lytes:  wnl exc Mg 1.7 (goal > 2 with ileus) but replaced yesterday Renal: SCr stable, CrCl ~50ml/min. UOP up to 1.4 ml/kg/hr. BUN 22. NS with 20K at 50 ml/hr. Pulm: stable on RA Cards: HTN. BP elevated, HR controlled- amlodipine, irbesartan, Toprol, nifedipine xl prn sbp > 170 AC: Lovenox for new DVT - dose appropriate, mild anemia, plts WNL Hepatobil: LFTs and Tbili wnl. Trig 74. Neuro: hx CVA 2016 with residual L-hand and foot weakness ID: afebrile, WBC WNL - not on abx Best Practices: Lovenox  TPN Access: triple lumen PICC placed 6/11 - replaced 02/02/17 TPN start date: 01/23/17  Nutritional Goals (per RD recommendation on 6/27): 1800-1950 kCal 82-93 g protein per day  Current  Nutrition:  TPN + IV lipid emulsion  Plan:   Continue cyclic Clinimix E 0/34. Infuse 1914 mL over 16 hrs: 75 mL/hr x 1 hr, then 126 mL/hr x 14 hrs, then 75 mL/hr x 1 hr. Advance cycle once CBGs are controlled Continue 20% lipid emulsion at 20 ml/hr over 12 hrs Continue NS with 6mEq of KCl at 50 ml/hr TPN and IV lipid emulsion provides 96 g of protein and 1839 kCals per day meeting 100% of protein and kCal needs Continue MVI in TPN Continue TE in TPN every other day, next 7/3 Increase to 70 units of regular insulin in TPN Continue resistant cyclic SSI and adjust as needed Monitor TPN labs F/U G tube for drainage and then conversion to Schulter tube, SNF placement    Elenor Quinones, PharmD, [facility] Clinical Pharmacist Pager [phone] 02/16/2017 7:52 AM

## 2017-02-16 NOTE — Clinical Social Work Note (Signed)
CSW met with pt at bedside today. Pt states she feels a little better. Pt was discharged to [facility] for TPN management and P/T and returned to hospital same day. CSW continuing to follow for pt support and disposition.   [name], [place], [place] Work [phone]

## 2017-02-16 NOTE — Procedures (Signed)
Interventional Radiology Procedure Note  Procedure: Gastrostomy tube placement  Complications: None  Estimated Blood Loss: < 10 mL  20 Fr bumper retention gastrostomy tube placed with tip in body of stomach.  Unable to opacify or catheterize efferent jejunum to allow extension of feeding tube into jejunum. If patient does not tolerate gastric feeds, could retry jejunal catheterization at later time.  Venetia Night. Kathlene Cote, M.D Pager:  [phone]

## 2017-02-16 NOTE — Sedation Documentation (Signed)
NSR

## 2017-02-16 NOTE — Sedation Documentation (Signed)
Patient is resting comfortably. 

## 2017-02-16 NOTE — Sedation Documentation (Signed)
NSR rate 80's

## 2017-02-16 NOTE — Sedation Documentation (Signed)
Awaiting to see MD

## 2017-02-16 NOTE — Progress Notes (Signed)
6 Days Post-Op  Subjective: No events.  TNA getting cycled.    Objective: Vital signs in last 24 hours: Temp:  [98.2 F (36.8 C)-99.5 F (37.5 C)] 98.2 F (36.8 C) (07/03 0508) Pulse Rate:  [80-88] 88 (07/03 0508) Resp:  [18-19] 18 (07/03 0508) BP: (150-160)/(39-50) 160/50 (07/03 0508) SpO2:  [100 %] 100 % (07/03 0508) Last BM Date: 01/12/17  Intake/Output from previous day: 07/02 0701 - 07/03 0700 In: 2546.6 [I.V.:2446.6; NG/GT:100] Out: 3750 [Urine:3300; Emesis/NG output:450] Intake/Output this shift: Total I/O In: 0  Out: 300 [Urine:300]  EXAM: General appearance: Alert.  No distress.  Quite pleasant.  Sitting in chair.  Just got cleaned up. Resp: breathing comfortably GI: Soft.  Nontender.  Nondistended.  Midline wound well healed. Extremities: warm and well perfused.  Lab Results:  Results for orders placed or performed during the hospital encounter of 02/05/17 (from the past 24 hour(s))  Glucose, capillary     Status: Abnormal   Collection Time: 02/15/17 11:52 AM  Result Value Ref Range   Glucose-Capillary 165 (H) 65 - 99 mg/dL  Glucose, capillary     Status: Abnormal   Collection Time: 02/15/17  4:08 PM  Result Value Ref Range   Glucose-Capillary 109 (H) 65 - 99 mg/dL  Glucose, capillary     Status: Abnormal   Collection Time: 02/15/17  8:20 PM  Result Value Ref Range   Glucose-Capillary 208 (H) 65 - 99 mg/dL  Glucose, capillary     Status: Abnormal   Collection Time: 02/16/17 12:12 AM  Result Value Ref Range   Glucose-Capillary 155 (H) 65 - 99 mg/dL  APTT     Status: Abnormal   Collection Time: 02/16/17  4:56 AM  Result Value Ref Range   aPTT 42 (H) 24 - 36 seconds  Protime-INR     Status: Abnormal   Collection Time: 02/16/17  4:56 AM  Result Value Ref Range   Prothrombin Time 15.6 (H) 11.4 - 15.2 seconds   INR 1.24   CBC     Status: Abnormal   Collection Time: 02/16/17  4:56 AM  Result Value Ref Range   WBC 7.1 4.0 - 10.5 K/uL   RBC 3.10 (L) 3.87  - 5.11 MIL/uL   Hemoglobin 9.4 (L) 12.0 - 15.0 g/dL   HCT 29.9 (L) 36.0 - 46.0 %   MCV 96.5 78.0 - 100.0 fL   MCH 30.3 26.0 - 34.0 pg   MCHC 31.4 30.0 - 36.0 g/dL   RDW 14.3 11.5 - 15.5 %   Platelets 244 150 - 400 K/uL  Glucose, capillary     Status: Abnormal   Collection Time: 02/16/17  5:07 AM  Result Value Ref Range   Glucose-Capillary 151 (H) 65 - 99 mg/dL  Glucose, capillary     Status: Abnormal   Collection Time: 02/16/17  7:48 AM  Result Value Ref Range   Glucose-Capillary 125 (H) 65 - 99 mg/dL   Comment 1 Notify RN      Studies/Results: No results found.  Marland Kitchen amLODipine  10 mg Per NG tube Daily  . chlorhexidine  15 mL Mouth Rinse BID  . [START ON 02/17/2017] enoxaparin  90 mg Subcutaneous Q12H  . erythromycin  200 mg Oral Q6H  . insulin aspart  0-20 Units Subcutaneous 6 times per day  . irbesartan  300 mg Per NG tube Daily  . levothyroxine  200 mcg Per NG tube QAC breakfast  . mouth rinse  15 mL Mouth Rinse q12n4p  .  metoprolol succinate  100 mg Oral Daily  . nystatin  5 mL Oral QID  . pantoprazole (PROTONIX) IV  40 mg Intravenous Q24H     Assessment/Plan: s/p Procedure(s): Whipple 5/31 ESOPHAGOGASTRODUODENOSCOPY (EGD) this admit.   Patient Active Problem List   Diagnosis Date Noted  . Malnutrition of moderate degree 02/07/2017  . Gastroparesis 02/06/2017  . Adenocarcinoma ([place]) 01/12/2017  . 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 Essential hypertension 02/11/2016  . Lumbar stenosis with neurogenic claudication 02/10/2016  . Hyperlipidemia 02/10/2016  . Hypothyroidism 02/10/2016  . Musculoskeletal neck pain 12/13/2014  . Muscular pain 12/13/2014  . Numbness   . Acute CVA (cerebrovascular accident) ([place]) 09/16/2014  . Right renal mass 09/13/2014  . Cerebral infarction due to unspecified mechanism   . CVA (cerebral infarction) 09/11/2014  . Osteoarthritis 09/11/2014  . Postsurgical hypothyroidism 09/11/2014  . Uncontrolled hypertension 09/11/2014   . History of CVA (cerebral vascular accident) ([place]) 09/11/2014  . Diabetes mellitus without complication ([place]) 65/78/4696  . Dyslipidemia 04/20/2007   s/p Whipple with complication of gastroparesis and persistent n/v -SIPS -continue TPN for prolonged ileus. -up to chair and ambulate There is unobstructed outflow from stomach, but no motility of stomach. Outflow seems gravity dependent. Continue erythromycin and postural drainage. Social work consult working with family -hope for IR G tube today.  @PROBHOSP @  LOS: 10 days    Kaylee Mullins 02/16/2017

## 2017-02-16 NOTE — Progress Notes (Signed)
Nutrition Follow-up  DOCUMENTATION CODES:   Non-severe (moderate) malnutrition in context of acute illness/injury, Obesity unspecified  INTERVENTION:   -TPN management per pharmacy -RD will follow for diet advancement and supplement as appropriate  NUTRITION DIAGNOSIS:   Malnutrition related to chronic illness, acute illness as evidenced by mild depletion of muscle mass, mild depletion of body fat.  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 Had prolonged postop course due to delayed gastric emptying due to gastroparesis and intolerance of TNA, ultimately started on TPN. Represents from SNF after being discharged for less than 12 hours, due to vomiting and abdominal pain.   6/27- s/p endoscopy, which revealed normal esophagus, gastrojejunostmy with healthy appearing mucosa, and stomach with a lot of bilious fluid 6/27- NGT placed 6/30- per [facility] notes, unobstructed outflow from stomach, but no motility (outflow seems gravity dependent)  Pt remains NPO, with sips and chips from the floor.   Pt underwent G-tube placement today. IR unable to extend tube into jejunum, but recommending jejunal catheterization at a later date.  Pt remains with high output from NGT- noted 1,110 ml within the past 24 hours.   Pt remains on TPN support; plan to continue cyclic Clinimix E 8/88. Infuse 1914 mL over 16 hrs: 75 mL/hr x 1 hr, then 126 mL/hr x 14 hrs, then 75 mL/hr x 1 hr. Continue 20% lipid emulsion at 20 ml/hr over 12 hrs. Plan to advance cycle once CBGs are controlled. TPN and IV lipid emulsion provides 96 g of protein and 1839 kCals per day meeting 100% of protein and kCal needs.  Labs reviewed:  CBGS: 125-151.   Diet Order:  Diet NPO time specified Except for: Sips with Meds TPN (CLINIMIX-E) Adult  Skin:   (closed abdominal incision)  Last BM:  02/06/17  Height:   Ht Readings from Last 1 Encounters:  02/06/17 _0  (1.626 m)    Weight:   Wt Readings from Last 1 Encounters:  02/13/17 214 lb 15.2 oz (97.5 kg)    Ideal Body Weight:  54.54 kg  BMI:  Body mass index is 36.9 kg/m.  Estimated Nutritional Needs:   Kcal:  1800-1950 (20-22 kcal/kg bw)  Protein:  82-93g Pro (1.5-1.7g/kg ibw)  Fluid:  1.8-2 L fluid (1 ml/kcal)  EDUCATION NEEDS:   No education needs identified at this time  Zorina Mallin A. Jimmye [name], RD, LDN, CDE Pager: [phone] After hours Pager: [phone]

## 2017-02-17 LAB — GLUCOSE, CAPILLARY
Glucose-Capillary: 126 mg/dL — ABNORMAL HIGH (ref 65–99)
Glucose-Capillary: 144 mg/dL — ABNORMAL HIGH (ref 65–99)
Glucose-Capillary: 157 mg/dL — ABNORMAL HIGH (ref 65–99)
Glucose-Capillary: 166 mg/dL — ABNORMAL HIGH (ref 65–99)
Glucose-Capillary: 174 mg/dL — ABNORMAL HIGH (ref 65–99)
Glucose-Capillary: 90 mg/dL (ref 65–99)

## 2017-02-17 MED ORDER — FAT EMULSION 20 % IV EMUL
240.0000 mL | INTRAVENOUS | Status: AC
  Administered 2017-02-17: 240 mL via INTRAVENOUS
  Filled 2017-02-17: qty 250

## 2017-02-17 MED ORDER — M.V.I. ADULT IV INJ
INTRAVENOUS | Status: AC
  Administered 2017-02-17: 18:00:00 via INTRAVENOUS
  Filled 2017-02-17: qty 1914

## 2017-02-17 NOTE — Care Management Important Message (Signed)
Important Message  Patient Details  Name: Kaylee Mullins MRN: [id number] Date of Birth: 05-09-1936   Medicare Important Message Given:  Yes    [name] 02/17/2017, 10:13 AM

## 2017-02-17 NOTE — Progress Notes (Signed)
PHARMACY - ADULT TOTAL PARENTERAL NUTRITION CONSULT NOTE   Pharmacy Consult:  TPN Indication: Ileus  Patient Measurements: Height: 5\' 4"  (162.6 cm) Weight: 214 lb 15.2 oz (97.5 kg) IBW/kg (Calculated) : 54.7 TPN AdjBW (KG): 63.5 Body mass index is 36.9 kg/m.  Assessment:  54 YOF with severe malnutrition related to renal cell and pancreatic cancer who presented with abdominal mass on 01/12/17.  She is s/p Whipple procedure on the same day for pancreatic cancer.  She was discharged 02/05/17 and readmitted same day for vomiting at SNF. Pharmacy consulted to manage TPN for ileus that prevented oral intake and caused significant nausea and vomiting.   GI: On TPN for prolonged ileus. Needs solution for G-tube dependence. IR placed G tube yesterday. NG output down to 200 ml yesterday. Erythromycin for gastroparesis. EGD on 6/27 showed no acute findings, a lot of bilious fluid. Repeat GES- no obstruction, no motility of stomach. Albumin ok at 3.2, prealbumin 14.9. PRN Zofran/Phenergan (QTc 471ms on 6/17) Endo: hypothyroid on Synthroid.  DM not on med PTA. CBGs mostly controlled (120-170s) on TPN and controlled (140s) off TPN. Insulin requirements in the past 24 hours: 20 units SSI + 60 units in TPN Lytes:  wnl exc Mg 1.7 (goal > 2 with ileus) but replaced Renal: SCr stable, CrCl ~68ml/min. UOP up to 1.4 ml/kg/hr. BUN 22. NS with 20K at 50 ml/hr. Pulm: stable on RA Cards: HTN. BP elevated, HR controlled- amlodipine, irbesartan, Toprol, nifedipine xl prn sbp > 170 AC: Lovenox for new DVT - dose appropriate, mild anemia, plts WNL Hepatobil: LFTs and Tbili wnl. Trig 74. Neuro: hx CVA 2016 with residual L-hand and foot weakness ID: afebrile, WBC WNL - not on abx Best Practices: Lovenox  TPN Access: triple lumen PICC placed 6/11 - replaced 02/02/17 TPN start date: 01/23/17   Nutritional Goals (per RD recommendation on 7/3): 1800-1950 kCal 82-93 g protein per day  Current Nutrition:  TPN + IV lipid  emulsion  Plan:   Continue cyclic Clinimix E 5/05. Infuse 1914 mL over 14 hrs: 75 mL/hr x 1 hr, then 147 mL/hr x 12 hrs, then 75 mL/hr x 1 hr. Continue 20% lipid emulsion at 20 ml/hr over 12 hrs Continue NS with 48mEq of KCl at 50 ml/hr TPN and IV lipid emulsion provides 96 g of protein and 1839 kCals per day meeting 100% of protein and kCal needs Continue MVI in TPN Continue TE in TPN every other day, next 7/5 Continue 70 units of regular insulin in TPN Continue resistant cyclic SSI and adjust as needed Monitor TPN labs F/U SNF placement, ability to start TFs once ileus resolved   Elenor Quinones, PharmD, BCPS Clinical Pharmacist Pager [phone] 02/17/2017 8:11 AM

## 2017-02-17 NOTE — Progress Notes (Signed)
7 Days Post-Op   Subjective/Chief Complaint: Doing well today no n/v Getting out of bed   Objective: Vital signs in last 24 hours: Temp:  [98.4 F (36.9 C)-99.3 F (37.4 C)] 99 F (37.2 C) (07/04 0407) Pulse Rate:  [79-89] 88 (07/04 0407) Resp:  [16-24] 19 (07/04 0407) BP: (150-194)/(6-98) 169/59 (07/04 0407) SpO2:  [95 %-100 %] 99 % (07/04 0407) Last BM Date: 02/06/17  Intake/Output from previous day: 07/03 0701 - 07/04 0700 In: 1518 [I.V.:1418; NG/GT:100] Out: 1925 [Urine:1725; Emesis/NG output:200] Intake/Output this shift: Total I/O In: 0  Out: 400 [Urine:400]  General appearance: alert and cooperative GI: soft, non-tender; bowel sounds normal; no masses,  no organomegaly and Gtube to gravity  Lab Results:   Recent Labs  02/15/17 0442 02/16/17 0456  WBC 8.3 7.1  HGB 9.4* 9.4*  HCT 29.7* 29.9*  PLT 227 244   BMET  Recent Labs  02/15/17 0442  NA 136  K 4.2  CL 107  CO2 23  GLUCOSE 140*  BUN 22*  CREATININE 0.77  CALCIUM 8.7*   PT/INR  Recent Labs  02/16/17 0456  LABPROT 15.6*  INR 1.24   ABG No results for input(s): PHART, HCO3 in the last 72 hours.  Invalid input(s): PCO2, PO2  Studies/Results: Ir Gastrostomy Tube Mod Sed  Result Date: 02/16/2017 CLINICAL DATA:  Status post prior Whipple procedure for pancreatic carcinoma with gastroparesis and persistent nausea and vomiting. EXAM: PERCUTANEOUS GASTROSTOMY TUBE PLACEMENT ANESTHESIA/SEDATION: 3.0 mg IV Versed; 100 mcg IV Fentanyl. Total Moderate Sedation Time 25 minutes. The patient's level of consciousness and physiologic status were continuously monitored during the procedure by Radiology nursing. CONTRAST:  30 mL Isovue-300 MEDICATIONS: 2 g IV Ancef. IV antibiotic was administered in an appropriate time interval prior to needle puncture of the skin. FLUOROSCOPY TIME:  11 minutes and 24 seconds.  493 mGy. PROCEDURE: The procedure, risks, benefits, and alternatives were explained to the  patient. Questions regarding the procedure were encouraged and answered. The patient understands and consents to the procedure. A time-out was performed prior to initiating the procedure. A 5-French catheter was then advanced through the the patient's mouth under fluoroscopy into the esophagus and to the level of the stomach. This catheter was used to insufflate the stomach with air under fluoroscopy. The abdominal wall was prepped with chlorhexidine in a sterile fashion, and a sterile drape was applied covering the operative field. A sterile gown and sterile gloves were used for the procedure. Local anesthesia was provided with 1% Lidocaine. A skin incision was made in the upper abdominal wall. Under fluoroscopy, an 18 gauge trocar needle was advanced into the stomach. Contrast injection was performed to confirm intraluminal position of the needle tip. A single T tack was then deployed in the lumen of the stomach. This was brought up to tension at the skin surface. Over a guidewire, a 9-French sheath was advanced into the lumen of the stomach. The wire was left in place as a safety wire. A loop snare device from a percutaneous gastrostomy kit was then advanced into the stomach. A floppy guide wire was advanced through the orogastric catheter under fluoroscopy in the stomach. The loop snare advanced through the percutaneous gastric access was used to snare the guide wire. This allowed withdrawal of the loop snare out of the patient's mouth by retraction of the orogastric catheter and wire. A 20-French bumper retention gastrostomy tube was looped around the snare device. It was then pulled back through the patient's mouth.  The retention bumper was brought up to the anterior gastric wall. The T tack suture was cut at the skin. After initial gastrostomy tube placement, a coaxial 5 [place] multipurpose catheter was advanced through the gastrostomy tube and utilized in attempted catheterization of the jejunum. Contrast  reaches injected through the 5 French catheter and guidewires advanced through the catheter. The exiting gastrostomy tube was cut to appropriate length and a feeding adapter applied. The catheter was injected with contrast material to confirm position and a fluoroscopic spot image saved. The tube was then flushed with saline. A dressing was applied over the gastrostomy exit site. COMPLICATIONS: None. FINDINGS: The stomach distended well with air allowing safe placement of the gastrostomy tube. After placement, the tip of the gastrostomy tube lies in the body of the stomach. After gastrostomy tube placement, there was inability to catheterize or define efferent jejunum to allow coaxial placement of a gastrojejunal feeding catheter. IMPRESSION: Percutaneous gastrostomy with placement of a 20-French bumper retention tube in the body of the stomach. This tube can be used for percutaneous feeds beginning in 24 hours after placement. As above, there was inability to catheterize the efferent jejunum to allow current placement of a gastrojejunal feeding catheter. If the patient does not tolerate gastric feeding, re-attempt at jejunal catheterization could be performed at a later time. Electronically Signed   By: Aletta Edouard M.D.   On: 02/16/2017 17:32    Anti-infectives: Anti-infectives    Start     Dose/Rate Route Frequency Ordered Stop   02/16/17 1500  ceFAZolin (ANCEF) IVPB 2g/100 mL premix     2 g 200 mL/hr over 30 Minutes Intravenous To Radiology 02/16/17 1447 02/16/17 1613   02/09/17 0600  erythromycin (EES) 400 MG/5ML suspension 200 mg     200 mg Oral Every 6 hours 02/09/17 0147     02/07/17 0000  erythromycin (EES) 400 MG/5ML suspension 400 mg  Status:  Discontinued     400 mg Oral Every 6 hours 02/07/17 1932 02/09/17 0147   02/06/17 0600  erythromycin ethylsuccinate (EES) 200 MG/5ML suspension 400 mg  Status:  Discontinued     400 mg Oral Every 6 hours 02/06/17 0324 02/07/17 1932       Assessment/Plan: s/p Whipple with complication of gastroparesis and persistent n/v -SIPS -continue TPN for prolonged ileus. -up to chair and ambulate      - Con't Gtube to gravity  LOS: 11 days    Rosario Jacks., Anne Hahn 02/17/2017

## 2017-02-17 NOTE — Progress Notes (Signed)
[facility] Coordinator continues to follow Kaylee Mullins's hospital course to support DC to [facility] when stable. [facility] will provide TNA to pt at [facility] upon DC.  [facility] will be on standby to support when needed.  If patient discharges after hours, please call [phone].   Larry Sierras 02/17/2017, 1:22 PM

## 2017-02-18 LAB — COMPREHENSIVE METABOLIC PANEL
ALT: 42 U/L (ref 14–54)
AST: 49 U/L — ABNORMAL HIGH (ref 15–41)
Albumin: 2.8 g/dL — ABNORMAL LOW (ref 3.5–5.0)
Alkaline Phosphatase: 71 U/L (ref 38–126)
Anion gap: 4 — ABNORMAL LOW (ref 5–15)
BUN: 20 mg/dL (ref 6–20)
CO2: 24 mmol/L (ref 22–32)
Calcium: 8.7 mg/dL — ABNORMAL LOW (ref 8.9–10.3)
Chloride: 107 mmol/L (ref 101–111)
Creatinine, Ser: 0.82 mg/dL (ref 0.44–1.00)
GFR calc Af Amer: >60 mL/min (ref >60)
GFR calc non Af Amer: >60 mL/min (ref >60)
Glucose, Bld: 117 mg/dL — ABNORMAL HIGH (ref 65–99)
Potassium: 4.2 mmol/L (ref 3.5–5.1)
Sodium: 135 mmol/L (ref 135–145)
Total Bilirubin: 0.4 mg/dL (ref 0.3–1.2)
Total Protein: 6.2 g/dL — ABNORMAL LOW (ref 6.5–8.1)

## 2017-02-18 LAB — GLUCOSE, CAPILLARY
Glucose-Capillary: 103 mg/dL — ABNORMAL HIGH (ref 65–99)
Glucose-Capillary: 116 mg/dL — ABNORMAL HIGH (ref 65–99)
Glucose-Capillary: 157 mg/dL — ABNORMAL HIGH (ref 65–99)
Glucose-Capillary: 167 mg/dL — ABNORMAL HIGH (ref 65–99)
Glucose-Capillary: 221 mg/dL — ABNORMAL HIGH (ref 65–99)
Glucose-Capillary: 82 mg/dL (ref 65–99)

## 2017-02-18 LAB — PHOSPHORUS: Phosphorus: 3.2 mg/dL (ref 2.5–4.6)

## 2017-02-18 LAB — MAGNESIUM: Magnesium: 1.8 mg/dL (ref 1.7–2.4)

## 2017-02-18 MED ORDER — FAT EMULSION 20 % IV EMUL
240.0000 mL | INTRAVENOUS | Status: AC
  Administered 2017-02-18: 240 mL via INTRAVENOUS
  Filled 2017-02-18: qty 250

## 2017-02-18 MED ORDER — TRACE MINERALS CR-CU-MN-SE-ZN 10-1000-500-60 MCG/ML IV SOLN
INTRAVENOUS | Status: AC
  Administered 2017-02-18: 18:00:00 via INTRAVENOUS
  Filled 2017-02-18: qty 1914

## 2017-02-18 NOTE — Progress Notes (Signed)
PHARMACY - ADULT TOTAL PARENTERAL NUTRITION CONSULT NOTE   Pharmacy Consult:  TPN Indication: Ileus  Patient Measurements: Height: 5\' 4"  (162.6 cm) Weight: 214 lb 15.2 oz (97.5 kg) IBW/kg (Calculated) : 54.7 TPN AdjBW (KG): 63.5 Body mass index is 36.9 kg/m.  Assessment:  52 YOF with severe malnutrition related to renal cell and pancreatic cancer who presented with abdominal mass on 01/12/17.  She is s/p Whipple procedure on the same day for pancreatic cancer.  She was discharged 02/05/17 and readmitted same day for vomiting at SNF. Pharmacy consulted to manage TPN for ileus that prevented oral intake and caused significant nausea and vomiting.   GI: On TPN for prolonged ileus. Needs solution for G-tube dependence. IR placed G tube yesterday. NG output down to 200 ml on 7/3, not recorded yesterday. Erythromycin for gastroparesis. EGD on 6/27 showed no acute findings, a lot of bilious fluid. Repeat GES- no obstruction, no motility of stomach. Albumin down at 2.8, prealbumin 14.9. PRN Zofran/Phenergan (QTc 493ms on 6/17). Had BM yesterday. Endo: hypothyroid on Synthroid.  DM not on med PTA. CBGs mostly controlled (117-157) on TPN and (90-157) off TPN. Insulin requirements in the past 24 hours: 15 units SSI + 70 units in TPN Lytes:  wnl exc Mg 187 (goal > 2 with ileus) Renal: SCr stable, CrCl ~35ml/min. UOP 0.7 ml/kg/hr. BUN 20. NS with 20K at 50 ml/hr. Pulm: stable on RA Cards: HTN. BP elevated, HR controlled- amlodipine, irbesartan, Toprol, nifedipine xl prn sbp > 170 AC: Lovenox for new DVT - dose appropriate, mild anemia, plts WNL Hepatobil: AST bumped to 49, others wnl and Tbili wnl. Trig 74. Neuro: hx CVA 2016 with residual L-hand and foot weakness ID: afebrile, WBC WNL - not on abx Best Practices: Lovenox full dose 90 mg q12 for new DVT TPN Access: triple lumen PICC placed 6/11 - replaced 02/02/17 TPN start date: 01/23/17   Nutritional Goals (per RD recommendation on 7/3): 1800-1950  kCal 82-93 g protein per day  Current Nutrition:  TPN + IV lipid emulsion  Plan:   Continue cyclic Clinimix E 5/80. Infuse 1914 mL over 14 hrs: 75 mL/hr x 1 hr, then 147 mL/hr x 12 hrs, then 75 mL/hr x 1 hr. Continue 20% lipid emulsion at 20 ml/hr over 12 hrs Continue NS with 80mEq of KCl at 50 ml/hr TPN and IV lipid emulsion provides 96 g of protein and 1839 kCals per day meeting 100% of protein and kCal needs Continue MVI in TPN Continue TE in TPN every other day, next 7/5 Continue 70 units of regular insulin in TPN Continue resistant cyclic SSI and adjust as needed Monitor TPN labs F/U SNF placement, ability to start TFs once ileus resolved   Sloan Leiter, PharmD, BCPS Clinical Pharmacist Clinical Phone 02/18/2017 until 3:30 PM - #[phone] After hours, please call #[phone] 02/18/2017 9:13 AM

## 2017-02-18 NOTE — Progress Notes (Signed)
Referring Physician(s):  Dr. Stark Klein  Supervising Physician: Sandi Mariscal  Patient Status:  [facility] - In-pt  Chief Complaint:  Gastroparesis  Subjective: Patient reports soreness at insertion site, but ok.  Asking about her anatomy s/p operation and where her food is going.   Allergies: Hydralazine hcl; Darvon [propoxyphene hcl]; Nyquil multi-symptom [pseudoeph-doxylamine-dm-apap]; and Metformin and related  Medications: Prior to Admission medications   Medication Sig Start Date End Date Taking? Authorizing Provider  acetaminophen (TYLENOL) 500 MG tablet Take 500-1,000 mg by mouth every 6 (six) hours as needed for mild pain or moderate pain (depends on pain if takes 1-2 tablets).   Yes [provider]  albuterol (PROVENTIL) (2.5 MG/3ML) 0.083% nebulizer solution Take 3 mLs (2.5 mg total) by nebulization every 4 (four) hours as needed for wheezing or shortness of breath. 02/04/17  Yes Stark Klein, MD  amLODipine (NORVASC) 10 MG tablet Take 1 tablet (10 mg total) by mouth daily. 02/04/17  Yes Stark Klein, MD  aspirin (ASPIRIN CHILDRENS) 81 MG chewable tablet Chew 1 tablet (81 mg total) by mouth daily. 02/04/17 02/04/18 Yes Stark Klein, MD  enoxaparin (LOVENOX) 100 MG/ML injection Inject 0.9 mLs (90 mg total) into the skin every 12 (twelve) hours. 02/04/17  Yes Stark Klein, MD  erythromycin ethylsuccinate (EES) 200 MG/5ML suspension Take 10 mLs (400 mg total) by mouth every 6 (six) hours. 02/04/17  Yes Stark Klein, MD  furosemide (LASIX) 40 MG tablet Take 40 mg by mouth daily as needed (leg swelling).   Yes [provider]  insulin aspart (NOVOLOG) 100 UNIT/ML injection Inject 0-15 Units into the skin every 4 (four) hours. 02/04/17  Yes Stark Klein, MD  irbesartan (AVAPRO) 300 MG tablet Take 1 tablet (300 mg total) by mouth daily. 02/04/17  Yes Stark Klein, MD  levothyroxine (SYNTHROID, LEVOTHROID) 200 MCG tablet Take 200 mcg by mouth daily before breakfast. For  hypothyroidism 11/11/15  Yes [provider]  metoprolol succinate (TOPROL-XL) 100 MG 24 hr tablet Take 100 mg by mouth daily. Take with or immediately following a meal.    Yes [provider]  NIFEdipine (PROCARDIA-XL/ADALAT CC) 30 MG 24 hr tablet Take 1 tablet (30 mg total) by mouth 2 (two) times daily as needed (systolic BP over 518 mmHg). 02/04/17  Yes Stark Klein, MD  oxyCODONE (ROXICODONE) 5 MG/5ML solution Take 5-10 mLs (5-10 mg total) by mouth every 4 (four) hours as needed for moderate pain or severe pain. 02/02/17  Yes Stark Klein, MD  oxyCODONE-acetaminophen (PERCOCET/ROXICET) 5-325 MG tablet Take 1-2 tablets by mouth every 4 (four) hours as needed for moderate pain. Patient taking differently: Take 0.5-1 tablets by mouth every 4 (four) hours as needed for moderate pain. Depends on pain if takes 0.5-1 tablet 02/14/16  Yes Elsner, Mallie Mussel, MD  pantoprazole (PROTONIX) 40 MG tablet Take 1 tablet (40 mg total) by mouth at bedtime. 02/04/17  Yes Stark Klein, MD  potassium chloride SA (K-DUR,KLOR-CON) 20 MEQ tablet Take 20 mEq by mouth 2 (two) times daily.   Yes [provider]  rosuvastatin (CRESTOR) 20 MG tablet Take 20 mg by mouth daily.  05/02/16  Yes [provider]  telmisartan-hydrochlorothiazide (MICARDIS HCT) 80-25 MG tablet Take 1 tablet by mouth daily.  05/01/16  Yes [provider]     Vital Signs: BP (!) 157/81   Pulse 76   Temp 99.1 F (37.3 C) (Oral)   Resp 16   Ht 5' 4"  (1.626 m)   Wt 214 lb  15.2 oz (97.5 kg)   SpO2 99%   BMI 36.90 kg/m   Physical Exam  NAD, alert Abd: G-tube place.  Insertion site clean and dry.  [place] pulled to skin.  Trace amount of old blood.  Non-tender to palpation.  Some bloating.   Imaging: Ir Gastrostomy Tube Mod Sed  Result Date: 02/16/2017 CLINICAL DATA:  Status post prior Whipple procedure for pancreatic carcinoma with gastroparesis and persistent nausea and vomiting. EXAM: PERCUTANEOUS  GASTROSTOMY TUBE PLACEMENT ANESTHESIA/SEDATION: 3.0 mg IV Versed; 100 mcg IV Fentanyl. Total Moderate Sedation Time 25 minutes. The patient's level of consciousness and physiologic status were continuously monitored during the procedure by Radiology nursing. CONTRAST:  30 mL Isovue-300 MEDICATIONS: 2 g IV Ancef. IV antibiotic was administered in an appropriate time interval prior to needle puncture of the skin. FLUOROSCOPY TIME:  11 minutes and 24 seconds.  493 mGy. PROCEDURE: The procedure, risks, benefits, and alternatives were explained to the patient. Questions regarding the procedure were encouraged and answered. The patient understands and consents to the procedure. A time-out was performed prior to initiating the procedure. A 5-French catheter was then advanced through the the patient's mouth under fluoroscopy into the esophagus and to the level of the stomach. This catheter was used to insufflate the stomach with air under fluoroscopy. The abdominal wall was prepped with chlorhexidine in a sterile fashion, and a sterile drape was applied covering the operative field. A sterile gown and sterile gloves were used for the procedure. Local anesthesia was provided with 1% Lidocaine. A skin incision was made in the upper abdominal wall. Under fluoroscopy, an 18 gauge trocar needle was advanced into the stomach. Contrast injection was performed to confirm intraluminal position of the needle tip. A single T tack was then deployed in the lumen of the stomach. This was brought up to tension at the skin surface. Over a guidewire, a 9-French sheath was advanced into the lumen of the stomach. The wire was left in place as a safety wire. A loop snare device from a percutaneous gastrostomy kit was then advanced into the stomach. A floppy guide wire was advanced through the orogastric catheter under fluoroscopy in the stomach. The loop snare advanced through the percutaneous gastric access was used to snare the guide wire.  This allowed withdrawal of the loop snare out of the patient's mouth by retraction of the orogastric catheter and wire. A 20-French bumper retention gastrostomy tube was looped around the snare device. It was then pulled back through the patient's mouth. The retention bumper was brought up to the anterior gastric wall. The T tack suture was cut at the skin. After initial gastrostomy tube placement, a coaxial 5 [place] multipurpose catheter was advanced through the gastrostomy tube and utilized in attempted catheterization of the jejunum. Contrast reaches injected through the 5 French catheter and guidewires advanced through the catheter. The exiting gastrostomy tube was cut to appropriate length and a feeding adapter applied. The catheter was injected with contrast material to confirm position and a fluoroscopic spot image saved. The tube was then flushed with saline. A dressing was applied over the gastrostomy exit site. COMPLICATIONS: None. FINDINGS: The stomach distended well with air allowing safe placement of the gastrostomy tube. After placement, the tip of the gastrostomy tube lies in the body of the stomach. After gastrostomy tube placement, there was inability to catheterize or define efferent jejunum to allow coaxial placement of a gastrojejunal feeding catheter. IMPRESSION: Percutaneous gastrostomy with placement of a 20-French bumper retention  tube in the body of the stomach. This tube can be used for percutaneous feeds beginning in 24 hours after placement. As above, there was inability to catheterize the efferent jejunum to allow current placement of a gastrojejunal feeding catheter. If the patient does not tolerate gastric feeding, re-attempt at jejunal catheterization could be performed at a later time. Electronically Signed   By: Aletta Edouard M.D.   On: 02/16/2017 17:32    Labs:  CBC:  Recent Labs  02/08/17 0427 02/13/17 0500 02/15/17 0442 02/16/17 0456  WBC 6.5 7.7 8.3 7.1  HGB 9.1*  9.4* 9.4* 9.4*  HCT 29.4* 29.2* 29.7* 29.9*  PLT 193 184 227 244    COAGS:  Recent Labs  01/04/17 1308 01/13/17 0423 01/18/17 1707 02/16/17 0456  INR 1.20 1.55 1.31 1.24  APTT  --   --   --  42*    BMP:  Recent Labs  02/12/17 0700 02/13/17 0500 02/15/17 0442 02/18/17 0410  NA 136 136 136 135  K 4.2 4.2 4.2 4.2  CL 109 106 107 107  CO2 22 23 23 24   GLUCOSE 144* 175* 140* 117*  BUN 27* 19 22* 20  CALCIUM 8.8* 8.8* 8.7* 8.7*  CREATININE 0.91 0.81 0.77 0.82  GFRNONAA 59* >60 >60 >60  GFRAA >60 >60 >60 >60    LIVER FUNCTION TESTS:  Recent Labs  02/08/17 0427 02/11/17 0407 02/15/17 0442 02/18/17 0410  BILITOT 0.5 0.4 0.5 0.4  AST 20 17 28  49*  ALT 18 14 23  42  ALKPHOS 59 60 70 71  PROT 6.7 6.5 6.6 6.2*  ALBUMIN 3.5 3.3* 3.2* 2.8*    Assessment and Plan: Patient with malnutrition s/p Whipple procedure for pancreatic and renal cell cancer.   Patient s/p gastrostomy placement 7/3.  Insertion site clean and dry.  No erythema or warmth.  Skin intact.  Tube in use.  IR available if needed.   Electronically Signed: Docia Barrier, PA 02/18/2017, 11:47 AM   I spent a total of 15 Minutes at the the patient's bedside AND on the patient's hospital floor or unit, greater than 50% of which was counseling/coordinating care for malnutrition.

## 2017-02-18 NOTE — Progress Notes (Signed)
8 Days Post-Op   Subjective/Chief Complaint: Got G tube, no n/v.  G tube putting out bilious material.    Objective: Vital signs in last 24 hours: Temp:  [98.7 F (37.1 C)-99.4 F (37.4 C)] 99.2 F (37.3 C) (07/05 0646) Pulse Rate:  [76-87] 78 (07/05 0646) Resp:  [16-18] 16 (07/05 0646) BP: (148-175)/(50-73) 167/56 (07/05 0646) SpO2:  [97 %-99 %] 99 % (07/05 0646) Last BM Date: 02/17/17  Intake/Output from previous day: 07/04 0701 - 07/05 0700 In: 2951 [P.O.:50; I.V.:2881] Out: 2350 [Urine:1675; Drains:675] Intake/Output this shift: No intake/output data recorded.  General appearance: alert and cooperative GI: soft, non-tender; well healed incision.G tube in place.   Lab Results:   Recent Labs  02/16/17 0456  WBC 7.1  HGB 9.4*  HCT 29.9*  PLT 244   BMET  Recent Labs  02/18/17 0410  NA 135  K 4.2  CL 107  CO2 24  GLUCOSE 117*  BUN 20  CREATININE 0.82  CALCIUM 8.7*   PT/INR  Recent Labs  02/16/17 0456  LABPROT 15.6*  INR 1.24   ABG No results for input(s): PHART, HCO3 in the last 72 hours.  Invalid input(s): PCO2, PO2  Studies/Results: Ir Gastrostomy Tube Mod Sed  Result Date: 02/16/2017 CLINICAL DATA:  Status post prior Whipple procedure for pancreatic carcinoma with gastroparesis and persistent nausea and vomiting. EXAM: PERCUTANEOUS GASTROSTOMY TUBE PLACEMENT ANESTHESIA/SEDATION: 3.0 mg IV Versed; 100 mcg IV Fentanyl. Total Moderate Sedation Time 25 minutes. The patient's level of consciousness and physiologic status were continuously monitored during the procedure by Radiology nursing. CONTRAST:  30 mL Isovue-300 MEDICATIONS: 2 g IV Ancef. IV antibiotic was administered in an appropriate time interval prior to needle puncture of the skin. FLUOROSCOPY TIME:  11 minutes and 24 seconds.  493 mGy. PROCEDURE: The procedure, risks, benefits, and alternatives were explained to the patient. Questions regarding the procedure were encouraged and answered. The  patient understands and consents to the procedure. A time-out was performed prior to initiating the procedure. A 5-French catheter was then advanced through the the patient's mouth under fluoroscopy into the esophagus and to the level of the stomach. This catheter was used to insufflate the stomach with air under fluoroscopy. The abdominal wall was prepped with chlorhexidine in a sterile fashion, and a sterile drape was applied covering the operative field. A sterile gown and sterile gloves were used for the procedure. Local anesthesia was provided with 1% Lidocaine. A skin incision was made in the upper abdominal wall. Under fluoroscopy, an 18 gauge trocar needle was advanced into the stomach. Contrast injection was performed to confirm intraluminal position of the needle tip. A single T tack was then deployed in the lumen of the stomach. This was brought up to tension at the skin surface. Over a guidewire, a 9-French sheath was advanced into the lumen of the stomach. The wire was left in place as a safety wire. A loop snare device from a percutaneous gastrostomy kit was then advanced into the stomach. A floppy guide wire was advanced through the orogastric catheter under fluoroscopy in the stomach. The loop snare advanced through the percutaneous gastric access was used to snare the guide wire. This allowed withdrawal of the loop snare out of the patient's mouth by retraction of the orogastric catheter and wire. A 20-French bumper retention gastrostomy tube was looped around the snare device. It was then pulled back through the patient's mouth. The retention bumper was brought up to the anterior gastric wall. The  T tack suture was cut at the skin. After initial gastrostomy tube placement, a coaxial 5 [place] multipurpose catheter was advanced through the gastrostomy tube and utilized in attempted catheterization of the jejunum. Contrast reaches injected through the 5 French catheter and guidewires advanced through  the catheter. The exiting gastrostomy tube was cut to appropriate length and a feeding adapter applied. The catheter was injected with contrast material to confirm position and a fluoroscopic spot image saved. The tube was then flushed with saline. A dressing was applied over the gastrostomy exit site. COMPLICATIONS: None. FINDINGS: The stomach distended well with air allowing safe placement of the gastrostomy tube. After placement, the tip of the gastrostomy tube lies in the body of the stomach. After gastrostomy tube placement, there was inability to catheterize or define efferent jejunum to allow coaxial placement of a gastrojejunal feeding catheter. IMPRESSION: Percutaneous gastrostomy with placement of a 20-French bumper retention tube in the body of the stomach. This tube can be used for percutaneous feeds beginning in 24 hours after placement. As above, there was inability to catheterize the efferent jejunum to allow current placement of a gastrojejunal feeding catheter. If the patient does not tolerate gastric feeding, re-attempt at jejunal catheterization could be performed at a later time. Electronically Signed   By: Aletta Edouard M.D.   On: 02/16/2017 17:32    Anti-infectives: Anti-infectives    Start     Dose/Rate Route Frequency Ordered Stop   02/16/17 1500  ceFAZolin (ANCEF) IVPB 2g/100 mL premix     2 g 200 mL/hr over 30 Minutes Intravenous To Radiology 02/16/17 1447 02/16/17 1613   02/09/17 0600  erythromycin (EES) 400 MG/5ML suspension 200 mg     200 mg Oral Every 6 hours 02/09/17 0147     02/07/17 0000  erythromycin (EES) 400 MG/5ML suspension 400 mg  Status:  Discontinued     400 mg Oral Every 6 hours 02/07/17 1932 02/09/17 0147   02/06/17 0600  erythromycin ethylsuccinate (EES) 200 MG/5ML suspension 400 mg  Status:  Discontinued     400 mg Oral Every 6 hours 02/06/17 0324 02/07/17 1932      Assessment/Plan: s/p Whipple with complication of gastroparesis and persistent  n/v -SIPS -continue TPN for prolonged ileus. -up to chair and ambulate      - Con't Gtube to gravity Look for SNF.   LOS: 12 days    Jearlene Bridwell 02/18/2017

## 2017-02-19 ENCOUNTER — Ambulatory Visit: Payer: Medicare Other | Admitting: Nurse Practitioner

## 2017-02-19 LAB — GLUCOSE, CAPILLARY
Glucose-Capillary: 119 mg/dL — ABNORMAL HIGH (ref 65–99)
Glucose-Capillary: 121 mg/dL — ABNORMAL HIGH (ref 65–99)
Glucose-Capillary: 188 mg/dL — ABNORMAL HIGH (ref 65–99)
Glucose-Capillary: 196 mg/dL — ABNORMAL HIGH (ref 65–99)
Glucose-Capillary: 202 mg/dL — ABNORMAL HIGH (ref 65–99)
Glucose-Capillary: 81 mg/dL (ref 65–99)

## 2017-02-19 MED ORDER — OXYCODONE HCL 5 MG/5ML PO SOLN
5.0000 mg | ORAL | Status: DC | PRN
  Administered 2017-02-21 – 2017-03-22 (×11): 10 mg via ORAL
  Filled 2017-02-19 (×15): qty 10

## 2017-02-19 MED ORDER — HYDROMORPHONE HCL 1 MG/ML IJ SOLN
1.0000 mg | INTRAMUSCULAR | Status: DC | PRN
  Administered 2017-02-20 – 2017-03-10 (×82): 1 mg via INTRAVENOUS
  Filled 2017-02-19 (×87): qty 1

## 2017-02-19 MED ORDER — M.V.I. ADULT IV INJ
INTRAVENOUS | Status: AC
  Administered 2017-02-19: 18:00:00 via INTRAVENOUS
  Filled 2017-02-19: qty 1914

## 2017-02-19 MED ORDER — FAT EMULSION 20 % IV EMUL
240.0000 mL | INTRAVENOUS | Status: AC
  Administered 2017-02-19: 240 mL via INTRAVENOUS
  Filled 2017-02-19: qty 250

## 2017-02-19 MED ORDER — MAGNESIUM SULFATE 2 GM/50ML IV SOLN
2.0000 g | Freq: Once | INTRAVENOUS | Status: AC
  Administered 2017-02-19: 2 g via INTRAVENOUS
  Filled 2017-02-19: qty 50

## 2017-02-19 NOTE — Clinical Social Work Note (Addendum)
CSW [facility] social work consult for "Still on TNA, but now with G tube to gravity to minimize n/v." CSW updated Nikki at [employer] about pt needs. Per MD notes, pt may receive GJ tube Monday/tuesday. CSW continuing to follow for disposition needs.   [name], [place], [place] Work  [phone]

## 2017-02-19 NOTE — Progress Notes (Signed)
PHARMACY - ADULT TOTAL PARENTERAL NUTRITION CONSULT NOTE   Pharmacy Consult:  TPN Indication: Ileus  Patient Measurements: Height: 5\' 4"  (162.6 cm) Weight: 214 lb 15.2 oz (97.5 kg) IBW/kg (Calculated) : 54.7 TPN AdjBW (KG): 63.5 Body mass index is 36.9 kg/m.  Assessment:  41 YOF with severe malnutrition related to renal cell and pancreatic cancer who presented with abdominal mass on 01/12/17.  She is s/p Whipple procedure on the same day for pancreatic cancer.  She was discharged 02/05/17 and readmitted same day for vomiting at SNF. Pharmacy consulted to manage TPN for ileus that prevented oral intake and caused significant nausea and vomiting.   GI: On TPN for prolonged ileus. Needs solution for G-tube dependence. IR placed G tube yesterday. NG output down to 200 ml on 7/3, not recorded yesterday or today. Erythromycin for gastroparesis. EGD on 6/27 showed no acute findings, a lot of bilious fluid. Repeat GES- no obstruction, no motility of stomach. Albumin down at 2.8, prealbumin 14.9. PRN Zofran/Phenergan (QTc 473ms on 6/17). Had BM yesterday- small, mucous like. Some flatus. Plan for possible GJ tube Monday or Tuesday.  Endo: hypothyroid on Synthroid.  DM not on med PTA. CBGs mostly controlled (622-633) on TPN and (82-167) off TPN. Patient was not symptomatic with the low of 82 per RN.  Insulin requirements in the past 24 hours: 18 units SSI + 70 units in TPN Lytes:  wnl exc Mg 1.8 (goal > 2 with ileus) Renal: SCr stable, CrCl ~57ml/min. UOP 0.8 ml/kg/hr. BUN 20. NS with 20K at 50 ml/hr. -1.5L last 24hrs (net + 2.6L).  Pulm: stable on RA Cards: HTN. BP elevated, HR controlled- amlodipine, irbesartan, Toprol, nifedipine xl prn sbp > 170 AC: Lovenox for new DVT - dose appropriate, mild anemia, plts WNL Hepatobil: AST bumped to 49, others wnl and Tbili wnl. Trig 74. 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 Infuse 1914 mL over 14 hrs: 75 mL/hr x 1 hr, then 147 mL/hr x 12 hrs, then 75 mL/hr x 1 hr. Continue 20% lipid emulsion at 20 ml/hr over 12 hrs Continue NS with 42mEq of KCl at 50 ml/hr TPN and IV lipid emulsion provides 96 g of protein and 1839 kCals per day meeting 100% of protein and kCal needs Continue MVI in TPN Continue TE in TPN every other day, next 7/7 Continue 70 units of regular insulin in TPN Continue resistant cyclic SSI and adjust as needed Watch CBGs when OFF TPN. Monitor TPN labs F/U SNF placement, ability to start TFs once ileus resolved  Give Magnesium 2g IV x1.   Sloan Leiter, PharmD, BCPS Clinical Pharmacist Clinical Phone 02/19/2017 until 3:30 PM - [phone] After hours, please call #[phone] 02/19/2017 12:07 PM

## 2017-02-19 NOTE — Progress Notes (Signed)
Patient ID: Kaylee Mullins, female   DOB: 06-12-1936, 81 y.o.   MRN: [id number]  9 Days Post-Op   Subjective/Chief Complaint: Some flatus.  Small mucous-like bowel movements.    Objective: Vital signs in last 24 hours: Temp:  [98.5 F (36.9 C)-99.4 F (37.4 C)] 99.3 F (37.4 C) (07/06 0755) Pulse Rate:  [74-87] 86 (07/06 0755) Resp:  [17-18] 18 (07/06 0755) BP: (151-178)/(58-96) 178/96 (07/06 0755) SpO2:  [97 %-100 %] 100 % (07/06 0755) Last BM Date: 02/17/17  Intake/Output from previous day: 07/05 0701 - 07/06 0700 In: 1020 [I.V.:1020] Out: 2550 [Urine:1850; Drains:700] Intake/Output this shift: Total I/O In: 10 [I.V.:10] Out: -   General appearance: alert and cooperative GI: soft, non-tender; well healed incision.G tube in place with bile.   Lab Results:  No results for input(s): WBC, HGB, HCT, PLT in the last 72 hours. BMET  Recent Labs  02/18/17 0410  NA 135  K 4.2  CL 107  CO2 24  GLUCOSE 117*  BUN 20  CREATININE 0.82  CALCIUM 8.7*   PT/INR No results for input(s): LABPROT, INR in the last 72 hours. ABG No results for input(s): PHART, HCO3 in the last 72 hours.  Invalid input(s): PCO2, PO2  Studies/Results: No results found.  Anti-infectives: Anti-infectives    Start     Dose/Rate Route Frequency Ordered Stop   02/16/17 1500  ceFAZolin (ANCEF) IVPB 2g/100 mL premix     2 g 200 mL/hr over 30 Minutes Intravenous To Radiology 02/16/17 1447 02/16/17 1613   02/09/17 0600  erythromycin (EES) 400 MG/5ML suspension 200 mg     200 mg Oral Every 6 hours 02/09/17 0147     02/07/17 0000  erythromycin (EES) 400 MG/5ML suspension 400 mg  Status:  Discontinued     400 mg Oral Every 6 hours 02/07/17 1932 02/09/17 0147   02/06/17 0600  erythromycin ethylsuccinate (EES) 200 MG/5ML suspension 400 mg  Status:  Discontinued     400 mg Oral Every 6 hours 02/06/17 0324 02/07/17 1932      Assessment/Plan: s/p Whipple with complication of gastroparesis and  persistent n/v -SIPS -continue TPN for prolonged ileus. -up to chair and ambulate      - Con't Gtube to gravity Will see if GI or IR can attempt GJ tube Monday/tuesday. On cycled TNA  Look for SNF.   LOS: 13 days    Kennie Karapetian 02/19/2017

## 2017-02-20 LAB — GLUCOSE, CAPILLARY
Glucose-Capillary: 107 mg/dL — ABNORMAL HIGH (ref 65–99)
Glucose-Capillary: 132 mg/dL — ABNORMAL HIGH (ref 65–99)
Glucose-Capillary: 149 mg/dL — ABNORMAL HIGH (ref 65–99)
Glucose-Capillary: 157 mg/dL — ABNORMAL HIGH (ref 65–99)
Glucose-Capillary: 158 mg/dL — ABNORMAL HIGH (ref 65–99)
Glucose-Capillary: 183 mg/dL — ABNORMAL HIGH (ref 65–99)

## 2017-02-20 MED ORDER — TRACE MINERALS CR-CU-MN-SE-ZN 10-1000-500-60 MCG/ML IV SOLN
INTRAVENOUS | Status: AC
  Administered 2017-02-20: 18:00:00 via INTRAVENOUS
  Filled 2017-02-20: qty 1914

## 2017-02-20 MED ORDER — FAT EMULSION 20 % IV EMUL
240.0000 mL | INTRAVENOUS | Status: AC
  Administered 2017-02-20: 240 mL via INTRAVENOUS
  Filled 2017-02-20: qty 250

## 2017-02-20 NOTE — Progress Notes (Addendum)
PHARMACY - ADULT TOTAL PARENTERAL NUTRITION CONSULT NOTE   Pharmacy Consult:  TPN Indication: Ileus  Patient Measurements: Height: 5\' 4"  (162.6 cm) Weight: 220 lb 6.4 oz (100 kg) IBW/kg (Calculated) : 54.7 TPN AdjBW (KG): 63.5 Body mass index is 37.83 kg/m.  Assessment:  6 YOF with severe malnutrition related to renal cell and pancreatic cancer who presented with abdominal mass on 01/12/17.  She is s/p Whipple procedure on the same day for pancreatic cancer.  She was discharged 02/05/17 and readmitted same day for vomiting at SNF. Pharmacy consulted to manage TPN for ileus that prevented oral intake and caused significant nausea and vomiting.   GI: On TPN for prolonged ileus. Needs solution for G-tube dependence. IR placed G tube yesterday. NG output down to 200 ml on 7/3, not recorded yesterday or today. Erythromycin for gastroparesis. EGD on 6/27 showed no acute findings, a lot of bilious fluid. Repeat GES- no obstruction, no motility of stomach. Albumin down at 2.8, prealbumin 14.9. PRN Zofran/Phenergan (QTc 480ms on 6/17). Had BM yesterday- small, mucous like. Some flatus. Plan for possible GJ tube Monday or Tuesday.  Endo: hypothyroid on Synthroid.  DM not on med PTA. CBGs elevated on TPN (149-202) and (81-196) off TPN. Patient does not appear symptomatic with the lows. Confirmed with RN that TPN is being cycled up and down appropriately.  Insulin requirements in the past 24 hours: 18 units SSI + 70 units in TPN Lytes:  wnl exc Mg 1.8 (goal > 2 with ileus)- replaced. Renal: SCr stable, CrCl ~85ml/min. UOP 0.8 ml/kg/hr. BUN 20. NS with 20K at 50 ml/hr. -1.5L last 24hrs (net + 2.6L).  Pulm: stable on RA Cards: HTN. BP elevated, HR controlled- amlodipine, irbesartan, Toprol, nifedipine xl prn sbp > 170 AC: Lovenox for new DVT - dose appropriate, mild anemia, plts WNL Hepatobil: AST bumped to 49, others wnl and Tbili wnl. Trig 74. Neuro: hx CVA 2016 with residual L-hand and foot  weakness ID: afebrile, WBC WNL - not on abx Best Practices: Lovenox full dose 90 mg q12 for new DVT TPN Access: triple lumen PICC placed 6/11 - replaced 02/02/17 TPN start date: 01/23/17   Nutritional Goals (per RD recommendation on 7/3): 1800-1950 kCal 82-93 g protein per day  Current Nutrition:   TPN + IV lipid emulsion  Plan:   Lengthen back to cyclic Clinimix E 6/29 over 16hrs due to CBG control issues. Infuse 1914 mL over 16 hrs: 75 mL/hr x 1 hr, then 126 mL/hr x 14 hrs, then 75 mL/hr x 1 hr. Continue 20% lipid emulsion at 20 ml/hr over 12 hrs Continue NS with 41mEq of KCl at 50 ml/hr TPN and IV lipid emulsion provides 96 g of protein and 1839 kCals per day meeting 100% of protein and kCal needs Continue MVI in TPN Continue TE in TPN every other day, next 7/7 Continue 70 units of regular insulin in TPN Continue resistant cyclic SSI and adjust as needed Watch CBGs when OFF TPN. Monitor TPN labs F/U SNF placement, ability to start TFs once ileus resolved  Sloan Leiter, PharmD, BCPS Clinical Pharmacist Clinical Phone 02/20/2017 until 3:30 PM - [phone] After hours, please call #[phone] 02/20/2017 11:46 AM

## 2017-02-20 NOTE — Progress Notes (Signed)
Assessment s/p Whipple with complication of gastroparesis and persistent n/v -SIPS and chips -continue TPN for prolonged ileus. -up to chair and ambulate      - Con't Gtube to gravity   Plan:  Will see if GI or IR can attempt GJ tube Monday/tuesday. On cycled TNA  Look for SNF.   LOS: 14 days     10 Days Post-Op  Chief Complaint/Subjective: Still with nausea.  Passing a little gas.  Objective: Vital signs in last 24 hours: Temp:  [98.3 F (36.8 C)-99.2 F (37.3 C)] 98.3 F (36.8 C) (07/07 0441) Pulse Rate:  [72-84] 74 (07/07 0441) Resp:  [16-18] 17 (07/06 2033) BP: (142-173)/(61-78) 152/76 (07/07 0441) SpO2:  [97 %-100 %] 99 % (07/07 0441) Weight:  [100 kg (220 lb 6.4 oz)] 100 kg (220 lb 6.4 oz) (07/07 0932) Last BM Date: 02/17/17  Intake/Output from previous day: 07/06 0701 - 07/07 0700 In: 1502.7 [I.V.:1502.7] Out: 2800 [Urine:2300; Drains:500] Intake/Output this shift: Total I/O In: 10 [I.V.:10] Out: 100 [Urine:100]  PE: General- In NAD.  Awake and alert. Abdomen-soft, g-tube draining yellow fluid  Lab Results:  No results for input(s): WBC, HGB, HCT, PLT in the last 72 hours. BMET  Recent Labs  02/18/17 0410  NA 135  K 4.2  CL 107  CO2 24  GLUCOSE 117*  BUN 20  CREATININE 0.82  CALCIUM 8.7*   PT/INR No results for input(s): LABPROT, INR in the last 72 hours. Comprehensive Metabolic Panel:    Component Value Date/Time   NA 135 02/18/2017 0410   NA 136 02/15/2017 0442   NA 141 11/13/2016 1329   K 4.2 02/18/2017 0410   K 4.2 02/15/2017 0442   K 3.5 11/13/2016 1329   CL 107 02/18/2017 0410   CL 107 02/15/2017 0442   CO2 24 02/18/2017 0410   CO2 23 02/15/2017 0442   CO2 24 11/13/2016 1329   BUN 20 02/18/2017 0410   BUN 22 (H) 02/15/2017 0442   BUN 18.7 11/13/2016 1329   CREATININE 0.82 02/18/2017 0410   CREATININE 0.77 02/15/2017 0442   CREATININE 1.0 11/13/2016 1329   GLUCOSE 117 (H) 02/18/2017 0410   GLUCOSE 140 (H) 02/15/2017  0442   GLUCOSE 78 11/13/2016 1329   CALCIUM 8.7 (L) 02/18/2017 0410   CALCIUM 8.7 (L) 02/15/2017 0442   CALCIUM 9.3 11/13/2016 1329   AST 49 (H) 02/18/2017 0410   AST 28 02/15/2017 0442   AST 24 11/13/2016 1329   ALT 42 02/18/2017 0410   ALT 23 02/15/2017 0442   ALT 19 11/13/2016 1329   ALKPHOS 71 02/18/2017 0410   ALKPHOS 70 02/15/2017 0442   ALKPHOS 152 (H) 11/13/2016 1329   BILITOT 0.4 02/18/2017 0410   BILITOT 0.5 02/15/2017 0442   BILITOT 1.44 (H) 11/13/2016 1329   PROT 6.2 (L) 02/18/2017 0410   PROT 6.6 02/15/2017 0442   PROT 8.0 11/13/2016 1329   ALBUMIN 2.8 (L) 02/18/2017 0410   ALBUMIN 3.2 (L) 02/15/2017 0442   ALBUMIN 3.5 11/13/2016 1329     Studies/Results: No results found.  Anti-infectives: Anti-infectives    Start     Dose/Rate Route Frequency Ordered Stop   02/16/17 1500  ceFAZolin (ANCEF) IVPB 2g/100 mL premix     2 g 200 mL/hr over 30 Minutes Intravenous To Radiology 02/16/17 1447 02/16/17 1613   02/09/17 0600  erythromycin (EES) 400 MG/5ML suspension 200 mg     200 mg Oral Every 6 hours 02/09/17 0147  02/07/17 0000  erythromycin (EES) 400 MG/5ML suspension 400 mg  Status:  Discontinued     400 mg Oral Every 6 hours 02/07/17 1932 02/09/17 0147   02/06/17 0600  erythromycin ethylsuccinate (EES) 200 MG/5ML suspension 400 mg  Status:  Discontinued     400 mg Oral Every 6 hours 02/06/17 0324 02/07/17 1932       Tyleigh Mahn J 02/20/2017

## 2017-02-21 LAB — GLUCOSE, CAPILLARY
Glucose-Capillary: 101 mg/dL — ABNORMAL HIGH (ref 65–99)
Glucose-Capillary: 107 mg/dL — ABNORMAL HIGH (ref 65–99)
Glucose-Capillary: 126 mg/dL — ABNORMAL HIGH (ref 65–99)
Glucose-Capillary: 133 mg/dL — ABNORMAL HIGH (ref 65–99)
Glucose-Capillary: 159 mg/dL — ABNORMAL HIGH (ref 65–99)
Glucose-Capillary: 165 mg/dL — ABNORMAL HIGH (ref 65–99)

## 2017-02-21 MED ORDER — M.V.I. ADULT IV INJ
INTRAVENOUS | Status: AC
  Administered 2017-02-21: 18:00:00 via INTRAVENOUS
  Filled 2017-02-21: qty 1914

## 2017-02-21 MED ORDER — FAT EMULSION 20 % IV EMUL
240.0000 mL | INTRAVENOUS | Status: AC
  Administered 2017-02-21: 240 mL via INTRAVENOUS
  Filled 2017-02-21: qty 250

## 2017-02-21 NOTE — Progress Notes (Signed)
PHARMACY - ADULT TOTAL PARENTERAL NUTRITION CONSULT NOTE   Pharmacy Consult:  TPN Indication: Ileus  Patient Measurements: Height: 5\' 4"  (162.6 cm) Weight: 220 lb 6.4 oz (100 kg) IBW/kg (Calculated) : 54.7 TPN AdjBW (KG): 63.5 Body mass index is 37.83 kg/m.  Assessment:  57 YOF with severe malnutrition related to renal cell and pancreatic cancer who presented with abdominal mass on 01/12/17.  She is s/p Whipple procedure on the same day for pancreatic cancer.  She was discharged 02/05/17 and readmitted same day for vomiting at SNF. Pharmacy consulted to manage TPN for ileus that prevented oral intake and caused significant nausea and vomiting.   GI: On TPN for prolonged ileus. G-tube placed by IR. NG output down to 200 ml on 7/3, not recorded yesterday or today. Erythromycin for gastroparesis. EGD on 6/27 showed no acute findings, a lot of bilious fluid. Repeat GES- no obstruction, no motility of stomach. Albumin down at 2.8, prealbumin 14.9. PRN Zofran/Phenergan (QTc 470ms on 6/17). Had BM 7/6- small, mucous like. Some flatus. Plan for possible GJ tube Monday or Tuesday.  Endo: hypothyroid on Synthroid.  DM not on med PTA. CBGs much improved (no highs or lows) ranging 107-159 on and off TPN with extending out to 16 hours. Confirmed with RN that TPN is being cycled appropriately.  Insulin requirements in the past 24 hours: 14 units SSI + 70 units in TPN Lytes:  wnl exc Mg 1.8 (goal > 2 with ileus)- replaced. Renal: SCr stable, CrCl ~28ml/min. UOP 0.6 ml/kg/hr. BUN 20. NS with 20K at 50 ml/hr. -2L last 24hrs (net -829ml).  Pulm: stable on RA Cards: HTN. BP elevated (164/77), HR controlled- amlodipine, irbesartan, Toprol, nifedipine xl prn sbp > 170 AC: Lovenox for new DVT - dose appropriate, mild anemia, plts WNL Hepatobil: AST bumped to 49, others wnl and Tbili wnl. Trig 74. 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 Dilaudid.  ID: afebrile, WBC WNL - not on abx Best  Practices: Lovenox full dose 90 mg q12 for new DVT TPN Access: triple lumen PICC placed 6/11 - replaced 02/02/17 TPN start date: 01/23/17   Nutritional Goals (per RD recommendation on 7/3): 1800-1950 kCal 82-93 g protein per day  Current Nutrition: Sips and ice chips   TPN + IV lipid emulsion  Plan:   Continue cyclic Clinimix E 7/61 over 16 hrs: Infuse 1914 mL over 16 hrs: 75 mL/hr x 1 hr, then 126 mL/hr x 14 hrs, then 75 mL/hr x 1 hr. Continue 20% lipid emulsion at 20 ml/hr over 12 hrs Continue NS with 32mEq of KCl at 50 ml/hr per MD TPN and IV lipid emulsion provides 96 g of protein and 1839 kCals per day meeting 100% of protein and kCal needs Continue MVI in TPN Continue TE in TPN every other day, next 7/9 Increase 75 units of regular insulin in TPN  Continue resistant cyclic SSI and adjust as needed Watch CBGs closely. Monitor TPN labs F/U SNF placement, ability to start TFs once ileus resolved  Sloan Leiter, PharmD, BCPS Clinical Pharmacist Clinical Phone 02/21/2017 until 3:30 PM - [phone] After hours, please call #[phone] 02/21/2017 10:52 AM

## 2017-02-21 NOTE — Progress Notes (Signed)
Assessment s/p Whipple with complication of gastroparesis and persistent n/v -SIPS and chips -continue TPN for prolonged ileus. -up to chair and ambulate      - Con't Gtube to gravity   Plan:  Will see if GI or IR can attempt GJ tube Monday/tuesday. On cycled TNA     LOS: 15 days     11 Days Post-Op  Chief Complaint/Subjective: No complaints today.  She reports she not comfortable with the [facility] facility and wonders if there is somewhere else she can go.  Objective: Vital signs in last 24 hours: Temp:  [98.4 F (36.9 C)-99 F (37.2 C)] 98.4 F (36.9 C) (07/08 0406) Pulse Rate:  [70-86] 70 (07/08 0406) Resp:  [16] 16 (07/08 0406) BP: (150-164)/(58-77) 164/77 (07/08 0406) SpO2:  [98 %-100 %] 98 % (07/08 0406) Last BM Date: 02/17/17  Intake/Output from previous day: 07/07 0701 - 07/08 0700 In: 630 [I.V.:490] Out: 2625 [Urine:1500; Drains:1125] Intake/Output this shift: Total I/O In: -  Out: 750 [Urine:750]  PE: General- In NAD.  Awake and alert. Abdomen-soft, g-tube draining yellow fluid  Lab Results:  No results for input(s): WBC, HGB, HCT, PLT in the last 72 hours. BMET No results for input(s): NA, K, CL, CO2, GLUCOSE, BUN, CREATININE, CALCIUM in the last 72 hours. PT/INR No results for input(s): LABPROT, INR in the last 72 hours. Comprehensive Metabolic Panel:    Component Value Date/Time   NA 135 02/18/2017 0410   NA 136 02/15/2017 0442   NA 141 11/13/2016 1329   K 4.2 02/18/2017 0410   K 4.2 02/15/2017 0442   K 3.5 11/13/2016 1329   CL 107 02/18/2017 0410   CL 107 02/15/2017 0442   CO2 24 02/18/2017 0410   CO2 23 02/15/2017 0442   CO2 24 11/13/2016 1329   BUN 20 02/18/2017 0410   BUN 22 (H) 02/15/2017 0442   BUN 18.7 11/13/2016 1329   CREATININE 0.82 02/18/2017 0410   CREATININE 0.77 02/15/2017 0442   CREATININE 1.0 11/13/2016 1329   GLUCOSE 117 (H) 02/18/2017 0410   GLUCOSE 140 (H) 02/15/2017 0442   GLUCOSE 78 11/13/2016 1329   CALCIUM 8.7 (L) 02/18/2017 0410   CALCIUM 8.7 (L) 02/15/2017 0442   CALCIUM 9.3 11/13/2016 1329   AST 49 (H) 02/18/2017 0410   AST 28 02/15/2017 0442   AST 24 11/13/2016 1329   ALT 42 02/18/2017 0410   ALT 23 02/15/2017 0442   ALT 19 11/13/2016 1329   ALKPHOS 71 02/18/2017 0410   ALKPHOS 70 02/15/2017 0442   ALKPHOS 152 (H) 11/13/2016 1329   BILITOT 0.4 02/18/2017 0410   BILITOT 0.5 02/15/2017 0442   BILITOT 1.44 (H) 11/13/2016 1329   PROT 6.2 (L) 02/18/2017 0410   PROT 6.6 02/15/2017 0442   PROT 8.0 11/13/2016 1329   ALBUMIN 2.8 (L) 02/18/2017 0410   ALBUMIN 3.2 (L) 02/15/2017 0442   ALBUMIN 3.5 11/13/2016 1329     Studies/Results: No results found.  Anti-infectives: Anti-infectives    Start     Dose/Rate Route Frequency Ordered Stop   02/16/17 1500  ceFAZolin (ANCEF) IVPB 2g/100 mL premix     2 g 200 mL/hr over 30 Minutes Intravenous To Radiology 02/16/17 1447 02/16/17 1613   02/09/17 0600  erythromycin (EES) 400 MG/5ML suspension 200 mg     200 mg Oral Every 6 hours 02/09/17 0147     02/07/17 0000  erythromycin (EES) 400 MG/5ML suspension 400 mg  Status:  Discontinued  400 mg Oral Every 6 hours 02/07/17 1932 02/09/17 0147   02/06/17 0600  erythromycin ethylsuccinate (EES) 200 MG/5ML suspension 400 mg  Status:  Discontinued     400 mg Oral Every 6 hours 02/06/17 0324 02/07/17 1932       Hertha Gergen J 02/21/2017

## 2017-02-22 LAB — GLUCOSE, CAPILLARY
Glucose-Capillary: 100 mg/dL — ABNORMAL HIGH (ref 65–99)
Glucose-Capillary: 107 mg/dL — ABNORMAL HIGH (ref 65–99)
Glucose-Capillary: 129 mg/dL — ABNORMAL HIGH (ref 65–99)
Glucose-Capillary: 137 mg/dL — ABNORMAL HIGH (ref 65–99)
Glucose-Capillary: 242 mg/dL — ABNORMAL HIGH (ref 65–99)
Glucose-Capillary: 95 mg/dL (ref 65–99)
Glucose-Capillary: 98 mg/dL (ref 65–99)

## 2017-02-22 LAB — COMPREHENSIVE METABOLIC PANEL
ALT: 40 U/L (ref 14–54)
AST: 40 U/L (ref 15–41)
Albumin: 2.7 g/dL — ABNORMAL LOW (ref 3.5–5.0)
Alkaline Phosphatase: 82 U/L (ref 38–126)
Anion gap: 4 — ABNORMAL LOW (ref 5–15)
BUN: 19 mg/dL (ref 6–20)
CO2: 26 mmol/L (ref 22–32)
Calcium: 8.5 mg/dL — ABNORMAL LOW (ref 8.9–10.3)
Chloride: 106 mmol/L (ref 101–111)
Creatinine, Ser: 0.87 mg/dL (ref 0.44–1.00)
GFR calc Af Amer: >60 mL/min (ref >60)
GFR calc non Af Amer: >60 mL/min (ref >60)
Glucose, Bld: 94 mg/dL (ref 65–99)
Potassium: 4.3 mmol/L (ref 3.5–5.1)
Sodium: 136 mmol/L (ref 135–145)
Total Bilirubin: 0.4 mg/dL (ref 0.3–1.2)
Total Protein: 6.7 g/dL (ref 6.5–8.1)

## 2017-02-22 LAB — DIFFERENTIAL
Basophils Absolute: 0 10*3/uL (ref 0.0–0.1)
Basophils Relative: 0 %
Eosinophils Absolute: 0.3 10*3/uL (ref 0.0–0.7)
Eosinophils Relative: 4 %
Lymphocytes Relative: 35 %
Lymphs Abs: 2.6 10*3/uL (ref 0.7–4.0)
Monocytes Absolute: 0.5 10*3/uL (ref 0.1–1.0)
Monocytes Relative: 6 %
Neutro Abs: 3.9 10*3/uL (ref 1.7–7.7)
Neutrophils Relative %: 55 %

## 2017-02-22 LAB — CBC
HCT: 30.4 % — ABNORMAL LOW (ref 36.0–46.0)
Hemoglobin: 9.5 g/dL — ABNORMAL LOW (ref 12.0–15.0)
MCH: 30.7 pg (ref 26.0–34.0)
MCHC: 31.3 g/dL (ref 30.0–36.0)
MCV: 98.4 fL (ref 78.0–100.0)
Platelets: 402 10*3/uL — ABNORMAL HIGH (ref 150–400)
RBC: 3.09 MIL/uL — ABNORMAL LOW (ref 3.87–5.11)
RDW: 15 % (ref 11.5–15.5)
WBC: 7.2 10*3/uL (ref 4.0–10.5)

## 2017-02-22 LAB — MAGNESIUM: Magnesium: 1.8 mg/dL (ref 1.7–2.4)

## 2017-02-22 LAB — PREALBUMIN: Prealbumin: 12.7 mg/dL — ABNORMAL LOW (ref 18–38)

## 2017-02-22 LAB — PHOSPHORUS: Phosphorus: 3.9 mg/dL (ref 2.5–4.6)

## 2017-02-22 LAB — TRIGLYCERIDES: Triglycerides: 69 mg/dL (ref <150)

## 2017-02-22 MED ORDER — FAT EMULSION 20 % IV EMUL
240.0000 mL | INTRAVENOUS | Status: AC
  Administered 2017-02-22: 240 mL via INTRAVENOUS
  Filled 2017-02-22: qty 250

## 2017-02-22 MED ORDER — MAGNESIUM SULFATE 4 GM/100ML IV SOLN
4.0000 g | Freq: Once | INTRAVENOUS | Status: AC
  Administered 2017-02-22: 4 g via INTRAVENOUS
  Filled 2017-02-22: qty 100

## 2017-02-22 MED ORDER — TRACE MINERALS CR-CU-MN-SE-ZN 10-1000-500-60 MCG/ML IV SOLN
INTRAVENOUS | Status: AC
  Administered 2017-02-22: 18:00:00 via INTRAVENOUS
  Filled 2017-02-22: qty 1914

## 2017-02-22 NOTE — Care Management Note (Signed)
Case Management Note  Patient Details  Name: Kaylee Mullins MRN: [id number] Date of Birth: 08/07/1936  Subjective/Objective:    S/p Whipple with complication of gastroparesis and persistent n/v                Action/Plan: Discharge Planning: Chart review. Plan is to return to SNF post dc. Per notes, Pt is not happy with [facility]. CSW notified to follow up with pt on alternative placement. [facility] Infusion Coordinator following for TNA at SNF. [facility] pharmacy provided TNA for SNF-[facility]. Will need to follow up with [facility] if pt's dc to another facility.   PCP   Nolene Ebbs MD    Expected Discharge Date:  02/02/17               Expected Discharge Plan:  Skilled Nursing Facility  In-House Referral:  Clinical Social Work  Discharge planning Services  CM Consult  Post Acute Care Choice:  NA Choice offered to:  NA  DME Arranged:  Other see comment (TNA) DME Agency:  [place]:  NA HH Agency:  NA  Status of Service:  Completed, signed off  If discussed at [place] of Stay Meetings, dates discussed:    Additional Comments:  Erenest Rasher, RN 02/22/2017, 12:01 PM

## 2017-02-22 NOTE — Progress Notes (Signed)
Nutrition Follow-up  DOCUMENTATION CODES:   Non-severe (moderate) malnutrition in context of acute illness/injury, Obesity unspecified  INTERVENTION:   Pharmacy to manage TPN  If tube feed initiated recommend: Vital AF 1.5 @ 55 ml/hr Provides: 1980 kcals, 89 grams protein, 1003 ml free water (meets 102% calories and 100% of protein needs)  NUTRITION DIAGNOSIS:   Malnutrition related to chronic illness, acute illness as evidenced by mild depletion of muscle mass, mild depletion of body fat.  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 Had prolonged postop course due to delayed gastric emptying due to gastroparesis and intolerance of TNA, ultimately started on TPN. Represents from SNF after being discharged for less than 12 hours, due to vomiting and abdominal pain.   7/9: Pt remains NPO with sips and chips. Pt reports continuous nausea with no active vomiting. NG tube to suction remains in place, with no output recorded. Pt having small mucus like stools and passing some flatus. Surgery to contact GI to switch G tube to GJ tube.   Pt continues on cyclic Clinimix E 8/41 at 75 ml/hr x 1 hour, then 126 ml/hr  For 14 hours, 75 ml/hr x 1 hour.  Lipids running at 20 ml/hr over 12 hours.  MVI and trace minerals added. TPN provides- 1839 kcal and 96 g protein (meeting 100% of kcal and 103% protein needs  Labs reviewed: CBG 107-159  Diet Order:  Diet NPO time specified Except for: Sips with Meds TPN (CLINIMIX-E) Adult  Skin:  Wound (see comment) (Closed Abdominal Incision)  Last BM:  02/19/17  Height:   Ht Readings from Last 1 Encounters:  02/06/17 5' 4"  (1.626 m)    Weight:    Wt Readings from Last 1 Encounters:  02/22/17 223 lb 9.6 oz (101.4 kg)    Ideal Body Weight:  54.54 kg  BMI:  Body mass index is 38.38 kg/m.  Estimated Nutritional Needs:   Kcal:  1800-1950 (20-22 kcal/kg bw)  Protein:  82-93g Pro (1.5-1.7g/kg ibw)  Fluid:  1.8-2 L fluid (1 ml/kcal)  EDUCATION NEEDS:   No education needs identified at this time  [place] 02/22/2017 2:29 PM

## 2017-02-22 NOTE — Clinical Social Work Note (Signed)
CSW following for placement pending surgery on 7/10. Pt provided with bed offers at this time.   [place], [place]

## 2017-02-22 NOTE — NC FL2 (Addendum)
[place] LEVEL OF CARE SCREENING TOOL     IDENTIFICATION  Patient Name: Kaylee Mullins Birthdate: 17-Sep-1935 Sex: female Admission Date (Current Location): 02/05/2017  [facility] [place] and [place] Number:  Herbalist and Address:  [facility]. [facility], [place] [address], [place], [place] [address]      Provider Number: [id number]  Attending Physician Name and Address:  Stark Klein, MD  Relative Name and Phone Number:       Current Level of Care: Hospital Recommended Level of Care: [place] Prior Approval Number:    Date Approved/Denied:   PASRR Number: [id number]  Discharge Plan: SNF    Current Diagnoses: Patient Active Problem List   Diagnosis Date Noted  . Malnutrition of moderate degree 02/07/2017  . Gastroparesis 02/06/2017  . Adenocarcinoma ([place]) 01/12/2017  . 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 Essential hypertension 02/11/2016  . Lumbar stenosis with neurogenic claudication 02/10/2016  . Hyperlipidemia 02/10/2016  . Hypothyroidism 02/10/2016  . Musculoskeletal neck pain 12/13/2014  . Muscular pain 12/13/2014  . Numbness   . Acute CVA (cerebrovascular accident) ([place]) 09/16/2014  . Right renal mass 09/13/2014  . Cerebral infarction due to unspecified mechanism   . CVA (cerebral infarction) 09/11/2014  . Osteoarthritis 09/11/2014  . Postsurgical hypothyroidism 09/11/2014  . Uncontrolled hypertension 09/11/2014  . History of CVA (cerebral vascular accident) ([place]) 09/11/2014  . Diabetes mellitus without complication ([place]) 67/59/1638  . Dyslipidemia 04/20/2007    Orientation RESPIRATION BLADDER Height & Weight     Time, Situation, Place, Self  Normal Continent Weight: 223 lb 9.6 oz (101.4 kg) Height:  5\' 4"  (162.6 cm)  BEHAVIORAL SYMPTOMS/MOOD NEUROLOGICAL BOWEL NUTRITION STATUS       (Gastrostomy placed on 7/03)  (Please see d/c summary)  AMBULATORY STATUS COMMUNICATION OF NEEDS Skin   Limited Assist  Verbally Surgical wounds (Closed incision mid abdomen, left abdomen.)                       Personal Care Assistance Level of Assistance  Bathing, Feeding, Dressing Bathing Assistance: Limited assistance Feeding assistance: Independent Dressing Assistance: Limited assistance     Functional Limitations Info  Sight, Hearing, Speech Sight Info: Adequate Hearing Info: Adequate Speech Info: Adequate    SPECIAL CARE FACTORS FREQUENCY  PT (By licensed PT), OT (By licensed OT)     PT Frequency: 2x/week OT Frequency: 2x/week            Contractures Contractures Info: Not present    Additional Factors Info  Code Status, Allergies Code Status Info: Full Code Allergies Info: Hydralazine Hcl, Darvon Propoxyphene Hcl, Nyquil Multi-symptom Pseudoeph-doxylamine-dm-apap, Metformin And Related           Current Medications (02/22/2017):  This is the current hospital active medication list Current Facility-Administered Medications  Medication Dose Route Frequency Provider Last Rate Last Dose  . 0.9 % NaCl with KCl 20 mEq/ L  infusion   Intravenous Continuous Jackson Latino L, PA 50 mL/hr at 02/20/17 2058    . acetaminophen (TYLENOL) tablet 650 mg  650 mg Oral Q6H PRN Greer Pickerel, MD   650 mg at 02/14/17 1805   Or  . acetaminophen (TYLENOL) suppository 650 mg  650 mg Rectal Q6H PRN Greer Pickerel, MD      . albuterol (PROVENTIL) (2.5 MG/3ML) 0.083% nebulizer solution 2.5 mg  2.5 mg Nebulization Q4H PRN Greer Pickerel, MD      . amLODipine (NORVASC) tablet 10 mg  10 mg Per NG tube Daily Stark Klein, MD   10 mg at 02/22/17 0956  . chlorhexidine (PERIDEX) 0.12 % solution 15 mL  15 mL Mouth Rinse BID Stark Klein, MD   15 mL at 02/22/17 0955  . enoxaparin (LOVENOX) injection 90 mg  90 mg Subcutaneous Q12H Monia Sabal, PA-C   90 mg at 02/22/17 0816  . erythromycin (EES) 400 MG/5ML suspension 200 mg  200 mg Oral Q6H Monia Sabal, PA-C   200 mg at 02/22/17 1313  . TPN (CLINIMIX-E)  Adult   Intravenous Cyclic-See Admin Instructions Tyrone Apple, [facility]       And  . fat emulsion 20 % infusion 240 mL  240 mL Intravenous Continuous TPN Tyrone Apple, RPH      . HYDROmorphone (DILAUDID) injection 1 mg  1 mg Intravenous Q4H PRN Stark Klein, MD   1 mg at 02/22/17 1414  . insulin aspart (novoLOG) injection 0-20 Units  0-20 Units Subcutaneous 6 times per day Tyrone Apple, [facility]   3 Units at 02/22/17 1112  . irbesartan (AVAPRO) tablet 300 mg  300 mg Per NG tube Daily Stark Klein, MD   300 mg at 02/22/17 0956  . levothyroxine (SYNTHROID, LEVOTHROID) tablet 200 mcg  200 mcg Per NG tube QAC breakfast Stark Klein, MD   200 mcg at 02/22/17 0816  . MEDLINE mouth rinse  15 mL Mouth Rinse q12n4p Stark Klein, MD   15 mL at 02/22/17 1313  . metoprolol succinate (TOPROL-XL) 24 hr tablet 100 mg  100 mg Oral Daily Greer Pickerel, MD   100 mg at 02/22/17 0955  . NIFEdipine (PROCARDIA-XL/ADALAT CC) 24 hr tablet 30 mg  30 mg Oral BID PRN Greer Pickerel, MD   30 mg at 02/19/17 2031  . nystatin (MYCOSTATIN) 100000 UNIT/ML suspension 500,000 Units  5 mL Oral QID Stark Klein, MD   500,000 Units at 02/22/17 1313  . ondansetron (ZOFRAN-ODT) disintegrating tablet 4 mg  4 mg Oral Q6H PRN Greer Pickerel, MD       Or  . ondansetron [facility]) injection 4 mg  4 mg Intravenous Q6H PRN Greer Pickerel, MD   4 mg at 02/22/17 0137  . oxyCODONE (ROXICODONE) 5 MG/5ML solution 5-10 mg  5-10 mg Oral Q4H PRN Stark Klein, MD   10 mg at 02/22/17 7867  . pantoprazole (PROTONIX) injection 40 mg  40 mg Intravenous Q24H Judeth Horn, MD   40 mg at 02/21/17 2109  . phenol (CHLORASEPTIC) mouth spray 1 spray  1 spray Mouth/Throat PRN Judeth Horn, MD      . promethazine (PHENERGAN) injection 12.5 mg  12.5 mg Intravenous Q6H PRN Greer Pickerel, MD   12.5 mg at 02/21/17 1333  . sodium chloride flush (NS) 0.9 % injection 10-40 mL  10-40 mL Intracatheter PRN Stark Klein, MD   10 mL at 02/20/17 0827     Discharge Medications: Please  see discharge summary for a list of discharge medications.  Relevant Imaging Results:  Relevant Lab Results:   Additional Information SSN: [id number]  Pt has J tube Pt will d/c on long term TPN   Elnora Quizon A Evelynn Hench, LCSW

## 2017-02-22 NOTE — Progress Notes (Signed)
Patient ID: Kaylee Mullins, female   DOB: 01/05/1936, 81 y.o.   MRN: [id number] Assessment s/p Whipple with complication of gastroparesis and persistent n/v -SIPS and chips -continue TPN for prolonged ileus. -up to chair and ambulate      - Con't Gtube to gravity   Plan:  Will contact GI to see if they can switch out a g tube to a GJ tube.  Will discuss with eagle MD on call  (outlaw) to see if he is willing to try tube exchange   LOS: 16 days     12 Days Post-Op  Chief Complaint/Subjective: No n/v.  Tolerating cycled TNA.    Objective: Vital signs in last 24 hours: Temp:  [98.5 F (36.9 C)-99.1 F (37.3 C)] 98.5 F (36.9 C) (07/09 0423) Pulse Rate:  [79-81] 79 (07/09 0423) Resp:  [18] 18 (07/08 2053) BP: (144-173)/(55-78) 158/60 (07/09 0423) SpO2:  [96 %-100 %] 100 % (07/09 0423) Weight:  [101.4 kg (223 lb 9.6 oz)] 101.4 kg (223 lb 9.6 oz) (07/09 0423) Last BM Date: 02/17/17  Intake/Output from previous day: 07/08 0701 - 07/09 0700 In: 3519.8 [I.V.:3349.8] Out: 3650 [Urine:2800; Drains:850] Intake/Output this shift: No intake/output data recorded.  PE: General- In NAD.  Awake and alert. Abdomen-soft, g-tube draining yellow fluid  Lab Results:   Recent Labs  02/22/17 0506  WBC 7.2  HGB 9.5*  HCT 30.4*  PLT 402*   BMET  Recent Labs  02/22/17 0506  NA 136  K 4.3  CL 106  CO2 26  GLUCOSE 94  BUN 19  CREATININE 0.87  CALCIUM 8.5*   PT/INR No results for input(s): LABPROT, INR in the last 72 hours. Comprehensive Metabolic Panel:    Component Value Date/Time   NA 136 02/22/2017 0506   NA 135 02/18/2017 0410   NA 141 11/13/2016 1329   K 4.3 02/22/2017 0506   K 4.2 02/18/2017 0410   K 3.5 11/13/2016 1329   CL 106 02/22/2017 0506   CL 107 02/18/2017 0410   CO2 26 02/22/2017 0506   CO2 24 02/18/2017 0410   CO2 24 11/13/2016 1329   BUN 19 02/22/2017 0506   BUN 20 02/18/2017 0410   BUN 18.7 11/13/2016 1329   CREATININE 0.87 02/22/2017 0506    CREATININE 0.82 02/18/2017 0410   CREATININE 1.0 11/13/2016 1329   GLUCOSE 94 02/22/2017 0506   GLUCOSE 117 (H) 02/18/2017 0410   GLUCOSE 78 11/13/2016 1329   CALCIUM 8.5 (L) 02/22/2017 0506   CALCIUM 8.7 (L) 02/18/2017 0410   CALCIUM 9.3 11/13/2016 1329   AST 40 02/22/2017 0506   AST 49 (H) 02/18/2017 0410   AST 24 11/13/2016 1329   ALT 40 02/22/2017 0506   ALT 42 02/18/2017 0410   ALT 19 11/13/2016 1329   ALKPHOS 82 02/22/2017 0506   ALKPHOS 71 02/18/2017 0410   ALKPHOS 152 (H) 11/13/2016 1329   BILITOT 0.4 02/22/2017 0506   BILITOT 0.4 02/18/2017 0410   BILITOT 1.44 (H) 11/13/2016 1329   PROT 6.7 02/22/2017 0506   PROT 6.2 (L) 02/18/2017 0410   PROT 8.0 11/13/2016 1329   ALBUMIN 2.7 (L) 02/22/2017 0506   ALBUMIN 2.8 (L) 02/18/2017 0410   ALBUMIN 3.5 11/13/2016 1329     Studies/Results: No results found.  Anti-infectives: Anti-infectives    Start     Dose/Rate Route Frequency Ordered Stop   02/16/17 1500  ceFAZolin (ANCEF) IVPB 2g/100 mL premix     2 g 200 mL/hr over 30  Minutes Intravenous To Radiology 02/16/17 1447 02/16/17 1613   02/09/17 0600  erythromycin (EES) 400 MG/5ML suspension 200 mg     200 mg Oral Every 6 hours 02/09/17 0147     02/07/17 0000  erythromycin (EES) 400 MG/5ML suspension 400 mg  Status:  Discontinued     400 mg Oral Every 6 hours 02/07/17 1932 02/09/17 0147   02/06/17 0600  erythromycin ethylsuccinate (EES) 200 MG/5ML suspension 400 mg  Status:  Discontinued     400 mg Oral Every 6 hours 02/06/17 0324 02/07/17 1932       [place] Minus 02/22/2017

## 2017-02-22 NOTE — Progress Notes (Signed)
PHARMACY - ADULT TOTAL PARENTERAL NUTRITION CONSULT NOTE   Pharmacy Consult:  TPN Indication: Ileus  Patient Measurements: Height: 5\' 4"  (162.6 cm) Weight: 223 lb 9.6 oz (101.4 kg) IBW/kg (Calculated) : 54.7 TPN AdjBW (KG): 63.5 Body mass index is 38.38 kg/m.  Assessment:  63 YOF with severe malnutrition related to renal cell and pancreatic cancer who presented with abdominal mass on 01/12/17.  She is s/p Whipple procedure on the same day for pancreatic cancer.  She was discharged 02/05/17 and readmitted same day for vomiting at SNF. Pharmacy consulted to manage TPN for ileus that prevented oral intake and caused significant nausea and vomiting.   GI: prolonged ileus. Prealbumin down to 12.7, drain O/P 888mL, LBM 7/6.  Erythromycin for gastroparesis, PPI IV, PRN Zofran/Phenergan (QTc 442ms on 6/17).  Plan for possible GJ tube Monday or Tuesday.  *6/27 EGD - no acute findings, a lot of bilious fluid.  *Repeat GES - no obstruction, no motility of stomach Endo: hypothyroid on Synthroid.  DM not on med PTA - CBGs controlled, 98-165 on TPN, 101-126 off TPN Insulin requirements in the past 24 hours: 14 units SSI + 75 units in TPN Lytes: all WNL except Mag at 1.8 (goal >/= 2 with ileus)  Renal: SCr stable, BUN WNL. UOP 1.2 ml/kg/hr, NS with 20K at 50 ml/hr, net -5.7L Pulm: stable on RA Cards: HTN. 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 - Continue 20% lipid emulsion at 20 ml/hr over 12 hrs - TPN provides 1839 kCal and 96gm of protein per day, meeting 100% of needs. - Daily multivitamin in TPN - Trace elements in TPN every other day, next 7/9 - Continue resistant cyclic SSI + 75 units regular insulin in TPN - NS with 54mEq of KCl at 50 ml/hr per MD - Mag sulfate 4gm IV x1.  Repeat labs on Thurs. - F/U daily    Auburn Hert D. Mina Marble, PharmD, BCPS Pager:  [phone] 02/22/2017, 9:21 AM

## 2017-02-23 ENCOUNTER — Inpatient Hospital Stay (HOSPITAL_COMMUNITY): Payer: Medicare Other

## 2017-02-23 ENCOUNTER — Encounter (HOSPITAL_COMMUNITY): Payer: Self-pay | Admitting: Interventional Radiology

## 2017-02-23 HISTORY — PX: IR GASTR TUBE CONVERT GASTR-JEJ PER W/FL MOD SED: IMG2332

## 2017-02-23 LAB — GLUCOSE, CAPILLARY
Glucose-Capillary: 111 mg/dL — ABNORMAL HIGH (ref 65–99)
Glucose-Capillary: 134 mg/dL — ABNORMAL HIGH (ref 65–99)
Glucose-Capillary: 144 mg/dL — ABNORMAL HIGH (ref 65–99)
Glucose-Capillary: 146 mg/dL — ABNORMAL HIGH (ref 65–99)
Glucose-Capillary: 156 mg/dL — ABNORMAL HIGH (ref 65–99)
Glucose-Capillary: 168 mg/dL — ABNORMAL HIGH (ref 65–99)

## 2017-02-23 MED ORDER — M.V.I. ADULT IV INJ
INTRAVENOUS | Status: AC
  Administered 2017-02-23: 17:00:00 via INTRAVENOUS
  Filled 2017-02-23: qty 1914

## 2017-02-23 MED ORDER — FAT EMULSION 20 % IV EMUL
240.0000 mL | INTRAVENOUS | Status: AC
  Administered 2017-02-23: 240 mL via INTRAVENOUS
  Filled 2017-02-23: qty 250

## 2017-02-23 MED ORDER — LIDOCAINE VISCOUS 2 % MT SOLN
OROMUCOSAL | Status: AC
  Filled 2017-02-23: qty 15

## 2017-02-23 MED ORDER — IOPAMIDOL (ISOVUE-300) INJECTION 61%
INTRAVENOUS | Status: AC
  Administered 2017-02-23: 20 mL
  Filled 2017-02-23: qty 50

## 2017-02-23 NOTE — Procedures (Signed)
Interventional Radiology Procedure Note  Procedure: Conversion of gastrostomy to gastrojejunal feeding catheter  Complications: None  Estimated Blood Loss: < 10 mL  Able to catheterize efferent jejunum and place coaxial 9 Fr GJ tube through pre-existing gastrostomy into jejunum.  OK to use tube.  Venetia Night. Kathlene Cote, M.D Pager:  [phone]

## 2017-02-23 NOTE — Progress Notes (Signed)
Patient ID: Kaylee Mullins, female   DOB: June 22, 1936, 81 y.o.   MRN: [id number] Assessment s/p Whipple with complication of gastroparesis and persistent n/v -SIPS and chips -continue TPN for prolonged ileus. -up to chair and ambulate      - Con't Gtube to gravity   Plan:  IR did tube exchange.  Will get plain film tomorrow to evaluate tube position If in correct location, will try to start J tube feeds.     LOS: 17 days     13 Days Post-Op  Chief Complaint/Subjective: Doing OK.  Has pain and nausea after she gets up to go to the bathroom.  Objective: Vital signs in last 24 hours: Temp:  [98.4 F (36.9 C)-98.9 F (37.2 C)] 98.6 F (37 C) (07/10 1648) Pulse Rate:  [64-81] 77 (07/10 1648) Resp:  [17-20] 20 (07/10 1648) BP: (138-154)/(59-97) 153/97 (07/10 1648) SpO2:  [96 %-100 %] 98 % (07/10 1648) Last BM Date: 02/17/17  Intake/Output from previous day: 07/09 0701 - 07/10 0700 In: 1683 [I.V.:1633] Out: 85 [Urine:2100; Drains:500] Intake/Output this shift: Total I/O In: 850 [I.V.:850] Out: 1100 [Urine:1100]  PE: General- In NAD.  Awake and alert. Abdomen-soft, g-tube draining yellow fluid  Lab Results:   Recent Labs  02/22/17 0506  WBC 7.2  HGB 9.5*  HCT 30.4*  PLT 402*   BMET  Recent Labs  02/22/17 0506  NA 136  K 4.3  CL 106  CO2 26  GLUCOSE 94  BUN 19  CREATININE 0.87  CALCIUM 8.5*   PT/INR No results for input(s): LABPROT, INR in the last 72 hours. Comprehensive Metabolic Panel:    Component Value Date/Time   NA 136 02/22/2017 0506   NA 135 02/18/2017 0410   NA 141 11/13/2016 1329   K 4.3 02/22/2017 0506   K 4.2 02/18/2017 0410   K 3.5 11/13/2016 1329   CL 106 02/22/2017 0506   CL 107 02/18/2017 0410   CO2 26 02/22/2017 0506   CO2 24 02/18/2017 0410   CO2 24 11/13/2016 1329   BUN 19 02/22/2017 0506   BUN 20 02/18/2017 0410   BUN 18.7 11/13/2016 1329   CREATININE 0.87 02/22/2017 0506   CREATININE 0.82 02/18/2017 0410   CREATININE 1.0 11/13/2016 1329   GLUCOSE 94 02/22/2017 0506   GLUCOSE 117 (H) 02/18/2017 0410   GLUCOSE 78 11/13/2016 1329   CALCIUM 8.5 (L) 02/22/2017 0506   CALCIUM 8.7 (L) 02/18/2017 0410   CALCIUM 9.3 11/13/2016 1329   AST 40 02/22/2017 0506   AST 49 (H) 02/18/2017 0410   AST 24 11/13/2016 1329   ALT 40 02/22/2017 0506   ALT 42 02/18/2017 0410   ALT 19 11/13/2016 1329   ALKPHOS 82 02/22/2017 0506   ALKPHOS 71 02/18/2017 0410   ALKPHOS 152 (H) 11/13/2016 1329   BILITOT 0.4 02/22/2017 0506   BILITOT 0.4 02/18/2017 0410   BILITOT 1.44 (H) 11/13/2016 1329   PROT 6.7 02/22/2017 0506   PROT 6.2 (L) 02/18/2017 0410   PROT 8.0 11/13/2016 1329   ALBUMIN 2.7 (L) 02/22/2017 0506   ALBUMIN 2.8 (L) 02/18/2017 0410   ALBUMIN 3.5 11/13/2016 1329     Studies/Results: No results found.  Anti-infectives: Anti-infectives    Start     Dose/Rate Route Frequency Ordered Stop   02/16/17 1500  ceFAZolin (ANCEF) IVPB 2g/100 mL premix     2 g 200 mL/hr over 30 Minutes Intravenous To Radiology 02/16/17 1447 02/16/17 1613   02/09/17 0600  erythromycin (EES)  400 MG/5ML suspension 200 mg     200 mg Oral Every 6 hours 02/09/17 0147     02/07/17 0000  erythromycin (EES) 400 MG/5ML suspension 400 mg  Status:  Discontinued     400 mg Oral Every 6 hours 02/07/17 1932 02/09/17 0147   02/06/17 0600  erythromycin ethylsuccinate (EES) 200 MG/5ML suspension 400 mg  Status:  Discontinued     400 mg Oral Every 6 hours 02/06/17 0324 02/07/17 1932       Kamaron Deskins 02/23/2017

## 2017-02-23 NOTE — Care Management Important Message (Signed)
Important Message  Patient Details  Name: Kaylee Mullins MRN: [id number] Date of Birth: [birth year]   Medicare Important Message Given:  Yes    Kaylee Mullins 02/23/2017, 1:27 PM

## 2017-02-23 NOTE — Progress Notes (Addendum)
PHARMACY - ADULT TOTAL PARENTERAL NUTRITION CONSULT NOTE   Pharmacy Consult:  TPN Indication: Ileus  Patient Measurements: Height: 5\' 4"  (162.6 cm) Weight: 223 lb 9.6 oz (101.4 kg) IBW/kg (Calculated) : 54.7 TPN AdjBW (KG): 63.5 Body mass index is 38.38 kg/m.  Assessment:  55 YOF with severe malnutrition related to renal cell and pancreatic cancer who presented with abdominal mass on 01/12/17.  She is s/p Whipple procedure on the same day for pancreatic cancer.  She was discharged 02/05/17 and readmitted same day for vomiting at SNF. Pharmacy consulted to manage TPN for ileus that prevented oral intake and caused significant nausea and vomiting.   GI: prolonged ileus. Prealbumin down to 12.7, drain O/P 561mL, LBM 7/6.  Erythromycin for gastroparesis, PPI IV, PRN Zofran/Phenergan (QTc 422ms on 6/17).  Plan for possible GJ tube Tuesday.  *6/27 EGD - no acute findings, a lot of bilious fluid.  *Repeat GES - no obstruction, no motility of stomach Endo: hypothyroid on Synthroid.  DM not on med PTA - CBGs 98-242 on TPN (one out of range value), 95-129 off TPN.   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 - Continue 20% lipid emulsion at 20 ml/hr over 12 hrs - TPN provides 1839 kCal and 96gm of protein per day, meeting 100% of needs. - Daily multivitamin in TPN - Trace elements in TPN every other day, next 7/11 - Continue resistant cyclic SSI + 75 units regular insulin in TPN - NS with 37mEq of KCl at 50 ml/hr per MD - Mag sulfate 4gm given on 7/9. Repeat labs on Thurs. - F/U with possibility of tube exchange and initiation of EN - Consider increasing Lovenox to 100mg  SQ Q12H given increasing weight (net -8.7L since admit)    Telesa Jeancharles D. Mina Marble, PharmD, BCPS Pager:  [phone] 02/23/2017, 8:32 AM

## 2017-02-23 NOTE — Progress Notes (Signed)
Subjective: Ongoing nausea and vomiting with liquids or solids. EGD couple weeks ago unrevealing. Imaging (CT, UGI series) most consistent with post-operative gastroparesis.  Objective: Vital signs in last 24 hours: Temp:  [98.4 F (36.9 C)-98.5 F (36.9 C)] 98.5 F (36.9 C) (07/10 0604) Pulse Rate:  [64-72] 70 (07/10 0604) Resp:  [17-20] 20 (07/10 0604) BP: (138-154)/(59-67) 154/67 (07/10 0604) SpO2:  [96 %-100 %] 100 % (07/10 0604) Weight change:  Last BM Date: 02/17/17  PE: GEN: Deconditioned, overweight, but NAD ABD:  Protuberant, Gtube in place, generalized tenderness without peritonitis, mild distention  Lab Results: CBC    Component Value Date/Time   WBC 7.2 02/22/2017 0506   RBC 3.09 (L) 02/22/2017 0506   HGB 9.5 (L) 02/22/2017 0506   HCT 30.4 (L) 02/22/2017 0506   PLT 402 (H) 02/22/2017 0506   MCV 98.4 02/22/2017 0506   MCH 30.7 02/22/2017 0506   MCHC 31.3 02/22/2017 0506   RDW 15.0 02/22/2017 0506   LYMPHSABS 2.6 02/22/2017 0506   MONOABS 0.5 02/22/2017 0506   EOSABS 0.3 02/22/2017 0506   BASOSABS 0.0 02/22/2017 0506   CMP     Component Value Date/Time   NA 136 02/22/2017 0506   NA 141 11/13/2016 1329   K 4.3 02/22/2017 0506   K 3.5 11/13/2016 1329   CL 106 02/22/2017 0506   CO2 26 02/22/2017 0506   CO2 24 11/13/2016 1329   GLUCOSE 94 02/22/2017 0506   GLUCOSE 78 11/13/2016 1329   BUN 19 02/22/2017 0506   BUN 18.7 11/13/2016 1329   CREATININE 0.87 02/22/2017 0506   CREATININE 1.0 11/13/2016 1329   CALCIUM 8.5 (L) 02/22/2017 0506   CALCIUM 9.3 11/13/2016 1329   PROT 6.7 02/22/2017 0506   PROT 8.0 11/13/2016 1329   ALBUMIN 2.7 (L) 02/22/2017 0506   ALBUMIN 3.5 11/13/2016 1329   AST 40 02/22/2017 0506   AST 24 11/13/2016 1329   ALT 40 02/22/2017 0506   ALT 19 11/13/2016 1329   ALKPHOS 82 02/22/2017 0506   ALKPHOS 152 (H) 11/13/2016 1329   BILITOT 0.4 02/22/2017 0506   BILITOT 1.44 (H) 11/13/2016 1329   GFRNONAA >60 02/22/2017 0506   GFRAA  >60 02/22/2017 0506   Assessment:  1.  Nausea and vomiting. Suspect post operative gastroparesis.  No evidence of obstruction on EGD, UGI series, CT scan. 2.  Protein calorie malnutrition, moderate severity.  Plan:  1.  Endoscopically-placed jejunal extension through pre-existing G-tubes have been done, but are typically not successful and the jejunal extension usually become inadvertently retracted back into the gastric remnant either during the endoscopic procedure or within a few days afterwards.  As a result, I would not recommend this. 2.  I would see if radiology could try placing jejunal extension under fluoroscopy, or maybe they could even place direct J-tube; if they aren't successful one way or the other, then one might consider tertiary center referral where an advanced endoscopist might give it a shot. 3.  Unfortunately nothing else to offer from GI perspective.  Thank you for the consult.   Kaylee Mullins 02/23/2017, 9:54 AM   Pager [phone] If no answer or after 5 PM call [phone]

## 2017-02-24 ENCOUNTER — Inpatient Hospital Stay (HOSPITAL_COMMUNITY): Payer: Medicare Other

## 2017-02-24 LAB — GLUCOSE, CAPILLARY
Glucose-Capillary: 134 mg/dL — ABNORMAL HIGH (ref 65–99)
Glucose-Capillary: 113 mg/dL — ABNORMAL HIGH (ref 65–99)
Glucose-Capillary: 118 mg/dL — ABNORMAL HIGH (ref 65–99)
Glucose-Capillary: 168 mg/dL — ABNORMAL HIGH (ref 65–99)
Glucose-Capillary: 163 mg/dL — ABNORMAL HIGH (ref 65–99)

## 2017-02-24 MED ORDER — FAT EMULSION 20 % IV EMUL
240.0000 mL | INTRAVENOUS | Status: AC
  Administered 2017-02-24: 240 mL via INTRAVENOUS
  Filled 2017-02-24: qty 250

## 2017-02-24 MED ORDER — TRACE MINERALS CR-CU-MN-SE-ZN 10-1000-500-60 MCG/ML IV SOLN
INTRAVENOUS | Status: AC
  Administered 2017-02-24: 17:00:00 via INTRAVENOUS
  Filled 2017-02-24: qty 1914

## 2017-02-24 NOTE — Progress Notes (Signed)
PHARMACY - ADULT TOTAL PARENTERAL NUTRITION CONSULT NOTE   Pharmacy Consult:  TPN Indication: Ileus  Patient Measurements: Height: 5\' 4"  (162.6 cm) Weight: 223 lb 9.6 oz (101.4 kg) IBW/kg (Calculated) : 54.7 TPN AdjBW (KG): 63.5 Body mass index is 38.38 kg/m.  Assessment:  34 YOF with severe malnutrition related to renal cell and pancreatic cancer who presented with abdominal mass on 01/12/17.  She is s/p Whipple procedure on the same day for pancreatic cancer.  She was discharged 02/05/17 and readmitted same day for vomiting at SNF. Pharmacy consulted to manage TPN for ileus that prevented oral intake and caused significant nausea and vomiting.   GI: prolonged ileus. Prealbumin down to 12.7, drain O/P 676mL, LBM 7/6.  Erythromycin for gastroparesis, PPI IV, PRN Zofran/Phenergan (QTc 440ms on 6/17). S/p GJ 7/10>>confirming placement and may start TF  *6/27 EGD - no acute findings, a lot of bilious fluid.  *Repeat GES - no obstruction, no motility of stomach Endo: hypothyroid on Synthroid.  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 Continue 20% lipid emulsion at 20 ml/hr over 12 hrs TPN provides 1839 kCal and 96gm of protein per day, meeting 100% of needs. Daily multivitamin in TPN Trace elements in TPN every other day, next 7/11 Continue resistant cyclic SSI + 75 units regular insulin in TPN NS with 28mEq of KCl at 50 ml/hr per MD F/u AM labs and TF initiation Consider increasing Lovenox to 100mg  SQ Q12H given increasing weight (net -8.7L since admit)  Salome Arnt, PharmD, BCPS 02/24/2017 10:15 AM

## 2017-02-24 NOTE — Progress Notes (Signed)
Patient ID: Kaylee Mullins, female   DOB: 06-28-1936, 81 y.o.   MRN: [id number] Assessment s/p Whipple with complication of gastroparesis and persistent n/v -SIPS and chips -continue TPN for prolonged ileus. -up to chair and ambulate      - Con't Gtube to gravity   Plan:  IR did tube exchange.  Plain film to confirm J tube position since it was starting to retract. Start tube feeds    LOS: 18 days     14 Days Post-Op  Chief Complaint/Subjective: Doing well.  No other events  Objective: Vital signs in last 24 hours: Temp:  [98.6 F (37 C)-99 F (37.2 C)] 98.7 F (37.1 C) (07/11 0550) Pulse Rate:  [65-81] 68 (07/11 0550) Resp:  [18-20] 20 (07/10 1648) BP: (138-154)/(63-97) 154/63 (07/11 0550) SpO2:  [98 %-100 %] 100 % (07/11 0550) Last BM Date: 02/17/17  Intake/Output from previous day: 07/10 0701 - 07/11 0700 In: 1410 [I.V.:1410] Out: 2200 [Urine:1600; Drains:600] Intake/Output this shift: No intake/output data recorded.  PE: General- In NAD.  Awake and alert. Abdomen-soft, g-tube draining yellow fluid  Lab Results:   Recent Labs  02/22/17 0506  WBC 7.2  HGB 9.5*  HCT 30.4*  PLT 402*   BMET  Recent Labs  02/22/17 0506  NA 136  K 4.3  CL 106  CO2 26  GLUCOSE 94  BUN 19  CREATININE 0.87  CALCIUM 8.5*   PT/INR No results for input(s): LABPROT, INR in the last 72 hours. Comprehensive Metabolic Panel:    Component Value Date/Time   NA 136 02/22/2017 0506   NA 135 02/18/2017 0410   NA 141 11/13/2016 1329   K 4.3 02/22/2017 0506   K 4.2 02/18/2017 0410   K 3.5 11/13/2016 1329   CL 106 02/22/2017 0506   CL 107 02/18/2017 0410   CO2 26 02/22/2017 0506   CO2 24 02/18/2017 0410   CO2 24 11/13/2016 1329   BUN 19 02/22/2017 0506   BUN 20 02/18/2017 0410   BUN 18.7 11/13/2016 1329   CREATININE 0.87 02/22/2017 0506   CREATININE 0.82 02/18/2017 0410   CREATININE 1.0 11/13/2016 1329   GLUCOSE 94 02/22/2017 0506   GLUCOSE 117 (H) 02/18/2017  0410   GLUCOSE 78 11/13/2016 1329   CALCIUM 8.5 (L) 02/22/2017 0506   CALCIUM 8.7 (L) 02/18/2017 0410   CALCIUM 9.3 11/13/2016 1329   AST 40 02/22/2017 0506   AST 49 (H) 02/18/2017 0410   AST 24 11/13/2016 1329   ALT 40 02/22/2017 0506   ALT 42 02/18/2017 0410   ALT 19 11/13/2016 1329   ALKPHOS 82 02/22/2017 0506   ALKPHOS 71 02/18/2017 0410   ALKPHOS 152 (H) 11/13/2016 1329   BILITOT 0.4 02/22/2017 0506   BILITOT 0.4 02/18/2017 0410   BILITOT 1.44 (H) 11/13/2016 1329   PROT 6.7 02/22/2017 0506   PROT 6.2 (L) 02/18/2017 0410   PROT 8.0 11/13/2016 1329   ALBUMIN 2.7 (L) 02/22/2017 0506   ALBUMIN 2.8 (L) 02/18/2017 0410   ALBUMIN 3.5 11/13/2016 1329     Studies/Results: Dg Abd 1 View  Result Date: 02/24/2017 CLINICAL DATA:  Jejunostomy tube placement. EXAM: ABDOMEN - 1 VIEW COMPARISON:  Radiograph of February 10, 2017. FINDINGS: The bowel gas pattern is normal. Residual contrast is noted in the colon. Gastrostomy tube is seen well positioned over gastric air bubble. Feeding tube tip is seen in expected position of proximal stomach. Phleboliths are noted in the pelvis. IMPRESSION: No evidence of bowel obstruction  or ileus. Gastrostomy tube is seen well positioned within gastric lumen. Distal tip of feeding tube is seen in proximal stomach. Electronically Signed   By: Marijo Conception, M.D.   On: 02/24/2017 08:46   Ir Chancy Milroy Darius Bump Per W/fl Mod Sed  Result Date: 02/23/2017 INDICATION: Status post Whipple procedure with intractable nausea and vomiting due to poor gastric emptying. Status post percutaneous gastrostomy tube placement on 02/16/2017 with inability at that time to place a coaxial gastrojejunal catheter into the efferent jejunum. Re-attempt is now made to place a coaxial gastrojejunal feeding catheter. EXAM: CONVERSION OF GASTROSTOMY TUBE TO GASTROJEJUNAL CATHETER MEDICATIONS: None ANESTHESIA/SEDATION: None CONTRAST:  20 mL Isovue-300 - administered into the gastric  lumen. FLUOROSCOPY TIME:  Fluoroscopy Time: 8 minutes (183 mGy). COMPLICATIONS: None immediate. PROCEDURE: Informed written consent was obtained from the patient after a thorough discussion of the procedural risks, benefits and alternatives. All questions were addressed. Maximal Sterile Barrier Technique was utilized including caps, mask, sterile gowns, sterile gloves, sterile drape, hand hygiene and skin antiseptic. A timeout was performed prior to the initiation of the procedure. A 5 French catheter was advanced through the lumen of the percutaneous gastrostomy. Contrast was injected. A guidewire was advanced into the jejunum. A coaxial 9 French gastrojejunal catheter was then advanced over the wire. Position was confirmed by fluoroscopy after injection of contrast. FINDINGS: There was ability to catheterize the efferent jejunum via the gastrostomy tube. The gastrojejunal feeding catheter was advanced into the jejunum. IMPRESSION: Conversion of gastrostomy to gastrojejunal catheter by placement of a coaxial 9 French gastrojejunal feeding catheter through the pre-existing gastrostomy. Electronically Signed   By: Aletta Edouard M.D.   On: 02/23/2017 17:02    Anti-infectives: Anti-infectives    Start     Dose/Rate Route Frequency Ordered Stop   02/16/17 1500  ceFAZolin (ANCEF) IVPB 2g/100 mL premix     2 g 200 mL/hr over 30 Minutes Intravenous To Radiology 02/16/17 1447 02/16/17 1613   02/09/17 0600  erythromycin (EES) 400 MG/5ML suspension 200 mg     200 mg Oral Every 6 hours 02/09/17 0147     02/07/17 0000  erythromycin (EES) 400 MG/5ML suspension 400 mg  Status:  Discontinued     400 mg Oral Every 6 hours 02/07/17 1932 02/09/17 0147   02/06/17 0600  erythromycin ethylsuccinate (EES) 200 MG/5ML suspension 400 mg  Status:  Discontinued     400 mg Oral Every 6 hours 02/06/17 0324 02/07/17 1932       Kassity Woodson 02/24/2017

## 2017-02-25 ENCOUNTER — Inpatient Hospital Stay (HOSPITAL_COMMUNITY): Payer: Medicare Other

## 2017-02-25 LAB — COMPREHENSIVE METABOLIC PANEL
ALT: 38 U/L (ref 14–54)
AST: 40 U/L (ref 15–41)
Albumin: 2.7 g/dL — ABNORMAL LOW (ref 3.5–5.0)
Alkaline Phosphatase: 90 U/L (ref 38–126)
Anion gap: 6 (ref 5–15)
BUN: 16 mg/dL (ref 6–20)
CO2: 27 mmol/L (ref 22–32)
Calcium: 8.4 mg/dL — ABNORMAL LOW (ref 8.9–10.3)
Chloride: 103 mmol/L (ref 101–111)
Creatinine, Ser: 0.77 mg/dL (ref 0.44–1.00)
GFR calc Af Amer: >60 mL/min (ref >60)
GFR calc non Af Amer: >60 mL/min (ref >60)
Glucose, Bld: 139 mg/dL — ABNORMAL HIGH (ref 65–99)
Potassium: 4.2 mmol/L (ref 3.5–5.1)
Sodium: 136 mmol/L (ref 135–145)
Total Bilirubin: 0.4 mg/dL (ref 0.3–1.2)
Total Protein: 6.5 g/dL (ref 6.5–8.1)

## 2017-02-25 LAB — GLUCOSE, CAPILLARY
Glucose-Capillary: 131 mg/dL — ABNORMAL HIGH (ref 65–99)
Glucose-Capillary: 139 mg/dL — ABNORMAL HIGH (ref 65–99)
Glucose-Capillary: 149 mg/dL — ABNORMAL HIGH (ref 65–99)
Glucose-Capillary: 161 mg/dL — ABNORMAL HIGH (ref 65–99)
Glucose-Capillary: 168 mg/dL — ABNORMAL HIGH (ref 65–99)
Glucose-Capillary: 169 mg/dL — ABNORMAL HIGH (ref 65–99)
Glucose-Capillary: 93 mg/dL (ref 65–99)
Glucose-Capillary: 97 mg/dL (ref 65–99)

## 2017-02-25 LAB — MAGNESIUM: Magnesium: 1.8 mg/dL (ref 1.7–2.4)

## 2017-02-25 LAB — PHOSPHORUS: Phosphorus: 3.4 mg/dL (ref 2.5–4.6)

## 2017-02-25 MED ORDER — MAGNESIUM SULFATE 2 GM/50ML IV SOLN
2.0000 g | Freq: Once | INTRAVENOUS | Status: AC
  Administered 2017-02-25: 2 g via INTRAVENOUS
  Filled 2017-02-25: qty 50

## 2017-02-25 MED ORDER — IOPAMIDOL (ISOVUE-300) INJECTION 61%
INTRAVENOUS | Status: AC
  Filled 2017-02-25: qty 30

## 2017-02-25 MED ORDER — M.V.I. ADULT IV INJ
INTRAVENOUS | Status: AC
  Administered 2017-02-25: 18:00:00 via INTRAVENOUS
  Filled 2017-02-25: qty 1914

## 2017-02-25 MED ORDER — FAT EMULSION 20 % IV EMUL
240.0000 mL | INTRAVENOUS | Status: AC
  Administered 2017-02-25: 240 mL via INTRAVENOUS
  Filled 2017-02-25: qty 250

## 2017-02-25 NOTE — Progress Notes (Signed)
PHARMACY - ADULT TOTAL PARENTERAL NUTRITION CONSULT NOTE   Pharmacy Consult:  TPN Indication: Ileus  Patient Measurements: Height: 5\' 4"  (162.6 cm) Weight: 223 lb 9.6 oz (101.4 kg) IBW/kg (Calculated) : 54.7 TPN AdjBW (KG): 63.5 Body mass index is 38.38 kg/m.  Assessment:  74 YOF with severe malnutrition related to renal cell and pancreatic cancer who presented with abdominal mass on 01/12/17.  She is s/p Whipple procedure on the same day for pancreatic cancer.  She was discharged 02/05/17 and readmitted same day for vomiting at SNF. Pharmacy consulted to manage TPN for ileus that prevented oral intake and caused significant nausea and vomiting.   GI: prolonged ileus. Prealbumin down to 12.7, drain O/P mL, LBM 7/6.  Erythromycin for gastroparesis, PPI IV, PRN Zofran/Phenergan (QTc 427ms on 6/17). S/p GJ 7/10>>possibly starting TF soon  *6/27 EGD - no acute findings, a lot of bilious fluid.  *Repeat GES - no obstruction, no motility of stomach Endo: hypothyroid on Synthroid.  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 Continue 20% lipid emulsion at 20 ml/hr over 12 hrs TPN provides 1839 kCal and 96gm of protein per day, meeting 100% of needs. Daily multivitamin in TPN Trace elements in TPN every other day, next 7/13 Continue resistant cyclic SSI + 75 units regular insulin in TPN Mag 2gm IV x 1 NS with 5mEq of KCl at 50 ml/hr per MD F/u AM labs and TF initiation Consider increasing Lovenox to 100mg  SQ Q12H given increasing weight  Salome Arnt, PharmD, BCPS 02/25/2017 10:47 AM

## 2017-02-25 NOTE — Progress Notes (Signed)
Patient ID: Kaylee Mullins, female   DOB: March 06, 1936, 81 y.o.   MRN: [id number]   GJ tube has malpositioned per Dr [name]  IMPRESSION: No evidence of bowel obstruction or ileus. Gastrostomy tube is seen well positioned within gastric lumen. Distal tip of feeding tube is seen in proximal stomach.  Now scheduled for replace/reposition in IR Consent signed and in chart

## 2017-02-25 NOTE — Progress Notes (Signed)
Nutrition Follow-up  DOCUMENTATION CODES:   Non-severe (moderate) malnutrition in context of acute illness/injury, Obesity unspecified  INTERVENTION:   -TPN management per pharmacy -If tube feed initiated recommend: Vital AF 1.5 @ 55 ml/hr Provides: 1980 kcals, 89 grams protein, 1003 ml free water (meets 102% calories and 100% of protein needs)  NUTRITION DIAGNOSIS:   Malnutrition related to chronic illness, acute illness as evidenced by mild depletion of muscle mass, mild depletion of body fat.  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 Had prolonged postop course due to delayed gastric emptying due to gastroparesis and intolerance of TNA, ultimately started on TPN. Represents from SNF after being discharged for less than 12 hours, due to vomiting and abdominal pain.   6/27- s/p endoscopy, which revealed normal esophagus, gastrojejunostmy with healthy appearing mucosa, and stomach with a lot of bilious fluid 6/27- NGT placed 6/30- per [facility] notes, unobstructed outflow from stomach, but no motility (outflow seems gravity dependent) 7/10- GJ tube placed  Per surgery notes, imaging (CT. UTI) consistent with post-op gastroenteritis.   Reviewed KUB results; GJ tube came back into the stomach. Pt is scheduled to replace/reposition in IR.   Pt with G-tube to gravity; noted 250 ml output within the past 24 hours per doc flowsheets.  Pt remains on TPN. Per pharmacy note, plan to continue cyclic Clinimix E 6/38, infuse 1914 mL over 16 hrs: 75 mL/hr x 1 hr, then 126 mL/hr x 14 hrs, then 75 mL/hr x 1 hr and 20% lipid emulsion at 20 ml/hr over 12 hrs. TPN provides 1839 kCal and 96gm of protein  per day, meeting 100% of needs.  Labs reviewed: CBGS: 93-139.   Diet Order:  Diet NPO time specified Except for: Sips with Meds TPN (CLINIMIX-E) Adult  Skin:  Wound (see comment) (Closed Abdominal Incision)  Last BM:  02/19/17  Height:   Ht Readings from Last 1 Encounters:  02/06/17 _0  (1.626 m)    Weight:   Wt Readings from Last 1 Encounters:  02/22/17 223 lb 9.6 oz (101.4 kg)    Ideal Body Weight:  54.54 kg  BMI:  Body mass index is 38.38 kg/m.  Estimated Nutritional Needs:   Kcal:  1800-1950 (20-22 kcal/kg bw)  Protein:  82-93g Pro (1.5-1.7g/kg ibw)  Fluid:  1.8-2 L fluid (1 ml/kcal)  EDUCATION NEEDS:   No education needs identified at this time  Farrell Pantaleo A. Jimmye [name], RD, LDN, CDE Pager: [phone] After hours Pager: [phone]

## 2017-02-25 NOTE — Progress Notes (Signed)
Patient ID: Kaylee Mullins, female   DOB: November 28, 1935, 81 y.o.   MRN: [id number] Assessment s/p Whipple with complication of gastroparesis and persistent n/v   Plan:         -GJ tube change out again as jejunostomy tube came back to the stomach. -SIPS and chips -continue TPN for prolonged ileus. -up to chair and ambulate      - Con't Gtube port to gravity   Discussed with IR.     LOS: 19 days     15 Days Post-Op  Chief Complaint/Subjective: Having some more pain and bloating this AM.    Objective: Vital signs in last 24 hours: Temp:  [97.5 F (36.4 C)-98.7 F (37.1 C)] 98.7 F (37.1 C) (07/12 0446) Pulse Rate:  [60-71] 64 (07/12 0446) Resp:  [16-18] 16 (07/12 0446) BP: (136-161)/(56-80) 161/58 (07/12 0446) SpO2:  [98 %] 98 % (07/12 0446) Last BM Date: 02/17/17  Intake/Output from previous day: 07/11 0701 - 07/12 0700 In: 420 [I.V.:400] Out: 1925 [Urine:1850; Drains:75] Intake/Output this shift: Total I/O In: 50 [Other:50] Out: 300 [Urine:300]  PE: General- In NAD.  Awake and alert. Abdomen-soft, g-tube draining yellow fluid Sl distended.    Lab Results:  No results for input(s): WBC, HGB, HCT, PLT in the last 72 hours. BMET  Recent Labs  02/25/17 0146  NA 136  K 4.2  CL 103  CO2 27  GLUCOSE 139*  BUN 16  CREATININE 0.77  CALCIUM 8.4*   PT/INR No results for input(s): LABPROT, INR in the last 72 hours. Comprehensive Metabolic Panel:    Component Value Date/Time   NA 136 02/25/2017 0146   NA 136 02/22/2017 0506   NA 141 11/13/2016 1329   K 4.2 02/25/2017 0146   K 4.3 02/22/2017 0506   K 3.5 11/13/2016 1329   CL 103 02/25/2017 0146   CL 106 02/22/2017 0506   CO2 27 02/25/2017 0146   CO2 26 02/22/2017 0506   CO2 24 11/13/2016 1329   BUN 16 02/25/2017 0146   BUN 19 02/22/2017 0506   BUN 18.7 11/13/2016 1329   CREATININE 0.77 02/25/2017 0146   CREATININE 0.87 02/22/2017 0506   CREATININE 1.0 11/13/2016 1329   GLUCOSE 139 (H) 02/25/2017  0146   GLUCOSE 94 02/22/2017 0506   GLUCOSE 78 11/13/2016 1329   CALCIUM 8.4 (L) 02/25/2017 0146   CALCIUM 8.5 (L) 02/22/2017 0506   CALCIUM 9.3 11/13/2016 1329   AST 40 02/25/2017 0146   AST 40 02/22/2017 0506   AST 24 11/13/2016 1329   ALT 38 02/25/2017 0146   ALT 40 02/22/2017 0506   ALT 19 11/13/2016 1329   ALKPHOS 90 02/25/2017 0146   ALKPHOS 82 02/22/2017 0506   ALKPHOS 152 (H) 11/13/2016 1329   BILITOT 0.4 02/25/2017 0146   BILITOT 0.4 02/22/2017 0506   BILITOT 1.44 (H) 11/13/2016 1329   PROT 6.5 02/25/2017 0146   PROT 6.7 02/22/2017 0506   PROT 8.0 11/13/2016 1329   ALBUMIN 2.7 (L) 02/25/2017 0146   ALBUMIN 2.7 (L) 02/22/2017 0506   ALBUMIN 3.5 11/13/2016 1329     Studies/Results: Dg Abd 1 View  Result Date: 02/24/2017 CLINICAL DATA:  Jejunostomy tube placement. EXAM: ABDOMEN - 1 VIEW COMPARISON:  Radiograph of February 10, 2017. FINDINGS: The bowel gas pattern is normal. Residual contrast is noted in the colon. Gastrostomy tube is seen well positioned over gastric air bubble. Feeding tube tip is seen in expected position of proximal stomach. Phleboliths are noted in  the pelvis. IMPRESSION: No evidence of bowel obstruction or ileus. Gastrostomy tube is seen well positioned within gastric lumen. Distal tip of feeding tube is seen in proximal stomach. Electronically Signed   By: Marijo Conception, M.D.   On: 02/24/2017 08:46   Ir Chancy Milroy Darius Bump Per W/fl Mod Sed  Result Date: 02/23/2017 INDICATION: Status post Whipple procedure with intractable nausea and vomiting due to poor gastric emptying. Status post percutaneous gastrostomy tube placement on 02/16/2017 with inability at that time to place a coaxial gastrojejunal catheter into the efferent jejunum. Re-attempt is now made to place a coaxial gastrojejunal feeding catheter. EXAM: CONVERSION OF GASTROSTOMY TUBE TO GASTROJEJUNAL CATHETER MEDICATIONS: None ANESTHESIA/SEDATION: None CONTRAST:  20 mL Isovue-300 -  administered into the gastric lumen. FLUOROSCOPY TIME:  Fluoroscopy Time: 8 minutes (183 mGy). COMPLICATIONS: None immediate. PROCEDURE: Informed written consent was obtained from the patient after a thorough discussion of the procedural risks, benefits and alternatives. All questions were addressed. Maximal Sterile Barrier Technique was utilized including caps, mask, sterile gowns, sterile gloves, sterile drape, hand hygiene and skin antiseptic. A timeout was performed prior to the initiation of the procedure. A 5 French catheter was advanced through the lumen of the percutaneous gastrostomy. Contrast was injected. A guidewire was advanced into the jejunum. A coaxial 9 French gastrojejunal catheter was then advanced over the wire. Position was confirmed by fluoroscopy after injection of contrast. FINDINGS: There was ability to catheterize the efferent jejunum via the gastrostomy tube. The gastrojejunal feeding catheter was advanced into the jejunum. IMPRESSION: Conversion of gastrostomy to gastrojejunal catheter by placement of a coaxial 9 French gastrojejunal feeding catheter through the pre-existing gastrostomy. Electronically Signed   By: Aletta Edouard M.D.   On: 02/23/2017 17:02    Anti-infectives: Anti-infectives    Start     Dose/Rate Route Frequency Ordered Stop   02/16/17 1500  ceFAZolin (ANCEF) IVPB 2g/100 mL premix     2 g 200 mL/hr over 30 Minutes Intravenous To Radiology 02/16/17 1447 02/16/17 1613   02/09/17 0600  erythromycin (EES) 400 MG/5ML suspension 200 mg     200 mg Oral Every 6 hours 02/09/17 0147     02/07/17 0000  erythromycin (EES) 400 MG/5ML suspension 400 mg  Status:  Discontinued     400 mg Oral Every 6 hours 02/07/17 1932 02/09/17 0147   02/06/17 0600  erythromycin ethylsuccinate (EES) 200 MG/5ML suspension 400 mg  Status:  Discontinued     400 mg Oral Every 6 hours 02/06/17 0324 02/07/17 1932       Taralynn Quiett 02/25/2017

## 2017-02-26 ENCOUNTER — Inpatient Hospital Stay (HOSPITAL_COMMUNITY): Payer: Medicare Other

## 2017-02-26 LAB — GLUCOSE, CAPILLARY
Glucose-Capillary: 154 mg/dL — ABNORMAL HIGH (ref 65–99)
Glucose-Capillary: 90 mg/dL (ref 65–99)
Glucose-Capillary: 116 mg/dL — ABNORMAL HIGH (ref 65–99)
Glucose-Capillary: 123 mg/dL — ABNORMAL HIGH (ref 65–99)
Glucose-Capillary: 163 mg/dL — ABNORMAL HIGH (ref 65–99)

## 2017-02-26 LAB — BASIC METABOLIC PANEL WITH GFR
Anion gap: 4 — ABNORMAL LOW (ref 5–15)
BUN: 16 mg/dL (ref 6–20)
CO2: 27 mmol/L (ref 22–32)
Calcium: 8.2 mg/dL — ABNORMAL LOW (ref 8.9–10.3)
Chloride: 104 mmol/L (ref 101–111)
Creatinine, Ser: 0.72 mg/dL (ref 0.44–1.00)
GFR calc Af Amer: >60 mL/min
GFR calc non Af Amer: >60 mL/min
Glucose, Bld: 138 mg/dL — ABNORMAL HIGH (ref 65–99)
Potassium: 4.2 mmol/L (ref 3.5–5.1)
Sodium: 135 mmol/L (ref 135–145)

## 2017-02-26 LAB — MAGNESIUM: Magnesium: 2 mg/dL (ref 1.7–2.4)

## 2017-02-26 MED ORDER — SODIUM CHLORIDE 0.9% FLUSH: 10.0000 mL | INTRAVENOUS | 9 refills | Status: DC | PRN

## 2017-02-26 MED ORDER — IOPAMIDOL (ISOVUE-300) INJECTION 61%
INTRAVENOUS | Status: AC
  Filled 2017-02-26: qty 50

## 2017-02-26 MED ORDER — ONDANSETRON 4 MG PO TBDP: 4.0000 mg | ORAL_TABLET | Freq: Four times a day (QID) | ORAL | 0 refills | Status: DC | PRN

## 2017-02-26 MED ORDER — NYSTATIN 100000 UNIT/ML MT SUSP: 5.0000 mL | Freq: Four times a day (QID) | OROMUCOSAL | 0 refills | Status: DC

## 2017-02-26 MED ORDER — FAT EMULSION 20 % IV EMUL
240.0000 mL | INTRAVENOUS | Status: AC
  Administered 2017-02-26: 240 mL via INTRAVENOUS
  Filled 2017-02-26: qty 250

## 2017-02-26 MED ORDER — OXYCODONE-ACETAMINOPHEN 5-325 MG PO TABS: 0.5000 | ORAL_TABLET | ORAL | 0 refills | Status: DC | PRN

## 2017-02-26 MED ORDER — LIDOCAINE HCL (PF) 1 % IJ SOLN
INTRAMUSCULAR | Status: AC
  Filled 2017-02-26: qty 30

## 2017-02-26 MED ORDER — TRACE MINERALS CR-CU-MN-SE-ZN 10-1000-500-60 MCG/ML IV SOLN
INTRAVENOUS | Status: AC
  Administered 2017-02-26: 17:00:00 via INTRAVENOUS
  Filled 2017-02-26: qty 1920

## 2017-02-26 MED ORDER — POTASSIUM CHLORIDE IN NACL 20-0.9 MEQ/L-% IV SOLN: INTRAVENOUS | 12 refills | Status: DC

## 2017-02-26 NOTE — Progress Notes (Signed)
Patient ID: Kaylee Mullins, female   DOB: 1935-12-14, 81 y.o.   MRN: [id number] Assessment s/p Whipple with complication of gastroparesis and persistent n/v   Plan:         -no change from yesterday. GJ tube change out again as jejunostomy tube came back to the stomach. -SIPS and chips -continue TPN for prolonged ileus. -up to chair and ambulate- encouraged to be more proactive today.      - Con't Gtube port to gravity Try abdominal binder to help with abdominal wall pain.  Discussed with IR.     LOS: 20 days     16 Days Post-Op  Chief Complaint/Subjective: Less pain today.  Objective: Vital signs in last 24 hours: Temp:  [98.7 F (37.1 C)-98.9 F (37.2 C)] 98.8 F (37.1 C) (07/13 0624) Pulse Rate:  [80-91] 80 (07/13 0624) Resp:  [16-17] 17 (07/13 0624) BP: (147-166)/(69-97) 166/97 (07/13 0624) SpO2:  [96 %-99 %] 97 % (07/13 0624) Last BM Date: 02/17/17  Intake/Output from previous day: 07/12 0701 - 07/13 0700 In: 1945.8 [I.V.:1875.8] Out: 2850 [Urine:2600; Drains:250] Intake/Output this shift: No intake/output data recorded.  PE: General- In NAD.  Awake and alert. Abdomen-soft, g-tube draining yellow fluid Sl distended.  Less bloated than yesterday.  Lab Results:  No results for input(s): WBC, HGB, HCT, PLT in the last 72 hours. BMET  Recent Labs  02/25/17 0146 02/26/17 0522  NA 136 135  K 4.2 4.2  CL 103 104  CO2 27 27  GLUCOSE 139* 138*  BUN 16 16  CREATININE 0.77 0.72  CALCIUM 8.4* 8.2*   PT/INR No results for input(s): LABPROT, INR in the last 72 hours. Comprehensive Metabolic Panel:    Component Value Date/Time   NA 135 02/26/2017 0522   NA 136 02/25/2017 0146   NA 141 11/13/2016 1329   K 4.2 02/26/2017 0522   K 4.2 02/25/2017 0146   K 3.5 11/13/2016 1329   CL 104 02/26/2017 0522   CL 103 02/25/2017 0146   CO2 27 02/26/2017 0522   CO2 27 02/25/2017 0146   CO2 24 11/13/2016 1329   BUN 16 02/26/2017 0522   BUN 16 02/25/2017 0146   BUN 18.7 11/13/2016 1329   CREATININE 0.72 02/26/2017 0522   CREATININE 0.77 02/25/2017 0146   CREATININE 1.0 11/13/2016 1329   GLUCOSE 138 (H) 02/26/2017 0522   GLUCOSE 139 (H) 02/25/2017 0146   GLUCOSE 78 11/13/2016 1329   CALCIUM 8.2 (L) 02/26/2017 0522   CALCIUM 8.4 (L) 02/25/2017 0146   CALCIUM 9.3 11/13/2016 1329   AST 40 02/25/2017 0146   AST 40 02/22/2017 0506   AST 24 11/13/2016 1329   ALT 38 02/25/2017 0146   ALT 40 02/22/2017 0506   ALT 19 11/13/2016 1329   ALKPHOS 90 02/25/2017 0146   ALKPHOS 82 02/22/2017 0506   ALKPHOS 152 (H) 11/13/2016 1329   BILITOT 0.4 02/25/2017 0146   BILITOT 0.4 02/22/2017 0506   BILITOT 1.44 (H) 11/13/2016 1329   PROT 6.5 02/25/2017 0146   PROT 6.7 02/22/2017 0506   PROT 8.0 11/13/2016 1329   ALBUMIN 2.7 (L) 02/25/2017 0146   ALBUMIN 2.7 (L) 02/22/2017 0506   ALBUMIN 3.5 11/13/2016 1329     Studies/Results: No results found.  Anti-infectives: Anti-infectives    Start     Dose/Rate Route Frequency Ordered Stop   02/16/17 1500  ceFAZolin (ANCEF) IVPB 2g/100 mL premix     2 g 200 mL/hr over 30 Minutes Intravenous To Radiology  02/16/17 1447 02/16/17 1613   02/09/17 0600  erythromycin (EES) 400 MG/5ML suspension 200 mg     200 mg Oral Every 6 hours 02/09/17 0147     02/07/17 0000  erythromycin (EES) 400 MG/5ML suspension 400 mg  Status:  Discontinued     400 mg Oral Every 6 hours 02/07/17 1932 02/09/17 0147   02/06/17 0600  erythromycin ethylsuccinate (EES) 200 MG/5ML suspension 400 mg  Status:  Discontinued     400 mg Oral Every 6 hours 02/06/17 0324 02/07/17 1932       Eren Ryser 02/26/2017

## 2017-02-26 NOTE — Care Management Important Message (Signed)
Important Message  Patient Details  Name: Kaylee Mullins MRN: [id number] Date of Birth: [birth year]   Medicare Important Message Given:  Yes    Orbie Pyo 02/26/2017, 2:28 PM

## 2017-02-26 NOTE — Progress Notes (Signed)
Orthopedic Tech Progress Note Patient Details:  Kaylee Mullins 1936/03/07 [id number]  Ortho Devices Type of Ortho Device: Abdominal binder Ortho Device/Splint Location: abdomen Ortho Device/Splint Interventions: Loanne Drilling, Emmanuela Ghazi 02/26/2017, 9:10 AM

## 2017-02-26 NOTE — Progress Notes (Signed)
PHARMACY - ADULT TOTAL PARENTERAL NUTRITION CONSULT NOTE   Pharmacy Consult:  TPN Indication: Ileus  Patient Measurements: Height: 5\' 4"  (162.6 cm) Weight: 223 lb 9.6 oz (101.4 kg) IBW/kg (Calculated) : 54.7 TPN AdjBW (KG): 63.5 Body mass index is 38.38 kg/m.  Assessment:  52 YOF with severe malnutrition related to renal cell and pancreatic cancer who presented with abdominal mass on 01/12/17.  She is s/p Whipple procedure on the same day for pancreatic cancer.  She was discharged 02/05/17 and readmitted same day for vomiting at SNF. Pharmacy consulted to manage TPN for ileus that prevented oral intake and caused significant nausea and vomiting.   GI: prolonged ileus. Prealbumin down to 12.7, drain O/P 230mL, LBM 7/6.  Erythromycin for gastroparesis, PPI IV, PRN Zofran/Phenergan (QTc 449ms on 6/17). S/p GJ 7/10, malpositioned, to be replaced/repositioned *6/27 EGD - no acute findings, a lot of bilious fluid.  *Repeat GES - no obstruction, no motility of stomach Endo: hypothyroid on Synthroid.  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  Continue 20% lipid emulsion at 20 ml/hr over 12 hrs.  TPN provides 1843 kCal and 96gm of protein per day, meeting 100% of needs. - Daily multivitamin in TPN - Trace elements in TPN every other day, next 7/13 - Continue resistant cyclic SSI + 75 units regular insulin in TPN - NS with 37mEq of KCl at 50 ml/hr per MD - F/U with initiating tube feed - Consider increasing Lovenox to 100mg  SQ Q12H given increasing weight   Jerrett Baldinger D. Mina Marble, PharmD, BCPS Pager:  [phone] 02/26/2017, 8:24 AM

## 2017-02-26 NOTE — Discharge Summary (Addendum)
Physician Discharge Summary  Patient ID: Kaylee Mullins MRN: [id number] DOB/AGE: [birth year] 81 y.o.  Admit date: 02/05/2017 Discharge date: 03/29/2017  Admission Diagnoses: Patient Active Problem List   Diagnosis Date Noted  . Malnutrition of moderate degree 02/07/2017  . Gastroparesis 02/06/2017  . Adenocarcinoma ([place]) 01/12/2017  . 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 Essential hypertension 02/11/2016  . Lumbar stenosis with neurogenic claudication 02/10/2016  . Hyperlipidemia 02/10/2016  . Hypothyroidism 02/10/2016  . Musculoskeletal neck pain 12/13/2014  . Muscular pain 12/13/2014  . Numbness   . Acute CVA (cerebrovascular accident) ([place]) 09/16/2014  . Right renal mass 09/13/2014  . Cerebral infarction due to unspecified mechanism   . CVA (cerebral infarction) 09/11/2014  . Osteoarthritis 09/11/2014  . Postsurgical hypothyroidism 09/11/2014  . Uncontrolled hypertension 09/11/2014  . History of CVA (cerebral vascular accident) ([place]) 09/11/2014  . Diabetes mellitus without complication ([place]) 79/78/4696  . 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  She had significant gastroparesis.  She would do okay for several days and then would have nausea and vomiting.  She went to a nursing home but came back within 24 hours for nausea and vomiting  A G-tube was placed by interventional radiology and this helped alleviate her vomiting.  She was on TNA throughout this time period. She had no fevers or chills.  She was maintained on therapeutic Lovenox for DVT found on her previous admission.  TNA was cycled.  Interventional radiology then converted her G-tube to a GJ tube.  Unfortunately the J-tube port migrated up into her stomach rapidly.  They  have repositioned it, but it relocated again.    She was taken for a surgical J tube 03/10/2017.  Despite 2 weeks of attempts, we were unable to get the patient above 20 mL/hr of tube feeds without significant GI distress.  She would have nausea and pain and occasional vomiting despite g tube being to gravity.  She was stooling better with brown stools and passing gas, but was not able to progress on tube feeds.  She has had some water/small amounts of clears as desired, but these will come out G tube.  This has remained to gravity.    She developed a rectus sheath hematoma.  Her anticoagulation was stopped and an IVC filter was placed.  This has remained stable.  A CT confirmed that there was no obstruction or abscess prior to d/c.  She is doing much better off enteral nutrition.  Plan to give her several week break prior to starting tube feeds again.  Her staples were removed, and some brownish drainage came from the inferior aspect of wound.  No significant cavity could be probed.  Consults: GI and interventional cardiology  Significant Diagnostic Studies: labs: Cr stable at 0.72.  LFTs normal except for albumin 2.7. CBC with WBCs 7.2 and HCT 30.4  Treatments: IV hydration, cardiac meds: antihypertensives, insulin in TNA TPN and procedures: g tube, GJ tube  Discharge Exam: Blood pressure 139/79, pulse 88, temperature 98.7 F (37.1 C), temperature source Oral, resp. rate 16, height 5\' 4"  (1.626 m), weight 96.4 kg (212 lb 8.4 oz), SpO2 100 %. General appearance: alert, cooperative and mild distress Eyes: anicteric, conjunctive normal Resp: breathing comfortably Cardio: regular rate and rhythm GI: soft, mild upper abdominal  bloating in site of stomach.  non tender Extremities: +1 edema.  warm well perfused.  Disposition: 03-Skilled Nursing Facility  Discharge Instructions    Call MD for:  persistant nausea and vomiting    Complete by:  As directed    Call MD for:  persistant nausea and  vomiting    Complete by:  As directed    Call MD for:  redness, tenderness, or signs of infection (pain, swelling, redness, odor or green/yellow discharge around incision site)    Complete by:  As directed    Call MD for:  redness, tenderness, or signs of infection (pain, swelling, redness, odor or green/yellow discharge around incision site)    Complete by:  As directed    Call MD for:  severe uncontrolled pain    Complete by:  As directed    Call MD for:  severe uncontrolled pain    Complete by:  As directed    Call MD for:  temperature >100.4    Complete by:  As directed    Call MD for:  temperature >100.4    Complete by:  As directed    Change dressing (specify)    Complete by:  As directed    Flush all tubes with 50 ml saline or water BID.   G tube to gravity unless patient ambulating or going to bathroom/getting bathed.    Ok to give meds in G tube, but not J tube.   Change dressing (specify)    Complete by:  As directed    Dressing change: one times per day using dry gauze to midline and around drains.   Diet - low sodium heart healthy    Complete by:  As directed    Discharge diet:    Complete by:  As directed    Sips of clears. OK to leave G tube clamped for up to 1 hour 3 times per day for meds, PT, bathing.    DO NOT give any medications through J tube. Flush all tubes with 50 mL water TID.   Increase activity slowly    Complete by:  As directed    Increase activity slowly    Complete by:  As directed    TPN per pharmacy consult    Complete by:  As directed      Allergies as of 03/29/2017      Reactions   Hydralazine Hcl Shortness Of Breath   Pt had severe respiratory distress after receiving a dose   Darvon [propoxyphene Hcl] Other (See Comments)   Hallucinations   Nyquil Multi-symptom [pseudoeph-doxylamine-dm-apap] Other (See Comments)   Makes pt not "feel right in her head"   Metformin And Related Diarrhea      Medication List    STOP taking these  medications   erythromycin ethylsuccinate 200 MG/5ML suspension Commonly known as:  EES   potassium chloride SA 20 MEQ tablet Commonly known as:  K-DUR,KLOR-CON   rosuvastatin 20 MG tablet Commonly known as:  CRESTOR     TAKE these medications   0.9 % NaCl with KCl 20 mEq / L 20-0.9 MEQ/L-% 50 ml/hr   acetaminophen 500 MG tablet Commonly known as:  TYLENOL Take 500-1,000 mg by mouth every 6 (six) hours as needed for mild pain or moderate pain (depends on pain if takes 1-2 tablets).   albuterol (2.5 MG/3ML) 0.083% nebulizer solution Commonly known as:  PROVENTIL Take 3 mLs (2.5 mg total) by nebulization every 4 (four) hours as needed for wheezing or  shortness of breath.   amLODipine 10 MG tablet Commonly known as:  NORVASC Take 1 tablet (10 mg total) by mouth daily.   aspirin 81 MG chewable tablet Commonly known as:  ASPIRIN CHILDRENS Chew 1 tablet (81 mg total) by mouth daily.   enoxaparin 100 MG/ML injection Commonly known as:  LOVENOX Inject 0.9 mLs (90 mg total) into the skin every 12 (twelve) hours.   furosemide 40 MG tablet Commonly known as:  LASIX Take 40 mg by mouth daily as needed (leg swelling).   insulin aspart 100 UNIT/ML injection Commonly known as:  novoLOG Inject 0-15 Units into the skin every 4 (four) hours.   irbesartan 300 MG tablet Commonly known as:  AVAPRO Take 1 tablet (300 mg total) by mouth daily.   levothyroxine 200 MCG tablet Commonly known as:  SYNTHROID, LEVOTHROID Take 200 mcg by mouth daily before breakfast. For hypothyroidism   metoprolol succinate 100 MG 24 hr tablet Commonly known as:  TOPROL-XL Take 100 mg by mouth daily. Take with or immediately following a meal.   NIFEdipine 30 MG 24 hr tablet Commonly known as:  PROCARDIA-XL/ADALAT CC Take 1 tablet (30 mg total) by mouth 2 (two) times daily as needed (systolic BP over 299 mmHg).   nystatin 100000 UNIT/ML suspension Commonly known as:  MYCOSTATIN Take 5 mLs (500,000  Units total) by mouth 4 (four) times daily.   nystatin 100000 UNIT/ML suspension Commonly known as:  MYCOSTATIN Use as directed 5 mLs (500,000 Units total) in the mouth or throat 4 (four) times daily.   ondansetron 4 MG disintegrating tablet Commonly known as:  ZOFRAN-ODT Take 1 tablet (4 mg total) by mouth every 6 (six) hours as needed for nausea.   oxyCODONE 5 MG/5ML solution Commonly known as:  ROXICODONE Take 5-10 mLs (5-10 mg total) by mouth every 4 (four) hours as needed for moderate pain or severe pain.   oxyCODONE-acetaminophen 5-325 MG tablet Commonly known as:  PERCOCET/ROXICET Take 0.5-1 tablets by mouth every 4 (four) hours as needed for moderate pain. Depends on pain if takes 0.5-1 tablet What changed:  how much to take  additional instructions   pantoprazole 40 MG tablet Commonly known as:  PROTONIX Take 1 tablet (40 mg total) by mouth at bedtime. What changed:  Another medication with the same name was added. Make sure you understand how and when to take each.   pantoprazole 40 MG tablet Commonly known as:  PROTONIX Take 1 tablet (40 mg total) by mouth daily. What changed:  You were already taking a medication with the same name, and this prescription was added. Make sure you understand how and when to take each.   polyethylene glycol packet Commonly known as:  MIRALAX / GLYCOLAX Take 17 g by mouth daily.   sodium chloride flush 0.9 % Soln Commonly known as:  NS 10-40 mLs by Intracatheter route as needed (flush).   telmisartan-hydrochlorothiazide 80-25 MG tablet Commonly known as:  MICARDIS HCT Take 1 tablet by mouth daily.       Contact information for follow-up providers    Stark Klein, MD Follow up in 2 week(s).   Specialty:  General Surgery Contact information: [place] [address] [phone]            Contact information for after-discharge care    Destination    [facility]  Follow up.   Specialty:  [place] information: [address] [place]  [place] [phone]                  Signed: Stark Klein 03/29/2017, 8:57 AM

## 2017-02-26 NOTE — Clinical Social Work Note (Signed)
CSW received a call from RN on 6N stating family wants pt to go to [facility] at d/c. However, [facility] will be unable to take pt due to the inability to administer TPN. CSW called pt's son and explained and provided the facility options ([facility] or [facility]). Pt's brother will talk to his mom and sister and call CSW back. Per RN pt will get feeding tube exchanged out today.  [place], [place]

## 2017-02-27 LAB — GLUCOSE, CAPILLARY
Glucose-Capillary: 106 mg/dL — ABNORMAL HIGH (ref 65–99)
Glucose-Capillary: 119 mg/dL — ABNORMAL HIGH (ref 65–99)
Glucose-Capillary: 120 mg/dL — ABNORMAL HIGH (ref 65–99)
Glucose-Capillary: 123 mg/dL — ABNORMAL HIGH (ref 65–99)
Glucose-Capillary: 124 mg/dL — ABNORMAL HIGH (ref 65–99)
Glucose-Capillary: 129 mg/dL — ABNORMAL HIGH (ref 65–99)

## 2017-02-27 MED ORDER — M.V.I. ADULT IV INJ
INTRAVENOUS | Status: AC
  Administered 2017-02-27: 19:00:00 via INTRAVENOUS
  Filled 2017-02-27: qty 1920

## 2017-02-27 MED ORDER — FAT EMULSION 20 % IV EMUL
240.0000 mL | INTRAVENOUS | Status: AC
  Administered 2017-02-27: 240 mL via INTRAVENOUS
  Filled 2017-02-27: qty 250

## 2017-02-27 MED ORDER — INSULIN ASPART 100 UNIT/ML ~~LOC~~ SOLN
0.0000 [IU] | SUBCUTANEOUS | Status: DC
  Administered 2017-02-27: 3 [IU] via SUBCUTANEOUS
  Administered 2017-02-28 (×2): 4 [IU] via SUBCUTANEOUS
  Administered 2017-02-28 (×2): 3 [IU] via SUBCUTANEOUS
  Administered 2017-03-01 (×3): 4 [IU] via SUBCUTANEOUS
  Administered 2017-03-01 (×2): 7 [IU] via SUBCUTANEOUS
  Administered 2017-03-01: 3 [IU] via SUBCUTANEOUS
  Administered 2017-03-01: 4 [IU] via SUBCUTANEOUS
  Administered 2017-03-02: 3 [IU] via SUBCUTANEOUS
  Administered 2017-03-02 (×2): 4 [IU] via SUBCUTANEOUS
  Administered 2017-03-02: 3 [IU] via SUBCUTANEOUS
  Administered 2017-03-02 – 2017-03-03 (×2): 4 [IU] via SUBCUTANEOUS
  Administered 2017-03-03: 3 [IU] via SUBCUTANEOUS
  Administered 2017-03-03: 7 [IU] via SUBCUTANEOUS
  Administered 2017-03-03 (×2): 4 [IU] via SUBCUTANEOUS
  Administered 2017-03-03 – 2017-03-04 (×2): 3 [IU] via SUBCUTANEOUS
  Administered 2017-03-04: 4 [IU] via SUBCUTANEOUS
  Administered 2017-03-06: 3 [IU] via SUBCUTANEOUS
  Administered 2017-03-07: 4 [IU] via SUBCUTANEOUS

## 2017-02-27 NOTE — Progress Notes (Signed)
Patient ID: Kaylee Mullins, female   DOB: 09/23/1935, 81 y.o.   MRN: [id number] Patient ID: FIA HEBERT, female   DOB: [birth year], 81 y.o.   MRN: [id number] Assessment s/p Whipple with complication of gastroparesis and persistent n/v   Plan:         -no change from yesterday. GJ tube change out again as jejunostomy tube came back to the stomach. -SIPS and chips -continue TPN for prolonged ileus. -up to chair and ambulate- encouraged to be more proactive today.      - Con't Gtube port to gravity Try abdominal binder to help with abdominal wall pain.  Patient reports that interventional radiology discussed tube repositioning but felt that it would not stay in place and recommended a surgical jejunostomy tube. Patient does not want the tube repositioned at this time after that conversation and is interested in a surgical feeding tube.   LOS: 21 days     17 Days Post-Op  Chief Complaint/Subjective: Occasional nausea no change. Some localized pain right side of her pannus and Lovenox injection site.  Objective: Vital signs in last 24 hours: Temp:  [98.2 F (36.8 C)-98.6 F (37 C)] 98.2 F (36.8 C) (07/14 0533) Pulse Rate:  [61-77] 77 (07/14 0533) Resp:  [16-18] 18 (07/14 0533) BP: (151-166)/(90-110) 163/110 (07/14 0533) SpO2:  [98 %-100 %] 98 % (07/14 0533) Last BM Date: 02/17/17  Intake/Output from previous day: 07/13 0701 - 07/14 0700 In: 1342.7 [I.V.:1312.7] Out: 1725 [Urine:1400; Drains:325] Intake/Output this shift: No intake/output data recorded.  PE: General- In NAD.  Awake and alert. Abdomen-soft, g-tube draining yellow fluid 2-3 cm area of induration of skin right pannus without erythema  Lab Results:  No results for input(s): WBC, HGB, HCT, PLT in the last 72 hours. BMET  Recent Labs  02/25/17 0146 02/26/17 0522  NA 136 135  K 4.2 4.2  CL 103 104  CO2 27 27  GLUCOSE 139* 138*  BUN 16 16  CREATININE 0.77 0.72  CALCIUM 8.4* 8.2*   PT/INR No  results for input(s): LABPROT, INR in the last 72 hours. Comprehensive Metabolic Panel:    Component Value Date/Time   NA 135 02/26/2017 0522   NA 136 02/25/2017 0146   NA 141 11/13/2016 1329   K 4.2 02/26/2017 0522   K 4.2 02/25/2017 0146   K 3.5 11/13/2016 1329   CL 104 02/26/2017 0522   CL 103 02/25/2017 0146   CO2 27 02/26/2017 0522   CO2 27 02/25/2017 0146   CO2 24 11/13/2016 1329   BUN 16 02/26/2017 0522   BUN 16 02/25/2017 0146   BUN 18.7 11/13/2016 1329   CREATININE 0.72 02/26/2017 0522   CREATININE 0.77 02/25/2017 0146   CREATININE 1.0 11/13/2016 1329   GLUCOSE 138 (H) 02/26/2017 0522   GLUCOSE 139 (H) 02/25/2017 0146   GLUCOSE 78 11/13/2016 1329   CALCIUM 8.2 (L) 02/26/2017 0522   CALCIUM 8.4 (L) 02/25/2017 0146   CALCIUM 9.3 11/13/2016 1329   AST 40 02/25/2017 0146   AST 40 02/22/2017 0506   AST 24 11/13/2016 1329   ALT 38 02/25/2017 0146   ALT 40 02/22/2017 0506   ALT 19 11/13/2016 1329   ALKPHOS 90 02/25/2017 0146   ALKPHOS 82 02/22/2017 0506   ALKPHOS 152 (H) 11/13/2016 1329   BILITOT 0.4 02/25/2017 0146   BILITOT 0.4 02/22/2017 0506   BILITOT 1.44 (H) 11/13/2016 1329   PROT 6.5 02/25/2017 0146   PROT 6.7 02/22/2017 0506  PROT 8.0 11/13/2016 1329   ALBUMIN 2.7 (L) 02/25/2017 0146   ALBUMIN 2.7 (L) 02/22/2017 0506   ALBUMIN 3.5 11/13/2016 1329     Studies/Results: No results found.  Anti-infectives: Anti-infectives    Start     Dose/Rate Route Frequency Ordered Stop   02/16/17 1500  ceFAZolin (ANCEF) IVPB 2g/100 mL premix     2 g 200 mL/hr over 30 Minutes Intravenous To Radiology 02/16/17 1447 02/16/17 1613   02/09/17 0600  erythromycin (EES) 400 MG/5ML suspension 200 mg     200 mg Oral Every 6 hours 02/09/17 0147     02/07/17 0000  erythromycin (EES) 400 MG/5ML suspension 400 mg  Status:  Discontinued     400 mg Oral Every 6 hours 02/07/17 1932 02/09/17 0147   02/06/17 0600  erythromycin ethylsuccinate (EES) 200 MG/5ML suspension 400 mg   Status:  Discontinued     400 mg Oral Every 6 hours 02/06/17 0324 02/07/17 1932       Lisette Mancebo T 02/27/2017

## 2017-02-27 NOTE — Progress Notes (Signed)
PHARMACY - ADULT TOTAL PARENTERAL NUTRITION CONSULT NOTE   Pharmacy Consult:  TPN Indication: Ileus  Patient Measurements: Height: 5\' 4"  (162.6 cm) Weight: 223 lb 9.6 oz (101.4 kg) IBW/kg (Calculated) : 54.7 TPN AdjBW (KG): 63.5 Body mass index is 38.38 kg/m.  Assessment:  70 YOF with severe malnutrition related to renal cell and pancreatic cancer who presented with abdominal mass on 01/12/17.  She is s/p Whipple procedure on the same day for pancreatic cancer.  She was discharged 02/05/17 and readmitted same day for vomiting at SNF. Pharmacy consulted to manage TPN for ileus that prevented oral intake and caused significant nausea and vomiting.   GI: prolonged ileus. Prealbumin down to 12.7, drain O/P 267mL, LBM 7/6.  Erythromycin for gastroparesis, PPI IV, PRN Zofran/Phenergan (QTc 450ms on 6/17). S/p GJ 7/10, malpositioned, to be replaced with surgical FT *6/27 EGD - no acute findings, a lot of bilious fluid.  *Repeat GES - no obstruction, no motility of stomach Endo: hypothyroid on Synthroid.  DM not on med PTA - CBGs controlled Insulin requirements in the past 24 hours: 12 units SSI + 75 units in TPN Lytes: all WNL on 7/13 Renal: SCr stable, BUN WNL - UOP 1.1 ml/kg/hr, NS with 20K at 50 ml/hr Pulm: stable on RA Cards: HTN - BP elevated, HR controlled - Norvasc, irbesartan, Toprol, PRN nifedipine XL  AC: Lovenox for new DVT - mild anemia, plts elevated Hepatobil: LFTs / tbili / TG WNL Neuro: hx CVA 2016 with residual L-hand and foot weakness - PRN Dilaudid/oxy ID: Nystatin for thrush - afebrile, WBC WNL Best Practices: Lovenox TPN Access: triple lumen PICC placed 6/11 - replaced 02/02/17 TPN start date: 01/23/17   Nutritional Goals (per RD recommendation on 7/3): 1800-1950 kCal and 82-93 g protein per day  Current Nutrition: Sips and ice chips   TPN   Plan:   - Continue Clinimix E 5/15 at 80 ml/hr and 20% ILE at 20 ml/hr over 12 hrs.  TPN provides 1843 kCal and 96gm of  protein per day, meeting 100% of needs. - Daily multivitamin in TPN - Trace elements in TPN every other day, next 7/15 - Continue resistant SSI Q4H + reduce insulin in TPN to 65 units - NS with 72mEq of KCl at 50 ml/hr per MD - F/U with placement of surgical FT to initiate tube feed - Consider increasing Lovenox to 100mg  SQ Q12H given increasing weight   [place] D. Mina Marble, PharmD, BCPS Pager:  [phone] 02/27/2017, 10:25 AM

## 2017-02-27 NOTE — Progress Notes (Signed)
Pt BP 171/74 rechecked BP 162/84, asymptomatic will continue to monitor.

## 2017-02-28 LAB — GLUCOSE, CAPILLARY
Glucose-Capillary: 142 mg/dL — ABNORMAL HIGH (ref 65–99)
Glucose-Capillary: 115 mg/dL — ABNORMAL HIGH (ref 65–99)
Glucose-Capillary: 156 mg/dL — ABNORMAL HIGH (ref 65–99)
Glucose-Capillary: 126 mg/dL — ABNORMAL HIGH (ref 65–99)
Glucose-Capillary: 108 mg/dL — ABNORMAL HIGH (ref 65–99)
Glucose-Capillary: 185 mg/dL — ABNORMAL HIGH (ref 65–99)

## 2017-02-28 MED ORDER — DIAZEPAM 5 MG PO TABS
5.0000 mg | ORAL_TABLET | Freq: Once | ORAL | Status: AC
  Administered 2017-02-28: 5 mg via ORAL
  Filled 2017-02-28: qty 1

## 2017-02-28 MED ORDER — MAGNESIUM SULFATE IN D5W 1-5 GM/100ML-% IV SOLN
1.0000 g | Freq: Once | INTRAVENOUS | Status: AC
  Administered 2017-02-28: 1 g via INTRAVENOUS
  Filled 2017-02-28: qty 100

## 2017-02-28 MED ORDER — TRACE MINERALS CR-CU-MN-SE-ZN 10-1000-500-60 MCG/ML IV SOLN
INTRAVENOUS | Status: AC
  Administered 2017-02-28: 18:00:00 via INTRAVENOUS
  Filled 2017-02-28: qty 1920

## 2017-02-28 MED ORDER — FAT EMULSION 20 % IV EMUL
240.0000 mL | INTRAVENOUS | Status: AC
  Administered 2017-02-28: 240 mL via INTRAVENOUS
  Filled 2017-02-28: qty 250

## 2017-02-28 NOTE — Progress Notes (Signed)
Patient ID: Kaylee Mullins, female   DOB: 16-Sep-1935, 81 y.o.   MRN: [id number] 18 Days Post-Op   Subjective: No change. Vomited once yesterday. Feels okay this morning. Had a small bowel movement.  Objective: Vital signs in last 24 hours: Temp:  [98.2 F (36.8 C)-99 F (37.2 C)] 98.7 F (37.1 C) (07/15 0425) Pulse Rate:  [68-72] 70 (07/15 0425) Resp:  [17-18] 18 (07/15 0425) BP: (155-171)/(50-84) 156/50 (07/15 0425) SpO2:  [100 %] 100 % (07/15 0425) Last BM Date: 02/17/17  Intake/Output from previous day: 07/14 0701 - 07/15 0700 In: 3240 [I.V.:3210] Out: 1725 [Urine:1500; Drains:225] Intake/Output this shift: No intake/output data recorded.  General appearance: alert, cooperative and no distress GI: normal findings: soft, non-tender and Nondistended  Lab Results:  No results for input(s): WBC, HGB, HCT, PLT in the last 72 hours. BMET  Recent Labs  02/26/17 0522  NA 135  K 4.2  CL 104  CO2 27  GLUCOSE 138*  BUN 16  CREATININE 0.72  CALCIUM 8.2*     Studies/Results: No results found.  Anti-infectives: Anti-infectives    Start     Dose/Rate Route Frequency Ordered Stop   02/16/17 1500  ceFAZolin (ANCEF) IVPB 2g/100 mL premix     2 g 200 mL/hr over 30 Minutes Intravenous To Radiology 02/16/17 1447 02/16/17 1613   02/09/17 0600  erythromycin (EES) 400 MG/5ML suspension 200 mg     200 mg Oral Every 6 hours 02/09/17 0147     02/07/17 0000  erythromycin (EES) 400 MG/5ML suspension 400 mg  Status:  Discontinued     400 mg Oral Every 6 hours 02/07/17 1932 02/09/17 0147   02/06/17 0600  erythromycin ethylsuccinate (EES) 200 MG/5ML suspension 400 mg  Status:  Discontinued     400 mg Oral Every 6 hours 02/06/17 0324 02/07/17 1932      Assessment/Plan:    gastroparesis post-Whipple   -no change IR does not feel that G J-tube will stay positioned and small intestine and communicated this to patient who does not want the tube repositioned again. -SIPS and  chips -continue TPN for prolonged ileus. -up to chair and ambulate- encouraged to be more proactive today. - Con't Gtube port to gravity Try abdominal binder to help with abdominal wall pain.  Patient reports that interventional radiology discussed tube repositioning but felt that it would not stay in place and recommended a surgical jejunostomy tube. Patient does not want the tube repositioned at this time after that conversation and is interested in a surgical feeding tube.    LOS: 22 days    [name] T 02/28/2017

## 2017-02-28 NOTE — Progress Notes (Signed)
PHARMACY - ADULT TOTAL PARENTERAL NUTRITION CONSULT NOTE   Pharmacy Consult:  TPN Indication: Ileus  Patient Measurements: Height: 5\' 4"  (162.6 cm) Weight: 223 lb 9.6 oz (101.4 kg) IBW/kg (Calculated) : 54.7 TPN AdjBW (KG): 63.5 Body mass index is 38.38 kg/m.  Assessment:  80 YOF with severe malnutrition related to renal cell and pancreatic cancer who presented with abdominal mass on 01/12/17.  She is s/p Whipple procedure on the same day for pancreatic cancer.  She was discharged 02/05/17 and readmitted same day for vomiting at SNF. Pharmacy consulted to manage TPN for ileus that prevented oral intake and caused significant nausea and vomiting.   GI: prolonged ileus. Prealbumin down to 12.7, drain O/P 216mL, LBM 7/6, emesis x1.  Erythromycin for gastroparesis, PPI IV, PRN Zofran/Phenergan (QTc 472ms on 6/17). S/p GJ 7/10, malpositioned, to be replaced with surgical FT *6/27 EGD - no acute findings, a lot of bilious fluid.  *Repeat GES - no obstruction, no motility of stomach  Endo: hypothyroid on Synthroid.  DM not on med PTA - CBGs controlled Insulin requirements in the past 24 hours: 6 units SSI + 65 units in TPN Lytes: all WNL on 7/13 Renal: SCr stable, BUN WNL - UOP 0.6 ml/kg/hr, NS with 20K at 50 ml/hr Pulm: stable on RA Cards: HTN - BP elevated, HR controlled - Norvasc, irbesartan, Toprol, PRN nifedipine XL  AC: Lovenox for new DVT - mild anemia, plts elevated Hepatobil: LFTs / tbili / TG WNL Neuro: hx CVA 2016 with residual L-hand and foot weakness - PRN Dilaudid/oxy ID: Nystatin for thrush - afebrile, WBC WNL Best Practices: Lovenox TPN Access: triple lumen PICC placed 6/11 - replaced 02/02/17 TPN start date: 01/23/17   Nutritional Goals (per RD recommendation on 7/3): 1800-1950 kCal and 82-93 g protein per day  Current Nutrition: Sips and ice chips   TPN   Plan:   - Continue Clinimix E 5/15 at 80 ml/hr and 20% ILE at 20 ml/hr over 12 hrs.  TPN provides 1843 kCal and  96gm of protein per day, meeting 100% of needs. - Daily multivitamin in TPN - Trace elements in TPN every other day, next 7/15 - Continue resistant SSI Q4H + reduce insulin in TPN to 60 units - NS with 79mEq of KCl at 50 ml/hr per MD - F/U with placement of surgical FT to initiate tube feed - Mag sulfate 1gm IV x 1 - F/U AM labs - Consider increasing Lovenox to 100mg  SQ Q12H given increasing weight   Gursimran Litaker D. Mina Marble, PharmD, BCPS Pager:  [phone] 02/28/2017, 9:09 AM

## 2017-02-28 NOTE — Progress Notes (Signed)
Patient c/o severe LLQ abd pain and spasm, dilaudid 1 mg IV given at 1938 with little effect.Dr Rosendo Gros notified with new order.

## 2017-03-01 LAB — DIFFERENTIAL
Basophils Absolute: 0 10*3/uL (ref 0.0–0.1)
Basophils Relative: 0 %
Eosinophils Absolute: 0 10*3/uL (ref 0.0–0.7)
Eosinophils Relative: 0 %
Lymphocytes Relative: 10 %
Lymphs Abs: 1.3 10*3/uL (ref 0.7–4.0)
Monocytes Absolute: 1 10*3/uL (ref 0.1–1.0)
Monocytes Relative: 8 %
Neutro Abs: 10.6 10*3/uL — ABNORMAL HIGH (ref 1.7–7.7)
Neutrophils Relative %: 82 %

## 2017-03-01 LAB — COMPREHENSIVE METABOLIC PANEL
ALT: 28 U/L (ref 14–54)
AST: 30 U/L (ref 15–41)
Albumin: 2.6 g/dL — ABNORMAL LOW (ref 3.5–5.0)
Alkaline Phosphatase: 76 U/L (ref 38–126)
Anion gap: 7 (ref 5–15)
BUN: 29 mg/dL — ABNORMAL HIGH (ref 6–20)
CO2: 23 mmol/L (ref 22–32)
Calcium: 8.1 mg/dL — ABNORMAL LOW (ref 8.9–10.3)
Chloride: 104 mmol/L (ref 101–111)
Creatinine, Ser: 1.47 mg/dL — ABNORMAL HIGH (ref 0.44–1.00)
GFR calc Af Amer: 38 mL/min — ABNORMAL LOW (ref >60)
GFR calc non Af Amer: 33 mL/min — ABNORMAL LOW (ref >60)
Glucose, Bld: 231 mg/dL — ABNORMAL HIGH (ref 65–99)
Potassium: 4.9 mmol/L (ref 3.5–5.1)
Sodium: 134 mmol/L — ABNORMAL LOW (ref 135–145)
Total Bilirubin: 0.4 mg/dL (ref 0.3–1.2)
Total Protein: 5.9 g/dL — ABNORMAL LOW (ref 6.5–8.1)

## 2017-03-01 LAB — CBC
HCT: 25.4 % — ABNORMAL LOW (ref 36.0–46.0)
Hemoglobin: 8 g/dL — ABNORMAL LOW (ref 12.0–15.0)
MCH: 30 pg (ref 26.0–34.0)
MCHC: 31.5 g/dL (ref 30.0–36.0)
MCV: 95.1 fL (ref 78.0–100.0)
Platelets: 336 10*3/uL (ref 150–400)
RBC: 2.67 MIL/uL — ABNORMAL LOW (ref 3.87–5.11)
RDW: 15.2 % (ref 11.5–15.5)
WBC: 12.9 10*3/uL — ABNORMAL HIGH (ref 4.0–10.5)

## 2017-03-01 LAB — GLUCOSE, CAPILLARY
Glucose-Capillary: 129 mg/dL — ABNORMAL HIGH (ref 65–99)
Glucose-Capillary: 153 mg/dL — ABNORMAL HIGH (ref 65–99)
Glucose-Capillary: 156 mg/dL — ABNORMAL HIGH (ref 65–99)
Glucose-Capillary: 172 mg/dL — ABNORMAL HIGH (ref 65–99)
Glucose-Capillary: 191 mg/dL — ABNORMAL HIGH (ref 65–99)
Glucose-Capillary: 229 mg/dL — ABNORMAL HIGH (ref 65–99)
Glucose-Capillary: 242 mg/dL — ABNORMAL HIGH (ref 65–99)

## 2017-03-01 LAB — PHOSPHORUS: Phosphorus: 3.8 mg/dL (ref 2.5–4.6)

## 2017-03-01 LAB — PREALBUMIN: Prealbumin: 15.6 mg/dL — ABNORMAL LOW (ref 18–38)

## 2017-03-01 LAB — MAGNESIUM: Magnesium: 1.9 mg/dL (ref 1.7–2.4)

## 2017-03-01 LAB — TRIGLYCERIDES: Triglycerides: 84 mg/dL (ref <150)

## 2017-03-01 MED ORDER — SODIUM CHLORIDE 0.9 % IV SOLN
INTRAVENOUS | Status: AC
  Administered 2017-03-01 – 2017-03-21 (×19): via INTRAVENOUS

## 2017-03-01 MED ORDER — FAT EMULSION 20 % IV EMUL
240.0000 mL | INTRAVENOUS | Status: AC
  Administered 2017-03-01: 240 mL via INTRAVENOUS
  Filled 2017-03-01: qty 250

## 2017-03-01 MED ORDER — MAGNESIUM SULFATE 2 GM/50ML IV SOLN
2.0000 g | Freq: Once | INTRAVENOUS | Status: AC
  Administered 2017-03-01: 2 g via INTRAVENOUS
  Filled 2017-03-01: qty 50

## 2017-03-01 MED ORDER — DEXTROSE 5 % IV SOLN
1000.0000 mg | Freq: Three times a day (TID) | INTRAVENOUS | Status: DC | PRN
  Administered 2017-03-01 – 2017-03-21 (×20): 1000 mg via INTRAVENOUS
  Filled 2017-03-01 (×22): qty 10

## 2017-03-01 MED ORDER — M.V.I. ADULT IV INJ
INTRAVENOUS | Status: AC
  Administered 2017-03-01: 18:00:00 via INTRAVENOUS
  Filled 2017-03-01: qty 1920

## 2017-03-01 MED ORDER — ENOXAPARIN SODIUM 100 MG/ML ~~LOC~~ SOLN
100.0000 mg | Freq: Two times a day (BID) | SUBCUTANEOUS | Status: DC
  Administered 2017-03-01: 100 mg via SUBCUTANEOUS
  Filled 2017-03-01: qty 1

## 2017-03-01 NOTE — Progress Notes (Signed)
19 Days Post-Op   Subjective/Chief Complaint: C/O muscle spasm LLQ abdominal wall. Valium did not help much. Some nausea.  Objective: Vital signs in last 24 hours: Temp:  [97.9 F (36.6 C)-98.7 F (37.1 C)] 98 F (36.7 C) (07/16 0358) Pulse Rate:  [68-99] 99 (07/16 0358) Resp:  [18-19] 19 (07/16 0358) BP: (137-179)/(77-86) 160/78 (07/16 0358) SpO2:  [100 %] 100 % (07/16 0358) Last BM Date:  (pt states small bm on 02/27/17)  Intake/Output from previous day: 07/15 0701 - 07/16 0700 In: 274.7 [I.V.:234.7] Out: 2850 [Urine:2700; Drains:150] Intake/Output this shift: No intake/output data recorded.  General appearance: cooperative Resp: clear to auscultation bilaterally Cardio: regular rate and rhythm GI: soft, GJ tube draining, not sig tender but LLQ muscle pain area noted  Lab Results:   Recent Labs  03/01/17 0509  WBC 12.9*  HGB 8.0*  HCT 25.4*  PLT 336   BMET  Recent Labs  03/01/17 0509  NA 134*  K 4.9  CL 104  CO2 23  GLUCOSE 231*  BUN 29*  CREATININE 1.47*  CALCIUM 8.1*   PT/INR No results for input(s): LABPROT, INR in the last 72 hours. ABG No results for input(s): PHART, HCO3 in the last 72 hours.  Invalid input(s): PCO2, PO2  Studies/Results: No results found.  Anti-infectives: Anti-infectives    Start     Dose/Rate Route Frequency Ordered Stop   02/16/17 1500  ceFAZolin (ANCEF) IVPB 2g/100 mL premix     2 g 200 mL/hr over 30 Minutes Intravenous To Radiology 02/16/17 1447 02/16/17 1613   02/09/17 0600  erythromycin (EES) 400 MG/5ML suspension 200 mg     200 mg Oral Every 6 hours 02/09/17 0147     02/07/17 0000  erythromycin (EES) 400 MG/5ML suspension 400 mg  Status:  Discontinued     400 mg Oral Every 6 hours 02/07/17 1932 02/09/17 0147   02/06/17 0600  erythromycin ethylsuccinate (EES) 200 MG/5ML suspension 400 mg  Status:  Discontinued     400 mg Oral Every 6 hours 02/06/17 0324 02/07/17 1932      Assessment/Plan: Gastroparesis  post-Whipple      - no change      - IR does not feel that G J-tube will stay positioned and small intestine and communicated  this to patient who does not want the tube repositioned again. -SIPS and chips -continue TPN for prolonged ileus/protein calorie malnutrition - Con't Gtube port to gravity      - May need surgical jejunostomy for TF - will defer to Dr. Marlowe Aschoff decision on this CRT elevation       - likely volume related, increase IVF and F/U LLQ MM spasm       - try robaxin IV VTE       - therapeutic Lovenox for L DVT PT/OT  LOS: 23 days    Mozes Sagar E 03/01/2017

## 2017-03-01 NOTE — Care Management (Signed)
Case manager will continue to follow for discharge plan needs.

## 2017-03-01 NOTE — Progress Notes (Addendum)
PHARMACY - ADULT TOTAL PARENTERAL NUTRITION CONSULT NOTE   Pharmacy Consult:  TPN Indication: Ileus  Patient Measurements: Height: 5\' 4"  (162.6 cm) Weight: 223 lb 9.6 oz (101.4 kg) IBW/kg (Calculated) : 54.7 TPN AdjBW (KG): 63.5 Body mass index is 38.38 kg/m.  Assessment:  21 YOF with severe malnutrition related to renal cell and pancreatic cancer who presented with abdominal mass on 01/12/17.  She is s/p Whipple procedure on the same day for pancreatic cancer.  She was discharged 02/05/17 and readmitted same day for vomiting at SNF. Pharmacy consulted to manage TPN for ileus that prevented oral intake and caused significant nausea and vomiting.   GI: prolonged ileus. Prealbumin improved to 15.6, drain O/P 249mL, LBM 7/6, emesis x1.  Erythromycin for gastroparesis, PPI IV, PRN Zofran/Phenergan (QTc 417ms on 6/17). S/p GJ 7/10, malpositioned, to be replaced with surgical FT *6/27 EGD - no acute findings, a lot of bilious fluid.  *Repeat GES - no obstruction, no motility of stomach  Endo: hypothyroid on Synthroid.  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  TPN provides 1843 kCal and 96gm of protein per day, meeting 100% of needs. - Daily multivitamin in TPN - Trace elements in TPN every other day, next 7/17 - Continue resistant SSI Q4H + increase insulin in TPN back to 65 units - NS with 26mEq KCl at 75 ml/hr per MD.  Remove KCL from IVF given AKI. - Mag sulfate 2gm IV x 1 - F/U AM labs.  May need to remove electrolytes from TPN if renal function continues to worsen. - Spoke to Dr. Grandville Silos, increase Lovenox to 100mg  SQ Q12H d/t increased weight.  Reduce to Q24H when CrCL < 30 ml/min.  Hold ARB during AKI. - F/U with placement of surgical FT to initiate tube feed   Saren Corkern D. Mina Marble, PharmD, BCPS Pager:  [phone] 03/01/2017, 7:53 AM

## 2017-03-02 ENCOUNTER — Inpatient Hospital Stay (HOSPITAL_COMMUNITY): Payer: Medicare Other

## 2017-03-02 LAB — CBC
HCT: 18.7 % — ABNORMAL LOW (ref 36.0–46.0)
HCT: 17.5 % — ABNORMAL LOW (ref 36.0–46.0)
HCT: 23.2 % — ABNORMAL LOW (ref 36.0–46.0)
Hemoglobin: 6.1 g/dL — CL (ref 12.0–15.0)
Hemoglobin: 5.9 g/dL — CL (ref 12.0–15.0)
Hemoglobin: 7.9 g/dL — ABNORMAL LOW (ref 12.0–15.0)
MCH: 31.8 pg (ref 26.0–34.0)
MCH: 31.2 pg (ref 26.0–34.0)
MCH: 31.7 pg (ref 26.0–34.0)
MCHC: 32.6 g/dL (ref 30.0–36.0)
MCHC: 33.7 g/dL (ref 30.0–36.0)
MCHC: 34.1 g/dL (ref 30.0–36.0)
MCV: 97.4 fL (ref 78.0–100.0)
MCV: 92.6 fL (ref 78.0–100.0)
MCV: 93.2 fL (ref 78.0–100.0)
Platelets: 260 10*3/uL (ref 150–400)
Platelets: 251 10*3/uL (ref 150–400)
Platelets: 201 10*3/uL (ref 150–400)
RBC: 1.92 MIL/uL — ABNORMAL LOW (ref 3.87–5.11)
RBC: 1.89 MIL/uL — ABNORMAL LOW (ref 3.87–5.11)
RBC: 2.49 MIL/uL — ABNORMAL LOW (ref 3.87–5.11)
RDW: 16.1 % — ABNORMAL HIGH (ref 11.5–15.5)
RDW: 15.4 % (ref 11.5–15.5)
RDW: 15.6 % — ABNORMAL HIGH (ref 11.5–15.5)
WBC: 23.5 10*3/uL — ABNORMAL HIGH (ref 4.0–10.5)
WBC: 25.6 10*3/uL — ABNORMAL HIGH (ref 4.0–10.5)
WBC: 22.3 10*3/uL — ABNORMAL HIGH (ref 4.0–10.5)

## 2017-03-02 LAB — BASIC METABOLIC PANEL
Anion gap: 7 (ref 5–15)
BUN: 63 mg/dL — ABNORMAL HIGH (ref 6–20)
CO2: 20 mmol/L — ABNORMAL LOW (ref 22–32)
Calcium: 8 mg/dL — ABNORMAL LOW (ref 8.9–10.3)
Chloride: 105 mmol/L (ref 101–111)
Creatinine, Ser: 2.52 mg/dL — ABNORMAL HIGH (ref 0.44–1.00)
GFR calc Af Amer: 20 mL/min — ABNORMAL LOW (ref >60)
GFR calc non Af Amer: 17 mL/min — ABNORMAL LOW (ref >60)
Glucose, Bld: 151 mg/dL — ABNORMAL HIGH (ref 65–99)
Potassium: 4.5 mmol/L (ref 3.5–5.1)
Sodium: 132 mmol/L — ABNORMAL LOW (ref 135–145)

## 2017-03-02 LAB — GLUCOSE, CAPILLARY
Glucose-Capillary: 155 mg/dL — ABNORMAL HIGH (ref 65–99)
Glucose-Capillary: 178 mg/dL — ABNORMAL HIGH (ref 65–99)
Glucose-Capillary: 147 mg/dL — ABNORMAL HIGH (ref 65–99)
Glucose-Capillary: 162 mg/dL — ABNORMAL HIGH (ref 65–99)
Glucose-Capillary: 134 mg/dL — ABNORMAL HIGH (ref 65–99)

## 2017-03-02 LAB — MAGNESIUM: Magnesium: 2.5 mg/dL — ABNORMAL HIGH (ref 1.7–2.4)

## 2017-03-02 LAB — PHOSPHORUS: Phosphorus: 5.3 mg/dL — ABNORMAL HIGH (ref 2.5–4.6)

## 2017-03-02 LAB — PREPARE RBC (CROSSMATCH)

## 2017-03-02 MED ORDER — SODIUM CHLORIDE 0.9 % IV SOLN
Freq: Once | INTRAVENOUS | Status: AC
  Administered 2017-03-02: 13:00:00 via INTRAVENOUS

## 2017-03-02 MED ORDER — FAT EMULSION 20 % IV EMUL
250.0000 mL | INTRAVENOUS | Status: AC
  Administered 2017-03-02: 250 mL via INTRAVENOUS
  Filled 2017-03-02: qty 250

## 2017-03-02 MED ORDER — TRACE MINERALS CR-CU-MN-SE-ZN 10-1000-500-60 MCG/ML IV SOLN
INTRAVENOUS | Status: AC
  Administered 2017-03-02: 18:00:00 via INTRAVENOUS
  Filled 2017-03-02: qty 1920

## 2017-03-02 NOTE — Progress Notes (Signed)
20 Days Post-Op   Subjective/Chief Complaint: Abdominal wall pain, no BM   Objective: Vital signs in last 24 hours: Temp:  [98.2 F (36.8 C)-99 F (37.2 C)] 99 F (37.2 C) (07/17 0525) Pulse Rate:  [87-107] 87 (07/17 0525) BP: (124-137)/(52-65) 125/54 (07/17 0525) SpO2:  [97 %-98 %] 98 % (07/17 0525) Last BM Date:  (pt states small bm on 02/27/17)  Intake/Output from previous day: 07/16 0701 - 07/17 0700 In: 2563.3 [I.V.:2543.3] Out: 570 [Urine:350; Emesis/NG output:200; Drains:20] Intake/Output this shift: No intake/output data recorded.  General appearance: cooperative Resp: clear to auscultation bilaterally Cardio: regular rate and rhythm GI: soft, tender fullness L abd wall muscle, other abd soft, g tube  Lab Results:   Recent Labs  03/02/17 0416 03/02/17 0554  WBC 23.5* 25.6*  HGB 6.1* 5.9*  HCT 18.7* 17.5*  PLT 260 251   BMET  Recent Labs  03/01/17 0509  NA 134*  K 4.9  CL 104  CO2 23  GLUCOSE 231*  BUN 29*  CREATININE 1.47*  CALCIUM 8.1*   PT/INR No results for input(s): LABPROT, INR in the last 72 hours. ABG No results for input(s): PHART, HCO3 in the last 72 hours.  Invalid input(s): PCO2, PO2  Studies/Results: No results found.  Anti-infectives: Anti-infectives    Start     Dose/Rate Route Frequency Ordered Stop   02/16/17 1500  ceFAZolin (ANCEF) IVPB 2g/100 mL premix     2 g 200 mL/hr over 30 Minutes Intravenous To Radiology 02/16/17 1447 02/16/17 1613   02/09/17 0600  erythromycin (EES) 400 MG/5ML suspension 200 mg     200 mg Oral Every 6 hours 02/09/17 0147     02/07/17 0000  erythromycin (EES) 400 MG/5ML suspension 400 mg  Status:  Discontinued     400 mg Oral Every 6 hours 02/07/17 1932 02/09/17 0147   02/06/17 0600  erythromycin ethylsuccinate (EES) 200 MG/5ML suspension 400 mg  Status:  Discontinued     400 mg Oral Every 6 hours 02/06/17 0324 02/07/17 1932      Assessment/Plan: Gastroparesis post-Whipple      - no  change      - IR does not feel that G J-tube will stay positioned and small intestine and communicated  this to patient who does not want the tube repositioned again. -SIPS and chips -continue TPN for prolonged ileus/protein calorie malnutrition - Con't Gtube port to gravity      - May need surgical jejunostomy for TF - will defer to Dr. Marlowe Aschoff decision on this CRT elevation       - labs P ABL anemia       - I think she has developed an abd wall hematoma from anticoagulation. Stop Lovenox. CT  A/P without contrast. TF 2u PRBC Leukocytosis       - CXR, urine CX VTE       - have to hold Lovenox for L DVT PT/OT  LOS: 24 days    Zorian Gunderman E 03/02/2017

## 2017-03-02 NOTE — Progress Notes (Signed)
CRITICAL VALUE ALERT  Critical Value:  Hgb-6.1  Date & Time Notied:  03/02/17 0535  Provider Notified:Dr. Georgette Dover  Orders Received/Actions taken: repeat CBC

## 2017-03-02 NOTE — Progress Notes (Signed)
PHARMACY - ADULT TOTAL PARENTERAL NUTRITION CONSULT NOTE   Pharmacy Consult:  TPN Indication: Ileus  Patient Measurements: Height: 5\' 4"  (162.6 cm) Weight: 223 lb 9.6 oz (101.4 kg) IBW/kg (Calculated) : 54.7 TPN AdjBW (KG): 63.5 Body mass index is 38.38 kg/m.  Assessment:  87 YOF with severe malnutrition related to renal cell and pancreatic cancer who presented with abdominal mass on 01/12/17.  She is s/p Whipple procedure on the same day for pancreatic cancer.  She was discharged 02/05/17 and readmitted same day for vomiting at SNF. Pharmacy consulted to manage TPN for ileus that prevented oral intake and caused significant nausea and vomiting.   GI: prolonged ileus. Prealbumin improved to 15.6, drain O/P 237mL, LBM 7/6, emesis x1.  Erythromycin for gastroparesis, PPI IV, PRN Zofran/Phenergan (QTc 422ms on 6/17). S/p GJ 7/10, malpositioned, to be replaced with surgical FT *6/27 EGD - no acute findings, a lot of bilious fluid.  *Repeat GES - no obstruction, no motility of stomach  Endo: hypothyroid on Synthroid.  DM not on med PTA - CBGs 153-178 Insulin requirements in the past 24 hours:  23 units SSI + 65 units in TPN Lytes:  K 4.5 stable (K removed from IVF 7/16), Mg 2.5- s/p Mg 3g in last 48hr, Phos 5.3.   No lytes: 7/17 Renal: AKI - SCr 0.72 > 1.47 > 2.52, BUN up to 63 - UOP 0.1 ml/kg/hr, NS at 75 ml/hr Pulm: stable on RA Cards: HTN - BP soft-wnl, HR wnl-tachy - Norvasc, Toprol, PRN nifedipine XL  AC: Lovenox for (+)VTE stopped 7/17 d/t drop in Hg, felt to be abd wall hematoma.  CT (+) large rectus hematoma. 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  TPN provides 1843 kCal and 96gm of protein per day, meeting 100% of needs. - Daily multivitamin in TPN - Trace elements in TPN every other day, next 7/17 - Continue resistant SSI Q4H  -  Increase insulin in TPN  to 70 units -  Discuss with DrThompson 7/17- total fluids 130ml/hr.  Decrease NS to 17ml/hr. - Lovenox on hold d/t drop in Hg- f/u - F/U with placement of surgical FT to initiate tube feed - Pt is DBIV, enter consult & alert to labs ordered   Lewie Chamber., PharmD [facility]

## 2017-03-02 NOTE — Clinical Social Work Note (Signed)
CSW met with pt and son-Brian at bedside today. Pt still requesting placement at [facility]. CSW explained to pt x3 that [facility] not an options as they cannot manage TPN at this time. CSW explained that if at time of discharge pt no longer needs TPN, other SNF options may be available. CSW reminded pt of TPN SNF options which are [facility] and [facility]. Pt already refused [facility] and will let CSW know if she is agreeable to [facility] once she speaks with MD again. CSW will continue to follow for discharge needs.   Jeneya McLean, LCSWA, LCASA Clinical Social Work  [phone] 

## 2017-03-02 NOTE — Progress Notes (Signed)
Patient ID: Kaylee Mullins, female   DOB: 1936-06-01, 81 y.o.   MRN: [id number] CT showed large L rectus hematoma. Lovenox stopped. May need IVC filter. I D/W her. Re draw chemistry now, spurious result from PICC.  Georganna Skeans, MD, MPH, FACS Trauma: [phone] General Surgery: [phone]

## 2017-03-02 NOTE — Progress Notes (Signed)
PT Cancellation Note  Patient Details Name: Kaylee Mullins MRN: [id number] DOB: [birth year]   Cancelled Treatment:    Reason Eval/Treat Not Completed: Medical issues which prohibited therapy   Low Hgb, with plans to transfuse;   Will follow up later today as time allows;  Otherwise, will follow up for PT tomorrow;   Thank you,  Roney Marion, PT  Acute Rehabilitation Services Pager [phone] Office [phone]     Colletta Maryland 03/02/2017, 11:37 AM

## 2017-03-02 NOTE — Progress Notes (Signed)
OT Cancellation    03/02/17 0900  OT Visit Information  Last OT Received On 03/02/17  Reason Eval/Treat Not Completed Medical issues which prohibited therapy (Pt just returning from CT. Low hgb at 5.9. Discussed with RN and will wait for pt to receive blood later today. Will return as schedule allows for OT evaluation. Thank you.)   [place], OTR/L Acute Rehab Pager: [phone] Office: [phone]

## 2017-03-03 ENCOUNTER — Encounter (HOSPITAL_COMMUNITY): Payer: Self-pay | Admitting: General Surgery

## 2017-03-03 ENCOUNTER — Inpatient Hospital Stay (HOSPITAL_COMMUNITY): Payer: Medicare Other

## 2017-03-03 HISTORY — PX: IR IVC FILTER PLMT / S&I /IMG GUID/MOD SED: IMG701

## 2017-03-03 LAB — GLUCOSE, CAPILLARY
Glucose-Capillary: 122 mg/dL — ABNORMAL HIGH (ref 65–99)
Glucose-Capillary: 141 mg/dL — ABNORMAL HIGH (ref 65–99)
Glucose-Capillary: 145 mg/dL — ABNORMAL HIGH (ref 65–99)
Glucose-Capillary: 157 mg/dL — ABNORMAL HIGH (ref 65–99)
Glucose-Capillary: 193 mg/dL — ABNORMAL HIGH (ref 65–99)
Glucose-Capillary: 199 mg/dL — ABNORMAL HIGH (ref 65–99)
Glucose-Capillary: 211 mg/dL — ABNORMAL HIGH (ref 65–99)

## 2017-03-03 LAB — CBC
HCT: 23.6 % — ABNORMAL LOW (ref 36.0–46.0)
Hemoglobin: 8 g/dL — ABNORMAL LOW (ref 12.0–15.0)
MCH: 30.7 pg (ref 26.0–34.0)
MCHC: 33.9 g/dL (ref 30.0–36.0)
MCV: 90.4 fL (ref 78.0–100.0)
Platelets: 207 10*3/uL (ref 150–400)
RBC: 2.61 MIL/uL — ABNORMAL LOW (ref 3.87–5.11)
RDW: 15.7 % — ABNORMAL HIGH (ref 11.5–15.5)
WBC: 24.8 10*3/uL — ABNORMAL HIGH (ref 4.0–10.5)

## 2017-03-03 LAB — BASIC METABOLIC PANEL
Anion gap: 7 (ref 5–15)
BUN: 57 mg/dL — ABNORMAL HIGH (ref 6–20)
CO2: 19 mmol/L — ABNORMAL LOW (ref 22–32)
Calcium: 7.8 mg/dL — ABNORMAL LOW (ref 8.9–10.3)
Chloride: 105 mmol/L (ref 101–111)
Creatinine, Ser: 1.73 mg/dL — ABNORMAL HIGH (ref 0.44–1.00)
GFR calc Af Amer: 31 mL/min — ABNORMAL LOW (ref >60)
GFR calc non Af Amer: 27 mL/min — ABNORMAL LOW (ref >60)
Glucose, Bld: 214 mg/dL — ABNORMAL HIGH (ref 65–99)
Potassium: 4 mmol/L (ref 3.5–5.1)
Sodium: 131 mmol/L — ABNORMAL LOW (ref 135–145)

## 2017-03-03 LAB — PROTIME-INR
INR: 1.33
Prothrombin Time: 16.6 seconds — ABNORMAL HIGH (ref 11.4–15.2)

## 2017-03-03 LAB — URINE CULTURE: Culture: <10000 — AB

## 2017-03-03 LAB — MAGNESIUM: Magnesium: 2.5 mg/dL — ABNORMAL HIGH (ref 1.7–2.4)

## 2017-03-03 LAB — PHOSPHORUS: Phosphorus: 3.3 mg/dL (ref 2.5–4.6)

## 2017-03-03 MED ORDER — FENTANYL CITRATE (PF) 100 MCG/2ML IJ SOLN
INTRAMUSCULAR | Status: AC | PRN
  Administered 2017-03-03: 50 ug via INTRAVENOUS

## 2017-03-03 MED ORDER — MIDAZOLAM HCL 2 MG/2ML IJ SOLN
INTRAMUSCULAR | Status: AC | PRN
  Administered 2017-03-03: 1 mg via INTRAVENOUS

## 2017-03-03 MED ORDER — LIDOCAINE HCL (PF) 1 % IJ SOLN
INTRAMUSCULAR | Status: AC
  Filled 2017-03-03: qty 30

## 2017-03-03 MED ORDER — FENTANYL CITRATE (PF) 100 MCG/2ML IJ SOLN
INTRAMUSCULAR | Status: AC
  Filled 2017-03-03: qty 2

## 2017-03-03 MED ORDER — FAT EMULSION 20 % IV EMUL
250.0000 mL | INTRAVENOUS | Status: AC
  Administered 2017-03-03: 250 mL via INTRAVENOUS
  Filled 2017-03-03: qty 250

## 2017-03-03 MED ORDER — LIDOCAINE HCL (PF) 1 % IJ SOLN
INTRAMUSCULAR | Status: AC | PRN
  Administered 2017-03-03: 5 mL

## 2017-03-03 MED ORDER — M.V.I. ADULT IV INJ
INTRAVENOUS | Status: AC
  Administered 2017-03-03: 17:00:00 via INTRAVENOUS
  Filled 2017-03-03: qty 1920

## 2017-03-03 MED ORDER — IOPAMIDOL (ISOVUE-300) INJECTION 61%
INTRAVENOUS | Status: AC
  Filled 2017-03-03: qty 100

## 2017-03-03 MED ORDER — MIDAZOLAM HCL 2 MG/2ML IJ SOLN
INTRAMUSCULAR | Status: AC
  Filled 2017-03-03: qty 2

## 2017-03-03 NOTE — Sedation Documentation (Signed)
EPIC error cardiac monitor NSR

## 2017-03-03 NOTE — Sedation Documentation (Signed)
Patient is resting comfortably. 

## 2017-03-03 NOTE — Progress Notes (Signed)
Patient returned from IR

## 2017-03-03 NOTE — Progress Notes (Signed)
Patient ID: Kaylee Mullins, female   DOB: 04-15-1936, 81 y.o.   MRN: [id number]    Referring Physician(s): Dr. Georganna Skeans  Supervising Physician: Daryll Brod  Patient Status: [facility] - In-pt  Chief Complaint: LE DVT with abdominal intramuscular hematoma  Subjective: Patient is well-known to IR for [place] tube issues and placement.  She has a LLE DVT and has been on Lovenox.  However, she has been having some abdominal pain and a CT scan yesterday reported a large intramuscular abdominal hematoma.  Her Lovenox has been held and we have been asked to see her for IVC filter placement.  Allergies: Hydralazine hcl; Darvon [propoxyphene hcl]; Nyquil multi-symptom [pseudoeph-doxylamine-dm-apap]; and Metformin and related  Medications: Prior to Admission medications   Medication Sig Start Date End Date Taking? Authorizing Provider  acetaminophen (TYLENOL) 500 MG tablet Take 500-1,000 mg by mouth every 6 (six) hours as needed for mild pain or moderate pain (depends on pain if takes 1-2 tablets).   Yes [provider]  albuterol (PROVENTIL) (2.5 MG/3ML) 0.083% nebulizer solution Take 3 mLs (2.5 mg total) by nebulization every 4 (four) hours as needed for wheezing or shortness of breath. 02/04/17  Yes Stark Klein, MD  amLODipine (NORVASC) 10 MG tablet Take 1 tablet (10 mg total) by mouth daily. 02/04/17  Yes Stark Klein, MD  aspirin (ASPIRIN CHILDRENS) 81 MG chewable tablet Chew 1 tablet (81 mg total) by mouth daily. 02/04/17 02/04/18 Yes Stark Klein, MD  enoxaparin (LOVENOX) 100 MG/ML injection Inject 0.9 mLs (90 mg total) into the skin every 12 (twelve) hours. 02/04/17  Yes Stark Klein, MD  erythromycin ethylsuccinate (EES) 200 MG/5ML suspension Take 10 mLs (400 mg total) by mouth every 6 (six) hours. 02/04/17  Yes Stark Klein, MD  furosemide (LASIX) 40 MG tablet Take 40 mg by mouth daily as needed (leg swelling).   Yes [provider]  insulin aspart (NOVOLOG) 100 UNIT/ML  injection Inject 0-15 Units into the skin every 4 (four) hours. 02/04/17  Yes Stark Klein, MD  irbesartan (AVAPRO) 300 MG tablet Take 1 tablet (300 mg total) by mouth daily. 02/04/17  Yes Stark Klein, MD  levothyroxine (SYNTHROID, LEVOTHROID) 200 MCG tablet Take 200 mcg by mouth daily before breakfast. For hypothyroidism 11/11/15  Yes [provider]  metoprolol succinate (TOPROL-XL) 100 MG 24 hr tablet Take 100 mg by mouth daily. Take with or immediately following a meal.    Yes [provider]  NIFEdipine (PROCARDIA-XL/ADALAT CC) 30 MG 24 hr tablet Take 1 tablet (30 mg total) by mouth 2 (two) times daily as needed (systolic BP over 696 mmHg). 02/04/17  Yes Stark Klein, MD  oxyCODONE (ROXICODONE) 5 MG/5ML solution Take 5-10 mLs (5-10 mg total) by mouth every 4 (four) hours as needed for moderate pain or severe pain. 02/02/17  Yes Stark Klein, MD  pantoprazole (PROTONIX) 40 MG tablet Take 1 tablet (40 mg total) by mouth at bedtime. 02/04/17  Yes Stark Klein, MD  potassium chloride SA (K-DUR,KLOR-CON) 20 MEQ tablet Take 20 mEq by mouth 2 (two) times daily.   Yes [provider]  rosuvastatin (CRESTOR) 20 MG tablet Take 20 mg by mouth daily.  05/02/16  Yes [provider]  telmisartan-hydrochlorothiazide (MICARDIS HCT) 80-25 MG tablet Take 1 tablet by mouth daily.  05/01/16  Yes [provider]  nystatin (MYCOSTATIN) 100000 UNIT/ML suspension Take 5 mLs (500,000 Units total) by mouth 4 (four) times daily. 02/26/17   Stark Klein, MD  ondansetron (ZOFRAN-ODT) 4 MG disintegrating  tablet Take 1 tablet (4 mg total) by mouth every 6 (six) hours as needed for nausea. 02/26/17   Stark Klein, MD  oxyCODONE-acetaminophen (PERCOCET/ROXICET) 5-325 MG tablet Take 0.5-1 tablets by mouth every 4 (four) hours as needed for moderate pain. Depends on pain if takes 0.5-1 tablet 02/26/17   Stark Klein, MD  Potassium Chloride in NaCl (0.9 % NACL WITH KCL 20 MEQ / L) 20-0.9  MEQ/L-% 50 ml/hr 02/26/17   Stark Klein, MD  sodium chloride flush (NS) 0.9 % SOLN 10-40 mLs by Intracatheter route as needed (flush). 02/26/17   Stark Klein, MD    Vital Signs: BP (!) 139/58 (BP Location: Left Arm)   Pulse 77   Temp 99 F (37.2 C) (Oral)   Resp 20   Ht 5\' 4"  (1.626 m)   Wt 223 lb 9.6 oz (101.4 kg)   SpO2 98%   BMI 38.38 kg/m   Physical Exam: Gen: NAD Heart: regular Lungs: CTAB Abd: soft, palpable hematoma on left side which is tender  Imaging: Ct Abdomen Pelvis Wo Contrast  Addendum Date: 03/02/2017   ADDENDUM REPORT: 03/02/2017 09:38 ADDENDUM: Study discussed by telephone with Dr. Georganna Skeans on 03/02/2017 at 09:38 . Electronically Signed   By: Genevie Ann M.D.   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 Prior right renal cell carcinoma. Cryoablation Status post gastrostomy tube. Possible abdominal wall hematoma. Lower abdominal pain. EXAM: CT ABDOMEN AND PELVIS WITHOUT CONTRAST TECHNIQUE: Multidetector CT imaging of the abdomen and pelvis was performed following the standard protocol without IV contrast. COMPARISON:  KUB 02/24/2017. Postoperative CT Abdomen and Pelvis 02/06/2017. FINDINGS: Lower chest: Stable cardiomegaly. Calcified coronary artery atherosclerosis. No pericardial effusion. No pleural effusion. Continued left lower lobe peribronchial opacity, probably atelectasis. Hepatobiliary: On this noncontrast study there is new heterogeneity and indistinct hypodensity seen throughout much of the liver parenchyma. This is most apparent at the junction of the left and right hepatic lobes on series 3, image 28 encompassing about 6 cm. Surgically absent gallbladder. Pancreas: Status post Whipple. There is a stable main pancreatic duct stent which terminates at the pancreatico-jejunal anastomosis. Spleen: Negative. Adrenals/Urinary Tract: Normal adrenal  glands. Stable noncontrast kidneys. Stomach/Bowel: Sequelae of Whipple procedure again noted. Gastrostomy tube now in place, and is along the superior margin of the rectus muscle hematoma described below. Retained barium contrast in some small distal bowel loops, and throughout the colon from the cecum to the splenic flexure. Retained barium in descending and sigmoid diverticula. No dilated bowel. Vascular/Lymphatic: Calcified aortic atherosclerosis. Vascular patency is not evaluated in the absence of IV contrast. Reproductive: Surgically absent uterus. Stable an negative adnexa with dystrophic calcifications. Other: Mild presacral stranding. No pelvic free fluid. Ventral midline abdominal incision is stable with no adverse features. Musculoskeletal: Stable heterogeneous bone mineralization throughout the visible spine. Previous lower lumbar interbody cage implants. Stable visualized osseous structures. New large left rectus intramuscular hematoma with heterogeneous density encompassing up to 8.4 x 8.7 x 23.2 cm (estimated volume 848 mL). Mild enlargement of the adjacent oblique left abdominal musculature. Mild soft tissue inflammatory stranding surrounding the hematoma there is hematoma extension into the space of Retzius with mass effect on the urinary bladder, more so on the there is left. There is also some extension along the left pelvic side wall along the obturator musculature. IMPRESSION: 1. Large intramuscular hematoma extending from the rectus muscle into the left space of  Retzius, and along the left pelvic sidewall. Estimated hematoma is 8 x 9 x 23 cm, with volume as large is 848 mL. Regional mass effect including on the left aspect of the urinary bladder. 2. Suspicious new heterogeneity of the noncontrast liver parenchyma, raising the possibility of new hepatic metastatic disease. 3. Gastrostomy tube in place.  No bowel obstruction. 4. Stable pancreatic duct stent. Electronically Signed: By: Genevie Ann M.D.  On: 03/02/2017 09:34    Labs:  CBC:  Recent Labs  03/02/17 0416 03/02/17 0554 03/02/17 2227 03/03/17 0534  WBC 23.5* 25.6* 22.3* 24.8*  HGB 6.1* 5.9* 7.9* 8.0*  HCT 18.7* 17.5* 23.2* 23.6*  PLT 260 251 201 207    COAGS:  Recent Labs  01/13/17 0423 01/18/17 1707 02/16/17 0456 03/03/17 0852  INR 1.55 1.31 1.24 1.33  APTT  --   --  42*  --     BMP:  Recent Labs  02/26/17 0522 03/01/17 0509 03/02/17 1030 03/03/17 0534  NA 135 134* 132* 131*  K 4.2 4.9 4.5 4.0  CL 104 104 105 105  CO2 27 23 20* 19*  GLUCOSE 138* 231* 151* 214*  BUN 16 29* 63* 57*  CALCIUM 8.2* 8.1* 8.0* 7.8*  CREATININE 0.72 1.47* 2.52* 1.73*  GFRNONAA >60 33* 17* 27*  GFRAA >60 38* 20* 31*    LIVER FUNCTION TESTS:  Recent Labs  02/18/17 0410 02/22/17 0506 02/25/17 0146 03/01/17 0509  BILITOT 0.4 0.4 0.4 0.4  AST 49* 40 40 30  ALT 42 40 38 28  ALKPHOS 71 82 90 76  PROT 6.2* 6.7 6.5 5.9*  ALBUMIN 2.8* 2.7* 2.7* 2.6*    Assessment and Plan: 1. LLE DVT with large abdominal intramuscular hematoma and anemia  We will plan to place an IVC filter today if schedule allows, if not then tomorrow.  She is NPO.  Her labs have been reviewed. Risks and Benefits discussed with the patient including, but not limited to bleeding, infection, contrast induced renal failure, filter fracture or migration which can lead to emergency surgery or even death, strut penetration with damage or irritation to adjacent structures and caval thrombosis. All of the patient's questions were answered, patient is agreeable to proceed. Consent signed and in chart.  Electronically Signed: Henreitta Cea 03/03/2017, 9:39 AM   I spent a total of 25 Minutes at the the patient's bedside AND on the patient's hospital floor or unit, greater than 50% of which was counseling/coordinating care for LLE DVT with abdominal hematoma

## 2017-03-03 NOTE — Sedation Documentation (Signed)
EPIC error cardiac NSR

## 2017-03-03 NOTE — Procedures (Signed)
DVT, acute rectus sheath hematoma  S/p IVC FILTER INSERTION  NO COMP STABLE EBL 0 FULL REPORT IN PACS

## 2017-03-03 NOTE — Progress Notes (Signed)
Patient taken to IR for IVC filter.

## 2017-03-03 NOTE — Evaluation (Signed)
Physical Therapy Evaluation Patient Details Name: Kaylee Mullins MRN: [id number] DOB: Mar 26, 1936 Today's Date: 03/03/2017   History of Present Illness  Pt is a 81 y/o female who was dishcharged and readmitted following new onset nausea at SNF. Pt is s/p whipple placement from previous admission, and had GJ tube placed this admission. Pt with LLE DVT and L abdomen intramuscular hematoma; lovenox discontinued and supposed to have IV filter placed today or tomorrow. PMH includes anxiety, thyroid cancer, pancreatic mass s/p whipple, DM, HTN, CVA with L sided weakness, and s/p lumbar surgery.   Clinical Impression  Pt admitted secondary to problem above with deficits below. Pt was previously discharged to SNF, however, readmitted same day. Reports she used to use cane, however, had been using RW currently. Upon eval, pt with limited tolerance secondary to fatigue, decreased strength, balance, and abdominal pain. Required min (occasional min +2) to min guard for safety, however, very limited ambulation tolerance. Would benefit from return to SNF at d/c to increase independence with functional mobility. Will continue to follow acutely to maximize functional mobility independence.     Follow Up Recommendations SNF    Equipment Recommendations  3in1 (PT)    Recommendations for Other Services       Precautions / Restrictions Precautions Precautions: Fall Precaution Comments: ; GJ tube  Required Braces or Orthoses: Other Brace/Splint Other Brace/Splint: abdominal binder.  Restrictions Weight Bearing Restrictions: No      Mobility  Bed Mobility Overal bed mobility: Needs Assistance Bed Mobility: Supine to Sit     Supine to sit: Min assist;+2 for physical assistance Sit to supine: Min assist;+2 for physical assistance;HOB elevated   General bed mobility comments: Min A to bring BLE to EOB and bring hips over with pad.  Transfers Overall transfer level: Needs assistance Equipment used:  Rolling walker (2 wheeled);None Transfers: Sit to/from [employer] Sit to Stand: Min assist   Squat pivot transfers: Min guard     General transfer comment: Pt performed Squat pivot from EOB to BSC. sit<>stand required Min A to power up into standing and maintain balance due to post lean  Ambulation/Gait Ambulation/Gait assistance: Min guard;Min assist Ambulation Distance (Feet): 5 Feet Assistive device: Rolling walker (2 wheeled);None Gait Pattern/deviations: Step-through pattern;Decreased stride length;Trunk flexed Gait velocity: Decreased Gait velocity interpretation: Below normal speed for age/gender General Gait Details: Decreased tolerance secondary to fatigue. Slow, unsteady gait. Verbal cues for upright posture and proximity to device.   Stairs            Wheelchair Mobility    Modified Rankin (Stroke Patients Only)       Balance Overall balance assessment: Needs assistance Sitting-balance support: Feet supported;No upper extremity supported Sitting balance-Leahy Scale: Good Sitting balance - Comments: Able to lean forward to adjust socks   Standing balance support: No upper extremity supported;During functional activity Standing balance-Leahy Scale: Fair Standing balance comment: Able to perform pericare with single LOB due to post lean and requires Min A to correct                             Pertinent Vitals/Pain Pain Assessment: 0-10 Pain Score: 9  Faces Pain Scale: Hurts even more Pain Location: abdomen Pain Descriptors / Indicators: Sore Pain Intervention(s): Limited activity within patient's tolerance;Monitored during session;Repositioned    Home Living Family/patient expects to be discharged to:: Skilled nursing facility Living Arrangements: Alone  Prior Function Level of Independence: Independent         Comments: Pt reports she is very active - drives, active in community      Hand  Dominance   Dominant Hand: Right    Extremity/Trunk Assessment   Upper Extremity Assessment Upper Extremity Assessment: Generalized weakness    Lower Extremity Assessment Lower Extremity Assessment: Generalized weakness    Cervical / Trunk Assessment Cervical / Trunk Assessment: Normal;Other exceptions Cervical / Trunk Exceptions: Increased body habitus  Communication   Communication: No difficulties  Cognition Arousal/Alertness: Awake/alert Behavior During Therapy: WFL for tasks assessed/performed Overall Cognitive Status: Within Functional Limits for tasks assessed                                        General Comments General comments (skin integrity, edema, etc.): Pt with limited tolerance secondary to fatigue.     Exercises     Assessment/Plan    PT Assessment Patient needs continued PT services  PT Problem List Decreased strength;Decreased activity tolerance;Decreased mobility;Decreased balance;Decreased knowledge of use of DME;Decreased knowledge of precautions;Pain       PT Treatment Interventions DME instruction;Gait training;Stair training;Functional mobility training;Therapeutic activities;Therapeutic exercise;Neuromuscular re-education;Balance training;Patient/family education    PT Goals (Current goals can be found in the Care Plan section)  Acute Rehab PT Goals Patient Stated Goal: to get better  PT Goal Formulation: With patient Time For Goal Achievement: 03/10/17 Potential to Achieve Goals: Fair    Frequency Min 2X/week   Barriers to discharge        Co-evaluation               AM-PAC PT "6 Clicks" Daily Activity  Outcome Measure Difficulty turning over in bed (including adjusting bedclothes, sheets and blankets)?: A Lot Difficulty moving from lying on back to sitting on the side of the bed? : Total Difficulty sitting down on and standing up from a chair with arms (e.g., wheelchair, bedside commode, etc,.)?: Total Help  needed moving to and from a bed to chair (including a wheelchair)?: A Little Help needed walking in hospital room?: A Little Help needed climbing 3-5 steps with a railing? : A Lot 6 Click Score: 12    End of Session Equipment Utilized During Treatment: Gait belt;Other (comment) (abdominal binder ) Activity Tolerance: Patient limited by fatigue Patient left: in chair;with call bell/phone within reach Nurse Communication: Mobility status PT Visit Diagnosis: Difficulty in walking, not elsewhere classified (R26.2);Muscle weakness (generalized) (M62.81);Pain Pain - part of body:  (abdomen )    Time: 7680-8811 PT Time Calculation (min) (ACUTE ONLY): 24 min   Charges:   PT Evaluation $PT Eval Moderate Complexity: 1 Procedure     PT G Codes:        Leighton Ruff, PT, DPT  Acute Rehabilitation Services  Pager: [phone]   Rudean Hitt 03/03/2017, 2:03 PM

## 2017-03-03 NOTE — Sedation Documentation (Signed)
Patient denies pain and is resting comfortably.  

## 2017-03-03 NOTE — Evaluation (Addendum)
Occupational Therapy Evaluation Patient Details Name: Kaylee Mullins MRN: [id number] DOB: 07-13-1936 Today's Date: 03/03/2017    History of Present Illness Pt is a 81 y/o female who was dishcharged and readmitted following new onset nausea at SNF. Pt is s/p whipple placement from previous admission, and had GJ tube placed this admission. Pt with LLE DVT and L abdomen intramuscular hematoma; lovenox discontinued and supposed to have IV filter placed today or tomorrow. PMH includes anxiety, thyroid cancer, pancreatic mass s/p whipple, DM, HTN, CVA with L sided weakness, and s/p lumbar surgery.    Clinical Impression   PTA, pt was previously at [facility] and readmitted in same day. Prior to last admission, pt was living alone and was independent.  Currently, pt requires Mod A for ADLs and Min A for functional mobility using RW. Pt would benefit from acute OT to facilitate safe dc and optimize her occupational performance. Recommend dc SNF for further OT to increase safety and independence with ADLs and functional mobility.    Follow Up Recommendations  SNF;Supervision/Assistance - 24 hour    Equipment Recommendations   (Defer to next venue)    Recommendations for Other Services PT consult     Precautions / Restrictions Precautions Precautions: Fall Precaution Comments: GJ tube  Required Braces or Orthoses: Other Brace/Splint Other Brace/Splint: abdominal binder.  Restrictions Weight Bearing Restrictions: No      Mobility Bed Mobility Overal bed mobility: Needs Assistance Bed Mobility: Supine to Sit     Supine to sit: Min assist;+2 for physical assistance     General bed mobility comments: Min A to bring BLE to EOB and bring hips over with pad.  Transfers Overall transfer level: Needs assistance Equipment used: Rolling walker (2 wheeled);None Transfers: Sit to/from [employer] to Stand: Min assist   Squat pivot transfers: Min guard     General transfer  comment: Pt performed Squat pivot from EOB to BSC. sit<>stand required Min A to power up into standing and maintain balance due to post lean    Balance Overall balance assessment: Needs assistance Sitting-balance support: Feet supported;No upper extremity supported Sitting balance-Leahy Scale: Good Sitting balance - Comments: Able to lean forward to adjust socks   Standing balance support: No upper extremity supported;During functional activity Standing balance-Leahy Scale: Fair Standing balance comment: Able to perform pericare with single LOB due to post lean and requires Min A to correct                           ADL either performed or assessed with clinical judgement   ADL Overall ADL's : Needs assistance/impaired Eating/Feeding: Set up;Sitting   Grooming: Set up;Sitting   Upper Body Bathing: Moderate assistance;Sitting   Lower Body Bathing: Sit to/from stand;Moderate assistance   Upper Body Dressing : Sitting;Minimal assistance   Lower Body Dressing: Moderate assistance;Sit to/from stand Lower Body Dressing Details (indicate cue type and reason): Able to bend forward to adjust socks Toilet Transfer: Min guard;Squat-pivot;BSC   Toileting- Clothing Manipulation and Hygiene: Min guard;Sit to/from stand Toileting - Clothing Manipulation Details (indicate cue type and reason): Pt performed peri care with Min guard A for safety.      Functional mobility during ADLs: Min guard;Rolling walker General ADL Comments: Pt decreased activity tolerance. Performed toileting and then requested to relax in [place]  Pertinent Vitals/Pain Pain Assessment: 0-10 Pain Score: 9  Faces Pain Scale: Hurts even more Pain Location: abdomen Pain Descriptors / Indicators: Sore Pain Intervention(s): Limited activity within patient's tolerance;Monitored during session;Repositioned     Hand Dominance Right   Extremity/Trunk  Assessment Upper Extremity Assessment Upper Extremity Assessment: Generalized weakness   Lower Extremity Assessment Lower Extremity Assessment: Generalized weakness   Cervical / Trunk Assessment Cervical / Trunk Assessment: Normal;Other exceptions Cervical / Trunk Exceptions: Increased body habitus   Communication Communication Communication: No difficulties   Cognition Arousal/Alertness: Awake/alert Behavior During Therapy: WFL for tasks assessed/performed Overall Cognitive Status: Within Functional Limits for tasks assessed                                     General Comments  Pt with limited tolerance secondary to fatigue.     Exercises     Shoulder Instructions      Home Living Family/patient expects to be discharged to:: Skilled nursing facility Living Arrangements: Alone Available Help at Discharge: Family;Available PRN/intermittently Type of Home: House Home Access: Stairs to enter [employer] of Steps: 1 Entrance Stairs-Rails: None Home Layout: Two level Alternate Level Stairs-Number of Steps: flight --has stair lift   Bathroom Shower/Tub: Occupational psychologist: Standard     Home Equipment: Environmental consultant - 2 wheels   Additional Comments: Pt lived alone PTA       Prior Functioning/Environment Level of Independence: Independent        Comments: Pt reports she is very active - drives, active in community         OT Problem List: Decreased strength;Decreased activity tolerance;Impaired balance (sitting and/or standing);Decreased knowledge of use of DME or AE;Pain      OT Treatment/Interventions: Self-care/ADL training;Therapeutic exercise;DME and/or AE instruction;Therapeutic activities;Patient/family education;Balance training    OT Goals(Current goals can be found in the care plan section) Acute Rehab OT Goals Patient Stated Goal: to get better  OT Goal Formulation: With patient Time For Goal Achievement:  02/04/17 Potential to Achieve Goals: Good ADL Goals Pt Will Perform Grooming: with min guard assist;standing Pt Will Perform Lower Body Dressing: with min guard assist;sit to/from stand Pt Will Transfer to Toilet: with min guard assist;bedside commode;ambulating Pt Will Perform Toileting - Clothing Manipulation and hygiene: with min guard assist;sit to/from stand  OT Frequency: Min 2X/week   Barriers to D/C: Decreased caregiver support          Co-evaluation PT/OT/SLP Co-Evaluation/Treatment: Yes Reason for Co-Treatment: Complexity of the patient's impairments (multi-system involvement) PT goals addressed during session: Mobility/safety with mobility OT goals addressed during session: ADL's and self-care      AM-PAC PT "6 Clicks" Daily Activity     Outcome Measure Help from another person eating meals?: None Help from another person taking care of personal grooming?: A Little Help from another person toileting, which includes using toliet, bedpan, or urinal?: A Little Help from another person bathing (including washing, rinsing, drying)?: A Little Help from another person to put on and taking off regular upper body clothing?: A Little Help from another person to put on and taking off regular lower body clothing?: A Lot 6 Click Score: 18   End of Session Equipment Utilized During Treatment: Rolling walker;Gait belt;Other (comment) (Abdominal binder) Nurse Communication: Mobility status  Activity Tolerance: Patient tolerated treatment well;Patient limited by fatigue Patient left: with call bell/phone within reach;in chair  OT Visit Diagnosis:  Unsteadiness on feet (R26.81);Pain Pain - Right/Left:  (Abdomen) Pain - part of body:  (abdomen)                Time: 3225-6720 OT Time Calculation (min): 24 min Charges:  OT General Charges $OT Visit: 1 Procedure OT Evaluation $OT Eval Low Complexity: 1 Procedure G-Codes:     Keishana Klinger MSOT, OTR/L Acute Rehab Pager:  [phone] Office: [place] 03/03/2017, 5:13 PM

## 2017-03-03 NOTE — Sedation Documentation (Signed)
EPIC error NSR

## 2017-03-03 NOTE — Progress Notes (Signed)
PHARMACY - ADULT TOTAL PARENTERAL NUTRITION CONSULT NOTE   Pharmacy Consult:  TPN Indication: Ileus  Patient Measurements: Height: 5\' 4"  (162.6 cm) Weight: 223 lb 9.6 oz (101.4 kg) IBW/kg (Calculated) : 54.7 TPN AdjBW (KG): 63.5 Body mass index is 38.38 kg/m.  Assessment:  89 YOF with severe malnutrition related to renal cell and pancreatic cancer who presented with abdominal mass on 01/12/17.  She is s/p Whipple procedure on the same day for pancreatic cancer.  She was discharged 02/05/17 and readmitted same day for vomiting at SNF. Pharmacy consulted to manage TPN for ileus that prevented oral intake and caused significant nausea and vomiting.   GI: prolonged ileus. Decreased abd wall pain.  Prealbumin improved to 15.6, drain O/P 8mL, LBM 7/6.  S/p GJ 7/10, malpositioned, to be replaced with surgical FT.  Erythromycin for gastroparesis- refusing, PPI IV, PRN Zofran/Phenergan (QTc 429ms on 6/17). *6/27 EGD - no acute findings, a lot of bilious fluid.  *Repeat GES - no obstruction, no motility of stomach  Endo: hypothyroid on Synthroid.  DM not on med PTA - CBGs 134-211 Insulin requirements in the past 24 hours:  22 units SSI + 70 units in TPN Lytes:  K 4  (K removed from IVF 7/16), Mg 2.5 stable, Phos 3.3.  Will add back lytes today since Cr improved & K/Phos dec, expect Mg to dec too.  May need to alternate with & without lytes. No lytes: 7/17 Renal: AKI - SCr 0.72 > 1.47 > 2.52 > 1.73.  BUN 57 ~stable - UOP 0.8 ml/kg/hr, NS at 75 ml/hr Pulm: stable on RA Cards: HTN - BP soft-wnl, HR wnl-tachy - Norvasc, Toprol, PRN nifedipine XL  AC: Lovenox for (+)VTE stopped 7/17 d/t drop in Hg.  CT (+) large rectus hematoma.  Consulting IR for IVC filter.  Hg 8- up after transfusion, pltc wnl. Hepatobil: LFTs / tbili / TG WNL Neuro: hx CVA 2016 with residual L-hand and foot weakness - PRN Dilaudid/oxy ID: Nystatin for thrush - afebrile, WBC up 24.8  Best Practices: SCDs  TPN Access: triple  lumen PICC placed 6/11 - replaced 02/02/17 TPN start date: 01/23/17   Nutritional Goals (per RD recommendation on 7/3): 1800-1950 kCal and 82-93 g protein per day  Current Nutrition: Sips and ice chips   TPN   Plan:   - Change to Clinimix-E 5/15 at 80 ml/hr and 20% ILE at 20 ml/hr over 12 hrs.  TPN provides 1843 kCal and 96gm of protein per day, meeting 100% of needs. - Daily multivitamin in TPN - Trace elements in TPN every other day, next 7/19 - Continue resistant SSI Q4H  - Increase insulin in TPN  to 80 units - Discuss with DrThompson 7/17- total fluids 121ml/hr.  Decrease NS to 2ml/hr. - Lovenox on hold d/t drop in Hg- f/u - F/U with placement of surgical FT to initiate tube feed - Pt is DBIV, enter consult & alert to labs ordered   Lewie Chamber., PharmD [facility]

## 2017-03-03 NOTE — Progress Notes (Signed)
21 Days Post-Op   Subjective/Chief Complaint: CC still some abd wall pain, slept better   Objective: Vital signs in last 24 hours: Temp:  [99 F (37.2 C)-99.9 F (37.7 C)] 99 F (37.2 C) (07/18 0555) Pulse Rate:  [58-99] 77 (07/18 0555) Resp:  [18-20] 20 (07/17 1945) BP: (129-155)/(54-71) 139/58 (07/18 0555) SpO2:  [98 %-100 %] 98 % (07/18 0555) Last BM Date:  (pt states small bm on 02/27/17)  Intake/Output from previous day: 07/17 0701 - 07/18 0700 In: 1852.6 [I.V.:1457.6; Blood:335; IV Piggyback:60] Out: 1308 [Urine:1650] Intake/Output this shift: No intake/output data recorded.  General appearance: cooperative Resp: clear to auscultation bilaterally Cardio: regular rate and rhythm GI: soft, L rectus hematoma is tender, G tube  Lab Results:   Recent Labs  03/02/17 2227 03/03/17 0534  WBC 22.3* 24.8*  HGB 7.9* 8.0*  HCT 23.2* 23.6*  PLT 201 207   BMET  Recent Labs  03/02/17 1030 03/03/17 0534  NA 132* 131*  K 4.5 4.0  CL 105 105  CO2 20* 19*  GLUCOSE 151* 214*  BUN 63* 57*  CREATININE 2.52* 1.73*  CALCIUM 8.0* 7.8*   PT/INR No results for input(s): LABPROT, INR in the last 72 hours. ABG No results for input(s): PHART, HCO3 in the last 72 hours.  Invalid input(s): PCO2, PO2  Studies/Results: Ct Abdomen Pelvis Wo Contrast  Addendum Date: 03/02/2017   ADDENDUM REPORT: 03/02/2017 09:38 ADDENDUM: Study discussed by telephone with Dr. Georganna Skeans on 03/02/2017 at 09:38 . Electronically Signed   By: Genevie Ann M.D.   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 Cryoablation Status post gastrostomy tube. Possible abdominal wall hematoma. Lower abdominal pain. EXAM: CT ABDOMEN AND PELVIS WITHOUT CONTRAST TECHNIQUE: Multidetector CT imaging of the abdomen and pelvis was performed following the standard protocol  without IV contrast. COMPARISON:  KUB 02/24/2017. Postoperative CT Abdomen and Pelvis 02/06/2017. FINDINGS: Lower chest: Stable cardiomegaly. Calcified coronary artery atherosclerosis. No pericardial effusion. No pleural effusion. Continued left lower lobe peribronchial opacity, probably atelectasis. Hepatobiliary: On this noncontrast study there is new heterogeneity and indistinct hypodensity seen throughout much of the liver parenchyma. This is most apparent at the junction of the left and right hepatic lobes on series 3, image 28 encompassing about 6 cm. Surgically absent gallbladder. Pancreas: Status post Whipple. There is a stable main pancreatic duct stent which terminates at the pancreatico-jejunal anastomosis. Spleen: Negative. Adrenals/Urinary Tract: Normal adrenal glands. Stable noncontrast kidneys. Stomach/Bowel: Sequelae of Whipple procedure again noted. Gastrostomy tube now in place, and is along the superior margin of the rectus muscle hematoma described below. Retained barium contrast in some small distal bowel loops, and throughout the colon from the cecum to the splenic flexure. Retained barium in descending and sigmoid diverticula. No dilated bowel. Vascular/Lymphatic: Calcified aortic atherosclerosis. Vascular patency is not evaluated in the absence of IV contrast. Reproductive: Surgically absent uterus. Stable an negative adnexa with dystrophic calcifications. Other: Mild presacral stranding. No pelvic free fluid. Ventral midline abdominal incision is stable with no adverse features. Musculoskeletal: Stable heterogeneous bone mineralization throughout the visible spine. Previous lower lumbar interbody cage implants. Stable visualized osseous structures. New large left rectus intramuscular hematoma with heterogeneous density encompassing up to 8.4 x 8.7 x 23.2 cm (estimated volume 848 mL). Mild enlargement of the adjacent oblique left abdominal musculature. Mild soft tissue inflammatory stranding  surrounding the hematoma there  is hematoma extension into the space of Retzius with mass effect on the urinary bladder, more so on the there is left. There is also some extension along the left pelvic side wall along the obturator musculature. IMPRESSION: 1. Large intramuscular hematoma extending from the rectus muscle into the left space of Retzius, and along the left pelvic sidewall. Estimated hematoma is 8 x 9 x 23 cm, with volume as large is 848 mL. Regional mass effect including on the left aspect of the urinary bladder. 2. Suspicious new heterogeneity of the noncontrast liver parenchyma, raising the possibility of new hepatic metastatic disease. 3. Gastrostomy tube in place.  No bowel obstruction. 4. Stable pancreatic duct stent. Electronically Signed: By: Genevie Ann M.D. On: 03/02/2017 09:34    Anti-infectives: Anti-infectives    Start     Dose/Rate Route Frequency Ordered Stop   02/16/17 1500  ceFAZolin (ANCEF) IVPB 2g/100 mL premix     2 g 200 mL/hr over 30 Minutes Intravenous To Radiology 02/16/17 1447 02/16/17 1613   02/09/17 0600  erythromycin (EES) 400 MG/5ML suspension 200 mg     200 mg Oral Every 6 hours 02/09/17 0147     02/07/17 0000  erythromycin (EES) 400 MG/5ML suspension 400 mg  Status:  Discontinued     400 mg Oral Every 6 hours 02/07/17 1932 02/09/17 0147   02/06/17 0600  erythromycin ethylsuccinate (EES) 200 MG/5ML suspension 400 mg  Status:  Discontinued     400 mg Oral Every 6 hours 02/06/17 0324 02/07/17 1932      Assessment/Plan: Gastroparesis post-Whipple      - no change      - IR does not feel that G J-tube will stay positioned and small intestine and communicated  this to patient who does not want the tube repositioned again. - SIPS and chips - continue TPN for prolonged ileus/protein calorie malnutrition - Con't Gtube port to gravity      - May need surgical jejunostomy for TF - will defer to Dr. Marlowe Aschoff decision on this CRT elevation       - improving,  good U/O Large L rectus hematoma       - from anticoagulation. Stop Lovenox.       - Hb up S/P 2u PRBC Leukocytosis       - urine CX P       - afeb       - may be due to hematoma LLE DVT       - have to hold Lovenox       - consult IR for IVC filter PT/OT  LOS: 25 days    Lanard Arguijo E 03/03/2017

## 2017-03-04 LAB — COMPREHENSIVE METABOLIC PANEL
ALT: 112 U/L — ABNORMAL HIGH (ref 14–54)
AST: 143 U/L — ABNORMAL HIGH (ref 15–41)
Albumin: 2.2 g/dL — ABNORMAL LOW (ref 3.5–5.0)
Alkaline Phosphatase: 99 U/L (ref 38–126)
Anion gap: 4 — ABNORMAL LOW (ref 5–15)
BUN: 40 mg/dL — ABNORMAL HIGH (ref 6–20)
CO2: 21 mmol/L — ABNORMAL LOW (ref 22–32)
Calcium: 7.8 mg/dL — ABNORMAL LOW (ref 8.9–10.3)
Chloride: 110 mmol/L (ref 101–111)
Creatinine, Ser: 1.19 mg/dL — ABNORMAL HIGH (ref 0.44–1.00)
GFR calc Af Amer: 49 mL/min — ABNORMAL LOW (ref >60)
GFR calc non Af Amer: 43 mL/min — ABNORMAL LOW (ref >60)
Glucose, Bld: 142 mg/dL — ABNORMAL HIGH (ref 65–99)
Potassium: 4.2 mmol/L (ref 3.5–5.1)
Sodium: 135 mmol/L (ref 135–145)
Total Bilirubin: 1.2 mg/dL (ref 0.3–1.2)
Total Protein: 5.6 g/dL — ABNORMAL LOW (ref 6.5–8.1)

## 2017-03-04 LAB — CBC
HCT: 20.4 % — ABNORMAL LOW (ref 36.0–46.0)
Hemoglobin: 6.8 g/dL — CL (ref 12.0–15.0)
MCH: 30.4 pg (ref 26.0–34.0)
MCHC: 33.3 g/dL (ref 30.0–36.0)
MCV: 91.1 fL (ref 78.0–100.0)
Platelets: 184 10*3/uL (ref 150–400)
RBC: 2.24 MIL/uL — ABNORMAL LOW (ref 3.87–5.11)
RDW: 16 % — ABNORMAL HIGH (ref 11.5–15.5)
WBC: 16.6 10*3/uL — ABNORMAL HIGH (ref 4.0–10.5)

## 2017-03-04 LAB — MAGNESIUM: Magnesium: 2.4 mg/dL (ref 1.7–2.4)

## 2017-03-04 LAB — GLUCOSE, CAPILLARY
Glucose-Capillary: 145 mg/dL — ABNORMAL HIGH (ref 65–99)
Glucose-Capillary: 138 mg/dL — ABNORMAL HIGH (ref 65–99)
Glucose-Capillary: 99 mg/dL (ref 65–99)
Glucose-Capillary: 98 mg/dL (ref 65–99)
Glucose-Capillary: 101 mg/dL — ABNORMAL HIGH (ref 65–99)

## 2017-03-04 LAB — PREPARE RBC (CROSSMATCH)

## 2017-03-04 LAB — PHOSPHORUS: Phosphorus: 2.8 mg/dL (ref 2.5–4.6)

## 2017-03-04 MED ORDER — FAT EMULSION 20 % IV EMUL
250.0000 mL | INTRAVENOUS | Status: AC
  Administered 2017-03-04: 250 mL via INTRAVENOUS
  Filled 2017-03-04: qty 250

## 2017-03-04 MED ORDER — TRACE MINERALS CR-CU-MN-SE-ZN 10-1000-500-60 MCG/ML IV SOLN
INTRAVENOUS | Status: AC
  Administered 2017-03-04: 17:00:00 via INTRAVENOUS
  Filled 2017-03-04: qty 1920

## 2017-03-04 MED ORDER — SODIUM CHLORIDE 0.9 % IV SOLN
Freq: Once | INTRAVENOUS | Status: AC
  Administered 2017-03-04: 10:00:00 via INTRAVENOUS

## 2017-03-04 NOTE — Progress Notes (Signed)
Nutrition Follow-up  DOCUMENTATION CODES:   Non-severe (moderate) malnutrition in context of acute illness/injury, Obesity unspecified  INTERVENTION:   -TPN management per pharmacy -If tube feed initiated recommend: Vital AF 1.5@ 74m/hr Provides: 1980kcals, 89grams protein, 10038mfree water (meets 102% calories and 100% of protein needs)  NUTRITION DIAGNOSIS:   Malnutrition related to chronic illness, acute illness as evidenced by mild depletion of muscle mass, mild depletion of body fat.  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 Had prolonged postop course due to delayed gastric emptying due to gastroparesis and intolerance of TNA, ultimately started on TPN. Represents from SNF after being discharged for less than 12 hours, due to vomiting and abdominal pain.   6/27- s/p endoscopy, which revealed normal esophagus, gastrojejunostmy with healthy appearing mucosa, and stomach with a lot of bilious fluid 6/27- NGT placed 6/30- per [facility] notes, unobstructed outflow from stomach, but no motility (outflow seems gravity dependent) 7/10- GJ tube placed  Continue G tube port to gravity. IR feels GJ tube will not stay in correct position, may need jejunostomy for TF, awaiting decision.   7/17- Per pharmacy note, pt remains on TPN- Clinimix-E 5/15 at 80 ml/hr and 20% ILE at 20 ml/hr over 12 hrs. TPN provides 1843 kCal and 96gm of protein per day, meeting 100% of needs.   Labs reviewed: CBG 142-214  Diet Order:  Diet NPO time specified Except for: Sips with Meds TPN (CLINIMIX-E) Adult TPN (CLINIMIX-E) Adult  Skin:  Wound (see comment) (Closed Abdominal Incision)  Last BM:   02/19/17  Height:   Ht Readings from Last 1 Encounters:  02/06/17 5' 4"  (1.626 m)    Weight:   Wt Readings from Last 1 Encounters:  02/22/17 223 lb 9.6 oz (101.4 kg)    Ideal Body Weight:  54.54 kg  BMI:  Body mass index is 38.38 kg/m.  Estimated Nutritional Needs:   Kcal:  1800-1950 (20-22 kcal/kg bw)  Protein:  82-93g Pro (1.5-1.7g/kg ibw)  Fluid:  1.8-2 L fluid (1 ml/kcal)  EDUCATION NEEDS:   No education needs identified at this time  [place] HeightsLDN Pager # - [phone]

## 2017-03-04 NOTE — Progress Notes (Addendum)
CRITICAL VALUE ALERT  Critical Value:  Hgb=6.8  Date & Time Notied:  0542  Provider Notified: Dr. Hulen Skains  Orders Received/Actions taken: Transfuse 1 unit of PRBC

## 2017-03-04 NOTE — Progress Notes (Signed)
22 Days Post-Op   Subjective/Chief Complaint: L abd wall pain is a little better   Objective: Vital signs in last 24 hours: Temp:  [98.4 F (36.9 C)-99.1 F (37.3 C)] 98.4 F (36.9 C) (07/19 0453) Pulse Rate:  [67-78] 67 (07/19 0453) Resp:  [16-22] 16 (07/19 0453) BP: (132-156)/(48-64) 132/48 (07/19 0453) SpO2:  [98 %-100 %] 100 % (07/19 0453) Last BM Date:  (pt states small bm on 02/27/17)  Intake/Output from previous day: 07/18 0701 - 07/19 0700 In: 5030.5 [I.V.:4980.5; IV Piggyback:50] Out: 1250 [Urine:1250] Intake/Output this shift: No intake/output data recorded.  General appearance: cooperative Resp: clear to auscultation bilaterally Cardio: regular rate and rhythm GI: soft, L rectus hematoma, G tube  Lab Results:   Recent Labs  03/03/17 0534 03/04/17 0430  WBC 24.8* 16.6*  HGB 8.0* 6.8*  HCT 23.6* 20.4*  PLT 207 184   BMET  Recent Labs  03/03/17 0534 03/04/17 0430  NA 131* 135  K 4.0 4.2  CL 105 110  CO2 19* 21*  GLUCOSE 214* 142*  BUN 57* 40*  CREATININE 1.73* 1.19*  CALCIUM 7.8* 7.8*   PT/INR  Recent Labs  03/03/17 0852  LABPROT 16.6*  INR 1.33   ABG No results for input(s): PHART, HCO3 in the last 72 hours.  Invalid input(s): PCO2, PO2  Studies/Results: Ct Abdomen Pelvis Wo Contrast  Addendum Date: 03/02/2017   ADDENDUM REPORT: 03/02/2017 09:38 ADDENDUM: Study discussed by telephone with Dr. Georganna Skeans on 03/02/2017 at 09:38 . Electronically Signed   By: Genevie Ann M.D.   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 Cryoablation Status post gastrostomy tube. Possible abdominal wall hematoma. Lower abdominal pain. EXAM: CT ABDOMEN AND PELVIS WITHOUT CONTRAST TECHNIQUE: Multidetector CT imaging of the abdomen and pelvis was performed following the standard protocol without IV contrast.  COMPARISON:  [facility]UB 02/24/2017. Postoperative CT Abdomen and Pelvis 02/06/2017. FINDINGS: Lower chest: Stable cardiomegaly. Calcified coronary artery atherosclerosis. No pericardial effusion. No pleural effusion. Continued left lower lobe peribronchial opacity, probably atelectasis. Hepatobiliary: On this noncontrast study there is new heterogeneity and indistinct hypodensity seen throughout much of the liver parenchyma. This is most apparent at the junction of the left and right hepatic lobes on series 3, image 28 encompassing about 6 cm. Surgically absent gallbladder. Pancreas: Status post Whipple. There is a stable main pancreatic duct stent which terminates at the pancreatico-jejunal anastomosis. Spleen: Negative. Adrenals/Urinary Tract: Normal adrenal glands. Stable noncontrast kidneys. Stomach/Bowel: Sequelae of Whipple procedure again noted. Gastrostomy tube now in place, and is along the superior margin of the rectus muscle hematoma described below. Retained barium contrast in some small distal bowel loops, and throughout the colon from the cecum to the splenic flexure. Retained barium in descending and sigmoid diverticula. No dilated bowel. Vascular/Lymphatic: Calcified aortic atherosclerosis. Vascular patency is not evaluated in the absence of IV contrast. Reproductive: Surgically absent uterus. Stable an negative adnexa with dystrophic calcifications. Other: Mild presacral stranding. No pelvic free fluid. Ventral midline abdominal incision is stable with no adverse features. Musculoskeletal: Stable heterogeneous bone mineralization throughout the visible spine. Previous lower lumbar interbody cage implants. Stable visualized osseous structures. New large left rectus intramuscular hematoma with heterogeneous density encompassing up to 8.4 x 8.7 x 23.2 cm (estimated volume 848 mL). Mild enlargement of the adjacent oblique left abdominal musculature. Mild soft tissue inflammatory stranding surrounding the  hematoma there  is hematoma extension into the space of Retzius with mass effect on the urinary bladder, more so on the there is left. There is also some extension along the left pelvic side wall along the obturator musculature. IMPRESSION: 1. Large intramuscular hematoma extending from the rectus muscle into the left space of Retzius, and along the left pelvic sidewall. Estimated hematoma is 8 x 9 x 23 cm, with volume as large is 848 mL. Regional mass effect including on the left aspect of the urinary bladder. 2. Suspicious new heterogeneity of the noncontrast liver parenchyma, raising the possibility of new hepatic metastatic disease. 3. Gastrostomy tube in place.  No bowel obstruction. 4. Stable pancreatic duct stent. Electronically Signed: By: Genevie Ann M.D. On: 03/02/2017 09:34   Ir Ivc Filter Plmt / S&i /img Guid/mod Sed  Result Date: 03/03/2017 INDICATION: DVT, recently anticoagulated, acute rectus sheath hematoma. EXAM: ULTRASOUND GUIDANCE FOR VASCULAR ACCESS IVC CATHETERIZATION AND VENOGRAM IVC FILTER INSERTION Date:  7/18/20187/18/2018 5:00 pm Radiologist:  M. Daryll Brod, MD Guidance:  Ultrasound and fluoroscopic CONTRAST:  CO2, creatinine is 1.7 MEDICATIONS: 1% lidocaine locally ANESTHESIA/SEDATION: 1.0 mg IV Versed; 50 mcg IV Fentanyl Moderate Sedation Time:  9 minutes The patient was continuously monitored during the procedure by the interventional radiology nurse under my direct supervision. FLUOROSCOPY TIME:  Fluoroscopy Time: 54 seconds (140 mGy). COMPLICATIONS: None immediate. PROCEDURE: Informed consent was obtained from the patient following explanation of the procedure, risks, benefits and alternatives. The patient understands, agrees and consents for the procedure. All questions were addressed. A time out was performed. Maximal barrier sterile technique utilized including caps, mask, sterile gowns, sterile gloves, large sterile drape, hand hygiene, and betadine prep. Under sterile condition  and local anesthesia, right internal jugular venous access was performed with ultrasound. Over a guide wire, the IVC filter delivery sheath and inner dilator were advanced into the IVC just above the IVC bifurcation. CO2 injection was performed for an IVC venogram. IVC VENOGRAM: The IVC is patent. No evidence of thrombus, stenosis, or occlusion. No variant venous anatomy. The renal veins are identified at L1. IVC anatomy was correlated with a prior CT. IVC FILTER INSERTION: Through the delivery sheath, the Bard Denali IVC filter was deployed in the infrarenal IVC at the L2-3 level just below the renal veins and above the IVC bifurcation. Contrast injection confirmed position. There is good apposition of the filter against the IVC. The delivery sheath was removed and hemostasis was obtained with compression for 5 minutes. The patient tolerated the procedure well. No immediate complications. IMPRESSION: Ultrasound and fluoroscopically guided infrarenal IVC filter insertion. PLAN: This IVC filter is potentially retrievable. The patient will be assessed for filter retrieval by Interventional Radiology in approximately 8-12 weeks. Further recommendations regarding filter retrieval, continued surveillance or declaration of device permanence, will be made at that time. Electronically Signed   By: Jerilynn Mages.  Shick M.D.   On: 03/03/2017 17:04    Anti-infectives: Anti-infectives    Start     Dose/Rate Route Frequency Ordered Stop   02/16/17 1500  ceFAZolin (ANCEF) IVPB 2g/100 mL premix     2 g 200 mL/hr over 30 Minutes Intravenous To Radiology 02/16/17 1447 02/16/17 1613   02/09/17 0600  erythromycin (EES) 400 MG/5ML suspension 200 mg     200 mg Oral Every 6 hours 02/09/17 0147     02/07/17 0000  erythromycin (EES) 400 MG/5ML suspension 400 mg  Status:  Discontinued     400 mg Oral Every 6 hours 02/07/17 1932  02/09/17 0147   02/06/17 0600  erythromycin ethylsuccinate (EES) 200 MG/5ML suspension 400 mg  Status:   Discontinued     400 mg Oral Every 6 hours 02/06/17 0324 02/07/17 1932      Assessment/Plan: Gastroparesis post-Whipple - SIPS and chips - continue TPN for prolonged ileus/protein calorie malnutrition - Con't Gtube port to gravity      - May need surgical jejunostomy for TF - will defer to Dr. Marlowe Aschoff decision on this CRT elevation       - improving, good U/O Large L rectus hematoma       - from anticoagulation. Stopped Lovenox.       - TF 1u PRBC Leukocytosis       - urine CX neg       - afeb       - may be due to hematoma LLE DVT       - have to hold Lovenox       - IVC filter placed 7/18 PT/OT  LOS: 26 days    Necha Harries E 03/04/2017

## 2017-03-04 NOTE — Progress Notes (Signed)
PHARMACY - ADULT TOTAL PARENTERAL NUTRITION CONSULT NOTE   Pharmacy Consult:  TPN Indication: Ileus  Patient Measurements: Height: 5\' 4"  (162.6 cm) Weight: 223 lb 9.6 oz (101.4 kg) IBW/kg (Calculated) : 54.7 TPN AdjBW (KG): 63.5 Body mass index is 38.38 kg/m.  Assessment:  55 YOF with severe malnutrition related to renal cell and pancreatic cancer who presented with abdominal mass on 01/12/17.  She is s/p Whipple procedure on the same day for pancreatic cancer.  She was discharged 02/05/17 and readmitted same day for vomiting at SNF. Pharmacy consulted to manage TPN for ileus that prevented oral intake and caused significant nausea and vomiting.   GI:  Gastroparesis s/p Whipple. Decreased abd wall pain.  Prealbumin improved to 15.6, drain O/P 54mL, LBM 7/6.  S/p GJ 7/10, malpositioned, to be replaced with surgical FT.  Erythromycin for gastroparesis- refusing, PPI IV, PRN Zofran/Phenergan (QTc 479ms on 6/17). *6/27 EGD - no acute findings, a lot of bilious fluid.  *Repeat GES - no obstruction, no motility of stomach  Endo: hypothyroid on Synthroid.  DM not on med PTA - CBGs 123-148, improved control with increase insulin in TPN on 7/18. Insulin requirements in the past 24 hours:  21 units SSI + 80 units in TPN Lytes:  Na 135, K 4.2, Mg 2.4, Phos 2.8.   No lytes: 7/17 Renal: AKI - Cr 1.19- cont to correct (pk 2.52 on 7/17).  BUN 40 improved - UOP 0.5 ml/kg/hr, NS at 45 ml/hr Pulm: stable on RA Cards: HTN - BP soft, HR wnl - Norvasc, Toprol, PRN nifedipine XL  Hepatobil: LFTs / tbili / TG WNL- increased Neuro: hx CVA 2016 with residual L-hand and foot weakness - PRN Dilaudid/oxy ID: Nystatin for thrush - afebrile, WBC 16.6 improved- poss elevated 2nd hematoma AC:  (+)VTE.  CT (+) large rectus hematoma.  Lovenox stopped 7/17.  IVC filter placed 7/18.  Hg down again- 6.8, transfusing, Pltc wnl- trending down  Best Practices: SCDs  TPN Access: triple lumen PICC placed 6/11 - replaced  02/02/17 TPN start date: 01/23/17   Nutritional Goals (per RD recommendation on 7/3): 1800-1950 kCal and 82-93 g protein per day  Current Nutrition: Sips and ice chips   TPN   Plan:   - Clinimix-E 5/15 at 80 ml/hr and 20% ILE at 20 ml/hr over 12 hrs.  TPN provides 1843 kCal and 96gm of protein per day, meeting 100% of needs. - Daily multivitamin in TPN - Trace elements in TPN every other day, next 7/19 - Add Insulin 80 units to TPN  - Continue resistant SSI Q4H  - NS at 35ml/hr (for total IVF + TPN 12ml/hr per d/w DrThompson 7/17). - F/U with placement of surgical FT to initiate tube feed - Consider transition to Cyclic TPN 3/76 if CBGs remain at goal today. - BMet, Phos.  DBIV, enter consult & alert to labs ordered   Lewie Chamber., PharmD Clinical Pharmacist [facility]

## 2017-03-05 LAB — TYPE AND SCREEN
ABO/RH(D): B POS
Antibody Screen: NEGATIVE
Unit division: 0
Unit division: 0
Unit division: 0

## 2017-03-05 LAB — BPAM RBC
Blood Product Expiration Date: 201808012359
Blood Product Expiration Date: 201808032359
Blood Product Expiration Date: 201808042359
ISSUE DATE / TIME: 201807171227
ISSUE DATE / TIME: 201807171614
ISSUE DATE / TIME: 201807190917
Unit Type and Rh: 1700
Unit Type and Rh: 7300
Unit Type and Rh: 7300

## 2017-03-05 LAB — BASIC METABOLIC PANEL
Anion gap: 6 (ref 5–15)
BUN: 28 mg/dL — ABNORMAL HIGH (ref 6–20)
CO2: 21 mmol/L — ABNORMAL LOW (ref 22–32)
Calcium: 7.9 mg/dL — ABNORMAL LOW (ref 8.9–10.3)
Chloride: 111 mmol/L (ref 101–111)
Creatinine, Ser: 0.92 mg/dL (ref 0.44–1.00)
GFR calc Af Amer: >60 mL/min (ref >60)
GFR calc non Af Amer: 58 mL/min — ABNORMAL LOW (ref >60)
Glucose, Bld: 104 mg/dL — ABNORMAL HIGH (ref 65–99)
Potassium: 4 mmol/L (ref 3.5–5.1)
Sodium: 138 mmol/L (ref 135–145)

## 2017-03-05 LAB — CBC
HCT: 24.5 % — ABNORMAL LOW (ref 36.0–46.0)
Hemoglobin: 7.9 g/dL — ABNORMAL LOW (ref 12.0–15.0)
MCH: 29.3 pg (ref 26.0–34.0)
MCHC: 32.2 g/dL (ref 30.0–36.0)
MCV: 90.7 fL (ref 78.0–100.0)
Platelets: 209 10*3/uL (ref 150–400)
RBC: 2.7 MIL/uL — ABNORMAL LOW (ref 3.87–5.11)
RDW: 17.3 % — ABNORMAL HIGH (ref 11.5–15.5)
WBC: 11.5 10*3/uL — ABNORMAL HIGH (ref 4.0–10.5)

## 2017-03-05 LAB — GLUCOSE, CAPILLARY
Glucose-Capillary: 102 mg/dL — ABNORMAL HIGH (ref 65–99)
Glucose-Capillary: 112 mg/dL — ABNORMAL HIGH (ref 65–99)
Glucose-Capillary: 79 mg/dL (ref 65–99)
Glucose-Capillary: 91 mg/dL (ref 65–99)
Glucose-Capillary: 97 mg/dL (ref 65–99)
Glucose-Capillary: 99 mg/dL (ref 65–99)

## 2017-03-05 LAB — PHOSPHORUS: Phosphorus: 3.2 mg/dL (ref 2.5–4.6)

## 2017-03-05 MED ORDER — FAT EMULSION 20 % IV EMUL
250.0000 mL | INTRAVENOUS | Status: AC
  Administered 2017-03-05: 250 mL via INTRAVENOUS
  Filled 2017-03-05: qty 250

## 2017-03-05 MED ORDER — FLUCONAZOLE 100 MG PO TABS
200.0000 mg | ORAL_TABLET | Freq: Once | ORAL | Status: AC
  Administered 2017-03-05: 200 mg via ORAL
  Filled 2017-03-05: qty 2

## 2017-03-05 MED ORDER — M.V.I. ADULT IV INJ
INTRAVENOUS | Status: AC
  Administered 2017-03-05: 17:00:00 via INTRAVENOUS
  Filled 2017-03-05: qty 1920

## 2017-03-05 MED ORDER — FAT EMULSION 20 % IV EMUL
250.0000 mL | INTRAVENOUS | Status: DC
Start: 2017-03-05 — End: 2017-03-05
  Filled 2017-03-05: qty 250

## 2017-03-05 MED ORDER — TRACE MINERALS CR-CU-MN-SE-ZN 10-1000-500-60 MCG/ML IV SOLN
INTRAVENOUS | Status: DC
  Filled 2017-03-05: qty 1920

## 2017-03-05 NOTE — Progress Notes (Signed)
PT Cancellation Note  Patient Details Name: PERNIE GROSSO MRN: [id number] DOB: 09-08-1935   Cancelled Treatment:    Reason Eval/Treat Not Completed: Patient declined, no reason specified (pt stated dizziness after dilaudid, fatigue and vaginal itching all inhibiting her and declined mobility. She did walk with mobility tech recently)   Siddharth Babington B Lashayla Armes 03/05/2017, 12:43 PM  Elwyn Reach, [place]

## 2017-03-05 NOTE — Progress Notes (Signed)
PHARMACY - ADULT TOTAL PARENTERAL NUTRITION CONSULT NOTE   Pharmacy Consult:  TPN Indication: Ileus  Patient Measurements: Height: 5\' 4"  (162.6 cm) Weight: 223 lb 9.6 oz (101.4 kg) IBW/kg (Calculated) : 54.7 TPN AdjBW (KG): 63.5 Body mass index is 38.38 kg/m.  Assessment:  41 YOF with severe malnutrition related to renal cell and pancreatic cancer who presented with abdominal mass on 01/12/17.  She is s/p Whipple procedure on the same day for pancreatic cancer.  She was discharged 02/05/17 and readmitted same day for vomiting at SNF. Pharmacy consulted to manage TPN for ileus that prevented oral intake and caused significant nausea and vomiting.   GI:  Gastroparesis s/p Whipple. Decreased abd wall pain.  Prealbumin improved to 15.6, drain O/P 31mL, LBM 7/6.  S/p GJ 7/10, malpositioned, to be replaced with surgical FT.  Erythromycin for gastroparesis- refusing, PPI IV, PRN Zofran/Phenergan (QTc 48ms on 6/17). *6/27 EGD - no acute findings, a lot of bilious fluid.  *Repeat GES - no obstruction, no motility of stomach  Endo: hypothyroid on Synthroid.  DM not on med PTA - CBGs 101-112.  Significant decrease in SSI needs in last 24hr. Insulin requirements in the past 24 hours: 3 units SSI + 80 units in TPN Lytes:  K 4, Phos 3.2.   No lytes: 7/17 Renal: AKI - resolved.  Cr < 1.  BUN 28 improved - UOP 0.6 ml/kg/hr, NS at 45 ml/hr Pulm: stable on RA Cards: HTN - BP soft, HR wnl - Norvasc, Toprol, PRN nifedipine XL  Hepatobil: LFTs / tbili / TG WNL- increased Neuro: hx CVA 2016 with residual L-hand and foot weakness - PRN Dilaudid/oxy ID: Nystatin for thrush - afebrile, WBC trending down- poss elevated 2nd hematoma AC:  (+)VTE.  CT (+) large rectus hematoma.  Lovenox stopped 7/17.  IVC filter placed 7/18.  Hg 7.9 improved after transfusion 7/19, Pltc wnl  Best Practices: SCDs  TPN Access: triple lumen PICC placed 6/11 - replaced 02/02/17 TPN start date: 01/23/17   Nutritional Goals (per RD  recommendation on 7/3): 1800-1950 kCal and 82-93 g protein per day  Current Nutrition: Sips and ice chips   TPN   Plan:   - Clinimix-E 5/15 at 80 ml/hr and 20% ILE at 20 ml/hr over 12 hrs.  TPN provides 1843 kCal and 96gm of protein per day, meeting 100% of needs. - Daily multivitamin in TPN - Trace elements in TPN every other day, next 7/21 - Decrease Insulin to 65 units in TPN - Continue resistant SSI Q4H  - NS at 25ml/hr (for total IVF + TPN 138ml/hr per d/w DrThompson 7/17). - F/U with placement of surgical FT to initiate tube feed - Consider transition to Cyclic TPN once CBGs stable.    Lewie Chamber., PharmD Clinical Pharmacist [facility] [phone] until 3P, then [phone]

## 2017-03-05 NOTE — Progress Notes (Signed)
23 Days Post-Op   Subjective/Chief Complaint: CC vaginal itching, L abd wall pain a little better   Objective: Vital signs in last 24 hours: Temp:  [98.2 F (36.8 C)-98.8 F (37.1 C)] 98.2 F (36.8 C) (07/20 0445) Pulse Rate:  [64-81] 73 (07/20 0445) Resp:  [18-24] 18 (07/20 0445) BP: (129-178)/(48-57) 129/53 (07/20 0445) SpO2:  [98 %-100 %] 100 % (07/19 2117) Last BM Date: 02/27/17  Intake/Output from previous day: 07/19 0701 - 07/20 0700 In: 2200 [P.O.:60; I.V.:1702; Blood:368; IV Piggyback:50] Out: 2620 [Urine:1450; Drains:200] Intake/Output this shift: Total I/O In: 1832 [P.O.:60; I.V.:1702; Other:20; IV Piggyback:50] Out: 1400 [Urine:1200; Drains:200]  General appearance: cooperative Resp: clear to auscultation bilaterally Cardio: regular rate and rhythm GI: L rectus hematoma, softer, G tube  Lab Results:   Recent Labs  03/04/17 0430 03/05/17 0511  WBC 16.6* 11.5*  HGB 6.8* 7.9*  HCT 20.4* 24.5*  PLT 184 209   BMET  Recent Labs  03/04/17 0430 03/05/17 0511  NA 135 138  K 4.2 4.0  CL 110 111  CO2 21* 21*  GLUCOSE 142* 104*  BUN 40* 28*  CREATININE 1.19* 0.92  CALCIUM 7.8* 7.9*   PT/INR  Recent Labs  03/03/17 0852  LABPROT 16.6*  INR 1.33   ABG No results for input(s): PHART, HCO3 in the last 72 hours.  Invalid input(s): PCO2, PO2  Studies/Results: Ir Ivc Filter Plmt / S&i /img Guid/mod Sed  Result Date: 03/03/2017 INDICATION: DVT, recently anticoagulated, acute rectus sheath hematoma. EXAM: ULTRASOUND GUIDANCE FOR VASCULAR ACCESS IVC CATHETERIZATION AND VENOGRAM IVC FILTER INSERTION Date:  7/18/20187/18/2018 5:00 pm Radiologist:  M. Daryll Brod, MD Guidance:  Ultrasound and fluoroscopic CONTRAST:  CO2, creatinine is 1.7 MEDICATIONS: 1% lidocaine locally ANESTHESIA/SEDATION: 1.0 mg IV Versed; 50 mcg IV Fentanyl Moderate Sedation Time:  9 minutes The patient was continuously monitored during the procedure by the interventional radiology  nurse under my direct supervision. FLUOROSCOPY TIME:  Fluoroscopy Time: 54 seconds (140 mGy). COMPLICATIONS: None immediate. PROCEDURE: Informed consent was obtained from the patient following explanation of the procedure, risks, benefits and alternatives. The patient understands, agrees and consents for the procedure. All questions were addressed. A time out was performed. Maximal barrier sterile technique utilized including caps, mask, sterile gowns, sterile gloves, large sterile drape, hand hygiene, and betadine prep. Under sterile condition and local anesthesia, right internal jugular venous access was performed with ultrasound. Over a guide wire, the IVC filter delivery sheath and inner dilator were advanced into the IVC just above the IVC bifurcation. CO2 injection was performed for an IVC venogram. IVC VENOGRAM: The IVC is patent. No evidence of thrombus, stenosis, or occlusion. No variant venous anatomy. The renal veins are identified at L1. IVC anatomy was correlated with a prior CT. IVC FILTER INSERTION: Through the delivery sheath, the Bard Denali IVC filter was deployed in the infrarenal IVC at the L2-3 level just below the renal veins and above the IVC bifurcation. Contrast injection confirmed position. There is good apposition of the filter against the IVC. The delivery sheath was removed and hemostasis was obtained with compression for 5 minutes. The patient tolerated the procedure well. No immediate complications. IMPRESSION: Ultrasound and fluoroscopically guided infrarenal IVC filter insertion. PLAN: This IVC filter is potentially retrievable. The patient will be assessed for filter retrieval by Interventional Radiology in approximately 8-12 weeks. Further recommendations regarding filter retrieval, continued surveillance or declaration of device permanence, will be made at that time. Electronically Signed   By: Jerilynn Mages.  Shick  M.D.   On: 03/03/2017 17:04    Anti-infectives: Anti-infectives    Start      Dose/Rate Route Frequency Ordered Stop   03/05/17 0645  fluconazole (DIFLUCAN) tablet 200 mg     200 mg Oral  Once 03/05/17 0636     02/16/17 1500  ceFAZolin (ANCEF) IVPB 2g/100 mL premix     2 g 200 mL/hr over 30 Minutes Intravenous To Radiology 02/16/17 1447 02/16/17 1613   02/09/17 0600  erythromycin (EES) 400 MG/5ML suspension 200 mg     200 mg Oral Every 6 hours 02/09/17 0147     02/07/17 0000  erythromycin (EES) 400 MG/5ML suspension 400 mg  Status:  Discontinued     400 mg Oral Every 6 hours 02/07/17 1932 02/09/17 0147   02/06/17 0600  erythromycin ethylsuccinate (EES) 200 MG/5ML suspension 400 mg  Status:  Discontinued     400 mg Oral Every 6 hours 02/06/17 0324 02/07/17 1932      Assessment/Plan: Gastroparesis post-Whipple - SIPS and chips - continue TPN for prolonged ileus/protein calorie malnutrition - Con't Gtube port to gravity      - May need surgical jejunostomy for TF - will defer to Dr. Marlowe Aschoff decision on this Likely vaginal candidiasis       - Diflucan X 1 CRT elevation       - resolved Large L rectus hematoma       - from anticoagulation. Stopped Lovenox.       - Hb up S/P TF 1u PRBC Leukocytosis       - urine CX neg       - resolving       - may be due to hematoma LLE DVT       - have to hold Lovenox       - IVC filter placed 7/18 PT/OT  LOS: 27 days    Reta Norgren E 03/05/2017

## 2017-03-05 NOTE — Care Management Important Message (Signed)
Important Message  Patient Details  Name: Kaylee Mullins MRN: [id number] Date of Birth: [birth year]   Medicare Important Message Given:  Yes    Nathen May 03/05/2017, 10:48 AM

## 2017-03-06 LAB — CBC
HCT: 25.7 % — ABNORMAL LOW (ref 36.0–46.0)
Hemoglobin: 8.3 g/dL — ABNORMAL LOW (ref 12.0–15.0)
MCH: 29.6 pg (ref 26.0–34.0)
MCHC: 32.3 g/dL (ref 30.0–36.0)
MCV: 91.8 fL (ref 78.0–100.0)
Platelets: 259 10*3/uL (ref 150–400)
RBC: 2.8 MIL/uL — ABNORMAL LOW (ref 3.87–5.11)
RDW: 17.1 % — ABNORMAL HIGH (ref 11.5–15.5)
WBC: 11.4 10*3/uL — ABNORMAL HIGH (ref 4.0–10.5)

## 2017-03-06 LAB — GLUCOSE, CAPILLARY
Glucose-Capillary: 116 mg/dL — ABNORMAL HIGH (ref 65–99)
Glucose-Capillary: 124 mg/dL — ABNORMAL HIGH (ref 65–99)
Glucose-Capillary: 126 mg/dL — ABNORMAL HIGH (ref 65–99)
Glucose-Capillary: 142 mg/dL — ABNORMAL HIGH (ref 65–99)
Glucose-Capillary: 96 mg/dL (ref 65–99)

## 2017-03-06 MED ORDER — TRACE MINERALS CR-CU-MN-SE-ZN 10-1000-500-60 MCG/ML IV SOLN
INTRAVENOUS | Status: DC
  Filled 2017-03-06: qty 1919

## 2017-03-06 MED ORDER — CLINIMIX E/DEXTROSE (5/15) 5 % IV SOLN: INTRAVENOUS | Status: DC

## 2017-03-06 MED ORDER — TRACE MINERALS CR-CU-MN-SE-ZN 10-1000-500-60 MCG/ML IV SOLN
INTRAVENOUS | Status: AC
  Administered 2017-03-06: 18:00:00 via INTRAVENOUS
  Filled 2017-03-06: qty 1920

## 2017-03-06 MED ORDER — FAT EMULSION 20 % IV EMUL
240.0000 mL | INTRAVENOUS | Status: DC
Start: 2017-03-06 — End: 2017-03-06
  Filled 2017-03-06: qty 250

## 2017-03-06 MED ORDER — FAT EMULSION 20 % IV EMUL
240.0000 mL | INTRAVENOUS | Status: AC
  Administered 2017-03-06: 240 mL via INTRAVENOUS
  Filled 2017-03-06: qty 250

## 2017-03-06 MED ORDER — FAT EMULSION 20 % IV EMUL: 240.0000 mL | INTRAVENOUS | Status: DC

## 2017-03-06 NOTE — Progress Notes (Signed)
Pt had a BM

## 2017-03-06 NOTE — Clinical Social Work Note (Signed)
CSW continues to follow for discharge needs.  Telia Amundson, CSW [phone]  

## 2017-03-06 NOTE — Progress Notes (Signed)
Patient refused all scheduled medications this shift.

## 2017-03-06 NOTE — Progress Notes (Signed)
24 Days Post-Op   Subjective/Chief Complaint: CC everything hurts No acute issues. Reports nausea even with Gtube to drainage.     Objective: Vital signs in last 24 hours: Temp:  [98.5 F (36.9 C)-99.8 F (37.7 C)] 99.7 F (37.6 C) (07/21 0437) Pulse Rate:  [66-76] 76 (07/21 0437) Resp:  [17-18] 18 (07/21 0437) BP: (150-161)/(55-58) 161/57 (07/21 0437) SpO2:  [97 %-99 %] 97 % (07/21 0437) Last BM Date: 02/27/17  Intake/Output from previous day: 07/20 0701 - 07/21 0700 In: 2143.2 [I.V.:2063.2; IV Piggyback:60] Out: 2000 [Urine:2000] Intake/Output this shift: No intake/output data recorded.  Alert and cooperative Unlabored respirations Abdomen mildly distended, G tube in place to gravity bag, softening rectus hematoma, midline incision healed Skin is warm and dry  Lab Results:   Recent Labs  03/05/17 0511 03/06/17 0459  WBC 11.5* 11.4*  HGB 7.9* 8.3*  HCT 24.5* 25.7*  PLT 209 259   BMET  Recent Labs  03/04/17 0430 03/05/17 0511  NA 135 138  K 4.2 4.0  CL 110 111  CO2 21* 21*  GLUCOSE 142* 104*  BUN 40* 28*  CREATININE 1.19* 0.92  CALCIUM 7.8* 7.9*   PT/INR  Recent Labs  03/03/17 0852  LABPROT 16.6*  INR 1.33   ABG No results for input(s): PHART, HCO3 in the last 72 hours.  Invalid input(s): PCO2, PO2  Studies/Results: No results found.  Anti-infectives: Anti-infectives    Start     Dose/Rate Route Frequency Ordered Stop   03/05/17 0645  fluconazole (DIFLUCAN) tablet 200 mg     200 mg Oral  Once 03/05/17 0636 03/05/17 0657   02/16/17 1500  ceFAZolin (ANCEF) IVPB 2g/100 mL premix     2 g 200 mL/hr over 30 Minutes Intravenous To Radiology 02/16/17 1447 02/16/17 1613   02/09/17 0600  erythromycin (EES) 400 MG/5ML suspension 200 mg     200 mg Oral Every 6 hours 02/09/17 0147     02/07/17 0000  erythromycin (EES) 400 MG/5ML suspension 400 mg  Status:  Discontinued     400 mg Oral Every 6 hours 02/07/17 1932 02/09/17 0147   02/06/17 0600   erythromycin ethylsuccinate (EES) 200 MG/5ML suspension 400 mg  Status:  Discontinued     400 mg Oral Every 6 hours 02/06/17 0324 02/07/17 1932      Assessment/Plan: Gastroparesis post-Whipple - SIPS and chips - continue TPN for prolonged ileus/protein calorie malnutrition - Con't Gtube port to gravity      - May need surgical jejunostomy for TF - will defer to Dr. Marlowe Aschoff decision on this Likely vaginal candidiasis       - Diflucan X 1 yesterday, less symptomatic now CRT elevation       - resolved Large L rectus hematoma       - from anticoagulation. Stopped Lovenox.       - Hb stable Leukocytosis       - urine CX neg       - stable today       - may be due to hematoma LLE DVT       - have to hold Lovenox       - IVC filter placed 7/18 PT/OT  LOS: 28 days    Kaylee Mullins 03/06/2017

## 2017-03-06 NOTE — Progress Notes (Addendum)
PHARMACY - ADULT TOTAL PARENTERAL NUTRITION CONSULT NOTE   Pharmacy Consult:  TPN Indication: Ileus  Patient Measurements: Height: 5\' 4"  (162.6 cm) Weight: 223 lb 9.6 oz (101.4 kg) IBW/kg (Calculated) : 54.7 TPN AdjBW (KG): 63.5 Body mass index is 38.38 kg/m.  Assessment:  65 YOF with severe malnutrition related to renal cell and pancreatic cancer who presented with abdominal mass on 01/12/17.  She is s/p Whipple procedure on the same day for pancreatic cancer.  She was discharged 02/05/17 and readmitted same day for vomiting at SNF. Pharmacy consulted to manage TPN for ileus that prevented oral intake and caused significant nausea and vomiting.   GI:  Gastroparesis s/p Whipple. Decreased abd wall pain.  Prealbumin improved to 15.6, drain O/P 276mL, LBM 7/14.  S/p GJ 7/10, malpositioned, to be replaced with surgical FT.  Erythromycin for gastroparesis- refusing, PPI IV, PRN Zofran/Phenergan (QTc 445ms on 6/17). *6/27 EGD - no acute findings, a lot of bilious fluid.  *Repeat GES - no obstruction, no motility of stomach  Endo: hypothyroid on Synthroid.  DM not on med PTA - CBGs 101-112.  Significant decrease in SSI needs in last 24hr. Insulin requirements in the past 24 hours: 3 units SSI + 80 units in TPN Lytes:  No new labs today - lytes stable yesterday Renal: AKI - resolved.  Cr < 1.  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  CT (+) large rectus hematoma.  Lovenox stopped 7/17.  IVC filter placed 7/18.  Hg 7.9 improved after transfusion 7/19, Pltc wnl  Best Practices: SCDs  TPN Access: triple lumen PICC placed 6/11 - replaced 02/02/17 TPN start date: 01/23/17   Nutritional  Goals (per RD recommendation on 7/3): 1800-1950 kCal and 82-93 g protein per day  Current Nutrition: Sips and ice chips   TPN   Plan:   - Change to cyclic Clinimix-E 3/38 - infuse infuse 1920 mls over 16 hours: 85 ml/hr x 1 hr, then 125 ml/hr x 14 hrs, then 85 ml/hr x 1 hr and 20% ILE at 20 ml/hr over 12 hrs.  TPN provides 1843 kCal and 96gm of protein per day, meeting 100% of needs. - Daily multivitamin in TPN - Trace elements in TPN every other day, next 7/23 - Continue current Insulin of 65 units in TPN - Continue resistant SSI Q4H  - NS at 63ml/hr (for total IVF + TPN 137ml/hr per d/w DrThompson 7/17). - F/U with placement of surgical FT to initiate tube feed  Rober Minion, PharmD., MS Clinical Pharmacist Pager:  [phone] Thank you for allowing pharmacy to be part of this patients care team.

## 2017-03-07 LAB — GLUCOSE, CAPILLARY
Glucose-Capillary: 106 mg/dL — ABNORMAL HIGH (ref 65–99)
Glucose-Capillary: 112 mg/dL — ABNORMAL HIGH (ref 65–99)
Glucose-Capillary: 150 mg/dL — ABNORMAL HIGH (ref 65–99)
Glucose-Capillary: 159 mg/dL — ABNORMAL HIGH (ref 65–99)
Glucose-Capillary: 95 mg/dL (ref 65–99)

## 2017-03-07 LAB — SURGICAL PCR SCREEN
MRSA, PCR: NEGATIVE
Staphylococcus aureus: NEGATIVE

## 2017-03-07 MED ORDER — FAT EMULSION 20 % IV EMUL
240.0000 mL | INTRAVENOUS | Status: AC
  Administered 2017-03-07: 240 mL via INTRAVENOUS
  Filled 2017-03-07: qty 250

## 2017-03-07 MED ORDER — INSULIN ASPART 100 UNIT/ML ~~LOC~~ SOLN
0.0000 [IU] | SUBCUTANEOUS | Status: DC
  Administered 2017-03-07: 2 [IU] via SUBCUTANEOUS
  Administered 2017-03-08: 3 [IU] via SUBCUTANEOUS
  Administered 2017-03-08 (×2): 2 [IU] via SUBCUTANEOUS
  Administered 2017-03-08: 3 [IU] via SUBCUTANEOUS
  Administered 2017-03-09: 5 [IU] via SUBCUTANEOUS
  Administered 2017-03-09: 3 [IU] via SUBCUTANEOUS

## 2017-03-07 MED ORDER — M.V.I. ADULT IV INJ
INTRAVENOUS | Status: AC
  Administered 2017-03-07: 18:00:00 via INTRAVENOUS
  Filled 2017-03-07: qty 1920

## 2017-03-07 NOTE — Progress Notes (Addendum)
25 Days Post-Op   Subjective/Chief Complaint: Feeling a bit better today.  Had several bowel movements.  Many have been clay colored, but had a small yellow one.   Objective: Vital signs in last 24 hours: Temp:  [99 F (37.2 C)-99.3 F (37.4 C)] 99.3 F (37.4 C) (07/22 1452) Pulse Rate:  [72-79] 79 (07/22 1452) Resp:  [18-19] 18 (07/22 1452) BP: (155-164)/(59-60) 156/60 (07/22 1452) SpO2:  [99 %-100 %] 100 % (07/22 1452) Last BM Date: 03/06/17  Intake/Output from previous day: 07/21 0701 - 07/22 0700 In: 1189.6 [I.V.:1189.6] Out: 1131 [Urine:1100; Drains:30; Stool:1] Intake/Output this shift: Total I/O In: 0  Out: 100 [Drains:100]  Alert and cooperative Unlabored respirations Abdomen mildly distended (at baseline), G tube in place to gravity bag, softening rectus hematoma, midline incision healed Skin is warm and dry  Lab Results:   Recent Labs  03/05/17 0511 03/06/17 0459  WBC 11.5* 11.4*  HGB 7.9* 8.3*  HCT 24.5* 25.7*  PLT 209 259   BMET  Recent Labs  03/05/17 0511  NA 138  K 4.0  CL 111  CO2 21*  GLUCOSE 104*  BUN 28*  CREATININE 0.92  CALCIUM 7.9*   PT/INR No results for input(s): LABPROT, INR in the last 72 hours. ABG No results for input(s): PHART, HCO3 in the last 72 hours.  Invalid input(s): PCO2, PO2  Studies/Results: No results found.  Anti-infectives: Anti-infectives    Start     Dose/Rate Route Frequency Ordered Stop   03/05/17 0645  fluconazole (DIFLUCAN) tablet 200 mg     200 mg Oral  Once 03/05/17 0636 03/05/17 0657   02/16/17 1500  ceFAZolin (ANCEF) IVPB 2g/100 mL premix     2 g 200 mL/hr over 30 Minutes Intravenous To Radiology 02/16/17 1447 02/16/17 1613   02/09/17 0600  erythromycin (EES) 400 MG/5ML suspension 200 mg     200 mg Oral Every 6 hours 02/09/17 0147     02/07/17 0000  erythromycin (EES) 400 MG/5ML suspension 400 mg  Status:  Discontinued     400 mg Oral Every 6 hours 02/07/17 1932 02/09/17 0147   02/06/17  0600  erythromycin ethylsuccinate (EES) 200 MG/5ML suspension 400 mg  Status:  Discontinued     400 mg Oral Every 6 hours 02/06/17 0324 02/07/17 1932      Assessment/Plan: Gastroparesis post-Whipple - SIPS and chips - continue TPN for prolonged ileus/protein calorie malnutrition - Con't Gtube port to gravity      -Will plan surgical J tube.  Possibly in AM tomorrow.  If not then, later in week. Likely vaginal candidiasis       - Diflucan X 1 yesterday, less symptomatic now CRT elevation       - resolved Large L rectus hematoma       - from anticoagulation. Stopped Lovenox.       - Hb stable    LLE DVT       - have to hold Lovenox       - IVC filter placed 7/18 PT/OT  Discussed risk of feeding tube placement.   LOS: 29 days    Kaylee Mullins 03/07/2017

## 2017-03-07 NOTE — Progress Notes (Signed)
PHARMACY - ADULT TOTAL PARENTERAL NUTRITION CONSULT NOTE   Pharmacy Consult:  TPN Indication: Ileus  Patient Measurements: Height: 5\' 4"  (162.6 cm) Weight: 223 lb 3.2 oz (101.2 kg) IBW/kg (Calculated) : 54.7 TPN AdjBW (KG): 63.5 Body mass index is 38.31 kg/m.  Assessment:  54 YOF with severe malnutrition related to renal cell and pancreatic cancer who presented with abdominal mass on 01/12/17.  She is s/p Whipple procedure on the same day for pancreatic cancer.  She was discharged 02/05/17 and readmitted same day for vomiting at SNF. Pharmacy consulted to manage TPN for ileus that prevented oral intake and caused significant nausea and vomiting.   GI:  Gastroparesis s/p Whipple. Decreased abd wall pain.  Prealbumin improved to 15.6, drain O/P 217mL, LBM 7/22.  S/p GJ 7/10, malpositioned, to be replaced with surgical FT.  Erythromycin for gastroparesis- refusing, PPI IV, PRN Zofran/Phenergan (QTc 445ms on 6/17). *6/27 EGD - no acute findings, a lot of bilious fluid.  *Repeat GES - no obstruction, no motility of stomach  Endo: hypothyroid on Synthroid.  DM not on med PTA - CBGs <150  Significant decrease in SSI needs in last 24hr. Insulin requirements in the past 24 hours: 3 units SSI + 65 units in TPN Lytes:  No new labs today - lytes stable yesterday Renal: AKI - resolved.  Cr < 1.  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  CT (+) large rectus hematoma.  Lovenox stopped 7/17.  IVC filter placed 7/18.  Hg 7.9>>8.3 improved after transfusion 7/19, Pltc wnl  Best Practices: SCDs  TPN Access: triple lumen PICC placed 6/11 - replaced 02/02/17 TPN start date: 01/23/17    Nutritional Goals (per RD recommendation on 7/3): 1800-1950 kCal and 82-93 g protein per day  Current Nutrition: Sips and ice chips   TPN  Plan:   - Continue cyclic Clinimix-E 5/92 - infuse infuse 1920 mls over 16 hours: 85 ml/hr x 1 hr, then 125 ml/hr x 14 hrs, then 85 ml/hr x 1 hr and 20% ILE at 20 ml/hr over 12 hrs.  TPN provides 1843 kCal and 96gm of protein per day, meeting 100% of needs. - Daily multivitamin in TPN - Trace elements in TPN every other day, next 7/23 - Decrease current Insulin to 55 units in TPN - Change to moderate  SSI Q6H  - NS at 82ml/hr (for total IVF + TPN 147ml/hr per d/w DrThompson 7/17). - F/U with placement of surgical FT to initiate tube feed  Rober Minion, PharmD., MS Clinical Pharmacist Pager:  [phone] Thank you for allowing pharmacy to be part of this patients care team.

## 2017-03-08 LAB — GLUCOSE, CAPILLARY
Glucose-Capillary: 136 mg/dL — ABNORMAL HIGH (ref 65–99)
Glucose-Capillary: 142 mg/dL — ABNORMAL HIGH (ref 65–99)
Glucose-Capillary: 153 mg/dL — ABNORMAL HIGH (ref 65–99)
Glucose-Capillary: 157 mg/dL — ABNORMAL HIGH (ref 65–99)
Glucose-Capillary: 190 mg/dL — ABNORMAL HIGH (ref 65–99)

## 2017-03-08 LAB — COMPREHENSIVE METABOLIC PANEL
ALT: 55 U/L — ABNORMAL HIGH (ref 14–54)
AST: 29 U/L (ref 15–41)
Albumin: 2 g/dL — ABNORMAL LOW (ref 3.5–5.0)
Alkaline Phosphatase: 136 U/L — ABNORMAL HIGH (ref 38–126)
Anion gap: 4 — ABNORMAL LOW (ref 5–15)
BUN: 22 mg/dL — ABNORMAL HIGH (ref 6–20)
CO2: 24 mmol/L (ref 22–32)
Calcium: 7.9 mg/dL — ABNORMAL LOW (ref 8.9–10.3)
Chloride: 107 mmol/L (ref 101–111)
Creatinine, Ser: 0.92 mg/dL (ref 0.44–1.00)
GFR calc Af Amer: >60 mL/min (ref >60)
GFR calc non Af Amer: 58 mL/min — ABNORMAL LOW (ref >60)
Glucose, Bld: 188 mg/dL — ABNORMAL HIGH (ref 65–99)
Potassium: 4.2 mmol/L (ref 3.5–5.1)
Sodium: 135 mmol/L (ref 135–145)
Total Bilirubin: 1.7 mg/dL — ABNORMAL HIGH (ref 0.3–1.2)
Total Protein: 5.9 g/dL — ABNORMAL LOW (ref 6.5–8.1)

## 2017-03-08 LAB — CBC
HCT: 24.5 % — ABNORMAL LOW (ref 36.0–46.0)
Hemoglobin: 8 g/dL — ABNORMAL LOW (ref 12.0–15.0)
MCH: 30.1 pg (ref 26.0–34.0)
MCHC: 32.7 g/dL (ref 30.0–36.0)
MCV: 92.1 fL (ref 78.0–100.0)
Platelets: 301 10*3/uL (ref 150–400)
RBC: 2.66 MIL/uL — ABNORMAL LOW (ref 3.87–5.11)
RDW: 17.2 % — ABNORMAL HIGH (ref 11.5–15.5)
WBC: 12.6 10*3/uL — ABNORMAL HIGH (ref 4.0–10.5)

## 2017-03-08 LAB — MAGNESIUM: Magnesium: 1.8 mg/dL (ref 1.7–2.4)

## 2017-03-08 LAB — TRIGLYCERIDES: Triglycerides: 61 mg/dL (ref <150)

## 2017-03-08 LAB — DIFFERENTIAL
Basophils Absolute: 0 10*3/uL (ref 0.0–0.1)
Basophils Relative: 0 %
Eosinophils Absolute: 0.3 10*3/uL (ref 0.0–0.7)
Eosinophils Relative: 2 %
Lymphocytes Relative: 13 %
Lymphs Abs: 1.6 10*3/uL (ref 0.7–4.0)
Monocytes Absolute: 1.1 10*3/uL — ABNORMAL HIGH (ref 0.1–1.0)
Monocytes Relative: 9 %
Neutro Abs: 9.5 10*3/uL — ABNORMAL HIGH (ref 1.7–7.7)
Neutrophils Relative %: 76 %

## 2017-03-08 LAB — PHOSPHORUS: Phosphorus: 3.3 mg/dL (ref 2.5–4.6)

## 2017-03-08 LAB — PREALBUMIN: Prealbumin: 11.5 mg/dL — ABNORMAL LOW (ref 18–38)

## 2017-03-08 MED ORDER — FAT EMULSION 20 % IV EMUL
240.0000 mL | INTRAVENOUS | Status: AC
  Administered 2017-03-08: 240 mL via INTRAVENOUS
  Filled 2017-03-08: qty 250

## 2017-03-08 MED ORDER — TRACE MINERALS CR-CU-MN-SE-ZN 10-1000-500-60 MCG/ML IV SOLN
INTRAVENOUS | Status: AC
  Administered 2017-03-08: 17:00:00 via INTRAVENOUS
  Filled 2017-03-08: qty 1920

## 2017-03-08 NOTE — Progress Notes (Signed)
Occupational Therapy Treatment Patient Details Name: Kaylee Mullins MRN: [id number] DOB: 05-28-1936 Today's Date: 03/08/2017    History of present illness Pt is a 81 y/o female who was dishcharged and readmitted following new onset nausea at SNF. Pt is s/p whipple placement from previous admission, and had GJ tube placed this admission. Pt with LLE DVT and L abdomen intramuscular hematoma; lovenox discontinued and supposed to have IV filter placed today or tomorrow. PMH includes anxiety, thyroid cancer, pancreatic mass s/p whipple, DM, HTN, CVA with L sided weakness, and s/p lumbar surgery.    OT comments  Pt making progress with functional goals, continue with acute OT to increase level of function and safety  Follow Up Recommendations  SNF;Supervision/Assistance - 24 hour    Equipment Recommendations  Other (comment) (TBD at next venue of care)    Recommendations for Other Services      Precautions / Restrictions Precautions Precautions: Fall Precaution Comments: (P) GJ tube  Required Braces or Orthoses: Other Brace/Splint Other Brace/Splint: abdominal binder.  Restrictions Weight Bearing Restrictions: No       Mobility Bed Mobility Overal bed mobility: Needs Assistance Bed Mobility: Supine to Sit Rolling: (P) Supervision Sidelying to sit: (P) Supervision Supine to sit: Supervision Sit to supine: (P) Supervision Sit to sidelying: (P) Min guard General bed mobility comments: (P) Min A to bring BLE to EOB and bring hips over with pad.  Transfers Overall transfer level: Needs assistance Equipment used: Rolling walker (2 wheeled);None Transfers: Sit to/from Stand Sit to Stand: Min guard Stand pivot transfers: Min guard Squat pivot transfers: (P) Min guard     General transfer comment: pt perfomred SPT from EOB to [place], then ambulated to recliner with RW    Balance Overall balance assessment: Needs assistance Sitting-balance support: Feet supported;No upper  extremity supported Sitting balance-Leahy Scale: Good Sitting balance - Comments: (P) Able to lean forward to adjust socks   Standing balance support: No upper extremity supported;During functional activity Standing balance-Leahy Scale: Fair Standing balance comment: (P) Able to perform pericare with single LOB due to post lean and requires Min A to correct                           ADL either performed or assessed with clinical judgement   ADL Overall ADL's : Needs assistance/impaired     Grooming: Standing;Min guard                   Toilet Transfer: Min guard;BSC;Stand-pivot;RW   Toileting- Water quality scientist and Hygiene: Min guard;Sit to/from stand       Functional mobility during ADLs: Min guard;Rolling walker General ADL Comments: Pt decreased activity tolerance. Performed toileting and then requested to relax in recliner     Vision Patient Visual Report: No change from baseline                Cognition Arousal/Alertness: Awake/alert Behavior During Therapy: WFL for tasks assessed/performed Overall Cognitive Status: Within Functional Limits for tasks assessed                                                     General Comments  pt pleasant and cooperative    Pertinent Vitals/ Pain       Pain Assessment: 0-10 Faces Pain Scale: (P) Hurts  even more Pain Location: abdomen Pain Descriptors / Indicators: Sore Pain Intervention(s): Limited activity within patient's tolerance;Monitored during session;Repositioned  Home Living                                          Prior Functioning/Environment              Frequency  Min 2X/week        Progress Toward Goals  OT Goals(current goals can now be found in the care plan section)  Progress towards OT goals: Progressing toward goals  Acute Rehab OT Goals Patient Stated Goal: to get better   Plan Discharge plan remains appropriate                      AM-PAC PT "6 Clicks" Daily Activity     Outcome Measure   Help from another person eating meals?: None Help from another person taking care of personal grooming?: A Little Help from another person toileting, which includes using toliet, bedpan, or urinal?: A Little Help from another person bathing (including washing, rinsing, drying)?: A Little Help from another person to put on and taking off regular upper body clothing?: A Little Help from another person to put on and taking off regular lower body clothing?: A Lot 6 Click Score: 18    End of Session Equipment Utilized During Treatment: Rolling walker;Gait belt;Other (comment) (BSC)  OT Visit Diagnosis: Unsteadiness on feet (R26.81);Pain Pain - Right/Left:  (abdomen) Pain - part of body:  (abdomen)   Activity Tolerance Patient tolerated treatment well;Patient limited by fatigue;Patient limited by pain   Patient Left with call bell/phone within reach;in chair   Nurse Communication      Functional Assessment Tool Used: AM-PAC 6 Clicks Daily Activity   Time: 6415-8309 OT Time Calculation (min): 29 min  Charges: OT G-codes **NOT FOR INPATIENT CLASS** Functional Assessment Tool Used: AM-PAC 6 Clicks Daily Activity OT General Charges $OT Visit: 1 Procedure OT Treatments $Self Care/Home Management : 8-22 mins $Therapeutic Activity: 8-22 mins     Britt Bottom 03/08/2017, 12:07 PM

## 2017-03-08 NOTE — Progress Notes (Signed)
26 Days Post-Op   Subjective/Chief Complaint: Continues to feel a little better.  Tolerating small pills. Using less antiemetic.    Objective: Vital signs in last 24 hours: Temp:  [99.1 F (37.3 C)-99.3 F (37.4 C)] 99.1 F (37.3 C) (07/23 0605) Pulse Rate:  [70-82] 82 (07/23 0605) Resp:  [17-19] 19 (07/23 0605) BP: (156-158)/(51-75) 158/75 (07/23 0605) SpO2:  [100 %] 100 % (07/23 0605) Weight:  [100.3 kg (221 lb 1.9 oz)] 100.3 kg (221 lb 1.9 oz) (07/23 0605) Last BM Date: 03/07/17  Intake/Output from previous day: 07/22 0701 - 07/23 0700 In: 0  Out: 1700 [Urine:1300; Drains:400] Intake/Output this shift: Total I/O In: 10 [I.V.:10] Out: -   Alert and cooperative Unlabored respirations Abdomen mildly distended (at baseline), G tube in place to gravity bag Skin is warm and dry  Lab Results:   Recent Labs  03/06/17 0459 03/08/17 0417  WBC 11.4* 12.6*  HGB 8.3* 8.0*  HCT 25.7* 24.5*  PLT 259 301   BMET  Recent Labs  03/08/17 0417  NA 135  K 4.2  CL 107  CO2 24  GLUCOSE 188*  BUN 22*  CREATININE 0.92  CALCIUM 7.9*   PT/INR No results for input(s): LABPROT, INR in the last 72 hours. ABG No results for input(s): PHART, HCO3 in the last 72 hours.  Invalid input(s): PCO2, PO2  Studies/Results: No results found.  Anti-infectives: Anti-infectives    Start     Dose/Rate Route Frequency Ordered Stop   03/05/17 0645  fluconazole (DIFLUCAN) tablet 200 mg     200 mg Oral  Once 03/05/17 0636 03/05/17 0657   02/16/17 1500  ceFAZolin (ANCEF) IVPB 2g/100 mL premix     2 g 200 mL/hr over 30 Minutes Intravenous To Radiology 02/16/17 1447 02/16/17 1613   02/09/17 0600  erythromycin (EES) 400 MG/5ML suspension 200 mg     200 mg Oral Every 6 hours 02/09/17 0147     02/07/17 0000  erythromycin (EES) 400 MG/5ML suspension 400 mg  Status:  Discontinued     400 mg Oral Every 6 hours 02/07/17 1932 02/09/17 0147   02/06/17 0600  erythromycin ethylsuccinate (EES) 200  MG/5ML suspension 400 mg  Status:  Discontinued     400 mg Oral Every 6 hours 02/06/17 0324 02/07/17 1932      Assessment/Plan: Gastroparesis post-Whipple - SIPS and chips - continue TPN for prolonged ileus/protein calorie malnutrition - Con't Gtube port to gravity      -Will plan surgical J tube.  Will see how clinic schedule is Wednesday. Likely vaginal candidiasis       - Diflucan X 1 yesterday, less symptomatic now CRT elevation       - resolved Large L rectus hematoma  improving       - from anticoagulation. Stopped Lovenox.       - Hb stable    LLE DVT       - lovenox held       - IVC filter placed 7/18 PT/OT  Discussed risk of feeding tube placement. Hopefully this week.     LOS: 30 days    Kaylee Mullins 03/08/2017

## 2017-03-08 NOTE — Progress Notes (Signed)
Physical Therapy Treatment Patient Details Name: Kaylee Mullins MRN: [id number] DOB: 10-28-1935 Today's Date: 03/08/2017    History of Present Illness Pt is a 81 y/o female who was dishcharged and readmitted following new onset nausea at SNF. Pt is s/p whipple placement from previous admission, and had GJ tube placed this admission. Pt with LLE DVT and L abdomen intramuscular hematoma; lovenox discontinued and supposed to have IV filter placed today or tomorrow. PMH includes anxiety, thyroid cancer, pancreatic mass s/p whipple, DM, HTN, CVA with L sided weakness, and s/p lumbar surgery.     PT Comments    Pt significantly improved ambulation distance this session and has been walking regularly with mobility tech. 1 seated rest breat required secondary to increased abdominal pain. Pts abd binder was too small and pt had to ambulate with out it. RN notified of pts need for larger size. Patient would benefit from continued skilled PT. Will continue to follow acutely.    Follow Up Recommendations  SNF     Equipment Recommendations  3in1 (PT)    Recommendations for Other Services       Precautions / Restrictions Precautions Precautions: Fall Precaution Comments: GJ tube  Required Braces or Orthoses: Other Brace/Splint Other Brace/Splint: abdominal binder.  Restrictions Weight Bearing Restrictions: No    Mobility  Bed Mobility Overal bed mobility: Needs Assistance Bed Mobility: Supine to Sit     Supine to sit: Supervision Sit to supine: Supervision;HOB elevated   General bed mobility comments: Increased time to progress LE onto bed. HOB elevated for trunk management  Transfers Overall transfer level: Needs assistance Equipment used: Rolling walker (2 wheeled);None Transfers: Sit to/from Stand Sit to Stand: Min guard Stand pivot transfers: Min guard       General transfer comment: pt perfomred SPT from EOB to [facility], then ambulated to recliner with  RW  Ambulation/Gait Ambulation/Gait assistance: Min guard Ambulation Distance (Feet): 200 Feet (x2) Assistive device: Rolling walker (2 wheeled) Gait Pattern/deviations: Step-through pattern;Trunk flexed;Shuffle;Narrow base of support Gait velocity: Decreased Gait velocity interpretation: Below normal speed for age/gender General Gait Details: Noted increased abdominal pain with ambulation. Pts abdominal binder is too small and was unable to don prior to gait training. Cueing to correct narrow BOS, pt uninterested in correcting gait deviations.   Stairs            Wheelchair Mobility    Modified Rankin (Stroke Patients Only)       Balance Overall balance assessment: Needs assistance Sitting-balance support: Feet supported;No upper extremity supported Sitting balance-Leahy Scale: Good Sitting balance - Comments: Able to lean forward to adjust socks   Standing balance support: No upper extremity supported;During functional activity Standing balance-Leahy Scale: Fair Standing balance comment: Able to perform pericare without UE support.                             Cognition Arousal/Alertness: Awake/alert Behavior During Therapy: WFL for tasks assessed/performed Overall Cognitive Status: Within Functional Limits for tasks assessed                                        Exercises      General Comments        Pertinent Vitals/Pain Pain Assessment: 0-10 Faces Pain Scale: Hurts little more Pain Location: abdomen Pain Descriptors / Indicators: Sore Pain Intervention(s): Limited activity within patient's  tolerance;Monitored during session;Repositioned    Home Living                      Prior Function            PT Goals (current goals can now be found in the care plan section) Acute Rehab PT Goals Patient Stated Goal: to get better  PT Goal Formulation: With patient Time For Goal Achievement: 03/10/17 Potential to Achieve  Goals: Fair Progress towards PT goals: Progressing toward goals;PT to reassess next treatment    Frequency    Min 2X/week      PT Plan Current plan remains appropriate    Co-evaluation              AM-PAC PT "6 Clicks" Daily Activity  Outcome Measure  Difficulty turning over in bed (including adjusting bedclothes, sheets and blankets)?: A Lot Difficulty moving from lying on back to sitting on the side of the bed? : Total Difficulty sitting down on and standing up from a chair with arms (e.g., wheelchair, bedside commode, etc,.)?: Total Help needed moving to and from a bed to chair (including a wheelchair)?: A Little Help needed walking in hospital room?: A Little Help needed climbing 3-5 steps with a railing? : A Lot 6 Click Score: 12    End of Session Equipment Utilized During Treatment: Gait belt (abd binder too small, requested larger size ) Activity Tolerance: Patient tolerated treatment well Patient left: with call bell/phone within reach;in bed Nurse Communication: Mobility status (need for larger abd binder) PT Visit Diagnosis: Difficulty in walking, not elsewhere classified (R26.2);Muscle weakness (generalized) (M62.81);Pain Pain - part of body:  (abdomen )     Time: 7408-1448 PT Time Calculation (min) (ACUTE ONLY): 35 min  Charges:  $Gait Training: 8-22 mins $Therapeutic Activity: 8-22 mins                    G Codes:      Benjiman Core, [place] Pager [phone] Acute Rehab   Allena Katz 03/08/2017, 12:12 PM

## 2017-03-08 NOTE — Progress Notes (Signed)
PHARMACY - ADULT TOTAL PARENTERAL NUTRITION CONSULT NOTE   Pharmacy Consult:  TPN Indication: Ileus  Patient Measurements: Height: 5\' 4"  (162.6 cm) Weight: 221 lb 1.9 oz (100.3 kg) IBW/kg (Calculated) : 54.7 TPN AdjBW (KG): 63.5 Body mass index is 37.96 kg/m.  Assessment:  64 YOF with severe malnutrition related to renal cell and pancreatic cancer who presented with abdominal mass on 01/12/17.  She is s/p Whipple procedure on the same day for pancreatic cancer.  She was discharged 02/05/17 and readmitted same day for vomiting at SNF. Pharmacy consulted to manage TPN for ileus that prevented oral intake and caused significant nausea and vomiting.   GI: Gastroparesis s/p Whipple. Decreased abd wall pain. Albumin low at 2.0. Prealbumin low at 11.5. Last BM 7/22. Drain O/P up to 411mL. LBM 7/22. S/p GJ 7/10, malpositioned, to be replaced with surgical FT. Possible J tube placement today but no scheduled surgery yet. Erythromycin for gastroparesis- refusing, PPI IV, PRN Zofran/Phenergan (QTc 455ms on 6/17). Endo: hypothyroid on Synthroid.  DM not on med PTA. CBGs mostly controlled but trending up (100-180s)  Insulin requirements in the past 24 hours: 5 units SSI + 65 units in TPN Lytes:  wnl today. CoCa 9.4. Phos and Mg wnl. Renal: AKI resolved. SCr wnl. BUN 22. UOP not all charted. NS at 7ml/hr Pulm: stable on RA  Cards: HTN - BP up, HR wnl - Norvasc, Toprol, PRN nifedipine XL  Hepatobil:LFTs ok and Tbili up to 1.7. Trig wnl. 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 Afebrile, WBC trending down. Poss elevated 2nd hematoma AC: (+)VTE.  CT (+) large rectus hematoma.  Lovenox stopped 7/17.  IVC filter placed 7/18. Hgb low but stable at 8.0 after transfusion 7/19, Pltc wnl  Best Practices: SCDs  TPN Access: Double lumen PICC 6/19 >> TPN start date: 01/23/17   Nutritional Goals (per RD recommendation on 7/19): 1800-1950 kCal 82-93 g  protein  Current Nutrition: Sips and ice chips   TPN + IV lipid emulsion  Plan:   Continue cyclic Clinimix E 0/96. Infuse 1,920 ml over 16 hours. 22ml/hr x 1 hr, then 168ml/hr, then 85 ml/hr x 1 hr Continue 20% lipid emulsion at 72ml/hr over 12 hrs Continue NS at 58ml/hr TPN and IV lipid emulsion provides 96 g of protein and 1843 kCals per day meeting 100% of protein and kCal needs Add MVI in TPN Add TE in TPN every other day, next 7/23 Add 60 units of regular insulin in TPN Continue moderate SSI Q6h and adjust as needed Monitor TPN labs,  F/U placement of J tube to start TFs  Elenor Quinones, PharmD, [facility] Clinical Pharmacist Pager [phone] 03/08/2017 8:23 AM

## 2017-03-08 NOTE — Care Management (Signed)
Case manager continues to follow patient, spoke with Dr. Concerning treatment plan. Patient to go to OR for Jejunostomy Tube placement, once TNA  can be administered via tube patient will be ready for discharge to SNF. Social worker monitoring.

## 2017-03-09 LAB — GLUCOSE, CAPILLARY
Glucose-Capillary: 122 mg/dL — ABNORMAL HIGH (ref 65–99)
Glucose-Capillary: 147 mg/dL — ABNORMAL HIGH (ref 65–99)
Glucose-Capillary: 154 mg/dL — ABNORMAL HIGH (ref 65–99)
Glucose-Capillary: 185 mg/dL — ABNORMAL HIGH (ref 65–99)
Glucose-Capillary: 232 mg/dL — ABNORMAL HIGH (ref 65–99)

## 2017-03-09 MED ORDER — M.V.I. ADULT IV INJ
INTRAVENOUS | Status: AC
  Administered 2017-03-09: 18:00:00 via INTRAVENOUS
  Filled 2017-03-09: qty 1920

## 2017-03-09 MED ORDER — INSULIN ASPART 100 UNIT/ML ~~LOC~~ SOLN
0.0000 [IU] | SUBCUTANEOUS | Status: DC
  Administered 2017-03-09: 2 [IU] via SUBCUTANEOUS
  Administered 2017-03-09: 3 [IU] via SUBCUTANEOUS
  Administered 2017-03-09 – 2017-03-10 (×2): 2 [IU] via SUBCUTANEOUS
  Administered 2017-03-10: 8 [IU] via SUBCUTANEOUS
  Administered 2017-03-10: 2 [IU] via SUBCUTANEOUS
  Administered 2017-03-11 (×2): 3 [IU] via SUBCUTANEOUS
  Administered 2017-03-11: 11 [IU] via SUBCUTANEOUS
  Administered 2017-03-12 (×2): 3 [IU] via SUBCUTANEOUS
  Administered 2017-03-12: 2 [IU] via SUBCUTANEOUS
  Administered 2017-03-13: 3 [IU] via SUBCUTANEOUS

## 2017-03-09 MED ORDER — FAT EMULSION 20 % IV EMUL
240.0000 mL | INTRAVENOUS | Status: AC
  Administered 2017-03-09: 240 mL via INTRAVENOUS
  Filled 2017-03-09: qty 250

## 2017-03-09 NOTE — Clinical Social Work Note (Signed)
CSW continues to follow for medical stability and discharge needs.  [place], [place]

## 2017-03-09 NOTE — Progress Notes (Signed)
PHARMACY - ADULT TOTAL PARENTERAL NUTRITION CONSULT NOTE   Pharmacy Consult:  TPN Indication: Ileus  Patient Measurements: Height: 5\' 4"  (162.6 cm) Weight: 221 lb 1.9 oz (100.3 kg) IBW/kg (Calculated) : 54.7 TPN AdjBW (KG): 63.5 Body mass index is 37.96 kg/m.  Assessment:  86 YOF with severe malnutrition related to renal cell and pancreatic cancer who presented with abdominal mass on 01/12/17.  She is s/p Whipple procedure on the same day for pancreatic cancer.  She was discharged 02/05/17 and readmitted same day for vomiting at SNF. Pharmacy consulted to manage TPN for ileus that prevented oral intake and caused significant nausea and vomiting.   GI: Gastroparesis s/p Whipple. Decreased abd wall pain. Albumin low at 2.0. Prealbumin low at 11.5. Last BM 7/22. Drain O/P down to 269mL. LBM 7/22. S/p GJ 7/10, malpositioned, to be replaced with surgical FT. Possible J tube placement today but no scheduled surgery yet. Erythromycin for gastroparesis- refusing, PPI IV, PRN Zofran/Phenergan (QTc 460ms on 6/17). Endo: hypothyroid on Synthroid.  DM not on med PTA. CBGs mostly controlled but trending up. (Off TPN 130-140s; on TPN 140-230s) Insulin requirements in the past 24 hours: 5 units SSI + 65 units in TPN Lytes:  wnl today. CoCa 9.4. Phos and Mg wnl. Renal: AKI resolved. SCr wnl. BUN 22. UOP not all charted. NS at 46ml/hr Pulm: stable on RA  Cards: HTN - BP up, HR wnl - Norvasc, Toprol, PRN nifedipine XL  Hepatobil:LFTs ok and Tbili up to 1.7. Trig wnl. 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 Afebrile, WBC trending down. Poss elevated 2nd hematoma AC: (+)VTE.  CT (+) large rectus hematoma.  Lovenox stopped 7/17.  IVC filter placed 7/18. Hgb low but stable at 8.0 after transfusion 7/19, Pltc wnl  Best Practices: SCDs  TPN Access: Double lumen PICC 6/19 >> TPN start date: 01/23/17   Nutritional Goals (per RD recommendation on  7/19): 1800-1950 kCal 82-93 g protein  Current Nutrition: Sips and ice chips   TPN + IV lipid emulsion  Plan:   Continue cyclic Clinimix E 9/56. Infuse 1,920 ml over 16 hours. 47ml/hr x 1 hr, then 142ml/hr, then 85 ml/hr x 1 hr Continue 20% lipid emulsion at 51ml/hr over 12 hrs Continue NS at 12ml/hr TPN and IV lipid emulsion provides 96 g of protein and 1843 kCals per day meeting 100% of protein and kCal needs Add MVI in TPN Add TE in TPN every other day, next 7/25 Add 70 units of regular insulin in TPN Continue moderate SSI with cyclic TPN and adjust as needed Monitor TPN labs  F/U placement of J tube to start TFs possibly on 7/25  Elenor Quinones, PharmD, [facility] Clinical Pharmacist Pager [phone] 03/09/2017 7:36 AM

## 2017-03-09 NOTE — Progress Notes (Signed)
27 Days Post-Op   Subjective/Chief Complaint: Lower abdominal pain.    Objective: Vital signs in last 24 hours: Temp:  [98.5 F (36.9 C)-99.3 F (37.4 C)] 98.5 F (36.9 C) (07/24 0600) Pulse Rate:  [68-73] 68 (07/24 0600) Resp:  [16-18] 16 (07/24 0600) BP: (142-155)/(61-77) 142/76 (07/24 0600) SpO2:  [98 %-100 %] 100 % (07/24 0600) Last BM Date: 03/07/17  Intake/Output from previous day: 07/23 0701 - 07/24 0700 In: 815.5 [I.V.:755.5; IV Piggyback:60] Out: 2000 [Urine:1800; Drains:200] Intake/Output this shift: No intake/output data recorded.  Alert and cooperative Unlabored respirations Abdomen mildly distended (at baseline), G tube in place to gravity bag Skin is warm and dry  Lab Results:   Recent Labs  03/08/17 0417  WBC 12.6*  HGB 8.0*  HCT 24.5*  PLT 301   BMET  Recent Labs  03/08/17 0417  NA 135  K 4.2  CL 107  CO2 24  GLUCOSE 188*  BUN 22*  CREATININE 0.92  CALCIUM 7.9*   PT/INR No results for input(s): LABPROT, INR in the last 72 hours. ABG No results for input(s): PHART, HCO3 in the last 72 hours.  Invalid input(s): PCO2, PO2  Studies/Results: No results found.  Anti-infectives: Anti-infectives    Start     Dose/Rate Route Frequency Ordered Stop   03/05/17 0645  fluconazole (DIFLUCAN) tablet 200 mg     200 mg Oral  Once 03/05/17 0636 03/05/17 0657   02/16/17 1500  ceFAZolin (ANCEF) IVPB 2g/100 mL premix     2 g 200 mL/hr over 30 Minutes Intravenous To Radiology 02/16/17 1447 02/16/17 1613   02/09/17 0600  erythromycin (EES) 400 MG/5ML suspension 200 mg     200 mg Oral Every 6 hours 02/09/17 0147     02/07/17 0000  erythromycin (EES) 400 MG/5ML suspension 400 mg  Status:  Discontinued     400 mg Oral Every 6 hours 02/07/17 1932 02/09/17 0147   02/06/17 0600  erythromycin ethylsuccinate (EES) 200 MG/5ML suspension 400 mg  Status:  Discontinued     400 mg Oral Every 6 hours 02/06/17 0324 02/07/17 1932       Assessment/Plan: Gastroparesis post-Whipple - SIPS and chips - continue TPN for prolonged ileus/protein calorie malnutrition - Con't Gtube port to gravity      -Will plan surgical J tube. Plan tomorrow Large L rectus hematoma  improving       - from anticoagulation. Stopped Lovenox.       - Hb stable    LLE DVT       - lovenox held       - IVC filter placed 7/18  PT/OT  Discussed risk of feeding tube placement. Wednesday or Thursday.     LOS: 31 days    Kaylee Mullins 03/09/2017

## 2017-03-09 NOTE — Progress Notes (Signed)
Initial Nutrition Assessment  DOCUMENTATION CODES:   Non-severe (moderate) malnutrition in context of acute illness/injury, Obesity unspecified  INTERVENTION:   -TPN management per pharmacy - If able to place surgical j-tube, recommend:  Initiate Osmolite 1.5 @ 20 ml/hr via j-tube and increase by 10 ml every 4 hours to goal rate of 50 ml/hr.   30 ml Prostat daily.    Tube feeding regimen provides 1900 kcal (100% of needs), 90 grams of protein, and 914 ml of H2O.   If no IVFs, recommend 150 ml flush every 4 hours.   NUTRITION DIAGNOSIS:   Malnutrition related to chronic illness, acute illness as evidenced by mild depletion of muscle mass, mild depletion of body fat.  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 Had prolonged postop course due to delayed gastric emptying due to gastroparesis and intolerance of TNA, ultimately started on TPN. Represents from SNF after being discharged for less than 12 hours, due to vomiting and abdominal pain.   6/27- s/p endoscopy, which revealed normal esophagus, gastrojejunostmy with healthy appearing mucosa, and stomach with a lot of bilious fluid 6/27- NGT placed 6/30- per [facility] notes, unobstructed outflow from stomach, but no motility (outflow seems gravity dependent) 7/10- GJ tube placed, however, tip of tube in the stomach 7/17- CT revealed large L rectus hematoma 7/18- IVC filter placed  Per RN notes, pt had a BM on 7/22.   Pt remains with g-tube to gravity; noted 200 ml output within the past 24 hours.   Per general surgery notes, plan for surgical j-tube placement this week.   Pt remains TPN dependent. Per pharmacy note on 03/08/17, plan to  continue cyclic Clinimix E 1/15. Infuse 1,920 ml over 16 hours. 51m/hr x 1 hr, then 1229mhr, then 85 ml/hr x 1 hr and continue 20% lipid emulsion at 202mr over 12 hrs. TPN and IV lipid emulsion provides 96 grams of protein and 1843 kcals per day meeting 100% of protein and kCal needs.   Per [facility] notes, plan to d/c to SNF once medically stable.   Labs reviewed: CBGS: 136-232.  Diet Order:  Diet NPO time specified Except for: Sips with Meds TPN (CLINIMIX-E) Adult  Skin:  Wound (see comment) (closed abdominal incision)  Last BM:  03/07/17  Height:   Ht Readings from Last 1 Encounters:  02/06/17 _0  (1.626 m)    Weight:   Wt Readings from Last 1 Encounters:  03/08/17 221 lb 1.9 oz (100.3 kg)    Ideal Body Weight:  54.54 kg  BMI:  Body mass index is 37.96 kg/m.  Estimated Nutritional Needs:   Kcal:  1800-1950 (20-22 kcal/kg bw)  Protein:  82-93g Pro (1.5-1.7g/kg ibw)  Fluid:  1.8-2 L fluid (1 ml/kcal)  EDUCATION NEEDS:   No education needs identified at this time  Gearl Kimbrough A. WilJimmye NormanD, LDN, CDE Pager: [phone] hours Pager: [phone]

## 2017-03-10 ENCOUNTER — Encounter (HOSPITAL_COMMUNITY): Payer: Self-pay | Admitting: Certified Registered Nurse Anesthetist

## 2017-03-10 ENCOUNTER — Inpatient Hospital Stay (HOSPITAL_COMMUNITY): Payer: Medicare Other | Admitting: Certified Registered"

## 2017-03-10 ENCOUNTER — Encounter (HOSPITAL_COMMUNITY)
Admission: EM | Disposition: A | Discharge status: Skilled Nursing Facility | Payer: Self-pay | Source: Home / Self Care | Attending: General Surgery

## 2017-03-10 HISTORY — PX: LAPAROTOMY: SHX154

## 2017-03-10 LAB — GLUCOSE, CAPILLARY
Glucose-Capillary: 139 mg/dL — ABNORMAL HIGH (ref 65–99)
Glucose-Capillary: 129 mg/dL — ABNORMAL HIGH (ref 65–99)
Glucose-Capillary: 122 mg/dL — ABNORMAL HIGH (ref 65–99)
Glucose-Capillary: 179 mg/dL — ABNORMAL HIGH (ref 65–99)
Glucose-Capillary: 267 mg/dL — ABNORMAL HIGH (ref 65–99)

## 2017-03-10 LAB — BASIC METABOLIC PANEL
Anion gap: 3 — ABNORMAL LOW (ref 5–15)
BUN: 16 mg/dL (ref 6–20)
CO2: 25 mmol/L (ref 22–32)
Calcium: 8.2 mg/dL — ABNORMAL LOW (ref 8.9–10.3)
Chloride: 108 mmol/L (ref 101–111)
Creatinine, Ser: 0.76 mg/dL (ref 0.44–1.00)
GFR calc Af Amer: >60 mL/min (ref >60)
GFR calc non Af Amer: >60 mL/min (ref >60)
Glucose, Bld: 140 mg/dL — ABNORMAL HIGH (ref 65–99)
Potassium: 4.1 mmol/L (ref 3.5–5.1)
Sodium: 136 mmol/L (ref 135–145)

## 2017-03-10 LAB — CBC
HCT: 26.3 % — ABNORMAL LOW (ref 36.0–46.0)
Hemoglobin: 8.4 g/dL — ABNORMAL LOW (ref 12.0–15.0)
MCH: 29.6 pg (ref 26.0–34.0)
MCHC: 31.9 g/dL (ref 30.0–36.0)
MCV: 92.6 fL (ref 78.0–100.0)
Platelets: 398 10*3/uL (ref 150–400)
RBC: 2.84 MIL/uL — ABNORMAL LOW (ref 3.87–5.11)
RDW: 16.9 % — ABNORMAL HIGH (ref 11.5–15.5)
WBC: 10 10*3/uL (ref 4.0–10.5)

## 2017-03-10 LAB — PROTIME-INR
INR: 1.22
Prothrombin Time: 15.5 seconds — ABNORMAL HIGH (ref 11.4–15.2)

## 2017-03-10 SURGERY — LAPAROTOMY, EXPLORATORY
Anesthesia: General | Site: Abdomen

## 2017-03-10 MED ORDER — HYDROMORPHONE HCL 1 MG/ML IJ SOLN
0.5000 mg | INTRAMUSCULAR | Status: DC | PRN
  Administered 2017-03-10 (×2): 1 mg via INTRAVENOUS
  Administered 2017-03-10 – 2017-03-11 (×4): 2 mg via INTRAVENOUS
  Administered 2017-03-11: 1 mg via INTRAVENOUS
  Administered 2017-03-11 – 2017-03-12 (×3): 2 mg via INTRAVENOUS
  Administered 2017-03-12 – 2017-03-14 (×14): 1 mg via INTRAVENOUS
  Administered 2017-03-14: 2 mg via INTRAVENOUS
  Administered 2017-03-14 – 2017-03-15 (×2): 1 mg via INTRAVENOUS
  Administered 2017-03-15: 2 mg via INTRAVENOUS
  Administered 2017-03-15: 1 mg via INTRAVENOUS
  Administered 2017-03-15: 2 mg via INTRAVENOUS
  Administered 2017-03-15: 1 mg via INTRAVENOUS
  Administered 2017-03-16: 2 mg via INTRAVENOUS
  Administered 2017-03-16: 1 mg via INTRAVENOUS
  Administered 2017-03-16 (×3): 2 mg via INTRAVENOUS
  Administered 2017-03-17: 1 mg via INTRAVENOUS
  Administered 2017-03-17 (×2): 2 mg via INTRAVENOUS
  Administered 2017-03-17: 1 mg via INTRAVENOUS
  Administered 2017-03-18 – 2017-03-19 (×11): 2 mg via INTRAVENOUS
  Administered 2017-03-20: 1 mg via INTRAVENOUS
  Administered 2017-03-20 (×2): 2 mg via INTRAVENOUS
  Administered 2017-03-20 – 2017-03-21 (×5): 1 mg via INTRAVENOUS
  Administered 2017-03-21: 2 mg via INTRAVENOUS
  Administered 2017-03-21 (×4): 1 mg via INTRAVENOUS
  Filled 2017-03-10 (×5): qty 1
  Filled 2017-03-10 (×3): qty 2
  Filled 2017-03-10 (×6): qty 1
  Filled 2017-03-10: qty 2
  Filled 2017-03-10 (×2): qty 1
  Filled 2017-03-10 (×3): qty 2
  Filled 2017-03-10 (×2): qty 1
  Filled 2017-03-10: qty 2
  Filled 2017-03-10: qty 1
  Filled 2017-03-10: qty 2
  Filled 2017-03-10 (×3): qty 1
  Filled 2017-03-10 (×2): qty 2
  Filled 2017-03-10 (×2): qty 1
  Filled 2017-03-10: qty 2
  Filled 2017-03-10 (×2): qty 1
  Filled 2017-03-10 (×6): qty 2
  Filled 2017-03-10: qty 1
  Filled 2017-03-10 (×3): qty 2
  Filled 2017-03-10 (×3): qty 1
  Filled 2017-03-10 (×2): qty 2
  Filled 2017-03-10: qty 1
  Filled 2017-03-10: qty 2
  Filled 2017-03-10: qty 1
  Filled 2017-03-10 (×2): qty 2
  Filled 2017-03-10: qty 1
  Filled 2017-03-10 (×3): qty 2
  Filled 2017-03-10: qty 1
  Filled 2017-03-10 (×2): qty 2
  Filled 2017-03-10: qty 1
  Filled 2017-03-10: qty 2
  Filled 2017-03-10: qty 1
  Filled 2017-03-10: qty 2
  Filled 2017-03-10: qty 1

## 2017-03-10 MED ORDER — ROCURONIUM BROMIDE 10 MG/ML (PF) SYRINGE
PREFILLED_SYRINGE | INTRAVENOUS | Status: AC
  Filled 2017-03-10: qty 5

## 2017-03-10 MED ORDER — FENTANYL CITRATE (PF) 100 MCG/2ML IJ SOLN
INTRAMUSCULAR | Status: AC
  Filled 2017-03-10: qty 2

## 2017-03-10 MED ORDER — CEFAZOLIN SODIUM-DEXTROSE 2-3 GM-% IV SOLR
INTRAVENOUS | Status: DC | PRN
  Administered 2017-03-10: 2 g via INTRAVENOUS

## 2017-03-10 MED ORDER — 0.9 % SODIUM CHLORIDE (POUR BTL) OPTIME
TOPICAL | Status: DC | PRN
  Administered 2017-03-10: 2000 mL

## 2017-03-10 MED ORDER — LABETALOL HCL 5 MG/ML IV SOLN
INTRAVENOUS | Status: AC
  Filled 2017-03-10: qty 4

## 2017-03-10 MED ORDER — LIDOCAINE 2% (20 MG/ML) 5 ML SYRINGE
INTRAMUSCULAR | Status: AC
  Filled 2017-03-10: qty 5

## 2017-03-10 MED ORDER — FENTANYL CITRATE (PF) 100 MCG/2ML IJ SOLN
25.0000 ug | INTRAMUSCULAR | Status: DC | PRN
  Administered 2017-03-10 (×3): 50 ug via INTRAVENOUS

## 2017-03-10 MED ORDER — LABETALOL HCL 5 MG/ML IV SOLN
5.0000 mg | Freq: Once | INTRAVENOUS | Status: AC
  Administered 2017-03-10: 5 mg via INTRAVENOUS

## 2017-03-10 MED ORDER — MIDAZOLAM HCL 2 MG/2ML IJ SOLN
INTRAMUSCULAR | Status: AC
  Filled 2017-03-10: qty 2

## 2017-03-10 MED ORDER — FENTANYL CITRATE (PF) 250 MCG/5ML IJ SOLN
INTRAMUSCULAR | Status: AC
  Filled 2017-03-10: qty 5

## 2017-03-10 MED ORDER — TRACE MINERALS CR-CU-MN-SE-ZN 10-1000-500-60 MCG/ML IV SOLN
INTRAVENOUS | Status: AC
  Administered 2017-03-10: 20:00:00 via INTRAVENOUS
  Filled 2017-03-10: qty 1920

## 2017-03-10 MED ORDER — HYDROMORPHONE HCL 1 MG/ML IJ SOLN
INTRAMUSCULAR | Status: AC
  Filled 2017-03-10: qty 1

## 2017-03-10 MED ORDER — ONDANSETRON HCL 4 MG/2ML IJ SOLN
INTRAMUSCULAR | Status: AC
  Filled 2017-03-10: qty 2

## 2017-03-10 MED ORDER — DEXAMETHASONE SODIUM PHOSPHATE 10 MG/ML IJ SOLN
INTRAMUSCULAR | Status: DC | PRN
  Administered 2017-03-10: 10 mg via INTRAVENOUS

## 2017-03-10 MED ORDER — FENTANYL CITRATE (PF) 100 MCG/2ML IJ SOLN
INTRAMUSCULAR | Status: DC | PRN
  Administered 2017-03-10 (×2): 100 ug via INTRAVENOUS
  Administered 2017-03-10: 50 ug via INTRAVENOUS

## 2017-03-10 MED ORDER — DEXAMETHASONE SODIUM PHOSPHATE 10 MG/ML IJ SOLN
INTRAMUSCULAR | Status: AC
  Filled 2017-03-10: qty 1

## 2017-03-10 MED ORDER — ONDANSETRON HCL 4 MG/2ML IJ SOLN
INTRAMUSCULAR | Status: DC | PRN
  Administered 2017-03-10: 4 mg via INTRAVENOUS

## 2017-03-10 MED ORDER — FAT EMULSION 20 % IV EMUL
240.0000 mL | INTRAVENOUS | Status: AC
  Filled 2017-03-10: qty 250

## 2017-03-10 MED ORDER — ROCURONIUM BROMIDE 10 MG/ML (PF) SYRINGE
PREFILLED_SYRINGE | INTRAVENOUS | Status: DC | PRN
  Administered 2017-03-10: 10 mg via INTRAVENOUS
  Administered 2017-03-10: 40 mg via INTRAVENOUS

## 2017-03-10 MED ORDER — PROPOFOL 10 MG/ML IV BOLUS
INTRAVENOUS | Status: DC | PRN
  Administered 2017-03-10: 120 mg via INTRAVENOUS

## 2017-03-10 MED ORDER — PROPOFOL 10 MG/ML IV BOLUS
INTRAVENOUS | Status: AC
  Filled 2017-03-10: qty 20

## 2017-03-10 MED ORDER — LIDOCAINE 2% (20 MG/ML) 5 ML SYRINGE
INTRAMUSCULAR | Status: DC | PRN
  Administered 2017-03-10: 60 mg via INTRAVENOUS

## 2017-03-10 MED ORDER — LACTATED RINGERS IV SOLN
INTRAVENOUS | Status: DC | PRN
  Administered 2017-03-10: 16:00:00 via INTRAVENOUS

## 2017-03-10 MED ORDER — BUPIVACAINE-EPINEPHRINE (PF) 0.25% -1:200000 IJ SOLN
INTRAMUSCULAR | Status: AC
  Filled 2017-03-10: qty 60

## 2017-03-10 MED ORDER — FENTANYL CITRATE (PF) 100 MCG/2ML IJ SOLN
INTRAMUSCULAR | Status: AC
  Administered 2017-03-10: 50 ug via INTRAVENOUS
  Filled 2017-03-10: qty 2

## 2017-03-10 MED ORDER — SUGAMMADEX SODIUM 200 MG/2ML IV SOLN
INTRAVENOUS | Status: AC
Start: 2017-03-10 — End: ?
  Filled 2017-03-10: qty 2

## 2017-03-10 SURGICAL SUPPLY — 46 items
BLADE CLIPPER SURG (BLADE) IMPLANT
CANISTER SUCT 3000ML PPV (MISCELLANEOUS) ×3 IMPLANT
CHLORAPREP W/TINT 26ML (MISCELLANEOUS) ×3 IMPLANT
COVER SURGICAL LIGHT HANDLE (MISCELLANEOUS) ×3 IMPLANT
DRAPE LAPAROSCOPIC ABDOMINAL (DRAPES) ×3 IMPLANT
DRAPE WARM FLUID 44X44 (DRAPE) ×3 IMPLANT
DRSG COVADERM 4X8 (GAUZE/BANDAGES/DRESSINGS) ×3 IMPLANT
DRSG OPSITE POSTOP 4X10 (GAUZE/BANDAGES/DRESSINGS) IMPLANT
DRSG OPSITE POSTOP 4X8 (GAUZE/BANDAGES/DRESSINGS) IMPLANT
ELECT BLADE 6.5 EXT (BLADE) ×3 IMPLANT
ELECT CAUTERY BLADE 6.4 (BLADE) ×3 IMPLANT
ELECT REM PT RETURN 9FT ADLT (ELECTROSURGICAL) ×3
ELECTRODE REM PT RTRN 9FT ADLT (ELECTROSURGICAL) ×1 IMPLANT
GAUZE SPONGE 4X4 12PLY STRL (GAUZE/BANDAGES/DRESSINGS) ×3 IMPLANT
GLOVE BIO SURGEON STRL SZ 6 (GLOVE) ×3 IMPLANT
GLOVE BIO SURGEON STRL SZ7.5 (GLOVE) ×3 IMPLANT
GLOVE BIOGEL PI IND STRL 6.5 (GLOVE) ×1 IMPLANT
GLOVE BIOGEL PI IND STRL 7.5 (GLOVE) ×1 IMPLANT
GLOVE BIOGEL PI INDICATOR 6.5 (GLOVE) ×2
GLOVE BIOGEL PI INDICATOR 7.5 (GLOVE) ×2
GOWN STRL REUS W/ TWL LRG LVL3 (GOWN DISPOSABLE) ×1 IMPLANT
GOWN STRL REUS W/TWL 2XL LVL3 (GOWN DISPOSABLE) ×3 IMPLANT
GOWN STRL REUS W/TWL LRG LVL3 (GOWN DISPOSABLE) ×2
KIT BASIN OR (CUSTOM PROCEDURE TRAY) ×3 IMPLANT
KIT ROOM TURNOVER OR (KITS) ×3 IMPLANT
LIGASURE IMPACT 36 18CM CVD LR (INSTRUMENTS) IMPLANT
NS IRRIG 1000ML POUR BTL (IV SOLUTION) ×6 IMPLANT
PACK GENERAL/GYN (CUSTOM PROCEDURE TRAY) ×3 IMPLANT
PAD ARMBOARD 7.5X6 YLW CONV (MISCELLANEOUS) ×3 IMPLANT
SPECIMEN JAR LARGE (MISCELLANEOUS) IMPLANT
SPONGE LAP 18X18 X RAY DECT (DISPOSABLE) ×6 IMPLANT
STAPLER VISISTAT 35W (STAPLE) ×3 IMPLANT
SUCTION POOLE TIP (SUCTIONS) ×3 IMPLANT
SUT ETHILON 2 0 FS 18 (SUTURE) ×3 IMPLANT
SUT PDS II 0 TP-1 LOOPED 60 (SUTURE) ×9 IMPLANT
SUT VIC AB 2-0 SH 18 (SUTURE) ×3 IMPLANT
SUT VIC AB 3-0 SH 18 (SUTURE) ×3 IMPLANT
SUT VICRYL 4-0 PS2 18IN ABS (SUTURE) IMPLANT
SUT VICRYL AB 2 0 TIES (SUTURE) ×3 IMPLANT
SUT VICRYL AB 3 0 TIES (SUTURE) ×3 IMPLANT
SYR 20ML ECCENTRIC (SYRINGE) ×3 IMPLANT
TOWEL OR 17X24 6PK STRL BLUE (TOWEL DISPOSABLE) ×3 IMPLANT
TOWEL OR 17X26 10 PK STRL BLUE (TOWEL DISPOSABLE) ×3 IMPLANT
TRAY FOLEY W/METER SILVER 16FR (SET/KITS/TRAYS/PACK) ×3 IMPLANT
TUBE J 18FR (TUBING) ×3 IMPLANT
YANKAUER SUCT BULB TIP NO VENT (SUCTIONS) ×3 IMPLANT

## 2017-03-10 NOTE — Progress Notes (Signed)
PHARMACY - ADULT TOTAL PARENTERAL NUTRITION CONSULT NOTE   Pharmacy Consult:  TPN Indication: Ileus  Patient Measurements: Height: 5\' 4"  (162.6 cm) Weight: 221 lb 1.9 oz (100.3 kg) IBW/kg (Calculated) : 54.7 TPN AdjBW (KG): 63.5 Body mass index is 37.96 kg/m.  Assessment:  37 YOF with severe malnutrition related to renal cell and pancreatic cancer who presented with abdominal mass on 01/12/17.  She is s/p Whipple procedure on the same day for pancreatic cancer.  She was discharged 02/05/17 and readmitted same day for vomiting at SNF. Pharmacy consulted to manage TPN for ileus that prevented oral intake and caused significant nausea and vomiting.   GI: Gastroparesis s/p Whipple. Decreased abd wall pain. Albumin low at 2.0. Prealbumin low at 11.5. Last BM 7/23. No Drain O/P charted yesterday. S/p GJ 7/10, malpositioned, to be replaced with surgical FT. J tube placement today. Erythromycin for gastroparesis- refusing, PPI IV, PRN Zofran/Phenergan (QTc 444ms on 6/17). Endo: hypothyroid on Synthroid.  DM not on med PTA. CBGs controlled. (Off TPN 120-140s; on TPN 140-180s) Insulin requirements in the past 24 hours: 12 units SSI + 70 units in TPN Lytes:  wnl today. CoCa 9.7. Phos and Mg wnl. Renal: AKI resolved. SCr wnl. BUN 22. UOP 1.20ml/kg/hr. NS at 38ml/hr Pulm: stable on RA  Cards: HTN - BP up, HR wnl - Norvasc, Toprol, PRN nifedipine XL  Hepatobil:LFTs ok and Tbili up to 1.7. Trig wnl. 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 Afebrile, WBC trending down. Poss elevated 2nd hematoma AC: (+)VTE.  CT (+) large rectus hematoma.  Lovenox stopped 7/17.  IVC filter placed 7/18. Hgb low but stable at 8.0 after transfusion 7/19, Pltc wnl  Best Practices: SCDs  TPN Access: Double lumen PICC 6/19 >> TPN start date: 01/23/17   Nutritional Goals (per RD recommendation on 7/24): 1800-1950 kCal 82-93 g protein  Current Nutrition: Sips and  ice chips   TPN + IV lipid emulsion  Plan:   Continue cyclic Clinimix E 7/49. Infuse 1,920 ml over 16 hours. 5ml/hr x 1 hr, then 140ml/hr for 14 hrs, then 85 ml/hr x 1 hr Continue 20% lipid emulsion at 53ml/hr over 12 hrs Continue NS at 30ml/hr TPN and IV lipid emulsion provides 96 g of protein and 1843 kCals per day meeting 100% of protein and kCal needs Add MVI in TPN Add TE in TPN every other day, next 7/25 Add 70 units of regular insulin in TPN Continue moderate SSI with cyclic TPN and adjust as needed Monitor TPN labs  F/U placement of J tube and inititiation of TFs   Elenor Quinones, PharmD, BCPS Clinical Pharmacist Pager [phone] 03/10/2017 8:23 AM

## 2017-03-10 NOTE — Interval H&P Note (Signed)
History and Physical Interval Note:  03/10/2017 3:39 PM  Kaylee Mullins  has presented today for surgery, with the diagnosis of GASTIC OUTLET OBSTRUCTION  The various methods of treatment have been discussed with the patient and family. After consideration of risks, benefits and other options for treatment, the patient has consented to  Procedure(s): EXPLORATORY LAPAROTOMY (N/A) as a surgical intervention .  The patient's history has been reviewed, patient examined, no change in status, stable for surgery.  I have reviewed the patient's chart and labs.  Questions were answered to the patient's satisfaction.     [name]

## 2017-03-10 NOTE — Anesthesia Procedure Notes (Signed)
Procedure Name: Intubation Date/Time: 03/10/2017 3:59 PM Performed by: Trixie Deis A Pre-anesthesia Checklist: Patient identified, Emergency Drugs available, Suction available and Patient being monitored Patient Re-evaluated:Patient Re-evaluated prior to induction Oxygen Delivery Method: Circle System Utilized Preoxygenation: Pre-oxygenation with 100% oxygen Induction Type: IV induction, Cricoid Pressure applied and Rapid sequence Laryngoscope Size: Mac and 3 Grade View: Grade I Tube type: Oral Tube size: 7.5 mm Number of attempts: 1 Airway Equipment and Method: Stylet and Oral airway Placement Confirmation: ETT inserted through vocal cords under direct vision,  positive ETCO2 and breath sounds checked- equal and bilateral Secured at: 21 cm Tube secured with: Tape Dental Injury: Teeth and Oropharynx as per pre-operative assessment

## 2017-03-10 NOTE — H&P (View-Only) (Signed)
27 Days Post-Op   Subjective/Chief Complaint: Lower abdominal pain.    Objective: Vital signs in last 24 hours: Temp:  [98.5 F (36.9 C)-99.3 F (37.4 C)] 98.5 F (36.9 C) (07/24 0600) Pulse Rate:  [68-73] 68 (07/24 0600) Resp:  [16-18] 16 (07/24 0600) BP: (142-155)/(61-77) 142/76 (07/24 0600) SpO2:  [98 %-100 %] 100 % (07/24 0600) Last BM Date: 03/07/17  Intake/Output from previous day: 07/23 0701 - 07/24 0700 In: 815.5 [I.V.:755.5; IV Piggyback:60] Out: 2000 [Urine:1800; Drains:200] Intake/Output this shift: No intake/output data recorded.  Alert and cooperative Unlabored respirations Abdomen mildly distended (at baseline), G tube in place to gravity bag Skin is warm and dry  Lab Results:   Recent Labs  03/08/17 0417  WBC 12.6*  HGB 8.0*  HCT 24.5*  PLT 301   BMET  Recent Labs  03/08/17 0417  NA 135  K 4.2  CL 107  CO2 24  GLUCOSE 188*  BUN 22*  CREATININE 0.92  CALCIUM 7.9*   PT/INR No results for input(s): LABPROT, INR in the last 72 hours. ABG No results for input(s): PHART, HCO3 in the last 72 hours.  Invalid input(s): PCO2, PO2  Studies/Results: No results found.  Anti-infectives: Anti-infectives    Start     Dose/Rate Route Frequency Ordered Stop   03/05/17 0645  fluconazole (DIFLUCAN) tablet 200 mg     200 mg Oral  Once 03/05/17 0636 03/05/17 0657   02/16/17 1500  ceFAZolin (ANCEF) IVPB 2g/100 mL premix     2 g 200 mL/hr over 30 Minutes Intravenous To Radiology 02/16/17 1447 02/16/17 1613   02/09/17 0600  erythromycin (EES) 400 MG/5ML suspension 200 mg     200 mg Oral Every 6 hours 02/09/17 0147     02/07/17 0000  erythromycin (EES) 400 MG/5ML suspension 400 mg  Status:  Discontinued     400 mg Oral Every 6 hours 02/07/17 1932 02/09/17 0147   02/06/17 0600  erythromycin ethylsuccinate (EES) 200 MG/5ML suspension 400 mg  Status:  Discontinued     400 mg Oral Every 6 hours 02/06/17 0324 02/07/17 1932       Assessment/Plan: Gastroparesis post-Whipple - SIPS and chips - continue TPN for prolonged ileus/protein calorie malnutrition - Con't Gtube port to gravity      -Will plan surgical J tube. Plan tomorrow Large L rectus hematoma  improving       - from anticoagulation. Stopped Lovenox.       - Hb stable    LLE DVT       - lovenox held       - IVC filter placed 7/18  PT/OT  Discussed risk of feeding tube placement. Wednesday or Thursday.     LOS: 31 days    Kaylee Mullins 03/09/2017

## 2017-03-10 NOTE — Progress Notes (Addendum)
Patient has GJ tube in the left upper quadrant which has 2 ports.  BOTH OF THE OTHER PORTS ARE IN THE STOMACH. ONE IS LABELED JEJUNAL PORT, BUT IS IN THE STOMACH.  The gastric port should stay to gravity other than when the patient is ambulating/bathing.  It can be clamped for 30 min-1 hour.    The J tube is in the left MID abdomen and has a balloon port and a feeding jejunal port.    J tube feeding should only be in the LOWER TUBE.  The J tube should be flushed 4 times per day.  DO NOT UNDER ANY CIRCUMSTANCES CRUSH ANY MEDICATIONS AND PUT INTO THE LOWER TUBE!!!!!!!!!

## 2017-03-10 NOTE — Op Note (Addendum)
PRE-OPERATIVE DIAGNOSIS: gastroparesis, malnutrition s/p whipple for ampullary cancer  POST-OPERATIVE DIAGNOSIS:  Same  PROCEDURE:  Procedure(s): Jejunostomy tube  SURGEON:  Surgeon(s): Stark Klein, MD  ASSISTANT: Sharyn Dross, RNFA  ANESTHESIA:   general  DRAINS: Jejunostomy Tube, 18 Fr J tube   LOCAL MEDICATIONS USED:  Marcaine 0.25% with epinephrine  SPECIMEN:  No Specimen  DISPOSITION OF SPECIMEN:  N/A  COUNTS:  YES  DICTATION: .Dragon Dictation  PLAN OF CARE: back to regular room after PACU   PATIENT DISPOSITION:  PACU - hemodynamically stable.  FINDINGS:  Normal caliber jejunum  EBL: 50 mL  PROCEDURE:  Patient was identified in the holding area and taken to the operating room where she was placed supine on operating room table. General endotracheal anesthesia was induced. Her abdomen was then prepped and draped in sterile fashion. Timeout was performed according to the surgical safety checklist. When all was correct, we continued.  The lower portion of the patient's midline incision was opened with a #10 blade. This was carried to just below the umbilicus. The subcutaneous tissues were divided with the cautery. The fascia was elevated using two Kocher clamps. This was incised sharply. There was a loop of small bowel densely adherent to the posterior abdominal wall.  This was taken down sharply with scissors.  The bowel had a small segment of deserosalization that was oversewn with 3-0 silk.  The stomach, colon, and omentum were pulled up to the upper abdominal wall from the G tube that had previously been placed.    A 3-0 silk pursestring suture was placed in the small intestine on the antimesenteric border. An appropriate location for a J-tube was located in the left upper abdomen. A incision was made and a tonsil was used to pull the J-tube through the abdominal wall. The jejunum was opened and the tube was threaded distally in the jejunum.    Witzel sutures were  placed proximally on the jejunum.  The tube was then pexed to the abdominal wall with 2-0 silk sutures.  The tube was flushed with saline.  The jejunum was pexed distally from the J tube to the abdominal wall to minimize volvulus.  The abdomen was then irrigated.  The fascia was closed with 0-0 looped PDS x 2. Local anesthetic was infiltrated into the abdominal incision.     The skin was irrigated and closed with staples.   The skin was cleaned, dried, and dressed with a soft sterile dressing.    The patient was allowed to emerge from anesthesia and was taken to the PACU in stable condition.  Needle, sponge, and instrument counts were correct x 2.

## 2017-03-10 NOTE — Anesthesia Preprocedure Evaluation (Addendum)
Anesthesia Evaluation  Patient identified by MRN, date of birth, ID band Patient awake    Reviewed: Allergy & Precautions, H&P , NPO status , Patient's Chart, lab work & pertinent test results, reviewed documented beta blocker date and time   Airway Mallampati: II  TM Distance: >3 FB     Dental no notable dental hx.    Pulmonary neg pulmonary ROS, pneumonia,    Pulmonary exam normal breath sounds clear to auscultation       Cardiovascular hypertension, Pt. on medications and Pt. on home beta blockers  Rhythm:Regular Rate:Normal     Neuro/Psych Anxiety Depression CVA, Residual Symptoms    GI/Hepatic negative GI ROS, Neg liver ROS,   Endo/Other  diabetes, Insulin DependentHypothyroidism Morbid obesity  Renal/GU Renal disease  negative genitourinary   Musculoskeletal  (+) Arthritis ,   Abdominal   Peds  Hematology negative hematology ROS (+)   Anesthesia Other Findings   Reproductive/Obstetrics negative OB ROS                            Anesthesia Physical Anesthesia Plan  ASA: III  Anesthesia Plan: General   Post-op Pain Management:    Induction: Intravenous  PONV Risk Score and Plan:   Airway Management Planned: Oral ETT  Additional Equipment:   Intra-op Plan:   Post-operative Plan: Extubation in OR  Informed Consent: I have reviewed the patients History and Physical, chart, labs and discussed the procedure including the risks, benefits and alternatives for the proposed anesthesia with the patient or authorized representative who has indicated his/her understanding and acceptance.   Dental advisory given  Plan Discussed with: CRNA and Anesthesiologist  Anesthesia Plan Comments:        Anesthesia Quick Evaluation

## 2017-03-10 NOTE — Transfer of Care (Signed)
Immediate Anesthesia Transfer of Care Note  Patient: Kaylee Mullins  Procedure(s) Performed: Procedure(s): EXPLORATORY LAPAROTOMY Open jejunostomy tube (N/A)  Patient Location: PACU  Anesthesia Type:General  Level of Consciousness: awake, alert  and oriented  Airway & Oxygen Therapy: Patient Spontanous Breathing and Patient connected to nasal cannula oxygen  Post-op Assessment: Report given to RN, Post -op Vital signs reviewed and stable and Patient moving all extremities  Post vital signs: Reviewed and stable  Last Vitals:  Vitals:   03/10/17 1355 03/10/17 1728  BP: (!) 155/62 (!) 163/74  Pulse: 72 82  Resp: 18 (!) 24  Temp: 37.1 C 37.3 C    Last Pain:  Vitals:   03/10/17 1355  TempSrc: Oral  PainSc:       Patients Stated Pain Goal: 0 (18/29/93 7169)  Complications: No apparent anesthesia complications

## 2017-03-11 ENCOUNTER — Encounter (HOSPITAL_COMMUNITY): Payer: Self-pay | Admitting: General Surgery

## 2017-03-11 LAB — COMPREHENSIVE METABOLIC PANEL
ALT: 25 U/L (ref 14–54)
AST: 19 U/L (ref 15–41)
Albumin: 1.8 g/dL — ABNORMAL LOW (ref 3.5–5.0)
Alkaline Phosphatase: 93 U/L (ref 38–126)
Anion gap: 4 — ABNORMAL LOW (ref 5–15)
BUN: 17 mg/dL (ref 6–20)
CO2: 21 mmol/L — ABNORMAL LOW (ref 22–32)
Calcium: 6.9 mg/dL — ABNORMAL LOW (ref 8.9–10.3)
Chloride: 111 mmol/L (ref 101–111)
Creatinine, Ser: 0.79 mg/dL (ref 0.44–1.00)
GFR calc Af Amer: >60 mL/min (ref >60)
GFR calc non Af Amer: >60 mL/min (ref >60)
Glucose, Bld: 278 mg/dL — ABNORMAL HIGH (ref 65–99)
Potassium: 4 mmol/L (ref 3.5–5.1)
Sodium: 136 mmol/L (ref 135–145)
Total Bilirubin: 0.9 mg/dL (ref 0.3–1.2)
Total Protein: 5.5 g/dL — ABNORMAL LOW (ref 6.5–8.1)

## 2017-03-11 LAB — TYPE AND SCREEN
ABO/RH(D): B POS
Antibody Screen: NEGATIVE

## 2017-03-11 LAB — PHOSPHORUS: Phosphorus: 2.9 mg/dL (ref 2.5–4.6)

## 2017-03-11 LAB — GLUCOSE, CAPILLARY
Glucose-Capillary: 304 mg/dL — ABNORMAL HIGH (ref 65–99)
Glucose-Capillary: 178 mg/dL — ABNORMAL HIGH (ref 65–99)
Glucose-Capillary: 118 mg/dL — ABNORMAL HIGH (ref 65–99)
Glucose-Capillary: 184 mg/dL — ABNORMAL HIGH (ref 65–99)
Glucose-Capillary: 187 mg/dL — ABNORMAL HIGH (ref 65–99)

## 2017-03-11 LAB — MAGNESIUM: Magnesium: 1.5 mg/dL — ABNORMAL LOW (ref 1.7–2.4)

## 2017-03-11 MED ORDER — FAT EMULSION 20 % IV EMUL
240.0000 mL | INTRAVENOUS | Status: AC
  Administered 2017-03-11: 240 mL via INTRAVENOUS
  Filled 2017-03-11: qty 250

## 2017-03-11 MED ORDER — FAT EMULSION 20 % IV EMUL: 240.0000 mL | INTRAVENOUS | Status: DC

## 2017-03-11 MED ORDER — M.V.I. ADULT IV INJ: INTRAVENOUS | Status: DC

## 2017-03-11 MED ORDER — M.V.I. ADULT IV INJ
INTRAVENOUS | Status: AC
  Administered 2017-03-11: 18:00:00 via INTRAVENOUS
  Filled 2017-03-11: qty 1920

## 2017-03-11 MED ORDER — MAGNESIUM SULFATE 2 GM/50ML IV SOLN
2.0000 g | Freq: Once | INTRAVENOUS | Status: AC
  Administered 2017-03-11: 2 g via INTRAVENOUS
  Filled 2017-03-11: qty 50

## 2017-03-11 MED ORDER — JEVITY 1.2 CAL PO LIQD
1000.0000 mL | ORAL | Status: DC
Start: 2017-03-11 — End: 2017-03-11

## 2017-03-11 MED ORDER — PRO-STAT SUGAR FREE PO LIQD
30.0000 mL | Freq: Every day | ORAL | Status: DC
  Administered 2017-03-11 – 2017-03-23 (×11): 30 mL via JEJUNOSTOMY
  Filled 2017-03-11 (×12): qty 30

## 2017-03-11 MED ORDER — OSMOLITE 1.5 CAL PO LIQD
1000.0000 mL | ORAL | Status: DC
Start: 2017-03-11 — End: 2017-03-17
  Administered 2017-03-11 – 2017-03-14 (×2): 1000 mL via JEJUNOSTOMY
  Filled 2017-03-11 (×4): qty 1000

## 2017-03-11 NOTE — Progress Notes (Signed)
PHARMACY - ADULT TOTAL PARENTERAL NUTRITION CONSULT NOTE   Pharmacy Consult:  TPN Indication: Ileus  Patient Measurements: Height: 5\' 4"  (162.6 cm) Weight: 221 lb (100.2 kg) IBW/kg (Calculated) : 54.7 TPN AdjBW (KG): 66.1 Body mass index is 37.93 kg/m.  Assessment:  20 YOF with severe malnutrition related to renal cell and pancreatic cancer who presented with abdominal mass on 01/12/17.  She is s/p Whipple procedure on the same day for pancreatic cancer.  She was discharged 02/05/17 and readmitted same day for vomiting at SNF. Pharmacy consulted to manage TPN for ileus that prevented oral intake and caused significant nausea and vomiting.   GI: Gastroparesis s/p Whipple. Per patient, feels much better today and having decreased abd wall pain. She states small amount of flatus and smear on 7/25, none today so far.  Last BM 7/23. Albumin low at 1.8. Prealbumin low at 11.5 on 7/22. 75 mL Drain O/P charted yesterday. S/p GJ 7/10, malpositioned, to be replaced with surgical FT. J tube placed 7/25. Plan to start tube feeds 7/26 via J tube. If tolerates, will begin to wean TPN on Saturday per Surgery. -Erythromycin for gastroparesis- patient refusing, PPI IV, PRN Zofran/Phenergan (QTc 478ms on 6/17). Endo: hypothyroid on Synthroid.  DM not on med PTA. CBGs uncontrolled. (Off TPN 122-179; on TPN 267-304). Received 10mg  of Dexamethasone 7/25 at 16:26PM which is likely reason for increased CBGs overnight.   Insulin requirements in the past 24 hours: 21 units SSI + 70 units in TPN Lytes: K wnl. Mg low today at 1.5 s/p OR. CoCa 8.7. Phos wnl. Renal: AKI resolved. SCr wnl. BUN down 17. UOP 0.18ml/kg/hr. NS at 53ml/hr. Pulm: stable on RA  Cards: HTN - BP up, HR wnl - Norvasc, Toprol, PRN nifedipine XL  Hepatobil:LFTs wnl and Tbili back down to wnl. Trig wnl on 7/22. 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 Afebrile, WBC wnl. AC:  (+)VTE.  CT (+) large rectus hematoma.  Lovenox stopped 7/17.  IVC filter placed 7/18. Hgb low but stable at 8.4(last transfusion 7/19), Pltc wnl  Best Practices: SCDs  TPN Access: Double lumen PICC 6/19 >> TPN start date: 01/23/17   Nutritional Goals (per RD recommendation on 7/24): 1800-1950 kCal 82-93 g protein  Current Nutrition: Sips and ice chips   TPN + IV lipid emulsion (Lipids were missed 7/25 due to being in OR, never hung) Received Propofol 120mg  in OR on 7/25 (13.2 kcal)  Plan:   Change to Clinimix E 5/15 at 80 ml/hr for 24 hours with plan for possible wean on Saturday (no further cycling needed).  Continue 20% lipid emulsion at 81ml/hr over 12 hrs (note missed lipids on 7/25). Continue NS at 34ml/hr. TPN and IV lipid emulsion provides 96 g of protein and 1847 kCals per day meeting 100% of protein and kCal needs. Add MVI in TPN Add TE in TPN every other day, next 7/27 Add 70 units of regular insulin in TPN Continue moderate SSI with cyclic TPN and adjust as needed Give Magnesium 2g IV x1 today.  Monitor TPN labs - recheck Magnesium in AM.  F/U toleration of tube feeds (Per Surgery- if tolerates tube feeds, will start weaning TPN on Saturday)   Sloan Leiter, PharmD, BCPS Clinical Pharmacist Clinical Phone 03/11/2017 until 3:30 PM- # 03/11/2017 7:40 AM

## 2017-03-11 NOTE — Progress Notes (Signed)
PT Cancellation Note  Patient Details Name: Kaylee Mullins MRN: [id number] DOB: 03-09-1936   Cancelled Treatment:    Reason Eval/Treat Not Completed: Pain limiting ability to participate;Fatigue/lethargy limiting ability to participate (pt didn't sleep well, had surgery last night, c/o abdominal pain, RN notified. Will follow. )   Philomena Doheny 03/11/2017, 12:18 PM  [phone]

## 2017-03-11 NOTE — Progress Notes (Signed)
1 Day Post-Op   Subjective/Chief Complaint: Pt having some incisional pain.    Objective: Vital signs in last 24 hours: Temp:  [98.5 F (36.9 C)-99.2 F (37.3 C)] 98.6 F (37 C) (07/26 1058) Pulse Rate:  [70-82] 70 (07/26 1058) Resp:  [18-24] 18 (07/26 1058) BP: (147-180)/(57-87) 154/66 (07/26 1058) SpO2:  [97 %-100 %] 99 % (07/26 1058) Last BM Date: 03/08/17  Intake/Output from previous day: 07/25 0701 - 07/26 0700 In: 3029.1 [I.V.:2979.1] Out: 2925 [Urine:2200; Drains:75; Blood:50] Intake/Output this shift: Total I/O In: 60 [I.V.:10; Other:50] Out: 650 [Urine:600; Emesis/NG output:50]  Alert and cooperative Unlabored respirations Abdomen mildly distended (at baseline), G tube in place to gravity bag, J tube in place. Skin is warm and dry  Lab Results:   Recent Labs  03/10/17 0429  WBC 10.0  HGB 8.4*  HCT 26.3*  PLT 398   BMET  Recent Labs  03/10/17 0429 03/11/17 0450  NA 136 136  K 4.1 4.0  CL 108 111  CO2 25 21*  GLUCOSE 140* 278*  BUN 16 17  CREATININE 0.76 0.79  CALCIUM 8.2* 6.9*   PT/INR  Recent Labs  03/10/17 0429  LABPROT 15.5*  INR 1.22   ABG No results for input(s): PHART, HCO3 in the last 72 hours.  Invalid input(s): PCO2, PO2  Studies/Results: No results found.  Anti-infectives: Anti-infectives    Start     Dose/Rate Route Frequency Ordered Stop   03/05/17 0645  fluconazole (DIFLUCAN) tablet 200 mg     200 mg Oral  Once 03/05/17 0636 03/05/17 0657   02/16/17 1500  ceFAZolin (ANCEF) IVPB 2g/100 mL premix     2 g 200 mL/hr over 30 Minutes Intravenous To Radiology 02/16/17 1447 02/16/17 1613   02/09/17 0600  erythromycin (EES) 400 MG/5ML suspension 200 mg     200 mg Oral Every 6 hours 02/09/17 0147     02/07/17 0000  erythromycin (EES) 400 MG/5ML suspension 400 mg  Status:  Discontinued     400 mg Oral Every 6 hours 02/07/17 1932 02/09/17 0147   02/06/17 0600  erythromycin ethylsuccinate (EES) 200 MG/5ML suspension 400 mg   Status:  Discontinued     400 mg Oral Every 6 hours 02/06/17 0324 02/07/17 1932      Assessment/Plan: Gastroparesis post-Whipple - SIPS and chips - continue TPN for prolonged ileus/protein calorie malnutrition - Con't Gtube port to gravity      -Start trickle feeds today once nutrition recs in place.    Large L rectus hematoma  improving       - from anticoagulation. Stopped Lovenox.       - Hb stable     LLE DVT       - lovenox held       - IVC filter placed 7/18  PT/OT    LOS: 33 days    Maxime Beckner 03/11/2017

## 2017-03-11 NOTE — Progress Notes (Signed)
Nutrition Follow-up  DOCUMENTATION CODES:   Non-severe (moderate) malnutrition in context of acute illness/injury, Obesity unspecified  INTERVENTION:  1. Initiate Osmolite 1.5 at 9mL/hr maintain for 24 hrs per MD, following that time span, increase by 10 every 4 hours following to goal rate of 10mL/hr 2. Pro-Stat 63mL daily, each supplement provides 100 calories and 15 grams of protein 3. TPN per pharmacy  NUTRITION DIAGNOSIS:   Malnutrition related to chronic illness, acute illness as evidenced by mild depletion of muscle mass, mild depletion of body fat. 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 Had prolonged postop course due to delayed gastric emptying due to gastroparesis and intolerance of TNA, ultimately started on TPN. Represents from SNF after being discharged for less than 12 hours, due to vomiting and abdominal pain.    6/27- s/p endoscopy, which revealed normal esophagus, gastrojejunostmy with healthy appearing mucosa, and stomach with a lot of bilious fluid 6/27- NGT placed 6/30- per [facility] notes, unobstructed outflow from stomach, but no motility (outflow seems gravity dependent) 7/10- GJ tube placed, however, tip of tube in the stomach 7/17- CT revealed large L rectus hematoma 7/18- IVC filter placed 7/25 - J tube surgically placed   Intake/Output Summary (Last 24 hours) at 03/11/17 1641 Last data filed at 03/11/17 1518  Gross per 24 hour  Intake          2689.08 ml  Output             2975 ml  Net          -285.92 ml    Patient vomited approximately 1.5 hours ago, patient states it was 200cc. Feels uncomfortable, tender at incision site. Asking for food. Discussed with RN. G-tube to  gravity ------------------------------------------------------------------------------------------------------------------------- TPN plan per pharmacy: Change to Clinimix E 5/15 at 80 ml/hr for 24 hours with plan for possible wean on Saturday (no further cycling needed).  Continue 20% lipid emulsion at 2ml/hr over 12 hrs (note missed lipids on 7/25). ------------------------------------------------------------------------------------------------------------------------- Labs reviewed:  CBGs: 118-178 Medications reviewed and include: Novolog 0-15 units QID, Erythromycin, Phenergan, Zofran NS at 48mL/hr   Diet Order:  Diet NPO time specified Except for: Sips with Meds TPN (CLINIMIX-E) Adult  Skin:  Wound (see comment) (closed abdominal incision)  Last BM:  03/08/2017 (Type 5)  Height:   Ht Readings from Last 1 Encounters:  03/10/17 5\' 4"  (1.626 m)    Weight:   Wt Readings from Last 1 Encounters:  03/10/17 221 lb (100.2 kg)    Ideal Body Weight:  54.54 kg  BMI:  Body mass index is 37.93 kg/m.  Estimated Nutritional Needs:   Kcal:  1800-1950 (20-22 kcal/kg bw)  Protein:  82-93g Pro (1.5-1.7g/kg ibw)  Fluid:  1.8-2 L fluid (1 ml/kcal)  EDUCATION NEEDS:   No education needs identified at this time  Satira Anis. Koralee Wedeking, MS, RD LDN Inpatient Clinical Dietitian Pager [phone]

## 2017-03-11 NOTE — Anesthesia Postprocedure Evaluation (Signed)
Anesthesia Post Note  Patient: Kaylee Mullins  Procedure(s) Performed: Procedure(s) (LRB): EXPLORATORY LAPAROTOMY Open jejunostomy tube (N/A)     Patient location during evaluation: PACU Anesthesia Type: General Level of consciousness: awake and alert Pain management: pain level controlled Vital Signs Assessment: post-procedure vital signs reviewed and stable Respiratory status: spontaneous breathing, nonlabored ventilation and respiratory function stable Cardiovascular status: blood pressure returned to baseline and stable Postop Assessment: no signs of nausea or vomiting Anesthetic complications: no    Last Vitals:  Vitals:   03/11/17 0101 03/11/17 0600  BP: (!) 155/58 (!) 159/65  Pulse: 74 70  Resp: 18 18  Temp: 37.2 C 37 C    Last Pain:  Vitals:   03/11/17 0600  TempSrc: Oral  PainSc:                  Lameisha Schuenemann,W. EDMOND

## 2017-03-11 NOTE — Progress Notes (Signed)
Tube feeds started through [place], educated patient on signs/symptoms of not tolerating feeds and that if she does tolerate, will increase slowly to goal rate. Will continue to monitor.

## 2017-03-12 LAB — MAGNESIUM: Magnesium: 2 mg/dL (ref 1.7–2.4)

## 2017-03-12 LAB — GLUCOSE, CAPILLARY
Glucose-Capillary: 167 mg/dL — ABNORMAL HIGH (ref 65–99)
Glucose-Capillary: 123 mg/dL — ABNORMAL HIGH (ref 65–99)
Glucose-Capillary: 97 mg/dL (ref 65–99)
Glucose-Capillary: 154 mg/dL — ABNORMAL HIGH (ref 65–99)

## 2017-03-12 MED ORDER — FAT EMULSION 20 % IV EMUL
240.0000 mL | INTRAVENOUS | Status: AC
  Administered 2017-03-12: 240 mL via INTRAVENOUS
  Filled 2017-03-12: qty 250

## 2017-03-12 MED ORDER — M.V.I. ADULT IV INJ
INJECTION | INTRAVENOUS | Status: AC
  Administered 2017-03-12: 17:00:00 via INTRAVENOUS
  Filled 2017-03-12: qty 1920

## 2017-03-12 NOTE — Progress Notes (Signed)
PHARMACY - ADULT TOTAL PARENTERAL NUTRITION CONSULT NOTE   Pharmacy Consult:  TPN Indication: Ileus  Patient Measurements: Height: 5\' 4"  (162.6 cm) Weight: 221 lb (100.2 kg) IBW/kg (Calculated) : 54.7 TPN AdjBW (KG): 66.1 Body mass index is 37.93 kg/m.  Assessment:  14 YOF with severe malnutrition related to renal cell and pancreatic cancer who presented with abdominal mass on 01/12/17.  She is s/p Whipple procedure on the same day for pancreatic cancer.  She was discharged 02/05/17 and readmitted same day for vomiting at SNF. Pharmacy consulted to manage TPN for ileus that prevented oral intake and caused significant nausea and vomiting.   GI: Gastroparesis s/p Whipple. Per patient, feels much better today and having decreased abd wall pain. She states small amount of flatus and smear on 7/25, none today so far.  Last BM 7/23. Albumin low at 1.8. Prealbumin low at 11.5 on 7/22. 75 mL Drain O/P charted yesterday. S/p GJ 7/10, malpositioned, to be replaced with surgical FT. J tube placed 7/25. Tube feeds started 7/26 at 42ml/hr. Plan to continue this for 24 hours then start increasing to goal of 60ml/hr. If tolerates, will begin to wean TPN on Saturday per Surgery. -Erythromycin for gastroparesis- patient refusing, PPI IV, PRN Zofran/Phenergan (QTc 448ms on 6/17). Endo: hypothyroid on Synthroid.  DM not on med PTA. CBGs uncontrolled. (Off TPN 118, 178; on TPN 167-184). Spike on CBGs on 7/25 PM was likely related to Dexamethasone 10mg  dose given. Insulin requirements in the past 24 hours: 9 units SSI + 70 units in TPN Lytes: K wnl. Mg improved at 2 after replacement. CoCa 8.7. Phos wnl. Renal: AKI resolved. SCr wnl. BUN down 17. UOP 0.91ml/kg/hr. NS at 7ml/hr. Pulm: stable on RA  Cards: HTN - BP up, HR wnl - Norvasc, Toprol, PRN nifedipine XL  Hepatobil:LFTs wnl and Tbili back down to wnl. Trig wnl on 7/22. 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 Afebrile, WBC wnl. AC: (+)VTE.  CT (+) large rectus hematoma.  Lovenox stopped 7/17.  IVC filter placed 7/18. Hgb low but stable at 8.4(last transfusion 7/19), Pltc wnl  Best Practices: SCDs  TPN Access: Double lumen PICC 6/19 >> TPN start date: 01/23/17   Nutritional Goals (per RD recommendation on 7/26): 1800-1950 kCal 82-93 g protein  Current Nutrition: Osmolite 1.5 Cal at 20 mL/hr Prostate 40mL daily (100 calories, 15g protein) TPN + IV lipid emulsion   Plan:   Continue Clinimix E 5/15 at 80 ml/hr for 24 hours with plan for possible wean on Saturday (no further cycling needed).  Continue 20% lipid emulsion at 67ml/hr over 12 hrs (note missed lipids on 7/25). Continue NS at 16ml/hr. TPN and IV lipid emulsion provides 96 g of protein and 1847 kCals per day meeting 100% of protein and kCal needs. Add MVI in TPN Add TE in TPN every other day, next 7/27 Add 70 units of regular insulin in TPN Continue moderate SSI with cyclic TPN and adjust as needed Monitor TPN labs F/U toleration of tube feeds (Per Surgery- if tolerates tube feeds, will start weaning TPN on Saturday)   Sloan Leiter, PharmD, BCPS Clinical Pharmacist Clinical Phone 03/12/2017 until 3:30 PM- # 03/12/2017 7:01 AM

## 2017-03-12 NOTE — Progress Notes (Signed)
Occupational Therapy Treatment Patient Details Name: Kaylee Mullins MRN: [id number] DOB: 28-Dec-1935 Today's Date: 03/12/2017    History of present illness Pt is a 81 y/o female who was dishcharged and readmitted following new onset nausea at SNF. Pt is s/p whipple placement from previous admission, and had GJ tube placed this admission. Pt with LLE DVT and L abdomen intramuscular hematoma; lovenox discontinued and supposed to have IV filter placed today or tomorrow. PMH includes anxiety, thyroid cancer, pancreatic mass s/p whipple, DM, HTN, CVA with L sided weakness, and s/p lumbar surgery.    OT comments  Pt progressing towards goals, seated on BSC in bathroom upon entering room, reporting having completing sponge bath with family assist. Pt currently requires ModA for LB ADLs, MinGuard assist for functional mobility using RW. Pt will continue to benefit from OT services in acute and post acute settings to maximize Pt's safety and independence with ADLs and functional mobility. Will continue to follow.    Follow Up Recommendations  SNF;Supervision/Assistance - 24 hour    Equipment Recommendations  Other (comment) (TBD at next venue of care )          Precautions / Restrictions Precautions Precautions: Fall Precaution Comments: GJ tube  Required Braces or Orthoses: Other Brace/Splint Other Brace/Splint: abdominal binder.  Restrictions Weight Bearing Restrictions: No       Mobility Bed Mobility Overal bed mobility: Needs Assistance Bed Mobility: Sit to Supine       Sit to supine: Min guard;HOB elevated   General bed mobility comments: Increased time to progress LE onto bed. HOB elevated for trunk management, minguard for safety   Transfers Overall transfer level: Needs assistance Equipment used: Rolling walker (2 wheeled) Transfers: Sit to/from Stand Sit to Stand: Min guard         General transfer comment: Pt completed sit<>stand x3 times during session with  MinGuard, completes room level functional mobility from bathroom to sitting EOB using RW with minGuard throughout    Balance Overall balance assessment: Needs assistance Sitting-balance support: Feet supported;No upper extremity supported Sitting balance-Leahy Scale: Good     Standing balance support: No upper extremity supported;During functional activity Standing balance-Leahy Scale: Fair Standing balance comment: able to pull pants/underwear over hips with minguard for steady assist                            ADL either performed or assessed with clinical judgement   ADL Overall ADL's : Needs assistance/impaired                     Lower Body Dressing: Moderate assistance;Sit to/from stand Lower Body Dressing Details (indicate cue type and reason): assist to thread LEs through pant legs, Pt can pull over hips with minGuard for safety in standing  Toilet Transfer: Min guard;Ambulation;BSC;RW           Functional mobility during ADLs: Min guard;Rolling walker General ADL Comments: Pt completing sponge bathing, UB/LB dressing with family assist while seated on BSC in bathroom upon entering room. Assisted Pt with completing LB dressing and room level functional mobility to return to sitting EOB/supine in bed                        Cognition Arousal/Alertness: Awake/alert Behavior During Therapy: [facility] for tasks assessed/performed Overall Cognitive Status: Within Functional Limits for tasks assessed  General Comments Pt's family present during session     Pertinent Vitals/ Pain       Pain Assessment: Faces Faces Pain Scale: Hurts a little bit Pain Location: abdomen Pain Descriptors / Indicators: Sore Pain Intervention(s): Limited activity within patient's tolerance;Monitored during session;Repositioned;Patient requesting pain meds-RN notified                                                           Frequency  Min 2X/week        Progress Toward Goals  OT Goals(current goals can now be found in the care plan section)  Progress towards OT goals: Progressing toward goals  Acute Rehab OT Goals Patient Stated Goal: to get better  OT Goal Formulation: With patient Time For Goal Achievement: 03/19/17 Potential to Achieve Goals: Good  Plan Discharge plan remains appropriate                     AM-PAC PT "6 Clicks" Daily Activity     Outcome Measure   Help from another person eating meals?: None Help from another person taking care of personal grooming?: A Little Help from another person toileting, which includes using toliet, bedpan, or urinal?: A Little Help from another person bathing (including washing, rinsing, drying)?: A Little Help from another person to put on and taking off regular upper body clothing?: A Little Help from another person to put on and taking off regular lower body clothing?: A Lot 6 Click Score: 18    End of Session Equipment Utilized During Treatment: Gait belt;Rolling walker  OT Visit Diagnosis: Unsteadiness on feet (R26.81);Pain Pain - Right/Left:  (abdomen) Pain - part of body:  (abdomen)   Activity Tolerance Patient tolerated treatment well   Patient Left in bed;with call bell/phone within reach;with family/visitor present   Nurse Communication Patient requests pain meds        Time: 2035-5974 OT Time Calculation (min): 22 min  Charges: OT General Charges $OT Visit: 1 Procedure OT Treatments $Self Care/Home Management : 8-22 mins  Kaylee Mullins, OT Pager [phone] 03/12/2017    Kaylee Mullins 03/12/2017, 5:45 PM

## 2017-03-12 NOTE — Progress Notes (Signed)
Increased tube feed rate to 50/hr at 2240 but patient c/o abdominal tightness ane nausea, decreased back to 20/hr. Will continue to monitor.

## 2017-03-12 NOTE — Care Management Important Message (Signed)
Important Message  Patient Details  Name: Kaylee Mullins MRN: [id number] Date of Birth: January 17, 1936   Medicare Important Message Given:  Yes    Orbie Pyo 03/12/2017, 8:34 AM

## 2017-03-12 NOTE — Progress Notes (Signed)
PT Cancellation Note  Patient Details Name: Kaylee Mullins MRN: [id number] DOB: [birth year]   Cancelled Treatment:    Reason Eval/Treat Not Completed: Medical issues which prohibited therapy Pt reporting increased nausea and reports she doesn't feel well enough to participate. Will reattempt as schedule allows.     Leighton Ruff, PT, DPT  Acute Rehabilitation Services  Pager: [phone]    Rudean Hitt 03/12/2017, 10:54 AM

## 2017-03-12 NOTE — Progress Notes (Signed)
2 Days Post-Op   Subjective/Chief Complaint: Pt not feeling as good today since tube feeds started.    Objective: Vital signs in last 24 hours: Temp:  [98.4 F (36.9 C)-99 F (37.2 C)] 99 F (37.2 C) (07/27 0552) Pulse Rate:  [69-73] 70 (07/27 0552) Resp:  [18] 18 (07/27 0552) BP: (144-181)/(52-68) 144/59 (07/27 0552) SpO2:  [99 %-100 %] 99 % (07/27 0552) Last BM Date: 03/08/17  Intake/Output from previous day: 07/26 0701 - 07/27 0700 In: 395 [I.V.:345] Out: 1150 [Urine:1100; Emesis/NG output:50] Intake/Output this shift: Total I/O In: 0  Out: 900 [Urine:900]  Alert and cooperative Unlabored respirations Abdomen mildly distended (at baseline), G tube in place to gravity bag, J tube in place. Tube feeds in the proper tube. Skin is warm and dry  Lab Results:   Recent Labs  03/10/17 0429  WBC 10.0  HGB 8.4*  HCT 26.3*  PLT 398   BMET  Recent Labs  03/10/17 0429 03/11/17 0450  NA 136 136  K 4.1 4.0  CL 108 111  CO2 25 21*  GLUCOSE 140* 278*  BUN 16 17  CREATININE 0.76 0.79  CALCIUM 8.2* 6.9*   PT/INR  Recent Labs  03/10/17 0429  LABPROT 15.5*  INR 1.22   ABG No results for input(s): PHART, HCO3 in the last 72 hours.  Invalid input(s): PCO2, PO2  Studies/Results: No results found.  Anti-infectives: Anti-infectives    Start     Dose/Rate Route Frequency Ordered Stop   03/05/17 0645  fluconazole (DIFLUCAN) tablet 200 mg     200 mg Oral  Once 03/05/17 0636 03/05/17 0657   02/16/17 1500  ceFAZolin (ANCEF) IVPB 2g/100 mL premix     2 g 200 mL/hr over 30 Minutes Intravenous To Radiology 02/16/17 1447 02/16/17 1613   02/09/17 0600  erythromycin (EES) 400 MG/5ML suspension 200 mg     200 mg Oral Every 6 hours 02/09/17 0147     02/07/17 0000  erythromycin (EES) 400 MG/5ML suspension 400 mg  Status:  Discontinued     400 mg Oral Every 6 hours 02/07/17 1932 02/09/17 0147   02/06/17 0600  erythromycin ethylsuccinate (EES) 200 MG/5ML suspension 400 mg   Status:  Discontinued     400 mg Oral Every 6 hours 02/06/17 0324 02/07/17 1932      Assessment/Plan: Gastroparesis post-Whipple - SIPS and chips - continue TPN for prolonged ileus/protein calorie malnutrition - Con't Gtube port to gravity       -Leave tube feeds at trickle feeds today.  Large L rectus hematoma  improving       - from anticoagulation. Stopped Lovenox.       - Hb stable     LLE DVT       - lovenox held       - IVC filter placed 7/18  PT/OT Placement next week once tolerating tube feeds.     LOS: 34 days    Kaylee Mullins 03/12/2017

## 2017-03-13 LAB — GLUCOSE, CAPILLARY
Glucose-Capillary: 174 mg/dL — ABNORMAL HIGH (ref 65–99)
Glucose-Capillary: 125 mg/dL — ABNORMAL HIGH (ref 65–99)
Glucose-Capillary: 128 mg/dL — ABNORMAL HIGH (ref 65–99)
Glucose-Capillary: 130 mg/dL — ABNORMAL HIGH (ref 65–99)

## 2017-03-13 MED ORDER — FAT EMULSION 20 % IV EMUL
240.0000 mL | INTRAVENOUS | Status: AC
  Administered 2017-03-13: 240 mL via INTRAVENOUS
  Filled 2017-03-13: qty 250

## 2017-03-13 MED ORDER — INSULIN ASPART 100 UNIT/ML ~~LOC~~ SOLN
0.0000 [IU] | Freq: Four times a day (QID) | SUBCUTANEOUS | Status: DC
Start: 2017-03-13 — End: 2017-03-25
  Administered 2017-03-13 – 2017-03-14 (×3): 2 [IU] via SUBCUTANEOUS
  Administered 2017-03-14: 5 [IU] via SUBCUTANEOUS
  Administered 2017-03-14: 3 [IU] via SUBCUTANEOUS
  Administered 2017-03-14 – 2017-03-15 (×2): 2 [IU] via SUBCUTANEOUS
  Administered 2017-03-15: 3 [IU] via SUBCUTANEOUS
  Administered 2017-03-15 – 2017-03-16 (×2): 2 [IU] via SUBCUTANEOUS
  Administered 2017-03-16 – 2017-03-17 (×4): 3 [IU] via SUBCUTANEOUS
  Administered 2017-03-18 – 2017-03-21 (×9): 2 [IU] via SUBCUTANEOUS
  Administered 2017-03-21 – 2017-03-22 (×5): 3 [IU] via SUBCUTANEOUS
  Administered 2017-03-22 (×2): 5 [IU] via SUBCUTANEOUS
  Administered 2017-03-23 – 2017-03-24 (×5): 3 [IU] via SUBCUTANEOUS
  Administered 2017-03-24: 2 [IU] via SUBCUTANEOUS
  Administered 2017-03-24: 3 [IU] via SUBCUTANEOUS
  Administered 2017-03-25 (×2): 2 [IU] via SUBCUTANEOUS

## 2017-03-13 MED ORDER — M.V.I. ADULT IV INJ
INTRAVENOUS | Status: AC
  Administered 2017-03-13: 17:00:00 via INTRAVENOUS
  Filled 2017-03-13: qty 960

## 2017-03-13 NOTE — Progress Notes (Signed)
PHARMACY - ADULT TOTAL PARENTERAL NUTRITION CONSULT NOTE   Pharmacy Consult:  TPN Indication: Ileus  Patient Measurements: Height: 5\' 4"  (162.6 cm) Weight: 233 lb 6.4 oz (105.9 kg) IBW/kg (Calculated) : 54.7 TPN AdjBW (KG): 66.1 Body mass index is 40.06 kg/m.  Assessment:  73 YOF with severe malnutrition related to renal cell and pancreatic cancer who presented with abdominal mass on 01/12/17.  She is s/p Whipple procedure on the same day for pancreatic cancer.  She was discharged 02/05/17 and readmitted same day for vomiting at SNF. Pharmacy consulted to manage TPN for ileus that prevented oral intake and caused significant nausea and vomiting.   GI: Gastroparesis s/p Whipple. Per patient, feels much better today and having decreased abd wall pain. She states small amount of flatus and smear on 7/25. Last BM 7/23. Albumin low at 1.8. Prealbumin low at 11.5 on 7/22. 50 mL Drain O/P charted yesterday. S/p GJ 7/10, malpositioned, to be replaced with surgical FT. J tube placed 7/25. Tube feeds started 7/26 at 59ml/hr. Did try and increase to goal but patient complained of abdominal tightness. Patient not feeling as well overall since TFs started so holding off on increasing. Plan to increase to goal of 88ml/hr as tolerated.  Erythromycin for gastroparesis- patient refusing, PPI IV, PRN Zofran/Phenergan (QTc 472ms on 6/17). Endo: Hypothyroid on Synthroid.  DM not on med PTA. CBGs mostly controlled (90-170s) Spike on CBGs on 7/25 PM was likely related to Dexamethasone 10mg  dose given. Insulin requirements in the past 24 hours: 8 units SSI + 70 units in TPN Lytes: K wnl. Mg improved at 2 after replacement. CoCa 8.7. Phos wnl. Renal: AKI resolved. SCr wnl. BUN down 17. UOP low at 0.15ml/kg/hr. NS at 55ml/hr. Pulm: stable on RA  Cards: HTN - BP up, HR wnl - Norvasc, Toprol, PRN nifedipine XL  Hepatobil:LFTs wnl and Tbili back down to wnl. Trig wnl on 7/22. 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 Afebrile, WBC wnl. AC: (+)VTE.  CT (+) large rectus hematoma.  Lovenox stopped 7/17.  IVC filter placed 7/18. Hgb low but stable at 8.4(last transfusion 7/19), Pltc wnl  Best Practices: SCDs  TPN Access: Double lumen PICC 6/19 >> TPN start date: 01/23/17   Nutritional Goals (per RD recommendation on 7/26): 1800-1950 kCal 82-93 g protein  Current Nutrition: Osmolite 1.5 Cal at 20 mL/hr (30 g of protein and 720 kcal) Prostate 56mL daily (15 g of protein and 100 calories) TPN + IV lipid emulsion   Plan:   Decrease Clinimix E 5/15 to 40 ml/hr Continue IV lipid emulsions at 56ml/hr over 12 hrs Continue Osmolite 1.5 cal at 63ml/hr Continue Prostat 8mL daily  Continue NS at 45ml/hr. TPN, IV lipid emulsion, TF, and Prostat provide 93 g of protein and 1982 kcal per day meeting 100% of protein and kCal needs. Add MVI in TPN Add TE in TPN every other day, next 7/29 Decrease to 35 units of regular insulin in TPN Change to moderate Q6h SSI and adjust as needed Monitor TPN labs F/U ability to advance TFs to goal and weaning off of TPN   Elenor Quinones, PharmD, [facility] Clinical Pharmacist Pager [phone] 03/13/2017 7:56 AM

## 2017-03-13 NOTE — Clinical Social Work Note (Signed)
CSW continues to follow for discharge needs.   [name], [place], [place] Work [phone]

## 2017-03-13 NOTE — Progress Notes (Signed)
3 Days Post-Op   Subjective/Chief Complaint: Feels bloated. Not nauseated presently but was some last night. Tube feeds had to be held for a couple hours overnight. No BM or flatus- she says last bm was 3 days ago   Objective: Vital signs in last 24 hours: Temp:  [98.8 F (37.1 C)-99.5 F (37.5 C)] 98.8 F (37.1 C) (07/28 0414) Pulse Rate:  [72-75] 73 (07/28 0414) Resp:  [18] 18 (07/28 0414) BP: (154-168)/(58-69) 154/58 (07/28 0414) SpO2:  [98 %-100 %] 98 % (07/28 0414) Weight:  [105.9 kg (233 lb 6.4 oz)] 105.9 kg (233 lb 6.4 oz) (07/28 0414) Last BM Date: 03/08/17  Intake/Output from previous day: 07/27 0701 - 07/28 0700 In: 2400 [I.V.:1760; NG/GT:300; IV Piggyback:200] Out: 1200 [Urine:900; Drains:300] Intake/Output this shift: Total I/O In: -  Out: 650 [Urine:650]  Alert and cooperative Unlabored respirations Abdomen  distended (at baseline), appropriately tender. G tube in place to gravity bag, J tube in place. Tube feeds in the proper tube. Skin is warm and dry  Lab Results:  No results for input(s): WBC, HGB, HCT, PLT in the last 72 hours. BMET  Recent Labs  03/11/17 0450  NA 136  K 4.0  CL 111  CO2 21*  GLUCOSE 278*  BUN 17  CREATININE 0.79  CALCIUM 6.9*   PT/INR No results for input(s): LABPROT, INR in the last 72 hours. ABG No results for input(s): PHART, HCO3 in the last 72 hours.  Invalid input(s): PCO2, PO2  Studies/Results: No results found.  Anti-infectives: Anti-infectives    Start     Dose/Rate Route Frequency Ordered Stop   03/05/17 0645  fluconazole (DIFLUCAN) tablet 200 mg     200 mg Oral  Once 03/05/17 0636 03/05/17 0657   02/16/17 1500  ceFAZolin (ANCEF) IVPB 2g/100 mL premix     2 g 200 mL/hr over 30 Minutes Intravenous To Radiology 02/16/17 1447 02/16/17 1613   02/09/17 0600  erythromycin (EES) 400 MG/5ML suspension 200 mg     200 mg Oral Every 6 hours 02/09/17 0147     02/07/17 0000  erythromycin (EES) 400 MG/5ML suspension  400 mg  Status:  Discontinued     400 mg Oral Every 6 hours 02/07/17 1932 02/09/17 0147   02/06/17 0600  erythromycin ethylsuccinate (EES) 200 MG/5ML suspension 400 mg  Status:  Discontinued     400 mg Oral Every 6 hours 02/06/17 0324 02/07/17 1932      Assessment/Plan: Gastroparesis post-Whipple - SIPS and chips - continue TPN for prolonged ileus/protein calorie malnutrition - Con't Gtube port to gravity       -Leave tube feeds at trickle feeds today. Will wait until she has a bowel movement to advance.   Large L rectus hematoma  improving       - from anticoagulation. Stopped Lovenox.       - Hb stable     LLE DVT       - lovenox held       - IVC filter placed 7/18  PT/OT Placement next week once tolerating tube feeds.     LOS: 35 days    Clovis Riley 03/13/2017

## 2017-03-14 LAB — GLUCOSE, CAPILLARY
Glucose-Capillary: 150 mg/dL — ABNORMAL HIGH (ref 65–99)
Glucose-Capillary: 127 mg/dL — ABNORMAL HIGH (ref 65–99)
Glucose-Capillary: 216 mg/dL — ABNORMAL HIGH (ref 65–99)
Glucose-Capillary: 162 mg/dL — ABNORMAL HIGH (ref 65–99)

## 2017-03-14 MED ORDER — TRACE MINERALS CR-CU-MN-SE-ZN 10-1000-500-60 MCG/ML IV SOLN
INTRAVENOUS | Status: AC
  Administered 2017-03-14: 18:00:00 via INTRAVENOUS
  Filled 2017-03-14: qty 960

## 2017-03-14 MED ORDER — FAT EMULSION 20 % IV EMUL
240.0000 mL | INTRAVENOUS | Status: AC
  Administered 2017-03-14: 240 mL via INTRAVENOUS
  Filled 2017-03-14: qty 250

## 2017-03-14 NOTE — Progress Notes (Signed)
Pt still complaining of nausea and bloatness and requested for tube feeding to be turned off. Tube feeding held. Will continue to monitor pt.

## 2017-03-14 NOTE — Progress Notes (Signed)
Pt c/o of nausea. PRN nausea meds given. Tube feeding running at 80ml/hr. Will continue to monitor pt.

## 2017-03-14 NOTE — Progress Notes (Signed)
Pt nauseated at 2230 last night with bloating feeling. No Vomitting. Hold tube feeding for 2 hours. Pt had a large BM at 0230. Tolerating tube feeding at 27ml/hr.

## 2017-03-14 NOTE — Progress Notes (Signed)
4 Days Post-Op   Subjective/Chief Complaint: Still bloated with some abdominal pain but states its a little better. Had a BM overnight, but had to hold TF for 2 hrs overnight.   Objective: Vital signs in last 24 hours: Temp:  [98.9 F (37.2 C)-99.3 F (37.4 C)] 99 F (37.2 C) (07/29 0600) Pulse Rate:  [67-71] 67 (07/29 0600) Resp:  [16-18] 16 (07/29 0600) BP: (142-173)/(58-65) 142/65 (07/29 0600) SpO2:  [98 %-100 %] 100 % (07/29 0600) Last BM Date: 03/14/17  Intake/Output from previous day: 07/28 0701 - 07/29 0700 In: 1040.3 [I.V.:980.3; IV Piggyback:60] Out: 850 [Urine:850] Intake/Output this shift: No intake/output data recorded.  Alert and cooperative Unlabored respirations Abdomen  distended (at baseline), appropriately tender. G tube in place to gravity bag, J tube in place.  Skin is warm and dry  Lab Results:  No results for input(s): WBC, HGB, HCT, PLT in the last 72 hours. BMET No results for input(s): NA, K, CL, CO2, GLUCOSE, BUN, CREATININE, CALCIUM in the last 72 hours. PT/INR No results for input(s): LABPROT, INR in the last 72 hours. ABG No results for input(s): PHART, HCO3 in the last 72 hours.  Invalid input(s): PCO2, PO2  Studies/Results: No results found.  Anti-infectives: Anti-infectives    Start     Dose/Rate Route Frequency Ordered Stop   03/05/17 0645  fluconazole (DIFLUCAN) tablet 200 mg     200 mg Oral  Once 03/05/17 0636 03/05/17 0657   02/16/17 1500  ceFAZolin (ANCEF) IVPB 2g/100 mL premix     2 g 200 mL/hr over 30 Minutes Intravenous To Radiology 02/16/17 1447 02/16/17 1613   02/09/17 0600  erythromycin (EES) 400 MG/5ML suspension 200 mg     200 mg Oral Every 6 hours 02/09/17 0147     02/07/17 0000  erythromycin (EES) 400 MG/5ML suspension 400 mg  Status:  Discontinued     400 mg Oral Every 6 hours 02/07/17 1932 02/09/17 0147   02/06/17 0600  erythromycin ethylsuccinate (EES) 200 MG/5ML suspension 400 mg  Status:  Discontinued     400  mg Oral Every 6 hours 02/06/17 0324 02/07/17 1932      Assessment/Plan: Gastroparesis post-Whipple - SIPS and chips - continue TPN for prolonged ileus/protein calorie malnutrition - Con't Gtube port to gravity       - Leave tube feeds at trickle feeds today. She had a bowel movement but still needing to pause intermittently   Large L rectus hematoma  improving       - from anticoagulation. Stopped Lovenox.       - Hb stable     LLE DVT       - lovenox held       - IVC filter placed 7/18  PT/OT Placement next week once tolerating tube feeds.     LOS: 36 days    Clovis Riley 03/14/2017

## 2017-03-14 NOTE — Progress Notes (Signed)
PHARMACY - ADULT TOTAL PARENTERAL NUTRITION CONSULT NOTE   Pharmacy Consult:  TPN Indication: Ileus  Patient Measurements: Height: 5\' 4"  (162.6 cm) Weight: 233 lb 6.4 oz (105.9 kg) IBW/kg (Calculated) : 54.7 TPN AdjBW (KG): 66.1 Body mass index is 40.06 kg/m.  Assessment:  32 YOF with severe malnutrition related to renal cell and pancreatic cancer who presented with abdominal mass on 01/12/17.  She is s/p Whipple procedure on the same day for pancreatic cancer.  She was discharged 02/05/17 and readmitted same day for vomiting at SNF. Pharmacy consulted to manage TPN for ileus that prevented oral intake and caused significant nausea and vomiting.   GI: Gastroparesis s/p Whipple. S/p GJ 7/10, malpositioned, to be replaced with surgical FT. J tube placed 7/25. Tube feeds started 7/26 at 53ml/hr. Did try and increase to goal but patient complained of abdominal tightness. Had felt nauseated and bloated last night again until had large BM. TFs have had to been held a couple times but currently tolerating at 8ml/hr. Albumin low at 1.8. Prealbumin low at 11.5 on 7/22. No drain OP charted yesterday. Had 3 BMs in last 24 hrs. Erythromycin for gastroparesis- patient refusing, PPI IV, PRN Zofran/Phenergan (QTc 45ms on 6/17). Endo: Hypothyroid on Synthroid.  DM not on med PTA. CBGs mostly controlled (120-150s) Spike on CBGs on 7/25 PM was likely related to Dexamethasone 10mg  dose given. Insulin requirements in the past 24 hours: 8 units SSI + 35 units in TPN Lytes: wnl on 7/26. Will check labs tomorrow. CoCa 8.7. Phos wnl. Renal: AKI resolved. SCr wnl. BUN down 17. UOP not all charted yesterday. NS at 5ml/hr. Pulm: stable on RA  Cards: HTN - BP up, HR wnl - Norvasc, Toprol, PRN nifedipine XL  Hepatobil:LFTs wnl and Tbili back down to wnl. Trig wnl on 7/22. 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 Afebrile, WBC wnl. AC:  (+)VTE.  CT (+) large rectus hematoma.  Lovenox stopped 7/17.  IVC filter placed 7/18. Hgb low but stable at 8.4(last transfusion 7/19), Pltc wnl  Best Practices: SCDs  TPN Access: Double lumen PICC 6/19 >> TPN start date: 01/23/17   Nutritional Goals (per RD recommendation on 7/26): 1800-1950 kCal 82-93 g protein  Current Nutrition: Osmolite 1.5 Cal at 20 mL/hr (30 g of protein and 720 kcal) Prostate 63mL daily (15 g of protein and 100 calories) TPN + IV lipid emulsion   Plan:   Continue Clinimix E 5/15 at 40 ml/hr Continue IV lipid emulsions at 25ml/hr over 12 hrs Continue Osmolite 1.5 cal at 85ml/hr  Continue Prostat 44mL daily  Continue NS at 16ml/hr. TPN, IV lipid emulsion, TF, and Prostat provide 93 g of protein and 1982 kcal per day meeting 100% of protein and kCal needs. Add MVI in TPN Add TE in TPN every other day, next 7/29 Continue 35 units of regular insulin in TPN Continue moderate Q6h SSI and adjust as needed Monitor TPN labs F/U ability to advance TFs to goal and weaning off of TPN   Elenor Quinones, PharmD, BCPS Clinical Pharmacist Pager [phone] 03/14/2017 7:22 AM

## 2017-03-15 LAB — DIFFERENTIAL
Basophils Absolute: 0 10*3/uL (ref 0.0–0.1)
Basophils Relative: 0 %
Eosinophils Absolute: 0.3 10*3/uL (ref 0.0–0.7)
Eosinophils Relative: 3 %
Lymphocytes Relative: 14 %
Lymphs Abs: 1.5 10*3/uL (ref 0.7–4.0)
Monocytes Absolute: 0.6 10*3/uL (ref 0.1–1.0)
Monocytes Relative: 5 %
Neutro Abs: 7.9 10*3/uL — ABNORMAL HIGH (ref 1.7–7.7)
Neutrophils Relative %: 78 %

## 2017-03-15 LAB — CBC
HCT: 26.1 % — ABNORMAL LOW (ref 36.0–46.0)
Hemoglobin: 8.2 g/dL — ABNORMAL LOW (ref 12.0–15.0)
MCH: 29.2 pg (ref 26.0–34.0)
MCHC: 31.4 g/dL (ref 30.0–36.0)
MCV: 92.9 fL (ref 78.0–100.0)
Platelets: 436 10*3/uL — ABNORMAL HIGH (ref 150–400)
RBC: 2.81 MIL/uL — ABNORMAL LOW (ref 3.87–5.11)
RDW: 17.4 % — ABNORMAL HIGH (ref 11.5–15.5)
WBC: 10.2 10*3/uL (ref 4.0–10.5)

## 2017-03-15 LAB — COMPREHENSIVE METABOLIC PANEL
ALT: 37 U/L (ref 14–54)
AST: 34 U/L (ref 15–41)
Albumin: 2.2 g/dL — ABNORMAL LOW (ref 3.5–5.0)
Alkaline Phosphatase: 126 U/L (ref 38–126)
Anion gap: 6 (ref 5–15)
BUN: 11 mg/dL (ref 6–20)
CO2: 25 mmol/L (ref 22–32)
Calcium: 7.7 mg/dL — ABNORMAL LOW (ref 8.9–10.3)
Chloride: 107 mmol/L (ref 101–111)
Creatinine, Ser: 0.77 mg/dL (ref 0.44–1.00)
GFR calc Af Amer: >60 mL/min (ref >60)
GFR calc non Af Amer: >60 mL/min (ref >60)
Glucose, Bld: 117 mg/dL — ABNORMAL HIGH (ref 65–99)
Potassium: 3.5 mmol/L (ref 3.5–5.1)
Sodium: 138 mmol/L (ref 135–145)
Total Bilirubin: 1 mg/dL (ref 0.3–1.2)
Total Protein: 6.1 g/dL — ABNORMAL LOW (ref 6.5–8.1)

## 2017-03-15 LAB — GLUCOSE, CAPILLARY
Glucose-Capillary: 106 mg/dL — ABNORMAL HIGH (ref 65–99)
Glucose-Capillary: 122 mg/dL — ABNORMAL HIGH (ref 65–99)
Glucose-Capillary: 124 mg/dL — ABNORMAL HIGH (ref 65–99)
Glucose-Capillary: 142 mg/dL — ABNORMAL HIGH (ref 65–99)
Glucose-Capillary: 183 mg/dL — ABNORMAL HIGH (ref 65–99)

## 2017-03-15 LAB — MAGNESIUM: Magnesium: 1.8 mg/dL (ref 1.7–2.4)

## 2017-03-15 LAB — PHOSPHORUS: Phosphorus: 3 mg/dL (ref 2.5–4.6)

## 2017-03-15 LAB — PREALBUMIN: Prealbumin: 14.3 mg/dL — ABNORMAL LOW (ref 18–38)

## 2017-03-15 LAB — TRIGLYCERIDES: Triglycerides: 95 mg/dL (ref <150)

## 2017-03-15 MED ORDER — FAT EMULSION 20 % IV EMUL
240.0000 mL | INTRAVENOUS | Status: AC
  Administered 2017-03-15: 240 mL via INTRAVENOUS
  Filled 2017-03-15: qty 250

## 2017-03-15 MED ORDER — M.V.I. ADULT IV INJ
INTRAVENOUS | Status: AC
  Administered 2017-03-15: 18:00:00 via INTRAVENOUS
  Filled 2017-03-15: qty 960

## 2017-03-15 MED ORDER — POTASSIUM CHLORIDE 20 MEQ/15ML (10%) PO SOLN
30.0000 meq | ORAL | Status: AC
  Administered 2017-03-15 (×2): 30 meq
  Filled 2017-03-15 (×2): qty 30

## 2017-03-15 MED ORDER — MAGNESIUM SULFATE 2 GM/50ML IV SOLN
2.0000 g | Freq: Once | INTRAVENOUS | Status: AC
  Administered 2017-03-15: 2 g via INTRAVENOUS
  Filled 2017-03-15: qty 50

## 2017-03-15 NOTE — Progress Notes (Signed)
Resumed tube feeding.

## 2017-03-15 NOTE — Progress Notes (Signed)
PHARMACY - ADULT TOTAL PARENTERAL NUTRITION CONSULT NOTE   Pharmacy Consult:  TPN Indication: Ileus  Patient Measurements: Height: 5\' 4"  (162.6 cm) Weight: 239 lb 4.8 oz (108.5 kg) IBW/kg (Calculated) : 54.7 TPN AdjBW (KG): 66.1 Body mass index is 41.08 kg/m.  Assessment:  19 YOF with severe malnutrition related to renal cell and pancreatic cancer who presented with abdominal mass on 01/12/17.  She is s/p Whipple procedure on the same day for pancreatic cancer.  She was discharged 02/05/17 and readmitted same day for vomiting at SNF. Pharmacy consulted to manage TPN for ileus that prevented oral intake and caused significant nausea and vomiting.   GI: Gastroparesis s/p Whipple. S/p GJ 7/10, malpositioned, to be replaced with surgical FT. J tube placed 7/25. Tube feeds started 7/26 at 60ml/hr. Did try and increase to goal but patient complained of abdominal tightness. Has felt nauseated and bloated off and on so TFs have been held intermittently. Albumin low but up to 2.2. Prealbumin low but up to 14.3. Drain OP yesterday was 200 mL. Last BM was on 7/29. Erythromycin for gastroparesis, PPI IV, PRN Zofran/Phenergan (QTc 467ms on 6/17). Endo: Hypothyroid on Synthroid.  DM not on med PTA. CBGs mostly controlled (110-160s) Spike on CBGs on 7/25 PM was likely related to Dexamethasone 10mg  dose given and had another spike of 216 yesterday. Insulin requirements in the past 24 hours: 12 units SSI + 35 units in TPN Lytes: wnl exc K low at 3.5 (goal > 4 with ileus) Phos ok and Mg low at 1.8 (goal > 2 with ileus) CoCa 8.7. Phos wnl. Renal: AKI resolved. SCr wnl. BUN down 17. UOP not all charted yesterday. NS at 33ml/hr. Pulm: stable on RA  Cards: HTN - BP up, HR wnl - Norvasc, Toprol, PRN nifedipine XL  Hepatobil:LFTs wnl and Tbili back down to wnl. Trig wnl at 95 Neuro: hx CVA 2016 with residual L-hand and foot weakness - PRN Dilaudid/oxy ID: Nystatin for thrush- patient refusing. Afebrile, WBC wnl. AC:  (+)VTE.  CT (+) large rectus hematoma.  Lovenox stopped 7/17.  IVC filter placed 7/18. Hgb low but stable at 8.4(last transfusion 7/19), Pltc wnl  Best Practices: SCDs  TPN Access: Double lumen PICC 6/19 >> TPN start date: 01/23/17   Nutritional Goals (per RD recommendation on 7/26): 1800-1950 kCal 82-93 g protein  Current Nutrition: Osmolite 1.5 Cal at 20 mL/hr (30 g of protein and 720 kcal) Prostate 72mL daily (15 g of protein and 100 calories) TPN + IV lipid emulsion   Plan:   Continue Clinimix E 5/15 at 40 ml/hr Continue IV lipid emulsions at 13ml/hr over 12 hrs Continue Osmolite 1.5 cal at 109ml/hr. Advance to goal as tolerated Continue Prostat 27mL daily  Continue NS at 79ml/hr. TPN, IV lipid emulsion, TF, and Prostat provide 93 g of protein and 1982 kcal per day meeting 100% of protein and kCal needs. Add MVI in TPN Add TE in TPN every other day, next 7/31 Increase to 40 units of regular insulin in TPN Continue moderate Q6h SSI and adjust as needed Monitor TPN labs, Bmet tomorrow F/U ability to advance TFs to goal and weaning off of TPN  Give Mg 2g IV x 1 Give potassium 47mEq PO solution per tube x 2 doses   Elenor Quinones, PharmD, BCPS Clinical Pharmacist Pager [phone] 03/15/2017 7:22 AM

## 2017-03-15 NOTE — Progress Notes (Signed)
5 Days Post-Op   Subjective/Chief Complaint: Continues to feel bloated with tube feeds.  Tube feeds on held now.   Objective: Vital signs in last 24 hours: Temp:  [97.7 F (36.5 C)-99.1 F (37.3 C)] 97.7 F (36.5 C) (07/30 1430) Pulse Rate:  [74-81] 77 (07/30 1430) Resp:  [16-18] 18 (07/30 0518) BP: (157-172)/(65-77) 157/75 (07/30 1430) SpO2:  [100 %] 100 % (07/30 1430) Weight:  [108.5 kg (239 lb 4.8 oz)] 108.5 kg (239 lb 4.8 oz) (07/29 2109) Last BM Date: 03/14/17  Intake/Output from previous day: 07/29 0701 - 07/30 0700 In: 1735.7 [I.V.:1533; NG/GT:142.7; IV Piggyback:60] Out: 1300 [Urine:1100; Drains:200] Intake/Output this shift: Total I/O In: -  Out: 500 [Urine:500]  Alert and cooperative Unlabored respirations Abdomen  distended (at baseline), appropriately tender. G tube in place to gravity bag, J tube in place.  Skin is warm and dry   Lab Results:   Recent Labs  03/15/17 0336  WBC 10.2  HGB 8.2*  HCT 26.1*  PLT 436*   BMET  Recent Labs  03/15/17 0336  NA 138  K 3.5  CL 107  CO2 25  GLUCOSE 117*  BUN 11  CREATININE 0.77  CALCIUM 7.7*   PT/INR No results for input(s): LABPROT, INR in the last 72 hours. ABG No results for input(s): PHART, HCO3 in the last 72 hours.  Invalid input(s): PCO2, PO2  Studies/Results: No results found.  Anti-infectives: Anti-infectives    Start     Dose/Rate Route Frequency Ordered Stop   03/05/17 0645  fluconazole (DIFLUCAN) tablet 200 mg     200 mg Oral  Once 03/05/17 0636 03/05/17 0657   02/16/17 1500  ceFAZolin (ANCEF) IVPB 2g/100 mL premix     2 g 200 mL/hr over 30 Minutes Intravenous To Radiology 02/16/17 1447 02/16/17 1613   02/09/17 0600  erythromycin (EES) 400 MG/5ML suspension 200 mg     200 mg Oral Every 6 hours 02/09/17 0147     02/07/17 0000  erythromycin (EES) 400 MG/5ML suspension 400 mg  Status:  Discontinued     400 mg Oral Every 6 hours 02/07/17 1932 02/09/17 0147   02/06/17 0600   erythromycin ethylsuccinate (EES) 200 MG/5ML suspension 400 mg  Status:  Discontinued     400 mg Oral Every 6 hours 02/06/17 0324 02/07/17 1932      Assessment/Plan: Gastroparesis post-Whipple - SIPS and chips - continue TPN for prolonged ileus/protein calorie malnutrition until tube feeds at goal.- Con't Gtube port to gravity    Tube feeds at 5 ml/hr.    Large L rectus hematoma  improving       - from anticoagulation. Stopped Lovenox.       - Hb stable     LLE DVT       - lovenox held       - IVC filter placed 7/18  PT/OT Placement next week once tolerating tube feeds.     LOS: 37 days    Kaylee Mullins 03/15/2017

## 2017-03-15 NOTE — Clinical Social Work Note (Signed)
Pt continuing to receive tube feeds and TNA. CSW continues to follow for discharge needs.   [name], [place], [place] Work [phone]

## 2017-03-15 NOTE — Progress Notes (Signed)
Physical Therapy Treatment Patient Details Name: Kaylee Mullins MRN: [id number] DOB: 04-12-1936 Today's Date: 03/15/2017    History of Present Illness Pt is a 81 y/o female who was dishcharged and readmitted following new onset nausea at SNF. Pt is s/p whipple placement from previous admission, and had GJ tube placed this admission. Pt with LLE DVT and L abdomen intramuscular hematoma; lovenox discontinued and supposed to have IV filter placed today or tomorrow. PMH includes anxiety, thyroid cancer, pancreatic mass s/p whipple, DM, HTN, CVA with L sided weakness, and s/p lumbar surgery.     PT Comments    Pt tolerated Tx well. Pt Min Guard/Supervision for safety and requires few VC's for bed mobility, transfers and gait training. Next Tx to increase gait distance and perform LE exercises.  Follow Up Recommendations  SNF     Equipment Recommendations  3in1 (PT)    Recommendations for Other Services       Precautions / Restrictions Precautions Precautions: Fall Precaution Comments: GJ tube  Restrictions Weight Bearing Restrictions: No    Mobility  Bed Mobility Overal bed mobility: Needs Assistance Bed Mobility: Sit to Supine       Sit to supine: Min guard;HOB elevated   General bed mobility comments: Pt required min guard for safety getting into bed, VC's for hand placement to assist in scooting up in bed  Transfers Overall transfer level: Needs assistance Equipment used: Rolling walker (2 wheeled) Transfers: Sit to/from Stand Sit to Stand: Min guard;Supervision         General transfer comment: Supervision for safety, VC's for hand placement and technique  Ambulation/Gait Ambulation/Gait assistance: Min guard Ambulation Distance (Feet): 140 Feet Assistive device: Rolling walker (2 wheeled) Gait Pattern/deviations: Step-through pattern;Narrow base of support Gait velocity: normal   General Gait Details: Min guard for safety. VC's for technique with RW, for  heel strike/toe off and to point toes forward    Stairs            Wheelchair Mobility    Modified Rankin (Stroke Patients Only)       Balance Overall balance assessment: Needs assistance Sitting-balance support: Feet supported;No upper extremity supported Sitting balance-Leahy Scale: Good     Standing balance support: No upper extremity supported;During functional activity Standing balance-Leahy Scale: Good                              Cognition Arousal/Alertness: Awake/alert Behavior During Therapy: WFL for tasks assessed/performed Overall Cognitive Status: Within Functional Limits for tasks assessed                                        Exercises      General Comments        Pertinent Vitals/Pain Pain Assessment: Faces Faces Pain Scale: Hurts little more Pain Location: abdomen Pain Descriptors / Indicators: Aching;Sore Pain Intervention(s): Limited activity within patient's tolerance;Monitored during session;Premedicated before session;Repositioned    Home Living                      Prior Function            PT Goals (current goals can now be found in the care plan section) Acute Rehab PT Goals Patient Stated Goal: to get better  PT Goal Formulation: With patient Potential to Achieve Goals: Good Progress towards PT  goals: Progressing toward goals    Frequency    Min 2X/week      PT Plan Current plan remains appropriate    Co-evaluation              AM-PAC PT "6 Clicks" Daily Activity  Outcome Measure  Difficulty turning over in bed (including adjusting bedclothes, sheets and blankets)?: None Difficulty moving from lying on back to sitting on the side of the bed? : A Little Difficulty sitting down on and standing up from a chair with arms (e.g., wheelchair, bedside commode, etc,.)?: A Little Help needed moving to and from a bed to chair (including a wheelchair)?: A Little Help needed walking  in hospital room?: A Little Help needed climbing 3-5 steps with a railing? : A Lot 6 Click Score: 18    End of Session Equipment Utilized During Treatment: Gait belt Activity Tolerance: Patient tolerated treatment well Patient left: in bed;with call bell/phone within reach Nurse Communication: Mobility status PT Visit Diagnosis: Difficulty in walking, not elsewhere classified (R26.2);Muscle weakness (generalized) (M62.81);Pain     Time: 1352-1425 PT Time Calculation (min) (ACUTE ONLY): 33 min  Charges:  $Gait Training: 8-22 mins $Therapeutic Activity: 8-22 mins                    G Codes:       Harlon Kutner, SPTA [id number]   Gene Glazebrook 03/15/2017, 4:11 PM

## 2017-03-16 LAB — BASIC METABOLIC PANEL
Anion gap: 5 (ref 5–15)
BUN: 11 mg/dL (ref 6–20)
CO2: 25 mmol/L (ref 22–32)
Calcium: 7.7 mg/dL — ABNORMAL LOW (ref 8.9–10.3)
Chloride: 107 mmol/L (ref 101–111)
Creatinine, Ser: 0.75 mg/dL (ref 0.44–1.00)
GFR calc Af Amer: >60 mL/min (ref >60)
GFR calc non Af Amer: >60 mL/min (ref >60)
Glucose, Bld: 109 mg/dL — ABNORMAL HIGH (ref 65–99)
Potassium: 3.8 mmol/L (ref 3.5–5.1)
Sodium: 137 mmol/L (ref 135–145)

## 2017-03-16 LAB — GLUCOSE, CAPILLARY
Glucose-Capillary: 111 mg/dL — ABNORMAL HIGH (ref 65–99)
Glucose-Capillary: 165 mg/dL — ABNORMAL HIGH (ref 65–99)
Glucose-Capillary: 121 mg/dL — ABNORMAL HIGH (ref 65–99)

## 2017-03-16 MED ORDER — POTASSIUM CHLORIDE 20 MEQ/15ML (10%) PO SOLN
30.0000 meq | ORAL | Status: AC
  Administered 2017-03-16 (×2): 30 meq
  Filled 2017-03-16 (×2): qty 30

## 2017-03-16 MED ORDER — POTASSIUM CHLORIDE 20 MEQ/15ML (10%) PO SOLN: 30.0000 meq | ORAL | Status: DC

## 2017-03-16 MED ORDER — TRACE MINERALS CR-CU-MN-SE-ZN 10-1000-500-60 MCG/ML IV SOLN
INTRAVENOUS | Status: AC
  Administered 2017-03-16: 18:00:00 via INTRAVENOUS
  Filled 2017-03-16: qty 960

## 2017-03-16 MED ORDER — FAT EMULSION 20 % IV EMUL
240.0000 mL | INTRAVENOUS | Status: AC
  Administered 2017-03-16: 240 mL via INTRAVENOUS
  Filled 2017-03-16: qty 250

## 2017-03-16 NOTE — Progress Notes (Signed)
PHARMACY - ADULT TOTAL PARENTERAL NUTRITION CONSULT NOTE   Pharmacy Consult:  TPN Indication: Ileus  Patient Measurements: Height: 5\' 4"  (162.6 cm) Weight: 239 lb 4.8 oz (108.5 kg) IBW/kg (Calculated) : 54.7 TPN AdjBW (KG): 66.1 Body mass index is 41.08 kg/m.  Assessment:  51 YOF with severe malnutrition related to renal cell and pancreatic cancer who presented with abdominal mass on 01/12/17.  She is s/p Whipple procedure on the same day for pancreatic cancer.  She was discharged 02/05/17 and readmitted same day for vomiting at SNF. Pharmacy consulted to manage TPN for ileus that prevented oral intake and caused significant nausea and vomiting.   GI: Gastroparesis s/p Whipple. S/p GJ 7/10, malpositioned, to be replaced with surgical FT. J tube placed 7/25. Tube feeds started 7/26 at 58ml/hr. Did try and increase to goal but patient complained of abdominal tightness. Has felt nauseated and bloated off and on so TFs have been held intermittently. Vomiting this am so TFs are off for a few hours. Albumin low but up to 2.2. Prealbumin low but up to 14.3. Drain OP yesterday was 125 mL. Last BM was on 7/29. Erythromycin for gastroparesis, PPI IV, PRN Zofran/Phenergan (QTc 416ms on 6/17). Endo: Hypothyroid on Synthroid.  DM not on med PTA. CBGs mostly controlled (100-180s) Spike on CBGs on 7/25 PM was likely related to Dexamethasone 10mg  dose given and had another spike of 216 yesterday. Insulin requirements in the past 24 hours: 5 units SSI + 40 units in TPN Lytes: wnl exc K low but up to 3.8 (goal > 4 with ileus) Phos ok and Mg low at 1.8 (goal > 2 with ileus) CoCa 8.7. Phos wnl.   Renal: AKI resolved. SCr wnl. BUN down 17. UOP was 0.62ml/kg/hr. NS at 45ml/hr. Pulm: stable on RA  Cards: HTN - BP up, HR wnl - Norvasc, Toprol, PRN nifedipine XL  Hepatobil:LFTs wnl and Tbili back down to wnl. 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 Afebrile, WBC wnl. AC: (+)VTE.  CT (+) large rectus hematoma.  Lovenox stopped 7/17.  IVC filter placed 7/18. Hgb low but stable at 8.2 (last transfusion 7/19), Pltc wnl  Best Practices: SCDs  TPN Access: Double lumen PICC 6/19 >> TPN start date: 01/23/17   Nutritional Goals (per RD recommendation on 7/26): 1800-1950 kCal 82-93 g protein  Current Nutrition: Osmolite 1.5 Cal at 20 mL/hr (30 g of protein and 720 kcal) Prostate 62mL daily (15 g of protein and 100 calories) TPN + IV lipid emulsion   Plan:   Continue Clinimix E 5/15 at 40 ml/hr (May need to increase back up if continues to have trouble with TFs) Continue IV lipid emulsions at 12ml/hr over 12 hrs Continue Osmolite 1.5 cal at 52ml/hr as able. Advance to goal as tolerated Continue Prostat 55mL daily  Continue NS at 75ml/hr. TPN, IV lipid emulsion, TF, and Prostat provide 93 g of protein and 1982 kcal per day meeting 100% of protein and kCal needs. Add MVI in TPN Add TE in TPN every other day, next 7/31 Continue 40 units of regular insulin in TPN Continue moderate Q6h SSI and adjust as needed Monitor TPN labs F/U ability to advance TFs to goal and weaning off of TPN  Give potassium 60mEq PO solution per tube x 2 doses again today   Elenor Quinones, PharmD, [facility] Clinical Pharmacist Pager [phone] 03/16/2017 7:42 AM

## 2017-03-16 NOTE — Progress Notes (Signed)
Pt vomiting. Will turn TF off for a few hours.

## 2017-03-16 NOTE — Progress Notes (Signed)
6 Days Post-Op   Subjective/Chief Complaint: Had several bowel movements and threw up.  Less bloated now, but was nauseated earlier.  Staying on 5 ml/hr.    Objective: Vital signs in last 24 hours: Temp:  [99.1 F (37.3 C)-99.5 F (37.5 C)] 99.5 F (37.5 C) (07/31 2058) Pulse Rate:  [73-78] 78 (07/31 2058) Resp:  [17-20] 20 (07/31 1062) BP: (145-181)/(60-66) 181/66 (07/31 2058) SpO2:  [98 %-100 %] 100 % (07/31 2058) Last BM Date: 03/15/17  Intake/Output from previous day: 07/30 0701 - 07/31 0700 In: 3198.8 [I.V.:2043.8; NG/GT:985] Out: 1325 [Urine:1200; Drains:125] Intake/Output this shift: No intake/output data recorded.  Alert and cooperative Unlabored respirations Abdomen much softer, appropriately tender. G tube in place to gravity bag, J tube in place.  Skin is warm and dry   Lab Results:   Recent Labs  03/15/17 0336  WBC 10.2  HGB 8.2*  HCT 26.1*  PLT 436*   BMET  Recent Labs  03/15/17 0336 03/16/17 0457  NA 138 137  K 3.5 3.8  CL 107 107  CO2 25 25  GLUCOSE 117* 109*  BUN 11 11  CREATININE 0.77 0.75  CALCIUM 7.7* 7.7*   PT/INR No results for input(s): LABPROT, INR in the last 72 hours. ABG No results for input(s): PHART, HCO3 in the last 72 hours.  Invalid input(s): PCO2, PO2  Studies/Results: No results found.  Anti-infectives: Anti-infectives    Start     Dose/Rate Route Frequency Ordered Stop   03/05/17 0645  fluconazole (DIFLUCAN) tablet 200 mg     200 mg Oral  Once 03/05/17 0636 03/05/17 0657   02/16/17 1500  ceFAZolin (ANCEF) IVPB 2g/100 mL premix     2 g 200 mL/hr over 30 Minutes Intravenous To Radiology 02/16/17 1447 02/16/17 1613   02/09/17 0600  erythromycin (EES) 400 MG/5ML suspension 200 mg     200 mg Oral Every 6 hours 02/09/17 0147     02/07/17 0000  erythromycin (EES) 400 MG/5ML suspension 400 mg  Status:  Discontinued     400 mg Oral Every 6 hours 02/07/17 1932 02/09/17 0147   02/06/17 0600  erythromycin ethylsuccinate  (EES) 200 MG/5ML suspension 400 mg  Status:  Discontinued     400 mg Oral Every 6 hours 02/06/17 0324 02/07/17 1932      Assessment/Plan: Gastroparesis post-Whipple - SIPS and chips - continue TPN for prolonged ileus/protein calorie malnutrition until tube feeds at goal.- Con't Gtube port to gravity    Tube feeds at 5 ml/hr.    Large L rectus hematoma  improving       - from anticoagulation. Stopped Lovenox.       - Hb stable     LLE DVT       - lovenox held       - IVC filter placed 7/18  PT/OT Placement next week once tolerating tube feeds.     LOS: 38 days    Kaylee Mullins 03/16/2017

## 2017-03-16 NOTE — Progress Notes (Signed)
Nutrition Follow-up  DOCUMENTATION CODES:   Non-severe (moderate) malnutrition in context of acute illness/injury, Obesity unspecified  INTERVENTION:   -Continue Osmolite 1.5 @ 5 ml/hr via j-port. Recommend slowly increase to goal rate of 50 ml/hr.  -Continue 30 ml Prostat daily -Complete TF regimen at goal rate to provide 1900 kcal (100% of needs), 90 grams of protein, and 914 ml of H2O.  -TPN management per pharmacy  NUTRITION DIAGNOSIS:   Malnutrition related to chronic illness, acute illness as evidenced by mild depletion of muscle mass, mild depletion of body fat.  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 Had prolonged postop course due to delayed gastric emptying due to gastroparesis and intolerance of TNA, ultimately started on TPN. Represents from SNF after being discharged for less than 12 hours, due to vomiting and abdominal pain.   6/27- s/p endoscopy, which revealed normal esophagus, gastrojejunostmy with healthy appearing mucosa, and stomach with a lot of bilious fluid 6/27- NGT placed 6/30- per [facility] notes, unobstructed outflow from stomach, but no motility (outflow seems gravity dependent) 7/10- GJ tube placed, however, tip of tube in the stomach 7/17- CT revealed large L rectus hematoma 7/18- IVC filter placed 7/25 - J tube surgically placed 7/26- TF started  Case discussed with RN, who reports pt continues to have multiple episodes of vomiting when receiving TF. Most recent episode of vomiting this AM was orange and ylelow in color. She confirms G-port remains to gravity (noted 75 ml output today). J-tube used for feeding and medications.   RN report pt is episode of  vomiting earlier this AM. Osmolite 1.5 currently infusing at 5 ml/hr via j-port (along with 30 ml Prostat daily, providing 280 kcals, 23 grams protein, and 91 ml free water).   Pt remains TPN dependent. Currently receiving Clinimix E 5/15 at 40 ml/hr and IV lipid emulsions at 93ml/hr over 12 hrs (providing 1162 kcals and 48 grams protein). Complete nutrition support regimen provides 1442 kcals and 71 grams protein, meeting 80% of estimated kcal needs and 87% of estimated protein needs).   Spoke with pt, who denies any nausea or vomiting since TF restarted this AM. She says that she "feels like something is going through there". She endorses some abdominal pain, which has improved.   Labs reviewed: CBGS: 106-165.   Diet Order:  Diet NPO time specified Except for: Sips with Meds TPN (CLINIMIX-E) Adult TPN (CLINIMIX-E) Adult  Skin:  Wound (see comment) (closed abdominal incision)  Last BM:  03/15/17  Height:   Ht Readings from Last 1 Encounters:  03/10/17 5\' 4"  (1.626 m)    Weight:   Wt Readings from Last 1 Encounters:  03/14/17 239 lb 4.8 oz (108.5 kg)    Ideal Body Weight:  54.54 kg  BMI:  Body mass index is 41.08 kg/m.  Estimated Nutritional Needs:   Kcal:  1800-1950 (20-22 kcal/kg bw)  Protein:  82-93g Pro (1.5-1.7g/kg ibw)  Fluid:  1.8-2 L fluid (1 ml/kcal)  EDUCATION NEEDS:   No education needs identified at this time  Khalee Mazo A. Jimmye [name], RD, LDN, CDE Pager: [phone] After hours Pager: [phone]

## 2017-03-16 NOTE — Care Management Important Message (Signed)
Important Message  Patient Details  Name: Kaylee Mullins MRN: [id number] Date of Birth: 11/03/1935   Medicare Important Message Given:  Yes    Orbie Pyo 03/16/2017, 8:22 AM

## 2017-03-17 LAB — GLUCOSE, CAPILLARY
Glucose-Capillary: 106 mg/dL — ABNORMAL HIGH (ref 65–99)
Glucose-Capillary: 153 mg/dL — ABNORMAL HIGH (ref 65–99)
Glucose-Capillary: 156 mg/dL — ABNORMAL HIGH (ref 65–99)
Glucose-Capillary: 168 mg/dL — ABNORMAL HIGH (ref 65–99)

## 2017-03-17 MED ORDER — TRACE MINERALS CR-CU-MN-SE-ZN 10-1000-500-60 MCG/ML IV SOLN
INTRAVENOUS | Status: AC
  Administered 2017-03-17: 18:00:00 via INTRAVENOUS
  Filled 2017-03-17: qty 960

## 2017-03-17 MED ORDER — FAT EMULSION 20 % IV EMUL
240.0000 mL | INTRAVENOUS | Status: AC
  Administered 2017-03-17: 240 mL via INTRAVENOUS
  Filled 2017-03-17: qty 250

## 2017-03-17 MED ORDER — OSMOLITE 1.5 CAL PO LIQD
1000.0000 mL | ORAL | Status: DC
  Administered 2017-03-17: 1000 mL via JEJUNOSTOMY
  Filled 2017-03-17 (×3): qty 1000

## 2017-03-17 NOTE — Progress Notes (Signed)
7 Days Post-Op   Subjective/Chief Complaint: No vomiting overnight, but still having some retching.  Had several large bowel movements yesterday.    Objective: Vital signs in last 24 hours: Temp:  [99.1 F (37.3 C)-99.5 F (37.5 C)] 99.5 F (37.5 C) (07/31 2058) Pulse Rate:  [75-78] 78 (07/31 2058) Resp:  [17-20] 20 (07/31 6811) BP: (145-181)/(62-66) 181/66 (07/31 2058) SpO2:  [98 %-100 %] 100 % (07/31 2058) Last BM Date: 03/16/17  Intake/Output from previous day: 07/31 0701 - 08/01 0700 In: 619 [I.V.:619] Out: 500 [Urine:500] Intake/Output this shift: Total I/O In: 0  Out: 400 [Urine:400]  Alert and cooperative Unlabored respirations Abdomen much softer, appropriately tender. G tube in place to gravity bag, J tube in place.  Skin is warm and dry   Lab Results:   Recent Labs  03/15/17 0336  WBC 10.2  HGB 8.2*  HCT 26.1*  PLT 436*   BMET  Recent Labs  03/15/17 0336 03/16/17 0457  NA 138 137  K 3.5 3.8  CL 107 107  CO2 25 25  GLUCOSE 117* 109*  BUN 11 11  CREATININE 0.77 0.75  CALCIUM 7.7* 7.7*   PT/INR No results for input(s): LABPROT, INR in the last 72 hours. ABG No results for input(s): PHART, HCO3 in the last 72 hours.  Invalid input(s): PCO2, PO2  Studies/Results: No results found.  Anti-infectives: Anti-infectives    Start     Dose/Rate Route Frequency Ordered Stop   03/05/17 0645  fluconazole (DIFLUCAN) tablet 200 mg     200 mg Oral  Once 03/05/17 0636 03/05/17 0657   02/16/17 1500  ceFAZolin (ANCEF) IVPB 2g/100 mL premix     2 g 200 mL/hr over 30 Minutes Intravenous To Radiology 02/16/17 1447 02/16/17 1613   02/09/17 0600  erythromycin (EES) 400 MG/5ML suspension 200 mg     200 mg Oral Every 6 hours 02/09/17 0147     02/07/17 0000  erythromycin (EES) 400 MG/5ML suspension 400 mg  Status:  Discontinued     400 mg Oral Every 6 hours 02/07/17 1932 02/09/17 0147   02/06/17 0600  erythromycin ethylsuccinate (EES) 200 MG/5ML suspension  400 mg  Status:  Discontinued     400 mg Oral Every 6 hours 02/06/17 0324 02/07/17 1932      Assessment/Plan: Gastroparesis post-Whipple - SIPS and chips - continue TPN for prolonged ileus/protein calorie malnutrition until tube feeds at goal.- Con't Gtube port to gravity    Tube feeds up to 10 ml/hr.  Hopefully this decrease in nausea and increase in BMs will be accompanied by better tolerance of feeds.  Hope to go up more tomorrow.    Large L rectus hematoma  improving       - from anticoagulation. Stopped Lovenox.       - Hb stable     LLE DVT       - lovenox held       - IVC filter placed 7/18  PT/OT Placement next week once tolerating tube feeds.     LOS: 39 days    Belva Koziel 03/17/2017

## 2017-03-17 NOTE — Progress Notes (Signed)
Occupational Therapy Treatment Patient Details Name: Kaylee Mullins MRN: [id number] DOB: 08-21-1935 Today's Date: 03/17/2017    History of present illness Pt is a 81 y/o female who was dishcharged and readmitted following new onset nausea at SNF. Pt is s/p whipple placement from previous admission, and had GJ tube placed this admission. Pt with LLE DVT and L abdomen intramuscular hematoma; lovenox discontinued and supposed to have IV filter placed today or tomorrow. PMH includes anxiety, thyroid cancer, pancreatic mass s/p whipple, DM, HTN, CVA with L sided weakness, and s/p lumbar surgery.    OT comments  Pt making good progress with functional goals. Acute OT will continue to follow  Follow Up Recommendations  SNF;Supervision/Assistance - 24 hour    Equipment Recommendations  None recommended by OT    Recommendations for Other Services      Precautions / Restrictions Precautions Precautions: Fall Precaution Comments: GJ tube  Restrictions Weight Bearing Restrictions: No       Mobility Bed Mobility Overal bed mobility: Needs Assistance         Sit to supine: Supervision   General bed mobility comments: Pt required min guard for safety getting into bed, VC's for hand placement to assist in scooting up in bed  Transfers Overall transfer level: Needs assistance Equipment used: Rolling walker (2 wheeled) Transfers: Sit to/from Stand Sit to Stand: Min guard;Supervision   Squat pivot transfers: Min guard     General transfer comment: Supervision for safety, VC's for hand placement and technique    Balance Overall balance assessment: Needs assistance Sitting-balance support: Feet supported;No upper extremity supported Sitting balance-Leahy Scale: Good     Standing balance support: No upper extremity supported;During functional activity Standing balance-Leahy Scale: Good Standing balance comment: able to bathe LB and  pull pants over hips with min guard A for steady  assist                            ADL either performed or assessed with clinical judgement   ADL Overall ADL's : Needs assistance/impaired     Grooming: Standing;Min guard;Oral care       Lower Body Bathing: Sit to/from stand;Min guard       Lower Body Dressing: Sit to/from stand;Min guard   Toilet Transfer: Min guard;Ambulation;RW;Comfort height toilet   Toileting- Clothing Manipulation and Hygiene: Min guard;Sit to/from stand       Functional mobility during ADLs: Min guard;Rolling walker       Vision Patient Visual Report: No change from baseline            Cognition Arousal/Alertness: Awake/alert Behavior During Therapy: WFL for tasks assessed/performed Overall Cognitive Status: Within Functional Limits for tasks assessed                                                      General Comments  pt pleasant and cooperative    Pertinent Vitals/ Pain       Pain Score: 3  Pain Location: abdomen Pain Descriptors / Indicators: Aching;Sore Pain Intervention(s): Limited activity within patient's tolerance;Monitored during session;Premedicated before session;Repositioned  Home Living  Prior Functioning/Environment              Frequency  Min 2X/week        Progress Toward Goals  OT Goals(current goals can now be found in the care plan section)  Progress towards OT goals: Progressing toward goals     Plan Discharge plan remains appropriate    Co-evaluation                 AM-PAC PT "6 Clicks" Daily Activity     Outcome Measure   Help from another person eating meals?: None Help from another person taking care of personal grooming?: A Little Help from another person toileting, which includes using toliet, bedpan, or urinal?: A Little Help from another person bathing (including washing, rinsing, drying)?: A Little Help from another person to put on  and taking off regular upper body clothing?: A Little Help from another person to put on and taking off regular lower body clothing?: A Little 6 Click Score: 19    End of Session Equipment Utilized During Treatment: Gait belt;Rolling walker  OT Visit Diagnosis: Unsteadiness on feet (R26.81);Pain Pain - Right/Left:  (abdomen) Pain - part of body:  (abdomen)   Activity Tolerance Patient tolerated treatment well   Patient Left in bed;with call bell/phone within reach   Nurse Communication      Functional Assessment Tool Used: AM-PAC 6 Clicks Daily Activity   Time: 6295-2841 OT Time Calculation (min): 24 min  Charges: OT G-codes **NOT FOR INPATIENT CLASS** Functional Assessment Tool Used: AM-PAC 6 Clicks Daily Activity OT General Charges $OT Visit: 1 Procedure OT Treatments $Self Care/Home Management : 8-22 mins $Therapeutic Activity: 8-22 mins     Britt Bottom 03/17/2017, 11:28 AM

## 2017-03-17 NOTE — Progress Notes (Signed)
PHARMACY - ADULT TOTAL PARENTERAL NUTRITION CONSULT NOTE   Pharmacy Consult:  TPN Indication: Ileus  Patient Measurements: Height: 5\' 4"  (162.6 cm) Weight: 239 lb 4.8 oz (108.5 kg) IBW/kg (Calculated) : 54.7 TPN AdjBW (KG): 66.1 Body mass index is 41.08 kg/m.  Assessment:  35 YOF with severe malnutrition related to renal cell and pancreatic cancer who presented with abdominal mass on 01/12/17.  She is s/p Whipple procedure on the same day for pancreatic cancer.  She was discharged 02/05/17 and readmitted same day for vomiting at SNF. Pharmacy consulted to manage TPN for ileus that prevented oral intake and caused significant nausea and vomiting.   GI: Gastroparesis s/p Whipple. S/p GJ 7/10, malpositioned, to be replaced with surgical FT. J tube placed 7/25. Tube feeds started 7/26 at 54ml/hr. Did try and increase to goal but patient complained of abdominal tightness. Has felt nauseated and bloated off and on so TFs have been held intermittently. Vomiting yesterday and TFs down to 30ml/hr. Albumin low but up to 2.2. Prealbumin low but up to 14.3. No Drain OP yesterday. Last BM was on 7/30. Erythromycin for gastroparesis, PPI IV, PRN Zofran/Phenergan (QTc 422ms on 6/17). Endo: Hypothyroid on Synthroid.  DM not on med PTA. CBGs now controlled (100-160s) Spike on CBGs on 7/25 PM was likely related to Dexamethasone 10mg  dose given and had another spike of 216 on 7/31.  Insulin requirements in the past 24 hours: 6 units SSI + 40 units in TPN Lytes: wnl exc K low at 3.8 but some replacement given (goal > 4 with ileus) heck TPN labs tomorrow for improvement. Phos ok and Mg low at 1.8 but replaced (goal > 2 with ileus) CoCa 8.7. Phos wnl.   Renal: AKI resolved. SCr wnl. BUN down 17. UOP low. NS at 62ml/hr. Pulm: stable on RA  Cards: HTN - BP up, HR wnl - Norvasc, Toprol, PRN nifedipine XL  Hepatobil:LFTs wnl and Tbili back down to wnl. Trig wnl at 95 Neuro: hx CVA 2016 with residual L-hand and foot  weakness - PRN Dilaudid/oxy ID: Nystatin for thrush- patient refusing. Afebrile, WBC wnl. AC: (+)VTE.  CT (+) large rectus hematoma.  Lovenox stopped 7/17.  IVC filter placed 7/18. Hgb low but stable at 8.2 (last transfusion 7/19), Pltc wnl  Best Practices: SCDs  TPN Access: Double lumen PICC 6/19 >> TPN start date: 01/23/17   Nutritional Goals (per RD recommendation on 7/26): 1800-1950 kCal 82-93 g protein  Current Nutrition: Osmolite 1.5 Cal at 5 mL/hr (8 g of protein and 180 kcal) Prostate 33mL daily (15 g of protein and 100 calories) TPN + IV lipid emulsion   Plan:   Continue Clinimix E 5/15 at 40 ml/hr (May need to increase back up if continues to have trouble with TFs, will consider increasing on 8/2 if TFs not advanced) Continue IV lipid emulsions at 47ml/hr over 12 hrs Continue Osmolite 1.5 cal at 82ml/hr per surgery. Advance to goal as tolerated Continue Prostat 51mL daily  Continue NS at 65ml/hr. TPN, IV lipid emulsion, TF, and Prostat provide 71 g of protein and 1,442 kcal per day meeting ~90% of protein needs and ~80% of kCal needs. Add MVI and trace elements in TPN Continue 40 units of regular insulin in TPN Continue moderate Q6h SSI and adjust as needed Monitor TPN labs F/U ability to advance TFs to goal and weaning off of TPN   Elenor Quinones, PharmD, [facility] Clinical Pharmacist Pager [phone] 03/17/2017 7:32 AM

## 2017-03-18 LAB — COMPREHENSIVE METABOLIC PANEL
ALT: 22 U/L (ref 14–54)
AST: 23 U/L (ref 15–41)
Albumin: 2.2 g/dL — ABNORMAL LOW (ref 3.5–5.0)
Alkaline Phosphatase: 106 U/L (ref 38–126)
Anion gap: 3 — ABNORMAL LOW (ref 5–15)
BUN: 11 mg/dL (ref 6–20)
CO2: 25 mmol/L (ref 22–32)
Calcium: 7.7 mg/dL — ABNORMAL LOW (ref 8.9–10.3)
Chloride: 107 mmol/L (ref 101–111)
Creatinine, Ser: 0.81 mg/dL (ref 0.44–1.00)
GFR calc Af Amer: >60 mL/min (ref >60)
GFR calc non Af Amer: >60 mL/min (ref >60)
Glucose, Bld: 129 mg/dL — ABNORMAL HIGH (ref 65–99)
Potassium: 3.7 mmol/L (ref 3.5–5.1)
Sodium: 135 mmol/L (ref 135–145)
Total Bilirubin: 1 mg/dL (ref 0.3–1.2)
Total Protein: 6.5 g/dL (ref 6.5–8.1)

## 2017-03-18 LAB — GLUCOSE, CAPILLARY
Glucose-Capillary: 125 mg/dL — ABNORMAL HIGH (ref 65–99)
Glucose-Capillary: 154 mg/dL — ABNORMAL HIGH (ref 65–99)
Glucose-Capillary: 108 mg/dL — ABNORMAL HIGH (ref 65–99)
Glucose-Capillary: 133 mg/dL — ABNORMAL HIGH (ref 65–99)
Glucose-Capillary: 128 mg/dL — ABNORMAL HIGH (ref 65–99)

## 2017-03-18 LAB — PHOSPHORUS: Phosphorus: 2.9 mg/dL (ref 2.5–4.6)

## 2017-03-18 LAB — MAGNESIUM: Magnesium: 1.8 mg/dL (ref 1.7–2.4)

## 2017-03-18 MED ORDER — POTASSIUM CHLORIDE 20 MEQ/15ML (10%) PO SOLN
30.0000 meq | ORAL | Status: AC
  Administered 2017-03-18 (×2): 30 meq via ORAL
  Filled 2017-03-18 (×2): qty 30

## 2017-03-18 MED ORDER — FAT EMULSION 20 % IV EMUL
240.0000 mL | INTRAVENOUS | Status: AC
  Administered 2017-03-18: 240 mL via INTRAVENOUS
  Filled 2017-03-18: qty 250

## 2017-03-18 MED ORDER — OSMOLITE 1.5 CAL PO LIQD
1000.0000 mL | ORAL | Status: DC
  Administered 2017-03-19: 1000 mL via JEJUNOSTOMY
  Filled 2017-03-18 (×2): qty 1000

## 2017-03-18 MED ORDER — MAGNESIUM SULFATE 50 % IJ SOLN
3.0000 g | Freq: Once | INTRAVENOUS | Status: AC
  Administered 2017-03-18: 3 g via INTRAVENOUS
  Filled 2017-03-18: qty 6

## 2017-03-18 MED ORDER — TRACE MINERALS CR-CU-MN-SE-ZN 10-1000-500-60 MCG/ML IV SOLN
INTRAVENOUS | Status: AC
  Administered 2017-03-18: 18:00:00 via INTRAVENOUS
  Filled 2017-03-18: qty 960

## 2017-03-18 NOTE — Progress Notes (Signed)
Nutrition Follow-up  DOCUMENTATION CODES:   Non-severe (moderate) malnutrition in context of acute illness/injury, Obesity unspecified  INTERVENTION:   -Continue Osmolite 1.5 @ 5 ml/hr via j-port. Recommend slowly increase to goal rate of 50 ml/hr.  -Continue 30 ml Prostat daily -Complete TF regimen at goal rate to provide 1900kcal (100% of needs), 90grams of protein, and 99ml of H2O.  -TPN management per pharmacy  NUTRITION DIAGNOSIS:   Malnutrition related to chronic illness, acute illness as evidenced by mild depletion of muscle mass, mild depletion of body fat.  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 Had prolonged postop course due to delayed gastric emptying due to gastroparesis and intolerance of TNA, ultimately started on TPN. Represents from SNF after being discharged for less than 12 hours, due to vomiting and abdominal pain.   6/27- s/p endoscopy, which revealed normal esophagus, gastrojejunostmy with healthy appearing mucosa, and stomach with a lot of bilious fluid 6/27- NGT placed 6/30- per [facility] notes, unobstructed outflow from stomach, but no motility (outflow seems gravity dependent) 7/10- GJ tube placed, however, tip of tube in the stomach 7/17- CT revealed large L rectus hematoma 7/18- IVC filter placed 7/25 - J tube surgically placed 7/26- TF started  Case discussed with RN, who reports pt continues to complain of nausea. No vomiting per RN report. G-port remains to gravity.   RN confirms TF advancement earlier today. Osmolite 1.5 currently infusing at 15 ml/hr via j-port (along with 30 ml Prostat daily, providing 640 kcals, 38 grams  protein, and 274 ml free water).   Pt remains TPN dependent. Currently receiving Clinimix E 5/15 at 40 ml/hr and IV lipid emulsions at 19ml/hr over 12 hrs (providing 1162 kcals and 48 grams protein). Complete nutrition support regimen provides 1802 kcals and 86 grams protein, meeting 100% of estimated kcal needs and 100% of estimated protein needs).   Labs reviewed: CBGS: 106-154.   Diet Order:  Diet NPO time specified Except for: Sips with Meds TPN (CLINIMIX-E) Adult TPN (CLINIMIX-E) Adult  Skin:  Wound (see comment) (closed abdominal incision)  Last BM:  03/17/17  Height:   Ht Readings from Last 1 Encounters:  03/10/17 5\' 4"  (1.626 m)    Weight:   Wt Readings from Last 1 Encounters:  03/18/17 236 lb 8.9 oz (107.3 kg)    Ideal Body Weight:  54.54 kg  BMI:  Body mass index is 40.6 kg/m.  Estimated Nutritional Needs:   Kcal:  1800-1950 (20-22 kcal/kg bw)  Protein:  82-93g Pro (1.5-1.7g/kg ibw)  Fluid:  1.8-2 L fluid (1 ml/kcal)  EDUCATION NEEDS:   No education needs identified at this time  Bedie Dominey A. Jimmye [name], RD, LDN, CDE Pager: [phone] After hours Pager: [phone]

## 2017-03-18 NOTE — Progress Notes (Signed)
8 Days Post-Op   Subjective/Chief Complaint: Had some nausea this AM. Tolerated tube feeds at 10 ml/hr.    Objective: Vital signs in last 24 hours: Temp:  [98 F (36.7 C)-100.7 F (38.2 C)] 98.1 F (36.7 C) (08/02 0625) Pulse Rate:  [82-92] 82 (08/02 0625) Resp:  [16-17] 17 (08/02 0625) BP: (155-163)/(60-68) 158/64 (08/02 0625) SpO2:  [97 %-99 %] 98 % (08/02 0625) Weight:  [107.3 kg (236 lb 8.9 oz)] 107.3 kg (236 lb 8.9 oz) (08/02 0625) Last BM Date: 03/17/17  Intake/Output from previous day: 08/01 0701 - 08/02 0700 In: 2834.2 [I.V.:2547.5; NG/GT:226.7; IV Piggyback:60] Out: 1350 [FOYDX:4128] Intake/Output this shift: Total I/O In: 0  Out: 400 [Urine:400]  Alert and cooperative Unlabored respirations Abdomen much softer, appropriately tender. G tube in place to gravity bag, J tube in place.  Skin is warm and dry   Lab Results:  No results for input(s): WBC, HGB, HCT, PLT in the last 72 hours. BMET  Recent Labs  03/16/17 0457 03/18/17 0337  NA 137 135  K 3.8 3.7  CL 107 107  CO2 25 25  GLUCOSE 109* 129*  BUN 11 11  CREATININE 0.75 0.81  CALCIUM 7.7* 7.7*   PT/INR No results for input(s): LABPROT, INR in the last 72 hours. ABG No results for input(s): PHART, HCO3 in the last 72 hours.  Invalid input(s): PCO2, PO2  Studies/Results: No results found.  Anti-infectives: Anti-infectives    Start     Dose/Rate Route Frequency Ordered Stop   03/05/17 0645  fluconazole (DIFLUCAN) tablet 200 mg     200 mg Oral  Once 03/05/17 0636 03/05/17 0657   02/16/17 1500  ceFAZolin (ANCEF) IVPB 2g/100 mL premix     2 g 200 mL/hr over 30 Minutes Intravenous To Radiology 02/16/17 1447 02/16/17 1613   02/09/17 0600  erythromycin (EES) 400 MG/5ML suspension 200 mg     200 mg Oral Every 6 hours 02/09/17 0147     02/07/17 0000  erythromycin (EES) 400 MG/5ML suspension 400 mg  Status:  Discontinued     400 mg Oral Every 6 hours 02/07/17 1932 02/09/17 0147   02/06/17 0600   erythromycin ethylsuccinate (EES) 200 MG/5ML suspension 400 mg  Status:  Discontinued     400 mg Oral Every 6 hours 02/06/17 0324 02/07/17 1932      Assessment/Plan: Gastroparesis post-Whipple - SIPS and chips - continue TPN for prolonged ileus/protein calorie malnutrition until tube feeds at goal.- Con't Gtube port to gravity    Tube feeds up to 15 ml/hr. G tube putting out less than it did before.  Hopefully we are turning a corner with better bowel and gastric function.    Large L rectus hematoma  improving       - from anticoagulation. Stopped Lovenox.       - Hb stable     LLE DVT       - lovenox held       - IVC filter placed 7/18  PT/OT Placement next week once tolerating tube feeds.     LOS: 40 days    Kaylee Mullins 03/18/2017

## 2017-03-18 NOTE — Progress Notes (Signed)
PHARMACY - ADULT TOTAL PARENTERAL NUTRITION CONSULT NOTE   Pharmacy Consult:  TPN Indication: Ileus  Patient Measurements: Height: 5\' 4"  (162.6 cm) Weight: 236 lb 8.9 oz (107.3 kg) IBW/kg (Calculated) : 54.7 TPN AdjBW (KG): 66.1 Body mass index is 40.6 kg/m.  Assessment:  35 YOF with severe malnutrition related to renal cell and pancreatic cancer who presented with abdominal mass on 01/12/17.  She is s/p Whipple procedure on the same day for pancreatic cancer.  She was discharged 02/05/17 and readmitted same day for vomiting at SNF. Pharmacy consulted to manage TPN for ileus that prevented oral intake and caused significant nausea and vomiting.   GI: s/p GJ 7/10, malpositioned, J tube placed 7/25.  Difficulty with advancing TF d/t abd tightness/N/V.  Albumin low but up to 2.2, prealbumin low but up to 14.3. No drain O/P, BM x3.  Erythromycin for gastroparesis, PPI IV, PRN Zofran/Phenergan (QTc 470ms on 6/17). Endo: Hypothyroid on Synthroid.  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  Tmax 100.7, WBC WNL. AC/Heme: LLE DVT but has large rectus hematoma.  Lovenox stopped 7/17.  IVC filter placed 7/18. Hgb low but stable at 8.2 (last transfusion 7/19), plts WNL Best Practices: SCDs  TPN Access: double lumen PICC 02/02/17 TPN start date: 01/23/17   Nutritional Goals (per RD recommendation on 7/26): 1800-1950 kCal and 82-93 g protein  Current Nutrition: Osmolite 1.5 increased to 15 ml/hr (20 g of protein and  540 kcal) Prostat 66mL daily (15 g of protein and 100 calories) TPN   Plan:   Continue Clinimix E 5/15 at 40 ml/hr + 20% ILE at 20 ml/hr x 12 hrs. Osmolite 1.5 at 15 ml/hr + Prostat 68mL daily per Surgery TPN + TF will provide 1802 kCal and 83 gm of protein per day, meeting 100% of patient's needs Daily multivitamin and trace elements in TPN Continue moderate SSI Q6H + 40 units regular insulin in TPN NS at 45 ml/hr per MD KCL 56mEq PT x 2 doses Mag sulfate 3gm IV x 1 F/U AM labs, watch CBGs and may need TF insulin coverage F/U ability to advance TFs to goal and wean off of TPN    Monty Mccarrell D. Mina Marble, PharmD, BCPS Pager:  [phone] 03/18/2017, 11:24 AM

## 2017-03-18 NOTE — Progress Notes (Signed)
When tube feeds were adjusted by MD [name] this a.m., the feedings were in the order to be started tomorrow 8/3 am, however I clarified her order and she wants her to be on 15 ml/hr today from when she initially placed the order (around 830am). I bumped up the patient's tube feeds from 10 ml/hr to 15 ml/hr , will continue to monitor.

## 2017-03-18 NOTE — Progress Notes (Signed)
Physical Therapy Treatment Patient Details Name: Kaylee Mullins MRN: [id number] DOB: 02/06/1936 Today's Date: 03/18/2017    History of Present Illness Pt is a 81 y/o female who was dishcharged and readmitted following new onset nausea at SNF. Pt is s/p whipple placement from previous admission, and had GJ tube placed this admission. Pt with LLE DVT and L abdomen intramuscular hematoma; lovenox discontinued and supposed to have IV filter placed today or tomorrow. PMH includes anxiety, thyroid cancer, pancreatic mass s/p whipple, DM, HTN, CVA with L sided weakness, and s/p lumbar surgery.     PT Comments    Pt tolerated Tx well with minimal cues. VC's for transfers and gait training. Pt was given BP and nausea medication during Tx. Pt was supervision/ min guard for transfers and gait and did well with LE exercises. Next Tx to increase gait distance and continue with exercises. 1 out of 3 goals met, goals revised today per PT.    Follow Up Recommendations  SNF     Equipment Recommendations  3in1 (PT)    Recommendations for Other Services       Precautions / Restrictions Precautions Precautions: Fall Precaution Comments: GJ tube  Restrictions Weight Bearing Restrictions: No    Mobility  Bed Mobility               General bed mobility comments: Pt was sitting EOB with nurse upon arrival  Transfers Overall transfer level: Needs assistance Equipment used: Rolling walker (2 wheeled) Transfers: Sit to/from Stand Sit to Stand: Min guard;Supervision         General transfer comment: Supervision for safety, VC's for hand placement   Ambulation/Gait Ambulation/Gait assistance: Min guard Ambulation Distance (Feet): 100 Feet (50, sitting rest break, 50) Assistive device: Rolling walker (2 wheeled) Gait Pattern/deviations: Step-through pattern;Decreased dorsiflexion - left;Decreased dorsiflexion - right;Trunk flexed Gait velocity: decreased   General Gait Details: Good  sequencing with RW, VC's for heel strike/toe off, for posture   Stairs            Wheelchair Mobility    Modified Rankin (Stroke Patients Only)       Balance Overall balance assessment: Needs assistance Sitting-balance support: Feet supported;No upper extremity supported Sitting balance-Leahy Scale: Good     Standing balance support: Bilateral upper extremity supported;During functional activity   Standing balance comment: No UE required with static standing, UE support required during gait                            Cognition Arousal/Alertness: Awake/alert Behavior During Therapy: WFL for tasks assessed/performed Overall Cognitive Status: Within Functional Limits for tasks assessed                                        Exercises General Exercises - Lower Extremity Ankle Circles/Pumps: AROM;Both;10 reps;Seated Long Arc Quad: AROM;Both;10 reps;Seated Heel Slides: AROM;Both;10 reps;Seated Hip ABduction/ADduction: AROM;Both;5 reps;Seated    General Comments        Pertinent Vitals/Pain Pain Assessment: Faces Faces Pain Scale: Hurts a little bit Pain Location: abdomen Pain Descriptors / Indicators: Discomfort Pain Intervention(s): Monitored during session;Limited activity within patient's tolerance;Repositioned    Home Living                      Prior Function  PT Goals (current goals can now be found in the care plan section) Acute Rehab PT Goals Patient Stated Goal: to get better  PT Goal Formulation: With patient Time For Goal Achievement: 04/01/17 Potential to Achieve Goals: Good Progress towards PT goals: Progressing toward goals    Frequency    Min 2X/week      PT Plan Current plan remains appropriate    Co-evaluation              AM-PAC PT "6 Clicks" Daily Activity  Outcome Measure  Difficulty turning over in bed (including adjusting bedclothes, sheets and blankets)?:  None Difficulty moving from lying on back to sitting on the side of the bed? : A Little Difficulty sitting down on and standing up from a chair with arms (e.g., wheelchair, bedside commode, etc,.)?: A Little Help needed moving to and from a bed to chair (including a wheelchair)?: A Little Help needed walking in hospital room?: A Little Help needed climbing 3-5 steps with a railing? : A Lot 6 Click Score: 18    End of Session Equipment Utilized During Treatment: Gait belt Activity Tolerance: Patient tolerated treatment well Patient left: in chair;with call bell/phone within reach Nurse Communication: Mobility status (Nurse administered meds (not for pain) during Tx) PT Visit Diagnosis: Difficulty in walking, not elsewhere classified (R26.2);Muscle weakness (generalized) (M62.81);Pain     Time: 0830-0902 PT Time Calculation (min) (ACUTE ONLY): 32 min  Charges:  $Gait Training: 8-22 mins $Therapeutic Exercise: 8-22 mins                    G Codes:       Kaylee Mullins, SPTA [id number]    Kaylee Mullins 03/18/2017, 9:40 AM

## 2017-03-18 NOTE — Care Management Important Message (Signed)
Important Message  Patient Details  Name: Kaylee Mullins MRN: [id number] Date of Birth: [birth year]   Medicare Important Message Given:  Yes    Makeshia Seat Abena 03/18/2017, 11:21 AM

## 2017-03-19 LAB — BASIC METABOLIC PANEL
Anion gap: 5 (ref 5–15)
BUN: 10 mg/dL (ref 6–20)
CO2: 25 mmol/L (ref 22–32)
Calcium: 7.8 mg/dL — ABNORMAL LOW (ref 8.9–10.3)
Chloride: 106 mmol/L (ref 101–111)
Creatinine, Ser: 0.8 mg/dL (ref 0.44–1.00)
GFR calc Af Amer: >60 mL/min (ref >60)
GFR calc non Af Amer: >60 mL/min (ref >60)
Glucose, Bld: 144 mg/dL — ABNORMAL HIGH (ref 65–99)
Potassium: 3.9 mmol/L (ref 3.5–5.1)
Sodium: 136 mmol/L (ref 135–145)

## 2017-03-19 LAB — GLUCOSE, CAPILLARY
Glucose-Capillary: 154 mg/dL — ABNORMAL HIGH (ref 65–99)
Glucose-Capillary: 128 mg/dL — ABNORMAL HIGH (ref 65–99)
Glucose-Capillary: 120 mg/dL — ABNORMAL HIGH (ref 65–99)

## 2017-03-19 LAB — MAGNESIUM: Magnesium: 2.4 mg/dL (ref 1.7–2.4)

## 2017-03-19 MED ORDER — OSMOLITE 1.5 CAL PO LIQD
1000.0000 mL | ORAL | Status: DC
  Administered 2017-03-21 – 2017-03-23 (×2): 1000 mL via JEJUNOSTOMY
  Filled 2017-03-19 (×6): qty 1000

## 2017-03-19 MED ORDER — ALTEPLASE 2 MG IJ SOLR
2.0000 mg | Freq: Once | INTRAMUSCULAR | Status: AC
  Administered 2017-03-19: 2 mg
  Filled 2017-03-19: qty 2

## 2017-03-19 MED ORDER — FAT EMULSION 20 % IV EMUL
240.0000 mL | INTRAVENOUS | Status: AC
  Administered 2017-03-19: 240 mL via INTRAVENOUS
  Filled 2017-03-19: qty 250

## 2017-03-19 MED ORDER — TRACE MINERALS CR-CU-MN-SE-ZN 10-1000-500-60 MCG/ML IV SOLN
INTRAVENOUS | Status: AC
  Administered 2017-03-19: 18:00:00 via INTRAVENOUS
  Filled 2017-03-19: qty 960

## 2017-03-19 NOTE — Progress Notes (Signed)
Increased patient's TF to 60ml/hr, will continue to monitor.

## 2017-03-19 NOTE — Clinical Social Work Note (Addendum)
CSW met with pt at bedside to reiterate discharge plan, as MD note states pt will d/c to SNF next week pending tolerance of tube feeds. CSW reminded pt that due to TNA pt has option of [facility] or [facility].   Pt states dtr confirmed [facility] pt with TNA. CSW called Narda Rutherford at [facility] who stated she is unsure who told pt's family that information as it is untrue. Janie confirmed [facility] WILL NOT be able to accept pt as long as she still has TNA. CSW updated pt with this information. Pt agreed to return to [facility]. CSW updated Carolynn Sayers of [facility] at [facility], who are ready and able to receive pt upon discharge. CSW continuing to follow for discharge needs.  [name], [place], [place] Work  [phone]

## 2017-03-19 NOTE — Progress Notes (Signed)
9 Days Post-Op   Subjective/Chief Complaint: Minimal change, but no worse.      Objective: Vital signs in last 24 hours: Temp:  [98.9 F (37.2 C)-100.8 F (38.2 C)] 98.9 F (37.2 C) (08/03 0634) Pulse Rate:  [79-83] 80 (08/03 0634) Resp:  [17-18] 18 (08/03 0634) BP: (149-166)/(57-61) 164/61 (08/03 0634) SpO2:  [99 %-100 %] 100 % (08/03 0634) Weight:  [106.8 kg (235 lb 7.2 oz)] 106.8 kg (235 lb 7.2 oz) (08/03 0634) Last BM Date: 03/16/17  Intake/Output from previous day: 08/02 0701 - 08/03 0700 In: 2279 [I.V.:1744; NG/GT:235] Out: 1825 [Urine:1550; Drains:275] Intake/Output this shift: No intake/output data recorded.  Alert and cooperative Unlabored respirations Abdomen soft appropriately tender. G tube in place to gravity bag, J tube in place.  Skin is warm and dry   Lab Results:  No results for input(s): WBC, HGB, HCT, PLT in the last 72 hours. BMET  Recent Labs  03/18/17 0337 03/19/17 0811  NA 135 136  K 3.7 3.9  CL 107 106  CO2 25 25  GLUCOSE 129* 144*  BUN 11 10  CREATININE 0.81 0.80  CALCIUM 7.7* 7.8*   PT/INR No results for input(s): LABPROT, INR in the last 72 hours. ABG No results for input(s): PHART, HCO3 in the last 72 hours.  Invalid input(s): PCO2, PO2  Studies/Results: No results found.  Anti-infectives: Anti-infectives    Start     Dose/Rate Route Frequency Ordered Stop   03/05/17 0645  fluconazole (DIFLUCAN) tablet 200 mg     200 mg Oral  Once 03/05/17 0636 03/05/17 0657   02/16/17 1500  ceFAZolin (ANCEF) IVPB 2g/100 mL premix     2 g 200 mL/hr over 30 Minutes Intravenous To Radiology 02/16/17 1447 02/16/17 1613   02/09/17 0600  erythromycin (EES) 400 MG/5ML suspension 200 mg     200 mg Oral Every 6 hours 02/09/17 0147     02/07/17 0000  erythromycin (EES) 400 MG/5ML suspension 400 mg  Status:  Discontinued     400 mg Oral Every 6 hours 02/07/17 1932 02/09/17 0147   02/06/17 0600  erythromycin ethylsuccinate (EES) 200 MG/5ML  suspension 400 mg  Status:  Discontinued     400 mg Oral Every 6 hours 02/06/17 0324 02/07/17 1932      Assessment/Plan: Gastroparesis post-Whipple - SIPS and chips - continue TPN for prolonged ileus/protein calorie malnutrition until tube feeds at goal.- Con't Gtube port to gravity    Tube feeds up to 20 ml/hr.advance to 25 ml/hr tonight if doing OK.  Would do increase by 5 ml/hr each 12 hours to goal if tolerating.     Large L rectus hematoma  improving       - from anticoagulation. Stopped Lovenox.       - Hb stable     LLE DVT       - lovenox held       - IVC filter placed 7/18  PT/OT Placement next week once tolerating tube feeds.     LOS: 41 days    Laira Penninger 03/19/2017

## 2017-03-19 NOTE — Progress Notes (Signed)
Patient had an episode of emesis around 1515, I assess the emesis and it had a yellow/milky color to it so I stopped tube feeds at 1520 and gave her some zofran and called the MD. I spoke with MD Rosendo Gros and notified him we did increase her TF from 15 ml/hr to 20 ml/hr this morning so he said hold it for 4 hours and then restart at 20 ml/hr and see how she does. I will restart at [place] and continue to monitor.

## 2017-03-19 NOTE — Progress Notes (Signed)
PHARMACY - ADULT TOTAL PARENTERAL NUTRITION CONSULT NOTE   Pharmacy Consult:  TPN Indication: Ileus  Patient Measurements: Height: 5\' 4"  (162.6 cm) Weight: 235 lb 7.2 oz (106.8 kg) IBW/kg (Calculated) : 54.7 TPN AdjBW (KG): 66.1 Body mass index is 40.42 kg/m.  Assessment:  76 YOF with severe malnutrition related to renal cell and pancreatic cancer who presented with abdominal mass on 01/12/17.  She is s/p Whipple procedure on the same day for pancreatic cancer.  She was discharged 02/05/17 and readmitted same day for vomiting at SNF. Pharmacy consulted to manage TPN for ileus that prevented oral intake and caused significant nausea and vomiting.   GI: s/p GJ 7/10, malpositioned, J tube placed 7/25.  Difficulty with advancing TF d/t abd tightness/N/V. Osmolite @ 5 ml/hr.  Albumin low but up to 2.2, prealbumin low but up to 14.3. No drain O/P, J-tube output 262ml. BM x3.  Erythromycin for gastroparesis, PPI IV, PRN Zofran/Phenergan (QTc 462ms on 6/17). Endo: Hypothyroid on Synthroid.  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  Tmax 100.7, WBC WNL. AC/Heme: LLE DVT but has large rectus hematoma.  Lovenox stopped 7/17.  IVC filter placed 7/18. Hgb low but stable at 8.2 (last transfusion 7/19), plts WNL Best Practices: SCDs  TPN Access: double lumen PICC 02/02/17 TPN start date: 01/23/17   Nutritional Goals (per RD recommendation on 8/2): 1800-1950 kCal and 82-93 g protein  Current  Nutrition: Osmolite 1.5 increased to 20 ml/hr this AM ( 30g of protein and 720 kcal)  *Goal 50 ml/hr (advancing slowly due to difficulty tolerating) Prostat 5mL daily (15 g of protein and 100 calories) TPN   Plan:   Continue Clinimix E 5/15 at 40 ml/hr + 20% ILE at 20 ml/hr x 12 hrs. Osmolite 1.5 at 20 ml/hr + Prostat 45mL daily per Surgery TPN + TF (if tolerates increase) will provide 1982 kCal and 33 gm of protein per day, meeting 100% of patient's needs Daily multivitamin and trace elements in TPN Continue moderate SSI Q6H + 40 units regular insulin in TPN NS at 45 ml/hr per MD F/U AM labs, watch CBGs and may need TF insulin coverage F/U ability to advance TFs to goal and wean off of TPN   Sloan Leiter, PharmD, BCPS Clinical Pharmacist Clinical phone 03/19/2017 until 3:30PM - #[phone] After hours, please call [phone] 03/19/2017, 8:29 AM

## 2017-03-20 LAB — GLUCOSE, CAPILLARY
Glucose-Capillary: 117 mg/dL — ABNORMAL HIGH (ref 65–99)
Glucose-Capillary: 134 mg/dL — ABNORMAL HIGH (ref 65–99)
Glucose-Capillary: 147 mg/dL — ABNORMAL HIGH (ref 65–99)
Glucose-Capillary: 175 mg/dL — ABNORMAL HIGH (ref 65–99)
Glucose-Capillary: 90 mg/dL (ref 65–99)

## 2017-03-20 MED ORDER — FAT EMULSION 20 % IV EMUL
240.0000 mL | INTRAVENOUS | Status: AC
  Administered 2017-03-20: 240 mL via INTRAVENOUS
  Filled 2017-03-20: qty 250

## 2017-03-20 MED ORDER — TRACE MINERALS CR-CU-MN-SE-ZN 10-1000-500-60 MCG/ML IV SOLN
INTRAVENOUS | Status: AC
  Administered 2017-03-20: 18:00:00 via INTRAVENOUS
  Filled 2017-03-20: qty 960

## 2017-03-20 NOTE — Progress Notes (Signed)
10 Days Post-Op   Subjective/Chief Complaint: Bloated, having bowel function, tube feeds held some yesterday due to bloating and some "spitting up"   Objective: Vital signs in last 24 hours: Temp:  [99.2 F (37.3 C)-99.3 F (37.4 C)] 99.2 F (37.3 C) (08/04 0532) Pulse Rate:  [80-84] 84 (08/04 0532) Resp:  [17-18] 18 (08/04 0532) BP: (149-161)/(55-63) 161/55 (08/04 0532) SpO2:  [99 %-100 %] 99 % (08/04 0532) Last BM Date: 03/16/17  Intake/Output from previous day: 08/03 0701 - 08/04 0700 In: 1146 [I.V.:918; NG/GT:188] Out: 350 [Urine:350] Intake/Output this shift: Total I/O In: -  Out: 400 [Urine:400]  GI: distended nontender gj in place wound clean  Lab Results:  No results for input(s): WBC, HGB, HCT, PLT in the last 72 hours. BMET  Recent Labs  03/18/17 0337 03/19/17 0811  NA 135 136  K 3.7 3.9  CL 107 106  CO2 25 25  GLUCOSE 129* 144*  BUN 11 10  CREATININE 0.81 0.80  CALCIUM 7.7* 7.8*   PT/INR No results for input(s): LABPROT, INR in the last 72 hours. ABG No results for input(s): PHART, HCO3 in the last 72 hours.  Invalid input(s): PCO2, PO2  Studies/Results: No results found.   Anti-infectives: Anti-infectives    Start     Dose/Rate Route Frequency Ordered Stop   03/05/17 0645  fluconazole (DIFLUCAN) tablet 200 mg     200 mg Oral  Once 03/05/17 0636 03/05/17 0657   02/16/17 1500  ceFAZolin (ANCEF) IVPB 2g/100 mL premix     2 g 200 mL/hr over 30 Minutes Intravenous To Radiology 02/16/17 1447 02/16/17 1613   02/09/17 0600  erythromycin (EES) 400 MG/5ML suspension 200 mg     200 mg Oral Every 6 hours 02/09/17 0147     02/07/17 0000  erythromycin (EES) 400 MG/5ML suspension 400 mg  Status:  Discontinued     400 mg Oral Every 6 hours 02/07/17 1932 02/09/17 0147   02/06/17 0600  erythromycin ethylsuccinate (EES) 200 MG/5ML suspension 400 mg  Status:  Discontinued     400 mg Oral Every 6 hours 02/06/17 0324 02/07/17 1932       Assessment/Plan: Gastroparesis post-Whipple - SIPS and chips - continue TPN for prolonged ileus/protein calorie malnutrition until tube feeds at goal.- Con't Gtube port to gravity, will keep tube feeds at 20 today and if any issues will need to be held again. If does ok consider increasing tomorrow      L rectus hematoma             LLE DVT       - lovenox held       - IVC filter placed 7/18  PT/OT Placement next week once tolerating tube feeds.    [facility] 03/20/2017

## 2017-03-20 NOTE — Progress Notes (Signed)
PHARMACY - ADULT TOTAL PARENTERAL NUTRITION CONSULT NOTE   Pharmacy Consult:  TPN Indication: Ileus  Patient Measurements: Height: 5\' 4"  (162.6 cm) Weight: 235 lb 7.2 oz (106.8 kg) IBW/kg (Calculated) : 54.7 TPN AdjBW (KG): 66.1 Body mass index is 40.42 kg/m.  Assessment:  62 YOF with severe malnutrition related to renal cell and pancreatic cancer who presented with abdominal mass on 01/12/17.  She is s/p Whipple procedure on the same day for pancreatic cancer.  She was discharged 02/05/17 and readmitted same day for vomiting at SNF. Pharmacy consulted to manage TPN for ileus that prevented oral intake and caused significant nausea and vomiting.   GI: s/p GJ 7/10, malpositioned, J tube placed 7/25.  Difficulty with advancing TF d/t abd tightness/N/V. Albumin low but up to 2.2, prealbumin low but up to 14.3. No drain O/P. BM x3. Emesis yesterday afternoon and TF were held for 4 hrs then resumed at 20 ml/hr.  Erythromycin for gastroparesis- patient refusing, PPI IV, PRN Zofran/Phenergan (QTc 47ms on 6/17). Endo: Hypothyroid on Synthroid.  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  Tmax 100.7, WBC WNL. AC/Heme: LLE DVT but has large rectus hematoma.  Lovenox stopped 7/17.  IVC filter placed 7/18. Hgb low but stable at 8.2 (last transfusion 7/19), plts WNL Best Practices: SCDs  TPN Access: double lumen PICC 02/02/17 TPN start date: 01/23/17   Nutritional Goals (per RD recommendation on 8/2): 1800-1950  kCal and 82-93 g protein  Current Nutrition: Osmolite 1.5 -keeping at 20 ml/hr this AM ( 30g of protein and 720 kcal)  *Goal 50 ml/hr (advancing slowly due to difficulty tolerating) Prostat 87mL daily (15 g of protein and 100 calories) TPN   Plan:   Continue Clinimix E 5/15 at 40 ml/hr + 20% ILE at 20 ml/hr x 12 hrs. Osmolite 1.5 at 20 ml/hr + Prostat 40mL daily per Surgery TPN + TF will provide 1982 kCal and 33 gm of protein per day, meeting 100% of patient's needs Daily multivitamin and trace elements in TPN Continue moderate SSI Q6H + 40 units regular insulin in TPN NS at 45 ml/hr per MD F/U AM labs, watch CBGs and may need TF insulin coverage F/U ability to advance TFs to goal and wean off of TPN   Sloan Leiter, PharmD, BCPS Clinical Pharmacist Clinical phone 03/20/2017 until 3:30PM - #[phone] After hours, please call [phone] 03/20/2017, 8:11 AM

## 2017-03-21 LAB — GLUCOSE, CAPILLARY
Glucose-Capillary: 157 mg/dL — ABNORMAL HIGH (ref 65–99)
Glucose-Capillary: 153 mg/dL — ABNORMAL HIGH (ref 65–99)
Glucose-Capillary: 134 mg/dL — ABNORMAL HIGH (ref 65–99)
Glucose-Capillary: 182 mg/dL — ABNORMAL HIGH (ref 65–99)

## 2017-03-21 MED ORDER — FAT EMULSION 20 % IV EMUL
240.0000 mL | INTRAVENOUS | Status: AC
  Administered 2017-03-21: 240 mL via INTRAVENOUS
  Filled 2017-03-21: qty 250

## 2017-03-21 MED ORDER — TRACE MINERALS CR-CU-MN-SE-ZN 10-1000-500-60 MCG/ML IV SOLN
INTRAVENOUS | Status: AC
  Administered 2017-03-21: 18:00:00 via INTRAVENOUS
  Filled 2017-03-21: qty 960

## 2017-03-21 NOTE — Progress Notes (Signed)
PHARMACY - ADULT TOTAL PARENTERAL NUTRITION CONSULT NOTE   Pharmacy Consult:  TPN Indication: Ileus  Patient Measurements: Height: 5\' 4"  (162.6 cm) Weight: 222 lb 1.1 oz (100.7 kg) IBW/kg (Calculated) : 54.7 TPN AdjBW (KG): 66.1 Body mass index is 38.12 kg/m.  Assessment:  36 YOF with severe malnutrition related to renal cell and pancreatic cancer who presented with abdominal mass on 01/12/17.  She is s/p Whipple procedure on the same day for pancreatic cancer.  She was discharged 02/05/17 and readmitted same day for vomiting at SNF. Pharmacy consulted to manage TPN for ileus that prevented oral intake and caused significant nausea and vomiting.   GI: s/p GJ 7/10, malpositioned, J tube placed 7/25.  Difficulty with advancing TF d/t abd tightness/N/V- held a lot on 8/4 due to emesis/nausea. Albumin low but up to 2.2, prealbumin low but up to 14.3. No drain O/P. LBM 8/4 (large). Erythromycin for gastroparesis- patient refusing, PPI IV, PRN Zofran/Phenergan (QTc 433ms on 6/17). Endo: Hypothyroid on Synthroid.  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  Tmax 100.7, WBC WNL. AC/Heme: LLE DVT but has large rectus hematoma.  Lovenox stopped 7/17.  IVC filter placed 7/18. Hgb low but stable at 8.2 (last transfusion 7/19), plts WNL Best Practices: SCDs  TPN Access: double lumen PICC 02/02/17 TPN start date: 01/23/17   Nutritional Goals (per RD recommendation on 8/2): 1800-1950 kCal and 82-93 g  protein  Current Nutrition: Osmolite 1.5  at 25 ml/hr this AM ( 37.5g of protein and 900 kcal if continuous x24 hrs, but patient has been off an on).  *Goal 50 ml/hr (advancing slowly due to difficulty tolerating) Prostat 30mL daily (15 g of protein and 100 calories) TPN   Plan:   Continue Clinimix E 5/15 at 40 ml/hr + 20% lipid emulsion at 20 ml/hr. Osmolite 1.5 at 20 ml/hr + Prostat 48mL daily per Surgery TPN + TF (if tolerates continuous for 24 hrs which patient has not thus far) will provide 2160 kCal and 100 gm of protein per day, meeting >100% of patient's needs Daily multivitamin and trace elements in TPN Continue moderate SSI Q6H + 40 units regular insulin in TPN NS at 45 ml/hr per MD F/U AM labs, watch CBGs and may need TF insulin coverage F/U ability to advance TFs to goal and wean off of TPN   Sloan Leiter, PharmD, BCPS Clinical Pharmacist Clinical phone 03/21/2017 until 3:30PM - #[phone] After hours, please call [phone] 03/21/2017, 7:02 AM

## 2017-03-21 NOTE — Progress Notes (Signed)
Patient ID: Kaylee Mullins, female   DOB: Aug 06, 1936, 81 y.o.   MRN: [id number] 11 Days Post-Op   Subjective: Patient fell getting out of bed yesterday. Fortunately did not really hurt herself but is embarrassed about this. Has a little soreness around her left ankle and fell on her bottom without any injuries. Tube feeding was held for a lot of yesterday due to bloating and nausea. Now restarted at 25 mL/h. She feels a little "tight" but no major discomfort or nausea. Had a large bowel movement yesterday.  Objective: Vital signs in last 24 hours: Temp:  [98.2 F (36.8 C)-99.4 F (37.4 C)] 99 F (37.2 C) (08/05 0616) Pulse Rate:  [70-76] 74 (08/05 0616) Resp:  [16-18] 16 (08/05 0616) BP: (132-160)/(59-62) 148/62 (08/05 0616) SpO2:  [96 %-100 %] 96 % (08/05 0616) Weight:  [100.7 kg (222 lb 1.1 oz)-100.7 kg (222 lb 1.6 oz)] 100.7 kg (222 lb 1.1 oz) (08/05 0616) Last BM Date: 03/20/17  Intake/Output from previous day: 08/04 0701 - 08/05 0700 In: 3523.9 [I.V.:2537.3; NG/GT:646.6; IV Piggyback:120] Out: 1600 [Urine:1550; Drains:50] Intake/Output this shift: No intake/output data recorded.  General appearance: alert, cooperative and no distress GI: Obese with possibly some distention but not tense. No significant tenderness. G-tube to drainage. Incision/Wound: No erythema or drainage Extremities: 1+ edema. Left ankle without tenderness or unusual swelling and has good range of motion  Lab Results:  No results for input(s): WBC, HGB, HCT, PLT in the last 72 hours. BMET  Recent Labs  03/19/17 0811  NA 136  K 3.9  CL 106  CO2 25  GLUCOSE 144*  BUN 10  CREATININE 0.80  CALCIUM 7.8*     Studies/Results: No results found.  Anti-infectives: Anti-infectives    Start     Dose/Rate Route Frequency Ordered Stop   03/05/17 0645  fluconazole (DIFLUCAN) tablet 200 mg     200 mg Oral  Once 03/05/17 0636 03/05/17 0657   02/16/17 1500  ceFAZolin (ANCEF) IVPB 2g/100 mL premix     2  g 200 mL/hr over 30 Minutes Intravenous To Radiology 02/16/17 1447 02/16/17 1613   02/09/17 0600  erythromycin (EES) 400 MG/5ML suspension 200 mg     200 mg Oral Every 6 hours 02/09/17 0147     02/07/17 0000  erythromycin (EES) 400 MG/5ML suspension 400 mg  Status:  Discontinued     400 mg Oral Every 6 hours 02/07/17 1932 02/09/17 0147   02/06/17 0600  erythromycin ethylsuccinate (EES) 200 MG/5ML suspension 400 mg  Status:  Discontinued     400 mg Oral Every 6 hours 02/06/17 0324 02/07/17 1932      Assessment/Plan: s/p Procedure(s): EXPLORATORY LAPAROTOMY Open jejunostomy tube Persisting gastroparesis post-Whipple. Currently tolerating tube feedings at 25 mL/h. Feels bloated and tight subjectively and I will not write to increase these currently.    LOS: 43 days    Alysiana Ethridge T 03/21/2017

## 2017-03-22 LAB — DIFFERENTIAL
Basophils Absolute: 0 10*3/uL (ref 0.0–0.1)
Basophils Relative: 0 %
Eosinophils Absolute: 0.2 10*3/uL (ref 0.0–0.7)
Eosinophils Relative: 3 %
Lymphocytes Relative: 20 %
Lymphs Abs: 1.5 10*3/uL (ref 0.7–4.0)
Monocytes Absolute: 1 10*3/uL (ref 0.1–1.0)
Monocytes Relative: 13 %
Neutro Abs: 4.8 10*3/uL (ref 1.7–7.7)
Neutrophils Relative %: 64 %

## 2017-03-22 LAB — COMPREHENSIVE METABOLIC PANEL
ALT: 18 U/L (ref 14–54)
AST: 21 U/L (ref 15–41)
Albumin: 2.6 g/dL — ABNORMAL LOW (ref 3.5–5.0)
Alkaline Phosphatase: 92 U/L (ref 38–126)
Anion gap: 6 (ref 5–15)
BUN: 8 mg/dL (ref 6–20)
CO2: 26 mmol/L (ref 22–32)
Calcium: 8 mg/dL — ABNORMAL LOW (ref 8.9–10.3)
Chloride: 105 mmol/L (ref 101–111)
Creatinine, Ser: 0.71 mg/dL (ref 0.44–1.00)
GFR calc Af Amer: >60 mL/min (ref >60)
GFR calc non Af Amer: >60 mL/min (ref >60)
Glucose, Bld: 217 mg/dL — ABNORMAL HIGH (ref 65–99)
Potassium: 3.9 mmol/L (ref 3.5–5.1)
Sodium: 137 mmol/L (ref 135–145)
Total Bilirubin: 0.7 mg/dL (ref 0.3–1.2)
Total Protein: 7.1 g/dL (ref 6.5–8.1)

## 2017-03-22 LAB — GLUCOSE, CAPILLARY
Glucose-Capillary: 210 mg/dL — ABNORMAL HIGH (ref 65–99)
Glucose-Capillary: 200 mg/dL — ABNORMAL HIGH (ref 65–99)
Glucose-Capillary: 203 mg/dL — ABNORMAL HIGH (ref 65–99)

## 2017-03-22 LAB — CBC
HCT: 29.4 % — ABNORMAL LOW (ref 36.0–46.0)
Hemoglobin: 9.4 g/dL — ABNORMAL LOW (ref 12.0–15.0)
MCH: 30.1 pg (ref 26.0–34.0)
MCHC: 32 g/dL (ref 30.0–36.0)
MCV: 94.2 fL (ref 78.0–100.0)
Platelets: 427 10*3/uL — ABNORMAL HIGH (ref 150–400)
RBC: 3.12 MIL/uL — ABNORMAL LOW (ref 3.87–5.11)
RDW: 17.3 % — ABNORMAL HIGH (ref 11.5–15.5)
WBC: 7.5 10*3/uL (ref 4.0–10.5)

## 2017-03-22 LAB — PREALBUMIN: Prealbumin: 11.5 mg/dL — ABNORMAL LOW (ref 18–38)

## 2017-03-22 LAB — TRIGLYCERIDES: Triglycerides: 93 mg/dL (ref <150)

## 2017-03-22 LAB — PHOSPHORUS: Phosphorus: 2.8 mg/dL (ref 2.5–4.6)

## 2017-03-22 LAB — MAGNESIUM: Magnesium: 1.8 mg/dL (ref 1.7–2.4)

## 2017-03-22 MED ORDER — MORPHINE SULFATE (PF) 4 MG/ML IV SOLN
1.0000 mg | INTRAVENOUS | Status: DC | PRN
  Filled 2017-03-22: qty 1

## 2017-03-22 MED ORDER — FAT EMULSION 20 % IV EMUL
240.0000 mL | INTRAVENOUS | Status: AC
  Administered 2017-03-22: 240 mL via INTRAVENOUS
  Filled 2017-03-22: qty 250

## 2017-03-22 MED ORDER — ALTEPLASE 2 MG IJ SOLR
2.0000 mg | Freq: Once | INTRAMUSCULAR | Status: AC
  Administered 2017-03-22: 2 mg

## 2017-03-22 MED ORDER — M.V.I. ADULT IV INJ
INJECTION | INTRAVENOUS | Status: AC
  Administered 2017-03-22: 18:00:00 via INTRAVENOUS
  Filled 2017-03-22: qty 960

## 2017-03-22 MED ORDER — MAGNESIUM SULFATE 2 GM/50ML IV SOLN
2.0000 g | Freq: Once | INTRAVENOUS | Status: AC
  Administered 2017-03-22: 2 g via INTRAVENOUS
  Filled 2017-03-22: qty 50

## 2017-03-22 NOTE — Progress Notes (Signed)
Patient ID: Kaylee Mullins, female   DOB: 25-May-1936, 81 y.o.   MRN: [id number] 12 Days Post-Op   Subjective: Pt threw up early AM today.  Tube feeds held.  Not throwing up tube feeds, however.     Objective: Vital signs in last 24 hours: Temp:  [98.3 F (36.8 C)-98.8 F (37.1 C)] 98.3 F (36.8 C) (08/06 1424) Pulse Rate:  [78-79] 78 (08/06 1424) Resp:  [16-18] 16 (08/06 1424) BP: (156-171)/(60-65) 156/62 (08/06 1424) SpO2:  [98 %] 98 % (08/06 1424) Weight:  [103 kg (227 lb 1.6 oz)] 103 kg (227 lb 1.6 oz) (08/06 0517) Last BM Date: 03/22/17  Intake/Output from previous day: 08/05 0701 - 08/06 0700 In: 2407.3 [P.O.:20; I.V.:2144.3; NG/GT:212.9] Out: 2200 [Urine:1850; Emesis/NG output:300; Drains:50] Intake/Output this shift: Total I/O In: 560 [I.V.:440; Other:20; IV Piggyback:100] Out: 750 [Urine:700; Drains:50]  General appearance: alert, cooperative and no distress GI: distention but not dramatic. No significant tenderness. G-tube to drainage. Incision/Wound: No erythema or drainage Extremities: 1+ edema.   Lab Results:   Recent Labs  03/22/17 0543  WBC 7.5  HGB 9.4*  HCT 29.4*  PLT 427*   BMET  Recent Labs  03/22/17 0543  NA 137  K 3.9  CL 105  CO2 26  GLUCOSE 217*  BUN 8  CREATININE 0.71  CALCIUM 8.0*     Studies/Results: No results found.  Anti-infectives: Anti-infectives    Start     Dose/Rate Route Frequency Ordered Stop   03/05/17 0645  fluconazole (DIFLUCAN) tablet 200 mg     200 mg Oral  Once 03/05/17 0636 03/05/17 0657   02/16/17 1500  ceFAZolin (ANCEF) IVPB 2g/100 mL premix     2 g 200 mL/hr over 30 Minutes Intravenous To Radiology 02/16/17 1447 02/16/17 1613   02/09/17 0600  erythromycin (EES) 400 MG/5ML suspension 200 mg  Status:  Discontinued     200 mg Oral Every 6 hours 02/09/17 0147 03/22/17 0920   02/07/17 0000  erythromycin (EES) 400 MG/5ML suspension 400 mg  Status:  Discontinued     400 mg Oral Every 6 hours 02/07/17 1932  02/09/17 0147   02/06/17 0600  erythromycin ethylsuccinate (EES) 200 MG/5ML suspension 400 mg  Status:  Discontinued     400 mg Oral Every 6 hours 02/06/17 0324 02/07/17 1932      Assessment/Plan: s/p Procedure(s): EXPLORATORY LAPAROTOMY Open jejunostomy tube Persisting gastroparesis post-Whipple. Will restart tube feeds in AM.      LOS: 44 days    Kaylee Mullins 03/22/2017

## 2017-03-22 NOTE — Progress Notes (Signed)
PHARMACY - ADULT TOTAL PARENTERAL NUTRITION CONSULT NOTE   Pharmacy Consult:  TPN Indication: Ileus  Patient Measurements: Height: 5\' 4"  (162.6 cm) Weight: 227 lb 1.6 oz (103 kg) IBW/kg (Calculated) : 54.7 TPN AdjBW (KG): 66.1 Body mass index is 38.98 kg/m.  Assessment:  39 YOF with severe malnutrition related to renal cell and pancreatic cancer who presented with abdominal mass on 01/12/17.  She is s/p Whipple procedure on the same day for pancreatic cancer.  She was discharged 02/05/17 and readmitted same day for vomiting at SNF. Pharmacy consulted to manage TPN for ileus that prevented oral intake and caused significant nausea and vomiting.   GI: s/p GJ 7/10, malpositioned, J tube placed 7/25.  Difficulty with advancing TF d/t abd tightness/N/V- held early 8/6 AM 2nd bloating.  Albumin low but up to 2.2, PAlb decreased to 11.5. No drain O/P. LBM 8/4 (large). Erythromycin for gastroparesis- patient refusing, PPI IV, PRN Zofran/Phenergan (QTc 436ms on 6/17). Endo: Hypothyroid on Synthroid.  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  Tmax 100.7, WBC WNL. AC/Heme: LLE DVT but has large rectus hematoma.  Lovenox stopped 7/17.  IVC filter placed 7/18. Hgb low but stable at 8.2 (last transfusion 7/19), plts WNL Best Practices: SCDs  TPN Access: double lumen PICC 02/02/17 TPN start date: 01/23/17   Nutritional Goals (per RD recommendation on 8/2): 1800-1950 kCal and 82-93 g protein  Current  Nutrition: Osmolite 1.5  at 25 ml/hr this AM ( 37.5g of protein and 900 kcal if continuous x24 hrs, but has been off and on).  *Goal 50 ml/hr (advancing slowly due to difficulty tolerating) Prostat 93mL daily (15 g of protein and 100 calories) TPN   Plan:   Continue Clinimix E 5/15 at 40 ml/hr + 20% lipid emulsion at 20 ml/hr. Osmolite 1.5 at 26ml/hr (currently on hold) + Prostat 76mL daily per Surgery TPN + TF (if tolerates continuous for 24 hrs which patient has not thus far) will provide 2160 kCal and 100 gm of protein per day, meeting >100% of patient's needs Daily multivitamin and trace elements in TPN Increase Insulin to 45 units in TPN Continue moderate SSI q6h NS at 40 ml/hr per MD F/U AM labs, watch CBGs and may need TF insulin coverage F/U ability to advance TFs to goal and wean off of TPN    Lewie Chamber., PharmD Clinical Pharmacist [facility] [phone] before 3pm, then call [phone]

## 2017-03-22 NOTE — Progress Notes (Signed)
Pt c/o that she feel so bloated and can't tolerate the tube feeding anymore. Dr Brantley Stage notified and ordered to turn off TF for now.

## 2017-03-22 NOTE — Clinical Social Work Note (Signed)
Discharge plan for pt is [facility]. Pt not medically stable at this time. CSW continuing to follow for discharge needs.  [name], [place], [place] Work [phone]

## 2017-03-23 LAB — GLUCOSE, CAPILLARY
Glucose-Capillary: 154 mg/dL — ABNORMAL HIGH (ref 65–99)
Glucose-Capillary: 163 mg/dL — ABNORMAL HIGH (ref 65–99)
Glucose-Capillary: 171 mg/dL — ABNORMAL HIGH (ref 65–99)
Glucose-Capillary: 174 mg/dL — ABNORMAL HIGH (ref 65–99)

## 2017-03-23 MED ORDER — FUROSEMIDE 10 MG/ML IJ SOLN
20.0000 mg | Freq: Once | INTRAMUSCULAR | Status: AC
  Administered 2017-03-23: 20 mg via INTRAVENOUS
  Filled 2017-03-23: qty 2

## 2017-03-23 MED ORDER — TRACE MINERALS CR-CU-MN-SE-ZN 10-1000-500-60 MCG/ML IV SOLN
INTRAVENOUS | Status: AC
  Administered 2017-03-23: 18:00:00 via INTRAVENOUS
  Filled 2017-03-23: qty 1440

## 2017-03-23 MED ORDER — SODIUM CHLORIDE 0.9 % IV SOLN
INTRAVENOUS | Status: AC
  Administered 2017-03-24: 06:00:00 via INTRAVENOUS

## 2017-03-23 MED ORDER — FAT EMULSION 20 % IV EMUL
240.0000 mL | INTRAVENOUS | Status: AC
  Administered 2017-03-23: 240 mL via INTRAVENOUS
  Filled 2017-03-23: qty 250

## 2017-03-23 NOTE — Progress Notes (Signed)
Occupational Therapy Treatment Patient Details Name: Kaylee Mullins MRN: [id number] DOB: 05/11/1936 Today's Date: 03/23/2017    History of present illness Pt is a 81 y/o female who was dishcharged and readmitted following new onset nausea at SNF. Pt is s/p whipple placement from previous admission, and had GJ tube placed this admission. Pt with LLE DVT and L abdomen intramuscular hematoma; lovenox discontinued and supposed to have IV filter placed today or tomorrow. PMH includes anxiety, thyroid cancer, pancreatic mass s/p whipple, DM, HTN, CVA with L sided weakness, and s/p lumbar surgery.    OT comments  Pt progressing towards established OT goals. Pt continues to require increased encouragement to perform OOB activities. Pt performed toileting with Min Guard A. Performed grooming while seated with set up A. Will continue to follow acutely to facilitate safe dc and increase pt activity tolerance. Continue to recommend dc SNF for further OT.    Follow Up Recommendations  SNF;Supervision/Assistance - 24 hour    Equipment Recommendations  None recommended by OT    Recommendations for Other Services PT consult    Precautions / Restrictions Precautions Precautions: Fall Precaution Comments: GJ tube  Required Braces or Orthoses: Other Brace/Splint Other Brace/Splint: abdominal binder (need to use two binders together) Restrictions Weight Bearing Restrictions: No       Mobility Bed Mobility Overal bed mobility: Needs Assistance Bed Mobility: Sit to Supine       Sit to supine: Supervision   General bed mobility comments: supervision for pt safety. Pt requiring cueing for technique.  Transfers Overall transfer level: Needs assistance Equipment used: None Transfers: Sit to/from Stand Sit to Stand: Min guard Stand pivot transfers: Min guard       General transfer comment: stand pivot to 3in1. Min guard for safety, VC's for hand placement     Balance Overall balance  assessment: Needs assistance Sitting-balance support: Feet supported;No upper extremity supported Sitting balance-Leahy Scale: Good Sitting balance - Comments: Able to lean forward to adjust socks   Standing balance support: Bilateral upper extremity supported;During functional activity Standing balance-Leahy Scale: Good Standing balance comment: No UE required with static standing, UE support required during gait                           ADL either performed or assessed with clinical judgement   ADL Overall ADL's : Needs assistance/impaired Eating/Feeding: Set up;Sitting   Grooming: Set up;Sitting;Brushing hair;Wash/dry face;Wash/dry hands           Upper Body Dressing : Sitting;Minimal assistance Upper Body Dressing Details (indicate cue type and reason): don new gown on backside Lower Body Dressing: Min guard;Sit to/from stand Lower Body Dressing Details (indicate cue type and reason): Adjusts socks Toilet Transfer: Min Statistician Details (indicate cue type and reason): Min guard for safety Toileting- Clothing Manipulation and Hygiene: Min guard;Sit to/from stand Toileting - Clothing Manipulation Details (indicate cue type and reason): Pt performed peri care with Min guard A for safety.      Functional mobility during ADLs: Min guard;Rolling walker General ADL Comments: Upon arrival, pt reporting that she needs to use the restroom. Pt perform Min Guard A to BSC. Pt completes grooming tasks while seated on BSC. Encouraged pt that OOB activities are benefitial. Pt agreed to sit in recliner.     Vision       Perception     Praxis      Cognition Arousal/Alertness: Awake/alert Behavior During Therapy: Flat  affect Overall Cognitive Status: Within Functional Limits for tasks assessed                                          Exercises     Shoulder Instructions       General Comments Pt reporting that she has  been nauseous     Pertinent Vitals/ Pain       Pain Assessment: Faces Faces Pain Scale: Hurts a little bit Pain Location: abdomen Pain Descriptors / Indicators: Discomfort Pain Intervention(s): Monitored during session;Limited activity within patient's tolerance;Repositioned  Home Living                                          Prior Functioning/Environment              Frequency  Min 2X/week        Progress Toward Goals  OT Goals(current goals can now be found in the care plan section)  Progress towards OT goals: Progressing toward goals  Acute Rehab OT Goals Patient Stated Goal: to get better  OT Goal Formulation: With patient Time For Goal Achievement: 03/19/17 Potential to Achieve Goals: Good ADL Goals Pt Will Perform Grooming: with min guard assist;standing Pt Will Perform Upper Body Bathing: with set-up;with supervision;sitting Pt Will Perform Lower Body Bathing: with min guard assist;sit to/from stand;with adaptive equipment Pt Will Perform Upper Body Dressing: with supervision;with set-up;sitting Pt Will Perform Lower Body Dressing: with min guard assist;sit to/from stand Pt Will Transfer to Toilet: with min guard assist;bedside commode;ambulating Pt Will Perform Toileting - Clothing Manipulation and hygiene: with min guard assist;sit to/from stand  Plan Discharge plan remains appropriate    Co-evaluation    PT/OT/SLP Co-Evaluation/Treatment: Yes Reason for Co-Treatment: Complexity of the patient's impairments (multi-system involvement) PT goals addressed during session: Mobility/safety with mobility OT goals addressed during session: ADL's and self-care      AM-PAC PT "6 Clicks" Daily Activity     Outcome Measure   Help from another person eating meals?: None Help from another person taking care of personal grooming?: A Little Help from another person toileting, which includes using toliet, bedpan, or urinal?: A Little Help from  another person bathing (including washing, rinsing, drying)?: A Little Help from another person to put on and taking off regular upper body clothing?: A Little Help from another person to put on and taking off regular lower body clothing?: A Little 6 Click Score: 19    End of Session Equipment Utilized During Treatment: Gait belt;Rolling walker  OT Visit Diagnosis: Unsteadiness on feet (R26.81);Pain Pain - Right/Left:  (abdomen) Pain - part of body:  (abdomen)   Activity Tolerance Patient tolerated treatment well   Patient Left with call bell/phone within reach;in chair   Nurse Communication Patient requests pain meds;Mobility status (Abdominal binder too small)        Time: 6294-7654 OT Time Calculation (min): 28 min  Charges: OT General Charges $OT Visit: 1 Procedure OT Treatments $Self Care/Home Management : 8-22 mins  [place], OTR/L Acute Rehab Pager: [phone] Office: [place] 03/23/2017, 5:08 PM

## 2017-03-23 NOTE — Progress Notes (Signed)
PHARMACY - ADULT TOTAL PARENTERAL NUTRITION CONSULT NOTE   Pharmacy Consult:  TPN Indication: Ileus  Patient Measurements: Height: 5\' 4"  (162.6 cm) Weight: 222 lb 3.2 oz (100.8 kg) IBW/kg (Calculated) : 54.7 TPN AdjBW (KG): 66.1 Body mass index is 38.14 kg/m.  Assessment:  26 YOF with severe malnutrition related to renal cell and pancreatic cancer who presented with abdominal mass on 01/12/17.  She is s/p Whipple procedure on the same day for pancreatic cancer.  She was discharged 02/05/17 and readmitted same day for vomiting at SNF. Pharmacy consulted to manage TPN for ileus that prevented oral intake and caused significant nausea and vomiting.   GI: s/p GJ 7/10, malpositioned, J tube placed 7/25.  Difficulty with advancing TF d/t abd tightness/N/V- held early 8/6 AM 2nd vomiting and bloating - MD planning to resume TFs 8/7.  Albumin low but up to 2.6, PAlb decreased to 11.5. Drain output~100cc. LBM 8/6. On PPI IV, PRN Zofran/Phenergan (QTc 444ms on 6/17). Endo: Hypothyroid on Synthroid.  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 AC/Heme: LLE DVT but has large rectus hematoma.  Lovenox stopped 7/17.  IVC filter placed 7/18. Hgb low but stable at 9.4, plts WNL Best Practices: SCDs  TPN Access: double lumen PICC 02/02/17 TPN start date: 01/23/17   Nutritional Goals (per RD recommendation on 8/2): 1800-1950 kCal and 82-93 g protein  Current Nutrition: TFs remain OFF this  morning Prostat 72mL daily (15 g of protein and 100 calories) TPN  Plan:   Increase Clinimix E 5/15 at 60 ml/hr + 20% lipid emulsion at 20 ml/hr. TFs remain HELD this morning, continues on Prostat 24mL daily per Surgery TPN + prostat will provide ~1602 kcal and 82g protein meeting 90% kcal and 100% protein goals Daily multivitamin and trace elements in TPN Increase Insulin to 50 units in TPN Continue moderate SSI q6h Will reduce IVF to 25 ml/hr when TPN rate increased this evening  F/U AM labs, watch CBGs and may need TF insulin coverage F/U ability to advance TFs to goal and wean off of TPN  Thank you for allowing pharmacy to be a part of this patient's care.  Alycia Rossetti, PharmD, BCPS Clinical Pharmacist Pager: [phone] Clinical phone for 03/23/2017 from 7a-3:30p: [phone] If after 3:30p, please call main pharmacy at: [phone] 03/23/2017 10:06 AM

## 2017-03-23 NOTE — Progress Notes (Signed)
Physical Therapy Treatment Patient Details Name: Kaylee Mullins MRN: [id number] DOB: [birth year] Today's Date: 03/23/2017    History of Present Illness Pt is a 81 y/o female who was dishcharged and readmitted following new onset nausea at SNF. Pt is s/p whipple placement from previous admission, and had GJ tube placed this admission. Pt with LLE DVT and L abdomen intramuscular hematoma; lovenox discontinued and supposed to have IV filter placed today or tomorrow. PMH includes anxiety, thyroid cancer, pancreatic mass s/p whipple, DM, HTN, CVA with L sided weakness, and s/p lumbar surgery.     PT Comments    Today's skilled session focused on gait training with RW. She is slowly progressing towards goals; however currently limited by abdominal pain and fatigue. Patient would benefit from continued skilled PT to increase activity tolerance and functional independence. Will continue to follow acutely.    Follow Up Recommendations  SNF     Equipment Recommendations  3in1 (PT)    Recommendations for Other Services       Precautions / Restrictions Precautions Precautions: Fall Precaution Comments: GJ tube  Required Braces or Orthoses: Other Brace/Splint Other Brace/Splint: abdominal binder (need to use two binders together) Restrictions Weight Bearing Restrictions: No    Mobility  Bed Mobility Overal bed mobility: Needs Assistance Bed Mobility: Sit to Supine       Sit to supine: Supervision   General bed mobility comments: supervision for pt safety. Pt requiring cueing for technique.  Transfers Overall transfer level: Needs assistance Equipment used: None Transfers: Sit to/from Stand Sit to Stand: Min guard Stand pivot transfers: Min guard       General transfer comment: stand pivot to 3in1. Min guard for safety, VC's for hand placement   Ambulation/Gait Ambulation/Gait assistance: Min guard Ambulation Distance (Feet): 200 Feet Assistive device: Rolling walker (2  wheeled) Gait Pattern/deviations: Step-through pattern;Decreased dorsiflexion - left;Decreased dorsiflexion - right Gait velocity: decreased Gait velocity interpretation: Below normal speed for age/gender General Gait Details: Slow steady gait. Pt with difficulty turning with RW.    Stairs            Wheelchair Mobility    Modified Rankin (Stroke Patients Only)       Balance Overall balance assessment: Needs assistance Sitting-balance support: Feet supported;No upper extremity supported Sitting balance-Leahy Scale: Good Sitting balance - Comments: Able to lean forward to adjust socks   Standing balance support: Bilateral upper extremity supported;During functional activity Standing balance-Leahy Scale: Good Standing balance comment: No UE required with static standing, UE support required during gait                            Cognition Arousal/Alertness: Awake/alert Behavior During Therapy: Flat affect Overall Cognitive Status: Within Functional Limits for tasks assessed                                        Exercises      General Comments        Pertinent Vitals/Pain Pain Assessment: Faces Faces Pain Scale: Hurts a little bit Pain Location: abdomen Pain Descriptors / Indicators: Discomfort Pain Intervention(s): Monitored during session    Home Living                      Prior Function            PT Goals (  current goals can now be found in the care plan section) Acute Rehab PT Goals Patient Stated Goal: to get better  PT Goal Formulation: With patient Time For Goal Achievement: 04/01/17 Potential to Achieve Goals: Good Progress towards PT goals: Progressing toward goals    Frequency    Min 2X/week      PT Plan Current plan remains appropriate    Co-evaluation PT/OT/SLP Co-Evaluation/Treatment: Yes Reason for Co-Treatment: Complexity of the patient's impairments (multi-system involvement);For  patient/therapist safety;To address functional/ADL transfers PT goals addressed during session: Mobility/safety with mobility OT goals addressed during session: ADL's and self-care      AM-PAC PT "6 Clicks" Daily Activity  Outcome Measure  Difficulty turning over in bed (including adjusting bedclothes, sheets and blankets)?: None Difficulty moving from lying on back to sitting on the side of the bed? : A Little Difficulty sitting down on and standing up from a chair with arms (e.g., wheelchair, bedside commode, etc,.)?: A Little Help needed moving to and from a bed to chair (including a wheelchair)?: A Little Help needed walking in hospital room?: A Little Help needed climbing 3-5 steps with a railing? : A Lot 6 Click Score: 18    End of Session Equipment Utilized During Treatment: Gait belt Activity Tolerance: Patient tolerated treatment well Patient left: in chair;with call bell/phone within reach Nurse Communication: Mobility status;Other (comment) (need for larget abdominal binder) PT Visit Diagnosis: Difficulty in walking, not elsewhere classified (R26.2);Muscle weakness (generalized) (M62.81);Pain Pain - part of body:  (abdomen )     Time: 4540-9811 PT Time Calculation (min) (ACUTE ONLY): 28 min  Charges:  $Gait Training: 8-22 mins                    G Codes:      Benjiman Core, [place] Pager [phone] Acute Rehab   Allena Katz 03/23/2017, 1:41 PM

## 2017-03-23 NOTE — Progress Notes (Signed)
Nutrition Follow-up  DOCUMENTATION CODES:   Non-severe (moderate) malnutrition in context of acute illness/injury, Obesity unspecified  INTERVENTION:   -Continue Osmolite 1.5 @ 20 ml/hr via j-port. Recommend slowly increase to goal rate of 50 ml/hr.  -Continue 30 ml Prostat daily -Complete TF regimen at goal rate to provide1900kcal (100% of needs), 90grams of protein, and 967m of H2O.  -TPN management per pharmacy  NUTRITION DIAGNOSIS:   Malnutrition related to chronic illness, acute illness as evidenced by mild depletion of muscle mass, mild depletion of body fat.  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 Had prolonged postop course due to delayed gastric emptying due to gastroparesis and intolerance of TNA, ultimately started on TPN. Represents from SNF after being discharged for less than 12 hours, due to vomiting and abdominal pain.   6/27- s/p endoscopy, which revealed normal esophagus, gastrojejunostmy with healthy appearing mucosa, and stomach with a lot of bilious fluid 6/27- NGT placed 6/30- per [facility] notes, unobstructed outflow from stomach, but no motility (outflow seems gravity dependent) 7/10- GJ tube placed, however, tip of tube in the stomach 7/17- CT revealed large L rectus hematoma 7/18- IVC filter placed 7/25 - J tube surgically placed 7/26- TF started   Case discussed with RN. GLula Olszewskiremains to gravity; per MD notes, may start clamping trials this wek. TF was held on 03/22/17, due to vomiting and bloating. Vomit did not appear to be TF.   TF was re-started today. Osmolite 1.5 currently infusing at 20 ml/hr via j-port (providing  720 kcals, 30 grams protein, and 366 ml free water daily). RN reports no complaints of nausea of vomiting since re-starting TF. Plan to advance to 25 ml/hr within 12 hours.   Per pharmacy note, pt receiving Clinimix E 5/15 at 40 ml/hr + 20% lipid emulsion at 20 ml/hr, proving 1162 kcals and 48 grams protein. Plan to increase to Clinimix E 5/15 at 60 ml/hr + 20% lipid emulsion at 20 ml/hr at 1600.   Current nutrition support regimen providing 1882 kcals and 78 grams protein, meeting 100% of estimated kcal needs and 95% of estimated protein needs.  Labs reviewed: CBGS: 154-210.   Diet Order:  Diet NPO time specified Except for: Sips with Meds TPN (CLINIMIX-E) Adult TPN (CLINIMIX-E) Adult  Skin:  Wound (see comment) (closed abdominal incision)  Last BM:  03/22/17  Height:   Ht Readings from Last 1 Encounters:  03/10/17 5' 4"  (1.626 m)    Weight:   Wt Readings from Last 1 Encounters:  03/23/17 222 lb 3.2 oz (100.8 kg)    Ideal Body Weight:  54.54 kg  BMI:  Body mass index is 38.14 kg/m.  Estimated Nutritional Needs:   Kcal:  1800-1950 (20-22 kcal/kg bw)  Protein:  82-93g Pro (1.5-1.7g/kg ibw)  Fluid:  1.8-2 L fluid (1 ml/kcal)  EDUCATION NEEDS:   No education needs identified at this time  Marlos Carmen A. WJimmye [name] RD, LDN, CDE Pager: [phone] hours Pager: [phone]

## 2017-03-23 NOTE — Progress Notes (Signed)
Patient ID: Kaylee Mullins, female   DOB: 1936-07-13, 81 y.o.   MRN: [id number] 13 Days Post-Op   Subjective: Feeling better.  Tube feeds held around 24 hours.     Objective: Vital signs in last 24 hours: Temp:  [98.1 F (36.7 C)-98.3 F (36.8 C)] 98.1 F (36.7 C) (08/07 0403) Pulse Rate:  [77-87] 87 (08/07 0403) Resp:  [16-18] 18 (08/07 0403) BP: (122-158)/(62-91) 122/91 (08/07 0403) SpO2:  [98 %] 98 % (08/07 0403) Weight:  [100.8 kg (222 lb 3.2 oz)] 100.8 kg (222 lb 3.2 oz) (08/07 0500) Last BM Date: 03/22/17  Intake/Output from previous day: 08/06 0701 - 08/07 0700 In: 1010 [I.V.:890; IV Piggyback:100] Out: 750 [Urine:700; Drains:50] Intake/Output this shift: Total I/O In: -  Out: 200 [Urine:200]  General appearance: alert, cooperative and no distress GI: less distended.   No significant tenderness. G-tube to drainage. No erythema or drainage Extremities: 2+ edema.   Lab Results:   Recent Labs  03/22/17 0543  WBC 7.5  HGB 9.4*  HCT 29.4*  PLT 427*   BMET  Recent Labs  03/22/17 0543  NA 137  K 3.9  CL 105  CO2 26  GLUCOSE 217*  BUN 8  CREATININE 0.71  CALCIUM 8.0*     Studies/Results: No results found.  Anti-infectives: Anti-infectives    Start     Dose/Rate Route Frequency Ordered Stop   03/05/17 0645  fluconazole (DIFLUCAN) tablet 200 mg     200 mg Oral  Once 03/05/17 0636 03/05/17 0657   02/16/17 1500  ceFAZolin (ANCEF) IVPB 2g/100 mL premix     2 g 200 mL/hr over 30 Minutes Intravenous To Radiology 02/16/17 1447 02/16/17 1613   02/09/17 0600  erythromycin (EES) 400 MG/5ML suspension 200 mg  Status:  Discontinued     200 mg Oral Every 6 hours 02/09/17 0147 03/22/17 0920   02/07/17 0000  erythromycin (EES) 400 MG/5ML suspension 400 mg  Status:  Discontinued     400 mg Oral Every 6 hours 02/07/17 1932 02/09/17 0147   02/06/17 0600  erythromycin ethylsuccinate (EES) 200 MG/5ML suspension 400 mg  Status:  Discontinued     400 mg Oral Every  6 hours 02/06/17 0324 02/07/17 1932      Assessment/Plan: s/p Procedure(s): EXPLORATORY LAPAROTOMY Open jejunostomy tube Persisting gastroparesis post-Whipple. Restart tube feeds at 20 ml/hr with increase to 25 ml/hr in 12 hours. D/c staples this week.   May try clamping g tube later this week.     LOS: 45 days    Orvan Papadakis 03/23/2017

## 2017-03-23 NOTE — Progress Notes (Signed)
Pt vomiting. Notified Dr. [name]. Zofran given, TFs stopped.

## 2017-03-24 ENCOUNTER — Inpatient Hospital Stay (HOSPITAL_COMMUNITY): Payer: Medicare Other

## 2017-03-24 LAB — GLUCOSE, CAPILLARY
Glucose-Capillary: 146 mg/dL — ABNORMAL HIGH (ref 65–99)
Glucose-Capillary: 147 mg/dL — ABNORMAL HIGH (ref 65–99)
Glucose-Capillary: 152 mg/dL — ABNORMAL HIGH (ref 65–99)
Glucose-Capillary: 158 mg/dL — ABNORMAL HIGH (ref 65–99)
Glucose-Capillary: 167 mg/dL — ABNORMAL HIGH (ref 65–99)

## 2017-03-24 MED ORDER — LEVOTHYROXINE SODIUM 100 MCG PO TABS
200.0000 ug | ORAL_TABLET | Freq: Every day | ORAL | Status: DC
  Administered 2017-03-24 – 2017-03-29 (×5): 200 ug via ORAL
  Filled 2017-03-24 (×5): qty 2

## 2017-03-24 MED ORDER — SODIUM CHLORIDE 0.9 % IV SOLN
INTRAVENOUS | Status: DC
  Administered 2017-03-26 – 2017-03-28 (×2): via INTRAVENOUS

## 2017-03-24 MED ORDER — TRACE MINERALS CR-CU-MN-SE-ZN 10-1000-500-60 MCG/ML IV SOLN
INTRAVENOUS | Status: AC
  Administered 2017-03-24: 18:00:00 via INTRAVENOUS
  Filled 2017-03-24: qty 1920

## 2017-03-24 MED ORDER — AMLODIPINE BESYLATE 10 MG PO TABS
10.0000 mg | ORAL_TABLET | Freq: Every day | ORAL | Status: DC
  Administered 2017-03-24 – 2017-03-29 (×5): 10 mg via ORAL
  Filled 2017-03-24 (×5): qty 1

## 2017-03-24 MED ORDER — IOPAMIDOL (ISOVUE-300) INJECTION 61%
INTRAVENOUS | Status: AC
  Administered 2017-03-24: 100 mL
  Filled 2017-03-24: qty 100

## 2017-03-24 MED ORDER — FAT EMULSION 20 % IV EMUL
240.0000 mL | INTRAVENOUS | Status: AC
  Administered 2017-03-24: 240 mL via INTRAVENOUS
  Filled 2017-03-24: qty 250

## 2017-03-24 MED ORDER — MAGNESIUM SULFATE 2 GM/50ML IV SOLN
2.0000 g | Freq: Once | INTRAVENOUS | Status: AC
  Administered 2017-03-24: 2 g via INTRAVENOUS
  Filled 2017-03-24: qty 50

## 2017-03-24 NOTE — Progress Notes (Signed)
Patient c/o nausea and abdomen being full and tight, held tube feeding per patient request. Will continue to monitor.

## 2017-03-24 NOTE — Care Management Note (Signed)
Case Management Note  Patient Details  Name: NUSAIBA GUALLPA MRN: [id number] Date of Birth: [birth year]  Subjective/Objective:                    Action/Plan:   Expected Discharge Date:  02/02/17               Expected Discharge Plan:  Skilled Nursing Facility  In-House Referral:  Clinical Social Work  Discharge planning Services  CM Consult  Post Acute Care Choice:  NA Choice offered to:     DME Arranged:  Other see comment (TNA) DME Agency:  [place] Arranged:  NA [place] Agency:  NA  Status of Service:  In process, will continue to follow  If discussed at Long Length of Stay Meetings, dates discussed:    Additional Comments:  Marilu Favre, RN 03/24/2017, 10:33 AM

## 2017-03-24 NOTE — Progress Notes (Signed)
Patient ID: Kaylee Mullins, female   DOB: Feb 13, 1936, 81 y.o.   MRN: [id number] 14 Days Post-Op   Subjective: Threw up again even with starting tube feeds at 10 ml/hr.  Objective: Vital signs in last 24 hours: Temp:  [98.1 F (36.7 C)-98.2 F (36.8 C)] 98.1 F (36.7 C) (08/08 0436) Pulse Rate:  [83-88] 83 (08/08 0436) Resp:  [17-19] 19 (08/08 0436) BP: (123-172)/(72-88) 172/72 (08/08 0436) SpO2:  [98 %] 98 % (08/08 0436) Weight:  [100.7 kg (221 lb 15.4 oz)] 100.7 kg (221 lb 15.4 oz) (08/08 0436) Last BM Date: 03/22/17  Intake/Output from previous day: 08/07 0701 - 08/08 0700 In: 1000 [I.V.:860; NG/GT:140] Out: 350 [Urine:350] Intake/Output this shift: No intake/output data recorded.  General appearance: alert, cooperative and no distress GI: minimally.  No significant tenderness. G-tube to drainage. No erythema or drainage Extremities: 2+ edema.   Lab Results:   Recent Labs  03/22/17 0543  WBC 7.5  HGB 9.4*  HCT 29.4*  PLT 427*   BMET  Recent Labs  03/22/17 0543  NA 137  K 3.9  CL 105  CO2 26  GLUCOSE 217*  BUN 8  CREATININE 0.71  CALCIUM 8.0*     Studies/Results: No results found.  Anti-infectives: Anti-infectives    Start     Dose/Rate Route Frequency Ordered Stop   03/05/17 0645  fluconazole (DIFLUCAN) tablet 200 mg     200 mg Oral  Once 03/05/17 0636 03/05/17 0657   02/16/17 1500  ceFAZolin (ANCEF) IVPB 2g/100 mL premix     2 g 200 mL/hr over 30 Minutes Intravenous To Radiology 02/16/17 1447 02/16/17 1613   02/09/17 0600  erythromycin (EES) 400 MG/5ML suspension 200 mg  Status:  Discontinued     200 mg Oral Every 6 hours 02/09/17 0147 03/22/17 0920   02/07/17 0000  erythromycin (EES) 400 MG/5ML suspension 400 mg  Status:  Discontinued     400 mg Oral Every 6 hours 02/07/17 1932 02/09/17 0147   02/06/17 0600  erythromycin ethylsuccinate (EES) 200 MG/5ML suspension 400 mg  Status:  Discontinued     400 mg Oral Every 6 hours 02/06/17 0324  02/07/17 1932      Assessment/Plan: s/p Procedure(s): EXPLORATORY LAPAROTOMY Open jejunostomy tube Persisting gastroparesis post-Whipple. Pt not tolerating tube feeds despite 2 weeks of attempts.  Has not been on half goal rate for even 24 hours.    Will get CT scan to look for problems contributing to this prolonged difficultly with tube feeds.  She continues to pass gas and have bowel movements and they are brown at this point, so she is not obstructed.    If CT negative for fixable problem, will plan to send to SNF on TNA.   LOS: 46 days    Loucinda Croy 03/24/2017

## 2017-03-24 NOTE — Progress Notes (Signed)
PHARMACY - ADULT TOTAL PARENTERAL NUTRITION CONSULT NOTE   Pharmacy Consult:  TPN Indication: Ileus  Patient Measurements: Height: 5\' 4"  (162.6 cm) Weight: 221 lb 15.4 oz (100.7 kg) IBW/kg (Calculated) : 54.7 TPN AdjBW (KG): 66.1 Body mass index is 38.1 kg/m.  Assessment:  62 YOF with severe malnutrition related to renal cell and pancreatic cancer who presented with abdominal mass on 01/12/17.  She is s/p Whipple procedure on the same day for pancreatic cancer.  She was discharged 02/05/17 and readmitted same day for vomiting at SNF. Pharmacy consulted to manage TPN for ileus that prevented oral intake and caused significant nausea and vomiting.   Spoke to Dr. [name], hold off on restarting tube feeding due to significant abdominal tightness, nausea and vomiting.  Patient will need full support from TPN until tube feeding could be reinitiated at a later time.  GI: s/p GJ 7/10, malpositioned, J tube placed 7/25.  Prealbumin decreased to 11.5.  No drain O/P charted, LBM 8/6. On PPI IV, PRN Zofran/Phenergan (QTc 427ms on 6/17). Endo: hypothyroid on Synthroid.  DM not on med PTA - CBGs < 180 Insulin requirements in the past 12 hours: 9 units SSI + 50 units in TPN Lytes: all WNL except K+ 3.9 (goal > 4 with ileus) and Mag 1.8 (goal >/= 2 for ileus)  Renal: 8/6 labs - SCr 0.71, BUN WNL - UOP not accurately charted, NS at 25 ml/hr Pulm: stable on RA  Cards: HTN - BP elevated, HR WNL - Norvasc, Toprol, PRN nifedipine XL  Hepatobil: LFTs / tbili normalized, TG WNL Neuro: hx CVA 2016 with residual L-hand and foot weakness - PRN Dilaudid/oxy/Robaxin ID: afebrile, WBC WNL - not on abx AC/Heme: LLE DVT but has large rectus hematoma.  Lovenox stopped 7/17.  IVC filter placed 7/18. Hgb low but stable at 9.4, plts WNL Best Practices: SCDs  TPN Access: double lumen PICC 02/02/17 TPN start date: 01/23/17   Nutritional Goals (per RD recommendation on 8/2): 1800-1950 kCal and 82-93 g protein  Current  Nutrition: TPN   Plan:   - Increase Clinimix E 5/15 to 80 ml/hr and continue 20% ILE at 20 ml/hr x 12 hrs.  TPN will provide 1843 kCal and 96gm of protein per day, meeting 100% of needs. - Daily multivitamin and trace elements in TPN - Continue moderate SSI Q6H + increase insulin in TPN to 70 units - Reduce IVF to [facility] with increasing TPN rate - Mag sulfate 2gm IV x 1 (based on trend) - F/U AM labs, CBGs   Kaylee Mullins D. Mina Marble, PharmD, BCPS Pager:  [phone] 03/24/2017, 10:12 AM

## 2017-03-25 LAB — COMPREHENSIVE METABOLIC PANEL
ALT: 31 U/L (ref 14–54)
AST: 38 U/L (ref 15–41)
Albumin: 2.8 g/dL — ABNORMAL LOW (ref 3.5–5.0)
Alkaline Phosphatase: 99 U/L (ref 38–126)
Anion gap: 7 (ref 5–15)
BUN: 10 mg/dL (ref 6–20)
CO2: 29 mmol/L (ref 22–32)
Calcium: 8.1 mg/dL — ABNORMAL LOW (ref 8.9–10.3)
Chloride: 102 mmol/L (ref 101–111)
Creatinine, Ser: 0.69 mg/dL (ref 0.44–1.00)
GFR calc Af Amer: >60 mL/min (ref >60)
GFR calc non Af Amer: >60 mL/min (ref >60)
Glucose, Bld: 151 mg/dL — ABNORMAL HIGH (ref 65–99)
Potassium: 3.1 mmol/L — ABNORMAL LOW (ref 3.5–5.1)
Sodium: 138 mmol/L (ref 135–145)
Total Bilirubin: 0.6 mg/dL (ref 0.3–1.2)
Total Protein: 7.6 g/dL (ref 6.5–8.1)

## 2017-03-25 LAB — GLUCOSE, CAPILLARY
Glucose-Capillary: 145 mg/dL — ABNORMAL HIGH (ref 65–99)
Glucose-Capillary: 129 mg/dL — ABNORMAL HIGH (ref 65–99)
Glucose-Capillary: 173 mg/dL — ABNORMAL HIGH (ref 65–99)
Glucose-Capillary: 161 mg/dL — ABNORMAL HIGH (ref 65–99)
Glucose-Capillary: 137 mg/dL — ABNORMAL HIGH (ref 65–99)

## 2017-03-25 LAB — PHOSPHORUS: Phosphorus: 3.5 mg/dL (ref 2.5–4.6)

## 2017-03-25 LAB — MAGNESIUM: Magnesium: 2.1 mg/dL (ref 1.7–2.4)

## 2017-03-25 MED ORDER — INSULIN ASPART 100 UNIT/ML ~~LOC~~ SOLN
0.0000 [IU] | SUBCUTANEOUS | Status: DC
  Administered 2017-03-25: 2 [IU] via SUBCUTANEOUS
  Administered 2017-03-25: 3 [IU] via SUBCUTANEOUS
  Administered 2017-03-25: 2 [IU] via SUBCUTANEOUS
  Administered 2017-03-25 – 2017-03-26 (×4): 3 [IU] via SUBCUTANEOUS
  Administered 2017-03-26 (×3): 2 [IU] via SUBCUTANEOUS
  Administered 2017-03-27: 3 [IU] via SUBCUTANEOUS

## 2017-03-25 MED ORDER — POTASSIUM CHLORIDE 10 MEQ/50ML IV SOLN
10.0000 meq | INTRAVENOUS | Status: AC
  Administered 2017-03-25 (×6): 10 meq via INTRAVENOUS
  Filled 2017-03-25 (×6): qty 50

## 2017-03-25 MED ORDER — POLYETHYLENE GLYCOL 3350 17 G PO PACK
17.0000 g | PACK | Freq: Every day | ORAL | Status: DC
Start: 2017-03-25 — End: 2017-03-29
  Administered 2017-03-25 – 2017-03-27 (×3): 17 g via ORAL
  Filled 2017-03-25 (×4): qty 1

## 2017-03-25 MED ORDER — FAT EMULSION 20 % IV EMUL
240.0000 mL | INTRAVENOUS | Status: AC
  Administered 2017-03-25: 240 mL via INTRAVENOUS
  Filled 2017-03-25: qty 250

## 2017-03-25 MED ORDER — TRACE MINERALS CR-CU-MN-SE-ZN 10-1000-500-60 MCG/ML IV SOLN
INTRAVENOUS | Status: AC
  Administered 2017-03-25: 17:00:00 via INTRAVENOUS
  Filled 2017-03-25: qty 1918

## 2017-03-25 MED ORDER — TRACE MINERALS CR-CU-MN-SE-ZN 10-1000-500-60 MCG/ML IV SOLN
INTRAVENOUS | Status: DC
  Filled 2017-03-25: qty 1920

## 2017-03-25 MED ORDER — FAT EMULSION 20 % IV EMUL
240.0000 mL | INTRAVENOUS | Status: DC
  Filled 2017-03-25: qty 250

## 2017-03-25 NOTE — Progress Notes (Signed)
Patient ID: Kaylee Mullins, female   DOB: [birth year], 81 y.o.   MRN: [id number] 15 Days Post-Op   Subjective: Complaining of headache.  BP a bit higher today than previous.  No n/v.  CT did not show any evidence of obstruction, abscess, or other.    Objective: Vital signs in last 24 hours: Temp:  [98.4 F (36.9 C)-98.7 F (37.1 C)] 98.5 F (36.9 C) (08/09 1311) Pulse Rate:  [76-89] 82 (08/09 1311) Resp:  [16] 16 (08/09 1311) BP: (156-180)/(75-97) 172/82 (08/09 1311) SpO2:  [96 %-98 %] 98 % (08/09 1311) Weight:  [96.2 kg (212 lb 1.6 oz)] 96.2 kg (212 lb 1.6 oz) (08/09 0630) Last BM Date: 03/23/17  Intake/Output from previous day: 08/08 0701 - 08/09 0700 In: 1606.3 [I.V.:1546.3] Out: 1100 [Urine:1100] Intake/Output this shift: No intake/output data recorded.  General appearance: alert, cooperative and no distress GI: minimally.  No significant tenderness. G-tube to drainage. No erythema or drainage Extremities: 2+ edema.   Lab Results:  No results for input(s): WBC, HGB, HCT, PLT in the last 72 hours. BMET  Recent Labs  03/25/17 0437  NA 138  K 3.1*  CL 102  CO2 29  GLUCOSE 151*  BUN 10  CREATININE 0.69  CALCIUM 8.1*     Studies/Results: Ct Abdomen Pelvis W Contrast  Result Date: 03/25/2017 CLINICAL DATA:  Status post Whipple procedure, gastrostomy and surgical jejunostomy with persistent intolerance of tube feeds both at the level of the stomach and jejunum and postoperative gastroparesis. Spontaneous left rectus hematoma of related to anti coagulation. EXAM: CT ABDOMEN AND PELVIS WITH CONTRAST TECHNIQUE: Multidetector CT imaging of the abdomen and pelvis was performed using the standard protocol following bolus administration of intravenous contrast. CONTRAST:  155mL ISOVUE-300 IOPAMIDOL (ISOVUE-300) INJECTION 61% COMPARISON:  03/02/2017 and 02/06/2017 unenhanced CT's, 10/23/2016 enhanced CT FINDINGS: Lower chest: No acute abnormality. Hepatobiliary: The abnormal  attenuation within the left lobe of the liver seen by the prior unenhanced CT does not have the typical appearance of metastatic disease on the contrast-enhanced study and appears to represent irregular fatty infiltration as well as potentially some atrophy of the left lobe. There is no associated evidence to suggest biliary obstruction and no evidence of portal or hepatic venous thrombosis. Follow-up is recommended and eventual MRI examination of the liver may be helpful for clarification. Pancreas: Post Whipple anatomy is stable. No abnormal fluid collections or evidence of recurrent masses. Small cystic area measuring 1.2 cm at the tail of the pancreas remains stable. Spleen: Normal in size without focal abnormality. Adrenals/Urinary Tract: Adrenal glands are unremarkable. Contracting renal ablation defect along the medial aspect of the upper pole of the right kidney with nonenhancing scar tissue now measuring roughly 1.9 x 2.7 cm compared to approximately 2.3 x 3.4 cm at a similar level on the prior contrast enhanced study in March. Kidneys show no renal calculi or hydronephrosis. Bladder is unremarkable. Stomach/Bowel: A gastrostomy tube enters the gastric lumen and shows stable position. Coaxial catheter is coiled in the stomach. There is some fluid in the stomach. No evidence of bowel obstruction. Interval placement of jejunostomy tube which enters the left abdominal wall and extends well into the jejunum. Jejunum at the level of the jejunostomy and distally is decompressed and without evidence of obstruction. The colon is decompressed. No free air or abscess. Vascular/Lymphatic: IVC filter has been placed which is in appropriate position. No thrombus is present in the filter. Reproductive: Status post hysterectomy. No adnexal masses. Other: Slight interval  lower density within the large lower rectus sheath hematoma is consistent with evolving hemorrhage. The hematoma is not larger in size. Musculoskeletal:  Stable bony structures with prior fusion at L4-5 and degenerative disc disease at L3-4. IMPRESSION: 1. Irregular low-attenuation within the left lobe of the liver does not have the typical appearance of metastatic disease and appears to represent irregular geographic fatty infiltration with some associated left lobe atrophy. Eventual MRI correlation may be helpful for follow-up. 2. Stable positioning of gastrostomy. 3. Interval jejunostomy appears in good position and extends well into the jejunal lumen without evidence of complication. The jejunum and rest of small bowel are decompressed. 4. Evolving large lower rectus sheath hematoma which shows lower internal density consistent with evolving blood product. No evidence of hematoma enlargement. 5. Contracting renal ablation defect at the upper pole of the right kidney with no evidence of recurrent enhancing neoplasm. Electronically Signed   By: Aletta Edouard M.D.   On: 03/25/2017 00:19    Anti-infectives: Anti-infectives    Start     Dose/Rate Route Frequency Ordered Stop   03/05/17 0645  fluconazole (DIFLUCAN) tablet 200 mg     200 mg Oral  Once 03/05/17 0636 03/05/17 0657   02/16/17 1500  ceFAZolin (ANCEF) IVPB 2g/100 mL premix     2 g 200 mL/hr over 30 Minutes Intravenous To Radiology 02/16/17 1447 02/16/17 1613   02/09/17 0600  erythromycin (EES) 400 MG/5ML suspension 200 mg  Status:  Discontinued     200 mg Oral Every 6 hours 02/09/17 0147 03/22/17 0920   02/07/17 0000  erythromycin (EES) 400 MG/5ML suspension 400 mg  Status:  Discontinued     400 mg Oral Every 6 hours 02/07/17 1932 02/09/17 0147   02/06/17 0600  erythromycin ethylsuccinate (EES) 200 MG/5ML suspension 400 mg  Status:  Discontinued     400 mg Oral Every 6 hours 02/06/17 0324 02/07/17 1932      Assessment/Plan: s/p Procedure(s): EXPLORATORY LAPAROTOMY Open jejunostomy tube Persisting gastroparesis post-Whipple. Pt did not tolerating tube feeds despite 2 weeks of  attempts.  Had not been on half goal rate for even 24 hours.    Ct was negative.  Doing better off tube feeds.    Will plan discharge to SNF on TNA for several weeks prior to restarting tube feed attempts.  Her rectus sheath hematoma may be inhibiting her ability to tolerate feeds. Will try some g tube clamping for ambulating and bathroom breaks.   [place] for d/c tomorrow if bed available.   LOS: 47 days    Kaylee Mullins 03/25/2017

## 2017-03-25 NOTE — Progress Notes (Signed)
Physical Therapy Treatment Patient Details Name: Kaylee Mullins MRN: [id number] DOB: Mar 09, 1936 Today's Date: 03/25/2017    History of Present Illness Pt is a 81 y/o female who was dishcharged and readmitted following new onset nausea at SNF. Pt is s/p whipple placement from previous admission, and had GJ tube placed this admission. Pt with LLE DVT and L abdomen intramuscular hematoma; lovenox discontinued and supposed to have IV filter placed today or tomorrow. PMH includes anxiety, thyroid cancer, pancreatic mass s/p whipple, DM, HTN, CVA with L sided weakness, and s/p lumbar surgery.     PT Comments    Pt progressing towards goals. Increased fatigue this session, so gait limited. Required standing rest during gait secondary to faitigued. Practiced gait without AD, as this was pt's PLOF, however, pt required HHA, use of IV pole and min A to maintain balance secondary to unsteadiness. Also practiced dynamic balance activities without use of AD and pt requiring min to mod A to maintain balance. Feel current recommendations appropriate. Will continue to follow to maximize functional mobility independence and safety.    Follow Up Recommendations  SNF     Equipment Recommendations  3in1 (PT)    Recommendations for Other Services       Precautions / Restrictions Precautions Precautions: Fall Precaution Comments: GJ tube  Required Braces or Orthoses: Other Brace/Splint Other Brace/Splint: abdominal binder (need to use two binders together) Restrictions Weight Bearing Restrictions: No    Mobility  Bed Mobility Overal bed mobility: Needs Assistance Bed Mobility: Sit to Supine   Sidelying to sit: Supervision       General bed mobility comments: supervision for pt safety. Pt requiring cueing for technique.Educated to use log roll technique to decrease pain during transfer.   Transfers Overall transfer level: Needs assistance Equipment used: Rolling walker (2 wheeled) Transfers:  Sit to/from Stand Sit to Stand: Min guard         General transfer comment: Min guard for safety. Demonstrated safe hand placment.   Ambulation/Gait Ambulation/Gait assistance: Min guard;Min assist Ambulation Distance (Feet): 75 Feet Assistive device: Rolling walker (2 wheeled) Gait Pattern/deviations: Step-through pattern;Decreased dorsiflexion - left;Decreased dorsiflexion - right Gait velocity: decreased Gait velocity interpretation: Below normal speed for age/gender General Gait Details: Slow, cautious gait. Pt reporting increased fatigue, therefore ambulation limited to 75'X 2. Required standing rest break during session. Attempted gait without AD, however, pt unable to perform with UE support. Required HHA and min A for steadying without use of AD. Pt with LOB X 1 and required min A for steadying.    Stairs            Wheelchair Mobility    Modified Rankin (Stroke Patients Only)       Balance Overall balance assessment: Needs assistance Sitting-balance support: Feet supported;No upper extremity supported Sitting balance-Leahy Scale: Good     Standing balance support: Bilateral upper extremity supported;During functional activity Standing balance-Leahy Scale: Poor Standing balance comment: Reliant on BUE support during gait and dynamic balance tasks.              High level balance activites: Head turns High Level Balance Comments: Performed head turns during gait, and pt demonstrating increased unsteadiness. Min guard for assist with steadying.             Cognition Arousal/Alertness: Awake/alert Behavior During Therapy: Flat affect Overall Cognitive Status: Within Functional Limits for tasks assessed  Exercises Other Exercises Other Exercises: Standing marches to practice SLS X 10 on BLE. Min to mod A and HHA required to maintain balance.  Other Exercises: Heel and toe rises for dynamic  balance activities X 10; required min A and HHA to prevent LOB.     General Comments        Pertinent Vitals/Pain Pain Assessment: Faces Faces Pain Scale: Hurts a little bit Pain Location: abdomen Pain Descriptors / Indicators: Discomfort Pain Intervention(s): Limited activity within patient's tolerance;Monitored during session;Repositioned    Home Living                      Prior Function            PT Goals (current goals can now be found in the care plan section) Acute Rehab PT Goals Patient Stated Goal: to get better  PT Goal Formulation: With patient Time For Goal Achievement: 04/01/17 Potential to Achieve Goals: Good Progress towards PT goals: Progressing toward goals    Frequency    Min 2X/week      PT Plan Current plan remains appropriate    Co-evaluation              AM-PAC PT "6 Clicks" Daily Activity  Outcome Measure  Difficulty turning over in bed (including adjusting bedclothes, sheets and blankets)?: None Difficulty moving from lying on back to sitting on the side of the bed? : A Little Difficulty sitting down on and standing up from a chair with arms (e.g., wheelchair, bedside commode, etc,.)?: Total Help needed moving to and from a bed to chair (including a wheelchair)?: A Little Help needed walking in hospital room?: A Little Help needed climbing 3-5 steps with a railing? : A Lot 6 Click Score: 16    End of Session Equipment Utilized During Treatment: Gait belt Activity Tolerance: Patient limited by fatigue Patient left: in chair;with call bell/phone within reach;with family/visitor present Nurse Communication: Mobility status PT Visit Diagnosis: Difficulty in walking, not elsewhere classified (R26.2);Muscle weakness (generalized) (M62.81);Pain Pain - part of body:  (abdomen )     Time: 5638-7564 PT Time Calculation (min) (ACUTE ONLY): 25 min  Charges:  $Gait Training: 8-22 mins $Neuromuscular Re-education: 8-22  mins                    G Codes:       Kaylee Mullins, PT, DPT  Acute Rehabilitation Services  Pager: [phone]    Kaylee Mullins 03/25/2017, 4:31 PM

## 2017-03-25 NOTE — Progress Notes (Addendum)
PHARMACY - ADULT TOTAL PARENTERAL NUTRITION CONSULT NOTE   Pharmacy Consult:  TPN Indication: Ileus / Intolerance to EN  Patient Measurements: Height: 5\' 4"  (162.6 cm) Weight: 212 lb 1.6 oz (96.2 kg) IBW/kg (Calculated) : 54.7 TPN AdjBW (KG): 66.1 Body mass index is 36.41 kg/m.  Assessment:  13 YOF with severe malnutrition related to renal cell and pancreatic cancer who presented with abdominal mass on 01/12/17.  She is s/p Whipple procedure on the same day for pancreatic cancer.  She was discharged 02/05/17 and readmitted same day for vomiting at SNF. Pharmacy consulted to manage TPN for ileus that prevented oral intake and caused significant nausea and vomiting.   Spoke to Dr. [name] on 03/24/17, hold off on restarting tube feeding due to significant abdominal tightness, nausea and vomiting.  Patient needs full support from TPN until tube feeding could be reinitiated at a later time.  GI: s/p GJ 7/10, malpositioned, J tube placed 7/25.  Prealbumin decreased to 11.5.  No drain O/P charted, LBM 8/6. On PPI IV, PRN Zofran/Phenergan (QTc 468ms on 6/17). Endo: hypothyroid on Synthroid.  DM not on med PTA - CBGs controlled Insulin requirements in the past 12 hours: 11 units SSI + 70 units in TPN Lytes: all WNL except K+ 3.1 (goal >/= 4 with ileus)  Renal: SCr 0.69, BUN WNL - UOP 0.5 ml/kg/hr, NS at 10 ml/hr Pulm: stable on RA  Cards: HTN - BP elevated, HR WNL - Norvasc, Toprol, PRN nifedipine XL  Hepatobil: LFTs / tbili normalized, TG WNL Neuro: hx CVA 2016 with residual L-hand and foot weakness - PRN Dilaudid/oxy/Robaxin ID: afebrile, WBC WNL - not on abx AC/Heme: LLE DVT but has large rectus hematoma.  Lovenox stopped 7/17.  IVC filter placed 7/18. Hgb low but stable at 9.4, plts elevated Best Practices: SCDs  TPN Access: double lumen PICC 02/02/17 TPN start date: 01/23/17   Nutritional Goals (per RD recommendation on 8/2): 1800-1950 kCal and 82-93 g protein  Current  Nutrition: TPN   Plan:   - Attempt cyclic TPN, Clinimix E 3/23, infuse 1918 mls over 20 hours:  50 ml/hr x 1 hr, then 101 ml/hr x 18 hrs, then 50 ml/hr x 1 hr. - 20% ILE at 20 ml/hr x 12 hrs - TPN provides 1842 kCal and 96 gm of protein per day, meeting 100% of total needs - Daily multivitamin and trace elements in TPN - Change moderate SSI to Q4H (0500, 1000, 1500, 1700, 2000, 2300).  Plan to reduce frequency once cycle is established and CBGs are stable. - Continue 70 units of insulin in TPN - KCL x 6 runs - F/U AM labs, resume home meds for better BP control (ARB +/- HCTZ), CT result, CBGs (unable to advance beyond 16-hr cycle in the past d/t uncontrolled CBGs)   Kaylee Mullins D. Mina Marble, PharmD, BCPS Pager:  [phone] 03/25/2017, 7:26 AM

## 2017-03-25 NOTE — Progress Notes (Signed)
[facility] team is prepared for DC on Friday if ordered. I have spoken with Lexine Baton CSW at [facility] to coordinate care for DC and resuming TNA there tomorrow. We will follow and support DC tomorrow.  If patient discharges after hours, please call [phone].   Larry Sierras 03/25/2017, 12:14 PM

## 2017-03-25 NOTE — Care Management Note (Signed)
Case Management Note  Patient Details  Name: MASSIE COGLIANO MRN: [id number] Date of Birth: 06-12-1936  Subjective/Objective:                    Action/Plan: Plan to return to [facility] tomorrow with TPN through [facility]. Pam with [facility] following and aware. Requested orders for TNA entered.  Expected Discharge Date:  02/02/17               Expected Discharge Plan:  Skilled Nursing Facility  In-House Referral:  Clinical Social Work  Discharge planning Services  CM Consult  Post Acute Care Choice:  NA Choice offered to:     DME Arranged:  Other see comment (TNA) DME Agency:  [place] Arranged:  NA [place] Agency:  NA  Status of Service:  In process, will continue to follow  If discussed at Long Length of Stay Meetings, dates discussed:    Additional Comments:  Marilu Favre, RN 03/25/2017, 11:15 AM

## 2017-03-26 LAB — BASIC METABOLIC PANEL
Anion gap: 8 (ref 5–15)
BUN: 15 mg/dL (ref 6–20)
CO2: 24 mmol/L (ref 22–32)
Calcium: 8.2 mg/dL — ABNORMAL LOW (ref 8.9–10.3)
Chloride: 105 mmol/L (ref 101–111)
Creatinine, Ser: 0.7 mg/dL (ref 0.44–1.00)
GFR calc Af Amer: >60 mL/min (ref >60)
GFR calc non Af Amer: >60 mL/min (ref >60)
Glucose, Bld: 142 mg/dL — ABNORMAL HIGH (ref 65–99)
Potassium: 3.7 mmol/L (ref 3.5–5.1)
Sodium: 137 mmol/L (ref 135–145)

## 2017-03-26 LAB — GLUCOSE, CAPILLARY
Glucose-Capillary: 136 mg/dL — ABNORMAL HIGH (ref 65–99)
Glucose-Capillary: 141 mg/dL — ABNORMAL HIGH (ref 65–99)
Glucose-Capillary: 173 mg/dL — ABNORMAL HIGH (ref 65–99)
Glucose-Capillary: 151 mg/dL — ABNORMAL HIGH (ref 65–99)
Glucose-Capillary: 182 mg/dL — ABNORMAL HIGH (ref 65–99)
Glucose-Capillary: 149 mg/dL — ABNORMAL HIGH (ref 65–99)
Glucose-Capillary: 126 mg/dL — ABNORMAL HIGH (ref 65–99)

## 2017-03-26 MED ORDER — POTASSIUM CHLORIDE 10 MEQ/50ML IV SOLN
10.0000 meq | INTRAVENOUS | Status: AC
  Administered 2017-03-26 (×3): 10 meq via INTRAVENOUS
  Filled 2017-03-26 (×3): qty 50

## 2017-03-26 MED ORDER — FAT EMULSION 20 % IV EMUL
240.0000 mL | INTRAVENOUS | Status: AC
  Administered 2017-03-26: 240 mL via INTRAVENOUS
  Filled 2017-03-26: qty 250

## 2017-03-26 MED ORDER — OXYCODONE HCL 5 MG/5ML PO SOLN: 5.0000 mg | ORAL | 0 refills | Status: DC | PRN

## 2017-03-26 MED ORDER — TRACE MINERALS CR-CU-MN-SE-ZN 10-1000-500-60 MCG/ML IV SOLN
INTRAVENOUS | Status: AC
  Administered 2017-03-26: 18:00:00 via INTRAVENOUS
  Filled 2017-03-26: qty 1918

## 2017-03-26 MED ORDER — POLYETHYLENE GLYCOL 3350 17 G PO PACK: 17.0000 g | PACK | Freq: Every day | ORAL | 0 refills | Status: DC

## 2017-03-26 NOTE — Progress Notes (Signed)
Removed 13 staples from patient abdomen.  Site clean and dry.

## 2017-03-26 NOTE — Progress Notes (Signed)
Patient ID: Kaylee Mullins, female   DOB: 1935/12/28, 81 y.o.   MRN: [id number] 16 Days Post-Op   Subjective: Continues to feel a little better while not being fed.     Objective: Vital signs in last 24 hours: Temp:  [98.3 F (36.8 C)-98.7 F (37.1 C)] 98.3 F (36.8 C) (08/10 0546) Pulse Rate:  [80-84] 80 (08/10 0546) Resp:  [17] 17 (08/10 0546) BP: (154-158)/(71-73) 154/71 (08/10 0546) SpO2:  [99 %-100 %] 99 % (08/10 0546) Weight:  [95.1 kg (209 lb 10.5 oz)] 95.1 kg (209 lb 10.5 oz) (08/10 0546) Last BM Date: 03/25/17  Intake/Output from previous day: 08/09 0701 - 08/10 0700 In: 1871.3 [I.V.:1624.3; IV Piggyback:227] Out: 2000 [Urine:1700; Drains:300] Intake/Output this shift: No intake/output data recorded.  General appearance: alert, cooperative and no distress GI: minimally.  No significant tenderness. G-tube to drainage. No erythema or drainage Extremities: 2+ edema.   Lab Results:  No results for input(s): WBC, HGB, HCT, PLT in the last 72 hours. BMET  Recent Labs  03/25/17 0437 03/26/17 0420  NA 138 137  K 3.1* 3.7  CL 102 105  CO2 29 24  GLUCOSE 151* 142*  BUN 10 15  CREATININE 0.69 0.70  CALCIUM 8.1* 8.2*     Studies/Results: Ct Abdomen Pelvis W Contrast  Result Date: 03/25/2017 CLINICAL DATA:  Status post Whipple procedure, gastrostomy and surgical jejunostomy with persistent intolerance of tube feeds both at the level of the stomach and jejunum and postoperative gastroparesis. Spontaneous left rectus hematoma of related to anti coagulation. EXAM: CT ABDOMEN AND PELVIS WITH CONTRAST TECHNIQUE: Multidetector CT imaging of the abdomen and pelvis was performed using the standard protocol following bolus administration of intravenous contrast. CONTRAST:  162mL ISOVUE-300 IOPAMIDOL (ISOVUE-300) INJECTION 61% COMPARISON:  03/02/2017 and 02/06/2017 unenhanced CT's, 10/23/2016 enhanced CT FINDINGS: Lower chest: No acute abnormality. Hepatobiliary: The abnormal  attenuation within the left lobe of the liver seen by the prior unenhanced CT does not have the typical appearance of metastatic disease on the contrast-enhanced study and appears to represent irregular fatty infiltration as well as potentially some atrophy of the left lobe. There is no associated evidence to suggest biliary obstruction and no evidence of portal or hepatic venous thrombosis. Follow-up is recommended and eventual MRI examination of the liver may be helpful for clarification. Pancreas: Post Whipple anatomy is stable. No abnormal fluid collections or evidence of recurrent masses. Small cystic area measuring 1.2 cm at the tail of the pancreas remains stable. Spleen: Normal in size without focal abnormality. Adrenals/Urinary Tract: Adrenal glands are unremarkable. Contracting renal ablation defect along the medial aspect of the upper pole of the right kidney with nonenhancing scar tissue now measuring roughly 1.9 x 2.7 cm compared to approximately 2.3 x 3.4 cm at a similar level on the prior contrast enhanced study in March. Kidneys show no renal calculi or hydronephrosis. Bladder is unremarkable. Stomach/Bowel: A gastrostomy tube enters the gastric lumen and shows stable position. Coaxial catheter is coiled in the stomach. There is some fluid in the stomach. No evidence of bowel obstruction. Interval placement of jejunostomy tube which enters the left abdominal wall and extends well into the jejunum. Jejunum at the level of the jejunostomy and distally is decompressed and without evidence of obstruction. The colon is decompressed. No free air or abscess. Vascular/Lymphatic: IVC filter has been placed which is in appropriate position. No thrombus is present in the filter. Reproductive: Status post hysterectomy. No adnexal masses. Other: Slight interval lower density  within the large lower rectus sheath hematoma is consistent with evolving hemorrhage. The hematoma is not larger in size. Musculoskeletal:  Stable bony structures with prior fusion at L4-5 and degenerative disc disease at L3-4. IMPRESSION: 1. Irregular low-attenuation within the left lobe of the liver does not have the typical appearance of metastatic disease and appears to represent irregular geographic fatty infiltration with some associated left lobe atrophy. Eventual MRI correlation may be helpful for follow-up. 2. Stable positioning of gastrostomy. 3. Interval jejunostomy appears in good position and extends well into the jejunal lumen without evidence of complication. The jejunum and rest of small bowel are decompressed. 4. Evolving large lower rectus sheath hematoma which shows lower internal density consistent with evolving blood product. No evidence of hematoma enlargement. 5. Contracting renal ablation defect at the upper pole of the right kidney with no evidence of recurrent enhancing neoplasm. Electronically Signed   By: Aletta Edouard M.D.   On: 03/25/2017 00:19    Anti-infectives: Anti-infectives    Start     Dose/Rate Route Frequency Ordered Stop   03/05/17 0645  fluconazole (DIFLUCAN) tablet 200 mg     200 mg Oral  Once 03/05/17 0636 03/05/17 0657   02/16/17 1500  ceFAZolin (ANCEF) IVPB 2g/100 mL premix     2 g 200 mL/hr over 30 Minutes Intravenous To Radiology 02/16/17 1447 02/16/17 1613   02/09/17 0600  erythromycin (EES) 400 MG/5ML suspension 200 mg  Status:  Discontinued     200 mg Oral Every 6 hours 02/09/17 0147 03/22/17 0920   02/07/17 0000  erythromycin (EES) 400 MG/5ML suspension 400 mg  Status:  Discontinued     400 mg Oral Every 6 hours 02/07/17 1932 02/09/17 0147   02/06/17 0600  erythromycin ethylsuccinate (EES) 200 MG/5ML suspension 400 mg  Status:  Discontinued     400 mg Oral Every 6 hours 02/06/17 0324 02/07/17 1932      Assessment/Plan: s/p Procedure(s): EXPLORATORY LAPAROTOMY Open jejunostomy tube Persisting gastroparesis post-Whipple. Pt did not tolerating tube feeds despite 2 weeks of  attempts.  Had not been on half goal rate for even 24 hours.    Ct was negative.  Doing better off tube feeds.    Will plan discharge to SNF on TNA for several weeks prior to restarting tube feed attempts.  Her rectus sheath hematoma may be inhibiting her ability to tolerate feeds. Continue g tube clamping for ambulating and bathroom breaks.   Plan d/c Monday to [facility] with TNA. Oral antihypertensives.     LOS: 48 days    Shaunta Oncale 03/26/2017

## 2017-03-26 NOTE — Progress Notes (Signed)
PHARMACY - ADULT TOTAL PARENTERAL NUTRITION CONSULT NOTE   Pharmacy Consult:  TPN Indication: Ileus / Intolerance to EN  Patient Measurements: Height: 5\' 4"  (162.6 cm) Weight: 209 lb 10.5 oz (95.1 kg) IBW/kg (Calculated) : 54.7 TPN AdjBW (KG): 66.1 Body mass index is 35.99 kg/m.  Assessment:  89 YOF with severe malnutrition related to renal cell and pancreatic cancer who presented with abdominal mass on 01/12/17.  She is s/p Whipple procedure on the same day for pancreatic cancer.  She was discharged 02/05/17 and readmitted same day for vomiting at SNF. Pharmacy consulted to manage TPN for ileus that prevented oral intake and caused significant nausea and vomiting.   GI: s/p GJ 7/10, malpositioned, J tube placed 7/25, TF held 8/8 d/t abd distention/N/V, reinitiate at a later time.  Prealbumin decreased to 11.5.  Drain O/P 359mL, LBM 8/6. On PPI IV, Miralax, PRN Zofran/Phenergan (QTc 450ms on 6/17). Endo: hypothyroid on Synthroid.  DM not on med PTA - CBGs controlled Insulin requirements in the past 12 hours: 14 units SSI + 70 units in TPN Lytes: all WNL except K+ 3.7 (goal >/= 4 with ileus)  Renal: SCr 0.7 stable, BUN WNL - UOP 0.7 ml/kg/hr, NS at 10 ml/hr Pulm: stable on RA  Cards: HTN - BP elevated but improving, HR WNL - Norvasc, Toprol, PRN nifedipine XL  Hepatobil: LFTs / tbili normalized, TG WNL Neuro: hx CVA 2016 with residual L-hand and foot weakness - PRN Dilaudid/oxy/Robaxin ID: afebrile, WBC WNL - not on abx AC/Heme: LLE DVT but has large rectus hematoma.  Lovenox stopped 7/17.  IVC filter placed 7/18. Hgb low but stable at 9.4, plts elevated Best Practices: SCDs  TPN Access: double lumen PICC 02/02/17 TPN start date: 01/23/17   Nutritional Goals (per RD recommendation on 8/2): 1800-1950 kCal and 82-93 g protein  Current Nutrition: TPN   Plan:   - Continue cyclic Clinimix E 6/71, infuse 1918 mls over 20 hours:  50 ml/hr x 1 hr, then 101 ml/hr x 18 hrs, then 50 ml/hr x 1  hr. - 20% ILE at 20 ml/hr x 12 hrs - TPN provides 1842 kCal and 96 gm of protein per day, meeting 100% of total needs - Daily multivitamin and trace elements in TPN - Continue moderate SSI Q4H (0500, 1000, 1500, 1700, 2000, 2300).  Reduce frequency once cycle is established and CBGs are stable. - Continue 70 units of insulin in TPN - KCL x 3 runs - Consider scheduling nifedipine for better BP control - F/U CBGs to reduce cyclic schedule (unable to advance beyond 16-hr cycle in the past d/t uncontrolled CBGs), potential discharge    Miryah Ralls D. Mina Marble, PharmD, BCPS Pager:  [phone] 03/26/2017, 7:20 AM

## 2017-03-26 NOTE — Discharge Instructions (Signed)
[facility]      [facility] °[phone] ° °ABDOMINAL SURGERY: POST OP INSTRUCTIONS ° °Always review your discharge instruction sheet given to you by the facility where your surgery was performed. ° °IF YOU HAVE DISABILITY OR FAMILY LEAVE FORMS, YOU MUST BRING THEM TO THE OFFICE FOR PROCESSING.  PLEASE DO NOT GIVE THEM TO YOUR DOCTOR. ° °1. A prescription for pain medication may be given to you upon discharge.  Take your pain medication as prescribed, if needed.  If narcotic pain medicine is not needed, then you may take acetaminophen (Tylenol) or ibuprofen (Advil) as needed. °2. Take your usually prescribed medications unless otherwise directed. °3. If you need a refill on your pain medication, please contact your pharmacy. They will contact our office to request authorization.  Prescriptions will not be filled after 5pm or on week-ends. °4. You should follow a light diet the first few days after arrival home, such as soup and crackers, pudding, etc.unless your doctor has advised otherwise. A high-fiber, low fat diet can be resumed as tolerated.   Be sure to include lots of fluids daily. Most patients will experience some swelling and bruising on the chest and neck area.  Ice packs will help.  Swelling and bruising can take several days to resolve °5. Most patients will experience some swelling and bruising in the area of the incision. Ice pack will help. Swelling and bruising can take several days to resolve..  °6. It is common to experience some constipation if taking pain medication after surgery.  Increasing fluid intake and taking a stool softener will usually help or prevent this problem from occurring.  A mild laxative (Milk of Magnesia or Miralax) should be taken according to package directions if there are no bowel movements after 48 hours. °7.  You may have steri-strips (small skin tapes) in place directly over the incision.  These strips should be left on the skin for 10-14 days.  If your  surgeon used skin glue on the incision, you may shower in 48 hours.  The glue will flake off over the next 2-3 weeks.  Any sutures or staples will be removed at the office during your follow-up visit. You may find that a light gauze bandage over your incision may keep your staples from being rubbed or pulled. You may shower and replace the bandage daily. °8. ACTIVITIES:  You may resume regular (light) daily activities beginning the next day--such as daily self-care, walking, climbing stairs--gradually increasing activities as tolerated.  You may have sexual intercourse when it is comfortable.  Refrain from any heavy lifting or straining until approved by your doctor. °a. You may drive when you no longer are taking prescription pain medication, you can comfortably wear a seatbelt, and you can safely maneuver your car and apply brakes °b. Return to Work: __________8 weeks if applicable_________________________ °9. You should see your doctor in the office for a follow-up appointment approximately two weeks after your surgery.  Make sure that you call for this appointment within a day or two after you arrive home to insure a convenient appointment time. °OTHER INSTRUCTIONS:  °_____________________________________________________________ °_____________________________________________________________ ° °WHEN TO CALL YOUR DOCTOR: °1. Fever over 101.0 °2. Inability to urinate °3. Nausea and/or vomiting °4. Extreme swelling or bruising °5. Continued bleeding from incision. °6. Increased pain, redness, or drainage from the incision. °7. Difficulty swallowing or breathing °8. Muscle cramping or spasms. °9. Numbness or tingling in hands or feet or around lips. ° °The clinic staff is   available to answer your questions during regular business hours.  Please don’t hesitate to call and ask to speak to one of the nurses if you have concerns. ° °For further questions, please visit [email or web address] ° ° ° °

## 2017-03-27 LAB — BASIC METABOLIC PANEL
Anion gap: 7 (ref 5–15)
BUN: 19 mg/dL (ref 6–20)
CO2: 20 mmol/L — ABNORMAL LOW (ref 22–32)
Calcium: 6.9 mg/dL — ABNORMAL LOW (ref 8.9–10.3)
Chloride: 112 mmol/L — ABNORMAL HIGH (ref 101–111)
Creatinine, Ser: 0.65 mg/dL (ref 0.44–1.00)
GFR calc Af Amer: >60 mL/min (ref >60)
GFR calc non Af Amer: >60 mL/min (ref >60)
Glucose, Bld: 140 mg/dL — ABNORMAL HIGH (ref 65–99)
Potassium: 3.4 mmol/L — ABNORMAL LOW (ref 3.5–5.1)
Sodium: 139 mmol/L (ref 135–145)

## 2017-03-27 LAB — GLUCOSE, CAPILLARY
Glucose-Capillary: 154 mg/dL — ABNORMAL HIGH (ref 65–99)
Glucose-Capillary: 107 mg/dL — ABNORMAL HIGH (ref 65–99)
Glucose-Capillary: 160 mg/dL — ABNORMAL HIGH (ref 65–99)
Glucose-Capillary: 171 mg/dL — ABNORMAL HIGH (ref 65–99)
Glucose-Capillary: 176 mg/dL — ABNORMAL HIGH (ref 65–99)
Glucose-Capillary: 159 mg/dL — ABNORMAL HIGH (ref 65–99)

## 2017-03-27 MED ORDER — POTASSIUM CHLORIDE 10 MEQ/50ML IV SOLN
10.0000 meq | INTRAVENOUS | Status: AC
  Administered 2017-03-27 (×5): 10 meq via INTRAVENOUS
  Filled 2017-03-27 (×5): qty 50

## 2017-03-27 MED ORDER — OXYCODONE HCL 5 MG/5ML PO SOLN: 5.0000 mg | Freq: Four times a day (QID) | ORAL | Status: DC | PRN

## 2017-03-27 MED ORDER — MORPHINE SULFATE (PF) 4 MG/ML IV SOLN: 1.0000 mg | INTRAVENOUS | Status: DC | PRN

## 2017-03-27 MED ORDER — SODIUM CHLORIDE 0.9 % IV SOLN
1.0000 g | Freq: Once | INTRAVENOUS | Status: AC
  Administered 2017-03-27: 1 g via INTRAVENOUS
  Filled 2017-03-27: qty 10

## 2017-03-27 MED ORDER — FAT EMULSION 20 % IV EMUL
240.0000 mL | INTRAVENOUS | Status: AC
  Administered 2017-03-27: 240 mL via INTRAVENOUS
  Filled 2017-03-27: qty 250

## 2017-03-27 MED ORDER — METHOCARBAMOL 1000 MG/10ML IJ SOLN
1000.0000 mg | Freq: Two times a day (BID) | INTRAVENOUS | Status: DC | PRN
  Administered 2017-03-27: 1000 mg via INTRAVENOUS
  Filled 2017-03-27: qty 10

## 2017-03-27 MED ORDER — TRACE MINERALS CR-CU-MN-SE-ZN 10-1000-500-60 MCG/ML IV SOLN
INTRAVENOUS | Status: AC
  Administered 2017-03-27: 19:00:00 via INTRAVENOUS
  Filled 2017-03-27: qty 1942

## 2017-03-27 MED ORDER — PROMETHAZINE HCL 25 MG/ML IJ SOLN: 12.5000 mg | Freq: Four times a day (QID) | INTRAMUSCULAR | Status: DC | PRN

## 2017-03-27 MED ORDER — INSULIN ASPART 100 UNIT/ML ~~LOC~~ SOLN
0.0000 [IU] | SUBCUTANEOUS | Status: DC
  Administered 2017-03-27 (×4): 3 [IU] via SUBCUTANEOUS
  Administered 2017-03-28: 2 [IU] via SUBCUTANEOUS
  Administered 2017-03-28 (×3): 3 [IU] via SUBCUTANEOUS
  Administered 2017-03-29 (×2): 2 [IU] via SUBCUTANEOUS
  Administered 2017-03-29: 5 [IU] via SUBCUTANEOUS
  Administered 2017-03-29: 2 [IU] via SUBCUTANEOUS

## 2017-03-27 NOTE — Progress Notes (Signed)
Patient ID: Kaylee Mullins, female   DOB: May 01, 1936, 81 y.o.   MRN: [id number] 17 Days Post-Op   Subjective: Pt difficult to arouse this am. Vitals stable.  Opens eyes to commands but would not respond to questions     Objective: Vital signs in last 24 hours: Temp:  [98.3 F (36.8 C)-98.8 F (37.1 C)] 98.3 F (36.8 C) (08/11 0503) Pulse Rate:  [81-86] 81 (08/11 0503) Resp:  [18] 18 (08/11 0503) BP: (159-169)/(70-79) 169/72 (08/11 0503) SpO2:  [99 %-100 %] 100 % (08/11 0503) Weight:  [97.2 kg (214 lb 3.2 oz)] 97.2 kg (214 lb 3.2 oz) (08/11 0503) Last BM Date: 03/25/17  Intake/Output from previous day: 08/10 0701 - 08/11 0700 In: 1698 [I.V.:1698] Out: 900 [Urine:900] Intake/Output this shift: No intake/output data recorded.  General appearance: alert, cooperative and no distress GI: minimally.  No significant tenderness. G-tube to drainage. No erythema or drainage Extremities: 2+ edema.   Lab Results:  No results for input(s): WBC, HGB, HCT, PLT in the last 72 hours. BMET  Recent Labs  03/26/17 0420 03/27/17 0427  NA 137 139  K 3.7 3.4*  CL 105 112*  CO2 24 20*  GLUCOSE 142* 140*  BUN 15 19  CREATININE 0.70 0.65  CALCIUM 8.2* 6.9*     Studies/Results: No results found.  Anti-infectives: Anti-infectives    Start     Dose/Rate Route Frequency Ordered Stop   03/05/17 0645  fluconazole (DIFLUCAN) tablet 200 mg     200 mg Oral  Once 03/05/17 0636 03/05/17 0657   02/16/17 1500  ceFAZolin (ANCEF) IVPB 2g/100 mL premix     2 g 200 mL/hr over 30 Minutes Intravenous To Radiology 02/16/17 1447 02/16/17 1613   02/09/17 0600  erythromycin (EES) 400 MG/5ML suspension 200 mg  Status:  Discontinued     200 mg Oral Every 6 hours 02/09/17 0147 03/22/17 0920   02/07/17 0000  erythromycin (EES) 400 MG/5ML suspension 400 mg  Status:  Discontinued     400 mg Oral Every 6 hours 02/07/17 1932 02/09/17 0147   02/06/17 0600  erythromycin ethylsuccinate (EES) 200 MG/5ML  suspension 400 mg  Status:  Discontinued     400 mg Oral Every 6 hours 02/06/17 0324 02/07/17 1932      Assessment/Plan: s/p Procedure(s): EXPLORATORY LAPAROTOMY Open jejunostomy tube Persisting gastroparesis post-Whipple. Pt did not tolerating tube feeds despite 2 weeks of attempts.  Had not been on half goal rate for even 24 hours.    Ct was negative.  Doing better off tube feeds.    Will plan discharge to SNF on TNA for several weeks prior to restarting tube feed attempts.  Her rectus sheath hematoma may be inhibiting her ability to tolerate feeds. Continue g tube clamping for ambulating and bathroom breaks.   Pt appears over sedated this am. No resp distress.  All sedating meds decreased.  Plan d/c Monday to [facility] with TNA. Oral antihypertensives.     LOS: 49 days    [name] C. 6/50/3546

## 2017-03-27 NOTE — Progress Notes (Signed)
PHARMACY - ADULT TOTAL PARENTERAL NUTRITION CONSULT NOTE   Pharmacy Consult:  TPN Indication: Ileus / Intolerance to EN  Patient Measurements: Height: 5\' 4"  (162.6 cm) Weight: 214 lb 3.2 oz (97.2 kg) IBW/kg (Calculated) : 54.7 TPN AdjBW (KG): 66.1 Body mass index is 36.77 kg/m.  Assessment:  73 YOF with severe malnutrition related to renal cell and pancreatic cancer who presented with abdominal mass on 01/12/17.  She is s/p Whipple procedure on the same day for pancreatic cancer.  She was discharged 02/05/17 and readmitted same day for vomiting at SNF. Pharmacy consulted to manage TPN for ileus that prevented oral intake and caused significant nausea and vomiting.   GI: s/p GJ 7/10, malpositioned, J tube placed 7/25, TF held 8/8 d/t abd distention/N/V, reinitiate at a later time.  Prealbumin decreased to 11.5.  Drain O/P not charted, BM x1. On PPI IV, Miralax, PRN Zofran/Phenergan (QTc 43ms on 6/17). Endo: hypothyroid on Synthroid.  DM not on med PTA - CBGs acceptable (149-182 off TPN, 126-173 on TPN) Insulin requirements in the past 12 hours: 15 units SSI + 70 units in TPN Lytes: K+ 3.4 (goal >/= 4 with ileus), CL slightly high, CO2 low, CoCa low at 7.86. 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  Lovenox stopped 7/17.  IVC filter placed 7/18. Hgb low but stable at 9.4, plts elevated Best Practices: SCDs  TPN Access: double lumen PICC 02/02/17 TPN start date: 01/23/17   Nutritional Goals (per RD recommendation on 8/2): 1800-1950 kCal and 82-93 g protein  Current Nutrition: TPN   Plan:   - Continue cyclic Clinimix E 7/09, infuse 1942 mls over 20 hours:  75  ml/hr x 1 hr, then 112 ml/hr x 16 hrs, then 75 ml/hr x 1 hr. - 20% ILE at 20 ml/hr x 12 hrs - TPN provides 1859 kCal and 97 gm of protein per day, meeting 100% of total needs - Daily multivitamin and trace elements in TPN - Continue moderate SSI 6x/day (0500, 0800, 1300, 1600, 2000, 2300).  Reduce frequency once cycle is established and CBGs are stable. - Continue 70 units of insulin in TPN - KCL x 5 runs - Ca gluconate 1 gm IV x 1 - Consider scheduling nifedipine for better BP control - F/U AM labs, CBGs to reduce cyclic schedule (unable to advance beyond 16-hr cycle in the past d/t uncontrolled CBGs)   Steffanie Mingle D. Mina Marble, PharmD, BCPS Pager:  [phone] 03/27/2017, 7:26 AM

## 2017-03-28 LAB — BASIC METABOLIC PANEL
Anion gap: 6 (ref 5–15)
BUN: 26 mg/dL — ABNORMAL HIGH (ref 6–20)
CO2: 25 mmol/L (ref 22–32)
Calcium: 8.4 mg/dL — ABNORMAL LOW (ref 8.9–10.3)
Chloride: 105 mmol/L (ref 101–111)
Creatinine, Ser: 0.95 mg/dL (ref 0.44–1.00)
GFR calc Af Amer: >60 mL/min (ref >60)
GFR calc non Af Amer: 56 mL/min — ABNORMAL LOW (ref >60)
Glucose, Bld: 157 mg/dL — ABNORMAL HIGH (ref 65–99)
Potassium: 4.3 mmol/L (ref 3.5–5.1)
Sodium: 136 mmol/L (ref 135–145)

## 2017-03-28 LAB — GLUCOSE, CAPILLARY
Glucose-Capillary: 155 mg/dL — ABNORMAL HIGH (ref 65–99)
Glucose-Capillary: 157 mg/dL — ABNORMAL HIGH (ref 65–99)
Glucose-Capillary: 160 mg/dL — ABNORMAL HIGH (ref 65–99)
Glucose-Capillary: 177 mg/dL — ABNORMAL HIGH (ref 65–99)
Glucose-Capillary: 164 mg/dL — ABNORMAL HIGH (ref 65–99)
Glucose-Capillary: 141 mg/dL — ABNORMAL HIGH (ref 65–99)

## 2017-03-28 LAB — MAGNESIUM: Magnesium: 2 mg/dL (ref 1.7–2.4)

## 2017-03-28 MED ORDER — TRACE MINERALS CR-CU-MN-SE-ZN 10-1000-500-60 MCG/ML IV SOLN
INTRAVENOUS | Status: AC
  Administered 2017-03-28: 18:00:00 via INTRAVENOUS
  Filled 2017-03-28: qty 1942

## 2017-03-28 MED ORDER — FAT EMULSION 20 % IV EMUL
240.0000 mL | INTRAVENOUS | Status: AC
  Administered 2017-03-28: 240 mL via INTRAVENOUS
  Filled 2017-03-28: qty 250

## 2017-03-28 MED ORDER — NYSTATIN 100000 UNIT/ML MT SUSP
5.0000 mL | Freq: Four times a day (QID) | OROMUCOSAL | Status: AC
  Filled 2017-03-28: qty 5

## 2017-03-28 NOTE — Progress Notes (Signed)
Minimal amount of tan colored drainage from the lower aspect of the mid abd incision at the navel site.  Applied moist to dry normal saline gauze to site and covered with 4x4.

## 2017-03-28 NOTE — Progress Notes (Addendum)
Patient ID: Kaylee Mullins, female   DOB: 10/09/1935, 81 y.o.   MRN: [id number] 18 Days Post-Op   Subjective: Much more alert this am.  Mainly c/o inability to sleep at night   Objective: Vital signs in last 24 hours: Temp:  [98.3 F (36.8 C)-99 F (37.2 C)] 98.3 F (36.8 C) (08/12 0543) Pulse Rate:  [84-94] 84 (08/12 0543) Resp:  [17-18] 18 (08/12 0543) BP: (137-156)/(73-74) 144/74 (08/12 0543) SpO2:  [99 %-100 %] 99 % (08/12 0543) Weight:  [96.9 kg (213 lb 10 oz)] 96.9 kg (213 lb 10 oz) (08/12 0543) Last BM Date: 03/27/17  Intake/Output from previous day: 08/11 0701 - 08/12 0700 In: 1071.2 [P.O.:30; I.V.:981.2; IV Piggyback:60] Out: 51 [Drains:50; Stool:1] Intake/Output this shift: No intake/output data recorded.  General appearance: alert, cooperative and no distress GI: minimally.  No significant tenderness. G-tube to drainage. No erythema or drainage Extremities: 2+ edema.   Lab Results:  No results for input(s): WBC, HGB, HCT, PLT in the last 72 hours. BMET  Recent Labs  03/27/17 0427 03/28/17 0500  NA 139 136  K 3.4* 4.3  CL 112* 105  CO2 20* 25  GLUCOSE 140* 157*  BUN 19 26*  CREATININE 0.65 0.95  CALCIUM 6.9* 8.4*     Studies/Results: No results found.  Anti-infectives: Anti-infectives    Start     Dose/Rate Route Frequency Ordered Stop   03/05/17 0645  fluconazole (DIFLUCAN) tablet 200 mg     200 mg Oral  Once 03/05/17 0636 03/05/17 0657   02/16/17 1500  ceFAZolin (ANCEF) IVPB 2g/100 mL premix     2 g 200 mL/hr over 30 Minutes Intravenous To Radiology 02/16/17 1447 02/16/17 1613   02/09/17 0600  erythromycin (EES) 400 MG/5ML suspension 200 mg  Status:  Discontinued     200 mg Oral Every 6 hours 02/09/17 0147 03/22/17 0920   02/07/17 0000  erythromycin (EES) 400 MG/5ML suspension 400 mg  Status:  Discontinued     400 mg Oral Every 6 hours 02/07/17 1932 02/09/17 0147   02/06/17 0600  erythromycin ethylsuccinate (EES) 200 MG/5ML suspension 400  mg  Status:  Discontinued     400 mg Oral Every 6 hours 02/06/17 0324 02/07/17 1932      Assessment/Plan: s/p Procedure(s): EXPLORATORY LAPAROTOMY Open jejunostomy tube Persisting gastroparesis post-Whipple. Pt did not tolerating tube feeds despite 2 weeks of attempts.     Ct was negative.  Doing better off tube feeds.    Will plan discharge to SNF on TNA for several weeks prior to restarting tube feed attempts.  Her rectus sheath hematoma may be inhibiting her ability to tolerate feeds. Continue g tube clamping for ambulating and bathroom breaks.   Doing better with sedating meds decreased.    Oral thrush: Nystatin swish and swallow  Plan d/c Monday to [facility] with TNA. Oral antihypertensives ok.     LOS: 50 days    Kaylee Kluesner C. 02/03/5092

## 2017-03-28 NOTE — Progress Notes (Signed)
PHARMACY - ADULT TOTAL PARENTERAL NUTRITION CONSULT NOTE   Pharmacy Consult:  TPN Indication: Ileus / Intolerance to EN  Patient Measurements: Height: 5\' 4"  (162.6 cm) Weight: 213 lb 10 oz (96.9 kg) IBW/kg (Calculated) : 54.7 TPN AdjBW (KG): 66.1 Body mass index is 36.67 kg/m.  Assessment:  [name] with severe malnutrition related to renal cell and pancreatic cancer who presented with abdominal mass on 01/12/17.  She is s/p Whipple procedure on the same day for pancreatic cancer.  She was discharged 02/05/17 and readmitted same day for vomiting at SNF. Pharmacy consulted to manage TPN for ileus that prevented oral intake and caused significant nausea and vomiting.   GI: s/p GJ 7/10, malpositioned, J tube placed 7/25, TF held 8/8 d/t abd distention/N/V, reinitiate at a later time.  Prealbumin decreased to 11.5.  Drain O/P 30mL, BM x3. On PPI IV, Miralax, PRN Zofran/Phenergan (QTc 421ms on 6/17). Endo: hypothyroid on Synthroid.  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  Lovenox stopped 7/17.  IVC filter placed 7/18. Hgb low but stable at 9.4, plts elevated Best Practices: SCDs  TPN Access: double lumen PICC 02/02/17 TPN start date: 01/23/17   Nutritional Goals (per RD recommendation on 8/2): 1800-1950 kCal and 82-93 g protein  Current Nutrition: TPN   Plan:   - Continue cyclic Clinimix E 0/34, infuse 1942 mls over 18 hours:  75 ml/hr x 1 hr, then 112 ml/hr x 16 hrs, then 75 ml/hr x 1 hr. - 20% ILE at 20 ml/hr x 12  hrs - TPN provides 1859 kCal and 97 gm of protein per day, meeting 100% of total needs - Daily multivitamin and trace elements in TPN - Continue moderate SSI 6x/day (0500, 0800, 1300, 1600, 2000, 2300).  Reduce frequency once cycle is established and CBGs are stable. - Continue 70 units of insulin in TPN - F/U AM labs, CBGs to reduce cyclic schedule (unable to advance beyond 16-hr cycle in the past d/t uncontrolled CBGs)    Kaylee Mullins, PharmD, BCPS Pager:  [phone] 03/28/2017, 7:14 AM

## 2017-03-29 LAB — CBC
HCT: 31.1 % — ABNORMAL LOW (ref 36.0–46.0)
Hemoglobin: 10.1 g/dL — ABNORMAL LOW (ref 12.0–15.0)
MCH: 30.3 pg (ref 26.0–34.0)
MCHC: 32.5 g/dL (ref 30.0–36.0)
MCV: 93.4 fL (ref 78.0–100.0)
Platelets: 432 10*3/uL — ABNORMAL HIGH (ref 150–400)
RBC: 3.33 MIL/uL — ABNORMAL LOW (ref 3.87–5.11)
RDW: 17.2 % — ABNORMAL HIGH (ref 11.5–15.5)
WBC: 9 10*3/uL (ref 4.0–10.5)

## 2017-03-29 LAB — COMPREHENSIVE METABOLIC PANEL
ALT: 21 U/L (ref 14–54)
AST: 26 U/L (ref 15–41)
Albumin: 2.9 g/dL — ABNORMAL LOW (ref 3.5–5.0)
Alkaline Phosphatase: 82 U/L (ref 38–126)
Anion gap: 6 (ref 5–15)
BUN: 28 mg/dL — ABNORMAL HIGH (ref 6–20)
CO2: 25 mmol/L (ref 22–32)
Calcium: 8.6 mg/dL — ABNORMAL LOW (ref 8.9–10.3)
Chloride: 105 mmol/L (ref 101–111)
Creatinine, Ser: 0.92 mg/dL (ref 0.44–1.00)
GFR calc Af Amer: >60 mL/min (ref >60)
GFR calc non Af Amer: 58 mL/min — ABNORMAL LOW (ref >60)
Glucose, Bld: 139 mg/dL — ABNORMAL HIGH (ref 65–99)
Potassium: 4.2 mmol/L (ref 3.5–5.1)
Sodium: 136 mmol/L (ref 135–145)
Total Bilirubin: 0.6 mg/dL (ref 0.3–1.2)
Total Protein: 7.4 g/dL (ref 6.5–8.1)

## 2017-03-29 LAB — DIFFERENTIAL
Basophils Absolute: 0 10*3/uL (ref 0.0–0.1)
Basophils Relative: 0 %
Eosinophils Absolute: 0.2 10*3/uL (ref 0.0–0.7)
Eosinophils Relative: 2 %
Lymphocytes Relative: 23 %
Lymphs Abs: 2.1 10*3/uL (ref 0.7–4.0)
Monocytes Absolute: 0.6 10*3/uL (ref 0.1–1.0)
Monocytes Relative: 7 %
Neutro Abs: 6.1 10*3/uL (ref 1.7–7.7)
Neutrophils Relative %: 68 %

## 2017-03-29 LAB — GLUCOSE, CAPILLARY
Glucose-Capillary: 137 mg/dL — ABNORMAL HIGH (ref 65–99)
Glucose-Capillary: 133 mg/dL — ABNORMAL HIGH (ref 65–99)
Glucose-Capillary: 143 mg/dL — ABNORMAL HIGH (ref 65–99)
Glucose-Capillary: 209 mg/dL — ABNORMAL HIGH (ref 65–99)

## 2017-03-29 LAB — PREALBUMIN: Prealbumin: 21.7 mg/dL (ref 18–38)

## 2017-03-29 LAB — TRIGLYCERIDES: Triglycerides: 94 mg/dL (ref <150)

## 2017-03-29 LAB — MAGNESIUM: Magnesium: 1.9 mg/dL (ref 1.7–2.4)

## 2017-03-29 LAB — PHOSPHORUS: Phosphorus: 4.3 mg/dL (ref 2.5–4.6)

## 2017-03-29 MED ORDER — HEPARIN SOD (PORK) LOCK FLUSH 100 UNIT/ML IV SOLN
250.0000 [IU] | INTRAVENOUS | Status: AC | PRN
  Administered 2017-03-29: 16:00:00

## 2017-03-29 MED ORDER — NYSTATIN 100000 UNIT/ML MT SUSP: 5.0000 mL | Freq: Four times a day (QID) | OROMUCOSAL | 0 refills | Status: DC

## 2017-03-29 MED ORDER — PANTOPRAZOLE SODIUM 40 MG PO TBEC: 40.0000 mg | DELAYED_RELEASE_TABLET | Freq: Every day | ORAL | 1 refills | Status: DC

## 2017-03-29 NOTE — Progress Notes (Signed)
PT Cancellation Note  Patient Details Name: Kaylee Mullins MRN: [id number] DOB: Apr 21, 1936   Cancelled Treatment:    Reason Eval/Treat Not Completed: Patient declined, no reason specified (Nurse stated she just went in to bath and usually takes a while to complete. )   Randalyn Rhea 03/29/2017, 2:41 PM

## 2017-03-29 NOTE — Progress Notes (Signed)
Physical Therapy Treatment Patient Details Name: Kaylee Mullins MRN: [id number] DOB: 01-25-1936 Today's Date: 03/29/2017    History of Present Illness Pt is a 81 y/o female who was dishcharged and readmitted following new onset nausea at SNF. Pt is s/p whipple placement from previous admission, and had GJ tube placed this admission. Pt with LLE DVT and L abdomen intramuscular hematoma; lovenox discontinued and supposed to have IV filter placed today or tomorrow. PMH includes anxiety, thyroid cancer, pancreatic mass s/p whipple, DM, HTN, CVA with L sided weakness, and s/p lumbar surgery.     PT Comments    Pt performed well during tx prior to discharging to SNF. Pt able to increase gait distance however still remains min guard due to unsteadiness. Pt tolerated exercises well with max cueing for proper technique and form. SNF remains appropriate for pt to increase safe functional mobility as well as assist in increasing overall cardiovascular endurance. Focus for next tx on increasing BLE exercise repetitions to increase strength and endurance.   Follow Up Recommendations  SNF     Equipment Recommendations  3in1 (PT)    Recommendations for Other Services       Precautions / Restrictions Precautions Precautions: Fall Precaution Comments: GJ tube  Required Braces or Orthoses: Other Brace/Splint Other Brace/Splint: abdominal binder (need to use two binders together) Restrictions Weight Bearing Restrictions: No    Mobility  Bed Mobility               General bed mobility comments: Pt in chair upon arrival.   Transfers Overall transfer level: Modified independent Equipment used: Rolling walker (2 wheeled) Transfers: Sit to/from Stand Sit to Stand: Modified independent (Device/Increase time)         General transfer comment: Min guard for safety. Demonstrated safe hand placment.   Ambulation/Gait Ambulation/Gait assistance: Min guard Ambulation Distance (Feet): 285  Feet Assistive device: Rolling walker (2 wheeled) Gait Pattern/deviations: Step-through pattern;Decreased dorsiflexion - left;Decreased dorsiflexion - right Gait velocity: Overall decreased.   General Gait Details: Pt initially increased gait velocity and required cues to decrease for safety. Contact gaurd assist for stability.    Stairs            Wheelchair Mobility    Modified Rankin (Stroke Patients Only)       Balance Overall balance assessment: Needs assistance Sitting-balance support: Feet supported;No upper extremity supported Sitting balance-Leahy Scale: Good     Standing balance support: Bilateral upper extremity supported;During functional activity Standing balance-Leahy Scale: Poor Standing balance comment: Reliant on BUE support during gait and Static standing exercises.                             Cognition Arousal/Alertness: Awake/alert Behavior During Therapy: Flat affect Overall Cognitive Status: Within Functional Limits for tasks assessed                                 General Comments: Son in room upon arrival.      Exercises General Exercises - Lower Extremity Ankle Circles/Pumps: AROM;Both;10 reps;Seated Hip ABduction/ADduction: AROM;Both;5 reps;Standing Hip Flexion/Marching: AROM;Both;15 reps;Seated Toe Raises: AROM;Both;15 reps;Standing Heel Raises: AROM;Both;15 reps;Standing Other Exercises Other Exercises: x15 Static standing BiL knee flexion     General Comments General comments (skin integrity, edema, etc.): Pt reports feeling nauseous throughout PT.      Pertinent Vitals/Pain Pain Assessment: Faces Faces Pain Scale: Hurts a  little bit Pain Location: abdomen Pain Descriptors / Indicators: Constant;Discomfort Pain Intervention(s): Limited activity within patient's tolerance;Monitored during session    Home Living                      Prior Function            PT Goals (current goals can  now be found in the care plan section) Acute Rehab PT Goals Patient Stated Goal: to get better  PT Goal Formulation: With patient Potential to Achieve Goals: Good Progress towards PT goals: Progressing toward goals    Frequency    Min 2X/week      PT Plan Current plan remains appropriate    Co-evaluation              AM-PAC PT "6 Clicks" Daily Activity  Outcome Measure  Difficulty turning over in bed (including adjusting bedclothes, sheets and blankets)?: None Difficulty moving from lying on back to sitting on the side of the bed? : A Little Difficulty sitting down on and standing up from a chair with arms (e.g., wheelchair, bedside commode, etc,.)?: A Lot Help needed moving to and from a bed to chair (including a wheelchair)?: A Little Help needed walking in hospital room?: A Little Help needed climbing 3-5 steps with a railing? : A Lot 6 Click Score: 17    End of Session   Activity Tolerance: Patient tolerated treatment well;Patient limited by fatigue Patient left: in chair;with call bell/phone within reach;with family/visitor present Nurse Communication: Mobility status PT Visit Diagnosis: Difficulty in walking, not elsewhere classified (R26.2);Muscle weakness (generalized) (M62.81);Pain Pain - part of body:  (Abdomen)     Time: 2637-8588 PT Time Calculation (min) (ACUTE ONLY): 19 min  Charges:  $Gait Training: 8-22 mins                    G CodesRandalyn Mullins, [place] 03/29/2017, 4:46 PM

## 2017-03-29 NOTE — NC FL2 (Signed)
[place] LEVEL OF CARE SCREENING TOOL     IDENTIFICATION  Patient Name: Kaylee Mullins Birthdate: 1935/12/10 Sex: female Admission Date (Current Location): 02/05/2017  [facility] and [place] Number:  Herbalist and Address:  [facility], [place] [address] [place], [place] [address]      Provider Number: [id number]  Attending Physician Name and Address:  Stark Klein, MD  Relative Name and Phone Number:       Current Level of Care: Hospital Recommended Level of Care: [place] Prior Approval Number:    Date Approved/Denied:   PASRR Number: [id number]  Discharge Plan: SNF    Current Diagnoses: Patient Active Problem List   Diagnosis Date Noted  . Malnutrition of moderate degree 02/07/2017  . Gastroparesis 02/06/2017  . Adenocarcinoma ([place]) 01/12/2017  . 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 Essential hypertension 02/11/2016  . Lumbar stenosis with neurogenic claudication 02/10/2016  . Hyperlipidemia 02/10/2016  . Hypothyroidism 02/10/2016  . Musculoskeletal neck pain 12/13/2014  . Muscular pain 12/13/2014  . Numbness   . Acute CVA (cerebrovascular accident) ([place]) 09/16/2014  . Right renal mass 09/13/2014  . Cerebral infarction due to unspecified mechanism   . CVA (cerebral infarction) 09/11/2014  . Osteoarthritis 09/11/2014  . Postsurgical hypothyroidism 09/11/2014  . Uncontrolled hypertension 09/11/2014  . History of CVA (cerebral vascular accident) ([place]) 09/11/2014  . Diabetes mellitus without complication ([place]) 81/08/7508  . Dyslipidemia 04/20/2007    Orientation RESPIRATION BLADDER Height & Weight     Time, Situation, Place, Self  Normal Continent Weight: 212 lb 8.4 oz (96.4 kg) Height:  5\' 4"  (162.6 cm)  BEHAVIORAL SYMPTOMS/MOOD NEUROLOGICAL BOWEL NUTRITION STATUS       (Gastrostomy placed on 7/03)  (Please see d/c summary)  AMBULATORY STATUS COMMUNICATION OF NEEDS Skin   Limited Assist  Verbally Surgical wounds (Closed incision mid abdomen, left abdomen.)                       Personal Care Assistance Level of Assistance  Bathing, Feeding, Dressing Bathing Assistance: Limited assistance Feeding assistance: Independent Dressing Assistance: Limited assistance     Functional Limitations Info  Sight, Hearing, Speech Sight Info: Adequate Hearing Info: Adequate Speech Info: Adequate    SPECIAL CARE FACTORS FREQUENCY  PT (By licensed PT), OT (By licensed OT)     PT Frequency: 2x/week OT Frequency: 2x/week            Contractures Contractures Info: Not present    Additional Factors Info  Code Status, Allergies Code Status Info: Full Code Allergies Info: Hydralazine Hcl, Darvon Propoxyphene Hcl, Nyquil Multi-symptom Pseudoeph-doxylamine-dm-apap, Metformin And Related           Current Medications (03/29/2017):  This is the current hospital active medication list Current Facility-Administered Medications  Medication Dose Route Frequency Provider Last Rate Last Dose  . 0.9 %  sodium chloride infusion   Intravenous Continuous Tyrone Apple, RPH 10 mL/hr at 03/28/17 1708    . acetaminophen (TYLENOL) tablet 650 mg  650 mg Oral Q6H PRN Greer Pickerel, MD   650 mg at 03/19/17 0003   Or  . acetaminophen (TYLENOL) suppository 650 mg  650 mg Rectal Q6H PRN Greer Pickerel, MD      . albuterol (PROVENTIL) (2.5 MG/3ML) 0.083% nebulizer solution 2.5 mg  2.5 mg Nebulization Q4H PRN Greer Pickerel, MD      . amLODipine (NORVASC) tablet 10 mg  10 mg Oral Daily  Stark Klein, MD   10 mg at 03/29/17 1027  . chlorhexidine (PERIDEX) 0.12 % solution 15 mL  15 mL Mouth Rinse BID Stark Klein, MD   15 mL at 03/29/17 1027  . insulin aspart (novoLOG) injection 0-15 Units  0-15 Units Subcutaneous 6 times per day Tyrone Apple, [facility]   5 Units at 03/29/17 1631  . levothyroxine (SYNTHROID, LEVOTHROID) tablet 200 mcg  200 mcg Oral QAC breakfast Stark Klein, MD   200 mcg at 03/29/17 0856  .  MEDLINE mouth rinse  15 mL Mouth Rinse q12n4p Stark Klein, MD   15 mL at 03/29/17 1632  . methocarbamol (ROBAXIN) 1,000 mg in dextrose 5 % 50 mL IVPB  1,000 mg Intravenous H84O PRN Leighton Ruff, MD 962 mL/hr at 03/27/17 1816 1,000 mg at 03/27/17 1816  . metoprolol succinate (TOPROL-XL) 24 hr tablet 100 mg  100 mg Oral Daily Greer Pickerel, MD   100 mg at 03/29/17 1027  . morphine 4 MG/ML injection 1-2 mg  1-2 mg Intravenous X5M PRN Leighton Ruff, MD      . NIFEdipine (PROCARDIA-XL/ADALAT CC) 24 hr tablet 30 mg  30 mg Oral BID PRN Greer Pickerel, MD   30 mg at 03/25/17 8413  . ondansetron (ZOFRAN-ODT) disintegrating tablet 4 mg  4 mg Oral Q6H PRN Greer Pickerel, MD   4 mg at 03/23/17 1702   Or  . ondansetron [facility]) injection 4 mg  4 mg Intravenous Q6H PRN Greer Pickerel, MD   4 mg at 03/29/17 1333  . oxyCODONE (ROXICODONE) 5 MG/5ML solution 5-10 mg  5-10 mg Oral K4M PRN Leighton Ruff, MD      . pantoprazole (PROTONIX) injection 40 mg  40 mg Intravenous Q24H Judeth Horn, MD   40 mg at 03/28/17 2222  . phenol (CHLORASEPTIC) mouth spray 1 spray  1 spray Mouth/Throat PRN Judeth Horn, MD      . polyethylene glycol (MIRALAX / GLYCOLAX) packet 17 g  17 g Oral Daily Stark Klein, MD   17 g at 03/27/17 1357  . promethazine (PHENERGAN) injection 12.5 mg  12.5 mg Intravenous W1U PRN Leighton Ruff, MD      . sodium chloride flush (NS) 0.9 % injection 10-40 mL  10-40 mL Intracatheter PRN Stark Klein, MD   10 mL at 03/27/17 1428     Discharge Medications: Please see discharge summary for a list of discharge medications.  Relevant Imaging Results:  Relevant Lab Results:   Additional Information SSN: [id number]  Truitt Merle, LCSW

## 2017-03-29 NOTE — Progress Notes (Signed)
Patient discharged to [facility] via [facility].  

## 2017-03-29 NOTE — Progress Notes (Signed)
PHARMACY - ADULT TOTAL PARENTERAL NUTRITION CONSULT NOTE   Pharmacy Consult:  TPN Indication: Ileus / Intolerance to EN  Patient Measurements: Height: 5\' 4"  (162.6 cm) Weight: 212 lb 8.4 oz (96.4 kg) IBW/kg (Calculated) : 54.7 TPN AdjBW (KG): 66.1 Body mass index is 36.48 kg/m.  Assessment:  59 YOF with severe malnutrition related to renal cell and pancreatic cancer who presented with abdominal mass on 01/12/17.  She is s/p Whipple procedure on the same day for pancreatic cancer.  She was discharged 02/05/17 and readmitted same day for vomiting at SNF. Pharmacy consulted to manage TPN for ileus that prevented oral intake and caused significant nausea and vomiting.   GI: s/p GJ 7/10, malpositioned, J tube placed 7/25, TF held 8/8 d/t abd distention and N/V, reinitiate at a later time. Plan per surgery is to discharge to SNF on TPN and to retry TFs in a couple weeks. Prealbumin increased to 21.7. Albumin up to 2.9. Drain O/P 80mL, BM x3. On PPI IV, Miralax, PRN Zofran/Phenergan (QTc 420ms on 6/17). Endo: hypothyroid on Synthroid.  DM not on med PTA - CBGs acceptable on and off TPN Insulin requirements in the past 12 hours: 10 units of SSI + 70 units in TPN Lytes: wnl today Renal: SCr 0.65 stable, BUN WNL. UOP good. NS at 10 ml/hr Pulm: stable on RA  Cards: HTN - BP improving, HR WNL - Norvasc, Toprol, PRN nifedipine XL  Hepatobil: LFTs / tbili remain wnl. 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 No abx AC/Heme: LLE DVT but has large rectus hematoma.  Lovenox stopped 7/17.  IVC filter placed 7/18. Hgb low but stable at 9.4, plts elevated Best Practices: SCDs  TPN Access: double lumen PICC 02/02/17 TPN start date: 01/23/17   Nutritional Goals (per RD recommendation on 8/7): 1800-1950 kCal and 82-93 g protein  Current Nutrition: TPN + IV lipid emulsions  Plan:   Suppose to discharge to [facility] today Will hold  off on [place], PharmD, [facility] Clinical Pharmacist Pager [phone] 03/29/2017 7:16 AM

## 2017-03-29 NOTE — Progress Notes (Signed)
Patient for discharge to [facility], awaiting transporter, report was given to nurse Renaldo Fiddler.

## 2017-03-29 NOTE — Clinical Social Work Note (Addendum)
Pt is ready for discharge today and will return to [facility]. CSW sent updated clinicals to [facility] and communicated with Nicki (admissions) to confirm ability to admit pt today. Room and report provided to RN Remo Lipps and put in treatment team sticky note. Nicki confirmed TNA will be delivered at 4pm. RN to update pharmacy not to make TNA for this evening. Transportation arranged with [facility]. CSW is signing off as no further needs identified.  [name], [place], [place] Work [phone]

## 2017-03-30 ENCOUNTER — Non-Acute Institutional Stay (SKILLED_NURSING_FACILITY): Payer: Medicare Other | Admitting: Internal Medicine

## 2017-03-30 DIAGNOSIS — I82442 Acute embolism and thrombosis of left tibial vein: Secondary | ICD-10-CM | POA: Diagnosis not present

## 2017-03-30 DIAGNOSIS — E119 Type 2 diabetes mellitus without complications: Secondary | ICD-10-CM | POA: Diagnosis not present

## 2017-03-30 DIAGNOSIS — K3184 Gastroparesis: Secondary | ICD-10-CM | POA: Diagnosis not present

## 2017-03-30 DIAGNOSIS — I10 Essential (primary) hypertension: Secondary | ICD-10-CM

## 2017-03-30 DIAGNOSIS — C241 Malignant neoplasm of ampulla of Vater: Secondary | ICD-10-CM | POA: Diagnosis not present

## 2017-03-30 DIAGNOSIS — Z789 Other specified health status: Secondary | ICD-10-CM | POA: Diagnosis not present

## 2017-03-30 DIAGNOSIS — S301XXD Contusion of abdominal wall, subsequent encounter: Secondary | ICD-10-CM | POA: Diagnosis not present

## 2017-03-30 DIAGNOSIS — K219 Gastro-esophageal reflux disease without esophagitis: Secondary | ICD-10-CM | POA: Diagnosis not present

## 2017-03-30 DIAGNOSIS — C801 Malignant (primary) neoplasm, unspecified: Secondary | ICD-10-CM | POA: Diagnosis not present

## 2017-03-30 NOTE — Progress Notes (Signed)
: Provider:   Location:  [facility] Room Number: [place]:  SNF [phone])  Provider: Noah Delaine. Sheppard Coil, MD  PCP: Nolene Ebbs, MD Patient Care Team: Nolene Ebbs, MD as PCP - General  Extended Emergency Contact Information Primary Emergency Contact: Soto,Sheila Address: [address]          [place], [place] [address] Johnnette Litter of [place] Phone: [phone] Work Phone: [phone] Mobile Phone: [phone] Relation: Daughter Secondary Emergency Contact: Jerilee Field Address: [address]          [place], [place] [address] Johnnette Litter of [place] Phone: [phone] Mobile Phone: [phone] Relation: Son     Allergies: Hydralazine hcl; Darvon [propoxyphene hcl]; Nyquil multi-symptom [pseudoeph-doxylamine-dm-apap]; and Metformin and related  Chief Complaint  Patient presents with  . New Admit To SNF    following hospitalization 02/05/17 to 03/29/17 malnutrition of moderate degree    HPI: Patient is 81 y.o. 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 She also during this time developed a left popliteal DVT and acute kidney injury. Patient was maintained on TPN. Patient was readmitted back to [facility] from 6/22-8/13 for continued management of her gastroparesis. AG-tube was placed by IR to help alleviate patient's vomiting. She had no fever or chills. She is maintained on therapeutic Lovenox for DVT. T NA was cycled. Interventional radiology then converted her G-tube to a GJ tube, but unfortunately the J-tube port migrated up to her stomach rapidly. It was repositioned and then relocated again. She was taken to surgery for J-tube on 03/10/17. 2 weeks of attempts patient was unable to get about 20 mils per hour on Tuesdays without significant GI distress. She was  doing better with brown stools and passing gas but unable to progress and her feedings. She developed a rectus sheath hematoma and oriented coagulation was stopped and IVC filter was placed this is now remains stable. She was doing much better off in full nutrition. Plan then is to give her several weeks tube feeding before restarting G-tube feedings in the meantime her G-tube will be draining to gravity. Patient is admitted to skilled nursing facility for continued TPN and for OT/PT. While at skilled nursing facility patient will be followed for hypertension treated with Norvasc, Lasix, Avapro, metoprolol, nifedipine and Micardis HCT, DVT treated treated with Lovenox and diabetes mellitus 2 treated with NovoLog.  Past Medical History:  Diagnosis Date  . Acute CVA (cerebrovascular accident) ([place]) 09/16/2014  . Adenocarcinoma ([place]) 01/12/2017  . 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 Anxiety   . Arthritis    knees  . Black tarry stools    05-14-16 negative for occult blood with ER visit- noted in [place].  . Cancer [facility])    thyroid cancer- surgery and radiation  . Cerebral infarction due to unspecified mechanism   . Chronic kidney disease    questionable mass on kidney. Being followed by Dr Diona Fanti  . Complication of anesthesia    heart rate was really low  . CVA (cerebral infarction) 09/11/2014  . Depression   . Diabetes mellitus    type 2  . Diabetes mellitus without complication ([place]) 03/17/8298   Qualifier: Diagnosis of  By: Loanne Drilling MD, Jacelyn Pi   . Dyslipidemia 04/20/2007   Qualifier: Diagnosis of  By: Loanne Drilling MD, Jacelyn Pi   . Full  dentures   . Gastroparesis 02/06/2017  . History of CVA (cerebral vascular accident) ([place]) 09/11/2014  . Hypertension   . Hypothyroidism   . Lumbar stenosis with neurogenic claudication 02/10/2016  . Osteoarthritis 09/11/2014  . Pneumonia   . Right renal mass 09/13/2014  . Spinal stenosis   . Stroke [facility]) 09/2014   left sided weakness    Past Surgical History:    Procedure Laterality Date  . ABDOMINAL HYSTERECTOMY     partial  . cataracts     Removed  11/2015  bilateral  . COLONOSCOPY W/ POLYPECTOMY    . ERCP N/A 05/07/2016   Procedure: ENDOSCOPIC RETROGRADE CHOLANGIOPANCREATOGRAPHY (ERCP);  Surgeon: Clarene Essex, MD;  Location: [facility] ENDOSCOPY;  Service: Endoscopy;  Laterality: N/A;  . ERCP N/A 10/16/2016   Procedure: ENDOSCOPIC RETROGRADE CHOLANGIOPANCREATOGRAPHY (ERCP);  Surgeon: Clarene Essex, MD;  Location: [facility] ENDOSCOPY;  Service: Endoscopy;  Laterality: N/A;  . ESOPHAGOGASTRODUODENOSCOPY N/A 02/10/2017   Procedure: ESOPHAGOGASTRODUODENOSCOPY (EGD);  Surgeon: Teena Irani, MD;  Location[facility] ENDOSCOPY;  Service: Endoscopy;  Laterality: N/A;  . EUS N/A 05/27/2016   Procedure: ESOPHAGEAL ENDOSCOPIC ULTRASOUND (EUS) RADIAL;  Surgeon: Arta Silence, MD;  Location: [facility] ENDOSCOPY;  Service: Endoscopy;  Laterality: N/A;  . FLEXIBLE SIGMOIDOSCOPY  03/29/2012   Procedure: FLEXIBLE SIGMOIDOSCOPY;  Surgeon: Jeryl Columbia, MD;  Location: [facility] ENDOSCOPY;  Service: Endoscopy;  Laterality: N/A;  fleet enema upon arrival  . HOT HEMOSTASIS  03/29/2012   Procedure: HOT HEMOSTASIS (ARGON PLASMA COAGULATION/BICAP);  Surgeon: Jeryl Columbia, MD;  Location: [facility] ENDOSCOPY;  Service: Endoscopy;  Laterality: N/A;  . IR GASTR TUBE CONVERT GASTR-JEJ PER W/FL MOD SED  02/23/2017  . IR GASTROSTOMY TUBE MOD SED  02/16/2017  . IR GENERIC HISTORICAL  06/30/2016   IR RADIOLOGIST EVAL & MGMT 06/30/2016 Aletta Edouard, MD [facility] INTERV RAD  . IR GENERIC HISTORICAL  09/09/2016   IR RADIOLOGIST EVAL & MGMT 09/09/2016 Aletta Edouard, MD [facility] INTERV RAD  . IR IVC FILTER PLMT / S&I /IMG GUID/MOD SED  03/03/2017  . LAPAROSCOPY N/A 01/12/2017   Procedure: LAPAROSCOPY DIAGNOSTIC;  Surgeon: Stark Klein, MD;  Location: [facility];  Service: General;  Laterality: N/A;  . LAPAROTOMY N/A 03/10/2017   Procedure: EXPLORATORY LAPAROTOMY Open jejunostomy tube;  Surgeon: Stark Klein, MD;  Location: [facility];  Service:  General;  Laterality: N/A;  . LUMBAR LAMINECTOMY/DECOMPRESSION MICRODISCECTOMY N/A 02/10/2016   Procedure: Lumbar three- four Laminectomy;  Surgeon: Kristeen Miss, MD;  Location: [facility] NEURO ORS;  Service: Neurosurgery;  Laterality: N/A;  L3-4 Laminectomy  . LUMBAR SPINE SURGERY     1st surgery "ray cage placed"  . THYROIDECTOMY  2005  . TONSILLECTOMY    . WHIPPLE PROCEDURE N/A 01/12/2017   Procedure: WHIPPLE PROCEDURE;  Surgeon: Stark Klein, MD;  Location: [facility] [facility];  Service: General;  Laterality: N/A;    Allergies as of 03/30/2017      Reactions   Hydralazine Hcl Shortness Of Breath   Pt had severe respiratory distress after receiving a dose   Darvon [propoxyphene Hcl] Other (See Comments)   Hallucinations   Nyquil Multi-symptom [pseudoeph-doxylamine-dm-apap] Other (See Comments)   Makes pt not "feel right in her head"   Metformin And Related Diarrhea      Medication List       Accurate as of 03/30/17 11:31 AM. Always use your most recent med list.          acetaminophen 500 MG tablet Commonly known as:  TYLENOL Take 500-1,000 mg by mouth every  6 (six) hours as needed for mild pain or moderate pain (depends on pain if takes 1-2 tablets).   albuterol (2.5 MG/3ML) 0.083% nebulizer solution Commonly known as:  PROVENTIL Take 3 mLs (2.5 mg total) by nebulization every 4 (four) hours as needed for wheezing or shortness of breath.   amLODipine 10 MG tablet Commonly known as:  NORVASC Take 1 tablet (10 mg total) by mouth daily.   aspirin 81 MG chewable tablet Commonly known as:  ASPIRIN CHILDRENS Chew 1 tablet (81 mg total) by mouth daily.   enoxaparin 100 MG/ML injection Commonly known as:  LOVENOX Inject 0.9 mLs (90 mg total) into the skin every 12 (twelve) hours.   furosemide 40 MG tablet Commonly known as:  LASIX Take 40 mg by mouth daily as needed (leg swelling).   insulin aspart 100 UNIT/ML injection Commonly known as:  novoLOG Inject 0-15 Units into the skin every 4  (four) hours.   irbesartan 300 MG tablet Commonly known as:  AVAPRO Take 1 tablet (300 mg total) by mouth daily.   levothyroxine 200 MCG tablet Commonly known as:  SYNTHROID, LEVOTHROID Take 200 mcg by mouth daily before breakfast. For hypothyroidism   metoprolol succinate 100 MG 24 hr tablet Commonly known as:  TOPROL-XL Take 100 mg by mouth daily. Take with or immediately following a meal.   NIFEdipine 30 MG 24 hr tablet Commonly known as:  PROCARDIA-XL/ADALAT CC Take 1 tablet (30 mg total) by mouth 2 (two) times daily as needed (systolic BP over 631 mmHg).   nystatin 100000 UNIT/ML suspension Commonly known as:  MYCOSTATIN Take 5 mLs by mouth. Take 4 times daily as needed for thrush   ondansetron 4 MG disintegrating tablet Commonly known as:  ZOFRAN-ODT Take 1 tablet (4 mg total) by mouth every 6 (six) hours as needed for nausea.   oxyCODONE-acetaminophen 5-325 MG tablet Commonly known as:  PERCOCET/ROXICET Take 0.5-1 tablets by mouth every 4 (four) hours as needed for moderate pain. Depends on pain if takes 0.5-1 tablet   pantoprazole 40 MG tablet Commonly known as:  PROTONIX Take 1 tablet (40 mg total) by mouth at bedtime.   polyethylene glycol packet Commonly known as:  MIRALAX / GLYCOLAX Take 17 g by mouth daily.   sodium chloride flush 0.9 % Soln Commonly known as:  NS 10-40 mLs by Intracatheter route as needed (flush).   telmisartan-hydrochlorothiazide 80-25 MG tablet Commonly known as:  MICARDIS HCT Take 1 tablet by mouth daily.       Meds ordered this encounter  Medications  . nystatin (MYCOSTATIN) 100000 UNIT/ML suspension    Sig: Take 5 mLs by mouth. Take 4 times daily as needed for thrush    Immunization History  Administered Date(s) Administered  . PPD Test 02/14/2016    Social History  Substance Use Topics  . Smoking status: Never Smoker  . Smokeless tobacco: Never Used  . Alcohol use No    Family history is   Family History    Problem Relation Age of Onset  . Heart failure Mother   . Heart attack Father       Review of Systems  DATA OBTAINED: from patient GENERAL:  no fevers, fatigue, appetite changes SKIN: No itching, or rash EYES: No eye pain, redness, discharge EARS: No earache, tinnitus, change in hearing NOSE: No congestion, drainage or bleeding  MOUTH/THROAT: No mouth or tooth pain, No sore throat RESPIRATORY: No cough, wheezing, SOB CARDIAC: No chest pain, palpitations, lower extremity edema  GI:  No abdominal pain, No N/V/D or constipation, No heartburn or reflux  GU: No dysuria, frequency or urgency, or incontinence  MUSCULOSKELETAL: No unrelieved bone/joint pain NEUROLOGIC: No headache, dizziness or focal weakness PSYCHIATRIC: No c/o anxiety or sadness   Vitals:   03/30/17 1113  BP: (!) 154/84  Pulse: 84  Resp: 18  Temp: 97.9 F (36.6 C)  SpO2: 97%    SpO2 Readings from Last 1 Encounters:  03/30/17 97%   Body mass index is 38.28 kg/m.     Physical Exam  GENERAL APPEARANCE: Alert, conversant,  No acute distress.  SKIN: No diaphoresis rash HEAD: Normocephalic, atraumatic  EYES: Conjunctiva/lids clear. Pupils round, reactive. EOMs intact.  EARS: External exam WNL, canals clear. Hearing grossly normal.  NOSE: No deformity or discharge.  MOUTH/THROAT: Lips w/o lesions  RESPIRATORY: Breathing is even, unlabored. Lung sounds are clear   CARDIOVASCULAR: Heart RRR no murmurs, rubs or gallops. No peripheral edema.   GASTROINTESTINAL: Abdomen is soft, non-tender, Mild distended w/ decreased bowel sounds.; G/J-tube draining bile to gravity and a J-tube clamped off GENITOURINARY: Bladder non tender, not distended  MUSCULOSKELETAL: No abnormal joints or musculature NEUROLOGIC:  Cranial nerves 2-12 grossly intact. Moves all extremities  PSYCHIATRIC: Mood and affect appropriate to situation, no behavioral issues  Patient Active Problem List   Diagnosis Date Noted  . Malnutrition of  moderate degree 02/07/2017  . Gastroparesis 02/06/2017  . Adenocarcinoma ([place]) 01/12/2017  . 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 Essential hypertension 02/11/2016  . Lumbar stenosis with neurogenic claudication 02/10/2016  . Hyperlipidemia 02/10/2016  . Hypothyroidism 02/10/2016  . Musculoskeletal neck pain 12/13/2014  . Muscular pain 12/13/2014  . Numbness   . Acute CVA (cerebrovascular accident) ([place]) 09/16/2014  . Right renal mass 09/13/2014  . Cerebral infarction due to unspecified mechanism   . CVA (cerebral infarction) 09/11/2014  . Osteoarthritis 09/11/2014  . Postsurgical hypothyroidism 09/11/2014  . Uncontrolled hypertension 09/11/2014  . History of CVA (cerebral vascular accident) ([place]) 09/11/2014  . Diabetes mellitus without complication ([place]) 81/08/7508  . Dyslipidemia 04/20/2007      Labs reviewed: Basic Metabolic Panel:    Component Value Date/Time   NA 136 03/29/2017 0508   NA 141 11/13/2016 1329   K 4.2 03/29/2017 0508   K 3.5 11/13/2016 1329   CL 105 03/29/2017 0508   CO2 25 03/29/2017 0508   CO2 24 11/13/2016 1329   GLUCOSE 139 (H) 03/29/2017 0508   GLUCOSE 78 11/13/2016 1329   BUN 28 (H) 03/29/2017 0508   BUN 18.7 11/13/2016 1329   CREATININE 0.92 03/29/2017 0508   CREATININE 1.0 11/13/2016 1329   CALCIUM 8.6 (L) 03/29/2017 0508   CALCIUM 9.3 11/13/2016 1329   PROT 7.4 03/29/2017 0508   PROT 8.0 11/13/2016 1329   ALBUMIN 2.9 (L) 03/29/2017 0508   ALBUMIN 3.5 11/13/2016 1329   AST 26 03/29/2017 0508   AST 24 11/13/2016 1329   ALT 21 03/29/2017 0508   ALT 19 11/13/2016 1329   ALKPHOS 82 03/29/2017 0508   ALKPHOS 152 (H) 11/13/2016 1329   BILITOT 0.6 03/29/2017 0508   BILITOT 1.44 (H) 11/13/2016 1329   GFRNONAA 58 (L) 03/29/2017 0508   GFRAA >60 03/29/2017 0508     Recent Labs  03/22/17 0543 03/25/17 0437  03/27/17 0427 03/28/17 0500 03/29/17 0508  NA 137 138  < > 139 136 136  K 3.9 3.1*  < > 3.4* 4.3 4.2  CL 105 102  < > 112*  105 105  CO2  26 29  < > 20* 25 25  GLUCOSE 217* 151*  < > 140* 157* 139*  BUN 8 10  < > 19 26* 28*  CREATININE 0.71 0.69  < > 0.65 0.95 0.92  CALCIUM 8.0* 8.1*  < > 6.9* 8.4* 8.6*  MG 1.8 2.1  --   --  2.0 1.9  PHOS 2.8 3.5  --   --   --  4.3  < > = values in this interval not displayed. Liver Function Tests:  Recent Labs  03/22/17 0543 03/25/17 0437 03/29/17 0508  AST 21 38 26  ALT 18 31 21   ALKPHOS 92 99 82  BILITOT 0.7 0.6 0.6  PROT 7.1 7.6 7.4  ALBUMIN 2.6* 2.8* 2.9*    Recent Labs  05/14/16 1258 02/05/17 2039  LIPASE 18 16   No results for input(s): AMMONIA in the last 8760 hours. CBC:  Recent Labs  03/15/17 0336 03/22/17 0543 03/29/17 0508  WBC 10.2 7.5 9.0  NEUTROABS 7.9* 4.8 6.1  HGB 8.2* 9.4* 10.1*  HCT 26.1* 29.4* 31.1*  MCV 92.9 94.2 93.4  PLT 436* 427* 432*   Lipid  Recent Labs  03/15/17 0336 03/22/17 0552 03/29/17 0508  TRIG 95 93 94    Cardiac Enzymes: No results for input(s): CKTOTAL, CKMB, CKMBINDEX, TROPONINI in the last 8760 hours. BNP: No results for input(s): BNP in the last 8760 hours. No results found for: [facility] Lab Results  Component Value Date   HGBA1C 9.9 (H) 01/04/2017   Lab Results  Component Value Date   TSH 0.966 02/12/2016   No results found for: VITAMINB12 No results found for: FOLATE No results found for: IRON, TIBC, FERRITIN  Imaging and Procedures obtained prior to SNF admission: Ct Abdomen Pelvis Wo Contrast  Result Date: 02/06/2017 CLINICAL DATA:  Abdominal pain. Status post Whipple Jan 12, 2017. History of gastroparesis. EXAM: CT ABDOMEN AND PELVIS WITHOUT CONTRAST TECHNIQUE: Multidetector CT imaging of the abdomen and pelvis was performed following the standard protocol without IV contrast. Oral contrast administered. COMPARISON:  Upper GI series February 03, 2017 and CT abdomen and pelvis October 23, 2016. FINDINGS: LOWER CHEST: LEFT lower lobe atelectasis/scar. Mild cardiomegaly. Severe coronary artery  calcifications versus stent. No pericardial effusion. HEPATOBILIARY: Status post cholecystectomy. Normal noncontrast CT liver, no pneumobilia on today's examination. PANCREAS: Status post Whipple surgery, with pancreatic stent. No focal fluid collection. SPLEEN: Normal. ADRENALS/URINARY TRACT: Kidneys are orthotopic, demonstrating normal size and morphology. No nephrolithiasis, hydronephrosis; limited assessment for renal masses on this nonenhanced examination. 2 cm RIGHT upper pole cyst. The unopacified ureters are normal in course and caliber. Urinary bladder is partially distended and unremarkable. Normal adrenal glands. STOMACH/BOWEL: Retained barium in the gastric cardia results in streak artifact. Mild contrast distended stomach. Asymmetric gastric wall thickening at the level of surgical anastomosis. No contrast extravasation. Small amount of enteric contrast within the jejunum. Retained contrast in the colon which is normal in course and caliber. Mild sigmoid diverticulosis. Distal duodenum is not identified, possibly decompressed. VASCULAR/LYMPHATIC: Aortoiliac vessels are normal in course and caliber. No lymphadenopathy by CT size criteria. REPRODUCTIVE: Status post hysterectomy . OTHER: No intraperitoneal free fluid or free air. MUSCULOSKELETAL: Non-acute. Small fat containing RIGHT inguinal hernia. Status post L4-5 interbody fusion and laminectomy. Severe L3-4 degenerative disc and grade 1 L3-4 anterolisthesis with severe facet arthropathy consistent with adjacent segment disease. Severe RIGHT L3-4 neural foraminal narrowing. IMPRESSION: Status post Whipple procedure. Nonspecific eccentric gastric wall thickening at anastomosis,, likely inflammatory given recent  surgery . Recommend close attention on follow-up imaging versus endoscopy. Gastric distention retained barium in stomach from 3 days prior consistent with gastroparesis. Pancreatic stent.  No biliary dilatation by noncontrast CT. Electronically  Signed   By: Elon Alas M.D.   On: 02/06/2017 00:58     Not all labs, radiology exams or other studies done during hospitalization come through on my EPIC note; however they are reviewed by me.    Assessment and Plan  GASTROPARESIS STATUS post WHIPPLE FOR AMPULLARY CANCER/TPN-patient underwent Whipple May 29 and has had gastroparesis and inability to take enteral feedings since that time; survey of nose reveals the patient was considered cured of her cancer 1 she had her surgery; patient has been maintained on TPN most of this time since surgery; both G-tube, G/J-tube and J-tube feedings were attempted without success; patient is admitted to skilled nursing for continued TPN along with strengthening and improved nutrition prior to trying enteral nutrition again SNF - admitted for maintenance of TPN, and for generalized weakness for OT/PT  ACUTE DVT OF POSTERIOR TIBIAL AND PERONEAL VEIN/ABDOMINAL SHEATH HEMATOMA- patient was anticoagulated on Lovenox female with a rectus sheath hematoma occurred an IVC filter was placed and affect anticoagulation was stopped SNF - will patient's discharge medicines was Lovenox however I think the intent was for the IVC filter to be protective and for the rectus sheath hematoma to resolve therefore have DC'd the Lovenox  DM 2 SNF - being treated with NovoLog in the TPN and also with a every 4 sliding scale insulin; we will monitor  HYPERTENSION SNF -BP is a little high this reading, another reading was 132/93 so for the moment we'll continue the multiple blood pressure medications patient is on; including Norvasc 10 mg by mouth daily, Avapro 300 mg by mouth daily  metoprolol 24 hour 100 mg dailyand Micardis HCT 80-25 one by mouth daily with when necessary nifedipine and when necessary Lasix   GERD SNF - continue Protonix 40 mg by mouth twice a day    Time spent greater than 45 minutes;> 50% of time with patient was spent reviewing records, labs, tests  and studies, counseling and developing plan of care  Webb Silversmith D. Sheppard Coil, MD

## 2017-04-03 ENCOUNTER — Encounter: Payer: Self-pay | Admitting: Internal Medicine

## 2017-04-03 DIAGNOSIS — K219 Gastro-esophageal reflux disease without esophagitis: Secondary | ICD-10-CM

## 2017-04-03 DIAGNOSIS — S301XXA Contusion of abdominal wall, initial encounter: Secondary | ICD-10-CM | POA: Insufficient documentation

## 2017-04-03 DIAGNOSIS — I82442 Acute embolism and thrombosis of left tibial vein: Secondary | ICD-10-CM | POA: Insufficient documentation

## 2017-04-03 HISTORY — DX: Gastro-esophageal reflux disease without esophagitis: K21.9

## 2017-04-03 HISTORY — DX: Acute embolism and thrombosis of left tibial vein: I82.442

## 2017-04-07 ENCOUNTER — Non-Acute Institutional Stay (SKILLED_NURSING_FACILITY): Payer: Medicare Other | Admitting: Internal Medicine

## 2017-04-07 ENCOUNTER — Encounter: Payer: Self-pay | Admitting: Internal Medicine

## 2017-04-07 DIAGNOSIS — K3184 Gastroparesis: Secondary | ICD-10-CM | POA: Diagnosis not present

## 2017-04-07 DIAGNOSIS — Z789 Other specified health status: Secondary | ICD-10-CM

## 2017-04-07 DIAGNOSIS — R1114 Bilious vomiting: Secondary | ICD-10-CM

## 2017-04-07 DIAGNOSIS — K219 Gastro-esophageal reflux disease without esophagitis: Secondary | ICD-10-CM | POA: Diagnosis not present

## 2017-04-07 NOTE — Progress Notes (Signed)
Location:  [place] Room Number: [place]:  SNF [phone])  Provider: Noah Delaine. Sheppard Coil, MD  Nolene Ebbs, MD  Patient Care Team: Nolene Ebbs, MD as PCP - General  Extended Emergency Contact Information Primary Emergency Contact: Soto,Sheila Address: [address]          [place], [place] [address] Johnnette Litter of [place] Phone: [phone] Work Phone: [phone] Mobile Phone: [phone] Relation: Daughter Secondary Emergency Contact: Jerilee Field Address: [address]          [place], [place] [address] Johnnette Litter of [place] Phone: [phone] Mobile Phone: [phone] Relation: Son    Allergies: Hydralazine hcl; Darvon [propoxyphene hcl]; Nyquil multi-symptom [pseudoeph-doxylamine-dm-apap]; and Metformin and related  Chief Complaint  Patient presents with  . Acute Visit    vomiting    HPI: Patient is 81 y.o. 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 Patient admits that when she is taking sips of water and juice that makes her throw up; if she's been on for a while and gets really tired that makes her nauseated and throw up; patient admits this is no different than the amount that she was vomiting when she was in the hospital; patient has her G-tube for drain is draining well and 400 mL of bile is in. Patient also admits that she has some reflux of bile into the back of her throat. Patient did have 2 formed bowel movements yesterday after she drank and was able to keep down a half of MiraLAX 17 g.  Past Medical History:  Diagnosis Date  . Acute CVA (cerebrovascular accident) ([place]) 09/16/2014  . Acute DVT of left tibial vein ([place]) 04/03/2017  . Adenocarcinoma ([place]) 01/12/2017  .  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 Anxiety   . Arthritis    knees  . Black tarry stools    05-14-16 negative for occult blood with ER visit- noted in [place].  . Cancer [facility])    thyroid cancer- surgery and radiation  . Cerebral infarction due to unspecified mechanism   . Chronic kidney disease    questionable mass on kidney. Being followed by Dr Diona Fanti  . Complication of anesthesia    heart rate was really low  . CVA (cerebral infarction) 09/11/2014  . Depression   . Diabetes mellitus    type 2  . Diabetes mellitus without complication ([place]) 12/22/3218   Qualifier: Diagnosis of  By: Loanne Drilling MD, Jacelyn Pi   . Dyslipidemia 04/20/2007   Qualifier: Diagnosis of  By: Loanne Drilling MD, Jacelyn Pi   . Full dentures   . Gastroparesis 02/06/2017  . GERD (gastroesophageal reflux disease) 04/03/2017  . History of CVA (cerebral vascular accident) ([place]) 09/11/2014  . Hypertension   . Hypothyroidism   . Lumbar stenosis with neurogenic claudication 02/10/2016  . Osteoarthritis 09/11/2014  . Pneumonia   . Right renal mass 09/13/2014  . Spinal stenosis   . Stroke [facility]) 09/2014   left sided weakness    Past Surgical History:  Procedure Laterality Date  . ABDOMINAL HYSTERECTOMY     partial  . cataracts     Removed  11/2015  bilateral  . COLONOSCOPY W/ POLYPECTOMY    . ERCP N/A 05/07/2016   Procedure: ENDOSCOPIC RETROGRADE CHOLANGIOPANCREATOGRAPHY (ERCP);  Surgeon: Clarene Essex, MD;  Location: [facility] ENDOSCOPY;  Service: Endoscopy;  Laterality: N/A;  . ERCP N/A 10/16/2016   Procedure: ENDOSCOPIC RETROGRADE CHOLANGIOPANCREATOGRAPHY (ERCP);  Surgeon: Clarene Essex, MD;  Location: [facility] ENDOSCOPY;  Service: Endoscopy;  Laterality: N/A;  . ESOPHAGOGASTRODUODENOSCOPY N/A 02/10/2017   Procedure: ESOPHAGOGASTRODUODENOSCOPY (EGD);  Surgeon: Teena Irani, MD;  Location: [facility] ENDOSCOPY;  Service: Endoscopy;  Laterality: N/A;  . EUS N/A 05/27/2016   Procedure: ESOPHAGEAL ENDOSCOPIC ULTRASOUND (EUS) RADIAL;  Surgeon: Arta Silence, MD;   Location: [facility] ENDOSCOPY;  Service: Endoscopy;  Laterality: N/A;  . FLEXIBLE SIGMOIDOSCOPY  03/29/2012   Procedure: FLEXIBLE SIGMOIDOSCOPY;  Surgeon: Jeryl Columbia, MD;  Location: [facility] ENDOSCOPY;  Service: Endoscopy;  Laterality: N/A;  fleet enema upon arrival  . HOT HEMOSTASIS  03/29/2012   Procedure: HOT HEMOSTASIS (ARGON PLASMA COAGULATION/BICAP);  Surgeon: Jeryl Columbia, MD;  Location: [facility] ENDOSCOPY;  Service: Endoscopy;  Laterality: N/A;  . IR GASTR TUBE CONVERT GASTR-JEJ PER W/FL MOD SED  02/23/2017  . IR GASTROSTOMY TUBE MOD SED  02/16/2017  . IR GENERIC HISTORICAL  06/30/2016   IR RADIOLOGIST EVAL & MGMT 06/30/2016 Aletta Edouard, MD [facility] INTERV RAD  . IR GENERIC HISTORICAL  09/09/2016   IR RADIOLOGIST EVAL & MGMT 09/09/2016 Aletta Edouard, MD [facility] INTERV RAD  . IR IVC FILTER PLMT / S&I /IMG GUID/MOD SED  03/03/2017  . LAPAROSCOPY N/A 01/12/2017   Procedure: LAPAROSCOPY DIAGNOSTIC;  Surgeon: Stark Klein, MD;  Location: [facility];  Service: General;  Laterality: N/A;  . LAPAROTOMY N/A 03/10/2017   Procedure: EXPLORATORY LAPAROTOMY Open jejunostomy tube;  Surgeon: Stark Klein, MD;  Location: [facility];  Service: General;  Laterality: N/A;  . LUMBAR LAMINECTOMY/DECOMPRESSION MICRODISCECTOMY N/A 02/10/2016   Procedure: Lumbar three- four Laminectomy;  Surgeon: Kristeen Miss, MD;  Location: [facility] NEURO ORS;  Service: Neurosurgery;  Laterality: N/A;  L3-4 Laminectomy  . LUMBAR SPINE SURGERY     1st surgery "ray cage placed"  . THYROIDECTOMY  2005  . TONSILLECTOMY    . WHIPPLE PROCEDURE N/A 01/12/2017   Procedure: WHIPPLE PROCEDURE;  Surgeon: Stark Klein, MD;  Location: [facility];  Service: General;  Laterality: N/A;    Allergies as of 04/07/2017      Reactions   Hydralazine Hcl Shortness Of Breath   Pt had severe respiratory distress after receiving a dose   Darvon [propoxyphene Hcl] Other (See Comments)   Hallucinations   Nyquil Multi-symptom [pseudoeph-doxylamine-dm-apap] Other (See Comments)   Makes pt  not "feel right in her head"   Metformin And Related Diarrhea      Medication List       Accurate as of 04/07/17  4:15 PM. Always use your most recent med list.          albuterol (2.5 MG/3ML) 0.083% nebulizer solution Commonly known as:  PROVENTIL Take 3 mLs (2.5 mg total) by nebulization every 4 (four) hours as needed for wheezing or shortness of breath.   amLODipine 10 MG tablet Commonly known as:  NORVASC Take 1 tablet (10 mg total) by mouth daily.   aspirin 81 MG chewable tablet Commonly known as:  ASPIRIN CHILDRENS Chew 1 tablet (81 mg total) by mouth daily.   furosemide 40 MG tablet Commonly known as:  LASIX Take 40 mg by mouth daily as needed (leg swelling).   insulin aspart 100 UNIT/ML injection Commonly known as:  novoLOG Inject 0-15 Units into the skin every 4 (four) hours.   irbesartan 300 MG tablet Commonly known as:  AVAPRO  Take 1 tablet (300 mg total) by mouth daily.   levothyroxine 200 MCG tablet Commonly known as:  SYNTHROID, LEVOTHROID Take 200 mcg by mouth daily before breakfast. For hypothyroidism   metoprolol succinate 100 MG 24 hr tablet Commonly known as:  TOPROL-XL Take 100 mg by mouth daily. Take with or immediately following a meal.   NIFEdipine 30 MG 24 hr tablet Commonly known as:  PROCARDIA-XL/ADALAT CC Take 1 tablet (30 mg total) by mouth 2 (two) times daily as needed (systolic BP over 998 mmHg).   nystatin 100000 UNIT/ML suspension Commonly known as:  MYCOSTATIN Take 5 mLs by mouth. Take 4 times daily as needed for thrush   ondansetron 4 MG disintegrating tablet Commonly known as:  ZOFRAN-ODT Take 1 tablet (4 mg total) by mouth every 6 (six) hours as needed for nausea.   oxyCODONE-acetaminophen 5-325 MG tablet Commonly known as:  PERCOCET/ROXICET Take 0.5-1 tablets by mouth every 4 (four) hours as needed for moderate pain. Depends on pain if takes 0.5-1 tablet   pantoprazole 40 MG tablet Commonly known as:  PROTONIX Take 1  tablet (40 mg total) by mouth at bedtime.   polyethylene glycol packet Commonly known as:  MIRALAX / GLYCOLAX Take 17 g by mouth daily.   sodium chloride flush 0.9 % Soln Commonly known as:  NS 10-40 mLs by Intracatheter route as needed (flush).   telmisartan-hydrochlorothiazide 80-25 MG tablet Commonly known as:  MICARDIS HCT Take 1 tablet by mouth daily.       No orders of the defined types were placed in this encounter.   Immunization History  Administered Date(s) Administered  . PPD Test 02/14/2016    Social History  Substance Use Topics  . Smoking status: Never Smoker  . Smokeless tobacco: Never Used  . Alcohol use No    Review of Systems  DATA OBTAINED: from patient, nurse-As per history of present illness GENERAL:  no fevers, fatigue, appetite changes SKIN: No itching, rash HEENT: No complaint RESPIRATORY: No cough, wheezing, SOB CARDIAC: No chest pain, palpitations, lower extremity edema  GI: No abdominal pain, + nausea and vomiting, no/D or constipation, + reflux  GU: No dysuria, frequency or urgency, or incontinence  MUSCULOSKELETAL: No unrelieved bone/joint pain NEUROLOGIC: No headache, dizziness  PSYCHIATRIC: No overt anxiety or sadness  Vitals:   04/07/17 1605  BP: 136/77  Pulse: 91  Resp: 20  Temp: 98 F (36.7 C)  SpO2: 94%   Body mass index is 33.56 kg/m. Physical Exam  GENERAL APPEARANCE: Alert, conversant, No acute distress  SKIN: No diaphoresis rash HEENT: Unremarkable RESPIRATORY: Breathing is even, unlabored. Lung sounds are clear   CARDIOVASCULAR: Heart RRR no murmurs, rubs or gallops. No peripheral edema  GASTROINTESTINAL: Abdomen is soft, Mild gastric tenderness , mild-moderate distended w/ decreased bowel sounds; both tube sites look clean and uninfected GENITOURINARY: Bladder non tender, not distended  MUSCULOSKELETAL: No abnormal joints or musculature NEUROLOGIC: Cranial nerves 2-12 grossly intact. Moves all  extremities PSYCHIATRIC: Mood and affect with some depression not unusual for this situation, motivated to get better, no behavioral issues  Patient Active Problem List   Diagnosis Date Noted  . Acute DVT of left tibial vein ([place]) 04/03/2017  . Hematoma of rectus sheath 04/03/2017  . GERD (gastroesophageal reflux disease) 04/03/2017  . Malnutrition of moderate degree 02/07/2017  . Gastroparesis 02/06/2017  . Adenocarcinoma ([place]) 01/12/2017  . 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 Essential hypertension 02/11/2016  . Lumbar stenosis with neurogenic claudication 02/10/2016  .  Hyperlipidemia 02/10/2016  . Hypothyroidism 02/10/2016  . Musculoskeletal neck pain 12/13/2014  . Muscular pain 12/13/2014  . Numbness   . Acute CVA (cerebrovascular accident) ([place]) 09/16/2014  . Right renal mass 09/13/2014  . Cerebral infarction due to unspecified mechanism   . CVA (cerebral infarction) 09/11/2014  . Osteoarthritis 09/11/2014  . Postsurgical hypothyroidism 09/11/2014  . Uncontrolled hypertension 09/11/2014  . History of CVA (cerebral vascular accident) ([place]) 09/11/2014  . Diabetes mellitus without complication ([place]) 03/47/4259  . Dyslipidemia 04/20/2007    CMP     Component Value Date/Time   NA 136 03/29/2017 0508   NA 141 11/13/2016 1329   K 4.2 03/29/2017 0508   K 3.5 11/13/2016 1329   CL 105 03/29/2017 0508   CO2 25 03/29/2017 0508   CO2 24 11/13/2016 1329   GLUCOSE 139 (H) 03/29/2017 0508   GLUCOSE 78 11/13/2016 1329   BUN 28 (H) 03/29/2017 0508   BUN 18.7 11/13/2016 1329   CREATININE 0.92 03/29/2017 0508   CREATININE 1.0 11/13/2016 1329   CALCIUM 8.6 (L) 03/29/2017 0508   CALCIUM 9.3 11/13/2016 1329   PROT 7.4 03/29/2017 0508   PROT 8.0 11/13/2016 1329   ALBUMIN 2.9 (L) 03/29/2017 0508   ALBUMIN 3.5 11/13/2016 1329   AST 26 03/29/2017 0508   AST 24 11/13/2016 1329   ALT 21 03/29/2017 0508   ALT 19 11/13/2016 1329   ALKPHOS 82 03/29/2017 0508   ALKPHOS 152 (H)  11/13/2016 1329   BILITOT 0.6 03/29/2017 0508   BILITOT 1.44 (H) 11/13/2016 1329   GFRNONAA 58 (L) 03/29/2017 0508   GFRAA >60 03/29/2017 0508    Recent Labs  03/22/17 0543 03/25/17 0437  03/27/17 0427 03/28/17 0500 03/29/17 0508  NA 137 138  < > 139 136 136  K 3.9 3.1*  < > 3.4* 4.3 4.2  CL 105 102  < > 112* 105 105  CO2 26 29  < > 20* 25 25  GLUCOSE 217* 151*  < > 140* 157* 139*  BUN 8 10  < > 19 26* 28*  CREATININE 0.71 0.69  < > 0.65 0.95 0.92  CALCIUM 8.0* 8.1*  < > 6.9* 8.4* 8.6*  MG 1.8 2.1  --   --  2.0 1.9  PHOS 2.8 3.5  --   --   --  4.3  < > = values in this interval not displayed.  Recent Labs  03/22/17 0543 03/25/17 0437 03/29/17 0508  AST 21 38 26  ALT 18 31 21   ALKPHOS 92 99 82  BILITOT 0.7 0.6 0.6  PROT 7.1 7.6 7.4  ALBUMIN 2.6* 2.8* 2.9*    Recent Labs  03/15/17 0336 03/22/17 0543 03/29/17 0508  WBC 10.2 7.5 9.0  NEUTROABS 7.9* 4.8 6.1  HGB 8.2* 9.4* 10.1*  HCT 26.1* 29.4* 31.1*  MCV 92.9 94.2 93.4  PLT 436* 427* 432*    Recent Labs  03/15/17 0336 03/22/17 0552 03/29/17 0508  TRIG 95 93 94   No results found for: [facility] Lab Results  Component Value Date   TSH 0.966 02/12/2016   Lab Results  Component Value Date   HGBA1C 9.9 (H) 01/04/2017   Lab Results  Component Value Date   CHOL 149 09/13/2014   HDL 50 09/13/2014   LDLCALC 73 09/13/2014   TRIG 94 03/29/2017   CHOLHDL 3.0 09/13/2014    Significant Diagnostic Results in last 30 days:  Ct Abdomen Pelvis W Contrast  Result Date: 03/25/2017 CLINICAL DATA:  Status  post Whipple procedure, gastrostomy and surgical jejunostomy with persistent intolerance of tube feeds both at the level of the stomach and jejunum and postoperative gastroparesis. Spontaneous left rectus hematoma of related to anti coagulation. EXAM: CT ABDOMEN AND PELVIS WITH CONTRAST TECHNIQUE: Multidetector CT imaging of the abdomen and pelvis was performed using the standard protocol following bolus  administration of intravenous contrast. CONTRAST:  158mL ISOVUE-300 IOPAMIDOL (ISOVUE-300) INJECTION 61% COMPARISON:  03/02/2017 and 02/06/2017 unenhanced CT's, 10/23/2016 enhanced CT FINDINGS: Lower chest: No acute abnormality. Hepatobiliary: The abnormal attenuation within the left lobe of the liver seen by the prior unenhanced CT does not have the typical appearance of metastatic disease on the contrast-enhanced study and appears to represent irregular fatty infiltration as well as potentially some atrophy of the left lobe. There is no associated evidence to suggest biliary obstruction and no evidence of portal or hepatic venous thrombosis. Follow-up is recommended and eventual MRI examination of the liver may be helpful for clarification. Pancreas: Post Whipple anatomy is stable. No abnormal fluid collections or evidence of recurrent masses. Small cystic area measuring 1.2 cm at the tail of the pancreas remains stable. Spleen: Normal in size without focal abnormality. Adrenals/Urinary Tract: Adrenal glands are unremarkable. Contracting renal ablation defect along the medial aspect of the upper pole of the right kidney with nonenhancing scar tissue now measuring roughly 1.9 x 2.7 cm compared to approximately 2.3 x 3.4 cm at a similar level on the prior contrast enhanced study in March. Kidneys show no renal calculi or hydronephrosis. Bladder is unremarkable. Stomach/Bowel: A gastrostomy tube enters the gastric lumen and shows stable position. Coaxial catheter is coiled in the stomach. There is some fluid in the stomach. No evidence of bowel obstruction. Interval placement of jejunostomy tube which enters the left abdominal wall and extends well into the jejunum. Jejunum at the level of the jejunostomy and distally is decompressed and without evidence of obstruction. The colon is decompressed. No free air or abscess. Vascular/Lymphatic: IVC filter has been placed which is in appropriate position. No thrombus is  present in the filter. Reproductive: Status post hysterectomy. No adnexal masses. Other: Slight interval lower density within the large lower rectus sheath hematoma is consistent with evolving hemorrhage. The hematoma is not larger in size. Musculoskeletal: Stable bony structures with prior fusion at L4-5 and degenerative disc disease at L3-4. IMPRESSION: 1. Irregular low-attenuation within the left lobe of the liver does not have the typical appearance of metastatic disease and appears to represent irregular geographic fatty infiltration with some associated left lobe atrophy. Eventual MRI correlation may be helpful for follow-up. 2. Stable positioning of gastrostomy. 3. Interval jejunostomy appears in good position and extends well into the jejunal lumen without evidence of complication. The jejunum and rest of small bowel are decompressed. 4. Evolving large lower rectus sheath hematoma which shows lower internal density consistent with evolving blood product. No evidence of hematoma enlargement. 5. Contracting renal ablation defect at the upper pole of the right kidney with no evidence of recurrent enhancing neoplasm. Electronically Signed   By: Aletta Edouard M.D.   On: 03/25/2017 00:19    Assessment and Plan  VOMITING/GE REFLUX/gastroparesis/TPN-had a long discussion with the patient underwent patient's symptoms are exactly the times she was having in the hospital before she was discharged to skilled nursing; I believe the plan is get patient stronger her on TPN and with physical therapy; I don't see anything different at the hospital could be doing than what is going on here  and patient does not want to go back to the hospital; I think patient stable enough to stay here and continue with her TPN, we will stop fluids see up's as it seemed to precipitate vomiting and will continue her PT OT as she tolerates we'll continue to monitor    Time spent greater than 35 minutes George Haggart D. Sheppard Coil, MD

## 2017-04-08 ENCOUNTER — Encounter: Payer: Self-pay | Admitting: Internal Medicine

## 2017-04-08 DIAGNOSIS — Z789 Other specified health status: Secondary | ICD-10-CM | POA: Insufficient documentation

## 2017-04-09 LAB — CBC AND DIFFERENTIAL
HCT: 34 — AB (ref 36–46)
Hemoglobin: 11.2 — AB (ref 12.0–16.0)
Platelets: 258 (ref 150–399)
WBC: 7.6

## 2017-04-09 LAB — HEPATIC FUNCTION PANEL
ALT: 15 (ref 7–35)
AST: 21 (ref 13–35)
Alkaline Phosphatase: 85 (ref 25–125)
Bilirubin, Total: 0.8

## 2017-04-09 LAB — BASIC METABOLIC PANEL
BUN: 36 — AB (ref 4–21)
Creatinine: 1 (ref 0.5–1.1)
Glucose: 225
Potassium: 3.9 (ref 3.4–5.3)
Sodium: 36 — AB (ref 137–147)

## 2017-04-09 LAB — LIPID PANEL
Cholesterol: 120 (ref 0–200)
HDL: 35 (ref 35–70)
LDL Cholesterol: 58
Triglycerides: 137 (ref 40–160)

## 2017-04-12 ENCOUNTER — Other Ambulatory Visit: Payer: Self-pay

## 2017-04-20 LAB — BASIC METABOLIC PANEL
BUN: 27 — AB (ref 4–21)
Creatinine: 0.9 (ref 0.5–1.1)
Glucose: 211
Potassium: 3.6 (ref 3.4–5.3)
Sodium: 139 (ref 137–147)

## 2017-04-20 LAB — CBC AND DIFFERENTIAL
HCT: 40 (ref 36–46)
Hemoglobin: 12.4 (ref 12.0–16.0)
Platelets: 265 (ref 150–399)
WBC: 7.3

## 2017-04-20 LAB — HEPATIC FUNCTION PANEL
ALT: 14 (ref 7–35)
AST: 21 (ref 13–35)
Alkaline Phosphatase: 82 (ref 25–125)
Bilirubin, Total: 0.6

## 2017-04-23 ENCOUNTER — Other Ambulatory Visit: Payer: Self-pay

## 2017-04-27 ENCOUNTER — Encounter: Payer: Self-pay | Admitting: Internal Medicine

## 2017-04-27 ENCOUNTER — Non-Acute Institutional Stay (SKILLED_NURSING_FACILITY): Payer: Medicare Other | Admitting: Internal Medicine

## 2017-04-27 DIAGNOSIS — C241 Malignant neoplasm of ampulla of Vater: Secondary | ICD-10-CM

## 2017-04-27 DIAGNOSIS — S301XXD Contusion of abdominal wall, subsequent encounter: Secondary | ICD-10-CM

## 2017-04-27 DIAGNOSIS — K219 Gastro-esophageal reflux disease without esophagitis: Secondary | ICD-10-CM | POA: Diagnosis not present

## 2017-04-27 DIAGNOSIS — Z789 Other specified health status: Secondary | ICD-10-CM

## 2017-04-27 DIAGNOSIS — K3184 Gastroparesis: Secondary | ICD-10-CM | POA: Diagnosis not present

## 2017-04-27 DIAGNOSIS — I10 Essential (primary) hypertension: Secondary | ICD-10-CM

## 2017-04-27 DIAGNOSIS — E119 Type 2 diabetes mellitus without complications: Secondary | ICD-10-CM

## 2017-04-27 DIAGNOSIS — I82442 Acute embolism and thrombosis of left tibial vein: Secondary | ICD-10-CM

## 2017-04-27 LAB — HEPATIC FUNCTION PANEL
ALT: 16 (ref 7–35)
AST: 27 (ref 13–35)
Alkaline Phosphatase: 91 (ref 25–125)
Bilirubin, Total: 0.9

## 2017-04-27 LAB — BASIC METABOLIC PANEL
BUN: 26 — AB (ref 4–21)
Creatinine: 0.9 (ref 0.5–1.1)
Glucose: 168
Potassium: 4 (ref 3.4–5.3)
Sodium: 138 (ref 137–147)

## 2017-04-27 NOTE — Progress Notes (Signed)
Location:  [facility] Room Number: [place]:  SNF [id number])  Provider: Noah Delaine. Sheppard Coil, MD  PCP: Nolene Ebbs, MD Patient Care Team: Nolene Ebbs, MD as PCP - General  Extended Emergency Contact Information Primary Emergency Contact: Soto,Sheila Address: [address]          [place], [place] [address] Johnnette Litter of [place] Phone: [phone] Work Phone: [phone] Mobile Phone: [phone] Relation: Daughter Secondary Emergency Contact: Jerilee Field Address: [address]          [place], [place] [address] Johnnette Litter of [place] Phone: [phone] Mobile Phone: [phone] Relation: Son  Allergies  Allergen Reactions  . Hydralazine Hcl Shortness Of Breath    Pt had severe respiratory distress after receiving a dose  . Darvon [Propoxyphene Hcl] Other (See Comments)    Hallucinations   . Nyquil Multi-Symptom [Pseudoeph-Doxylamine-Dm-Apap] Other (See Comments)    Makes pt not "feel right in her head"  . Metformin And Related Diarrhea    Chief Complaint  Patient presents with  . Discharge Note    discharge from SNF to home    HPI:  81 y.o. female  without warning, get a history.. 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 She developed an acute DVT at this time. Patient was maintained on TPN. Patient was readmitted back to [facility] from 6/22-8/13 for continued management of her gastroparesis. To make a long story short  her GJ tube as well as a J-tube all are not operable for feeding. During this time patient developed abdominal rectus sheath hematoma and was unable to be on anticoagulation and an IVC filter was placed. Patient's GJ-tube was hooked up and has been hooked up to suction to keep patient from vomiting. Patient has attempted small sips and  still is nauseated with that. Patient has been maintained on TPN while she has been at the skilled nursing facility and will be DC'd on TPN to home.    Past Medical History:  Diagnosis Date  . Acute CVA (cerebrovascular accident) ([place]) 09/16/2014  . Acute DVT of left tibial vein ([place]) 04/03/2017  . Adenocarcinoma ([place]) 01/12/2017  . 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 Anxiety   . Arthritis    knees  . Black tarry stools    05-14-16 negative for occult blood with ER visit- noted in [place].  . Cancer [facility])    thyroid cancer- surgery and radiation  . Cerebral infarction due to unspecified mechanism   . Chronic kidney disease    questionable mass on kidney. Being followed by Dr Diona Fanti  . Complication of anesthesia    heart rate was really low  . CVA (cerebral infarction) 09/11/2014  . Depression   . Diabetes mellitus    type 2  . Diabetes mellitus without complication ([place]) 3/0/1601   Qualifier: Diagnosis of  By: Loanne Drilling MD, Jacelyn Pi   . Dyslipidemia 04/20/2007   Qualifier: Diagnosis of  By: Loanne Drilling MD, Jacelyn Pi   . Full dentures   . Gastroparesis 02/06/2017  . GERD (gastroesophageal reflux disease) 04/03/2017  . History of CVA (cerebral vascular accident) ([place]) 09/11/2014  . Hypertension   . Hypothyroidism   . Lumbar stenosis with neurogenic claudication 02/10/2016  . Osteoarthritis 09/11/2014  . Pneumonia   . Right renal mass 09/13/2014  . Spinal stenosis   .  Stroke [facility]) 09/2014   left sided weakness    Past Surgical History:  Procedure Laterality Date  . ABDOMINAL HYSTERECTOMY     partial  . cataracts     Removed  11/2015  bilateral  . COLONOSCOPY W/ POLYPECTOMY    . ERCP N/A 05/07/2016   Procedure: ENDOSCOPIC RETROGRADE CHOLANGIOPANCREATOGRAPHY (ERCP);  Surgeon: Clarene Essex, MD;  Location: [facility] ENDOS[facility];  Service: Endoscopy;  Laterality: N/A;  . ERCP N/A 10/16/2016   Procedure: ENDOSCOPIC RETROGRADE CHOLANGIOPANCREATOGRAPHY (ERCP);  Surgeon: Clarene Essex, MD;  Location: [facility]  ENDOSCOPY;  Service: Endoscopy;  Laterality: N/A;  . ESOPHAGOGASTRODUODENOSCOPY N/A 02/10/2017   Procedure: ESOPHAGOGASTRODUODENOSCOPY (EGD);  Surgeon: Teena Irani, MD;  Location: [facility] ENDOSCOPY;  Service: Endoscopy;  Laterality: N/A;  . EUS N/A 05/27/2016   Procedure: ESOPHAGEAL ENDOSCOPIC ULTRASOUND (EUS) RADIAL;  Surgeon: Arta Silence, MD;  Location: [facility] ENDOSCOPY;  Service: Endoscopy;  Laterality: N/A;  . FLEXIBLE SIGMOIDOSCOPY  03/29/2012   Procedure: FLEXIBLE SIGMOIDOSCOPY;  Surgeon: Jeryl Columbia, MD;  Location: [facility] ENDOSCOPY;  Service: Endoscopy;  Laterality: N/A;  fleet enema upon arrival  . HOT HEMOSTASIS  03/29/2012   Procedure: HOT HEMOSTASIS (ARGON PLASMA COAGULATION/BICAP);  Surgeon: Jeryl Columbia, MD;  Location: [facility] ENDOSCOPY;  Service: Endoscopy;  Laterality: N/A;  . IR GASTR TUBE CONVERT GASTR-JEJ PER W/FL MOD SED  02/23/2017  . IR GASTROSTOMY TUBE MOD SED  02/16/2017  . IR GENERIC HISTORICAL  06/30/2016   IR RADIOLOGIST EVAL & MGMT 06/30/2016 Aletta Edouard, MD [facility] INTERV RAD  . IR GENERIC HISTORICAL  09/09/2016   IR RADIOLOGIST EVAL & MGMT 09/09/2016 Aletta Edouard, MD [facility] INTERV RAD  . IR IVC FILTER PLMT / S&I /IMG GUID/MOD SED  03/03/2017  . LAPAROSCOPY N/A 01/12/2017   Procedure: LAPAROSCOPY DIAGNOSTIC;  Surgeon: Stark Klein, MD;  Location: [facility];  Service: General;  Laterality: N/A;  . LAPAROTOMY N/A 03/10/2017   Procedure: EXPLORATORY LAPAROTOMY Open jejunostomy tube;  Surgeon: Stark Klein, MD;  Location: [facility];  Service: General;  Laterality: N/A;  . LUMBAR LAMINECTOMY/DECOMPRESSION MICRODISCECTOMY N/A 02/10/2016   Procedure: Lumbar three- four Laminectomy;  Surgeon: Kristeen Miss, MD;  Location: [facility] NEURO ORS;  Service: Neurosurgery;  Laterality: N/A;  L3-4 Laminectomy  . LUMBAR SPINE SURGERY     1st surgery "ray cage placed"  . THYROIDECTOMY  2005  . TONSILLECTOMY    . WHIPPLE PROCEDURE N/A 01/12/2017   Procedure: WHIPPLE PROCEDURE;  Surgeon: Stark Klein, MD;  Location:  [facility];  Service: General;  Laterality: N/A;     reports that she has never smoked. She has never used smokeless tobacco. She reports that she does not drink alcohol or use drugs. Social History   Social History  . Marital status: Widowed    Spouse name: N/A  . Number of children: N/A  . Years of education: N/A   Occupational History  . 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 Smoking status: Never Smoker  . Smokeless tobacco: Never Used  . Alcohol use No  . Drug use: No  . Sexual activity: No   Other Topics Concern  . Not on file   Social History Narrative   Widowed,  retired, 3 children living, 2 children deceased   Caffeine use - soda every few days   Right handed   Pt lives alone.  Using cane when out and about.    Admitted to [facility] 03/29/17   Never smoked  Alcohol none    Full Code    Pertinent  Health Maintenance Due  Topic Date Due  . FOOT EXAM  07/26/1946  . OPHTHALMOLOGY EXAM  07/26/1946  . PNA vac Low Risk Adult (1 of 2 - PCV13) 07/27/2003  . INFLUENZA VACCINE  03/17/2017  . HEMOGLOBIN A1C  07/07/2017  . DEXA SCAN  Completed    Medications: Allergies as of 04/27/2017      Reactions   Hydralazine Hcl Shortness Of Breath   Pt had severe respiratory distress after receiving a dose   Darvon [propoxyphene Hcl] Other (See Comments)   Hallucinations   Nyquil Multi-symptom [pseudoeph-doxylamine-dm-apap] Other (See Comments)   Makes pt not "feel right in her head"   Metformin And Related Diarrhea      Medication List       Accurate as of 04/27/17  1:53 PM. Always use your most recent med list.          albuterol (2.5 MG/3ML) 0.083% nebulizer solution Commonly known as:  PROVENTIL Take 3 mLs (2.5 mg total) by nebulization every 4 (four) hours as needed for wheezing or shortness of breath.   amLODipine 10 MG tablet Commonly known as:  NORVASC Take 1 tablet (10 mg total) by mouth daily.   aspirin 81 MG  chewable tablet Commonly known as:  ASPIRIN CHILDRENS Chew 1 tablet (81 mg total) by mouth daily.   furosemide 40 MG tablet Commonly known as:  LASIX Take 40 mg by mouth daily as needed (leg swelling).   insulin aspart 100 UNIT/ML injection Commonly known as:  novoLOG Inject 0-15 Units into the skin every 4 (four) hours.   irbesartan 300 MG tablet Commonly known as:  AVAPRO Take 1 tablet (300 mg total) by mouth daily.   levothyroxine 200 MCG tablet Commonly known as:  SYNTHROID, LEVOTHROID Take 200 mcg by mouth daily before breakfast. For hypothyroidism   metoprolol succinate 100 MG 24 hr tablet Commonly known as:  TOPROL-XL Take 100 mg by mouth daily. Take with or immediately following a meal.   NIFEdipine 30 MG 24 hr tablet Commonly known as:  PROCARDIA-XL/ADALAT CC Take 1 tablet (30 mg total) by mouth 2 (two) times daily as needed (systolic BP over 081 mmHg).   nystatin 100000 UNIT/ML suspension Commonly known as:  MYCOSTATIN Take 5 mLs by mouth. Take 4 times daily as needed for thrush   ondansetron 4 MG disintegrating tablet Commonly known as:  ZOFRAN-ODT Take 1 tablet (4 mg total) by mouth every 6 (six) hours as needed for nausea.   pantoprazole 40 MG tablet Commonly known as:  PROTONIX Take 1 tablet (40 mg total) by mouth at bedtime.   PERCOCET 5-325 MG tablet Generic drug:  oxyCODONE-acetaminophen Take 1 tablet by mouth. Take 1/2 tablet every four hours as needed for mild to moderate pain. Take one tablet every four hours as needed for moderate to severe pain.   polyethylene glycol packet Commonly known as:  MIRALAX / GLYCOLAX Take 17 g by mouth daily.   promethazine 25 MG/ML injection Commonly known as:  PHENERGAN Inject into the vein. Give 25 mg/ml IM every 6 hours as needed for nausea or vomiting   sodium chloride flush 0.9 % Soln Commonly known as:  NS 10-40 mLs by Intracatheter route as needed (flush).   telmisartan-hydrochlorothiazide 80-25 MG  tablet Commonly known as:  MICARDIS HCT Take 1 tablet by mouth daily.   tpn solution (CLINIMIX 5/15) *NO Electrolytes* 5 % Soln Inject into the vein continuous.  2000 ml IV over 24 hour TPN cycle as needed        Vitals:   04/27/17 0839  BP: 136/74  Pulse: 70  Resp: 18  Temp: 97.9 F (36.6 C)  Weight: 193 lb (87.5 kg)  Height: 5\' 4"  (1.626 m)   Body mass index is 33.13 kg/m.  Physical Exam  GENERAL APPEARANCE: Alert, conversant. No acute distress.  HEENT: Unremarkable. RESPIRATORY: Breathing is even, unlabored. Lung sounds are clear   CARDIOVASCULAR: Heart RRR no murmurs, rubs or gallops. No peripheral edema.  GASTROINTESTINAL: Abdomen is soft, non-tender, not distended w/ normal bowel sounds.; GJ tube to suction;J tube clamped.  NEUROLOGIC: Cranial nerves 2-12 grossly intact. Moves all extremities   Labs reviewed: Basic Metabolic Panel:  Recent Labs  03/22/17 0543 03/25/17 0437  03/27/17 0427 03/28/17 0500 03/29/17 0508 04/09/17 04/20/17  NA 137 138  < > 139 136 136 36* 139  K 3.9 3.1*  < > 3.4* 4.3 4.2 3.9 3.6  CL 105 102  < > 112* 105 105  --   --   CO2 26 29  < > 20* 25 25  --   --   GLUCOSE 217* 151*  < > 140* 157* 139*  --   --   BUN 8 10  < > 19 26* 28* 36* 27*  CREATININE 0.71 0.69  < > 0.65 0.95 0.92 1.0 0.9  CALCIUM 8.0* 8.1*  < > 6.9* 8.4* 8.6*  --   --   MG 1.8 2.1  --   --  2.0 1.9  --   --   PHOS 2.8 3.5  --   --   --  4.3  --   --   < > = values in this interval not displayed. No results found for: [facility] Liver Function Tests:  Recent Labs  03/22/17 0543 03/25/17 0437 03/29/17 0508 04/09/17 04/20/17  AST 21 38 26 21 21   ALT 18 31 21 15 14   ALKPHOS 92 99 82 85 82  BILITOT 0.7 0.6 0.6  --   --   PROT 7.1 7.6 7.4  --   --   ALBUMIN 2.6* 2.8* 2.9*  --   --     Recent Labs  05/14/16 1258 02/05/17 2039  LIPASE 18 16   No results for input(s): AMMONIA in the last 8760 hours. CBC:  Recent Labs  03/15/17 0336 03/22/17 0543  03/29/17 0508 04/09/17 04/20/17  WBC 10.2 7.5 9.0 7.6 7.3  NEUTROABS 7.9* 4.8 6.1  --   --   HGB 8.2* 9.4* 10.1* 11.2* 12.4  HCT 26.1* 29.4* 31.1* 34* 40  MCV 92.9 94.2 93.4  --   --   PLT 436* 427* 432* 258 265   Lipid  Recent Labs  03/22/17 0552 03/29/17 0508 04/09/17  CHOL  --   --  120  HDL  --   --  35  LDLCALC  --   --  58  TRIG 93 94 137   Cardiac Enzymes: No results for input(s): CKTOTAL, CKMB, CKMBINDEX, TROPONINI in the last 8760 hours. BNP: No results for input(s): BNP in the last 8760 hours. CBG:  Recent Labs  03/29/17 0756 03/29/17 1245 03/29/17 1621  GLUCAP 133* 143* 209*    Procedures and Imaging Studies During Stay: No results found.  Assessment/Plan:   Ampullary carcinoma (HCC)  Gastroparesis  On total parenteral nutrition (TPN)  Hematoma of rectus sheath, subsequent encounter  Gastroesophageal reflux disease without esophagitis  Acute DVT  of left tibial vein (HCC)  Diabetes mellitus without complication ([place])  Essential hypertension   Patient is being discharged with the following home health services: TPN and labs per protocol;RN for  Administration and teaching;OT/PT   Patient is being discharged with the following durable medical equipment:  Wheelchair  Patient has been advised to f/u with their PCP in 1-2 weeks to bring them up to date on their rehab stay.  Social services at facility was responsible for arranging this appointment.  Pt was provided with a 30 day supply of prescriptions for medications and refills must be obtained from their PCP.  For controlled substances, a more limited supply may be provided adequate until PCP appointment only.  Medications have been reconciled.    Time spent greater than 30 minutes;> 50% of time with patient was spent reviewing records, labs, tests and studies, counseling and developing plan of care  Noah Delaine. Sheppard Coil, MD

## 2017-04-30 ENCOUNTER — Other Ambulatory Visit: Payer: Self-pay

## 2017-05-11 ENCOUNTER — Inpatient Hospital Stay (HOSPITAL_COMMUNITY): Admission: RE | Admit: 2017-05-11 | Payer: Medicare Other | Source: Ambulatory Visit

## 2017-05-11 ENCOUNTER — Other Ambulatory Visit (HOSPITAL_COMMUNITY): Payer: Self-pay | Admitting: Interventional Radiology

## 2017-05-11 DIAGNOSIS — Z452 Encounter for adjustment and management of vascular access device: Secondary | ICD-10-CM

## 2017-05-12 ENCOUNTER — Telehealth: Payer: Self-pay | Admitting: Oncology

## 2017-05-12 ENCOUNTER — Ambulatory Visit (HOSPITAL_COMMUNITY)
Admission: RE | Admit: 2017-05-12 | Discharge: 2017-05-12 | Disposition: A | Discharge status: Home / Self Care | Payer: Medicare Other | Source: Ambulatory Visit | Attending: Interventional Radiology | Admitting: Interventional Radiology

## 2017-05-12 DIAGNOSIS — Z452 Encounter for adjustment and management of vascular access device: Secondary | ICD-10-CM

## 2017-05-12 HISTORY — PX: IR PATIENT EVAL TECH 0-60 MINS: IMG5564

## 2017-05-12 NOTE — Telephone Encounter (Signed)
Spoke with patient regarding her appt in Nov. Sending her a confirmation letter as well.

## 2017-05-14 ENCOUNTER — Encounter (HOSPITAL_COMMUNITY): Payer: Self-pay

## 2017-05-14 NOTE — Procedures (Signed)
Pt came to IR for picc evaluation due to inability to remove caps at the end of the PICC by home health rn.  IR staff was able to remove these, clean the hubs of the line and place new caps on the PICC

## 2017-05-25 ENCOUNTER — Other Ambulatory Visit: Payer: Self-pay | Admitting: Internal Medicine

## 2017-06-19 ENCOUNTER — Other Ambulatory Visit: Payer: Self-pay | Admitting: Internal Medicine

## 2017-06-21 ENCOUNTER — Other Ambulatory Visit (HOSPITAL_COMMUNITY): Payer: Self-pay | Admitting: Internal Medicine

## 2017-06-21 DIAGNOSIS — R609 Edema, unspecified: Secondary | ICD-10-CM

## 2017-06-23 ENCOUNTER — Encounter: Payer: Self-pay | Admitting: Nurse Practitioner

## 2017-06-23 ENCOUNTER — Ambulatory Visit (HOSPITAL_BASED_OUTPATIENT_CLINIC_OR_DEPARTMENT_OTHER): Payer: Medicare Other | Admitting: Nurse Practitioner

## 2017-06-23 VITALS — BP 148/75 | HR 94 | Temp 98.4°F | Resp 18 | Ht 64.0 in | Wt 207.2 lb

## 2017-06-23 DIAGNOSIS — I10 Essential (primary) hypertension: Secondary | ICD-10-CM

## 2017-06-23 DIAGNOSIS — E119 Type 2 diabetes mellitus without complications: Secondary | ICD-10-CM

## 2017-06-23 DIAGNOSIS — Z85850 Personal history of malignant neoplasm of thyroid: Secondary | ICD-10-CM | POA: Diagnosis not present

## 2017-06-23 DIAGNOSIS — C241 Malignant neoplasm of ampulla of Vater: Secondary | ICD-10-CM

## 2017-06-23 NOTE — Progress Notes (Addendum)
  [facility] OFFICE PROGRESS NOTE   Diagnosis: Ampullary carcinoma  INTERVAL HISTORY:   Ms. Jentsch returns for follow-up.  She underwent pancreaticoduodenectomy 01/12/2017.  She had a prolonged hospital course followed by a stay at a nursing facility.  She has since returned home.  She feels overall she is doing well.  She has a good appetite.  She is working on increasing activity.  She has occasional pain at a previous tube site at the left abdomen.  No recent nausea/vomiting.  She is having 2 or 3 bowel movements a day.  No diarrhea.  Her main complaint today is leg edema.    Objective:  Vital signs in last 24 hours:  Blood pressure (!) 148/75, pulse 94, temperature 98.4 F (36.9 C), temperature source Oral, resp. rate 18, height 5\' 4"  (1.626 m), weight 207 lb 3.2 oz (94 kg), SpO2 100 %.    HEENT: No thrush or ulcers. Lymphatics: No palpable cervical, supraclavicular lymph nodes. Resp: Lungs clear bilaterally. Cardio: Regular rate and rhythm. GI: Abdomen soft and nontender.  No hepatomegaly.  Small area of nodularity at the left upper abdomen tube site scar. Vascular: Pitting edema at the lower legs bilaterally.   Lab Results:  Lab Results  Component Value Date   WBC 7.3 04/20/2017   HGB 12.4 04/20/2017   HCT 40 04/20/2017   MCV 93.4 03/29/2017   PLT 265 04/20/2017   NEUTROABS 6.1 03/29/2017    Imaging:  No results found.  Medications: I have reviewed the patient's current medications.  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 CT abdomen/pelvis 10/23/2016-ampullary mass, single mildly enlarged porta hepatis lymph node, no evidence of distant metastatic disease ? Status post pancreaticoduodenectomy 01/12/2017; pT3pN2 2. Biliary obstruction secondary to #1, status post placement of a metal, bile duct stent on 10/23/2016  3. Cystic pancreas  lesions-stable on the CT 10/23/2016  4. Renal cell carcinoma-status post ablation of a right renal mass 07/15/2016, biopsy confirmed papillary renal cell carcinoma, Fuhrman grade 3  5. CVA in 2016  6. History of thyroid cancer-status post thyroidectomy and radioactive iodine in 2005  7. Diabetes  8.     Hypertension   Disposition: Ms. Armstead appears stable.  She continues to recover from the pancreaticoduodenectomy/subsequent hospitalizations.  Overall she seems to be doing well.  There is no clinical evidence of disease progression.  She understands that chemotherapy and radiation are not recommended at this time due to the significant time lapse since the initial surgery in May.  She will return for a follow-up visit and CA-19-9 in 6 months.  She will contact the office in the interim with any problems.  Patient seen with Dr. Benay Spice.    [name] ANP/GNP-BC   06/23/2017  12:49 PM  This was a shared visit with [name].  Ms. Hinderliter had a prolonged recovery following the pancreaticoduodenectomy.  She is in clinical remission from ampullary carcinoma.  There is no indication for adjuvant therapy.  She remains at significant risk of developing recurrent pancreas cancer.  She will return for an office visit in 6 months.  [name], MD

## 2017-06-28 ENCOUNTER — Ambulatory Visit (HOSPITAL_COMMUNITY)
Admission: RE | Admit: 2017-06-28 | Discharge: 2017-06-28 | Disposition: A | Discharge status: Home / Self Care | Payer: Medicare Other | Source: Ambulatory Visit | Attending: Internal Medicine | Admitting: Internal Medicine

## 2017-06-28 ENCOUNTER — Telehealth: Payer: Self-pay | Admitting: Nurse Practitioner

## 2017-06-28 DIAGNOSIS — E119 Type 2 diabetes mellitus without complications: Secondary | ICD-10-CM | POA: Diagnosis not present

## 2017-06-28 DIAGNOSIS — I10 Essential (primary) hypertension: Secondary | ICD-10-CM | POA: Diagnosis not present

## 2017-06-28 DIAGNOSIS — I082 Rheumatic disorders of both aortic and tricuspid valves: Secondary | ICD-10-CM | POA: Diagnosis not present

## 2017-06-28 DIAGNOSIS — Z8673 Personal history of transient ischemic attack (TIA), and cerebral infarction without residual deficits: Secondary | ICD-10-CM | POA: Insufficient documentation

## 2017-06-28 DIAGNOSIS — E785 Hyperlipidemia, unspecified: Secondary | ICD-10-CM | POA: Insufficient documentation

## 2017-06-28 DIAGNOSIS — R609 Edema, unspecified: Secondary | ICD-10-CM | POA: Diagnosis not present

## 2017-06-28 NOTE — Telephone Encounter (Signed)
Scheduled appt per 11/07 los - sent reminder letter in the  Mail - lab and f/u in 6 months.

## 2017-06-28 NOTE — Progress Notes (Signed)
Echocardiogram 2D Echocardiogram has been performed.  Kaylee Mullins 06/28/2017, 1:50 PM

## 2017-06-29 ENCOUNTER — Other Ambulatory Visit (HOSPITAL_COMMUNITY): Payer: Self-pay | Admitting: Interventional Radiology

## 2017-06-29 DIAGNOSIS — C641 Malignant neoplasm of right kidney, except renal pelvis: Secondary | ICD-10-CM

## 2017-07-29 ENCOUNTER — Telehealth: Payer: Self-pay | Admitting: Internal Medicine

## 2017-07-29 NOTE — Telephone Encounter (Signed)
Swelling for over a month, PCP has been trying to manage.  Heart rate was irregular today.   Lasix was increased by PCP from 40 to 65 recently.  Breathing is ok, not in distress.    Legs are 3-4 plus pitting edema. metolazone 2.5 mg BID--lasix is daily. metolazone on its own in the afternoon.   abd girth is no more than usual.  Called office requested cardiology visit for patient.  She was last seen 2017 by Dr. Harrington Challenger.   Had whipple procedure 2 months ago.   Went home on TPN.  Has been recovering since.   BMET drawn today by home care nurse, resulted at [facility].  She will have results faxed to Dr. Theodosia Blender attention tomorrow am. Pt scheduled on DOD schedule tomorrow.

## 2017-07-29 NOTE — Telephone Encounter (Signed)
New message  [facility] calling with concerns of swelling. Please call Amy [phone]    Pt c/o swelling: STAT is pt has developed SOB within 24 hours  1) How much weight have you gained and in what time span? n/a  2) If swelling, where is the swelling located? Both legs and ankles (31cm)  3) Are you currently taking a fluid pill? yes  4) Are you currently SOB? No  5) Do you have a log of your daily weights (if so, list)? 208 today, 12/4 weight 205, 11/28 weight 200  6) Have you gained 3 pounds in a day or 5 pounds in a week? n/a  7) Have you traveled recently? No

## 2017-07-30 ENCOUNTER — Telehealth: Payer: Self-pay | Admitting: *Deleted

## 2017-07-30 ENCOUNTER — Encounter (HOSPITAL_COMMUNITY): Payer: Self-pay | Admitting: Emergency Medicine

## 2017-07-30 ENCOUNTER — Ambulatory Visit (HOSPITAL_COMMUNITY)
Admission: RE | Admit: 2017-07-30 | Discharge: 2017-07-30 | Disposition: A | Discharge status: Home / Self Care | Payer: Medicare Other | Source: Ambulatory Visit | Attending: Cardiology | Admitting: Cardiology

## 2017-07-30 ENCOUNTER — Ambulatory Visit (HOSPITAL_BASED_OUTPATIENT_CLINIC_OR_DEPARTMENT_OTHER)
Admission: RE | Admit: 2017-07-30 | Discharge: 2017-07-30 | Disposition: A | Discharge status: Home / Self Care | Payer: Medicare Other | Source: Ambulatory Visit | Attending: Cardiology | Admitting: Cardiology

## 2017-07-30 ENCOUNTER — Inpatient Hospital Stay (HOSPITAL_COMMUNITY)
Admission: EM | Admit: 2017-07-30 | Discharge: 2017-08-06 | DRG: 300 | Disposition: A | Discharge status: Home Health | Payer: Medicare Other | Attending: Family Medicine | Admitting: Family Medicine

## 2017-07-30 ENCOUNTER — Encounter: Payer: Self-pay | Admitting: Cardiology

## 2017-07-30 ENCOUNTER — Ambulatory Visit (INDEPENDENT_AMBULATORY_CARE_PROVIDER_SITE_OTHER): Payer: Medicare Other | Admitting: Cardiology

## 2017-07-30 VITALS — BP 164/92 | HR 65 | Ht 64.0 in | Wt 206.6 lb

## 2017-07-30 DIAGNOSIS — E1122 Type 2 diabetes mellitus with diabetic chronic kidney disease: Secondary | ICD-10-CM | POA: Diagnosis present

## 2017-07-30 DIAGNOSIS — Z9841 Cataract extraction status, right eye: Secondary | ICD-10-CM

## 2017-07-30 DIAGNOSIS — R0602 Shortness of breath: Secondary | ICD-10-CM

## 2017-07-30 DIAGNOSIS — Z981 Arthrodesis status: Secondary | ICD-10-CM

## 2017-07-30 DIAGNOSIS — R109 Unspecified abdominal pain: Secondary | ICD-10-CM | POA: Diagnosis present

## 2017-07-30 DIAGNOSIS — Z85528 Personal history of other malignant neoplasm of kidney: Secondary | ICD-10-CM

## 2017-07-30 DIAGNOSIS — Z79899 Other long term (current) drug therapy: Secondary | ICD-10-CM

## 2017-07-30 DIAGNOSIS — E1165 Type 2 diabetes mellitus with hyperglycemia: Secondary | ICD-10-CM | POA: Diagnosis present

## 2017-07-30 DIAGNOSIS — I517 Cardiomegaly: Secondary | ICD-10-CM

## 2017-07-30 DIAGNOSIS — E876 Hypokalemia: Secondary | ICD-10-CM | POA: Diagnosis present

## 2017-07-30 DIAGNOSIS — E1143 Type 2 diabetes mellitus with diabetic autonomic (poly)neuropathy: Secondary | ICD-10-CM | POA: Diagnosis present

## 2017-07-30 DIAGNOSIS — Z7902 Long term (current) use of antithrombotics/antiplatelets: Secondary | ICD-10-CM

## 2017-07-30 DIAGNOSIS — I10 Essential (primary) hypertension: Secondary | ICD-10-CM | POA: Diagnosis present

## 2017-07-30 DIAGNOSIS — Z86718 Personal history of other venous thrombosis and embolism: Secondary | ICD-10-CM

## 2017-07-30 DIAGNOSIS — Z8509 Personal history of malignant neoplasm of other digestive organs: Secondary | ICD-10-CM | POA: Diagnosis present

## 2017-07-30 DIAGNOSIS — R918 Other nonspecific abnormal finding of lung field: Secondary | ICD-10-CM

## 2017-07-30 DIAGNOSIS — E1100 Type 2 diabetes mellitus with hyperosmolarity without nonketotic hyperglycemic-hyperosmolar coma (NKHHC): Secondary | ICD-10-CM | POA: Diagnosis not present

## 2017-07-30 DIAGNOSIS — D6489 Other specified anemias: Secondary | ICD-10-CM | POA: Diagnosis present

## 2017-07-30 DIAGNOSIS — I2510 Atherosclerotic heart disease of native coronary artery without angina pectoris: Secondary | ICD-10-CM | POA: Insufficient documentation

## 2017-07-30 DIAGNOSIS — Z9842 Cataract extraction status, left eye: Secondary | ICD-10-CM | POA: Diagnosis not present

## 2017-07-30 DIAGNOSIS — I82403 Acute embolism and thrombosis of unspecified deep veins of lower extremity, bilateral: Secondary | ICD-10-CM | POA: Diagnosis present

## 2017-07-30 DIAGNOSIS — I700 Atherosclerosis of aorta: Secondary | ICD-10-CM | POA: Insufficient documentation

## 2017-07-30 DIAGNOSIS — E039 Hypothyroidism, unspecified: Secondary | ICD-10-CM | POA: Diagnosis present

## 2017-07-30 DIAGNOSIS — Z90710 Acquired absence of both cervix and uterus: Secondary | ICD-10-CM

## 2017-07-30 DIAGNOSIS — N183 Chronic kidney disease, stage 3 unspecified: Secondary | ICD-10-CM | POA: Diagnosis present

## 2017-07-30 DIAGNOSIS — IMO0002 Reserved for concepts with insufficient information to code with codable children: Secondary | ICD-10-CM | POA: Diagnosis present

## 2017-07-30 DIAGNOSIS — I471 Supraventricular tachycardia, unspecified: Secondary | ICD-10-CM | POA: Insufficient documentation

## 2017-07-30 DIAGNOSIS — F329 Major depressive disorder, single episode, unspecified: Secondary | ICD-10-CM | POA: Diagnosis present

## 2017-07-30 DIAGNOSIS — Z794 Long term (current) use of insulin: Secondary | ICD-10-CM | POA: Diagnosis not present

## 2017-07-30 DIAGNOSIS — F419 Anxiety disorder, unspecified: Secondary | ICD-10-CM | POA: Diagnosis present

## 2017-07-30 DIAGNOSIS — M7989 Other specified soft tissue disorders: Secondary | ICD-10-CM | POA: Diagnosis present

## 2017-07-30 DIAGNOSIS — E890 Postprocedural hypothyroidism: Secondary | ICD-10-CM | POA: Diagnosis present

## 2017-07-30 DIAGNOSIS — R600 Localized edema: Secondary | ICD-10-CM | POA: Insufficient documentation

## 2017-07-30 DIAGNOSIS — I5032 Chronic diastolic (congestive) heart failure: Secondary | ICD-10-CM | POA: Diagnosis present

## 2017-07-30 DIAGNOSIS — I82423 Acute embolism and thrombosis of iliac vein, bilateral: Secondary | ICD-10-CM | POA: Diagnosis present

## 2017-07-30 DIAGNOSIS — Z85850 Personal history of malignant neoplasm of thyroid: Secondary | ICD-10-CM | POA: Diagnosis not present

## 2017-07-30 DIAGNOSIS — I130 Hypertensive heart and chronic kidney disease with heart failure and stage 1 through stage 4 chronic kidney disease, or unspecified chronic kidney disease: Secondary | ICD-10-CM | POA: Diagnosis present

## 2017-07-30 DIAGNOSIS — E785 Hyperlipidemia, unspecified: Secondary | ICD-10-CM | POA: Diagnosis present

## 2017-07-30 DIAGNOSIS — Z8673 Personal history of transient ischemic attack (TIA), and cerebral infarction without residual deficits: Secondary | ICD-10-CM | POA: Diagnosis not present

## 2017-07-30 DIAGNOSIS — Z90411 Acquired partial absence of pancreas: Secondary | ICD-10-CM | POA: Diagnosis not present

## 2017-07-30 DIAGNOSIS — K219 Gastro-esophageal reflux disease without esophagitis: Secondary | ICD-10-CM | POA: Diagnosis present

## 2017-07-30 DIAGNOSIS — K3184 Gastroparesis: Secondary | ICD-10-CM | POA: Diagnosis present

## 2017-07-30 DIAGNOSIS — Z931 Gastrostomy status: Secondary | ICD-10-CM

## 2017-07-30 HISTORY — DX: Supraventricular tachycardia, unspecified: I47.10

## 2017-07-30 HISTORY — DX: Supraventricular tachycardia: I47.1

## 2017-07-30 LAB — PROTIME-INR
INR: 1.16
Prothrombin Time: 14.7 seconds (ref 11.4–15.2)

## 2017-07-30 LAB — CBC
HCT: 36.7 % (ref 36.0–46.0)
Hemoglobin: 11.8 g/dL — ABNORMAL LOW (ref 12.0–15.0)
MCH: 30.6 pg (ref 26.0–34.0)
MCHC: 32.2 g/dL (ref 30.0–36.0)
MCV: 95.3 fL (ref 78.0–100.0)
Platelets: 223 10*3/uL (ref 150–400)
RBC: 3.85 MIL/uL — ABNORMAL LOW (ref 3.87–5.11)
RDW: 15.8 % — ABNORMAL HIGH (ref 11.5–15.5)
WBC: 6.2 10*3/uL (ref 4.0–10.5)

## 2017-07-30 LAB — CBG MONITORING, ED: Glucose-Capillary: 265 mg/dL — ABNORMAL HIGH (ref 65–99)

## 2017-07-30 LAB — BASIC METABOLIC PANEL
Anion gap: 11 (ref 5–15)
BUN: 16 mg/dL (ref 6–20)
CO2: 27 mmol/L (ref 22–32)
Calcium: 8.4 mg/dL — ABNORMAL LOW (ref 8.9–10.3)
Chloride: 100 mmol/L — ABNORMAL LOW (ref 101–111)
Creatinine, Ser: 1.28 mg/dL — ABNORMAL HIGH (ref 0.44–1.00)
GFR calc Af Amer: 45 mL/min — ABNORMAL LOW (ref >60)
GFR calc non Af Amer: 39 mL/min — ABNORMAL LOW (ref >60)
Glucose, Bld: 226 mg/dL — ABNORMAL HIGH (ref 65–99)
Potassium: 2.9 mmol/L — ABNORMAL LOW (ref 3.5–5.1)
Sodium: 138 mmol/L (ref 135–145)

## 2017-07-30 LAB — POCT I-STAT CREATININE: Creatinine, Ser: 1.4 mg/dL — ABNORMAL HIGH (ref 0.44–1.00)

## 2017-07-30 MED ORDER — WARFARIN - PHARMACIST DOSING INPATIENT
Freq: Every day | Status: DC
  Administered 2017-07-31: 1
  Administered 2017-08-01 – 2017-08-05 (×3)

## 2017-07-30 MED ORDER — CARVEDILOL 25 MG PO TABS: 25.0000 mg | ORAL_TABLET | Freq: Two times a day (BID) | ORAL | 3 refills | Status: DC

## 2017-07-30 MED ORDER — POTASSIUM CHLORIDE CRYS ER 20 MEQ PO TBCR
20.0000 meq | EXTENDED_RELEASE_TABLET | Freq: Once | ORAL | Status: AC
  Administered 2017-07-31: 20 meq via ORAL
  Filled 2017-07-30: qty 1

## 2017-07-30 MED ORDER — IOPAMIDOL (ISOVUE-370) INJECTION 76%
INTRAVENOUS | Status: AC
  Administered 2017-07-30: 100 mL
  Filled 2017-07-30: qty 100

## 2017-07-30 MED ORDER — HEPARIN BOLUS VIA INFUSION
4500.0000 [IU] | Freq: Once | INTRAVENOUS | Status: AC
  Administered 2017-07-30: 4500 [IU] via INTRAVENOUS
  Filled 2017-07-30: qty 4500

## 2017-07-30 MED ORDER — HEPARIN (PORCINE) IN NACL 100-0.45 UNIT/ML-% IJ SOLN
900.0000 [IU]/h | INTRAMUSCULAR | Status: DC
  Administered 2017-07-30 – 2017-08-01 (×3): 1300 [IU]/h via INTRAVENOUS
  Administered 2017-08-02 – 2017-08-03 (×2): 1100 [IU]/h via INTRAVENOUS
  Filled 2017-07-30 (×7): qty 250

## 2017-07-30 MED ORDER — WARFARIN SODIUM 5 MG PO TABS
5.0000 mg | ORAL_TABLET | Freq: Once | ORAL | Status: AC
  Administered 2017-07-30: 5 mg via ORAL
  Filled 2017-07-30: qty 1

## 2017-07-30 MED ORDER — POTASSIUM CHLORIDE CRYS ER 20 MEQ PO TBCR
40.0000 meq | EXTENDED_RELEASE_TABLET | Freq: Once | ORAL | Status: AC
  Administered 2017-07-30: 40 meq via ORAL
  Filled 2017-07-30: qty 2

## 2017-07-30 MED ORDER — INSULIN ASPART 100 UNIT/ML ~~LOC~~ SOLN
0.0000 [IU] | Freq: Three times a day (TID) | SUBCUTANEOUS | Status: DC
  Administered 2017-07-31: 3 [IU] via SUBCUTANEOUS
  Administered 2017-07-31: 5 [IU] via SUBCUTANEOUS
  Administered 2017-07-31: 2 [IU] via SUBCUTANEOUS
  Administered 2017-08-01 (×2): 3 [IU] via SUBCUTANEOUS
  Administered 2017-08-01: 2 [IU] via SUBCUTANEOUS
  Administered 2017-08-02: 5 [IU] via SUBCUTANEOUS
  Administered 2017-08-02 – 2017-08-04 (×5): 2 [IU] via SUBCUTANEOUS
  Administered 2017-08-04: 3 [IU] via SUBCUTANEOUS
  Administered 2017-08-05 – 2017-08-06 (×3): 2 [IU] via SUBCUTANEOUS
  Administered 2017-08-06: 1 [IU] via SUBCUTANEOUS

## 2017-07-30 NOTE — Progress Notes (Signed)
ANTICOAGULATION CONSULT NOTE - Initial Consult  Pharmacy Consult for heparin and Coumadin Indication: DVT  Allergies  Allergen Reactions  . Hydralazine Hcl Shortness Of Breath    Pt had severe respiratory distress after receiving a dose  . Darvon [Propoxyphene Hcl] Other (See Comments)    Hallucinations   . Nyquil Multi-Symptom [Pseudoeph-Doxylamine-Dm-Apap] Other (See Comments)    Makes pt not "feel right in her head"  . Amlodipine Besylate Swelling  . Nifedipine Er Swelling  . Metformin And Related Diarrhea    Patient Measurements: Height: 5\' 4"  (162.6 cm) Weight: 206 lb (93.4 kg) IBW/kg (Calculated) : 54.7 Heparin Dosing Weight: 76 kg  Assessment: 81 yo F presents on 12/14 with CVT shown on doppler at Cardiology office. Pharmacy consulted to start heparin and Coumadin. Not on any anticoag PTA. CBC collected.  Goal of Therapy:  Heparin level 0.3-0.7 units/ml Monitor platelets by anticoagulation protocol: Yes   Plan:  Give heparin 4,500 unit bolus Start heparin gtt at 1,300 units/hr Give Coumadin 5mg  PO x 1 tonight Monitor daily heparin level / INR, CBC, s/s of bleed   Elenor Quinones, PharmD, [facility] Clinical Pharmacist Pager [phone] 07/30/2017 5:32 PM

## 2017-07-30 NOTE — ED Provider Notes (Signed)
The patient was seen primarily, by cardiology services in the emergency department.  She was not seen by ED provider staff.   Daleen Bo, MD 07/30/17 [phone]

## 2017-07-30 NOTE — ED Notes (Signed)
Can have food

## 2017-07-30 NOTE — H&P (Signed)
Addendum: See Dr. Theodosia Blender note below. 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 Developed subsequent gastroparesis requiring G tube, then GJ tube then surgical J tube. Is now taking POs per her report. Course complicated by DVT which was then complicated by rectus sheath hematoma requiring cessation of anticoagulation and IVC filter placement per surgery notes. Seen in clinic today for continued edema. Dr. Radford Pax sent over for vascular eval which revealed extensive DVT. CTA was neg for PE. Dr. Radford Pax recommended send to ED for cardiology admission. I was asked to assist with orders. I was unable to reach Dr. Radford Pax so discussed with attending cardiologist Dr. Percival Spanish who recommended initiation of heparin->Coumadin, hold metolazone, hold Plavix - given prior bleed, reversible agent preferred over NOAC. Given Cr 1.4 by istat can re-evaluate Lasix and ARB in AM since she just had IV contrast. Will also need updated CBC and full BMET. May need hematology input given prior Lovenox use and cancer. Patient denies acute change in status, just continued edema and tightness of her LE. No chest pain or dyspnea. She is mildly hypertensive but otherwise VSS. Also clarified full code status. Dayna Dunn PA-C   History and all data above reviewed.  Patient examined.  Seen earlier by Dr. Radford Pax.  DVT as above.   I agree with the findings as above.  The patient exam reveals COR:RRR  ,  Lungs: Clear  ,  Abd: Positive bowel sounds, no rebound no guarding, Ext Mild edema  .  All available labs, radiology testing, previous records reviewed. Agree with documented assessment and plan.   DVT:  Needs heparin.  High risk for anticoagulation but it is indicated in this situation as the risk without is higher.  I will suggest warfarin as it is reversable if there are any bleeding issues.    Minus Breeding  6:24 PM  07/30/2017   Cardiology Office Note:    Date:  07/30/2017    ID:  JANKI DIKE, DOB 1936-07-12, MRN [id number]  PCP:  Nolene Ebbs, MD     Cardiologist:  Fransico Him, MD   Referring MD: Nolene Ebbs, MD       Chief Complaint  Patient presents with  . Follow-up    SVT, HTN    History of Present Illness:    Kaylee Mullins is a 81 y.o. 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 She had a prolonged hospital course complicated by SVT and was seen by Cardiology and meds adjusted while she was NPO.  She is here for followup and from a cardiac standpoint is doing well.  She denies any further palpitations.  She denies any chest pain or pressure, PND, orthopnea.  She has chronic LE edema that she says has been occurring for about 4 weeks.  It gets better when elevating her legs.  She has had some recent SOB and DOE.        Past Medical History:  Diagnosis Date  . Acute CVA (cerebrovascular accident) ([place]) 09/16/2014  . Acute DVT of left tibial vein ([place]) 04/03/2017  . Adenocarcinoma ([place]) 01/12/2017  . 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 Anxiety   . Arthritis    knees  . Black tarry stools    05-14-16 negative for occult blood with ER visit- noted in [place].  . Cancer ([place])  thyroid cancer- surgery and radiation  . Cerebral infarction due to unspecified mechanism   . Chronic kidney disease    questionable mass on kidney. Being followed by Dr Diona Fanti  . Complication of anesthesia    heart rate was really low  . CVA (cerebral infarction) 09/11/2014  . Depression   . Diabetes mellitus    type 2  . Diabetes mellitus without complication ([place]) 08/20/863   Qualifier: Diagnosis of  By: Loanne Drilling MD, Jacelyn Pi   . Dyslipidemia 04/20/2007   Qualifier: Diagnosis of  By: Loanne Drilling MD, Jacelyn Pi   . Full dentures   . Gastroparesis 02/06/2017  . GERD (gastroesophageal reflux disease) 04/03/2017  . History  of CVA (cerebral vascular accident) ([place]) 09/11/2014  . Hypertension   . Hypothyroidism   . Lumbar stenosis with neurogenic claudication 02/10/2016  . Osteoarthritis 09/11/2014  . Pneumonia   . Right renal mass 09/13/2014  . Spinal stenosis   . Stroke ([place]) 09/2014   left sided weakness  . SVT (supraventricular tachycardia) ([place]) 07/30/2017         Past Surgical History:  Procedure Laterality Date  . ABDOMINAL HYSTERECTOMY     partial  . cataracts     Removed  11/2015  bilateral  . COLONOSCOPY W/ POLYPECTOMY    . ERCP N/A 05/07/2016   Procedure: ENDOSCOPIC RETROGRADE CHOLANGIOPANCREATOGRAPHY (ERCP);  Surgeon: Clarene Essex, MD;  Location: [facility] ENDOSCOPY;  Service: Endoscopy;  Laterality: N/A;  . ERCP N/A 10/16/2016   Procedure: ENDOSCOPIC RETROGRADE CHOLANGIOPANCREATOGRAPHY (ERCP);  Surgeon: Clarene Essex, MD;  Location: [facility] ENDOSCOPY;  Service: Endoscopy;  Laterality: N/A;  . ESOPHAGOGASTRODUODENOSCOPY N/A 02/10/2017   Procedure: ESOPHAGOGASTRODUODENOSCOPY (EGD);  Surgeon: Teena Irani, MD;  Location: [facility] ENDOSCOPY;  Service: Endoscopy;  Laterality: N/A;  . EUS N/A 05/27/2016   Procedure: ESOPHAGEAL ENDOSCOPIC ULTRASOUND (EUS) RADIAL;  Surgeon: Arta Silence, MD;  Location: [facility] ENDOSCOPY;  Service: Endoscopy;  Laterality: N/A;  . FLEXIBLE SIGMOIDOSCOPY  03/29/2012   Procedure: FLEXIBLE SIGMOIDOSCOPY;  Surgeon: Jeryl Columbia, MD;  Location: [facility] ENDOSCOPY;  Service: Endoscopy;  Laterality: N/A;  fleet enema upon arrival  . HOT HEMOSTASIS  03/29/2012   Procedure: HOT HEMOSTASIS (ARGON PLASMA COAGULATION/BICAP);  Surgeon: Jeryl Columbia, MD;  Location: [facility] ENDOSCOPY;  Service: Endoscopy;  Laterality: N/A;  . IR GASTR TUBE CONVERT GASTR-JEJ PER W/FL MOD SED  02/23/2017  . IR GASTROSTOMY TUBE MOD SED  02/16/2017  . IR GENERIC HISTORICAL  06/30/2016   IR RADIOLOGIST EVAL & MGMT 06/30/2016 Aletta Edouard, MD [facility] INTERV RAD  . IR GENERIC HISTORICAL  09/09/2016   IR RADIOLOGIST EVAL &  MGMT 09/09/2016 Aletta Edouard, MD [facility] INTERV RAD  . IR IVC FILTER PLMT / S&I /IMG GUID/MOD SED  03/03/2017  . IR PATIENT EVAL TECH 0-60 MINS  05/12/2017  . LAPAROSCOPY N/A 01/12/2017   Procedure: LAPAROSCOPY DIAGNOSTIC;  Surgeon: Stark Klein, MD;  Location: [place];  Service: General;  Laterality: N/A;  . LAPAROTOMY N/A 03/10/2017   Procedure: EXPLORATORY LAPAROTOMY Open jejunostomy tube;  Surgeon: Stark Klein, MD;  Location: [facility];  Service: General;  Laterality: N/A;  . LUMBAR LAMINECTOMY/DECOMPRESSION MICRODISCECTOMY N/A 02/10/2016   Procedure: Lumbar three- four Laminectomy;  Surgeon: Kristeen Miss, MD;  Location: [facility] NEURO ORS;  Service: Neurosurgery;  Laterality: N/A;  L3-4 Laminectomy  . LUMBAR SPINE SURGERY     1st surgery "ray cage placed"  . THYROIDECTOMY  2005  . TONSILLECTOMY    . WHIPPLE PROCEDURE N/A 01/12/2017   Procedure: WHIPPLE PROCEDURE;  Surgeon: Stark Klein, MD;  Location: [place] [facility];  Service: General;  Laterality: N/A;    Current Medications: ActiveMedications  Current Meds  Medication Sig  . B-D UF III MINI PEN NEEDLES 31G X 5 MM MISC USE AS DIRECTED WITH HUMALOG  . clopidogrel (PLAVIX) 75 MG tablet Take 75 mg daily by mouth.  . furosemide (LASIX) 40 MG tablet Take 40 mg by mouth daily as needed (leg swelling).  Marland Kitchen HUMALOG KWIKPEN 100 UNIT/ML KiwkPen USE SLIDING SCALE EVERY 4 HOURS BASED ON BLOOD SUGAR READINGS, SLIDING SCALE INCLUDED  . irbesartan (AVAPRO) 300 MG tablet Take 1 tablet (300 mg total) by mouth daily.  Marland Kitchen levothyroxine (SYNTHROID, LEVOTHROID) 200 MCG tablet Take 200 mcg by mouth daily before breakfast. For hypothyroidism  . metolazone (ZAROXOLYN) 2.5 MG tablet TAKE 1 TABLET BY MOUTH TWICE A DAY ABOUT 30 MINUTES BEFORE FUROSEMIDE  . metoprolol succinate (TOPROL-XL) 100 MG 24 hr tablet Take 100 mg by mouth daily. Take with or immediately following a meal.   . NIFEdipine (PROCARDIA-XL/ADALAT-CC/NIFEDICAL-XL) 30 MG 24 hr tablet Take 30 mg daily by  mouth.  . pantoprazole (PROTONIX) 40 MG tablet Take 1 tablet (40 mg total) by mouth at bedtime. (Patient taking differently: Take 40 mg by mouth 2 (two) times daily. )       Allergies:   Hydralazine hcl; Darvon [propoxyphene hcl]; Nyquil multi-symptom [pseudoeph-doxylamine-dm-apap]; and Metformin and related   Social History        Socioeconomic History  . Marital status: Widowed    Spouse name: None  . Number of children: None  . Years of education: None  . Highest education level: None  Social Needs  . Financial resource strain: None  . Food insecurity - worry: None  . Food insecurity - inability: None  . Transportation needs - medical: None  . Transportation needs - non-medical: None  Occupational History  . Occupation: housewife    Comment: college housekeeping  Tobacco Use  . Smoking status: Never Smoker  . Smokeless tobacco: Never Used  Substance and Sexual Activity  . Alcohol use: No  . Drug use: No  . Sexual activity: No  Other Topics Concern  . None  Social History Narrative   Widowed,  retired, 3 children living, 2 children deceased   Caffeine use - soda every few days   Right handed   Pt lives alone.  Using cane when out and about.    Admitted to [facility] 03/29/17   Never smoked   Alcohol none    Full Code     Family History: The patient's family history includes Heart attack in her father; Heart failure in her mother.  ROS:   Please see the history of present illness.    ROS  All other systems reviewed and negative.   EKGs/Labs/Other Studies Reviewed:    The following studies were reviewed today: Hospital notes  EKG:  EKG is ordered today and showed NSR with sinus arrythmia and nonspecific ST abnormality  Recent Labs: 03/29/2017: Magnesium 1.9 04/20/2017: Hemoglobin 12.4; Platelets 265 04/27/2017: ALT 16; BUN 26; Creatinine 0.9; Potassium 4.0; Sodium 138   Recent Lipid Panel Labs(Brief)            Component Value Date/Time   CHOL 120 04/09/2017   TRIG 137 04/09/2017   HDL 35 04/09/2017   CHOLHDL 3.0 09/13/2014 0910   VLDL 26 09/13/2014 0910   LDLCALC 58 04/09/2017      Physical Exam:    VS:  BP (!) 164/92   Pulse 65   Ht 5\' 4"  (1.626 m)   Wt 206 lb 9.6 oz (93.7 kg)   BMI 35.46 kg/m        Wt Readings from Last 3 Encounters:  07/30/17 206 lb 9.6 oz (93.7 kg)  06/23/17 207 lb 3.2 oz (94 kg)  04/27/17 193 lb (87.5 kg)     GEN:  Well nourished, well developed in no acute distress HEENT: Normal NECK: No JVD; No carotid bruits LYMPHATICS: No lymphadenopathy CARDIAC: RRR, no murmurs, rubs, gallops RESPIRATORY:  Clear to auscultation without rales, wheezing or rhonchi  ABDOMEN: Soft, non-tender, non-distended MUSCULOSKELETAL:  No edema; No deformity  SKIN: Warm and dry NEUROLOGIC:  Alert and oriented x 3 PSYCHIATRIC:  Normal affect   ASSESSMENT:    1. SVT (supraventricular tachycardia) ([place])   2. Essential hypertension   3. Edema extremities    PLAN:    In order of problems listed above:  1.  SVT - this was in the setting of acute illness.  She has not had any reoccurrence of palpitations.  2.  HTN - Bp is elevated on exam today. She will continue on Avapro 300mg  daily  She has had LE edema with amlodipine and nifedipine and got SOB with Hydralazine.  I am going to stop her Toprol and change to carvedilol 25mg  BID and follwoup with HTN clinic in 1 week  3.  LE edema - her nifedipine has been stopped with no significant change in edema.  She was started on Lasix 80mg  daily and Metolazone which have helped some but she still has significant edema. With history of DVT and underlying adeonCA she is at risk for repeat DVT so I will get LE venous dopplers to rule out DVT.  Given her recent SOB I am also getting a chest CT angio to rule out PE.  2D echo was normal in 06/2017.  Continue diuretics - ? Whether her abdominal surgery and history of CA are  playing any role.  Medication Adjustments/Labs and Tests Ordered: Current medicines are reviewed at length with the patient today.  Concerns regarding medicines are outlined above.  No orders of the defined types were placed in this encounter.  No orders of the defined types were placed in this encounter.   Signed, Fransico Him, MD  07/30/2017 11:21 AM    [place]

## 2017-07-30 NOTE — Progress Notes (Signed)
Cardiology Office Note:    Date:  07/30/2017   ID:  Kaylee Mullins, DOB [birth year], MRN [id number]  PCP:  Nolene Ebbs, MD  Cardiologist:  Fransico Him, MD   Referring MD: Nolene Ebbs, MD   Chief Complaint  Patient presents with  . Follow-up    SVT, HTN    History of Present Illness:    Kaylee Mullins is a 81 y.o. 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 She had a prolonged hospital course complicated by SVT and was seen by Cardiology and meds adjusted while she was NPO.  She is here for followup and from a cardiac standpoint is doing well.  She denies any further palpitations.  She denies any chest pain or pressure, PND, orthopnea.  She has chronic LE edema that she says has been occurring for about 4 weeks.  It gets better when elevating her legs.  She has had some recent SOB and DOE.    Past Medical History:  Diagnosis Date  . Acute CVA (cerebrovascular accident) ([place]) 09/16/2014  . Acute DVT of left tibial vein ([place]) 04/03/2017  . Adenocarcinoma ([place]) 01/12/2017  . 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 Anxiety   . Arthritis    knees  . Black tarry stools    05-14-16 negative for occult blood with ER visit- noted in [place].  . Cancer [facility])    thyroid cancer- surgery and radiation  . Cerebral infarction due to unspecified mechanism   . Chronic kidney disease    questionable mass on kidney. Being followed by Dr Diona Fanti  . Complication of anesthesia    heart rate was really low  . CVA (cerebral infarction) 09/11/2014  . Depression   . Diabetes mellitus    type 2  . Diabetes mellitus without complication ([place]) 04/19/7168   Qualifier: Diagnosis of  By: Loanne Drilling MD, Jacelyn Pi   . Dyslipidemia 04/20/2007   Qualifier: Diagnosis of  By: Loanne Drilling MD, Jacelyn Pi   . Full dentures   . Gastroparesis 02/06/2017  . GERD (gastroesophageal reflux disease) 04/03/2017  .  History of CVA (cerebral vascular accident) ([place]) 09/11/2014  . Hypertension   . Hypothyroidism   . Lumbar stenosis with neurogenic claudication 02/10/2016  . Osteoarthritis 09/11/2014  . Pneumonia   . Right renal mass 09/13/2014  . Spinal stenosis   . Stroke ([place]) 09/2014   left sided weakness  . SVT (supraventricular tachycardia) ([place]) 07/30/2017    Past Surgical History:  Procedure Laterality Date  . ABDOMINAL HYSTERECTOMY     partial  . cataracts     Removed  11/2015  bilateral  . COLONOSCOPY W/ POLYPECTOMY    . ERCP N/A 05/07/2016   Procedure: ENDOSCOPIC RETROGRADE CHOLANGIOPANCREATOGRAPHY (ERCP);  Surgeon: Clarene Essex, MD;  Location: [facility] ENDOSCOPY;  Service: Endoscopy;  Laterality: N/A;  . ERCP N/A 10/16/2016   Procedure: ENDOSCOPIC RETROGRADE CHOLANGIOPANCREATOGRAPHY (ERCP);  Surgeon: Clarene Essex, MD;  Location[facility] ENDOSCOPY;  Service: Endoscopy;  Laterality: N/A;  . ESOPHAGOGASTRODUODENOSCOPY N/A 02/10/2017   Procedure: ESOPHAGOGASTRODUODENOSCOPY (EGD);  Surgeon: Teena Irani, MD;  Location: [facility] ENDOSCOPY;  Service: Endoscopy;  Laterality: N/A;  . EUS N/A 05/27/2016   Procedure: ESOPHAGEAL ENDOSCOPIC ULTRASOUND (EUS) RADIAL;  Surgeon: Arta Silence, MD;  Location[facility] ENDOSCOPY;  Service: Endoscopy;  Laterality: N/A;  . FLEXIBLE SIGMOIDOSCOPY  03/29/2012   Procedure: FLEXIBLE SIGMOIDOSCOPY;  Surgeon: Jeryl Columbia, MD;  Location: [facility] ENDOSCOPY;  Service: Endoscopy;  Laterality: N/A;  fleet enema upon arrival  . HOT HEMOSTASIS  03/29/2012   Procedure: HOT HEMOSTASIS (ARGON PLASMA COAGULATION/BICAP);  Surgeon: Jeryl Columbia, MD;  Location: [facility] ENDOSCOPY;  Service: Endoscopy;  Laterality: N/A;  . IR GASTR TUBE CONVERT GASTR-JEJ PER W/FL MOD SED  02/23/2017  . IR GASTROSTOMY TUBE MOD SED  02/16/2017  . IR GENERIC HISTORICAL  06/30/2016   IR RADIOLOGIST EVAL & MGMT 06/30/2016 Aletta Edouard, MD [facility] INTERV RAD  . IR GENERIC HISTORICAL  09/09/2016   IR RADIOLOGIST EVAL & MGMT 09/09/2016 Aletta Edouard, MD [facility] INTERV RAD  . IR IVC FILTER PLMT / S&I /IMG GUID/MOD SED  03/03/2017  . IR PATIENT EVAL TECH 0-60 MINS  05/12/2017  . LAPAROSCOPY N/A 01/12/2017   Procedure: LAPAROSCOPY DIAGNOSTIC;  Surgeon: Stark Klein, MD;  Location: [facility];  Service: General;  Laterality: N/A;  . LAPAROTOMY N/A 03/10/2017   Procedure: EXPLORATORY LAPAROTOMY Open jejunostomy tube;  Surgeon: Stark Klein, MD;  Location: [place];  Service: General;  Laterality: N/A;  . LUMBAR LAMINECTOMY/DECOMPRESSION MICRODISCECTOMY N/A 02/10/2016   Procedure: Lumbar three- four Laminectomy;  Surgeon: Kristeen Miss, MD;  Location: [place] NEURO ORS;  Service: Neurosurgery;  Laterality: N/A;  L3-4 Laminectomy  . LUMBAR SPINE SURGERY     1st surgery "ray cage placed"  . THYROIDECTOMY  2005  . TONSILLECTOMY    . WHIPPLE PROCEDURE N/A 01/12/2017   Procedure: WHIPPLE PROCEDURE;  Surgeon: Stark Klein, MD;  Location: [place];  Service: General;  Laterality: N/A;    Current Medications: Current Meds  Medication Sig  . B-D UF III MINI PEN NEEDLES 31G X 5 MM MISC USE AS DIRECTED WITH HUMALOG  . clopidogrel (PLAVIX) 75 MG tablet Take 75 mg daily by mouth.  . furosemide (LASIX) 40 MG tablet Take 40 mg by mouth daily as needed (leg swelling).  Marland Kitchen HUMALOG KWIKPEN 100 UNIT/ML KiwkPen USE SLIDING SCALE EVERY 4 HOURS BASED ON BLOOD SUGAR READINGS, SLIDING SCALE INCLUDED  . irbesartan (AVAPRO) 300 MG tablet Take 1 tablet (300 mg total) by mouth daily.  Marland Kitchen levothyroxine (SYNTHROID, LEVOTHROID) 200 MCG tablet Take 200 mcg by mouth daily before breakfast. For hypothyroidism  . metolazone (ZAROXOLYN) 2.5 MG tablet TAKE 1 TABLET BY MOUTH TWICE A DAY ABOUT 30 MINUTES BEFORE FUROSEMIDE  . metoprolol succinate (TOPROL-XL) 100 MG 24 hr tablet Take 100 mg by mouth daily. Take with or immediately following a meal.   . NIFEdipine (PROCARDIA-XL/ADALAT-CC/NIFEDICAL-XL) 30 MG 24 hr tablet Take 30 mg daily by mouth.  . pantoprazole (PROTONIX) 40 MG tablet Take 1  tablet (40 mg total) by mouth at bedtime. (Patient taking differently: Take 40 mg by mouth 2 (two) times daily. )     Allergies:   Hydralazine hcl; Darvon [propoxyphene hcl]; Nyquil multi-symptom [pseudoeph-doxylamine-dm-apap]; and Metformin and related   Social History   Socioeconomic History  . Marital status: Widowed    Spouse name: None  . Number of children: None  . Years of education: None  . Highest education level: None  Social Needs  . Financial resource strain: None  . Food insecurity - worry: None  . Food insecurity - inability: None  . Transportation needs - medical: None  . Transportation needs - non-medical: None  Occupational History  . Occupation: housewife    Comment: college housekeeping  Tobacco Use  . Smoking status: Never Smoker  . Smokeless tobacco: Never Used  Substance and Sexual Activity  . Alcohol  use: No  . Drug use: No  . Sexual activity: No  Other Topics Concern  . None  Social History Narrative   Widowed,  retired, 3 children living, 2 children deceased   Caffeine use - soda every few days   Right handed   Pt lives alone.  Using cane when out and about.    Admitted to [facility] 03/29/17   Never smoked   Alcohol none    Full Code     Family History: The patient's family history includes Heart attack in her father; Heart failure in her mother.  ROS:   Please see the history of present illness.    ROS  All other systems reviewed and negative.   EKGs/Labs/Other Studies Reviewed:    The following studies were reviewed today: Hospital notes  EKG:  EKG is ordered today and showed NSR with sinus arrythmia and nonspecific ST abnormality  Recent Labs: 03/29/2017: Magnesium 1.9 04/20/2017: Hemoglobin 12.4; Platelets 265 04/27/2017: ALT 16; BUN 26; Creatinine 0.9; Potassium 4.0; Sodium 138   Recent Lipid Panel    Component Value Date/Time   CHOL 120 04/09/2017   TRIG 137 04/09/2017   HDL 35 04/09/2017   CHOLHDL 3.0  09/13/2014 0910   VLDL 26 09/13/2014 0910   LDLCALC 58 04/09/2017    Physical Exam:    VS:  BP (!) 164/92   Pulse 65   Ht 5\' 4"  (1.626 m)   Wt 206 lb 9.6 oz (93.7 kg)   BMI 35.46 kg/m     Wt Readings from Last 3 Encounters:  07/30/17 206 lb 9.6 oz (93.7 kg)  06/23/17 207 lb 3.2 oz (94 kg)  04/27/17 193 lb (87.5 kg)     GEN:  Well nourished, well developed in no acute distress HEENT: Normal NECK: No JVD; No carotid bruits LYMPHATICS: No lymphadenopathy CARDIAC: RRR, no murmurs, rubs, gallops RESPIRATORY:  Clear to auscultation without rales, wheezing or rhonchi  ABDOMEN: Soft, non-tender, non-distended MUSCULOSKELETAL:  No edema; No deformity  SKIN: Warm and dry NEUROLOGIC:  Alert and oriented x 3 PSYCHIATRIC:  Normal affect   ASSESSMENT:    1. SVT (supraventricular tachycardia) ([place])   2. Essential hypertension   3. Edema extremities    PLAN:    In order of problems listed above:  1.  SVT - this was in the setting of acute illness.  She has not had any reoccurrence of palpitations.  2.  HTN - Bp is elevated on exam today. She will continue on Avapro 300mg  daily  She has had LE edema with amlodipine and nifedipine and got SOB with Hydralazine.  I am going to stop her Toprol and change to carvedilol 25mg  BID and follwoup with HTN clinic in 1 week  3.  LE edema - her nifedipine has been stopped with no significant change in edema.  She was started on Lasix 80mg  daily and Metolazone which have helped some but she still has significant edema. With history of DVT and underlying adeonCA she is at risk for repeat DVT so I will get LE venous dopplers to rule out DVT.  Given her recent SOB I am also getting a chest CT angio to rule out PE.  2D echo was normal in 06/2017.  Continue diuretics - ? Whether her abdominal surgery and history of CA are playing any role.  Medication Adjustments/Labs and Tests Ordered: Current medicines are reviewed at length with the patient today.   Concerns regarding medicines are outlined  above.  No orders of the defined types were placed in this encounter.  No orders of the defined types were placed in this encounter.   Signed, Fransico Him, MD  07/30/2017 11:21 AM    [place]

## 2017-07-30 NOTE — Progress Notes (Signed)
*  PRELIMINARY RESULTS* Vascular Ultrasound Bilateral lower extremity venous duplex has been completed.  Preliminary findings: Extensive non-occlusive deep vein thrombosis noted bilaterally in visualized veins from the groin to the ankle.  Bilateral iliac veins appear patent, attempt was made to visualize IVC- unable to visualize.  Preliminary results called to Dr. Tonna Corner to [facility] @ 15:40.  Patient advised to go to emergency department immediately after CT.  Called CT, spoke with [facility] @ 15:52, patient needs to go to ED immediately after exam.  Everrett Coombe 07/30/2017, 3:51 PM

## 2017-07-30 NOTE — ED Notes (Signed)
Per Vascular tech pt has outpatient scan with results showing clots in legs, pt also had chest study completed but scan

## 2017-07-30 NOTE — Telephone Encounter (Signed)
Lyndee Leo in Vascular at [facility] is calling to report to Dr Radford Pax pts STAT LE Venous Duplex results from today.  Per [facility] Vascular Tech, the pt has extensive bilateral DVTs.  Per Lyndee Leo the pt is still in the hospital in their lab.  Informed Lyndee Leo that I made Dr Radford Pax aware of abnormal venous duplex, and per Dr Radford Pax, please instruct the vascular lab to take the pt to the ER now for bilateral DVT work-up. Informed Lyndee Leo of this and she will be taking to the ER now.

## 2017-07-30 NOTE — Patient Instructions (Addendum)
Medication Instructions:  Your physician has recommended you make the following change in your medication:   1. STOP: Toprol  2. START: Carvedilol 25 mg twice daily  Labwork: TODAY: BMET  Testing/Procedures: Your physician wants you to have a CT Angio of your chest done today for shortness of breath to rule out pulmonary emolism.  Your physician has requested that you have a lower extremity venous duplex done today. This test is an ultrasound of the veins in the legs or arms. It looks at venous blood flow that carries blood from the heart to the legs or arms. Allow one hour for a Lower Venous exam. Allow thirty minutes for an Upper Venous exam. There are no restrictions or special instructions.  Follow-Up: Your physician recommends that you schedule a follow-up appointment in: 1 week with the [facility].   Your physician wants you to follow-up in: 6 months with Dr. Harrington Challenger. You will receive a reminder letter in the mail two months in advance. If you don't receive a letter, please call our office to schedule the follow-up appointment.   Any Other Special Instructions Will Be Listed Below (If Applicable).     If you need a refill on your cardiac medications before your next appointment, please call your pharmacy.

## 2017-07-31 ENCOUNTER — Other Ambulatory Visit: Payer: Self-pay

## 2017-07-31 DIAGNOSIS — I471 Supraventricular tachycardia: Secondary | ICD-10-CM

## 2017-07-31 DIAGNOSIS — I82403 Acute embolism and thrombosis of unspecified deep veins of lower extremity, bilateral: Secondary | ICD-10-CM

## 2017-07-31 LAB — CBC
HCT: 33.9 % — ABNORMAL LOW (ref 36.0–46.0)
Hemoglobin: 11.2 g/dL — ABNORMAL LOW (ref 12.0–15.0)
MCH: 31.5 pg (ref 26.0–34.0)
MCHC: 33 g/dL (ref 30.0–36.0)
MCV: 95.5 fL (ref 78.0–100.0)
Platelets: 251 10*3/uL (ref 150–400)
RBC: 3.55 MIL/uL — ABNORMAL LOW (ref 3.87–5.11)
RDW: 16 % — ABNORMAL HIGH (ref 11.5–15.5)
WBC: 5.7 10*3/uL (ref 4.0–10.5)

## 2017-07-31 LAB — PROTIME-INR
INR: 1.27
Prothrombin Time: 15.8 seconds — ABNORMAL HIGH (ref 11.4–15.2)

## 2017-07-31 LAB — HEPARIN LEVEL (UNFRACTIONATED): Heparin Unfractionated: 0.66 IU/mL (ref 0.30–0.70)

## 2017-07-31 MED ORDER — LEVOTHYROXINE SODIUM 100 MCG PO TABS
200.0000 ug | ORAL_TABLET | Freq: Every day | ORAL | Status: DC
  Administered 2017-07-31 – 2017-08-06 (×7): 200 ug via ORAL
  Filled 2017-07-31 (×7): qty 2

## 2017-07-31 MED ORDER — ONDANSETRON HCL 4 MG/2ML IJ SOLN: 4.0000 mg | Freq: Four times a day (QID) | INTRAMUSCULAR | Status: DC | PRN

## 2017-07-31 MED ORDER — WARFARIN SODIUM 5 MG PO TABS
5.0000 mg | ORAL_TABLET | Freq: Once | ORAL | Status: AC
  Administered 2017-07-31: 5 mg via ORAL
  Filled 2017-07-31: qty 1

## 2017-07-31 MED ORDER — SODIUM CHLORIDE 0.9% FLUSH
3.0000 mL | Freq: Two times a day (BID) | INTRAVENOUS | Status: DC
  Administered 2017-07-31 – 2017-08-02 (×2): 3 mL via INTRAVENOUS

## 2017-07-31 MED ORDER — CARVEDILOL 25 MG PO TABS
25.0000 mg | ORAL_TABLET | Freq: Two times a day (BID) | ORAL | Status: DC
  Administered 2017-07-31 – 2017-08-06 (×13): 25 mg via ORAL
  Filled 2017-07-31 (×13): qty 1

## 2017-07-31 MED ORDER — PANTOPRAZOLE SODIUM 40 MG PO TBEC
40.0000 mg | DELAYED_RELEASE_TABLET | Freq: Two times a day (BID) | ORAL | Status: DC
  Administered 2017-07-31 – 2017-08-06 (×13): 40 mg via ORAL
  Filled 2017-07-31 (×13): qty 1

## 2017-07-31 MED ORDER — SODIUM CHLORIDE 0.9% FLUSH: 3.0000 mL | INTRAVENOUS | Status: DC | PRN

## 2017-07-31 MED ORDER — SODIUM CHLORIDE 0.9 % IV SOLN: 250.0000 mL | INTRAVENOUS | Status: DC | PRN

## 2017-07-31 NOTE — Plan of Care (Signed)
  Progressing Education: Understanding of cardiac disease, CV risk reduction, and recovery process will improve 07/31/2017 2335 - Progressing by Blair Promise, RN Activity: Ability to tolerate increased activity will improve 07/31/2017 2335 - Progressing by Blair Promise, RN Cardiac: Ability to achieve and maintain adequate cardiovascular perfusion will improve 07/31/2017 2335 - Progressing by Blair Promise, RN Health Behavior/Discharge Planning: Ability to safely manage health-related needs after discharge will improve 07/31/2017 2335 - Progressing by Blair Promise, RN

## 2017-07-31 NOTE — Plan of Care (Signed)
  Progressing Cardiac: Ability to achieve and maintain adequate cardiovascular perfusion will improve 07/31/2017 0108 - Progressing by Blair Promise, RN

## 2017-07-31 NOTE — Progress Notes (Signed)
[facility] for heparin and Coumadin Indication: DVT  Allergies  Allergen Reactions  . Hydralazine Hcl Shortness Of Breath    Pt had severe respiratory distress after receiving a dose  . Darvon [Propoxyphene Hcl] Other (See Comments)    Hallucinations   . Nyquil Multi-Symptom [Pseudoeph-Doxylamine-Dm-Apap] Other (See Comments)    Makes pt not "feel right in her head"  . Amlodipine Besylate Swelling  . Nifedipine Er Swelling  . Metformin And Related Diarrhea    Patient Measurements: Height: 5\' 4"  (162.6 cm) Weight: 206 lb (93.4 kg) IBW/kg (Calculated) : 54.7 Heparin Dosing Weight: 76 kg  Assessment: 81 yo F presents on 12/14 with DVT (history of DVT earlier this year but anticoagulation was stopped due to rectus sheath hematoma; IVC placed) Not on any anticoag PTA. Currently on IV heparin at 1300 units/hr. Initial heparin level is therapeutic at 0.66. RN reports no s/s of bleeding    Goal of Therapy:  Heparin level 0.3-0.7 units/ml Monitor platelets by anticoagulation protocol: Yes   Plan:  Continue heparin gtt at 1,300 units/hr F/u 8 hr HL  Monitor daily heparin level / INR, CBC, s/s of bleed   Albertina Parr, PharmD., BCPS Clinical Pharmacist Pager [phone]

## 2017-07-31 NOTE — Progress Notes (Signed)
[facility] for heparin and Coumadin Indication: DVT  Allergies  Allergen Reactions  . Hydralazine Hcl Shortness Of Breath    Pt had severe respiratory distress after receiving a dose  . Darvon [Propoxyphene Hcl] Other (See Comments)    Hallucinations   . Nyquil Multi-Symptom [Pseudoeph-Doxylamine-Dm-Apap] Other (See Comments)    Makes pt not "feel right in her head"  . Amlodipine Besylate Swelling  . Nifedipine Er Swelling  . Metformin And Related Diarrhea    Patient Measurements: Height: 5\' 4"  (162.6 cm) Weight: 206 lb (93.4 kg) IBW/kg (Calculated) : 54.7 Heparin Dosing Weight: 76 kg  Assessment: 81 yo F presents on 12/14 with DVT (history of DVT earlier this year but anticoagulation was stopped due to rectus sheath hematoma; IVC placed) Not on any anticoag PTA. Pharmacy consulted to dose heparin and coumadin (day 2 of bridge). Patient is also noted with history of cancer.  -INR= 1.27   Goal of Therapy:  Heparin level 0.3-0.7 units/ml Monitor platelets by anticoagulation protocol: Yes   Plan:  Give heparin 4,500 unit bolus Start heparin gtt at 1,300 units/hr Give Coumadin 5mg  PO x 1 tonight Monitor daily heparin level / INR, CBC, s/s of bleed   Elenor Quinones, PharmD, BCPS Clinical Pharmacist Pager [phone] 07/31/2017 12:15 PM

## 2017-07-31 NOTE — Progress Notes (Signed)
Progress Note  Patient Name: Kaylee Mullins Date of Encounter: 07/31/2017  Primary Cardiologist: Dr. Fransico Him  Subjective   No chest pain or shortness of breath. Continues to have bilateral leg edema from thigh to feet. Feels somewhat less tense.  Inpatient Medications    Scheduled Meds: . carvedilol  25 mg Oral BID  . insulin aspart  0-9 Units Subcutaneous TID WC  . levothyroxine  200 mcg Oral QAC breakfast  . pantoprazole  40 mg Oral BID  . sodium chloride flush  3 mL Intravenous Q12H  . warfarin  5 mg Oral ONCE-1800  . Warfarin - Pharmacist Dosing Inpatient   Does not apply q1800   Continuous Infusions: . sodium chloride    . heparin 1,300 Units/hr (07/30/17 1845)   PRN Meds: sodium chloride, ondansetron (ZOFRAN) IV, sodium chloride flush   Vital Signs    Vitals:   07/31/17 0546 07/31/17 0743 07/31/17 0825 07/31/17 1200  BP: (!) 158/68  (!) 149/79 (!) 144/73  Pulse: 64 (!) 144 68 62  Resp: 18 18 20 19   Temp: 98.4 F (36.9 C)  98.7 F (37.1 C) 99 F (37.2 C)  TempSrc: Oral  Oral Oral  SpO2: 99% 100% 99% 100%  Weight:      Height:        Intake/Output Summary (Last 24 hours) at 07/31/2017 1221 Last data filed at 07/31/2017 1200 Gross per 24 hour  Intake 240 ml  Output -  Net 240 ml   Filed Weights   07/30/17 1710  Weight: 206 lb (93.4 kg)    Telemetry    Sinus rhythm. Personally reviewed.  ECG    Tracing from 07/30/2017 shows sinus rhythm with LVH and nonspecific T-wave abnormalities. Personally reviewed.  Physical Exam   GEN:  Obese woman. No acute distress.   Neck: No JVD. Cardiac: RRR, no murmur, rub, or gallop.  Respiratory: Nonlabored. Clear to auscultation bilaterally. GI: Soft, nontender, bowel sounds present. MS:  Significant bilateral lower leg edema and lymphedema. Neuro:  Nonfocal. Psych: Alert and oriented x 3. Normal affect.  Labs    Chemistry Recent Labs  Lab 07/30/17 1556 07/30/17 1759  NA  --  138  K  --   2.9*  CL  --  100*  CO2  --  27  GLUCOSE  --  226*  BUN  --  16  CREATININE 1.40* 1.28*  CALCIUM  --  8.4*  GFRNONAA  --  39*  GFRAA  --  45*  ANIONGAP  --  11     Hematology Recent Labs  Lab 07/30/17 1759 07/31/17 0251  WBC 6.2 5.7  RBC 3.85* 3.55*  HGB 11.8* 11.2*  HCT 36.7 33.9*  MCV 95.3 95.5  MCH 30.6 31.5  MCHC 32.2 33.0  RDW 15.8* 16.0*  PLT 223 251    Radiology    Ct Angio Chest Pe W Or Wo Contrast  Result Date: 07/30/2017 CLINICAL DATA:  Dyspnea. 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 EXAM: CT ANGIOGRAPHY CHEST WITH CONTRAST TECHNIQUE: Multidetector CT imaging of the chest was performed using the standard protocol during bolus administration of intravenous contrast. Multiplanar CT image reconstructions and MIPs were obtained to evaluate the vascular anatomy. CONTRAST:  144mL ISOVUE-370 IOPAMIDOL (ISOVUE-370) INJECTION 76% COMPARISON:  11/25/2016 chest CT.  01/15/2017 chest radiograph. FINDINGS: Cardiovascular: The study is high quality for the evaluation of pulmonary embolism. There are no filling defects in the central, lobar, segmental or subsegmental pulmonary artery branches to suggest  acute pulmonary embolism. Atherosclerotic nonaneurysmal thoracic aorta. Normal caliber pulmonary arteries (main pulmonary artery diameter 2.9 cm). Stable mild cardiomegaly. No significant pericardial fluid/thickening. Three-vessel coronary atherosclerosis. Mediastinum/Nodes: Total thyroidectomy. Unremarkable esophagus. No pathologically enlarged axillary, mediastinal or hilar lymph nodes. Lungs/Pleura: No pneumothorax. No pleural effusion. Right middle lobe 3 mm solid pulmonary nodule (series 7/ image 44), stable since 11/25/2016 chest CT. Anterior left lower lobe 1.2 cm solid pulmonary nodule (series 7/ image 57), stable since 11/25/2016 chest CT. Mild scarring versus atelectasis at the left lung base. No acute consolidative airspace disease, lung masses or new  significant pulmonary nodules. Upper abdomen: No acute abnormality. Partially visualized main pancreatic duct stent in the remnant pancreatic body. Musculoskeletal: No aggressive appearing focal osseous lesions. Heterogeneous sclerotic appearance of the thoracic vertebral bodies, not appreciably changed, with marked thoracic spondylosis. Review of the MIP images confirms the above findings. IMPRESSION: 1. No pulmonary embolism.  No acute pulmonary disease. 2. No definite evidence of metastatic disease in the chest. Two pulmonary nodules are stable since 11/25/2016, suggesting benign nodules. Recommend continued chest CT surveillance in 6 months. 3. Stable mild cardiomegaly.  Three-vessel coronary atherosclerosis. Aortic Atherosclerosis (ICD10-I70.0). These results will be called to the ordering clinician or representative by the Radiology Department at the imaging location. Electronically Signed   By: Ilona Sorrel M.D.   On: 07/30/2017 16:47    Cardiac Studies   Echocardiogram 06/28/2017: Study Conclusions  - Left ventricle: The cavity size was normal. There was mild   concentric hypertrophy. Systolic function was normal. The   estimated ejection fraction was in the range of 60% to 65%. Wall   motion was normal; there were no regional wall motion   abnormalities. Doppler parameters are consistent with abnormal   left ventricular relaxation (grade 1 diastolic dysfunction).   Doppler parameters are consistent with elevated ventricular   end-diastolic filling pressure. - Aortic valve: There was mild regurgitation. - Left atrium: The atrium was mildly dilated. - Right ventricle: Systolic function was normal. - Right atrium: The atrium was normal in size. - Pulmonary arteries: Systolic pressure was within the normal   range. - Inferior vena cava: The vessel was normal in size. - Pericardium, extracardiac: There was no pericardial effusion.  Patient Profile     81 y.o. 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 She also has history of gastroparesis requiring surgical J-tube, deep venous thrombosis with rectus sheath hematoma on anticoagulation and IVC filter. She is now admitted to the hospital with progressive lower extremity swelling and finding of extensive bilateral DVT by venous Dopplers.  Assessment & Plan    1. Extensive bilateral lower extremity DVTs by recent outpatient venous Dopplers. Patient reports worsening leg edema over the last 4 weeks. She has a previous history of DVT and rectus sheath hematoma when anticoagulated, subsequently had IVC filter placed. She was seen by Dr. Radford Pax and subsequently Dr. Percival Spanish with plan for initiation of heparin and Coumadin. Plavix has been held and hemoglobin being followed closely.  2. History of SVT, no recurrences recently. She remains in sinus rhythm by telemetry. She is on Coreg.  3. History of ampullary cancer status post Whipple procedure.  Patient continues on heparin and Coumadin per pharmacy. INR is 1.27. Hemoglobin stable at 11.2. Follow-up surveillance lab work in a.m.  Signed, Rozann Lesches, MD  07/31/2017, 12:21 PM

## 2017-08-01 LAB — BASIC METABOLIC PANEL
Anion gap: 8 (ref 5–15)
BUN: 15 mg/dL (ref 6–20)
CO2: 27 mmol/L (ref 22–32)
Calcium: 8 mg/dL — ABNORMAL LOW (ref 8.9–10.3)
Chloride: 101 mmol/L (ref 101–111)
Creatinine, Ser: 1.28 mg/dL — ABNORMAL HIGH (ref 0.44–1.00)
GFR calc Af Amer: 45 mL/min — ABNORMAL LOW (ref >60)
GFR calc non Af Amer: 39 mL/min — ABNORMAL LOW (ref >60)
Glucose, Bld: 271 mg/dL — ABNORMAL HIGH (ref 65–99)
Potassium: 3.3 mmol/L — ABNORMAL LOW (ref 3.5–5.1)
Sodium: 136 mmol/L (ref 135–145)

## 2017-08-01 LAB — CBC
HCT: 33.3 % — ABNORMAL LOW (ref 36.0–46.0)
Hemoglobin: 10.8 g/dL — ABNORMAL LOW (ref 12.0–15.0)
MCH: 31 pg (ref 26.0–34.0)
MCHC: 32.4 g/dL (ref 30.0–36.0)
MCV: 95.7 fL (ref 78.0–100.0)
Platelets: 236 10*3/uL (ref 150–400)
RBC: 3.48 MIL/uL — ABNORMAL LOW (ref 3.87–5.11)
RDW: 15.8 % — ABNORMAL HIGH (ref 11.5–15.5)
WBC: 6.3 10*3/uL (ref 4.0–10.5)

## 2017-08-01 LAB — HEPARIN LEVEL (UNFRACTIONATED): Heparin Unfractionated: 0.67 IU/mL (ref 0.30–0.70)

## 2017-08-01 LAB — PROTIME-INR
INR: 1.26
Prothrombin Time: 15.7 seconds — ABNORMAL HIGH (ref 11.4–15.2)

## 2017-08-01 MED ORDER — WARFARIN SODIUM 7.5 MG PO TABS
7.5000 mg | ORAL_TABLET | Freq: Once | ORAL | Status: AC
  Administered 2017-08-01: 7.5 mg via ORAL
  Filled 2017-08-01: qty 1

## 2017-08-01 MED ORDER — FUROSEMIDE 40 MG PO TABS
40.0000 mg | ORAL_TABLET | Freq: Two times a day (BID) | ORAL | Status: DC
  Administered 2017-08-01 – 2017-08-06 (×10): 40 mg via ORAL
  Filled 2017-08-01 (×10): qty 1

## 2017-08-01 NOTE — Progress Notes (Signed)
ANTICOAGULATION CONSULT NOTE - Follow Up Consult  Pharmacy Consult for heparin Indication: DVT  Labs: Recent Labs    07/30/17 1556  07/30/17 1759 07/31/17 0251 07/31/17 0613 07/31/17 1517 07/31/17 2359  HGB  --    < > 11.8* 11.2*  --   --  10.8*  HCT  --   --  36.7 33.9*  --   --  33.3*  PLT  --   --  223 251  --   --  236  LABPROT  --   --  14.7  --  15.8*  --  15.7*  INR  --   --  1.16  --  1.27  --  1.26  HEPARINUNFRC  --   --   --   --   --  0.66 0.67  CREATININE 1.40*  --  1.28*  --   --   --   --    < > = values in this interval not displayed.    Assessment/Plan:  81yo female remains therapeutic on heparin. Will continue gtt at current rate and monitor daily levels.   Wynona Neat, PharmD, BCPS  08/01/2017,12:48 AM

## 2017-08-01 NOTE — Discharge Instructions (Signed)

## 2017-08-01 NOTE — Progress Notes (Signed)
Kaylee Smart MD paged about  BP of  197/73 HR 54. Patient in no distress scheduled lasix 40mg  given, will continue to monitor.

## 2017-08-01 NOTE — Progress Notes (Signed)
[place] for heparin and Coumadin Indication: DVT  Allergies  Allergen Reactions  . Hydralazine Hcl Shortness Of Breath    Pt had severe respiratory distress after receiving a dose  . Darvon [Propoxyphene Hcl] Other (See Comments)    Hallucinations   . Nyquil Multi-Symptom [Pseudoeph-Doxylamine-Dm-Apap] Other (See Comments)    Makes pt not "feel right in her head"  . Amlodipine Besylate Swelling  . Nifedipine Er Swelling  . Metformin And Related Diarrhea    Patient Measurements: Height: 5\' 4"  (162.6 cm) Weight: 206 lb 3.2 oz (93.5 kg) IBW/kg (Calculated) : 54.7 Heparin Dosing Weight: 76 kg  Assessment: 81 yo F presents on 12/14 with DVT (history of DVT earlier this year but anticoagulation was stopped due to rectus sheath hematoma; IVC placed) Not on any anticoag PTA. Pharmacy consulted to dose heparin and coumadin (day 3 of bridge). Patient is also noted with history of cancer.  -INR= 1.26, heparin level at goal   Goal of Therapy:  Heparin level 0.3-0.7 units/ml Monitor platelets by anticoagulation protocol: Yes   Plan:  No heparin changes needed Give Coumadin 7.5mg  PO x 1 tonight Monitor daily heparin level / INR, CBC  Hildred Laser, Pharm D 08/01/2017 12:05 PM

## 2017-08-01 NOTE — Progress Notes (Signed)
Progress Note  Patient Name: Kaylee Mullins Date of Encounter: 08/01/2017  Primary Cardiologist: Dr. Fransico Him  Subjective   No chest pain or shortness of breath.  Continues to have bilateral lower extremity edema with high burden of DVT.  Inpatient Medications    Scheduled Meds: . carvedilol  25 mg Oral BID  . insulin aspart  0-9 Units Subcutaneous TID WC  . levothyroxine  200 mcg Oral QAC breakfast  . pantoprazole  40 mg Oral BID  . sodium chloride flush  3 mL Intravenous Q12H  . Warfarin - Pharmacist Dosing Inpatient   Does not apply q1800   Continuous Infusions: . sodium chloride    . heparin 1,300 Units/hr (07/31/17 2000)   PRN Meds: sodium chloride, ondansetron (ZOFRAN) IV, sodium chloride flush   Vital Signs    Vitals:   07/31/17 1952 08/01/17 0010 08/01/17 0539 08/01/17 0800  BP: (!) 159/79 (!) 142/93 (!) 146/75 135/63  Pulse: 60 61 60 (!) 56  Resp: 20 20 19 17   Temp: 98.7 F (37.1 C) 98.4 F (36.9 C) 98.4 F (36.9 C) 98.3 F (36.8 C)  TempSrc: Oral Oral Oral Oral  SpO2: 100% 100% 99% 99%  Weight:   206 lb 3.2 oz (93.5 kg)   Height:        Intake/Output Summary (Last 24 hours) at 08/01/2017 1041 Last data filed at 08/01/2017 0800 Gross per 24 hour  Intake 1599.25 ml  Output -  Net 1599.25 ml   Filed Weights   07/30/17 1710 08/01/17 0539  Weight: 206 lb (93.4 kg) 206 lb 3.2 oz (93.5 kg)    Telemetry    Sinus rhythm. Personally reviewed.  ECG    None new personally reviewed.  Physical Exam   GEN: Well nourished, well developed, in no acute distress  HEENT: normal  Neck: no JVD, carotid bruits, or masses Cardiac: RRR; no murmurs, rubs, or gallops, 2-3+ edema to the thigh Respiratory:  clear to auscultation bilaterally, normal work of breathing GI: soft, nontender, nondistended, + BS MS: no deformity or atrophy  Skin: warm and dry Neuro:  Strength and sensation are intact Psych: euthymic mood, full affect   Labs      Chemistry Recent Labs  Lab 07/30/17 1556 07/30/17 1759 07/31/17 2359  NA  --  138 136  K  --  2.9* 3.3*  CL  --  100* 101  CO2  --  27 27  GLUCOSE  --  226* 271*  BUN  --  16 15  CREATININE 1.40* 1.28* 1.28*  CALCIUM  --  8.4* 8.0*  GFRNONAA  --  39* 39*  GFRAA  --  45* 45*  ANIONGAP  --  11 8     Hematology Recent Labs  Lab 07/30/17 1759 07/31/17 0251 07/31/17 2359  WBC 6.2 5.7 6.3  RBC 3.85* 3.55* 3.48*  HGB 11.8* 11.2* 10.8*  HCT 36.7 33.9* 33.3*  MCV 95.3 95.5 95.7  MCH 30.6 31.5 31.0  MCHC 32.2 33.0 32.4  RDW 15.8* 16.0* 15.8*  PLT 223 251 236    Radiology    Ct Angio Chest Pe W Or Wo Contrast  Result Date: 07/30/2017 CLINICAL DATA:  Dyspnea. 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 EXAM: CT ANGIOGRAPHY CHEST WITH CONTRAST TECHNIQUE: Multidetector CT imaging of the chest was performed using the standard protocol during bolus administration of intravenous contrast. Multiplanar CT image reconstructions and MIPs were obtained to evaluate the vascular anatomy. CONTRAST:  13mL ISOVUE-370 IOPAMIDOL (ISOVUE-370)  INJECTION 76% COMPARISON:  11/25/2016 chest CT.  01/15/2017 chest radiograph. FINDINGS: Cardiovascular: The study is high quality for the evaluation of pulmonary embolism. There are no filling defects in the central, lobar, segmental or subsegmental pulmonary artery branches to suggest acute pulmonary embolism. Atherosclerotic nonaneurysmal thoracic aorta. Normal caliber pulmonary arteries (main pulmonary artery diameter 2.9 cm). Stable mild cardiomegaly. No significant pericardial fluid/thickening. Three-vessel coronary atherosclerosis. Mediastinum/Nodes: Total thyroidectomy. Unremarkable esophagus. No pathologically enlarged axillary, mediastinal or hilar lymph nodes. Lungs/Pleura: No pneumothorax. No pleural effusion. Right middle lobe 3 mm solid pulmonary nodule (series 7/ image 44), stable since 11/25/2016 chest CT. Anterior left lower lobe  1.2 cm solid pulmonary nodule (series 7/ image 57), stable since 11/25/2016 chest CT. Mild scarring versus atelectasis at the left lung base. No acute consolidative airspace disease, lung masses or new significant pulmonary nodules. Upper abdomen: No acute abnormality. Partially visualized main pancreatic duct stent in the remnant pancreatic body. Musculoskeletal: No aggressive appearing focal osseous lesions. Heterogeneous sclerotic appearance of the thoracic vertebral bodies, not appreciably changed, with marked thoracic spondylosis. Review of the MIP images confirms the above findings. IMPRESSION: 1. No pulmonary embolism.  No acute pulmonary disease. 2. No definite evidence of metastatic disease in the chest. Two pulmonary nodules are stable since 11/25/2016, suggesting benign nodules. Recommend continued chest CT surveillance in 6 months. 3. Stable mild cardiomegaly.  Three-vessel coronary atherosclerosis. Aortic Atherosclerosis (ICD10-I70.0). These results Shahira Fiske be called to the ordering clinician or representative by the Radiology Department at the imaging location. Electronically Signed   By: Ilona Sorrel M.D.   On: 07/30/2017 16:47    Cardiac Studies   Echocardiogram 06/28/2017: Study Conclusions  - Left ventricle: The cavity size was normal. There was mild   concentric hypertrophy. Systolic function was normal. The   estimated ejection fraction was in the range of 60% to 65%. Wall   motion was normal; there were no regional wall motion   abnormalities. Doppler parameters are consistent with abnormal   left ventricular relaxation (grade 1 diastolic dysfunction).   Doppler parameters are consistent with elevated ventricular   end-diastolic filling pressure. - Aortic valve: There was mild regurgitation. - Left atrium: The atrium was mildly dilated. - Right ventricle: Systolic function was normal. - Right atrium: The atrium was normal in size. - Pulmonary arteries: Systolic pressure was  within the normal   range. - Inferior vena cava: The vessel was normal in size. - Pericardium, extracardiac: There was no pericardial effusion.  Patient Profile     81 y.o. 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 She also has history of gastroparesis requiring surgical J-tube, deep venous thrombosis with rectus sheath hematoma on anticoagulation and IVC filter. She is now admitted to the hospital with progressive lower extremity swelling and finding of extensive bilateral DVT by venous Dopplers.  Assessment & Plan    1. Extensive bilateral lower extremity DVTs by recent outpatient venous Dopplers.  Currently on heparin and Coumadin.  Does have a high risk of bleeding as well, but with DVT burden, benefits outweigh the risks.  We Tish Begin plan to continue heparin until INR is therapeutic.  She does take Lasix at home.  Kalese Ensz restart today which Kateena Degroote hopefully help with some of her lower extremity discomfort. 2. History of SVT: No recurrences.  Continues in sinus rhythm.  Continue carvedilol  3. History of ampullary cancer status post Whipple procedure.  INR 1.26.  Dosing  per pharmacy.  Bensyn Bornemann plan to stop heparin once INR therapeutic and potentially discharge.  Signed, Bhavin Monjaraz Meredith Leeds, MD  08/01/2017, 10:41 AM

## 2017-08-01 NOTE — Plan of Care (Signed)
  Progressing Education: Understanding of cardiac disease, CV risk reduction, and recovery process will improve 08/01/2017 2041 - Progressing by Blair Promise, RN Cardiac: Ability to achieve and maintain adequate cardiovascular perfusion will improve 08/01/2017 2041 - Progressing by Blair Promise, RN Health Behavior/Discharge Planning: Ability to safely manage health-related needs after discharge will improve 08/01/2017 2041 - Progressing by Blair Promise, RN Education: Knowledge of General Education information will improve 08/01/2017 2041 - Progressing by Blair Promise, RN Health Behavior/Discharge Planning: Ability to manage health-related needs will improve 08/01/2017 2041 - Progressing by Blair Promise, RN Clinical Measurements: Ability to maintain clinical measurements within normal limits will improve 08/01/2017 2041 - Progressing by Blair Promise, RN Will remain free from infection 08/01/2017 2041 - Progressing by Blair Promise, RN Diagnostic test results will improve 08/01/2017 2041 - Progressing by Blair Promise, RN Respiratory complications will improve 08/01/2017 2041 - Progressing by Blair Promise, RN Cardiovascular complication will be avoided 08/01/2017 2041 - Progressing by Blair Promise, RN Coping: Level of anxiety will decrease 08/01/2017 2041 - Progressing by Blair Promise, RN Pain Managment: General experience of comfort will improve 08/01/2017 2041 - Progressing by Blair Promise, RN Safety: Ability to remain free from injury will improve 08/01/2017 2041 - Progressing by Blair Promise, RN Skin Integrity: Risk for impaired skin integrity will decrease 08/01/2017 2041 - Progressing by Blair Promise, RN

## 2017-08-01 NOTE — Progress Notes (Signed)
[place], NP paged about potassium level of 3.3, will continue to monitor

## 2017-08-02 DIAGNOSIS — I10 Essential (primary) hypertension: Secondary | ICD-10-CM

## 2017-08-02 DIAGNOSIS — IMO0002 Reserved for concepts with insufficient information to code with codable children: Secondary | ICD-10-CM | POA: Diagnosis present

## 2017-08-02 DIAGNOSIS — N183 Chronic kidney disease, stage 3 unspecified: Secondary | ICD-10-CM | POA: Diagnosis present

## 2017-08-02 DIAGNOSIS — E1122 Type 2 diabetes mellitus with diabetic chronic kidney disease: Secondary | ICD-10-CM | POA: Diagnosis present

## 2017-08-02 DIAGNOSIS — E1100 Type 2 diabetes mellitus with hyperosmolarity without nonketotic hyperglycemic-hyperosmolar coma (NKHHC): Secondary | ICD-10-CM

## 2017-08-02 DIAGNOSIS — E1165 Type 2 diabetes mellitus with hyperglycemia: Secondary | ICD-10-CM | POA: Diagnosis present

## 2017-08-02 LAB — BASIC METABOLIC PANEL
Anion gap: 9 (ref 5–15)
BUN: 11 mg/dL (ref 6–20)
CO2: 25 mmol/L (ref 22–32)
Calcium: 8.2 mg/dL — ABNORMAL LOW (ref 8.9–10.3)
Chloride: 103 mmol/L (ref 101–111)
Creatinine, Ser: 1.22 mg/dL — ABNORMAL HIGH (ref 0.44–1.00)
GFR calc Af Amer: 48 mL/min — ABNORMAL LOW (ref >60)
GFR calc non Af Amer: 41 mL/min — ABNORMAL LOW (ref >60)
Glucose, Bld: 240 mg/dL — ABNORMAL HIGH (ref 65–99)
Potassium: 3.3 mmol/L — ABNORMAL LOW (ref 3.5–5.1)
Sodium: 137 mmol/L (ref 135–145)

## 2017-08-02 LAB — GLUCOSE, CAPILLARY
Glucose-Capillary: 286 mg/dL — ABNORMAL HIGH (ref 65–99)
Glucose-Capillary: 247 mg/dL — ABNORMAL HIGH (ref 65–99)
Glucose-Capillary: 187 mg/dL — ABNORMAL HIGH (ref 65–99)
Glucose-Capillary: 196 mg/dL — ABNORMAL HIGH (ref 65–99)
Glucose-Capillary: 222 mg/dL — ABNORMAL HIGH (ref 65–99)
Glucose-Capillary: 244 mg/dL — ABNORMAL HIGH (ref 65–99)
Glucose-Capillary: 180 mg/dL — ABNORMAL HIGH (ref 65–99)
Glucose-Capillary: 167 mg/dL — ABNORMAL HIGH (ref 65–99)
Glucose-Capillary: 228 mg/dL — ABNORMAL HIGH (ref 65–99)
Glucose-Capillary: 189 mg/dL — ABNORMAL HIGH (ref 65–99)
Glucose-Capillary: 197 mg/dL — ABNORMAL HIGH (ref 65–99)
Glucose-Capillary: 186 mg/dL — ABNORMAL HIGH (ref 65–99)

## 2017-08-02 LAB — HEPARIN LEVEL (UNFRACTIONATED)
Heparin Unfractionated: 0.81 IU/mL — ABNORMAL HIGH (ref 0.30–0.70)
Heparin Unfractionated: 0.64 IU/mL (ref 0.30–0.70)
Heparin Unfractionated: 0.6 IU/mL (ref 0.30–0.70)

## 2017-08-02 LAB — CBC
HCT: 33.7 % — ABNORMAL LOW (ref 36.0–46.0)
Hemoglobin: 10.8 g/dL — ABNORMAL LOW (ref 12.0–15.0)
MCH: 30.7 pg (ref 26.0–34.0)
MCHC: 32 g/dL (ref 30.0–36.0)
MCV: 95.7 fL (ref 78.0–100.0)
Platelets: 243 10*3/uL (ref 150–400)
RBC: 3.52 MIL/uL — ABNORMAL LOW (ref 3.87–5.11)
RDW: 16 % — ABNORMAL HIGH (ref 11.5–15.5)
WBC: 6.2 10*3/uL (ref 4.0–10.5)

## 2017-08-02 LAB — PROTIME-INR
INR: 1.22
Prothrombin Time: 15.3 seconds — ABNORMAL HIGH (ref 11.4–15.2)

## 2017-08-02 MED ORDER — INSULIN DETEMIR 100 UNIT/ML ~~LOC~~ SOLN
6.0000 [IU] | Freq: Two times a day (BID) | SUBCUTANEOUS | Status: DC
  Administered 2017-08-02 – 2017-08-06 (×9): 6 [IU] via SUBCUTANEOUS
  Filled 2017-08-02 (×10): qty 0.06

## 2017-08-02 MED ORDER — CLONIDINE HCL 0.1 MG PO TABS
0.1000 mg | ORAL_TABLET | Freq: Three times a day (TID) | ORAL | Status: DC | PRN
  Administered 2017-08-02 – 2017-08-06 (×5): 0.1 mg via ORAL
  Filled 2017-08-02 (×5): qty 1

## 2017-08-02 MED ORDER — IRBESARTAN 150 MG PO TABS
150.0000 mg | ORAL_TABLET | Freq: Every day | ORAL | Status: DC
  Administered 2017-08-02 – 2017-08-05 (×4): 150 mg via ORAL
  Filled 2017-08-02 (×4): qty 1

## 2017-08-02 MED ORDER — POTASSIUM CHLORIDE CRYS ER 20 MEQ PO TBCR
40.0000 meq | EXTENDED_RELEASE_TABLET | Freq: Every day | ORAL | Status: DC
  Administered 2017-08-02 – 2017-08-06 (×5): 40 meq via ORAL
  Filled 2017-08-02 (×5): qty 2

## 2017-08-02 MED ORDER — WARFARIN SODIUM 7.5 MG PO TABS
7.5000 mg | ORAL_TABLET | Freq: Once | ORAL | Status: AC
  Administered 2017-08-02: 7.5 mg via ORAL
  Filled 2017-08-02: qty 1

## 2017-08-02 NOTE — Progress Notes (Signed)
Progress Note  Patient Name: Kaylee Mullins Date of Encounter: 08/02/2017  Primary Cardiologist: Dr Fransico Him  Subjective   Pt is pleasant and denies any chest pain, shortness of breath or other complaints. She is in bed with her legs elevated. She says that her lower leg edema is improved.  Inpatient Medications    Scheduled Meds: . carvedilol  25 mg Oral BID  . furosemide  40 mg Oral BID  . insulin aspart  0-9 Units Subcutaneous TID WC  . levothyroxine  200 mcg Oral QAC breakfast  . pantoprazole  40 mg Oral BID  . sodium chloride flush  3 mL Intravenous Q12H  . warfarin  7.5 mg Oral ONCE-1800  . Warfarin - Pharmacist Dosing Inpatient   Does not apply q1800   Continuous Infusions: . sodium chloride    . heparin 1,100 Units/hr (08/02/17 0500)   PRN Meds: sodium chloride, ondansetron (ZOFRAN) IV, sodium chloride flush   Vital Signs    Vitals:   08/02/17 0512 08/02/17 1005 08/02/17 1006 08/02/17 1225  BP: (!) 179/75 (!) 173/75  (!) 178/73  Pulse: (!) 55 (!) 51  (!) 56  Resp: 16 20 19  (!) 22  Temp: 98.3 F (36.8 C) (!) 97.4 F (36.3 C)  98.4 F (36.9 C)  TempSrc: Oral Oral  Oral  SpO2: 100% 100%  100%  Weight: 207 lb 4.8 oz (94 kg)     Height:        Intake/Output Summary (Last 24 hours) at 08/02/2017 1332 Last data filed at 08/02/2017 1000 Gross per 24 hour  Intake 848 ml  Output 450 ml  Net 398 ml   Filed Weights   07/30/17 1710 08/01/17 0539 08/02/17 0512  Weight: 206 lb (93.4 kg) 206 lb 3.2 oz (93.5 kg) 207 lb 4.8 oz (94 kg)    Telemetry    Sinus bradycardia in the 50's with rare PAC - Personally Reviewed  ECG    No new tracings - Personally Reviewed  Physical Exam   GEN: Obese, elderly female. No acute distress.   Neck: No JVD Cardiac: RRR, no murmurs, rubs, or gallops.  Respiratory: Clear to auscultation bilaterally. GI: Soft, nontender, non-distended  MS: 1+ lower leg edema; No deformity. Neuro:  Nonfocal  Psych: Normal affect    Labs    Chemistry Recent Labs  Lab 07/30/17 1759 07/31/17 2359 08/02/17 0256  NA 138 136 137  K 2.9* 3.3* 3.3*  CL 100* 101 103  CO2 27 27 25   GLUCOSE 226* 271* 240*  BUN 16 15 11   CREATININE 1.28* 1.28* 1.22*  CALCIUM 8.4* 8.0* 8.2*  GFRNONAA 39* 39* 41*  GFRAA 45* 45* 48*  ANIONGAP 11 8 9      Hematology Recent Labs  Lab 07/31/17 0251 07/31/17 2359 08/02/17 0256  WBC 5.7 6.3 6.2  RBC 3.55* 3.48* 3.52*  HGB 11.2* 10.8* 10.8*  HCT 33.9* 33.3* 33.7*  MCV 95.5 95.7 95.7  MCH 31.5 31.0 30.7  MCHC 33.0 32.4 32.0  RDW 16.0* 15.8* 16.0*  PLT 251 236 243    Cardiac EnzymesNo results for input(s): TROPONINI in the last 168 hours. No results for input(s): TROPIPOC in the last 168 hours.   BNPNo results for input(s): BNP, PROBNP in the last 168 hours.   DDimer No results for input(s): DDIMER in the last 168 hours.   Radiology    No results found.  Cardiac Studies   Lower extremity dopplers 07/30/17 Right: Ultrasound is unable to distinguish whether  obstruction in the Common Femoral vein, Femoral vein, proximal Profunda vein, Popliteal vein, Posterior Tibial vein, and Peroneal vein is acute or chronic. No cystic structure found in the popliteal  fossa. Left: Ultrasound is unable to distinguish whether obstruction in the Common Femoral vein, Femoral vein, proximal Profunda vein, Popliteal vein, Posterior Tibial vein, and Peroneal vein is acute or chronic. No cystic structure found in the popliteal  fossa.  Echocardiogram 06/28/2017: Study Conclusions  - Left ventricle: The cavity size was normal. There was mild concentric hypertrophy. Systolic function was normal. The estimated ejection fraction was in the range of 60% to 65%. Wall motion was normal; there were no regional wall motion abnormalities. Doppler parameters are consistent with abnormal left ventricular relaxation (grade 1 diastolic dysfunction). Doppler parameters are consistent with  elevated ventricular end-diastolic filling pressure. - Aortic valve: There was mild regurgitation. - Left atrium: The atrium was mildly dilated. - Right ventricle: Systolic function was normal. - Right atrium: The atrium was normal in size. - Pulmonary arteries: Systolic pressure was within the normal range. - Inferior vena cava: The vessel was normal in size. - Pericardium, extracardiac: There was no pericardial effusion.   Patient Profile     81 y.o. 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 She also has history of gastroparesis requiring surgical J-tube, deep venous thrombosis with rectus sheath hematoma on anticoagulation and IVC filter. She is now admitted to the hospital with progressive lower extremity swelling and finding of extensive bilateral DVT by venous Dopplers.  Assessment & Plan    1. Extensive bilateral lower extremity DVT's by recent outpatient venous dopplers in setting of extensive cancer history and DVT in 03/2017. CTA chest was negative for PE. Pt is at risk with anticoagulation but risk of not anticoagulating is higher.  On heparin and coumadin with INR 1.22 today. Pharmacy dosing.   2. History or SVT. No recurrences. Continues in sinus rhythm. Continue carvedilol.   3. Hypertension: Continuing on carvedilol. Nifedipine was stopped to see if was contributing to LE edema. ARB has been on hold due to increased SCr and use of IV contrast with CT. BP is elevated.  Scr on admission was 1.40 and has trended down to 1.22. Will resume irbesartan at lower dose and monitor renal function.   4. Lower extremity edema: related to high clot burden. Continuing on lasix 40 mg bid. Pt in bed with legs elevated and states that the edema has improved.   5. Hypokalemia: K+ 2.9 on admission, 3.3 today. Had been on metolazone and lasix at home. Replacing  potassium with KDur. Continue to monitor.   6. Anemia: appears to be chronic. Hgb currently stable at 10.8. Monitor while on anticoagulation.   7. Diabetes type 2: poorly controlled. Hgb A1c in 12/2016 was 9.9. Diabetes coordinator consulted, appreciate input. Will order basal insulin.   For questions or updates, please contact Alex Please consult [email or web address] for contact info under Cardiology/STEMI.      Signed, Daune Perch, NP  08/02/2017, 1:32 PM

## 2017-08-02 NOTE — Progress Notes (Signed)
Inpatient Diabetes Program Recommendations  AACE/ADA: New Consensus Statement on Inpatient Glycemic Control (2015)  Target Ranges:  Prepandial:   less than 140 mg/dL      Peak postprandial:   less than 180 mg/dL (1-2 hours)      Critically ill patients:  140 - 180 mg/dL   Lab Results  Component Value Date   GLUCAP 189 (H) 08/02/2017   HGBA1C 9.9 (H) 01/04/2017    Review of Glycemic ControlResults for Kaylee Mullins, Kaylee Mullins (MRN [id number]) as of 08/02/2017 11:58  Ref. Range 08/01/2017 05:45 08/01/2017 10:50 08/01/2017 16:51 08/01/2017 19:50 08/02/2017 05:15 08/02/2017 11:32  Glucose-Capillary Latest Ref Range: 65 - 99 mg/dL 222 (H) 244 (H) 180 (H) 167 (H) 228 (H) 189 (H)   Diabetes history: DM Outpatient Diabetes medications:  Humalog kwikpen sliding scale every 4 hours Current orders for Inpatient glycemic control:  Novolog sensitive tid with meals  Inpatient Diabetes Program Recommendations:   Please consider adding basal insulin while patient is in the hospital.  Consider adding Levemir 6 units bid.    Thanks, Adah Perl, RN, BC-ADM Inpatient Diabetes Coordinator Pager [phone] (8a-5p)

## 2017-08-02 NOTE — Progress Notes (Signed)
ANTICOAGULATION CONSULT NOTE - Follow Up Consult  Pharmacy Consult for heparin Indication: DVT  Labs: Recent Labs    07/30/17 1556  07/30/17 1759 07/31/17 0251 07/31/17 0613 07/31/17 1517 07/31/17 2359 08/02/17 0256  HGB  --    < > 11.8* 11.2*  --   --  10.8* 10.8*  HCT  --    < > 36.7 33.9*  --   --  33.3* 33.7*  PLT  --    < > 223 251  --   --  236 243  LABPROT  --   --  14.7  --  15.8*  --  15.7*  --   INR  --   --  1.16  --  1.27  --  1.26  --   HEPARINUNFRC  --   --   --   --   --  0.66 0.67 0.81*  CREATININE 1.40*  --  1.28*  --   --   --  1.28*  --    < > = values in this interval not displayed.    Assessment: 81yo female now above goal on heparin after two level at upper end of goal; Hgb stable and no signs of bleeding per RN.  Goal of Therapy:  Heparin level 0.3-0.7 units/ml   Plan:  Will decrease heparin gtt by 2 units/kg/hr to 1100 units/hr and check level in [place], PharmD, BCPS  08/02/2017,3:52 AM

## 2017-08-02 NOTE — Progress Notes (Addendum)
Kaylee Mullins for heparin and Coumadin Indication: DVT  Allergies  Allergen Reactions  . Hydralazine Hcl Shortness Of Breath    Pt had severe respiratory distress after receiving a dose  . Darvon [Propoxyphene Hcl] Other (See Comments)    Hallucinations   . Nyquil Multi-Symptom [Pseudoeph-Doxylamine-Dm-Apap] Other (See Comments)    Makes pt not "feel right in her head"  . Amlodipine Besylate Swelling  . Nifedipine Er Swelling  . Metformin And Related Diarrhea    Patient Measurements: Height: 5\' 4"  (162.6 cm) Weight: 207 lb 4.8 oz (94 kg) IBW/kg (Calculated) : 54.7 Heparin Dosing Weight: 76 kg  Assessment: 81 yo F presents on 12/14 with DVT (history of DVT earlier this year but anticoagulation was stopped due to rectus sheath hematoma; IVC placed). Not on any anticoagulation PTA. Pharmacy consulted to dose heparin and coumadin (day 4 of bridge). Patient is also noted with history of cancer.   Heparin decreased this AM for high level - repeat pending. INR down slightly to 1.22 - hesitant to increase too quickly due to high bleed risk. Heparin level at goal, cbc stable, no bleeding documented.   Goal of Therapy:  Heparin level 0.3-0.7 units/ml  INR 2-3 Monitor platelets by anticoagulation protocol: Yes   Plan:  Continue heparin at 1100 units/hr Give increased Coumadin dose 7.5mg  PO x 1 again tonight - may need to increase further Monitor daily heparin level / INR, CBC, s/sx bleeding  [name], PharmD, BCPS Clinical Pharmacist Clinical phone for 08/02/2017 until 3:30pm: [phone] If after 3:30pm, please call main pharmacy at: [phone] 08/02/2017 10:15 AM   ADDENDUM:  Heparin level now therapeutic after rate decrease. No bleed documented.  Plan: Continue heparin at current rate 6h heparin level to confirm Daily heparin level/INR/CBC Monitor for s/sx bleeding  [name], PharmD, BCPS Clinical Pharmacist Clinical phone for 08/02/2017  until 3:30pm: [phone] If after 3:30pm, please call main pharmacy at: [phone] 08/02/2017 12:58 PM

## 2017-08-02 NOTE — Progress Notes (Signed)
[place] for heparin and Coumadin Indication: DVT  Allergies  Allergen Reactions  . Hydralazine Hcl Shortness Of Breath    Pt had severe respiratory distress after receiving a dose  . Darvon [Propoxyphene Hcl] Other (See Comments)    Hallucinations   . Nyquil Multi-Symptom [Pseudoeph-Doxylamine-Dm-Apap] Other (See Comments)    Makes pt not "feel right in her head"  . Amlodipine Besylate Swelling  . Nifedipine Er Swelling  . Metformin And Related Diarrhea    Patient Measurements: Height: 5\' 4"  (162.6 cm) Weight: 207 lb 4.8 oz (94 kg) IBW/kg (Calculated) : 54.7 Heparin Dosing Weight: 76 kg  Assessment: 81 yo F presents on 12/14 with DVT (history of DVT earlier this year but anticoagulation was stopped due to rectus sheath hematoma; IVC placed). Not on any anticoagulation PTA. Pharmacy consulted to dose heparin and coumadin (day 4 of bridge). Patient is also noted with history of cancer.   PM heparin level therapeutic  Goal of Therapy:  Heparin level 0.3-0.7 units/ml  INR 2-3 Monitor platelets by anticoagulation protocol: Yes   Plan:  Continue heparin at 1100 units / hr Follow up AM labs  Thank you Anette Guarneri, PharmD [phone] 08/02/2017 7:26 PM

## 2017-08-03 ENCOUNTER — Inpatient Hospital Stay (HOSPITAL_COMMUNITY): Payer: Medicare Other

## 2017-08-03 DIAGNOSIS — Z8509 Personal history of malignant neoplasm of other digestive organs: Secondary | ICD-10-CM | POA: Diagnosis present

## 2017-08-03 DIAGNOSIS — I471 Supraventricular tachycardia, unspecified: Secondary | ICD-10-CM | POA: Diagnosis present

## 2017-08-03 DIAGNOSIS — Z8673 Personal history of transient ischemic attack (TIA), and cerebral infarction without residual deficits: Secondary | ICD-10-CM

## 2017-08-03 DIAGNOSIS — I5032 Chronic diastolic (congestive) heart failure: Secondary | ICD-10-CM | POA: Diagnosis present

## 2017-08-03 DIAGNOSIS — R109 Unspecified abdominal pain: Secondary | ICD-10-CM | POA: Diagnosis present

## 2017-08-03 DIAGNOSIS — I10 Essential (primary) hypertension: Secondary | ICD-10-CM | POA: Diagnosis present

## 2017-08-03 DIAGNOSIS — E039 Hypothyroidism, unspecified: Secondary | ICD-10-CM | POA: Diagnosis present

## 2017-08-03 LAB — BASIC METABOLIC PANEL
Anion gap: 8 (ref 5–15)
BUN: 14 mg/dL (ref 6–20)
CO2: 26 mmol/L (ref 22–32)
Calcium: 8.6 mg/dL — ABNORMAL LOW (ref 8.9–10.3)
Chloride: 105 mmol/L (ref 101–111)
Creatinine, Ser: 1.21 mg/dL — ABNORMAL HIGH (ref 0.44–1.00)
GFR calc Af Amer: 48 mL/min — ABNORMAL LOW (ref >60)
GFR calc non Af Amer: 41 mL/min — ABNORMAL LOW (ref >60)
Glucose, Bld: 175 mg/dL — ABNORMAL HIGH (ref 65–99)
Potassium: 3.5 mmol/L (ref 3.5–5.1)
Sodium: 139 mmol/L (ref 135–145)

## 2017-08-03 LAB — HEPARIN LEVEL (UNFRACTIONATED)
Heparin Unfractionated: 0.66 IU/mL (ref 0.30–0.70)
Heparin Unfractionated: 0.59 IU/mL (ref 0.30–0.70)

## 2017-08-03 LAB — CBC
HCT: 34.7 % — ABNORMAL LOW (ref 36.0–46.0)
Hemoglobin: 11.1 g/dL — ABNORMAL LOW (ref 12.0–15.0)
MCH: 30.6 pg (ref 26.0–34.0)
MCHC: 32 g/dL (ref 30.0–36.0)
MCV: 95.6 fL (ref 78.0–100.0)
Platelets: 248 10*3/uL (ref 150–400)
RBC: 3.63 MIL/uL — ABNORMAL LOW (ref 3.87–5.11)
RDW: 16.1 % — ABNORMAL HIGH (ref 11.5–15.5)
WBC: 6.7 10*3/uL (ref 4.0–10.5)

## 2017-08-03 LAB — PROTIME-INR
INR: 1.38
Prothrombin Time: 16.8 seconds — ABNORMAL HIGH (ref 11.4–15.2)

## 2017-08-03 LAB — GLUCOSE, CAPILLARY
Glucose-Capillary: 184 mg/dL — ABNORMAL HIGH (ref 65–99)
Glucose-Capillary: 197 mg/dL — ABNORMAL HIGH (ref 65–99)
Glucose-Capillary: 191 mg/dL — ABNORMAL HIGH (ref 65–99)
Glucose-Capillary: 229 mg/dL — ABNORMAL HIGH (ref 65–99)

## 2017-08-03 LAB — TSH: TSH: 24.002 u[IU]/mL — ABNORMAL HIGH (ref 0.350–4.500)

## 2017-08-03 LAB — T4, FREE: Free T4: 0.87 ng/dL (ref 0.61–1.12)

## 2017-08-03 MED ORDER — WARFARIN SODIUM 10 MG PO TABS
10.0000 mg | ORAL_TABLET | Freq: Once | ORAL | Status: AC
  Administered 2017-08-03: 10 mg via ORAL
  Filled 2017-08-03: qty 1

## 2017-08-03 MED ORDER — IOPAMIDOL (ISOVUE-370) INJECTION 76%
INTRAVENOUS | Status: AC
  Administered 2017-08-03: 100 mL
  Filled 2017-08-03: qty 100

## 2017-08-03 NOTE — Consult Note (Signed)
Consultation note   MYKELL GENAO HYQ:[id number] DOB: February 05, 1936 DOA: 07/30/2017   PCP: Nolene Ebbs, MD   Attending physician: Marthenia Rolling  Requesting physician: Radford Pax  Reason for consultation: Assume care of patient with multiple complex medical issues  HPI: Kaylee Mullins is a 81 y.o. female with medical history significant for ampullary carcinoma status post Whipple procedure in 2018, diabetes mellitus on insulin, history of right renal cell carcinoma status post ablation, stage III chronic kidney disease, hypertension, hypothyroidism, diastolic heart failure and history of DVT.  Historically patient underwent Whipple procedure on 12/2916 with postoperative severe gastroparesis and ileus.  This is ultimately required G-tube and subsequently J-tube placement as well as utilization of parenteral nutrition.  She was diagnosed with a left popliteal DVT in June 2018 and started on anticoagulation with Lovenox given underlying malignancy at the time.  She had a protracted hospitalization in late June/July through August secondary to recurrent issues of gastroparesis and prolonged ileus requiring at least one EGD and one exploratory laparotomy procedure.  During that hospitalization she developed a large left rectus sheath hematoma measuring 8.4 x 8.7 x 23.2 cm (estimated volume 828mL).  Anticoagulation was stopped and an IVC filter was placed on 03/03/17.  Patient was discharged to a skilled nursing facility.  Since that time all indwelling feeding tubes have been removed and she was in a stable state of health until about 4 weeks ago when she began developing increasing lower extremity edema.  Her PCP increased her Lasix dosage to 80 mg without any improvement in the edema.  She finally followed up with cardiology on 12/14.  Urgent lower extremity duplex completed that revealed bilateral DVT extensive in nature so patient was sent for admission.  Heparin bridge with Coumadin was initiated.  Given  her history of rectus sheath hematoma it was felt an agent that could be reversed was more appropriate as opposed to utilization of a NOAC.  Since arrival patient has remained stable on heparin without any bleeding issues but her INR remains subtherapeutic.  She has also had issues with hyperglycemia prompting diabetes educator consultation.  Internal medicine team has asked to assume care of this patient.   Review of Systems:  In addition to the HPI above,  No Fever-chills, myalgias or other constitutional symptoms No Headache, changes with Vision or hearing, new weakness, tingling, numbness in any extremity, dizziness, dysarthria or word finding difficulty, gait disturbance or imbalance, tremors or seizure activity No problems swallowing food or Liquids, indigestion/reflux, choking or coughing while eating, abdominal pain with or after eating No Chest pain, Cough or Shortness of Breath, palpitations, orthopnea or DOE No Abdominal pain, N/V, melena,hematochezia, dark tarry stools, constipation No dysuria, malodorous urine, hematuria-patient reported prior to admission having new left flank pain No new skin rashes, lesions, masses or bruises, No new joint pains, aches, swelling or redness No recent unintentional weight gain or loss No polyuria, polydypsia or polyphagia   Past Medical History:  Diagnosis Date  . Acute CVA (cerebrovascular accident) ([place]) 09/16/2014  . Acute DVT of left tibial vein ([place]) 04/03/2017  . Adenocarcinoma ([place]) 01/12/2017  . 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 Anxiety   . Arthritis    knees  . Black tarry stools    05-14-16 negative for occult blood with ER visit- noted in [place].  . Cancer [facility])    thyroid cancer- surgery and radiation  . Cerebral infarction due to unspecified mechanism   . Chronic kidney disease    questionable mass  on kidney. Being followed by Dr Diona Fanti  . Complication of anesthesia    heart rate was really low  . CVA (cerebral  infarction) 09/11/2014  . Depression   . Diabetes mellitus    type 2  . Diabetes mellitus without complication ([place]) 6/0/4540   Qualifier: Diagnosis of  By: Loanne Drilling MD, Jacelyn Pi   . Dyslipidemia 04/20/2007   Qualifier: Diagnosis of  By: Loanne Drilling MD, Jacelyn Pi   . Full dentures   . Gastroparesis 02/06/2017  . GERD (gastroesophageal reflux disease) 04/03/2017  . History of CVA (cerebral vascular accident) ([place]) 09/11/2014  . Hypertension   . Hypothyroidism   . Lumbar stenosis with neurogenic claudication 02/10/2016  . Osteoarthritis 09/11/2014  . Pneumonia   . Right renal mass 09/13/2014  . Spinal stenosis   . Stroke ([place]) 09/2014   left sided weakness  . SVT (supraventricular tachycardia) ([place]) 07/30/2017    Past Surgical History:  Procedure Laterality Date  . ABDOMINAL HYSTERECTOMY     partial  . cataracts     Removed  11/2015  bilateral  . COLONOSCOPY W/ POLYPECTOMY    . ERCP N/A 05/07/2016   Procedure: ENDOSCOPIC RETROGRADE CHOLANGIOPANCREATOGRAPHY (ERCP);  Surgeon: Clarene Essex, MD;  Location: [facility] ENDOSCOPY;  Service: Endoscopy;  Laterality: N/A;  . ERCP N/A 10/16/2016   Procedure: ENDOSCOPIC RETROGRADE CHOLANGIOPANCREATOGRAPHY (ERCP);  Surgeon: Clarene Essex, MD;  Location: [facility] ENDOSCOPY;  Service: Endoscopy;  Laterality: N/A;  . ESOPHAGOGASTRODUODENOSCOPY N/A 02/10/2017   Procedure: ESOPHAGOGASTRODUODENOSCOPY (EGD);  Surgeon: Teena Irani, MD;  Location[facility] ENDOSCOPY;  Service: Endoscopy;  Laterality: N/A;  . EUS N/A 05/27/2016   Procedure: ESOPHAGEAL ENDOSCOPIC ULTRASOUND (EUS) RADIAL;  Surgeon: Arta Silence, MD;  Location: [facility] ENDOSCOPY;  Service: Endoscopy;  Laterality: N/A;  . FLEXIBLE SIGMOIDOSCOPY  03/29/2012   Procedure: FLEXIBLE SIGMOIDOSCOPY;  Surgeon: Jeryl Columbia, MD;  Location: [facility] ENDOSCOPY;  Service: Endoscopy;  Laterality: N/A;  fleet enema upon arrival  . HOT HEMOSTASIS  03/29/2012   Procedure: HOT HEMOSTASIS (ARGON PLASMA COAGULATION/BICAP);  Surgeon: Jeryl Columbia, MD;  Location: [facility]  ENDOSCOPY;  Service: Endoscopy;  Laterality: N/A;  . IR GASTR TUBE CONVERT GASTR-JEJ PER W/FL MOD SED  02/23/2017  . IR GASTROSTOMY TUBE MOD SED  02/16/2017  . IR GENERIC HISTORICAL  06/30/2016   IR RADIOLOGIST EVAL & MGMT 06/30/2016 Aletta Edouard, MD [facility] INTERV RAD  . IR GENERIC HISTORICAL  09/09/2016   IR RADIOLOGIST EVAL & MGMT 09/09/2016 Aletta Edouard, MD [facility] INTERV RAD  . IR IVC FILTER PLMT / S&I /IMG GUID/MOD SED  03/03/2017  . IR PATIENT EVAL TECH 0-60 MINS  05/12/2017  . LAPAROSCOPY N/A 01/12/2017   Procedure: LAPAROSCOPY DIAGNOSTIC;  Surgeon: Stark Klein, MD;  Location: [place];  Service: General;  Laterality: N/A;  . LAPAROTOMY N/A 03/10/2017   Procedure: EXPLORATORY LAPAROTOMY Open jejunostomy tube;  Surgeon: Stark Klein, MD;  Location: [place];  Service: General;  Laterality: N/A;  . LUMBAR LAMINECTOMY/DECOMPRESSION MICRODISCECTOMY N/A 02/10/2016   Procedure: Lumbar three- four Laminectomy;  Surgeon: Kristeen Miss, MD;  Location: [place] NEURO ORS;  Service: Neurosurgery;  Laterality: N/A;  L3-4 Laminectomy  . LUMBAR SPINE SURGERY     1st surgery "ray cage placed"  . THYROIDECTOMY  2005  . TONSILLECTOMY    . WHIPPLE PROCEDURE N/A 01/12/2017   Procedure: WHIPPLE PROCEDURE;  Surgeon: Stark Klein, MD;  Location: [place];  Service: General;  Laterality: N/A;    Social History   Socioeconomic History  . Marital status: Widowed  Spouse name: Not on file  . Number of children: Not on file  . Years of education: Not on file  . Highest education level: Not on file  Social Needs  . Financial resource strain: Not on file  . Food insecurity - worry: Not on file  . Food insecurity - inability: Not on file  . Transportation needs - medical: Not on file  . Transportation needs - non-medical: Not on file  Occupational History  . Occupation: housewife    Comment: college housekeeping  Tobacco Use  . Smoking status: Never Smoker  . Smokeless tobacco: Never Used  Substance and Sexual  Activity  . Alcohol use: No  . Drug use: No  . Sexual activity: No  Other Topics Concern  . Not on file  Social History Narrative   Widowed,  retired, 3 children living, 2 children deceased   Caffeine use - soda every few days   Right handed   Pt lives alone.  Using cane when out and about.    Admitted to [facility] 03/29/17   Never smoked   Alcohol none    Full Code    Mobility: Rolling walker for longer distances outside of her home otherwise occasionally utilizes a cane Work history: Not obtained   Allergies  Allergen Reactions  . Hydralazine Hcl Shortness Of Breath    Pt had severe respiratory distress after receiving a dose  . Darvon [Propoxyphene Hcl] Other (See Comments)    Hallucinations   . Nyquil Multi-Symptom [Pseudoeph-Doxylamine-Dm-Apap] Other (See Comments)    Makes pt not "feel right in her head"  . Amlodipine Besylate Swelling  . Nifedipine Er Swelling  . Metformin And Related Diarrhea    Family History  Problem Relation Age of Onset  . Heart failure Mother   . Heart attack Father    Family history reviewed and not pertinent   Prior to Admission medications   Medication Sig Start Date End Date Taking? Authorizing Provider  acetaminophen (TYLENOL) 500 MG tablet Take 500 mg by mouth every 6 (six) hours as needed for mild pain.   Yes [provider]  clopidogrel (PLAVIX) 75 MG tablet Take 75 mg daily by mouth. 05/01/17  Yes [provider]  furosemide (LASIX) 40 MG tablet Take 40 mg by mouth daily.    Yes [provider]  HUMALOG KWIKPEN 100 UNIT/ML KiwkPen USE SLIDING SCALE EVERY 4 HOURS BASED ON BLOOD SUGAR READINGS, SLIDING SCALE INCLUDED 04/28/17  Yes [provider]  irbesartan (AVAPRO) 300 MG tablet Take 1 tablet (300 mg total) by mouth daily. 02/04/17  Yes Stark Klein, MD  levothyroxine (SYNTHROID, LEVOTHROID) 200 MCG tablet Take 200 mcg by mouth daily before breakfast. For hypothyroidism 11/11/15   Yes [provider]  metolazone (ZAROXOLYN) 2.5 MG tablet TAKE 1 TABLET BY MOUTH TWICE A DAY ABOUT 30 MINUTES BEFORE FUROSEMIDE 06/21/17  Yes [provider]  NIFEdipine (PROCARDIA-XL/ADALAT-CC/NIFEDICAL-XL) 30 MG 24 hr tablet Take 30 mg by mouth daily. 07/04/17  Yes [provider]  pantoprazole (PROTONIX) 40 MG tablet Take 1 tablet (40 mg total) by mouth at bedtime. Patient taking differently: Take 40 mg by mouth 2 (two) times daily.  02/04/17  Yes Stark Klein, MD  Amino Acid Infusion in D15W (TPN SOLUTION, CLINIMIX 5/15, *NO ELECTROLYTES*) 5 % SOLN Inject into the vein continuous. 2000 ml IV over 24 hour TPN cycle as needed    [provider]  B-D UF III MINI PEN NEEDLES 31G X  5 MM MISC USE AS DIRECTED WITH HUMALOG 04/28/17   [provider]  carvedilol (COREG) 25 MG tablet Take 1 tablet (25 mg total) by mouth 2 (two) times daily. 07/30/17 10/28/17  Sueanne Margarita, MD    Physical Exam: Vitals:   08/02/17 2157 08/03/17 0015 08/03/17 0439 08/03/17 0843  BP: (!) 201/82 (!) 164/74 (!) 149/82 137/62  Pulse: (!) 49 (!) 49 (!) 54   Resp: 19 17 16  (!) 28  Temp:  97.6 F (36.4 C) 97.7 F (36.5 C)   TempSrc:  Oral Oral   SpO2: 100% 98% 100%   Weight:   94.3 kg (208 lb)   Height:          Constitutional: NAD, calm, comfortable Eyes: PERRL, lids and conjunctivae normal ENMT: Mucous membranes are moist. Posterior pharynx clear of any exudate or lesions.Normal dentition.  Neck: normal, supple, no masses, no thyromegaly Respiratory: clear to auscultation bilaterally, no wheezing, no crackles. Normal respiratory effort. No accessory muscle use.  Cardiovascular: Regular rate and rhythm, no murmurs / rubs / gallops.  Bilateral lower extremity edema extending from just above thighs to the feet-normal pitting. 2+ pedal pulses. No carotid bruits.  Abdomen: no tenderness, no masses palpated. No hepatosplenomegaly. Bowel sounds positive.  Musculoskeletal: no  clubbing / cyanosis. No joint deformity upper and lower extremities. Good ROM, no contractures. Normal muscle tone.  Skin: no rashes, lesions, ulcers. No induration Neurologic: CN 2-12 grossly intact. Sensation intact, DTR normal. Strength 5/5 x all 4 extremities.  Psychiatric: Normal judgment and insight. Alert and oriented x 3. Normal mood.    Labs on Admission: I have personally reviewed following labs and imaging studies  CBC: Recent Labs  Lab 07/30/17 1759 07/31/17 0251 07/31/17 2359 08/02/17 0256 08/03/17 0438  WBC 6.2 5.7 6.3 6.2 6.7  HGB 11.8* 11.2* 10.8* 10.8* 11.1*  HCT 36.7 33.9* 33.3* 33.7* 34.7*  MCV 95.3 95.5 95.7 95.7 95.6  PLT 223 251 236 243 144   Basic Metabolic Panel: Recent Labs  Lab 07/30/17 1556 07/30/17 1759 07/31/17 2359 08/02/17 0256 08/03/17 0438  NA  --  138 136 137 139  K  --  2.9* 3.3* 3.3* 3.5  CL  --  100* 101 103 105  CO2  --  27 27 25 26   GLUCOSE  --  226* 271* 240* 175*  BUN  --  16 15 11 14   CREATININE 1.40* 1.28* 1.28* 1.22* 1.21*  CALCIUM  --  8.4* 8.0* 8.2* 8.6*   GFR: Estimated Creatinine Clearance: 42 mL/min (A) (by C-G formula based on SCr of 1.21 mg/dL (H)). Liver Function Tests: No results for input(s): AST, ALT, ALKPHOS, BILITOT, PROT, ALBUMIN in the last 168 hours. No results for input(s): LIPASE, AMYLASE in the last 168 hours. No results for input(s): AMMONIA in the last 168 hours. Coagulation Profile: Recent Labs  Lab 07/30/17 1759 07/31/17 3154 07/31/17 2359 08/02/17 0256 08/03/17 0438  INR 1.16 1.27 1.26 1.22 1.38   Cardiac Enzymes: No results for input(s): CKTOTAL, CKMB, CKMBINDEX, TROPONINI in the last 168 hours. BNP (last 3 results) No results for input(s): PROBNP in the last 8760 hours. HbA1C: No results for input(s): HGBA1C in the last 72 hours. CBG: Recent Labs  Lab 08/02/17 0515 08/02/17 1132 08/02/17 1629 08/02/17 2101 08/03/17 0609  GLUCAP 228* 189* 197* 186* 184*   Lipid Profile: No  results for input(s): CHOL, HDL, LDLCALC, TRIG, CHOLHDL, LDLDIRECT in the last 72 hours. Thyroid Function Tests: No results for input(s):  TSH, T4TOTAL, FREET4, T3FREE, THYROIDAB in the last 72 hours. Anemia Panel: No results for input(s): VITAMINB12, FOLATE, FERRITIN, TIBC, IRON, RETICCTPCT in the last 72 hours. Urine analysis:    Component Value Date/Time   COLORURINE YELLOW 02/05/2017 2352   APPEARANCEUR HAZY (A) 02/05/2017 2352   LABSPEC 1.012 02/05/2017 2352   PHURINE 5.0 02/05/2017 2352   GLUCOSEU NEGATIVE 02/05/2017 2352   HGBUR NEGATIVE 02/05/2017 2352   BILIRUBINUR NEGATIVE 02/05/2017 2352   KETONESUR NEGATIVE 02/05/2017 2352   PROTEINUR NEGATIVE 02/05/2017 2352   UROBILINOGEN 0.2 09/16/2014 1317   NITRITE NEGATIVE 02/05/2017 2352   LEUKOCYTESUR NEGATIVE 02/05/2017 2352   Sepsis Labs: @LABRCNTIP (procalcitonin:4,lacticidven:4) )No results found for this or any previous visit (from the past 240 hour(s)).   Radiological Exams on Admission: No results found.   Assessment/Plan Principal Problem:   DVT, lower extremity, recurrent, bilateral  -Patient presented with 4 weeks of increasing bilateral lower extremity edema with venous duplex concerning for recurrent DVT -IVC filter placed in July 2018-IR consulted to evaluate efficacy of current IVC filter placement and to better evaluate severity of bilateral DVT/? possible venogram-??  Obstructed IVC filter/distal vena cava -Continue heparin/Coumadin with pharmacy managing -Patient with complete surgical cure of recent malignancy and did not require chemotherapy or radiation so doubt malignancy contributing to recurrence of DVT  Active Problems:   Diabetes mellitus type 2, uncontrolled  -Glucose has varied to between 175 and 271 this admission -Levemir resumed on 12/17 with lower CBG occurring after resumption of this medication -Continue SSI -HgbA1c is elevated at 9.9 in May -Repeat HgbA1c this admission    Left flank  pain -Onset prior to admission and prior to initiation of anticoagulation -Same location as prior large rectus sheath hematoma and could be sequela related to this -Check urinalysis and culture to rule out UTI    CKD stage 3 due to type 2 diabetes mellitus  -Renal function stable and at baseline with average GFR greater than 45 -Patient has history of right renal cell carcinoma status post ablation    Hypertension -Moderate control -Continue carvedilol, Catapres, Avapro and Lasix    Chronic diastolic heart failure  -Echocardiogram completed November 2018: Mild concentric hypertrophy, normal LVEF, grade 1 diastolic dysfunction, no valvular abnormalities and no pulmonary hypertension -Daily weights, strict I/O -Continue beta-blocker and ARB as above    Paroxysmal SVT (supraventricular tachycardia)  -Remote history of -No recent palpitations -Continue carvedilol    History of CVA (cerebrovascular accident) -Not on statin prior to admission -Plavix on hold in context of reinitiation of anticoagulation in patient with prior rectus sheath hematoma    Hypothyroidism, adult -Continue Synthroid -Check TSH    History of cancer of ampulla of duodenum -Status post Whipple procedure May 2018 with postoperative complications of severe gastroparesis requiring G-tube, then J-tube and short-term parenteral nutrition -Patient reports surgical cure and did not require chemotherapy or radiation-oncologist is Dr. Benay Spice -Currently on oral diet -Reports postprandial bloating and occasional abdominal pain -Current abdominal exam benign and no evidence of ventral hernia      DVT prophylaxis: Heparin/warfarin Code Status: Full Family Communication: No family at bedside Disposition Plan: Home Consults called: Cardiology/[place]    Samella Parr ANP-BC [facility] Pager [phone]   If 7PM-7AM, please contact night-coverage [email or web address] Password [email or web address]  08/03/2017, 9:22 AM

## 2017-08-03 NOTE — Progress Notes (Signed)
IR aware of request for eval.  D/W Dr. Annamaria Boots, will order CTA venogram protocol to evaluate the filter.  Further recommendations can be made after that scan done.  Henreitta Cea 9:25 AM 08/03/2017

## 2017-08-03 NOTE — Progress Notes (Signed)
[facility] for heparin and Coumadin Indication: DVT  Allergies  Allergen Reactions  . Hydralazine Hcl Shortness Of Breath    Pt had severe respiratory distress after receiving a dose  . Darvon [Propoxyphene Hcl] Other (See Comments)    Hallucinations   . Nyquil Multi-Symptom [Pseudoeph-Doxylamine-Dm-Apap] Other (See Comments)    Makes pt not "feel right in her head"  . Amlodipine Besylate Swelling  . Nifedipine Er Swelling  . Metformin And Related Diarrhea    Patient Measurements: Height: 5\' 4"  (162.6 cm) Weight: 208 lb (94.3 kg) IBW/kg (Calculated) : 54.7 Heparin Dosing Weight: 76 kg  Assessment: 81 yo F presents on 12/14 with DVT (history of DVT earlier this year but anticoagulation was stopped due to rectus sheath hematoma; IVC placed). Not on any anticoagulation PTA. Pharmacy consulted to dose heparin and coumadin (day 5 of bridge). Patient is also noted with history of cancer.   Heparin decreased this AM for high level - repeat pending. INR up a bit to 1.38 - very slow trend up, hesitant to increase too quickly due to high bleed risk. Heparin level at goal, cbc stable, no bleeding documented.   Goal of Therapy:  Heparin level 0.3-0.7 units/ml  INR 2-3 Monitor platelets by anticoagulation protocol: Yes   Plan:  Continue heparin at 1100 units/hr Give Coumadin 10mg  PO x 1 Monitor daily heparin level / INR, CBC, s/sx bleeding  [name], PharmD, BCPS Clinical Pharmacist Clinical phone for 08/03/2017 until 3:30pm: [phone] If after 3:30pm, please call main pharmacy at: [phone] 08/03/2017 8:45 AM

## 2017-08-03 NOTE — Care Management Note (Addendum)
Case Management Note Marvetta Gibbons RN, BSN Unit 4E-Case Manager [phone]  Patient Details  Name: Kaylee Mullins MRN: [id number] Date of Birth: 05-22-1936  Subjective/Objective: Pt admitted with recurrent bil DVTs                 Action/Plan: PTA pt lived at home- active with [facility] for HHRN/PT- if pt returns home will need resumption orders to resume [facility] services at time of discharge.   Expected Discharge Date:                  Expected Discharge Plan:     In-House Referral:     Discharge planning Services  CM Consult  Post Acute Care Choice:   Home Health, resumption of services Choice offered to:     DME Arranged:    DME Agency:     HH Arranged:    [place] Agency:   [facility]  Status of Service:   In progress  If discussed at Long Length of Stay Meetings, dates discussed:    Discharge Disposition:   Additional Comments:  Dawayne Patricia, RN 08/03/2017, 11:24 AM

## 2017-08-03 NOTE — Progress Notes (Signed)
[facility] for heparin and Coumadin Indication: DVT  Allergies  Allergen Reactions  . Hydralazine Hcl Shortness Of Breath    Pt had severe respiratory distress after receiving a dose  . Darvon [Propoxyphene Hcl] Other (See Comments)    Hallucinations   . Nyquil Multi-Symptom [Pseudoeph-Doxylamine-Dm-Apap] Other (See Comments)    Makes pt not "feel right in her head"  . Amlodipine Besylate Swelling  . Nifedipine Er Swelling  . Metformin And Related Diarrhea    Patient Measurements: Height: 5\' 4"  (162.6 cm) Weight: 208 lb (94.3 kg) IBW/kg (Calculated) : 54.7 Heparin Dosing Weight: 76 kg  Assessment: 81 yo F presents on 12/14 with DVT (history of DVT earlier this year but anticoagulation was stopped due to rectus sheath hematoma; IVC placed). Not on any anticoagulation PTA. Pharmacy consulted to dose heparin and coumadin (day 5 of bridge). Patient is also noted with history of cancer.   Heparin level at goal tonight. No bleeding issues noted. She received her coumadin earlier tonight with INR of 1.3 this am.    Goal of Therapy:  Heparin level 0.3-0.7 units/ml  INR 2-3 Monitor platelets by anticoagulation protocol: Yes   Plan:  Continue heparin at 1100 units/hr Monitor daily heparin level / INR, CBC, s/sx bleeding  Erin Hearing PharmD., BCPS Clinical Pharmacist 08/03/2017 8:48 PM

## 2017-08-03 NOTE — Care Management Important Message (Signed)
Important Message  Patient Details  Name: Kaylee Mullins MRN: [id number] Date of Birth: 1936-05-26   Medicare Important Message Given:  Yes    Onalee Steinbach Abena 08/03/2017, 10:07 AM

## 2017-08-03 NOTE — Progress Notes (Signed)
[facility]  Patient Status: Active (receiving services up to time of hospitalization)  [facility] is providing the following services: RN and PT  If patient discharges after hours, please call [phone].   Kaylee Mullins 08/03/2017, 10:10 AM

## 2017-08-03 NOTE — Consult Note (Signed)
Chief Complaint: Patient was seen in consultation today for inferior vena cava and bilateral lower extremity thrombolysis Chief Complaint  Patient presents with  . Leg Swelling   at the request of Dr Marthenia Rolling  Supervising Physician: Daryll Brod  Patient Status: [facility] - In-pt  History of Present Illness: ZOI DEVINE is a 81 y.o. female   Bilat lower extremity edema; weakness; fatigue Symptoms x few weeks---worsening  07/30/17 Doppler: Final Interpretation Right: Ultrasound is unable to distinguish whether obstruction in the Common Femoral vein, Femoral vein, proximal Profunda vein, Popliteal vein, Posterior Tibial vein, and Peroneal vein is acute or chronic. No cystic structure found in the popliteal  fossa. Left: Ultrasound is unable to distinguish whether obstruction in the Common Femoral vein, Femoral vein, proximal Profunda vein, Popliteal vein, Posterior Tibial vein, and Peroneal vein is acute or chronic. No cystic structure found in the popliteal  Fossa. Admitted through ED 07/30/17  Known previous L post tibial vein DVT 01/2017 Was anticoagulated and developed rectus sheath hematoma IVC filter placed in IR 03/03/2017  Hx ampullary carcinoma- post Whipple Renal cell cancer- post ablation HTN; Diastolic heart failure  After Whipple pt had long hospital stay complicated by LLE DVT and rectus sheath hematoma IVC filter placed and home to SNF Did well til 4 weeks ago when noted increasing B LE swelling and pain  Admitted and now started on Heparin/coumadin INR 1.38 today Feeling much better She says legs are less swollen Walking better Only pain now is in lateral left ankle area  CT Venogram today does reveal: IMPRESSION: Limited CT venogram but findings suggest 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 IVC above the filter is well opacified and patent. Atherosclerosis as above Hepatic steatosis Bibasilar atelectasis Postop  changes of the abdomen as above without bowel obstruction para Resolving left inferior rectus sheath hematoma 3 mm right middle lobe pulmonary nodule No follow-up needed if patient is low-risk. Non-contrast chest CT can be considered in 12 months if patient is high-risk.   IR has been asked to review case and consider thrombolysis procedure    Past Medical History:  Diagnosis Date  . Acute CVA (cerebrovascular accident) ([place]) 09/16/2014  . Acute DVT of left tibial vein ([place]) 04/03/2017  . Adenocarcinoma ([place]) 01/12/2017  . 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 Anxiety   . Arthritis    knees  . Black tarry stools    05-14-16 negative for occult blood with ER visit- noted in [place].  . Cancer [facility])    thyroid cancer- surgery and radiation  . Cerebral infarction due to unspecified mechanism   . Chronic kidney disease    questionable mass on kidney. Being followed by Dr Diona Fanti  . Complication of anesthesia    heart rate was really low  . CVA (cerebral infarction) 09/11/2014  . Depression   . Diabetes mellitus    type 2  . Diabetes mellitus without complication ([place]) 09/20/4008   Qualifier: Diagnosis of  By: Loanne Drilling MD, Jacelyn Pi   . Dyslipidemia 04/20/2007   Qualifier: Diagnosis of  By: Loanne Drilling MD, Jacelyn Pi   . Full dentures   . Gastroparesis 02/06/2017  . GERD (gastroesophageal reflux disease) 04/03/2017  . History of CVA (cerebral vascular accident) ([place]) 09/11/2014  . Hypertension   . Hypothyroidism   . Lumbar stenosis with neurogenic claudication 02/10/2016  . Osteoarthritis 09/11/2014  . Pneumonia   . Right renal mass 09/13/2014  . Spinal stenosis   . Stroke [facility])  09/2014   left sided weakness  . SVT (supraventricular tachycardia) ([place]) 07/30/2017    Past Surgical History:  Procedure Laterality Date  . ABDOMINAL HYSTERECTOMY     partial  . cataracts     Removed  11/2015  bilateral  . COLONOSCOPY W/ POLYPECTOMY    . ERCP N/A 05/07/2016   Procedure: ENDOSCOPIC RETROGRADE  CHOLANGIOPANCREATOGRAPHY (ERCP);  Surgeon: Clarene Essex, MD;  Location: [facility] ENDOSCOPY;  Service: Endoscopy;  Laterality: N/A;  . ERCP N/A 10/16/2016   Procedure: ENDOSCOPIC RETROGRADE CHOLANGIOPANCREATOGRAPHY (ERCP);  Surgeon: Clarene Essex, MD;  Location: [facility] ENDOSCOPY;  Service: Endoscopy;  Laterality: N/A;  . ESOPHAGOGASTRODUODENOSCOPY N/A 02/10/2017   Procedure: ESOPHAGOGASTRODUODENOSCOPY (EGD);  Surgeon: Teena Irani, MD;  Location: [facility] ENDOSCOPY;  Service: Endoscopy;  Laterality: N/A;  . EUS N/A 05/27/2016   Procedure: ESOPHAGEAL ENDOSCOPIC ULTRASOUND (EUS) RADIAL;  Surgeon: Arta Silence, MD;  Location: [facility] ENDOSCOPY;  Service: Endoscopy;  Laterality: N/A;  . FLEXIBLE SIGMOIDOSCOPY  03/29/2012   Procedure: FLEXIBLE SIGMOIDOSCOPY;  Surgeon: Jeryl Columbia, MD;  Location: [facility] ENDOSCOPY;  Service: Endoscopy;  Laterality: N/A;  fleet enema upon arrival  . HOT HEMOSTASIS  03/29/2012   Procedure: HOT HEMOSTASIS (ARGON PLASMA COAGULATION/BICAP);  Surgeon: Jeryl Columbia, MD;  Location: [facility] ENDOSCOPY;  Service: Endoscopy;  Laterality: N/A;  . IR GASTR TUBE CONVERT GASTR-JEJ PER W/FL MOD SED  02/23/2017  . IR GASTROSTOMY TUBE MOD SED  02/16/2017  . IR GENERIC HISTORICAL  06/30/2016   IR RADIOLOGIST EVAL & MGMT 06/30/2016 Aletta Edouard, MD [facility] INTERV RAD  . IR GENERIC HISTORICAL  09/09/2016   IR RADIOLOGIST EVAL & MGMT 09/09/2016 Aletta Edouard, MD [facility] INTERV RAD  . IR IVC FILTER PLMT / S&I /IMG GUID/MOD SED  03/03/2017  . IR PATIENT EVAL TECH 0-60 MINS  05/12/2017  . LAPAROSCOPY N/A 01/12/2017   Procedure: LAPAROSCOPY DIAGNOSTIC;  Surgeon: Stark Klein, MD;  Location: [place];  Service: General;  Laterality: N/A;  . LAPAROTOMY N/A 03/10/2017   Procedure: EXPLORATORY LAPAROTOMY Open jejunostomy tube;  Surgeon: Stark Klein, MD;  Location: [place];  Service: General;  Laterality: N/A;  . LUMBAR LAMINECTOMY/DECOMPRESSION MICRODISCECTOMY N/A 02/10/2016   Procedure: Lumbar three- four Laminectomy;  Surgeon: Kristeen Miss,  MD;  Location: [place] NEURO ORS;  Service: Neurosurgery;  Laterality: N/A;  L3-4 Laminectomy  . LUMBAR SPINE SURGERY     1st surgery "ray cage placed"  . THYROIDECTOMY  2005  . TONSILLECTOMY    . WHIPPLE PROCEDURE N/A 01/12/2017   Procedure: WHIPPLE PROCEDURE;  Surgeon: Stark Klein, MD;  Location: [place];  Service: General;  Laterality: N/A;    Allergies: Hydralazine hcl; Darvon [propoxyphene hcl]; Nyquil multi-symptom [pseudoeph-doxylamine-dm-apap]; Amlodipine besylate; Nifedipine er; and Metformin and related  Medications: Prior to Admission medications   Medication Sig Start Date End Date Taking? Authorizing Provider  acetaminophen (TYLENOL) 500 MG tablet Take 500 mg by mouth every 6 (six) hours as needed for mild pain.   Yes [provider]  clopidogrel (PLAVIX) 75 MG tablet Take 75 mg daily by mouth. 05/01/17  Yes [provider]  furosemide (LASIX) 40 MG tablet Take 40 mg by mouth daily.    Yes [provider]  HUMALOG KWIKPEN 100 UNIT/ML KiwkPen USE SLIDING SCALE EVERY 4 HOURS BASED ON BLOOD SUGAR READINGS, SLIDING SCALE INCLUDED 04/28/17  Yes [provider]  irbesartan (AVAPRO) 300 MG tablet Take 1 tablet (300 mg total) by mouth daily. 02/04/17  Yes Stark Klein, MD  levothyroxine (SYNTHROID, LEVOTHROID) 200 MCG tablet Take  200 mcg by mouth daily before breakfast. For hypothyroidism 11/11/15  Yes [provider]  metolazone (ZAROXOLYN) 2.5 MG tablet TAKE 1 TABLET BY MOUTH TWICE A DAY ABOUT 30 MINUTES BEFORE FUROSEMIDE 06/21/17  Yes [provider]  NIFEdipine (PROCARDIA-XL/ADALAT-CC/NIFEDICAL-XL) 30 MG 24 hr tablet Take 30 mg by mouth daily. 07/04/17  Yes [provider]  pantoprazole (PROTONIX) 40 MG tablet Take 1 tablet (40 mg total) by mouth at bedtime. Patient taking differently: Take 40 mg by mouth 2 (two) times daily.  02/04/17  Yes Stark Klein, MD  Amino Acid Infusion in D15W (TPN SOLUTION, CLINIMIX 5/15, *NO  ELECTROLYTES*) 5 % SOLN Inject into the vein continuous. 2000 ml IV over 24 hour TPN cycle as needed    [provider]  B-D UF III MINI PEN NEEDLES 31G X 5 MM MISC USE AS DIRECTED WITH HUMALOG 04/28/17   [provider]  carvedilol (COREG) 25 MG tablet Take 1 tablet (25 mg total) by mouth 2 (two) times daily. 07/30/17 10/28/17  Sueanne Margarita, MD     Family History  Problem Relation Age of Onset  . Heart failure Mother   . Heart attack Father     Social History   Socioeconomic History  . Marital status: Widowed    Spouse name: None  . Number of children: None  . Years of education: None  . Highest education level: None  Social Needs  . Financial resource strain: None  . Food insecurity - worry: None  . Food insecurity - inability: None  . Transportation needs - medical: None  . Transportation needs - non-medical: None  Occupational History  . Occupation: housewife    Comment: college housekeeping  Tobacco Use  . Smoking status: Never Smoker  . Smokeless tobacco: Never Used  Substance and Sexual Activity  . Alcohol use: No  . Drug use: No  . Sexual activity: No  Other Topics Concern  . None  Social History Narrative   Widowed,  retired, 3 children living, 2 children deceased   Caffeine use - soda every few days   Right handed   Pt lives alone.  Using cane when out and about.    Admitted to [facility] 03/29/17   Never smoked   Alcohol none    Full Code    Review of Systems: A 12 point ROS discussed and pertinent positives are indicated in the HPI above.  All other systems are negative.  Review of Systems  Constitutional: Positive for activity change and fatigue. Negative for fever and unexpected weight change.  Respiratory: Negative for shortness of breath.   Cardiovascular: Negative for chest pain.  Gastrointestinal: Negative for abdominal pain.  Musculoskeletal: Positive for gait problem.  Neurological: Positive for weakness.    Psychiatric/Behavioral: Negative for behavioral problems and confusion.    Vital Signs: BP 137/62   Pulse (!) 54   Temp 97.7 F (36.5 C) (Oral)   Resp (!) 28   Ht 5\' 4"  (1.626 m)   Wt 208 lb (94.3 kg)   SpO2 100%   BMI 35.70 kg/m   Physical Exam  Constitutional: She is oriented to person, place, and time.  HENT:  Head: Atraumatic.  Cardiovascular: Normal rate, regular rhythm and normal heart sounds.  Pulmonary/Chest: Effort normal and breath sounds normal.  Abdominal: Soft. Bowel sounds are normal.  Musculoskeletal: Normal range of motion. She exhibits edema and tenderness. She exhibits no deformity.  Left ankle area tenderness  B LE swelling (  better now per pt)  Neurological: She is alert and oriented to person, place, and time.  Skin: Skin is warm and dry.  1+ pitting edema B feet   Psychiatric: She has a normal mood and affect. Her behavior is normal. Judgment and thought content normal.  Nursing note and vitals reviewed.   Imaging: Ct Angio Chest Pe W Or Wo Contrast  Result Date: 07/30/2017 CLINICAL DATA:  Dyspnea. 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 EXAM: CT ANGIOGRAPHY CHEST WITH CONTRAST TECHNIQUE: Multidetector CT imaging of the chest was performed using the standard protocol during bolus administration of intravenous contrast. Multiplanar CT image reconstructions and MIPs were obtained to evaluate the vascular anatomy. CONTRAST:  168mL ISOVUE-370 IOPAMIDOL (ISOVUE-370) INJECTION 76% COMPARISON:  11/25/2016 chest CT.  01/15/2017 chest radiograph. FINDINGS: Cardiovascular: The study is high quality for the evaluation of pulmonary embolism. There are no filling defects in the central, lobar, segmental or subsegmental pulmonary artery branches to suggest acute pulmonary embolism. Atherosclerotic nonaneurysmal thoracic aorta. Normal caliber pulmonary arteries (main pulmonary artery diameter 2.9 cm). Stable mild cardiomegaly. No significant  pericardial fluid/thickening. Three-vessel coronary atherosclerosis. Mediastinum/Nodes: Total thyroidectomy. Unremarkable esophagus. No pathologically enlarged axillary, mediastinal or hilar lymph nodes. Lungs/Pleura: No pneumothorax. No pleural effusion. Right middle lobe 3 mm solid pulmonary nodule (series 7/ image 44), stable since 11/25/2016 chest CT. Anterior left lower lobe 1.2 cm solid pulmonary nodule (series 7/ image 57), stable since 11/25/2016 chest CT. Mild scarring versus atelectasis at the left lung base. No acute consolidative airspace disease, lung masses or new significant pulmonary nodules. Upper abdomen: No acute abnormality. Partially visualized main pancreatic duct stent in the remnant pancreatic body. Musculoskeletal: No aggressive appearing focal osseous lesions. Heterogeneous sclerotic appearance of the thoracic vertebral bodies, not appreciably changed, with marked thoracic spondylosis. Review of the MIP images confirms the above findings. IMPRESSION: 1. No pulmonary embolism.  No acute pulmonary disease. 2. No definite evidence of metastatic disease in the chest. Two pulmonary nodules are stable since 11/25/2016, suggesting benign nodules. Recommend continued chest CT surveillance in 6 months. 3. Stable mild cardiomegaly.  Three-vessel coronary atherosclerosis. Aortic Atherosclerosis (ICD10-I70.0). These results will be called to the ordering clinician or representative by the Radiology Department at the imaging location. Electronically Signed   By: Ilona Sorrel M.D.   On: 07/30/2017 16:47    Labs:  CBC: Recent Labs    07/31/17 0251 07/31/17 2359 08/02/17 0256 08/03/17 0438  WBC 5.7 6.3 6.2 6.7  HGB 11.2* 10.8* 10.8* 11.1*  HCT 33.9* 33.3* 33.7* 34.7*  PLT 251 236 243 248    COAGS: Recent Labs    02/16/17 0456  07/31/17 0613 07/31/17 2359 08/02/17 0256 08/03/17 0438  INR 1.24   < > 1.27 1.26 1.22 1.38  APTT 42*  --   --   --   --   --    < > = values in this  interval not displayed.    BMP: Recent Labs    07/30/17 1759 07/31/17 2359 08/02/17 0256 08/03/17 0438  NA 138 136 137 139  K 2.9* 3.3* 3.3* 3.5  CL 100* 101 103 105  CO2 27 27 25 26   GLUCOSE 226* 271* 240* 175*  BUN 16 15 11 14   CALCIUM 8.4* 8.0* 8.2* 8.6*  CREATININE 1.28* 1.28* 1.22* 1.21*  GFRNONAA 39* 39* 41* 41*  GFRAA 45* 45* 48* 48*    LIVER FUNCTION TESTS: Recent Labs    03/18/17 0254 03/22/17 0543 03/25/17 0437 03/29/17 0508 04/09/17 04/20/17  04/27/17  BILITOT 1.0 0.7 0.6 0.6  --   --   --   AST 23 21 38 26 21 21 27   ALT 22 18 31 21 15 14 16   ALKPHOS 106 92 99 82 85 82 91  PROT 6.5 7.1 7.6 7.4  --   --   --   ALBUMIN 2.2* 2.6* 2.8* 2.9*  --   --   --     TUMOR MARKERS: No results for input(s): AFPTM, CEA, CA199, CHROMGRNA in the last 8760 hours.  Assessment and Plan:  B LE edema CT veno revealing B iliac vein and lower IVC thrombus Symptoms improving with IV Heparin and coumadin Discussed with Dr Annamaria Boots Will also speak to pts MD about consideration of thrombolysis I have spoken to pt concerning possible procedure-- She would like to wait to see if anticoagulation will be enough treatment---but will consider other possibilities if necessary. IR PA will recheck in am   Thank you for this interesting consult.  I greatly enjoyed meeting TYSHAY ADEE and look forward to participating in their care.  A copy of this report was sent to the requesting provider on this date.  Electronically Signed: Lavonia Drafts, PA-C 08/03/2017, 3:43 PM   I spent a total of 40 Minutes    in face to face in clinical consultation, greater than 50% of which was counseling/coordinating care for IVC and B iliac thrombolysis

## 2017-08-04 DIAGNOSIS — T4145XA Adverse effect of unspecified anesthetic, initial encounter: Secondary | ICD-10-CM | POA: Insufficient documentation

## 2017-08-04 DIAGNOSIS — F329 Major depressive disorder, single episode, unspecified: Secondary | ICD-10-CM | POA: Insufficient documentation

## 2017-08-04 DIAGNOSIS — M1990 Unspecified osteoarthritis, unspecified site: Secondary | ICD-10-CM | POA: Insufficient documentation

## 2017-08-04 DIAGNOSIS — C801 Malignant (primary) neoplasm, unspecified: Secondary | ICD-10-CM | POA: Insufficient documentation

## 2017-08-04 DIAGNOSIS — F32A Depression, unspecified: Secondary | ICD-10-CM | POA: Insufficient documentation

## 2017-08-04 DIAGNOSIS — F419 Anxiety disorder, unspecified: Secondary | ICD-10-CM | POA: Insufficient documentation

## 2017-08-04 DIAGNOSIS — Z972 Presence of dental prosthetic device (complete) (partial): Secondary | ICD-10-CM

## 2017-08-04 DIAGNOSIS — T8859XA Other complications of anesthesia, initial encounter: Secondary | ICD-10-CM | POA: Insufficient documentation

## 2017-08-04 DIAGNOSIS — E1165 Type 2 diabetes mellitus with hyperglycemia: Secondary | ICD-10-CM

## 2017-08-04 DIAGNOSIS — K08109 Complete loss of teeth, unspecified cause, unspecified class: Secondary | ICD-10-CM | POA: Insufficient documentation

## 2017-08-04 DIAGNOSIS — J189 Pneumonia, unspecified organism: Secondary | ICD-10-CM | POA: Insufficient documentation

## 2017-08-04 DIAGNOSIS — K921 Melena: Secondary | ICD-10-CM | POA: Insufficient documentation

## 2017-08-04 DIAGNOSIS — M4800 Spinal stenosis, site unspecified: Secondary | ICD-10-CM | POA: Insufficient documentation

## 2017-08-04 DIAGNOSIS — I5032 Chronic diastolic (congestive) heart failure: Secondary | ICD-10-CM

## 2017-08-04 DIAGNOSIS — N189 Chronic kidney disease, unspecified: Secondary | ICD-10-CM | POA: Insufficient documentation

## 2017-08-04 LAB — BASIC METABOLIC PANEL
Anion gap: 9 (ref 5–15)
BUN: 12 mg/dL (ref 6–20)
CO2: 23 mmol/L (ref 22–32)
Calcium: 8.4 mg/dL — ABNORMAL LOW (ref 8.9–10.3)
Chloride: 105 mmol/L (ref 101–111)
Creatinine, Ser: 1.14 mg/dL — ABNORMAL HIGH (ref 0.44–1.00)
GFR calc Af Amer: 52 mL/min — ABNORMAL LOW (ref >60)
GFR calc non Af Amer: 45 mL/min — ABNORMAL LOW (ref >60)
Glucose, Bld: 208 mg/dL — ABNORMAL HIGH (ref 65–99)
Potassium: 3.2 mmol/L — ABNORMAL LOW (ref 3.5–5.1)
Sodium: 137 mmol/L (ref 135–145)

## 2017-08-04 LAB — GLUCOSE, CAPILLARY
Glucose-Capillary: 187 mg/dL — ABNORMAL HIGH (ref 65–99)
Glucose-Capillary: 210 mg/dL — ABNORMAL HIGH (ref 65–99)
Glucose-Capillary: 192 mg/dL — ABNORMAL HIGH (ref 65–99)
Glucose-Capillary: 221 mg/dL — ABNORMAL HIGH (ref 65–99)
Glucose-Capillary: 203 mg/dL — ABNORMAL HIGH (ref 65–99)

## 2017-08-04 LAB — CBC
HCT: 33.7 % — ABNORMAL LOW (ref 36.0–46.0)
Hemoglobin: 10.8 g/dL — ABNORMAL LOW (ref 12.0–15.0)
MCH: 30.5 pg (ref 26.0–34.0)
MCHC: 32 g/dL (ref 30.0–36.0)
MCV: 95.2 fL (ref 78.0–100.0)
Platelets: 249 10*3/uL (ref 150–400)
RBC: 3.54 MIL/uL — ABNORMAL LOW (ref 3.87–5.11)
RDW: 16.2 % — ABNORMAL HIGH (ref 11.5–15.5)
WBC: 6.4 10*3/uL (ref 4.0–10.5)

## 2017-08-04 LAB — PROTIME-INR
INR: 1.57
Prothrombin Time: 18.7 seconds — ABNORMAL HIGH (ref 11.4–15.2)

## 2017-08-04 LAB — HEMOGLOBIN A1C
Hgb A1c MFr Bld: 8.4 % — ABNORMAL HIGH (ref 4.8–5.6)
Mean Plasma Glucose: 194 mg/dL

## 2017-08-04 LAB — HEPARIN LEVEL (UNFRACTIONATED): Heparin Unfractionated: 0.67 IU/mL (ref 0.30–0.70)

## 2017-08-04 MED ORDER — WARFARIN SODIUM 7.5 MG PO TABS
7.5000 mg | ORAL_TABLET | Freq: Once | ORAL | Status: AC
  Administered 2017-08-04: 7.5 mg via ORAL
  Filled 2017-08-04: qty 1

## 2017-08-04 NOTE — Progress Notes (Signed)
PROGRESS NOTE [facility]   Kaylee Mullins   BDZ:[id number] DOB: 1936-05-12  DOA: 07/30/2017 PCP: Nolene Ebbs, MD   Brief Narrative:  Kaylee Mullins is a 81 y.o. female with medical history significant for ampullary carcinoma status post Whipple procedure in 2018, diabetes mellitus on insulin, history of right renal cell carcinoma status post ablation, stage III chronic kidney disease, hypertension, hypothyroidism, diastolic heart failure and history of DVT.  Historically patient underwent Whipple procedure on 01/12/17 with postoperative severe gastroparesis and ileus. She was diagnosed with a left popliteal DVT in June 2018 and started on anticoagulation with Lovenox given underlying malignancy at the time.  She had a protracted hospitalization in late June/July through August secondary to recurrent issues of gastroparesis and prolonged ileus requiring at least one EGD and one exploratory laparotomy procedure.  During that hospitalization she developed a large left rectus sheath hematoma measuring 8.4 x 8.7 x 23.2 cm (estimated volume 830mL).  Anticoagulation was stopped and an IVC filter was placed on 03/03/17. Subsequently patient developed LE edema which was treated with high dose Lasix. LE doppler was performed and showed bilateral extensive DVT and patient was admitted by cardiology services. Patient was started on heparin bridge with warfarin. 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 IVC above the filter patent. Patient awaiting therapeutic INR.   Subjective: Patient seen and examined, she have no complains, report that her legs are much better. No acute events overnight. Denies CP, SOB and palpitations   Assessment & Plan: DVT lower extremities, below IVC filter thrombus  IR recommendations appreciated, symptoms improving with standard treatment no surgical intervention at this time.  Continue heparin bridge to warfarin. INR goal 2-3, if close to 2 can d/c home with Lovenox injection patient feels comfortable injecting her self. Lower extremity edema improved, TED Hose   Chronic diastolic CHF/ Paroxymal SVT  Seem compensated at this time, HR stable.  Continue current regimen   DM type 2 uncontrolled with hyperglycemia A1C 8.4 improved form 9.9 12/2016 Continue Levamir will adjust pending 24 hrs requirement  Continue SSI   CKD stage 3 Cr stable  Continue to monitor   HTN  BP ok  Continue current management for now   Hypothyroidism  TSH elevated, T4 normal - subclinical  Continue Synthroid at current dose and check TSH in 6 weeks   DVT prophylaxis: Warfarin  Code Status: Full code  Family Communication: None at bedside  Disposition Plan: Home in the next 24-48 hrs   Consultants:   IR  Cardiology   Procedures:   None   Antimicrobials:  None     Objective: Vitals:   08/04/17 0012 08/04/17 0515 08/04/17 0806 08/04/17 1224  BP: (!) 158/73 (!) 146/66 (!) 138/54 (!) 155/68  Pulse: 60  80 96  Resp: (!) 27 15 16    Temp:  97.7 F (36.5 C) 98.3 F (36.8 C) (!) 96.6 F (35.9 C)  TempSrc:  Oral Oral Axillary  SpO2: 100% 97% 99%   Weight:      Height:        Intake/Output Summary (Last 24 hours) at 08/04/2017 1657 Last data filed at 08/04/2017 1114 Gross per 24 hour  Intake 125 ml  Output 300 ml  Net -175 ml   Filed Weights   08/01/17 0539 08/02/17 0512 08/03/17 0439  Weight: 93.5 kg (206 lb 3.2 oz) 94 kg (207 lb 4.8 oz) 94.3 kg (208  lb)    Examination:  General exam: Appears calm and comfortable  HEENT: OP moist and clear Respiratory system: Clear to auscultation. No wheezes,crackle or rhonchi Cardiovascular system: S1 & S2 heard, RRR. No JVD, murmurs, rubs or gallops Gastrointestinal system: Abd is nondistended, soft and nontender. Normal bowel sounds heard. Central nervous system: Alert and oriented. No focal neurological  deficits. Extremities: B/l LE edema   Skin: No rashes Psychiatry: Mood & affect appropriate.    Data Reviewed: I have personally reviewed following labs and imaging studies  CBC: Recent Labs  Lab 07/31/17 0251 07/31/17 2359 08/02/17 0256 08/03/17 0438 08/04/17 0239  WBC 5.7 6.3 6.2 6.7 6.4  HGB 11.2* 10.8* 10.8* 11.1* 10.8*  HCT 33.9* 33.3* 33.7* 34.7* 33.7*  MCV 95.5 95.7 95.7 95.6 95.2  PLT 251 236 243 248 269   Basic Metabolic Panel: Recent Labs  Lab 07/30/17 1759 07/31/17 2359 08/02/17 0256 08/03/17 0438 08/04/17 0239  NA 138 136 137 139 137  K 2.9* 3.3* 3.3* 3.5 3.2*  CL 100* 101 103 105 105  CO2 27 27 25 26 23   GLUCOSE 226* 271* 240* 175* 208*  BUN 16 15 11 14 12   CREATININE 1.28* 1.28* 1.22* 1.21* 1.14*  CALCIUM 8.4* 8.0* 8.2* 8.6* 8.4*   GFR: Estimated Creatinine Clearance: 44.5 mL/min (A) (by C-G formula based on SCr of 1.14 mg/dL (H)). Liver Function Tests: No results for input(s): AST, ALT, ALKPHOS, BILITOT, PROT, ALBUMIN in the last 168 hours. No results for input(s): LIPASE, AMYLASE in the last 168 hours. No results for input(s): AMMONIA in the last 168 hours. Coagulation Profile: Recent Labs  Lab 07/31/17 0613 07/31/17 2359 08/02/17 0256 08/03/17 0438 08/04/17 0239  INR 1.27 1.26 1.22 1.38 1.57   Cardiac Enzymes: No results for input(s): CKTOTAL, CKMB, CKMBINDEX, TROPONINI in the last 168 hours. BNP (last 3 results) No results for input(s): PROBNP in the last 8760 hours. HbA1C: Recent Labs    08/03/17 0438  HGBA1C 8.4*   CBG: Recent Labs  Lab 08/03/17 2041 08/04/17 0610 08/04/17 0813 08/04/17 1107 08/04/17 1649  GLUCAP 229* 187* 210* 192* 221*   Lipid Profile: No results for input(s): CHOL, HDL, LDLCALC, TRIG, CHOLHDL, LDLDIRECT in the last 72 hours. Thyroid Function Tests: Recent Labs    08/03/17 0904  TSH 24.002*  FREET4 0.87   Anemia Panel: No results for input(s): VITAMINB12, FOLATE, FERRITIN, TIBC, IRON,  RETICCTPCT in the last 72 hours. Sepsis Labs: No results for input(s): PROCALCITON, LATICACIDVEN in the last 168 hours.  No results found for this or any previous visit (from the past 240 hour(s)).    Radiology Studies: Ct Angio Abd/pel W/ And/or W/o  Result Date: 08/03/2017 CLINICAL DATA:  Filter, DVT, assess for clot extension centrally EXAM: CT ANGIOGRAPHY ABDOMEN AND PELVIS TECHNIQUE: Multidetector CT imaging of the abdomen and pelvis was performed using the standard protocol during bolus administration of intravenous contrast. Multiplanar reconstructed images including MIPs were obtained and reviewed to evaluate the vascular anatomy. CONTRAST:  100 cc Isovue 370 COMPARISON:  03/24/2017 FINDINGS: Arterial findings: Aorta: Aortic atherosclerosis without aneurysm, dissection, retroperitoneal hemorrhage, or significant occlusive process. Celiac axis: Heavily calcified origin with ostial narrowing, difficult to truly assess but estimated greater than 50%. Celiac axis remains patent. Superior mesenteric: Calcified origin but remains patent. Minor narrowing. Left renal: Heavily calcified with ostial narrowing, difficult to truly assess degree of stenosis. Right renal: Minor calcified origin. No significant narrowing. Inferior mesenteric:  Remains patent off of the distal aorta. Left  iliac: Left common, internal external iliac arteries are calcified but remain patent. No inflow disease. Right iliac: Right common, internal and external iliac arteries are patent. Mild atherosclerosis and tortuosity. No inflow disease. Venous findings: Hepatic, portal, mesenteric, splenic, and renal veins are all patent. Suprarenal IVC and hepatic IVC are patent. Infrarenal IVC filter noted terminates at the bifurcation. Below the filter the study is limited but there does appear to be hypodense thrombus in the IVC below the filter extending into the iliac veins to some degree. This has a more subacute to chronic appearance  without significant vein distension or perivenous strandy edema. Review of the MIP images confirms the above findings. Nonvascular findings: Lower chest: Minor basilar atelectasis. Right middle lobe noncalcified 3 mm nodule noted, image 18 series 7. Atherosclerosis of the thoracic aorta and native coronary vasculature. Mild cardiac enlargement no pericardial effusion. Trace right pleural effusion. Hepatic biliary: Marked hypoattenuation of the liver compatible with hepatic steatosis. Cholecystectomy noted. No biliary dilatation or obstruction. Pancreas: Postop changes from previous Whipple procedure. Atrophy of the pancreas body and tail. Pancreatic stent noted in the head and body region. No focal abnormality or surrounding inflammatory process. No surrounding fluid collection. Stable 12 mm cystic area at the pancreatic tail. Spleen:  Normal in size.  No focal abnormality. Adrenals and urinary tract: Normal adrenal glands. No focal renal abnormality, obstruction, hydronephrosis or perinephric inflammatory process. Ureters are symmetric and decompressed. No obstructing ureteral calculus. Urinary bladder under distended. Stomach and bowel: Previous gastrostomy removed. Postop changes of the stomach with a gastric jejunal bypass. Negative for bowel obstruction. No significant dilatation, ileus, or free air. Moderate colonic stool burden. Normal appendix demonstrated. No ascites, fluid collection, or abscess. Lymphatics:  No adenopathy. Reproductive: Remote hysterectomy. No adnexal mass or pelvic free fluid. Other: Resolving left lower abdominal rectus sheath hematoma. Residual chronic hematoma measures 4.5 x 3.0 cm of the left rectus sheath image 79 series 3. Musculoskeletal: Degenerative changes noted spine. Previous fusion L4-5. Facet arthropathy noted at multiple levels. 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 IVC above the filter is  well opacified and patent. Atherosclerosis as above Hepatic steatosis Bibasilar atelectasis Postop changes of the abdomen as above without bowel obstruction para Resolving left inferior rectus sheath hematoma 3 mm right middle lobe pulmonary nodule No follow-up needed if patient is low-risk. Non-contrast chest CT can be considered in 12 months if patient is high-risk. This recommendation follows the consensus statement: Guidelines for Management of Incidental Pulmonary Nodules Detected on CT Images: From the Fleischner Society 2017; Radiology 2017; 284:228-243. Electronically Signed   By: Jerilynn Mages.  Shick M.D.   On: 08/03/2017 15:43      Scheduled Meds: . carvedilol  25 mg Oral BID  . furosemide  40 mg Oral BID  . insulin aspart  0-9 Units Subcutaneous TID WC  . insulin detemir  6 Units Subcutaneous BID  . irbesartan  150 mg Oral Daily  . levothyroxine  200 mcg Oral QAC breakfast  . pantoprazole  40 mg Oral BID  . potassium chloride  40 mEq Oral Daily  . sodium chloride flush  3 mL Intravenous Q12H  . warfarin  7.5 mg Oral ONCE-1800  . Warfarin - Pharmacist Dosing Inpatient   Does not apply q1800   Continuous Infusions: . sodium chloride    . heparin 1,100 Units/hr (08/03/17 1824)     LOS: 5 days    Time spent: Total of  25 minutes spent with pt, greater than 50% of which was spent in discussion of  treatment, counseling and coordination of care    Chipper Oman, MD Pager: Text Page via [email or web address]   If 7PM-7AM, please contact night-coverage [email or web address] 08/04/2017, 4:57 PM

## 2017-08-04 NOTE — Progress Notes (Signed)
Chief Complaint: Patient was seen today for follow up of inferior vena cava and bilateral lower extremity DVT   Supervising Physician: Dr. Earleen Newport  Patient Status: [facility] - In-pt  Subjective: Pt remains on heparin. She is sitting on side of bed. Reports she feels better today than yesterday. She also reports the edema/tightness in her thighs is markedly improved. She still reports swelling below her knees and into her feet.  Objective: Physical Exam: BP (!) 138/54 (BP Location: Left Arm)   Pulse 80   Temp 98.3 F (36.8 C) (Oral)   Resp 16   Ht 5\' 4"  (1.626 m)   Wt 208 lb (94.3 kg)   SpO2 99%   BMI 35.70 kg/m  Abd: soft, ND, NT Ext: (B)thigh soft, no appreciable edema 1-2+ edema from knee down bilaterally Calves NT Feet warm   Current Facility-Administered Medications:  .  0.9 %  sodium chloride infusion, 250 mL, Intravenous, PRN, Dunn, Dayna N, PA-C .  carvedilol (COREG) tablet 25 mg, 25 mg, Oral, BID, Dunn, Dayna N, PA-C, 25 mg at 08/04/17 0930 .  cloNIDine (CATAPRES) tablet 0.1 mg, 0.1 mg, Oral, TID PRN, Erlene Quan, PA-C, 0.1 mg at 08/03/17 2225 .  furosemide (LASIX) tablet 40 mg, 40 mg, Oral, BID, Camnitz, Will Hassell Done, MD, 40 mg at 08/04/17 0930 .  heparin ADULT infusion 100 units/mL (25000 units/260mL sodium chloride 0.45%), 1,100 Units/hr, Intravenous, Continuous, Laren Everts, RPH, Last Rate: 11 mL/hr at 08/03/17 1824, 1,100 Units/hr at 08/03/17 1824 .  insulin aspart (novoLOG) injection 0-9 Units, 0-9 Units, Subcutaneous, TID WC, Dunn, Dayna N, PA-C, 2 Units at 08/04/17 [phone] .  insulin detemir (LEVEMIR) injection 6 Units, 6 Units, Subcutaneous, BID, Daune Perch, NP, 6 Units at 08/04/17 0931 .  irbesartan (AVAPRO) tablet 150 mg, 150 mg, Oral, Daily, Daune Perch, NP, 150 mg at 08/04/17 0930 .  levothyroxine (SYNTHROID, LEVOTHROID) tablet 200 mcg, 200 mcg, Oral, QAC breakfast, Dunn, Dayna N, PA-C, 200 mcg at 08/04/17 0930 .  ondansetron (ZOFRAN)  injection 4 mg, 4 mg, Intravenous, Q6H PRN, Dunn, Dayna N, PA-C .  pantoprazole (PROTONIX) EC tablet 40 mg, 40 mg, Oral, BID, Dunn, Dayna N, PA-C, 40 mg at 08/04/17 0930 .  potassium chloride SA (K-DUR,KLOR-CON) CR tablet 40 mEq, 40 mEq, Oral, Daily, Daune Perch, NP, 40 mEq at 08/04/17 0930 .  sodium chloride flush (NS) 0.9 % injection 3 mL, 3 mL, Intravenous, Q12H, Dunn, Dayna N, PA-C, 3 mL at 08/02/17 2155 .  sodium chloride flush (NS) 0.9 % injection 3 mL, 3 mL, Intravenous, PRN, Dunn, Dayna N, PA-C .  warfarin (COUMADIN) tablet 7.5 mg, 7.5 mg, Oral, ONCE-1800, Patrecia Pour, Christean Grief, MD .  Warfarin - Pharmacist Dosing Inpatient, , Does not apply, q1800, Reginia Naas, [facility]  Labs: CBC Recent Labs    08/03/17 0438 08/04/17 0239  WBC 6.7 6.4  HGB 11.1* 10.8*  HCT 34.7* 33.7*  PLT 248 249   BMET Recent Labs    08/03/17 0438 08/04/17 0239  NA 139 137  K 3.5 3.2*  CL 105 105  CO2 26 23  GLUCOSE 175* 208*  BUN 14 12  CREATININE 1.21* 1.14*  CALCIUM 8.6* 8.4*   LFT No results for input(s): PROT, ALBUMIN, AST, ALT, ALKPHOS, BILITOT, BILIDIR, IBILI, LIPASE in the last 72 hours. PT/INR Recent Labs    08/03/17 0438 08/04/17 0239  LABPROT 16.8* 18.7*  INR 1.38 1.57     Studies/Results: Ct Angio Abd/pel W/ And/or W/o  Result Date: 08/03/2017 CLINICAL DATA:  Filter, DVT, assess for clot extension centrally EXAM: CT ANGIOGRAPHY ABDOMEN AND PELVIS TECHNIQUE: Multidetector CT imaging of the abdomen and pelvis was performed using the standard protocol during bolus administration of intravenous contrast. Multiplanar reconstructed images including MIPs were obtained and reviewed to evaluate the vascular anatomy. CONTRAST:  100 cc Isovue 370 COMPARISON:  03/24/2017 FINDINGS: Arterial findings: Aorta: Aortic atherosclerosis without aneurysm, dissection, retroperitoneal hemorrhage, or significant occlusive process. Celiac axis: Heavily calcified origin with ostial narrowing,  difficult to truly assess but estimated greater than 50%. Celiac axis remains patent. Superior mesenteric: Calcified origin but remains patent. Minor narrowing. Left renal: Heavily calcified with ostial narrowing, difficult to truly assess degree of stenosis. Right renal: Minor calcified origin. No significant narrowing. Inferior mesenteric:  Remains patent off of the distal aorta. Left iliac: Left common, internal external iliac arteries are calcified but remain patent. No inflow disease. Right iliac: Right common, internal and external iliac arteries are patent. Mild atherosclerosis and tortuosity. No inflow disease. Venous findings: Hepatic, portal, mesenteric, splenic, and renal veins are all patent. Suprarenal IVC and hepatic IVC are patent. Infrarenal IVC filter noted terminates at the bifurcation. Below the filter the study is limited but there does appear to be hypodense thrombus in the IVC below the filter extending into the iliac veins to some degree. This has a more subacute to chronic appearance without significant vein distension or perivenous strandy edema. Review of the MIP images confirms the above findings. Nonvascular findings: Lower chest: Minor basilar atelectasis. Right middle lobe noncalcified 3 mm nodule noted, image 18 series 7. Atherosclerosis of the thoracic aorta and native coronary vasculature. Mild cardiac enlargement no pericardial effusion. Trace right pleural effusion. Hepatic biliary: Marked hypoattenuation of the liver compatible with hepatic steatosis. Cholecystectomy noted. No biliary dilatation or obstruction. Pancreas: Postop changes from previous Whipple procedure. Atrophy of the pancreas body and tail. Pancreatic stent noted in the head and body region. No focal abnormality or surrounding inflammatory process. No surrounding fluid collection. Stable 12 mm cystic area at the pancreatic tail. Spleen:  Normal in size.  No focal abnormality. Adrenals and urinary tract: Normal  adrenal glands. No focal renal abnormality, obstruction, hydronephrosis or perinephric inflammatory process. Ureters are symmetric and decompressed. No obstructing ureteral calculus. Urinary bladder under distended. Stomach and bowel: Previous gastrostomy removed. Postop changes of the stomach with a gastric jejunal bypass. Negative for bowel obstruction. No significant dilatation, ileus, or free air. Moderate colonic stool burden. Normal appendix demonstrated. No ascites, fluid collection, or abscess. Lymphatics:  No adenopathy. Reproductive: Remote hysterectomy. No adnexal mass or pelvic free fluid. Other: Resolving left lower abdominal rectus sheath hematoma. Residual chronic hematoma measures 4.5 x 3.0 cm of the left rectus sheath image 79 series 3. Musculoskeletal: Degenerative changes noted spine. Previous fusion L4-5. Facet arthropathy noted at multiple levels. 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 IVC above the filter is well opacified and patent. Atherosclerosis as above Hepatic steatosis Bibasilar atelectasis Postop changes of the abdomen as above without bowel obstruction para Resolving left inferior rectus sheath hematoma 3 mm right middle lobe pulmonary nodule No follow-up needed if patient is low-risk. Non-contrast chest CT can be considered in 12 months if patient is high-risk. This recommendation follows the consensus statement: Guidelines for Management of Incidental Pulmonary Nodules Detected on CT Images: From the Fleischner Society 2017; Radiology 2017; 284:228-243. Electronically Signed   By: Jerilynn Mages.  Shick M.D.  On: 08/03/2017 15:43    Assessment/Plan: B LE edema CT veno revealing B iliac vein and lower IVC thrombus Symptoms continue to improve on IV heparin and Coumadin now with marked improvement in thigh edema/tightness. Reviewed options of more aggressive thrombolysis again with pt. Favor continuing  conservative management for now. Will order TEDs to assist with lower leg edema     LOS: 5 days   I spent a total of 20 minutes in face to face in clinical consultation, greater than 50% of which was counseling/coordinating care for IVC and iliac DVT  Ascencion Dike PA-C 08/04/2017 11:50 AM

## 2017-08-04 NOTE — Progress Notes (Signed)
[facility] for heparin and Coumadin Indication: DVT  Allergies  Allergen Reactions  . Hydralazine Hcl Shortness Of Breath    Pt had severe respiratory distress after receiving a dose  . Darvon [Propoxyphene Hcl] Other (See Comments)    Hallucinations   . Nyquil Multi-Symptom [Pseudoeph-Doxylamine-Dm-Apap] Other (See Comments)    Makes pt not "feel right in her head"  . Amlodipine Besylate Swelling  . Nifedipine Er Swelling  . Metformin And Related Diarrhea    Patient Measurements: Height: 5\' 4"  (162.6 cm) Weight: 208 lb (94.3 kg) IBW/kg (Calculated) : 54.7 Heparin Dosing Weight: 76 kg  Assessment: 81 yo F presents on 12/14 with DVT (history of DVT earlier this year but anticoagulation was stopped due to rectus sheath hematoma; IVC placed). Not on any anticoagulation PTA. Pharmacy consulted to dose heparin and coumadin (day 5 of bridge). Patient is also noted with history of cancer.   Heparin level 0.67 at goal on heparin drip rate 1100 uts/hr. No bleeding issues noted.  INR starting to bump 1.57 with higher doses - given history will back down slightly  Goal of Therapy:  Heparin level 0.3-0.7 units/ml  INR 2-3 Monitor platelets by anticoagulation protocol: Yes   Plan:  Continue heparin at 1100 units/hr Monitor daily heparin level / INR, CBC, s/sx bleeding Warfarin 7.5mg  x1  Bonnita Nasuti Pharm.D. CPP, BCPS Clinical Pharmacist [phone] 08/04/2017 11:02 AM

## 2017-08-05 LAB — BASIC METABOLIC PANEL
Anion gap: 7 (ref 5–15)
BUN: 9 mg/dL (ref 6–20)
CO2: 25 mmol/L (ref 22–32)
Calcium: 8.6 mg/dL — ABNORMAL LOW (ref 8.9–10.3)
Chloride: 106 mmol/L (ref 101–111)
Creatinine, Ser: 0.98 mg/dL (ref 0.44–1.00)
GFR calc Af Amer: >60 mL/min (ref >60)
GFR calc non Af Amer: 53 mL/min — ABNORMAL LOW (ref >60)
Glucose, Bld: 187 mg/dL — ABNORMAL HIGH (ref 65–99)
Potassium: 3.4 mmol/L — ABNORMAL LOW (ref 3.5–5.1)
Sodium: 138 mmol/L (ref 135–145)

## 2017-08-05 LAB — HEPARIN LEVEL (UNFRACTIONATED)
Heparin Unfractionated: 0.82 IU/mL — ABNORMAL HIGH (ref 0.30–0.70)
Heparin Unfractionated: 0.77 IU/mL — ABNORMAL HIGH (ref 0.30–0.70)

## 2017-08-05 LAB — PROTIME-INR
INR: 1.85
Prothrombin Time: 21.2 seconds — ABNORMAL HIGH (ref 11.4–15.2)

## 2017-08-05 LAB — GLUCOSE, CAPILLARY
Glucose-Capillary: 183 mg/dL — ABNORMAL HIGH (ref 65–99)
Glucose-Capillary: 178 mg/dL — ABNORMAL HIGH (ref 65–99)
Glucose-Capillary: 204 mg/dL — ABNORMAL HIGH (ref 65–99)

## 2017-08-05 LAB — T3: T3, Total: 52 ng/dL — ABNORMAL LOW (ref 71–180)

## 2017-08-05 IMAGING — CR DG LUMBAR SPINE 2-3V
2 series · 2 of 2 positions shown · non-contrast
Comparison: 12/18/2015

CLINICAL DATA: L3-4 laminectomy.

EXAM:
LUMBAR SPINE - 2-3 VIEW

[lat (1 of 2)]
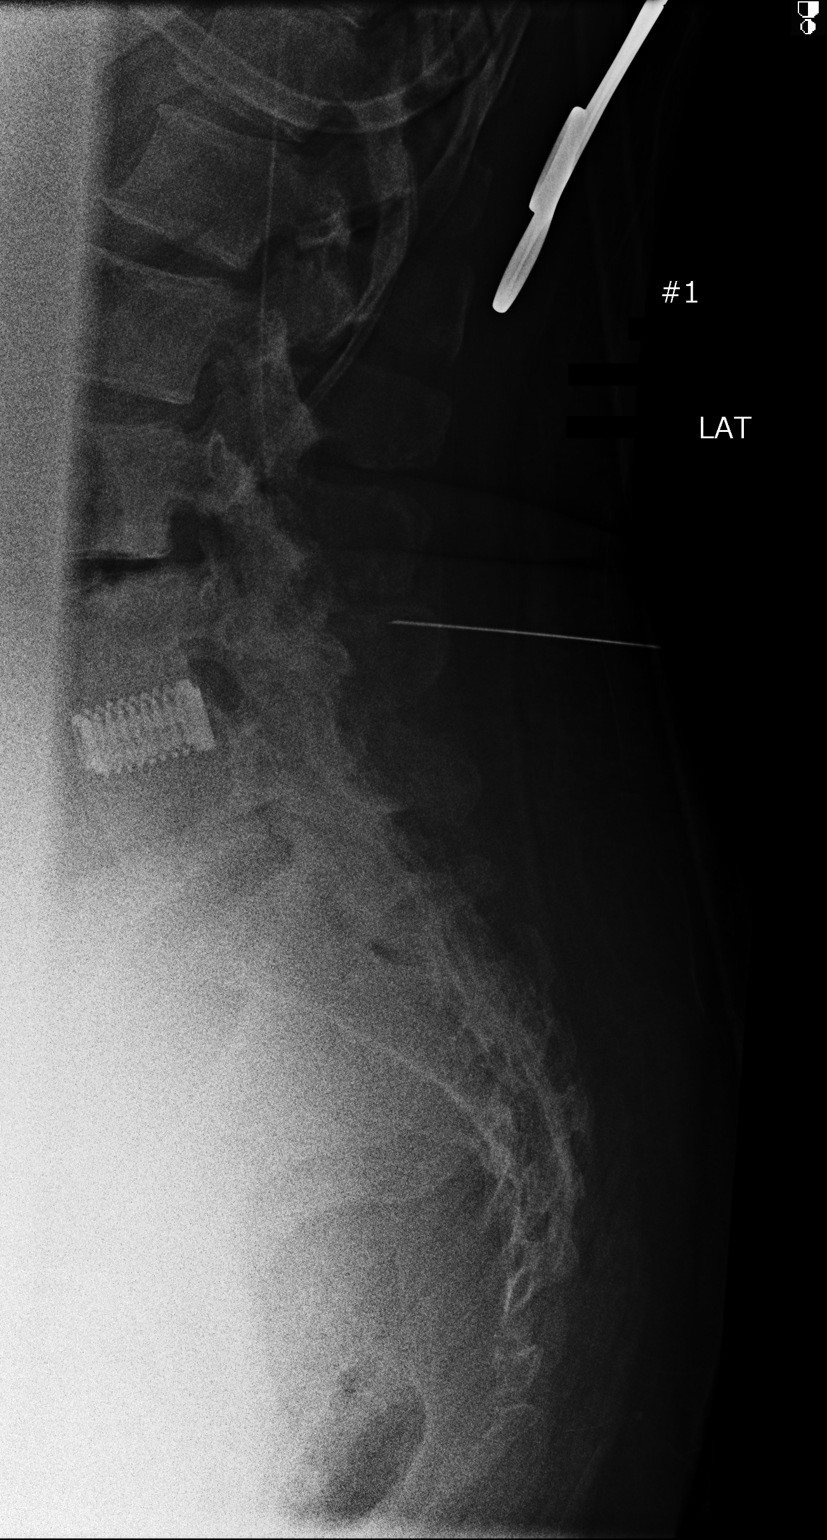

[lat (2 of 2)]
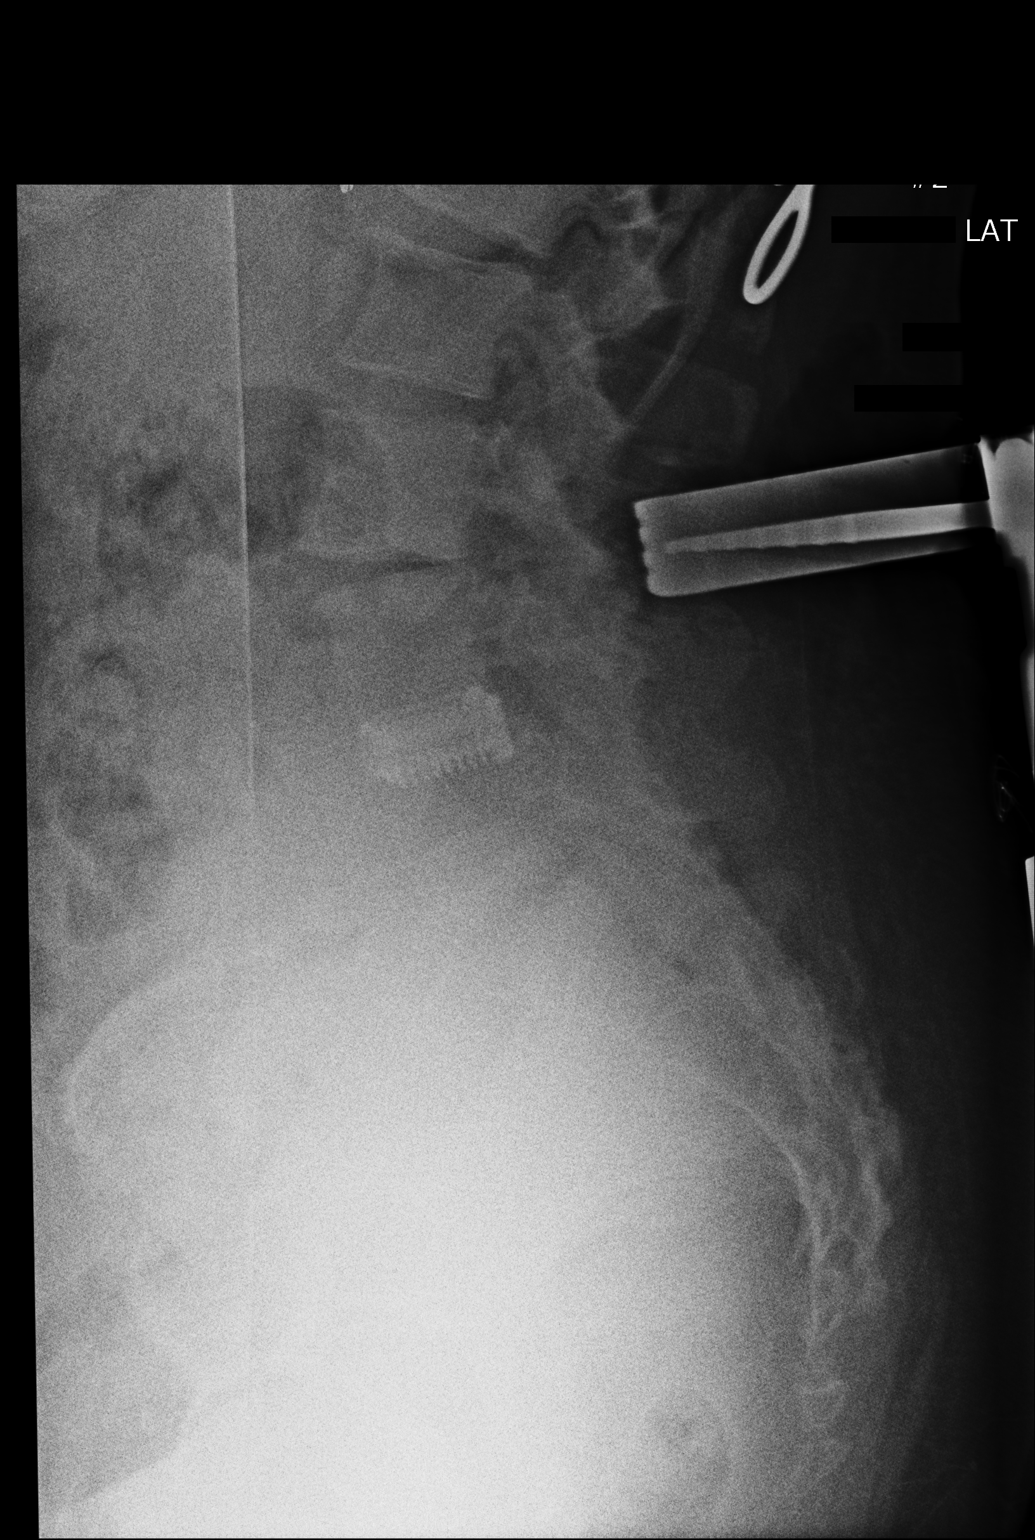

[2 of 2 positions shown; findings below may reference images not displayed]

FINDINGS: Examination demonstrates stable subtle grade 1 anterolisthesis of L3
on L4 with moderate facet arthropathy over the mid to lower lumbar
spine. Mild spondylosis is present. Intervertebral cages present at
the L4-5 level. Disc space narrowing at the L3-4 level. A metallic
needle is present with tip projected over the superior aspect of the
L4 spinous process. Subsequent film demonstrates surgical instrument
with tip over the posterior elements at the L3-4 level.
IMPRESSION: Mild spondylosis of the lumbar spine with stable grade 1
anterolisthesis of L3 on L4. Disc space narrowing at the L3-4 level
and intervertebral cage at the L4-5 level. Surgical instrument over
the posterior elements at the L3-4 level.

## 2017-08-05 MED ORDER — WARFARIN SODIUM 7.5 MG PO TABS
7.5000 mg | ORAL_TABLET | Freq: Once | ORAL | Status: AC
  Administered 2017-08-05: 7.5 mg via ORAL
  Filled 2017-08-05: qty 1

## 2017-08-05 MED ORDER — IRBESARTAN 300 MG PO TABS
300.0000 mg | ORAL_TABLET | Freq: Every day | ORAL | Status: DC
  Administered 2017-08-06: 300 mg via ORAL
  Filled 2017-08-05: qty 1

## 2017-08-05 NOTE — Progress Notes (Signed)
ANTICOAGULATION CONSULT NOTE - Follow Up Consult  Pharmacy Consult for heparin Indication: DVT  Labs: Recent Labs    08/03/17 0438 08/03/17 1919 08/04/17 0239 08/05/17 0538  HGB 11.1*  --  10.8*  --   HCT 34.7*  --  33.7*  --   PLT 248  --  249  --   LABPROT 16.8*  --  18.7* 21.2*  INR 1.38  --  1.57 1.85  HEPARINUNFRC 0.66 0.59 0.67 0.82*  CREATININE 1.21*  --  1.14* 0.98    Assessment: 81yo female supratherapeutic on heparin after several level at goal though had been trending up..   Goal of Therapy:  Heparin level 0.3-0.7 units/ml   Plan:  Will decrease heparin gtt by 1 unit/kg/hr to 1000 units/hr and check level in 8 hours.   Wynona Neat, PharmD, BCPS  08/05/2017,6:39 AM

## 2017-08-05 NOTE — Progress Notes (Addendum)
PROGRESS NOTE [facility]   Kaylee Mullins   UVO:[id number] DOB: [birth year]  DOA: 07/30/2017 PCP: Nolene Ebbs, MD   Brief Narrative:  Kaylee Mullins is a 81 y.o. female with medical history significant for ampullary carcinoma status post Whipple procedure in 2018, diabetes mellitus on insulin, history of right renal cell carcinoma status post ablation, stage III chronic kidney disease, hypertension, hypothyroidism, diastolic heart failure and history of DVT.  Historically patient underwent Whipple procedure on 01/12/17 with postoperative severe gastroparesis and ileus. She was diagnosed with a left popliteal DVT in June 2018 and started on anticoagulation with Lovenox given underlying malignancy at the time.  She had a protracted hospitalization in late June/July through August secondary to recurrent issues of gastroparesis and prolonged ileus requiring at least one EGD and one exploratory laparotomy procedure.  During that hospitalization she developed a large left rectus sheath hematoma measuring 8.4 x 8.7 x 23.2 cm (estimated volume 865mL).  Anticoagulation was stopped and an IVC filter was placed on 03/03/17. Subsequently patient developed LE edema which was treated with high dose Lasix. LE doppler was performed and showed bilateral extensive DVT and patient was admitted by cardiology services. Patient was started on heparin bridge with warfarin. 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 IVC above the filter patent. Patient awaiting therapeutic INR.   Subjective: Leg swelling continues to improve, no new complaints. INR 1.8 today   Assessment & Plan: DVT lower extremities, below IVC filter thrombus  IR recommendations appreciated, symptoms improving with standard treatment no surgical intervention at this time. Continue heparin bridge to warfarin. INR goal 2-3, expect INR close or above in AM .  Lower extremity edema improved, TED Hose   Chronic diastolic CHF/ Paroxymal SVT  Seem compensated at this time, HR stable.  Continue current regimen   DM type 2 uncontrolled with hyperglycemia A1C 8.4 improved form 9.9 12/2016 Will adjust Levamir to 7 units BID, fasting glucose not at goal  Continue SSI   CKD stage 3 Cr stable  Continue to monitor   HTN  BP slight elevated   Will increase Avapro  Continue Coreg  Monitor   Hypothyroidism  TSH elevated, T4 normal - subclinical  Continue Synthroid at current dose and check TSH in 6 weeks   DVT prophylaxis: Warfarin  Code Status: Full code  Family Communication: None at bedside  Disposition Plan: Home in the next 24-48 hrs   Consultants:   IR  Cardiology   Procedures:   None   Antimicrobials:  None     Objective: Vitals:   08/05/17 0000 08/05/17 0425 08/05/17 0815 08/05/17 1618  BP: (!) 166/78 (!) 153/65 122/61 (!) 192/70  Pulse:   (!) 51 (!) 50  Resp: (!) 22 20 17 18   Temp:  97.6 F (36.4 C) 97.7 F (36.5 C) 98.4 F (36.9 C)  TempSrc:  Oral Oral Oral  SpO2:  96% 100% 100%  Weight:  91.8 kg (202 lb 4.8 oz)    Height:        Intake/Output Summary (Last 24 hours) at 08/05/2017 1656 Last data filed at 08/05/2017 1200 Gross per 24 hour  Intake 1008.27 ml  Output 1551 ml  Net -542.73 ml   Filed Weights   08/02/17 0512 08/03/17 0439 08/05/17 0425  Weight: 94 kg (207 lb 4.8 oz) 94.3 kg (208 lb) 91.8 kg (202 lb 4.8 oz)    Examination:  General:  NAD, sitting up in chair  Cardiovascular: RRR, S1/S2 +, no rubs, no gallops Respiratory: CTA bilaterally, no wheezing, no rhonchi Abdominal: Soft, NT, ND, bowel sounds + Extremities: B/L LE edema improving   Data Reviewed: I have personally reviewed following labs and imaging studies  CBC: Recent Labs  Lab 07/31/17 0251 07/31/17 2359 08/02/17 0256 08/03/17 0438 08/04/17 0239  WBC 5.7 6.3 6.2 6.7 6.4  HGB 11.2* 10.8* 10.8* 11.1* 10.8*  HCT 33.9*  33.3* 33.7* 34.7* 33.7*  MCV 95.5 95.7 95.7 95.6 95.2  PLT 251 236 243 248 428   Basic Metabolic Panel: Recent Labs  Lab 07/31/17 2359 08/02/17 0256 08/03/17 0438 08/04/17 0239 08/05/17 0538  NA 136 137 139 137 138  K 3.3* 3.3* 3.5 3.2* 3.4*  CL 101 103 105 105 106  CO2 27 25 26 23 25   GLUCOSE 271* 240* 175* 208* 187*  BUN 15 11 14 12 9   CREATININE 1.28* 1.22* 1.21* 1.14* 0.98  CALCIUM 8.0* 8.2* 8.6* 8.4* 8.6*   GFR: Estimated Creatinine Clearance: 51.1 mL/min (by C-G formula based on SCr of 0.98 mg/dL). Liver Function Tests: No results for input(s): AST, ALT, ALKPHOS, BILITOT, PROT, ALBUMIN in the last 168 hours. No results for input(s): LIPASE, AMYLASE in the last 168 hours. No results for input(s): AMMONIA in the last 168 hours. Coagulation Profile: Recent Labs  Lab 07/31/17 2359 08/02/17 0256 08/03/17 0438 08/04/17 0239 08/05/17 0538  INR 1.26 1.22 1.38 1.57 1.85   Cardiac Enzymes: No results for input(s): CKTOTAL, CKMB, CKMBINDEX, TROPONINI in the last 168 hours. BNP (last 3 results) No results for input(s): PROBNP in the last 8760 hours. HbA1C: Recent Labs    08/03/17 0438  HGBA1C 8.4*   CBG: Recent Labs  Lab 08/04/17 1107 08/04/17 1649 08/04/17 2137 08/05/17 0609 08/05/17 1139  GLUCAP 192* 221* 203* 183* 178*   Lipid Profile: No results for input(s): CHOL, HDL, LDLCALC, TRIG, CHOLHDL, LDLDIRECT in the last 72 hours. Thyroid Function Tests: Recent Labs    08/03/17 0904  TSH 24.002*  FREET4 0.87   Anemia Panel: No results for input(s): VITAMINB12, FOLATE, FERRITIN, TIBC, IRON, RETICCTPCT in the last 72 hours. Sepsis Labs: No results for input(s): PROCALCITON, LATICACIDVEN in the last 168 hours.  No results found for this or any previous visit (from the past 240 hour(s)).    Radiology Studies: No results found.    Scheduled Meds: . carvedilol  25 mg Oral BID  . furosemide  40 mg Oral BID  . insulin aspart  0-9 Units Subcutaneous  TID WC  . insulin detemir  6 Units Subcutaneous BID  . irbesartan  150 mg Oral Daily  . levothyroxine  200 mcg Oral QAC breakfast  . pantoprazole  40 mg Oral BID  . potassium chloride  40 mEq Oral Daily  . sodium chloride flush  3 mL Intravenous Q12H  . Warfarin - Pharmacist Dosing Inpatient   Does not apply q1800   Continuous Infusions: . sodium chloride    . heparin 900 Units/hr (08/05/17 1545)     LOS: 6 days    Time spent: Total of 15 minutes spent with pt, greater than 50% of which was spent in discussion of  treatment, counseling and coordination of care    Chipper Oman, MD Pager: Text Page via [email or web address]   If 7PM-7AM, please contact night-coverage [email or web address] 08/05/2017, 4:56 PM

## 2017-08-05 NOTE — Progress Notes (Addendum)
[place] for heparin and Coumadin Indication: DVT  Allergies  Allergen Reactions  . Hydralazine Hcl Shortness Of Breath    Pt had severe respiratory distress after receiving a dose  . Darvon [Propoxyphene Hcl] Other (See Comments)    Hallucinations   . Nyquil Multi-Symptom [Pseudoeph-Doxylamine-Dm-Apap] Other (See Comments)    Makes pt not "feel right in her head"  . Amlodipine Besylate Swelling  . Nifedipine Er Swelling  . Metformin And Related Diarrhea    Patient Measurements: Height: 5\' 4"  (162.6 cm) Weight: 202 lb 4.8 oz (91.8 kg) IBW/kg (Calculated) : 54.7 Heparin Dosing Weight: 76 kg  Assessment: 81 yo F presents on 12/14 with DVT (history of DVT earlier this year but anticoagulation was stopped due to rectus sheath hematoma; IVC placed). Not on any anticoagulation PTA. Pharmacy consulted to dose heparin and coumadin (completed 5 days of overlap). Patient is also noted with history of cancer. CBC stable. No bleed documented.  Heparin decreased for high level this AM - repeat level pending. INR trending up to 1.85. Given her history, have backed down slightly on doses.  Goal of Therapy:  Heparin level 0.3-0.7 units/ml  INR 2-3 Monitor platelets by anticoagulation protocol: Yes   Plan:  Heparin reduced to 1000 units/hr this AM Warfarin 7.5mg  x1 1430 heparin level Monitor daily heparin level / INR, CBC, s/sx bleeding   [name], PharmD, BCPS Clinical Pharmacist Clinical phone for 08/05/2017 until 3:30pm: [phone] If after 3:30pm, please call main pharmacy at: [phone] 08/05/2017 9:26 AM   ADDENDUM:  Heparin level decreased but remains slightly high at 0.77 after rate decrease. No bleed or IV line issues per RN.  Plan: Reduce heparin to 900 units/hr 8h heparin level Daily heparin level/CBC Monitor for s/sx bleeding  [name], PharmD, BCPS Clinical Pharmacist 08/05/2017 3:34 PM

## 2017-08-05 NOTE — Plan of Care (Signed)
  Clinical Measurements: Will remain free from infection 08/05/2017 2314 - Completed/Met by Peggye Pitt, RN   Safety: Ability to remain free from injury will improve 08/05/2017 2314 - Completed/Met by Peggye Pitt, RN   Skin Integrity: Risk for impaired skin integrity will decrease 08/05/2017 2314 - Completed/Met by Peggye Pitt, RN

## 2017-08-05 NOTE — Plan of Care (Signed)
  Clinical Measurements: Ability to maintain clinical measurements within normal limits will improve 08/05/2017 0427 - Progressing by Jaymes Graff, RN Pt BPs run high, improved after PRN clonidine dose

## 2017-08-06 LAB — GLUCOSE, CAPILLARY
Glucose-Capillary: 145 mg/dL — ABNORMAL HIGH (ref 65–99)
Glucose-Capillary: 142 mg/dL — ABNORMAL HIGH (ref 65–99)
Glucose-Capillary: 185 mg/dL — ABNORMAL HIGH (ref 65–99)

## 2017-08-06 LAB — PROTIME-INR
INR: 1.99
INR: 2.02
Prothrombin Time: 22.5 seconds — ABNORMAL HIGH (ref 11.4–15.2)
Prothrombin Time: 22.7 seconds — ABNORMAL HIGH (ref 11.4–15.2)

## 2017-08-06 LAB — HEPARIN LEVEL (UNFRACTIONATED)
Heparin Unfractionated: 0.56 IU/mL (ref 0.30–0.70)
Heparin Unfractionated: 0.58 IU/mL (ref 0.30–0.70)

## 2017-08-06 MED ORDER — INSULIN DETEMIR 100 UNIT/ML ~~LOC~~ SOLN: 7.0000 [IU] | Freq: Two times a day (BID) | SUBCUTANEOUS | 0 refills | Status: DC

## 2017-08-06 MED ORDER — WARFARIN SODIUM 7.5 MG PO TABS: 7.5000 mg | ORAL_TABLET | Freq: Once | ORAL | Status: DC

## 2017-08-06 MED ORDER — INSULIN DETEMIR 100 UNIT/ML ~~LOC~~ SOLN: 6.0000 [IU] | Freq: Two times a day (BID) | SUBCUTANEOUS | 11 refills | Status: DC

## 2017-08-06 MED ORDER — WARFARIN SODIUM 7.5 MG PO TABS: 7.5000 mg | ORAL_TABLET | Freq: Once | ORAL | 0 refills | Status: DC

## 2017-08-06 NOTE — Care Management Important Message (Signed)
Important Message  Patient Details  Name: Kaylee Mullins MRN: [id number] Date of Birth: [birth year]   Medicare Important Message Given:  Yes    Nathen May 08/06/2017, 9:57 AM

## 2017-08-06 NOTE — Progress Notes (Signed)
[place] for Heparin/Warfarin  Indication: DVT  Allergies  Allergen Reactions  . Hydralazine Hcl Shortness Of Breath    Pt had severe respiratory distress after receiving a dose  . Darvon [Propoxyphene Hcl] Other (See Comments)    Hallucinations   . Nyquil Multi-Symptom [Pseudoeph-Doxylamine-Dm-Apap] Other (See Comments)    Makes pt not "feel right in her head"  . Amlodipine Besylate Swelling  . Nifedipine Er Swelling  . Metformin And Related Diarrhea    Patient Measurements: Height: 5\' 4"  (162.6 cm) Weight: 202 lb 4.8 oz (91.8 kg) IBW/kg (Calculated) : 54.7 Heparin Dosing Weight: 76 kg  Assessment: 81 yo F presents on 12/14 with DVT (history of DVT earlier this year but anticoagulation was stopped due to rectus sheath hematoma; IVC placed). Not on any anticoagulation PTA. Pharmacy consulted to dose heparin and coumadin (completed 5 days of overlap). Patient is also noted with history of cancer. CBC stable. No bleed documented.  Heparin level therapeutic x 1 after rate decrease  Goal of Therapy:  Heparin level 0.3-0.7 units/ml  INR 2-3 Monitor platelets by anticoagulation protocol: Yes   Plan:  Cont heparin at 900 units/hr Confirmatory heparin level with AM labs  Narda Bonds, PharmD, [place] Pharmacist Phone: [phone]

## 2017-08-06 NOTE — Care Management Note (Signed)
Case Management Note Marvetta Gibbons RN, BSN Unit 4E-Case Manager [phone]  Patient Details  Name: Kaylee Mullins MRN: [id number] Date of Birth: Sep 11, 1935  Subjective/Objective: Pt admitted with recurrent bil DVTs                 Action/Plan: PTA pt lived at home- active with [facility] for HHRN/PT- if pt returns home will need resumption orders to resume [facility] services at time of discharge.   Expected Discharge Date:  08/06/17               Expected Discharge Plan:  Home/Self Care  In-House Referral:  NA  Discharge planning Services  CM Consult, Follow-up appt scheduled  Post Acute Care Choice:  Home Health, Resumption of Svcs/PTA ProviderHome Health, resumption of services Choice offered to:  Patient  DME Arranged:    DME Agency:     HH Arranged:  RN, PT [place] Agency:  [place]  Status of Service:  Completed, signed offIn progress  If discussed at [place] of Stay Meetings, dates discussed:    Discharge Disposition: home/home health   Additional Comments:  08/06/17- 1530 -Marvetta Gibbons RN, CM- pt for d/c home today- call made to coumadin clinic for INR check- per clinic pt has appointment for 12/27 at 10:30 with pharmacist there and they want pt to keep that appointment and they will do INR at the same time.-  Pt was active with [facility] for HHRN/PT- MD to place resumption orders for discharge. Butch Penny with [facility] aware of discharge and will f/u on resumption of [facility] services  Dawayne Patricia, [place] 08/06/2017, 3:40 PM

## 2017-08-06 NOTE — Discharge Summary (Addendum)
Physician Discharge Summary  [name]  NUU:[id number]  DOB: 13-Jan-1936  DOA: 07/30/2017 PCP: Nolene Ebbs, MD  Admit date: 07/30/2017 Discharge date: 08/06/2017  Admitted From: Home  Disposition:  Home   Recommendations for Outpatient Follow-up:  1. Follow up with PCP in 1-2 weeks 2. Please obtain BMP/CBC in one week to monitor Cr and Hgb  3. Check INR in 5 days   Home Health: PT   Discharge Condition: Stable   CODE STATUS: Full Code   Diet recommendation: Heart Healthy   Brief/Interim Summary: Kaylee Mullins is a 81 y.o.femalewith medical history significant forampullary carcinoma status post Whipple procedure in 2018, diabetes mellitus on insulin, history of right renal cell carcinoma status post ablation, stage III chronic kidney disease, hypertension, hypothyroidism, diastolic heart failure and history of DVT. Historically patient underwent Whipple procedure on 01/12/17 with postoperative severe gastroparesis and ileus. She was diagnosed with a left popliteal DVT in June 2018 and started on anticoagulation with Lovenox given underlying malignancy at the time. She had a protracted hospitalization in late June/July through August secondary to recurrent issues of gastroparesis and prolonged ileus requiring at least one EGD andoneexploratory laparotomy procedure. During that hospitalization she developed a large left rectus sheath hematoma measuring 8.4 x 8.7 x 23.2 cm (estimated volume 877mL).Anticoagulation was stopped and an IVC filter was placed on 03/03/17. Subsequently patient developed LE edema which was treated with high dose Lasix. LE doppler was performed and showed bilateral extensive DVT and patient was admitted by cardiology services. Patient was started on heparin bridge with warfarin. 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 IVC above the filter patent. INR became  therapeutic. IR recommending conervative treatment for now. Patient clinically improved and is discharge home with PCP follow up    Subjective: Patient seen and examined, no new complaints. Leg swelling continues to decrease. No acute events.   Discharge Diagnoses/Hospital Course:  DVT lower extremities, below IVC filter thrombus  IR recommendations appreciated, symptoms improving with standard treatment no surgical intervention at this time. Was treated with heparin bridge to warfarin. INR currently 2.02. Lower extremity edema improved, continue TED Hose. Follow up with IR in 2 weeks   Chronic diastolic CHF/ Paroxymal SVT  Seem compensated at this time, HR stable.  Continue home medications with no changes  Follow up with cardioloy   DM type 2 uncontrolled with hyperglycemia A1C 8.4 improved form 9.9 12/2016 Patient was started on Levamir to 7 BID, fasting glucose improved  Follow with PCP   CKD stage 3 Cr stable  Continue to monitor   HTN  BP still slight above goal - asymptomatic  Avapro increased   Continue Coreg  Resume Procardia  Follow up with PCP   Hypothyroidism  TSH elevated, T4 normal - subclinical  Continue Synthroid at current dose and check TSH in 6 weeks   All other chronic medical condition were stable during the hospitalization.  On the day of the discharge the patient's vitals were stable, and no other acute medical condition were reported by patient. the patient was felt safe to be discharge to home   Discharge Instructions  You were cared for by a hospitalist during your hospital stay. If you have any questions about your discharge medications or the care you received while you were in the hospital after you are discharged, you can call the unit and asked to speak with the hospitalist on call  if the hospitalist that took care of you is not available. Once you are discharged, your primary care physician will handle any further medical issues. Please note  that NO REFILLS for any discharge medications will be authorized once you are discharged, as it is imperative that you return to your primary care physician (or establish a relationship with a primary care physician if you do not have one) for your aftercare needs so that they can reassess your need for medications and monitor your lab values.  Discharge Instructions    Call MD for:  difficulty breathing, headache or visual disturbances   Complete by:  As directed    Call MD for:  extreme fatigue   Complete by:  As directed    Call MD for:  hives   Complete by:  As directed    Call MD for:  persistant dizziness or light-headedness   Complete by:  As directed    Call MD for:  persistant nausea and vomiting   Complete by:  As directed    Call MD for:  redness, tenderness, or signs of infection (pain, swelling, redness, odor or green/yellow discharge around incision site)   Complete by:  As directed    Call MD for:  severe uncontrolled pain   Complete by:  As directed    Call MD for:  temperature >100.4   Complete by:  As directed    Diet - low sodium heart healthy   Complete by:  As directed    Increase activity slowly   Complete by:  As directed      Allergies as of 08/06/2017      Reactions   Hydralazine Hcl Shortness Of Breath   Pt had severe respiratory distress after receiving a dose   Darvon [propoxyphene Hcl] Other (See Comments)   Hallucinations   Nyquil Multi-symptom [pseudoeph-doxylamine-dm-apap] Other (See Comments)   Makes pt not "feel right in her head"   Amlodipine Besylate Swelling   Nifedipine Er Swelling   Metformin And Related Diarrhea      Medication List    STOP taking these medications   clopidogrel 75 MG tablet Commonly known as:  PLAVIX   metolazone 2.5 MG tablet Commonly known as:  ZAROXOLYN   tpn solution (CLINIMIX 5/15) *NO Electrolytes* 5 % Soln     TAKE these medications   acetaminophen 500 MG tablet Commonly known as:  TYLENOL Take 500  mg by mouth every 6 (six) hours as needed for mild pain.   B-D UF III MINI PEN NEEDLES 31G X 5 MM Misc Generic drug:  Insulin Pen Needle USE AS DIRECTED WITH HUMALOG   carvedilol 25 MG tablet Commonly known as:  COREG Take 1 tablet (25 mg total) by mouth 2 (two) times daily.   furosemide 40 MG tablet Commonly known as:  LASIX Take 40 mg by mouth daily.   HUMALOG KWIKPEN 100 UNIT/ML KiwkPen Generic drug:  insulin lispro USE SLIDING SCALE EVERY 4 HOURS BASED ON BLOOD SUGAR READINGS, SLIDING SCALE INCLUDED   insulin detemir 100 UNIT/ML injection Commonly known as:  LEVEMIR Inject 0.07 mLs (7 Units total) into the skin 2 (two) times daily.   irbesartan 300 MG tablet Commonly known as:  AVAPRO Take 1 tablet (300 mg total) by mouth daily.   levothyroxine 200 MCG tablet Commonly known as:  SYNTHROID, LEVOTHROID Take 200 mcg by mouth daily before breakfast. For hypothyroidism   NIFEdipine 30 MG 24 hr tablet Commonly known as:  PROCARDIA-XL/ADALAT-CC/NIFEDICAL-XL Take 30 mg  by mouth daily.   pantoprazole 40 MG tablet Commonly known as:  PROTONIX Take 1 tablet (40 mg total) by mouth at bedtime. What changed:  when to take this   warfarin 7.5 MG tablet Commonly known as:  COUMADIN Take 1 tablet (7.5 mg total) by mouth one time only at 6 PM.      Follow-up Information    Nolene Ebbs, MD. Schedule an appointment as soon as possible for a visit in 1 week(s).   Specialty:  Internal Medicine Why:  Hospital follow up  Contact information: [address] [place] [address] [facility]. Go on 08/12/2017.   Why:  f/u with your appointment with pharmacist who will also do your INR check-appointment time 10:30 Contact information: [place]       Sueanne Margarita, MD. Schedule an appointment as soon as possible for a visit in 2 week(s).   Specialty:  Cardiology Why:  Hospital  follow up  Contact information: [address] [place] [address] [phone]          Allergies  Allergen Reactions  . Hydralazine Hcl Shortness Of Breath    Pt had severe respiratory distress after receiving a dose  . Darvon [Propoxyphene Hcl] Other (See Comments)    Hallucinations   . Nyquil Multi-Symptom [Pseudoeph-Doxylamine-Dm-Apap] Other (See Comments)    Makes pt not "feel right in her head"  . Amlodipine Besylate Swelling  . Nifedipine Er Swelling  . Metformin And Related Diarrhea    Consultations: Cardiology  IR   Procedures/Studies: Ct Angio Chest Pe W Or Wo Contrast  Result Date: 07/30/2017 CLINICAL DATA:  Dyspnea. 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 EXAM: CT ANGIOGRAPHY CHEST WITH CONTRAST TECHNIQUE: Multidetector CT imaging of the chest was performed using the standard protocol during bolus administration of intravenous contrast. Multiplanar CT image reconstructions and MIPs were obtained to evaluate the vascular anatomy. CONTRAST:  164mL ISOVUE-370 IOPAMIDOL (ISOVUE-370) INJECTION 76% COMPARISON:  11/25/2016 chest CT.  01/15/2017 chest radiograph. FINDINGS: Cardiovascular: The study is high quality for the evaluation of pulmonary embolism. There are no filling defects in the central, lobar, segmental or subsegmental pulmonary artery branches to suggest acute pulmonary embolism. Atherosclerotic nonaneurysmal thoracic aorta. Normal caliber pulmonary arteries (main pulmonary artery diameter 2.9 cm). Stable mild cardiomegaly. No significant pericardial fluid/thickening. Three-vessel coronary atherosclerosis. Mediastinum/Nodes: Total thyroidectomy. Unremarkable esophagus. No pathologically enlarged axillary, mediastinal or hilar lymph nodes. Lungs/Pleura: No pneumothorax. No pleural effusion. Right middle lobe 3 mm solid pulmonary nodule (series 7/ image 44), stable since 11/25/2016 chest CT. Anterior left lower lobe 1.2 cm solid  pulmonary nodule (series 7/ image 57), stable since 11/25/2016 chest CT. Mild scarring versus atelectasis at the left lung base. No acute consolidative airspace disease, lung masses or new significant pulmonary nodules. Upper abdomen: No acute abnormality. Partially visualized main pancreatic duct stent in the remnant pancreatic body. Musculoskeletal: No aggressive appearing focal osseous lesions. Heterogeneous sclerotic appearance of the thoracic vertebral bodies, not appreciably changed, with marked thoracic spondylosis. Review of the MIP images confirms the above findings. IMPRESSION: 1. No pulmonary embolism.  No acute pulmonary disease. 2. No definite evidence of metastatic disease in the chest. Two pulmonary nodules are stable since 11/25/2016, suggesting benign nodules. Recommend continued chest CT surveillance in 6 months. 3. Stable mild cardiomegaly.  Three-vessel coronary atherosclerosis. Aortic Atherosclerosis (ICD10-I70.0). These results will be called to the ordering clinician  or representative by the Radiology Department at the imaging location. Electronically Signed   By: Ilona Sorrel M.D.   On: 07/30/2017 16:47   Ct Angio Abd/pel W/ And/or W/o  Result Date: 08/03/2017 CLINICAL DATA:  Filter, DVT, assess for clot extension centrally EXAM: CT ANGIOGRAPHY ABDOMEN AND PELVIS TECHNIQUE: Multidetector CT imaging of the abdomen and pelvis was performed using the standard protocol during bolus administration of intravenous contrast. Multiplanar reconstructed images including MIPs were obtained and reviewed to evaluate the vascular anatomy. CONTRAST:  100 cc Isovue 370 COMPARISON:  03/24/2017 FINDINGS: Arterial findings: Aorta: Aortic atherosclerosis without aneurysm, dissection, retroperitoneal hemorrhage, or significant occlusive process. Celiac axis: Heavily calcified origin with ostial narrowing, difficult to truly assess but estimated greater than 50%. Celiac axis remains patent. Superior  mesenteric: Calcified origin but remains patent. Minor narrowing. Left renal: Heavily calcified with ostial narrowing, difficult to truly assess degree of stenosis. Right renal: Minor calcified origin. No significant narrowing. Inferior mesenteric:  Remains patent off of the distal aorta. Left iliac: Left common, internal external iliac arteries are calcified but remain patent. No inflow disease. Right iliac: Right common, internal and external iliac arteries are patent. Mild atherosclerosis and tortuosity. No inflow disease. Venous findings: Hepatic, portal, mesenteric, splenic, and renal veins are all patent. Suprarenal IVC and hepatic IVC are patent. Infrarenal IVC filter noted terminates at the bifurcation. Below the filter the study is limited but there does appear to be hypodense thrombus in the IVC below the filter extending into the iliac veins to some degree. This has a more subacute to chronic appearance without significant vein distension or perivenous strandy edema. Review of the MIP images confirms the above findings. Nonvascular findings: Lower chest: Minor basilar atelectasis. Right middle lobe noncalcified 3 mm nodule noted, image 18 series 7. Atherosclerosis of the thoracic aorta and native coronary vasculature. Mild cardiac enlargement no pericardial effusion. Trace right pleural effusion. Hepatic biliary: Marked hypoattenuation of the liver compatible with hepatic steatosis. Cholecystectomy noted. No biliary dilatation or obstruction. Pancreas: Postop changes from previous Whipple procedure. Atrophy of the pancreas body and tail. Pancreatic stent noted in the head and body region. No focal abnormality or surrounding inflammatory process. No surrounding fluid collection. Stable 12 mm cystic area at the pancreatic tail. Spleen:  Normal in size.  No focal abnormality. Adrenals and urinary tract: Normal adrenal glands. No focal renal abnormality, obstruction, hydronephrosis or perinephric inflammatory  process. Ureters are symmetric and decompressed. No obstructing ureteral calculus. Urinary bladder under distended. Stomach and bowel: Previous gastrostomy removed. Postop changes of the stomach with a gastric jejunal bypass. Negative for bowel obstruction. No significant dilatation, ileus, or free air. Moderate colonic stool burden. Normal appendix demonstrated. No ascites, fluid collection, or abscess. Lymphatics:  No adenopathy. Reproductive: Remote hysterectomy. No adnexal mass or pelvic free fluid. Other: Resolving left lower abdominal rectus sheath hematoma. Residual chronic hematoma measures 4.5 x 3.0 cm of the left rectus sheath image 79 series 3. Musculoskeletal: Degenerative changes noted spine. Previous fusion L4-5. Facet arthropathy noted at multiple levels. 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 IVC above the filter is well opacified and patent. Atherosclerosis as above Hepatic steatosis Bibasilar atelectasis Postop changes of the abdomen as above without bowel obstruction para Resolving left inferior rectus sheath hematoma 3 mm right middle lobe pulmonary nodule No follow-up needed if patient is low-risk. Non-contrast chest CT can be considered in 12 months if patient is high-risk. This  recommendation follows the consensus statement: Guidelines for Management of Incidental Pulmonary Nodules Detected on CT Images: From the Fleischner Society 2017; Radiology 2017; 284:228-243. Electronically Signed   By: Jerilynn Mages.  Shick M.D.   On: 08/03/2017 15:43    Discharge Exam:  Vitals:   08/06/17 0024 08/06/17 0435 08/06/17 0809   BP: (!) 153/53 (!) 146/61 (!) 157/66   Pulse: (!) 51 (!) 52 (!) 51   Resp: 17 20 18    Temp: 97.7 F (36.5 C) 97.7 F (36.5 C) 98.6 F (37 C)   TempSrc: Oral Oral Tympanic   SpO2: 100% 96% 98%   Weight:  91.1 kg (200 lb 14.4 oz)    Height:        General: Pt is alert, awake, not in acute  distress Cardiovascular: RRR, S1/S2 +, no rubs, no gallops Respiratory: CTA bilaterally, no wheezing, no rhonchi Abdominal: Soft, NT, ND, bowel sounds + Extremities: b/l LE edema - improved    The results of significant diagnostics from this hospitalization (including imaging, microbiology, ancillary and laboratory) are listed below for reference.     Microbiology: No results found for this or any previous visit (from the past 240 hour(s)).   Labs: BNP (last 3 results) No results for input(s): BNP in the last 8760 hours. Basic Metabolic Panel: Recent Labs  Lab 07/31/17 2359 08/02/17 0256 08/03/17 0438 08/04/17 0239 08/05/17 0538  NA 136 137 139 137 138  K 3.3* 3.3* 3.5 3.2* 3.4*  CL 101 103 105 105 106  CO2 27 25 26 23 25   GLUCOSE 271* 240* 175* 208* 187*  BUN 15 11 14 12 9   CREATININE 1.28* 1.22* 1.21* 1.14* 0.98  CALCIUM 8.0* 8.2* 8.6* 8.4* 8.6*   Liver Function Tests: No results for input(s): AST, ALT, ALKPHOS, BILITOT, PROT, ALBUMIN in the last 168 hours. No results for input(s): LIPASE, AMYLASE in the last 168 hours. No results for input(s): AMMONIA in the last 168 hours. CBC: Recent Labs  Lab 07/31/17 0251 07/31/17 2359 08/02/17 0256 08/03/17 0438 08/04/17 0239  WBC 5.7 6.3 6.2 6.7 6.4  HGB 11.2* 10.8* 10.8* 11.1* 10.8*  HCT 33.9* 33.3* 33.7* 34.7* 33.7*  MCV 95.5 95.7 95.7 95.6 95.2  PLT 251 236 243 248 249   Cardiac Enzymes: No results for input(s): CKTOTAL, CKMB, CKMBINDEX, TROPONINI in the last 168 hours. BNP: Invalid input(s): POCBNP CBG: Recent Labs  Lab 08/05/17 1139 08/05/17 1616 08/05/17 2137 08/06/17 0629 08/06/17 1129  GLUCAP 178* 145* 204* 142* 185*   D-Dimer No results for input(s): DDIMER in the last 72 hours. Hgb A1c No results for input(s): HGBA1C in the last 72 hours. Lipid Profile No results for input(s): CHOL, HDL, LDLCALC, TRIG, CHOLHDL, LDLDIRECT in the last 72 hours. Thyroid function studies No results for input(s):  TSH, T4TOTAL, T3FREE, THYROIDAB in the last 72 hours.  Invalid input(s): FREET3 Anemia work up No results for input(s): VITAMINB12, FOLATE, FERRITIN, TIBC, IRON, RETICCTPCT in the last 72 hours. Urinalysis    Component Value Date/Time   COLORURINE YELLOW 02/05/2017 2352   APPEARANCEUR HAZY (A) 02/05/2017 2352   LABSPEC 1.012 02/05/2017 2352   PHURINE 5.0 02/05/2017 2352   GLUCOSEU NEGATIVE 02/05/2017 2352   HGBUR NEGATIVE 02/05/2017 2352   BILIRUBINUR NEGATIVE 02/05/2017 2352   KETONESUR NEGATIVE 02/05/2017 2352   PROTEINUR NEGATIVE 02/05/2017 2352   UROBILINOGEN 0.2 09/16/2014 1317   NITRITE NEGATIVE 02/05/2017 2352   LEUKOCYTESUR NEGATIVE 02/05/2017 2352   Sepsis Labs Invalid input(s): PROCALCITONIN,  WBC,  LACTICIDVEN  Time coordinating discharge: 34 minutes  SIGNED:  Chipper Oman, MD  [facility] 08/06/2017, 3:18 PM  Pager please text page via  [email or web address] Password [email or web address]

## 2017-08-06 NOTE — Progress Notes (Signed)
Discharged to home with family office visits in place teaching done  

## 2017-08-06 NOTE — Progress Notes (Signed)
[place] for heparin and Coumadin Indication: DVT  Allergies  Allergen Reactions  . Hydralazine Hcl Shortness Of Breath    Pt had severe respiratory distress after receiving a dose  . Darvon [Propoxyphene Hcl] Other (See Comments)    Hallucinations   . Nyquil Multi-Symptom [Pseudoeph-Doxylamine-Dm-Apap] Other (See Comments)    Makes pt not "feel right in her head"  . Amlodipine Besylate Swelling  . Nifedipine Er Swelling  . Metformin And Related Diarrhea    Patient Measurements: Height: 5\' 4"  (162.6 cm) Weight: 200 lb 14.4 oz (91.1 kg) IBW/kg (Calculated) : 54.7 Heparin Dosing Weight: 76 kg  Assessment: 81 yo F presents on 12/14 with DVT (history of DVT earlier this year but anticoagulation was stopped due to rectus sheath hematoma; IVC placed). Not on any anticoagulation PTA. Pharmacy consulted to dose heparin and coumadin (completed 5 days of overlap). Patient is also noted with history of cancer. CBC stable. No bleed documented.  Heparin level remains therapeutic. INR now therapeutic, trending up to 2.02. Given her history, have backed down slightly on doses.  Goal of Therapy:  Heparin level 0.3-0.7 units/ml  INR 2-3 Monitor platelets by anticoagulation protocol: Yes   Plan:  Continue heparin at 900 units/hr - d/c when INR therapeutic x 24hr Warfarin 7.5mg  x1 Monitor daily heparin level / INR / CBC, s/sx bleeding   [name], PharmD, BCPS Clinical Pharmacist Clinical phone for 08/06/2017 until 3:30pm: [phone] If after 3:30pm, please call main pharmacy at: [phone] 08/06/2017 11:03 AM

## 2017-08-12 ENCOUNTER — Ambulatory Visit (INDEPENDENT_AMBULATORY_CARE_PROVIDER_SITE_OTHER): Payer: Medicare Other | Admitting: Pharmacist

## 2017-08-12 ENCOUNTER — Encounter: Payer: Self-pay | Admitting: Pharmacist

## 2017-08-12 VITALS — BP 160/74 | HR 62

## 2017-08-12 DIAGNOSIS — Z7901 Long term (current) use of anticoagulants: Secondary | ICD-10-CM

## 2017-08-12 DIAGNOSIS — I82442 Acute embolism and thrombosis of left tibial vein: Secondary | ICD-10-CM | POA: Diagnosis not present

## 2017-08-12 DIAGNOSIS — I10 Essential (primary) hypertension: Secondary | ICD-10-CM | POA: Diagnosis not present

## 2017-08-12 LAB — POCT INR: INR: 3.1

## 2017-08-12 MED ORDER — POTASSIUM CHLORIDE CRYS ER 20 MEQ PO TBCR: 20.0000 meq | EXTENDED_RELEASE_TABLET | Freq: Every day | ORAL | 0 refills | Status: DC

## 2017-08-12 NOTE — Assessment & Plan Note (Signed)
Blood pressure remains above desired goal today. Patient reports 3 days without Irbesartan 300 mg and > 24 hours since last furosemide dose; therefore BP reading is not accurate today and required additional assessment prior to additional medication adjustment. She denies problems with therapy but reports "running out" of irbesartan few days ago and plan to pick up refill today. Noted patient discharge home on chronic furosemide but no potassium with recent serum K at 3.4. Mrs Filippone requests refill for potassium today as well.  Will resume Irbesartan 300mg  daily, resume potassium 59mEq daily, and continue all other medication as previously prescribed. Nurse from [facility] (Amy) will follow up INR in 5 day and monitor BP to report readings to pharmacist clinic. Patient is not driving right now and is unable to come to clinic for 2 weeks follow up as recommended. Plan to schedule next HTN clinic visit as soon as patient able to come to clinic.

## 2017-08-12 NOTE — Progress Notes (Signed)
Patient ID: Kaylee Mullins                 DOB: 03/30/1936                      MRN: [id number]     HPI: Kaylee Mullins is a 81 y.o. female referred by Dr. Radford Pax to HTN clinic. PMH includes CVA, DVT, CKD - stage 3, adenocarcinoma, DM-II, dyslipidemia, and SVT.  Metoprolol LX 100mg  daily was discontinued on 07/28/2017 and replaced by carvedilol 25mg  twice daily to improve BP control.  Patient was recently admitted to [facility] for lower extremity DVT and discharged on 08/06/2017. She presents to clinic today for hypertension follow up and anticoagulation management. Denies dizziness, chest pain, increased swelling, headaches or any other problems with therapy. Patient  reports running out of irbesartan 2-3 days ago and taking last furosemide > 24 hours ago.   Current HTN meds:  Carvedilol 25mg  twice daily Furosemide 40mg  daily - no dose today yet Irbesartan 300mg  daily - last dose 3 days ago Nifedipine XL 30mg  daily  Previously tried:  Hydralazine - severe respiratory distress amlodipine - edema per MDs noted  BP goal: 130/80 (<140/90 due to age)  Family History: Heart attack in her father; Heart failure in her mother  Social History: denies tobacco use and alcohol use  Diet: never adds salt to food, aware of low sodium recommendations,mainly home cooked meals,  limits fast food intake   Exercise: limited at this time due to low extremities DVT  Home BP readings: 3 readings provided; no home BP device to calibrate today Dec/22: 196/80 Dec/23: 159/76 Dec/24: 149/75  Wt Readings from Last 3 Encounters:  08/06/17 200 lb 14.4 oz (91.1 kg)  07/30/17 206 lb 9.6 oz (93.7 kg)  06/23/17 207 lb 3.2 oz (94 kg)   BP Readings from Last 3 Encounters:  08/12/17 (!) 160/74  08/06/17 (!) 202/79  07/30/17 (!) 164/92   Pulse Readings from Last 3 Encounters:  08/12/17 62  08/06/17 (!) 54  07/30/17 65    Renal function: Estimated Creatinine Clearance: 50.9 mL/min (by C-G formula based on  SCr of 0.98 mg/dL).  Past Medical History:  Diagnosis Date  . Acute CVA (cerebrovascular accident) ([place]) 09/16/2014  . Acute DVT of left tibial vein ([place]) 04/03/2017  . Adenocarcinoma ([place]) 01/12/2017  . 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 Anxiety   . Arthritis    knees  . Black tarry stools    05-14-16 negative for occult blood with ER visit- noted in [place].  . Cancer [facility])    thyroid cancer- surgery and radiation  . Cerebral infarction due to unspecified mechanism   . Chronic kidney disease    questionable mass on kidney. Being followed by Dr Diona Fanti  . Complication of anesthesia    heart rate was really low  . CVA (cerebral infarction) 09/11/2014  . Depression   . Diabetes mellitus    type 2  . Diabetes mellitus without complication ([place]) 02/20/5464   Qualifier: Diagnosis of  By: Loanne Drilling MD, Jacelyn Pi   . Dyslipidemia 04/20/2007   Qualifier: Diagnosis of  By: Loanne Drilling MD, Jacelyn Pi   . Full dentures   . Gastroparesis 02/06/2017  . GERD (gastroesophageal reflux disease) 04/03/2017  . History of CVA (cerebral vascular accident) ([place]) 09/11/2014  . Hypertension   . Hypothyroidism   . Lumbar stenosis with neurogenic claudication 02/10/2016  . Osteoarthritis 09/11/2014  . Pneumonia   . Right renal  mass 09/13/2014  . Spinal stenosis   . Stroke ([place]) 09/2014   left sided weakness  . SVT (supraventricular tachycardia) ([place]) 07/30/2017    Current Outpatient Medications on File Prior to Visit  Medication Sig Dispense Refill  . acetaminophen (TYLENOL) 500 MG tablet Take 500 mg by mouth every 6 (six) hours as needed for mild pain.    . B-D UF III MINI PEN NEEDLES 31G X 5 MM MISC USE AS DIRECTED WITH HUMALOG  0  . carvedilol (COREG) 25 MG tablet Take 1 tablet (25 mg total) by mouth 2 (two) times daily. 180 tablet 3  . furosemide (LASIX) 40 MG tablet Take 40 mg by mouth daily.     Marland Kitchen HUMALOG KWIKPEN 100 UNIT/ML KiwkPen USE SLIDING SCALE EVERY 4 HOURS BASED ON BLOOD SUGAR READINGS, SLIDING SCALE  INCLUDED  0  . insulin detemir (LEVEMIR) 100 UNIT/ML injection Inject 0.07 mLs (7 Units total) into the skin 2 (two) times daily. 10 mL 0  . irbesartan (AVAPRO) 300 MG tablet Take 1 tablet (300 mg total) by mouth daily. 30 tablet 0  . levothyroxine (SYNTHROID, LEVOTHROID) 200 MCG tablet Take 200 mcg by mouth daily before breakfast. For hypothyroidism  99  . NIFEdipine (PROCARDIA-XL/ADALAT-CC/NIFEDICAL-XL) 30 MG 24 hr tablet Take 30 mg by mouth daily.  2  . pantoprazole (PROTONIX) 40 MG tablet Take 1 tablet (40 mg total) by mouth at bedtime. (Patient taking differently: Take 40 mg by mouth 2 (two) times daily. ) 30 tablet 0  . warfarin (COUMADIN) 7.5 MG tablet Take 1 tablet (7.5 mg total) by mouth one time only at 6 PM. 30 tablet 0   No current facility-administered medications on file prior to visit.     Allergies  Allergen Reactions  . Hydralazine Hcl Shortness Of Breath    Pt had severe respiratory distress after receiving a dose  . Darvon [Propoxyphene Hcl] Other (See Comments)    Hallucinations   . Nyquil Multi-Symptom [Pseudoeph-Doxylamine-Dm-Apap] Other (See Comments)    Makes pt not "feel right in her head"  . Amlodipine Besylate Swelling  . Nifedipine Er Swelling  . Metformin And Related Diarrhea    Blood pressure (!) 160/74, pulse 62.  Essential hypertension Blood pressure remains above desired goal today. Patient reports 3 days without Irbesartan 300 mg and > 24 hours since last furosemide dose; therefore BP reading is not accurate today and required additional assessment prior to additional medication adjustment. She denies problems with therapy but reports "running out" of irbesartan few days ago and plan to pick up refill today. Noted patient discharge home on chronic furosemide but no potassium with recent serum K at 3.4. Mrs Otwell requests refill for potassium today as well.  Will resume Irbesartan 300mg  daily, resume potassium 47mEq daily, and continue all other  medication as previously prescribed. Nurse from [place] (Amy) will follow up INR in 5 day and monitor BP to report readings to pharmacist clinic. Patient is not driving right now and is unable to come to clinic for 2 weeks follow up as recommended. Plan to schedule next HTN clinic visit as soon as patient able to come to clinic.  Starkisha Tullis Rodriguez-Guzman PharmD, BCPS, [place] [id number] 08/12/2017 8:45 PM

## 2017-08-12 NOTE — Patient Instructions (Addendum)
Return for a follow up appointment as needed  Check your blood pressure at home daily (if able) and keep record of the readings.  Take your BP meds as follows: *All medication as previously prescribed*  Bring all of your meds, your BP cuff and your record of home blood pressures to your next appointment.  Exercise as you're able, try to walk approximately 30 minutes per day.  Keep salt intake to a minimum, especially watch canned and prepared boxed foods.  Eat more fresh fruits and vegetables and fewer canned items.  Avoid eating in fast food restaurants.    HOW TO TAKE YOUR BLOOD PRESSURE: . Rest 5 minutes before taking your blood pressure. .  Don't smoke or drink caffeinated beverages for at least 30 minutes before. . Take your blood pressure before (not after) you eat. . Sit comfortably with your back supported and both feet on the floor (don't cross your legs). . Elevate your arm to heart level on a table or a desk. . Use the proper sized cuff. It should fit smoothly and snugly around your bare upper arm. There should be enough room to slip a fingertip under the cuff. The bottom edge of the cuff should be 1 inch above the crease of the elbow. . Ideally, take 3 measurements at one sitting and record the average.

## 2017-08-16 ENCOUNTER — Ambulatory Visit (INDEPENDENT_AMBULATORY_CARE_PROVIDER_SITE_OTHER): Payer: Medicare Other | Admitting: Pharmacist

## 2017-08-16 DIAGNOSIS — Z7901 Long term (current) use of anticoagulants: Secondary | ICD-10-CM | POA: Diagnosis not present

## 2017-08-16 DIAGNOSIS — I82442 Acute embolism and thrombosis of left tibial vein: Secondary | ICD-10-CM

## 2017-08-16 LAB — POCT INR: INR: 3.1

## 2017-08-18 ENCOUNTER — Other Ambulatory Visit (HOSPITAL_COMMUNITY): Payer: Self-pay | Admitting: Interventional Radiology

## 2017-08-18 DIAGNOSIS — Z95828 Presence of other vascular implants and grafts: Secondary | ICD-10-CM

## 2017-08-19 ENCOUNTER — Ambulatory Visit: Payer: Medicare Other | Admitting: Physician Assistant

## 2017-08-23 ENCOUNTER — Encounter: Payer: Self-pay | Admitting: Physician Assistant

## 2017-08-23 ENCOUNTER — Ambulatory Visit (INDEPENDENT_AMBULATORY_CARE_PROVIDER_SITE_OTHER): Payer: Medicare Other | Admitting: Physician Assistant

## 2017-08-23 ENCOUNTER — Ambulatory Visit (INDEPENDENT_AMBULATORY_CARE_PROVIDER_SITE_OTHER): Payer: Medicare Other | Admitting: Pharmacist

## 2017-08-23 VITALS — BP 120/52 | HR 58 | Ht 64.0 in | Wt 211.8 lb

## 2017-08-23 DIAGNOSIS — N183 Chronic kidney disease, stage 3 unspecified: Secondary | ICD-10-CM

## 2017-08-23 DIAGNOSIS — Z7901 Long term (current) use of anticoagulants: Secondary | ICD-10-CM | POA: Diagnosis not present

## 2017-08-23 DIAGNOSIS — I471 Supraventricular tachycardia: Secondary | ICD-10-CM

## 2017-08-23 DIAGNOSIS — I10 Essential (primary) hypertension: Secondary | ICD-10-CM | POA: Diagnosis not present

## 2017-08-23 DIAGNOSIS — I825Y3 Chronic embolism and thrombosis of unspecified deep veins of proximal lower extremity, bilateral: Secondary | ICD-10-CM | POA: Diagnosis not present

## 2017-08-23 DIAGNOSIS — I82442 Acute embolism and thrombosis of left tibial vein: Secondary | ICD-10-CM

## 2017-08-23 LAB — POCT INR: INR: 2.3

## 2017-08-23 NOTE — Progress Notes (Signed)
Cardiology Office Note:    Date:  08/23/2017   ID:  [name], DOB [birth year], MRN [id number]  PCP:  Nolene Ebbs, MD  Cardiologist:  Dorris Carnes, MD   Referring MD: Nolene Ebbs, MD   Chief Complaint  Patient presents with  . Hospitalization Follow-up    Admitted with bilateral DVT    History of Present Illness:    Kaylee Mullins is a 82 y.o. 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  Her course was complicated by SVT, DVT, rectus sheath hematoma requiring cessation of anticoagulation and IVC filter placement.  She was admitted from the office (12/14-12/21) with recurrent, extensive bilateral lower extremity DVTs.  CT was negative for pulmonary embolism.   She is high risk for anticoagulation given previous history of bleeding.  However, given burden of DVT it was decided to pursue anticoagulation.  She was placed on heparin and Coumadin.  Her care was assumed by the internal medicine service.  Notes indicate she was seen by interventional radiology CT of the abdomen and pelvis with venogram demonstrated chronic lower IVC and by iliac thrombus below her IVC filter.  IVC above the filter was patent.  Coumadin was continued and is currently being followed in our clinic.    Kaylee Mullins returns for follow-up.  She denies chest discomfort, significant dyspnea, syncope, palpitation, PND.  She has chronic lower extremity edema.  She feels that the compression stockings are helping this somewhat.  She denies any bleeding issues.  Prior CV studies:   The following studies were reviewed today:  Echo 06/28/17 Mild concentric LVH, EF 60-65, normal wall motion, grade 1 diastolic dysfunction, mild AI, mild LAE, normal RV SF  Nuclear stress test 6/17 Inferior/inferolateral infarct with mild peri-infarct ischemia versus diaphragmatic attenuation, no  significant ischemia, EF 56, low risk  Echo 6/17 Mild concentric LVH, EF 55-60, normal wall motion, grade 2 diastolic dysfunction, trivial AI  Carotid US 1/16 Bilateral ICA 1-39  Past Medical History:  Diagnosis Date  . Acute CVA (cerebrovascular accident) ([place]) 09/16/2014  . Acute DVT of left tibial vein ([place]) 04/03/2017  . Adenocarcinoma ([place]) 01/12/2017  . 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 Anxiety   . Arthritis    knees  . Black tarry stools    05-14-16 negative for occult blood with ER visit- noted in [place].  . Cancer [facility])    thyroid cancer- surgery and radiation  . Cerebral infarction due to unspecified mechanism   . Chronic kidney disease    questionable mass on kidney. Being followed by Dr Diona Fanti  . Complication of anesthesia    heart rate was really low  . CVA (cerebral infarction) 09/11/2014  . Depression   . Diabetes mellitus    type 2  . Diabetes mellitus without complication ([place]) 0/08/7508   Qualifier: Diagnosis of  By: Loanne Drilling MD, Jacelyn Pi   . Dyslipidemia 04/20/2007   Qualifier: Diagnosis of  By: Loanne Drilling MD, Jacelyn Pi   . Full dentures   . Gastroparesis 02/06/2017  . GERD (gastroesophageal reflux disease) 04/03/2017  . History of CVA (cerebral vascular accident) ([place]) 09/11/2014  . Hypertension   . Hypothyroidism   . Lumbar stenosis with neurogenic claudication 02/10/2016  . Osteoarthritis 09/11/2014  . Pneumonia   . Right renal mass 09/13/2014  . Spinal stenosis   . Stroke ([place]) 09/2014   left sided weakness  .  SVT (supraventricular tachycardia) ([place]) 07/30/2017    Past Surgical History:  Procedure Laterality Date  . ABDOMINAL HYSTERECTOMY     partial  . cataracts     Removed  11/2015  bilateral  . COLONOSCOPY W/ POLYPECTOMY    . ERCP N/A 05/07/2016   Procedure: ENDOSCOPIC RETROGRADE CHOLANGIOPANCREATOGRAPHY (ERCP);  Surgeon: Clarene Essex, MD;  Location: [facility] ENDOSCOPY;  Service: Endoscopy;  Laterality: N/A;  . ERCP N/A 10/16/2016   Procedure: ENDOSCOPIC  RETROGRADE CHOLANGIOPANCREATOGRAPHY (ERCP);  Surgeon: Clarene Essex, MD;  [facility] END[facility];  Service: Endoscopy;  Laterality: N/A;  . ESOPHAGOGASTRODUODENOSCOPY N/A 02/10/2017   Procedure: ESOPHAGOGASTRODUODENOSCOPY (EGD);  Surgeon: Teena Irani, MD;  Location: [facility] ENDOSCOPY;  Service: Endoscopy;  Laterality: N/A;  . EUS N/A 05/27/2016   Procedure: ESOPHAGEAL ENDOSCOPIC ULTRASOUND (EUS) RADIAL;  Surgeon: Arta Silence, MD;  Location: [facility] ENDOSCOPY;  Service: Endoscopy;  Laterality: N/A;  . FLEXIBLE SIGMOIDOSCOPY  03/29/2012   Procedure: FLEXIBLE SIGMOIDOSCOPY;  Surgeon: Jeryl Columbia, MD;  Location: [facility] ENDOSCOPY;  Service: Endoscopy;  Laterality: N/A;  fleet enema upon arrival  . HOT HEMOSTASIS  03/29/2012   Procedure: HOT HEMOSTASIS (ARGON PLASMA COAGULATION/BICAP);  Surgeon: Jeryl Columbia, MD;  Location[facility] ENDOSCOPY;  Service: Endoscopy;  Laterality: N/A;  . IR GASTR TUBE CONVERT GASTR-JEJ PER W/FL MOD SED  02/23/2017  . IR GASTROSTOMY TUBE MOD SED  02/16/2017  . IR GENERIC HISTORICAL  06/30/2016   IR RADIOLOGIST EVAL & MGMT 06/30/2016 Aletta Edouard, MD [facility] INTERV RAD  . IR GENERIC HISTORICAL  09/09/2016   IR RADIOLOGIST EVAL & MGMT 09/09/2016 Aletta Edouard, MD [facility] INTERV RAD  . IR IVC FILTER PLMT / S&I /IMG GUID/MOD SED  03/03/2017  . IR PATIENT EVAL TECH 0-60 MINS  05/12/2017  . LAPAROSCOPY N/A 01/12/2017   Procedure: LAPAROSCOPY DIAGNOSTIC;  Surgeon: Stark Klein, MD;  Location: [facility];  Service: General;  Laterality: N/A;  . LAPAROTOMY N/A 03/10/2017   Procedure: EXPLORATORY LAPAROTOMY Open jejunostomy tube;  Surgeon: Stark Klein, MD;  Location: [facility];  Service: General;  Laterality: N/A;  . LUMBAR LAMINECTOMY/DECOMPRESSION MICRODISCECTOMY N/A 02/10/2016   Procedure: Lumbar three- four Laminectomy;  Surgeon: Kristeen Miss, MD;  Location: [facility] NEURO ORS;  Service: Neurosurgery;  Laterality: N/A;  L3-4 Laminectomy  . LUMBAR SPINE SURGERY     1st surgery "ray cage placed"  . THYROIDECTOMY  2005   . TONSILLECTOMY    . WHIPPLE PROCEDURE N/A 01/12/2017   Procedure: WHIPPLE PROCEDURE;  Surgeon: Stark Klein, MD;  Location: [facility];  Service: General;  Laterality: N/A;    Current Medications: Current Meds  Medication Sig  . acetaminophen (TYLENOL) 500 MG tablet Take 500 mg by mouth every 6 (six) hours as needed for mild pain.  . B-D UF III MINI PEN NEEDLES 31G X 5 MM MISC USE AS DIRECTED WITH HUMALOG  . carvedilol (COREG) 25 MG tablet Take 1 tablet (25 mg total) by mouth 2 (two) times daily.  . furosemide (LASIX) 40 MG tablet Take 40 mg by mouth daily.   Marland Kitchen HUMALOG KWIKPEN 100 UNIT/ML KiwkPen USE SLIDING SCALE EVERY 4 HOURS BASED ON BLOOD SUGAR READINGS, SLIDING SCALE INCLUDED  . insulin detemir (LEVEMIR) 100 UNIT/ML injection Inject 0.07 mLs (7 Units total) into the skin 2 (two) times daily.  . irbesartan (AVAPRO) 300 MG tablet Take 1 tablet (300 mg total) by mouth daily.  Marland Kitchen levothyroxine (SYNTHROID, LEVOTHROID) 200 MCG tablet Take 200 mcg by mouth daily before breakfast. For hypothyroidism  . NIFEdipine (PROCARDIA-XL/ADALAT-CC/NIFEDICAL-XL) 30  MG 24 hr tablet Take 30 mg by mouth daily.  . pantoprazole (PROTONIX) 40 MG tablet Take 1 tablet (40 mg total) by mouth at bedtime.  . potassium chloride SA (K-DUR,KLOR-CON) 20 MEQ tablet Take 1 tablet (20 mEq total) by mouth daily.  Marland Kitchen warfarin (COUMADIN) 7.5 MG tablet Take 1 tablet (7.5 mg total) by mouth one time only at 6 PM.     Allergies:   Hydralazine hcl; Darvon [propoxyphene hcl]; Nyquil multi-symptom [pseudoeph-doxylamine-dm-apap]; Amlodipine besylate; Nifedipine er; and Metformin and related   Social History   Tobacco Use  . Smoking status: Never Smoker  . Smokeless tobacco: Never Used  Substance Use Topics  . Alcohol use: No  . Drug use: No     Family Hx: The patient's family history includes Heart attack in her father; Heart failure in her mother.  ROS:   Please see the history of present illness.    Review of Systems    HENT: Positive for hearing loss.   Cardiovascular: Positive for dyspnea on exertion, irregular heartbeat and leg swelling.  Musculoskeletal: Positive for back pain, joint pain and joint swelling.  Gastrointestinal: Positive for abdominal pain.  Neurological: Positive for loss of balance.  Psychiatric/Behavioral: Positive for depression.   All other systems reviewed and are negative.   EKGs/Labs/Other Test Reviewed:    EKG:  EKG is  ordered today.  The ekg ordered today demonstrates sinus bradycardia, HR 57, left axis deviation, T wave inversions 1, aVL, QTC 467-when compared to historical tracings, similar T wave changes have been noted  Recent Labs: 03/29/2017: Magnesium 1.9 04/27/2017: ALT 16 08/03/2017: TSH 24.002 08/04/2017: Hemoglobin 10.8; Platelets 249 08/05/2017: BUN 9; Creatinine, Ser 0.98; Potassium 3.4; Sodium 138   Recent Lipid Panel Lab Results  Component Value Date/Time   CHOL 120 04/09/2017   TRIG 137 04/09/2017   HDL 35 04/09/2017   CHOLHDL 3.0 09/13/2014 09:10 AM   LDLCALC 58 04/09/2017    Physical Exam:    VS:  BP (!) 120/52   Pulse (!) 58   Ht 5\' 4"  (1.626 m)   Wt 211 lb 12.8 oz (96.1 kg)   BMI 36.36 kg/m     Wt Readings from Last 3 Encounters:  08/23/17 211 lb 12.8 oz (96.1 kg)  08/06/17 200 lb 14.4 oz (91.1 kg)  07/30/17 206 lb 9.6 oz (93.7 kg)     Physical Exam  Constitutional: She is oriented to person, place, and time. She appears well-developed and well-nourished.  HENT:  Head: Normocephalic and atraumatic.  Neck: No JVD present.  Cardiovascular: Normal rate and regular rhythm.  No murmur heard. Pulmonary/Chest: Effort normal. She has no rales.  Abdominal: Soft.  Musculoskeletal: She exhibits edema (1-2+ bilat edema).  Neurological: She is alert and oriented to person, place, and time.  Skin: Skin is warm and dry.    ASSESSMENT:    1. SVT (supraventricular tachycardia) ([place])   2. Chronic deep vein thrombosis (DVT) of proximal vein  of both lower extremities (HCC)   3. Hypertension, essential   4. Stage 3 chronic kidney disease ([place])    PLAN:    In order of problems listed above:  1. SVT (supraventricular tachycardia) (HCC) No apparent recurrence.  Continue beta-blocker therapy.  2. Chronic deep vein thrombosis (DVT) of proximal vein of both lower extremities (HCC) Status post IVC filter.  She remains on Coumadin therapy which is followed in our Coumadin clinic.  I have encouraged her to continue to use compression stockings to help control  her lower extremity edema.  She also takes furosemide.  Obtain follow-up basic metabolic panel today.  3. Hypertension, essential  The patient's blood pressure is controlled on her current regimen.  Continue current therapy.   4. Stage 3 chronic kidney disease ([place]) Obtain follow-up basic metabolic panel today.   Dispo:  Return in about 3 months (around 11/21/2017) for Routine Follow Up, w/ Dr. Harrington Challenger.   Medication Adjustments/Labs and Tests Ordered: Current medicines are reviewed at length with the patient today.  Concerns regarding medicines are outlined above.  Tests Ordered: Orders Placed This Encounter  Procedures  . Basic Metabolic Panel (BMET)  . EKG 12-Lead   Medication Changes: No orders of the defined types were placed in this encounter.   Signed, Richardson Dopp, PA-C  08/23/2017 5:31 PM    [facility] [address], [place], [place]  [address] Phone: [phone]; Fax: [phone]

## 2017-08-23 NOTE — Patient Instructions (Signed)
Medication Instructions:  1. Your physician recommends that you continue on your current medications as directed. Please refer to the Current Medication list given to you today.   Labwork: TODAY BMET  Testing/Procedures: NONE ORDERED TODAY  Follow-Up: October 18, 2017 @ 52 NOON WITH DR. ROSS  Any Other Special Instructions Will Be Listed Below (If Applicable).     If you need a refill on your cardiac medications before your next appointment, please call your pharmacy.

## 2017-08-24 LAB — BASIC METABOLIC PANEL
BUN/Creatinine Ratio: 15 (ref 12–28)
BUN: 16 mg/dL (ref 8–27)
CO2: 19 mmol/L — ABNORMAL LOW (ref 20–29)
Calcium: 8.7 mg/dL (ref 8.7–10.3)
Chloride: 105 mmol/L (ref 96–106)
Creatinine, Ser: 1.08 mg/dL — ABNORMAL HIGH (ref 0.57–1.00)
GFR calc Af Amer: 56 mL/min/{1.73_m2} — ABNORMAL LOW (ref >59)
GFR calc non Af Amer: 49 mL/min/{1.73_m2} — ABNORMAL LOW (ref >59)
Glucose: 362 mg/dL — ABNORMAL HIGH (ref 65–99)
Potassium: 3.3 mmol/L — ABNORMAL LOW (ref 3.5–5.2)
Sodium: 146 mmol/L — ABNORMAL HIGH (ref 134–144)

## 2017-08-25 ENCOUNTER — Encounter: Payer: Self-pay | Admitting: *Deleted

## 2017-08-25 ENCOUNTER — Telehealth: Payer: Self-pay | Admitting: *Deleted

## 2017-08-25 DIAGNOSIS — I5032 Chronic diastolic (congestive) heart failure: Secondary | ICD-10-CM

## 2017-08-25 DIAGNOSIS — E1122 Type 2 diabetes mellitus with diabetic chronic kidney disease: Secondary | ICD-10-CM

## 2017-08-25 DIAGNOSIS — E876 Hypokalemia: Secondary | ICD-10-CM | POA: Insufficient documentation

## 2017-08-25 DIAGNOSIS — N183 Chronic kidney disease, stage 3 (moderate): Secondary | ICD-10-CM

## 2017-08-25 HISTORY — DX: Hypokalemia: E87.6

## 2017-08-25 MED ORDER — POTASSIUM CHLORIDE CRYS ER 20 MEQ PO TBCR: 30.0000 meq | EXTENDED_RELEASE_TABLET | Freq: Every day | ORAL | 3 refills | Status: DC

## 2017-08-25 NOTE — Telephone Encounter (Signed)
Pt has been notified of lab results as well as recommendations. Pt is agreeable to increase K+ to 30 meq daily with repeat bmet 09/03/17. Pt advised to f/u with PCP for diabetes. Pt thanked me for my call today.

## 2017-08-25 NOTE — Telephone Encounter (Signed)
-----   Message from Liliane Shi, [place] sent at 08/24/2017  5:17 PM EST ----- Please call the patient Glucose elevated. Renal function stable. Potassium low. PLAN:  1.  Follow-up with PCP for diabetes 2.  Increase potassium to 30 mEq daily 3.  Repeat BMET 7-10 days. Richardson Dopp, PA-C 08/24/2017 5:17 PM

## 2017-08-31 ENCOUNTER — Telehealth: Payer: Self-pay | Admitting: Internal Medicine

## 2017-08-31 ENCOUNTER — Ambulatory Visit (INDEPENDENT_AMBULATORY_CARE_PROVIDER_SITE_OTHER): Payer: Medicare Other | Admitting: Pharmacist

## 2017-08-31 DIAGNOSIS — I82442 Acute embolism and thrombosis of left tibial vein: Secondary | ICD-10-CM

## 2017-08-31 DIAGNOSIS — R600 Localized edema: Secondary | ICD-10-CM

## 2017-08-31 DIAGNOSIS — Z7901 Long term (current) use of anticoagulants: Secondary | ICD-10-CM | POA: Diagnosis not present

## 2017-08-31 LAB — POCT INR: INR: 2

## 2017-08-31 NOTE — Telephone Encounter (Addendum)
Amy,HHRN, calling to clarify medication dosages and discuss pt's current health. She states that pt is not the best to get information from. HHRN reports that "everything is out of wack" and "it is a challenge".  She has been with the patient for the past 2 months and recently noticed the patient forgetting things.   She reports blood sugar this morning was 300 something.  Pt told her that she stopped Levemir and went back to what she was taking and was going to discuss with doctor.  RN tried explaining the Levemir would make her "more steady" but pt not receptive. HHRN has reached out to Dr. Chalmers Cater to see if he would see her again to advise on diabetes/control. SBP have also been elevated 150s or greater. Pt sees PCP, Dr. Jeanie Cooks this Friday  Nurse also explains that pt is not eating like she should.  Nurse has given/reviewed healthier diet control but pt continues eating un-healthly. Last week pt was eating bologna, yesterday [place] and this morning [place].    She has also noticed pt experiencing BLEE & weight increasing again.  She reports pt averages 205-208 lb.  Pt was as low as 200 for awhile and then started to creep up throughout December until now.  Pt is currently taking Lasix 40 QD and Potassium 30 mEq QD.  Nurse reports that pt was taking Lasix 80 QD at one point.  Asking if we can increase to 80 for couple of days to see if LEE improved. RN also asking about possibly drawing a BNP even though pt does not have hx of HF. Nurse also can draw labs at home and can draw this week if ordered.   She can draw BMET that is scheduled to be drawn here on Friday (pt does not recall  this appt) along with anything else Dr. Harrington Challenger feels appropriate. Forwarding to Dr. Harrington Challenger for advisement.

## 2017-08-31 NOTE — Telephone Encounter (Signed)
Pt needs to be seen by PCP to address

## 2017-08-31 NOTE — Telephone Encounter (Signed)
New Message   Amy from [facility] is calling to get lab results from the patients last visit. As well as to clarify medication dosage of the lasix and the potassium. Also wanted to report patients weight is up to 211. She was as low as 204 on 08/16/2017. Please call.

## 2017-09-01 NOTE — Telephone Encounter (Signed)
Get BMET and BNP Pt can be added on with APP or can wait and be added on with me next wk  Re swelling and diureticdose Needs primary MD to address other issues

## 2017-09-02 NOTE — Telephone Encounter (Signed)
Spoke with patient.  Already has lab appointment tomorrow for BMET, added BNP to scheduled labs.  Pt will come next Thursday to see Dr. Harrington Challenger.  Pt aware of time/location.

## 2017-09-03 ENCOUNTER — Other Ambulatory Visit: Payer: Medicare Other | Admitting: *Deleted

## 2017-09-03 DIAGNOSIS — E1122 Type 2 diabetes mellitus with diabetic chronic kidney disease: Secondary | ICD-10-CM

## 2017-09-03 DIAGNOSIS — N183 Chronic kidney disease, stage 3 (moderate): Secondary | ICD-10-CM

## 2017-09-03 DIAGNOSIS — I5032 Chronic diastolic (congestive) heart failure: Secondary | ICD-10-CM

## 2017-09-03 DIAGNOSIS — E876 Hypokalemia: Secondary | ICD-10-CM

## 2017-09-03 LAB — BASIC METABOLIC PANEL
BUN/Creatinine Ratio: 12 (ref 12–28)
BUN: 12 mg/dL (ref 8–27)
CO2: 22 mmol/L (ref 20–29)
Calcium: 8.4 mg/dL — ABNORMAL LOW (ref 8.7–10.3)
Chloride: 103 mmol/L (ref 96–106)
Creatinine, Ser: 0.97 mg/dL (ref 0.57–1.00)
GFR calc Af Amer: 64 mL/min/{1.73_m2} (ref >59)
GFR calc non Af Amer: 56 mL/min/{1.73_m2} — ABNORMAL LOW (ref >59)
Glucose: 214 mg/dL — ABNORMAL HIGH (ref 65–99)
Potassium: 3.3 mmol/L — ABNORMAL LOW (ref 3.5–5.2)
Sodium: 142 mmol/L (ref 134–144)

## 2017-09-06 ENCOUNTER — Telehealth: Payer: Self-pay | Admitting: *Deleted

## 2017-09-06 DIAGNOSIS — I5032 Chronic diastolic (congestive) heart failure: Secondary | ICD-10-CM

## 2017-09-06 MED ORDER — POTASSIUM CHLORIDE CRYS ER 20 MEQ PO TBCR: 20.0000 meq | EXTENDED_RELEASE_TABLET | Freq: Every day | ORAL | 3 refills | Status: DC

## 2017-09-06 NOTE — Telephone Encounter (Signed)
-----   Message from Liliane Shi, [place] sent at 09/06/2017  6:31 AM EST ----- Please call the patient Kidney function normal.  Glucose elevated.  The potassium is low. Increase K+ to 40 mEq in AM and 20 mEq in PM.  BMET 1 week. Richardson Dopp, PA-C    09/06/2017 6:30 AM

## 2017-09-06 NOTE — Telephone Encounter (Signed)
Pt has been notified of lab results. Pt advised to increase K+ to 40 meq in the AM and 20 meq in the PM. BMET to be done in 1 week. Pt states PCP just increased her K+ Friday 09/03/17 to 40 meq in the AM only. Pt is agreeable to plan of care per Richardson Dopp, PA to take K+ 40 meq AM and 20 meq PM. Pt has appt 09/09/17 with Dr. Harrington Challenger. I did advise pt Thursday appt with Dr. Harrington Challenger will most likely be too soon to recheck K+ level, though advised see what Dr. Harrington Challenger says first. If too soon we will get lab work in 1 week 09/13/17. Pt is agreeable to plan of care.

## 2017-09-07 ENCOUNTER — Ambulatory Visit (INDEPENDENT_AMBULATORY_CARE_PROVIDER_SITE_OTHER): Payer: Medicare Other | Admitting: Pharmacist Clinician (PhC)/ Clinical Pharmacy Specialist

## 2017-09-07 DIAGNOSIS — I82442 Acute embolism and thrombosis of left tibial vein: Secondary | ICD-10-CM

## 2017-09-07 DIAGNOSIS — Z7901 Long term (current) use of anticoagulants: Secondary | ICD-10-CM | POA: Diagnosis not present

## 2017-09-07 LAB — POCT INR: INR: 1.7

## 2017-09-09 ENCOUNTER — Ambulatory Visit (INDEPENDENT_AMBULATORY_CARE_PROVIDER_SITE_OTHER): Payer: Medicare Other | Admitting: Internal Medicine

## 2017-09-09 ENCOUNTER — Encounter: Payer: Self-pay | Admitting: Internal Medicine

## 2017-09-09 VITALS — BP 126/58 | HR 64 | Ht 64.0 in | Wt 209.1 lb

## 2017-09-09 DIAGNOSIS — E039 Hypothyroidism, unspecified: Secondary | ICD-10-CM | POA: Diagnosis not present

## 2017-09-09 DIAGNOSIS — I82401 Acute embolism and thrombosis of unspecified deep veins of right lower extremity: Secondary | ICD-10-CM | POA: Diagnosis not present

## 2017-09-09 DIAGNOSIS — I471 Supraventricular tachycardia: Secondary | ICD-10-CM | POA: Diagnosis not present

## 2017-09-09 DIAGNOSIS — I10 Essential (primary) hypertension: Secondary | ICD-10-CM

## 2017-09-09 DIAGNOSIS — I5032 Chronic diastolic (congestive) heart failure: Secondary | ICD-10-CM | POA: Diagnosis not present

## 2017-09-09 NOTE — Patient Instructions (Signed)
Medication Instructions:  Your physician recommends that you continue on your current medications as directed. Please refer to the Current Medication list given to you today.   Labwork: TODAY: BMET, CBC, BNP, MG, TSH  Testing/Procedures: None ordered  Follow-Up: Your physician recommends that you schedule a follow-up appointment in: 3 months with Dr. Harrington Challenger    Any Other Special Instructions Will Be Listed Below (If Applicable).     If you need a refill on your cardiac medications before your next appointment, please call your pharmacy.

## 2017-09-09 NOTE — Progress Notes (Signed)
Cardiology Office Note   Date:  09/09/2017   ID:  Kaylee Mullins, DOB Oct 13, 1935, MRN [id number]  PCP:  Nolene Ebbs, MD  Cardiologist:   Dorris Carnes, MD   Pt returns for eval of LE edema     History of Present Illness: Kaylee Mullins is a 82 y.o. 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 acetaminophen (TYLENOL) 500 MG tablet Take 500 mg by mouth every 6 (six) hours as needed for mild pain.    . B-D UF III MINI PEN NEEDLES 31G X 5 MM MISC USE AS DIRECTED WITH HUMALOG  0  . carvedilol (COREG) 25 MG tablet Take 1 tablet (25 mg total) by mouth 2 (two) times daily. 180 tablet 3  . furosemide (LASIX) 40 MG tablet Take 40 mg by mouth daily.     . insulin detemir (LEVEMIR) 100 UNIT/ML injection Inject 0.07 mLs (7 Units total) into the skin 2 (two) times daily. 10 mL 0  . irbesartan (AVAPRO) 300 MG tablet Take 1 tablet (300 mg total) by mouth daily. 30 tablet 0  . levothyroxine (SYNTHROID, LEVOTHROID) 200 MCG tablet Take 200 mcg by mouth daily before breakfast. For hypothyroidism  99  . NIFEdipine  (PROCARDIA-XL/ADALAT-CC/NIFEDICAL-XL) 30 MG 24 hr tablet Take 30 mg by mouth daily.  2  . pantoprazole (PROTONIX) 40 MG tablet Take 1 tablet (40 mg total) by mouth at bedtime. 30 tablet 0  . potassium chloride SA (KLOR-CON M20) 20 MEQ tablet Take 1 tablet (20 mEq total) by mouth daily. Take 2 tabs in the AM; take 1 tab in the PM 270 tablet 3  . warfarin (COUMADIN) 7.5 MG tablet Take 1 tablet (7.5 mg total) by mouth one time only at 6 PM. 30 tablet 0  . HUMALOG KWIKPEN 100 UNIT/ML KiwkPen USE SLIDING SCALE EVERY 4 HOURS BASED ON BLOOD SUGAR READINGS, SLIDING SCALE INCLUDED  0   No facility-administered medications prior to visit.      Allergies:   Hydralazine hcl; Darvon [propoxyphene hcl]; Nyquil multi-symptom [pseudoeph-doxylamine-dm-apap]; Amlodipine besylate; Nifedipine er; and Metformin and related   Past Medical History:  Diagnosis Date  . Acute CVA (cerebrovascular accident) ([place]) 09/16/2014  . Acute DVT of left tibial vein ([place]) 04/03/2017  . Adenocarcinoma ([place]) 01/12/2017  . 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  Anxiety   . Arthritis    knees  . Black tarry stools    05-14-16 negative for occult blood with ER visit- noted in [place].  . Cancer [facility])    thyroid cancer- surgery and radiation  . Cerebral infarction due to unspecified mechanism   . Chronic kidney disease    questionable mass on kidney. Being followed by Dr Diona Fanti  . Complication of anesthesia    heart rate was really low  . CVA (cerebral infarction) 09/11/2014  . Depression   . Diabetes mellitus    type 2  . Diabetes mellitus without complication ([place]) 08/22/1094   Qualifier: Diagnosis of  By: Loanne Drilling MD, Jacelyn Pi   . Dyslipidemia 04/20/2007   Qualifier: Diagnosis of  By: Loanne Drilling MD, Jacelyn Pi   . Full dentures   . Gastroparesis 02/06/2017  . GERD (gastroesophageal reflux disease) 04/03/2017  . History of CVA (cerebral vascular accident) ([place]) 09/11/2014  . Hypertension   . Hypokalemia 08/25/2017  . Hypothyroidism   .  Lumbar stenosis with neurogenic claudication 02/10/2016  . Osteoarthritis 09/11/2014  . Pneumonia   . Right renal mass 09/13/2014  . Spinal stenosis   . Stroke ([place]) 09/2014   left sided weakness  . SVT (supraventricular tachycardia) ([place]) 07/30/2017    Past Surgical History:  Procedure Laterality Date  . ABDOMINAL HYSTERECTOMY     partial  . cataracts     Removed  11/2015  bilateral  . COLONOSCOPY W/ POLYPECTOMY    . ERCP N/A 05/07/2016   Procedure: ENDOSCOPIC RETROGRADE CHOLANGIOPANCREATOGRAPHY (ERCP);  Surgeon: Clarene Essex, MD;  Location: [facility];  Service: Endoscopy;  Laterality: N/A;  . ERCP N/A 10/16/2016   Procedure: ENDOSCOPIC RETROGRADE CHOLANGIOPANCREATOGRAPHY (ERCP);  Surgeon: Clarene Essex, MD;  Location: [facility] ENDOSCOPY;  Service: Endoscopy;  Laterality: N/A;  . ESOPHAGOGASTRODUODENOSCOPY N/A 02/10/2017   Procedure: ESOPHAGOGASTRODUODENOSCOPY (EGD);  Surgeon: Teena Irani, MD;  Location: [facility] ENDOSCOPY;  Service: Endoscopy;  Laterality: N/A;  . EUS N/A 05/27/2016   Procedure: ESOPHAGEAL ENDOSCOPIC ULTRASOUND (EUS) RADIAL;  Surgeon: Arta Silence, MD;  Location: [facility] ENDOSCOPY;  Service: Endoscopy;  Laterality: N/A;  . FLEXIBLE SIGMOIDOSCOPY  03/29/2012   Procedure: FLEXIBLE SIGMOIDOSCOPY;  Surgeon: Jeryl Columbia, MD;  Location: [facility] ENDOSCOPY;  Service: Endoscopy;  Laterality: N/A;  fleet enema upon arrival  . HOT HEMOSTASIS  03/29/2012   Procedure: HOT HEMOSTASIS (ARGON PLASMA COAGULATION/BICAP);  Surgeon: Jeryl Columbia, MD;  Location: [facility] ENDOSCOPY;  Service: Endoscopy;  Laterality: N/A;  . IR GASTR TUBE CONVERT GASTR-JEJ PER W/FL MOD SED  02/23/2017  . IR GASTROSTOMY TUBE MOD SED  02/16/2017  . IR GENERIC HISTORICAL  06/30/2016   IR RADIOLOGIST EVAL & MGMT 06/30/2016 Aletta Edouard, MD [facility] INTERV RAD  . IR GENERIC HISTORICAL  09/09/2016   IR RADIOLOGIST EVAL & MGMT 09/09/2016 Aletta Edouard, MD [facility] INTERV RAD  . IR IVC FILTER PLMT / S&I /IMG GUID/MOD SED  03/03/2017  . IR PATIENT EVAL  TECH 0-60 MINS  05/12/2017  . LAPAROSCOPY N/A 01/12/2017   Procedure: LAPAROSCOPY DIAGNOSTIC;  Surgeon: Stark Klein, MD;  Location: [facility];  Service: General;  Laterality: N/A;  . LAPAROTOMY N/A 03/10/2017   Procedure: EXPLORATORY LAPAROTOMY Open jejunostomy tube;  Surgeon: Stark Klein, MD;  Location: [facility];  Service: General;  Laterality: N/A;  . LUMBAR LAMINECTOMY/DECOMPRESSION MICRODISCECTOMY N/A 02/10/2016   Procedure: Lumbar three- four Laminectomy;  Surgeon: Kristeen Miss, MD;  Location: [place] NEURO ORS;  Service: Neurosurgery;  Laterality: N/A;  L3-4 Laminectomy  .  LUMBAR SPINE SURGERY     1st surgery "ray cage placed"  . THYROIDECTOMY  2005  . TONSILLECTOMY    . WHIPPLE PROCEDURE N/A 01/12/2017   Procedure: WHIPPLE PROCEDURE;  Surgeon: Stark Klein, MD;  Location: [facility] [facility];  Service: General;  Laterality: N/A;     Social History:  The patient  reports that  has never smoked. she has never used smokeless tobacco. She reports that she does not drink alcohol or use drugs.   Family History:  The patient's family history includes Heart attack in her father; Heart failure in her mother.    ROS:  Please see the history of present illness. All other systems are reviewed and  Negative to the above problem except as noted.    PHYSICAL EXAM: VS:  BP (!) 126/58   Pulse 64   Ht 5\' 4"  (1.626 m)   Wt 209 lb 1.9 oz (94.9 kg)   SpO2 98%   BMI 35.90 kg/m   GEN: Morbidly obese 82 yo  in no acute distress HEENT: normal Neck: JVP normal , carotid bruits, or masses Cardiac: RRR; no murmurs, rubs, or gallops,3+ LE  edema  Respiratory:  clear to auscultation bilaterally, normal work of breathing GI: soft, nontender, nondistended, + BS  No hepatomegaly  MS: no deformity Moving all extremities   Skin: warm and dry, no rash Neuro:  Strength and sensation are intact Psych: euthymic mood, full affect   EKG:  EKG is not ordered today.   Lipid Panel    Component Value Date/Time   CHOL 120  04/09/2017   TRIG 137 04/09/2017   HDL 35 04/09/2017   CHOLHDL 3.0 09/13/2014 0910   VLDL 26 09/13/2014 0910   LDLCALC 58 04/09/2017      Wt Readings from Last 3 Encounters:  09/09/17 209 lb 1.9 oz (94.9 kg)  08/23/17 211 lb 12.8 oz (96.1 kg)  08/06/17 200 lb 14.4 oz (91.1 kg)      ASSESSMENT AND PLAN: \ 1  LE edema/ DVT   Pt with marked edema of legs  Does not have complression socks on today   Will check BMET and BNP   F/U changes based on results Continue coumadin   2  Thyroid  Will check thyrod function with recent labs    3  SVT  No recurrence   4  HTN  BP is OK    3.  HL  ON statin  Lipids were good 1 year ago  Tentative f/u in 3 months    Signed, Dorris Carnes, MD  09/09/2017 9:14 PM    [address], [place], [place]  [address] Phone: [phone]; Fax: [phone]

## 2017-09-10 ENCOUNTER — Telehealth: Payer: Self-pay

## 2017-09-10 DIAGNOSIS — E039 Hypothyroidism, unspecified: Secondary | ICD-10-CM

## 2017-09-10 DIAGNOSIS — E876 Hypokalemia: Secondary | ICD-10-CM

## 2017-09-10 LAB — CBC
Hematocrit: 36.7 % (ref 34.0–46.6)
Hemoglobin: 11.8 g/dL (ref 11.1–15.9)
MCH: 31.6 pg (ref 26.6–33.0)
MCHC: 32.2 g/dL (ref 31.5–35.7)
MCV: 98 fL — ABNORMAL HIGH (ref 79–97)
Platelets: 258 10*3/uL (ref 150–379)
RBC: 3.74 x10E6/uL — ABNORMAL LOW (ref 3.77–5.28)
RDW: 14.7 % (ref 12.3–15.4)
WBC: 5.2 10*3/uL (ref 3.4–10.8)

## 2017-09-10 LAB — BASIC METABOLIC PANEL
BUN/Creatinine Ratio: 15 (ref 12–28)
BUN: 15 mg/dL (ref 8–27)
CO2: 24 mmol/L (ref 20–29)
Calcium: 8.6 mg/dL — ABNORMAL LOW (ref 8.7–10.3)
Chloride: 102 mmol/L (ref 96–106)
Creatinine, Ser: 1.03 mg/dL — ABNORMAL HIGH (ref 0.57–1.00)
GFR calc Af Amer: 60 mL/min/{1.73_m2} (ref >59)
GFR calc non Af Amer: 52 mL/min/{1.73_m2} — ABNORMAL LOW (ref >59)
Glucose: 299 mg/dL — ABNORMAL HIGH (ref 65–99)
Potassium: 3.9 mmol/L (ref 3.5–5.2)
Sodium: 142 mmol/L (ref 134–144)

## 2017-09-10 LAB — PRO B NATRIURETIC PEPTIDE: NT-Pro BNP: 434 pg/mL (ref 0–738)

## 2017-09-10 LAB — TSH: TSH: 16.21 u[IU]/mL — ABNORMAL HIGH (ref 0.450–4.500)

## 2017-09-10 LAB — MAGNESIUM: Magnesium: 1.9 mg/dL (ref 1.6–2.3)

## 2017-09-10 MED ORDER — FUROSEMIDE 80 MG PO TABS: 80.0000 mg | ORAL_TABLET | ORAL | 3 refills | Status: DC

## 2017-09-10 NOTE — Telephone Encounter (Signed)
Spoke with patient in regards to Dr. Harrington Challenger' recommendations:  Labs to be drawn 2/4 and increase Lasix to 80mg  every other day.  Patient verbalized understanding.

## 2017-09-13 ENCOUNTER — Other Ambulatory Visit: Payer: Medicare Other

## 2017-09-14 ENCOUNTER — Ambulatory Visit (INDEPENDENT_AMBULATORY_CARE_PROVIDER_SITE_OTHER): Payer: Medicare Other | Admitting: Pharmacist Clinician (PhC)/ Clinical Pharmacy Specialist

## 2017-09-14 ENCOUNTER — Telehealth: Payer: Self-pay | Admitting: Internal Medicine

## 2017-09-14 DIAGNOSIS — Z7901 Long term (current) use of anticoagulants: Secondary | ICD-10-CM | POA: Diagnosis not present

## 2017-09-14 DIAGNOSIS — I82442 Acute embolism and thrombosis of left tibial vein: Secondary | ICD-10-CM

## 2017-09-14 LAB — POCT INR: INR: 2.4

## 2017-09-14 NOTE — Telephone Encounter (Signed)
Reviewed recent labs with home care nurse.  Notes recorded by Fay Records, MD on 09/10/2017 at 11:42 AM EST Hgb is normal  Magnesium is normal TSH (thyroid) is a little better  Electrolytes are OK WOuld recomm: Increase lasix to 80 mg every other day F/U BMET in 10 days  At this time check free T3, free T4   Pt taking lasix 80 mg every other day and thought she only took potassium on days she takes lasix.  Advised it is ordered daily (2 tabs in AM and 1 tab in PM) and last K was 3.9.  Nurse states that patient told her she had a "growth or something on her thyroid" this may have been years ago.  Currently her PCP is managing diabetes.  Advised her to contact PCP regarding this issue as well.  Nurse will draw labs next Tue 2/5 and have results faxed to [phone].

## 2017-09-14 NOTE — Telephone Encounter (Signed)
New message   Amy Inquiring about furosemide and potassium dosage and also wants to know if pt needs a BMT before her next visit Please call

## 2017-09-20 ENCOUNTER — Other Ambulatory Visit: Payer: Medicare Other

## 2017-09-21 ENCOUNTER — Ambulatory Visit (INDEPENDENT_AMBULATORY_CARE_PROVIDER_SITE_OTHER): Payer: Medicare Other | Admitting: Pharmacist Clinician (PhC)/ Clinical Pharmacy Specialist

## 2017-09-21 DIAGNOSIS — I82442 Acute embolism and thrombosis of left tibial vein: Secondary | ICD-10-CM | POA: Diagnosis not present

## 2017-09-21 DIAGNOSIS — Z7901 Long term (current) use of anticoagulants: Secondary | ICD-10-CM | POA: Diagnosis not present

## 2017-09-21 DIAGNOSIS — I639 Cerebral infarction, unspecified: Secondary | ICD-10-CM | POA: Diagnosis not present

## 2017-09-21 LAB — POCT INR: INR: 2.9

## 2017-09-28 ENCOUNTER — Ambulatory Visit (INDEPENDENT_AMBULATORY_CARE_PROVIDER_SITE_OTHER): Payer: Medicare Other | Admitting: Pharmacist Clinician (PhC)/ Clinical Pharmacy Specialist

## 2017-09-28 DIAGNOSIS — Z7901 Long term (current) use of anticoagulants: Secondary | ICD-10-CM | POA: Diagnosis not present

## 2017-09-28 DIAGNOSIS — I82442 Acute embolism and thrombosis of left tibial vein: Secondary | ICD-10-CM

## 2017-09-28 DIAGNOSIS — I639 Cerebral infarction, unspecified: Secondary | ICD-10-CM

## 2017-09-28 LAB — POCT INR: INR: 1.7

## 2017-10-05 ENCOUNTER — Ambulatory Visit (INDEPENDENT_AMBULATORY_CARE_PROVIDER_SITE_OTHER): Payer: Medicare Other | Admitting: Pharmacist Clinician (PhC)/ Clinical Pharmacy Specialist

## 2017-10-05 DIAGNOSIS — I639 Cerebral infarction, unspecified: Secondary | ICD-10-CM

## 2017-10-05 DIAGNOSIS — I82442 Acute embolism and thrombosis of left tibial vein: Secondary | ICD-10-CM | POA: Diagnosis not present

## 2017-10-05 DIAGNOSIS — Z7901 Long term (current) use of anticoagulants: Secondary | ICD-10-CM

## 2017-10-05 LAB — POCT INR: INR: 2.2

## 2017-10-12 ENCOUNTER — Ambulatory Visit (INDEPENDENT_AMBULATORY_CARE_PROVIDER_SITE_OTHER): Payer: Medicare Other | Admitting: Pharmacist Clinician (PhC)/ Clinical Pharmacy Specialist

## 2017-10-12 DIAGNOSIS — I82442 Acute embolism and thrombosis of left tibial vein: Secondary | ICD-10-CM | POA: Diagnosis not present

## 2017-10-12 DIAGNOSIS — I639 Cerebral infarction, unspecified: Secondary | ICD-10-CM | POA: Diagnosis not present

## 2017-10-12 DIAGNOSIS — Z7901 Long term (current) use of anticoagulants: Secondary | ICD-10-CM | POA: Diagnosis not present

## 2017-10-12 LAB — POCT INR: INR: 3

## 2017-10-18 ENCOUNTER — Ambulatory Visit: Payer: Medicare Other | Admitting: Internal Medicine

## 2017-10-19 ENCOUNTER — Ambulatory Visit (INDEPENDENT_AMBULATORY_CARE_PROVIDER_SITE_OTHER): Payer: Medicare Other | Admitting: Pharmacist

## 2017-10-19 DIAGNOSIS — Z7901 Long term (current) use of anticoagulants: Secondary | ICD-10-CM

## 2017-10-19 DIAGNOSIS — I82442 Acute embolism and thrombosis of left tibial vein: Secondary | ICD-10-CM

## 2017-10-19 LAB — POCT INR: INR: 2.2

## 2017-10-21 ENCOUNTER — Encounter: Payer: Self-pay | Admitting: *Deleted

## 2017-10-21 ENCOUNTER — Telehealth: Payer: Self-pay | Admitting: *Deleted

## 2017-10-21 NOTE — Telephone Encounter (Signed)
I have called Kaylee Mullins several times unable to reach to scheduled an appt with Dr Annamaria Boots regarding her IVC Filter./vm

## 2017-11-02 ENCOUNTER — Ambulatory Visit (INDEPENDENT_AMBULATORY_CARE_PROVIDER_SITE_OTHER): Payer: Medicare Other | Admitting: *Deleted

## 2017-11-02 DIAGNOSIS — I82442 Acute embolism and thrombosis of left tibial vein: Secondary | ICD-10-CM

## 2017-11-02 DIAGNOSIS — Z7901 Long term (current) use of anticoagulants: Secondary | ICD-10-CM

## 2017-11-02 LAB — POCT INR: INR: 2.5

## 2017-11-02 NOTE — Patient Instructions (Signed)
Description   Continue taking 1 tablet daily.   Repeat INR in 2 week. Call us with any medication changes or concerns to Coumadin [facility] # [phone], Main # [phone].

## 2017-11-18 ENCOUNTER — Ambulatory Visit (INDEPENDENT_AMBULATORY_CARE_PROVIDER_SITE_OTHER): Payer: Medicare Other | Admitting: Internal Medicine

## 2017-11-18 ENCOUNTER — Encounter: Payer: Self-pay | Admitting: Internal Medicine

## 2017-11-18 ENCOUNTER — Ambulatory Visit (INDEPENDENT_AMBULATORY_CARE_PROVIDER_SITE_OTHER): Payer: Medicare Other | Admitting: *Deleted

## 2017-11-18 VITALS — BP 130/60 | HR 55 | Ht 64.0 in | Wt 201.8 lb

## 2017-11-18 DIAGNOSIS — E782 Mixed hyperlipidemia: Secondary | ICD-10-CM

## 2017-11-18 DIAGNOSIS — I82403 Acute embolism and thrombosis of unspecified deep veins of lower extremity, bilateral: Secondary | ICD-10-CM

## 2017-11-18 DIAGNOSIS — I5032 Chronic diastolic (congestive) heart failure: Secondary | ICD-10-CM | POA: Diagnosis not present

## 2017-11-18 DIAGNOSIS — I82442 Acute embolism and thrombosis of left tibial vein: Secondary | ICD-10-CM | POA: Diagnosis not present

## 2017-11-18 DIAGNOSIS — I10 Essential (primary) hypertension: Secondary | ICD-10-CM

## 2017-11-18 DIAGNOSIS — E039 Hypothyroidism, unspecified: Secondary | ICD-10-CM | POA: Diagnosis not present

## 2017-11-18 DIAGNOSIS — R609 Edema, unspecified: Secondary | ICD-10-CM | POA: Diagnosis not present

## 2017-11-18 DIAGNOSIS — Z7901 Long term (current) use of anticoagulants: Secondary | ICD-10-CM | POA: Diagnosis not present

## 2017-11-18 LAB — POCT INR: INR: 2.5

## 2017-11-18 NOTE — Progress Notes (Signed)
Cardiology Office Note   Date:  11/18/2017   ID:  Kaylee Mullins, DOB Aug 09, 1936, MRN [id number]  PCP:  Nolene Ebbs, MD  Cardiologist:   Dorris Carnes, MD   Pt rpresents for f/u of LE edema     History of Present Illness: Kaylee Mullins is a 82 y.o. 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   Has had some incontinence   All since Whipple   Trying to get diet that does not exaceberbate  Outpatient Medications Prior to Visit  Medication Sig Dispense Refill  . acetaminophen (TYLENOL) 500 MG tablet Take 500 mg by mouth every 6 (six) hours as needed for mild pain.    . B-D UF III MINI PEN NEEDLES 31G X 5 MM MISC USE AS DIRECTED WITH HUMALOG  0  . furosemide (LASIX) 80 MG tablet Take 1 tablet (80 mg total) by mouth every other day. 90 tablet 3  . insulin detemir (LEVEMIR) 100 UNIT/ML injection Inject 0.07 mLs (7 Units total) into the skin 2 (two) times daily. 10 mL 0  . irbesartan (AVAPRO) 300 MG tablet Take 1 tablet (300 mg total) by mouth daily. 30 tablet 0  . levothyroxine (SYNTHROID, LEVOTHROID) 200 MCG tablet Take 200 mcg by mouth daily before breakfast. For hypothyroidism  99  . NIFEdipine (PROCARDIA-XL/ADALAT-CC/NIFEDICAL-XL) 30 MG 24 hr tablet Take 30 mg by mouth daily.  2  . pantoprazole  (PROTONIX) 40 MG tablet Take 1 tablet (40 mg total) by mouth at bedtime. 30 tablet 0  . potassium chloride SA (KLOR-CON M20) 20 MEQ tablet Take 1 tablet (20 mEq total) by mouth daily. Take 2 tabs in the AM; take 1 tab in the PM 270 tablet 3  . warfarin (COUMADIN) 7.5 MG tablet Take 1 tablet (7.5 mg total) by mouth one time only at 6 PM. 30 tablet 0  . carvedilol (COREG) 25 MG tablet Take 1 tablet (25 mg total) by mouth 2 (two) times daily. 180 tablet 3   No facility-administered medications prior to visit.      Allergies:   Hydralazine hcl; Darvon [propoxyphene hcl]; Nyquil multi-symptom [pseudoeph-doxylamine-dm-apap]; Amlodipine besylate; and Metformin and related   Past Medical History:  Diagnosis Date  . Acute CVA (cerebrovascular accident) ([place]) 09/16/2014  . Acute DVT of left tibial vein ([place]) 04/03/2017  . Adenocarcinoma ([place]) 01/12/2017  . 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 Anxiety   . Arthritis    knees  . Black tarry stools    05-14-16 negative for occult blood with ER visit- noted  in [place].  . Cancer [facility])    thyroid cancer- surgery and radiation  . Cerebral infarction due to unspecified mechanism   . Chronic kidney disease    questionable mass on kidney. Being followed by Dr Diona Fanti  . Complication of anesthesia    heart rate was really low  . CVA (cerebral infarction) 09/11/2014  . Depression   . Diabetes mellitus    type 2  . Diabetes mellitus without complication ([place]) 08/22/1094   Qualifier: Diagnosis of  By: Loanne Drilling MD, Jacelyn Pi   . Dyslipidemia 04/20/2007   Qualifier: Diagnosis of  By: Loanne Drilling MD, Jacelyn Pi   . Full dentures   . Gastroparesis 02/06/2017  . GERD (gastroesophageal reflux disease) 04/03/2017  . History of CVA (cerebral vascular accident) ([place]) 09/11/2014  . Hypertension   . Hypokalemia 08/25/2017  . Hypothyroidism   . Lumbar stenosis with neurogenic claudication 02/10/2016  . Osteoarthritis 09/11/2014  . Pneumonia   . Right renal mass 09/13/2014  . Spinal  stenosis   . Stroke ([place]) 09/2014   left sided weakness  . SVT (supraventricular tachycardia) ([place]) 07/30/2017    Past Surgical History:  Procedure Laterality Date  . ABDOMINAL HYSTERECTOMY     partial  . cataracts     Removed  11/2015  bilateral  . COLONOSCOPY W/ POLYPECTOMY    . ERCP N/A 05/07/2016   Procedure: ENDOSCOPIC RETROGRADE CHOLANGIOPANCREATOGRAPHY (ERCP);  Surgeon: Clarene Essex, MD;  Location: [facility];  Service: Endoscopy;  Laterality: N/A;  . ERCP N/A 10/16/2016   Procedure: ENDOSCOPIC RETROGRADE CHOLANGIOPANCREATOGRAPHY (ERCP);  Surgeon: Clarene Essex, MD;  Location: [facility] ENDOSCOPY;  Service: Endoscopy;  Laterality: N/A;  . ESOPHAGOGASTRODUODENOSCOPY N/A 02/10/2017   Procedure: ESOPHAGOGASTRODUODENOSCOPY (EGD);  Surgeon: Teena Irani, MD;  Location: [facility] ENDOSCOPY;  Service: Endoscopy;  Laterality: N/A;  . EUS N/A 05/27/2016   Procedure: ESOPHAGEAL ENDOSCOPIC ULTRASOUND (EUS) RADIAL;  Surgeon: Arta Silence, MD;  Location: [facility] ENDOSCOPY;  Service: Endoscopy;  Laterality: N/A;  . FLEXIBLE SIGMOIDOSCOPY  03/29/2012   Procedure: FLEXIBLE SIGMOIDOSCOPY;  Surgeon: Jeryl Columbia, MD;  Location: [facility] ENDOSCOPY;  Service: Endoscopy;  Laterality: N/A;  fleet enema upon arrival  . HOT HEMOSTASIS  03/29/2012   Procedure: HOT HEMOSTASIS (ARGON PLASMA COAGULATION/BICAP);  Surgeon: Jeryl Columbia, MD;  Location: [facility] ENDOSCOPY;  Service: Endoscopy;  Laterality: N/A;  . IR GASTR TUBE CONVERT GASTR-JEJ PER W/FL MOD SED  02/23/2017  . IR GASTROSTOMY TUBE MOD SED  02/16/2017  . IR GENERIC HISTORICAL  06/30/2016   IR RADIOLOGIST EVAL & MGMT 06/30/2016 Aletta Edouard, MD [facility] INTERV RAD  . IR GENERIC HISTORICAL  09/09/2016   IR RADIOLOGIST EVAL & MGMT 09/09/2016 Aletta Edouard, MD [facility] INTERV RAD  . IR IVC FILTER PLMT / S&I /IMG GUID/MOD SED  03/03/2017  . IR PATIENT EVAL TECH 0-60 MINS  05/12/2017  . LAPAROSCOPY N/A 01/12/2017   Procedure: LAPAROSCOPY DIAGNOSTIC;  Surgeon: Stark Klein, MD;  Location: [facility];   Service: General;  Laterality: N/A;  . LAPAROTOMY N/A 03/10/2017   Procedure: EXPLORATORY LAPAROTOMY Open jejunostomy tube;  Surgeon: Stark Klein, MD;  Location: [facility];  Service: General;  Laterality: N/A;  . LUMBAR LAMINECTOMY/DECOMPRESSION MICRODISCECTOMY N/A 02/10/2016   Procedure: Lumbar three- four Laminectomy;  Surgeon: Kristeen Miss, MD;  Location: [facility]URO ORS;  Service: Neurosurgery;  Laterality: N/A;  L3-4 Laminectomy  . LUMBAR SPINE SURGERY     1st surgery "ray cage placed"  . THYROIDECTOMY  2005  . TONSILLECTOMY    . WHIPPLE PROCEDURE  N/A 01/12/2017   Procedure: WHIPPLE PROCEDURE;  Surgeon: Stark Klein, MD;  Location: [facility];  Service: General;  Laterality: N/A;     Social History:  The patient  reports that she has never smoked. She has never used smokeless tobacco. She reports that she does not drink alcohol or use drugs.   Family History:  The patient's family history includes Heart attack in her father; Heart failure in her mother.    ROS:  Please see the history of present illness. All other systems are reviewed and  Negative to the above problem except as noted.    PHYSICAL EXAM: VS:  BP 130/60   Pulse (!) 55   Ht 5\' 4"  (1.626 m)   Wt 201 lb 12.8 oz (91.5 kg)   SpO2 97%   BMI 34.64 kg/m   Gen   Pt is an obese  82 yo  in no acute distress  HEENT: normal  Neck: JVP normal , carotid bruits, or masses Cardiac: RRR; no murmurs, rubs, or gallops  2+ LE  edema   (soft) Respiratory:  clear to auscultation bilaterally, normal work of breathing GI: soft, nontender, nondistended, + BS  No hepatomegaly  MS: no deformity Moving all extremities   Skin: warm and dry, no rash Neuro:  Strength and sensation are intact Psych: euthymic mood, full affect   EKG:  EKG is not ordered today.   Lipid Panel    Component Value Date/Time   CHOL 120 04/09/2017   TRIG 137 04/09/2017   HDL 35 04/09/2017   CHOLHDL 3.0 09/13/2014 0910   VLDL 26 09/13/2014 0910   LDLCALC 58  04/09/2017      Wt Readings from Last 3 Encounters:  11/18/17 201 lb 12.8 oz (91.5 kg)  09/09/17 209 lb 1.9 oz (94.9 kg)  08/23/17 211 lb 12.8 oz (96.1 kg)      ASSESSMENT AND PLAN: \ 1  LE edema/  Hx DVT    Persests   Does not appear as severe as last visit   Wt is down some  I would recomm rechedking labs for further decisions re meds   2  Thyroid  Will check TSH  3  SVT  No recurrence   4  HTN  BP is OK    5.  HL  ON statin  6   GI   Recomm she contact [facility]    Tentative f/u in 3 months    Signed, Dorris Carnes, MD  11/18/2017 2:51 PM    [facility] [place], [place], [place]  [address] Phone: [phone]; Fax: [phone]

## 2017-11-18 NOTE — Patient Instructions (Signed)
Description   Continue taking 1 tablet daily.   Repeat INR in 3 weeks. Call us with any medication changes or concerns to Coumadin Clinic # [phone], Main # [phone].

## 2017-11-18 NOTE — Patient Instructions (Signed)
Your physician recommends that you continue on your current medications as directed. Please refer to the Current Medication list given to you today.  Your physician recommends that you return for lab work today (BMET, CBC, TSH, BNP)  Your physician recommends that you schedule a follow-up appointment in: EARLY September WITH DR. Harrington Challenger.

## 2017-11-19 ENCOUNTER — Telehealth: Payer: Self-pay | Admitting: Internal Medicine

## 2017-11-19 LAB — CBC
Hematocrit: 38.2 % (ref 34.0–46.6)
Hemoglobin: 12 g/dL (ref 11.1–15.9)
MCH: 29.8 pg (ref 26.6–33.0)
MCHC: 31.4 g/dL — ABNORMAL LOW (ref 31.5–35.7)
MCV: 95 fL (ref 79–97)
Platelets: 304 10*3/uL (ref 150–379)
RBC: 4.03 x10E6/uL (ref 3.77–5.28)
RDW: 14.7 % (ref 12.3–15.4)
WBC: 6.4 10*3/uL (ref 3.4–10.8)

## 2017-11-19 LAB — BASIC METABOLIC PANEL
BUN/Creatinine Ratio: 11 — ABNORMAL LOW (ref 12–28)
BUN: 11 mg/dL (ref 8–27)
CO2: 20 mmol/L (ref 20–29)
Calcium: 8.5 mg/dL — ABNORMAL LOW (ref 8.7–10.3)
Chloride: 107 mmol/L — ABNORMAL HIGH (ref 96–106)
Creatinine, Ser: 1.03 mg/dL — ABNORMAL HIGH (ref 0.57–1.00)
GFR calc Af Amer: 60 mL/min/{1.73_m2} (ref >59)
GFR calc non Af Amer: 52 mL/min/{1.73_m2} — ABNORMAL LOW (ref >59)
Glucose: 282 mg/dL — ABNORMAL HIGH (ref 65–99)
Potassium: 3.6 mmol/L (ref 3.5–5.2)
Sodium: 143 mmol/L (ref 134–144)

## 2017-11-19 LAB — TSH: TSH: 18 u[IU]/mL — ABNORMAL HIGH (ref 0.450–4.500)

## 2017-11-19 LAB — PRO B NATRIURETIC PEPTIDE: NT-Pro BNP: 421 pg/mL (ref 0–738)

## 2017-11-19 NOTE — Telephone Encounter (Signed)
New Message   [facility] calling to see if we received a fax for orders for the patient. Please call

## 2017-11-22 ENCOUNTER — Other Ambulatory Visit: Payer: Self-pay | Admitting: *Deleted

## 2017-11-22 MED ORDER — FUROSEMIDE 80 MG PO TABS: ORAL_TABLET | ORAL | Status: DC

## 2017-11-22 MED ORDER — POTASSIUM CHLORIDE CRYS ER 20 MEQ PO TBCR: EXTENDED_RELEASE_TABLET | ORAL | 3 refills | Status: AC

## 2017-11-22 NOTE — Progress Notes (Signed)
See lab result note 11/18/17

## 2017-11-22 NOTE — Telephone Encounter (Signed)
[facility] orders have been signed by Dr. Harrington Challenger.   Today, they were faxed to [facility].

## 2017-11-24 ENCOUNTER — Ambulatory Visit
Admission: RE | Admit: 2017-11-24 | Discharge: 2017-11-24 | Disposition: A | Discharge status: Home / Self Care | Payer: Medicare Other | Source: Ambulatory Visit | Attending: Interventional Radiology | Admitting: Interventional Radiology

## 2017-11-24 DIAGNOSIS — Z95828 Presence of other vascular implants and grafts: Secondary | ICD-10-CM

## 2017-11-24 HISTORY — PX: IR RADIOLOGIST EVAL & MGMT: IMG5224

## 2017-11-24 NOTE — Progress Notes (Signed)
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 Has done quite well in recovery Still with some B leg and feet swelling --even before Whipple and events of 2018  Back on coumadin per Dr Harrington Challenger Here today for evaluation for possible removal of filter  LE [place] today: IMPRESSION: Chronic changes from prior bilateral DVT with residual scattered wall thickening. Vessels remain compressible bilaterally. No significant acute recurrent occlusive DVT in either extremity.      Past Medical History:  Diagnosis Date  . Acute CVA (cerebrovascular accident) ([place]) 09/16/2014  . Acute DVT of left tibial vein ([place]) 04/03/2017  . Adenocarcinoma ([place]) 01/12/2017  . 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 Anxiety   . Arthritis    knees  . Black tarry stools    05-14-16 negative for occult blood with ER visit- noted in [place].  . Cancer [facility])    thyroid cancer- surgery and radiation  . Cerebral infarction due to unspecified mechanism   . Chronic kidney disease    questionable mass on kidney. Being followed by Dr Diona Fanti  . Complication of anesthesia    heart rate was really low  . CVA (cerebral infarction) 09/11/2014  . Depression   . Diabetes mellitus    type 2  . Diabetes mellitus without complication ([place]) 10/23/1015   Qualifier: Diagnosis of  By: Loanne Drilling MD, Jacelyn Pi   . Dyslipidemia 04/20/2007   Qualifier: Diagnosis of  By: Loanne Drilling MD, Jacelyn Pi   . Full dentures   . Gastroparesis 02/06/2017  . GERD (gastroesophageal reflux disease) 04/03/2017  . History of CVA (cerebral vascular accident) ([place]) 09/11/2014  .  Hypertension   . Hypokalemia 08/25/2017  . Hypothyroidism   . Lumbar stenosis with neurogenic claudication 02/10/2016  . Osteoarthritis 09/11/2014  . Pneumonia   . Right renal mass 09/13/2014  . Spinal stenosis   . Stroke ([place]) 09/2014   left sided weakness  . SVT (supraventricular tachycardia) ([place]) 07/30/2017    Past Surgical History:  Procedure Laterality Date  . ABDOMINAL HYSTERECTOMY     partial  . cataracts     Removed  11/2015  bilateral  . COLONOSCOPY W/ POLYPECTOMY    . ERCP N/A 05/07/2016   Procedure: ENDOSCOPIC RETROGRADE CHOLANGIOPANCREATOGRAPHY (ERCP);  Surgeon: Clarene Essex, MD;  Location: [facility] ENDOSCOPY;  Service: Endoscopy;  Laterality: N/A;  . ERCP N/A 10/16/2016   Procedure: ENDOSCOPIC RETROGRADE CHOLANGIOPANCREATOGRAPHY (ERCP);  Surgeon: Clarene Essex, MD;  Location: [facility] ENDOSCOPY;  Service: Endoscopy;  Laterality: N/A;  . ESOPHAGOGASTRODUODENOSCOPY N/A 02/10/2017   Procedure: ESOPHAGOGASTRODUODENOSCOPY (EGD);  Surgeon: Teena Irani, MD;  Location: [facility] ENDOSCOPY;  Service: Endoscopy;  Laterality: N/A;  . EUS N/A 05/27/2016   Procedure: ESOPHAGEAL ENDOSCOPIC ULTRASOUND (EUS) RADIAL;  Surgeon: Arta Silence, MD;  Location: [facility] ENDOSCOPY;  Service: Endoscopy;  Laterality: N/A;  . FLEXIBLE SIGMOIDOSCOPY  03/29/2012   Procedure: FLEXIBLE SIGMOIDOSCOPY;  Surgeon: Jeryl [place], MD;  Location: [facility] ENDOSCOPY;  Service: Endoscopy;  Laterality: N/A;  fleet enema upon arrival  . HOT HEMOSTASIS  03/29/2012   Procedure: HOT HEMOSTASIS (ARGON PLASMA COAGULATION/BICAP);  Surgeon: Jeryl [place], MD;  Location: [facility] ENDOSCOPY;  Service: Endoscopy;  Laterality: N/A;  .  IR GASTR TUBE CONVERT GASTR-JEJ PER W/FL MOD SED  02/23/2017  . IR GASTROSTOMY TUBE MOD SED  02/16/2017  . IR GENERIC HISTORICAL  06/30/2016   IR RADIOLOGIST EVAL & MGMT 06/30/2016 Aletta Edouard, MD [facility] INTERV RAD  . IR GENERIC HISTORICAL  09/09/2016   IR RADIOLOGIST EVAL & MGMT 09/09/2016 Aletta Edouard, MD [facility] INTERV RAD  . IR IVC  FILTER PLMT / S&I /IMG GUID/MOD SED  03/03/2017  . IR PATIENT EVAL TECH 0-60 MINS  05/12/2017  . LAPAROSCOPY N/A 01/12/2017   Procedure: LAPAROSCOPY DIAGNOSTIC;  Surgeon: Stark Klein, MD;  Location: [place];  Service: General;  Laterality: N/A;  . LAPAROTOMY N/A 03/10/2017   Procedure: EXPLORATORY LAPAROTOMY Open jejunostomy tube;  Surgeon: Stark Klein, MD;  Location: [place];  Service: General;  Laterality: N/A;  . LUMBAR LAMINECTOMY/DECOMPRESSION MICRODISCECTOMY N/A 02/10/2016   Procedure: Lumbar three- four Laminectomy;  Surgeon: Kristeen Miss, MD;  Location: [place] NEURO ORS;  Service: Neurosurgery;  Laterality: N/A;  L3-4 Laminectomy  . LUMBAR SPINE SURGERY     1st surgery "ray cage placed"  . THYROIDECTOMY  2005  . TONSILLECTOMY    . WHIPPLE PROCEDURE N/A 01/12/2017   Procedure: WHIPPLE PROCEDURE;  Surgeon: Stark Klein, MD;  Location: [place];  Service: General;  Laterality: N/A;    Allergies: Hydralazine hcl; Amlodipine besylate; Darvon [propoxyphene hcl]; Nyquil multi-symptom [pseudoeph-doxylamine-dm-apap]; and Metformin and related  Medications: Prior to Admission medications   Medication Sig Start Date End Date Taking? Authorizing Provider  acetaminophen (TYLENOL) 500 MG tablet Take 500 mg by mouth every 6 (six) hours as needed for mild pain.    [provider]  B-D UF III MINI PEN NEEDLES 31G X 5 MM MISC USE AS DIRECTED WITH HUMALOG 04/28/17   [provider]  carvedilol (COREG) 25 MG tablet Take 1 tablet (25 mg total) by mouth 2 (two) times daily. 07/30/17 10/28/17  Sueanne Margarita, MD  furosemide (LASIX) 80 MG tablet Take as directed.  Take 80 mg daily alternating with 40 mg daily 11/22/17   Fay Records, MD  insulin detemir (LEVEMIR) 100 UNIT/ML injection Inject 0.07 mLs (7 Units total) into the skin 2 (two) times daily. Patient taking differently: Inject 20 Units into the skin 2 (two) times daily. Pt is on a sliding scale 08/06/17   Patrecia Pour, Christean Grief, MD  irbesartan  (AVAPRO) 300 MG tablet Take 1 tablet (300 mg total) by mouth daily. 02/04/17   Stark Klein, MD  LEVEMIR FLEXTOUCH 100 UNIT/ML Pen Inject 20 Units into the skin as needed. 10/02/17   [provider]  levothyroxine (SYNTHROID, LEVOTHROID) 200 MCG tablet Take 200 mcg by mouth daily before breakfast. For hypothyroidism 11/11/15   [provider]  NIFEdipine (PROCARDIA-XL/ADALAT-CC/NIFEDICAL-XL) 30 MG 24 hr tablet Take 30 mg by mouth daily. 07/04/17   [provider]  pantoprazole (PROTONIX) 40 MG tablet Take 1 tablet (40 mg total) by mouth at bedtime. 02/04/17   Stark Klein, MD  potassium chloride SA (KLOR-CON M20) 20 MEQ tablet Take 3 tablets daily alternating with 1 tablet daily.  Take with furosemide. 11/22/17   Fay Records, MD  rosuvastatin (CRESTOR) 20 MG tablet Take 20 mg by mouth daily. 10/31/17   [provider]  warfarin (COUMADIN) 7.5 MG tablet Take 1 tablet (7.5 mg total) by mouth one time only at 6 PM. 08/06/17   Patrecia Pour, Christean Grief, MD     Family History  Problem Relation Age of Onset  . Heart failure  Mother   . Heart attack Father     Social History   Socioeconomic History  . Marital status: Widowed    Spouse name: Not on file  . Number of children: Not on file  . Years of education: Not on file  . Highest education level: Not on file  Occupational History  . Occupation: housewife    Comment: college housekeeping  Social Needs  . Financial resource strain: Not on file  . Food insecurity:    Worry: Not on file    Inability: Not on file  . Transportation needs:    Medical: Not on file    Non-medical: Not on file  Tobacco Use  . Smoking status: Never Smoker  . Smokeless tobacco: Never Used  Substance and Sexual Activity  . Alcohol use: No  . Drug use: No  . Sexual activity: Never  Lifestyle  . Physical activity:    Days per week: Not on file    Minutes per session: Not on file  . Stress: Not on file  Relationships  . Social  connections:    Talks on phone: Not on file    Gets together: Not on file    Attends religious service: Not on file    Active member of club or organization: Not on file    Attends meetings of clubs or organizations: Not on file    Relationship status: Not on file  Other Topics Concern  . Not on file  Social History Narrative   Widowed,  retired, 3 children living, 2 children deceased   Caffeine use - soda every few days   Right handed   Pt lives alone.  Using cane when out and about.    Admitted to [facility] 03/29/17   Never smoked   Alcohol none    Full Code     Vital Signs: BP (!) 147/68 (BP Location: Right Arm, Patient Position: Sitting, Cuff Size: Normal)   Pulse 62   Temp 97.8 F (36.6 C)   Resp 14   Ht 5\' 4"  (1.626 m)   Wt 199 lb (90.3 kg)   SpO2 98%   BMI 34.16 kg/m   Physical Exam  Constitutional: She is oriented to person, place, and time.  Musculoskeletal: Normal range of motion. She exhibits edema. She exhibits no tenderness.  Bilat legs and feet swelling Pt states swelling is actually less than usual Ambulates in hall without help-- slowly but steady  Neurological: She is alert and oriented to person, place, and time.  Skin: Skin is warm and dry.  Psychiatric: She has a normal mood and affect. Her behavior is normal.  Nursing note and vitals reviewed.   Imaging: US Venous Img Lower Bilateral  Result Date: 11/24/2017 CLINICAL DATA:  History of DVTs, IVC filter EXAM: BILATERAL LOWER EXTREMITY VENOUS DOPPLER ULTRASOUND TECHNIQUE: Gray-scale sonography with graded compression, as well as color Doppler and duplex ultrasound were performed to evaluate the lower extremity deep venous systems from the level of the common femoral vein and including the common femoral, femoral, profunda femoral, popliteal and calf veins including the posterior tibial, peroneal and gastrocnemius veins when visible. The superficial great saphenous vein was also  interrogated. Spectral Doppler was utilized to evaluate flow at rest and with distal augmentation maneuvers in the common femoral, femoral and popliteal veins. COMPARISON:  None. FINDINGS: RIGHT LOWER EXTREMITY There is diffuse residual chronic wall thickening from prior DVT throughout the common femoral, profunda femoral, femoral and popliteal veins.  Vessels remain compressible. No acute occlusive thrombus. Limited assessment of the calf veins. LEFT LOWER EXTREMITY There is similar diffuse residual chronic wall thickening from prior DVT throughout the left common femoral, profunda femoral, femoral, and popliteal veins. Again, vessels remain compressible. No acute occlusive thrombus. Limited visualization of the calf veins IMPRESSION: Chronic changes from prior bilateral DVT with residual scattered wall thickening. Vessels remain compressible bilaterally. No significant acute recurrent occlusive DVT in either extremity. Electronically Signed   By: Jerilynn Mages.  Shick M.D.   On: 11/24/2017 14:54    Labs:  CBC: Recent Labs    08/03/17 0438 08/04/17 0239 09/09/17 1528 11/18/17 1531  WBC 6.7 6.4 5.2 6.4  HGB 11.1* 10.8* 11.8 12.0  HCT 34.7* 33.7* 36.7 38.2  PLT 248 249 258 304    COAGS: Recent Labs    02/16/17 0456  10/12/17 10/19/17 11/02/17 1007 11/18/17 1417  INR 1.24   < > 3.0 2.2 2.5 2.5  APTT 42*  --   --   --   --   --    < > = values in this interval not displayed.    BMP: Recent Labs    08/23/17 1301 09/03/17 1151 09/09/17 1528 11/18/17 1531  NA 146* 142 142 143  K 3.3* 3.3* 3.9 3.6  CL 105 103 102 107*  CO2 19* 22 24 20   GLUCOSE 362* 214* 299* 282*  BUN 16 12 15 11   CALCIUM 8.7 8.4* 8.6* 8.5*  CREATININE 1.08* 0.97 1.03* 1.03*  GFRNONAA 49* 56* 52* 52*  GFRAA 56* 64 60 60    LIVER FUNCTION TESTS: Recent Labs    03/18/17 0337 03/22/17 0543 03/25/17 0437 03/29/17 0508 04/09/17 04/20/17 04/27/17  BILITOT 1.0 0.7 0.6 0.6  --   --   --   AST 23 21 38 26 21 21 27   ALT  22 18 31 21 15 14 16   ALKPHOS 106 92 99 82 85 82 91  PROT 6.5 7.1 7.6 7.4  --   --   --   ALBUMIN 2.2* 2.6* 2.8* 2.9*  --   --   --     Assessment:  IVC filter placed 03/03/17 US shows no acute DVT B Resumed coumadin daily Call into Dr Harrington Challenger for plan- not available til Monday Pt leaning toward keeping filter for now-- wants to discuss with Dr Harrington Challenger.  SignedLavonia Drafts, PA-C 11/24/2017, 3:04 PM   Please refer to Dr. Annamaria Boots attestation of this note for management and plan.

## 2017-12-13 ENCOUNTER — Ambulatory Visit (INDEPENDENT_AMBULATORY_CARE_PROVIDER_SITE_OTHER): Payer: Medicare Other | Admitting: *Deleted

## 2017-12-13 DIAGNOSIS — I82442 Acute embolism and thrombosis of left tibial vein: Secondary | ICD-10-CM | POA: Diagnosis not present

## 2017-12-13 DIAGNOSIS — Z7901 Long term (current) use of anticoagulants: Secondary | ICD-10-CM

## 2017-12-13 LAB — POCT INR: INR: 1.9

## 2017-12-13 NOTE — Patient Instructions (Signed)
Description   Today take 1.5 tablets, then Continue taking 1 tablet daily.   Repeat INR in 3 weeks. Call us with any medication changes or concerns to Coumadin Clinic # [phone], Main # [phone].

## 2017-12-24 ENCOUNTER — Inpatient Hospital Stay: Payer: Medicare Other | Attending: Oncology | Admitting: Oncology

## 2017-12-24 ENCOUNTER — Telehealth: Payer: Self-pay | Admitting: Oncology

## 2017-12-24 ENCOUNTER — Inpatient Hospital Stay: Payer: Medicare Other

## 2017-12-24 VITALS — BP 155/58 | HR 60 | Temp 98.2°F | Resp 18 | Ht 64.0 in | Wt 201.6 lb

## 2017-12-24 DIAGNOSIS — C241 Malignant neoplasm of ampulla of Vater: Secondary | ICD-10-CM

## 2017-12-24 DIAGNOSIS — E119 Type 2 diabetes mellitus without complications: Secondary | ICD-10-CM | POA: Diagnosis not present

## 2017-12-24 DIAGNOSIS — I10 Essential (primary) hypertension: Secondary | ICD-10-CM | POA: Insufficient documentation

## 2017-12-24 DIAGNOSIS — Z8589 Personal history of malignant neoplasm of other organs and systems: Secondary | ICD-10-CM | POA: Diagnosis not present

## 2017-12-24 DIAGNOSIS — Z85528 Personal history of other malignant neoplasm of kidney: Secondary | ICD-10-CM | POA: Diagnosis not present

## 2017-12-24 NOTE — Progress Notes (Signed)
  [facility] OFFICE PROGRESS NOTE   Diagnosis: Ampullary carcinoma  INTERVAL HISTORY:   [name] returns for a scheduled visit.  She feels well.  Good appetite.  She has fecal incontinence when she has a soft stool.  There is an IVC filter in place.  She is considering whether to have the filter removed.  She continues Coumadin anticoagulation.  Objective:  Vital signs in last 24 hours:  Blood pressure (!) 155/58, pulse 60, temperature 98.2 F (36.8 C), temperature source Oral, resp. rate 18, height 5\' 4"  (1.626 m), weight 201 lb 9.6 oz (91.4 kg), SpO2 100 %.    HEENT: Neck without mass Lymphatics: No cervical, supraclavicular, axillary, or inguinal nodes Resp: End inspiratory rhonchi at the left posterior base, no respiratory distress Cardio: Regular rate and rhythm GI: No hepatomegaly, no mass, nontender Vascular: 2+ edema below the knees on the left greater than right     Lab Results:  Lab Results  Component Value Date   WBC 6.4 11/18/2017   HGB 12.0 11/18/2017   HCT 38.2 11/18/2017   MCV 95 11/18/2017   PLT 304 11/18/2017   NEUTROABS 6.1 03/29/2017    Medications: I have reviewed the patient's current medications.   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 CT abdomen/pelvis 10/23/2016-ampullary mass, single mildly enlarged porta hepatis lymph node, no evidence of distant metastatic disease ? Status post pancreaticoduodenectomy 01/12/2017; pT3pN2 2. Biliary obstruction secondary to #1, status post placement of a metal, bile duct stent on 10/23/2016  3. 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 Renal cell carcinoma-status post ablation of a right renal mass 07/15/2016, biopsy confirmed papillary renal cell carcinoma, Fuhrman grade 3  5. CVA in 2016  6. History of thyroid cancer-status post thyroidectomy and  radioactive iodine in 2005  7. Diabetes  8.Hypertension   9.     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  We will follow-up on the CA 19-9 from today.  She will return for an office visit in 6 months. We discussed the risk/benefit of removing the IVC filter.  She plans to discuss this further with Dr. Harrington Challenger.  She will remain on Coumadin anticoagulation while the filter is in place.  Betsy Coder, MD  12/24/2017  12:23 PM

## 2017-12-24 NOTE — Telephone Encounter (Signed)
Scheduled appt per 5/10 los - sent reminder letter in the mail with appt date and time.

## 2017-12-25 LAB — CANCER ANTIGEN 19-9: CA 19-9: 145 U/mL — ABNORMAL HIGH (ref 0–35)

## 2017-12-27 ENCOUNTER — Telehealth: Payer: Self-pay | Admitting: *Deleted

## 2017-12-27 DIAGNOSIS — C241 Malignant neoplasm of ampulla of Vater: Secondary | ICD-10-CM

## 2017-12-27 NOTE — Telephone Encounter (Signed)
Called pt with CA19-9 result, per MD note below. Pt understands to expect call from schedulers with 6 week lab appt.

## 2017-12-27 NOTE — Telephone Encounter (Signed)
-----   Message from Ladell Pier, MD sent at 12/27/2017  4:34 PM EDT ----- Please call patient, ca19-9 is mildly elevated, but lower than 1 year ago, repeat 6 weeks, will schedule CTs to be sure there is no recurrence of cancer if there is a progressive rise

## 2017-12-29 ENCOUNTER — Telehealth: Payer: Self-pay | Admitting: Oncology

## 2017-12-29 NOTE — Telephone Encounter (Signed)
Appointment scheduled Calendar/Letter mailed to patient per 5/13 sch msg

## 2017-12-29 NOTE — Telephone Encounter (Signed)
Appointment scheduled and patient notified. Letter/Calendar mailed to patient per 5/13 sch msg

## 2018-01-03 ENCOUNTER — Ambulatory Visit (INDEPENDENT_AMBULATORY_CARE_PROVIDER_SITE_OTHER): Payer: Medicare Other | Admitting: *Deleted

## 2018-01-03 DIAGNOSIS — Z7901 Long term (current) use of anticoagulants: Secondary | ICD-10-CM

## 2018-01-03 DIAGNOSIS — I82442 Acute embolism and thrombosis of left tibial vein: Secondary | ICD-10-CM

## 2018-01-03 LAB — POCT INR: INR: 1.4 — AB (ref 2.0–3.0)

## 2018-01-03 NOTE — Patient Instructions (Signed)
Description   Today and tomorrow take 1.5 tablets (since you missed a dose), then continue taking 1 tablet daily.   Repeat INR in 1 week. Call us with any medication changes or concerns to [facility] # [phone], Main # [phone].

## 2018-01-04 ENCOUNTER — Other Ambulatory Visit (HOSPITAL_COMMUNITY): Payer: Self-pay | Admitting: Interventional Radiology

## 2018-01-04 DIAGNOSIS — C641 Malignant neoplasm of right kidney, except renal pelvis: Secondary | ICD-10-CM

## 2018-01-11 ENCOUNTER — Ambulatory Visit (INDEPENDENT_AMBULATORY_CARE_PROVIDER_SITE_OTHER): Payer: Medicare Other | Admitting: *Deleted

## 2018-01-11 DIAGNOSIS — Z7901 Long term (current) use of anticoagulants: Secondary | ICD-10-CM | POA: Diagnosis not present

## 2018-01-11 DIAGNOSIS — I82442 Acute embolism and thrombosis of left tibial vein: Secondary | ICD-10-CM | POA: Diagnosis not present

## 2018-01-11 LAB — POCT INR: INR: 2.2 (ref 2.0–3.0)

## 2018-01-11 NOTE — Patient Instructions (Signed)
Description   Continue taking 1 tablet daily.  Repeat INR in 2 weeks. Call us with any medication changes or concerns to [facility] # [phone], Main # [phone].

## 2018-01-25 ENCOUNTER — Ambulatory Visit (INDEPENDENT_AMBULATORY_CARE_PROVIDER_SITE_OTHER): Payer: Medicare Other | Admitting: *Deleted

## 2018-01-25 DIAGNOSIS — Z8673 Personal history of transient ischemic attack (TIA), and cerebral infarction without residual deficits: Secondary | ICD-10-CM

## 2018-01-25 DIAGNOSIS — I82442 Acute embolism and thrombosis of left tibial vein: Secondary | ICD-10-CM

## 2018-01-25 DIAGNOSIS — Z7901 Long term (current) use of anticoagulants: Secondary | ICD-10-CM | POA: Diagnosis not present

## 2018-01-25 DIAGNOSIS — I639 Cerebral infarction, unspecified: Secondary | ICD-10-CM | POA: Diagnosis not present

## 2018-01-25 DIAGNOSIS — I82403 Acute embolism and thrombosis of unspecified deep veins of lower extremity, bilateral: Secondary | ICD-10-CM

## 2018-01-25 DIAGNOSIS — I63139 Cerebral infarction due to embolism of unspecified carotid artery: Secondary | ICD-10-CM

## 2018-01-25 DIAGNOSIS — Z5181 Encounter for therapeutic drug level monitoring: Secondary | ICD-10-CM

## 2018-01-25 LAB — POCT INR: INR: 2.2 (ref 2.0–3.0)

## 2018-01-25 NOTE — Patient Instructions (Signed)
Description   Continue taking 1 tablet daily.  Repeat INR in 3 weeks. Call us with any medication changes or concerns to [facility]mad[facility] Clinic # [phone], Main # [phone].

## 2018-02-07 ENCOUNTER — Inpatient Hospital Stay: Payer: Medicare Other | Attending: Oncology

## 2018-02-07 DIAGNOSIS — Z8589 Personal history of malignant neoplasm of other organs and systems: Secondary | ICD-10-CM | POA: Diagnosis not present

## 2018-02-07 DIAGNOSIS — C241 Malignant neoplasm of ampulla of Vater: Secondary | ICD-10-CM

## 2018-02-07 LAB — BASIC METABOLIC PANEL - CANCER CENTER ONLY
Anion gap: 10 (ref 3–11)
BUN: 16 mg/dL (ref 7–26)
CO2: 27 mmol/L (ref 22–29)
Calcium: 8.4 mg/dL (ref 8.4–10.4)
Chloride: 102 mmol/L (ref 98–109)
Creatinine: 1.23 mg/dL — ABNORMAL HIGH (ref 0.60–1.10)
GFR, Est AFR Am: 47 mL/min — ABNORMAL LOW (ref >60)
GFR, Estimated: 41 mL/min — ABNORMAL LOW (ref >60)
Glucose, Bld: 317 mg/dL — ABNORMAL HIGH (ref 70–140)
Potassium: 3.3 mmol/L — ABNORMAL LOW (ref 3.5–5.1)
Sodium: 139 mmol/L (ref 136–145)

## 2018-02-08 ENCOUNTER — Telehealth: Payer: Self-pay | Admitting: *Deleted

## 2018-02-08 LAB — CANCER ANTIGEN 19-9: CA 19-9: 156 U/mL — ABNORMAL HIGH (ref 0–35)

## 2018-02-08 NOTE — Telephone Encounter (Signed)
Telephone call to patient to advise lab results as directed below.  

## 2018-02-10 ENCOUNTER — Telehealth: Payer: Self-pay | Admitting: Internal Medicine

## 2018-02-10 NOTE — Telephone Encounter (Signed)
Home (bolded) number is busy. Mobile number- went to voice mail.  Left message for patient to call back.

## 2018-02-10 NOTE — Telephone Encounter (Signed)
New message   Patient requesting call from nurse. Patient has questions about a "filter" in her chest. Patient wants to know if she needs additional testing for blood clots

## 2018-02-18 ENCOUNTER — Ambulatory Visit (INDEPENDENT_AMBULATORY_CARE_PROVIDER_SITE_OTHER): Payer: Medicare Other | Admitting: *Deleted

## 2018-02-18 DIAGNOSIS — Z7901 Long term (current) use of anticoagulants: Secondary | ICD-10-CM | POA: Diagnosis not present

## 2018-02-18 DIAGNOSIS — Z5181 Encounter for therapeutic drug level monitoring: Secondary | ICD-10-CM

## 2018-02-18 DIAGNOSIS — I82442 Acute embolism and thrombosis of left tibial vein: Secondary | ICD-10-CM | POA: Diagnosis not present

## 2018-02-18 DIAGNOSIS — Z8673 Personal history of transient ischemic attack (TIA), and cerebral infarction without residual deficits: Secondary | ICD-10-CM

## 2018-02-18 DIAGNOSIS — I82403 Acute embolism and thrombosis of unspecified deep veins of lower extremity, bilateral: Secondary | ICD-10-CM

## 2018-02-18 LAB — POCT INR: INR: 1.9 — AB (ref 2.0–3.0)

## 2018-02-18 NOTE — Patient Instructions (Signed)
Description   Today July 5th take 1 and 1/2 tablets then continue taking 1 tablet daily.  Repeat INR in 2 weeks. Call us with any medication changes or concerns to [facility]mad[facility] Clinic # [phone], Main # [phone].

## 2018-02-22 NOTE — Telephone Encounter (Signed)
I left message again on voicemail that if she wanted to call back to to discuss questions re: her IVC filter that she could call back and I would try to answer them.

## 2018-03-04 ENCOUNTER — Ambulatory Visit (INDEPENDENT_AMBULATORY_CARE_PROVIDER_SITE_OTHER): Payer: Medicare Other

## 2018-03-04 DIAGNOSIS — I82442 Acute embolism and thrombosis of left tibial vein: Secondary | ICD-10-CM | POA: Diagnosis not present

## 2018-03-04 DIAGNOSIS — Z7901 Long term (current) use of anticoagulants: Secondary | ICD-10-CM

## 2018-03-04 LAB — POCT INR: INR: 2.7 (ref 2.0–3.0)

## 2018-03-04 NOTE — Patient Instructions (Signed)
Description   Continue taking 1 tablet daily.  Repeat INR in 4 weeks. Call us with any medication changes or concerns to [facility]madin Clinic # [phone], Main # [phone].

## 2018-03-18 ENCOUNTER — Other Ambulatory Visit: Payer: Self-pay | Admitting: Gastroenterology

## 2018-03-18 DIAGNOSIS — R1084 Generalized abdominal pain: Secondary | ICD-10-CM

## 2018-03-23 ENCOUNTER — Encounter: Payer: Self-pay | Admitting: Radiology

## 2018-03-28 ENCOUNTER — Other Ambulatory Visit: Payer: Medicare Other

## 2018-03-30 ENCOUNTER — Ambulatory Visit
Admission: RE | Admit: 2018-03-30 | Discharge: 2018-03-30 | Disposition: A | Discharge status: Home / Self Care | Payer: Medicare Other | Source: Ambulatory Visit | Attending: Gastroenterology | Admitting: Gastroenterology

## 2018-03-30 DIAGNOSIS — R1084 Generalized abdominal pain: Secondary | ICD-10-CM

## 2018-03-30 MED ORDER — IOPAMIDOL (ISOVUE-300) INJECTION 61%
100.0000 mL | Freq: Once | INTRAVENOUS | Status: AC | PRN
  Administered 2018-03-30: 100 mL via INTRAVENOUS

## 2018-04-01 ENCOUNTER — Ambulatory Visit (INDEPENDENT_AMBULATORY_CARE_PROVIDER_SITE_OTHER): Payer: Medicare Other | Admitting: Pharmacist

## 2018-04-01 DIAGNOSIS — Z7901 Long term (current) use of anticoagulants: Secondary | ICD-10-CM | POA: Diagnosis not present

## 2018-04-01 DIAGNOSIS — I82442 Acute embolism and thrombosis of left tibial vein: Secondary | ICD-10-CM | POA: Diagnosis not present

## 2018-04-01 LAB — POCT INR: INR: 2 (ref 2.0–3.0)

## 2018-04-01 NOTE — Patient Instructions (Signed)
Continue taking 1 tablet daily.  Repeat INR in 4 weeks. Call us with any medication changes or concerns to Coumadin Clinic # [phone], Main # [phone].

## 2018-04-07 ENCOUNTER — Ambulatory Visit
Admission: RE | Admit: 2018-04-07 | Discharge: 2018-04-07 | Disposition: A | Discharge status: Home / Self Care | Payer: Medicare Other | Source: Ambulatory Visit | Attending: Interventional Radiology | Admitting: Interventional Radiology

## 2018-04-07 ENCOUNTER — Encounter: Payer: Self-pay | Admitting: Internal Medicine

## 2018-04-07 DIAGNOSIS — C641 Malignant neoplasm of right kidney, except renal pelvis: Secondary | ICD-10-CM

## 2018-04-07 HISTORY — PX: IR RADIOLOGIST EVAL & MGMT: IMG5224

## 2018-04-07 NOTE — Progress Notes (Signed)
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  History of Present Illness: Kaylee Mullins is a 82 y.o. female who returns for follow-up to review post ablation appearance of the right kidney after cryoablation of a biopsy-proven papillary renal carcinoma on 07/15/2016.  Since that time, the patient also underwent a Whipple procedure by Dr. 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  The carcinoma involved 5 of 11 resected lymph nodes with lymphovascular and perineural invasion present.  She did develop a postoperative left calf vein DVT and rectus sheath hematoma after anticoagulation.  She had an IVC filter placed on 03/03/2017.  She has now resumed anticoagulation.  She was referred to Dr. [name] who felt that she was in clinical remission and she has not undergone additional treatment for the ampullary carcinoma.  She does complain of some left-sided abdominal and back pain.  She has no urinary complaints.  A CT of the abdomen was performed on 03/30/2018 ordered by Dr. Watt Climes.   Past Medical History:  Diagnosis Date  . Acute CVA (cerebrovascular accident) ([place]) 09/16/2014  . Acute DVT of left tibial vein ([place]) 04/03/2017  . Adenocarcinoma ([place]) 01/12/2017  . 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 Anxiety   . Arthritis    knees  . Black tarry stools    05-14-16 negative for occult blood with ER visit- noted in [place].  . Cancer [facility])    thyroid cancer- surgery and radiation  . Cerebral infarction due to unspecified mechanism   . Chronic kidney disease    questionable mass on kidney. Being followed by Dr Diona Fanti  . Complication of anesthesia    heart rate was really low  . CVA (cerebral infarction) 09/11/2014  . Depression   . Diabetes mellitus    type 2  . Diabetes mellitus without complication ([place]) 11/21/1593   Qualifier: Diagnosis of  By: Loanne Drilling MD, Jacelyn Pi   . Dyslipidemia 04/20/2007   Qualifier: Diagnosis of  By: Loanne Drilling MD, Jacelyn Pi   . Full dentures   . Gastroparesis 02/06/2017  . GERD (gastroesophageal reflux disease) 04/03/2017  . History of CVA (cerebral vascular accident) ([place]) 09/11/2014  . Hypertension   . Hypokalemia 08/25/2017  . Hypothyroidism   . Lumbar stenosis with neurogenic claudication 02/10/2016  . Osteoarthritis 09/11/2014  . Pneumonia   . Right renal mass 09/13/2014  . Spinal stenosis   . Stroke ([place]) 09/2014   left sided weakness  . SVT (supraventricular tachycardia) ([place]) 07/30/2017    Past Surgical History:  Procedure Laterality Date  . ABDOMINAL HYSTERECTOMY     partial  . cataracts     Removed  11/2015  bilateral  . COLONOSCOPY W/ POLYPECTOMY    . ERCP N/A 05/07/2016   Procedure: ENDOSCOPIC RETROGRADE CHOLANGIOPANCREATOGRAPHY (ERCP);  Surgeon: Clarene [place], MD;  Location: [facility] ENDOSCOPY;  Service: Endoscopy;  Laterality: N/A;  . ERCP N/A 10/16/2016   Procedure: ENDOSCOPIC RETROGRADE CHOLANGIOPANCREATOGRAPHY (ERCP);  Surgeon: Clarene [place], MD;  Location: [facility] ENDOSCOPY;  Service: Endoscopy;  Laterality: N/A;  . ESOPHAGOGASTRODUODENOSCOPY N/A 02/10/2017   Procedure: ESOPHAGOGASTRODUODENOSCOPY (EGD);  Surgeon: Teena Irani, MD;  Location: [facility] ENDOSCOPY;  Service: Endoscopy;  Laterality: N/A;  . EUS N/A 05/27/2016   Procedure: ESOPHAGEAL ENDOSCOPIC ULTRASOUND (EUS) RADIAL;  Surgeon: Arta Silence, MD;  Location: [facility] ENDOSCOPY;  Service: Endoscopy;  Laterality: N/A;  . FLEXIBLE SIGMOIDOSCOPY  03/29/2012   Procedure: FLEXIBLE SIGMOIDOSCOPY;  Surgeon: Jeryl [place], MD;  Location:  [facility] ENDOSCOPY;  Service: Endoscopy;  Laterality: N/A;  fleet enema upon arrival  . HOT HEMOSTASIS  03/29/2012   Procedure: HOT HEMOSTASIS (ARGON PLASMA COAGULATION/BICAP);  Surgeon: Jeryl [place], MD;  Location: [facility] ENDOSCOPY;  Service: Endoscopy;  Laterality: N/A;  . IR GASTR TUBE CONVERT GASTR-JEJ PER W/FL MOD SED  02/23/2017  . IR GASTROSTOMY TUBE MOD SED  02/16/2017  . IR GENERIC HISTORICAL   06/30/2016   IR RADIOLOGIST EVAL & MGMT 06/30/2016 Aletta Edouard, MD [facility] INTERV RAD  . IR GENERIC HISTORICAL  09/09/2016   IR RADIOLOGIST EVAL & MGMT 09/09/2016 Aletta Edouard, MD [facility] INTERV RAD  . IR IVC FILTER PLMT / S&I /IMG GUID/MOD SED  03/03/2017  . IR PATIENT EVAL TECH 0-60 MINS  05/12/2017  . IR RADIOLOGIST EVAL & MGMT  11/24/2017  . IR RADIOLOGIST EVAL & MGMT  04/07/2018  . LAPAROSCOPY N/A 01/12/2017   Procedure: LAPAROSCOPY DIAGNOSTIC;  Surgeon: Stark Klein, MD;  Location: [place];  Service: General;  Laterality: N/A;  . LAPAROTOMY N/A 03/10/2017   Procedure: EXPLORATORY LAPAROTOMY Open jejunostomy tube;  Surgeon: Stark Klein, MD;  Location: [place];  Service: General;  Laterality: N/A;  . LUMBAR LAMINECTOMY/DECOMPRESSION MICRODISCECTOMY N/A 02/10/2016   Procedure: Lumbar three- four Laminectomy;  Surgeon: Kristeen Miss, MD;  Location: [facility] NEURO ORS;  Service: Neurosurgery;  Laterality: N/A;  L3-4 Laminectomy  . LUMBAR SPINE SURGERY     1st surgery "ray cage placed"  . THYROIDECTOMY  2005  . TONSILLECTOMY    . WHIPPLE PROCEDURE N/A 01/12/2017   Procedure: WHIPPLE PROCEDURE;  Surgeon: Stark Klein, MD;  Location: [place];  Service: General;  Laterality: N/A;    Allergies: Hydralazine hcl; Amlodipine besylate; Darvon [propoxyphene hcl]; Nyquil multi-symptom [pseudoeph-doxylamine-dm-apap]; and Metformin and related  Medications: Prior to Admission medications   Medication Sig Start Date End Date Taking? Authorizing Provider  ACCU-CHEK AVIVA PLUS test strip TEST 3 TIMES A DAY AS DIRECTED 12/12/17  Yes [provider]  acetaminophen (TYLENOL) 500 MG tablet Take 500 mg by mouth every 6 (six) hours as needed for mild pain.   Yes [provider]  B-D UF III MINI PEN NEEDLES 31G X 5 MM MISC USE AS DIRECTED WITH HUMALOG 04/28/17  Yes [provider]  furosemide (LASIX) 80 MG tablet Take as directed.  Take 80 mg daily alternating with 40 mg daily 11/22/17  Yes Fay Records, MD  insulin detemir (LEVEMIR) 100 UNIT/ML injection Inject 0.07 mLs (7 Units total) into the skin 2 (two) times daily. Patient taking differently: Inject 20 Units into the skin 2 (two) times daily. Pt is on a sliding scale 08/06/17  Yes Patrecia Pour, Christean Grief, MD  irbesartan (AVAPRO) 300 MG tablet Take 1 tablet (300 mg total) by mouth daily. 02/04/17  Yes Stark Klein, MD  LEVEMIR FLEXTOUCH 100 UNIT/ML Pen Inject 20 Units into the skin as needed. 10/02/17  Yes [provider]  levothyroxine (SYNTHROID, LEVOTHROID) 200 MCG tablet Take 200 mcg by mouth daily before breakfast. For hypothyroidism 11/11/15  Yes [provider]  NIFEdipine (PROCARDIA-XL/ADALAT-CC/NIFEDICAL-XL) 30 MG 24 hr tablet Take 30 mg by mouth daily. 07/04/17  Yes [provider]  pantoprazole (PROTONIX) 40 MG tablet Take 1 tablet (40 mg total) by mouth at bedtime. 02/04/17  Yes Stark Klein, MD  potassium chloride SA (KLOR-CON M20) 20 MEQ tablet Take 3 tablets daily alternating with 1 tablet daily.  Take with furosemide. 11/22/17  Yes Fay Records, MD  rosuvastatin (CRESTOR) 20  MG tablet Take 20 mg by mouth daily. 10/31/17  Yes [provider]  Vitamin D, Ergocalciferol, (DRISDOL) 50000 units CAPS capsule Take 50,000 Units by mouth once a week. 12/14/17  Yes [provider]  warfarin (COUMADIN) 7.5 MG tablet Take 1 tablet (7.5 mg total) by mouth one time only at 6 PM. 08/06/17  Yes Patrecia Pour, Christean Grief, MD  carvedilol (COREG) 25 MG tablet Take 1 tablet (25 mg total) by mouth 2 (two) times daily. 07/30/17 10/28/17  Sueanne Margarita, MD     Family History  Problem Relation Age of Onset  . Heart failure Mother   . Heart attack Father     Social History   Socioeconomic History  . Marital status: Widowed    Spouse name: Not on file  . Number of children: Not on file  . Years of education: Not on file  . Highest education level: Not on file  Occupational History  . Occupation:  housewife    Comment: college housekeeping  Social Needs  . Financial resource strain: Not on file  . Food insecurity:    Worry: Not on file    Inability: Not on file  . Transportation needs:    Medical: Not on file    Non-medical: Not on file  Tobacco Use  . Smoking status: Never Smoker  . Smokeless tobacco: Never Used  Substance and Sexual Activity  . Alcohol use: No  . Drug use: No  . Sexual activity: Never  Lifestyle  . Physical activity:    Days per week: Not on file    Minutes per session: Not on file  . Stress: Not on file  Relationships  . Social connections:    Talks on phone: Not on file    Gets together: Not on file    Attends religious service: Not on file    Active member of club or organization: Not on file    Attends meetings of clubs or organizations: Not on file    Relationship status: Not on file  Other Topics Concern  . Not on file  Social History Narrative   Widowed,  retired, 3 children living, 2 children deceased   Caffeine use - soda every few days   Right handed   Pt lives alone.  Using cane when out and about.    Admitted to [facility] 03/29/17   Never smoked   Alcohol none    Full Code    ECOG Status: 1 - Symptomatic but completely ambulatory  Review of Systems: A 12 point ROS discussed and pertinent positives are indicated in the HPI above.  All other systems are negative.  Review of Systems  Constitutional: Negative.   HENT: Negative.   Respiratory: Negative.   Gastrointestinal: Positive for abdominal pain. Negative for abdominal distention, constipation, diarrhea, nausea and vomiting.  Genitourinary: Negative.   Musculoskeletal: Negative.   Neurological: Negative.     Vital Signs: BP (!) 176/93   Pulse 93   Temp 98.4 F (36.9 C) (Oral)   Resp 16   Ht _0  (1.626 m)   Wt 90.7 kg   SpO2 100%   BMI 34.33 kg/m   Physical Exam  Constitutional: She is oriented to person, place, and time. No distress.    Abdominal: Soft. She exhibits no distension and no mass. There is no tenderness. There is no rebound and no guarding.  Neurological: She is alert and oriented to person, place, and time.  Skin: She is  not diaphoretic.  Vitals reviewed.   Imaging: Ct Abdomen Pelvis W Contrast  Result Date: 03/30/2018 CLINICAL DATA:  Abdominal pain for several months. Personal history of Whipple procedure for ampullary carcinoma and cryoablation for previous right renal cell carcinoma. EXAM: CT ABDOMEN AND PELVIS WITH CONTRAST TECHNIQUE: Multidetector CT imaging of the abdomen and pelvis was performed using the standard protocol following bolus administration of intravenous contrast. CONTRAST:  197m ISOVUE-300 IOPAMIDOL (ISOVUE-300) INJECTION 61% COMPARISON:  08/03/2017 FINDINGS: Lower Chest: Stable 5 mm pulmonary nodule in right middle lobe, consistent with benign etiology. Hepatobiliary: Severe hepatic steatosis again seen. No hepatic masses identified. Pancreas: No mass or inflammatory changes. Stent is again seen within the pancreatic duct. Spleen: Within normal limits in size and appearance. Adrenals/Urinary Tract: Normal adrenal glands and left kidney. Further decrease in size of cryoablation defect is noted in the upper pole the right kidney. No evidence of renal mass or hydronephrosis. Unremarkable unopacified urinary bladder. Stomach/Bowel: No evidence of obstruction, inflammatory process or abnormal fluid collections. Normal appendix visualized. Large amount of stool again seen throughout the colon. Vascular/Lymphatic: Mild retroperitoneal lymphadenopathy is seen in the left paraaortic region, with largest lymph node measuring 12 mm short axis. This is new since previous study and suspicious for metastatic disease. No other pathologically enlarged lymph nodes are identified. Aortic atherosclerosis. IVC filter remains in appropriate position. Reproductive: Prior hysterectomy noted. Adnexal regions are unremarkable  in appearance. Other: Near complete resolution of left rectus sheath hematoma since prior study. Stable small right inguinal hernia containing only fat. Musculoskeletal:  No suspicious bone lesions identified. IMPRESSION: Ablation defect again seen within the upper pole of the right kidney. No evidence of recurrent renal mass. New mild left paraaortic retroperitoneal lymphadenopathy, highly suspicious for metastatic disease. No evidence of left renal mass. Near complete resolution of left rectus sheath hematoma since prior study. Severe hepatic steatosis. Electronically Signed   By: JEarle GellM.D.   On: 03/30/2018 14:07   Ir Radiologist Eval & Mgmt  Result Date: 04/07/2018 Please refer to notes tab for details about interventional procedure. (Op Note)   Labs:  CBC: Recent Labs    08/03/17 0438 08/04/17 0239 09/09/17 1528 11/18/17 1531  WBC 6.7 6.4 5.2 6.4  HGB 11.1* 10.8* 11.8 12.0  HCT 34.7* 33.7* 36.7 38.2  PLT 248 249 258 304    COAGS: Recent Labs    01/25/18 1509 02/18/18 1513 03/04/18 1527 04/01/18 1457  INR 2.2 1.9* 2.7 2.0    BMP: Recent Labs    09/03/17 1151 09/09/17 1528 11/18/17 1531 02/07/18 1226  NA 142 142 143 139  K 3.3* 3.9 3.6 3.3*  CL 103 102 107* 102  CO2 _0 GLUCOSE 214* 299* 282* 317*  BUN _1 CALCIUM 8.4* 8.6* 8.5* 8.4  CREATININE 0.97 1.03* 1.03* 1.23*  GFRNONAA 56* 52* 52* 41*  GFRAA 64 60 60 47*    LIVER FUNCTION TESTS: Recent Labs    04/09/17 04/20/17 04/27/17  AST _2 ALT _3 ALKPHOS 85 82 91    Assessment and Plan:  I met with Mrs. Kostelnik and reviewed the recent CT dated 03/30/2018 with her.  This shows continued retraction of scar tissue at the upper pole of the right kidney after prior cryoablation with a barely detectable small ablation defect now present.  There is no evidence of recurrent papillary carcinoma or residual enhancing tissue.  No new renal lesions are identified.  There is  development of new left para-aortic retroperitoneal lymphadenopathy located just posterior to the left renal artery and extending inferior to the left renal artery.  On the coronal images it appears that there are at least 3 adjacent lymph nodes and it is somewhat difficult to measure them.  The largest lymph node is at least 12 mm in short axis.  On a CT of the abdomen dated 08/03/2017, only a few tiny lymph nodes were present in this location measuring approximately 5 mm in short axis.  I told Mrs. Tift that these lymph nodes are concerning for potential metastatic disease from her ampullary carcinoma.  It would be highly unusual that her papillary renal carcinoma would metastasize from the right kidney to left para-aortic lymph nodes.  The cryoablation also resulted in what appears to be complete treatment of the papillary carcinoma with no evidence of recurrence most 2 years after treatment. I told Mrs. Weldy that I would make Dr. Benay Spice aware of these findings and I recommended she follow-up with him.  Electronically SignedAletta Edouard T 04/07/2018, 4:23 PM     I spent a total of 15 Minutes in face to face in clinical consultation, greater than 50% of which was counseling/coordinating care status post cryoablation of a right papillary renal carcinoma.

## 2018-04-08 ENCOUNTER — Telehealth: Payer: Self-pay | Admitting: Oncology

## 2018-04-08 NOTE — Telephone Encounter (Signed)
Spoke to PT regarding upcoming aug appts per 8/22 sch message.  °

## 2018-04-14 ENCOUNTER — Telehealth: Payer: Self-pay | Admitting: Oncology

## 2018-04-14 ENCOUNTER — Inpatient Hospital Stay: Payer: Medicare Other | Admitting: Oncology

## 2018-04-14 ENCOUNTER — Telehealth: Payer: Self-pay | Admitting: *Deleted

## 2018-04-14 NOTE — Telephone Encounter (Signed)
TC from patient stating that she is unable to come in to see Dr. Benay Spice today as she is feeling dizzy and doesn't trust herself to drive. Unable to get transportation from family or friends. She wishes to re-schedule to next week. She states her BP is ok, taking her meds as ordered.  No other s/s

## 2018-04-14 NOTE — Telephone Encounter (Signed)
R/s appt per 8/29 sch message - pt is aware of appt date and time.

## 2018-04-25 ENCOUNTER — Inpatient Hospital Stay: Payer: Medicare Other | Attending: Oncology | Admitting: Nurse Practitioner

## 2018-04-25 ENCOUNTER — Encounter: Payer: Self-pay | Admitting: Nurse Practitioner

## 2018-04-25 ENCOUNTER — Inpatient Hospital Stay: Payer: Medicare Other

## 2018-04-25 ENCOUNTER — Telehealth: Payer: Self-pay

## 2018-04-25 VITALS — BP 173/73 | HR 62 | Temp 98.7°F | Resp 20 | Ht 64.0 in | Wt 200.1 lb

## 2018-04-25 DIAGNOSIS — I10 Essential (primary) hypertension: Secondary | ICD-10-CM | POA: Diagnosis not present

## 2018-04-25 DIAGNOSIS — E119 Type 2 diabetes mellitus without complications: Secondary | ICD-10-CM | POA: Insufficient documentation

## 2018-04-25 DIAGNOSIS — C241 Malignant neoplasm of ampulla of Vater: Secondary | ICD-10-CM

## 2018-04-25 DIAGNOSIS — Z85528 Personal history of other malignant neoplasm of kidney: Secondary | ICD-10-CM | POA: Insufficient documentation

## 2018-04-25 NOTE — Telephone Encounter (Signed)
Printed avs and calender of upcoming appointment. Per 9/9 los 

## 2018-04-25 NOTE — Progress Notes (Addendum)
  [place] OFFICE PROGRESS NOTE   Diagnosis: Ampullary carcinoma  INTERVAL HISTORY:   Kaylee Mullins returns for follow-up.  She has a poor energy level.  She describes her appetite as "okay".  She thinks her weight is likely stable.  She has had a few recent episodes of nausea/vomiting.  No diarrhea.  She has intermittent pain at the left side.  Objective:  Vital signs in last 24 hours:  Blood pressure (!) 173/73, pulse 62, temperature 98.7 F (37.1 C), temperature source Oral, resp. rate 20, height 5\' 4"  (1.626 m), weight 200 lb 1.6 oz (90.8 kg), SpO2 100 %.    HEENT: No thrush or ulcers. Lymphatics: No palpable cervical or supraclavicular lymph nodes. Resp: Lungs clear bilaterally. Cardio: Regular rate and rhythm. GI: Abdomen soft and nontender.  No hepatomegaly.  No spinal megaly.  No mass. Vascular: Trace edema at the lower legs bilaterally right slightly greater than left.   Lab Results:  Lab Results  Component Value Date   WBC 6.4 11/18/2017   HGB 12.0 11/18/2017   HCT 38.2 11/18/2017   MCV 95 11/18/2017   PLT 304 11/18/2017   NEUTROABS 6.1 03/29/2017    Imaging:  No results found.  Medications: I have reviewed the patient's current medications.  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 CT abdomen/pelvis 10/23/2016-ampullary mass, single mildly enlarged porta hepatis lymph node, no evidence of distant metastatic disease ? Status post pancreaticoduodenectomy 01/12/2017; pT3pN2 ? CT abdomen/pelvis 03/30/2018- ablation defect within the upper pole of the right kidney.  No evidence of recurrent renal mass.  New mild left periaortic retroperitoneal lymphadenopathy. 2. Biliary obstruction secondary to #1, status post placement of a metal, bile duct stent on 10/23/2016  3. 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 Renal cell  carcinoma-status post ablation of a right renal mass 07/15/2016, biopsy confirmed papillary renal cell carcinoma, Fuhrman grade 3  5. CVA in 2016  6. History of thyroid cancer-status post thyroidectomy and radioactive iodine in 2005  7. Diabetes  8.Hypertension   9.     Left lower extremity deep vein thrombosis June 2019, IVC filter placed July 2018 after she was diagnosed with a rectus hematoma while on Lovenox  Disposition: Kaylee Mullins has a history of ampullary carcinoma and also renal cell carcinoma.  Recent CT scans chest/abdomen showed retroperitoneal adenopathy.  We reviewed the CT report/images with Kaylee Mullins at today's visit.  Dr. Benay Spice recommends repeating a CT scan at an approximate 6-month interval.  She will return for a follow-up visit 2 to 3 days later to review the results.  She will contact the office in the interim with any problems.  Patient seen with Dr. Benay Spice.    [name] ANP/GNP-BC   04/25/2018  2:26 PM  This was a shared visit with [name].  We reviewed the CT images with Kaylee Mullins.  There is a small left retroperitoneal lymph node, but no other evidence of disease progression.  We decided to obtain a 60-month interval CT and continue observation.  We will consider a biopsy if the retroperitoneal lesion enlarges or there is other evidence of metastatic disease.  [name], MD

## 2018-04-26 LAB — CANCER ANTIGEN 19-9: CA 19-9: 194 U/mL — ABNORMAL HIGH (ref 0–35)

## 2018-04-29 ENCOUNTER — Ambulatory Visit (INDEPENDENT_AMBULATORY_CARE_PROVIDER_SITE_OTHER): Payer: Medicare Other | Admitting: Internal Medicine

## 2018-04-29 ENCOUNTER — Encounter: Payer: Self-pay | Admitting: Internal Medicine

## 2018-04-29 ENCOUNTER — Ambulatory Visit (INDEPENDENT_AMBULATORY_CARE_PROVIDER_SITE_OTHER): Payer: Medicare Other

## 2018-04-29 VITALS — BP 122/70 | HR 52 | Ht 64.0 in | Wt 199.8 lb

## 2018-04-29 DIAGNOSIS — E039 Hypothyroidism, unspecified: Secondary | ICD-10-CM

## 2018-04-29 DIAGNOSIS — I5032 Chronic diastolic (congestive) heart failure: Secondary | ICD-10-CM | POA: Diagnosis not present

## 2018-04-29 DIAGNOSIS — I471 Supraventricular tachycardia, unspecified: Secondary | ICD-10-CM

## 2018-04-29 DIAGNOSIS — I10 Essential (primary) hypertension: Secondary | ICD-10-CM

## 2018-04-29 DIAGNOSIS — R609 Edema, unspecified: Secondary | ICD-10-CM

## 2018-04-29 DIAGNOSIS — E782 Mixed hyperlipidemia: Secondary | ICD-10-CM

## 2018-04-29 DIAGNOSIS — Z7901 Long term (current) use of anticoagulants: Secondary | ICD-10-CM

## 2018-04-29 DIAGNOSIS — I82442 Acute embolism and thrombosis of left tibial vein: Secondary | ICD-10-CM | POA: Diagnosis not present

## 2018-04-29 LAB — POCT INR: INR: 2.4 (ref 2.0–3.0)

## 2018-04-29 NOTE — Patient Instructions (Signed)
Description   Continue on same dosage 1 tablet daily.  Repeat INR in 6 weeks. Call us with any medication changes or concerns to [facility] # [phone], Main # [phone].

## 2018-04-29 NOTE — Patient Instructions (Signed)
Your physician recommends that you continue on your current medications as directed. Please refer to the Current Medication list given to you today.  Your physician recommends that you return for lab work today (cbc, tsh, bnp, cmet)  Your physician wants you to follow-up in: 6-7 months with Dr. Harrington Challenger. You will receive a reminder letter in the mail two months in advance. If you don't receive a letter, please call our office to schedule the follow-up appointment.

## 2018-04-29 NOTE — Progress Notes (Addendum)
Cardiology Office Note   Date:  04/29/2018   ID:  Kaylee Mullins, DOB September 25, 1935, MRN [id number]  PCP:  Nolene Ebbs, MD  Cardiologist:   Dorris Carnes, MD   Pt rpresents for f/u of   HTN      History of Present Illness: Kaylee Mullins is a 82 y.o. 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    Denies palptiaotns     Only dizzy with change in position     Outpatient Medications Prior to Visit  Medication Sig Dispense Refill  . ACCU-CHEK AVIVA PLUS test strip TEST 3 TIMES A DAY AS DIRECTED  1  . acetaminophen (TYLENOL) 500 MG tablet Take 500 mg by mouth every 6 (six) hours as needed for mild pain.    . B-D UF III MINI PEN NEEDLES 31G X 5 MM MISC USE AS DIRECTED WITH HUMALOG  0  . furosemide (LASIX) 80 MG tablet Take as directed.  Take 80 mg daily alternating with 40 mg daily    . insulin detemir (LEVEMIR) 100 UNIT/ML injection Inject 0.07 mLs (7 Units total) into the skin 2 (two) times daily. (Patient taking differently: Inject 20 Units into the skin 2 (two) times daily. Pt is on a sliding scale) 10 mL 0  . irbesartan (AVAPRO) 300 MG tablet Take 1 tablet (300 mg total) by mouth daily. 30 tablet 0  . LEVEMIR FLEXTOUCH 100 UNIT/ML Pen Inject 20 Units into the skin as needed.  5  . levothyroxine (SYNTHROID, LEVOTHROID) 200 MCG tablet Take 200 mcg by mouth daily before breakfast. For  hypothyroidism  99  . NIFEdipine (PROCARDIA-XL/ADALAT-CC/NIFEDICAL-XL) 30 MG 24 hr tablet Take 30 mg by mouth daily.  2  . pantoprazole (PROTONIX) 40 MG tablet Take 1 tablet (40 mg total) by mouth at bedtime. 30 tablet 0  . potassium chloride SA (KLOR-CON M20) 20 MEQ tablet Take 3 tablets daily alternating with 1 tablet daily.  Take with furosemide. 270 tablet 3  . rosuvastatin (CRESTOR) 20 MG tablet Take 20 mg by mouth daily.  1  . Vitamin D, Ergocalciferol, (DRISDOL) 50000 units CAPS capsule Take 50,000 Units by mouth once a week.  5  . warfarin (COUMADIN) 7.5 MG tablet Take 1 tablet (7.5 mg total) by mouth one time only at 6 PM. 30 tablet 0  . carvedilol (COREG) 25 MG tablet Take 1 tablet (25 mg total) by mouth 2 (two) times daily. 180 tablet 3   No facility-administered medications prior to visit.      Allergies:   Hydralazine hcl; Amlodipine besylate; Darvon [propoxyphene hcl]; Nyquil multi-symptom [pseudoeph-doxylamine-dm-apap]; and Metformin and related   Past Medical History:  Diagnosis  Date  . Acute CVA (cerebrovascular accident) ([place]) 09/16/2014  . Acute DVT of left tibial vein ([place]) 04/03/2017  . Adenocarcinoma ([place]) 01/12/2017  . 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 Anxiety   . Arthritis    knees  . Black tarry stools    05-14-16 negative for occult blood with ER visit- noted in [place].  . Cancer [facility])    thyroid cancer- surgery and radiation  . Cerebral infarction due to unspecified mechanism   . Chronic kidney disease    questionable mass on kidney. Being followed by Dr Diona Fanti  . Complication of anesthesia    heart rate was really low  . CVA (cerebral infarction) 09/11/2014  . Depression   . Diabetes mellitus    type 2  . Diabetes mellitus without complication ([place]) 09/17/3084   Qualifier: Diagnosis of  By: Loanne Drilling MD, Jacelyn Pi   . Dyslipidemia 04/20/2007   Qualifier: Diagnosis of  By: Loanne Drilling MD, Jacelyn Pi   . Full dentures   . Gastroparesis 02/06/2017  . GERD  (gastroesophageal reflux disease) 04/03/2017  . History of CVA (cerebral vascular accident) ([place]) 09/11/2014  . Hypertension   . Hypokalemia 08/25/2017  . Hypothyroidism   . Lumbar stenosis with neurogenic claudication 02/10/2016  . Osteoarthritis 09/11/2014  . Pneumonia   . Right renal mass 09/13/2014  . Spinal stenosis   . Stroke ([place]) 09/2014   left sided weakness  . SVT (supraventricular tachycardia) ([place]) 07/30/2017    Past Surgical History:  Procedure Laterality Date  . ABDOMINAL HYSTERECTOMY     partial  . cataracts     Removed  11/2015  bilateral  . COLONOSCOPY W/ POLYPECTOMY    . ERCP N/A 05/07/2016   Procedure: ENDOSCOPIC RETROGRADE CHOLANGIOPANCREATOGRAPHY (ERCP);  Surgeon: Clarene Essex, MD;  Location: [facility] ENDOSCOPY;  Service: Endoscopy;  Laterality: N/A;  . ERCP N/A 10/16/2016   Procedure: ENDOSCOPIC RETROGRADE CHOLANGIOPANCREATOGRAPHY (ERCP);  Surgeon: Clarene Essex, MD;  Location[facility] ENDOSCOPY;  Service: Endoscopy;  Laterality: N/A;  . ESOPHAGOGASTRODUODENOSCOPY N/A 02/10/2017   Procedure: ESOPHAGOGASTRODUODENOSCOPY (EGD);  Surgeon: Teena Irani, MD;  Location: [facility] ENDOSCOPY;  Service: Endoscopy;  Laterality: N/A;  . EUS N/A 05/27/2016   Procedure: ESOPHAGEAL ENDOSCOPIC ULTRASOUND (EUS) RADIAL;  Surgeon: Arta Silence, MD;  Location: [facility] ENDOSCOPY;  Service: Endoscopy;  Laterality: N/A;  . FLEXIBLE SIGMOIDOSCOPY  03/29/2012   Procedure: FLEXIBLE SIGMOIDOSCOPY;  Surgeon: Jeryl Columbia, MD;  Location: [facility] ENDOSCOPY;  Service: Endoscopy;  Laterality: N/A;  fleet enema upon arrival  . HOT HEMOSTASIS  03/29/2012   Procedure: HOT HEMOSTASIS (ARGON PLASMA COAGULATION/BICAP);  Surgeon: Jeryl Columbia, MD;  Location: [facility] ENDOSCOPY;  Service: Endoscopy;  Laterality: N/A;  . IR GASTR TUBE CONVERT GASTR-JEJ PER W/FL MOD SED  02/23/2017  . IR GASTROSTOMY TUBE MOD SED  02/16/2017  . IR GENERIC HISTORICAL  06/30/2016   IR RADIOLOGIST EVAL & MGMT 06/30/2016 Aletta Edouard, MD [facility] INTERV RAD  . IR GENERIC  HISTORICAL  09/09/2016   IR RADIOLOGIST EVAL & MGMT 09/09/2016 Aletta Edouard, MD [facility] INTERV RAD  . IR IVC FILTER PLMT / S&I /IMG GUID/MOD SED  03/03/2017  . IR PATIENT EVAL TECH 0-60 MINS  05/12/2017  . IR RADIOLOGIST EVAL & MGMT  11/24/2017  . IR RADIOLOGIST EVAL & MGMT  04/07/2018  . LAPAROSCOPY N/A 01/12/2017   Procedure: LAPAROSCOPY DIAGNOSTIC;  Surgeon: Stark Klein, MD;  Location: [facility];  Service: General;  Laterality: N/A;  . LAPAROTOMY N/A 03/10/2017   Procedure: EXPLORATORY LAPAROTOMY Open jejunostomy tube;  Surgeon: Stark Klein, MD;  Location: [facility];  Service: General;  Laterality: N/A;  . LUMBAR LAMINECTOMY/DECOMPRESSION MICRODISCECTOMY N/A 02/10/2016   Procedure: Lumbar three- four Laminectomy;  Surgeon: Kristeen Miss, MD;  Location: [facility]URO ORS;  Service: Neurosurgery;  Laterality: N/A;  L3-4 Laminectomy  . LUMBAR SPINE SURGERY     1st surgery "ray cage placed"  . THYROIDECTOMY  2005  . TONSILLECTOMY    . WHIPPLE PROCEDURE N/A 01/12/2017   Procedure: WHIPPLE PROCEDURE;  Surgeon: Stark Klein, MD;  Location: [facility];  Service: General;  Laterality: N/A;     Social History:  The patient  reports that she has never smoked. She has never used smokeless tobacco. She reports that she does not drink alcohol or use drugs.   Family History:  The patient's family history includes Heart attack in her father; Heart failure in her mother.    ROS:  Please see the history of present illness. All other systems are reviewed and  Negative to the above problem except as noted.    PHYSICAL EXAM: VS:  BP 122/70   Pulse (!) 52   Ht 5\' 4"  (1.626 m)   Wt 199 lb 12.8 oz (90.6 kg)   SpO2 98%   BMI 34.30 kg/m   Gen   Pt is an obese  82 yo  in no acute distress  HEENT: normal  Neck:  No JVD   No  carotid bruits, or masses Cardiac: RRR; no murmurs, rubs, or gallops  1+ LE  edema   (soft) Respiratory:  clear to auscultation bilaterally, normal work of breathing GI: soft, nontender, nondistended,  + BS  No hepatomegaly  MS: no deformity Moving all extremities   Skin: warm and dry, no rash Neuro:  Strength and sensation are intact Psych: euthymic mood, full affect   EKG:  EKG is not ordered today.   Lipid Panel    Component Value Date/Time   CHOL 120 04/09/2017   TRIG 137 04/09/2017   HDL 35 04/09/2017   CHOLHDL 3.0 09/13/2014 0910   VLDL 26 09/13/2014 0910   LDLCALC 58 04/09/2017      Wt Readings from Last 3 Encounters:  04/29/18 199 lb 12.8 oz (90.6 kg)  04/25/18 200 lb 1.6 oz (90.8 kg)  04/07/18 200 lb (90.7 kg)      ASSESSMENT AND PLAN: \ 1  LE edema/  Hx DVT    Persists but not as bad as previous visitls   Continue meds  Check BMET   2  Thyroid  Will recheck TSH    3  SVT  No recurrence   4  HTN  BP is wll controlled     5.  HL Continue Crestor      Tentative f/u in 6   months    Signed, Dorris Carnes, MD  04/29/2018 1:35 PM    [facility] [place], [place], [place]  [address] Phone: [phone]; Fax: [phone]

## 2018-04-30 LAB — COMPREHENSIVE METABOLIC PANEL
ALT: 56 IU/L — ABNORMAL HIGH (ref 0–32)
AST: 59 IU/L — ABNORMAL HIGH (ref 0–40)
Albumin/Globulin Ratio: 1.4 (ref 1.2–2.2)
Albumin: 4 g/dL (ref 3.5–4.8)
Alkaline Phosphatase: 113 IU/L (ref 39–117)
BUN/Creatinine Ratio: 21 (ref 12–28)
BUN: 24 mg/dL (ref 8–27)
Bilirubin Total: 1 mg/dL (ref 0.0–1.2)
CO2: 24 mmol/L (ref 20–29)
Calcium: 8.9 mg/dL (ref 8.7–10.3)
Chloride: 99 mmol/L (ref 96–106)
Creatinine, Ser: 1.13 mg/dL — ABNORMAL HIGH (ref 0.57–1.00)
GFR calc Af Amer: 53 mL/min/{1.73_m2} — ABNORMAL LOW (ref >59)
GFR calc non Af Amer: 46 mL/min/{1.73_m2} — ABNORMAL LOW (ref >59)
Globulin, Total: 2.9 g/dL (ref 1.5–4.5)
Glucose: 217 mg/dL — ABNORMAL HIGH (ref 65–99)
Potassium: 4.2 mmol/L (ref 3.5–5.2)
Sodium: 137 mmol/L (ref 134–144)
Total Protein: 6.9 g/dL (ref 6.0–8.5)

## 2018-04-30 LAB — CBC
Hematocrit: 37.1 % (ref 34.0–46.6)
Hemoglobin: 12.2 g/dL (ref 11.1–15.9)
MCH: 31.7 pg (ref 26.6–33.0)
MCHC: 32.9 g/dL (ref 31.5–35.7)
MCV: 96 fL (ref 79–97)
Platelets: 217 10*3/uL (ref 150–450)
RBC: 3.85 x10E6/uL (ref 3.77–5.28)
RDW: 13.6 % (ref 12.3–15.4)
WBC: 6.6 10*3/uL (ref 3.4–10.8)

## 2018-04-30 LAB — TSH: TSH: 36.34 u[IU]/mL — ABNORMAL HIGH (ref 0.450–4.500)

## 2018-04-30 LAB — PRO B NATRIURETIC PEPTIDE: NT-Pro BNP: 516 pg/mL (ref 0–738)

## 2018-05-04 ENCOUNTER — Telehealth: Payer: Self-pay

## 2018-05-04 DIAGNOSIS — K7689 Other specified diseases of liver: Secondary | ICD-10-CM

## 2018-05-04 NOTE — Telephone Encounter (Signed)
Notes recorded by Frederik Schmidt, RN on 05/04/2018 at 1:14 PM EDT Reviewed lab results/recommendations with patient. I reviewed her synthroid medication and she has not taken it on a regular basis. She will work on that. I set her up for Liver labs 10/18. ------

## 2018-05-04 NOTE — Telephone Encounter (Signed)
-----   Message from Fay Records, MD sent at 05/04/2018 10:52 AM EDT ----- CBC is normal Electrolytes OK  Kidney function OK Liver func are mildly abnormal    Thyroid is way off   Is she taking her synthroid daily, correctly Forward to Dr Jackson Latino   Thyroid may explain some of swelling in feet  F/U liver panel in 4 weeks

## 2018-05-12 ENCOUNTER — Other Ambulatory Visit: Payer: Self-pay

## 2018-05-12 ENCOUNTER — Encounter: Payer: Self-pay | Admitting: Physical Therapy

## 2018-05-12 ENCOUNTER — Ambulatory Visit: Payer: Medicare Other | Attending: General Surgery | Admitting: Physical Therapy

## 2018-05-12 DIAGNOSIS — M6281 Muscle weakness (generalized): Secondary | ICD-10-CM | POA: Diagnosis present

## 2018-05-12 DIAGNOSIS — M62838 Other muscle spasm: Secondary | ICD-10-CM | POA: Diagnosis present

## 2018-05-12 NOTE — Patient Instructions (Signed)
About Abdominal Massage  Abdominal massage, also called external colon massage, is a self-treatment circular massage technique that can reduce and eliminate gas and ease constipation. The colon naturally contracts in waves in a clockwise direction starting from inside the right hip, moving up toward the ribs, across the belly, and down inside the left hip.  When you perform circular abdominal massage, you help stimulate your colon's normal wave pattern of movement called peristalsis.  It is most beneficial when done after eating.  Positioning You can practice abdominal massage with oil while lying down, or in the shower with soap.  Some people find that it is just as effective to do the massage through clothing while sitting or standing.  How to Massage Start by placing your finger tips or knuckles on your right side, just inside your hip bone.  . Make small circular movements while you move upward toward your rib cage.   . Once you reach the bottom right side of your rib cage, take your circular movements across to the left side of the bottom of your rib cage.  . Next, move downward until you reach the inside of your left hip bone.  This is the path your feces travel in your colon. . Continue to perform your abdominal massage in this pattern for 10 minutes each day.     You can apply as much pressure as is comfortable in your massage.  Start gently and build pressure as you continue to practice.  Notice any areas of pain as you massage; areas of slight pain may be relieved as you massage, but if you have areas of significant or intense pain, consult with your healthcare provider.  Other Considerations . General physical activity including bending and stretching can have a beneficial massage-like effect on the colon.  Deep breathing can also stimulate the colon because breathing deeply activates the same nervous system that supplies the colon.   . Abdominal massage should always be used in  combination with a bowel-conscious diet that is high in the proper type of fiber for you, fluids (primarily water), and a regular exercise program .  Fall Prevention and Home Safety Falls cause injuries and can affect all age groups. It is possible to use preventive measures to significantly decrease the likelihood of falls. There are many simple measures which can make your home safer and prevent falls. OUTDOORS  Repair cracks and edges of walkways and driveways.  Remove high doorway thresholds.  Trim shrubbery on the main path into your home.  Have good outside lighting.  Clear walkways of tools, rocks, debris, and clutter.  Check that handrails are not broken and are securely fastened. Both sides of steps should have handrails.  Have leaves, snow, and ice cleared regularly.  Use sand or salt on walkways during winter months.  In the garage, clean up grease or oil spills. BATHROOM  Install night lights.  Install grab bars by the toilet and in the tub and shower.  Use non-skid mats or decals in the tub or shower.  Place a plastic non-slip stool in the shower to sit on, if needed.  Keep floors dry and clean up all water on the floor immediately.  Remove soap buildup in the tub or shower on a regular basis.  Secure bath mats with non-slip, double-sided rug tape.  Remove throw rugs and tripping hazards from the floors. BEDROOMS  Install night lights.  Make sure a bedside light is easy to reach.  Do not use  oversized bedding.  Keep a telephone by your bedside.  Have a firm chair with side arms to use for getting dressed.  Remove throw rugs and tripping hazards from the floor. KITCHEN  Keep handles on pots and pans turned toward the center of the stove. Use back burners when possible.  Clean up spills quickly and allow time for drying.  Avoid walking on wet floors.  Avoid hot utensils and knives.  Position shelves so they are not too high or low.  Place  commonly used objects within easy reach.  If necessary, use a sturdy step stool with a grab bar when reaching.  Keep electrical cables out of the way.  Do not use floor polish or wax that makes floors slippery. If you must use wax, use non-skid floor wax.  Remove throw rugs and tripping hazards from the floor. STAIRWAYS  Never leave objects on stairs.  Place handrails on both sides of stairways and use them. Fix any loose handrails. Make sure handrails on both sides of the stairways are as long as the stairs.  Check carpeting to make sure it is firmly attached along stairs. Make repairs to worn or loose carpet promptly.  Avoid placing throw rugs at the top or bottom of stairways, or properly secure the rug with carpet tape to prevent slippage. Get rid of throw rugs, if possible.  Have an electrician put in a light switch at the top and bottom of the stairs. OTHER FALL PREVENTION TIPS  Wear low-heel or rubber-soled shoes that are supportive and fit well. Wear closed toe shoes.  When using a stepladder, make sure it is fully opened and both spreaders are firmly locked. Do not climb a closed stepladder.  Add color or contrast paint or tape to grab bars and handrails in your home. Place contrasting color strips on first and last steps.  Learn and use mobility aids as needed. Install an electrical emergency response system.  Turn on lights to avoid dark areas. Replace light bulbs that burn out immediately. Get light switches that glow.  Arrange furniture to create clear pathways. Keep furniture in the same place.  Firmly attach carpet with non-skid or double-sided tape.  Eliminate uneven floor surfaces.  Select a carpet pattern that does not visually hide the edge of steps.  Be aware of all pets. OTHER HOME SAFETY TIPS  Set the water temperature for 120 F (48.8 C).  Keep emergency numbers on or near the telephone.  Keep smoke detectors on every level of the home and near  sleeping areas. Document Released: 07/24/2002 Document Revised: 02/02/2012 Document Reviewed: 10/23/2011 [facility] Patient Information 2015 [place], [place]. This information is not intended to replace advice given to you by your health care provider. Make sure you discuss any questions you have with your health care provider. [facility] [address], [place], [place] [address] Phone # [phone] Fax [phone]

## 2018-05-12 NOTE — Therapy (Addendum)
[facility] [address], [place], [place], [address] Phone: [phone]   Fax:  [phone]  Physical Therapy Evaluation  Patient Details  Name: Kaylee Mullins MRN: [id number] Date of Birth: 07-Oct-1935 Referring Provider (PT): Dr. Stark Klein   Encounter Date: 05/12/2018  PT End of Session - 05/12/18 1313    Visit Number  1    Date for PT Re-Evaluation  07/07/18    Authorization Type  [employer] Medicare    PT Start Time  1230    PT Stop Time  1300    PT Time Calculation (min)  30 min    Activity Tolerance  Patient tolerated treatment well    Behavior During Therapy  [facility] for tasks assessed/performed       Past Medical History:  Diagnosis Date  . Acute CVA (cerebrovascular accident) ([place]) 09/16/2014  . Acute DVT of left tibial vein ([place]) 04/03/2017  . Adenocarcinoma ([place]) 01/12/2017  . 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 Anxiety   . Arthritis    knees  . Black tarry stools    05-14-16 negative for occult blood with ER visit- noted in [place].  . Cancer [facility])    thyroid cancer- surgery and radiation  . Cerebral infarction due to unspecified mechanism   . Chronic kidney disease    questionable mass on kidney. Being followed by Dr Diona Fanti  . Complication of anesthesia    heart rate was really low  . CVA (cerebral infarction) 09/11/2014  . Depression   . Diabetes mellitus    type 2  . Diabetes mellitus without complication ([place]) 08/25/3788   Qualifier: Diagnosis of  By: Loanne Drilling MD, Jacelyn Pi   . Dyslipidemia 04/20/2007   Qualifier: Diagnosis of  By: Loanne Drilling MD, Jacelyn Pi   . Full dentures   . Gastroparesis 02/06/2017  . GERD (gastroesophageal reflux disease) 04/03/2017  . History of CVA (cerebral vascular accident) ([place]) 09/11/2014  . Hypertension   . Hypokalemia 08/25/2017  . Hypothyroidism   . Lumbar stenosis with neurogenic claudication 02/10/2016  . Osteoarthritis 09/11/2014  . Pneumonia   . Right renal mass 09/13/2014   . Spinal stenosis   . Stroke ([place]) 09/2014   left sided weakness  . SVT (supraventricular tachycardia) ([place]) 07/30/2017    Past Surgical History:  Procedure Laterality Date  . ABDOMINAL HYSTERECTOMY     partial  . cataracts     Removed  11/2015  bilateral  . COLONOSCOPY W/ POLYPECTOMY    . ERCP N/A 05/07/2016   Procedure: ENDOSCOPIC RETROGRADE CHOLANGIOPANCREATOGRAPHY (ERCP);  Surgeon: Clarene Essex, MD;  Location: [facility] ENDOSCOPY;  Service: Endoscopy;  Laterality: N/A;  . ERCP N/A 10/16/2016   Procedure: ENDOSCOPIC RETROGRADE CHOLANGIOPANCREATOGRAPHY (ERCP);  Surgeon: Clarene Essex, MD;  Location: [facility] ENDOSCOPY;  Service: Endoscopy;  Laterality: N/A;  . ESOPHAGOGASTRODUODENOSCOPY N/A 02/10/2017   Procedure: ESOPHAGOGASTRODUODENOSCOPY (EGD);  Surgeon: Teena Irani, MD;  Location: [facility] ENDOSCOPY;  Service: Endoscopy;  Laterality: N/A;  . EUS N/A 05/27/2016   Procedure: ESOPHAGEAL ENDOSCOPIC ULTRASOUND (EUS) RADIAL;  Surgeon: Arta Silence, MD;  Location: [facility] ENDOSCOPY;  Service: Endoscopy;  Laterality: N/A;  . FLEXIBLE SIGMOIDOSCOPY  03/29/2012   Procedure: FLEXIBLE SIGMOIDOSCOPY;  Surgeon: Jeryl Columbia, MD;  Location: [facility] ENDOSCOPY;  Service: Endoscopy;  Laterality: N/A;  fleet enema upon arrival  . HOT HEMOSTASIS  03/29/2012   Procedure: HOT HEMOSTASIS (ARGON PLASMA COAGULATION/BICAP);  Surgeon: Jeryl Columbia, MD;  Location: [facility] ENDOSCOPY;  Service: Endoscopy;  Laterality: N/A;  . IR  GASTR TUBE CONVERT GASTR-JEJ PER W/FL MOD SED  02/23/2017  . IR GASTROSTOMY TUBE MOD SED  02/16/2017  . IR GENERIC HISTORICAL  06/30/2016   IR RADIOLOGIST EVAL & MGMT 06/30/2016 Aletta Edouard, MD [facility] INTERV RAD  . IR GENERIC HISTORICAL  09/09/2016   IR RADIOLOGIST EVAL & MGMT 09/09/2016 Aletta Edouard, MD [facility] INTERV RAD  . IR IVC FILTER PLMT / S&I /IMG GUID/MOD SED  03/03/2017  . IR PATIENT EVAL TECH 0-60 MINS  05/12/2017  . IR RADIOLOGIST EVAL & MGMT  11/24/2017  . IR RADIOLOGIST EVAL & MGMT  04/07/2018  . LAPAROSCOPY N/A  01/12/2017   Procedure: LAPAROSCOPY DIAGNOSTIC;  Surgeon: Stark Klein, MD;  Location: [facility];  Service: General;  Laterality: N/A;  . LAPAROTOMY N/A 03/10/2017   Procedure: EXPLORATORY LAPAROTOMY Open jejunostomy tube;  Surgeon: Stark Klein, MD;  Location: [facility];  Service: General;  Laterality: N/A;  . LUMBAR LAMINECTOMY/DECOMPRESSION MICRODISCECTOMY N/A 02/10/2016   Procedure: Lumbar three- four Laminectomy;  Surgeon: Kristeen Miss, MD;  Location: [facility] NEURO ORS;  Service: Neurosurgery;  Laterality: N/A;  L3-4 Laminectomy  . LUMBAR SPINE SURGERY     1st surgery "ray cage placed"  . THYROIDECTOMY  2005  . TONSILLECTOMY    . WHIPPLE PROCEDURE N/A 01/12/2017   Procedure: WHIPPLE PROCEDURE;  Surgeon: Stark Klein, MD;  Location: [facility];  Service: General;  Laterality: N/A;    There were no vitals filed for this visit.   Subjective Assessment - 05/12/18 1233    Subjective  They found lymph nodes in my left kidney that are cancerous.  Abdominal pain from the Whipple surgery. I have a bowel movement every day around 12:30.  I have pain in left back.  I had a stroke  so my left leg is weak.     Patient Stated Goals  be able to walk, better balance, reduce abdominal pain    Currently in Pain?  Yes    Pain Score  6     Pain Location  Abdomen    Pain Orientation  Anterior    Pain Descriptors / Indicators  Cramping    Pain Type  Chronic pain    Pain Onset  More than a month ago    Pain Frequency  Constant    Aggravating Factors   stand longer than 12-15 minutes    Pain Relieving Factors  lay in a fetal position;     Multiple Pain Sites  Yes    Pain Score  8    Pain Location  Back    Pain Orientation  Left    Pain Descriptors / Indicators  Grimacing    Pain Type  Acute pain    Pain Radiating Towards  goes into buttocks    Pain Onset  More than a month ago    Pain Frequency  Intermittent    Aggravating Factors   standing more than 12 minutes; walking    Pain Relieving Factors  rest, lean on  something when standing; sitting, laying down         [facility] PT Assessment - 05/12/18 0001      Assessment   Medical Diagnosis  R10.9 Abdominal Wall pain; M79.18 Gluteal Pain    Referring Provider (PT)  Dr. Stark Klein    Onset Date/Surgical Date  01/12/17    Prior Therapy  None      Precautions   Precautions  Other (comment)    Precaution Comments  cancer  Restrictions   Weight Bearing Restrictions  No      Balance Screen   Has the patient fallen in the past 6 months  No    Has the patient had a decrease in activity level because of a fear of falling?   No    Is the patient reluctant to leave their home because of a fear of falling?   No      Home Environment   Living Environment  Private residence    Additional Comments  has  a bed that moves up and down, chair that goes up the stairs      Prior Function   Level of Independence  Independent with basic ADLs    Vocation  Retired      Observation/Other Assessments   Skin Integrity  decreased mobility of scar on midline of abdomen and lower abdomen      ROM / Strength   AROM / PROM / Strength  AROM;PROM;Strength      Strength   Overall Strength Comments  abdominal strength 2/5    Right Hip Flexion  4/5    Left Hip Flexion  3+/5    Right Knee Flexion  4/5    Right Knee Extension  4/5    Left Knee Flexion  4/5    Left Knee Extension  4/5      Palpation   Palpation comment  tenderness along the left upper and lower abdomen      Ambulation/Gait   Ambulation/Gait  Yes    Assistive device  Straight cane    Gait Pattern  Decreased arm swing - left;Decreased stride length    Stairs  Yes    Stairs Assistance  Not tested (comment)      Standardized Balance Assessment   Standardized Balance Assessment  Timed Up and Go Test;Five Times Sit to Stand    Five times sit to stand comments   25 sec      Timed Up and Go Test   TUG  Normal TUG    Normal TUG (seconds)  18   no assistive device                Objective measurements completed on examination: See above findings.    Pelvic Floor Special Questions - 05/12/18 0001    Urinary Leakage  No    Fecal incontinence  No               PT Education - 05/12/18 1312    Education Details  information on abdominal massage and tips to avoid falls    Person(s) Educated  Patient    Methods  Explanation;Demonstration;Verbal cues;Handout    Comprehension  Verbalized understanding;Returned demonstration       PT Short Term Goals - 05/12/18 1356      PT SHORT TERM GOAL #1   Title  be independent in initial HEP for strength and endurance    Time  4    Period  Weeks    Status  New    Target Date  06/09/18      PT SHORT TERM GOAL #2   Title  abdominal pain decreased >/= 25% with standing for 15 minutes due to improved scar mobility    Time  4    Period  Weeks    Status  New    Target Date  06/09/18      PT SHORT TERM GOAL #3   Title  perfrom 5x sit to stand in <  or = to 20 seconds    Time  4    Period  Weeks    Status  New    Target Date  06/09/18      PT SHORT TERM GOAL #4   Title  TUG score </= 15 secondes due to improve lower extremity strength and balance    Time  4    Period  Weeks    Status  New    Target Date  06/09/18        PT Long Term Goals - 05/12/18 1359      PT LONG TERM GOAL #1   Title  be independent in advanced HEP    Time  8    Period  Weeks    Status  New    Target Date  07/07/18      PT LONG TERM GOAL #2   Title  Patient will verbalize fall prevention techiques for the home and community    Time  8    Period  Weeks    Status  New    Target Date  07/07/18      PT LONG TERM GOAL #3   Title  improve LE strength to perform sit to stand 5 times without UE support </= 14 seconds due to improved strength and balance    Time  8    Period  Weeks    Status  New    Target Date  07/07/18      PT LONG TERM GOAL #4   Title  demonstrate decreased fall risk with TUG in < or  13 seconds    Baseline  --    Time  8    Period  Weeks    Status  New    Target Date  07/07/18      PT LONG TERM GOAL #5   Title  abdominal pain with standing >/= 15 minutes decreased >/= 75% due to elongation of scar tissue    Time  8    Period  Weeks    Status  New    Target Date  07/07/18             Plan - 05/12/18 1314    Clinical Impression Statement  Patient is a 82 year old female with abdominal pain and deconditioned since 01/12/2017.  Patient reports her abdominal and back pain is 8/10 with standing and walking.  Patient has tenderness located  in left upper abdominal and lower. Patient has decreased mobiliy of surgical sites on the abdomen.  Abdominal strength is 2/5.  Patient has weakness throughout her hips. Sit to stand 5 times is 25 seconds indicating risk of falls.  TUG score is 18 seconds indicating a risk of falls.  Patient is not able to stand more than 12 minutes due to pain.  Patient feels best in her bed or chair. Patient will benefit from skilled therapy to reduce her abdominal and back pain so she is able to exercise to improve her endurance.     History and Personal Factors relevant to plan of care:  Whippe procedure 01/12/2018; Acute CVA 09/16/2014; DVT of left tibia vein 04/03/2017; Adenocarinoma 01/12/2017; Thyroid cancer; Diabetes; Lumbar and cervical surgery    Clinical Presentation  Evolving    Clinical Presentation due to:  patient has to perform her household chores very slowly due to increased pain and reduction of endurance    Clinical Decision Making  Moderate    Rehab Potential  Good  Clinical Impairments Affecting Rehab Potential  Whippe procedure 01/12/2018; Acute CVA 09/16/2014; DVT of left tibia vein 04/03/2017; Adenocarinoma 01/12/2017; Thyroid cancer; Diabetes; Lumbar and cervical surgery    PT Frequency  2x / week    PT Duration  8 weeks    PT Treatment/Interventions  Cryotherapy;Electrical Stimulation;Moist Heat;Therapeutic activities;Therapeutic  exercise;Balance training;Patient/family education;Neuromuscular re-education;Manual techniques;Scar mobilization    PT Next Visit Plan  scar tissue mobilization to abdomen; hip and abdominal strength; balance exercises; ambulation    Consulted and Agree with Plan of Care  Patient       Patient will benefit from skilled therapeutic intervention in order to improve the following deficits and impairments:  Abnormal gait, Pain, Difficulty walking, Decreased activity tolerance, Decreased endurance, Decreased strength, Increased muscle spasms, Decreased scar mobility, Decreased coordination  Visit Diagnosis: Muscle weakness (generalized) - Plan: PT plan of care cert/re-cert  Other muscle spasm - Plan: PT plan of care cert/re-cert     Problem List Patient Active Problem List   Diagnosis Date Noted  . Hypokalemia 08/25/2017  . Long term (current) use of anticoagulants [Z79.01] 08/12/2017  . Spinal stenosis   . Pneumonia   . Full dentures   . Depression   . Complication of anesthesia   . Chronic kidney disease   . Cancer ([place])   . Black tarry stools   . Arthritis   . Anxiety   . Hypertension 08/03/2017  . Paroxysmal SVT (supraventricular tachycardia) ([place]) 08/03/2017  . History of CVA (cerebrovascular accident) 08/03/2017  . Hypothyroidism, adult 08/03/2017  . Chronic diastolic heart failure ([place]) 08/03/2017  . History of cancer of ampulla of duodenum 08/03/2017  . Left flank pain 08/03/2017  . Diabetes mellitus type 2, uncontrolled ([place])   . CKD stage 3 due to type 2 diabetes mellitus ([place])   . SVT (supraventricular tachycardia) ([place]) 07/30/2017  . Edema extremities 07/30/2017  . DVT, lower extremity, recurrent, bilateral ([place]) 07/30/2017  . On total parenteral nutrition (TPN) 04/08/2017  . Acute DVT of left tibial vein ([place]) 04/03/2017  . Hematoma of rectus sheath 04/03/2017  . GERD (gastroesophageal reflux disease) 04/03/2017  . Malnutrition of moderate degree 02/07/2017  .  Gastroparesis 02/06/2017  . Adenocarcinoma ([place]) 01/12/2017  . 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 Essential hypertension 02/11/2016  . Lumbar stenosis with neurogenic claudication 02/10/2016  . Hyperlipidemia 02/10/2016  . Hypothyroidism 02/10/2016  . Musculoskeletal neck pain 12/13/2014  . Muscular pain 12/13/2014  . Stroke ([place]) 09/17/2014  . Numbness   . Acute CVA (cerebrovascular accident) ([place]) 09/16/2014  . Right renal mass 09/13/2014  . Cerebral infarction due to unspecified mechanism   . CVA (cerebral infarction) 09/11/2014  . Osteoarthritis 09/11/2014  . Postsurgical hypothyroidism 09/11/2014  . History of CVA (cerebral vascular accident) ([place]) 09/11/2014  . Diabetes mellitus without complication ([place]) 74/07/8785  . Dyslipidemia 04/20/2007    Earlie Counts, PT 05/12/18 2:02 PM   [facility] [address] [place], [place], [address] Phone: [phone]   Fax:  [phone]  Name: Kaylee Mullins MRN: [id number] Date of Birth: 1935/10/31 PHYSICAL THERAPY DISCHARGE SUMMARY  Visits from Start of Care: 1  Current functional level related to goals / functional outcomes: See above. Patient called to cancel the rest of her appointments due to no transportation.    Remaining deficits: See above. Only had the initial evaluation.    Education / Equipment: HEP Plan: Patient agrees to discharge.  Patient goals were not met. Patient is being discharged  due to the patient's request. No transportation for therapy. Thank you for the referral. Earlie Counts, PT 05/24/18 12:24 PM   ?????

## 2018-05-16 ENCOUNTER — Ambulatory Visit: Payer: Medicare Other | Admitting: Physical Therapy

## 2018-05-24 ENCOUNTER — Ambulatory Visit: Payer: Medicare Other | Admitting: Physical Therapy

## 2018-05-26 ENCOUNTER — Ambulatory Visit: Payer: Medicare Other | Admitting: Physical Therapy

## 2018-05-31 ENCOUNTER — Encounter: Payer: Medicare Other | Admitting: Physical Therapy

## 2018-06-02 ENCOUNTER — Ambulatory Visit: Payer: Medicare Other | Admitting: Physical Therapy

## 2018-06-03 ENCOUNTER — Other Ambulatory Visit: Payer: Medicare Other | Admitting: *Deleted

## 2018-06-03 DIAGNOSIS — K7689 Other specified diseases of liver: Secondary | ICD-10-CM

## 2018-06-03 LAB — HEPATIC FUNCTION PANEL
ALT: 42 IU/L — ABNORMAL HIGH (ref 0–32)
AST: 43 IU/L — ABNORMAL HIGH (ref 0–40)
Albumin: 3.8 g/dL (ref 3.5–4.8)
Alkaline Phosphatase: 136 IU/L — ABNORMAL HIGH (ref 39–117)
Bilirubin Total: 0.5 mg/dL (ref 0.0–1.2)
Bilirubin, Direct: 0.24 mg/dL (ref 0.00–0.40)
Total Protein: 6.7 g/dL (ref 6.0–8.5)

## 2018-06-07 ENCOUNTER — Encounter: Payer: Medicare Other | Admitting: Physical Therapy

## 2018-06-09 ENCOUNTER — Encounter: Payer: Medicare Other | Admitting: Physical Therapy

## 2018-06-10 ENCOUNTER — Ambulatory Visit (INDEPENDENT_AMBULATORY_CARE_PROVIDER_SITE_OTHER): Payer: Medicare Other | Admitting: *Deleted

## 2018-06-10 DIAGNOSIS — Z7901 Long term (current) use of anticoagulants: Secondary | ICD-10-CM

## 2018-06-10 DIAGNOSIS — I82442 Acute embolism and thrombosis of left tibial vein: Secondary | ICD-10-CM | POA: Diagnosis not present

## 2018-06-10 LAB — POCT INR: INR: 1.7 — AB (ref 2.0–3.0)

## 2018-06-10 NOTE — Patient Instructions (Addendum)
Description   Today take 1.5 tablets then continue on same dosage 1 tablet daily.  Repeat INR in 3 weeks. Call us with any medication changes or concerns to [facility] # [phone], Main # [phone].

## 2018-06-14 ENCOUNTER — Encounter: Payer: Medicare Other | Admitting: Physical Therapy

## 2018-06-16 ENCOUNTER — Encounter: Payer: Medicare Other | Admitting: Physical Therapy

## 2018-06-21 ENCOUNTER — Encounter: Payer: Medicare Other | Admitting: Physical Therapy

## 2018-06-22 ENCOUNTER — Inpatient Hospital Stay: Payer: Medicare Other | Attending: Oncology

## 2018-06-22 ENCOUNTER — Ambulatory Visit (HOSPITAL_COMMUNITY): Payer: Medicare Other

## 2018-06-22 DIAGNOSIS — Z23 Encounter for immunization: Secondary | ICD-10-CM | POA: Insufficient documentation

## 2018-06-22 DIAGNOSIS — C241 Malignant neoplasm of ampulla of Vater: Secondary | ICD-10-CM | POA: Insufficient documentation

## 2018-06-22 DIAGNOSIS — E119 Type 2 diabetes mellitus without complications: Secondary | ICD-10-CM | POA: Insufficient documentation

## 2018-06-22 DIAGNOSIS — I10 Essential (primary) hypertension: Secondary | ICD-10-CM | POA: Insufficient documentation

## 2018-06-22 DIAGNOSIS — Z85528 Personal history of other malignant neoplasm of kidney: Secondary | ICD-10-CM | POA: Insufficient documentation

## 2018-06-23 ENCOUNTER — Encounter: Payer: Medicare Other | Admitting: Physical Therapy

## 2018-06-24 ENCOUNTER — Inpatient Hospital Stay: Payer: Medicare Other | Admitting: Oncology

## 2018-06-27 ENCOUNTER — Other Ambulatory Visit: Payer: Medicare Other

## 2018-06-27 ENCOUNTER — Ambulatory Visit: Payer: Medicare Other | Admitting: Oncology

## 2018-06-30 ENCOUNTER — Encounter (HOSPITAL_COMMUNITY): Payer: Self-pay

## 2018-06-30 ENCOUNTER — Inpatient Hospital Stay: Payer: Medicare Other

## 2018-06-30 ENCOUNTER — Ambulatory Visit (HOSPITAL_COMMUNITY)
Admission: RE | Admit: 2018-06-30 | Discharge: 2018-06-30 | Disposition: A | Discharge status: Home / Self Care | Payer: Medicare Other | Source: Ambulatory Visit | Attending: Nurse Practitioner | Admitting: Nurse Practitioner

## 2018-06-30 DIAGNOSIS — R911 Solitary pulmonary nodule: Secondary | ICD-10-CM | POA: Insufficient documentation

## 2018-06-30 DIAGNOSIS — R599 Enlarged lymph nodes, unspecified: Secondary | ICD-10-CM | POA: Diagnosis not present

## 2018-06-30 DIAGNOSIS — I10 Essential (primary) hypertension: Secondary | ICD-10-CM | POA: Diagnosis not present

## 2018-06-30 DIAGNOSIS — I700 Atherosclerosis of aorta: Secondary | ICD-10-CM | POA: Diagnosis not present

## 2018-06-30 DIAGNOSIS — C241 Malignant neoplasm of ampulla of Vater: Secondary | ICD-10-CM | POA: Insufficient documentation

## 2018-06-30 DIAGNOSIS — Z23 Encounter for immunization: Secondary | ICD-10-CM | POA: Diagnosis not present

## 2018-06-30 DIAGNOSIS — Z85528 Personal history of other malignant neoplasm of kidney: Secondary | ICD-10-CM | POA: Diagnosis not present

## 2018-06-30 DIAGNOSIS — E119 Type 2 diabetes mellitus without complications: Secondary | ICD-10-CM | POA: Diagnosis not present

## 2018-06-30 LAB — CMP (CANCER CENTER ONLY)
ALT: 60 U/L — ABNORMAL HIGH (ref 0–44)
AST: 57 U/L — ABNORMAL HIGH (ref 15–41)
Albumin: 3.5 g/dL (ref 3.5–5.0)
Alkaline Phosphatase: 130 U/L — ABNORMAL HIGH (ref 38–126)
Anion gap: 9 (ref 5–15)
BUN: 16 mg/dL (ref 8–23)
CO2: 25 mmol/L (ref 22–32)
Calcium: 8.7 mg/dL — ABNORMAL LOW (ref 8.9–10.3)
Chloride: 105 mmol/L (ref 98–111)
Creatinine: 1.27 mg/dL — ABNORMAL HIGH (ref 0.44–1.00)
GFR, Est AFR Am: 45 mL/min — ABNORMAL LOW (ref >60)
GFR, Estimated: 39 mL/min — ABNORMAL LOW (ref >60)
Glucose, Bld: 229 mg/dL — ABNORMAL HIGH (ref 70–99)
Potassium: 3.6 mmol/L (ref 3.5–5.1)
Sodium: 139 mmol/L (ref 135–145)
Total Bilirubin: 0.9 mg/dL (ref 0.3–1.2)
Total Protein: 7.3 g/dL (ref 6.5–8.1)

## 2018-06-30 MED ORDER — IOHEXOL 300 MG/ML  SOLN
75.0000 mL | Freq: Once | INTRAMUSCULAR | Status: AC | PRN
  Administered 2018-06-30: 75 mL via INTRAVENOUS

## 2018-06-30 MED ORDER — SODIUM CHLORIDE (PF) 0.9 % IJ SOLN
INTRAMUSCULAR | Status: AC
  Filled 2018-06-30: qty 50

## 2018-07-01 ENCOUNTER — Ambulatory Visit (INDEPENDENT_AMBULATORY_CARE_PROVIDER_SITE_OTHER): Payer: Medicare Other | Admitting: *Deleted

## 2018-07-01 DIAGNOSIS — I82442 Acute embolism and thrombosis of left tibial vein: Secondary | ICD-10-CM | POA: Diagnosis not present

## 2018-07-01 DIAGNOSIS — Z7901 Long term (current) use of anticoagulants: Secondary | ICD-10-CM | POA: Diagnosis not present

## 2018-07-01 LAB — POCT INR: INR: 2.5 (ref 2.0–3.0)

## 2018-07-01 LAB — CANCER ANTIGEN 19-9: CA 19-9: 303 U/mL — ABNORMAL HIGH (ref 0–35)

## 2018-07-01 NOTE — Patient Instructions (Signed)
Description   Continue on same dosage 1 tablet daily.  Repeat INR in 4 weeks. Call us with any medication changes or concerns to [facility] # [phone], Main # [phone].

## 2018-07-07 ENCOUNTER — Inpatient Hospital Stay: Payer: Medicare Other | Admitting: Oncology

## 2018-07-08 ENCOUNTER — Inpatient Hospital Stay (HOSPITAL_BASED_OUTPATIENT_CLINIC_OR_DEPARTMENT_OTHER): Payer: Medicare Other | Admitting: Oncology

## 2018-07-08 ENCOUNTER — Telehealth: Payer: Self-pay | Admitting: Oncology

## 2018-07-08 VITALS — BP 136/63 | HR 59 | Temp 97.8°F | Resp 16 | Ht 64.0 in | Wt 197.3 lb

## 2018-07-08 DIAGNOSIS — I10 Essential (primary) hypertension: Secondary | ICD-10-CM

## 2018-07-08 DIAGNOSIS — Z23 Encounter for immunization: Secondary | ICD-10-CM

## 2018-07-08 DIAGNOSIS — C241 Malignant neoplasm of ampulla of Vater: Secondary | ICD-10-CM | POA: Diagnosis not present

## 2018-07-08 DIAGNOSIS — Z85528 Personal history of other malignant neoplasm of kidney: Secondary | ICD-10-CM

## 2018-07-08 DIAGNOSIS — E119 Type 2 diabetes mellitus without complications: Secondary | ICD-10-CM

## 2018-07-08 MED ORDER — INFLUENZA VAC SPLIT HIGH-DOSE 0.5 ML IM SUSY
0.5000 mL | PREFILLED_SYRINGE | Freq: Once | INTRAMUSCULAR | Status: AC
  Administered 2018-07-08: 0.5 mL via INTRAMUSCULAR
  Filled 2018-07-08: qty 0.5

## 2018-07-08 NOTE — Telephone Encounter (Signed)
Gave pt avs and calendar  °

## 2018-07-08 NOTE — Progress Notes (Signed)
[facility] OFFICE PROGRESS NOTE   Diagnosis: Ampullary carcinoma  INTERVAL HISTORY:   Kaylee Mullins returns after missing a scheduled visit earlier this week.  She reports chronic left lower back pain.  She has intermittent diarrhea.  She has mild left leg weakness since suffering a CVA several years ago.  She reports a good appetite.  Objective:  Vital signs in last 24 hours:  Blood pressure 136/63, pulse (!) 59, temperature 97.8 F (36.6 C), temperature source Oral, resp. rate 16, height 5\' 4"  (1.626 m), weight 197 lb 4.8 oz (89.5 kg), SpO2 100 %.    HEENT: Neck without mass Lymphatics: No cervical, supraclavicular, axillary, or inguinal nodes Resp: Lungs clear bilaterally Cardio: Regular rate and rhythm GI: No hepatosplenomegaly, no mass, nontender Vascular: No leg edema   Lab Results:  Lab Results  Component Value Date   WBC 6.6 04/29/2018   HGB 12.2 04/29/2018   HCT 37.1 04/29/2018   MCV 96 04/29/2018   PLT 217 04/29/2018   NEUTROABS 6.1 03/29/2017    CMP  Lab Results  Component Value Date   NA 139 06/30/2018   K 3.6 06/30/2018   CL 105 06/30/2018   CO2 25 06/30/2018   GLUCOSE 229 (H) 06/30/2018   BUN 16 06/30/2018   CREATININE 1.27 (H) 06/30/2018   CALCIUM 8.7 (L) 06/30/2018   PROT 7.3 06/30/2018   ALBUMIN 3.5 06/30/2018   AST 57 (H) 06/30/2018   ALT 60 (H) 06/30/2018   ALKPHOS 130 (H) 06/30/2018   BILITOT 0.9 06/30/2018   GFRNONAA 39 (L) 06/30/2018   GFRAA 45 (L) 06/30/2018   CA 19-9 on 06/30/2018: 303   Imaging: CT 06/30/2018-images reviewed with Ms. Leedom and her son  Medications: I have reviewed the patient's current medications.   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 CT abdomen/pelvis 10/23/2016-ampullary mass, single mildly enlarged porta hepatis lymph node, no evidence of distant metastatic disease ? Status  post pancreaticoduodenectomy 01/12/2017; pT3pN2 ? CT abdomen/pelvis 03/30/2018- ablation defect within the upper pole of the right kidney.  No evidence of recurrent renal mass.  New mild left periaortic retroperitoneal lymphadenopathy. ? CT abdomen/pelvis 06/30/2018- 6 mm right middle lobe nodule-slowly growing over multiple CTs, stable ablation defect in the right kidney, 1.9 cm left periaortic node increased from 1.2 cm, stable 1.5 cm porta hepatis node 2. Biliary obstruction secondary to #1, status post placement of a metal, bile duct stent on 10/23/2016  3. 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 Renal cell carcinoma-status post ablation of a right renal mass 07/15/2016, biopsy confirmed papillary renal cell carcinoma, Fuhrman grade 3  5. CVA in 2016  6. History of thyroid cancer-status post thyroidectomy and radioactive iodine in 2005  7. Diabetes  8.Hypertension  9.Left lower extremity deep vein thrombosis June 2019, IVC filter placed July 2018 after she was diagnosed with a rectus hematoma while on Lovenox    Disposition: Kaylee Mullins appears unchanged.  He has no symptoms to suggest progression of the ampullary carcinoma.  However the CA 19-9 is higher and the restaging CT reveals an enlarging periaortic node and a suspicious right middle lobe nodule. I discussed the CT findings and treatment options with Kaylee Mullins and her husband.  We decided to follow her with observation.  She will return for an office visit and CA 19-9 in 2 months.  Betsy Coder, MD  07/08/2018  8:59 AM

## 2018-07-29 ENCOUNTER — Ambulatory Visit (INDEPENDENT_AMBULATORY_CARE_PROVIDER_SITE_OTHER): Payer: Medicare Other

## 2018-07-29 DIAGNOSIS — I82442 Acute embolism and thrombosis of left tibial vein: Secondary | ICD-10-CM | POA: Diagnosis not present

## 2018-07-29 DIAGNOSIS — Z7901 Long term (current) use of anticoagulants: Secondary | ICD-10-CM

## 2018-07-29 LAB — POCT INR: INR: 2.7 (ref 2.0–3.0)

## 2018-07-29 NOTE — Patient Instructions (Signed)
Description   Continue on same dosage 1 tablet daily.  Repeat INR in 4 weeks. Primary care is ordering pt a machine and will manage and monitor.  Please call once you have your monitor.  Call us with any medication changes or concerns to [facility]mad[facility] Clinic # [phone], Main # [phone].

## 2018-08-19 LAB — POCT INR: INR: 2.2 (ref 2.0–3.0)

## 2018-08-22 ENCOUNTER — Ambulatory Visit: Payer: Self-pay | Admitting: Cardiovascular Disease

## 2018-08-22 ENCOUNTER — Telehealth: Payer: Self-pay | Admitting: *Deleted

## 2018-08-22 DIAGNOSIS — I82442 Acute embolism and thrombosis of left tibial vein: Secondary | ICD-10-CM

## 2018-08-22 DIAGNOSIS — Z7901 Long term (current) use of anticoagulants: Secondary | ICD-10-CM

## 2018-08-22 NOTE — Telephone Encounter (Signed)
Received a faxed INR from INR monitoring system from the [facility] location. The pt INR's are being managed by her PCP per faxed report. The original was faxed to PCP Dr. Jeanie Cooks per documentation on the paper results and the note on it states that pt to continue same dose and send copy to cardiology. Pt is aware that her PCP manages her INR and that we are unable to manage her self testing results. Pt appointment for 08/26/18 canceled since she is being managed by PCP.

## 2018-08-26 ENCOUNTER — Ambulatory Visit: Payer: Medicare Other | Admitting: Podiatry

## 2018-09-01 ENCOUNTER — Encounter: Payer: Self-pay | Admitting: Podiatry

## 2018-09-01 ENCOUNTER — Ambulatory Visit: Payer: Medicare Other | Admitting: Podiatry

## 2018-09-01 VITALS — BP 180/110

## 2018-09-01 DIAGNOSIS — M79674 Pain in right toe(s): Secondary | ICD-10-CM

## 2018-09-01 DIAGNOSIS — M79675 Pain in left toe(s): Secondary | ICD-10-CM | POA: Diagnosis not present

## 2018-09-01 DIAGNOSIS — B351 Tinea unguium: Secondary | ICD-10-CM | POA: Diagnosis not present

## 2018-09-01 DIAGNOSIS — E1151 Type 2 diabetes mellitus with diabetic peripheral angiopathy without gangrene: Secondary | ICD-10-CM | POA: Diagnosis not present

## 2018-09-01 NOTE — Patient Instructions (Addendum)
Diabetes Mellitus and Foot Care Foot care is an important part of your health, especially when you have diabetes. Diabetes may cause you to have problems because of poor blood flow (circulation) to your feet and legs, which can cause your skin to:  Become thinner and drier.  Break more easily.  Heal more slowly.  Peel and crack. You may also have nerve damage (neuropathy) in your legs and feet, causing decreased feeling in them. This means that you may not notice minor injuries to your feet that could lead to more serious problems. Noticing and addressing any potential problems early is the best way to prevent future foot problems. How to care for your feet Foot hygiene  Wash your feet daily with warm water and mild soap. Do not use hot water. Then, pat your feet and the areas between your toes until they are completely dry. Do not soak your feet as this can dry your skin.  Trim your toenails straight across. Do not dig under them or around the cuticle. File the edges of your nails with an emery board or nail file.  Apply a moisturizing lotion or petroleum jelly to the skin on your feet and to dry, brittle toenails. Use lotion that does not contain alcohol and is unscented. Do not apply lotion between your toes. Shoes and socks  Wear clean socks or stockings every day. Make sure they are not too tight. Do not wear knee-high stockings since they may decrease blood flow to your legs.  Wear shoes that fit properly and have enough cushioning. Always look in your shoes before you put them on to be sure there are no objects inside.  To break in new shoes, wear them for just a few hours a day. This prevents injuries on your feet. Wounds, scrapes, corns, and calluses  Check your feet daily for blisters, cuts, bruises, sores, and redness. If you cannot see the bottom of your feet, use a mirror or ask someone for help.  Do not cut corns or calluses or try to remove them with medicine.  If you  find a minor scrape, cut, or break in the skin on your feet, keep it and the skin around it clean and dry. You may clean these areas with mild soap and water. Do not clean the area with peroxide, alcohol, or iodine.  If you have a wound, scrape, corn, or callus on your foot, look at it several times a day to make sure it is healing and not infected. Check for: ? Redness, swelling, or pain. ? Fluid or blood. ? Warmth. ? Pus or a bad smell. General instructions  Do not cross your legs. This may decrease blood flow to your feet.  Do not use heating pads or hot water bottles on your feet. They may burn your skin. If you have lost feeling in your feet or legs, you may not know this is happening until it is too late.  Protect your feet from hot and cold by wearing shoes, such as at the beach or on hot pavement.  Schedule a complete foot exam at least once a year (annually) or more often if you have foot problems. If you have foot problems, report any cuts, sores, or bruises to your health care provider immediately. Contact a health care provider if:  You have a medical condition that increases your risk of infection and you have any cuts, sores, or bruises on your feet.  You have an injury that is not   healing.  You have redness on your legs or feet.  You feel burning or tingling in your legs or feet.  You have pain or cramps in your legs and feet.  Your legs or feet are numb.  Your feet always feel cold.  You have pain around a toenail. Get help right away if:  You have a wound, scrape, corn, or callus on your foot and: ? You have pain, swelling, or redness that gets worse. ? You have fluid or blood coming from the wound, scrape, corn, or callus. ? Your wound, scrape, corn, or callus feels warm to the touch. ? You have pus or a bad smell coming from the wound, scrape, corn, or callus. ? You have a fever. ? You have a red line going up your leg. Summary  Check your feet every day  for cuts, sores, red spots, swelling, and blisters.  Moisturize feet and legs daily.  Wear shoes that fit properly and have enough cushioning.  If you have foot problems, report any cuts, sores, or bruises to your health care provider immediately.  Schedule a complete foot exam at least once a year (annually) or more often if you have foot problems. This information is not intended to replace advice given to you by your health care provider. Make sure you discuss any questions you have with your health care provider. Document Released: 07/31/2000 Document Revised: 09/15/2017 Document Reviewed: 09/04/2016 Elsevier Interactive Patient Education  2019 [employer]  Edema  Edema is when you have too much fluid in your body or under your skin. Edema may make your legs, feet, and ankles swell up. Swelling is also common in looser tissues, like around your eyes. This is a common condition. It gets more common as you get older. There are many possible causes of edema. Eating too much salt (sodium) and being on your feet or sitting for a long time can cause edema in your legs, feet, and ankles. Hot weather may make edema worse. Edema is usually painless. Your skin may look swollen or shiny. Follow these instructions at home:  Keep the swollen body part raised (elevated) above the level of your heart when you are sitting or lying down.  Do not sit still or stand for a long time.  Do not wear tight clothes. Do not wear garters on your upper legs.  Exercise your legs. This can help the swelling go down.  Wear elastic bandages or support stockings as told by your doctor.  Eat a low-salt (low-sodium) diet to reduce fluid as told by your doctor.  Depending on the cause of your swelling, you may need to limit how much fluid you drink (fluid restriction).  Take over-the-counter and prescription medicines only as told by your doctor. Contact a doctor if:  Treatment is not working.  You have  heart, liver, or kidney disease and have symptoms of edema.  You have sudden and unexplained weight gain. Get help right away if:  You have shortness of breath or chest pain.  You cannot breathe when you lie down.  You have pain, redness, or warmth in the swollen areas.  You have heart, liver, or kidney disease and get edema all of a sudden.  You have a fever and your symptoms get worse all of a sudden. Summary  Edema is when you have too much fluid in your body or under your skin.  Edema may make your legs, feet, and ankles swell up. Swelling is also common in  looser tissues, like around your eyes.  Raise (elevate) the swollen body part above the level of your heart when you are sitting or lying down.  Follow your doctor's instructions about diet and how much fluid you can drink (fluid restriction). This information is not intended to replace advice given to you by your health care provider. Make sure you discuss any questions you have with your health care provider. Document Released: 01/20/2008 Document Revised: 08/21/2016 Document Reviewed: 08/21/2016 Elsevier Interactive Patient Education  2019 [employer].

## 2018-09-09 ENCOUNTER — Inpatient Hospital Stay: Payer: Medicare Other | Attending: Oncology

## 2018-09-09 ENCOUNTER — Telehealth: Payer: Self-pay | Admitting: Nurse Practitioner

## 2018-09-09 ENCOUNTER — Inpatient Hospital Stay (HOSPITAL_BASED_OUTPATIENT_CLINIC_OR_DEPARTMENT_OTHER): Payer: Medicare Other | Admitting: Nurse Practitioner

## 2018-09-09 ENCOUNTER — Encounter: Payer: Self-pay | Admitting: Nurse Practitioner

## 2018-09-09 VITALS — BP 137/56 | HR 54 | Temp 98.1°F | Resp 18 | Ht 64.0 in | Wt 201.6 lb

## 2018-09-09 DIAGNOSIS — I10 Essential (primary) hypertension: Secondary | ICD-10-CM | POA: Insufficient documentation

## 2018-09-09 DIAGNOSIS — C241 Malignant neoplasm of ampulla of Vater: Secondary | ICD-10-CM | POA: Insufficient documentation

## 2018-09-09 DIAGNOSIS — Z85850 Personal history of malignant neoplasm of thyroid: Secondary | ICD-10-CM | POA: Insufficient documentation

## 2018-09-09 DIAGNOSIS — E119 Type 2 diabetes mellitus without complications: Secondary | ICD-10-CM | POA: Insufficient documentation

## 2018-09-09 DIAGNOSIS — Z86718 Personal history of other venous thrombosis and embolism: Secondary | ICD-10-CM | POA: Insufficient documentation

## 2018-09-09 DIAGNOSIS — Z85528 Personal history of other malignant neoplasm of kidney: Secondary | ICD-10-CM | POA: Insufficient documentation

## 2018-09-09 LAB — CMP (CANCER CENTER ONLY)
ALT: 64 U/L — ABNORMAL HIGH (ref 0–44)
AST: 70 U/L — ABNORMAL HIGH (ref 15–41)
Albumin: 3.5 g/dL (ref 3.5–5.0)
Alkaline Phosphatase: 138 U/L — ABNORMAL HIGH (ref 38–126)
Anion gap: 9 (ref 5–15)
BUN: 27 mg/dL — ABNORMAL HIGH (ref 8–23)
CO2: 24 mmol/L (ref 22–32)
Calcium: 9.1 mg/dL (ref 8.9–10.3)
Chloride: 107 mmol/L (ref 98–111)
Creatinine: 1.29 mg/dL — ABNORMAL HIGH (ref 0.44–1.00)
GFR, Est AFR Am: 45 mL/min — ABNORMAL LOW (ref >60)
GFR, Estimated: 39 mL/min — ABNORMAL LOW (ref >60)
Glucose, Bld: 252 mg/dL — ABNORMAL HIGH (ref 70–99)
Potassium: 4 mmol/L (ref 3.5–5.1)
Sodium: 140 mmol/L (ref 135–145)
Total Bilirubin: 0.7 mg/dL (ref 0.3–1.2)
Total Protein: 7.3 g/dL (ref 6.5–8.1)

## 2018-09-09 NOTE — Telephone Encounter (Signed)
Gave AVS and calendar. Patient given contrast

## 2018-09-09 NOTE — Progress Notes (Addendum)
  [facility] [place] [facility] OFFICE PROGRESS NOTE   Diagnosis: Ampullary carcinoma  INTERVAL HISTORY:   Kaylee Mullins returns as scheduled.  She reports increased back pain.  She thinks the pain is getting worse.  Left leg intermittently feels numb.  She has mid abdominal pain.  She is having trouble sleeping.  She describes her appetite as "fair".  She has diarrhea 1-2 times a month.  Objective:  Vital signs in last 24 hours:  Blood pressure (!) 137/56, pulse (!) 54, temperature 98.1 F (36.7 C), temperature source Oral, resp. rate 18, height 5\' 4"  (1.626 m), weight 201 lb 9.6 oz (91.4 kg), SpO2 98 %.    HEENT: White coating over tongue.  No buccal thrush. Resp: Lungs clear bilaterally. Cardio: Regular rate and rhythm. GI: No hepatomegaly.  No mass.  Nontender. Vascular: Chronic stasis change at the lower legs bilaterally.   Lab Results:  Lab Results  Component Value Date   WBC 6.6 04/29/2018   HGB 12.2 04/29/2018   HCT 37.1 04/29/2018   MCV 96 04/29/2018   PLT 217 04/29/2018   NEUTROABS 6.1 03/29/2017    Imaging:  No results found.  Medications: I have reviewed the patient's current medications.  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 CT abdomen/pelvis 10/23/2016-ampullary mass, single mildly enlarged porta hepatis lymph node, no evidence of distant metastatic disease ? Status post pancreaticoduodenectomy 01/12/2017; pT3pN2 ? CT abdomen/pelvis 03/30/2018- ablation defect within the upper pole of the right kidney. No evidence of recurrent renal mass. New mild left periaortic retroperitoneal lymphadenopathy. ? CT abdomen/pelvis 06/30/2018- 6 mm right middle lobe nodule-slowly growing over multiple CTs, stable ablation defect in the right kidney, 1.9 cm left periaortic node increased from 1.2 cm, stable 1.5 cm porta hepatis node 2. Biliary obstruction secondary to  #1, status post placement of a metal, bile duct stent on 10/23/2016  3. 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 Renal cell carcinoma-status post ablation of a right renal mass 07/15/2016, biopsy confirmed papillary renal cell carcinoma, Fuhrman grade 3  5. CVA in 2016  6. History of thyroid cancer-status post thyroidectomy and radioactive iodine in 2005  7. Diabetes  8.Hypertension  9.Left lower extremity deep vein thrombosis June 2019, IVC filter placed July 2018 after she was diagnosed with a rectus hematoma while on Lovenox     Disposition: Kaylee Mullins appears unchanged.  She is having increased back and abdominal pain.  We will follow-up on the CA-19-9 from today.  We are referring her for a restaging CT scan of the abdomen/pelvis in the next 1 to 2 weeks.  She will return for a follow-up visit on 09/20/2018 to review the results.  She will contact the office in the interim with any problems.  Patient seen with Dr. Benay Spice.    Kaylee Mullins ANP/GNP-BC   09/09/2018  11:08 AM  This was a shared visit with Kaylee Mullins.  Kaylee Mullins was interviewed and examined.  Etiology of her symptoms is unclear.  She will undergo a restaging CT evaluation and return for an office visit in approximately 2 weeks.  [name], MD

## 2018-09-10 LAB — CANCER ANTIGEN 19-9: CA 19-9: 515 U/mL — ABNORMAL HIGH (ref 0–35)

## 2018-09-16 ENCOUNTER — Ambulatory Visit (HOSPITAL_COMMUNITY)
Admission: RE | Admit: 2018-09-16 | Discharge: 2018-09-16 | Disposition: A | Discharge status: Home / Self Care | Payer: Medicare Other | Source: Ambulatory Visit | Attending: Nurse Practitioner | Admitting: Nurse Practitioner

## 2018-09-16 DIAGNOSIS — C241 Malignant neoplasm of ampulla of Vater: Secondary | ICD-10-CM | POA: Insufficient documentation

## 2018-09-16 MED ORDER — SODIUM CHLORIDE (PF) 0.9 % IJ SOLN
INTRAMUSCULAR | Status: AC
  Filled 2018-09-16: qty 50

## 2018-09-16 MED ORDER — IOHEXOL 300 MG/ML  SOLN
100.0000 mL | Freq: Once | INTRAMUSCULAR | Status: AC | PRN
  Administered 2018-09-16: 80 mL via INTRAVENOUS

## 2018-09-18 NOTE — Progress Notes (Signed)
Subjective: Kaylee Mullins presents today referred by Nolene Ebbs, MD with cc of painful, discolored, thick toenails which interfere with daily activities.  Pain is aggravated when wearing enclosed shoe gear.   Ms. Cast states she has been diabetic for about 20 years.  She denies any foot wounds. She denies any numbness, tingling or burning in feet.   Last A1c: 8.4  Past Medical History:  Diagnosis Date  . Acute CVA (cerebrovascular accident) ([place]) 09/16/2014  . Acute DVT of left tibial vein ([place]) 04/03/2017  . Adenocarcinoma ([place]) 01/12/2017  . Ampullary carcinoma ([place]) 10/27/2016  . Anxiety   . Arthritis    knees  . Black tarry stools    05-14-16 negative for occult blood with ER visit- noted in [place].  . Cancer [facility])    thyroid cancer- surgery and radiation  . Cerebral infarction due to unspecified mechanism   . Chronic kidney disease    questionable mass on kidney. Being followed by Dr Diona Fanti  . Complication of anesthesia    heart rate was really low  . CVA (cerebral infarction) 09/11/2014  . Depression   . Diabetes mellitus    type 2  . Diabetes mellitus without complication ([place]) 08/18/4578   Qualifier: Diagnosis of  By: Loanne Drilling MD, Jacelyn Pi   . Dyslipidemia 04/20/2007   Qualifier: Diagnosis of  By: Loanne Drilling MD, Jacelyn Pi   . Full dentures   . Gastroparesis 02/06/2017  . GERD (gastroesophageal reflux disease) 04/03/2017  . History of CVA (cerebral vascular accident) ([place]) 09/11/2014  . Hypertension   . Hypokalemia 08/25/2017  . Hypothyroidism   . Lumbar stenosis with neurogenic claudication 02/10/2016  . Osteoarthritis 09/11/2014  . Pneumonia   . Right renal mass 09/13/2014  . Spinal stenosis   . Stroke ([place]) 09/2014   left sided weakness  . SVT (supraventricular tachycardia) ([place]) 07/30/2017     Patient Active Problem List   Diagnosis Date Noted  . Hypokalemia 08/25/2017  . Spinal stenosis   . Pneumonia   . Full dentures   . Depression   . Complication of anesthesia    . Chronic kidney disease   . Cancer ([place])   . Black tarry stools   . Arthritis   . Anxiety   . Hypertension 08/03/2017  . Paroxysmal SVT (supraventricular tachycardia) ([place]) 08/03/2017  . History of CVA (cerebrovascular accident) 08/03/2017  . Hypothyroidism, adult 08/03/2017  . Chronic diastolic heart failure ([place]) 08/03/2017  . History of cancer of ampulla of duodenum 08/03/2017  . Left flank pain 08/03/2017  . Diabetes mellitus type 2, uncontrolled ([place])   . CKD stage 3 due to type 2 diabetes mellitus ([place])   . SVT (supraventricular tachycardia) ([place]) 07/30/2017  . Edema extremities 07/30/2017  . DVT, lower extremity, recurrent, bilateral ([place]) 07/30/2017  . On total parenteral nutrition (TPN) 04/08/2017  . Hematoma of rectus sheath 04/03/2017  . GERD (gastroesophageal reflux disease) 04/03/2017  . Malnutrition of moderate degree 02/07/2017  . Gastroparesis 02/06/2017  . Adenocarcinoma ([place]) 01/12/2017  . Ampullary carcinoma ([place]) 10/27/2016  . Essential hypertension 02/11/2016  . Lumbar stenosis with neurogenic claudication 02/10/2016  . Hyperlipidemia 02/10/2016  . Hypothyroidism 02/10/2016  . Musculoskeletal neck pain 12/13/2014  . Muscular pain 12/13/2014  . Stroke ([place]) 09/17/2014  . Numbness   . Acute CVA (cerebrovascular accident) ([place]) 09/16/2014  . Right renal mass 09/13/2014  . Cerebral infarction due to unspecified mechanism   . CVA (cerebral infarction) 09/11/2014  . Osteoarthritis 09/11/2014  . Postsurgical hypothyroidism  09/11/2014  . History of CVA (cerebral vascular accident) ([place]) 09/11/2014  . Diabetes mellitus without complication ([place]) 16/05/9603  . Dyslipidemia 04/20/2007     Past Surgical History:  Procedure Laterality Date  . ABDOMINAL HYSTERECTOMY     partial  . cataracts     Removed  11/2015  bilateral  . COLONOSCOPY W/ POLYPECTOMY    . ERCP N/A 05/07/2016   Procedure: ENDOSCOPIC RETROGRADE CHOLANGIOPANCREATOGRAPHY (ERCP);  Surgeon: Clarene Essex, MD;  Location: [facility] ENDOSCOPY;  Service: Endoscopy;  Laterality: N/A;  . ERCP N/A 10/16/2016   Procedure: ENDOSCOPIC RETROGRADE CHOLANGIOPANCREATOGRAPHY (ERCP);  Surgeon: Clarene Essex, MD;  Location[facility] ENDOSCOPY;  Service: Endoscopy;  Laterality: N/A;  . ESOPHAGOGASTRODUODENOSCOPY N/A 02/10/2017   Procedure: ESOPHAGOGASTRODUODENOSCOPY (EGD);  Surgeon: Teena Irani, MD;  Location: [facility] ENDOSCOPY;  Service: Endoscopy;  Laterality: N/A;  . EUS N/A 05/27/2016   Procedure: ESOPHAGEAL ENDOSCOPIC ULTRASOUND (EUS) RADIAL;  Surgeon: Arta Silence, MD;  Location: [facility] ENDOSCOPY;  Service: Endoscopy;  Laterality: N/A;  . FLEXIBLE SIGMOIDOSCOPY  03/29/2012   Procedure: FLEXIBLE SIGMOIDOSCOPY;  Surgeon: Jeryl [place], MD;  Location: [facility] ENDOSCOPY;  Service: Endoscopy;  Laterality: N/A;  fleet enema upon arrival  . HOT HEMOSTASIS  03/29/2012   Procedure: HOT HEMOSTASIS (ARGON PLASMA COAGULATION/BICAP);  Surgeon: Jeryl [place], MD;  Location: [facility] ENDOSCOPY;  Service: Endoscopy;  Laterality: N/A;  . IR GASTR TUBE CONVERT GASTR-JEJ PER W/FL MOD SED  02/23/2017  . IR GASTROSTOMY TUBE MOD SED  02/16/2017  . IR GENERIC HISTORICAL  06/30/2016   IR RADIOLOGIST EVAL & MGMT 06/30/2016 Aletta Edouard, MD [facility] INTERV RAD  . IR GENERIC HISTORICAL  09/09/2016   IR RADIOLOGIST EVAL & MGMT 09/09/2016 Aletta Edouard, MD [facility] INTERV RAD  . IR IVC FILTER PLMT / S&I /IMG GUID/MOD SED  03/03/2017  . IR PATIENT EVAL TECH 0-60 MINS  05/12/2017  . IR RADIOLOGIST EVAL & MGMT  11/24/2017  . IR RADIOLOGIST EVAL & MGMT  04/07/2018  . LAPAROSCOPY N/A 01/12/2017   Procedure: LAPAROSCOPY DIAGNOSTIC;  Surgeon: Stark Klein, MD;  Location: [place];  Service: General;  Laterality: N/A;  . LAPAROTOMY N/A 03/10/2017   Procedure: EXPLORATORY LAPAROTOMY Open jejunostomy tube;  Surgeon: Stark Klein, MD;  Location: [place];  Service: General;  Laterality: N/A;  . LUMBAR LAMINECTOMY/DECOMPRESSION MICRODISCECTOMY N/A 02/10/2016   Procedure: Lumbar three- four  Laminectomy;  Surgeon: Kristeen Miss, MD;  Location: [place] NEURO ORS;  Service: Neurosurgery;  Laterality: N/A;  L3-4 Laminectomy  . LUMBAR SPINE SURGERY     1st surgery "ray cage placed"  . THYROIDECTOMY  2005  . TONSILLECTOMY    . WHIPPLE PROCEDURE N/A 01/12/2017   Procedure: WHIPPLE PROCEDURE;  Surgeon: Stark Klein, MD;  Location: [place];  Service: General;  Laterality: N/A;      Current Outpatient Medications:  .  ACCU-CHEK AVIVA PLUS test strip, TEST 3 TIMES A DAY AS DIRECTED, Disp: , Rfl: 1 .  acetaminophen (TYLENOL) 500 MG tablet, Take 500 mg by mouth every 6 (six) hours as needed for mild pain., Disp: , Rfl:  .  B-D UF III MINI PEN NEEDLES 31G X 5 MM MISC, USE AS DIRECTED WITH HUMALOG, Disp: , Rfl: 0 .  furosemide (LASIX) 80 MG tablet, Take as directed.  Take 80 mg daily alternating with 40 mg daily, Disp: , Rfl:  .  HUMALOG KWIKPEN 100 UNIT/ML KwikPen, , Disp: , Rfl:  .  insulin detemir (LEVEMIR) 100 UNIT/ML injection, Inject 0.07 mLs (7 Units total) into the  skin 2 (two) times daily. (Patient taking differently: Inject 30 Units into the skin daily. Pt is on a sliding scale), Disp: 10 mL, Rfl: 0 .  irbesartan (AVAPRO) 300 MG tablet, Take 1 tablet (300 mg total) by mouth daily. (Patient not taking: Reported on 09/09/2018), Disp: 30 tablet, Rfl: 0 .  LEVEMIR FLEXTOUCH 100 UNIT/ML Pen, Inject 20 Units into the skin as needed., Disp: , Rfl: 5 .  levothyroxine (SYNTHROID, LEVOTHROID) 200 MCG tablet, Take 200 mcg by mouth daily before breakfast. For hypothyroidism, Disp: , Rfl: 99 .  losartan (COZAAR) 100 MG tablet, Take 100 mg by mouth daily., Disp: , Rfl:  .  NIFEdipine (PROCARDIA-XL/ADALAT-CC/NIFEDICAL-XL) 30 MG 24 hr tablet, Take 30 mg by mouth daily., Disp: , Rfl: 2 .  pantoprazole (PROTONIX) 40 MG tablet, Take 1 tablet (40 mg total) by mouth at bedtime., Disp: 30 tablet, Rfl: 0 .  potassium chloride SA (KLOR-CON M20) 20 MEQ tablet, Take 3 tablets daily alternating with 1 tablet daily.   Take with furosemide., Disp: 270 tablet, Rfl: 3 .  rosuvastatin (CRESTOR) 20 MG tablet, Take 20 mg by mouth daily., Disp: , Rfl: 1 .  Vitamin D, Ergocalciferol, (DRISDOL) 50000 units CAPS capsule, Take 50,000 Units by mouth once a week., Disp: , Rfl: 5 .  warfarin (COUMADIN) 7.5 MG tablet, Take 1 tablet (7.5 mg total) by mouth one time only at 6 PM., Disp: 30 tablet, Rfl: 0 .  carvedilol (COREG) 25 MG tablet, Take 1 tablet (25 mg total) by mouth 2 (two) times daily., Disp: 180 tablet, Rfl: 3   Allergies  Allergen Reactions  . Hydralazine Hcl Shortness Of Breath    Pt had severe respiratory distress after receiving a dose  . Amlodipine Besylate Swelling  . Darvon [Propoxyphene Hcl] Other (See Comments)    Hallucinations   . Nyquil Multi-Symptom [Pseudoeph-Doxylamine-Dm-Apap] Other (See Comments)    Makes pt not "feel right in her head"  . Metformin And Related Diarrhea     Social History   Occupational History  . Occupation: housewife    Comment: [employer] housekeeping  Tobacco Use  . Smoking status: Never Smoker  . Smokeless tobacco: Never Used  Substance and Sexual Activity  . Alcohol use: No  . Drug use: No  . Sexual activity: Never     Family History  Problem Relation Age of Onset  . Heart failure Mother   . Heart attack Father      Immunization History  Administered Date(s) Administered  . Influenza, High Dose Seasonal PF 07/08/2018  . PPD Test 02/14/2016     Review of systems: Positive Findings in bold print.  Constitutional:  chills, fatigue, fever, sweats, weight change Communication: Optometrist, sign Ecologist, hand writing, iPad/Android device Head: headaches, head injury Eyes: changes in vision, eye pain, glaucoma, cataracts, macular degeneration, diplopia, glare,  light sensitivity, eyeglasses or contacts, blindness Ears nose mouth throat: Hard of hearing, ringing in ears, deaf, sign language,  vertigo,   nosebleeds,  rhinitis,  cold sores,  snoring, swollen glands Cardiovascular: HTN, edema, arrhythmia, pacemaker in place, defibrillator in place,  chest pain/tightness, chronic anticoagulation, blood clot, heart failure Peripheral Vascular: leg cramps, varicose veins, blood clots, lymphedema Respiratory:  difficulty breathing, denies congestion, SOB, wheezing, cough, emphysema Gastrointestinal: change in appetite or weight, abdominal pain, constipation, diarrhea, nausea, vomiting, vomiting blood, change in bowel habits, abdominal pain, jaundice, rectal bleeding, hemorrhoids, Genitourinary:  nocturia,  pain on urination,  blood in urine, Foley catheter, urinary urgency Musculoskeletal: uses mobility  aid,  cramping, stiff joints, painful joints, decreased joint motion, fractures, OA, gout Skin: +changes in toenails, color change, dryness, itching, mole changes,  rash  Neurological: headaches, numbness in feet, paresthesias in feet, burning in feet, fainting,  seizures, change in speech. denies headaches, memory problems/poor historian, cerebral palsy, weakness, paralysis Endocrine: diabetes, hypothyroidism, hyperthyroidism,  goiter, dry mouth, flushing, heat intolerance,  cold intolerance,  excessive thirst, denies polyuria,  nocturia Hematological:  easy bleeding, excessive bleeding, easy bruising, enlarged lymph nodes, on long term blood thinner, history of past transusions Allergy/immunological:  hives, eczema, frequent infections, multiple drug allergies, seasonal allergies, transplant recipient Psychiatric:  anxiety, depression, mood disorder, suicidal ideations, hallucinations   Objective: Vascular Examination: Capillary refill time immediate x 10 digits Dorsalis pedis 2/4 b/l Posterior tibial pulses 0/4 b/l  No digital hair x 10 digits Skin temperature gradient WNL b/l Edema BLE and ankles Venous stasis skin changes BLE  Dermatological Examination: Skin with normal  texture and tone b/l  Toenails 1-5 b/l discolored,  thick, dystrophic with subungual debris and pain with palpation to nailbeds due to thickness of nails.  Musculoskeletal: Muscle strength 5/5 to all RLE muscle groups  Left sided weakness LE secondary to CVA  Neurological: Sensation intact with 10 gram monofilament Vibratory sensation intact.  Assessment: 1. Painful onychomycosis toenails 1-5 b/l  2. NIDDM with peripheral circulatory disorder   Plan: 1. Discussed onychomycosis and treatment options.  Literature dispensed on today. 2. Toenails 1-5 b/l were debrided in length and girth without iatrogenic bleeding. 3. Patient to continue soft, supportive shoe gear 4. Patient to report any pedal injuries to medical professional immediately. 5. Follow up 3 months. Patient/POA to call should there be a concern in the interim.

## 2018-09-20 ENCOUNTER — Inpatient Hospital Stay: Payer: Medicare Other | Admitting: Nurse Practitioner

## 2018-09-26 ENCOUNTER — Telehealth: Payer: Self-pay

## 2018-09-26 ENCOUNTER — Encounter: Payer: Self-pay | Admitting: Nurse Practitioner

## 2018-09-26 ENCOUNTER — Inpatient Hospital Stay: Payer: Medicare Other | Attending: Oncology | Admitting: Nurse Practitioner

## 2018-09-26 VITALS — BP 135/56 | HR 52 | Temp 97.9°F | Resp 20 | Ht 64.0 in | Wt 197.8 lb

## 2018-09-26 DIAGNOSIS — Z85850 Personal history of malignant neoplasm of thyroid: Secondary | ICD-10-CM | POA: Insufficient documentation

## 2018-09-26 DIAGNOSIS — Z85528 Personal history of other malignant neoplasm of kidney: Secondary | ICD-10-CM | POA: Diagnosis not present

## 2018-09-26 DIAGNOSIS — Z86718 Personal history of other venous thrombosis and embolism: Secondary | ICD-10-CM | POA: Diagnosis not present

## 2018-09-26 DIAGNOSIS — C241 Malignant neoplasm of ampulla of Vater: Secondary | ICD-10-CM | POA: Insufficient documentation

## 2018-09-26 DIAGNOSIS — E119 Type 2 diabetes mellitus without complications: Secondary | ICD-10-CM | POA: Diagnosis not present

## 2018-09-26 NOTE — Progress Notes (Addendum)
[facility] OFFICE PROGRESS NOTE   Diagnosis: Ampullary carcinoma  INTERVAL HISTORY:   Kaylee Mullins returns for follow-up.  She thinks the back pain may be a little better.  The pain occurs intermittently at the low back.  She notes improvement when she wears a "girdle".  She does not require pain medication.  She reports a good appetite.  Objective:  Vital signs in last 24 hours:  Blood pressure (!) 135/56, pulse (!) 52, temperature 97.9 F (36.6 C), temperature source Oral, resp. rate 20, height 5\' 4"  (1.626 m), weight 197 lb 12.8 oz (89.7 kg), SpO2 100 %.    HEENT: Mild white coating over tongue.  No buccal thrush. Resp: Lungs clear bilaterally. Cardio: Regular rate and rhythm. GI: Abdomen soft.  Mild tenderness left mid abdomen.  No hepatomegaly. Vascular: Chronic stasis change at the lower legs bilaterally.   Lab Results:  Lab Results  Component Value Date   WBC 6.6 04/29/2018   HGB 12.2 04/29/2018   HCT 37.1 04/29/2018   MCV 96 04/29/2018   PLT 217 04/29/2018   NEUTROABS 6.1 03/29/2017    Imaging:  No results found.  Medications: I have reviewed the patient's current medications.  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 CT abdomen/pelvis 10/23/2016-ampullary mass, single mildly enlarged porta hepatis lymph node, no evidence of distant metastatic disease ? Status post pancreaticoduodenectomy 01/12/2017; pT3pN2 ? CT abdomen/pelvis 03/30/2018- ablation defect within the upper pole of the right kidney. No evidence of recurrent renal mass. New mild left periaortic retroperitoneal lymphadenopathy. ? CT abdomen/pelvis 06/30/2018- 6 mm right middle lobe nodule-slowly growing over multiple CTs, stable ablation defect in the right kidney, 1.9 cm left periaortic node increased from 1.2 cm, stable 1.5 cm porta hepatis node ? CT abdomen/pelvis 09/17/2018-  surgical changes related to Whipple procedure.  Stable upper abdominal/retroperitoneal lymphadenopathy.  5 mm right middle lobe nodule, stable from most recent CT but mildly progressive from priors.  Postprocedural changes related to renal ablation in the medial right upper kidney. 2. Biliary obstruction secondary to #1, status post placement of a metal, bile duct stent on 10/23/2016  3. 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 Renal cell carcinoma-status post ablation of a right renal mass 07/15/2016, biopsy confirmed papillary renal cell carcinoma, Fuhrman grade 3  5. CVA in 2016  6. History of thyroid cancer-status post thyroidectomy and radioactive iodine in 2005  7. Diabetes  8.Hypertension  9.Left lower extremity deep vein thrombosis June 2019, IVC filter placed July 2018 after she was diagnosed with a rectus hematoma while on Lovenox  Disposition: Kaylee Mullins appears unchanged.  She continues to have intermittent low back pain.  We reviewed the CT report and images from 09/17/2018 with her at today's visit.  The adenopathy is not significantly changed as compared to 3 months ago.  A tiny lung nodule is unchanged as well.  We will continue to follow with observation.  She will return for lab and follow-up in 2 months.  She will contact the office in the interim with any problems.  Patient seen with Dr. Benay Spice.    [name] ANP/GNP-BC   09/26/2018  11:15 AM  This was a shared visit with [name].  The restaging CT reveals no evidence of disease progression.  The CA 19-9 is elevated, but she appears asymptomatic from ampullary carcinoma.  I recommend continued observation.  We reviewed the CT images  with Kaylee Mullins.  [name], MD

## 2018-09-26 NOTE — Telephone Encounter (Signed)
Printed vs and calender of upcoming appointment. Per 2/10 los

## 2018-09-27 ENCOUNTER — Telehealth: Payer: Self-pay | Admitting: *Deleted

## 2018-09-27 NOTE — Telephone Encounter (Signed)
Patient called concerned about the "new" lung nodule on her recent CT scan and is asking if this means her cancer has spread and will she need chemo? Informed her that the 5 mm lung nodule is not new-was there in August and has not changed. Added that it is not uncommon to have a pulmonary nodule and it to be benign. Informed her that Dr. Benay Spice said she has no evidence of disease progression and needing treatment.

## 2018-09-30 ENCOUNTER — Ambulatory Visit: Payer: Medicare Other | Admitting: Nurse Practitioner

## 2018-11-07 ENCOUNTER — Telehealth: Payer: Self-pay | Admitting: Internal Medicine

## 2018-11-07 NOTE — Telephone Encounter (Signed)
Called patient  She is doing OK With concerns for corona virus would recomm follow up early September Pt understands   Will call for problems

## 2018-11-08 NOTE — Telephone Encounter (Signed)
Pts 3/27 appt canceled per Dr. Harrington Challenger Plans to f/u in September Will route to cancel pool

## 2018-11-08 NOTE — Telephone Encounter (Signed)
3-3/27 

## 2018-11-11 ENCOUNTER — Ambulatory Visit: Payer: Medicare Other | Admitting: Internal Medicine

## 2018-11-18 ENCOUNTER — Other Ambulatory Visit: Payer: Self-pay | Admitting: Nurse Practitioner

## 2018-11-18 DIAGNOSIS — C241 Malignant neoplasm of ampulla of Vater: Secondary | ICD-10-CM

## 2018-11-21 ENCOUNTER — Other Ambulatory Visit: Payer: Medicare Other

## 2018-11-21 ENCOUNTER — Ambulatory Visit: Payer: Medicare Other | Admitting: Oncology

## 2018-11-23 NOTE — Telephone Encounter (Signed)
Per Dr. Harrington Challenger reschedule pt to 04/2019. Pt is agreeable to move appt to 05/01/19 @ 11 am. Pt thanked me for the call. I will remove from the Covid-19 cancel pool.

## 2018-12-01 ENCOUNTER — Ambulatory Visit: Payer: Medicare Other | Admitting: Podiatry

## 2018-12-19 ENCOUNTER — Telehealth: Payer: Self-pay | Admitting: *Deleted

## 2018-12-19 NOTE — Telephone Encounter (Addendum)
Called to report lower back pain from center to left side rated "10". Pain is constant ache and prevents her from sleeping in her bed. Sleeps in recliner. Started 2 weeks ago and has gotten worse. Has tried OTC Tylenol and Ibuprofen with minimal relief as well as BenGay ointment. Had one oxycodone on hand and took this with good relief of pain. Asking for a script for pain, such as oxycodone or vicodin. Per Dr. Benay Spice: Patient needs to be seen tomorrow. She agrees to come at [facility]. Explained the precautions we are taking upon entry.

## 2018-12-20 ENCOUNTER — Inpatient Hospital Stay: Payer: Medicare Other | Admitting: Oncology

## 2018-12-20 ENCOUNTER — Telehealth: Payer: Self-pay | Admitting: *Deleted

## 2018-12-20 NOTE — Telephone Encounter (Signed)
Called to report her daughter came over last night and massaged her back and she took #3 Tylenol 325 mg tablets and her back pain is much improved. Wants to cancel her appointment today and will return on 5/22 as scheduled unless she develops the pain again.

## 2019-01-02 ENCOUNTER — Telehealth: Payer: Self-pay | Admitting: Oncology

## 2019-01-02 NOTE — Telephone Encounter (Signed)
Rescheduled patient appt per MD scheduling log.  Patient aware of appt date and time.

## 2019-01-06 ENCOUNTER — Other Ambulatory Visit: Payer: Medicare Other

## 2019-01-06 ENCOUNTER — Ambulatory Visit: Payer: Medicare Other | Admitting: Oncology

## 2019-01-19 ENCOUNTER — Other Ambulatory Visit: Payer: Self-pay | Admitting: General Surgery

## 2019-01-19 DIAGNOSIS — C241 Malignant neoplasm of ampulla of Vater: Secondary | ICD-10-CM

## 2019-02-02 ENCOUNTER — Telehealth: Payer: Self-pay | Admitting: Oncology

## 2019-02-02 NOTE — Telephone Encounter (Signed)
Returned call re rescheduling 6/25 lab/fu. Spoke with patient re lab/fu 7/31.

## 2019-02-07 ENCOUNTER — Other Ambulatory Visit: Payer: Medicare Other

## 2019-02-09 ENCOUNTER — Ambulatory Visit: Payer: Medicare Other | Admitting: Oncology

## 2019-02-09 ENCOUNTER — Other Ambulatory Visit: Payer: Medicare Other

## 2019-03-13 ENCOUNTER — Other Ambulatory Visit: Payer: Medicare Other

## 2019-03-17 ENCOUNTER — Other Ambulatory Visit: Payer: Medicare Other

## 2019-03-17 ENCOUNTER — Ambulatory Visit: Payer: Medicare Other | Admitting: Oncology

## 2019-03-23 ENCOUNTER — Ambulatory Visit
Admission: RE | Admit: 2019-03-23 | Discharge: 2019-03-23 | Disposition: A | Discharge status: Home / Self Care | Payer: Medicare Other | Source: Ambulatory Visit | Attending: General Surgery | Admitting: General Surgery

## 2019-03-23 DIAGNOSIS — C241 Malignant neoplasm of ampulla of Vater: Secondary | ICD-10-CM

## 2019-03-23 MED ORDER — IOPAMIDOL (ISOVUE-300) INJECTION 61%
100.0000 mL | Freq: Once | INTRAVENOUS | Status: AC | PRN
  Administered 2019-03-23: 100 mL via INTRAVENOUS

## 2019-03-28 ENCOUNTER — Telehealth: Payer: Self-pay

## 2019-03-28 ENCOUNTER — Inpatient Hospital Stay: Payer: Medicare Other | Attending: Internal Medicine

## 2019-03-28 ENCOUNTER — Inpatient Hospital Stay: Payer: Medicare Other | Admitting: Nurse Practitioner

## 2019-03-28 NOTE — Telephone Encounter (Signed)
TC from Ms. Picha stating she could not come in today because she is having loose stools which started today. Pt.  also stated she had been vomiting while bile like color vomit.Last week getting into her son's car she hit her leg and stated it was swollen, she also stated she sent a picture to her primary Dr. And stated she has an appointment with her Dr. Marylene Buerger Discussed with Lattie Haw Pt's symptoms per Lattie Haw informed Pt to discuss with her primary Dr. Her symptoms and we will reschedule her appointment in a couple weeks

## 2019-04-12 ENCOUNTER — Encounter (HOSPITAL_COMMUNITY): Payer: Self-pay | Admitting: Emergency Medicine

## 2019-04-12 ENCOUNTER — Emergency Department (HOSPITAL_COMMUNITY): Payer: Medicare Other

## 2019-04-12 ENCOUNTER — Inpatient Hospital Stay (HOSPITAL_COMMUNITY)
Admission: EM | Admit: 2019-04-12 | Discharge: 2019-04-14 | DRG: 605 | Disposition: A | Discharge status: Home / Self Care | Payer: Medicare Other | Source: Ambulatory Visit | Attending: Internal Medicine | Admitting: Internal Medicine

## 2019-04-12 ENCOUNTER — Inpatient Hospital Stay (HOSPITAL_COMMUNITY): Payer: Medicare Other

## 2019-04-12 DIAGNOSIS — E11628 Type 2 diabetes mellitus with other skin complications: Secondary | ICD-10-CM | POA: Diagnosis present

## 2019-04-12 DIAGNOSIS — Z79899 Other long term (current) drug therapy: Secondary | ICD-10-CM | POA: Diagnosis not present

## 2019-04-12 DIAGNOSIS — E1122 Type 2 diabetes mellitus with diabetic chronic kidney disease: Secondary | ICD-10-CM | POA: Diagnosis present

## 2019-04-12 DIAGNOSIS — E785 Hyperlipidemia, unspecified: Secondary | ICD-10-CM | POA: Diagnosis present

## 2019-04-12 DIAGNOSIS — E039 Hypothyroidism, unspecified: Secondary | ICD-10-CM | POA: Diagnosis not present

## 2019-04-12 DIAGNOSIS — E119 Type 2 diabetes mellitus without complications: Secondary | ICD-10-CM

## 2019-04-12 DIAGNOSIS — M170 Bilateral primary osteoarthritis of knee: Secondary | ICD-10-CM | POA: Diagnosis present

## 2019-04-12 DIAGNOSIS — Z9842 Cataract extraction status, left eye: Secondary | ICD-10-CM

## 2019-04-12 DIAGNOSIS — I82542 Chronic embolism and thrombosis of left tibial vein: Secondary | ICD-10-CM | POA: Diagnosis not present

## 2019-04-12 DIAGNOSIS — S8011XA Contusion of right lower leg, initial encounter: Secondary | ICD-10-CM | POA: Diagnosis present

## 2019-04-12 DIAGNOSIS — I69354 Hemiplegia and hemiparesis following cerebral infarction affecting left non-dominant side: Secondary | ICD-10-CM

## 2019-04-12 DIAGNOSIS — Z8509 Personal history of malignant neoplasm of other digestive organs: Secondary | ICD-10-CM | POA: Diagnosis not present

## 2019-04-12 DIAGNOSIS — M79604 Pain in right leg: Secondary | ICD-10-CM | POA: Diagnosis not present

## 2019-04-12 DIAGNOSIS — E1165 Type 2 diabetes mellitus with hyperglycemia: Secondary | ICD-10-CM | POA: Diagnosis present

## 2019-04-12 DIAGNOSIS — K3184 Gastroparesis: Secondary | ICD-10-CM | POA: Diagnosis present

## 2019-04-12 DIAGNOSIS — Z9841 Cataract extraction status, right eye: Secondary | ICD-10-CM | POA: Diagnosis not present

## 2019-04-12 DIAGNOSIS — Z8701 Personal history of pneumonia (recurrent): Secondary | ICD-10-CM

## 2019-04-12 DIAGNOSIS — Z90711 Acquired absence of uterus with remaining cervical stump: Secondary | ICD-10-CM

## 2019-04-12 DIAGNOSIS — L03115 Cellulitis of right lower limb: Secondary | ICD-10-CM

## 2019-04-12 DIAGNOSIS — Z794 Long term (current) use of insulin: Secondary | ICD-10-CM | POA: Diagnosis not present

## 2019-04-12 DIAGNOSIS — E1143 Type 2 diabetes mellitus with diabetic autonomic (poly)neuropathy: Secondary | ICD-10-CM | POA: Diagnosis present

## 2019-04-12 DIAGNOSIS — N2889 Other specified disorders of kidney and ureter: Secondary | ICD-10-CM | POA: Diagnosis present

## 2019-04-12 DIAGNOSIS — Z79890 Hormone replacement therapy (postmenopausal): Secondary | ICD-10-CM | POA: Diagnosis not present

## 2019-04-12 DIAGNOSIS — Z7901 Long term (current) use of anticoagulants: Secondary | ICD-10-CM | POA: Diagnosis not present

## 2019-04-12 DIAGNOSIS — Z85850 Personal history of malignant neoplasm of thyroid: Secondary | ICD-10-CM | POA: Diagnosis not present

## 2019-04-12 DIAGNOSIS — N183 Chronic kidney disease, stage 3 (moderate): Secondary | ICD-10-CM | POA: Diagnosis present

## 2019-04-12 DIAGNOSIS — Z86718 Personal history of other venous thrombosis and embolism: Secondary | ICD-10-CM

## 2019-04-12 DIAGNOSIS — L0390 Cellulitis, unspecified: Secondary | ICD-10-CM | POA: Diagnosis present

## 2019-04-12 DIAGNOSIS — Z888 Allergy status to other drugs, medicaments and biological substances status: Secondary | ICD-10-CM

## 2019-04-12 DIAGNOSIS — R739 Hyperglycemia, unspecified: Secondary | ICD-10-CM

## 2019-04-12 DIAGNOSIS — I10 Essential (primary) hypertension: Secondary | ICD-10-CM | POA: Diagnosis not present

## 2019-04-12 DIAGNOSIS — I130 Hypertensive heart and chronic kidney disease with heart failure and stage 1 through stage 4 chronic kidney disease, or unspecified chronic kidney disease: Secondary | ICD-10-CM | POA: Diagnosis present

## 2019-04-12 DIAGNOSIS — E890 Postprocedural hypothyroidism: Secondary | ICD-10-CM | POA: Diagnosis present

## 2019-04-12 DIAGNOSIS — K219 Gastro-esophageal reflux disease without esophagitis: Secondary | ICD-10-CM | POA: Diagnosis present

## 2019-04-12 DIAGNOSIS — C241 Malignant neoplasm of ampulla of Vater: Secondary | ICD-10-CM

## 2019-04-12 DIAGNOSIS — I5032 Chronic diastolic (congestive) heart failure: Secondary | ICD-10-CM | POA: Diagnosis present

## 2019-04-12 DIAGNOSIS — W228XXA Striking against or struck by other objects, initial encounter: Secondary | ICD-10-CM | POA: Diagnosis present

## 2019-04-12 DIAGNOSIS — Z972 Presence of dental prosthetic device (complete) (partial): Secondary | ICD-10-CM | POA: Diagnosis not present

## 2019-04-12 DIAGNOSIS — Z90411 Acquired partial absence of pancreas: Secondary | ICD-10-CM

## 2019-04-12 DIAGNOSIS — Z8249 Family history of ischemic heart disease and other diseases of the circulatory system: Secondary | ICD-10-CM

## 2019-04-12 DIAGNOSIS — Z9049 Acquired absence of other specified parts of digestive tract: Secondary | ICD-10-CM

## 2019-04-12 LAB — COMPREHENSIVE METABOLIC PANEL
ALT: 23 U/L (ref 0–44)
AST: 33 U/L (ref 15–41)
Albumin: 3.3 g/dL — ABNORMAL LOW (ref 3.5–5.0)
Alkaline Phosphatase: 89 U/L (ref 38–126)
Anion gap: 12 (ref 5–15)
BUN: 24 mg/dL — ABNORMAL HIGH (ref 8–23)
CO2: 23 mmol/L (ref 22–32)
Calcium: 8.6 mg/dL — ABNORMAL LOW (ref 8.9–10.3)
Chloride: 103 mmol/L (ref 98–111)
Creatinine, Ser: 1.11 mg/dL — ABNORMAL HIGH (ref 0.44–1.00)
GFR calc Af Amer: 54 mL/min — ABNORMAL LOW (ref >60)
GFR calc non Af Amer: 47 mL/min — ABNORMAL LOW (ref >60)
Glucose, Bld: 356 mg/dL — ABNORMAL HIGH (ref 70–99)
Potassium: 3.9 mmol/L (ref 3.5–5.1)
Sodium: 138 mmol/L (ref 135–145)
Total Bilirubin: 1.2 mg/dL (ref 0.3–1.2)
Total Protein: 7.2 g/dL (ref 6.5–8.1)

## 2019-04-12 LAB — CBC WITH DIFFERENTIAL/PLATELET
Abs Immature Granulocytes: 0.04 10*3/uL (ref 0.00–0.07)
Basophils Absolute: 0 10*3/uL (ref 0.0–0.1)
Basophils Relative: 0 %
Eosinophils Absolute: 0 10*3/uL (ref 0.0–0.5)
Eosinophils Relative: 0 %
HCT: 38.9 % (ref 36.0–46.0)
Hemoglobin: 12.2 g/dL (ref 12.0–15.0)
Immature Granulocytes: 1 %
Lymphocytes Relative: 25 %
Lymphs Abs: 1.5 10*3/uL (ref 0.7–4.0)
MCH: 31.6 pg (ref 26.0–34.0)
MCHC: 31.4 g/dL (ref 30.0–36.0)
MCV: 100.8 fL — ABNORMAL HIGH (ref 80.0–100.0)
Monocytes Absolute: 0.3 10*3/uL (ref 0.1–1.0)
Monocytes Relative: 6 %
Neutro Abs: 4.1 10*3/uL (ref 1.7–7.7)
Neutrophils Relative %: 68 %
Platelets: 294 10*3/uL (ref 150–400)
RBC: 3.86 MIL/uL — ABNORMAL LOW (ref 3.87–5.11)
RDW: 14.5 % (ref 11.5–15.5)
WBC: 6 10*3/uL (ref 4.0–10.5)
nRBC: 0 % (ref 0.0–0.2)

## 2019-04-12 LAB — C-REACTIVE PROTEIN: CRP: <0.8 mg/dL (ref <1.0)

## 2019-04-12 LAB — PROTIME-INR
INR: 2.4 — ABNORMAL HIGH (ref 0.8–1.2)
Prothrombin Time: 25.5 seconds — ABNORMAL HIGH (ref 11.4–15.2)

## 2019-04-12 LAB — HEMOGLOBIN A1C
Hgb A1c MFr Bld: 9.1 % — ABNORMAL HIGH (ref 4.8–5.6)
Mean Plasma Glucose: 214.47 mg/dL

## 2019-04-12 LAB — CBG MONITORING, ED: Glucose-Capillary: 351 mg/dL — ABNORMAL HIGH (ref 70–99)

## 2019-04-12 LAB — GLUCOSE, CAPILLARY: Glucose-Capillary: 318 mg/dL — ABNORMAL HIGH (ref 70–99)

## 2019-04-12 LAB — SEDIMENTATION RATE: Sed Rate: 75 mm/hr — ABNORMAL HIGH (ref 0–22)

## 2019-04-12 MED ORDER — VANCOMYCIN HCL 10 G IV SOLR
1500.0000 mg | Freq: Once | INTRAVENOUS | Status: AC
  Administered 2019-04-12: 1500 mg via INTRAVENOUS
  Filled 2019-04-12: qty 1500

## 2019-04-12 MED ORDER — INSULIN ASPART 100 UNIT/ML ~~LOC~~ SOLN
0.0000 [IU] | Freq: Three times a day (TID) | SUBCUTANEOUS | Status: DC
  Administered 2019-04-13: 3 [IU] via SUBCUTANEOUS
  Administered 2019-04-13: 5 [IU] via SUBCUTANEOUS
  Administered 2019-04-13: 8 [IU] via SUBCUTANEOUS
  Administered 2019-04-14 (×2): 3 [IU] via SUBCUTANEOUS

## 2019-04-12 MED ORDER — SENNOSIDES-DOCUSATE SODIUM 8.6-50 MG PO TABS: 1.0000 | ORAL_TABLET | Freq: Every evening | ORAL | Status: DC | PRN

## 2019-04-12 MED ORDER — GADOBUTROL 1 MMOL/ML IV SOLN
9.0000 mL | Freq: Once | INTRAVENOUS | Status: AC | PRN
  Administered 2019-04-12: 9 mL via INTRAVENOUS

## 2019-04-12 MED ORDER — MAGNESIUM CITRATE PO SOLN: 1.0000 | Freq: Once | ORAL | Status: DC | PRN

## 2019-04-12 MED ORDER — INSULIN GLARGINE 100 UNIT/ML ~~LOC~~ SOLN
15.0000 [IU] | Freq: Two times a day (BID) | SUBCUTANEOUS | Status: DC
  Administered 2019-04-12 – 2019-04-14 (×4): 15 [IU] via SUBCUTANEOUS
  Filled 2019-04-12 (×5): qty 0.15

## 2019-04-12 MED ORDER — INSULIN ASPART 100 UNIT/ML ~~LOC~~ SOLN
0.0000 [IU] | Freq: Every day | SUBCUTANEOUS | Status: DC
  Administered 2019-04-12: 4 [IU] via SUBCUTANEOUS

## 2019-04-12 MED ORDER — ONDANSETRON HCL 4 MG/2ML IJ SOLN: 4.0000 mg | Freq: Four times a day (QID) | INTRAMUSCULAR | Status: DC | PRN

## 2019-04-12 MED ORDER — TRAZODONE HCL 50 MG PO TABS
25.0000 mg | ORAL_TABLET | Freq: Every evening | ORAL | Status: DC | PRN
  Administered 2019-04-13: 25 mg via ORAL
  Filled 2019-04-12: qty 1

## 2019-04-12 MED ORDER — VANCOMYCIN HCL IN DEXTROSE 1-5 GM/200ML-% IV SOLN
1000.0000 mg | INTRAVENOUS | Status: DC
  Filled 2019-04-12: qty 200

## 2019-04-12 MED ORDER — SODIUM CHLORIDE 0.9 % IV SOLN
2.0000 g | Freq: Two times a day (BID) | INTRAVENOUS | Status: DC
  Administered 2019-04-12 – 2019-04-13 (×2): 2 g via INTRAVENOUS
  Filled 2019-04-12 (×2): qty 2

## 2019-04-12 MED ORDER — ONDANSETRON HCL 4 MG PO TABS: 4.0000 mg | ORAL_TABLET | Freq: Four times a day (QID) | ORAL | Status: DC | PRN

## 2019-04-12 MED ORDER — TRAMADOL HCL 50 MG PO TABS
50.0000 mg | ORAL_TABLET | Freq: Two times a day (BID) | ORAL | Status: DC | PRN
  Administered 2019-04-12 – 2019-04-14 (×3): 50 mg via ORAL
  Filled 2019-04-12 (×3): qty 1

## 2019-04-12 MED ORDER — LABETALOL HCL 5 MG/ML IV SOLN
10.0000 mg | INTRAVENOUS | Status: DC | PRN
  Administered 2019-04-13: 10 mg via INTRAVENOUS
  Filled 2019-04-12 (×2): qty 4

## 2019-04-12 MED ORDER — METRONIDAZOLE IN NACL 5-0.79 MG/ML-% IV SOLN
500.0000 mg | Freq: Three times a day (TID) | INTRAVENOUS | Status: DC
  Administered 2019-04-13: 500 mg via INTRAVENOUS
  Filled 2019-04-12: qty 100

## 2019-04-12 NOTE — ED Triage Notes (Signed)
Pt here sent by her MD to have her leg I and D from a fall from 3 weeks ago pt has a red swollen area to the right lower shin , pt is also on coumadin

## 2019-04-12 NOTE — ED Provider Notes (Signed)
[facility] EMERGENCY DEPARTMENT Provider Note   CSN: [id number] Arrival date & time: 04/12/19  1232     History   Chief Complaint Chief Complaint  Patient presents with  . Leg Injury    HPI Kaylee Mullins is a 83 y.o. female.  Presents emergency department with chief complaint of right leg injury, leg infection.  Reports initial injury was to her anterior right lower leg on August 6.  She went to her primary care doctor the following day after noting some redness, since completed a course of cephalexin but did not have an improvement in her symptoms.  Subsequently placed on course of doxycycline which she finished states that she has noticed a increase in the overall amount of swelling of her leg as well as significant erythema.  States that the erythema has not worsened recently.  Went to her primary doctor this morning who recommended outpatient come to the emergency department for further assessment.  She denies numbness, weakness, fever, systemic symptoms.     HPI  Past Medical History:  Diagnosis Date  . Acute CVA (cerebrovascular accident) ([place]) 09/16/2014  . Acute DVT of left tibial vein ([place]) 04/03/2017  . Adenocarcinoma ([place]) 01/12/2017  . Ampullary carcinoma ([place]) 10/27/2016  . Anxiety   . Arthritis    knees  . Black tarry stools    05-14-16 negative for occult blood with ER visit- noted in [place].  . Cancer [facility])    thyroid cancer- surgery and radiation  . Cerebral infarction due to unspecified mechanism   . Chronic kidney disease    questionable mass on kidney. Being followed by Dr Diona Fanti  . Complication of anesthesia    heart rate was really low  . CVA (cerebral infarction) 09/11/2014  . Depression   . Diabetes mellitus    type 2  . Diabetes mellitus without complication ([place]) [id number]   Qualifier: Diagnosis of  By: Loanne Drilling MD, Jacelyn Pi   . Dyslipidemia 04/20/2007   Qualifier: Diagnosis of  By: Loanne Drilling MD, Jacelyn Pi   . Full dentures   .  Gastroparesis 02/06/2017  . GERD (gastroesophageal reflux disease) 04/03/2017  . History of CVA (cerebral vascular accident) ([place]) 09/11/2014  . Hypertension   . Hypokalemia 08/25/2017  . Hypothyroidism   . Lumbar stenosis with neurogenic claudication 02/10/2016  . Osteoarthritis 09/11/2014  . Pneumonia   . Right renal mass 09/13/2014  . Spinal stenosis   . Stroke ([place]) 09/2014   left sided weakness  . SVT (supraventricular tachycardia) ([place]) 07/30/2017    Patient Active Problem List   Diagnosis Date Noted  . Hypokalemia 08/25/2017  . Spinal stenosis   . Pneumonia   . Full dentures   . Depression   . Complication of anesthesia   . Chronic kidney disease   . Cancer ([place])   . Black tarry stools   . Arthritis   . Anxiety   . Hypertension 08/03/2017  . Paroxysmal SVT (supraventricular tachycardia) ([place]) 08/03/2017  . History of CVA (cerebrovascular accident) 08/03/2017  . Hypothyroidism, adult 08/03/2017  . Chronic diastolic heart failure ([place]) 08/03/2017  . History of cancer of ampulla of duodenum 08/03/2017  . Left flank pain 08/03/2017  . Diabetes mellitus type 2, uncontrolled ([place])   . CKD stage 3 due to type 2 diabetes mellitus ([place])   . SVT (supraventricular tachycardia) ([place]) 07/30/2017  . Edema extremities 07/30/2017  . DVT, lower extremity, recurrent, bilateral ([place]) 07/30/2017  . On total parenteral nutrition (TPN) 04/08/2017  .  Hematoma of rectus sheath 04/03/2017  . GERD (gastroesophageal reflux disease) 04/03/2017  . Malnutrition of moderate degree 02/07/2017  . Gastroparesis 02/06/2017  . Adenocarcinoma ([place]) 01/12/2017  . Ampullary carcinoma ([place]) 10/27/2016  . Essential hypertension 02/11/2016  . Lumbar stenosis with neurogenic claudication 02/10/2016  . Hyperlipidemia 02/10/2016  . Hypothyroidism 02/10/2016  . Musculoskeletal neck pain 12/13/2014  . Muscular pain 12/13/2014  . Stroke ([place]) 09/17/2014  . Numbness   . Acute CVA (cerebrovascular accident)  ([place]) 09/16/2014  . Right renal mass 09/13/2014  . Cerebral infarction due to unspecified mechanism   . CVA (cerebral infarction) 09/11/2014  . Osteoarthritis 09/11/2014  . Postsurgical hypothyroidism 09/11/2014  . History of CVA (cerebral vascular accident) ([place]) 09/11/2014  . Diabetes mellitus without complication ([place]) [id number]  . Dyslipidemia 04/20/2007    Past Surgical History:  Procedure Laterality Date  . ABDOMINAL HYSTERECTOMY     partial  . cataracts     Removed  11/2015  bilateral  . COLONOSCOPY W/ POLYPECTOMY    . ERCP N/A 05/07/2016   Procedure: ENDOSCOPIC RETROGRADE CHOLANGIOPANCREATOGRAPHY (ERCP);  Surgeon: Clarene Essex, MD;  Location: [facility] ENDOSCOPY;  Service: Endoscopy;  Laterality: N/A;  . ERCP N/A 10/16/2016   Procedure: ENDOSCOPIC RETROGRADE CHOLANGIOPANCREATOGRAPHY (ERCP);  Surgeon: Clarene Essex, MD;  Location: [facility] ENDOSCOPY;  Service: Endoscopy;  Laterality: N/A;  . ESOPHAGOGASTRODUODENOSCOPY N/A 02/10/2017   Procedure: ESOPHAGOGASTRODUODENOSCOPY (EGD);  Surgeon: Teena Irani, MD;  Location: [facility] ENDOSCOPY;  Service: Endoscopy;  Laterality: N/A;  . EUS N/A 05/27/2016   Procedure: ESOPHAGEAL ENDOSCOPIC ULTRASOUND (EUS) RADIAL;  Surgeon: Arta Silence, MD;  Location: [facility] ENDOSCOPY;  Service: Endoscopy;  Laterality: N/A;  . FLEXIBLE SIGMOIDOSCOPY  03/29/2012   Procedure: FLEXIBLE SIGMOIDOSCOPY;  Surgeon: Jeryl Columbia, MD;  Location: [facility] ENDOSCOPY;  Service: Endoscopy;  Laterality: N/A;  fleet enema upon arrival  . HOT HEMOSTASIS  03/29/2012   Procedure: HOT HEMOSTASIS (ARGON PLASMA COAGULATION/BICAP);  Surgeon: Jeryl Columbia, MD;  Location: [facility] ENDOSCOPY;  Service: Endoscopy;  Laterality: N/A;  . IR GASTR TUBE CONVERT GASTR-JEJ PER W/FL MOD SED  02/23/2017  . IR GASTROSTOMY TUBE MOD SED  02/16/2017  . IR GENERIC HISTORICAL  06/30/2016   IR RADIOLOGIST EVAL & MGMT 06/30/2016 Aletta Edouard, MD [facility] INTERV RAD  . IR GENERIC HISTORICAL  09/09/2016   IR RADIOLOGIST EVAL & MGMT 09/09/2016  Aletta Edouard, MD [facility] INTERV RAD  . IR IVC FILTER PLMT / S&I /IMG GUID/MOD SED  03/03/2017  . IR PATIENT EVAL TECH 0-60 MINS  05/12/2017  . IR RADIOLOGIST EVAL & MGMT  11/24/2017  . IR RADIOLOGIST EVAL & MGMT  04/07/2018  . LAPAROSCOPY N/A 01/12/2017   Procedure: LAPAROSCOPY DIAGNOSTIC;  Surgeon: Stark Klein, MD;  Location: [facility];  Service: General;  Laterality: N/A;  . LAPAROTOMY N/A 03/10/2017   Procedure: EXPLORATORY LAPAROTOMY Open jejunostomy tube;  Surgeon: Stark Klein, MD;  Location: [facility];  Service: General;  Laterality: N/A;  . LUMBAR LAMINECTOMY/DECOMPRESSION MICRODISCECTOMY N/A 02/10/2016   Procedure: Lumbar three- four Laminectomy;  Surgeon: Kristeen Miss, MD;  Location: [facility] NEURO ORS;  Service: Neurosurgery;  Laterality: N/A;  L3-4 Laminectomy  . LUMBAR SPINE SURGERY     1st surgery "ray cage placed"  . THYROIDECTOMY  2005  . TONSILLECTOMY    . WHIPPLE PROCEDURE N/A 01/12/2017   Procedure: WHIPPLE PROCEDURE;  Surgeon: Stark Klein, MD;  Location: [facility];  Service: General;  Laterality: N/A;     OB History   No obstetric history on file.  Home Medications    Prior to Admission medications   Medication Sig Start Date End Date Taking? Authorizing Provider  ACCU-CHEK AVIVA PLUS test strip TEST 3 TIMES A DAY AS DIRECTED 12/12/17   [provider]  acetaminophen (TYLENOL) 500 MG tablet Take 500 mg by mouth every 6 (six) hours as needed for mild pain.    [provider]  B-D UF III MINI PEN NEEDLES 31G X 5 MM MISC USE AS DIRECTED WITH HUMALOG 04/28/17   [provider]  carvedilol (COREG) 25 MG tablet Take 1 tablet (25 mg total) by mouth 2 (two) times daily. 07/30/17 09/09/18  Sueanne Margarita, MD  furosemide (LASIX) 80 MG tablet Take as directed.  Take 80 mg daily alternating with 40 mg daily 11/22/17   Fay Records, MD  HUMALOG KWIKPEN 100 UNIT/ML KwikPen  06/30/18   [provider]  insulin detemir (LEVEMIR) 100 UNIT/ML injection Inject  0.07 mLs (7 Units total) into the skin 2 (two) times daily. Patient taking differently: Inject 30 Units into the skin daily. Pt is on a sliding scale 08/06/17   Patrecia Pour, Christean Grief, MD  irbesartan (AVAPRO) 300 MG tablet Take 1 tablet (300 mg total) by mouth daily. Patient not taking: Reported on 09/09/2018 02/04/17   Stark Klein, MD  LEVEMIR FLEXTOUCH 100 UNIT/ML Pen Inject 20 Units into the skin as needed. 10/02/17   [provider]  levothyroxine (SYNTHROID, LEVOTHROID) 200 MCG tablet Take 200 mcg by mouth daily before breakfast. For hypothyroidism 11/11/15   [provider]  losartan (COZAAR) 100 MG tablet Take 100 mg by mouth daily. 08/02/18   [provider]  NIFEdipine (PROCARDIA-XL/ADALAT-CC/NIFEDICAL-XL) 30 MG 24 hr tablet Take 30 mg by mouth daily. 07/04/17   [provider]  pantoprazole (PROTONIX) 40 MG tablet Take 1 tablet (40 mg total) by mouth at bedtime. 02/04/17   Stark Klein, MD  potassium chloride SA (KLOR-CON M20) 20 MEQ tablet Take 3 tablets daily alternating with 1 tablet daily.  Take with furosemide. 11/22/17   Fay Records, MD  rosuvastatin (CRESTOR) 20 MG tablet Take 20 mg by mouth daily. 10/31/17   [provider]  Vitamin D, Ergocalciferol, (DRISDOL) 50000 units CAPS capsule Take 50,000 Units by mouth once a week. 12/14/17   [provider]  warfarin (COUMADIN) 7.5 MG tablet Take 1 tablet (7.5 mg total) by mouth one time only at 6 PM. 08/06/17   Patrecia Pour, Christean Grief, MD    Family History Family History  Problem Relation Age of Onset  . Heart failure Mother   . Heart attack Father     Social History Social History   Tobacco Use  . Smoking status: Never Smoker  . Smokeless tobacco: Never Used  Substance Use Topics  . Alcohol use: No  . Drug use: Yes    Types: Methamphetamines     Allergies   Hydralazine hcl, Amlodipine besylate, Darvon [propoxyphene hcl], Nyquil multi-symptom [pseudoeph-doxylamine-dm-apap],  and Metformin and related   Review of Systems Review of Systems  Constitutional: Negative for chills and fever.  HENT: Negative for ear pain and sore throat.   Eyes: Negative for pain and visual disturbance.  Respiratory: Negative for cough and shortness of breath.   Cardiovascular: Negative for chest pain and palpitations.  Gastrointestinal: Negative for abdominal pain and vomiting.  Genitourinary: Negative for dysuria and hematuria.  Musculoskeletal: Positive for joint swelling. Negative for arthralgias and back pain.  Skin: Negative for color change and rash.  Neurological:  Negative for seizures and syncope.  All other systems reviewed and are negative.    Physical Exam Updated Vital Signs BP 135/67 (BP Location: Left Arm)   Pulse (!) 56   Temp 97.9 F (36.6 C) (Oral)   Resp 18   SpO2 98%   Physical Exam Vitals signs and nursing note reviewed.  Constitutional:      General: She is not in acute distress.    Appearance: She is well-developed.  HENT:     Head: Normocephalic and atraumatic.  Eyes:     Conjunctiva/sclera: Conjunctivae normal.  Neck:     Musculoskeletal: Neck supple.  Cardiovascular:     Rate and Rhythm: Normal rate and regular rhythm.     Heart sounds: No murmur.  Pulmonary:     Effort: Pulmonary effort is normal. No respiratory distress.     Breath sounds: Normal breath sounds.  Abdominal:     Palpations: Abdomen is soft.     Tenderness: There is no abdominal tenderness.  Musculoskeletal:     Comments: Right lower extremity: Over anterior lower leg 5 x 4 cm area swelling, mild induration, warm to touch, somewhat erythematous; no involvement of knee, ankle, normal knee and ankle range of motion  Skin:    General: Skin is warm and dry.  Neurological:     Mental Status: She is alert.      ED Treatments / Results  Labs (all labs ordered are listed, but only abnormal results are displayed) Labs Reviewed  PROTIME-INR - Abnormal; Notable for the  following components:      Result Value   Prothrombin Time 25.5 (*)    INR 2.4 (*)    All other components within normal limits  CBC WITH DIFFERENTIAL/PLATELET - Abnormal; Notable for the following components:   RBC 3.86 (*)    MCV 100.8 (*)    All other components within normal limits  COMPREHENSIVE METABOLIC PANEL - Abnormal; Notable for the following components:   Glucose, Bld 356 (*)    BUN 24 (*)    Creatinine, Ser 1.11 (*)    Calcium 8.6 (*)    Albumin 3.3 (*)    GFR calc non Af Amer 47 (*)    GFR calc Af Amer 54 (*)    All other components within normal limits  CBG MONITORING, ED - Abnormal; Notable for the following components:   Glucose-Capillary 351 (*)    All other components within normal limits    EKG None  Radiology Dg Tibia/fibula Right  Result Date: 04/12/2019 CLINICAL DATA:  Pain and swelling/hematoma in the anterior right lower leg, proximal to the ankle. Pain at the large bruise. No open wound. Pt states on August 6th, she was getting into a truck, had a back spasm and lost balance. EXAM: RIGHT TIBIA AND FIBULA - 2 VIEW COMPARISON:  None. FINDINGS: There is no evidence of fracture or other focal bone lesions. Degenerative changes are seen the knee. Atherosclerotic calcification. Soft tissue hematoma is visible in the lower anterior soft tissues of the calf measuring approximately 4.6 cm. IMPRESSION: Right lower leg soft tissue hematoma without underlying bony abnormality. Electronically Signed   By: Audie Pinto M.D.   On: 04/12/2019 15:40    Procedures Procedures (including critical care time)  Medications Ordered in ED Medications - No data to display   Initial Impression / Assessment and Plan / ED Course  I have reviewed the triage vital signs and the nursing notes.  Pertinent labs & imaging results  that were available during my care of the patient were reviewed by me and considered in my medical decision making (see chart for details).  Clinical  Course as of Apr 13 11  Wed Apr 12, 2019  1726 Discussed with hospitalist who will evaluate patient   [RD]    Clinical Course User Index [RD] Lucrezia Starch, MD      80-year-old lady who presented to the emergency department with concern for right leg infection worsening despite completing courses of Keflex, doxycycline.  On exam concern for cellulitis, question underlying induration.  Concern for deeper tissue involvement, extension to bone and other soft tissues, ordered MRI to further evaluate extent of disease.  Even if infection is only localized to skin, believe patient needs admission for IV antibiotics for failed outpatient therapy.  Consulted hospitalist who agrees to admit patient.  At time of admission, MRI pending.  Dr. Cyndia Skeeters accepting.  Final Clinical Impressions(s) / ED Diagnoses   Final diagnoses:  Cellulitis of right lower extremity  Type 2 diabetes mellitus with other skin complication, unspecified whether long term insulin use [facility])    ED Discharge Orders    None       Lucrezia Starch, MD 04/13/19 [phone]

## 2019-04-12 NOTE — H&P (Signed)
History and Physical    Kaylee Mullins [id number] DOB: March 28, 1936 DOA: 04/12/2019  PCP: Nolene Ebbs, MD Patient coming from: Home.  Uses cane and walker at baseline.  Lives alone.  Chief Complaint: Cellulitis  HPI: Kaylee Mullins is a 83 y.o. 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  Patient bumped her right leg against the fourth step of his son struck on her way to imaging center for follow-up imaging of a cancer on 03/23/2019.  She noticed swelling that has gotten worse the next day.  She went to PCP and was started on Keflex that she completed for 5 days.  She returned to PCP, and was started on doxycycline.  She completed doxycycline course for 7 days and return to PCP, as sent to ED for possible incision and drainage of underlying abscess.  Patient reports some improvement in the swelling but continued to feel warm.  She denies fever or chills. Denies URI symptoms, chest pain, dyspnea, cough, nausea, vomiting, abdominal pain, diarrhea, UTI symptoms or new focal neuro symptoms.  She states she has not had bowel movement in 2 days.  Lives alone.  Uses walker and cane at baseline.  Denies smoking cigarettes, drinking alcohol recreational drug use.  In ED, hemodynamically stable.  Afebrile.  CBC not impressive.  CBG 351.  Bicarb within normal range.  Creatinine 1.11 (baseline).  PT/INR 25.5/2.4 respectively.  RLE film read as soft tissue hematoma without underlying bony abnormality.  MRI ordered.  Hospitalist service was called for admission for cellulitis with underlying abscess due to failed outpatient treatment.  ROS All review of system negative except for pertinent positives and negatives as history of present illness above.  PMH Past Medical  History:  Diagnosis Date  . Acute CVA (cerebrovascular accident) ([place]) 09/16/2014  . Acute DVT of left tibial vein ([place]) 04/03/2017  . Adenocarcinoma ([place]) 01/12/2017  . 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 Anxiety   . Arthritis    knees  . Black tarry stools    05-14-16 negative for occult blood with ER visit- noted in [place].  . Cancer [facility])    thyroid cancer- surgery and radiation  . Cerebral infarction due to unspecified mechanism   . Chronic kidney disease    questionable mass on kidney. Being followed by Dr Diona Fanti  . Complication of anesthesia    heart rate was really low  . CVA (cerebral infarction) 09/11/2014  . Depression   . Diabetes mellitus    type 2  . Diabetes mellitus without complication ([place]) [id number]   Qualifier: Diagnosis of  By: Loanne Drilling MD, Jacelyn Pi   . Dyslipidemia 04/20/2007   Qualifier: Diagnosis of  By: Loanne Drilling MD, Jacelyn Pi   . Full dentures   . Gastroparesis 02/06/2017  . GERD (gastroesophageal reflux disease) 04/03/2017  . History of CVA (cerebral vascular accident) ([place]) 09/11/2014  . Hypertension   . Hypokalemia 08/25/2017  . Hypothyroidism   . Lumbar stenosis with neurogenic claudication 02/10/2016  . Osteoarthritis 09/11/2014  . Pneumonia   . Right renal mass 09/13/2014  . Spinal stenosis   . Stroke ([place]) 09/2014   left sided weakness  . SVT (supraventricular tachycardia) ([place]) 07/30/2017   PSH Past Surgical History:  Procedure Laterality Date  . ABDOMINAL HYSTERECTOMY     partial  . cataracts  Removed  11/2015  bilateral  . COLONOSCOPY W/ POLYPECTOMY    . ERCP N/A 05/07/2016   Procedure: ENDOSCOPIC RETROGRADE CHOLANGIOPANCREATOGRAPHY (ERCP);  Surgeon: Clarene Essex, MD;  Location: [facility] ENDOSCOPY;  Service: Endoscopy;  Laterality: N/A;  . ERCP N/A 10/16/2016   Procedure: ENDOSCOPIC RETROGRADE CHOLANGIOPANCREATOGRAPHY (ERCP);  Surgeon: Clarene Essex, MD;  Location: [facility] ENDOSCOPY;  Service: Endoscopy;  Laterality: N/A;  . ESOPHAGOGASTRODUODENOSCOPY N/A 02/10/2017    Procedure: ESOPHAGOGASTRODUODENOSCOPY (EGD);  Surgeon: Teena Irani, MD;  Location: [facility] ENDOSCOPY;  Service: Endoscopy;  Laterality: N/A;  . EUS N/A 05/27/2016   Procedure: ESOPHAGEAL ENDOSCOPIC ULTRASOUND (EUS) RADIAL;  Surgeon: Arta Silence, MD;  Location: [facility] ENDOSCOPY;  Service: Endoscopy;  Laterality: N/A;  . FLEXIBLE SIGMOIDOSCOPY  03/29/2012   Procedure: FLEXIBLE SIGMOIDOSCOPY;  Surgeon: Jeryl Columbia, MD;  [facility] END[facility]COPY;  Service: Endoscopy;  Laterality: N/A;  fleet enema upon arrival  . HOT HEMOSTASIS  03/29/2012   Procedure: HOT HEMOSTASIS (ARGON PLASMA COAGULATION/BICAP);  Surgeon: Jeryl Columbia, MD;  Location: [facility] ENDOSCOPY;  Service: Endoscopy;  Laterality: N/A;  . IR GASTR TUBE CONVERT GASTR-JEJ PER W/FL MOD SED  02/23/2017  . IR GASTROSTOMY TUBE MOD SED  02/16/2017  . IR GENERIC HISTORICAL  06/30/2016   IR RADIOLOGIST EVAL & MGMT 06/30/2016 Aletta Edouard, MD [facility] INTERV RAD  . IR GENERIC HISTORICAL  09/09/2016   IR RADIOLOGIST EVAL & MGMT 09/09/2016 Aletta Edouard, MD [facility] INTERV RAD  . IR IVC FILTER PLMT / S&I /IMG GUID/MOD SED  03/03/2017  . IR PATIENT EVAL TECH 0-60 MINS  05/12/2017  . IR RADIOLOGIST EVAL & MGMT  11/24/2017  . IR RADIOLOGIST EVAL & MGMT  04/07/2018  . LAPAROSCOPY N/A 01/12/2017   Procedure: LAPAROSCOPY DIAGNOSTIC;  Surgeon: Stark Klein, MD;  Location: [facility];  Service: General;  Laterality: N/A;  . LAPAROTOMY N/A 03/10/2017   Procedure: EXPLORATORY LAPAROTOMY Open jejunostomy tube;  Surgeon: Stark Klein, MD;  Location: [place];  Service: General;  Laterality: N/A;  . LUMBAR LAMINECTOMY/DECOMPRESSION MICRODISCECTOMY N/A 02/10/2016   Procedure: Lumbar three- four Laminectomy;  Surgeon: Kristeen Miss, MD;  Location: [facility] NEURO ORS;  Service: Neurosurgery;  Laterality: N/A;  L3-4 Laminectomy  . LUMBAR SPINE SURGERY     1st surgery "ray cage placed"  . THYROIDECTOMY  2005  . TONSILLECTOMY    . WHIPPLE PROCEDURE N/A 01/12/2017   Procedure: WHIPPLE PROCEDURE;   Surgeon: Stark Klein, MD;  Location: [facility] OR;  Service: General;  Laterality: N/A;   Fam HX Family History  Problem Relation Age of Onset  . Heart failure Mother   . Heart attack Father     Social Hx  reports that she has never smoked. She has never used smokeless tobacco. She reports current drug use. Drug: Methamphetamines. She reports that she does not drink alcohol.  Allergy Allergies  Allergen Reactions  . Hydralazine Hcl Shortness Of Breath    Pt had severe respiratory distress after receiving a dose  . Amlodipine Besylate Swelling  . Darvon [Propoxyphene Hcl] Other (See Comments)    Hallucinations   . Nyquil Multi-Symptom [Pseudoeph-Doxylamine-Dm-Apap] Other (See Comments)    Makes pt not "feel right in her head"  . Metformin And Related Diarrhea   Home Meds Prior to Admission medications   Medication Sig Start Date End Date Taking? Authorizing Provider  ACCU-CHEK AVIVA PLUS test strip TEST 3 TIMES A DAY AS DIRECTED 12/12/17   [provider]  acetaminophen (TYLENOL) 500 MG tablet Take 500 mg by mouth every 6 (  six) hours as needed for mild pain.    [provider]  B-D UF III MINI PEN NEEDLES 31G X 5 MM MISC USE AS DIRECTED WITH HUMALOG 04/28/17   [provider]  carvedilol (COREG) 25 MG tablet Take 1 tablet (25 mg total) by mouth 2 (two) times daily. 07/30/17 09/09/18  Sueanne Margarita, MD  furosemide (LASIX) 80 MG tablet Take as directed.  Take 80 mg daily alternating with 40 mg daily 11/22/17   Fay Records, MD  HUMALOG KWIKPEN 100 UNIT/ML KwikPen  06/30/18   [provider]  insulin detemir (LEVEMIR) 100 UNIT/ML injection Inject 0.07 mLs (7 Units total) into the skin 2 (two) times daily. Patient taking differently: Inject 30 Units into the skin daily. Pt is on a sliding scale 08/06/17   Patrecia Pour, Christean Grief, MD  irbesartan (AVAPRO) 300 MG tablet Take 1 tablet (300 mg total) by mouth daily. Patient not taking: Reported on 09/09/2018 02/04/17    Stark Klein, MD  LEVEMIR FLEXTOUCH 100 UNIT/ML Pen Inject 20 Units into the skin as needed. 10/02/17   [provider]  levothyroxine (SYNTHROID, LEVOTHROID) 200 MCG tablet Take 200 mcg by mouth daily before breakfast. For hypothyroidism 11/11/15   [provider]  losartan (COZAAR) 100 MG tablet Take 100 mg by mouth daily. 08/02/18   [provider]  NIFEdipine (PROCARDIA-XL/ADALAT-CC/NIFEDICAL-XL) 30 MG 24 hr tablet Take 30 mg by mouth daily. 07/04/17   [provider]  pantoprazole (PROTONIX) 40 MG tablet Take 1 tablet (40 mg total) by mouth at bedtime. 02/04/17   Stark Klein, MD  potassium chloride SA (KLOR-CON M20) 20 MEQ tablet Take 3 tablets daily alternating with 1 tablet daily.  Take with furosemide. 11/22/17   Fay Records, MD  rosuvastatin (CRESTOR) 20 MG tablet Take 20 mg by mouth daily. 10/31/17   [provider]  Vitamin D, Ergocalciferol, (DRISDOL) 50000 units CAPS capsule Take 50,000 Units by mouth once a week. 12/14/17   [provider]  warfarin (COUMADIN) 7.5 MG tablet Take 1 tablet (7.5 mg total) by mouth one time only at 6 PM. 08/06/17   Patrecia Pour, Christean Grief, MD    Physical Exam: Vitals:   04/12/19 1236 04/12/19 1440 04/12/19 1622  BP: (!) 145/61 (!) 109/53 135/67  Pulse: 62 (!) 56 (!) 56  Resp: 16 16 18   Temp: 98.5 F (36.9 C)  97.9 F (36.6 C)  TempSrc: Oral  Oral  SpO2: 100% 100% 98%    GENERAL: No acute distress.  Appears well.  HEENT: MMM.  Vision and hearing grossly intact.  NECK: Supple.  No apparent JVD.  RESP:  No IWOB. Good air movement bilaterally. CVS:  RRR. Heart sounds normal.  ABD/GI/GU: Bowel sounds present. Soft. Non tender.  MSK/EXT:  Moves extremities.  Erythematous swelling over distal half of right lower extremity anteriorly with underlying fluctuance warmth to touch.  See picture below. SKIN: As above NEURO: Awake, alert and oriented appropriately.  No gross deficit except for slight  weakness on the left. PSYCH: Calm. Normal affect.      Personally Reviewed Radiological Exams Dg Tibia/fibula Right  Result Date: 04/12/2019 CLINICAL DATA:  Pain and swelling/hematoma in the anterior right lower leg, proximal to the ankle. Pain at the large bruise. No open wound. Pt states on August 6th, she was getting into a truck, had a back spasm and lost balance. EXAM: RIGHT TIBIA AND FIBULA - 2 VIEW COMPARISON:  None. FINDINGS: There is no evidence of  fracture or other focal bone lesions. Degenerative changes are seen the knee. Atherosclerotic calcification. Soft tissue hematoma is visible in the lower anterior soft tissues of the calf measuring approximately 4.6 cm. IMPRESSION: Right lower leg soft tissue hematoma without underlying bony abnormality. Electronically Signed   By: Audie Pinto M.D.   On: 04/12/2019 15:40     Personally Reviewed Labs: CBC: Recent Labs  Lab 04/12/19 1325  WBC 6.0  NEUTROABS 4.1  HGB 12.2  HCT 38.9  MCV 100.8*  PLT [id number]   Basic Metabolic Panel: Recent Labs  Lab 04/12/19 1325  NA 138  K 3.9  CL 103  CO2 23  GLUCOSE 356*  BUN 24*  CREATININE 1.11*  CALCIUM 8.6*   GFR: CrCl cannot be calculated (Unknown ideal weight.). Liver Function Tests: Recent Labs  Lab 04/12/19 1325  AST 33  ALT 23  ALKPHOS 89  BILITOT 1.2  PROT 7.2  ALBUMIN 3.3*   No results for input(s): LIPASE, AMYLASE in the last 168 hours. No results for input(s): AMMONIA in the last 168 hours. Coagulation Profile: Recent Labs  Lab 04/12/19 1325  INR 2.4*   Cardiac Enzymes: No results for input(s): CKTOTAL, CKMB, CKMBINDEX, TROPONINI in the last 168 hours. BNP (last 3 results) Recent Labs    04/29/18 1404  PROBNP 516   HbA1C: No results for input(s): HGBA1C in the last 72 hours. CBG: Recent Labs  Lab 04/12/19 1423  GLUCAP 351*   Lipid Profile: No results for input(s): CHOL, HDL, LDLCALC, TRIG, CHOLHDL, LDLDIRECT in the last 72 hours. Thyroid  Function Tests: No results for input(s): TSH, T4TOTAL, FREET4, T3FREE, THYROIDAB in the last 72 hours. Anemia Panel: No results for input(s): VITAMINB12, FOLATE, FERRITIN, TIBC, IRON, RETICCTPCT in the last 72 hours. Urine analysis:    Component Value Date/Time   COLORURINE YELLOW 02/05/2017 2352   APPEARANCEUR HAZY (A) 02/05/2017 2352   LABSPEC 1.012 02/05/2017 2352   PHURINE 5.0 02/05/2017 2352   GLUCOSEU NEGATIVE 02/05/2017 2352   HGBUR NEGATIVE 02/05/2017 2352   BILIRUBINUR NEGATIVE 02/05/2017 2352   KETONESUR NEGATIVE 02/05/2017 2352   PROTEINUR NEGATIVE 02/05/2017 2352   UROBILINOGEN 0.2 09/16/2014 1317   NITRITE NEGATIVE 02/05/2017 2352   LEUKOCYTESUR NEGATIVE 02/05/2017 2352    Sepsis Labs:  No leukocytosis.  Personally Reviewed EKG:  None obtained this admission.  Assessment/Plan Right lower extremity cellulitis with possible underlying abscess in patient with history of diabetes -Traumatic injury after bumping her leg to truck 3 weeks ago -Failed outpatient treatment with Keflex and doxycycline for a total of 12 days -Swelling, induration or increased warmth to touch on exam. -X-ray reveals soft tissue hematoma but cannot exclude abscess. -Follow MRI of RLE -Consult orthopedic surgery based on results. -We will start cefepime, Flagyl and vancomycin empirically.  IDDM-2 with hyperglycemia and CKD-3: Not in DKA. -Start Lantus 15 units twice daily -SSI moderate -Check A1c -Continue statin.  Diastolic CHF: Echo in [id number] with EF of 60 to 65%, G1 DD.  Has no cardiopulmonary symptoms.  Appears euvolemic. -Resume home Lasix and other cardiac meds after med rec -Daily weight and intake and output.  Hypertension: Normotensive.  On multiple antihypertensive medications. -We will resume cardiac medications after med rec.  History of ampullary carcinoma status post Whipple procedure.  Followed by Dr. Learta Codding. -Outpatient follow-up.  History of DVT: On warfarin at  home.  INR therapeutic. -Hold home warfarin in case she requires surgery. -Heparin when INR below 2.  History of CVA with left-sided weakness:  Stable -Continue home medications.  Hypothyroidism: -Continue home Synthroid.  CKD 3: Stable -We will monitor as needed  History of depression/anxiety: Stable.  Does not seem to be medication.  GERD: -Continue home PPI.   DVT prophylaxis: INR therapeutic on warfarin. Code Status: Full code Family Communication: Attempted to call patient's daughter, Freda Munro but no answer.  No voicemail. Disposition Plan: Will admit to [place] called: None yet Admission status: Inpatient.  Patient failed outpatient therapy with 2 different antibiotics for cellulitis and possible abscess.  Significant comorbidities.  Lives alone.   Mercy Riding MD [facility]  If 7PM-7AM, please contact night-coverage [email or web address] Password [facility]  04/12/2019, 6:19 PM

## 2019-04-12 NOTE — ED Notes (Signed)
Pt states she feels as if her blood sugar is dropping.  CBG in 300's.

## 2019-04-12 NOTE — Progress Notes (Signed)
Received from the Emergency Room via stretcher.  The patient is being treated for cellulitis to the right lower leg.  Report received from the nurse in the ER, reports no antibiotics started and no pain medication given.  Right lower leg appears very swollen, skin in shiny and appears to have a large blistered area.  The patient is alert and oriented.

## 2019-04-12 NOTE — Progress Notes (Signed)
Pharmacy Antibiotic Note  Kaylee Mullins is a 83 y.o. female admitted on 04/12/2019 with cellulitis.  Pharmacy has been consulted for cefepime and vancomycin dosing.  Medical history includes: ampullary carcinoma, status post Whipple procedure and biliary stent; right RCC, status post ablation; IDDM-2; DVT on warfarin; CKD 3; HTN; diastolic HF; HTN; SVT; depression; anxiety; hypothyroidism; and CVA with residual left-sided weakness  Pt bumped R leg against step on 03/23/19 and developed swelling; her PCP prescribed 5-day course of Keflex, then 7-day course of doxycycline. Pt reports some improvement in swelling. Presents with RLE cellulitis with possible underlying abscess (diabetic patient).  WBC 6.0, afebrile, Scr 1.11, CrCl 42.9 ml/min (appears to be at baseline)  Plan: Cefepime 2 gm IV Q 12 hrs Vancomycin 1500 mg IV X 1, followed by vancomycin 1 gm IV Q 24 hrs (estimated vancomycin AUC on this regimen is 483; goal vancomycin AUC is 400-550) Monitor WBC, temp, clinical improvement, renal function  Pt also receiving metronidazole 500 mg IV Q 8 hrs  Height: 5\' 4"  (162.6 cm) Weight: 190 lb (86.2 kg) IBW/kg (Calculated) : 54.7  Temp (24hrs), Avg:98.2 F (36.8 C), Min:97.9 F (36.6 C), Max:98.5 F (36.9 C)  Recent Labs  Lab 04/12/19 1325  WBC 6.0  CREATININE 1.11*    Estimated Creatinine Clearance: 42.9 mL/min (A) (by C-G formula based on SCr of 1.11 mg/dL (H)).    Allergies  Allergen Reactions  . Hydralazine Hcl Shortness Of Breath    Pt had severe respiratory distress after receiving a dose  . Amlodipine Besylate Swelling  . Darvon [Propoxyphene Hcl] Other (See Comments)    Hallucinations   . Nyquil Multi-Symptom [Pseudoeph-Doxylamine-Dm-Apap] Other (See Comments)    Makes pt not "feel right in her head"  . Metformin And Related Diarrhea    Antimicrobials this admission: 8/26 metronidazole >>   Microbiology results: 8/26 BCx X 2: pending 7/22 MRSA PCR:  negative  Thank you for allowing pharmacy to be a part of this patient's care.  Gillermina Hu, PharmD, BCPS, [facility] Clinical Pharmacist 04/12/2019 7:28 PM

## 2019-04-12 NOTE — Progress Notes (Signed)
Lab at the bedside for lab work

## 2019-04-13 ENCOUNTER — Other Ambulatory Visit: Payer: Self-pay

## 2019-04-13 DIAGNOSIS — S8011XA Contusion of right lower leg, initial encounter: Secondary | ICD-10-CM

## 2019-04-13 DIAGNOSIS — L03115 Cellulitis of right lower limb: Secondary | ICD-10-CM

## 2019-04-13 LAB — BASIC METABOLIC PANEL
Anion gap: 9 (ref 5–15)
BUN: 24 mg/dL — ABNORMAL HIGH (ref 8–23)
CO2: 24 mmol/L (ref 22–32)
Calcium: 8.1 mg/dL — ABNORMAL LOW (ref 8.9–10.3)
Chloride: 107 mmol/L (ref 98–111)
Creatinine, Ser: 1.26 mg/dL — ABNORMAL HIGH (ref 0.44–1.00)
GFR calc Af Amer: 47 mL/min — ABNORMAL LOW (ref >60)
GFR calc non Af Amer: 40 mL/min — ABNORMAL LOW (ref >60)
Glucose, Bld: 291 mg/dL — ABNORMAL HIGH (ref 70–99)
Potassium: 3.5 mmol/L (ref 3.5–5.1)
Sodium: 140 mmol/L (ref 135–145)

## 2019-04-13 LAB — CBC
HCT: 32.3 % — ABNORMAL LOW (ref 36.0–46.0)
Hemoglobin: 10.4 g/dL — ABNORMAL LOW (ref 12.0–15.0)
MCH: 31.9 pg (ref 26.0–34.0)
MCHC: 32.2 g/dL (ref 30.0–36.0)
MCV: 99.1 fL (ref 80.0–100.0)
Platelets: 298 10*3/uL (ref 150–400)
RBC: 3.26 MIL/uL — ABNORMAL LOW (ref 3.87–5.11)
RDW: 14.4 % (ref 11.5–15.5)
WBC: 6.4 10*3/uL (ref 4.0–10.5)
nRBC: 0 % (ref 0.0–0.2)

## 2019-04-13 LAB — GLUCOSE, CAPILLARY
Glucose-Capillary: 251 mg/dL — ABNORMAL HIGH (ref 70–99)
Glucose-Capillary: 216 mg/dL — ABNORMAL HIGH (ref 70–99)
Glucose-Capillary: 186 mg/dL — ABNORMAL HIGH (ref 70–99)
Glucose-Capillary: 95 mg/dL (ref 70–99)

## 2019-04-13 LAB — PROTIME-INR
INR: 2.7 — ABNORMAL HIGH (ref 0.8–1.2)
Prothrombin Time: 28.1 seconds — ABNORMAL HIGH (ref 11.4–15.2)

## 2019-04-13 LAB — APTT: aPTT: 51 seconds — ABNORMAL HIGH (ref 24–36)

## 2019-04-13 MED ORDER — LEVOTHYROXINE SODIUM 100 MCG PO TABS
200.0000 ug | ORAL_TABLET | Freq: Every day | ORAL | Status: DC
  Administered 2019-04-13 – 2019-04-14 (×2): 200 ug via ORAL
  Filled 2019-04-13 (×2): qty 2

## 2019-04-13 MED ORDER — FUROSEMIDE 40 MG PO TABS
40.0000 mg | ORAL_TABLET | Freq: Every day | ORAL | Status: DC
  Administered 2019-04-13 – 2019-04-14 (×2): 40 mg via ORAL
  Filled 2019-04-13 (×2): qty 1

## 2019-04-13 MED ORDER — POTASSIUM CHLORIDE CRYS ER 20 MEQ PO TBCR
40.0000 meq | EXTENDED_RELEASE_TABLET | Freq: Every day | ORAL | Status: DC
  Administered 2019-04-13 – 2019-04-14 (×2): 40 meq via ORAL
  Filled 2019-04-13 (×2): qty 2

## 2019-04-13 MED ORDER — NIFEDIPINE ER OSMOTIC RELEASE 30 MG PO TB24
30.0000 mg | ORAL_TABLET | Freq: Every day | ORAL | Status: DC
  Administered 2019-04-14: 30 mg via ORAL
  Filled 2019-04-13 (×2): qty 1

## 2019-04-13 MED ORDER — LOSARTAN POTASSIUM 50 MG PO TABS
100.0000 mg | ORAL_TABLET | Freq: Every day | ORAL | Status: DC
  Administered 2019-04-13 – 2019-04-14 (×2): 100 mg via ORAL
  Filled 2019-04-13 (×2): qty 2

## 2019-04-13 MED ORDER — VITAMIN D (ERGOCALCIFEROL) 1.25 MG (50000 UNIT) PO CAPS
50000.0000 [IU] | ORAL_CAPSULE | ORAL | Status: DC
  Administered 2019-04-14: 50000 [IU] via ORAL
  Filled 2019-04-13: qty 1

## 2019-04-13 MED ORDER — CARVEDILOL 25 MG PO TABS
25.0000 mg | ORAL_TABLET | Freq: Two times a day (BID) | ORAL | Status: DC
  Administered 2019-04-13 – 2019-04-14 (×2): 25 mg via ORAL
  Filled 2019-04-13 (×3): qty 1

## 2019-04-13 MED ORDER — PANTOPRAZOLE SODIUM 40 MG PO TBEC
40.0000 mg | DELAYED_RELEASE_TABLET | Freq: Every day | ORAL | Status: DC
  Administered 2019-04-13: 40 mg via ORAL
  Filled 2019-04-13: qty 1

## 2019-04-13 NOTE — Consult Note (Signed)
Reason for Consult:Right leg hematoma Referring Physician: W Sarynity Burgeson is an 83 y.o. female.  HPI: Kaylee Mullins was getting into her son's truck and missed a step and hit the running board with her shin about 3 weeks ago. It swelled up and was very painful. She saw her PCP and was given 2 rounds of different oral abx for a presumed abscess. When she was not improved after second round he sent her to ED where she was admitted. She notes that the pain and swelling seem to be better to her over the past 2-3 days. It hurts mostly when she ambulates.  Past Medical History:  Diagnosis Date  . Acute CVA (cerebrovascular accident) ([place]) 09/16/2014  . Acute DVT of left tibial vein ([place]) 04/03/2017  . Adenocarcinoma ([place]) 01/12/2017  . 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 Anxiety   . Arthritis    knees  . Black tarry stools    05-14-16 negative for occult blood with ER visit- noted in [place].  . Cancer [facility])    thyroid cancer- surgery and radiation  . Cerebral infarction due to unspecified mechanism   . Chronic kidney disease    questionable mass on kidney. Being followed by Dr Diona Fanti  . Complication of anesthesia    heart rate was really low  . CVA (cerebral infarction) 09/11/2014  . Depression   . Diabetes mellitus    type 2  . Diabetes mellitus without complication ([place]) [id number]   Qualifier: Diagnosis of  By: Loanne Drilling MD, Jacelyn Pi   . Dyslipidemia 04/20/2007   Qualifier: Diagnosis of  By: Loanne Drilling MD, Jacelyn Pi   . Full dentures   . Gastroparesis 02/06/2017  . GERD (gastroesophageal reflux disease) 04/03/2017  . History of CVA (cerebral vascular accident) ([place]) 09/11/2014  . Hypertension   . Hypokalemia 08/25/2017  . Hypothyroidism   . Lumbar stenosis with neurogenic claudication 02/10/2016  . Osteoarthritis 09/11/2014  . Pneumonia   . Right renal mass 09/13/2014  . Spinal stenosis   . Stroke ([place]) 09/2014   left sided weakness  . SVT (supraventricular tachycardia) ([place]) 07/30/2017     Past Surgical History:  Procedure Laterality Date  . ABDOMINAL HYSTERECTOMY     partial  . cataracts     Removed  11/2015  bilateral  . COLONOSCOPY W/ POLYPECTOMY    . ERCP N/A 05/07/2016   Procedure: ENDOSCOPIC RETROGRADE CHOLANGIOPANCREATOGRAPHY (ERCP);  Surgeon: Clarene Essex, MD;  Location: [facility] ENDOSCOPY;  Service: Endoscopy;  Laterality: N/A;  . ERCP N/A 10/16/2016   Procedure: ENDOSCOPIC RETROGRADE CHOLANGIOPANCREATOGRAPHY (ERCP);  Surgeon: Clarene Essex, MD;  Location: [facility] ENDOSCOPY;  Service: Endoscopy;  Laterality: N/A;  . ESOPHAGOGASTRODUODENOSCOPY N/A 02/10/2017   Procedure: ESOPHAGOGASTRODUODENOSCOPY (EGD);  Surgeon: Teena Irani, MD;  Location: [facility] ENDOSCOPY;  Service: Endoscopy;  Laterality: N/A;  . EUS N/A 05/27/2016   Procedure: ESOPHAGEAL ENDOSCOPIC ULTRASOUND (EUS) RADIAL;  Surgeon: Arta Silence, MD;  Location: [facility] ENDOSCOPY;  Service: Endoscopy;  Laterality: N/A;  . FLEXIBLE SIGMOIDOSCOPY  03/29/2012   Procedure: FLEXIBLE SIGMOIDOSCOPY;  Surgeon: Jeryl Columbia, MD;  Location: [facility] ENDOSCOPY;  Service: Endoscopy;  Laterality: N/A;  fleet enema upon arrival  . HOT HEMOSTASIS  03/29/2012   Procedure: HOT HEMOSTASIS (ARGON PLASMA COAGULATION/BICAP);  Surgeon: Jeryl Columbia, MD;  Location: [facility] ENDOSCOPY;  Service: Endoscopy;  Laterality: N/A;  . IR GASTR TUBE CONVERT GASTR-JEJ PER W/FL MOD SED  02/23/2017  . IR GASTROSTOMY TUBE MOD SED  02/16/2017  . IR GENERIC HISTORICAL  06/30/2016  IR RADIOLOGIST EVAL & MGMT 06/30/2016 Aletta Edouard, MD [facility] INTERV RAD  . IR GENERIC HISTORICAL  09/09/2016   IR RADIOLOGIST EVAL & MGMT 09/09/2016 Aletta Edouard, MD [facility] INTERV RAD  . IR IVC FILTER PLMT / S&I /IMG GUID/MOD SED  03/03/2017  . IR PATIENT EVAL TECH 0-60 MINS  05/12/2017  . IR RADIOLOGIST EVAL & MGMT  11/24/2017  . IR RADIOLOGIST EVAL & MGMT  04/07/2018  . LAPAROSCOPY N/A 01/12/2017   Procedure: LAPAROSCOPY DIAGNOSTIC;  Surgeon: Stark Klein, MD;  Location: [place];  Service: General;   Laterality: N/A;  . LAPAROTOMY N/A 03/10/2017   Procedure: EXPLORATORY LAPAROTOMY Open jejunostomy tube;  Surgeon: Stark Klein, MD;  Location: [place];  Service: General;  Laterality: N/A;  . LUMBAR LAMINECTOMY/DECOMPRESSION MICRODISCECTOMY N/A 02/10/2016   Procedure: Lumbar three- four Laminectomy;  Surgeon: Kristeen Miss, MD;  Location: [facility] NEURO ORS;  Service: Neurosurgery;  Laterality: N/A;  L3-4 Laminectomy  . LUMBAR SPINE SURGERY     1st surgery "ray cage placed"  . THYROIDECTOMY  2005  . TONSILLECTOMY    . WHIPPLE PROCEDURE N/A 01/12/2017   Procedure: WHIPPLE PROCEDURE;  Surgeon: Stark Klein, MD;  Location: [facility] OR;  Service: General;  Laterality: N/A;    Family History  Problem Relation Age of Onset  . Heart failure Mother   . Heart attack Father     Social History:  reports that she has never smoked. She has never used smokeless tobacco. She reports current drug use. Drug: Methamphetamines. She reports that she does not drink alcohol.  Allergies:  Allergies  Allergen Reactions  . Hydralazine Hcl Shortness Of Breath    Pt had severe respiratory distress after receiving a dose  . Amlodipine Besylate Swelling  . Darvon [Propoxyphene Hcl] Other (See Comments)    Hallucinations   . Nyquil Multi-Symptom [Pseudoeph-Doxylamine-Dm-Apap] Other (See Comments)    Makes pt not "feel right in her head"  . Metformin And Related Diarrhea    Medications: I have reviewed the patient's current medications.  Results for orders placed or performed during the hospital encounter of 04/12/19 (from the past 48 hour(s))  Protime-INR     Status: Abnormal   Collection Time: 04/12/19  1:25 PM  Result Value Ref Range   Prothrombin Time 25.5 (H) 11.4 - 15.2 seconds   INR 2.4 (H) 0.8 - 1.2    Comment: (NOTE) INR goal varies based on device and disease states. Performed at [facility], [place] [address], [place], [place] [address]   CBC with Differential     Status: Abnormal   Collection  Time: 04/12/19  1:25 PM  Result Value Ref Range   WBC 6.0 4.0 - 10.5 K/uL   RBC 3.86 (L) 3.87 - 5.11 MIL/uL   Hemoglobin 12.2 12.0 - 15.0 g/dL   HCT 38.9 36.0 - 46.0 %   MCV 100.8 (H) 80.0 - 100.0 fL   MCH 31.6 26.0 - 34.0 pg   MCHC 31.4 30.0 - 36.0 g/dL   RDW 14.5 11.5 - 15.5 %   Platelets 294 150 - 400 K/uL   nRBC 0.0 0.0 - 0.2 %   Neutrophils Relative % 68 %   Neutro Abs 4.1 1.7 - 7.7 K/uL   Lymphocytes Relative 25 %   Lymphs Abs 1.5 0.7 - 4.0 K/uL   Monocytes Relative 6 %   Monocytes Absolute 0.3 0.1 - 1.0 K/uL   Eosinophils Relative 0 %   Eosinophils Absolute 0.0 0.0 - 0.5 K/uL  Basophils Relative 0 %   Basophils Absolute 0.0 0.0 - 0.1 K/uL   Immature Granulocytes 1 %   Abs Immature Granulocytes 0.04 0.00 - 0.07 K/uL    Comment: Performed at [facility], [place] [address] [place], [place] [address]  Comprehensive metabolic panel     Status: Abnormal   Collection Time: 04/12/19  1:25 PM  Result Value Ref Range   Sodium 138 135 - 145 mmol/L   Potassium 3.9 3.5 - 5.1 mmol/L   Chloride 103 98 - 111 mmol/L   CO2 23 22 - 32 mmol/L   Glucose, Bld 356 (H) 70 - 99 mg/dL   BUN 24 (H) 8 - 23 mg/dL   Creatinine, Ser 1.11 (H) 0.44 - 1.00 mg/dL   Calcium 8.6 (L) 8.9 - 10.3 mg/dL   Total Protein 7.2 6.5 - 8.1 g/dL   Albumin 3.3 (L) 3.5 - 5.0 g/dL   AST 33 15 - 41 U/L   ALT 23 0 - 44 U/L   Alkaline Phosphatase 89 38 - 126 U/L   Total Bilirubin 1.2 0.3 - 1.2 mg/dL   GFR calc non Af Amer 47 (L) >60 mL/min   GFR calc Af Amer 54 (L) >60 mL/min   Anion gap 12 5 - 15    Comment: Performed at [facility] [address] [place], [place] [address]  CBG monitoring, ED     Status: Abnormal   Collection Time: 04/12/19  2:23 PM  Result Value Ref Range   Glucose-Capillary 351 (H) 70 - 99 mg/dL  Blood culture (routine x 2)     Status: None (Preliminary result)   Collection Time: 04/12/19  6:59 PM   Specimen: BLOOD RIGHT HAND  Result Value Ref Range   Specimen Description  BLOOD RIGHT HAND    Special Requests      BOTTLES DRAWN AEROBIC AND ANAEROBIC Blood Culture adequate volume   Culture      NO GROWTH < 24 HOURS Performed at [facility], [place] [address] [place], [place] [address]    Report Status PENDING   Hemoglobin A1c     Status: Abnormal   Collection Time: 04/12/19  6:59 PM  Result Value Ref Range   Hgb A1c MFr Bld 9.1 (H) 4.8 - 5.6 %    Comment: (NOTE) Pre diabetes:          5.7%-6.4% Diabetes:              >6.4% Glycemic control for   <7.0% adults with diabetes    Mean Plasma Glucose 214.47 mg/dL    Comment: Performed at [facility] [address] [place], [place] [address]  C-reactive protein     Status: None   Collection Time: 04/12/19  7:00 PM  Result Value Ref Range   CRP <0.8 <1.0 mg/dL    Comment: Performed at [facility], [place] [address], [place], [place] [address]  Sedimentation rate     Status: Abnormal   Collection Time: 04/12/19  7:00 PM  Result Value Ref Range   Sed Rate 75 (H) 0 - 22 mm/hr    Comment: Performed at [facility] [address] [place], [place] [address]  Blood culture (routine x 2)     Status: None (Preliminary result)   Collection Time: 04/12/19  7:07 PM   Specimen: BLOOD  Result Value Ref Range   Specimen Description BLOOD LEFT ANTECUBITAL    Special Requests  BOTTLES DRAWN AEROBIC ONLY Blood Culture results may not be optimal due to an inadequate volume of blood received in culture bottles   Culture      NO GROWTH < 24 HOURS Performed at [facility] [address] [place], [place] [address]    Report Status PENDING   Glucose, capillary     Status: Abnormal   Collection Time: 04/12/19 10:43 PM  Result Value Ref Range   Glucose-Capillary 318 (H) 70 - 99 mg/dL  Basic metabolic panel     Status: Abnormal   Collection Time: 04/13/19  4:45 AM  Result Value Ref Range   Sodium 140 135 - 145 mmol/L   Potassium 3.5 3.5 - 5.1 mmol/L   Chloride 107 98 - 111 mmol/L    CO2 24 22 - 32 mmol/L   Glucose, Bld 291 (H) 70 - 99 mg/dL   BUN 24 (H) 8 - 23 mg/dL   Creatinine, Ser 1.26 (H) 0.44 - 1.00 mg/dL   Calcium 8.1 (L) 8.9 - 10.3 mg/dL   GFR calc non Af Amer 40 (L) >60 mL/min   GFR calc Af Amer 47 (L) >60 mL/min   Anion gap 9 5 - 15    Comment: Performed at [facility] [address], [place], [place] [address]  CBC     Status: Abnormal   Collection Time: 04/13/19  4:45 AM  Result Value Ref Range   WBC 6.4 4.0 - 10.5 K/uL   RBC 3.26 (L) 3.87 - 5.11 MIL/uL   Hemoglobin 10.4 (L) 12.0 - 15.0 g/dL   HCT 32.3 (L) 36.0 - 46.0 %   MCV 99.1 80.0 - 100.0 fL   MCH 31.9 26.0 - 34.0 pg   MCHC 32.2 30.0 - 36.0 g/dL   RDW 14.4 11.5 - 15.5 %   Platelets 298 150 - 400 K/uL   nRBC 0.0 0.0 - 0.2 %    Comment: Performed at [facility], [place] [address], [place], [place] [address]  Protime-INR     Status: Abnormal   Collection Time: 04/13/19  4:45 AM  Result Value Ref Range   Prothrombin Time 28.1 (H) 11.4 - 15.2 seconds   INR 2.7 (H) 0.8 - 1.2    Comment: (NOTE) INR goal varies based on device and disease states. Performed at [facility], [place] [address], [place], [place] [address]   APTT     Status: Abnormal   Collection Time: 04/13/19  4:45 AM  Result Value Ref Range   aPTT 51 (H) 24 - 36 seconds    Comment:        IF BASELINE aPTT IS ELEVATED, SUGGEST PATIENT RISK ASSESSMENT BE USED TO DETERMINE APPROPRIATE ANTICOAGULANT THERAPY. Performed at [facility], [place] [address] [place], [place] [address]   Glucose, capillary     Status: Abnormal   Collection Time: 04/13/19  6:58 AM  Result Value Ref Range   Glucose-Capillary 251 (H) 70 - 99 mg/dL  Glucose, capillary     Status: Abnormal   Collection Time: 04/13/19 12:44 PM  Result Value Ref Range   Glucose-Capillary 216 (H) 70 - 99 mg/dL    Dg Tibia/fibula Right  Result Date: 04/12/2019 CLINICAL DATA:  Pain and swelling/hematoma in the anterior right lower leg, proximal to  the ankle. Pain at the large bruise. No open wound. Pt states on August 6th, she was getting into a truck, had a back spasm and lost balance. EXAM: RIGHT  TIBIA AND FIBULA - 2 VIEW COMPARISON:  None. FINDINGS: There is no evidence of fracture or other focal bone lesions. Degenerative changes are seen the knee. Atherosclerotic calcification. Soft tissue hematoma is visible in the lower anterior soft tissues of the calf measuring approximately 4.6 cm. IMPRESSION: Right lower leg soft tissue hematoma without underlying bony abnormality. Electronically Signed   By: Audie Pinto M.D.   On: 04/12/2019 15:40   Mr Tibia Fibula Right W Wo Contrast  Result Date: 04/13/2019 CLINICAL DATA:  Anterior right lower leg pain, swelling, and bruising. Direct trauma to her right leg on 03/23/2019 while trying to get into a truck. EXAM: MRI OF LOWER RIGHT EXTREMITY WITHOUT AND WITH CONTRAST TECHNIQUE: Multiplanar, multisequence MR imaging of the right tibia and fibula was performed both before and after administration of intravenous contrast. CONTRAST:  9 mL Gadavist intravenous contrast. COMPARISON:  Right tibia and fibula x-rays from same day. FINDINGS: Bones/Joint/Cartilage No marrow signal abnormality. No fracture or dislocation. Normal alignment. No joint effusion. Bilateral knee osteoarthritis, advanced in the medial compartments. Muscles and Tendons Flexor, peroneal and extensor compartment tendons are intact. Faint edema and mild fatty infiltration of the lower leg muscles, nonspecific, but likely related to diabetic muscle changes. Soft tissue There is a 2.2 x 6.2 x 7.4 cm fluid collection in the subcutaneous fat of the anterior distal lower leg. This communicates with a smaller more superficial crescentic fluid collection just deep to the skin surface, measuring 1.0 x 4.3 x 3.0 cm. Both fluid collections demonstrate areas slightly decreased T2 hyperintensity and mildly increased T1 signal intensity compared to muscle.  The rim of both fluid collections is T1 hyperintense. The fluid collections are in close proximity to a prominent superficial vein (series 19, image 43) w. there is no significant surrounding soft tissue swelling. IMPRESSION: 1. There are two communicating fluid collections in the superficial subcutaneous fat of the anterior distal lower leg. The larger, deeper collection measures 2.2 x 6.2 x 7.4 cm, while the smaller more superficial collection just deep to the skin surface measures 1.0 x 4.3 x 3.0 cm. Both fluid collections demonstrate signal intensity consistent with hematomas, further supported by the lack of significant surrounding soft tissue swelling, close proximity to a prominent superficial vein, and the patient's elevated INR and lack of leukocytosis or fever. 2.  No acute osseous abnormality. Electronically Signed   By: Titus Dubin M.D.   On: 04/13/2019 07:50    Review of Systems  Constitutional: Negative for weight loss.  HENT: Negative for ear discharge, ear pain, hearing loss and tinnitus.   Eyes: Negative for blurred vision, double vision, photophobia and pain.  Respiratory: Negative for cough, sputum production and shortness of breath.   Cardiovascular: Negative for chest pain.  Gastrointestinal: Negative for abdominal pain, nausea and vomiting.  Genitourinary: Negative for dysuria, flank pain, frequency and urgency.  Musculoskeletal: Positive for myalgias (Right shin). Negative for back pain, falls, joint pain and neck pain.  Neurological: Negative for dizziness, tingling, sensory change, focal weakness, loss of consciousness and headaches.  Endo/Heme/Allergies: Does not bruise/bleed easily.  Psychiatric/Behavioral: Negative for depression, memory loss and substance abuse. The patient is not nervous/anxious.    Blood pressure (!) 183/76, pulse (!) 55, temperature 98.2 F (36.8 C), temperature source Oral, resp. rate 17, height 5\' 4"  (1.626 m), weight 86.2 kg, SpO2 100  %. Physical Exam  Constitutional: She appears well-developed and well-nourished. No distress.  HENT:  Head: Normocephalic and atraumatic.  Eyes: Conjunctivae are normal.  Right eye exhibits no discharge. Left eye exhibits no discharge. No scleral icterus.  Neck: Normal range of motion.  Cardiovascular: Normal rate and regular rhythm.  Respiratory: Effort normal. No respiratory distress.  Musculoskeletal:     Comments: RLE No traumatic wounds, ecchymosis, or rash  Fluctuant mass anterior shin with ecchymotic areas extending posteriorly and inferiorly, nontender, no warmth  No knee or ankle effusion  Knee stable to varus/ valgus and anterior/posterior stress  Sens DPN, SPN, TN intact  Motor EHL, ext, flex, evers 5/5  DP 2+, PT 1+, 1+ NP edema  Neurological: She is alert.  Skin: Skin is warm and dry. She is not diaphoretic.  Psychiatric: She has a normal mood and affect. Her behavior is normal.    Assessment/Plan: Right lower leg hematoma -- I do not think this represents an abscess or an infected hematoma, especially given no fevers or leukocytosis. I think this is just a hematoma and I think it's improving. She has some wrinkling of the skin which indicates the swelling is starting to subside. I did offer aspiration but given the possibility of converting this to an infected hematoma we both thought that was too risky. I recommended elevation and time. I did caution that there was a good chance that this will spontaneously drain or the overlying skin may slough but she says she already has an appointment with wound care center 9/1 so they'll be able to treat her if she develops a wound. If things change please reconsult but otherwise she can f/u with PCP. 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

## 2019-04-13 NOTE — Plan of Care (Addendum)
  Problem: Skin Integrity: Goal: Risk for impaired skin integrity will decrease Outcome: Progressing   Problem: Safety: Goal: Ability to remain free from injury will improve Outcome: Progressing  Per Dr. Rolm Baptise, R Calf Lower swelling area measures: 7 X 6.2 X 2.2cm. Pt may need a MSK reading, can be completed in the morning.

## 2019-04-13 NOTE — Progress Notes (Addendum)
PROGRESS NOTE    CARMENLITA MALLORY  [id number] DOB: [birth year] DOA: 04/12/2019 PCP: Nolene Ebbs, MD   Brief Narrative:  Kaylee Mullins is a 83 y.o. 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 Patient bumped her right leg against the fourth step of his son struck on her way to imaging center for follow-up imaging of a cancer on 03/23/2019.  She noticed swelling that has gotten worse the next day.  She went to PCP and was started on Keflex that she completed for 5 days.  She returned to PCP, and was started on doxycycline.  She completed doxycycline course for 7 days and return to PCP, as sent to ED for possible incision and drainage of underlying abscess. Patient reports some improvement in the swelling but continued to feel warm.  She denies fever or chills. Denies URI symptoms, chest pain, dyspnea, cough, nausea, vomiting, abdominal pain, diarrhea, UTI symptoms or new focal neuro symptoms. She states she has not had bowel movement in 2 days. Lives alone.  Uses walker and cane at baseline.  Denies smoking cigarettes, drinking alcohol recreational drug use. In ED, hemodynamically stable.  Afebrile.  CBC not impressive.  CBG 351.  Bicarb within normal range.  Creatinine 1.11 (baseline).  PT/INR 25.5/2.4 respectively.  RLE film read as soft tissue hematoma without underlying bony abnormality.  MRI ordered.  Hospitalist service was called for admission for cellulitis with underlying abscess due to failed outpatient treatment.   Assessment & Plan:   Principal Problem:   Traumatic hematoma of right lower leg Active Problems:   Right renal mass   Cellulitis   Right lower extremity hematoma, rule out cellulitis/abscess with concurrent fluid collection -Abscess less likely  in favor of hematoma per MRI -Traumatic injury after bumping her leg to truck 3 weeks ago -Failed outpatient treatment with Keflex and doxycycline for a total of 12 days -Swelling, induration or increased warmth to touch on exam. -X-ray reveals soft tissue hematoma but cannot exclude abscess. -MRI shows two separate fluid collections again favoring hematoma>abscess -Discontinue all abx and follow clinically  IDDM-2 with hyperglycemia and CKD-3: Not in DKA. -Start Lantus 15 units twice daily -SSI moderate -A1c - 9.1 -Continue statin.  Diastolic CHF: Echo in [id number] with EF of 60 to 65%, G1 DD.   -No cardiopulmonary symptoms.  Appears euvolemic. -Resume home Lasix and other cardiac meds -Daily weight and intake and output.  Hypertension, essential, well controleld:  -Currently normotensive.  On multiple antihypertensive medications. -We will resume cardiac medications after med rec.  History of ampullary carcinoma status post Whipple procedure.   -Followed by Dr. Learta Codding. -Outpatient follow-up.  History of DVT: On warfarin at home.  -INR therapeutic. -Hold home warfarin given likely hematoma as above, possible procedure required  History of CVA with left-sided weakness: Stable -Continue home medications.  Hypothyroidism: -Continue home Synthroid.  CKD 3: Stable -We will monitor as needed  History of depression/anxiety:  Stable.  Does not seem to be medication.  GERD: -Continue home PPI.   DVT prophylaxis: INR therapeutic on warfarin. Code Status: Full code Disposition Plan: Will admit to [facility] called: Orthopedics sidelined  Admission status: Inpatient. Patient continues to require close monitoring and possible intervention/procedure in the inpatient setting  Subjective: No acute issues or events overnight, tolerating p.o. well, right  lower extremity pain ongoing but somewhat improved current regimen.  Otherwise declines chest pain, shortness of  breath, nausea, vomiting, diarrhea, constipation, headache, fevers, chills.  Objective: Vitals:   04/12/19 1900 04/12/19 2034 04/13/19 0452 04/13/19 0818  BP:  (!) 157/58 (!) 169/65 (!) 171/67  Pulse:  (!) 54 (!) 53 (!) 51  Resp:  18 20 16   Temp:  98 F (36.7 C) 98.1 F (36.7 C) 97.6 F (36.4 C)  TempSrc:  Oral Oral Oral  SpO2:  100% 99% 98%  Weight: 86.2 kg     Height: 5\' 4"  (1.626 m)       Intake/Output Summary (Last 24 hours) at 04/13/2019 1336 Last data filed at 04/13/2019 0900 Gross per 24 hour  Intake 1560 ml  Output 2 ml  Net 1558 ml   Filed Weights   04/12/19 1900  Weight: 86.2 kg    Examination:  General:  Pleasantly resting in bed, No acute distress. HEENT:  Normocephalic atraumatic.  Sclerae nonicteric, noninjected.  Extraocular movements intact bilaterally. Neck:  Without mass or deformity.  Trachea is midline. Lungs:  Clear to auscultate bilaterally without rhonchi, wheeze, or rales. Heart:  Regular rate and rhythm.  Without murmurs, rubs, or gallops. Abdomen:  Soft, nontender, nondistended.  Without guarding or rebound. Extremities: Large fluctuant warm mass over distal right lower extremity anteriorly, markedly tender to palpation. Vascular:  Dorsalis pedis and posterior tibial pulses palpable bilaterally. Skin: Warm fluctuant mass as below, otherwise. Warm and dry, no erythema, no ulcerations.      Data Reviewed: I have personally reviewed following labs and imaging studies  CBC: Recent Labs  Lab 04/12/19 1325 04/13/19 0445  WBC 6.0 6.4  NEUTROABS 4.1  --   HGB 12.2 10.4*  HCT 38.9 32.3*  MCV 100.8* 99.1  PLT 294 [id number]   Basic Metabolic Panel: Recent Labs  Lab 04/12/19 1325 04/13/19 0445  NA 138 140  K 3.9 3.5  CL 103 107  CO2 23 24  GLUCOSE 356* 291*  BUN 24* 24*  CREATININE 1.11* 1.26*  CALCIUM 8.6* 8.1*   GFR: Estimated Creatinine Clearance: 37.8 mL/min (A) (by C-G formula based on SCr of 1.26 mg/dL (H)). Liver Function  Tests: Recent Labs  Lab 04/12/19 1325  AST 33  ALT 23  ALKPHOS 89  BILITOT 1.2  PROT 7.2  ALBUMIN 3.3*   No results for input(s): LIPASE, AMYLASE in the last 168 hours. No results for input(s): AMMONIA in the last 168 hours. Coagulation Profile: Recent Labs  Lab 04/12/19 1325 04/13/19 0445  INR 2.4* 2.7*   Cardiac Enzymes: No results for input(s): CKTOTAL, CKMB, CKMBINDEX, TROPONINI in the last 168 hours. BNP (last 3 results) Recent Labs    04/29/18 1404  PROBNP 516   HbA1C: Recent Labs    04/12/19 1859  HGBA1C 9.1*   CBG: Recent Labs  Lab 04/12/19 1423 04/12/19 2243 04/13/19 0658 04/13/19 1244  GLUCAP 351* 318* 251* 216*   Lipid Profile: No results for input(s): CHOL, HDL, LDLCALC, TRIG, CHOLHDL, LDLDIRECT in the last 72 hours. Thyroid Function Tests: No results for input(s): TSH, T4TOTAL, FREET4, T3FREE, THYROIDAB in the last 72 hours. Anemia Panel: No results for input(s): VITAMINB12, FOLATE, FERRITIN, TIBC, IRON, RETICCTPCT in the last 72 hours. Sepsis Labs: No results for input(s): PROCALCITON, LATICACIDVEN in the last 168 hours.  Recent Results (from the past 240 hour(s))  Blood culture (routine x 2)     Status: None (Preliminary result)   Collection Time: 04/12/19  6:59  PM   Specimen: BLOOD RIGHT HAND  Result Value Ref Range Status   Specimen Description BLOOD RIGHT HAND  Final   Special Requests   Final    BOTTLES DRAWN AEROBIC AND ANAEROBIC Blood Culture adequate volume   Culture   Final    NO GROWTH < 24 HOURS Performed at [facility], [address] [place], [place] [address]    Report Status PENDING  Incomplete  Blood culture (routine x 2)     Status: None (Preliminary result)   Collection Time: 04/12/19  7:07 PM   Specimen: BLOOD  Result Value Ref Range Status   Specimen Description BLOOD LEFT ANTECUBITAL  Final   Special Requests   Final    BOTTLES DRAWN AEROBIC ONLY Blood Culture results may not be optimal due to an  inadequate volume of blood received in culture bottles   Culture   Final    NO GROWTH < 24 HOURS Performed at [facility], [place] [address], [place], [place] [address]    Report Status PENDING  Incomplete         Radiology Studies: Dg Tibia/fibula Right  Result Date: 04/12/2019 CLINICAL DATA:  Pain and swelling/hematoma in the anterior right lower leg, proximal to the ankle. Pain at the large bruise. No open wound. Pt states on August 6th, she was getting into a truck, had a back spasm and lost balance. EXAM: RIGHT TIBIA AND FIBULA - 2 VIEW COMPARISON:  None. FINDINGS: There is no evidence of fracture or other focal bone lesions. Degenerative changes are seen the knee. Atherosclerotic calcification. Soft tissue hematoma is visible in the lower anterior soft tissues of the calf measuring approximately 4.6 cm. IMPRESSION: Right lower leg soft tissue hematoma without underlying bony abnormality. Electronically Signed   By: Audie Pinto M.D.   On: 04/12/2019 15:40   Mr Tibia Fibula Right W Wo Contrast  Result Date: 04/13/2019 CLINICAL DATA:  Anterior right lower leg pain, swelling, and bruising. Direct trauma to her right leg on 03/23/2019 while trying to get into a truck. EXAM: MRI OF LOWER RIGHT EXTREMITY WITHOUT AND WITH CONTRAST TECHNIQUE: Multiplanar, multisequence MR imaging of the right tibia and fibula was performed both before and after administration of intravenous contrast. CONTRAST:  9 mL Gadavist intravenous contrast. COMPARISON:  Right tibia and fibula x-rays from same day. FINDINGS: Bones/Joint/Cartilage No marrow signal abnormality. No fracture or dislocation. Normal alignment. No joint effusion. Bilateral knee osteoarthritis, advanced in the medial compartments. Muscles and Tendons Flexor, peroneal and extensor compartment tendons are intact. Faint edema and mild fatty infiltration of the lower leg muscles, nonspecific, but likely related to diabetic muscle changes. Soft  tissue There is a 2.2 x 6.2 x 7.4 cm fluid collection in the subcutaneous fat of the anterior distal lower leg. This communicates with a smaller more superficial crescentic fluid collection just deep to the skin surface, measuring 1.0 x 4.3 x 3.0 cm. Both fluid collections demonstrate areas slightly decreased T2 hyperintensity and mildly increased T1 signal intensity compared to muscle. The rim of both fluid collections is T1 hyperintense. The fluid collections are in close proximity to a prominent superficial vein (series 19, image 43) w. there is no significant surrounding soft tissue swelling. IMPRESSION: 1. There are two communicating fluid collections in the superficial subcutaneous fat of the anterior distal lower leg. The larger, deeper collection measures 2.2 x 6.2 x 7.4 cm, while the smaller more superficial collection just deep to the skin surface measures  1.0 x 4.3 x 3.0 cm. Both fluid collections demonstrate signal intensity consistent with hematomas, further supported by the lack of significant surrounding soft tissue swelling, close proximity to a prominent superficial vein, and the patient's elevated INR and lack of leukocytosis or fever. 2.  No acute osseous abnormality. Electronically Signed   By: Titus Dubin M.D.   On: 04/13/2019 07:50        Scheduled Meds: . carvedilol  25 mg Oral BID  . furosemide  40 mg Oral Daily  . insulin aspart  0-15 Units Subcutaneous TID WC  . insulin aspart  0-5 Units Subcutaneous QHS  . insulin glargine  15 Units Subcutaneous BID  . levothyroxine  200 mcg Oral Q0600  . losartan  100 mg Oral Daily  . NIFEdipine  30 mg Oral Daily  . pantoprazole  40 mg Oral QHS  . potassium chloride SA  40 mEq Oral Daily  . [START ON 04/14/2019] Vitamin D (Ergocalciferol)  50,000 Units Oral Q Fri   Continuous Infusions:    LOS: 1 day   Time spent: 45min  Audreyanna Butkiewicz C Tylisa Alcivar, DO [facility]  If 7PM-7AM, please contact  night-coverage [email or web address] Password [email or web address] 04/13/2019, 1:36 PM

## 2019-04-14 DIAGNOSIS — S8011XA Contusion of right lower leg, initial encounter: Principal | ICD-10-CM

## 2019-04-14 LAB — BASIC METABOLIC PANEL
Anion gap: 9 (ref 5–15)
BUN: 18 mg/dL (ref 8–23)
CO2: 25 mmol/L (ref 22–32)
Calcium: 8.2 mg/dL — ABNORMAL LOW (ref 8.9–10.3)
Chloride: 105 mmol/L (ref 98–111)
Creatinine, Ser: 1.02 mg/dL — ABNORMAL HIGH (ref 0.44–1.00)
GFR calc Af Amer: >60 mL/min (ref >60)
GFR calc non Af Amer: 52 mL/min — ABNORMAL LOW (ref >60)
Glucose, Bld: 162 mg/dL — ABNORMAL HIGH (ref 70–99)
Potassium: 3.8 mmol/L (ref 3.5–5.1)
Sodium: 139 mmol/L (ref 135–145)

## 2019-04-14 LAB — GLUCOSE, CAPILLARY
Glucose-Capillary: 172 mg/dL — ABNORMAL HIGH (ref 70–99)
Glucose-Capillary: 174 mg/dL — ABNORMAL HIGH (ref 70–99)

## 2019-04-14 LAB — PROTIME-INR
INR: 2.4 — ABNORMAL HIGH (ref 0.8–1.2)
Prothrombin Time: 26 seconds — ABNORMAL HIGH (ref 11.4–15.2)

## 2019-04-14 NOTE — Progress Notes (Signed)
RN gave patient discharge instructions and answered all of pt questions. No new medications for pt, belongings at bedside, IV has been removed. Pt waiting for daughter for pick up.

## 2019-04-14 NOTE — Discharge Summary (Signed)
Physician Discharge Summary  Kaylee Mullins MRN:[id number] DOB: 02/09/1936 DOA: 04/12/2019  PCP: Avbuere, Edwin, MD  Admit date: 04/12/2019 Discharge date: 04/14/2019  Admitted From: Home Disposition: Home  Recommendations for Outpatient Follow-up:  1. Follow up with PCP in 1-2 weeks, wound care on 04/18/2019 2. 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 Patient bumped her right leg against the fourth step of his son struck on her way to imaging center for follow-up imaging of a cancer on 03/23/2019. She noticed swelling that has gotten worse the next day. She went to PCP and was started on Keflex that she completed for 5 days. She returned to PCP,and was started on doxycycline.She completed doxycycline course for7days and return to PCP,as sent to ED for possible incision and drainage of underlying abscess. Patient reports some improvement in the swelling but continued to feel warm.She denies fever or chills. Denies URI symptoms, chest pain, dyspnea, cough, nausea, vomiting, abdominal pain, diarrhea, UTI symptoms or new focal neuro symptoms. She states she has not had bowel movement in 2 days. Lives alone. Uses walker and cane at baseline. Denies smoking cigarettes, drinking alcohol recreational drug use. In ED, hemodynamically stable. Afebrile. CBC not impressive. CBG 351. Bicarb within normal range. Creatinine 1.11 (baseline). PT/INR 25.5/2.4 respectively. RLEfilm read assoft tissue hematoma without underlying bony abnormality.MRI ordered. Hospitalist service was called for admission  for cellulitis with underlying abscess due to failed outpatient treatment.  Patient met as above with concern over anterior right lower extremity wound.  After further imaging and evaluation this appears to be more consistent with hematoma, patient remains afebrile without leukocytosis, off all antibiotics and doing quite well.  Further evaluation with orthopedic surgery, appreciate insight recommendations, currently recommending against evacuation given concern for introducing infection.  Patient is able to ambulate at her baseline, pain is improving, otherwise stable and agreeable for discharge home, patient does have scheduled follow-up on 04/18/2019 with wound care.  Orthopedics indicates this will likely resolve on its own over the next few weeks.  Patient can follow-up with wound care and PCP as above.  Unlikely need for further outpatient orthopedic work-up at this time.  Discharge Diagnoses:  Principal Problem:   Traumatic hematoma of right lower leg Active Problems:   Right renal mass   Cellulitis   Discharge Instructions  Discharge Instructions    Diet - low sodium heart healthy   Complete by: As directed    Increase activity slowly   Complete by: As directed      Allergies as of 04/14/2019      Reactions   Hydralazine Hcl Shortness Of Breath   Pt had severe respiratory distress after receiving a dose   Amlodipine Besylate Swelling   Darvon [propoxyphene Hcl] Other (See Comments)   Hallucinations   Nyquil Multi-symptom [pseudoeph-doxylamine-dm-apap] Other (See Comments)   Makes pt not "feel right in her head"   Metformin And Related Diarrhea      Medication List    TAKE these medications   acetaminophen 500 MG tablet Commonly known as: TYLENOL Take 500 mg by mouth every 6 (six) hours as needed for mild pain.   carvedilol 25 MG tablet Commonly known   as: COREG Take 1 tablet (25 mg total) by mouth 2 (two) times daily.   furosemide 80 MG tablet Commonly known as:  LASIX Take as directed.  Take 80 mg daily alternating with 40 mg daily What changed:   how much to take  how to take this  when to take this  additional instructions   HumaLOG KwikPen 100 UNIT/ML KwikPen Generic drug: insulin lispro Inject 7-10 Units into the skin See admin instructions. Three times daily per sliding scale   insulin detemir 100 UNIT/ML injection Commonly known as: LEVEMIR Inject 0.07 mLs (7 Units total) into the skin 2 (two) times daily. What changed:   how much to take  when to take this   levothyroxine 200 MCG tablet Commonly known as: SYNTHROID Take 200 mcg by mouth daily before breakfast. For hypothyroidism   losartan 100 MG tablet Commonly known as: COZAAR Take 100 mg by mouth daily.   NIFEdipine 30 MG 24 hr tablet Commonly known as: PROCARDIA-XL/NIFEDICAL-XL Take 30 mg by mouth daily.   pantoprazole 40 MG tablet Commonly known as: PROTONIX Take 1 tablet (40 mg total) by mouth at bedtime.   potassium chloride SA 20 MEQ tablet Commonly known as: Klor-Con M20 Take 3 tablets daily alternating with 1 tablet daily.  Take with furosemide. What changed:   how much to take  how to take this  when to take this  additional instructions   Vitamin D (Ergocalciferol) 1.25 MG (50000 UT) Caps capsule Commonly known as: DRISDOL Take 50,000 Units by mouth once a week.   warfarin 7.5 MG tablet Commonly known as: COUMADIN Take 1 tablet (7.5 mg total) by mouth one time only at 6 PM.       Allergies  Allergen Reactions  . Hydralazine Hcl Shortness Of Breath    Pt had severe respiratory distress after receiving a dose  . Amlodipine Besylate Swelling  . Darvon [Propoxyphene Hcl] Other (See Comments)    Hallucinations   . Nyquil Multi-Symptom [Pseudoeph-Doxylamine-Dm-Apap] Other (See Comments)    Makes pt not "feel right in her head"  . Metformin And Related Diarrhea    Consultations:  Orthopedic Sx; Jeffery PA/Rogers  MD  Procedures/Studies: Dg Tibia/fibula Right  Result Date: 04/12/2019 CLINICAL DATA:  Pain and swelling/hematoma in the anterior right lower leg, proximal to the ankle. Pain at the large bruise. No open wound. Pt states on August 6th, she was getting into a truck, had a back spasm and lost balance. EXAM: RIGHT TIBIA AND FIBULA - 2 VIEW COMPARISON:  None. FINDINGS: There is no evidence of fracture or other focal bone lesions. Degenerative changes are seen the knee. Atherosclerotic calcification. Soft tissue hematoma is visible in the lower anterior soft tissues of the calf measuring approximately 4.6 cm. IMPRESSION: Right lower leg soft tissue hematoma without underlying bony abnormality. Electronically Signed   By: Nancy  Ballantyne M.D.   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 EXAM: CT CHEST, ABDOMEN, AND PELVIS WITH CONTRAST TECHNIQUE: Multidetector CT imaging of the chest, abdomen and pelvis was performed following the standard protocol during bolus administration of intravenous contrast. CONTRAST:  100mL ISOVUE-300 IOPAMIDOL (ISOVUE-300) INJECTION 61% COMPARISON:  CT the abdomen and pelvis 09/16/2018. CTA chest 07/30/2017. FINDINGS: CT CHEST FINDINGS Cardiovascular: Heart size is mildly enlarged. There is no significant pericardial fluid, thickening or pericardial calcification. There is aortic atherosclerosis, as well as atherosclerosis of   the great vessels of the mediastinum and the coronary arteries, including calcified atherosclerotic plaque in the left main, left anterior descending, left circumflex and right coronary arteries. Thickening calcification of the aortic valve. Mediastinum/Nodes: No pathologically enlarged mediastinal or hilar lymph nodes. Esophagus is unremarkable in appearance. No axillary lymphadenopathy. Lungs/Pleura: Chronic elevation of the left  hemidiaphragm with scarring in the left lung base, similar to the prior study. A few scattered pulmonary nodules appear stable compared to the prior examination in size and number, largest of which measures 4 mm in the right middle lobe (axial image 68 of series 4). No other larger more suspicious appearing pulmonary nodules or masses are noted. No acute consolidative airspace disease. No pleural effusions. Musculoskeletal: Heterogeneous areas of mild diffuse sclerosis again noted throughout the visualized axial and appendicular skeleton, similar to prior examinations. 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 CT ABDOMEN PELVIS FINDINGS Hepatobiliary: Liver has a slightly shrunken appearance and nodular appearance, compatible with mild cirrhosis. No suspicious cystic or solid hepatic lesions. No intra or extrahepatic biliary ductal dilatation. Trace amount of pneumobilia in the left intrahepatic bile ducts. Status post cholecystectomy. Pancreas: Postoperative changes of Whipple procedure. Stent in the pancreatic duct. No suspicious pancreatic mass noted in the residual body and tail of the pancreas. No pancreatic ductal dilatation. No peripancreatic fluid collections or inflammatory changes noted. Spleen: Unremarkable. Adrenals/Urinary Tract: Exophytic intermediate attenuation lesion in the upper pole of the right kidney which appears distorted, similar to prior examinations measuring 1.6 x 1.0 cm, favored to represent an involuting cyst. Left kidney and bilateral adrenal glands are normal in appearance. No hydroureteronephrosis. Urinary bladder is unremarkable in appearance. Stomach/Bowel: Postoperative changes of Whipple procedure. No pathologic dilatation of small bowel or colon. A few scattered colonic diverticulae are noted, without surrounding inflammatory changes to suggest an acute diverticulitis at this  time. Normal appendix. Vascular/Lymphatic: Aortic atherosclerosis, without evidence of aneurysm or dissection in the abdominal or pelvic vasculature. IVC filter in position with tip terminating below the level of the renal veins. Upper abdominal and retroperitoneal lymphadenopathy appears similar compared to the prior examination. Specifically, hepatoduodenal ligament lymph node measuring 1.4 cm in short axis (axial image 48 of series 2), and left para-aortic lymph node adjacent to the left renal hilum (axial image 53 of series 2) measuring 1.6 cm in short axis. No other new lymphadenopathy noted elsewhere in the abdomen or pelvis. Reproductive: Status post hysterectomy. Ovaries are not confidently identified may be surgically absent or atrophic. Other: No significant volume of ascites.  No pneumoperitoneum. Musculoskeletal: Interbody cages no L4-L5. There are no aggressive appearing lytic or blastic lesions noted in the visualized portions of the skeleton. IMPRESSION: 1. Previously noted upper abdominal and retroperitoneal lymphadenopathy appears very similar to the prior study and remains concerning for nodal metastasis. 2. No other definite signs of extranodal metastatic disease elsewhere in the chest, abdomen or pelvis. 3. Previously noted small pulmonary nodules appear stable compared to recent prior examinations. 4. Morphologic changes in the liver suggestive of cirrhosis. 5. Colonic diverticulosis without evidence of acute diverticulitis at this time. 6. Aortic atherosclerosis, in addition to left main and 3 vessel coronary artery disease. 7. There are calcifications of the aortic valve. Echocardiographic correlation for evaluation of potential valvular dysfunction may be warranted if clinically indicated. 8. Additional incidental findings, as above. Electronically Signed   By: Daniel  Entrikin M.D.   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 EXAM: CT CHEST, ABDOMEN, AND PELVIS WITH CONTRAST TECHNIQUE: Multidetector CT imaging of the chest, abdomen and pelvis was performed following the standard protocol during bolus administration of intravenous contrast. CONTRAST:  143m ISOVUE-300 IOPAMIDOL (ISOVUE-300) INJECTION 61% COMPARISON:  CT the abdomen and pelvis 09/16/2018. CTA chest 07/30/2017. FINDINGS: CT CHEST FINDINGS Cardiovascular: Heart size is mildly enlarged. There is no significant pericardial fluid, thickening or pericardial calcification. There is aortic atherosclerosis, as well as atherosclerosis of the great vessels of the mediastinum and the coronary arteries, including calcified atherosclerotic plaque in the left main, left anterior descending, left circumflex and right coronary arteries. Thickening calcification of the aortic valve. Mediastinum/Nodes: No pathologically enlarged mediastinal or hilar lymph nodes. Esophagus is unremarkable in appearance. No axillary lymphadenopathy. Lungs/Pleura: Chronic elevation of the left hemidiaphragm with scarring in the left lung base, similar to the prior study. A few scattered pulmonary nodules appear stable compared to the prior examination in size and number, largest of which measures 4 mm in the right middle lobe (axial image 68 of series 4). No other larger more suspicious appearing pulmonary nodules or masses are noted. No acute consolidative airspace disease. No pleural effusions. Musculoskeletal: Heterogeneous areas of mild diffuse sclerosis again noted throughout the visualized axial and appendicular skeleton, similar to prior examinations. 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 CT ABDOMEN PELVIS FINDINGS Hepatobiliary: Liver has a slightly shrunken appearance and nodular appearance, compatible with mild cirrhosis. No  suspicious cystic or solid hepatic lesions. No intra or extrahepatic biliary ductal dilatation. Trace amount of pneumobilia in the left intrahepatic bile ducts. Status post cholecystectomy. Pancreas: Postoperative changes of Whipple procedure. Stent in the pancreatic duct. No suspicious pancreatic mass noted in the residual body and tail of the pancreas. No pancreatic ductal dilatation. No peripancreatic fluid collections or inflammatory changes noted. Spleen: Unremarkable. Adrenals/Urinary Tract: Exophytic intermediate attenuation lesion in the upper pole of the right kidney which appears distorted, similar to prior examinations measuring 1.6 x 1.0 cm, favored to represent an involuting cyst. Left kidney and bilateral adrenal glands are normal in appearance. No hydroureteronephrosis. Urinary bladder is unremarkable in appearance. Stomach/Bowel: Postoperative changes of Whipple procedure. No pathologic dilatation of small bowel or colon. A few scattered colonic diverticulae are noted, without surrounding inflammatory changes to suggest an acute diverticulitis at this time. Normal appendix. Vascular/Lymphatic: Aortic atherosclerosis, without evidence of aneurysm or dissection in the abdominal or pelvic vasculature. IVC filter in position with tip terminating below the level of the renal veins. Upper abdominal and retroperitoneal lymphadenopathy appears similar compared to the prior examination. Specifically, hepatoduodenal ligament lymph node measuring 1.4 cm in short axis (axial image 48 of series 2), and left para-aortic lymph node adjacent to the left renal hilum (axial image 53 of series 2) measuring 1.6 cm in short axis. No other new lymphadenopathy noted elsewhere in the abdomen or pelvis. Reproductive: Status post hysterectomy. Ovaries are not confidently identified may be surgically absent or atrophic. Other: No significant volume of ascites.  No pneumoperitoneum. Musculoskeletal: Interbody cages no L4-L5.  There are no aggressive appearing lytic or blastic lesions noted in the visualized portions of the skeleton. IMPRESSION: 1. Previously noted upper abdominal and retroperitoneal lymphadenopathy appears very similar to the prior study and remains concerning for nodal metastasis. 2. No other definite signs  of extranodal metastatic disease elsewhere in the chest, abdomen or pelvis. 3. Previously noted small pulmonary nodules appear stable compared to recent prior examinations. 4. Morphologic changes in the liver suggestive of cirrhosis. 5. Colonic diverticulosis without evidence of acute diverticulitis at this time. 6. Aortic atherosclerosis, in addition to left main and 3 vessel coronary artery disease. 7. There are calcifications of the aortic valve. Echocardiographic correlation for evaluation of potential valvular dysfunction may be warranted if clinically indicated. 8. Additional incidental findings, as above. Electronically Signed   By: Vinnie Langton M.D.   On: 03/24/2019 09:19   Mr Tibia Fibula Right W Wo Contrast  Result Date: 04/13/2019 CLINICAL DATA:  Anterior right lower leg pain, swelling, and bruising. Direct trauma to her right leg on 03/23/2019 while trying to get into a truck. EXAM: MRI OF LOWER RIGHT EXTREMITY WITHOUT AND WITH CONTRAST TECHNIQUE: Multiplanar, multisequence MR imaging of the right tibia and fibula was performed both before and after administration of intravenous contrast. CONTRAST:  9 mL Gadavist intravenous contrast. COMPARISON:  Right tibia and fibula x-rays from same day. FINDINGS: Bones/Joint/Cartilage No marrow signal abnormality. No fracture or dislocation. Normal alignment. No joint effusion. Bilateral knee osteoarthritis, advanced in the medial compartments. Muscles and Tendons Flexor, peroneal and extensor compartment tendons are intact. Faint edema and mild fatty infiltration of the lower leg muscles, nonspecific, but likely related to diabetic muscle changes. Soft  tissue There is a 2.2 x 6.2 x 7.4 cm fluid collection in the subcutaneous fat of the anterior distal lower leg. This communicates with a smaller more superficial crescentic fluid collection just deep to the skin surface, measuring 1.0 x 4.3 x 3.0 cm. Both fluid collections demonstrate areas slightly decreased T2 hyperintensity and mildly increased T1 signal intensity compared to muscle. The rim of both fluid collections is T1 hyperintense. The fluid collections are in close proximity to a prominent superficial vein (series 19, image 43) w. there is no significant surrounding soft tissue swelling. IMPRESSION: 1. There are two communicating fluid collections in the superficial subcutaneous fat of the anterior distal lower leg. The larger, deeper collection measures 2.2 x 6.2 x 7.4 cm, while the smaller more superficial collection just deep to the skin surface measures 1.0 x 4.3 x 3.0 cm. Both fluid collections demonstrate signal intensity consistent with hematomas, further supported by the lack of significant surrounding soft tissue swelling, close proximity to a prominent superficial vein, and the patient's elevated INR and lack of leukocytosis or fever. 2.  No acute osseous abnormality. Electronically Signed   By: Titus Dubin M.D.   On: 04/13/2019 07:50    Subjective: No acute issues or events overnight   Discharge Exam: Vitals:   04/13/19 2316 04/14/19 0613  BP: (!) 160/65 (!) 164/71  Pulse: (!) 55 (!) 53  Resp:  15  Temp:  98.2 F (36.8 C)  SpO2:  100%   Vitals:   04/13/19 1526 04/13/19 2052 04/13/19 2316 04/14/19 0613  BP: (!) 183/76 (!) 197/68 (!) 160/65 (!) 164/71  Pulse: (!) 55 (!) 54 (!) 55 (!) 53  Resp: _0 Temp: 98.2 F (36.8 C) 97.6 F (36.4 C)  98.2 F (36.8 C)  TempSrc: Oral Oral  Oral  SpO2: 100% 100%  100%  Weight:      Height:        General:  Pleasantly resting in bed, No acute distress. HEENT:  Normocephalic atraumatic.  Sclerae nonicteric, noninjected.   Extraocular movements intact bilaterally. Neck:  Without mass or deformity.  Trachea is midline. Lungs:  Clear to auscultate bilaterally without rhonchi, wheeze, or rales. Heart:  Regular rate and rhythm.  Without murmurs, rubs, or gallops. Abdomen:  Soft, nontender, nondistended.  Without guarding or rebound. Extremities: Without cyanosis, clubbing, edema, or obvious deformity. Vascular:  Dorsalis pedis and posterior tibial pulses palpable bilaterally. Skin:  Warm and dry, no erythema, no ulcerations.   The results of significant diagnostics from this hospitalization (including imaging, microbiology, ancillary and laboratory) are listed below for reference.     Microbiology: Recent Results (from the past 240 hour(s))  Blood culture (routine x 2)     Status: None (Preliminary result)   Collection Time: 04/12/19  6:59 PM   Specimen: BLOOD RIGHT HAND  Result Value Ref Range Status   Specimen Description BLOOD RIGHT HAND  Final   Special Requests   Final    BOTTLES DRAWN AEROBIC AND ANAEROBIC Blood Culture adequate volume   Culture   Final    NO GROWTH < 24 HOURS Performed at [facility], [place] [address], [place], [place] [address]    Report Status PENDING  Incomplete  Blood culture (routine x 2)     Status: None (Preliminary result)   Collection Time: 04/12/19  7:07 PM   Specimen: BLOOD  Result Value Ref Range Status   Specimen Description BLOOD LEFT ANTECUBITAL  Final   Special Requests   Final    BOTTLES DRAWN AEROBIC ONLY Blood Culture results may not be optimal due to an inadequate volume of blood received in culture bottles   Culture   Final    NO GROWTH < 24 HOURS Performed at [facility], [place] [address] [place], [place] [address]    Report Status PENDING  Incomplete     Labs: BNP (last 3 results) No results for input(s): BNP in the last 8760 hours. Basic Metabolic Panel: Recent Labs  Lab 04/12/19 1325 04/13/19 0445 04/14/19 0452  NA 138 140 139  K  3.9 3.5 3.8  CL 103 107 105  CO2 _0 GLUCOSE 356* 291* 162*  BUN 24* 24* 18  CREATININE 1.11* 1.26* 1.02*  CALCIUM 8.6* 8.1* 8.2*   Liver Function Tests: Recent Labs  Lab 04/12/19 1325  AST 33  ALT 23  ALKPHOS 89  BILITOT 1.2  PROT 7.2  ALBUMIN 3.3*   CBC: Recent Labs  Lab 04/12/19 1325 04/13/19 0445  WBC 6.0 6.4  NEUTROABS 4.1  --   HGB 12.2 10.4*  HCT 38.9 32.3*  MCV 100.8* 99.1  PLT 294 298   CBG: Recent Labs  Lab 04/13/19 0658 04/13/19 1244 04/13/19 1645 04/13/19 2139 04/14/19 0646  GLUCAP 251* 216* 186* 95 172*   Hgb A1c Recent Labs    04/12/19 1859  HGBA1C 9.1*   Urinalysis    Component Value Date/Time   COLORURINE YELLOW 02/05/2017 2352   APPEARANCEUR HAZY (A) 02/05/2017 2352   LABSPEC 1.012 02/05/2017 2352   PHURINE 5.0 02/05/2017 2352   GLUCOSEU NEGATIVE 02/05/2017 2352   HGBUR NEGATIVE 02/05/2017 2352   BILIRUBINUR NEGATIVE 02/05/2017 2352   KETONESUR NEGATIVE 02/05/2017 2352   PROTEINUR NEGATIVE 02/05/2017 2352   UROBILINOGEN 0.2 09/16/2014 1317   NITRITE NEGATIVE 02/05/2017 2352   LEUKOCYTESUR NEGATIVE 02/05/2017 2352   Microbiology Recent Results (from the past 240 hour(s))  Blood culture (routine x 2)     Status: None (Preliminary result)   Collection Time: 04/12/19  6:59 PM   Specimen:  BLOOD RIGHT HAND  Result Value Ref Range Status   Specimen Description BLOOD RIGHT HAND  Final   Special Requests   Final    BOTTLES DRAWN AEROBIC AND ANAEROBIC Blood Culture adequate volume   Culture   Final    NO GROWTH < 24 HOURS Performed at [facility], [address] [place], [place] [address]    Report Status PENDING  Incomplete  Blood culture (routine x 2)     Status: None (Preliminary result)   Collection Time: 04/12/19  7:07 PM   Specimen: BLOOD  Result Value Ref Range Status   Specimen Description BLOOD LEFT ANTECUBITAL  Final   Special Requests   Final    BOTTLES DRAWN AEROBIC ONLY Blood Culture results may not  be optimal due to an inadequate volume of blood received in culture bottles   Culture   Final    NO GROWTH < 24 HOURS Performed at [facility], [address] [place], [place] [address]    Report Status PENDING  Incomplete    Time coordinating discharge: Over 30 minutes  SIGNED:  William C Lancaster, DO [facility] 04/14/2019, 8:17 AM Pager   If 7PM-7AM, please contact night-coverage [email or web address] Password [email or web address]  

## 2019-04-14 NOTE — Plan of Care (Signed)
  Problem: Pain Managment: Goal: General experience of comfort will improve Outcome: Progressing   Problem: Skin Integrity: Goal: Risk for impaired skin integrity will decrease Outcome: Progressing   Problem: Safety: Goal: Ability to remain free from injury will improve Outcome: Progressing   

## 2019-04-17 LAB — CULTURE, BLOOD (ROUTINE X 2)
Culture: NO GROWTH
Culture: NO GROWTH
Special Requests: ADEQUATE

## 2019-04-18 ENCOUNTER — Encounter (HOSPITAL_BASED_OUTPATIENT_CLINIC_OR_DEPARTMENT_OTHER): Payer: Medicare Other | Attending: Internal Medicine

## 2019-04-18 ENCOUNTER — Other Ambulatory Visit: Payer: Self-pay

## 2019-04-18 DIAGNOSIS — S8011XA Contusion of right lower leg, initial encounter: Secondary | ICD-10-CM | POA: Insufficient documentation

## 2019-04-18 DIAGNOSIS — I509 Heart failure, unspecified: Secondary | ICD-10-CM | POA: Insufficient documentation

## 2019-04-18 DIAGNOSIS — Z85528 Personal history of other malignant neoplasm of kidney: Secondary | ICD-10-CM | POA: Diagnosis not present

## 2019-04-18 DIAGNOSIS — Z86718 Personal history of other venous thrombosis and embolism: Secondary | ICD-10-CM | POA: Diagnosis not present

## 2019-04-18 DIAGNOSIS — E1040 Type 1 diabetes mellitus with diabetic neuropathy, unspecified: Secondary | ICD-10-CM | POA: Diagnosis not present

## 2019-04-18 DIAGNOSIS — W228XXA Striking against or struck by other objects, initial encounter: Secondary | ICD-10-CM | POA: Diagnosis not present

## 2019-04-18 DIAGNOSIS — Z8673 Personal history of transient ischemic attack (TIA), and cerebral infarction without residual deficits: Secondary | ICD-10-CM | POA: Insufficient documentation

## 2019-04-18 DIAGNOSIS — Z923 Personal history of irradiation: Secondary | ICD-10-CM | POA: Insufficient documentation

## 2019-04-18 DIAGNOSIS — Z794 Long term (current) use of insulin: Secondary | ICD-10-CM | POA: Insufficient documentation

## 2019-04-18 DIAGNOSIS — Z85850 Personal history of malignant neoplasm of thyroid: Secondary | ICD-10-CM | POA: Insufficient documentation

## 2019-04-18 DIAGNOSIS — Z7901 Long term (current) use of anticoagulants: Secondary | ICD-10-CM | POA: Insufficient documentation

## 2019-04-18 DIAGNOSIS — I87301 Chronic venous hypertension (idiopathic) without complications of right lower extremity: Secondary | ICD-10-CM | POA: Insufficient documentation

## 2019-04-18 DIAGNOSIS — I110 Hypertensive heart disease with heart failure: Secondary | ICD-10-CM | POA: Insufficient documentation

## 2019-04-25 ENCOUNTER — Telehealth: Payer: Self-pay | Admitting: *Deleted

## 2019-04-25 DIAGNOSIS — S8011XA Contusion of right lower leg, initial encounter: Secondary | ICD-10-CM | POA: Diagnosis not present

## 2019-04-25 NOTE — Telephone Encounter (Signed)
Calling to reschedule her missed appointment of 03/28/19. Scheduling message sent to call patient to reschedule this.

## 2019-04-26 ENCOUNTER — Telehealth: Payer: Self-pay | Admitting: Oncology

## 2019-04-26 NOTE — Telephone Encounter (Signed)
Scheduled appt per 9/8 sch message - spoke with pt and pt is aware of appt date and time .

## 2019-04-28 NOTE — Progress Notes (Deleted)
Cardiology Office Note   Date:  04/28/2019   ID:  Kaylee Mullins, DOB April 15, 1936, MRN [id number]  PCP:  Nolene Ebbs, MD  Cardiologist:   Dorris Carnes, MD   Pt rpresents for f/u of   HTN      History of Present Illness: Kaylee Mullins is a 83 y.o. female with a history of HTN, HL, DM,  CVA, DVT (Rx with lovenox Summer [id number]; complicated by rectus sheath hematoma; IVC filter placed)  She also has a histery of Whipple for ampullary Ca, renal CA, thyroid CAD  Had SVT during hosp   Pt seen in f/u by  Ashok Norris in Dec 2018    LE doppler was ordered   Veins partially compressible   [facility] CT angio of chest  No PE  2 small pulmonary nodules noted     CT of abdomen showed IVC filter    Thrombus in IVC into iliac veins  Some subacute  Some chronic   Atherosclerois noted    I saw the pt in April  2019 The pt says her breathing is OK if she doesn't push things     Followd by Dr Benay Spice   PET sche scheduled.    Denies palptiaotns     Only dizzy with change in position     Outpatient Medications Prior to Visit  Medication Sig Dispense Refill  . acetaminophen (TYLENOL) 500 MG tablet Take 500 mg by mouth every 6 (six) hours as needed for mild pain.    . carvedilol (COREG) 25 MG tablet Take 1 tablet (25 mg total) by mouth 2 (two) times daily. 180 tablet 3  . furosemide (LASIX) 80 MG tablet Take as directed.  Take 80 mg daily alternating with 40 mg daily (Patient taking differently: Take 40-80 mg by mouth See admin instructions. Take 80 mg daily alternating with 40 mg daily.)    . HUMALOG KWIKPEN 100 UNIT/ML KwikPen Inject 7-10 Units into the skin See admin instructions. Three times daily per sliding scale    . insulin detemir (LEVEMIR) 100 UNIT/ML injection Inject 0.07 mLs (7 Units total) into the skin 2 (two) times daily. (Patient taking differently: Inject 40 Units into the skin daily. ) 10 mL 0  . levothyroxine (SYNTHROID, LEVOTHROID) 200 MCG tablet Take 200 mcg by mouth daily before breakfast. For  hypothyroidism  99  . losartan (COZAAR) 100 MG tablet Take 100 mg by mouth daily.    Marland Kitchen NIFEdipine (PROCARDIA-XL/ADALAT-CC/NIFEDICAL-XL) 30 MG 24 hr tablet Take 30 mg by mouth daily.  2  . pantoprazole (PROTONIX) 40 MG tablet Take 1 tablet (40 mg total) by mouth at bedtime. 30 tablet 0  . potassium chloride SA (KLOR-CON M20) 20 MEQ tablet Take 3 tablets daily alternating with 1 tablet daily.  Take with furosemide. (Patient taking differently: Take 40 mEq by mouth daily. ) 270 tablet 3  . Vitamin D, Ergocalciferol, (DRISDOL) 50000 units CAPS capsule Take 50,000 Units by mouth once a week.  5  . warfarin (COUMADIN) 7.5 MG tablet Take 1 tablet (7.5 mg total) by mouth one time only at 6 PM. 30 tablet 0   No facility-administered medications prior to visit.      Allergies:   Hydralazine hcl, Amlodipine besylate, Darvon [propoxyphene hcl], Nyquil multi-symptom [pseudoeph-doxylamine-dm-apap], and Metformin and related   Past Medical History:  Diagnosis Date  . Acute CVA (cerebrovascular accident) ([place]) 09/16/2014  . Acute DVT of left tibial vein ([place]) 04/03/2017  . Adenocarcinoma ([place]) 01/12/2017  .  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 Anxiety   . Arthritis    knees  . Black tarry stools    05-14-16 negative for occult blood with ER visit- noted in [place].  . Cancer [facility])    thyroid cancer- surgery and radiation  . Cerebral infarction due to unspecified mechanism   . Chronic kidney disease    questionable mass on kidney. Being followed by Dr Diona Fanti  . Complication of anesthesia    heart rate was really low  . CVA (cerebral infarction) 09/11/2014  . Depression   . Diabetes mellitus    type 2  . Diabetes mellitus without complication ([place]) [id number]   Qualifier: Diagnosis of  By: Loanne Drilling MD, Jacelyn Pi   . Dyslipidemia 04/20/2007   Qualifier: Diagnosis of  By: Loanne Drilling MD, Jacelyn Pi   . Full dentures   . Gastroparesis 02/06/2017  . GERD (gastroesophageal reflux disease) 04/03/2017  . History of CVA  (cerebral vascular accident) ([place]) 09/11/2014  . Hypertension   . Hypokalemia 08/25/2017  . Hypothyroidism   . Lumbar stenosis with neurogenic claudication 02/10/2016  . Osteoarthritis 09/11/2014  . Pneumonia   . Right renal mass 09/13/2014  . Spinal stenosis   . Stroke ([place]) 09/2014   left sided weakness  . SVT (supraventricular tachycardia) ([place]) 07/30/2017    Past Surgical History:  Procedure Laterality Date  . ABDOMINAL HYSTERECTOMY     partial  . cataracts     Removed  11/2015  bilateral  . COLONOSCOPY W/ POLYPECTOMY    . ERCP N/A 05/07/2016   Procedure: ENDOSCOPIC RETROGRADE CHOLANGIOPANCREATOGRAPHY (ERCP);  Surgeon: Clarene Essex, MD;  Location: [facility] ENDOSCOPY;  Service: Endoscopy;  Laterality: N/A;  . ERCP N/A 10/16/2016   Procedure: ENDOSCOPIC RETROGRADE CHOLANGIOPANCREATOGRAPHY (ERCP);  Surgeon: Clarene Essex, MD;  Location: [facility] ENDOSCOPY;  Service: Endoscopy;  Laterality: N/A;  . ESOPHAGOGASTRODUODENOSCOPY N/A 02/10/2017   Procedure: ESOPHAGOGASTRODUODENOSCOPY (EGD);  Surgeon: Teena Irani, MD;  Location: [facility] ENDOSCOPY;  Service: Endoscopy;  Laterality: N/A;  . EUS N/A 05/27/2016   Procedure: ESOPHAGEAL ENDOSCOPIC ULTRASOUND (EUS) RADIAL;  Surgeon: Arta Silence, MD;  Location: [facility] ENDOSCOPY;  Service: Endoscopy;  Laterality: N/A;  . FLEXIBLE SIGMOIDOSCOPY  03/29/2012   Procedure: FLEXIBLE SIGMOIDOSCOPY;  Surgeon: Jeryl Columbia, MD;  Location[facility] ENDOSCOPY;  Service: Endoscopy;  Laterality: N/A;  fleet enema upon arrival  . HOT HEMOSTASIS  03/29/2012   Procedure: HOT HEMOSTASIS (ARGON PLASMA COAGULATION/BICAP);  Surgeon: Jeryl Columbia, MD;  Location[facility]COPY;  Service: Endoscopy;  Laterality: N/A;  . IR GASTR TUBE CONVERT GASTR-JEJ PER W/FL MOD SED  02/23/2017  . IR GASTROSTOMY TUBE MOD SED  02/16/2017  . IR GENERIC HISTORICAL  06/30/2016   IR RADIOLOGIST EVAL & MGMT 06/30/2016 Aletta Edouard, MD [facility] INTERV RAD  . IR GENERIC HISTORICAL  09/09/2016   IR RADIOLOGIST EVAL & MGMT 09/09/2016  Aletta Edouard, MD [facility] INTERV RAD  . IR IVC FILTER PLMT / S&I /IMG GUID/MOD SED  03/03/2017  . IR PATIENT EVAL TECH 0-60 MINS  05/12/2017  . IR RADIOLOGIST EVAL & MGMT  11/24/2017  . IR RADIOLOGIST EVAL & MGMT  04/07/2018  . LAPAROSCOPY N/A 01/12/2017   Procedure: LAPAROSCOPY DIAGNOSTIC;  Surgeon: Stark Klein, MD;  Location: [place];  Service: General;  Laterality: N/A;  . LAPAROTOMY N/A 03/10/2017   Procedure: EXPLORATORY LAPAROTOMY Open jejunostomy tube;  Surgeon: Stark Klein, MD;  Location: [place];  Service: General;  Laterality: N/A;  . LUMBAR LAMINECTOMY/DECOMPRESSION MICRODISCECTOMY N/A 02/10/2016   Procedure: Lumbar  three- four Laminectomy;  Surgeon: Kristeen Miss, MD;  Location: [facility] NEURO ORS;  Service: Neurosurgery;  Laterality: N/A;  L3-4 Laminectomy  . LUMBAR SPINE SURGERY     1st surgery "ray cage placed"  . THYROIDECTOMY  2005  . TONSILLECTOMY    . WHIPPLE PROCEDURE N/A 01/12/2017   Procedure: WHIPPLE PROCEDURE;  Surgeon: Stark Klein, MD;  Location: [place];  Service: General;  Laterality: N/A;     Social History:  The patient  reports that she has never smoked. She has never used smokeless tobacco. She reports current drug use. Drug: Methamphetamines. She reports that she does not drink alcohol.   Family History:  The patient's family history includes Heart attack in her father; Heart failure in her mother.    ROS:  Please see the history of present illness. All other systems are reviewed and  Negative to the above problem except as noted.    PHYSICAL EXAM: VS:  There were no vitals taken for this visit.  Gen   Pt is an obese  83 yo  in no acute distress  HEENT: normal  Neck:  No JVD   No  carotid bruits, or masses Cardiac: RRR; no murmurs, rubs, or gallops  1+ LE  edema   (soft) Respiratory:  clear to auscultation bilaterally, normal work of breathing GI: soft, nontender, nondistended, + BS  No hepatomegaly  MS: no deformity Moving all extremities   Skin: warm and dry,  no rash Neuro:  Strength and sensation are intact Psych: euthymic mood, full affect   EKG:  EKG is not ordered today.   Lipid Panel    Component Value Date/Time   CHOL 120 04/09/2017   TRIG 137 04/09/2017   HDL 35 04/09/2017   CHOLHDL 3.0 09/13/2014 0910   VLDL 26 09/13/2014 0910   LDLCALC 58 04/09/2017      Wt Readings from Last 3 Encounters:  04/12/19 190 lb (86.2 kg)  09/26/18 197 lb 12.8 oz (89.7 kg)  09/09/18 201 lb 9.6 oz (91.4 kg)      ASSESSMENT AND PLAN: \ 1  LE edema/  Hx DVT    Persists but not as bad as previous visitls   Continue meds  Check BMET   2  Thyroid  Will recheck TSH    3  SVT  No recurrence   4  HTN  BP is wll controlled     5.  HL Continue Crestor      Tentative f/u in 6   months    Signed, Dorris Carnes, MD  04/28/2019 9:17 PM    [address], [place], [place]  [address] Phone: [phone]; Fax: [phone]

## 2019-05-01 ENCOUNTER — Ambulatory Visit: Payer: Medicare Other | Admitting: Internal Medicine

## 2019-05-02 ENCOUNTER — Encounter: Payer: Self-pay | Admitting: Nurse Practitioner

## 2019-05-02 ENCOUNTER — Inpatient Hospital Stay: Payer: Medicare Other

## 2019-05-02 ENCOUNTER — Inpatient Hospital Stay: Payer: Medicare Other | Attending: Internal Medicine | Admitting: Nurse Practitioner

## 2019-05-02 ENCOUNTER — Other Ambulatory Visit: Payer: Self-pay

## 2019-05-02 VITALS — BP 165/100 | HR 102 | Temp 98.0°F | Resp 17 | Ht 64.0 in | Wt 193.1 lb

## 2019-05-02 DIAGNOSIS — Z85528 Personal history of other malignant neoplasm of kidney: Secondary | ICD-10-CM | POA: Diagnosis not present

## 2019-05-02 DIAGNOSIS — I10 Essential (primary) hypertension: Secondary | ICD-10-CM | POA: Insufficient documentation

## 2019-05-02 DIAGNOSIS — Z85850 Personal history of malignant neoplasm of thyroid: Secondary | ICD-10-CM | POA: Insufficient documentation

## 2019-05-02 DIAGNOSIS — R590 Localized enlarged lymph nodes: Secondary | ICD-10-CM | POA: Insufficient documentation

## 2019-05-02 DIAGNOSIS — C241 Malignant neoplasm of ampulla of Vater: Secondary | ICD-10-CM | POA: Diagnosis not present

## 2019-05-02 DIAGNOSIS — E119 Type 2 diabetes mellitus without complications: Secondary | ICD-10-CM | POA: Insufficient documentation

## 2019-05-02 DIAGNOSIS — K7460 Unspecified cirrhosis of liver: Secondary | ICD-10-CM | POA: Diagnosis not present

## 2019-05-02 DIAGNOSIS — Z7901 Long term (current) use of anticoagulants: Secondary | ICD-10-CM | POA: Insufficient documentation

## 2019-05-02 DIAGNOSIS — Z86718 Personal history of other venous thrombosis and embolism: Secondary | ICD-10-CM | POA: Diagnosis not present

## 2019-05-02 NOTE — Progress Notes (Addendum)
[facility] OFFICE PROGRESS NOTE   Diagnosis: Ampullary carcinoma  INTERVAL HISTORY:   Kaylee Mullins returns for follow-up.  She reports the leg wound is healing.  She is being followed at the wound center.  She has occasional low back pain.  She describes her appetite is "okay".  No nausea.  She had an episode of vomiting last month.  Objective:  Vital signs in last 24 hours:  Blood pressure (!) 165/100, pulse (!) 102, temperature 98 F (36.7 C), temperature source Oral, resp. rate 17, height 5\' 4"  (1.626 m), weight 193 lb 1.6 oz (87.6 kg), SpO2 100 %.    HEENT: Neck without mass. Lymphatics: No palpable cervical, supraclavicular, axillary or inguinal lymph nodes. GI: Abdomen soft and nontender.  No hepatomegaly.  No mass. Vascular: Left lower leg with trace edema.  Right lower leg is wrapped. Neuro: Alert and oriented.   Lab Results:  Lab Results  Component Value Date   WBC 6.4 04/13/2019   HGB 10.4 (L) 04/13/2019   HCT 32.3 (L) 04/13/2019   MCV 99.1 04/13/2019   PLT 298 04/13/2019   NEUTROABS 4.1 04/12/2019    Imaging:  No results found.  Medications: I have reviewed the patient's current medications.  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 CT abdomen/pelvis 10/23/2016-ampullary mass, single mildly enlarged porta hepatis lymph node, no evidence of distant metastatic disease ? Status post pancreaticoduodenectomy 01/12/2017; pT3pN2 ? CT abdomen/pelvis 03/30/2018-ablation defect within the upper pole of the right kidney. No evidence of recurrent renal mass. New mild left periaortic retroperitoneal lymphadenopathy. ? CT abdomen/pelvis 06/30/2018-6 mm right middle lobe nodule-slowly growing over multiple CTs, stable ablation defect in the right kidney, 1.9 cm left periaortic node increased from 1.2 cm, stable 1.5 cm porta hepatis node ? CT  abdomen/pelvis 09/17/2018- surgical changes related to Whipple procedure.  Stable upper abdominal/retroperitoneal lymphadenopathy.  5 mm right middle lobe nodule, stable from most recent CT but mildly progressive from priors.  Postprocedural changes related to renal ablation in the medial right upper kidney. ? CTs 03/24/2019- a few scattered pulmonary nodules appears stable; liver has a slightly shrunken appearance and nodular appearance compatible with mild cirrhosis.  No suspicious cystic or solid hepatic lesions.  Upper abdominal and retroperitoneal lymphadenopathy appears similar compared to the prior study.  No other new lymphadenopathy noted elsewhere in the abdomen or pelvis. 2. Biliary obstruction secondary to #1, status post placement of a metal, bile duct stent on 10/23/2016  3. 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 Renal cell carcinoma-status post ablation of a right renal mass 07/15/2016, biopsy confirmed papillary renal cell carcinoma, Fuhrman grade 3  5. CVA in 2016  6. History of thyroid cancer-status post thyroidectomy and radioactive iodine in 2005  7. Diabetes  8.Hypertension  9.Left lower extremity deep vein thrombosis June 2019, IVC filter placed July 2018 after she was diagnosed with a rectus hematoma while on Lovenox  10.   Hospitalized 04/12/2019 through 04/14/2019 with traumatic hematoma right lower leg, followed at the wound clinic   Disposition: Kaylee Mullins appears unchanged.  The CTs from last month showed stable upper abdominal/retroperitoneal lymphadenopathy, no new lymphadenopathy.  We reviewed the results with her at today's visit.  Plan to continue to follow with observation.  She will return to the lab today for a CA-19-9.  She will return for lab and follow-up in 4 months.  She will contact the  office in the interim with any problems.  Patient seen with Dr. Benay Spice.   [name] ANP/GNP-BC   05/02/2019   2:27 PM  This was a shared visit with [name].  Kaylee Mullins appears asymptomatic from pancreas cancer.  The restaging CT on 03/24/2019 showed no evidence of disease progression.  The plan is to continue observation.  [name], MD

## 2019-05-03 ENCOUNTER — Telehealth: Payer: Self-pay | Admitting: Oncology

## 2019-05-03 DIAGNOSIS — S8011XA Contusion of right lower leg, initial encounter: Secondary | ICD-10-CM | POA: Diagnosis not present

## 2019-05-03 NOTE — Telephone Encounter (Signed)
Called and spoke with patient. Confirmed appt  °

## 2019-05-04 LAB — CANCER ANTIGEN 19-9: CA 19-9: 5571 U/mL — ABNORMAL HIGH (ref 0–35)

## 2019-05-10 DIAGNOSIS — S8011XA Contusion of right lower leg, initial encounter: Secondary | ICD-10-CM | POA: Diagnosis not present

## 2019-05-11 ENCOUNTER — Other Ambulatory Visit: Payer: Self-pay | Admitting: Interventional Radiology

## 2019-05-11 DIAGNOSIS — C641 Malignant neoplasm of right kidney, except renal pelvis: Secondary | ICD-10-CM

## 2019-05-17 DIAGNOSIS — S8011XA Contusion of right lower leg, initial encounter: Secondary | ICD-10-CM | POA: Diagnosis not present

## 2019-05-18 ENCOUNTER — Other Ambulatory Visit: Payer: Self-pay

## 2019-05-18 ENCOUNTER — Ambulatory Visit
Admission: RE | Admit: 2019-05-18 | Discharge: 2019-05-18 | Disposition: A | Discharge status: Home / Self Care | Payer: Medicare Other | Source: Ambulatory Visit | Attending: Interventional Radiology | Admitting: Interventional Radiology

## 2019-05-18 ENCOUNTER — Encounter: Payer: Self-pay | Admitting: *Deleted

## 2019-05-18 DIAGNOSIS — C641 Malignant neoplasm of right kidney, except renal pelvis: Secondary | ICD-10-CM

## 2019-05-18 HISTORY — PX: IR RADIOLOGIST EVAL & MGMT: IMG5224

## 2019-05-18 NOTE — Progress Notes (Signed)
Chief Complaint: Patient was consulted remotely today (TeleHealth) for follow up after cryoablation of a right papillary renal carcinoma.   History of Present Illness: Kaylee Mullins is a 83 y.o. 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  She states that she has been doing fairly well.  She did have a recent right lower leg injury in August that resulted in a soft tissue wound and hematoma that is now nearly completely healed.  She denies any urinary symptoms or significant abdominal symptoms.  She recently saw Dr. Benay Spice and [name], NP on 05/02/2019.  Past Medical History:  Diagnosis Date  . Acute CVA (cerebrovascular accident) ([place]) 09/16/2014  . Acute DVT of left tibial vein ([place]) 04/03/2017  . Adenocarcinoma ([place]) 01/12/2017  . 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 Anxiety   . Arthritis    knees  . Black tarry stools    05-14-16 negative for occult blood with ER visit- noted in [place].  . [facility])    thyroid cancer- surgery and radiation  . Cerebral infarction due to unspecified mechanism   . Chronic kidney disease    questionable mass on kidney. Being followed by Dr Diona Fanti  . Complication of anesthesia    heart rate was really low  . CVA (cerebral infarction) 09/11/2014  . Depression   . Diabetes mellitus    type 2  . Diabetes mellitus without complication ([place]) [id number]   Qualifier: Diagnosis of  By: Loanne Drilling MD, Jacelyn Pi   . Dyslipidemia 04/20/2007   Qualifier: Diagnosis of  By: Loanne Drilling MD, Jacelyn Pi   . Full dentures   . Gastroparesis 02/06/2017  . GERD (gastroesophageal reflux disease) 04/03/2017  . History of CVA (cerebral vascular accident) ([place]) 09/11/2014  . Hypertension   . Hypokalemia 08/25/2017  . Hypothyroidism   . Lumbar stenosis with neurogenic claudication 02/10/2016  . Osteoarthritis  09/11/2014  . Pneumonia   . Right renal mass 09/13/2014  . Spinal stenosis   . Stroke ([place]) 09/2014   left sided weakness  . SVT (supraventricular tachycardia) ([place]) 07/30/2017    Past Surgical History:  Procedure Laterality Date  . ABDOMINAL HYSTERECTOMY     partial  . cataracts     Removed  11/2015  bilateral  . COLONOSCOPY W/ POLYPECTOMY    . ERCP N/A 05/07/2016   Procedure: ENDOSCOPIC RETROGRADE CHOLANGIOPANCREATOGRAPHY (ERCP);  Surgeon: Clarene Essex, MD;  Location: [facility] ENDOSCOPY;  Service: Endoscopy;  Laterality: N/A;  . ERCP N/A 10/16/2016   Procedure: ENDOSCOPIC RETROGRADE CHOLANGIOPANCREATOGRAPHY (ERCP);  Surgeon: Clarene Essex, MD;  Location: [facility] ENDOSCOPY;  Service: Endoscopy;  Laterality: N/A;  . ESOPHAGOGASTRODUODENOSCOPY N/A 02/10/2017   Procedure: ESOPHAGOGASTRODUODENOSCOPY (EGD);  Surgeon: Teena Irani, MD;  Location: [facility] ENDOSCOPY;  Service: Endoscopy;  Laterality: N/A;  . EUS N/A 05/27/2016   Procedure: ESOPHAGEAL ENDOSCOPIC ULTRASOUND (EUS) RADIAL;  Surgeon: Arta Silence, MD;  Location: [facility] ENDOSCOPY;  Service: Endoscopy;  Laterality: N/A;  . FLEXIBLE SIGMOIDOSCOPY  03/29/2012   Procedure: FLEXIBLE SIGMOIDOSCOPY;  Surgeon: Jeryl Columbia, MD;  Location: [facility] ENDOSCOPY;  Service: Endoscopy;  Laterality: N/A;  fleet enema upon arrival  . HOT HEMOSTASIS  03/29/2012   Procedure: HOT HEMOSTASIS (ARGON PLASMA COAGULATION/BICAP);  Surgeon: Jeryl Columbia, MD;  Location: [facility] ENDOSCOPY;  Service: Endoscopy;  Laterality: N/A;  . IR GASTR TUBE CONVERT GASTR-JEJ PER W/FL MOD SED  02/23/2017  .  IR GASTROSTOMY TUBE MOD SED  02/16/2017  . IR GENERIC HISTORICAL  06/30/2016   IR RADIOLOGIST EVAL & MGMT 06/30/2016 Aletta Edouard, MD [facility] INTERV RAD  . IR GENERIC HISTORICAL  09/09/2016   IR RADIOLOGIST EVAL & MGMT 09/09/2016 Aletta Edouard, MD [facility] INTERV RAD  . IR IVC FILTER PLMT / S&I /IMG GUID/MOD SED  03/03/2017  . IR PATIENT EVAL TECH 0-60 MINS  05/12/2017  . IR RADIOLOGIST EVAL & MGMT  11/24/2017  . IR  RADIOLOGIST EVAL & MGMT  04/07/2018  . LAPAROSCOPY N/A 01/12/2017   Procedure: LAPAROSCOPY DIAGNOSTIC;  Surgeon: Stark Klein, MD;  Location: [place];  Service: General;  Laterality: N/A;  . LAPAROTOMY N/A 03/10/2017   Procedure: EXPLORATORY LAPAROTOMY Open jejunostomy tube;  Surgeon: Stark Klein, MD;  Location: [facility];  Service: General;  Laterality: N/A;  . LUMBAR LAMINECTOMY/DECOMPRESSION MICRODISCECTOMY N/A 02/10/2016   Procedure: Lumbar three- four Laminectomy;  Surgeon: Kristeen Miss, MD;  Location: [facility]O [facility];  Service: Neurosurgery;  Laterality: N/A;  L3-4 Laminectomy  . LUMBAR SPINE SURGERY     1st surgery "ray cage placed"  . THYROIDECTOMY  2005  . TONSILLECTOMY    . WHIPPLE PROCEDURE N/A 01/12/2017   Procedure: WHIPPLE PROCEDURE;  Surgeon: Stark Klein, MD;  Location: [facility];  Service: General;  Laterality: N/A;    Allergies: Hydralazine hcl, Amlodipine besylate, Darvon [propoxyphene hcl], Nyquil multi-symptom [pseudoeph-doxylamine-dm-apap], and Metformin and related  Medications: Prior to Admission medications   Medication Sig Start Date End Date Taking? Authorizing Provider  acetaminophen (TYLENOL) 500 MG tablet Take 500 mg by mouth every 6 (six) hours as needed for mild pain.    [provider]  carvedilol (COREG) 25 MG tablet Take 1 tablet (25 mg total) by mouth 2 (two) times daily. 07/30/17 04/13/19  Sueanne Margarita, MD  furosemide (LASIX) 80 MG tablet Take as directed.  Take 80 mg daily alternating with 40 mg daily Patient taking differently: Take 40-80 mg by mouth See admin instructions. Take 80 mg daily alternating with 40 mg daily. 11/22/17   Fay Records, MD  HUMALOG KWIKPEN 100 UNIT/ML KwikPen Inject 7-10 Units into the skin See admin instructions. Three times daily per sliding scale 06/30/18   [provider]  insulin detemir (LEVEMIR) 100 UNIT/ML injection Inject 0.07 mLs (7 Units total) into the skin 2 (two) times daily. Patient taking differently:  Inject 40 Units into the skin daily.  08/06/17   Doreatha Lew, MD  levothyroxine (SYNTHROID, LEVOTHROID) 200 MCG tablet Take 200 mcg by mouth daily before breakfast. For hypothyroidism 11/11/15   [provider]  losartan (COZAAR) 100 MG tablet Take 100 mg by mouth daily. 08/02/18   [provider]  NIFEdipine (PROCARDIA-XL/ADALAT-CC/NIFEDICAL-XL) 30 MG 24 hr tablet Take 30 mg by mouth daily. 07/04/17   [provider]  pantoprazole (PROTONIX) 40 MG tablet Take 1 tablet (40 mg total) by mouth at bedtime. 02/04/17   Stark Klein, MD  potassium chloride SA (KLOR-CON M20) 20 MEQ tablet Take 3 tablets daily alternating with 1 tablet daily.  Take with furosemide. Patient taking differently: Take 40 mEq by mouth daily.  11/22/17   Fay Records, MD  Vitamin D, Ergocalciferol, (DRISDOL) 50000 units CAPS capsule Take 50,000 Units by mouth once a week. 12/14/17   [provider]  warfarin (COUMADIN) 7.5 MG tablet Take 1 tablet (7.5 mg total) by mouth one time only at 6 PM. 08/06/17   Patrecia Pour, Christean Grief, MD  Family History  Problem Relation Age of Onset  . Heart failure Mother   . Heart attack Father     Social History   Socioeconomic History  . Marital status: Widowed    Spouse name: Not on file  . Number of children: Not on file  . Years of education: Not on file  . Highest education level: Not on file  Occupational History  . Occupation: housewife    Comment: college housekeeping  Social Needs  . Financial resource strain: Not on file  . Food insecurity    Worry: Not on file    Inability: Not on file  . Transportation needs    Medical: Not on file    Non-medical: Not on file  Tobacco Use  . Smoking status: Never Smoker  . Smokeless tobacco: Never Used  Substance and Sexual Activity  . Alcohol use: No  . Drug use: Yes    Types: Methamphetamines  . Sexual activity: Never  Lifestyle  . Physical activity    Days per week: Not on file     Minutes per session: Not on file  . Stress: Not on file  Relationships  . Social Herbalist on phone: Not on file    Gets together: Not on file    Attends religious service: Not on file    Active member of club or organization: Not on file    Attends meetings of clubs or organizations: Not on file    Relationship status: Not on file  Other Topics Concern  . Not on file  Social History Narrative   Widowed,  retired, 3 children living, 2 children deceased   Caffeine use - soda every few days   Right handed   Pt lives alone.  Using cane when out and about.    Admitted to [facility] 03/29/17   Never smoked   Alcohol none    Full Code    Review of Systems  Constitutional: Negative.   Respiratory: Negative.   Cardiovascular: Negative.   Gastrointestinal: Negative.   Genitourinary: Negative.   Musculoskeletal: Negative.   Neurological: Negative.     Review of Systems: A 12 point ROS discussed and pertinent positives are indicated in the HPI above.  All other systems are negative.  Physical Exam No direct physical exam was performed (except for noted visual exam findings with Video Visits).   Vital Signs: There were no vitals taken for this visit.  Imaging: No results found.  Labs:  CBC: Recent Labs    04/12/19 1325 04/13/19 0445  WBC 6.0 6.4  HGB 12.2 10.4*  HCT 38.9 32.3*  PLT 294 298    COAGS: Recent Labs    08/19/18 04/12/19 1325 04/13/19 0445 04/14/19 0452  INR 2.2 2.4* 2.7* 2.4*  APTT  --   --  51*  --     BMP: Recent Labs    09/09/18 1014 04/12/19 1325 04/13/19 0445 04/14/19 0452  NA 140 138 140 139  K 4.0 3.9 3.5 3.8  CL 107 103 107 105  CO2 24 23 24 25   GLUCOSE 252* 356* 291* 162*  BUN 27* 24* 24* 18  CALCIUM 9.1 8.6* 8.1* 8.2*  CREATININE 1.29* 1.11* 1.26* 1.02*  GFRNONAA 39* 47* 40* 52*  GFRAA 45* 54* 47* >60    LIVER FUNCTION TESTS: Recent Labs    06/03/18 1109 06/30/18 1043 09/09/18 1014 04/12/19  1325  BILITOT 0.5 0.9 0.7 1.2  AST 43* 57* 70*  33  ALT 42* 60* 64* 23  ALKPHOS 136* 130* 138* 89  PROT 6.7 7.3 7.3 7.2  ALBUMIN 3.8 3.5 3.5 3.3*     Assessment and Plan:  A follow-up CT of the chest, abdomen and pelvis was performed on 03/23/2019.  This demonstrated a smaller ablation defect at the upper pole of the right kidney at the level of the ablated papillary carcinoma with no evidence of tumor recurrence.  Abdominal and retroperitoneal lymphadenopathy was felt to be similar to the prior study on 09/16/2018.  Dr. Benay Spice has planned for continued observation and will be following up with Kaylee Mullins.  The CA 19-9 from 05/02/2019, however, does show significant elevation and was 5,571 compared to 515 on 09/09/2018.   Electronically Signed: Azzie Roup 05/18/2019, 12:55 PM   I spent a total of 15 Minutes in remote  clinical consultation, greater than 50% of which was counseling/coordinating care post ablation of a right renal carcinoma.    Visit type: Audio only (telephone). Audio (no video) only due to patient's lack of internet/smartphone capability. Alternative for in-person consultation at [facility], [address], [place], [place]. This visit type was conducted due to national recommendations for restrictions regarding the COVID-19 Pandemic (e.g. social distancing).  This format is felt to be most appropriate for this patient at this time.  All issues noted in this document were discussed and addressed.

## 2019-05-22 ENCOUNTER — Telehealth: Payer: Self-pay | Admitting: *Deleted

## 2019-05-22 ENCOUNTER — Telehealth: Payer: Self-pay | Admitting: Oncology

## 2019-05-22 ENCOUNTER — Other Ambulatory Visit: Payer: Self-pay | Admitting: Nurse Practitioner

## 2019-05-22 DIAGNOSIS — C241 Malignant neoplasm of ampulla of Vater: Secondary | ICD-10-CM

## 2019-05-22 NOTE — Telephone Encounter (Signed)
Scheduled appt per 10/05 sch message- pt aware of appt

## 2019-05-22 NOTE — Telephone Encounter (Signed)
Per [name] NP, called to inform pt of increased tumor makers, and made aware of upcoming lab and office visit approxiatemly within 1 month. Pt verbalized understanding

## 2019-05-24 ENCOUNTER — Other Ambulatory Visit: Payer: Self-pay

## 2019-05-24 ENCOUNTER — Encounter (HOSPITAL_BASED_OUTPATIENT_CLINIC_OR_DEPARTMENT_OTHER): Payer: Medicare Other | Attending: Physician Assistant | Admitting: Physician Assistant

## 2019-05-24 DIAGNOSIS — Z85528 Personal history of other malignant neoplasm of kidney: Secondary | ICD-10-CM | POA: Diagnosis not present

## 2019-05-24 DIAGNOSIS — Z8673 Personal history of transient ischemic attack (TIA), and cerebral infarction without residual deficits: Secondary | ICD-10-CM | POA: Insufficient documentation

## 2019-05-24 DIAGNOSIS — S8012XA Contusion of left lower leg, initial encounter: Secondary | ICD-10-CM | POA: Diagnosis present

## 2019-05-24 DIAGNOSIS — I110 Hypertensive heart disease with heart failure: Secondary | ICD-10-CM | POA: Diagnosis not present

## 2019-05-24 DIAGNOSIS — I87301 Chronic venous hypertension (idiopathic) without complications of right lower extremity: Secondary | ICD-10-CM | POA: Insufficient documentation

## 2019-05-24 DIAGNOSIS — Z923 Personal history of irradiation: Secondary | ICD-10-CM | POA: Insufficient documentation

## 2019-05-24 DIAGNOSIS — E1040 Type 1 diabetes mellitus with diabetic neuropathy, unspecified: Secondary | ICD-10-CM | POA: Insufficient documentation

## 2019-05-24 DIAGNOSIS — I509 Heart failure, unspecified: Secondary | ICD-10-CM | POA: Insufficient documentation

## 2019-05-24 DIAGNOSIS — Z85850 Personal history of malignant neoplasm of thyroid: Secondary | ICD-10-CM | POA: Insufficient documentation

## 2019-05-24 DIAGNOSIS — X58XXXA Exposure to other specified factors, initial encounter: Secondary | ICD-10-CM | POA: Insufficient documentation

## 2019-05-24 NOTE — Progress Notes (Addendum)
INGE, DOENGES ([id number]) Visit Report for 05/24/2019 Chief Complaint Document Details Patient Name: Date of Service: Kaylee Mullins, Kaylee Mullins 05/24/2019 2:45 PM Medical Record [id number] Patient Account Number: [id number] Date of Birth/Sex: Treating RN: [birth year] (83 y.o. Elam Dutch Primary Care Provider: Nolene Ebbs Other Clinician: Sandre Kitty Referring Provider: Treating Provider/Extender:Stone III, Margarita Sermons in Treatment: 5 Information Obtained from: Patient Chief Complaint 04/18/2019; patient is here for review of a hematoma on the right anterior lower leg Electronic Signature(s) Signed: 05/24/2019 2:39:29 PM By: Worthy Keeler PA-C Entered By: Worthy Keeler on 05/24/2019 14:39:29 -------------------------------------------------------------------------------- HPI Details Patient Name: Date of Service: Kaylee Mullins, Kaylee Mullins 05/24/2019 2:45 PM Medical Record [id number] Patient Account Number: [id number] Date of Birth/Sex: Treating RN: 05-13-1936 (83 y.o. 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 On August 6 she traumatized her right leg while getting in a vehicle. Subsequently she was felt to have cellulitis around this area and received courses of Keflex and then doxycycline. She was admitted to hospital from 8/26 through 8/28. There was concern she might have an abscess. An MRI was ordered which showed 2 fluid collections in the superficial subcutaneous fat of the anterior distal lower leg the larger deeper collection measured 2.2 x 6.2 x 7.4 while the smaller area measured 1 x 4.3 x 3. Signal intensities were consistent with a  hematoma. X-rays showed no acute osseous abnormality. Lab work during this admission showed an INR of 2.4 hemoglobin of 10.4. She was discharged leaving this open to air. She saw general surgery who did not think this should be evacuated. Past medical history; this is actually extensive she has had a Whipple procedure, biliary stent, type 2 diabetes, history of DVT on Coumadin, multiple DVT rule out studies from 1016 through April 2019, thyroid cancer, congestive heart failure, hypertension. ABI on the right in our clinic was noncompressible 05/03/2019 on evaluation today patient's right lower extremity does not appear to be draining at this time which is good news. With that being said I feel like that it is only a matter time before this is likely going to begin draining she does appear to have a significant hematoma at this time. However right now there is no drainage. 05/10/2019 on evaluation today patient actually appears to be doing quite well with regard to her right lower extremity. We still really do not have a wound she has a significant hematoma although it appears to be reabsorbing to some degree I think the wrap is helping in this regard. I do not really feel like it is any closer to opening today as compared to last week which is good news still I am a little apprehensive about just saying she is out of the water at this point in that regard. Nonetheless we will continue to monitor this for little bit longer. 05/17/2019 on evaluation today patient actually appears to be doing excellent with regard to her right lower extremity. Nothing has opened and in fact the area seems to be toughening up to the point I do not think anything is going to open up at this time. The hematoma does seem to be slowly resolving and again as I explained to the patient this can take 9 months to a year sometimes  her can even be scarring and it never fully resolves but nonetheless I do think it is time for [place] to  switch to something like an Ace wrap at this point. 05/24/2019 on evaluation today patient appears to be doing well with regard to her right lower extremity. She still has the hematoma but again it still is not showing any signs of active infection or drainage at this point. Fortunately everything seems to be doing well she left the wrap on the entire week since we last saw her. I think will be best for her to take this off and use some lotion as her skin is very dry Electronic Signature(s) Signed: 05/24/2019 4:13:23 PM By: Worthy Keeler PA-C Entered By: Worthy Keeler on 05/24/2019 16:13:23 -------------------------------------------------------------------------------- Physical Exam Details Patient Name: Date of Service: Kaylee Mullins, Kaylee Mullins 05/24/2019 2:45 PM Medical Record [id number] Patient Account Number: [id number] Date of Birth/Sex: Treating RN: 12/30/1935 (83 y.o. Elam Dutch Primary Care Provider: Nolene Ebbs Other Clinician: Sandre Kitty Referring Provider: Treating Provider/Extender:Stone III, Kenyon Ana, Judd Lien in Treatment: 5 Constitutional Well-nourished and well-hydrated in no acute distress. Respiratory normal breathing without difficulty. clear to auscultation bilaterally. Cardiovascular regular rate and rhythm with normal S1, S2. Psychiatric this patient is able to make decisions and demonstrates good insight into disease process. Alert and Oriented x 3. pleasant and cooperative. Notes There is nothing open at this time as far as the area overlying the hematoma is concerned. I am thinking at this point that she needs to actually switch to using a wrap during the day utilizing the Ace wrap and then at bedtime take this off and put some lotion on she can also put additional lotion on when she gets out of the shower. With that being said that should help the quality of her skin a little bit better since right now it is very dry. Fortunately  nothing appears to be draining however. Electronic Signature(s) Signed: 05/24/2019 4:14:38 PM By: Worthy Keeler PA-C Entered By: Worthy Keeler on 05/24/2019 16:14:38 -------------------------------------------------------------------------------- Physician Orders Details Patient Name: Date of Service: Kaylee Mullins, Kaylee Mullins 05/24/2019 2:45 PM Medical Record [id number] Patient Account Number: [id number] Date of Birth/Sex: Treating RN: 1935-09-21 (83 y.o. Elam Dutch Primary Care Provider: Nolene Ebbs Other Clinician: Sandre Kitty Referring Provider: Treating Provider/Extender:Stone III, Margarita Sermons in Treatment: 5 Verbal / Phone Orders: No Diagnosis Coding ICD-10 Coding Code Description S80.11XD Contusion of right lower leg, subsequent encounter I87.301 Chronic venous hypertension (idiopathic) without complications of right lower extremity Follow-up Appointments Return Appointment in 2 weeks. Dressing Change Frequency Change dressing every day. Skin Barriers/Peri-Wound Care Moisturizing lotion - to right leg daily Wound Cleansing May shower and wash wound with soap and water. Edema Control Avoid standing for long periods of time Elevate legs to the level of the heart or above for 30 minutes daily and/or when sitting, a frequency of: - 3-4 times per day Exercise regularly Other: - ace wrap apply in am and removed at bedtime daily Electronic Signature(s) Signed: 05/25/2019 12:09:12 PM By: Baruch Gouty RN, BSN Signed: 05/28/2019 10:37:03 PM By: Worthy Keeler PA-C Entered By: Baruch Gouty on 05/24/2019 16:12:33 -------------------------------------------------------------------------------- Problem List Details Patient Name: Date of Service: Kaylee Mullins 05/24/2019 2:45 PM Medical Record [id number] Patient Account Number: [id number] Date of Birth/Sex: Treating RN: 1935-10-15 (83 y.o. Elam Dutch Primary Care Provider:  Nolene Ebbs Other Clinician: Sandre Kitty Referring Provider: Treating Provider/Extender:Stone III, Kenyon Ana, Judd Lien in Treatment: 5  Active Problems ICD-10 Evaluated Encounter Code Description Active Date Today Diagnosis S80.11XD Contusion of right lower leg, subsequent encounter 04/18/2019 No Yes I87.301 Chronic venous hypertension (idiopathic) without [id number] No Yes complications of right lower extremity Inactive Problems Resolved Problems Electronic Signature(s) Signed: 05/24/2019 2:39:24 PM By: Worthy Keeler PA-C Entered By: Worthy Keeler on 05/24/2019 14:39:24 -------------------------------------------------------------------------------- Progress Note Details Patient Name: Date of Service: Kaylee Mullins, Kaylee Mullins 05/24/2019 2:45 PM Medical Record [id number] Patient Account Number: [id number] Date of Birth/Sex: Treating RN: 1935/10/22 (83 y.o. 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 On August 6 she traumatized her right leg while getting in a vehicle. Subsequently she was felt to have cellulitis around this area and received courses of Keflex and then doxycycline. She was admitted to hospital from 8/26 through 8/28. There was concern she might have an abscess. An MRI was ordered which showed 2 fluid collections in the superficial subcutaneous fat of the anterior distal lower leg the larger deeper collection measured 2.2 x 6.2 x 7.4 while the smaller area measured 1 x 4.3 x 3. Signal intensities were consistent with a  hematoma. X-rays showed no acute osseous abnormality. Lab work during this admission showed an INR of 2.4 hemoglobin of 10.4. She was discharged leaving this open to air. She saw general surgery who did not think this should be evacuated. Past medical history; this is actually extensive she has had a Whipple procedure, biliary stent, type 2 diabetes, history of DVT on Coumadin, multiple DVT rule out studies from 1016 through April 2019, thyroid cancer, congestive heart failure, hypertension. ABI on the right in our clinic was noncompressible 05/03/2019 on evaluation today patient's right lower extremity does not appear to be draining at this time which is good news. With that being said I feel like that it is only a matter time before this is likely going to begin draining she does appear to have a significant hematoma at this time. However right now there is no drainage. 05/10/2019 on evaluation today patient actually appears to be doing quite well with regard to her right lower extremity. We still really do not have a wound she has a significant hematoma although it appears to be reabsorbing to some degree I think the wrap is helping in this regard. I do not really feel like it is any closer to opening today as compared to last week which is good news still I am a little apprehensive about just saying she is out of the water at this point in that regard. Nonetheless we will continue to monitor this for little bit longer. 05/17/2019 on evaluation today patient actually appears to be doing excellent with regard to her right lower extremity. Nothing has opened and in fact the area seems to be toughening up to the point I do not think anything is going to open up at this time. The hematoma does seem to be slowly resolving and again as I explained to the patient this can take 9 months to a year sometimes her can even be scarring and it never fully resolves but nonetheless I do think it is time for [place] to  switch to something like an Ace wrap at this point. 05/24/2019 on evaluation today patient appears to be doing well with regard to her right lower extremity. She still has the hematoma but again it still is not showing any signs of active infection or drainage at this point. Fortunately everything seems to be doing well she left the wrap on the entire week since we last saw her. I think will be best for her to take this off and use some lotion as her skin is very dry Patient History Information obtained from Patient. Family History Cancer - Siblings, Diabetes - Siblings, Heart Disease - Siblings,Mother,Father, Hypertension - Siblings,Father,Mother, No family history of Hereditary Spherocytosis, Kidney Disease, Lung Disease, Seizures, Stroke, Thyroid Problems, Tuberculosis. Social History Never smoker, Marital Status - Widowed, Alcohol Use - Never, Drug Use - No History, Caffeine Use - Never. Medical History Eyes Patient has history of Cataracts - removed Denies history of Glaucoma, Optic Neuritis Ear/Nose/Mouth/Throat Denies history of Chronic sinus problems/congestion, Middle ear problems Hematologic/Lymphatic Patient has history of Lymphedema Denies history of Anemia, Hemophilia, Human Immunodeficiency Virus, Sickle Cell Disease Respiratory Denies history of Aspiration, Asthma, Chronic Obstructive Pulmonary Disease (COPD), Pneumothorax, Sleep Apnea, Tuberculosis Cardiovascular Patient has history of Congestive Heart Failure, Deep Vein Thrombosis, Hypertension, Peripheral Venous Disease Denies history of Angina, Arrhythmia, Hypotension, Myocardial Infarction, Peripheral Arterial Disease, Phlebitis, Vasculitis Gastrointestinal Denies history of Cirrhosis , Colitis, Crohnoos, Hepatitis A, Hepatitis B, Hepatitis C Endocrine Patient has history of Type I Diabetes Denies history of Type II Diabetes Genitourinary Denies history of End Stage Renal Disease Immunological Denies history  of Lupus Erythematosus, Raynaudoos, Scleroderma Integumentary (Skin) Denies history of History of Burn Musculoskeletal Patient has history of Rheumatoid Arthritis Denies history of Gout, Osteoarthritis, Osteomyelitis Neurologic Patient has history of Neuropathy Denies history of Dementia, Quadriplegia, Paraplegia, Seizure Disorder Oncologic Patient has history of Received Radiation - 2005 thyroid Psychiatric Patient has history of Confinement Anxiety Denies history of Anorexia/bulimia Hospitalization/Surgery History - Thyroid Ca left side 1960. - Thyroid Ca right side 2005, CVA, radiation. - Whipple, bilary stent 2018, right kidney Ca. Medical And Surgical History Notes Constitutional Symptoms (General Health) CVA- 2005 Genitourinary right kidney malignant surgery whipple procedure and biliary stent 2018 Musculoskeletal spinal stenosis Oncologic Thyroid removed left side 1960; right side 2005 Malignant in remission. Right kidney malignant surgery whipple procedure and biliary stent 2018. Review of Systems (ROS) Constitutional Symptoms (General Health) Denies complaints or symptoms of Fatigue, Fever, Chills, Marked Weight Change. Respiratory Denies complaints or symptoms of Chronic or frequent coughs, Shortness of Breath. Cardiovascular Denies complaints or symptoms of Chest pain. Psychiatric Denies complaints or symptoms of Claustrophobia, Suicidal. Objective Constitutional Well-nourished and well-hydrated in no acute distress. Vitals Time Taken: 3:36 PM, Temperature: 98.0 F, Pulse: 56 bpm, Respiratory Rate: 18 breaths/min, Blood Pressure: 197/96 mmHg, Capillary Blood Glucose: 160 mg/dl. General Notes: glucose per pt report Respiratory normal breathing without difficulty. clear to auscultation bilaterally. Cardiovascular regular rate and rhythm with normal S1, S2. Psychiatric this patient is able to make decisions and demonstrates good insight into disease process.  Alert and Oriented x 3. pleasant and cooperative. General Notes: There is nothing open at this time as far as the area overlying the hematoma is concerned. I am thinking at this point that she needs to actually switch to using a wrap during the day utilizing the Ace wrap and then at bedtime take this off and put some lotion on she can also put additional lotion on when she gets out of  the shower. With that being said that should help the quality of her skin a little bit better since right now it is very dry. Fortunately nothing appears to be draining however. Assessment Active Problems ICD-10 Contusion of right lower leg, subsequent encounter Chronic venous hypertension (idiopathic) without complications of right lower extremity Plan Follow-up Appointments: Return Appointment in 2 weeks. Dressing Change Frequency: Change dressing every day. Skin Barriers/Peri-Wound Care: Moisturizing lotion - to right leg daily Wound Cleansing: May shower and wash wound with soap and water. Edema Control: Avoid standing for long periods of time Elevate legs to the level of the heart or above for 30 minutes daily and/or when sitting, a frequency of: - 3-4 times per day Exercise regularly Other: - ace wrap apply in am and removed at bedtime daily 1. I would recommend that she continue with the Ace wrap at this time. Fortunately things seem to be doing well in that regard. 2. I am going to suggest she take this off at night however and use some lotion at that time she can also put additional lotion on when she gets out of the shower in the morning and I think she can take a shower as normal just washing the area very lightly. We will see patient back for reevaluation in 2 weeks here in the clinic. If anything worsens or changes patient will contact our office for additional recommendations. If she is doing well at the additional follow-up in 2 weeks I am going to consider going ahead discharge and at  that point as I believe she will likely be out of the woods as far as having to worry about this opening. Electronic Signature(s) Signed: 05/24/2019 4:15:05 PM By: Worthy Keeler PA-C Entered By: Worthy Keeler on 05/24/2019 16:15:05 -------------------------------------------------------------------------------- HxROS Details Patient Name: Date of Service: Kaylee Mullins, Kaylee Mullins 05/24/2019 2:45 PM Medical Record [id number] Patient Account Number: [id number] Date of Birth/Sex: Treating RN: 03/16/1936 (83 y.o. Elam Dutch Primary Care Provider: Nolene Ebbs Other Clinician: Sandre Kitty Referring Provider: Treating Provider/Extender:Stone III, Margarita Sermons in Treatment: 5 Information Obtained From Patient Constitutional Symptoms (General Health) Complaints and Symptoms: Negative for: Fatigue; Fever; Chills; Marked Weight Change Medical History: Past Medical History Notes: CVA- 2005 Respiratory Complaints and Symptoms: Negative for: Chronic or frequent coughs; Shortness of Breath Medical History: Negative for: Aspiration; Asthma; Chronic Obstructive Pulmonary Disease (COPD); Pneumothorax; Sleep Apnea; Tuberculosis Cardiovascular Complaints and Symptoms: Negative for: Chest pain Medical History: Positive for: Congestive Heart Failure; Deep Vein Thrombosis; Hypertension; Peripheral Venous Disease Negative for: Angina; Arrhythmia; Hypotension; Myocardial Infarction; Peripheral Arterial Disease; Phlebitis; Vasculitis Psychiatric Complaints and Symptoms: Negative for: Claustrophobia; Suicidal Medical History: Positive for: Confinement Anxiety Negative for: Anorexia/bulimia Eyes Medical History: Positive for: Cataracts - removed Negative for: Glaucoma; Optic Neuritis Ear/Nose/Mouth/Throat Medical History: Negative for: Chronic sinus problems/congestion; Middle ear problems Hematologic/Lymphatic Medical History: Positive for:  Lymphedema Negative for: Anemia; Hemophilia; Human Immunodeficiency Virus; Sickle Cell Disease Gastrointestinal Medical History: Negative for: Cirrhosis ; Colitis; Crohns; Hepatitis A; Hepatitis B; Hepatitis C Endocrine Medical History: Positive for: Type I Diabetes Negative for: Type II Diabetes Time with diabetes: 21 years Treated with: Insulin Blood sugar tested every day: Yes Tested : BID Genitourinary Medical History: Negative for: End Stage Renal Disease Past Medical History Notes: right kidney malignant surgery whipple procedure and biliary stent 2018 Immunological Medical History: Negative for: Lupus Erythematosus; Raynauds; Scleroderma Integumentary (Skin) Medical History: Negative for: History of Burn Musculoskeletal Medical History: Positive for: Rheumatoid Arthritis Negative for:  Gout; Osteoarthritis; Osteomyelitis Past Medical History Notes: spinal stenosis Neurologic Medical History: Positive for: Neuropathy Negative for: Dementia; Quadriplegia; Paraplegia; Seizure Disorder Oncologic Medical History: Positive for: Received Radiation - 2005 thyroid Past Medical History Notes: Thyroid removed left side 1960; right side 2005 Malignant in remission. Right kidney malignant surgery whipple procedure and biliary stent 2018. HBO Extended History Items Eyes: Cataracts Immunizations Pneumococcal Vaccine: Received Pneumococcal Vaccination: Yes Implantable Devices No devices added Hospitalization / Surgery History Type of Hospitalization/Surgery Thyroid Ca left side 1960 Thyroid Ca right side 2005, CVA, radiation Whipple, bilary stent 2018, right kidney Ca Family and Social History Cancer: Yes - Siblings; Diabetes: Yes - Siblings; Heart Disease: Yes - Siblings,Mother,Father; Hereditary Spherocytosis: No; Hypertension: Yes - Siblings,Father,Mother; Kidney Disease: No; Lung Disease: No; Seizures: No; Stroke: No; Thyroid Problems: No; Tuberculosis: No; Never  smoker; Marital Status - Widowed; Alcohol Use: Never; Drug Use: No History; Caffeine Use: Never; Financial Concerns: No; Food, Clothing or Shelter Needs: No; Support System Lacking: No; Transportation Concerns: No Physician Affirmation I have reviewed and agree with the above information. Electronic Signature(s) Signed: 05/25/2019 12:09:12 PM By: Baruch Gouty RN, BSN Signed: 05/28/2019 10:37:03 PM By: Worthy Keeler PA-C Entered By: Worthy Keeler on 05/24/2019 16:14:04 -------------------------------------------------------------------------------- SuperBill Details Patient Name: Date of Service: Kaylee Mullins, Kaylee Mullins 05/24/2019 Medical Record [id number] Patient Account Number: [id number] Date of Birth/Sex: Treating RN: Jan 20, 1936 (83 y.o. Elam Dutch Primary Care Provider: Nolene Ebbs Other Clinician: Sandre Kitty Referring Provider: Treating Provider/Extender:Stone III, Kenyon Ana, Judd Lien in Treatment: 5 Diagnosis Coding ICD-10 Codes Code Description S80.11XD Contusion of right lower leg, subsequent encounter I87.301 Chronic venous hypertension (idiopathic) without complications of right lower extremity Facility Procedures CPT4 Code: [id number] Description: [phone] - WOUND CARE VISIT-LEV 2 EST PT Modifier: Quantity: 1 Physician Procedures CPT4 Code Description: [id number] - WC PHYS LEVEL 4 - EST PT ICD-10 Diagnosis Description S80.11XD Contusion of right lower leg, subsequent encounter I87.301 Chronic venous hypertension (idiopathic) without compli extremity Modifier: cations of right Quantity: 1 lower Electronic Signature(s) Signed: 05/24/2019 4:15:24 PM By: Worthy Keeler PA-C Entered By: Worthy Keeler on 05/24/2019 16:15:23

## 2019-05-30 NOTE — Progress Notes (Signed)
Kaylee Mullins, Kaylee Mullins ([id number]) Visit Report for 05/24/2019 Arrival Information Details Patient Name: Date of Service: Kaylee Mullins, Kaylee Mullins 05/24/2019 2:45 PM Medical Record [id number] Patient Account Number: [id number] Date of Birth/Sex: Treating RN: 1935-09-02 (83 y.o. [name] Primary Care Makia Bossi: Nolene Ebbs Other Clinician: Sandre Kitty Referring Rio Taber: Treating Kristi Norment/Extender:Stone III, Margarita Sermons in Treatment: 5 Visit Information History Since Last Visit Added or deleted any medications: No Patient Arrived: Gilford Rile Any new allergies or adverse reactions: No Arrival Time: 15:32 Had a fall or experienced change in No Accompanied By: alone activities of daily living that may affect Transfer Assistance: None risk of falls: Patient Identification Verified: Yes Signs or symptoms of abuse/neglect since last No Secondary Verification Process Yes visito Completed: Hospitalized since last visit: No Patient Requires Transmission- No Implantable device outside of the clinic excluding No Based Precautions: cellular tissue based products placed in the center Patient Has Alerts: Yes since last visit: Patient Alerts: Patient on Blood Has Dressing in Place as Prescribed: Yes Thinner Pain Present Now: Yes Electronic Signature(s) Signed: 05/30/2019 5:52:09 PM By: Levan Hurst RN, BSN Entered By: Levan Hurst on 05/24/2019 15:33:52 -------------------------------------------------------------------------------- Clinic Level of Care Assessment Details Patient Name: Date of Service: Kaylee Mullins, Kaylee Mullins 05/24/2019 2:45 PM Medical Record [id number] Patient Account Number: [id number] Date of Birth/Sex: Treating RN: Jul 04, 1936 (83 y.o. Elam Dutch Primary Care Oliwia Berzins: Nolene Ebbs Other Clinician: Sandre Kitty Referring Rebeckah Masih: Treating Fartun Paradiso/Extender:Stone III, Kenyon Ana, Judd Lien in Treatment: 5 Clinic Level  of Care Assessment Items TOOL 4 Quantity Score _0  - Use when only an EandM is performed on FOLLOW-UP visit 0 ASSESSMENTS - Nursing Assessment / Reassessment X - Reassessment of Co-morbidities (includes updates in patient status) 1 10 X - Reassessment of Adherence to Treatment Plan 1 5 ASSESSMENTS - Wound and Skin Assessment / Reassessment _1  - Simple Wound Assessment / Reassessment - one wound 0 _2  - Complex Wound Assessment / Reassessment - multiple wounds 0 X - Dermatologic / Skin Assessment (not related to wound area) 1 10 ASSESSMENTS - Focused Assessment X - Circumferential Edema Measurements - multi extremities 1 5 _3  - Nutritional Assessment / Counseling / Intervention 0 X - Lower Extremity Assessment (monofilament, tuning fork, pulses) 1 5 _4  - Peripheral Arterial Disease Assessment (using hand held doppler) 0 ASSESSMENTS - Ostomy and/or Continence Assessment and Care _5  - Incontinence Assessment and Management 0 _6  - Ostomy Care Assessment and Management (repouching, etc.) 0 PROCESS - Coordination of Care X - Simple Patient / Family Education for ongoing care 1 15 _7  - Complex (extensive) Patient / Family Education for ongoing care 0 X - Staff obtains Programmer, systems, Records, Test Results / Process Orders 1 10 _8  - Staff telephones HHA, Nursing Homes / Clarify orders / etc 0 _9  - Routine Transfer to another Facility (non-emergent condition) 0 _10  - Routine Hospital Admission (non-emergent condition) 0 _11  - New Admissions / Biomedical engineer / Ordering NPWT, Apligraf, etc. 0 _12  - Emergency Hospital Admission (emergent condition) 0 X - Simple Discharge Coordination 1 10 _13  - Complex (extensive) Discharge Coordination 0 PROCESS - Special Needs _14  - Pediatric / Minor Patient Management 0 _15  - Isolation Patient Management 0 _16  - Hearing / Language / Visual special needs 0 _17  - Assessment of Community assistance (transportation, D/C planning, etc.) 0 _18  - Additional assistance /  Altered mentation 0 _19  - Support Surface(s) Assessment (bed, cushion, seat, etc.) 0 INTERVENTIONS - Wound Cleansing / Measurement _20  - Simple Wound Cleansing - one wound 0 _21  -  Complex Wound Cleansing - multiple wounds 0 _0  - Wound Imaging (photographs - any number of wounds) 0 _1  - Wound Tracing (instead of photographs) 0 _2  - Simple Wound Measurement - one wound 0 _3  - Complex Wound Measurement - multiple wounds 0 INTERVENTIONS - Wound Dressings _4  - Small Wound Dressing one or multiple wounds 0 _5  - Medium Wound Dressing one or multiple wounds 0 _6  - Large Wound Dressing one or multiple wounds 0 <EXBMWUXLKGMWNUUV>_2<\/ZDGUYQIHKVQQVZDG>_3  - Application of Medications - topical 0 <OVFIEPPIRJJOACZY>_6<\/AYTKZSWFUXNATFTD>_3  - Application of Medications - injection 0 INTERVENTIONS - Miscellaneous _9  - External ear exam 0 _10  - Specimen Collection (cultures, biopsies, blood, body fluids, etc.) 0 _11  - Specimen(s) / Culture(s) sent or taken to Lab for analysis 0 _12  - Patient Transfer (multiple staff / Civil Service fast streamer / Similar devices) 0 _13  - Simple Staple / Suture removal (25 or less) 0 _14  - Complex Staple / Suture removal (26 or more) 0 _15  - Hypo / Hyperglycemic Management (close monitor of Blood Glucose) 0 _16  - Ankle / Brachial Index (ABI) - do not check if billed separately 0 X - Vital Signs 1 5 Has the patient been seen at the hospital within the last three years: Yes Total Score: 75 Level Of Care: New/Established - Level 2 Electronic Signature(s) Signed: 05/25/2019 12:09:12 PM By: Baruch Gouty RN, BSN Entered By: Baruch Gouty on 05/24/2019 16:13:48 -------------------------------------------------------------------------------- Encounter Discharge Information Details Patient Name: Date of Service: Kaylee Mullins 05/24/2019 2:45 PM Medical Record [id number] Patient Account Number: [id number] Date of Birth/Sex: Treating RN: 07-Dec-1935 (83 y.o. Debby Bud Primary Care Gianah Batt: Nolene Ebbs Other Clinician: Sandre Kitty Referring  Telicia Hodgkiss: Treating Willia Genrich/Extender:Stone III, Margarita Sermons in Treatment: 5 Encounter Discharge Information Items Discharge Condition: Stable Ambulatory Status: Walker Discharge Destination: Home Transportation: Private Auto Accompanied By: self Schedule Follow-up Appointment: Yes Clinical Summary of Care: Electronic Signature(s) Signed: 05/24/2019 6:07:42 PM By: Deon Pilling Entered By: Deon Pilling on 05/24/2019 16:35:27 -------------------------------------------------------------------------------- Lower Extremity Assessment Details Patient Name: Date of Service: Kaylee Mullins, Kaylee Mullins 05/24/2019 2:45 PM Medical Record [id number] Patient Account Number: [id number] Date of Birth/Sex: Treating RN: 10-Sep-1935 (83 y.o. [name] Primary Care Chantee Cerino: Nolene Ebbs Other Clinician: Sandre Kitty Referring Angelys Yetman: Treating Duff Pozzi/Extender:Stone III, Margarita Sermons in Treatment: 5 Edema Assessment Assessed: [Left: No] [Right: No] Edema: [Left: Ye] [Right: s] Calf Left: Right: Point of Measurement: 30 cm From Medial Instep cm 41.2 cm Ankle Left: Right: Point of Measurement: 7 cm From Medial Instep cm 27.5 cm Vascular Assessment Pulses: Dorsalis Pedis Palpable: [Right:Yes] Electronic Signature(s) Signed: 05/30/2019 5:52:09 PM By: Levan Hurst RN, BSN Entered By: Levan Hurst on 05/24/2019 15:34:58 -------------------------------------------------------------------------------- Multi-Disciplinary Care Plan Details Patient Name: Date of Service: Kaylee Mullins 05/24/2019 2:45 PM Medical Record [id number] Patient Account Number: [id number] Date of Birth/Sex: Treating RN: 10/28/1935 (82 y.o. Elam Dutch Primary Care Yakira Duquette: Nolene Ebbs Other Clinician: Sandre Kitty Referring Telecia Larocque: Treating Raylee Adamec/Extender:Stone III, Kenyon Ana, Judd Lien in Treatment: 5 Active Inactive Wound/Skin  Impairment Nursing Diagnoses: Knowledge deficit related to ulceration/compromised skin integrity Goals: Patient/caregiver will verbalize understanding of skin care regimen Date Initiated: 04/18/2019 Target Resolution Date: 05/19/2019 Goal Status: Active Ulcer/skin breakdown will have a volume reduction of 30% by week 4 Date Initiated: 04/18/2019 Date Inactivated: 05/24/2019 Target Resolution Date: 05/19/2019 Goal Status: Met Interventions: Assess patient/caregiver ability to obtain necessary supplies Assess patient/caregiver ability to perform ulcer/skin care regimen upon admission and as needed Assess ulceration(s) every visit Notes: Electronic Signature(s) Signed: 05/25/2019 12:09:12  PM By: Baruch Gouty RN, BSN Entered By: Baruch Gouty on 05/24/2019 16:10:49 -------------------------------------------------------------------------------- Pain Assessment Details Patient Name: Date of Service: Kaylee Mullins, Kaylee Mullins 05/24/2019 2:45 PM Medical Record [id number] Patient Account Number: [id number] Date of Birth/Sex: Treating RN: 07-14-1936 (83 y.o. [name] Primary Care Shaquilla Kehres: Nolene Ebbs Other Clinician: Sandre Kitty Referring Gracia Saggese: Treating Merrissa Giacobbe/Extender:Stone III, Margarita Sermons in Treatment: 5 Active Problems Location of Pain Severity and Description of Pain Patient Has Paino Yes Site Locations Rate the pain. Current Pain Level: 3 Character of Pain Describe the Pain: Tender Pain Management and Medication Current Pain Management: Medication: No Cold Application: No Rest: No Massage: No Activity: No T.E.N.S.: No Heat Application: No Leg drop or elevation: No Is the Current Pain Management Adequate: Adequate How does your wound impact your activities of daily livingo Sleep: No Bathing: No Appetite: No Relationship With Others: No Bladder Continence: No Emotions: No Bowel Continence: No Work: No Toileting: No Drive:  No Dressing: No Hobbies: No Electronic Signature(s) Signed: 05/30/2019 5:52:09 PM By: Levan Hurst RN, BSN Entered By: Levan Hurst on 05/24/2019 15:34:09 -------------------------------------------------------------------------------- Patient/Caregiver Education Details Patient Name: Date of Service: Kaylee Mullins 10/7/2020andnbsp2:45 PM Medical Record Patient Account Number: [id number] Number: Treating RN: Baruch Gouty Date of Birth/Gender: [birth year] (83 y.o. F) Other Clinician: Sandre Kitty Primary Care Physician: Corky Crafts Referring Physician: Physician/Extender: Loma Sender in Treatment: 5 Education Assessment Education Provided To: Patient Education Topics Provided Wound/Skin Impairment: Methods: Explain/Verbal Responses: Reinforcements needed, State content correctly Electronic Signature(s) Signed: 05/25/2019 12:09:12 PM By: Baruch Gouty RN, BSN Entered By: Baruch Gouty on 05/24/2019 16:11:04 -------------------------------------------------------------------------------- [place] Details Patient Name: Date of Service: Kaylee Mullins 05/24/2019 2:45 PM Medical Record [id number] Patient Account Number: [id number] Date of Birth/Sex: Treating RN: [birth year] (82 y.o. [name] Primary Care Shalicia Craghead: Nolene Ebbs Other Clinician: Sandre Kitty Referring Ziyan Schoon: Treating Lemon Whitacre/Extender:Stone III, Margarita Sermons in Treatment: 5 Vital Signs Time Taken: 15:36 Temperature (F): 98.0 Pulse (bpm): 56 Respiratory Rate (breaths/min): 18 Blood Pressure (mmHg): 197/96 Capillary Blood Glucose (mg/dl): 160 Reference Range: 80 - 120 mg / dl Notes glucose per pt report Electronic Signature(s) Signed: 05/30/2019 5:52:09 PM By: Levan Hurst RN, BSN Entered By: Levan Hurst on 05/24/2019 15:37:09

## 2019-06-07 ENCOUNTER — Other Ambulatory Visit: Payer: Self-pay

## 2019-06-07 ENCOUNTER — Encounter (HOSPITAL_BASED_OUTPATIENT_CLINIC_OR_DEPARTMENT_OTHER): Payer: Medicare Other | Admitting: Physician Assistant

## 2019-06-07 DIAGNOSIS — S8012XA Contusion of left lower leg, initial encounter: Secondary | ICD-10-CM | POA: Diagnosis not present

## 2019-06-07 NOTE — Progress Notes (Signed)
LOVELYNN, RILEY ([id number]) Visit Report for 06/07/2019 Chief Complaint Document Details Patient Name: Date of Service: Kaylee, Mullins 06/07/2019 2:45 PM Medical Record [id number] Patient Account Number: [id number] Date of Birth/Sex: Treating RN: 15-Mar-1936 (83 y.o. Kaylee Mullins Primary Care Provider: Nolene Mullins Other Clinician: Referring Provider: Treating Provider/Extender:Stone III, Kaylee Mullins in Treatment: 7 Information Obtained from: Patient Chief Complaint 04/18/2019; patient is here for review of a hematoma on the right anterior lower leg Electronic Signature(s) Signed: 06/07/2019 6:13:43 PM By: Worthy Keeler PA-C Entered By: Worthy Keeler on 06/07/2019 15:55:43 -------------------------------------------------------------------------------- HPI Details Patient Name: Date of Service: Kaylee, Mullins 06/07/2019 2:45 PM Medical Record [id number] Patient Account Number: [id number] Date of Birth/Sex: Treating RN: 01/25/1936 (83 y.o. 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 On August 6 she traumatized her right leg while getting in a vehicle. Subsequently she was felt to have cellulitis around this area and received courses of Keflex and then doxycycline. She was admitted to hospital from 8/26 through 8/28. There was concern she might have an abscess. An MRI was ordered which showed 2 fluid collections in the superficial subcutaneous fat of the anterior distal lower leg the larger deeper collection measured 2.2 x 6.2 x 7.4 while the smaller area measured 1 x 4.3 x 3. Signal intensities were consistent with a hematoma. X-rays showed no  acute osseous abnormality. Lab work during this admission showed an INR of 2.4 hemoglobin of 10.4. She was discharged leaving this open to air. She saw general surgery who did not think this should be evacuated. Past medical history; this is actually extensive she has had a Whipple procedure, biliary stent, type 2 diabetes, history of DVT on Coumadin, multiple DVT rule out studies from 1016 through April 2019, thyroid cancer, congestive heart failure, hypertension. ABI on the right in our clinic was noncompressible 05/03/2019 on evaluation today patient's right lower extremity does not appear to be draining at this time which is good news. With that being said I feel like that it is only a matter time before this is likely going to begin draining she does appear to have a significant hematoma at this time. However right now there is no drainage. 05/10/2019 on evaluation today patient actually appears to be doing quite well with regard to her right lower extremity. We still really do not have a wound she has a significant hematoma although it appears to be reabsorbing to some degree I think the wrap is helping in this regard. I do not really feel like it is any closer to opening today as compared to last week which is good news still I am a little apprehensive about just saying she is out of the water at this point in that regard. Nonetheless we will continue to monitor this for little bit longer. 05/17/2019 on evaluation today patient actually appears to be doing excellent with regard to her right lower extremity. Nothing has opened and in fact the area seems to be toughening up to the point I do not think anything is going to open up at this time. The hematoma does seem to be slowly resolving and again as I explained to the patient this can take 9 months to a year sometimes her can even be  scarring and it never fully resolves but nonetheless I do think it is time for [place] to switch to something like  an Ace wrap at this point. 05/24/2019 on evaluation today patient appears to be doing well with regard to her right lower extremity. She still has the hematoma but again it still is not showing any signs of active infection or drainage at this point. Fortunately everything seems to be doing well she left the wrap on the entire week since we last saw her. I think will be best for her to take this off and use some lotion as her skin is very dry 06/07/2019 on evaluation today patient actually appears to be doing well with regard to her lower extremity on the right. She still has a hematoma which is resolving but nothing is open the skin appears to be doing excellent and overall very pleased with how things are progressing. No fevers, chills, nausea, vomiting, or diarrhea. Electronic Signature(s) Signed: 06/07/2019 6:13:43 PM By: Worthy Keeler PA-C Entered By: Worthy Keeler on 06/07/2019 16:05:55 -------------------------------------------------------------------------------- Physical Exam Details Patient Name: Date of Service: Kaylee, Mullins 06/07/2019 2:45 PM Medical Record [id number] Patient Account Number: [id number] Date of Birth/Sex: Treating RN: 1935-12-26 (83 y.o. Kaylee Mullins Primary Care Provider: Nolene Mullins Other Clinician: Referring Provider: Treating Provider/Extender:Stone III, Kaylee Mullins in Treatment: 7 Constitutional Well-nourished and well-hydrated in no acute distress. Respiratory normal breathing without difficulty. clear to auscultation bilaterally. Cardiovascular regular rate and rhythm with normal S1, S2. Psychiatric this patient is able to make decisions and demonstrates good insight into disease process. Alert and Oriented x 3. pleasant and cooperative. Notes Patient's hematoma currently showed signs of no imminent risk of opening I think she is doing very well and the skin appears to be excellent in quality overall the  hematoma does seem to be resolving although she is taking quite a bit of time. There is no signs of infection and overall I think she is out of the woods as far as having to worry about anything worsening at this time I do think still using the Ace wrap during the day she has been doing or her compression stocking is appropriate obviously I think she can take this off at night. Electronic Signature(s) Signed: 06/07/2019 6:13:43 PM By: Worthy Keeler PA-C Entered By: Worthy Keeler on 06/07/2019 16:06:38 -------------------------------------------------------------------------------- Physician Orders Details Patient Name: Date of Service: Kaylee, Mullins 06/07/2019 2:45 PM Medical Record [id number] Patient Account Number: [id number] Date of Birth/Sex: Treating RN: 11/24/1935 (83 y.o. Kaylee Mullins Primary Care Provider: Nolene Mullins Other Clinician: Referring Provider: Treating Provider/Extender:Stone III, Kaylee Mullins in Treatment: 7 Verbal / Phone Orders: No Diagnosis Coding ICD-10 Coding Code Description S80.11XD Contusion of right lower leg, subsequent encounter I87.301 Chronic venous hypertension (idiopathic) without complications of right lower extremity Discharge From [facility] Services Discharge from [facility] D[facility] Other: - can continue to wrap with ACE wrap during the day, remove at night Electronic Signature(s) Signed: 06/07/2019 6:13:43 PM By: Worthy Keeler PA-C Signed: 06/07/2019 6:50:57 PM By: Levan Hurst RN, BSN Entered By: Levan Hurst on 06/07/2019 16:01:22 -------------------------------------------------------------------------------- Problem List Details Patient Name: Date of Service: Kaylee Mullins 06/07/2019 2:45 PM Medical Record [id number] Patient Account Number: [id number] Date of Birth/Sex: Treating RN: 1936-03-26 (83 y.o. Kaylee Mullins Primary Care Provider: Nolene Mullins Other  Clinician: Referring Provider: Treating Provider/Extender:Stone III, Kaylee Mullins in Treatment: 7 Active Problems ICD-10 Evaluated  Encounter Code Description Active Date Today Diagnosis S80.11XD Contusion of right lower leg, subsequent encounter 04/18/2019 No Yes I87.301 Chronic venous hypertension (idiopathic) without [id number] No Yes complications of right lower extremity Inactive Problems Resolved Problems Electronic Signature(s) Signed: 06/07/2019 6:13:43 PM By: Worthy Keeler PA-C Entered By: Worthy Keeler on 06/07/2019 15:55:38 -------------------------------------------------------------------------------- Progress Note Details Patient Name: Date of Service: Kaylee, Mullins 06/07/2019 2:45 PM Medical Record [id number] Patient Account Number: [id number] Date of Birth/Sex: Treating RN: 1936-08-12 (83 y.o. 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 On August 6 she traumatized her right leg while getting in a vehicle. Subsequently she was felt to have cellulitis around this area and received courses of Keflex and then doxycycline. She was admitted to hospital from 8/26 through 8/28. There was concern she might have an abscess. An MRI was ordered which showed 2 fluid collections in the superficial subcutaneous fat of the anterior distal lower leg the larger deeper collection measured 2.2 x 6.2 x 7.4 while the smaller area measured 1 x 4.3 x 3. Signal intensities were consistent with a hematoma. X-rays showed no acute osseous abnormality.  Lab work during this admission showed an INR of 2.4 hemoglobin of 10.4. She was discharged leaving this open to air. She saw general surgery who did not think this should be evacuated. Past medical history; this is actually extensive she has had a Whipple procedure, biliary stent, type 2 diabetes, history of DVT on Coumadin, multiple DVT rule out studies from 1016 through April 2019, thyroid cancer, congestive heart failure, hypertension. ABI on the right in our clinic was noncompressible 05/03/2019 on evaluation today patient's right lower extremity does not appear to be draining at this time which is good news. With that being said I feel like that it is only a matter time before this is likely going to begin draining she does appear to have a significant hematoma at this time. However right now there is no drainage. 05/10/2019 on evaluation today patient actually appears to be doing quite well with regard to her right lower extremity. We still really do not have a wound she has a significant hematoma although it appears to be reabsorbing to some degree I think the wrap is helping in this regard. I do not really feel like it is any closer to opening today as compared to last week which is good news still I am a little apprehensive about just saying she is out of the water at this point in that regard. Nonetheless we will continue to monitor this for little bit longer. 05/17/2019 on evaluation today patient actually appears to be doing excellent with regard to her right lower extremity. Nothing has opened and in fact the area seems to be toughening up to the point I do not think anything is going to open up at this time. The hematoma does seem to be slowly resolving and again as I explained to the patient this can take 9 months to a year sometimes her can even be scarring and it never fully resolves but nonetheless I do think it is time for [place] to switch to something like an Ace  wrap at this  point. 05/24/2019 on evaluation today patient appears to be doing well with regard to her right lower extremity. She still has the hematoma but again it still is not showing any signs of active infection or drainage at this point. Fortunately everything seems to be doing well she left the wrap on the entire week since we last saw her. I think will be best for her to take this off and use some lotion as her skin is very dry 06/07/2019 on evaluation today patient actually appears to be doing well with regard to her lower extremity on the right. She still has a hematoma which is resolving but nothing is open the skin appears to be doing excellent and overall very pleased with how things are progressing. No fevers, chills, nausea, vomiting, or diarrhea. Patient History Information obtained from Patient. Family History Cancer - Siblings, Diabetes - Siblings, Heart Disease - Siblings,Mother,Father, Hypertension - Siblings,Father,Mother, No family history of Hereditary Spherocytosis, Kidney Disease, Lung Disease, Seizures, Stroke, Thyroid Problems, Tuberculosis. Social History Never smoker, Marital Status - Widowed, Alcohol Use - Never, Drug Use - No History, Caffeine Use - Never. Medical History Eyes Patient has history of Cataracts - removed Denies history of Glaucoma, Optic Neuritis Ear/Nose/Mouth/Throat Denies history of Chronic sinus problems/congestion, Middle ear problems Hematologic/Lymphatic Patient has history of Lymphedema Denies history of Anemia, Hemophilia, Human Immunodeficiency Virus, Sickle Cell Disease Respiratory Denies history of Aspiration, Asthma, Chronic Obstructive Pulmonary Disease (COPD), Pneumothorax, Sleep Apnea, Tuberculosis Cardiovascular Patient has history of Congestive Heart Failure, Deep Vein Thrombosis, Hypertension, Peripheral Venous Disease Denies history of Angina, Arrhythmia, Hypotension, Myocardial Infarction, Peripheral Arterial Disease,  Phlebitis, Vasculitis Gastrointestinal Denies history of Cirrhosis , Colitis, Crohnoos, Hepatitis A, Hepatitis B, Hepatitis C Endocrine Patient has history of Type I Diabetes Denies history of Type II Diabetes Genitourinary Denies history of End Stage Renal Disease Immunological Denies history of Lupus Erythematosus, Raynaudoos, Scleroderma Integumentary (Skin) Denies history of History of Burn Musculoskeletal Patient has history of Rheumatoid Arthritis Denies history of Gout, Osteoarthritis, Osteomyelitis Neurologic Patient has history of Neuropathy Denies history of Dementia, Quadriplegia, Paraplegia, Seizure Disorder Oncologic Patient has history of Received Radiation - 2005 thyroid Psychiatric Patient has history of Confinement Anxiety Denies history of Anorexia/bulimia Hospitalization/Surgery History - Thyroid Ca left side 1960. - Thyroid Ca right side 2005, CVA, radiation. - Whipple, bilary stent 2018, right kidney Ca. Medical And Surgical History Notes Constitutional Symptoms (General Health) CVA- 2005 Genitourinary right kidney malignant surgery whipple procedure and biliary stent 2018 Musculoskeletal spinal stenosis Oncologic Thyroid removed left side 1960; right side 2005 Malignant in remission. Right kidney malignant surgery whipple procedure and biliary stent 2018. Review of Systems (ROS) Constitutional Symptoms (General Health) Denies complaints or symptoms of Fatigue, Fever, Chills, Marked Weight Change. Respiratory Denies complaints or symptoms of Chronic or frequent coughs, Shortness of Breath. Cardiovascular Denies complaints or symptoms of Chest pain. Psychiatric Denies complaints or symptoms of Claustrophobia, Suicidal. Objective Constitutional Well-nourished and well-hydrated in no acute distress. Vitals Time Taken: 3:22 PM, Temperature: 98.3 F, Pulse: 67 bpm, Respiratory Rate: 18 breaths/min, Blood Pressure: 175/86 mmHg, Capillary Blood  Glucose: 138 mg/dl. Respiratory normal breathing without difficulty. clear to auscultation bilaterally. Cardiovascular regular rate and rhythm with normal S1, S2. Psychiatric this patient is able to make decisions and demonstrates good insight into disease process. Alert and Oriented x 3. pleasant and cooperative. General Notes: Patient's hematoma currently showed signs of no imminent risk of opening I think she is doing very well and the  skin appears to be excellent in quality overall the hematoma does seem to be resolving although she is taking quite a bit of time. There is no signs of infection and overall I think she is out of the woods as far as having to worry about anything worsening at this time I do think still using the Ace wrap during the day she has been doing or her compression stocking is appropriate obviously I think she can take this off at night. Assessment Active Problems ICD-10 Contusion of right lower leg, subsequent encounter Chronic venous hypertension (idiopathic) without complications of right lower extremity Plan Discharge From [facility] Services: Discharge from [place] Primary Wound Dressing: Other: - can continue to wrap with ACE wrap during the day, remove at night 1. At this point I would recommend that we discontinue wound care services since she seems to be doing so well and the patient is in agreement with the plan. 2. I am also going to suggest that she continue with the Ace wrap or her compression stocking to keep things under control as far as the edema is concerned and help the hematoma to resolve even better. 3. I would recommend as well that she continue to monitor the area and if anything changes or worsens she knows to contact the office and let us know. We will see patient back for reevaluation in 1 week here in the clinic. If anything worsens or changes patient will contact our office for additional recommendations. Electronic  Signature(s) Signed: 06/07/2019 6:13:43 PM By: Worthy Keeler PA-C Entered By: Worthy Keeler on 06/07/2019 16:07:38 -------------------------------------------------------------------------------- HxROS Details Patient Name: Date of Service: Kaylee, Mullins 06/07/2019 2:45 PM Medical Record [id number] Patient Account Number: [id number] Date of Birth/Sex: Treating RN: 06/30/1936 (83 y.o. Kaylee Mullins Primary Care Provider: Nolene Mullins Other Clinician: Referring Provider: Treating Provider/Extender:Stone III, Kaylee Mullins in Treatment: 7 Information Obtained From Patient Constitutional Symptoms (General Health) Complaints and Symptoms: Negative for: Fatigue; Fever; Chills; Marked Weight Change Medical History: Past Medical History Notes: CVA- 2005 Respiratory Complaints and Symptoms: Negative for: Chronic or frequent coughs; Shortness of Breath Medical History: Negative for: Aspiration; Asthma; Chronic Obstructive Pulmonary Disease (COPD); Pneumothorax; Sleep Apnea; Tuberculosis Cardiovascular Complaints and Symptoms: Negative for: Chest pain Medical History: Positive for: Congestive Heart Failure; Deep Vein Thrombosis; Hypertension; Peripheral Venous Disease Negative for: Angina; Arrhythmia; Hypotension; Myocardial Infarction; Peripheral Arterial Disease; Phlebitis; Vasculitis Psychiatric Complaints and Symptoms: Negative for: Claustrophobia; Suicidal Medical History: Positive for: Confinement Anxiety Negative for: Anorexia/bulimia Eyes Medical History: Positive for: Cataracts - removed Negative for: Glaucoma; Optic Neuritis Ear/Nose/Mouth/Throat Medical History: Negative for: Chronic sinus problems/congestion; Middle ear problems Hematologic/Lymphatic Medical History: Positive for: Lymphedema Negative for: Anemia; Hemophilia; Human Immunodeficiency Virus; Sickle Cell Disease Gastrointestinal Medical History: Negative for:  Cirrhosis ; Colitis; Crohns; Hepatitis A; Hepatitis B; Hepatitis C Endocrine Medical History: Positive for: Type I Diabetes Negative for: Type II Diabetes Time with diabetes: 21 years Treated with: Insulin Blood sugar tested every day: Yes Tested : BID Genitourinary Medical History: Negative for: End Stage Renal Disease Past Medical History Notes: right kidney malignant surgery whipple procedure and biliary stent 2018 Immunological Medical History: Negative for: Lupus Erythematosus; Raynauds; Scleroderma Integumentary (Skin) Medical History: Negative for: History of Burn Musculoskeletal Medical History: Positive for: Rheumatoid Arthritis Negative for: Gout; Osteoarthritis; Osteomyelitis Past Medical History Notes: spinal stenosis Neurologic Medical History: Positive for: Neuropathy Negative for: Dementia; Quadriplegia; Paraplegia; Seizure Disorder Oncologic Medical History: Positive for: Received Radiation - 2005  thyroid Past Medical History Notes: Thyroid removed left side 1960; right side 2005 Malignant in remission. Right kidney malignant surgery whipple procedure and biliary stent 2018. HBO Extended History Items Eyes: Cataracts Immunizations Pneumococcal Vaccine: Received Pneumococcal Vaccination: Yes Implantable Devices No devices added Hospitalization / Surgery History Type of Hospitalization/Surgery Thyroid Ca left side 1960 Thyroid Ca right side 2005, CVA, radiation Whipple, bilary stent 2018, right kidney Ca Family and Social History Cancer: Yes - Siblings; Diabetes: Yes - Siblings; Heart Disease: Yes - Siblings,Mother,Father; Hereditary Spherocytosis: No; Hypertension: Yes - Siblings,Father,Mother; Kidney Disease: No; Lung Disease: No; Seizures: No; Stroke: No; Thyroid Problems: No; Tuberculosis: No; Never smoker; Marital Status - Widowed; Alcohol Use: Never; Drug Use: No History; Caffeine Use: Never; Financial Concerns: No; Food, Clothing or Shelter  Needs: No; Support System Lacking: No; Transportation Concerns: No Physician Affirmation I have reviewed and agree with the above information. Electronic Signature(s) Signed: 06/07/2019 6:13:43 PM By: Worthy Keeler PA-C Signed: 06/07/2019 6:50:57 PM By: Levan Hurst RN, BSN Entered By: Worthy Keeler on 06/07/2019 16:06:20 -------------------------------------------------------------------------------- SuperBill Details Patient Name: Date of Service: Kaylee, Mullins 06/07/2019 Medical Record [id number] Patient Account Number: [id number] Date of Birth/Sex: Treating RN: [birth year] (83 y.o. Kaylee Mullins Primary Care Provider: Nolene Mullins Other Clinician: Referring Provider: Treating Provider/Extender:Stone III, Kaylee Mullins in Treatment: 7 Diagnosis Coding ICD-10 Codes Code Description S80.11XD Contusion of right lower leg, subsequent encounter I87.301 Chronic venous hypertension (idiopathic) without complications of right lower extremity Facility Procedures CPT4 Code: [id number] Description: [phone] - WOUND CARE VISIT-LEV 2 EST PT Modifier: Quantity: 1 Physician Procedures CPT4 Code Description: [id number] - WC PHYS LEVEL 3 - EST PT ICD-10 Diagnosis Description S80.11XD Contusion of right lower leg, subsequent encounter I87.301 Chronic venous hypertension (idiopathic) without compl extremity Modifier: ications of rig Quantity: 1 ht lower Electronic Signature(s) Signed: 06/07/2019 6:13:43 PM By: Worthy Keeler PA-C Signed: 06/07/2019 6:50:57 PM By: Levan Hurst RN, BSN Entered By: Levan Hurst on 06/07/2019 17:31:53

## 2019-06-21 ENCOUNTER — Inpatient Hospital Stay: Payer: Medicare Other | Attending: Internal Medicine | Admitting: Oncology

## 2019-06-21 ENCOUNTER — Other Ambulatory Visit: Payer: Self-pay

## 2019-06-21 ENCOUNTER — Telehealth: Payer: Self-pay | Admitting: Oncology

## 2019-06-21 ENCOUNTER — Inpatient Hospital Stay: Payer: Medicare Other

## 2019-06-21 VITALS — BP 132/60 | HR 66 | Temp 98.3°F | Resp 17 | Ht 64.0 in | Wt 189.2 lb

## 2019-06-21 DIAGNOSIS — C241 Malignant neoplasm of ampulla of Vater: Secondary | ICD-10-CM | POA: Diagnosis not present

## 2019-06-21 DIAGNOSIS — Z85528 Personal history of other malignant neoplasm of kidney: Secondary | ICD-10-CM | POA: Insufficient documentation

## 2019-06-21 DIAGNOSIS — Z86718 Personal history of other venous thrombosis and embolism: Secondary | ICD-10-CM | POA: Diagnosis not present

## 2019-06-21 DIAGNOSIS — Z8673 Personal history of transient ischemic attack (TIA), and cerebral infarction without residual deficits: Secondary | ICD-10-CM | POA: Diagnosis not present

## 2019-06-21 DIAGNOSIS — M549 Dorsalgia, unspecified: Secondary | ICD-10-CM | POA: Diagnosis not present

## 2019-06-21 DIAGNOSIS — I10 Essential (primary) hypertension: Secondary | ICD-10-CM | POA: Insufficient documentation

## 2019-06-21 DIAGNOSIS — R600 Localized edema: Secondary | ICD-10-CM | POA: Insufficient documentation

## 2019-06-21 DIAGNOSIS — Z7901 Long term (current) use of anticoagulants: Secondary | ICD-10-CM | POA: Diagnosis not present

## 2019-06-21 DIAGNOSIS — E119 Type 2 diabetes mellitus without complications: Secondary | ICD-10-CM | POA: Diagnosis not present

## 2019-06-21 MED ORDER — HYDROCODONE-ACETAMINOPHEN 5-325 MG PO TABS: 1.0000 | ORAL_TABLET | Freq: Two times a day (BID) | ORAL | 0 refills | Status: DC | PRN

## 2019-06-21 NOTE — Telephone Encounter (Signed)
Scheduled per 11/04 los, patient received after visit summary and calender.

## 2019-06-21 NOTE — Progress Notes (Signed)
[place] OFFICE PROGRESS NOTE   Diagnosis: Pancreas cancer  INTERVAL HISTORY:   Kaylee Mullins returns as scheduled.  She saw Dr. Bernerd Pho on 05/18/2007.  A CT on 03/23/2019 revealed no evidence of renal cell carcinoma.  The CA 19-9 was markedly elevated on 05/02/2019.  She continues to have bilateral leg swelling.  She complains of discomfort in the left lower posterior lateral chest.  This pain has been present for at least 6 months.  She uses Voltaren gel and Tylenol as needed.  Objective:  Vital signs in last 24 hours:  Blood pressure 132/60, pulse 66, temperature 98.3 F (36.8 C), temperature source Temporal, resp. rate 17, height 5\' 4"  (1.626 m), weight 189 lb 3.2 oz (85.8 kg), SpO2 100 %.    HEENT: Neck without mass Lymphatics: No cervical, supraclavicular, axillary, or inguinal nodes Resp: Decreased breath sounds with expiratory rhonchi at the left greater than right lower posterior chest, no respiratory distress Cardio: Regular rate and rhythm GI: No mass, nontender, no apparent ascites, no hepatosplenomegaly Vascular: Trace lower leg edema bilaterally, persistent hematoma of the right pretibial region Musculoskeletal: Tender at the left lower posterior lateral chest wall over the ribs, no mass    Lab Results:  Lab Results  Component Value Date   WBC 6.4 04/13/2019   HGB 10.4 (L) 04/13/2019   HCT 32.3 (L) 04/13/2019   MCV 99.1 04/13/2019   PLT 298 04/13/2019   NEUTROABS 4.1 04/12/2019    CMP  Lab Results  Component Value Date   NA 139 04/14/2019   K 3.8 04/14/2019   CL 105 04/14/2019   CO2 25 04/14/2019   GLUCOSE 162 (H) 04/14/2019   BUN 18 04/14/2019   CREATININE 1.02 (H) 04/14/2019   CALCIUM 8.2 (L) 04/14/2019   PROT 7.2 04/12/2019   ALBUMIN 3.3 (L) 04/12/2019   AST 33 04/12/2019   ALT 23 04/12/2019   ALKPHOS 89 04/12/2019   BILITOT 1.2 04/12/2019   GFRNONAA 52 (L) 04/14/2019   GFRAA >60 04/14/2019    Medications: I have reviewed the  patient's current medications.   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 CT abdomen/pelvis 10/23/2016-ampullary mass, single mildly enlarged porta hepatis lymph node, no evidence of distant metastatic disease ? Status post pancreaticoduodenectomy 01/12/2017; pT3pN2 ? CT abdomen/pelvis 03/30/2018-ablation defect within the upper pole of the right kidney. No evidence of recurrent renal mass. New mild left periaortic retroperitoneal lymphadenopathy. ? CT abdomen/pelvis 06/30/2018-6 mm right middle lobe nodule-slowly growing over multiple CTs, stable ablation defect in the right kidney, 1.9 cm left periaortic node increased from 1.2 cm, stable 1.5 cm porta hepatis node ? CT abdomen/pelvis 09/17/2018- surgical changes related to Whipple procedure.  Stable upper abdominal/retroperitoneal lymphadenopathy.  5 mm right middle lobe nodule, stable from most recent CT but mildly progressive from priors.  Postprocedural changes related to renal ablation in the medial right upper kidney. ? CTs 03/24/2019- a few scattered pulmonary nodules appears stable; liver has a slightly shrunken appearance and nodular appearance compatible with mild cirrhosis.  No suspicious cystic or solid hepatic lesions.  Upper abdominal and retroperitoneal lymphadenopathy appears similar compared to the prior study.  No other new lymphadenopathy noted elsewhere in the abdomen or pelvis. 2. Biliary obstruction secondary to #1, status post placement of a metal, bile duct stent on 10/23/2016  3. 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 Renal cell carcinoma-status post ablation of a  right renal mass 07/15/2016, biopsy confirmed papillary renal cell carcinoma, Fuhrman grade 3  5. CVA in 2016  6. History of thyroid cancer-status post thyroidectomy and radioactive iodine in 2005  7. Diabetes   8.Hypertension  9.Left lower extremity deep vein thrombosis June 2019, IVC filter placed July 2018 after she was diagnosed with a rectus hematoma while on Lovenox  10.   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  The CA 19-9 was markedly elevated when she was here in September.  She understands the likelihood of having progressive ampullary carcinoma.  She says she would want to consider treatment options if a diagnosis is confirmed.  We decided to proceed with a staging PET scan.  The pain at the left chest wall may be related to a benign musculoskeletal condition progression of the ampullary carcinoma.  She will try hydrocodone as needed.  Kaylee Mullins will return for an office visit in 2 weeks.  Betsy Coder, MD  06/21/2019  12:26 PM

## 2019-06-24 LAB — CANCER ANTIGEN 19-9: CA 19-9: 9953 U/mL — ABNORMAL HIGH (ref 0–35)

## 2019-07-03 ENCOUNTER — Other Ambulatory Visit: Payer: Self-pay

## 2019-07-03 ENCOUNTER — Ambulatory Visit (HOSPITAL_COMMUNITY)
Admission: RE | Admit: 2019-07-03 | Discharge: 2019-07-03 | Disposition: A | Discharge status: Home / Self Care | Payer: Medicare Other | Source: Ambulatory Visit | Attending: Oncology | Admitting: Oncology

## 2019-07-03 DIAGNOSIS — Z79899 Other long term (current) drug therapy: Secondary | ICD-10-CM | POA: Diagnosis not present

## 2019-07-03 DIAGNOSIS — I10 Essential (primary) hypertension: Secondary | ICD-10-CM | POA: Diagnosis not present

## 2019-07-03 DIAGNOSIS — E119 Type 2 diabetes mellitus without complications: Secondary | ICD-10-CM | POA: Insufficient documentation

## 2019-07-03 DIAGNOSIS — R590 Localized enlarged lymph nodes: Secondary | ICD-10-CM | POA: Insufficient documentation

## 2019-07-03 DIAGNOSIS — Z85850 Personal history of malignant neoplasm of thyroid: Secondary | ICD-10-CM | POA: Insufficient documentation

## 2019-07-03 DIAGNOSIS — C241 Malignant neoplasm of ampulla of Vater: Secondary | ICD-10-CM | POA: Diagnosis not present

## 2019-07-03 LAB — GLUCOSE, CAPILLARY: Glucose-Capillary: 144 mg/dL — ABNORMAL HIGH (ref 70–99)

## 2019-07-03 MED ORDER — FLUDEOXYGLUCOSE F - 18 (FDG) INJECTION
9.8400 | Freq: Once | INTRAVENOUS | Status: AC
  Administered 2019-07-03: 9.84 via INTRAVENOUS

## 2019-07-04 ENCOUNTER — Other Ambulatory Visit: Payer: Self-pay

## 2019-07-04 ENCOUNTER — Inpatient Hospital Stay (HOSPITAL_BASED_OUTPATIENT_CLINIC_OR_DEPARTMENT_OTHER): Payer: Medicare Other | Admitting: Nurse Practitioner

## 2019-07-04 ENCOUNTER — Encounter: Payer: Self-pay | Admitting: Nurse Practitioner

## 2019-07-04 ENCOUNTER — Telehealth: Payer: Self-pay | Admitting: Nurse Practitioner

## 2019-07-04 VITALS — BP 148/67 | HR 56 | Temp 98.0°F | Resp 17 | Ht 64.0 in | Wt 187.5 lb

## 2019-07-04 DIAGNOSIS — C241 Malignant neoplasm of ampulla of Vater: Secondary | ICD-10-CM

## 2019-07-04 NOTE — Progress Notes (Addendum)
[place] OFFICE PROGRESS NOTE   Diagnosis: Ampullary carcinoma  INTERVAL HISTORY:   Kaylee Mullins returns for follow-up.  She continues to have pain at the left low back.  1 Vicodin tablet has not been effective.  She notes some relief with Voltaren gel.  Appetite is unchanged.  Objective:  Vital signs in last 24 hours:  Blood pressure (!) 148/67, pulse (!) 56, temperature 98 F (36.7 C), resp. rate 17, height 5\' 4"  (1.626 m), weight 187 lb 8 oz (85 kg), SpO2 100 %.    GI: Abdomen soft and nontender.  No hepatosplenomegaly. Vascular: Trace edema at the lower legs bilaterally. Neuro: Alert and oriented. Musculoskeletal: Nontender over the left low back. Skin: No rash.   Lab Results:  Lab Results  Component Value Date   WBC 6.4 04/13/2019   HGB 10.4 (L) 04/13/2019   HCT 32.3 (L) 04/13/2019   MCV 99.1 04/13/2019   PLT 298 04/13/2019   NEUTROABS 4.1 04/12/2019    Imaging:  Nm Pet Image Initial (pi) Skull Base To Thigh  Result Date: 07/03/2019 CLINICAL DATA:  Subsequent treatment strategy for ampullary carcinoma. Patient status post pancreaticoduodenectomy on 01/12/2017. T3 N2 lesion. Patient status post renal cell carcinoma with RIGHT renal mass ablation 2017. Additional history of thyroid carcinoma EXAM: NUCLEAR MEDICINE PET SKULL BASE TO THIGH TECHNIQUE: 9.8 mCi F-18 FDG was injected intravenously. Full-ring PET imaging was performed from the skull base to thigh after the radiotracer. CT data was obtained and used for attenuation correction and anatomic localization. Fasting blood glucose: 144 mg/dl COMPARISON:  CT 03/23/2019 FINDINGS: Mediastinal blood pool activity: SUV max 3.2 Liver activity: SUV max NA NECK: No hypermetabolic lymph nodes in the neck. Incidental CT findings: none CHEST: Small RIGHT middle lobe pulmonary nodule stable over multiple comparison exams measuring 4 mm (image 59/4). No associated metabolic activity. No hypermetabolic mediastinal  lymph nodes. Incidental CT findings: Small RIGHT effusion ABDOMEN/PELVIS: New hypermetabolic lymph node in the region of the prior pancreatectomy. Node is just ventral to the IVC and adjacent to the main portal vein measuring 12 mm (image 84/4) with intense metabolic activity (SUV max equal 9.6). Hypermetabolic lesion in the subcapsular central LEFT hepatic lobe SUV max equal 7.1. Lesion difficult to define on the noncontrast CT but measures approximately 1.8 cm. Third region of intense metabolic activity is found LEFT of the aorta and associated with soft tissue thickening adjacent to the LEFT adrenal gland which is ill-defined. Activity appears separate from the adrenal gland favored a lymph node. The tissue measures approximately 1.7 cm (image 96/4) with SUV max equal 7.8 There is moderate activity associated the entirety of the stomach which is favored related gastritis rather than neoplasm. Post- partial gastrectomy consistent with Whipple procedure. Activity in the stomach is intense with SUV max equal 8.1. No additional adenopathy in the abdomen pelvis. Physiologic activity in the bowel. Incidental CT findings: none SKELETON: No focal hypermetabolic activity to suggest skeletal metastasis. Incidental CT findings: none IMPRESSION: 1. Hypermetabolic lymph node adjacent porta hepatis is most consistent with recurrence of ampullary pancreatic carcinoma. 2. New focal hypermetabolic lesion in the liver consistent with solitary hepatic metastasis. 3. Hypermetabolic activity in the LEFT suprarenal location is favored metastatic adenopathy in the retroperitoneum. 4. Diffuse hypermetabolic activity within the stomach is favored gastritis. Malignancy not entirely excluded. Electronically Signed   By: Suzy Bouchard M.D.   On: 07/03/2019 17:09    Medications: I have reviewed the patient's current medications.  Assessment/Plan: 1. Ampullary  carcinoma-ERCP with bile duct brushing and biopsy of a major papilla mass  on 10/16/2016 confirmed adenocarcinoma ? CT abdomen/pelvis 10/23/2016-ampullary mass, single mildly enlarged porta hepatis lymph node, no evidence of distant metastatic disease ? Status post pancreaticoduodenectomy 01/12/2017; pT3pN2 ? CT abdomen/pelvis 03/30/2018-ablation defect within the upper pole of the right kidney. No evidence of recurrent renal mass. New mild left periaortic retroperitoneal lymphadenopathy. ? CT abdomen/pelvis 06/30/2018-6 mm right middle lobe nodule-slowly growing over multiple CTs, stable ablation defect in the right kidney, 1.9 cm left periaortic node increased from 1.2 cm, stable 1.5 cm porta hepatis node ? CT abdomen/pelvis 09/17/2018-surgical changes related to Whipple procedure. Stable upper abdominal/retroperitoneal lymphadenopathy. 5 mm right middle lobe nodule, stable from most recent CT but mildly progressive from priors. Postprocedural changes related to renal ablation in the medial right upper kidney. ? CTs 03/24/2019- a few scattered pulmonary nodules appears stable; liver has a slightly shrunken appearance and nodular appearance compatible with mild cirrhosis.  No suspicious cystic or solid hepatic lesions.  Upper abdominal and retroperitoneal lymphadenopathy appears similar compared to the prior study.  No other new lymphadenopathy noted elsewhere in the abdomen or pelvis. ? PET scan 07/03/2019-hypermetabolic lymph node adjacent porta hepatis.  No focal hypermetabolic lesion in the liver.  Hypermetabolic activity in the left suprarenal location favored metastatic adenopathy in the retroperitoneum.  Diffuse hypermetabolic activity within the stomach favor gastritis. 2. Biliary obstruction secondary to #1, status post placement of a metal, bile duct stent on 10/23/2016  3. 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 Renal cell carcinoma-status post ablation of a right renal mass 07/15/2016, biopsy confirmed papillary renal cell carcinoma,  Fuhrman grade 3  5. CVA in 2016  6. History of thyroid cancer-status post thyroidectomy and radioactive iodine in 2005  7. Diabetes  8.Hypertension  9.Left lower extremity deep vein thrombosis June 2019, IVC filter placed July 2018 after she was diagnosed with a rectus hematoma while on Lovenox  10.   Hospitalized 04/12/2019 through 04/14/2019 with traumatic hematoma right lower leg, followed at the wound clinic  Disposition: Kaylee Mullins appears unchanged.  The recent PET scan shows evidence of metastatic disease involving several areas.  We reviewed the PET scan report/images with Kaylee Mullins and her son at today's visit.  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  There is nothing to explain the pain she is having at the left low back.  She understands the pain may be related to a benign musculoskeletal condition versus the ampullary carcinoma.  Dr. Benay Mullins discussed continued observation versus a trial of chemotherapy.  Kaylee Mullins prefers continued observation.  For the pain she will try 2 of the hydrocodone tablets.  If this is not effective she will contact the office.  She will return for a follow-up visit in 1 month.  Patient seen with Dr. Benay Mullins.  PET scan images reviewed on the computer with Ms. Totzke and her son.  25 minutes were spent face-to-face at today's visit with the majority of that time involved in counseling/coordination of care.    [name] ANP/GNP-BC   07/04/2019  1:08 PM  This was a shared visit with [name].  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  We reviewed the PET images with Ms. Hoberg and her son.  There is no clear explanation for the lower back pain, but the pain may be related to the periaortic mass.  She understands no therapy  will be curative.  We discussed treatment options including trial of systemic  chemotherapy.  She does not wish to begin chemotherapy at present.  She will return for an office visit in 1 month.  [name], MD

## 2019-07-04 NOTE — Telephone Encounter (Signed)
Scheduled appt per 11/17 los.,  Printed calendar and avs.

## 2019-07-07 ENCOUNTER — Other Ambulatory Visit: Payer: Self-pay | Admitting: Nurse Practitioner

## 2019-07-07 DIAGNOSIS — C241 Malignant neoplasm of ampulla of Vater: Secondary | ICD-10-CM

## 2019-07-07 MED ORDER — HYDROCODONE-ACETAMINOPHEN 5-325 MG PO TABS: 1.0000 | ORAL_TABLET | Freq: Two times a day (BID) | ORAL | 0 refills | Status: DC | PRN

## 2019-07-25 NOTE — Progress Notes (Signed)
Kaylee Mullins, Kaylee Mullins ([id number]) Visit Report for 06/07/2019 Arrival Information Details Patient Name: Date of Service: Kaylee Mullins, Kaylee Mullins 06/07/2019 2:45 PM Medical Record [id number] Patient Account Number: [id number] Date of Birth/Sex: Treating RN: January 22, 1936 (83 y.o. [name] Primary Care Aniket Paye: Nolene Ebbs Other Clinician: Referring Jaidy Cottam: Treating Brithney Bensen/Extender:Stone III, Margarita Sermons in Treatment: 7 Visit Information History Since Last Visit Added or deleted any medications: No Patient Arrived: Walker Any new allergies or adverse reactions: No Arrival Time: 15:22 Had a fall or experienced change in No Accompanied By: self activities of daily living that may affect Transfer Assistance: None risk of falls: Patient Identification Verified: Yes Signs or symptoms of abuse/neglect since last No Secondary Verification Process Yes visito Completed: Hospitalized since last visit: No Patient Requires Transmission- No Implantable device outside of the clinic excluding No Based Precautions: cellular tissue based products placed in the center Patient Has Alerts: Yes since last visit: Patient Alerts: Patient on Blood Has Dressing in Place as Prescribed: Yes Thinner Pain Present Now: No Electronic Signature(s) Signed: 07/25/2019 3:02:15 PM By: Sandre Kitty Entered By: Sandre Kitty on 06/07/2019 15:22:25 -------------------------------------------------------------------------------- Clinic Level of Care Assessment Details Patient Name: Date of Service: Kaylee Mullins, Kaylee Mullins 06/07/2019 2:45 PM Medical Record [id number] Patient Account Number: [id number] Date of Birth/Sex: Treating RN: 01/29/1936 (83 y.o. [name] Primary Care Riley Hallum: Nolene Ebbs Other Clinician: Referring Arsenia Goracke: Treating Sindhu Nguyen/Extender:Stone III, Margarita Sermons in Treatment: 7 Clinic Level of Care Assessment Items TOOL 4  Quantity Score X - Use when only an EandM is performed on FOLLOW-UP visit 1 0 ASSESSMENTS - Nursing Assessment / Reassessment X - Reassessment of Co-morbidities (includes updates in patient status) 1 10 X - Reassessment of Adherence to Treatment Plan 1 5 ASSESSMENTS - Wound and Skin Assessment / Reassessment []  - Simple Wound Assessment / Reassessment - one wound 0 []  - Complex Wound Assessment / Reassessment - multiple wounds 0 []  - Dermatologic / Skin Assessment (not related to wound area) 0 ASSESSMENTS - Focused Assessment []  - Circumferential Edema Measurements - multi extremities 0 []  - Nutritional Assessment / Counseling / Intervention 0 X - Lower Extremity Assessment (monofilament, tuning fork, pulses) 1 5 []  - Peripheral Arterial Disease Assessment (using hand held doppler) 0 ASSESSMENTS - Ostomy and/or Continence Assessment and Care []  - Incontinence Assessment and Management 0 []  - Ostomy Care Assessment and Management (repouching, etc.) 0 PROCESS - Coordination of Care X - Simple Patient / Family Education for ongoing care 1 15 []  - Complex (extensive) Patient / Family Education for ongoing care 0 X - Staff obtains Programmer, systems, Records, Test Results / Process Orders 1 10 []  - Staff telephones HHA, Nursing Homes / Clarify orders / etc 0 []  - Routine Transfer to another Facility (non-emergent condition) 0 []  - Routine Hospital Admission (non-emergent condition) 0 []  - New Admissions / Biomedical engineer / Ordering NPWT, Apligraf, etc. 0 []  - Emergency Hospital Admission (emergent condition) 0 X - Simple Discharge Coordination 1 10 []  - Complex (extensive) Discharge Coordination 0 PROCESS - Special Needs []  - Pediatric / Minor Patient Management 0 []  - Isolation Patient Management 0 []  - Hearing / Language / Visual special needs 0 []  - Assessment of Community assistance (transportation, D/C planning, etc.) 0 []  - Additional assistance / Altered mentation 0 []  - Support  Surface(s) Assessment (bed, cushion, seat, etc.) 0 INTERVENTIONS - Wound Cleansing / Measurement []  - Simple Wound Cleansing - one wound 0 []  - Complex Wound Cleansing - multiple  wounds 0 []  - Wound Imaging (photographs - any number of wounds) 0 []  - Wound Tracing (instead of photographs) 0 []  - Simple Wound Measurement - one wound 0 []  - Complex Wound Measurement - multiple wounds 0 INTERVENTIONS - Wound Dressings []  - Small Wound Dressing one or multiple wounds 0 []  - Medium Wound Dressing one or multiple wounds 0 []  - Large Wound Dressing one or multiple wounds 0 []  - Application of Medications - topical 0 []  - Application of Medications - injection 0 INTERVENTIONS - Miscellaneous []  - External ear exam 0 []  - Specimen Collection (cultures, biopsies, blood, body fluids, etc.) 0 []  - Specimen(s) / Culture(s) sent or taken to Lab for analysis 0 []  - Patient Transfer (multiple staff / Civil Service fast streamer / Similar devices) 0 []  - Simple Staple / Suture removal (25 or less) 0 []  - Complex Staple / Suture removal (26 or more) 0 []  - Hypo / Hyperglycemic Management (close monitor of Blood Glucose) 0 []  - Ankle / Brachial Index (ABI) - do not check if billed separately 0 X - Vital Signs 1 5 Has the patient been seen at the hospital within the last three years: Yes Total Score: 60 Level Of Care: New/Established - Level 2 Electronic Signature(s) Signed: 06/07/2019 6:50:57 PM By: Levan Hurst RN, BSN Entered By: Levan Hurst on 06/07/2019 17:31:41 -------------------------------------------------------------------------------- Encounter Discharge Information Details Patient Name: Date of Service: Kaylee Mullins 06/07/2019 2:45 PM Medical Record [id number] Patient Account Number: [id number] Date of Birth/Sex: Treating RN: [birth year] (83 y.o. [name] Primary Care Ramey Ketcherside: Nolene Ebbs Other Clinician: Referring Jamoni Broadfoot: Treating Adnan Vanvoorhis/Extender:Stone III,  Margarita Sermons in Treatment: 7 Encounter Discharge Information Items Discharge Condition: Stable Ambulatory Status: Walker Discharge Destination: Home Transportation: Private Auto Accompanied By: alone Schedule Follow-up Appointment: Yes Clinical Summary of Care: Patient Declined Electronic Signature(s) Signed: 06/07/2019 6:50:57 PM By: Levan Hurst RN, BSN Entered By: Levan Hurst on 06/07/2019 17:32:28 -------------------------------------------------------------------------------- Lower Extremity Assessment Details Patient Name: Date of Service: Kaylee Mullins, Kaylee Mullins 06/07/2019 2:45 PM Medical Record [id number] Patient Account Number: [id number] Date of Birth/Sex: Treating RN: 14-Nov-1935 (82 y.o. [name] Primary Care Allannah Kempen: Nolene Ebbs Other Clinician: Referring Signa Cheek: Treating Sina Sumpter/Extender:Stone III, Margarita Sermons in Treatment: 7 Edema Assessment Assessed: [Left: No] [Right: Yes] Edema: [Left: Ye] [Right: s] Calf Left: Right: Point of Measurement: 30 cm From Medial Instep cm 42.5 cm Ankle Left: Right: Point of Measurement: 7 cm From Medial Instep cm 29 cm Vascular Assessment Pulses: Dorsalis Pedis Palpable: [Right:Yes] Electronic Signature(s) Signed: 06/07/2019 6:26:11 PM By: Deon Pilling Signed: 06/07/2019 6:50:57 PM By: Levan Hurst RN, BSN Entered By: Deon Pilling on 06/07/2019 15:45:42 -------------------------------------------------------------------------------- [place] Details Patient Name: Date of Service: Kaylee Mullins, Kaylee Mullins 06/07/2019 2:45 PM Medical Record [id number] Patient Account Number: [id number] Date of Birth/Sex: Treating RN: 03-05-1936 (83 y.o. [name] Primary Care Tequisha Maahs: Nolene Ebbs Other Clinician: Referring Karan Ramnauth: Treating Janai Maudlin/Extender:Stone III, Margarita Sermons in Treatment: 7 Active Inactive Electronic  Signature(s) Signed: 06/07/2019 6:50:57 PM By: Levan Hurst RN, BSN Entered By: Levan Hurst on 06/07/2019 15:59:41 -------------------------------------------------------------------------------- Pain Assessment Details Patient Name: Date of Service: Kaylee Mullins, Kaylee Mullins 06/07/2019 2:45 PM Medical Record [id number] Patient Account Number: [id number] Date of Birth/Sex: Treating RN: 1936-04-01 (83 y.o. [name] Primary Care Annaliesa Blann: Nolene Ebbs Other Clinician: Referring Damaya Channing: Treating Rashika Bettes/Extender:Stone III, Margarita Sermons in Treatment: 7 Active Problems Location of Pain Severity and Description of Pain Patient Has Paino No Site Locations  Pain Management and Medication Current Pain Management: Electronic Signature(s) Signed: 06/07/2019 6:50:57 PM By: Levan Hurst RN, BSN Signed: 07/25/2019 3:02:15 PM By: Sandre Kitty Entered By: Sandre Kitty on 06/07/2019 15:25:20 -------------------------------------------------------------------------------- Patient/Caregiver Education Details Patient Name: Kaylee Mullins 10/21/2020andnbsp2:45 Date of Service: PM Medical Record [id number] Number: Patient Account Number: [id number] Treating RN: Date of Birth/Gender: [birth year] (82 y.o. [name]) Other Clinician: Primary Care Physician:Avbuere, Elson Clan Worthy Keeler Referring Physician: Physician/Extender: Loma Sender in Treatment: 7 Education Assessment Education Provided To: Patient Education Topics Provided Wound/Skin Impairment: Methods: Explain/Verbal Responses: State content correctly Electronic Signature(s) Signed: 06/07/2019 6:50:57 PM By: Levan Hurst RN, BSN Entered By: Levan Hurst on 06/07/2019 16:00:16 -------------------------------------------------------------------------------- [place] Details Patient Name: Date of Service: Kaylee Mullins 06/07/2019 2:45 PM Medical Record  [id number] Patient Account Number: [id number] Date of Birth/Sex: Treating RN: 1936/07/12 (82 y.o. [name] Primary Care Tashaun Obey: Nolene Ebbs Other Clinician: Referring Maisen Schmit: Treating Cheyla Duchemin/Extender:Stone III, Margarita Sermons in Treatment: 7 Vital Signs Time Taken: 15:22 Temperature (F): 98.3 Pulse (bpm): 67 Respiratory Rate (breaths/min): 18 Blood Pressure (mmHg): 175/86 Capillary Blood Glucose (mg/dl): 138 Reference Range: 80 - 120 mg / dl Electronic Signature(s) Signed: 07/25/2019 3:02:15 PM By: Sandre Kitty Entered By: Sandre Kitty on 06/07/2019 15:25:13

## 2019-08-03 ENCOUNTER — Other Ambulatory Visit: Payer: Self-pay

## 2019-08-03 ENCOUNTER — Inpatient Hospital Stay (HOSPITAL_BASED_OUTPATIENT_CLINIC_OR_DEPARTMENT_OTHER): Payer: Medicare Other | Admitting: Oncology

## 2019-08-03 ENCOUNTER — Inpatient Hospital Stay: Payer: Medicare Other | Attending: Internal Medicine

## 2019-08-03 VITALS — BP 171/101 | HR 93 | Temp 97.8°F | Resp 16 | Ht 64.0 in | Wt 185.4 lb

## 2019-08-03 DIAGNOSIS — R109 Unspecified abdominal pain: Secondary | ICD-10-CM | POA: Diagnosis not present

## 2019-08-03 DIAGNOSIS — K831 Obstruction of bile duct: Secondary | ICD-10-CM | POA: Diagnosis not present

## 2019-08-03 DIAGNOSIS — C779 Secondary and unspecified malignant neoplasm of lymph node, unspecified: Secondary | ICD-10-CM | POA: Diagnosis not present

## 2019-08-03 DIAGNOSIS — Z8673 Personal history of transient ischemic attack (TIA), and cerebral infarction without residual deficits: Secondary | ICD-10-CM | POA: Diagnosis not present

## 2019-08-03 DIAGNOSIS — Z85528 Personal history of other malignant neoplasm of kidney: Secondary | ICD-10-CM | POA: Diagnosis not present

## 2019-08-03 DIAGNOSIS — Z86718 Personal history of other venous thrombosis and embolism: Secondary | ICD-10-CM | POA: Insufficient documentation

## 2019-08-03 DIAGNOSIS — E119 Type 2 diabetes mellitus without complications: Secondary | ICD-10-CM | POA: Insufficient documentation

## 2019-08-03 DIAGNOSIS — K862 Cyst of pancreas: Secondary | ICD-10-CM | POA: Insufficient documentation

## 2019-08-03 DIAGNOSIS — Z79899 Other long term (current) drug therapy: Secondary | ICD-10-CM | POA: Insufficient documentation

## 2019-08-03 DIAGNOSIS — Z7901 Long term (current) use of anticoagulants: Secondary | ICD-10-CM | POA: Insufficient documentation

## 2019-08-03 DIAGNOSIS — R110 Nausea: Secondary | ICD-10-CM | POA: Insufficient documentation

## 2019-08-03 DIAGNOSIS — C241 Malignant neoplasm of ampulla of Vater: Secondary | ICD-10-CM

## 2019-08-03 DIAGNOSIS — I10 Essential (primary) hypertension: Secondary | ICD-10-CM | POA: Insufficient documentation

## 2019-08-03 MED ORDER — OXYCODONE-ACETAMINOPHEN 5-325 MG PO TABS: 1.0000 | ORAL_TABLET | Freq: Three times a day (TID) | ORAL | 0 refills | Status: DC | PRN

## 2019-08-03 MED ORDER — ONDANSETRON HCL 8 MG PO TABS: 8.0000 mg | ORAL_TABLET | Freq: Three times a day (TID) | ORAL | 0 refills | Status: AC | PRN

## 2019-08-03 NOTE — Progress Notes (Signed)
[facility] Mull[facility] OFFICE PROGRESS NOTE   Diagnosis: Ampullary carcinoma  INTERVAL HISTORY:   Kaylee Mullins returns for a scheduled visit.  She continues to have pain in the left abdomen and flank areas.  She also feels discomfort at the left anterior iliac.  The pain is partially relieved with hydrocodone.  She describes the pain is constant.  She has intermittent nausea related to the pain.  Her appetite is poor.  She is having bowel movements.  Objective:  Vital signs in last 24 hours:  Blood pressure (!) 171/101, pulse 93, temperature 97.8 F (36.6 C), temperature source Temporal, resp. rate 16, height 5\' 4"  (1.626 m), weight 185 lb 6.4 oz (84.1 kg), SpO2 100 %.    Limited physical examination secondary to distancing with the Covid pandemic GI: No mass, nontender, no hepatosplenomegaly Vascular: Chronic skin changes at the lower leg bilaterally Musculoskeletal: No tenderness at the left flank.  Mild tenderness at the left anterior iliac  Portacath/PICC-without erythema  Lab Results:  Lab Results  Component Value Date   WBC 6.4 04/13/2019   HGB 10.4 (L) 04/13/2019   HCT 32.3 (L) 04/13/2019   MCV 99.1 04/13/2019   PLT 298 04/13/2019   NEUTROABS 4.1 04/12/2019    CMP  Lab Results  Component Value Date   NA 139 04/14/2019   K 3.8 04/14/2019   CL 105 04/14/2019   CO2 25 04/14/2019   GLUCOSE 162 (H) 04/14/2019   BUN 18 04/14/2019   CREATININE 1.02 (H) 04/14/2019   CALCIUM 8.2 (L) 04/14/2019   PROT 7.2 04/12/2019   ALBUMIN 3.3 (L) 04/12/2019   AST 33 04/12/2019   ALT 23 04/12/2019   ALKPHOS 89 04/12/2019   BILITOT 1.2 04/12/2019   GFRNONAA 52 (L) 04/14/2019   GFRAA >60 04/14/2019     Medications: I have reviewed the patient's current medications.   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 CT abdomen/pelvis 10/23/2016-ampullary mass, single mildly  enlarged porta hepatis lymph node, no evidence of distant metastatic disease ? Status post pancreaticoduodenectomy 01/12/2017; pT3pN2 ? CT abdomen/pelvis 03/30/2018-ablation defect within the upper pole of the right kidney. No evidence of recurrent renal mass. New mild left periaortic retroperitoneal lymphadenopathy. ? CT abdomen/pelvis 06/30/2018-6 mm right middle lobe nodule-slowly growing over multiple CTs, stable ablation defect in the right kidney, 1.9 cm left periaortic node increased from 1.2 cm, stable 1.5 cm porta hepatis node ? CT abdomen/pelvis 09/17/2018-surgical changes related to Whipple procedure. Stable upper abdominal/retroperitoneal lymphadenopathy. 5 mm right middle lobe nodule, stable from most recent CT but mildly progressive from priors. Postprocedural changes related to renal ablation in the medial right upper kidney. ? CTs 03/24/2019- a few scattered pulmonary nodules appears stable; liver has a slightly shrunken appearance and nodular appearance compatible with mild cirrhosis.  No suspicious cystic or solid hepatic lesions.  Upper abdominal and retroperitoneal lymphadenopathy appears similar compared to the prior study.  No other new lymphadenopathy noted elsewhere in the abdomen or pelvis. ? PET scan 07/03/2019-hypermetabolic lymph node adjacent porta hepatis.  New focal hypermetabolic lesion in the liver.  Hypermetabolic activity in the left suprarenal location favored metastatic adenopathy in the retroperitoneum.  Diffuse hypermetabolic activity within the stomach favor gastritis. 2. Biliary obstruction secondary to #1, status post placement of a metal, bile duct stent on 10/23/2016  3. 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 Renal cell carcinoma-status post ablation of a right renal  mass 07/15/2016, biopsy confirmed papillary renal cell carcinoma, Fuhrman grade 3  5. CVA in 2016  6. History of thyroid cancer-status post thyroidectomy and  radioactive iodine in 2005  7. Diabetes  8.Hypertension  9.Left lower extremity deep vein thrombosis June 2019, IVC filter placed July 2018 after she was diagnosed with a rectus hematoma while on Lovenox  10.   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  She has pain in the left abdomen/flank region, likely secondary to disease progression in the left retroperitoneal soft tissue, and potentially the stomach.  She will begin a trial of oxycodone for pain.  We discussed comfort care versus a trial of systemic chemotherapy.  She understands no therapy will be curative.  We discussed the goal of chemotherapy is to palliate her pain and potentially prolong survival.  We discussed potential toxicities associated with gemcitabine/Abraxane regimen including the chance for nausea, alopecia, and hematologic toxicity.  We discussed the fever, rash, pneumonitis, and phlebitis associated with gemcitabine.  We discussed the potential for neuropathy and alopecia with Abraxane.    Kaylee Mullins would like to discuss the chemotherapy option further with her daughter.  She will return for an office visit, chemotherapy class, and further discussion on 08/21/2018.  We will refer her for Port-A-Cath placement if she decides to begin chemotherapy.  Betsy Coder, MD  08/03/2019  3:57 PM

## 2019-08-04 LAB — CANCER ANTIGEN 19-9: CA 19-9: 9006 U/mL — ABNORMAL HIGH (ref 0–35)

## 2019-08-07 ENCOUNTER — Telehealth: Payer: Self-pay | Admitting: Oncology

## 2019-08-07 NOTE — Telephone Encounter (Signed)
Scheduled per los. Called and spoke to patient. Confirmed appt  °

## 2019-08-22 ENCOUNTER — Other Ambulatory Visit: Payer: Self-pay

## 2019-08-22 ENCOUNTER — Telehealth: Payer: Self-pay | Admitting: Nurse Practitioner

## 2019-08-22 ENCOUNTER — Inpatient Hospital Stay: Payer: Medicare Other

## 2019-08-22 ENCOUNTER — Encounter: Payer: Self-pay | Admitting: Nurse Practitioner

## 2019-08-22 ENCOUNTER — Inpatient Hospital Stay: Payer: Medicare Other | Attending: Internal Medicine | Admitting: Nurse Practitioner

## 2019-08-22 ENCOUNTER — Other Ambulatory Visit: Payer: Self-pay | Admitting: *Deleted

## 2019-08-22 VITALS — BP 102/61 | HR 104 | Temp 97.4°F | Resp 17 | Ht 64.0 in | Wt 182.9 lb

## 2019-08-22 DIAGNOSIS — R110 Nausea: Secondary | ICD-10-CM | POA: Insufficient documentation

## 2019-08-22 DIAGNOSIS — K5900 Constipation, unspecified: Secondary | ICD-10-CM | POA: Insufficient documentation

## 2019-08-22 DIAGNOSIS — G893 Neoplasm related pain (acute) (chronic): Secondary | ICD-10-CM | POA: Diagnosis not present

## 2019-08-22 DIAGNOSIS — Z5111 Encounter for antineoplastic chemotherapy: Secondary | ICD-10-CM | POA: Insufficient documentation

## 2019-08-22 DIAGNOSIS — K7460 Unspecified cirrhosis of liver: Secondary | ICD-10-CM | POA: Insufficient documentation

## 2019-08-22 DIAGNOSIS — E890 Postprocedural hypothyroidism: Secondary | ICD-10-CM | POA: Insufficient documentation

## 2019-08-22 DIAGNOSIS — R590 Localized enlarged lymph nodes: Secondary | ICD-10-CM | POA: Insufficient documentation

## 2019-08-22 DIAGNOSIS — Z8673 Personal history of transient ischemic attack (TIA), and cerebral infarction without residual deficits: Secondary | ICD-10-CM | POA: Diagnosis not present

## 2019-08-22 DIAGNOSIS — C241 Malignant neoplasm of ampulla of Vater: Secondary | ICD-10-CM

## 2019-08-22 DIAGNOSIS — Z85528 Personal history of other malignant neoplasm of kidney: Secondary | ICD-10-CM | POA: Diagnosis not present

## 2019-08-22 DIAGNOSIS — R600 Localized edema: Secondary | ICD-10-CM | POA: Insufficient documentation

## 2019-08-22 DIAGNOSIS — E119 Type 2 diabetes mellitus without complications: Secondary | ICD-10-CM | POA: Insufficient documentation

## 2019-08-22 DIAGNOSIS — I10 Essential (primary) hypertension: Secondary | ICD-10-CM | POA: Diagnosis not present

## 2019-08-22 DIAGNOSIS — Z7189 Other specified counseling: Secondary | ICD-10-CM | POA: Diagnosis not present

## 2019-08-22 DIAGNOSIS — Z85850 Personal history of malignant neoplasm of thyroid: Secondary | ICD-10-CM | POA: Insufficient documentation

## 2019-08-22 MED ORDER — PROCHLORPERAZINE MALEATE 5 MG PO TABS: 5.0000 mg | ORAL_TABLET | Freq: Four times a day (QID) | ORAL | 3 refills | Status: DC | PRN

## 2019-08-22 MED ORDER — LIDOCAINE-PRILOCAINE 2.5-2.5 % EX CREA: TOPICAL_CREAM | CUTANEOUS | 3 refills | Status: AC

## 2019-08-22 MED ORDER — OXYCODONE-ACETAMINOPHEN 5-325 MG PO TABS: 1.0000 | ORAL_TABLET | Freq: Once | ORAL | Status: DC

## 2019-08-22 MED ORDER — OXYCODONE-ACETAMINOPHEN 5-325 MG PO TABS
ORAL_TABLET | ORAL | Status: AC
  Filled 2019-08-22: qty 1

## 2019-08-22 MED ORDER — OXYCODONE-ACETAMINOPHEN 5-325 MG PO TABS
1.0000 | ORAL_TABLET | Freq: Once | ORAL | Status: AC
  Administered 2019-08-22: 16:00:00 1 via ORAL

## 2019-08-22 NOTE — Telephone Encounter (Signed)
Scheduled apptper 1/5 los - gave patient AVS and calender

## 2019-08-22 NOTE — Progress Notes (Addendum)
[place] OFFICE PROGRESS NOTE   Diagnosis: Ampullary carcinoma  INTERVAL HISTORY:   Kaylee Mullins returns as scheduled.  She continues to have back pain.  She takes 1-1/2 Percocet tablets maybe once a day with partial relief.  She notes constipation.  Last bowel movement 2 days ago.  She is having periodic nausea.  Objective:  Vital signs in last 24 hours:  Blood pressure 102/61, pulse (!) 104, temperature (!) 97.4 F (36.3 C), temperature source Temporal, resp. rate 17, height 5\' 4"  (1.626 m), weight 182 lb 14.4 oz (83 kg), SpO2 100 %.    HEENT: No thrush or ulcers. GI: Abdomen soft and nontender.  No hepatomegaly. Vascular: Trace edema at the lower legs bilaterally, chronic stasis change. Neuro: Alert and oriented.    Lab Results:  Lab Results  Component Value Date   WBC 6.4 04/13/2019   HGB 10.4 (L) 04/13/2019   HCT 32.3 (L) 04/13/2019   MCV 99.1 04/13/2019   PLT 298 04/13/2019   NEUTROABS 4.1 04/12/2019    Imaging:  No results found.  Medications: I have reviewed the patient's current medications.  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 CT abdomen/pelvis 10/23/2016-ampullary mass, single mildly enlarged porta hepatis lymph node, no evidence of distant metastatic disease ? Status post pancreaticoduodenectomy 01/12/2017; pT3pN2 ? CT abdomen/pelvis 03/30/2018-ablation defect within the upper pole of the right kidney. No evidence of recurrent renal mass. New mild left periaortic retroperitoneal lymphadenopathy. ? CT abdomen/pelvis 06/30/2018-6 mm right middle lobe nodule-slowly growing over multiple CTs, stable ablation defect in the right kidney, 1.9 cm left periaortic node increased from 1.2 cm, stable 1.5 cm porta hepatis node ? CT abdomen/pelvis 09/17/2018-surgical changes related to Whipple procedure. Stable upper abdominal/retroperitoneal  lymphadenopathy. 5 mm right middle lobe nodule, stable from most recent CT but mildly progressive from priors. Postprocedural changes related to renal ablation in the medial right upper kidney. ? CTs 03/24/2019-a few scattered pulmonary nodules appears stable; liver has a slightly shrunken appearance and nodular appearance compatible with mild cirrhosis. No suspicious cystic or solid hepatic lesions. Upper abdominal and retroperitoneal lymphadenopathy appears similar compared to the prior study. No other new lymphadenopathy noted elsewhere in the abdomen or pelvis. ? PET scan 07/03/2019-hypermetabolic lymph node adjacent porta hepatis.  New focal hypermetabolic lesion in the liver.  Hypermetabolic activity in the left suprarenal location favored metastatic adenopathy in the retroperitoneum.  Diffuse hypermetabolic activity within the stomach favor gastritis. 2. Biliary obstruction secondary to #1, status post placement of a metal, bile duct stent on 10/23/2016  3. 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 Renal cell carcinoma-status post ablation of a right renal mass 07/15/2016, biopsy confirmed papillary renal cell carcinoma, Fuhrman grade 3  5. CVA in 2016  6. History of thyroid cancer-status post thyroidectomy and radioactive iodine in 2005  7. Diabetes  8.Hypertension  9.Left lower extremity deep vein thrombosis June 2019, IVC filter placed July 2018 after she was diagnosed with a rectus hematoma while on Lovenox  10. 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  She is symptomatic with pain.  She has decided to proceed with a trial of systemic chemotherapy.  We reviewed potential toxicities associated with chemotherapy including bone marrow toxicity, nausea, hair loss, allergic reaction.  We discussed  potential toxicities associated with gemcitabine including  fever, rash, pneumonitis.  We reviewed potential toxicities associated with gemcitabine including neuropathy.  She agrees to proceed.  She is attending a chemotherapy education class today.  We referred her for placement of a Port-A-Cath.  Prescription sent to her pharmacy for Compazine and EMLA cream.  For the constipation she will begin daily MiraLAX.  She will return for lab, follow-up, cycle 1 gemcitabine/Abraxane on 08/31/2019.  She will contact the office in the interim with any problems.  Patient seen with Dr. Benay Spice.  25 minutes were spent face-to-face at today's visit with the majority of that time involved in counseling/coordination of care.    [name] ANP/GNP-BC   08/22/2019  3:38 PM  This was a shared visit with [name].  Kaylee Mullins is symptomatic with pain at the left abdomen and flank.  The pain is likely related to recurrent tumor in the retroperitoneum.  We discussed treatment options again today.  She would like to proceed with a trial of systemic chemotherapy.  I recommend gemcitabine/Abraxane.  We reviewed toxicities associated with this regimen.  She agrees to proceed.  [name], MD

## 2019-08-23 DIAGNOSIS — Z7189 Other specified counseling: Secondary | ICD-10-CM | POA: Insufficient documentation

## 2019-08-23 NOTE — Progress Notes (Signed)
START OFF PATHWAY REGIMEN - Other   [id number]:Nab-Paclitaxel (Abraxane) 125 mg/m2 D1, 8, 15 + Gemcitabine 1,000 mg/m2 D1, 8, 15 q28 Days:   A cycle is every 28 days:     Nab-paclitaxel (protein bound)      Gemcitabine   **Always confirm dose/schedule in your pharmacy ordering system**  Patient Characteristics: Intent of Therapy: Non-Curative / Palliative Intent, Discussed with Patient 

## 2019-08-25 ENCOUNTER — Other Ambulatory Visit: Payer: Self-pay | Admitting: Radiology

## 2019-08-27 ENCOUNTER — Other Ambulatory Visit: Payer: Self-pay | Admitting: Oncology

## 2019-08-28 ENCOUNTER — Ambulatory Visit (HOSPITAL_COMMUNITY): Payer: Medicare Other

## 2019-08-28 ENCOUNTER — Inpatient Hospital Stay (HOSPITAL_COMMUNITY): Admission: RE | Admit: 2019-08-28 | Payer: Medicare Other | Source: Ambulatory Visit

## 2019-08-28 ENCOUNTER — Other Ambulatory Visit: Payer: Self-pay | Admitting: Radiology

## 2019-08-29 ENCOUNTER — Encounter (HOSPITAL_COMMUNITY): Payer: Self-pay

## 2019-08-29 ENCOUNTER — Ambulatory Visit (HOSPITAL_COMMUNITY)
Admission: RE | Admit: 2019-08-29 | Discharge: 2019-08-29 | Disposition: A | Discharge status: Home / Self Care | Payer: Medicare Other | Source: Ambulatory Visit | Attending: Nurse Practitioner | Admitting: Nurse Practitioner

## 2019-08-29 ENCOUNTER — Other Ambulatory Visit: Payer: Self-pay | Admitting: Nurse Practitioner

## 2019-08-29 ENCOUNTER — Other Ambulatory Visit: Payer: Self-pay

## 2019-08-29 ENCOUNTER — Inpatient Hospital Stay (HOSPITAL_COMMUNITY): Admission: RE | Admit: 2019-08-29 | Payer: Medicare Other | Source: Ambulatory Visit

## 2019-08-29 DIAGNOSIS — E785 Hyperlipidemia, unspecified: Secondary | ICD-10-CM | POA: Insufficient documentation

## 2019-08-29 DIAGNOSIS — Z8673 Personal history of transient ischemic attack (TIA), and cerebral infarction without residual deficits: Secondary | ICD-10-CM | POA: Insufficient documentation

## 2019-08-29 DIAGNOSIS — Z794 Long term (current) use of insulin: Secondary | ICD-10-CM | POA: Diagnosis not present

## 2019-08-29 DIAGNOSIS — E1122 Type 2 diabetes mellitus with diabetic chronic kidney disease: Secondary | ICD-10-CM | POA: Diagnosis not present

## 2019-08-29 DIAGNOSIS — Z7901 Long term (current) use of anticoagulants: Secondary | ICD-10-CM | POA: Insufficient documentation

## 2019-08-29 DIAGNOSIS — C241 Malignant neoplasm of ampulla of Vater: Secondary | ICD-10-CM

## 2019-08-29 DIAGNOSIS — I129 Hypertensive chronic kidney disease with stage 1 through stage 4 chronic kidney disease, or unspecified chronic kidney disease: Secondary | ICD-10-CM | POA: Insufficient documentation

## 2019-08-29 DIAGNOSIS — Z79890 Hormone replacement therapy (postmenopausal): Secondary | ICD-10-CM | POA: Insufficient documentation

## 2019-08-29 DIAGNOSIS — F329 Major depressive disorder, single episode, unspecified: Secondary | ICD-10-CM | POA: Insufficient documentation

## 2019-08-29 DIAGNOSIS — N189 Chronic kidney disease, unspecified: Secondary | ICD-10-CM | POA: Insufficient documentation

## 2019-08-29 DIAGNOSIS — E039 Hypothyroidism, unspecified: Secondary | ICD-10-CM | POA: Diagnosis not present

## 2019-08-29 DIAGNOSIS — Z79899 Other long term (current) drug therapy: Secondary | ICD-10-CM | POA: Diagnosis not present

## 2019-08-29 DIAGNOSIS — K219 Gastro-esophageal reflux disease without esophagitis: Secondary | ICD-10-CM | POA: Diagnosis not present

## 2019-08-29 DIAGNOSIS — F419 Anxiety disorder, unspecified: Secondary | ICD-10-CM | POA: Diagnosis not present

## 2019-08-29 HISTORY — PX: IR IMAGING GUIDED PORT INSERTION: IMG5740

## 2019-08-29 LAB — CBC WITH DIFFERENTIAL/PLATELET
Abs Immature Granulocytes: 0.03 10*3/uL (ref 0.00–0.07)
Basophils Absolute: 0 10*3/uL (ref 0.0–0.1)
Basophils Relative: 0 %
Eosinophils Absolute: 0 10*3/uL (ref 0.0–0.5)
Eosinophils Relative: 1 %
HCT: 39.3 % (ref 36.0–46.0)
Hemoglobin: 12.6 g/dL (ref 12.0–15.0)
Immature Granulocytes: 1 %
Lymphocytes Relative: 31 %
Lymphs Abs: 1.6 10*3/uL (ref 0.7–4.0)
MCH: 31.2 pg (ref 26.0–34.0)
MCHC: 32.1 g/dL (ref 30.0–36.0)
MCV: 97.3 fL (ref 80.0–100.0)
Monocytes Absolute: 0.3 10*3/uL (ref 0.1–1.0)
Monocytes Relative: 6 %
Neutro Abs: 3.2 10*3/uL (ref 1.7–7.7)
Neutrophils Relative %: 61 %
Platelets: 260 10*3/uL (ref 150–400)
RBC: 4.04 MIL/uL (ref 3.87–5.11)
RDW: 15.1 % (ref 11.5–15.5)
WBC: 5.1 10*3/uL (ref 4.0–10.5)
nRBC: 0 % (ref 0.0–0.2)

## 2019-08-29 LAB — BASIC METABOLIC PANEL
Anion gap: 9 (ref 5–15)
BUN: 23 mg/dL (ref 8–23)
CO2: 25 mmol/L (ref 22–32)
Calcium: 8.9 mg/dL (ref 8.9–10.3)
Chloride: 105 mmol/L (ref 98–111)
Creatinine, Ser: 0.9 mg/dL (ref 0.44–1.00)
GFR calc Af Amer: >60 mL/min (ref >60)
GFR calc non Af Amer: 60 mL/min — ABNORMAL LOW (ref >60)
Glucose, Bld: 88 mg/dL (ref 70–99)
Potassium: 3.7 mmol/L (ref 3.5–5.1)
Sodium: 139 mmol/L (ref 135–145)

## 2019-08-29 LAB — GLUCOSE, CAPILLARY: Glucose-Capillary: 86 mg/dL (ref 70–99)

## 2019-08-29 LAB — PROTIME-INR
INR: 1.2 (ref 0.8–1.2)
Prothrombin Time: 14.6 seconds (ref 11.4–15.2)

## 2019-08-29 MED ORDER — HEPARIN SOD (PORK) LOCK FLUSH 100 UNIT/ML IV SOLN
INTRAVENOUS | Status: AC | PRN
  Administered 2019-08-29: 500 [IU]

## 2019-08-29 MED ORDER — MIDAZOLAM HCL 2 MG/2ML IJ SOLN
INTRAMUSCULAR | Status: AC | PRN
  Administered 2019-08-29 (×2): 1 mg via INTRAVENOUS

## 2019-08-29 MED ORDER — NALOXONE HCL 0.4 MG/ML IJ SOLN
INTRAMUSCULAR | Status: AC
  Filled 2019-08-29: qty 1

## 2019-08-29 MED ORDER — FENTANYL CITRATE (PF) 100 MCG/2ML IJ SOLN
INTRAMUSCULAR | Status: AC | PRN
  Administered 2019-08-29 (×2): 50 ug via INTRAVENOUS

## 2019-08-29 MED ORDER — FENTANYL CITRATE (PF) 100 MCG/2ML IJ SOLN
INTRAMUSCULAR | Status: AC
  Filled 2019-08-29: qty 4

## 2019-08-29 MED ORDER — HEPARIN SOD (PORK) LOCK FLUSH 100 UNIT/ML IV SOLN
INTRAVENOUS | Status: AC
  Filled 2019-08-29: qty 5

## 2019-08-29 MED ORDER — LIDOCAINE-EPINEPHRINE (PF) 1 %-1:200000 IJ SOLN
INTRAMUSCULAR | Status: AC | PRN
  Administered 2019-08-29: 10 mL

## 2019-08-29 MED ORDER — CEFAZOLIN SODIUM-DEXTROSE 2-4 GM/100ML-% IV SOLN
2.0000 g | INTRAVENOUS | Status: AC
  Administered 2019-08-29: 2 g via INTRAVENOUS

## 2019-08-29 MED ORDER — LIDOCAINE-EPINEPHRINE 1 %-1:100000 IJ SOLN
INTRAMUSCULAR | Status: AC
  Filled 2019-08-29: qty 1

## 2019-08-29 MED ORDER — FLUMAZENIL 0.5 MG/5ML IV SOLN
INTRAVENOUS | Status: AC
  Filled 2019-08-29: qty 5

## 2019-08-29 MED ORDER — CEFAZOLIN SODIUM-DEXTROSE 2-4 GM/100ML-% IV SOLN
INTRAVENOUS | Status: AC
  Filled 2019-08-29: qty 100

## 2019-08-29 MED ORDER — MIDAZOLAM HCL 2 MG/2ML IJ SOLN
INTRAMUSCULAR | Status: AC
  Filled 2019-08-29: qty 4

## 2019-08-29 MED ORDER — SODIUM CHLORIDE 0.9 % IV SOLN: INTRAVENOUS | Status: DC

## 2019-08-29 NOTE — H&P (Signed)
Chief Complaint: Patient was seen in consultation today for port placement.  Referring Physician(s): Avbuere,Edwin  Supervising Physician: Jacqulynn Cadet  Patient Status: [facility] - Out-pt  History of Present Illness: Kaylee Mullins is a 84 y.o. 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 Kaylee Mullins is well known to IR service - she underwent right renal cryoablation with Dr. Kathlene Cote on 07/15/16, G tube placement on 02/16/17, G to Gj conversion on 02/23/17 and IVC filter placement 03/03/17 by Dr. Annamaria Boots. She has been followed regularly by oncology and underwent PET on 07/03/19 which noted hypermetabolic lymph nodes adjacent to the porta hepatis as well as new hypermetabolic lesions in the liver and retroperitoneum. She seen by Dr. Benay Spice on 08/03/19 and after thorough discussion decision was made to proceed with palliative chemotherapy for recurrent ampullary carcinoma - IR has been asked to place a port for venous access.  Kaylee Mullins states that she still has a lot of pain in several places, most notably today under her left breast on her rib cage, near her upper abdomen. She states this pain has been present for "awhile" and is unchanged, it is improved some with topical creams but seems to always be somewhat present. Certain positions also worsen the pain, such as sitting up in the hospital bed during my exam today. She reports vomiting bile at times as well as occasional non bloody diarrhea that does not seem to be related to her left upper abdominal pain. She also c/o a "fatty spot" under her right breast and is wondering if this will affect port placement. She states that she prayed everything will go well today and is ready to proceed as planned.   Past Medical History:    Diagnosis Date  . Acute CVA (cerebrovascular accident) ([place]) 09/16/2014  . Acute DVT of left tibial vein ([place]) 04/03/2017  . Adenocarcinoma ([place]) 01/12/2017  . 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 Anxiety   . Arthritis    knees  . Black tarry stools    05-14-16 negative for occult blood with ER visit- noted in [place].  . Cancer [facility])    thyroid cancer- surgery and radiation  . Cerebral infarction due to unspecified mechanism   . Chronic kidney disease    questionable mass on kidney. Being followed by Dr Diona Fanti  . Complication of anesthesia    heart rate was really low  . CVA (cerebral infarction) 09/11/2014  . Depression   . Diabetes mellitus    type 2  . Diabetes mellitus without complication ([place]) [id number]   Qualifier: Diagnosis of  By: Loanne Drilling MD, Jacelyn Pi   . Dyslipidemia 04/20/2007   Qualifier: Diagnosis of  By: Loanne Drilling MD, Jacelyn Pi   . Full dentures   . Gastroparesis 02/06/2017  . GERD (gastroesophageal reflux disease) 04/03/2017  . History of CVA (cerebral vascular accident) ([place]) 09/11/2014  . Hypertension   . Hypokalemia 08/25/2017  . Hypothyroidism   . Lumbar stenosis with neurogenic claudication 02/10/2016  . Osteoarthritis 09/11/2014  . Pneumonia   . Right renal mass 09/13/2014  . Spinal stenosis   . Stroke ([place]) 09/2014   left sided weakness  . SVT (supraventricular tachycardia) ([place]) 07/30/2017    Past Surgical History:  Procedure Laterality Date  . ABDOMINAL HYSTERECTOMY     partial  . cataracts  Removed  11/2015  bilateral  . COLONOSCOPY W/ POLYPECTOMY    . ERCP N/A 05/07/2016   Procedure: ENDOSCOPIC RETROGRADE CHOLANGIOPANCREATOGRAPHY (ERCP);  Surgeon: Clarene Essex, MD;  Location: [facility] ENDOSCOPY;  Service: Endoscopy;  Laterality: N/A;  . ERCP N/A 10/16/2016   Procedure: ENDOSCOPIC RETROGRADE CHOLANGIOPANCREATOGRAPHY (ERCP);  Surgeon: Clarene Essex, MD;  Location: [facility] ENDOSCOPY;  Service: Endoscopy;  Laterality: N/A;  . ESOPHAGOGASTRODUODENOSCOPY N/A 02/10/2017    Procedure: ESOPHAGOGASTRODUODENOSCOPY (EGD);  Surgeon: Teena Irani, MD;  Location: [facility] ENDOSCOPY;  Service: Endoscopy;  Laterality: N/A;  . EUS N/A 05/27/2016   Procedure: ESOPHAGEAL ENDOSCOPIC ULTRASOUND (EUS) RADIAL;  Surgeon: Arta Silence, MD;  Location: [facility] ENDOSCOPY;  Service: Endoscopy;  Laterality: N/A;  . FLEXIBLE SIGMOIDOSCOPY  03/29/2012   Procedure: FLEXIBLE SIGMOIDOSCOPY;  Surgeon: Jeryl Columbia, MD;  Location: [facility] ENDOSCOPY;  Service: Endoscopy;  Laterality: N/A;  fleet enema upon arrival  . HOT HEMOSTASIS  03/29/2012   Procedure: HOT HEMOSTASIS (ARGON PLASMA COAGULATION/BICAP);  Surgeon: Jeryl Columbia, MD;  Location: [facility] ENDOSCOPY;  Service: Endoscopy;  Laterality: N/A;  . IR GASTR TUBE CONVERT GASTR-JEJ PER W/FL MOD SED  02/23/2017  . IR GASTROSTOMY TUBE MOD SED  02/16/2017  . IR GENERIC HISTORICAL  06/30/2016   IR RADIOLOGIST EVAL & MGMT 06/30/2016 Aletta Edouard, MD [facility] INTERV RAD  . IR GENERIC HISTORICAL  09/09/2016   IR RADIOLOGIST EVAL & MGMT 09/09/2016 Aletta Edouard, MD [facility] INTERV RAD  . IR IVC FILTER PLMT / S&I /IMG GUID/MOD SED  03/03/2017  . IR PATIENT EVAL TECH 0-60 MINS  05/12/2017  . IR RADIOLOGIST EVAL & MGMT  11/24/2017  . IR RADIOLOGIST EVAL & MGMT  04/07/2018  . IR RADIOLOGIST EVAL & MGMT  05/18/2019  . LAPAROSCOPY N/A 01/12/2017   Procedure: LAPAROSCOPY DIAGNOSTIC;  Surgeon: Stark Klein, MD;  Location: [facility];  Service: General;  Laterality: N/A;  . LAPAROTOMY N/A 03/10/2017   Procedure: EXPLORATORY LAPAROTOMY Open jejunostomy tube;  Surgeon: Stark Klein, MD;  Location: [facility];  Service: General;  Laterality: N/A;  . LUMBAR LAMINECTOMY/DECOMPRESSION MICRODISCECTOMY N/A 02/10/2016   Procedure: Lumbar three- four Laminectomy;  Surgeon: Kristeen Miss, MD;  Location: [facility] NEURO ORS;  Service: Neurosurgery;  Laterality: N/A;  L3-4 Laminectomy  . LUMBAR SPINE SURGERY     1st surgery "ray cage placed"  . THYROIDECTOMY  2005  . TONSILLECTOMY    . WHIPPLE PROCEDURE N/A 01/12/2017    Procedure: WHIPPLE PROCEDURE;  Surgeon: Stark Klein, MD;  Location: [facility];  Service: General;  Laterality: N/A;    Allergies: Hydralazine hcl, Amlodipine besylate, Darvon [propoxyphene hcl], Nyquil multi-symptom [pseudoeph-doxylamine-dm-apap], and Metformin and related  Medications: Prior to Admission medications   Medication Sig Start Date End Date Taking? Authorizing Provider  diclofenac sodium (VOLTAREN) 1 % GEL Apply topically as needed.   Yes [provider]  HYDROcodone-acetaminophen (NORCO) 5-325 MG tablet Take 1-2 tablets by mouth every 12 (twelve) hours as needed for moderate pain. 07/07/19  Yes Owens Shark, NP  insulin detemir (LEVEMIR) 100 UNIT/ML injection Inject 0.07 mLs (7 Units total) into the skin 2 (two) times daily. Patient taking differently: Inject 40 Units into the skin daily.  08/06/17  Yes Patrecia Pour, Christean Grief, MD  levothyroxine (SYNTHROID, LEVOTHROID) 200 MCG tablet Take 200 mcg by mouth daily before breakfast. For hypothyroidism 11/11/15  Yes [provider]  losartan (COZAAR) 100 MG tablet Take 100 mg by mouth daily. 08/02/18  Yes [provider]  NIFEdipine (PROCARDIA-XL/ADALAT-CC/NIFEDICAL-XL) 30 MG 24 hr tablet Take  30 mg by mouth daily. 07/04/17  Yes [provider]  pantoprazole (PROTONIX) 40 MG tablet Take 1 tablet (40 mg total) by mouth at bedtime. 02/04/17  Yes Stark Klein, MD  potassium chloride SA (KLOR-CON M20) 20 MEQ tablet Take 3 tablets daily alternating with 1 tablet daily.  Take with furosemide. Patient taking differently: Take 40 mEq by mouth daily.  11/22/17  Yes Fay Records, MD  acetaminophen (TYLENOL) 500 MG tablet Take 500 mg by mouth every 6 (six) hours as needed for mild pain.    [provider]  carvedilol (COREG) 25 MG tablet Take 1 tablet (25 mg total) by mouth 2 (two) times daily. 07/30/17 06/21/19  Sueanne Margarita, MD  furosemide (LASIX) 80 MG tablet Take as directed.  Take 80 mg daily  alternating with 40 mg daily Patient taking differently: Take 40-80 mg by mouth See admin instructions. Take 80 mg daily alternating with 40 mg daily. 11/22/17   Fay Records, MD  HUMALOG KWIKPEN 100 UNIT/ML KwikPen Inject 7-10 Units into the skin See admin instructions. Three times daily per sliding scale 06/30/18   [provider]  lidocaine-prilocaine (EMLA) cream Apply to port site 1-2 hours prior to use 08/22/19   Owens Shark, NP  ondansetron (ZOFRAN) 8 MG tablet Take 1 tablet (8 mg total) by mouth every 8 (eight) hours as needed for nausea or vomiting. 08/03/19   Owens Shark, NP  oxyCODONE-acetaminophen (PERCOCET/ROXICET) 5-325 MG tablet Take 1-2 tablets by mouth every 8 (eight) hours as needed for severe pain. 08/03/19   Owens Shark, NP  prochlorperazine (COMPAZINE) 5 MG tablet Take 1 tablet (5 mg total) by mouth every 6 (six) hours as needed for nausea or vomiting. 08/22/19   Owens Shark, NP  Vitamin D, Ergocalciferol, (DRISDOL) 50000 units CAPS capsule Take 50,000 Units by mouth once a week. 12/14/17   [provider]  warfarin (COUMADIN) 7.5 MG tablet Take 1 tablet (7.5 mg total) by mouth one time only at 6 PM. 08/06/17   Patrecia Pour, Christean Grief, MD     Family History  Problem Relation Age of Onset  . Heart failure Mother   . Heart attack Father     Social History   Socioeconomic History  . Marital status: Widowed    Spouse name: Not on file  . Number of children: Not on file  . Years of education: Not on file  . Highest education level: Not on file  Occupational History  . Occupation: housewife    Comment: college housekeeping  Tobacco Use  . Smoking status: Never Smoker  . Smokeless tobacco: Never Used  Substance and Sexual Activity  . Alcohol use: No  . Drug use: Yes    Types: Methamphetamines  . Sexual activity: Never  Other Topics Concern  . Not on file  Social History Narrative   Widowed,  retired, 3 children living, 2 children deceased    Caffeine use - soda every few days   Right handed   Pt lives alone.  Using cane when out and about.    Admitted to [facility] 03/29/17   Never smoked   Alcohol none    Full Code   Social Determinants of Health   Financial Resource Strain:   . Difficulty of Paying Living Expenses: Not on file  Food Insecurity:   . Worried About Charity fundraiser in the Last Year: Not on file  . Ran Out of Food  in the Last Year: Not on file  Transportation Needs:   . Lack of Transportation (Medical): Not on file  . Lack of Transportation (Non-Medical): Not on file  Physical Activity:   . Days of Exercise per Week: Not on file  . Minutes of Exercise per Session: Not on file  Stress:   . Feeling of Stress : Not on file  Social Connections:   . Frequency of Communication with Friends and Family: Not on file  . Frequency of Social Gatherings with Friends and Family: Not on file  . Attends Religious Services: Not on file  . Active Member of Clubs or Organizations: Not on file  . Attends Archivist Meetings: Not on file  . Marital Status: Not on file     Review of Systems: A 12 point ROS discussed and pertinent positives are indicated in the HPI above.  All other systems are negative.  Review of Systems  Constitutional: Negative for chills and fever.  Respiratory: Negative for cough and shortness of breath.   Cardiovascular: Positive for chest pain (left rib cage - chronic).  Gastrointestinal: Positive for abdominal pain (LUQ - chronic), diarrhea (occasionally - non currently.) and vomiting ("sometimes I vomit bile" -- none recently). Negative for blood in stool and nausea.  Musculoskeletal: Positive for back pain (chronic).  Skin: Negative for rash and wound.       (+) "fatty lump under my right breast"  Neurological: Positive for weakness (left side after stroke). Negative for dizziness, numbness and headaches.    Vital Signs: BP 140/70   Pulse (!) 58   Temp 98.2  F (36.8 C) (Oral)   Resp 18   SpO2 99%   Physical Exam Vitals reviewed.  Constitutional:      General: She is not in acute distress. HENT:     Head: Normocephalic.     Mouth/Throat:     Mouth: Mucous membranes are moist.     Pharynx: Oropharynx is clear. No oropharyngeal exudate or posterior oropharyngeal erythema.     Comments: (+) upper and lower full dentures Cardiovascular:     Rate and Rhythm: Regular rhythm. Bradycardia present.  Pulmonary:     Effort: Pulmonary effort is normal.     Breath sounds: Normal breath sounds.  Abdominal:     General: There is no distension.     Palpations: Abdomen is soft.     Tenderness: There is no abdominal tenderness.  Skin:    General: Skin is warm and dry.  Neurological:     Mental Status: She is alert and oriented to person, place, and time.  Psychiatric:        Mood and Affect: Mood normal.        Behavior: Behavior normal.        Thought Content: Thought content normal.        Judgment: Judgment normal.      MD Evaluation Airway: WNL Heart: WNL Abdomen: WNL Chest/ Lungs: WNL ASA  Classification: 2 Mallampati/Airway Score: One(Upper and lower full dentures)   Imaging: No results found.  Labs:  CBC: Recent Labs    04/12/19 1325 04/13/19 0445 08/29/19 1235  WBC 6.0 6.4 5.1  HGB 12.2 10.4* 12.6  HCT 38.9 32.3* 39.3  PLT 294 298 260    COAGS: Recent Labs    04/12/19 1325 04/13/19 0445 04/14/19 0452 08/29/19 1235  INR 2.4* 2.7* 2.4* 1.2  APTT  --  51*  --   --     BMP:  Recent Labs    04/12/19 1325 04/13/19 0445 04/14/19 0452 08/29/19 1235  NA 138 140 139 139  K 3.9 3.5 3.8 3.7  CL 103 107 105 105  CO2 23 24 25 25   GLUCOSE 356* 291* 162* 88  BUN 24* 24* 18 23  CALCIUM 8.6* 8.1* 8.2* 8.9  CREATININE 1.11* 1.26* 1.02* 0.90  GFRNONAA 47* 40* 52* 60*  GFRAA 54* 47* >60 >60    LIVER FUNCTION TESTS: Recent Labs    09/09/18 1014 04/12/19 1325  BILITOT 0.7 1.2  AST 70* 33  ALT 64* 23    ALKPHOS 138* 89  PROT 7.3 7.2  ALBUMIN 3.5 3.3*    TUMOR MARKERS: No results for input(s): AFPTM, CEA, CA199, CHROMGRNA in the last 8760 hours.  Assessment and Plan:  84 y/o F with history of right papillary renal carcinoma s/p cryoablation 07/15/16 in IR as well as ampullary adenocarcinoma s/p Whipple procedure 01/12/17 who is followed by Dr. Benay Spice. She unfortunately has evidence of recurrent ampullary carcinoma on most recent imaging and decision has been made to proceed with palliative chemotherapy, IR has been asked to place a port today to facilitate this.  Patient has been NPO since 10 am, she takes coumadin with last dose 08/23/19. Afebrile, WBC 5.1, hgb 10.4, plt 260, creatinine 0.90, INR 1.2.  Risks and benefits of image-guided Port-a-catheter placement were discussed with the patient including, but not limited to bleeding, infection, pneumothorax, or fibrin sheath development and need for additional procedures.  All of the patient's questions were answered, patient is agreeable to proceed.  Consent signed and in chart.  Thank you for this interesting consult.  I greatly enjoyed meeting Kaylee Mullins and look forward to participating in their care.  A copy of this report was sent to the requesting provider on this date.  Electronically Signed: Joaquim Nam, PA-C 08/29/2019, 2:08 PM   I spent a total of  25 Minutes in face to face in clinical consultation, greater than 50% of which was counseling/coordinating care for port placement.

## 2019-08-29 NOTE — Discharge Instructions (Signed)
There are no changes to your home medications. Start your coumadin tomorrow 08/30/2019    DO NOT USE EMLA CREAM FOR TWO WEEKS. For your appt on 08/31/19, use ice in a zip lock bag for 2-3 minutes before the nurses access your new port. The EMLA cream will dissolve the skin glue holding the skin together. The skin will come apart over your new port and will result in a skin infection.     Implanted Port Insertion, Care After This sheet gives you information about how to care for yourself after your procedure. Your health care provider may also give you more specific instructions. If you have problems or questions, contact your health care provider. What can I expect after the procedure? After the procedure, it is common to have:  Discomfort at the port insertion site.  Bruising on the skin over the port. This should improve over 3-4 days. Follow these instructions at home: [facility] care  After your port is placed, you will get a manufacturer's information card. The card has information about your port. Keep this card with you at all times.  Take care of the port as told by your health care provider. Ask your health care provider if you or a family member can get training for taking care of the port at home. A home health care nurse may also take care of the port.  Make sure to remember what type of port you have. Incision care Follow instructions from your health care provider about how to take care of your port insertion site. Make sure you: ? Wash your hands with soap and water before and after you change your bandage (dressing). If soap and water are not available, use hand sanitizer. ? Change your dressing as told by your health care provider.  Leave  skin glue in place. These skin closures may need to stay in place for 2 weeks or longer.  Check your port insertion site every day for signs of infection. Check for: ? Redness, swelling, or pain. ? Fluid or blood. ? Warmth. ? Pus or a  bad smell. Activity  Return to your normal activities as told by your health care provider. Ask your health care provider what activities are safe for you.  Do not lift anything that is heavier than 10 lb (4.5 kg), or the limit that you are told, until your health care provider says that it is safe. General instructions  Take over-the-counter and prescription medicines only as told by your health care provider.  Do not take baths, swim, or use a hot tub until your health care provider approves. You may shower tomorrow afternoon around 4 PM. Remove gauze and clear dressing over gauze prior to taking shower.  Do not drive for 24 hours if you were given a sedative during your procedure.  Wear a medical alert bracelet in case of an emergency. This will tell any health care providers that you have a port.  Keep all follow-up visits as told by your health care provider. This is important. Contact a health care provider if:  You cannot flush your port with saline as directed, or you cannot draw blood from the port.  You have a fever or chills.  You have redness, swelling, or pain around your port insertion site.  You have fluid or blood coming from your port insertion site.  Your port insertion site feels warm to the touch.  You have pus or a bad smell coming from the port insertion site.  Get help right away if:  You have chest pain or shortness of breath.  You have bleeding from your port that you cannot control. Summary  Take care of the port as told by your health care provider. Keep the manufacturer's information card with you at all times.  Change your dressing as told by your health care provider.  Contact a health care provider if you have a fever or chills or if you have redness, swelling, or pain around your port insertion site.  Keep all follow-up visits as told by your health care provider. This information is not intended to replace advice given to you by your health  care provider. Make sure you discuss any questions you have with your health care provider. Document Revised: 03/01/2018 Document Reviewed: 03/01/2018 Elsevier Patient Education  [place].      Moderate Conscious Sedation, Adult, Care After These instructions provide you with information about caring for yourself after your procedure. Your health care provider may also give you more specific instructions. Your treatment has been planned according to current medical practices, but problems sometimes occur. Call your health care provider if you have any problems or questions after your procedure. What can I expect after the procedure? After your procedure, it is common:  To feel sleepy for several hours.  To feel clumsy and have poor balance for several hours.  To have poor judgment for several hours.  To vomit if you eat too soon. Follow these instructions at home: For at least 24 hours after the procedure:   Do not: ? Participate in activities where you could fall or become injured. ? Drive. ? Use heavy machinery. ? Drink alcohol. ? Take sleeping pills or medicines that cause drowsiness. ? Make important decisions or sign legal documents. ? Take care of children on your own.  Rest. Eating and drinking  Follow the diet recommended by your health care provider.  If you vomit: ? Drink water, juice, or soup when you can drink without vomiting. ? Make sure you have little or no nausea before eating solid foods. General instructions  Have a responsible adult stay with you until you are awake and alert.  Take over-the-counter and prescription medicines only as told by your health care provider.  If you smoke, do not smoke without supervision.  Keep all follow-up visits as told by your health care provider. This is important. Contact a health care provider if:  You keep feeling nauseous or you keep vomiting.  You feel light-headed.  You develop a rash.  You  have a fever. Get help right away if:  You have trouble breathing. This information is not intended to replace advice given to you by your health care provider. Make sure you discuss any questions you have with your health care provider. Document Revised: 07/16/2017 Document Reviewed: 11/23/2015 Elsevier Patient Education  2020 [employer].

## 2019-08-29 NOTE — Procedures (Signed)
Interventional Radiology Procedure Note  Procedure: Placement of a right IJ approach single lumen PowerPort.  Tip is positioned at the superior cavoatrial junction and catheter is ready for immediate use.  Complications: No immediate Recommendations:  - Ok to shower tomorrow - Do not submerge for 7 days - Routine line care   Signed,  Sharhonda Atwood K. Clarnce Homan, MD   

## 2019-08-31 ENCOUNTER — Inpatient Hospital Stay: Payer: Medicare Other

## 2019-08-31 ENCOUNTER — Other Ambulatory Visit: Payer: Self-pay

## 2019-08-31 ENCOUNTER — Inpatient Hospital Stay: Payer: Medicare Other | Admitting: Nutrition

## 2019-08-31 ENCOUNTER — Inpatient Hospital Stay (HOSPITAL_BASED_OUTPATIENT_CLINIC_OR_DEPARTMENT_OTHER): Payer: Medicare Other | Admitting: Nurse Practitioner

## 2019-08-31 ENCOUNTER — Encounter: Payer: Self-pay | Admitting: Nurse Practitioner

## 2019-08-31 VITALS — BP 137/91 | HR 69 | Temp 98.2°F | Resp 16 | Ht 64.0 in | Wt 183.6 lb

## 2019-08-31 DIAGNOSIS — C241 Malignant neoplasm of ampulla of Vater: Secondary | ICD-10-CM

## 2019-08-31 DIAGNOSIS — Z95828 Presence of other vascular implants and grafts: Secondary | ICD-10-CM

## 2019-08-31 DIAGNOSIS — Z5111 Encounter for antineoplastic chemotherapy: Secondary | ICD-10-CM | POA: Diagnosis not present

## 2019-08-31 LAB — CMP (CANCER CENTER ONLY)
ALT: 20 U/L (ref 0–44)
AST: 32 U/L (ref 15–41)
Albumin: 3.5 g/dL (ref 3.5–5.0)
Alkaline Phosphatase: 99 U/L (ref 38–126)
Anion gap: 10 (ref 5–15)
BUN: 19 mg/dL (ref 8–23)
CO2: 25 mmol/L (ref 22–32)
Calcium: 8.1 mg/dL — ABNORMAL LOW (ref 8.9–10.3)
Chloride: 106 mmol/L (ref 98–111)
Creatinine: 0.99 mg/dL (ref 0.44–1.00)
GFR, Est AFR Am: >60 mL/min (ref >60)
GFR, Estimated: 53 mL/min — ABNORMAL LOW (ref >60)
Glucose, Bld: 148 mg/dL — ABNORMAL HIGH (ref 70–99)
Potassium: 3.5 mmol/L (ref 3.5–5.1)
Sodium: 141 mmol/L (ref 135–145)
Total Bilirubin: 1.1 mg/dL (ref 0.3–1.2)
Total Protein: 7.1 g/dL (ref 6.5–8.1)

## 2019-08-31 LAB — CBC WITH DIFFERENTIAL (CANCER CENTER ONLY)
Abs Immature Granulocytes: 0.01 10*3/uL (ref 0.00–0.07)
Basophils Absolute: 0 10*3/uL (ref 0.0–0.1)
Basophils Relative: 1 %
Eosinophils Absolute: 0 10*3/uL (ref 0.0–0.5)
Eosinophils Relative: 0 %
HCT: 38.9 % (ref 36.0–46.0)
Hemoglobin: 12.7 g/dL (ref 12.0–15.0)
Immature Granulocytes: 0 %
Lymphocytes Relative: 32 %
Lymphs Abs: 1.5 10*3/uL (ref 0.7–4.0)
MCH: 31.6 pg (ref 26.0–34.0)
MCHC: 32.6 g/dL (ref 30.0–36.0)
MCV: 96.8 fL (ref 80.0–100.0)
Monocytes Absolute: 0.4 10*3/uL (ref 0.1–1.0)
Monocytes Relative: 7 %
Neutro Abs: 2.9 10*3/uL (ref 1.7–7.7)
Neutrophils Relative %: 60 %
Platelet Count: 244 10*3/uL (ref 150–400)
RBC: 4.02 MIL/uL (ref 3.87–5.11)
RDW: 15.2 % (ref 11.5–15.5)
WBC Count: 4.8 10*3/uL (ref 4.0–10.5)
nRBC: 0 % (ref 0.0–0.2)

## 2019-08-31 MED ORDER — HEPARIN SOD (PORK) LOCK FLUSH 100 UNIT/ML IV SOLN
500.0000 [IU] | Freq: Once | INTRAVENOUS | Status: AC | PRN
  Administered 2019-08-31: 500 [IU]
  Filled 2019-08-31: qty 5

## 2019-08-31 MED ORDER — PROCHLORPERAZINE MALEATE 10 MG PO TABS
10.0000 mg | ORAL_TABLET | Freq: Once | ORAL | Status: AC
  Administered 2019-08-31: 10 mg via ORAL

## 2019-08-31 MED ORDER — SODIUM CHLORIDE 0.9 % IV SOLN
800.0000 mg/m2 | Freq: Once | INTRAVENOUS | Status: AC
  Administered 2019-08-31: 1558 mg via INTRAVENOUS
  Filled 2019-08-31: qty 40.98

## 2019-08-31 MED ORDER — SODIUM CHLORIDE 0.9% FLUSH
10.0000 mL | INTRAVENOUS | Status: DC | PRN
  Administered 2019-08-31: 10 mL
  Filled 2019-08-31: qty 10

## 2019-08-31 MED ORDER — SODIUM CHLORIDE 0.9% FLUSH
10.0000 mL | INTRAVENOUS | Status: DC | PRN
  Administered 2019-08-31: 10 mL via INTRAVENOUS
  Filled 2019-08-31: qty 10

## 2019-08-31 MED ORDER — PACLITAXEL PROTEIN-BOUND CHEMO INJECTION 100 MG
100.0000 mg/m2 | Freq: Once | INTRAVENOUS | Status: AC
  Administered 2019-08-31: 200 mg via INTRAVENOUS
  Filled 2019-08-31: qty 40

## 2019-08-31 MED ORDER — PROCHLORPERAZINE MALEATE 10 MG PO TABS
ORAL_TABLET | ORAL | Status: AC
  Filled 2019-08-31: qty 1

## 2019-08-31 MED ORDER — SODIUM CHLORIDE 0.9 % IV SOLN
Freq: Once | INTRAVENOUS | Status: AC
  Filled 2019-08-31: qty 250

## 2019-08-31 NOTE — Progress Notes (Signed)
Patient is an 84 year old female with recurrent ampullary cancer.  She is status post Whipple procedure Jan 12, 2017. Weight was documented as 183.6 pounds today increased from 182.9 pounds January 5. Patient denies nutrition impact symptoms.  She reports she had a bowel movement and is no longer constipated. Patient is requesting Ensure samples.  No nutrition diagnosis at this time. Provided 1 complementary case of Ensure Enlive.  Patient also has contact information for any future needs.  **Disclaimer: This note was dictated with voice recognition software. Similar sounding words can inadvertently be transcribed and this note may contain transcription errors which may not have been corrected upon publication of note.**

## 2019-08-31 NOTE — Progress Notes (Signed)
[place] OFFICE PROGRESS NOTE   Diagnosis: Ampullary carcinoma  INTERVAL HISTORY:   Kaylee Mullins returns as scheduled.  Pain is unchanged.  She takes 1-1/2-2 Norco tablets a day.  She began MiraLAX for constipation.  She had a large bowel movement today.  No nausea or vomiting.  Objective:  Vital signs in last 24 hours:  Blood pressure (!) 137/91, pulse 69, temperature 98.2 F (36.8 C), temperature source Temporal, resp. rate 16, height 5\' 4"  (1.626 m), weight 183 lb 9.6 oz (83.3 kg), SpO2 100 %.    GI: Abdomen soft and nontender.  No hepatomegaly. Vascular: Trace edema at the lower legs bilaterally. Neuro: Alert and oriented. Skin: Chronic stasis change lower legs. Port-A-Cath without erythema.   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 She presents for port catheter placement for chemotherapy. EXAM: IMPLANTED PORT A CATH PLACEMENT WITH ULTRASOUND AND FLUOROSCOPIC GUIDANCE MEDICATIONS: 2 g Ancef; The antibiotic was administered within an appropriate time interval prior to skin puncture. ANESTHESIA/SEDATION: Versed 2 mg IV; Fentanyl 100 mcg IV; Moderate Sedation Time:  19 minutes The patient was continuously monitored during the procedure by the interventional radiology nurse under my direct supervision. FLUOROSCOPY TIME:  0 minutes, 18 seconds (3 mGy) COMPLICATIONS: None immediate. PROCEDURE: The right neck and chest was prepped with chlorhexidine, and draped in the usual sterile fashion using maximum barrier technique (cap and mask, sterile gown, sterile gloves, large sterile sheet, hand hygiene and cutaneous antiseptic). Local anesthesia was attained by infiltration with 1%  lidocaine with epinephrine. Ultrasound demonstrated patency of the right internal jugular vein, and this was documented with an image. Under real-time ultrasound guidance, this vein was accessed with a 21 gauge micropuncture needle and image documentation was performed. A small dermatotomy was made at the access site with an 11 scalpel. A 0.018" wire was advanced into the SVC and the access needle exchanged for a 44F micropuncture vascular sheath. The 0.018" wire was then removed and a 0.035" wire advanced into the IVC. An appropriate location for the subcutaneous reservoir was selected below the clavicle and an incision was made through the skin and underlying soft tissues. The subcutaneous tissues were then dissected using a combination of blunt and sharp surgical technique and a pocket was formed. A single lumen power injectable portacatheter was then tunneled through the subcutaneous tissues from the pocket to the dermatotomy and the port reservoir placed within the subcutaneous pocket. The venous access site was then serially dilated and a peel away vascular sheath placed over the wire. The wire was removed and the port catheter advanced into position under fluoroscopic guidance. The catheter tip is positioned in the superior cavoatrial junction. This was documented with a spot image. The portacatheter was then tested and found to flush and aspirate well. The port was flushed with saline followed by 100 units/mL heparinized saline. The pocket was then closed in two layers using first subdermal inverted interrupted absorbable sutures followed by a running subcuticular suture. The epidermis was then sealed with Dermabond. The dermatotomy at the venous access site was also closed with Dermabond. IMPRESSION: Successful placement of a right IJ approach Power Port with ultrasound and fluoroscopic guidance. The catheter  is ready for use. Electronically Signed   By: Jacqulynn Cadet M.D.   On: 08/29/2019 15:50     Medications: I have reviewed the patient's current medications.  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 CT abdomen/pelvis 10/23/2016-ampullary mass, single mildly enlarged porta hepatis lymph node, no evidence of distant metastatic disease ? Status post pancreaticoduodenectomy 01/12/2017; pT3pN2 ? CT abdomen/pelvis 03/30/2018-ablation defect within the upper pole of the right kidney. No evidence of recurrent renal mass. New mild left periaortic retroperitoneal lymphadenopathy. ? CT abdomen/pelvis 06/30/2018-6 mm right middle lobe nodule-slowly growing over multiple CTs, stable ablation defect in the right kidney, 1.9 cm left periaortic node increased from 1.2 cm, stable 1.5 cm porta hepatis node ? CT abdomen/pelvis 09/17/2018-surgical changes related to Whipple procedure. Stable upper abdominal/retroperitoneal lymphadenopathy. 5 mm right middle lobe nodule, stable from most recent CT but mildly progressive from priors. Postprocedural changes related to renal ablation in the medial right upper kidney. ? CTs 03/24/2019-a few scattered pulmonary nodules appears stable; liver has a slightly shrunken appearance and nodular appearance compatible with mild cirrhosis. No suspicious cystic or solid hepatic lesions. Upper abdominal and retroperitoneal lymphadenopathy appears similar compared to the prior study. No other new lymphadenopathy noted elsewhere in the abdomen or pelvis. ? PET scan 07/03/2019-hypermetabolic lymph node adjacent porta hepatis. Newfocal hypermetabolic lesion in the liver. Hypermetabolic activity in the left suprarenal location favored metastatic adenopathy in the retroperitoneum. Diffuse hypermetabolic activity within the stomach favor gastritis. ? Cycle 1 gemcitabine/Abraxane 08/31/2019 2. Biliary obstruction secondary to #1, status post placement of a metal, bile duct  stent on 10/23/2016  3. 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 Renal cell carcinoma-status post ablation of a right renal mass 07/15/2016, biopsy confirmed papillary renal cell carcinoma, Fuhrman grade 3  5. CVA in 2016  6. History of thyroid cancer-status post thyroidectomy and radioactive iodine in 2005  7. Diabetes  8.Hypertension  9.Left lower extremity deep vein thrombosis June 2019, IVC filter placed July 2018 after she was diagnosed with a rectus hematoma while on Lovenox  10. Hospitalized 04/12/2019 through 04/14/2019 with traumatic hematoma right lower leg, followed at the wound clinic  11.   Port-A-Cath placement interventional radiology 08/29/2019  Disposition: Kaylee Mullins appears unchanged.  She is scheduled to begin treatment with gemcitabine/Abraxane today.  We again reviewed potential toxicities.  She has attended the chemotherapy education class.  She agrees to proceed with treatment today as planned.  We reviewed the CBC from today.  Counts are adequate to proceed with treatment.  She will return for lab, follow-up, cycle 2 gemcitabine/Abraxane in 2 weeks.  She will contact the office in the interim with any problems.    [name] ANP/GNP-BC   08/31/2019  2:21 PM

## 2019-08-31 NOTE — Patient Instructions (Signed)
[facility] Discharge Instructions for Patients Receiving Chemotherapy  Today you received the following chemotherapy agents: Abraxane, Gemzar  To help prevent nausea and vomiting after your treatment, we encourage you to take your nausea medication as directed.   If you develop nausea and vomiting that is not controlled by your nausea medication, call the clinic.   BELOW ARE SYMPTOMS THAT SHOULD BE REPORTED IMMEDIATELY:  *FEVER GREATER THAN 100.5 F  *CHILLS WITH OR WITHOUT FEVER  NAUSEA AND VOMITING THAT IS NOT CONTROLLED WITH YOUR NAUSEA MEDICATION  *UNUSUAL SHORTNESS OF BREATH  *UNUSUAL BRUISING OR BLEEDING  TENDERNESS IN MOUTH AND THROAT WITH OR WITHOUT PRESENCE OF ULCERS  *URINARY PROBLEMS  *BOWEL PROBLEMS  UNUSUAL RASH Items with * indicate a potential emergency and should be followed up as soon as possible.  Feel free to call the clinic should you have any questions or concerns. The clinic phone number is [phone].  Please show the CHEMO ALERT CARD at check-in to the Emergency Department and triage nurse.  Nanoparticle Albumin-Bound Paclitaxel injection What is this medicine? NANOPARTICLE ALBUMIN-BOUND PACLITAXEL (Na no PAHR ti kuhl al BYOO muhn-bound PAK li TAX el) is a chemotherapy drug. It targets fast dividing cells, like cancer cells, and causes these cells to die. This medicine is used to treat advanced breast cancer, lung cancer, and pancreatic cancer. This medicine may be used for other purposes; ask your health care provider or pharmacist if you have questions. COMMON BRAND NAME(S): Abraxane What should I tell my health care provider before I take this medicine? They need to know if you have any of these conditions:  kidney disease  liver disease  low blood counts, like low white cell, platelet, or red cell counts  lung or breathing disease, like asthma  tingling of the fingers or toes, or other nerve disorder  an unusual or  allergic reaction to paclitaxel, albumin, other chemotherapy, other medicines, foods, dyes, or preservatives  pregnant or trying to get pregnant  breast-feeding How should I use this medicine? This drug is given as an infusion into a vein. It is administered in a hospital or clinic by a specially trained health care professional. Talk to your pediatrician regarding the use of this medicine in children. Special care may be needed. Overdosage: If you think you have taken too much of this medicine contact a poison control center or emergency room at once. NOTE: This medicine is only for you. Do not share this medicine with others. What if I miss a dose? It is important not to miss your dose. Call your doctor or health care professional if you are unable to keep an appointment. What may interact with this medicine? This medicine may interact with the following medications:  antiviral medicines for hepatitis, HIV or AIDS  certain antibiotics like erythromycin and clarithromycin  certain medicines for fungal infections like ketoconazole and itraconazole  certain medicines for seizures like carbamazepine, phenobarbital, phenytoin  gemfibrozil  nefazodone  rifampin  St. John's wort This list may not describe all possible interactions. Give your health care provider a list of all the medicines, herbs, non-prescription drugs, or dietary supplements you use. Also tell them if you smoke, drink alcohol, or use illegal drugs. Some items may interact with your medicine. What should I watch for while using this medicine? Your condition will be monitored carefully while you are receiving this medicine. You will need important blood work done while you are taking this medicine. This medicine can cause serious   allergic reactions. If you experience allergic reactions like skin rash, itching or hives, swelling of the face, lips, or tongue, tell your doctor or health care professional right away. In some  cases, you may be given additional medicines to help with side effects. Follow all directions for their use. This drug may make you feel generally unwell. This is not uncommon, as chemotherapy can affect healthy cells as well as cancer cells. Report any side effects. Continue your course of treatment even though you feel ill unless your doctor tells you to stop. Call your doctor or health care professional for advice if you get a fever, chills or sore throat, or other symptoms of a cold or flu. Do not treat yourself. This drug decreases your body's ability to fight infections. Try to avoid being around people who are sick. This medicine may increase your risk to bruise or bleed. Call your doctor or health care professional if you notice any unusual bleeding. Be careful brushing and flossing your teeth or using a toothpick because you may get an infection or bleed more easily. If you have any dental work done, tell your dentist you are receiving this medicine. Avoid taking products that contain aspirin, acetaminophen, ibuprofen, naproxen, or ketoprofen unless instructed by your doctor. These medicines may hide a fever. Do not become pregnant while taking this medicine or for 6 months after stopping it. Women should inform their doctor if they wish to become pregnant or think they might be pregnant. Men should not father a child while taking this medicine or for 3 months after stopping it. There is a potential for serious side effects to an unborn child. Talk to your health care professional or pharmacist for more information. Do not breast-feed an infant while taking this medicine or for 2 weeks after stopping it. This medicine may interfere with the ability to get pregnant or to father a child. You should talk to your doctor or health care professional if you are concerned about your fertility. What side effects may I notice from receiving this medicine? Side effects that you should report to your doctor  or health care professional as soon as possible:  allergic reactions like skin rash, itching or hives, swelling of the face, lips, or tongue  breathing problems  changes in vision  fast, irregular heartbeat  low blood pressure  mouth sores  pain, tingling, numbness in the hands or feet  signs of decreased platelets or bleeding - bruising, pinpoint red spots on the skin, black, tarry stools, blood in the urine  signs of decreased red blood cells - unusually weak or tired, feeling faint or lightheaded, falls  signs of infection - fever or chills, cough, sore throat, pain or difficulty passing urine  signs and symptoms of liver injury like dark yellow or brown urine; general ill feeling or flu-like symptoms; light-colored stools; loss of appetite; nausea; right upper belly pain; unusually weak or tired; yellowing of the eyes or skin  swelling of the ankles, feet, hands  unusually slow heartbeat Side effects that usually do not require medical attention (report to your doctor or health care professional if they continue or are bothersome):  diarrhea  hair loss  loss of appetite  nausea, vomiting  tiredness This list may not describe all possible side effects. Call your doctor for medical advice about side effects. You may report side effects to FDA at 1-800-FDA-1088. Where should I keep my medicine? This drug is given in a hospital or clinic and will  not be stored at home. NOTE: This sheet is a summary. It may not cover all possible information. If you have questions about this medicine, talk to your doctor, pharmacist, or health care provider.  2020 Elsevier/Gold Standard (2017-04-06 13:03:45)  Gemcitabine injection What is this medicine? GEMCITABINE (jem SYE ta been) is a chemotherapy drug. This medicine is used to treat many types of cancer like breast cancer, lung cancer, pancreatic cancer, and ovarian cancer. This medicine may be used for other purposes; ask your  health care provider or pharmacist if you have questions. COMMON BRAND NAME(S): Gemzar, Infugem What should I tell my health care provider before I take this medicine? They need to know if you have any of these conditions:  blood disorders  infection  kidney disease  liver disease  lung or breathing disease, like asthma  recent or ongoing radiation therapy  an unusual or allergic reaction to gemcitabine, other chemotherapy, other medicines, foods, dyes, or preservatives  pregnant or trying to get pregnant  breast-feeding How should I use this medicine? This drug is given as an infusion into a vein. It is administered in a hospital or clinic by a specially trained health care professional. Talk to your pediatrician regarding the use of this medicine in children. Special care may be needed. Overdosage: If you think you have taken too much of this medicine contact a poison control center or emergency room at once. NOTE: This medicine is only for you. Do not share this medicine with others. What if I miss a dose? It is important not to miss your dose. Call your doctor or health care professional if you are unable to keep an appointment. What may interact with this medicine?  medicines to increase blood counts like filgrastim, pegfilgrastim, sargramostim  some other chemotherapy drugs like cisplatin  vaccines Talk to your doctor or health care professional before taking any of these medicines:  acetaminophen  aspirin  ibuprofen  ketoprofen  naproxen This list may not describe all possible interactions. Give your health care provider a list of all the medicines, herbs, non-prescription drugs, or dietary supplements you use. Also tell them if you smoke, drink alcohol, or use illegal drugs. Some items may interact with your medicine. What should I watch for while using this medicine? Visit your doctor for checks on your progress. This drug may make you feel generally unwell.  This is not uncommon, as chemotherapy can affect healthy cells as well as cancer cells. Report any side effects. Continue your course of treatment even though you feel ill unless your doctor tells you to stop. In some cases, you may be given additional medicines to help with side effects. Follow all directions for their use. Call your doctor or health care professional for advice if you get a fever, chills or sore throat, or other symptoms of a cold or flu. Do not treat yourself. This drug decreases your body's ability to fight infections. Try to avoid being around people who are sick. This medicine may increase your risk to bruise or bleed. Call your doctor or health care professional if you notice any unusual bleeding. Be careful brushing and flossing your teeth or using a toothpick because you may get an infection or bleed more easily. If you have any dental work done, tell your dentist you are receiving this medicine. Avoid taking products that contain aspirin, acetaminophen, ibuprofen, naproxen, or ketoprofen unless instructed by your doctor. These medicines may hide a fever. Do not become pregnant while taking this   medicine or for 6 months after stopping it. Women should inform their doctor if they wish to become pregnant or think they might be pregnant. Men should not father a child while taking this medicine and for 3 months after stopping it. There is a potential for serious side effects to an unborn child. Talk to your health care professional or pharmacist for more information. Do not breast-feed an infant while taking this medicine or for at least 1 week after stopping it. Men should inform their doctors if they wish to father a child. This medicine may lower sperm counts. Talk with your doctor or health care professional if you are concerned about your fertility. What side effects may I notice from receiving this medicine? Side effects that you should report to your doctor or health care  professional as soon as possible:  allergic reactions like skin rash, itching or hives, swelling of the face, lips, or tongue  breathing problems  pain, redness, or irritation at site where injected  signs and symptoms of a dangerous change in heartbeat or heart rhythm like chest pain; dizziness; fast or irregular heartbeat; palpitations; feeling faint or lightheaded, falls; breathing problems  signs of decreased platelets or bleeding - bruising, pinpoint red spots on the skin, black, tarry stools, blood in the urine  signs of decreased red blood cells - unusually weak or tired, feeling faint or lightheaded, falls  signs of infection - fever or chills, cough, sore throat, pain or difficulty passing urine  signs and symptoms of kidney injury like trouble passing urine or change in the amount of urine  signs and symptoms of liver injury like dark yellow or brown urine; general ill feeling or flu-like symptoms; light-colored stools; loss of appetite; nausea; right upper belly pain; unusually weak or tired; yellowing of the eyes or skin  swelling of ankles, feet, hands Side effects that usually do not require medical attention (report to your doctor or health care professional if they continue or are bothersome):  constipation  diarrhea  hair loss  loss of appetite  nausea  rash  vomiting This list may not describe all possible side effects. Call your doctor for medical advice about side effects. You may report side effects to FDA at 1-800-FDA-1088. Where should I keep my medicine? This drug is given in a hospital or clinic and will not be stored at home. NOTE: This sheet is a summary. It may not cover all possible information. If you have questions about this medicine, talk to your doctor, pharmacist, or health care provider.  2020 Elsevier/Gold Standard (2017-10-27 18:06:11)  

## 2019-08-31 NOTE — Patient Instructions (Signed)

## 2019-09-01 ENCOUNTER — Ambulatory Visit: Payer: Medicare Other | Admitting: Oncology

## 2019-09-01 ENCOUNTER — Telehealth: Payer: Self-pay | Admitting: *Deleted

## 2019-09-01 ENCOUNTER — Other Ambulatory Visit: Payer: Medicare Other

## 2019-09-01 LAB — CANCER ANTIGEN 19-9: CA 19-9: 16067 U/mL — ABNORMAL HIGH (ref 0–35)

## 2019-09-01 NOTE — Telephone Encounter (Signed)
Called pt to see how she did with her treatment yest.  She reports doing well & denies any problems other than some itching at incision but states no redness, tenderness, fever, or drainage.  She knows how to reach [place] & will call with any concerns.

## 2019-09-01 NOTE — Telephone Encounter (Signed)
-----   Message from Scot Dock, RN sent at 08/31/2019  4:41 PM EST ----- Regarding: 1st time chemo follow up Dr. Benay Spice

## 2019-09-04 ENCOUNTER — Telehealth: Payer: Self-pay | Admitting: Oncology

## 2019-09-04 NOTE — Telephone Encounter (Signed)
Scheduled per los. Called and left msg. Mailed printout  °

## 2019-09-06 ENCOUNTER — Telehealth: Payer: Self-pay | Admitting: *Deleted

## 2019-09-06 NOTE — Telephone Encounter (Signed)
Requesting refill on oxycodone and requesting to renew her script for Linzess that she has used in the past and worked better than the [employer]. Taking 1 capful daily w/fluid and stools are still difficult.

## 2019-09-07 ENCOUNTER — Other Ambulatory Visit: Payer: Self-pay | Admitting: Nurse Practitioner

## 2019-09-07 DIAGNOSIS — C241 Malignant neoplasm of ampulla of Vater: Secondary | ICD-10-CM

## 2019-09-07 MED ORDER — OXYCODONE-ACETAMINOPHEN 5-325 MG PO TABS: 1.0000 | ORAL_TABLET | Freq: Three times a day (TID) | ORAL | 0 refills | Status: DC | PRN

## 2019-09-07 NOTE — Telephone Encounter (Signed)
Notified patient that her oxycodone was refilled today and that she needs to contact the original prescribing physician for the [place].

## 2019-09-09 ENCOUNTER — Other Ambulatory Visit: Payer: Self-pay | Admitting: Oncology

## 2019-09-14 ENCOUNTER — Inpatient Hospital Stay: Payer: Medicare Other | Admitting: Nurse Practitioner

## 2019-09-14 ENCOUNTER — Inpatient Hospital Stay: Payer: Medicare Other

## 2019-09-14 ENCOUNTER — Other Ambulatory Visit: Payer: Self-pay

## 2019-09-14 ENCOUNTER — Encounter: Payer: Self-pay | Admitting: Nurse Practitioner

## 2019-09-14 VITALS — BP 145/71 | HR 63 | Temp 98.0°F | Resp 18 | Ht 64.0 in | Wt 188.8 lb

## 2019-09-14 DIAGNOSIS — C241 Malignant neoplasm of ampulla of Vater: Secondary | ICD-10-CM

## 2019-09-14 DIAGNOSIS — Z95828 Presence of other vascular implants and grafts: Secondary | ICD-10-CM | POA: Insufficient documentation

## 2019-09-14 DIAGNOSIS — C801 Malignant (primary) neoplasm, unspecified: Secondary | ICD-10-CM

## 2019-09-14 DIAGNOSIS — Z5111 Encounter for antineoplastic chemotherapy: Secondary | ICD-10-CM | POA: Diagnosis not present

## 2019-09-14 LAB — CBC WITH DIFFERENTIAL (CANCER CENTER ONLY)
Abs Immature Granulocytes: 0.02 10*3/uL (ref 0.00–0.07)
Basophils Absolute: 0 10*3/uL (ref 0.0–0.1)
Basophils Relative: 0 %
Eosinophils Absolute: 0 10*3/uL (ref 0.0–0.5)
Eosinophils Relative: 0 %
HCT: 35.8 % — ABNORMAL LOW (ref 36.0–46.0)
Hemoglobin: 11.5 g/dL — ABNORMAL LOW (ref 12.0–15.0)
Immature Granulocytes: 0 %
Lymphocytes Relative: 32 %
Lymphs Abs: 1.7 10*3/uL (ref 0.7–4.0)
MCH: 31.4 pg (ref 26.0–34.0)
MCHC: 32.1 g/dL (ref 30.0–36.0)
MCV: 97.8 fL (ref 80.0–100.0)
Monocytes Absolute: 0.3 10*3/uL (ref 0.1–1.0)
Monocytes Relative: 6 %
Neutro Abs: 3.1 10*3/uL (ref 1.7–7.7)
Neutrophils Relative %: 62 %
Platelet Count: 329 10*3/uL (ref 150–400)
RBC: 3.66 MIL/uL — ABNORMAL LOW (ref 3.87–5.11)
RDW: 15.3 % (ref 11.5–15.5)
WBC Count: 5.1 10*3/uL (ref 4.0–10.5)
nRBC: 0 % (ref 0.0–0.2)

## 2019-09-14 LAB — CMP (CANCER CENTER ONLY)
ALT: 31 U/L (ref 0–44)
AST: 40 U/L (ref 15–41)
Albumin: 3.5 g/dL (ref 3.5–5.0)
Alkaline Phosphatase: 103 U/L (ref 38–126)
Anion gap: 8 (ref 5–15)
BUN: 26 mg/dL — ABNORMAL HIGH (ref 8–23)
CO2: 26 mmol/L (ref 22–32)
Calcium: 8.7 mg/dL — ABNORMAL LOW (ref 8.9–10.3)
Chloride: 108 mmol/L (ref 98–111)
Creatinine: 1 mg/dL (ref 0.44–1.00)
GFR, Est AFR Am: >60 mL/min (ref >60)
GFR, Estimated: 53 mL/min — ABNORMAL LOW (ref >60)
Glucose, Bld: 163 mg/dL — ABNORMAL HIGH (ref 70–99)
Potassium: 3.9 mmol/L (ref 3.5–5.1)
Sodium: 142 mmol/L (ref 135–145)
Total Bilirubin: 0.6 mg/dL (ref 0.3–1.2)
Total Protein: 7 g/dL (ref 6.5–8.1)

## 2019-09-14 MED ORDER — PACLITAXEL PROTEIN-BOUND CHEMO INJECTION 100 MG
100.0000 mg/m2 | Freq: Once | INTRAVENOUS | Status: AC
  Administered 2019-09-14: 200 mg via INTRAVENOUS
  Filled 2019-09-14: qty 40

## 2019-09-14 MED ORDER — PROCHLORPERAZINE MALEATE 10 MG PO TABS
10.0000 mg | ORAL_TABLET | Freq: Once | ORAL | Status: AC
  Administered 2019-09-14: 10 mg via ORAL

## 2019-09-14 MED ORDER — SODIUM CHLORIDE 0.9 % IV SOLN
Freq: Once | INTRAVENOUS | Status: AC
  Filled 2019-09-14: qty 250

## 2019-09-14 MED ORDER — SODIUM CHLORIDE 0.9 % IV SOLN
800.0000 mg/m2 | Freq: Once | INTRAVENOUS | Status: AC
  Administered 2019-09-14: 1558 mg via INTRAVENOUS
  Filled 2019-09-14: qty 40.98

## 2019-09-14 MED ORDER — PROCHLORPERAZINE MALEATE 10 MG PO TABS
ORAL_TABLET | ORAL | Status: AC
  Filled 2019-09-14: qty 1

## 2019-09-14 MED ORDER — SODIUM CHLORIDE 0.9% FLUSH
10.0000 mL | Freq: Once | INTRAVENOUS | Status: AC
  Administered 2019-09-14: 10 mL
  Filled 2019-09-14: qty 10

## 2019-09-14 MED ORDER — HEPARIN SOD (PORK) LOCK FLUSH 100 UNIT/ML IV SOLN
500.0000 [IU] | Freq: Once | INTRAVENOUS | Status: AC | PRN
  Administered 2019-09-14: 500 [IU]
  Filled 2019-09-14: qty 5

## 2019-09-14 MED ORDER — SODIUM CHLORIDE 0.9% FLUSH
10.0000 mL | INTRAVENOUS | Status: DC | PRN
  Administered 2019-09-14: 10 mL
  Filled 2019-09-14: qty 10

## 2019-09-14 NOTE — Patient Instructions (Signed)
[facility] Discharge Instructions for Patients Receiving Chemotherapy  Today you received the following chemotherapy agents: Abraxane, Gemzar   To help prevent nausea and vomiting after your treatment, we encourage you to take your nausea medication as directed.    If you develop nausea and vomiting that is not controlled by your nausea medication, call the clinic.   BELOW ARE SYMPTOMS THAT SHOULD BE REPORTED IMMEDIATELY:  *FEVER GREATER THAN 100.5 F  *CHILLS WITH OR WITHOUT FEVER  NAUSEA AND VOMITING THAT IS NOT CONTROLLED WITH YOUR NAUSEA MEDICATION  *UNUSUAL SHORTNESS OF BREATH  *UNUSUAL BRUISING OR BLEEDING  TENDERNESS IN MOUTH AND THROAT WITH OR WITHOUT PRESENCE OF ULCERS  *URINARY PROBLEMS  *BOWEL PROBLEMS  UNUSUAL RASH Items with * indicate a potential emergency and should be followed up as soon as possible.  Feel free to call the clinic should you have any questions or concerns. The clinic phone number is [phone].  Please show the CHEMO ALERT CARD at check-in to the Emergency Department and triage nurse.   

## 2019-09-14 NOTE — Progress Notes (Signed)
[facility] OFFICE PROGRESS NOTE   Diagnosis: Ampullary carcinoma  INTERVAL HISTORY:   Kaylee Mullins returns as scheduled.  She completed cycle 1 gemcitabine/Abraxane 08/31/2019.  She denies nausea/vomiting.  No mouth sores.  No diarrhea.  No fever or rash.  She denies shortness of breath.  Pain overall is better.  Objective:  Vital signs in last 24 hours:  Blood pressure (!) 145/71, pulse 63, temperature 98 F (36.7 C), temperature source Temporal, resp. rate 18, height 5\' 4"  (1.626 m), weight 188 lb 12.8 oz (85.6 kg), SpO2 98 %.    HEENT: No thrush or ulcers. GI: Abdomen soft and nontender.  No hepatomegaly. Vascular: Trace edema at the lower legs bilaterally.  Skin: No rash. Port-A-Cath without erythema.   Lab Results:  Lab Results  Component Value Date   WBC 5.1 09/14/2019   HGB 11.5 (L) 09/14/2019   HCT 35.8 (L) 09/14/2019   MCV 97.8 09/14/2019   PLT 329 09/14/2019   NEUTROABS 3.1 09/14/2019    Imaging:  No results found.  Medications: I have reviewed the patient's current medications.  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 CT abdomen/pelvis 10/23/2016-ampullary mass, single mildly enlarged porta hepatis lymph node, no evidence of distant metastatic disease ? Status post pancreaticoduodenectomy 01/12/2017; pT3pN2 ? CT abdomen/pelvis 03/30/2018-ablation defect within the upper pole of the right kidney. No evidence of recurrent renal mass. New mild left periaortic retroperitoneal lymphadenopathy. ? CT abdomen/pelvis 06/30/2018-6 mm right middle lobe nodule-slowly growing over multiple CTs, stable ablation defect in the right kidney, 1.9 cm left periaortic node increased from 1.2 cm, stable 1.5 cm porta hepatis node ? CT abdomen/pelvis 09/17/2018-surgical changes related to Whipple procedure. Stable upper abdominal/retroperitoneal  lymphadenopathy. 5 mm right middle lobe nodule, stable from most recent CT but mildly progressive from priors. Postprocedural changes related to renal ablation in the medial right upper kidney. ? CTs 03/24/2019-a few scattered pulmonary nodules appears stable; liver has a slightly shrunken appearance and nodular appearance compatible with mild cirrhosis. No suspicious cystic or solid hepatic lesions. Upper abdominal and retroperitoneal lymphadenopathy appears similar compared to the prior study. No other new lymphadenopathy noted elsewhere in the abdomen or pelvis. ? PET scan 07/03/2019-hypermetabolic lymph node adjacent porta hepatis. Newfocal hypermetabolic lesion in the liver. Hypermetabolic activity in the left suprarenal location favored metastatic adenopathy in the retroperitoneum. Diffuse hypermetabolic activity within the stomach favor gastritis. ? Cycle 1 gemcitabine/Abraxane 08/31/2019 ? Cycle 2 gemcitabine/Abraxane 09/14/2019 2. Biliary obstruction secondary to #1, status post placement of a metal, bile duct stent on 10/23/2016  3. 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 Renal cell carcinoma-status post ablation of a right renal mass 07/15/2016, biopsy confirmed papillary renal cell carcinoma, Fuhrman grade 3  5. CVA in 2016  6. History of thyroid cancer-status post thyroidectomy and radioactive iodine in 2005  7. Diabetes  8.Hypertension  9.Left lower extremity deep vein thrombosis June 2019, IVC filter placed July 2018 after she was diagnosed with a rectus hematoma while on Lovenox  10. Hospitalized 04/12/2019 through 04/14/2019 with traumatic hematoma right lower leg, followed at the wound clinic  11.   Port-A-Cath placement interventional radiology 08/29/2019   Disposition: [name] appears stable.  She tolerated the first cycle of gemcitabine/Abraxane well.  Plan to proceed with cycle 2 today as scheduled.  We  reviewed the CBC from today.  Counts are adequate to proceed with treatment.  She will  return for lab, follow-up, cycle 3 gemcitabine/Abraxane in 2 weeks.  She will contact the office in the interim with any problems.    [name] ANP/GNP-BC   09/14/2019  2:55 PM

## 2019-09-15 LAB — CANCER ANTIGEN 19-9: CA 19-9: 11191 U/mL — ABNORMAL HIGH (ref 0–35)

## 2019-09-19 ENCOUNTER — Telehealth: Payer: Self-pay | Admitting: Oncology

## 2019-09-19 NOTE — Telephone Encounter (Signed)
Scheduled per los. Called and left msg about added appts. Mailed printout  

## 2019-09-24 ENCOUNTER — Other Ambulatory Visit: Payer: Self-pay | Admitting: Oncology

## 2019-09-29 ENCOUNTER — Inpatient Hospital Stay: Payer: Medicare Other

## 2019-09-29 ENCOUNTER — Telehealth: Payer: Self-pay

## 2019-09-29 ENCOUNTER — Other Ambulatory Visit: Payer: Self-pay | Admitting: *Deleted

## 2019-09-29 ENCOUNTER — Inpatient Hospital Stay: Payer: Medicare Other | Attending: Internal Medicine | Admitting: Oncology

## 2019-09-29 ENCOUNTER — Other Ambulatory Visit: Payer: Self-pay

## 2019-09-29 VITALS — BP 139/63 | HR 55 | Temp 98.2°F | Resp 20 | Ht 64.0 in | Wt 191.4 lb

## 2019-09-29 DIAGNOSIS — Z8673 Personal history of transient ischemic attack (TIA), and cerebral infarction without residual deficits: Secondary | ICD-10-CM | POA: Insufficient documentation

## 2019-09-29 DIAGNOSIS — Z95828 Presence of other vascular implants and grafts: Secondary | ICD-10-CM

## 2019-09-29 DIAGNOSIS — Z5111 Encounter for antineoplastic chemotherapy: Secondary | ICD-10-CM | POA: Diagnosis present

## 2019-09-29 DIAGNOSIS — F3289 Other specified depressive episodes: Secondary | ICD-10-CM | POA: Insufficient documentation

## 2019-09-29 DIAGNOSIS — R630 Anorexia: Secondary | ICD-10-CM | POA: Diagnosis not present

## 2019-09-29 DIAGNOSIS — C241 Malignant neoplasm of ampulla of Vater: Secondary | ICD-10-CM

## 2019-09-29 DIAGNOSIS — R600 Localized edema: Secondary | ICD-10-CM | POA: Diagnosis not present

## 2019-09-29 DIAGNOSIS — E119 Type 2 diabetes mellitus without complications: Secondary | ICD-10-CM | POA: Insufficient documentation

## 2019-09-29 DIAGNOSIS — E890 Postprocedural hypothyroidism: Secondary | ICD-10-CM | POA: Insufficient documentation

## 2019-09-29 DIAGNOSIS — Z85850 Personal history of malignant neoplasm of thyroid: Secondary | ICD-10-CM | POA: Diagnosis not present

## 2019-09-29 DIAGNOSIS — I10 Essential (primary) hypertension: Secondary | ICD-10-CM | POA: Insufficient documentation

## 2019-09-29 DIAGNOSIS — C801 Malignant (primary) neoplasm, unspecified: Secondary | ICD-10-CM

## 2019-09-29 DIAGNOSIS — Z86718 Personal history of other venous thrombosis and embolism: Secondary | ICD-10-CM | POA: Insufficient documentation

## 2019-09-29 LAB — CBC WITH DIFFERENTIAL (CANCER CENTER ONLY)
Abs Immature Granulocytes: 0.04 10*3/uL (ref 0.00–0.07)
Basophils Absolute: 0 10*3/uL (ref 0.0–0.1)
Basophils Relative: 1 %
Eosinophils Absolute: 0 10*3/uL (ref 0.0–0.5)
Eosinophils Relative: 1 %
HCT: 32.5 % — ABNORMAL LOW (ref 36.0–46.0)
Hemoglobin: 10.5 g/dL — ABNORMAL LOW (ref 12.0–15.0)
Immature Granulocytes: 1 %
Lymphocytes Relative: 29 %
Lymphs Abs: 1.5 10*3/uL (ref 0.7–4.0)
MCH: 31.7 pg (ref 26.0–34.0)
MCHC: 32.3 g/dL (ref 30.0–36.0)
MCV: 98.2 fL (ref 80.0–100.0)
Monocytes Absolute: 0.5 10*3/uL (ref 0.1–1.0)
Monocytes Relative: 9 %
Neutro Abs: 3.2 10*3/uL (ref 1.7–7.7)
Neutrophils Relative %: 59 %
Platelet Count: 167 10*3/uL (ref 150–400)
RBC: 3.31 MIL/uL — ABNORMAL LOW (ref 3.87–5.11)
RDW: 15 % (ref 11.5–15.5)
WBC Count: 5.3 10*3/uL (ref 4.0–10.5)
nRBC: 0 % (ref 0.0–0.2)

## 2019-09-29 LAB — CMP (CANCER CENTER ONLY)
ALT: 29 U/L (ref 0–44)
AST: 37 U/L (ref 15–41)
Albumin: 3.4 g/dL — ABNORMAL LOW (ref 3.5–5.0)
Alkaline Phosphatase: 93 U/L (ref 38–126)
Anion gap: 7 (ref 5–15)
BUN: 26 mg/dL — ABNORMAL HIGH (ref 8–23)
CO2: 27 mmol/L (ref 22–32)
Calcium: 8.2 mg/dL — ABNORMAL LOW (ref 8.9–10.3)
Chloride: 110 mmol/L (ref 98–111)
Creatinine: 0.95 mg/dL (ref 0.44–1.00)
GFR, Est AFR Am: >60 mL/min (ref >60)
GFR, Estimated: 56 mL/min — ABNORMAL LOW (ref >60)
Glucose, Bld: 81 mg/dL (ref 70–99)
Potassium: 3.2 mmol/L — ABNORMAL LOW (ref 3.5–5.1)
Sodium: 144 mmol/L (ref 135–145)
Total Bilirubin: 0.6 mg/dL (ref 0.3–1.2)
Total Protein: 6.5 g/dL (ref 6.5–8.1)

## 2019-09-29 MED ORDER — SODIUM CHLORIDE 0.9% FLUSH
10.0000 mL | Freq: Once | INTRAVENOUS | Status: AC
  Administered 2019-09-29: 10 mL
  Filled 2019-09-29: qty 10

## 2019-09-29 MED ORDER — VITAMIN D (ERGOCALCIFEROL) 1.25 MG (50000 UNIT) PO CAPS: 50000.0000 [IU] | ORAL_CAPSULE | ORAL | 5 refills | Status: DC

## 2019-09-29 MED ORDER — PACLITAXEL PROTEIN-BOUND CHEMO INJECTION 100 MG
100.0000 mg/m2 | Freq: Once | INTRAVENOUS | Status: AC
  Administered 2019-09-29: 200 mg via INTRAVENOUS
  Filled 2019-09-29: qty 40

## 2019-09-29 MED ORDER — SODIUM CHLORIDE 0.9 % IV SOLN
Freq: Once | INTRAVENOUS | Status: AC
  Filled 2019-09-29: qty 250

## 2019-09-29 MED ORDER — PROCHLORPERAZINE MALEATE 10 MG PO TABS
ORAL_TABLET | ORAL | Status: AC
  Filled 2019-09-29: qty 1

## 2019-09-29 MED ORDER — SODIUM CHLORIDE 0.9% FLUSH
10.0000 mL | INTRAVENOUS | Status: DC | PRN
  Administered 2019-09-29: 12:00:00 10 mL
  Filled 2019-09-29: qty 10

## 2019-09-29 MED ORDER — HEPARIN SOD (PORK) LOCK FLUSH 100 UNIT/ML IV SOLN
500.0000 [IU] | Freq: Once | INTRAVENOUS | Status: AC | PRN
  Administered 2019-09-29: 12:00:00 500 [IU]
  Filled 2019-09-29: qty 5

## 2019-09-29 MED ORDER — SODIUM CHLORIDE 0.9 % IV SOLN
800.0000 mg/m2 | Freq: Once | INTRAVENOUS | Status: AC
  Administered 2019-09-29: 1558 mg via INTRAVENOUS
  Filled 2019-09-29: qty 40.98

## 2019-09-29 MED ORDER — PROCHLORPERAZINE MALEATE 10 MG PO TABS
10.0000 mg | ORAL_TABLET | Freq: Once | ORAL | Status: AC
  Administered 2019-09-29: 10 mg via ORAL

## 2019-09-29 NOTE — Patient Instructions (Signed)

## 2019-09-29 NOTE — Patient Instructions (Signed)
[facility] Discharge Instructions for Patients Receiving Chemotherapy  Today you received the following chemotherapy agents: Paclitaxel protein-bound (Abraxane) and Gemcitabine (Gemzar)  To help prevent nausea and vomiting after your treatment, we encourage you to take your nausea medication as directed.   If you develop nausea and vomiting that is not controlled by your nausea medication, call the clinic.   BELOW ARE SYMPTOMS THAT SHOULD BE REPORTED IMMEDIATELY:  *FEVER GREATER THAN 100.5 F  *CHILLS WITH OR WITHOUT FEVER  NAUSEA AND VOMITING THAT IS NOT CONTROLLED WITH YOUR NAUSEA MEDICATION  *UNUSUAL SHORTNESS OF BREATH  *UNUSUAL BRUISING OR BLEEDING  TENDERNESS IN MOUTH AND THROAT WITH OR WITHOUT PRESENCE OF ULCERS  *URINARY PROBLEMS  *BOWEL PROBLEMS  UNUSUAL RASH Items with * indicate a potential emergency and should be followed up as soon as possible.  Feel free to call the clinic should you have any questions or concerns. The clinic phone number is [phone].  Please show the CHEMO ALERT CARD at check-in to the Emergency Department and triage nurse.  Coronavirus (COVID-19) Are you at risk?  Are you at risk for the Coronavirus (COVID-19)?  To be considered HIGH RISK for Coronavirus (COVID-19), you have to meet the following criteria:  . Traveled to [place], [place], [place], [place] or [place]; or in the [place] to [place], [place], [place], or [place]; and have fever, cough, and shortness of breath within the last 2 weeks of travel OR . Been in close contact with a person diagnosed with COVID-19 within the last 2 weeks and have fever, cough, and shortness of breath . IF YOU DO NOT MEET THESE CRITERIA, YOU ARE CONSIDERED LOW RISK FOR COVID-19.  What to do if you are HIGH RISK for COVID-19?  . If you are having a medical emergency, call 911. . Seek medical care right away. Before you go to a doctor's office, urgent care or emergency  department, call ahead and tell them about your recent travel, contact with someone diagnosed with COVID-19, and your symptoms. You should receive instructions from your physician's office regarding next steps of care.  . When you arrive at healthcare provider, tell the healthcare staff immediately you have returned from visiting [place], [place], [place], [place] or [place]; or traveled in the [place] to [place], [place], [place], or [place]; in the last two weeks or you have been in close contact with a person diagnosed with COVID-19 in the last 2 weeks.   . Tell the health care staff about your symptoms: fever, cough and shortness of breath. . After you have been seen by a medical provider, you will be either: o Tested for (COVID-19) and discharged home on quarantine except to seek medical care if symptoms worsen, and asked to  - Stay home and avoid contact with others until you get your results (4-5 days)  - Avoid travel on public transportation if possible (such as bus, train, or airplane) or o Sent to the Emergency Department by EMS for evaluation, COVID-19 testing, and possible admission depending on your condition and test results.  What to do if you are LOW RISK for COVID-19?  Reduce your risk of any infection by using the same precautions used for avoiding the common cold or flu:  . Wash your hands often with soap and warm water for at least 20 seconds.  If soap and water are not readily available, use an alcohol-based hand sanitizer with at least 60% alcohol.  .   If coughing or sneezing, cover your mouth and nose by coughing or sneezing into the elbow areas of your shirt or coat, into a tissue or into your sleeve (not your hands). . Avoid shaking hands with others and consider head nods or verbal greetings only. . Avoid touching your eyes, nose, or mouth with unwashed hands.  . Avoid close contact with people who are sick. . Avoid places or events with large numbers of people  in one location, like concerts or sporting events. . Carefully consider travel plans you have or are making. . If you are planning any travel outside or inside the [place], visit the CDC's Travelers' Health webpage for the latest health notices. . If you have some symptoms but not all symptoms, continue to monitor at home and seek medical attention if your symptoms worsen. . If you are having a medical emergency, call 911.   ADDITIONAL HEALTHCARE OPTIONS FOR PATIENTS  [facility] Telehealth / e-Visit: [email or web address]         [facility]: [phone]  [facility]: [phone]                   [facility]: [phone]   

## 2019-09-29 NOTE — Telephone Encounter (Signed)
TC to pt per [name] NP to find out if she is taking potassium. If so what is the current dose? Patient stated that she hasn't taken any in about 2-3 weeks. I let her know that Kaylee Mullins would like for her to start taking 1 tab daily. Patient verbalized understanding. No further problems or concerns at this time.

## 2019-09-29 NOTE — Progress Notes (Signed)
[facility] OFFICE PROGRESS NOTE   Diagnosis: Pancreas cancer  INTERVAL HISTORY:   Kaylee Mullins completed another treatment with gemcitabine/Abraxane on 09/14/2019.  No nausea, rash, fever, or neuropathy symptoms.  She reports improvement in the left abdomen/back pain.  She is no longer taking pain medication on a consistent basis.  She has not been taking Lasix and potassium.  Objective:  Vital signs in last 24 hours:  Blood pressure 139/63, pulse (!) 55, temperature 98.2 F (36.8 C), temperature source Temporal, resp. rate 20, height 5\' 4"  (1.626 m), weight 191 lb 6.4 oz (86.8 kg), SpO2 100 %.  Resp: Inspiratory rhonchi with decreased breath sounds at the left greater than right lower posterior chest, no respiratory distress Cardio: Regular rate and rhythm GI: No hepatosplenomegaly, no mass, nontender Vascular: Trace-1+ edema at the left greater than right lower leg   Portacath/PICC-without erythema  Lab Results:  Lab Results  Component Value Date   WBC 5.3 09/29/2019   HGB 10.5 (L) 09/29/2019   HCT 32.5 (L) 09/29/2019   MCV 98.2 09/29/2019   PLT 167 09/29/2019   NEUTROABS 3.2 09/29/2019    CMP  Lab Results  Component Value Date   NA 144 09/29/2019   K 3.2 (L) 09/29/2019   CL 110 09/29/2019   CO2 27 09/29/2019   GLUCOSE 81 09/29/2019   BUN 26 (H) 09/29/2019   CREATININE 0.95 09/29/2019   CALCIUM 8.2 (L) 09/29/2019   PROT 6.5 09/29/2019   ALBUMIN 3.4 (L) 09/29/2019   AST 37 09/29/2019   ALT 29 09/29/2019   ALKPHOS 93 09/29/2019   BILITOT 0.6 09/29/2019   GFRNONAA 56 (L) 09/29/2019   GFRAA >60 09/29/2019    No results found for: CEA1  Lab Results  Component Value Date   INR 1.2 08/29/2019    Imaging:  No results found.  Medications: I have reviewed the patient's current medications.   Assessment/Plan: 1. Ampullary carcinoma-ERCP with bile duct brushing and biopsy of a major papilla mass on 10/16/2016 confirmed  adenocarcinoma ? CT abdomen/pelvis 10/23/2016-ampullary mass, single mildly enlarged porta hepatis lymph node, no evidence of distant metastatic disease ? Status post pancreaticoduodenectomy 01/12/2017; pT3pN2 ? CT abdomen/pelvis 03/30/2018-ablation defect within the upper pole of the right kidney. No evidence of recurrent renal mass. New mild left periaortic retroperitoneal lymphadenopathy. ? CT abdomen/pelvis 06/30/2018-6 mm right middle lobe nodule-slowly growing over multiple CTs, stable ablation defect in the right kidney, 1.9 cm left periaortic node increased from 1.2 cm, stable 1.5 cm porta hepatis node ? CT abdomen/pelvis 09/17/2018-surgical changes related to Whipple procedure. Stable upper abdominal/retroperitoneal lymphadenopathy. 5 mm right middle lobe nodule, stable from most recent CT but mildly progressive from priors. Postprocedural changes related to renal ablation in the medial right upper kidney. ? CTs 03/24/2019-a few scattered pulmonary nodules appears stable; liver has a slightly shrunken appearance and nodular appearance compatible with mild cirrhosis. No suspicious cystic or solid hepatic lesions. Upper abdominal and retroperitoneal lymphadenopathy appears similar compared to the prior study. No other new lymphadenopathy noted elsewhere in the abdomen or pelvis. ? PET scan 07/03/2019-hypermetabolic lymph node adjacent porta hepatis. Newfocal hypermetabolic lesion in the liver. Hypermetabolic activity in the left suprarenal location favored metastatic adenopathy in the retroperitoneum. Diffuse hypermetabolic activity within the stomach favor gastritis. ? Cycle 1 gemcitabine/Abraxane 08/31/2019 ? Cycle 2 gemcitabine/Abraxane 09/14/2019 ? Cycle 3 gemcitabine/Abraxane 09/29/2019 2. Biliary obstruction secondary to #1, status post placement of a metal, bile duct stent on 10/23/2016  3. Cystic pancreas lesions-stable on  the CT 10/23/2016  4. Renal cell carcinoma-status  post ablation of a right renal mass 07/15/2016, biopsy confirmed papillary renal cell carcinoma, Fuhrman grade 3  5. CVA in 2016  6. History of thyroid cancer-status post thyroidectomy and radioactive iodine in 2005  7. Diabetes  8.Hypertension  9.Left lower extremity deep vein thrombosis June 2019, IVC filter placed July 2018 after she was diagnosed with a rectus hematoma while on Lovenox  10. Hospitalized 04/12/2019 through 04/14/2019 with traumatic hematoma right lower leg, followed at the wound clinic  11.   Port-A-Cath placement interventional radiology 08/29/2019     Disposition: Kaylee Mullins has tolerated gemcitabine/Abraxane well.  She will complete cycle 3 today.  We will follow-up on the CA 19-9 from today. I am encouraged the abdomen/flank pain has improved.  We recommended she resume furosemide and potassium.  Kaylee Mullins will schedule a COVID-19 vaccine.  Betsy Coder, MD  09/29/2019  9:42 AM

## 2019-09-30 LAB — CANCER ANTIGEN 19-9: CA 19-9: 7802 U/mL — ABNORMAL HIGH (ref 0–35)

## 2019-10-03 ENCOUNTER — Telehealth: Payer: Self-pay | Admitting: Oncology

## 2019-10-03 NOTE — Telephone Encounter (Signed)
Scheduled per los. Called and left msg. Mailed printout  °

## 2019-10-07 ENCOUNTER — Other Ambulatory Visit: Payer: Self-pay | Admitting: Oncology

## 2019-10-13 ENCOUNTER — Other Ambulatory Visit: Payer: Self-pay

## 2019-10-13 ENCOUNTER — Inpatient Hospital Stay: Payer: Medicare Other

## 2019-10-13 ENCOUNTER — Inpatient Hospital Stay: Payer: Medicare Other | Admitting: Oncology

## 2019-10-13 VITALS — BP 132/89 | HR 55 | Temp 97.8°F | Resp 18 | Ht 64.0 in | Wt 187.9 lb

## 2019-10-13 DIAGNOSIS — C241 Malignant neoplasm of ampulla of Vater: Secondary | ICD-10-CM

## 2019-10-13 DIAGNOSIS — Z5111 Encounter for antineoplastic chemotherapy: Secondary | ICD-10-CM | POA: Diagnosis not present

## 2019-10-13 DIAGNOSIS — C801 Malignant (primary) neoplasm, unspecified: Secondary | ICD-10-CM

## 2019-10-13 DIAGNOSIS — Z95828 Presence of other vascular implants and grafts: Secondary | ICD-10-CM

## 2019-10-13 LAB — CBC WITH DIFFERENTIAL (CANCER CENTER ONLY)
Abs Immature Granulocytes: 0.02 10*3/uL (ref 0.00–0.07)
Basophils Absolute: 0 10*3/uL (ref 0.0–0.1)
Basophils Relative: 0 %
Eosinophils Absolute: 0.1 10*3/uL (ref 0.0–0.5)
Eosinophils Relative: 1 %
HCT: 32.5 % — ABNORMAL LOW (ref 36.0–46.0)
Hemoglobin: 10.4 g/dL — ABNORMAL LOW (ref 12.0–15.0)
Immature Granulocytes: 0 %
Lymphocytes Relative: 27 %
Lymphs Abs: 1.4 10*3/uL (ref 0.7–4.0)
MCH: 31.5 pg (ref 26.0–34.0)
MCHC: 32 g/dL (ref 30.0–36.0)
MCV: 98.5 fL (ref 80.0–100.0)
Monocytes Absolute: 0.5 10*3/uL (ref 0.1–1.0)
Monocytes Relative: 9 %
Neutro Abs: 3.1 10*3/uL (ref 1.7–7.7)
Neutrophils Relative %: 63 %
Platelet Count: 240 10*3/uL (ref 150–400)
RBC: 3.3 MIL/uL — ABNORMAL LOW (ref 3.87–5.11)
RDW: 15.5 % (ref 11.5–15.5)
WBC Count: 5 10*3/uL (ref 4.0–10.5)
nRBC: 0 % (ref 0.0–0.2)

## 2019-10-13 LAB — CMP (CANCER CENTER ONLY)
ALT: 38 U/L (ref 0–44)
AST: 35 U/L (ref 15–41)
Albumin: 3.4 g/dL — ABNORMAL LOW (ref 3.5–5.0)
Alkaline Phosphatase: 98 U/L (ref 38–126)
Anion gap: 8 (ref 5–15)
BUN: 19 mg/dL (ref 8–23)
CO2: 24 mmol/L (ref 22–32)
Calcium: 8.1 mg/dL — ABNORMAL LOW (ref 8.9–10.3)
Chloride: 112 mmol/L — ABNORMAL HIGH (ref 98–111)
Creatinine: 0.95 mg/dL (ref 0.44–1.00)
GFR, Est AFR Am: >60 mL/min (ref >60)
GFR, Estimated: 56 mL/min — ABNORMAL LOW (ref >60)
Glucose, Bld: 113 mg/dL — ABNORMAL HIGH (ref 70–99)
Potassium: 3.7 mmol/L (ref 3.5–5.1)
Sodium: 144 mmol/L (ref 135–145)
Total Bilirubin: 0.7 mg/dL (ref 0.3–1.2)
Total Protein: 6.6 g/dL (ref 6.5–8.1)

## 2019-10-13 MED ORDER — SODIUM CHLORIDE 0.9% FLUSH
10.0000 mL | INTRAVENOUS | Status: DC | PRN
  Administered 2019-10-13: 10 mL
  Filled 2019-10-13: qty 10

## 2019-10-13 MED ORDER — SODIUM CHLORIDE 0.9 % IV SOLN
800.0000 mg/m2 | Freq: Once | INTRAVENOUS | Status: AC
  Administered 2019-10-13: 1558 mg via INTRAVENOUS
  Filled 2019-10-13: qty 40.98

## 2019-10-13 MED ORDER — PROCHLORPERAZINE MALEATE 10 MG PO TABS
ORAL_TABLET | ORAL | Status: AC
  Filled 2019-10-13: qty 1

## 2019-10-13 MED ORDER — SODIUM CHLORIDE 0.9% FLUSH
10.0000 mL | Freq: Once | INTRAVENOUS | Status: AC
  Administered 2019-10-13: 10 mL
  Filled 2019-10-13: qty 10

## 2019-10-13 MED ORDER — PROCHLORPERAZINE MALEATE 10 MG PO TABS
10.0000 mg | ORAL_TABLET | Freq: Once | ORAL | Status: AC
  Administered 2019-10-13: 10:00:00 10 mg via ORAL

## 2019-10-13 MED ORDER — SODIUM CHLORIDE 0.9 % IV SOLN
Freq: Once | INTRAVENOUS | Status: AC
  Filled 2019-10-13: qty 250

## 2019-10-13 MED ORDER — HEPARIN SOD (PORK) LOCK FLUSH 100 UNIT/ML IV SOLN
500.0000 [IU] | Freq: Once | INTRAVENOUS | Status: AC | PRN
  Administered 2019-10-13: 12:00:00 500 [IU]
  Filled 2019-10-13: qty 5

## 2019-10-13 MED ORDER — PACLITAXEL PROTEIN-BOUND CHEMO INJECTION 100 MG
100.0000 mg/m2 | Freq: Once | INTRAVENOUS | Status: AC
  Administered 2019-10-13: 200 mg via INTRAVENOUS
  Filled 2019-10-13: qty 40

## 2019-10-13 NOTE — Patient Instructions (Signed)
[facility] Discharge Instructions for Patients Receiving Chemotherapy  Today you received the following chemotherapy agents: Paclitaxel protein-bound (Abraxane) and Gemcitabine (Gemzar)  To help prevent nausea and vomiting after your treatment, we encourage you to take your nausea medication as directed.   If you develop nausea and vomiting that is not controlled by your nausea medication, call the clinic.   BELOW ARE SYMPTOMS THAT SHOULD BE REPORTED IMMEDIATELY:  *FEVER GREATER THAN 100.5 F  *CHILLS WITH OR WITHOUT FEVER  NAUSEA AND VOMITING THAT IS NOT CONTROLLED WITH YOUR NAUSEA MEDICATION  *UNUSUAL SHORTNESS OF BREATH  *UNUSUAL BRUISING OR BLEEDING  TENDERNESS IN MOUTH AND THROAT WITH OR WITHOUT PRESENCE OF ULCERS  *URINARY PROBLEMS  *BOWEL PROBLEMS  UNUSUAL RASH Items with * indicate a potential emergency and should be followed up as soon as possible.  Feel free to call the clinic should you have any questions or concerns. The clinic phone number is [phone].  Please show the CHEMO ALERT CARD at check-in to the Emergency Department and triage nurse.  Coronavirus (COVID-19) Are you at risk?  Are you at risk for the Coronavirus (COVID-19)?  To be considered HIGH RISK for Coronavirus (COVID-19), you have to meet the following criteria:  . Traveled to [place], [place], [place], [place] or [place]; or in the [place] to [place], [place], [place], or [place]; and have fever, cough, and shortness of breath within the last 2 weeks of travel OR . Been in close contact with a person diagnosed with COVID-19 within the last 2 weeks and have fever, cough, and shortness of breath . IF YOU DO NOT MEET THESE CRITERIA, YOU ARE CONSIDERED LOW RISK FOR COVID-19.  What to do if you are HIGH RISK for COVID-19?  . If you are having a medical emergency, call 911. . Seek medical care right away. Before you go to a doctor's office, urgent care or emergency  department, call ahead and tell them about your recent travel, contact with someone diagnosed with COVID-19, and your symptoms. You should receive instructions from your physician's office regarding next steps of care.  . When you arrive at healthcare provider, tell the healthcare staff immediately you have returned from visiting [place], [place], [place], [place] or [place]; or traveled in the [place] to [place], [place], [place], or [place]; in the last two weeks or you have been in close contact with a person diagnosed with COVID-19 in the last 2 weeks.   . Tell the health care staff about your symptoms: fever, cough and shortness of breath. . After you have been seen by a medical provider, you will be either: o Tested for (COVID-19) and discharged home on quarantine except to seek medical care if symptoms worsen, and asked to  - Stay home and avoid contact with others until you get your results (4-5 days)  - Avoid travel on public transportation if possible (such as bus, train, or airplane) or o Sent to the Emergency Department by EMS for evaluation, COVID-19 testing, and possible admission depending on your condition and test results.  What to do if you are LOW RISK for COVID-19?  Reduce your risk of any infection by using the same precautions used for avoiding the common cold or flu:  . Wash your hands often with soap and warm water for at least 20 seconds.  If soap and water are not readily available, use an alcohol-based hand sanitizer with at least 60% alcohol.  .   If coughing or sneezing, cover your mouth and nose by coughing or sneezing into the elbow areas of your shirt or coat, into a tissue or into your sleeve (not your hands). . Avoid shaking hands with others and consider head nods or verbal greetings only. . Avoid touching your eyes, nose, or mouth with unwashed hands.  . Avoid close contact with people who are sick. . Avoid places or events with large numbers of people  in one location, like concerts or sporting events. . Carefully consider travel plans you have or are making. . If you are planning any travel outside or inside the [place], visit the CDC's Travelers' Health webpage for the latest health notices. . If you have some symptoms but not all symptoms, continue to monitor at home and seek medical attention if your symptoms worsen. . If you are having a medical emergency, call 911.   ADDITIONAL HEALTHCARE OPTIONS FOR PATIENTS  [facility] Telehealth / e-Visit: [email or web address]         [facility]: [phone]  [facility]: [phone]                   [facility]: [phone]   

## 2019-10-13 NOTE — Progress Notes (Signed)
Message sent to CSW requesting to reach out to patient to discuss her depression.

## 2019-10-13 NOTE — Progress Notes (Signed)
[facility] OFFICE PROGRESS NOTE   Diagnosis: Ampullary carcinoma  INTERVAL HISTORY:   Kaylee Mullins completed another treatment with gemcitabine/Abraxane on 09/29/2019.  No fever, rash, or new neuropathy symptoms.  She has numbness in the left toes after suffering a CVA.  The left abdominal/back pain remains improved.  She has not taken pain medication for the past 2 weeks.  Her appetite is poor.  She feels "depressed ".  Objective:  Vital signs in last 24 hours:  Blood pressure 132/89, pulse (!) 55, temperature 97.8 F (36.6 C), temperature source Temporal, resp. rate 18, height 5\' 4"  (1.626 m), weight 187 lb 14.4 oz (85.2 kg), SpO2 100 %.     GI: No hepatosplenomegaly, no mass, nontender Vascular: The left lower leg is slightly larger than the right side    Portacath/PICC-without erythema  Lab Results:  Lab Results  Component Value Date   WBC 5.0 10/13/2019   HGB 10.4 (L) 10/13/2019   HCT 32.5 (L) 10/13/2019   MCV 98.5 10/13/2019   PLT 240 10/13/2019   NEUTROABS 3.1 10/13/2019    CMP  Lab Results  Component Value Date   NA 144 10/13/2019   K 3.7 10/13/2019   CL 112 (H) 10/13/2019   CO2 24 10/13/2019   GLUCOSE 113 (H) 10/13/2019   BUN 19 10/13/2019   CREATININE 0.95 10/13/2019   CALCIUM 8.1 (L) 10/13/2019   PROT 6.6 10/13/2019   ALBUMIN 3.4 (L) 10/13/2019   AST 35 10/13/2019   ALT 38 10/13/2019   ALKPHOS 98 10/13/2019   BILITOT 0.7 10/13/2019   GFRNONAA 56 (L) 10/13/2019   GFRAA >60 10/13/2019     Medications: I have reviewed the patient's current medications.   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 CT abdomen/pelvis 10/23/2016-ampullary mass, single mildly enlarged porta hepatis lymph node, no evidence of distant metastatic disease ? Status post pancreaticoduodenectomy 01/12/2017; pT3pN2 ? CT abdomen/pelvis 03/30/2018-ablation  defect within the upper pole of the right kidney. No evidence of recurrent renal mass. New mild left periaortic retroperitoneal lymphadenopathy. ? CT abdomen/pelvis 06/30/2018-6 mm right middle lobe nodule-slowly growing over multiple CTs, stable ablation defect in the right kidney, 1.9 cm left periaortic node increased from 1.2 cm, stable 1.5 cm porta hepatis node ? CT abdomen/pelvis 09/17/2018-surgical changes related to Whipple procedure. Stable upper abdominal/retroperitoneal lymphadenopathy. 5 mm right middle lobe nodule, stable from most recent CT but mildly progressive from priors. Postprocedural changes related to renal ablation in the medial right upper kidney. ? CTs 03/24/2019-a few scattered pulmonary nodules appears stable; liver has a slightly shrunken appearance and nodular appearance compatible with mild cirrhosis. No suspicious cystic or solid hepatic lesions. Upper abdominal and retroperitoneal lymphadenopathy appears similar compared to the prior study. No other new lymphadenopathy noted elsewhere in the abdomen or pelvis. ? PET scan 07/03/2019-hypermetabolic lymph node adjacent porta hepatis. Newfocal hypermetabolic lesion in the liver. Hypermetabolic activity in the left suprarenal location favored metastatic adenopathy in the retroperitoneum. Diffuse hypermetabolic activity within the stomach favor gastritis. ? Cycle 1 gemcitabine/Abraxane 08/31/2019 ? Cycle 2 gemcitabine/Abraxane 09/14/2019 ? Cycle 3 gemcitabine/Abraxane 09/29/2019 ? Cycle 4 gemcitabine/Abraxane 10/13/2019 2. Biliary obstruction secondary to #1, status post placement of a metal, bile duct stent on 10/23/2016  3. 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 Renal cell carcinoma-status post ablation of a right renal mass 07/15/2016, biopsy confirmed papillary renal cell carcinoma, Fuhrman grade 3  5. CVA  in 2016  6. History of thyroid cancer-status post thyroidectomy and  radioactive iodine in 2005  7. Diabetes  8.Hypertension  9.Left lower extremity deep vein thrombosis June 2019, IVC filter placed July 2018 after she was diagnosed with a rectus hematoma while on Lovenox  10. Hospitalized 04/12/2019 through 04/14/2019 with traumatic hematoma right lower leg, followed at the wound clinic  11.   Port-A-Cath placement interventional radiology 08/29/2019      Disposition: Kaylee Mullins has completed 3 treatments with gemcitabine/Abraxane.  Her pain has improved.  The CA 19-9 was lower when she was here 2 weeks ago.  She will complete cycle 4 today.  She will return for an office visit and chemotherapy in 2 weeks. We will asked the Cancer center counselor to meet with her regarding depression symptoms.  Betsy Coder, MD  10/13/2019  9:24 AM

## 2019-10-14 LAB — CANCER ANTIGEN 19-9: CA 19-9: 6036 U/mL — ABNORMAL HIGH (ref 0–35)

## 2019-10-17 ENCOUNTER — Encounter: Payer: Self-pay | Admitting: *Deleted

## 2019-10-17 ENCOUNTER — Telehealth: Payer: Self-pay | Admitting: Oncology

## 2019-10-17 ENCOUNTER — Telehealth: Payer: Self-pay | Admitting: *Deleted

## 2019-10-17 NOTE — Progress Notes (Signed)
[facility] Work  Clinical Social Work received referral from medical oncology for depression.  CSW contacted patient at home to offer support and assess for needs.  CSW attempted to reach patient on her home phone and cell phone.  CSW left patient a message offering support and encouraging patient to return call.    Johnnye Lana, MSW, LCSW, OSW-C Clinical Social Worker [facility] [phone]

## 2019-10-17 NOTE — Telephone Encounter (Signed)
Called to report she is getting her COVID vaccine on 10/20/19.

## 2019-10-17 NOTE — Progress Notes (Signed)
[place] Work   Patient returned clinical social worker call.  CSW offered patient support and assessed for needs.  Patient stated she was "feelin better" since her last visit to [facility].  Patient stated that on occasion she does have trouble sleeping at night.  Patient identified past grief of children and family members, living with cancer, facing mortality, and isolation due to covid-19 as contributors to recent depressed feelings.  CSW and patient discussed common feelings and emotions when going through treatment for cancer, and the importance of support.  CSW provided education on support resources and services available at [facility].  Patient identified a very supportive group of family and friends that surround her daily.  CSW and patient also discussed tools and strategies she could use when she has trouble sleeping or depressed feelings.  Patient identified writing and reading her Bible as activities she enjoyed and practiced.  Patient shared the many projects she has been working on at home.  They include creating a historical log of her life, and organizing pictures for her children.  Patient also identified cooking as something she enjoys.  CSW provided patient with contact information, and patient agrees to contact CSW when she feels the need for additional emotional support.  Patient also stated she would "check in" at her next appointment.    Johnnye Lana, MSW, LCSW, OSW-C Clinical Social Worker [facility] [phone]

## 2019-10-17 NOTE — Telephone Encounter (Signed)
Scheduled per los. calle and left msg. Mailed printout

## 2019-10-22 ENCOUNTER — Other Ambulatory Visit: Payer: Self-pay | Admitting: Oncology

## 2019-10-27 ENCOUNTER — Inpatient Hospital Stay: Payer: Medicare Other | Admitting: Nutrition

## 2019-10-27 ENCOUNTER — Telehealth: Payer: Self-pay | Admitting: *Deleted

## 2019-10-27 ENCOUNTER — Encounter: Payer: Self-pay | Admitting: Nutrition

## 2019-10-27 ENCOUNTER — Inpatient Hospital Stay: Payer: Medicare Other

## 2019-10-27 ENCOUNTER — Other Ambulatory Visit: Payer: Self-pay

## 2019-10-27 ENCOUNTER — Inpatient Hospital Stay: Payer: Medicare Other | Attending: Internal Medicine | Admitting: Nurse Practitioner

## 2019-10-27 ENCOUNTER — Other Ambulatory Visit: Payer: Self-pay | Admitting: Nurse Practitioner

## 2019-10-27 ENCOUNTER — Encounter: Payer: Self-pay | Admitting: Nurse Practitioner

## 2019-10-27 VITALS — BP 149/71 | HR 56 | Temp 98.5°F | Resp 17 | Ht 64.0 in | Wt 187.6 lb

## 2019-10-27 DIAGNOSIS — I10 Essential (primary) hypertension: Secondary | ICD-10-CM | POA: Diagnosis not present

## 2019-10-27 DIAGNOSIS — Z8673 Personal history of transient ischemic attack (TIA), and cerebral infarction without residual deficits: Secondary | ICD-10-CM | POA: Insufficient documentation

## 2019-10-27 DIAGNOSIS — Z5111 Encounter for antineoplastic chemotherapy: Secondary | ICD-10-CM | POA: Diagnosis present

## 2019-10-27 DIAGNOSIS — C241 Malignant neoplasm of ampulla of Vater: Secondary | ICD-10-CM

## 2019-10-27 DIAGNOSIS — Z85528 Personal history of other malignant neoplasm of kidney: Secondary | ICD-10-CM | POA: Insufficient documentation

## 2019-10-27 DIAGNOSIS — Z86718 Personal history of other venous thrombosis and embolism: Secondary | ICD-10-CM | POA: Diagnosis not present

## 2019-10-27 DIAGNOSIS — Z85850 Personal history of malignant neoplasm of thyroid: Secondary | ICD-10-CM | POA: Insufficient documentation

## 2019-10-27 DIAGNOSIS — Z7901 Long term (current) use of anticoagulants: Secondary | ICD-10-CM | POA: Diagnosis not present

## 2019-10-27 DIAGNOSIS — Z938 Other artificial opening status: Secondary | ICD-10-CM | POA: Diagnosis not present

## 2019-10-27 DIAGNOSIS — E890 Postprocedural hypothyroidism: Secondary | ICD-10-CM | POA: Diagnosis not present

## 2019-10-27 DIAGNOSIS — K831 Obstruction of bile duct: Secondary | ICD-10-CM | POA: Diagnosis not present

## 2019-10-27 DIAGNOSIS — E119 Type 2 diabetes mellitus without complications: Secondary | ICD-10-CM | POA: Insufficient documentation

## 2019-10-27 DIAGNOSIS — Z95828 Presence of other vascular implants and grafts: Secondary | ICD-10-CM

## 2019-10-27 DIAGNOSIS — R110 Nausea: Secondary | ICD-10-CM | POA: Diagnosis not present

## 2019-10-27 DIAGNOSIS — C801 Malignant (primary) neoplasm, unspecified: Secondary | ICD-10-CM

## 2019-10-27 LAB — CBC WITH DIFFERENTIAL (CANCER CENTER ONLY)
Abs Immature Granulocytes: 0.03 10*3/uL (ref 0.00–0.07)
Basophils Absolute: 0 10*3/uL (ref 0.0–0.1)
Basophils Relative: 1 %
Eosinophils Absolute: 0 10*3/uL (ref 0.0–0.5)
Eosinophils Relative: 1 %
HCT: 34.3 % — ABNORMAL LOW (ref 36.0–46.0)
Hemoglobin: 10.8 g/dL — ABNORMAL LOW (ref 12.0–15.0)
Immature Granulocytes: 1 %
Lymphocytes Relative: 31 %
Lymphs Abs: 1.6 10*3/uL (ref 0.7–4.0)
MCH: 30.9 pg (ref 26.0–34.0)
MCHC: 31.5 g/dL (ref 30.0–36.0)
MCV: 98 fL (ref 80.0–100.0)
Monocytes Absolute: 0.4 10*3/uL (ref 0.1–1.0)
Monocytes Relative: 8 %
Neutro Abs: 3.1 10*3/uL (ref 1.7–7.7)
Neutrophils Relative %: 58 %
Platelet Count: 184 10*3/uL (ref 150–400)
RBC: 3.5 MIL/uL — ABNORMAL LOW (ref 3.87–5.11)
RDW: 15.8 % — ABNORMAL HIGH (ref 11.5–15.5)
WBC Count: 5.2 10*3/uL (ref 4.0–10.5)
nRBC: 0 % (ref 0.0–0.2)

## 2019-10-27 LAB — CMP (CANCER CENTER ONLY)
ALT: 28 U/L (ref 0–44)
AST: 34 U/L (ref 15–41)
Albumin: 3.4 g/dL — ABNORMAL LOW (ref 3.5–5.0)
Alkaline Phosphatase: 106 U/L (ref 38–126)
Anion gap: 8 (ref 5–15)
BUN: 28 mg/dL — ABNORMAL HIGH (ref 8–23)
CO2: 28 mmol/L (ref 22–32)
Calcium: 8.3 mg/dL — ABNORMAL LOW (ref 8.9–10.3)
Chloride: 107 mmol/L (ref 98–111)
Creatinine: 1.22 mg/dL — ABNORMAL HIGH (ref 0.44–1.00)
GFR, Est AFR Am: 48 mL/min — ABNORMAL LOW (ref >60)
GFR, Estimated: 42 mL/min — ABNORMAL LOW (ref >60)
Glucose, Bld: 67 mg/dL — ABNORMAL LOW (ref 70–99)
Potassium: 3.6 mmol/L (ref 3.5–5.1)
Sodium: 143 mmol/L (ref 135–145)
Total Bilirubin: 0.6 mg/dL (ref 0.3–1.2)
Total Protein: 6.8 g/dL (ref 6.5–8.1)

## 2019-10-27 MED ORDER — HEPARIN SOD (PORK) LOCK FLUSH 100 UNIT/ML IV SOLN
500.0000 [IU] | Freq: Once | INTRAVENOUS | Status: AC | PRN
  Administered 2019-10-27: 500 [IU]
  Filled 2019-10-27: qty 5

## 2019-10-27 MED ORDER — SODIUM CHLORIDE 0.9% FLUSH
10.0000 mL | INTRAVENOUS | Status: DC | PRN
  Administered 2019-10-27: 10 mL
  Filled 2019-10-27: qty 10

## 2019-10-27 MED ORDER — PROCHLORPERAZINE MALEATE 10 MG PO TABS
10.0000 mg | ORAL_TABLET | Freq: Once | ORAL | Status: AC
  Administered 2019-10-27: 10 mg via ORAL

## 2019-10-27 MED ORDER — SODIUM CHLORIDE 0.9 % IV SOLN
800.0000 mg/m2 | Freq: Once | INTRAVENOUS | Status: AC
  Administered 2019-10-27: 1558 mg via INTRAVENOUS
  Filled 2019-10-27: qty 40.98

## 2019-10-27 MED ORDER — SODIUM CHLORIDE 0.9% FLUSH
10.0000 mL | Freq: Once | INTRAVENOUS | Status: AC
  Administered 2019-10-27: 10 mL
  Filled 2019-10-27: qty 10

## 2019-10-27 MED ORDER — SODIUM CHLORIDE 0.9 % IV SOLN
Freq: Once | INTRAVENOUS | Status: AC
  Filled 2019-10-27: qty 250

## 2019-10-27 MED ORDER — PACLITAXEL PROTEIN-BOUND CHEMO INJECTION 100 MG
100.0000 mg/m2 | Freq: Once | INTRAVENOUS | Status: AC
  Administered 2019-10-27: 200 mg via INTRAVENOUS
  Filled 2019-10-27: qty 40

## 2019-10-27 MED ORDER — PROCHLORPERAZINE MALEATE 10 MG PO TABS
ORAL_TABLET | ORAL | Status: AC
  Filled 2019-10-27: qty 1

## 2019-10-27 NOTE — Patient Instructions (Signed)
[facility] Discharge Instructions for Patients Receiving Chemotherapy  Today you received the following chemotherapy agents: Paclitaxel protein-bound (Abraxane) and Gemcitabine (Gemzar)  To help prevent nausea and vomiting after your treatment, we encourage you to take your nausea medication as directed.   If you develop nausea and vomiting that is not controlled by your nausea medication, call the clinic.   BELOW ARE SYMPTOMS THAT SHOULD BE REPORTED IMMEDIATELY:  *FEVER GREATER THAN 100.5 F  *CHILLS WITH OR WITHOUT FEVER  NAUSEA AND VOMITING THAT IS NOT CONTROLLED WITH YOUR NAUSEA MEDICATION  *UNUSUAL SHORTNESS OF BREATH  *UNUSUAL BRUISING OR BLEEDING  TENDERNESS IN MOUTH AND THROAT WITH OR WITHOUT PRESENCE OF ULCERS  *URINARY PROBLEMS  *BOWEL PROBLEMS  UNUSUAL RASH Items with * indicate a potential emergency and should be followed up as soon as possible.  Feel free to call the clinic should you have any questions or concerns. The clinic phone number is [phone].  Please show the CHEMO ALERT CARD at check-in to the Emergency Department and triage nurse.  Coronavirus (COVID-19) Are you at risk?  Are you at risk for the Coronavirus (COVID-19)?  To be considered HIGH RISK for Coronavirus (COVID-19), you have to meet the following criteria:  . Traveled to [place], [place], [place], [place] or [place]; or in the [place] to [place], [place], [place], or [place]; and have fever, cough, and shortness of breath within the last 2 weeks of travel OR . Been in close contact with a person diagnosed with COVID-19 within the last 2 weeks and have fever, cough, and shortness of breath . IF YOU DO NOT MEET THESE CRITERIA, YOU ARE CONSIDERED LOW RISK FOR COVID-19.  What to do if you are HIGH RISK for COVID-19?  . If you are having a medical emergency, call 911. . Seek medical care right away. Before you go to a doctor's office, urgent care or emergency  department, call ahead and tell them about your recent travel, contact with someone diagnosed with COVID-19, and your symptoms. You should receive instructions from your physician's office regarding next steps of care.  . When you arrive at healthcare provider, tell the healthcare staff immediately you have returned from visiting [place], [place], [place], [place] or [place]; or traveled in the [place] to [place], [place], [place], or [place]; in the last two weeks or you have been in close contact with a person diagnosed with COVID-19 in the last 2 weeks.   . Tell the health care staff about your symptoms: fever, cough and shortness of breath. . After you have been seen by a medical provider, you will be either: o Tested for (COVID-19) and discharged home on quarantine except to seek medical care if symptoms worsen, and asked to  - Stay home and avoid contact with others until you get your results (4-5 days)  - Avoid travel on public transportation if possible (such as bus, train, or airplane) or o Sent to the Emergency Department by EMS for evaluation, COVID-19 testing, and possible admission depending on your condition and test results.  What to do if you are LOW RISK for COVID-19?  Reduce your risk of any infection by using the same precautions used for avoiding the common cold or flu:  . Wash your hands often with soap and warm water for at least 20 seconds.  If soap and water are not readily available, use an alcohol-based hand sanitizer with at least 60% alcohol.  .   If coughing or sneezing, cover your mouth and nose by coughing or sneezing into the elbow areas of your shirt or coat, into a tissue or into your sleeve (not your hands). . Avoid shaking hands with others and consider head nods or verbal greetings only. . Avoid touching your eyes, nose, or mouth with unwashed hands.  . Avoid close contact with people who are sick. . Avoid places or events with large numbers of people  in one location, like concerts or sporting events. . Carefully consider travel plans you have or are making. . If you are planning any travel outside or inside the [place], visit the CDC's Travelers' Health webpage for the latest health notices. . If you have some symptoms but not all symptoms, continue to monitor at home and seek medical attention if your symptoms worsen. . If you are having a medical emergency, call 911.   ADDITIONAL HEALTHCARE OPTIONS FOR PATIENTS  [facility] / e-Visit: [email or web address]         [facility]: [phone]  [facility]: [phone]                   [facility]: [phone]   

## 2019-10-27 NOTE — Telephone Encounter (Signed)
-----   Message from Owens Shark, NP sent at 10/27/2019  3:40 PM EST ----- Please let her know creatinine is up slightly, hold losartan, lasix. She will need a repeat bmet few days prior to CT. I will enter that order and send LOS. Thanks

## 2019-10-27 NOTE — Progress Notes (Signed)
Contacted by telephone for nutrition follow up. She was not available but left a message with name and phone number for call back.

## 2019-10-27 NOTE — Progress Notes (Signed)
[facility] OFFICE PROGRESS NOTE   Diagnosis: Ampullary carcinoma  INTERVAL HISTORY:   Ms. Bentsen returns as scheduled.  She completed cycle 4 gemcitabine/Abraxane 10/13/2019.  She denies nausea/vomiting.  No mouth sores.  No diarrhea.  No numbness or tingling in the hands or feet.  Appetite is stable.  Pain overall is better though she has noticed increased pain at the left side over the past few days.  Objective:  Vital signs in last 24 hours:  Blood pressure (!) 149/71, pulse (!) 56, temperature 98.5 F (36.9 C), temperature source Temporal, resp. rate 17, height 5\' 4"  (1.626 m), weight 187 lb 9.6 oz (85.1 kg), SpO2 100 %.    HEENT: No thrush or ulcers. GI: Abdomen soft and nontender.  No hepatomegaly. Vascular: Trace edema at the lower legs bilaterally. Neuro: Alert and oriented. Port-A-Cath without erythema.  Lab Results:  Lab Results  Component Value Date   WBC 5.2 10/27/2019   HGB 10.8 (L) 10/27/2019   HCT 34.3 (L) 10/27/2019   MCV 98.0 10/27/2019   PLT 184 10/27/2019   NEUTROABS 3.1 10/27/2019    Imaging:  No results found.  Medications: I have reviewed the patient's current medications.  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 CT abdomen/pelvis 10/23/2016-ampullary mass, single mildly enlarged porta hepatis lymph node, no evidence of distant metastatic disease ? Status post pancreaticoduodenectomy 01/12/2017; pT3pN2 ? CT abdomen/pelvis 03/30/2018-ablation defect within the upper pole of the right kidney. No evidence of recurrent renal mass. New mild left periaortic retroperitoneal lymphadenopathy. ? CT abdomen/pelvis 06/30/2018-6 mm right middle lobe nodule-slowly growing over multiple CTs, stable ablation defect in the right kidney, 1.9 cm left periaortic node increased from 1.2 cm, stable 1.5 cm porta hepatis node ? CT abdomen/pelvis  09/17/2018-surgical changes related to Whipple procedure. Stable upper abdominal/retroperitoneal lymphadenopathy. 5 mm right middle lobe nodule, stable from most recent CT but mildly progressive from priors. Postprocedural changes related to renal ablation in the medial right upper kidney. ? CTs 03/24/2019-a few scattered pulmonary nodules appears stable; liver has a slightly shrunken appearance and nodular appearance compatible with mild cirrhosis. No suspicious cystic or solid hepatic lesions. Upper abdominal and retroperitoneal lymphadenopathy appears similar compared to the prior study. No other new lymphadenopathy noted elsewhere in the abdomen or pelvis. ? PET scan 07/03/2019-hypermetabolic lymph node adjacent porta hepatis. Newfocal hypermetabolic lesion in the liver. Hypermetabolic activity in the left suprarenal location favored metastatic adenopathy in the retroperitoneum. Diffuse hypermetabolic activity within the stomach favor gastritis. ? Cycle 1 gemcitabine/Abraxane 08/31/2019 ? Cycle 2 gemcitabine/Abraxane 09/14/2019 ? Cycle 3 gemcitabine/Abraxane 09/29/2019 ? Cycle 4 gemcitabine/Abraxane 10/13/2019 ? Cycle 5 gemcitabine/Abraxane 10/27/2019 2. Biliary obstruction secondary to #1, status post placement of a metal, bile duct stent on 10/23/2016  3. 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 Renal cell carcinoma-status post ablation of a right renal mass 07/15/2016, biopsy confirmed papillary renal cell carcinoma, Fuhrman grade 3  5. CVA in 2016  6. History of thyroid cancer-status post thyroidectomy and radioactive iodine in 2005  7. Diabetes  8.Hypertension  9.Left lower extremity deep vein thrombosis June 2019, IVC filter placed July 2018 after she was diagnosed with a rectus hematoma while on Lovenox  10. Hospitalized 04/12/2019 through 04/14/2019 with traumatic hematoma right lower leg, followed at the wound  clinic  11.Port-A-Cath placement interventional radiology 08/29/2019    Disposition: Ms. Gwathney appears stable.  She has completed 4 cycles  of gemcitabine/Abraxane.  Most recent CA 19-9 tumor marker was further improved.  She is tolerating chemotherapy well.  Plan to proceed with cycle 5 today as scheduled.  She will undergo restaging CTs prior to her next visit.  She will return for lab, follow-up, cycle 6 gemcitabine/Abraxane in 2 weeks.  She will contact the office in the interim with any problems.  Plan reviewed with Dr. Benay Spice.  [name] ANP/GNP-BC   10/27/2019  10:29 AM

## 2019-10-27 NOTE — Telephone Encounter (Signed)
Notified of elevated creatinine and to hold lasix and losartan until she is informed OK to resume. Scheduler will call her for repeat labs few days prior to her CT scan to see improved. She understands and agrees.

## 2019-10-28 LAB — CANCER ANTIGEN 19-9: CA 19-9: 5203 U/mL — ABNORMAL HIGH (ref 0–35)

## 2019-10-31 ENCOUNTER — Telehealth: Payer: Self-pay | Admitting: Nurse Practitioner

## 2019-10-31 NOTE — Telephone Encounter (Signed)
Scheduled per los. Called and spoke with patient. Confirmed appt 

## 2019-10-31 NOTE — Telephone Encounter (Signed)
Scheduled appt per 3/16 sch message - pt aware of appt date and time

## 2019-11-03 ENCOUNTER — Inpatient Hospital Stay: Payer: Medicare Other

## 2019-11-03 ENCOUNTER — Other Ambulatory Visit: Payer: Self-pay

## 2019-11-03 DIAGNOSIS — Z5111 Encounter for antineoplastic chemotherapy: Secondary | ICD-10-CM | POA: Diagnosis not present

## 2019-11-03 DIAGNOSIS — C241 Malignant neoplasm of ampulla of Vater: Secondary | ICD-10-CM

## 2019-11-03 DIAGNOSIS — C801 Malignant (primary) neoplasm, unspecified: Secondary | ICD-10-CM

## 2019-11-03 DIAGNOSIS — Z95828 Presence of other vascular implants and grafts: Secondary | ICD-10-CM

## 2019-11-03 LAB — BASIC METABOLIC PANEL - CANCER CENTER ONLY
Anion gap: 7 (ref 5–15)
BUN: 20 mg/dL (ref 8–23)
CO2: 25 mmol/L (ref 22–32)
Calcium: 8.1 mg/dL — ABNORMAL LOW (ref 8.9–10.3)
Chloride: 109 mmol/L (ref 98–111)
Creatinine: 1 mg/dL (ref 0.44–1.00)
GFR, Est AFR Am: >60 mL/min (ref >60)
GFR, Estimated: 53 mL/min — ABNORMAL LOW (ref >60)
Glucose, Bld: 114 mg/dL — ABNORMAL HIGH (ref 70–99)
Potassium: 4.1 mmol/L (ref 3.5–5.1)
Sodium: 141 mmol/L (ref 135–145)

## 2019-11-03 MED ORDER — HEPARIN SOD (PORK) LOCK FLUSH 100 UNIT/ML IV SOLN
500.0000 [IU] | Freq: Once | INTRAVENOUS | Status: AC
  Administered 2019-11-03: 500 [IU]
  Filled 2019-11-03: qty 5

## 2019-11-03 MED ORDER — SODIUM CHLORIDE 0.9% FLUSH
10.0000 mL | Freq: Once | INTRAVENOUS | Status: AC
  Administered 2019-11-03: 10 mL
  Filled 2019-11-03: qty 10

## 2019-11-03 NOTE — Progress Notes (Signed)
Pharmacist Chemotherapy Monitoring - Follow Up Assessment    I verify that I have reviewed each item in the below checklist:  . Regimen for the patient is scheduled for the appropriate day and plan matches scheduled date. Marland Kitchen Appropriate non-routine labs are ordered dependent on drug ordered. . If applicable, additional medications reviewed and ordered per protocol based on lifetime cumulative doses and/or treatment regimen.   Plan for follow-up and/or issues identified: No . I-vent associated with next due treatment: No . MD and/or nursing notified: No  Philomena Course 11/03/2019 8:06 AM

## 2019-11-04 ENCOUNTER — Other Ambulatory Visit: Payer: Self-pay | Admitting: Oncology

## 2019-11-09 ENCOUNTER — Other Ambulatory Visit: Payer: Self-pay

## 2019-11-09 ENCOUNTER — Inpatient Hospital Stay: Payer: Medicare Other

## 2019-11-09 ENCOUNTER — Inpatient Hospital Stay: Payer: Medicare Other | Admitting: Nutrition

## 2019-11-09 ENCOUNTER — Inpatient Hospital Stay (HOSPITAL_BASED_OUTPATIENT_CLINIC_OR_DEPARTMENT_OTHER): Payer: Medicare Other | Admitting: Nurse Practitioner

## 2019-11-09 ENCOUNTER — Encounter: Payer: Self-pay | Admitting: Nurse Practitioner

## 2019-11-09 VITALS — BP 147/65 | HR 57 | Temp 98.0°F | Resp 17 | Ht 64.0 in | Wt 187.0 lb

## 2019-11-09 DIAGNOSIS — C241 Malignant neoplasm of ampulla of Vater: Secondary | ICD-10-CM

## 2019-11-09 DIAGNOSIS — C801 Malignant (primary) neoplasm, unspecified: Secondary | ICD-10-CM

## 2019-11-09 DIAGNOSIS — Z95828 Presence of other vascular implants and grafts: Secondary | ICD-10-CM

## 2019-11-09 DIAGNOSIS — Z5111 Encounter for antineoplastic chemotherapy: Secondary | ICD-10-CM | POA: Diagnosis not present

## 2019-11-09 LAB — CBC WITH DIFFERENTIAL (CANCER CENTER ONLY)
Abs Immature Granulocytes: 0.05 10*3/uL (ref 0.00–0.07)
Basophils Absolute: 0 10*3/uL (ref 0.0–0.1)
Basophils Relative: 0 %
Eosinophils Absolute: 0 10*3/uL (ref 0.0–0.5)
Eosinophils Relative: 1 %
HCT: 33.7 % — ABNORMAL LOW (ref 36.0–46.0)
Hemoglobin: 10.9 g/dL — ABNORMAL LOW (ref 12.0–15.0)
Immature Granulocytes: 1 %
Lymphocytes Relative: 27 %
Lymphs Abs: 1.7 10*3/uL (ref 0.7–4.0)
MCH: 31.8 pg (ref 26.0–34.0)
MCHC: 32.3 g/dL (ref 30.0–36.0)
MCV: 98.3 fL (ref 80.0–100.0)
Monocytes Absolute: 0.5 10*3/uL (ref 0.1–1.0)
Monocytes Relative: 8 %
Neutro Abs: 3.9 10*3/uL (ref 1.7–7.7)
Neutrophils Relative %: 63 %
Platelet Count: 224 10*3/uL (ref 150–400)
RBC: 3.43 MIL/uL — ABNORMAL LOW (ref 3.87–5.11)
RDW: 15.6 % — ABNORMAL HIGH (ref 11.5–15.5)
WBC Count: 6.2 10*3/uL (ref 4.0–10.5)
nRBC: 0 % (ref 0.0–0.2)

## 2019-11-09 LAB — CMP (CANCER CENTER ONLY)
ALT: 24 U/L (ref 0–44)
AST: 34 U/L (ref 15–41)
Albumin: 3.3 g/dL — ABNORMAL LOW (ref 3.5–5.0)
Alkaline Phosphatase: 96 U/L (ref 38–126)
Anion gap: 8 (ref 5–15)
BUN: 24 mg/dL — ABNORMAL HIGH (ref 8–23)
CO2: 25 mmol/L (ref 22–32)
Calcium: 8.3 mg/dL — ABNORMAL LOW (ref 8.9–10.3)
Chloride: 108 mmol/L (ref 98–111)
Creatinine: 0.99 mg/dL (ref 0.44–1.00)
GFR, Est AFR Am: >60 mL/min (ref >60)
GFR, Estimated: 53 mL/min — ABNORMAL LOW (ref >60)
Glucose, Bld: 160 mg/dL — ABNORMAL HIGH (ref 70–99)
Potassium: 3.9 mmol/L (ref 3.5–5.1)
Sodium: 141 mmol/L (ref 135–145)
Total Bilirubin: 0.7 mg/dL (ref 0.3–1.2)
Total Protein: 6.7 g/dL (ref 6.5–8.1)

## 2019-11-09 MED ORDER — PACLITAXEL PROTEIN-BOUND CHEMO INJECTION 100 MG
100.0000 mg/m2 | Freq: Once | INTRAVENOUS | Status: AC
  Administered 2019-11-09: 200 mg via INTRAVENOUS
  Filled 2019-11-09: qty 40

## 2019-11-09 MED ORDER — HEPARIN SOD (PORK) LOCK FLUSH 100 UNIT/ML IV SOLN
500.0000 [IU] | Freq: Once | INTRAVENOUS | Status: AC | PRN
  Administered 2019-11-09: 500 [IU]
  Filled 2019-11-09: qty 5

## 2019-11-09 MED ORDER — PROCHLORPERAZINE MALEATE 10 MG PO TABS
ORAL_TABLET | ORAL | Status: AC
  Filled 2019-11-09: qty 1

## 2019-11-09 MED ORDER — SODIUM CHLORIDE 0.9 % IV SOLN
800.0000 mg/m2 | Freq: Once | INTRAVENOUS | Status: AC
  Administered 2019-11-09: 1558 mg via INTRAVENOUS
  Filled 2019-11-09: qty 40.98

## 2019-11-09 MED ORDER — SODIUM CHLORIDE 0.9% FLUSH
10.0000 mL | Freq: Once | INTRAVENOUS | Status: AC
  Administered 2019-11-09: 10 mL
  Filled 2019-11-09: qty 10

## 2019-11-09 MED ORDER — SODIUM CHLORIDE 0.9% FLUSH
10.0000 mL | INTRAVENOUS | Status: DC | PRN
  Administered 2019-11-09: 10 mL
  Filled 2019-11-09: qty 10

## 2019-11-09 MED ORDER — PROCHLORPERAZINE MALEATE 10 MG PO TABS
10.0000 mg | ORAL_TABLET | Freq: Once | ORAL | Status: AC
  Administered 2019-11-09: 10 mg via ORAL

## 2019-11-09 MED ORDER — SODIUM CHLORIDE 0.9 % IV SOLN
Freq: Once | INTRAVENOUS | Status: AC
  Filled 2019-11-09: qty 250

## 2019-11-09 NOTE — Patient Instructions (Signed)
[facility] Discharge Instructions for Patients Receiving Chemotherapy  Today you received the following chemotherapy agents: Paclitaxel protein-bound (Abraxane) and Gemcitabine (Gemzar)  To help prevent nausea and vomiting after your treatment, we encourage you to take your nausea medication as directed.   If you develop nausea and vomiting that is not controlled by your nausea medication, call the clinic.   BELOW ARE SYMPTOMS THAT SHOULD BE REPORTED IMMEDIATELY:  *FEVER GREATER THAN 100.5 F  *CHILLS WITH OR WITHOUT FEVER  NAUSEA AND VOMITING THAT IS NOT CONTROLLED WITH YOUR NAUSEA MEDICATION  *UNUSUAL SHORTNESS OF BREATH  *UNUSUAL BRUISING OR BLEEDING  TENDERNESS IN MOUTH AND THROAT WITH OR WITHOUT PRESENCE OF ULCERS  *URINARY PROBLEMS  *BOWEL PROBLEMS  UNUSUAL RASH Items with * indicate a potential emergency and should be followed up as soon as possible.  Feel free to call the clinic should you have any questions or concerns. The clinic phone number is [phone].  Please show the [place] at check-in to the Emergency Department and triage nurse.

## 2019-11-09 NOTE — Progress Notes (Signed)
Nutrition follow up completed with patient during infusion. Weight is stable at 187 pounds. Patient is eating 3 meals with snacks. Denies nutrition impact symptoms. No follow up scheduled. Patient has my contact number for questions.

## 2019-11-09 NOTE — Progress Notes (Signed)
[facility] OFFICE PROGRESS NOTE   Diagnosis: Ampullary carcinoma  INTERVAL HISTORY:   Kaylee Mullins returns as scheduled.  She completed cycle 5 gemcitabine/Abraxane 10/27/2019.  She had a single episode of nausea.  No vomiting.  No mouth sores.  No diarrhea.  No fever or rash following treatment.  No numbness or tingling in the hands or feet.  Abdominal pain continues to be improved.  Objective:  Vital signs in last 24 hours:  Blood pressure (!) 147/65, pulse (!) 57, temperature 98 F (36.7 C), temperature source Temporal, resp. rate 17, height 5\' 4"  (1.626 m), weight 187 lb (84.8 kg), SpO2 100 %.    HEENT: No thrush or ulcers. GI: Abdomen soft and nontender.  No hepatomegaly.  No mass. Vascular: Trace edema lower legs bilaterally. Neuro: Vibratory sense mildly decreased over the fingertips per tuning fork exam. Skin: No rash. Port-A-Cath without erythema.   Lab Results:  Lab Results  Component Value Date   WBC 6.2 11/09/2019   HGB 10.9 (L) 11/09/2019   HCT 33.7 (L) 11/09/2019   MCV 98.3 11/09/2019   PLT 224 11/09/2019   NEUTROABS 3.9 11/09/2019    Imaging:  No results found.  Medications: I have reviewed the patient's current medications.  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 CT abdomen/pelvis 10/23/2016-ampullary mass, single mildly enlarged porta hepatis lymph node, no evidence of distant metastatic disease ? Status post pancreaticoduodenectomy 01/12/2017; pT3pN2 ? CT abdomen/pelvis 03/30/2018-ablation defect within the upper pole of the right kidney. No evidence of recurrent renal mass. New mild left periaortic retroperitoneal lymphadenopathy. ? CT abdomen/pelvis 06/30/2018-6 mm right middle lobe nodule-slowly growing over multiple CTs, stable ablation defect in the right kidney, 1.9 cm left periaortic node increased from 1.2 cm, stable 1.5  cm porta hepatis node ? CT abdomen/pelvis 09/17/2018-surgical changes related to Whipple procedure. Stable upper abdominal/retroperitoneal lymphadenopathy. 5 mm right middle lobe nodule, stable from most recent CT but mildly progressive from priors. Postprocedural changes related to renal ablation in the medial right upper kidney. ? CTs 03/24/2019-a few scattered pulmonary nodules appears stable; liver has a slightly shrunken appearance and nodular appearance compatible with mild cirrhosis. No suspicious cystic or solid hepatic lesions. Upper abdominal and retroperitoneal lymphadenopathy appears similar compared to the prior study. No other new lymphadenopathy noted elsewhere in the abdomen or pelvis. ? PET scan 07/03/2019-hypermetabolic lymph node adjacent porta hepatis. Newfocal hypermetabolic lesion in the liver. Hypermetabolic activity in the left suprarenal location favored metastatic adenopathy in the retroperitoneum. Diffuse hypermetabolic activity within the stomach favor gastritis. ? Cycle 1 gemcitabine/Abraxane 08/31/2019 ? Cycle 2 gemcitabine/Abraxane 09/14/2019 ? Cycle 3 gemcitabine/Abraxane 09/29/2019 ? Cycle 4 gemcitabine/Abraxane 10/13/2019 ? Cycle 5 gemcitabine/Abraxane 10/27/2019 ? Cycle 6 gemcitabine/Abraxane 11/09/2019 2. Biliary obstruction secondary to #1, status post placement of a metal, bile duct stent on 10/23/2016  3. 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 Renal cell carcinoma-status post ablation of a right renal mass 07/15/2016, biopsy confirmed papillary renal cell carcinoma, Fuhrman grade 3  5. CVA in 2016  6. History of thyroid cancer-status post thyroidectomy and radioactive iodine in 2005  7. Diabetes  8.Hypertension  9.Left lower extremity deep vein thrombosis June 2019, IVC filter placed July 2018 after she was diagnosed with a rectus hematoma while on Lovenox  10. Hospitalized 04/12/2019 through  04/14/2019 with traumatic hematoma right lower leg, followed at the wound clinic  11.Port-A-Cath placement interventional radiology 08/29/2019  Disposition: Kaylee Mullins appears stable.  She has completed 5 cycles of gemcitabine/Abraxane.  She was referred for restaging CT scans after her last visit.  The scans are scheduled for early next week.  We decided to proceed with cycle 6 gemcitabine/Abraxane today as scheduled, scans 11/13/2019.  We reviewed the CBC from today.  Counts adequate to proceed with treatment.  She will return for lab, follow-up, gemcitabine/Abraxane in 2 weeks.    [name] ANP/GNP-BC   11/09/2019  1:02 PM

## 2019-11-09 NOTE — Patient Instructions (Signed)

## 2019-11-10 ENCOUNTER — Other Ambulatory Visit: Payer: Medicare Other

## 2019-11-10 ENCOUNTER — Ambulatory Visit: Payer: Medicare Other

## 2019-11-10 ENCOUNTER — Ambulatory Visit: Payer: Medicare Other | Admitting: Nurse Practitioner

## 2019-11-13 ENCOUNTER — Ambulatory Visit (HOSPITAL_COMMUNITY)
Admission: RE | Admit: 2019-11-13 | Discharge: 2019-11-13 | Disposition: A | Discharge status: Home / Self Care | Payer: Medicare Other | Source: Ambulatory Visit | Attending: Nurse Practitioner | Admitting: Nurse Practitioner

## 2019-11-13 ENCOUNTER — Other Ambulatory Visit: Payer: Self-pay

## 2019-11-13 ENCOUNTER — Telehealth: Payer: Self-pay | Admitting: Oncology

## 2019-11-13 ENCOUNTER — Encounter (HOSPITAL_COMMUNITY): Payer: Self-pay

## 2019-11-13 DIAGNOSIS — C241 Malignant neoplasm of ampulla of Vater: Secondary | ICD-10-CM | POA: Insufficient documentation

## 2019-11-13 LAB — CANCER ANTIGEN 19-9: CA 19-9: 4280 U/mL — ABNORMAL HIGH (ref 0–35)

## 2019-11-13 MED ORDER — IOHEXOL 300 MG/ML  SOLN
100.0000 mL | Freq: Once | INTRAMUSCULAR | Status: AC | PRN
  Administered 2019-11-13: 100 mL via INTRAVENOUS

## 2019-11-13 MED ORDER — HEPARIN SOD (PORK) LOCK FLUSH 100 UNIT/ML IV SOLN
INTRAVENOUS | Status: AC
  Filled 2019-11-13: qty 5

## 2019-11-13 MED ORDER — SODIUM CHLORIDE (PF) 0.9 % IJ SOLN
INTRAMUSCULAR | Status: AC
  Filled 2019-11-13: qty 50

## 2019-11-13 MED ORDER — HEPARIN SOD (PORK) LOCK FLUSH 100 UNIT/ML IV SOLN
500.0000 [IU] | Freq: Once | INTRAVENOUS | Status: AC
  Administered 2019-11-13: 500 [IU] via INTRAVENOUS

## 2019-11-13 NOTE — Telephone Encounter (Signed)
Scheduled per los. Called and left msg. Mailed printout  °

## 2019-11-18 ENCOUNTER — Other Ambulatory Visit: Payer: Self-pay | Admitting: Oncology

## 2019-11-20 NOTE — Progress Notes (Signed)
Pharmacist Chemotherapy Monitoring - Follow Up Assessment    I verify that I have reviewed each item in the below checklist:  . Regimen for the patient is scheduled for the appropriate day and plan matches scheduled date. Marland Kitchen Appropriate non-routine labs are ordered dependent on drug ordered. . If applicable, additional medications reviewed and ordered per protocol based on lifetime cumulative doses and/or treatment regimen.   Plan for follow-up and/or issues identified: No . I-vent associated with next due treatment: No . MD and/or nursing notified: No  Philomena Course 11/20/2019 10:01 AM

## 2019-11-24 ENCOUNTER — Other Ambulatory Visit: Payer: Self-pay | Admitting: *Deleted

## 2019-11-24 ENCOUNTER — Other Ambulatory Visit: Payer: Self-pay

## 2019-11-24 ENCOUNTER — Telehealth: Payer: Self-pay | Admitting: *Deleted

## 2019-11-24 ENCOUNTER — Inpatient Hospital Stay: Payer: Medicare Other

## 2019-11-24 ENCOUNTER — Inpatient Hospital Stay: Payer: Medicare Other | Attending: Internal Medicine | Admitting: Oncology

## 2019-11-24 VITALS — BP 161/83 | HR 71 | Temp 98.1°F | Resp 17 | Ht 64.0 in | Wt 184.4 lb

## 2019-11-24 DIAGNOSIS — C241 Malignant neoplasm of ampulla of Vater: Secondary | ICD-10-CM

## 2019-11-24 DIAGNOSIS — Z86718 Personal history of other venous thrombosis and embolism: Secondary | ICD-10-CM | POA: Insufficient documentation

## 2019-11-24 DIAGNOSIS — C779 Secondary and unspecified malignant neoplasm of lymph node, unspecified: Secondary | ICD-10-CM | POA: Diagnosis not present

## 2019-11-24 DIAGNOSIS — M545 Low back pain: Secondary | ICD-10-CM | POA: Insufficient documentation

## 2019-11-24 DIAGNOSIS — R309 Painful micturition, unspecified: Secondary | ICD-10-CM | POA: Insufficient documentation

## 2019-11-24 DIAGNOSIS — R300 Dysuria: Secondary | ICD-10-CM | POA: Diagnosis not present

## 2019-11-24 DIAGNOSIS — Z85528 Personal history of other malignant neoplasm of kidney: Secondary | ICD-10-CM | POA: Diagnosis not present

## 2019-11-24 DIAGNOSIS — E890 Postprocedural hypothyroidism: Secondary | ICD-10-CM | POA: Diagnosis not present

## 2019-11-24 DIAGNOSIS — Z5111 Encounter for antineoplastic chemotherapy: Secondary | ICD-10-CM | POA: Diagnosis not present

## 2019-11-24 DIAGNOSIS — N3000 Acute cystitis without hematuria: Secondary | ICD-10-CM

## 2019-11-24 DIAGNOSIS — E119 Type 2 diabetes mellitus without complications: Secondary | ICD-10-CM | POA: Diagnosis not present

## 2019-11-24 DIAGNOSIS — Z85850 Personal history of malignant neoplasm of thyroid: Secondary | ICD-10-CM | POA: Diagnosis not present

## 2019-11-24 DIAGNOSIS — Z7901 Long term (current) use of anticoagulants: Secondary | ICD-10-CM | POA: Diagnosis not present

## 2019-11-24 DIAGNOSIS — Z95828 Presence of other vascular implants and grafts: Secondary | ICD-10-CM

## 2019-11-24 DIAGNOSIS — C801 Malignant (primary) neoplasm, unspecified: Secondary | ICD-10-CM

## 2019-11-24 DIAGNOSIS — I10 Essential (primary) hypertension: Secondary | ICD-10-CM | POA: Diagnosis not present

## 2019-11-24 LAB — CMP (CANCER CENTER ONLY)
ALT: 26 U/L (ref 0–44)
AST: 36 U/L (ref 15–41)
Albumin: 3.5 g/dL (ref 3.5–5.0)
Alkaline Phosphatase: 98 U/L (ref 38–126)
Anion gap: 7 (ref 5–15)
BUN: 21 mg/dL (ref 8–23)
CO2: 30 mmol/L (ref 22–32)
Calcium: 8.7 mg/dL — ABNORMAL LOW (ref 8.9–10.3)
Chloride: 105 mmol/L (ref 98–111)
Creatinine: 0.95 mg/dL (ref 0.44–1.00)
GFR, Est AFR Am: >60 mL/min (ref >60)
GFR, Estimated: 56 mL/min — ABNORMAL LOW (ref >60)
Glucose, Bld: 64 mg/dL — ABNORMAL LOW (ref 70–99)
Potassium: 3.5 mmol/L (ref 3.5–5.1)
Sodium: 142 mmol/L (ref 135–145)
Total Bilirubin: 0.8 mg/dL (ref 0.3–1.2)
Total Protein: 7.3 g/dL (ref 6.5–8.1)

## 2019-11-24 LAB — CBC WITH DIFFERENTIAL (CANCER CENTER ONLY)
Abs Immature Granulocytes: 0.03 10*3/uL (ref 0.00–0.07)
Basophils Absolute: 0 10*3/uL (ref 0.0–0.1)
Basophils Relative: 0 %
Eosinophils Absolute: 0 10*3/uL (ref 0.0–0.5)
Eosinophils Relative: 1 %
HCT: 35.1 % — ABNORMAL LOW (ref 36.0–46.0)
Hemoglobin: 11.2 g/dL — ABNORMAL LOW (ref 12.0–15.0)
Immature Granulocytes: 1 %
Lymphocytes Relative: 29 %
Lymphs Abs: 1.4 10*3/uL (ref 0.7–4.0)
MCH: 31.7 pg (ref 26.0–34.0)
MCHC: 31.9 g/dL (ref 30.0–36.0)
MCV: 99.4 fL (ref 80.0–100.0)
Monocytes Absolute: 0.4 10*3/uL (ref 0.1–1.0)
Monocytes Relative: 8 %
Neutro Abs: 2.9 10*3/uL (ref 1.7–7.7)
Neutrophils Relative %: 61 %
Platelet Count: 232 10*3/uL (ref 150–400)
RBC: 3.53 MIL/uL — ABNORMAL LOW (ref 3.87–5.11)
RDW: 15.7 % — ABNORMAL HIGH (ref 11.5–15.5)
WBC Count: 4.7 10*3/uL (ref 4.0–10.5)
nRBC: 0 % (ref 0.0–0.2)

## 2019-11-24 LAB — URINALYSIS, COMPLETE (UACMP) WITH MICROSCOPIC
Bilirubin Urine: NEGATIVE
Glucose, UA: NEGATIVE mg/dL
Ketones, ur: NEGATIVE mg/dL
Nitrite: NEGATIVE
Protein, ur: 100 mg/dL — AB
Specific Gravity, Urine: 1.024 (ref 1.005–1.030)
WBC, UA: >50 WBC/hpf — ABNORMAL HIGH (ref 0–5)
pH: 5 (ref 5.0–8.0)

## 2019-11-24 MED ORDER — SODIUM CHLORIDE 0.9 % IV SOLN
Freq: Once | INTRAVENOUS | Status: AC
  Filled 2019-11-24: qty 250

## 2019-11-24 MED ORDER — CIPROFLOXACIN HCL 500 MG PO TABS: 500.0000 mg | ORAL_TABLET | Freq: Two times a day (BID) | ORAL | 0 refills | Status: DC

## 2019-11-24 MED ORDER — PACLITAXEL PROTEIN-BOUND CHEMO INJECTION 100 MG
100.0000 mg/m2 | Freq: Once | INTRAVENOUS | Status: AC
  Administered 2019-11-24: 200 mg via INTRAVENOUS
  Filled 2019-11-24: qty 40

## 2019-11-24 MED ORDER — SODIUM CHLORIDE 0.9 % IV SOLN
800.0000 mg/m2 | Freq: Once | INTRAVENOUS | Status: AC
  Administered 2019-11-24: 13:00:00 1558 mg via INTRAVENOUS
  Filled 2019-11-24: qty 40.98

## 2019-11-24 MED ORDER — SODIUM CHLORIDE 0.9% FLUSH
10.0000 mL | INTRAVENOUS | Status: DC | PRN
  Administered 2019-11-24: 10 mL
  Filled 2019-11-24: qty 10

## 2019-11-24 MED ORDER — PROCHLORPERAZINE MALEATE 10 MG PO TABS
ORAL_TABLET | ORAL | Status: AC
  Filled 2019-11-24: qty 1

## 2019-11-24 MED ORDER — HEPARIN SOD (PORK) LOCK FLUSH 100 UNIT/ML IV SOLN
500.0000 [IU] | Freq: Once | INTRAVENOUS | Status: AC | PRN
  Administered 2019-11-24: 500 [IU]
  Filled 2019-11-24: qty 5

## 2019-11-24 MED ORDER — SODIUM CHLORIDE 0.9% FLUSH
10.0000 mL | Freq: Once | INTRAVENOUS | Status: AC
  Administered 2019-11-24: 11:00:00 10 mL
  Filled 2019-11-24: qty 10

## 2019-11-24 MED ORDER — PROCHLORPERAZINE MALEATE 10 MG PO TABS
10.0000 mg | ORAL_TABLET | Freq: Once | ORAL | Status: AC
  Administered 2019-11-24: 10 mg via ORAL

## 2019-11-24 NOTE — Telephone Encounter (Signed)
Notified of bacteria in urine--most likely UTI. Script for Cipro 500 mg bid X 5 days. Push po fluids. Will add urine culture. [facility] lab notified

## 2019-11-24 NOTE — Progress Notes (Signed)
[place] OFFICE PROGRESS NOTE   Diagnosis: Pancreas cancer  INTERVAL HISTORY:   Kaylee Mullins completed another cycle of gemcitabine/Abraxane on 11/09/2019.  No fever, rash, or neuropathy symptoms.  She continues to have pain at the left lower abdomen and left lower back/flank.  She does not take pain medication consistently.  She reports taking pain medication on one occasion over the past few weeks.  She complains of burning with urination.  Objective:  Vital signs in last 24 hours:  Blood pressure (!) 161/83, pulse 71, temperature 98.1 F (36.7 C), temperature source Temporal, resp. rate 17, height 5\' 4"  (1.626 m), weight 184 lb 6.4 oz (83.6 kg), SpO2 100 %.     Resp: Lungs clear bilaterally Cardio: Regular rate and rhythm GI: No hepatosplenomegaly, mild tenderness in the left lower abdomen, no mass Vascular: No leg edema    Portacath/PICC-without erythema  Lab Results:  Lab Results  Component Value Date   WBC 4.7 11/24/2019   HGB 11.2 (L) 11/24/2019   HCT 35.1 (L) 11/24/2019   MCV 99.4 11/24/2019   PLT 232 11/24/2019   NEUTROABS 2.9 11/24/2019    CMP  Lab Results  Component Value Date   NA 142 11/24/2019   K 3.5 11/24/2019   CL 105 11/24/2019   CO2 30 11/24/2019   GLUCOSE 64 (L) 11/24/2019   BUN 21 11/24/2019   CREATININE 0.95 11/24/2019   CALCIUM 8.7 (L) 11/24/2019   PROT 7.3 11/24/2019   ALBUMIN 3.5 11/24/2019   AST 36 11/24/2019   ALT 26 11/24/2019   ALKPHOS 98 11/24/2019   BILITOT 0.8 11/24/2019   GFRNONAA 56 (L) 11/24/2019   GFRAA >60 11/24/2019     Medications: I have reviewed the patient's current medications.   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 CT abdomen/pelvis 10/23/2016-ampullary mass, single mildly enlarged porta hepatis lymph node, no evidence of distant metastatic disease ? Status post  pancreaticoduodenectomy 01/12/2017; pT3pN2 ? CT abdomen/pelvis 03/30/2018-ablation defect within the upper pole of the right kidney. No evidence of recurrent renal mass. New mild left periaortic retroperitoneal lymphadenopathy. ? CT abdomen/pelvis 06/30/2018-6 mm right middle lobe nodule-slowly growing over multiple CTs, stable ablation defect in the right kidney, 1.9 cm left periaortic node increased from 1.2 cm, stable 1.5 cm porta hepatis node ? CT abdomen/pelvis 09/17/2018-surgical changes related to Whipple procedure. Stable upper abdominal/retroperitoneal lymphadenopathy. 5 mm right middle lobe nodule, stable from most recent CT but mildly progressive from priors. Postprocedural changes related to renal ablation in the medial right upper kidney. ? CTs 03/24/2019-a few scattered pulmonary nodules appears stable; liver has a slightly shrunken appearance and nodular appearance compatible with mild cirrhosis. No suspicious cystic or solid hepatic lesions. Upper abdominal and retroperitoneal lymphadenopathy appears similar compared to the prior study. No other new lymphadenopathy noted elsewhere in the abdomen or pelvis. ? PET scan 07/03/2019-hypermetabolic lymph node adjacent porta hepatis. Newfocal hypermetabolic lesion in the liver. Hypermetabolic activity in the left suprarenal location favored metastatic adenopathy in the retroperitoneum. Diffuse hypermetabolic activity within the stomach favor gastritis. ? Cycle 1 gemcitabine/Abraxane 08/31/2019 ? Cycle 2 gemcitabine/Abraxane 09/14/2019 ? Cycle 3 gemcitabine/Abraxane 09/29/2019 ? Cycle 4 gemcitabine/Abraxane 10/13/2019 ? Cycle 5 gemcitabine/Abraxane 10/27/2019 ? Cycle 6 gemcitabine/Abraxane 11/09/2019  ? CTs 11/13/2019-new 2 mm right apical and 3 mm left lower lobe nodules, stable portacaval node, enlargement of amorphous soft tissue encasing the left renal artery, stable liver lesion ? Cycle 7  gemcitabine/Abraxane 11/24/2019 2. Biliary  obstruction secondary to #1, status post placement of a metal, bile duct stent on 10/23/2016  3. 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 Renal cell carcinoma-status post ablation of a right renal mass 07/15/2016, biopsy confirmed papillary renal cell carcinoma, Fuhrman grade 3  5. CVA in 2016  6. History of thyroid cancer-status post thyroidectomy and radioactive iodine in 2005  7. Diabetes  8.Hypertension  9.Left lower extremity deep vein thrombosis June 2019, IVC filter placed July 2018 after she was diagnosed with a rectus hematoma while on Lovenox  10. Hospitalized 04/12/2019 through 04/14/2019 with traumatic hematoma right lower leg, followed at the wound clinic  11.Port-A-Cath placement interventional radiology 08/29/2019    Disposition: [name] appears stable.  I reviewed the CT findings with her.  I reviewed the images.  The soft tissue near the left renal artery is slightly larger, but there was a 46-month interval between the baseline PET and initiation of gemcitabine/Abraxane.  The CA 19-9 is lower and her pain has improved.  I recommend continuing gemcitabine/Abraxane.  She will complete a course of ciprofloxacin for a urinary tract infection.  [name] will return for an office visit and chemotherapy in 2 weeks.  Betsy Coder, MD  11/24/2019  11:21 AM

## 2019-11-24 NOTE — Patient Instructions (Signed)

## 2019-11-24 NOTE — Patient Instructions (Signed)
[facility] Discharge Instructions for Patients Receiving Chemotherapy  Today you received the following chemotherapy agents: Paclitaxel protein-bound (Abraxane) and Gemcitabine (Gemzar)  To help prevent nausea and vomiting after your treatment, we encourage you to take your nausea medication as directed.   If you develop nausea and vomiting that is not controlled by your nausea medication, call the clinic.   BELOW ARE SYMPTOMS THAT SHOULD BE REPORTED IMMEDIATELY:  *FEVER GREATER THAN 100.5 F  *CHILLS WITH OR WITHOUT FEVER  NAUSEA AND VOMITING THAT IS NOT CONTROLLED WITH YOUR NAUSEA MEDICATION  *UNUSUAL SHORTNESS OF BREATH  *UNUSUAL BRUISING OR BLEEDING  TENDERNESS IN MOUTH AND THROAT WITH OR WITHOUT PRESENCE OF ULCERS  *URINARY PROBLEMS  *BOWEL PROBLEMS  UNUSUAL RASH Items with * indicate a potential emergency and should be followed up as soon as possible.  Feel free to call the clinic should you have any questions or concerns. The clinic phone number is [phone].  Please show the [place] at check-in to the Emergency Department and triage nurse.

## 2019-11-25 LAB — CANCER ANTIGEN 19-9: CA 19-9: 3571 U/mL — ABNORMAL HIGH (ref 0–35)

## 2019-11-26 LAB — URINE CULTURE: Culture: >=100000 — AB

## 2019-12-01 NOTE — Progress Notes (Signed)
Pharmacist Chemotherapy Monitoring - Follow Up Assessment    I verify that I have reviewed each item in the below checklist:  . Regimen for the patient is scheduled for the appropriate day and plan matches scheduled date. Marland Kitchen Appropriate non-routine labs are ordered dependent on drug ordered. . If applicable, additional medications reviewed and ordered per protocol based on lifetime cumulative doses and/or treatment regimen.   Plan for follow-up and/or issues identified: No . I-vent associated with next due treatment: No . MD and/or nursing notified: No  Philomena Course 12/01/2019 8:57 AM

## 2019-12-03 ENCOUNTER — Other Ambulatory Visit: Payer: Self-pay | Admitting: Oncology

## 2019-12-07 ENCOUNTER — Encounter: Payer: Self-pay | Admitting: Nurse Practitioner

## 2019-12-07 ENCOUNTER — Other Ambulatory Visit: Payer: Self-pay

## 2019-12-07 ENCOUNTER — Inpatient Hospital Stay: Payer: Medicare Other | Admitting: Nurse Practitioner

## 2019-12-07 ENCOUNTER — Telehealth: Payer: Self-pay | Admitting: Nurse Practitioner

## 2019-12-07 ENCOUNTER — Inpatient Hospital Stay: Payer: Medicare Other

## 2019-12-07 VITALS — BP 158/78 | HR 62 | Temp 97.6°F | Resp 17 | Ht 64.0 in | Wt 185.0 lb

## 2019-12-07 DIAGNOSIS — Z95828 Presence of other vascular implants and grafts: Secondary | ICD-10-CM

## 2019-12-07 DIAGNOSIS — C241 Malignant neoplasm of ampulla of Vater: Secondary | ICD-10-CM

## 2019-12-07 DIAGNOSIS — C801 Malignant (primary) neoplasm, unspecified: Secondary | ICD-10-CM

## 2019-12-07 DIAGNOSIS — Z5111 Encounter for antineoplastic chemotherapy: Secondary | ICD-10-CM | POA: Diagnosis not present

## 2019-12-07 LAB — CMP (CANCER CENTER ONLY)
ALT: 22 U/L (ref 0–44)
AST: 32 U/L (ref 15–41)
Albumin: 3.4 g/dL — ABNORMAL LOW (ref 3.5–5.0)
Alkaline Phosphatase: 77 U/L (ref 38–126)
Anion gap: 8 (ref 5–15)
BUN: 22 mg/dL (ref 8–23)
CO2: 25 mmol/L (ref 22–32)
Calcium: 8.2 mg/dL — ABNORMAL LOW (ref 8.9–10.3)
Chloride: 106 mmol/L (ref 98–111)
Creatinine: 1.08 mg/dL — ABNORMAL HIGH (ref 0.44–1.00)
GFR, Est AFR Am: 56 mL/min — ABNORMAL LOW (ref >60)
GFR, Estimated: 48 mL/min — ABNORMAL LOW (ref >60)
Glucose, Bld: 112 mg/dL — ABNORMAL HIGH (ref 70–99)
Potassium: 4 mmol/L (ref 3.5–5.1)
Sodium: 139 mmol/L (ref 135–145)
Total Bilirubin: 0.6 mg/dL (ref 0.3–1.2)
Total Protein: 7 g/dL (ref 6.5–8.1)

## 2019-12-07 LAB — CBC WITH DIFFERENTIAL (CANCER CENTER ONLY)
Abs Immature Granulocytes: 0.03 10*3/uL (ref 0.00–0.07)
Basophils Absolute: 0 10*3/uL (ref 0.0–0.1)
Basophils Relative: 0 %
Eosinophils Absolute: 0.1 10*3/uL (ref 0.0–0.5)
Eosinophils Relative: 1 %
HCT: 34.6 % — ABNORMAL LOW (ref 36.0–46.0)
Hemoglobin: 10.9 g/dL — ABNORMAL LOW (ref 12.0–15.0)
Immature Granulocytes: 1 %
Lymphocytes Relative: 28 %
Lymphs Abs: 1.6 10*3/uL (ref 0.7–4.0)
MCH: 31.2 pg (ref 26.0–34.0)
MCHC: 31.5 g/dL (ref 30.0–36.0)
MCV: 99.1 fL (ref 80.0–100.0)
Monocytes Absolute: 0.4 10*3/uL (ref 0.1–1.0)
Monocytes Relative: 7 %
Neutro Abs: 3.7 10*3/uL (ref 1.7–7.7)
Neutrophils Relative %: 63 %
Platelet Count: 235 10*3/uL (ref 150–400)
RBC: 3.49 MIL/uL — ABNORMAL LOW (ref 3.87–5.11)
RDW: 15.6 % — ABNORMAL HIGH (ref 11.5–15.5)
WBC Count: 5.8 10*3/uL (ref 4.0–10.5)
nRBC: 0 % (ref 0.0–0.2)

## 2019-12-07 MED ORDER — SODIUM CHLORIDE 0.9 % IV SOLN
Freq: Once | INTRAVENOUS | Status: AC
  Filled 2019-12-07: qty 250

## 2019-12-07 MED ORDER — PACLITAXEL PROTEIN-BOUND CHEMO INJECTION 100 MG
100.0000 mg/m2 | Freq: Once | INTRAVENOUS | Status: AC
  Administered 2019-12-07: 200 mg via INTRAVENOUS
  Filled 2019-12-07: qty 40

## 2019-12-07 MED ORDER — PROCHLORPERAZINE MALEATE 10 MG PO TABS
10.0000 mg | ORAL_TABLET | Freq: Once | ORAL | Status: AC
  Administered 2019-12-07: 10 mg via ORAL

## 2019-12-07 MED ORDER — SODIUM CHLORIDE 0.9% FLUSH
10.0000 mL | INTRAVENOUS | Status: DC | PRN
  Administered 2019-12-07: 10 mL
  Filled 2019-12-07: qty 10

## 2019-12-07 MED ORDER — PROCHLORPERAZINE MALEATE 10 MG PO TABS
ORAL_TABLET | ORAL | Status: AC
  Filled 2019-12-07: qty 1

## 2019-12-07 MED ORDER — HEPARIN SOD (PORK) LOCK FLUSH 100 UNIT/ML IV SOLN
500.0000 [IU] | Freq: Once | INTRAVENOUS | Status: AC | PRN
  Administered 2019-12-07: 500 [IU]
  Filled 2019-12-07: qty 5

## 2019-12-07 MED ORDER — SODIUM CHLORIDE 0.9% FLUSH
10.0000 mL | Freq: Once | INTRAVENOUS | Status: AC
  Administered 2019-12-07: 12:00:00 10 mL
  Filled 2019-12-07: qty 10

## 2019-12-07 MED ORDER — SODIUM CHLORIDE 0.9 % IV SOLN
800.0000 mg/m2 | Freq: Once | INTRAVENOUS | Status: AC
  Administered 2019-12-07: 1558 mg via INTRAVENOUS
  Filled 2019-12-07: qty 40.98

## 2019-12-07 NOTE — Telephone Encounter (Signed)
Added additional cycle per 4/22 los - pt to get an updated schedule next visit.

## 2019-12-07 NOTE — Progress Notes (Signed)
[place] OFFICE PROGRESS NOTE   Diagnosis: Pancreas cancer  INTERVAL HISTORY:   Kaylee Mullins returns as scheduled.  She completed another cycle of gemcitabine/Abraxane 11/24/2019.  Urinary symptoms have resolved.  No nausea or vomiting.  No diarrhea.  No skin rash.  She denies fever and cough.  No numbness or tingling in the hands or feet.  Pain continues to be improved.  She received the second Covid vaccine on 11/10/2019.  Objective:  Vital signs in last 24 hours:  Blood pressure (!) 158/78, pulse 62, temperature 97.6 F (36.4 C), temperature source Temporal, resp. rate 17, height 5\' 4"  (1.626 m), weight 185 lb (83.9 kg), SpO2 100 %.    HEENT: No thrush or ulcers. GI: Abdomen soft.  Mild tenderness left lower abdomen.  No hepatomegaly. Vascular: Trace edema at the lower legs bilaterally. Neuro: Alert and oriented. Skin: No rash. Port-A-Cath without erythema.   Lab Results:  Lab Results  Component Value Date   WBC 5.8 12/07/2019   HGB 10.9 (L) 12/07/2019   HCT 34.6 (L) 12/07/2019   MCV 99.1 12/07/2019   PLT 235 12/07/2019   NEUTROABS 3.7 12/07/2019    Imaging:  No results found.  Medications: I have reviewed the patient's current medications.  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 CT abdomen/pelvis 10/23/2016-ampullary mass, single mildly enlarged porta hepatis lymph node, no evidence of distant metastatic disease ? Status post pancreaticoduodenectomy 01/12/2017; pT3pN2 ? CT abdomen/pelvis 03/30/2018-ablation defect within the upper pole of the right kidney. No evidence of recurrent renal mass. New mild left periaortic retroperitoneal lymphadenopathy. ? CT abdomen/pelvis 06/30/2018-6 mm right middle lobe nodule-slowly growing over multiple CTs, stable ablation defect in the right kidney, 1.9 cm left periaortic node increased from 1.2 cm, stable  1.5 cm porta hepatis node ? CT abdomen/pelvis 09/17/2018-surgical changes related to Whipple procedure. Stable upper abdominal/retroperitoneal lymphadenopathy. 5 mm right middle lobe nodule, stable from most recent CT but mildly progressive from priors. Postprocedural changes related to renal ablation in the medial right upper kidney. ? CTs 03/24/2019-a few scattered pulmonary nodules appears stable; liver has a slightly shrunken appearance and nodular appearance compatible with mild cirrhosis. No suspicious cystic or solid hepatic lesions. Upper abdominal and retroperitoneal lymphadenopathy appears similar compared to the prior study. No other new lymphadenopathy noted elsewhere in the abdomen or pelvis. ? PET scan 07/03/2019-hypermetabolic lymph node adjacent porta hepatis. Newfocal hypermetabolic lesion in the liver. Hypermetabolic activity in the left suprarenal location favored metastatic adenopathy in the retroperitoneum. Diffuse hypermetabolic activity within the stomach favor gastritis. ? Cycle 1 gemcitabine/Abraxane 08/31/2019 ? Cycle 2 gemcitabine/Abraxane 09/14/2019 ? Cycle 3 gemcitabine/Abraxane 09/29/2019 ? Cycle 4 gemcitabine/Abraxane 10/13/2019 ? Cycle 5 gemcitabine/Abraxane 10/27/2019 ? Cycle 6 gemcitabine/Abraxane 11/09/2019  ? CTs 11/13/2019-new 2 mm right apical and 3 mm left lower lobe nodules, stable portacaval node, enlargement of amorphous soft tissue encasing the left renal artery, stable liver lesion ? Cycle 7 gemcitabine/Abraxane 11/24/2019 ? Cycle 8 gemcitabine/Abraxane 12/07/2019 2. Biliary obstruction secondary to #1, status post placement of a metal, bile duct stent on 10/23/2016  3. 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 Renal cell carcinoma-status post ablation of a right renal mass 07/15/2016, biopsy confirmed papillary renal cell carcinoma, Fuhrman grade 3  5. CVA in 2016  6. History of thyroid cancer-status post thyroidectomy and  radioactive iodine in 2005  7. Diabetes  8.Hypertension  9.Left lower extremity deep vein thrombosis  June 2019, IVC filter placed July 2018 after she was diagnosed with a rectus hematoma while on Lovenox  10. Hospitalized 04/12/2019 through 04/14/2019 with traumatic hematoma right lower leg, followed at the wound clinic  11.Port-A-Cath placement interventional radiology 08/29/2019  12.   Covid vaccine-second injection administered 11/10/2019   Disposition: Kaylee Mullins appears stable.  She has completed 7 cycles of gemcitabine/Abraxane.  She continues to tolerate treatment well.  Plan to proceed with cycle 8 today as scheduled.  We will follow-up on the CA 19-9.  We reviewed the CBC from today.  Counts adequate to proceed as above.  She will return for lab, follow-up, gemcitabine/Abraxane in 2 weeks.  She will contact the office in the interim with any problems.    [name] ANP/GNP-BC   12/07/2019  12:44 PM

## 2019-12-07 NOTE — Patient Instructions (Signed)
[facility] Discharge Instructions for Patients Receiving Chemotherapy  Today you received the following chemotherapy agents: Abraxane/Gemzar.  To help prevent nausea and vomiting after your treatment, we encourage you to take your nausea medication as directed.   If you develop nausea and vomiting that is not controlled by your nausea medication, call the clinic.   BELOW ARE SYMPTOMS THAT SHOULD BE REPORTED IMMEDIATELY:  *FEVER GREATER THAN 100.5 F  *CHILLS WITH OR WITHOUT FEVER  NAUSEA AND VOMITING THAT IS NOT CONTROLLED WITH YOUR NAUSEA MEDICATION  *UNUSUAL SHORTNESS OF BREATH  *UNUSUAL BRUISING OR BLEEDING  TENDERNESS IN MOUTH AND THROAT WITH OR WITHOUT PRESENCE OF ULCERS  *URINARY PROBLEMS  *BOWEL PROBLEMS  UNUSUAL RASH Items with * indicate a potential emergency and should be followed up as soon as possible.  Feel free to call the clinic should you have any questions or concerns. The clinic phone number is [phone].  Please show the CHEMO ALERT CARD at check-in to the Emergency Department and triage nurse.   

## 2019-12-08 LAB — CANCER ANTIGEN 19-9: CA 19-9: 3032 U/mL — ABNORMAL HIGH (ref 0–35)

## 2019-12-17 ENCOUNTER — Other Ambulatory Visit: Payer: Self-pay | Admitting: Oncology

## 2019-12-18 NOTE — Progress Notes (Signed)
Pharmacist Chemotherapy Monitoring - Follow Up Assessment    I verify that I have reviewed each item in the below checklist:  . Regimen for the patient is scheduled for the appropriate day and plan matches scheduled date. Marland Kitchen Appropriate non-routine labs are ordered dependent on drug ordered. . If applicable, additional medications reviewed and ordered per protocol based on lifetime cumulative doses and/or treatment regimen.   Plan for follow-up and/or issues identified: No . I-vent associated with next due treatment: No . MD and/or nursing notified: No   Kaylee Mullins, Pharm.D., CPP 12/18/2019@12 :13 PM

## 2019-12-22 ENCOUNTER — Encounter: Payer: Self-pay | Admitting: Nurse Practitioner

## 2019-12-22 ENCOUNTER — Inpatient Hospital Stay: Payer: Medicare Other

## 2019-12-22 ENCOUNTER — Other Ambulatory Visit: Payer: Self-pay

## 2019-12-22 ENCOUNTER — Inpatient Hospital Stay: Payer: Medicare Other | Attending: Internal Medicine | Admitting: Nurse Practitioner

## 2019-12-22 VITALS — BP 157/83 | HR 70 | Temp 97.8°F | Resp 17 | Ht 64.0 in | Wt 184.3 lb

## 2019-12-22 DIAGNOSIS — C801 Malignant (primary) neoplasm, unspecified: Secondary | ICD-10-CM

## 2019-12-22 DIAGNOSIS — Z85850 Personal history of malignant neoplasm of thyroid: Secondary | ICD-10-CM | POA: Insufficient documentation

## 2019-12-22 DIAGNOSIS — C241 Malignant neoplasm of ampulla of Vater: Secondary | ICD-10-CM | POA: Insufficient documentation

## 2019-12-22 DIAGNOSIS — E890 Postprocedural hypothyroidism: Secondary | ICD-10-CM | POA: Diagnosis not present

## 2019-12-22 DIAGNOSIS — Z85528 Personal history of other malignant neoplasm of kidney: Secondary | ICD-10-CM | POA: Insufficient documentation

## 2019-12-22 DIAGNOSIS — Z86718 Personal history of other venous thrombosis and embolism: Secondary | ICD-10-CM | POA: Diagnosis not present

## 2019-12-22 DIAGNOSIS — G893 Neoplasm related pain (acute) (chronic): Secondary | ICD-10-CM | POA: Insufficient documentation

## 2019-12-22 DIAGNOSIS — Z5111 Encounter for antineoplastic chemotherapy: Secondary | ICD-10-CM | POA: Diagnosis present

## 2019-12-22 DIAGNOSIS — Z95828 Presence of other vascular implants and grafts: Secondary | ICD-10-CM

## 2019-12-22 DIAGNOSIS — I10 Essential (primary) hypertension: Secondary | ICD-10-CM | POA: Diagnosis not present

## 2019-12-22 DIAGNOSIS — G620 Drug-induced polyneuropathy: Secondary | ICD-10-CM | POA: Insufficient documentation

## 2019-12-22 DIAGNOSIS — Z7901 Long term (current) use of anticoagulants: Secondary | ICD-10-CM | POA: Insufficient documentation

## 2019-12-22 DIAGNOSIS — E1140 Type 2 diabetes mellitus with diabetic neuropathy, unspecified: Secondary | ICD-10-CM | POA: Diagnosis not present

## 2019-12-22 LAB — CMP (CANCER CENTER ONLY)
ALT: 22 U/L (ref 0–44)
AST: 25 U/L (ref 15–41)
Albumin: 3.5 g/dL (ref 3.5–5.0)
Alkaline Phosphatase: 89 U/L (ref 38–126)
Anion gap: 11 (ref 5–15)
BUN: 23 mg/dL (ref 8–23)
CO2: 25 mmol/L (ref 22–32)
Calcium: 8.4 mg/dL — ABNORMAL LOW (ref 8.9–10.3)
Chloride: 106 mmol/L (ref 98–111)
Creatinine: 1.02 mg/dL — ABNORMAL HIGH (ref 0.44–1.00)
GFR, Est AFR Am: 60 mL/min — ABNORMAL LOW (ref >60)
GFR, Estimated: 52 mL/min — ABNORMAL LOW (ref >60)
Glucose, Bld: 198 mg/dL — ABNORMAL HIGH (ref 70–99)
Potassium: 3.7 mmol/L (ref 3.5–5.1)
Sodium: 142 mmol/L (ref 135–145)
Total Bilirubin: 0.8 mg/dL (ref 0.3–1.2)
Total Protein: 6.9 g/dL (ref 6.5–8.1)

## 2019-12-22 LAB — CBC WITH DIFFERENTIAL (CANCER CENTER ONLY)
Abs Immature Granulocytes: 0.03 10*3/uL (ref 0.00–0.07)
Basophils Absolute: 0 10*3/uL (ref 0.0–0.1)
Basophils Relative: 0 %
Eosinophils Absolute: 0 10*3/uL (ref 0.0–0.5)
Eosinophils Relative: 1 %
HCT: 34.6 % — ABNORMAL LOW (ref 36.0–46.0)
Hemoglobin: 10.9 g/dL — ABNORMAL LOW (ref 12.0–15.0)
Immature Granulocytes: 1 %
Lymphocytes Relative: 32 %
Lymphs Abs: 1.4 10*3/uL (ref 0.7–4.0)
MCH: 31.1 pg (ref 26.0–34.0)
MCHC: 31.5 g/dL (ref 30.0–36.0)
MCV: 98.6 fL (ref 80.0–100.0)
Monocytes Absolute: 0.3 10*3/uL (ref 0.1–1.0)
Monocytes Relative: 7 %
Neutro Abs: 2.7 10*3/uL (ref 1.7–7.7)
Neutrophils Relative %: 59 %
Platelet Count: 198 10*3/uL (ref 150–400)
RBC: 3.51 MIL/uL — ABNORMAL LOW (ref 3.87–5.11)
RDW: 15.7 % — ABNORMAL HIGH (ref 11.5–15.5)
WBC Count: 4.5 10*3/uL (ref 4.0–10.5)
nRBC: 0 % (ref 0.0–0.2)

## 2019-12-22 MED ORDER — PROCHLORPERAZINE MALEATE 10 MG PO TABS
10.0000 mg | ORAL_TABLET | Freq: Once | ORAL | Status: AC
  Administered 2019-12-22: 10 mg via ORAL

## 2019-12-22 MED ORDER — SODIUM CHLORIDE 0.9% FLUSH
10.0000 mL | INTRAVENOUS | Status: DC | PRN
  Administered 2019-12-22: 10 mL
  Filled 2019-12-22: qty 10

## 2019-12-22 MED ORDER — SODIUM CHLORIDE 0.9 % IV SOLN
800.0000 mg/m2 | Freq: Once | INTRAVENOUS | Status: AC
  Administered 2019-12-22: 1558 mg via INTRAVENOUS
  Filled 2019-12-22: qty 40.98

## 2019-12-22 MED ORDER — SODIUM CHLORIDE 0.9 % IV SOLN
Freq: Once | INTRAVENOUS | Status: AC
  Filled 2019-12-22: qty 250

## 2019-12-22 MED ORDER — PROCHLORPERAZINE MALEATE 10 MG PO TABS
ORAL_TABLET | ORAL | Status: AC
  Filled 2019-12-22: qty 1

## 2019-12-22 MED ORDER — SODIUM CHLORIDE 0.9% FLUSH
10.0000 mL | Freq: Once | INTRAVENOUS | Status: AC
  Administered 2019-12-22: 10 mL
  Filled 2019-12-22: qty 10

## 2019-12-22 MED ORDER — HEPARIN SOD (PORK) LOCK FLUSH 100 UNIT/ML IV SOLN
500.0000 [IU] | Freq: Once | INTRAVENOUS | Status: AC | PRN
  Administered 2019-12-22: 500 [IU]
  Filled 2019-12-22: qty 5

## 2019-12-22 NOTE — Progress Notes (Signed)
[place] OFFICE PROGRESS NOTE   Diagnosis: Pancreas cancer  INTERVAL HISTORY:   Ms. Gains returns as scheduled.  She completed cycle 8 gemcitabine/Abraxane 12/07/2019.  No significant nausea.  She notes that she "burps" frequently.  She had diarrhea for 1 day last week.  No fever or rash following treatment.  She denies shortness of breath.  She continues to have left-sided mid to low back pain.  She does not take pain medication on a daily basis.  About a week ago she noticed mild numbness in several toes.  No numbness in the hands.  Objective:  Vital signs in last 24 hours:  Blood pressure (!) 157/83, pulse 70, temperature 97.8 F (36.6 C), temperature source Temporal, resp. rate 17, height 5\' 4"  (1.626 m), weight 184 lb 4.8 oz (83.6 kg), SpO2 100 %.    HEENT: No thrush or ulcers. Resp: Lungs clear bilaterally. Cardio: Regular rate and rhythm. GI: Abdomen soft and nontender.  No hepatomegaly.  No mass. Vascular: Trace edema at the lower legs bilaterally. Neuro: Vibratory sense moderately decreased over the fingertips per tuning fork exam. Skin: No rash. Port-A-Cath without erythema.   Lab Results:  Lab Results  Component Value Date   WBC 4.5 12/22/2019   HGB 10.9 (L) 12/22/2019   HCT 34.6 (L) 12/22/2019   MCV 98.6 12/22/2019   PLT 198 12/22/2019   NEUTROABS 2.7 12/22/2019    Imaging:  No results found.  Medications: I have reviewed the patient's current medications.  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 CT abdomen/pelvis 10/23/2016-ampullary mass, single mildly enlarged porta hepatis lymph node, no evidence of distant metastatic disease ? Status post pancreaticoduodenectomy 01/12/2017; pT3pN2 ? CT abdomen/pelvis 03/30/2018-ablation defect within the upper pole of the right kidney. No evidence of recurrent renal mass. New mild left  periaortic retroperitoneal lymphadenopathy. ? CT abdomen/pelvis 06/30/2018-6 mm right middle lobe nodule-slowly growing over multiple CTs, stable ablation defect in the right kidney, 1.9 cm left periaortic node increased from 1.2 cm, stable 1.5 cm porta hepatis node ? CT abdomen/pelvis 09/17/2018-surgical changes related to Whipple procedure. Stable upper abdominal/retroperitoneal lymphadenopathy. 5 mm right middle lobe nodule, stable from most recent CT but mildly progressive from priors. Postprocedural changes related to renal ablation in the medial right upper kidney. ? CTs 03/24/2019-a few scattered pulmonary nodules appears stable; liver has a slightly shrunken appearance and nodular appearance compatible with mild cirrhosis. No suspicious cystic or solid hepatic lesions. Upper abdominal and retroperitoneal lymphadenopathy appears similar compared to the prior study. No other new lymphadenopathy noted elsewhere in the abdomen or pelvis. ? PET scan 07/03/2019-hypermetabolic lymph node adjacent porta hepatis. Newfocal hypermetabolic lesion in the liver. Hypermetabolic activity in the left suprarenal location favored metastatic adenopathy in the retroperitoneum. Diffuse hypermetabolic activity within the stomach favor gastritis. ? Cycle 1 gemcitabine/Abraxane 08/31/2019 ? Cycle 2 gemcitabine/Abraxane 09/14/2019 ? Cycle 3 gemcitabine/Abraxane 09/29/2019 ? Cycle 4 gemcitabine/Abraxane 10/13/2019 ? Cycle 5 gemcitabine/Abraxane 10/27/2019 ? Cycle 6 gemcitabine/Abraxane 11/09/2019 ? CTs 11/13/2019-new 2 mm right apical and 3 mm left lower lobe nodules, stable portacaval node, enlargement of amorphous soft tissue encasing the left renal artery, stable liver lesion ? Cycle 7 gemcitabine/Abraxane 11/24/2019 ? Cycle 8 gemcitabine/Abraxane 12/07/2019 ? 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 Biliary obstruction secondary to #1, status post placement of a metal, bile duct stent  on 10/23/2016  3. 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 Renal cell carcinoma-status post ablation of a right renal mass 07/15/2016, biopsy confirmed papillary renal cell carcinoma, Fuhrman grade 3  5. CVA in 2016  6. History of thyroid cancer-status post thyroidectomy and radioactive iodine in 2005  7. Diabetes  8.Hypertension  9.Left lower extremity deep vein thrombosis June 2019, IVC filter placed July 2018 after she was diagnosed with a rectus hematoma while on Lovenox  10. Hospitalized 04/12/2019 through 04/14/2019 with traumatic hematoma right lower leg, followed at the wound clinic  11.Port-A-Cath placement interventional radiology 08/29/2019  12.   Covid vaccine-second injection administered 11/10/2019   Disposition: Ms. Greenslade appears stable.  She has completed 8 cycles of gemcitabine/Abraxane.  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  Plan to proceed with gemcitabine alone today.  We will reevaluate in 2 weeks.  We reviewed the CBC and chemistry panel from today.  Labs adequate to proceed with gemcitabine.  She will return for lab, follow-up, gemcitabine/Abraxane in 2 weeks.  Plan reviewed with Dr. Benay Spice.   [name] ANP/GNP-BC   12/22/2019  12:47 PM

## 2019-12-22 NOTE — Patient Instructions (Signed)
[facility] °Discharge Instructions for Patients Receiving Chemotherapy ° °Today you received the following chemotherapy agents Gemzar ° °To help prevent nausea and vomiting after your treatment, we encourage you to take your nausea medication as directed. °  °If you develop nausea and vomiting that is not controlled by your nausea medication, call the clinic.  ° °BELOW ARE SYMPTOMS THAT SHOULD BE REPORTED IMMEDIATELY: °· *FEVER GREATER THAN 100.5 F °· *CHILLS WITH OR WITHOUT FEVER °· NAUSEA AND VOMITING THAT IS NOT CONTROLLED WITH YOUR NAUSEA MEDICATION °· *UNUSUAL SHORTNESS OF BREATH °· *UNUSUAL BRUISING OR BLEEDING °· TENDERNESS IN MOUTH AND THROAT WITH OR WITHOUT PRESENCE OF ULCERS °· *URINARY PROBLEMS °· *BOWEL PROBLEMS °· UNUSUAL RASH °Items with * indicate a potential emergency and should be followed up as soon as possible. ° °Feel free to call the clinic should you have any questions or concerns. The clinic phone number is [phone]. ° °Please show the CHEMO ALERT CARD at check-in to the Emergency Department and triage nurse. ° ° °

## 2019-12-23 LAB — CANCER ANTIGEN 19-9: CA 19-9: 3355 U/mL — ABNORMAL HIGH (ref 0–35)

## 2019-12-26 ENCOUNTER — Telehealth: Payer: Self-pay | Admitting: Oncology

## 2019-12-26 NOTE — Telephone Encounter (Signed)
Spoke with pt. Pt aware of appts. Scheduled per 5/7 los.

## 2019-12-31 ENCOUNTER — Other Ambulatory Visit: Payer: Self-pay | Admitting: Oncology

## 2020-01-01 NOTE — Progress Notes (Signed)
Pharmacist Chemotherapy Monitoring - Follow Up Assessment    I verify that I have reviewed each item in the below checklist:  . Regimen for the patient is scheduled for the appropriate day and plan matches scheduled date. Marland Kitchen Appropriate non-routine labs are ordered dependent on drug ordered. . If applicable, additional medications reviewed and ordered per protocol based on lifetime cumulative doses and/or treatment regimen.   Plan for follow-up and/or issues identified: No . I-vent associated with next due treatment: No . MD and/or nursing notified: No  Romualdo Bolk [facility] 01/01/2020 8:51 AM 2

## 2020-01-05 ENCOUNTER — Inpatient Hospital Stay: Payer: Medicare Other

## 2020-01-05 ENCOUNTER — Inpatient Hospital Stay: Payer: Medicare Other | Admitting: Oncology

## 2020-01-05 ENCOUNTER — Other Ambulatory Visit: Payer: Self-pay

## 2020-01-05 VITALS — BP 118/64 | HR 61 | Temp 97.9°F | Resp 17 | Ht 64.0 in | Wt 183.1 lb

## 2020-01-05 DIAGNOSIS — C241 Malignant neoplasm of ampulla of Vater: Secondary | ICD-10-CM | POA: Diagnosis not present

## 2020-01-05 DIAGNOSIS — Z5111 Encounter for antineoplastic chemotherapy: Secondary | ICD-10-CM | POA: Diagnosis not present

## 2020-01-05 DIAGNOSIS — C801 Malignant (primary) neoplasm, unspecified: Secondary | ICD-10-CM

## 2020-01-05 DIAGNOSIS — Z95828 Presence of other vascular implants and grafts: Secondary | ICD-10-CM

## 2020-01-05 LAB — CMP (CANCER CENTER ONLY)
ALT: 40 U/L (ref 0–44)
AST: 44 U/L — ABNORMAL HIGH (ref 15–41)
Albumin: 3.5 g/dL (ref 3.5–5.0)
Alkaline Phosphatase: 99 U/L (ref 38–126)
Anion gap: 10 (ref 5–15)
BUN: 17 mg/dL (ref 8–23)
CO2: 22 mmol/L (ref 22–32)
Calcium: 8.4 mg/dL — ABNORMAL LOW (ref 8.9–10.3)
Chloride: 110 mmol/L (ref 98–111)
Creatinine: 1.35 mg/dL — ABNORMAL HIGH (ref 0.44–1.00)
GFR, Est AFR Am: 43 mL/min — ABNORMAL LOW (ref >60)
GFR, Estimated: 37 mL/min — ABNORMAL LOW (ref >60)
Glucose, Bld: 191 mg/dL — ABNORMAL HIGH (ref 70–99)
Potassium: 3.3 mmol/L — ABNORMAL LOW (ref 3.5–5.1)
Sodium: 142 mmol/L (ref 135–145)
Total Bilirubin: 0.7 mg/dL (ref 0.3–1.2)
Total Protein: 6.7 g/dL (ref 6.5–8.1)

## 2020-01-05 LAB — CBC WITH DIFFERENTIAL (CANCER CENTER ONLY)
Abs Immature Granulocytes: 0.01 10*3/uL (ref 0.00–0.07)
Basophils Absolute: 0 10*3/uL (ref 0.0–0.1)
Basophils Relative: 0 %
Eosinophils Absolute: 0 10*3/uL (ref 0.0–0.5)
Eosinophils Relative: 1 %
HCT: 32.2 % — ABNORMAL LOW (ref 36.0–46.0)
Hemoglobin: 10.4 g/dL — ABNORMAL LOW (ref 12.0–15.0)
Immature Granulocytes: 0 %
Lymphocytes Relative: 28 %
Lymphs Abs: 1.4 10*3/uL (ref 0.7–4.0)
MCH: 31.8 pg (ref 26.0–34.0)
MCHC: 32.3 g/dL (ref 30.0–36.0)
MCV: 98.5 fL (ref 80.0–100.0)
Monocytes Absolute: 0.4 10*3/uL (ref 0.1–1.0)
Monocytes Relative: 8 %
Neutro Abs: 3 10*3/uL (ref 1.7–7.7)
Neutrophils Relative %: 63 %
Platelet Count: 216 10*3/uL (ref 150–400)
RBC: 3.27 MIL/uL — ABNORMAL LOW (ref 3.87–5.11)
RDW: 16.2 % — ABNORMAL HIGH (ref 11.5–15.5)
WBC Count: 4.8 10*3/uL (ref 4.0–10.5)
nRBC: 0 % (ref 0.0–0.2)

## 2020-01-05 MED ORDER — PROCHLORPERAZINE MALEATE 10 MG PO TABS
10.0000 mg | ORAL_TABLET | Freq: Once | ORAL | Status: AC
  Administered 2020-01-05: 10 mg via ORAL

## 2020-01-05 MED ORDER — SODIUM CHLORIDE 0.9 % IV SOLN
800.0000 mg/m2 | Freq: Once | INTRAVENOUS | Status: AC
  Administered 2020-01-05: 1558 mg via INTRAVENOUS
  Filled 2020-01-05: qty 40.98

## 2020-01-05 MED ORDER — SODIUM CHLORIDE 0.9 % IV SOLN
Freq: Once | INTRAVENOUS | Status: AC
  Filled 2020-01-05: qty 250

## 2020-01-05 MED ORDER — HEPARIN SOD (PORK) LOCK FLUSH 100 UNIT/ML IV SOLN
500.0000 [IU] | Freq: Once | INTRAVENOUS | Status: AC | PRN
  Administered 2020-01-05: 500 [IU]
  Filled 2020-01-05: qty 5

## 2020-01-05 MED ORDER — SODIUM CHLORIDE 0.9% FLUSH
10.0000 mL | Freq: Once | INTRAVENOUS | Status: AC
  Administered 2020-01-05: 10 mL
  Filled 2020-01-05: qty 10

## 2020-01-05 MED ORDER — PROCHLORPERAZINE MALEATE 10 MG PO TABS
ORAL_TABLET | ORAL | Status: AC
  Filled 2020-01-05: qty 1

## 2020-01-05 MED ORDER — PACLITAXEL PROTEIN-BOUND CHEMO INJECTION 100 MG
100.0000 mg/m2 | Freq: Once | INTRAVENOUS | Status: AC
  Administered 2020-01-05: 200 mg via INTRAVENOUS
  Filled 2020-01-05: qty 40

## 2020-01-05 MED ORDER — OXYCODONE-ACETAMINOPHEN 5-325 MG PO TABS: 1.0000 | ORAL_TABLET | Freq: Three times a day (TID) | ORAL | 0 refills | Status: DC | PRN

## 2020-01-05 MED ORDER — SODIUM CHLORIDE 0.9% FLUSH
10.0000 mL | INTRAVENOUS | Status: DC | PRN
  Administered 2020-01-05: 10 mL
  Filled 2020-01-05: qty 10

## 2020-01-05 NOTE — Progress Notes (Signed)
K+ level 3.3 today. She has not been taking the KCL. Per Dr. Benay Spice, resume KCL 20 meq daily.

## 2020-01-05 NOTE — Patient Instructions (Signed)
[facility] Discharge Instructions for Patients Receiving Chemotherapy  Today you received the following chemotherapy agents: Abraxane/Gemzar.  To help prevent nausea and vomiting after your treatment, we encourage you to take your nausea medication as directed.   If you develop nausea and vomiting that is not controlled by your nausea medication, call the clinic.   BELOW ARE SYMPTOMS THAT SHOULD BE REPORTED IMMEDIATELY:  *FEVER GREATER THAN 100.5 F  *CHILLS WITH OR WITHOUT FEVER  NAUSEA AND VOMITING THAT IS NOT CONTROLLED WITH YOUR NAUSEA MEDICATION  *UNUSUAL SHORTNESS OF BREATH  *UNUSUAL BRUISING OR BLEEDING  TENDERNESS IN MOUTH AND THROAT WITH OR WITHOUT PRESENCE OF ULCERS  *URINARY PROBLEMS  *BOWEL PROBLEMS  UNUSUAL RASH Items with * indicate a potential emergency and should be followed up as soon as possible.  Feel free to call the clinic should you have any questions or concerns. The clinic phone number is [phone].  Please show the CHEMO ALERT CARD at check-in to the Emergency Department and triage nurse.   

## 2020-01-05 NOTE — Progress Notes (Signed)
[facility] OFFICE PROGRESS NOTE   Diagnosis: Pancreas cancer  INTERVAL HISTORY:   Ms. Pacific completed a cycle of gemcitabine on 12/22/2019.  She has numbness in the right great toe.  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  No other numbness.  She continues to have pain in the left abdomen and back.  Objective:  Vital signs in last 24 hours:  Blood pressure 118/64, pulse 61, temperature 97.9 F (36.6 C), temperature source Temporal, resp. rate 17, height 5\' 4"  (1.626 m), weight 183 lb 1.6 oz (83.1 kg), SpO2 100 %.    Resp: Lungs clear bilaterally Cardio: Regular rate and rhythm GI: No mass, no hepatosplenomegaly, nontender Vascular: Trace edema at the left greater than right lower leg    Portacath/PICC-without erythema  Lab Results:  Lab Results  Component Value Date   WBC 4.8 01/05/2020   HGB 10.4 (L) 01/05/2020   HCT 32.2 (L) 01/05/2020   MCV 98.5 01/05/2020   PLT 216 01/05/2020   NEUTROABS 3.0 01/05/2020    CMP  Lab Results  Component Value Date   NA 142 01/05/2020   K 3.3 (L) 01/05/2020   CL 110 01/05/2020   CO2 22 01/05/2020   GLUCOSE 191 (H) 01/05/2020   BUN 17 01/05/2020   CREATININE 1.35 (H) 01/05/2020   CALCIUM 8.4 (L) 01/05/2020   PROT 6.7 01/05/2020   ALBUMIN 3.5 01/05/2020   AST 44 (H) 01/05/2020   ALT 40 01/05/2020   ALKPHOS 99 01/05/2020   BILITOT 0.7 01/05/2020   GFRNONAA 37 (L) 01/05/2020   GFRAA 43 (L) 01/05/2020  CA 19-9 on 12/22/2019: 3355  Medications: I have reviewed the patient's current medications.   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 CT abdomen/pelvis 10/23/2016-ampullary mass, single mildly enlarged porta hepatis lymph node, no evidence of distant metastatic disease ? Status post pancreaticoduodenectomy 01/12/2017; pT3pN2 ? CT abdomen/pelvis 03/30/2018-ablation defect within the  upper pole of the right kidney. No evidence of recurrent renal mass. New mild left periaortic retroperitoneal lymphadenopathy. ? CT abdomen/pelvis 06/30/2018-6 mm right middle lobe nodule-slowly growing over multiple CTs, stable ablation defect in the right kidney, 1.9 cm left periaortic node increased from 1.2 cm, stable 1.5 cm porta hepatis node ? CT abdomen/pelvis 09/17/2018-surgical changes related to Whipple procedure. Stable upper abdominal/retroperitoneal lymphadenopathy. 5 mm right middle lobe nodule, stable from most recent CT but mildly progressive from priors. Postprocedural changes related to renal ablation in the medial right upper kidney. ? CTs 03/24/2019-a few scattered pulmonary nodules appears stable; liver has a slightly shrunken appearance and nodular appearance compatible with mild cirrhosis. No suspicious cystic or solid hepatic lesions. Upper abdominal and retroperitoneal lymphadenopathy appears similar compared to the prior study. No other new lymphadenopathy noted elsewhere in the abdomen or pelvis. ? PET scan 07/03/2019-hypermetabolic lymph node adjacent porta hepatis. Newfocal hypermetabolic lesion in the liver. Hypermetabolic activity in the left suprarenal location favored metastatic adenopathy in the retroperitoneum. Diffuse hypermetabolic activity within the stomach favor gastritis. ? Cycle 1 gemcitabine/Abraxane 08/31/2019 ? Cycle 2 gemcitabine/Abraxane 09/14/2019 ? Cycle 3 gemcitabine/Abraxane 09/29/2019 ? Cycle 4 gemcitabine/Abraxane 10/13/2019 ? Cycle 5 gemcitabine/Abraxane 10/27/2019 ? Cycle 6 gemcitabine/Abraxane 11/09/2019 ? CTs 11/13/2019-new 2 mm right apical and 3 mm left lower lobe nodules, stable portacaval node, enlargement of amorphous soft tissue encasing the left renal artery, stable liver lesion ? Cycle 7 gemcitabine/Abraxane 11/24/2019 ? Cycle 8 gemcitabine/Abraxane 12/07/2019 ? Cycle  9 gemcitabine alone 12/22/2019, Abraxane held due to  neuropathy 2. Biliary obstruction secondary to #1, status post placement of a metal, bile duct stent on 10/23/2016  3. 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 Renal cell carcinoma-status post ablation of a right renal mass 07/15/2016, biopsy confirmed papillary renal cell carcinoma, Fuhrman grade 3  5. CVA in 2016  6. History of thyroid cancer-status post thyroidectomy and radioactive iodine in 2005  7. Diabetes  8.Hypertension  9.Left lower extremity deep vein thrombosis June 2019, IVC filter placed July 2018 after she was diagnosed with a rectus hematoma while on Lovenox  10. Hospitalized 04/12/2019 through 04/14/2019 with traumatic hematoma right lower leg, followed at the wound clinic  11.Port-A-Cath placement interventional radiology 08/29/2019  12.   Covid vaccine-second injection administered 11/10/2019     Disposition: Ms. Ara appears stable.  She does not have significant neuropathy symptoms today.  She will complete another treatment with gemcitabine/Abraxane today.  She will return for chemotherapy in 2 weeks.  Ms. Englehart will be scheduled for a restaging CT prior to treatment in 4 weeks.  We refilled her prescription for oxycodone.  Betsy Coder, MD  01/05/2020  12:26 PM

## 2020-01-06 LAB — CANCER ANTIGEN 19-9: CA 19-9: 2377 U/mL — ABNORMAL HIGH (ref 0–35)

## 2020-01-08 ENCOUNTER — Telehealth: Payer: Self-pay | Admitting: Oncology

## 2020-01-08 NOTE — Telephone Encounter (Signed)
Scheduled per los. Called and left msg. Mailed printout  °

## 2020-01-14 ENCOUNTER — Other Ambulatory Visit: Payer: Self-pay | Admitting: Oncology

## 2020-01-17 ENCOUNTER — Telehealth: Payer: Self-pay | Admitting: Nurse Practitioner

## 2020-01-17 NOTE — Telephone Encounter (Signed)
Called pt back per vmail - no answer and vmail full. Unable to leave a message.

## 2020-01-18 ENCOUNTER — Inpatient Hospital Stay: Payer: Medicare Other | Admitting: Medical

## 2020-01-18 ENCOUNTER — Inpatient Hospital Stay: Payer: Medicare Other | Attending: Internal Medicine

## 2020-01-18 ENCOUNTER — Inpatient Hospital Stay: Payer: Medicare Other

## 2020-01-18 ENCOUNTER — Other Ambulatory Visit: Payer: Self-pay

## 2020-01-18 VITALS — BP 140/80 | HR 63 | Temp 97.8°F | Resp 20 | Ht 64.0 in | Wt 184.3 lb

## 2020-01-18 DIAGNOSIS — Z7901 Long term (current) use of anticoagulants: Secondary | ICD-10-CM | POA: Insufficient documentation

## 2020-01-18 DIAGNOSIS — C241 Malignant neoplasm of ampulla of Vater: Secondary | ICD-10-CM

## 2020-01-18 DIAGNOSIS — Z86718 Personal history of other venous thrombosis and embolism: Secondary | ICD-10-CM | POA: Diagnosis not present

## 2020-01-18 DIAGNOSIS — R978 Other abnormal tumor markers: Secondary | ICD-10-CM | POA: Diagnosis not present

## 2020-01-18 DIAGNOSIS — R600 Localized edema: Secondary | ICD-10-CM | POA: Insufficient documentation

## 2020-01-18 DIAGNOSIS — Z5111 Encounter for antineoplastic chemotherapy: Secondary | ICD-10-CM | POA: Insufficient documentation

## 2020-01-18 DIAGNOSIS — G629 Polyneuropathy, unspecified: Secondary | ICD-10-CM | POA: Insufficient documentation

## 2020-01-18 DIAGNOSIS — C801 Malignant (primary) neoplasm, unspecified: Secondary | ICD-10-CM

## 2020-01-18 DIAGNOSIS — Z95828 Presence of other vascular implants and grafts: Secondary | ICD-10-CM

## 2020-01-18 LAB — CMP (CANCER CENTER ONLY)
ALT: 37 U/L (ref 0–44)
AST: 39 U/L (ref 15–41)
Albumin: 3.3 g/dL — ABNORMAL LOW (ref 3.5–5.0)
Alkaline Phosphatase: 99 U/L (ref 38–126)
Anion gap: 10 (ref 5–15)
BUN: 16 mg/dL (ref 8–23)
CO2: 26 mmol/L (ref 22–32)
Calcium: 8.6 mg/dL — ABNORMAL LOW (ref 8.9–10.3)
Chloride: 106 mmol/L (ref 98–111)
Creatinine: 0.99 mg/dL (ref 0.44–1.00)
GFR, Est AFR Am: >60 mL/min (ref >60)
GFR, Estimated: 53 mL/min — ABNORMAL LOW (ref >60)
Glucose, Bld: 107 mg/dL — ABNORMAL HIGH (ref 70–99)
Potassium: 4.1 mmol/L (ref 3.5–5.1)
Sodium: 142 mmol/L (ref 135–145)
Total Bilirubin: 0.7 mg/dL (ref 0.3–1.2)
Total Protein: 6.7 g/dL (ref 6.5–8.1)

## 2020-01-18 LAB — CBC WITH DIFFERENTIAL (CANCER CENTER ONLY)
Abs Immature Granulocytes: 0.04 10*3/uL (ref 0.00–0.07)
Basophils Absolute: 0 10*3/uL (ref 0.0–0.1)
Basophils Relative: 0 %
Eosinophils Absolute: 0 10*3/uL (ref 0.0–0.5)
Eosinophils Relative: 1 %
HCT: 32.5 % — ABNORMAL LOW (ref 36.0–46.0)
Hemoglobin: 10.3 g/dL — ABNORMAL LOW (ref 12.0–15.0)
Immature Granulocytes: 1 %
Lymphocytes Relative: 32 %
Lymphs Abs: 1.6 10*3/uL (ref 0.7–4.0)
MCH: 31.3 pg (ref 26.0–34.0)
MCHC: 31.7 g/dL (ref 30.0–36.0)
MCV: 98.8 fL (ref 80.0–100.0)
Monocytes Absolute: 0.4 10*3/uL (ref 0.1–1.0)
Monocytes Relative: 9 %
Neutro Abs: 2.8 10*3/uL (ref 1.7–7.7)
Neutrophils Relative %: 57 %
Platelet Count: 281 10*3/uL (ref 150–400)
RBC: 3.29 MIL/uL — ABNORMAL LOW (ref 3.87–5.11)
RDW: 16.7 % — ABNORMAL HIGH (ref 11.5–15.5)
WBC Count: 4.9 10*3/uL (ref 4.0–10.5)
nRBC: 0 % (ref 0.0–0.2)

## 2020-01-18 MED ORDER — PROCHLORPERAZINE MALEATE 10 MG PO TABS
10.0000 mg | ORAL_TABLET | Freq: Once | ORAL | Status: AC
  Administered 2020-01-18: 10 mg via ORAL

## 2020-01-18 MED ORDER — PACLITAXEL PROTEIN-BOUND CHEMO INJECTION 100 MG
100.0000 mg/m2 | Freq: Once | INTRAVENOUS | Status: AC
  Administered 2020-01-18: 200 mg via INTRAVENOUS
  Filled 2020-01-18: qty 40

## 2020-01-18 MED ORDER — SODIUM CHLORIDE 0.9 % IV SOLN
Freq: Once | INTRAVENOUS | Status: AC
  Filled 2020-01-18: qty 250

## 2020-01-18 MED ORDER — PROCHLORPERAZINE MALEATE 10 MG PO TABS
ORAL_TABLET | ORAL | Status: AC
  Filled 2020-01-18: qty 1

## 2020-01-18 MED ORDER — SODIUM CHLORIDE 0.9% FLUSH
10.0000 mL | Freq: Once | INTRAVENOUS | Status: AC
  Administered 2020-01-18: 10 mL
  Filled 2020-01-18: qty 10

## 2020-01-18 MED ORDER — SODIUM CHLORIDE 0.9% FLUSH
10.0000 mL | INTRAVENOUS | Status: DC | PRN
  Administered 2020-01-18: 10 mL
  Filled 2020-01-18: qty 10

## 2020-01-18 MED ORDER — SODIUM CHLORIDE 0.9 % IV SOLN
800.0000 mg/m2 | Freq: Once | INTRAVENOUS | Status: AC
  Administered 2020-01-18: 1558 mg via INTRAVENOUS
  Filled 2020-01-18: qty 40.98

## 2020-01-18 MED ORDER — HEPARIN SOD (PORK) LOCK FLUSH 100 UNIT/ML IV SOLN
500.0000 [IU] | Freq: Once | INTRAVENOUS | Status: AC | PRN
  Administered 2020-01-18: 500 [IU]
  Filled 2020-01-18: qty 5

## 2020-01-18 NOTE — Patient Instructions (Signed)
[facility] Discharge Instructions for Patients Receiving Chemotherapy  Today you received the following chemotherapy agents: Abraxane/Gemzar.  To help prevent nausea and vomiting after your treatment, we encourage you to take your nausea medication as directed.   If you develop nausea and vomiting that is not controlled by your nausea medication, call the clinic.   BELOW ARE SYMPTOMS THAT SHOULD BE REPORTED IMMEDIATELY:  *FEVER GREATER THAN 100.5 F  *CHILLS WITH OR WITHOUT FEVER  NAUSEA AND VOMITING THAT IS NOT CONTROLLED WITH YOUR NAUSEA MEDICATION  *UNUSUAL SHORTNESS OF BREATH  *UNUSUAL BRUISING OR BLEEDING  TENDERNESS IN MOUTH AND THROAT WITH OR WITHOUT PRESENCE OF ULCERS  *URINARY PROBLEMS  *BOWEL PROBLEMS  UNUSUAL RASH Items with * indicate a potential emergency and should be followed up as soon as possible.  Feel free to call the clinic should you have any questions or concerns. The clinic phone number is [phone].  Please show the CHEMO ALERT CARD at check-in to the Emergency Department and triage nurse.   

## 2020-01-18 NOTE — Progress Notes (Signed)
OK to treat.  Ileane Sando, MHS, PA-C 

## 2020-01-19 LAB — CANCER ANTIGEN 19-9: CA 19-9: 2271 U/mL — ABNORMAL HIGH (ref 0–35)

## 2020-01-22 NOTE — Progress Notes (Signed)
[facility] OFFICE PROGRESS NOTE   Diagnosis: Pancreas cancer  INTERVAL HISTORY:   Kaylee Mullins is a 84 y.o. female with a diagnosis of an ampullary carcinoma who presents today for cycle 6, day 1 of gemcitabine. She has stopped her Lasix and Cozaar since her last visit as directed by Dr. Benay Spice.  Her BP today is 189/75 and she has had progression of lower extremity edema. Her left lower extremity is larger than her right. She reports having had a negative left lower extremity ultrasound completed. She is otherwise doing well with no acute issues of concern.  Subjective:  Review of Systems  Constitutional: Negative for chills, diaphoresis, fever and malaise/fatigue.  HENT: Negative for sore throat.   Respiratory: Negative for cough, hemoptysis, sputum production, shortness of breath and wheezing.   Cardiovascular: Positive for leg swelling. Negative for chest pain, palpitations and claudication.  Gastrointestinal: Negative for constipation, diarrhea, nausea and vomiting.  Genitourinary: Negative for dysuria, frequency and urgency.  Musculoskeletal: Negative for back pain and myalgias.  Skin: Negative for itching and rash.  Neurological: Negative for dizziness and headaches.  Psychiatric/Behavioral: Negative for depression. The patient is not nervous/anxious.    Objective:  Vital signs in last 24 hours:  Blood pressure 140/80, pulse 63, temperature 97.8 F (36.6 C), temperature source Oral, resp. rate 20, height 5\' 4"  (1.626 m), weight 83.6 kg (184 lb 4.8 oz), SpO2 100 %.   Physical Exam  Constitutional: No distress.  HENT:  Head: Normocephalic and atraumatic.  Eyes: Right eye exhibits no discharge. Left eye exhibits no discharge. No scleral icterus.  Cardiovascular: Normal rate, regular rhythm and normal heart sounds. Exam reveals no gallop and no friction rub.  No murmur heard. Pulmonary/Chest: Effort normal and breath sounds normal. No respiratory distress.  She has no wheezes. She has no rales.  Musculoskeletal:        General: Edema present.     Cervical back: Normal range of motion and neck supple.     Comments: 2+ bilateral lower extremity edema to the knees greater on the left than right.  Lymphadenopathy:    She has no cervical adenopathy.  Neurological: Coordination abnormal.  The patient is ambulating with the use of a wheelchair.  Skin: Skin is warm and dry. No rash noted. She is not diaphoretic. No erythema.  Psychiatric: She has a normal mood and affect. Her behavior is normal. Judgment and thought content normal.   Portacath/PICC-without erythema  Lab Results:  Lab Results  Component Value Date   WBC 4.9 01/18/2020   HGB 10.3 (L) 01/18/2020   HCT 32.5 (L) 01/18/2020   MCV 98.8 01/18/2020   PLT 281 01/18/2020   NEUTROABS 2.8 01/18/2020    CMP  Lab Results  Component Value Date   NA 142 01/18/2020   K 4.1 01/18/2020   CL 106 01/18/2020   CO2 26 01/18/2020   GLUCOSE 107 (H) 01/18/2020   BUN 16 01/18/2020   CREATININE 0.99 01/18/2020   CALCIUM 8.6 (L) 01/18/2020   PROT 6.7 01/18/2020   ALBUMIN 3.3 (L) 01/18/2020   AST 39 01/18/2020   ALT 37 01/18/2020   ALKPHOS 99 01/18/2020   BILITOT 0.7 01/18/2020   GFRNONAA 53 (L) 01/18/2020   GFRAA >60 01/18/2020  CA 19-9 on 12/22/2019: 3355  Medications: I have reviewed the patient's current medications.   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 CT abdomen/pelvis 10/23/2016-ampullary mass,  single mildly enlarged porta hepatis lymph node, no evidence of distant metastatic disease ? Status post pancreaticoduodenectomy 01/12/2017; pT3pN2 ? CT abdomen/pelvis 03/30/2018-ablation defect within the upper pole of the right kidney. No evidence of recurrent renal mass. New mild left periaortic retroperitoneal lymphadenopathy. ? CT abdomen/pelvis 06/30/2018-6 mm right middle  lobe nodule-slowly growing over multiple CTs, stable ablation defect in the right kidney, 1.9 cm left periaortic node increased from 1.2 cm, stable 1.5 cm porta hepatis node ? CT abdomen/pelvis 09/17/2018-surgical changes related to Whipple procedure. Stable upper abdominal/retroperitoneal lymphadenopathy. 5 mm right middle lobe nodule, stable from most recent CT but mildly progressive from priors. Postprocedural changes related to renal ablation in the medial right upper kidney. ? CTs 03/24/2019-a few scattered pulmonary nodules appears stable; liver has a slightly shrunken appearance and nodular appearance compatible with mild cirrhosis. No suspicious cystic or solid hepatic lesions. Upper abdominal and retroperitoneal lymphadenopathy appears similar compared to the prior study. No other new lymphadenopathy noted elsewhere in the abdomen or pelvis. ? PET scan 07/03/2019-hypermetabolic lymph node adjacent porta hepatis. Newfocal hypermetabolic lesion in the liver. Hypermetabolic activity in the left suprarenal location favored metastatic adenopathy in the retroperitoneum. Diffuse hypermetabolic activity within the stomach favor gastritis. ? Cycle 1 gemcitabine/Abraxane 08/31/2019 ? Cycle 2 gemcitabine/Abraxane 09/14/2019 ? Cycle 3 gemcitabine/Abraxane 09/29/2019 ? Cycle 4 gemcitabine/Abraxane 10/13/2019 ? Cycle 5 gemcitabine/Abraxane 10/27/2019 ? Cycle 6 gemcitabine/Abraxane 11/09/2019 ? CTs 11/13/2019-new 2 mm right apical and 3 mm left lower lobe nodules, stable portacaval node, enlargement of amorphous soft tissue encasing the left renal artery, stable liver lesion ? Cycle 7 gemcitabine/Abraxane 11/24/2019 ? Cycle 8 gemcitabine/Abraxane 12/07/2019 ? 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 Cycle 10 gemcitabine/Abraxane 01/05/2020 ? Cycle 11 gemcitabine/Abraxane 01/18/2020 2. Biliary obstruction secondary to #1, status post placement of a metal, bile duct stent on  10/23/2016  3. 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 Renal cell carcinoma-status post ablation of a right renal mass 07/15/2016, biopsy confirmed papillary renal cell carcinoma, Fuhrman grade 3  5. CVA in 2016  6. History of thyroid cancer-status post thyroidectomy and radioactive iodine in 2005  7. Diabetes  8.Hypertension  9.Left lower extremity deep vein thrombosis June 2019, IVC filter placed July 2018 after she was diagnosed with a rectus hematoma while on Lovenox  10. Hospitalized 04/12/2019 through 04/14/2019 with traumatic hematoma right lower leg, followed at the wound clinic  11.Port-A-Cath placement interventional radiology 08/29/2019  12.   Covid vaccine-second injection administered 11/10/2019     Disposition: Ms. Cade presents to the clinic today for ongoing treatment with gemcitabine/Abraxane.  We will proceed with her treatment today.  She will have a restaging CT scan completed on 01/30/2020 and will return for follow-up on 02/01/2020.  She was told to restart Lasix 80 mg once daily for 3 days.  She can rePete this should her lower extremity edema recur.  She will take supplemental potassium while she is taking Lasix.   Harle Stanford, PA-C  01/22/2020  10:50 AM

## 2020-01-27 ENCOUNTER — Other Ambulatory Visit: Payer: Self-pay | Admitting: Oncology

## 2020-01-28 ENCOUNTER — Other Ambulatory Visit: Payer: Self-pay | Admitting: Oncology

## 2020-01-30 ENCOUNTER — Inpatient Hospital Stay: Payer: Medicare Other

## 2020-01-30 ENCOUNTER — Other Ambulatory Visit: Payer: Medicare Other

## 2020-01-30 ENCOUNTER — Other Ambulatory Visit: Payer: Self-pay

## 2020-01-30 ENCOUNTER — Ambulatory Visit (HOSPITAL_COMMUNITY)
Admission: RE | Admit: 2020-01-30 | Discharge: 2020-01-30 | Disposition: A | Discharge status: Home / Self Care | Payer: Medicare Other | Source: Ambulatory Visit | Attending: Oncology | Admitting: Oncology

## 2020-01-30 DIAGNOSIS — C241 Malignant neoplasm of ampulla of Vater: Secondary | ICD-10-CM | POA: Diagnosis present

## 2020-01-30 DIAGNOSIS — Z5111 Encounter for antineoplastic chemotherapy: Secondary | ICD-10-CM | POA: Diagnosis not present

## 2020-01-30 DIAGNOSIS — Z95828 Presence of other vascular implants and grafts: Secondary | ICD-10-CM

## 2020-01-30 DIAGNOSIS — C801 Malignant (primary) neoplasm, unspecified: Secondary | ICD-10-CM

## 2020-01-30 LAB — CBC WITH DIFFERENTIAL (CANCER CENTER ONLY)
Abs Immature Granulocytes: 0.03 10*3/uL (ref 0.00–0.07)
Basophils Absolute: 0 10*3/uL (ref 0.0–0.1)
Basophils Relative: 0 %
Eosinophils Absolute: 0 10*3/uL (ref 0.0–0.5)
Eosinophils Relative: 1 %
HCT: 31.1 % — ABNORMAL LOW (ref 36.0–46.0)
Hemoglobin: 9.9 g/dL — ABNORMAL LOW (ref 12.0–15.0)
Immature Granulocytes: 1 %
Lymphocytes Relative: 24 %
Lymphs Abs: 1.2 10*3/uL (ref 0.7–4.0)
MCH: 31 pg (ref 26.0–34.0)
MCHC: 31.8 g/dL (ref 30.0–36.0)
MCV: 97.5 fL (ref 80.0–100.0)
Monocytes Absolute: 0.5 10*3/uL (ref 0.1–1.0)
Monocytes Relative: 9 %
Neutro Abs: 3.3 10*3/uL (ref 1.7–7.7)
Neutrophils Relative %: 65 %
Platelet Count: 246 10*3/uL (ref 150–400)
RBC: 3.19 MIL/uL — ABNORMAL LOW (ref 3.87–5.11)
RDW: 16.5 % — ABNORMAL HIGH (ref 11.5–15.5)
WBC Count: 5.1 10*3/uL (ref 4.0–10.5)
nRBC: 0 % (ref 0.0–0.2)

## 2020-01-30 LAB — CMP (CANCER CENTER ONLY)
ALT: 28 U/L (ref 0–44)
AST: 27 U/L (ref 15–41)
Albumin: 3.3 g/dL — ABNORMAL LOW (ref 3.5–5.0)
Alkaline Phosphatase: 93 U/L (ref 38–126)
Anion gap: 11 (ref 5–15)
BUN: 16 mg/dL (ref 8–23)
CO2: 25 mmol/L (ref 22–32)
Calcium: 8.6 mg/dL — ABNORMAL LOW (ref 8.9–10.3)
Chloride: 106 mmol/L (ref 98–111)
Creatinine: 0.87 mg/dL (ref 0.44–1.00)
GFR, Est AFR Am: >60 mL/min (ref >60)
GFR, Estimated: >60 mL/min (ref >60)
Glucose, Bld: 305 mg/dL — ABNORMAL HIGH (ref 70–99)
Potassium: 3.9 mmol/L (ref 3.5–5.1)
Sodium: 142 mmol/L (ref 135–145)
Total Bilirubin: 0.5 mg/dL (ref 0.3–1.2)
Total Protein: 6.9 g/dL (ref 6.5–8.1)

## 2020-01-30 MED ORDER — SODIUM CHLORIDE (PF) 0.9 % IJ SOLN
INTRAMUSCULAR | Status: AC
  Filled 2020-01-30: qty 50

## 2020-01-30 MED ORDER — HEPARIN SOD (PORK) LOCK FLUSH 100 UNIT/ML IV SOLN
INTRAVENOUS | Status: AC
  Filled 2020-01-30: qty 5

## 2020-01-30 MED ORDER — HEPARIN SOD (PORK) LOCK FLUSH 100 UNIT/ML IV SOLN
500.0000 [IU] | Freq: Once | INTRAVENOUS | Status: AC
  Administered 2020-01-30: 500 [IU] via INTRAVENOUS

## 2020-01-30 MED ORDER — SODIUM CHLORIDE 0.9% FLUSH
10.0000 mL | Freq: Once | INTRAVENOUS | Status: AC
  Administered 2020-01-30: 10 mL
  Filled 2020-01-30: qty 10

## 2020-01-30 MED ORDER — IOHEXOL 300 MG/ML  SOLN
100.0000 mL | Freq: Once | INTRAMUSCULAR | Status: AC | PRN
  Administered 2020-01-30: 100 mL via INTRAVENOUS

## 2020-01-30 NOTE — Patient Instructions (Signed)

## 2020-01-31 LAB — CANCER ANTIGEN 19-9: CA 19-9: 1904 U/mL — ABNORMAL HIGH (ref 0–35)

## 2020-02-01 ENCOUNTER — Other Ambulatory Visit: Payer: Self-pay

## 2020-02-01 ENCOUNTER — Encounter: Payer: Self-pay | Admitting: Nurse Practitioner

## 2020-02-01 ENCOUNTER — Inpatient Hospital Stay: Payer: Medicare Other

## 2020-02-01 ENCOUNTER — Telehealth: Payer: Self-pay

## 2020-02-01 ENCOUNTER — Inpatient Hospital Stay: Payer: Medicare Other | Admitting: Nurse Practitioner

## 2020-02-01 VITALS — BP 173/73 | HR 70 | Temp 97.8°F | Resp 17 | Ht 64.0 in | Wt 182.8 lb

## 2020-02-01 DIAGNOSIS — C241 Malignant neoplasm of ampulla of Vater: Secondary | ICD-10-CM

## 2020-02-01 DIAGNOSIS — Z5111 Encounter for antineoplastic chemotherapy: Secondary | ICD-10-CM | POA: Diagnosis not present

## 2020-02-01 MED ORDER — PROCHLORPERAZINE MALEATE 10 MG PO TABS
10.0000 mg | ORAL_TABLET | Freq: Once | ORAL | Status: AC
  Administered 2020-02-01: 10 mg via ORAL

## 2020-02-01 MED ORDER — SODIUM CHLORIDE 0.9 % IV SOLN
800.0000 mg/m2 | Freq: Once | INTRAVENOUS | Status: AC
  Administered 2020-02-01: 1558 mg via INTRAVENOUS
  Filled 2020-02-01: qty 40.98

## 2020-02-01 MED ORDER — PACLITAXEL PROTEIN-BOUND CHEMO INJECTION 100 MG
100.0000 mg/m2 | Freq: Once | INTRAVENOUS | Status: AC
  Administered 2020-02-01: 200 mg via INTRAVENOUS
  Filled 2020-02-01: qty 40

## 2020-02-01 MED ORDER — SODIUM CHLORIDE 0.9 % IV SOLN
Freq: Once | INTRAVENOUS | Status: AC
  Filled 2020-02-01: qty 250

## 2020-02-01 MED ORDER — HEPARIN SOD (PORK) LOCK FLUSH 100 UNIT/ML IV SOLN
500.0000 [IU] | Freq: Once | INTRAVENOUS | Status: AC | PRN
  Administered 2020-02-01: 500 [IU]
  Filled 2020-02-01: qty 5

## 2020-02-01 MED ORDER — SODIUM CHLORIDE 0.9% FLUSH
10.0000 mL | INTRAVENOUS | Status: DC | PRN
  Administered 2020-02-01: 10 mL
  Filled 2020-02-01: qty 10

## 2020-02-01 MED ORDER — PROCHLORPERAZINE MALEATE 10 MG PO TABS
ORAL_TABLET | ORAL | Status: AC
  Filled 2020-02-01: qty 1

## 2020-02-01 NOTE — Progress Notes (Addendum)
[facility] OFFICE PROGRESS NOTE   Diagnosis: Pancreas cancer  INTERVAL HISTORY:   Kaylee Mullins returns as scheduled.  She completed another cycle of gemcitabine/Abraxane 01/18/2020.  She had an episode of nausea/vomiting last week.  Diarrhea for 1 day.  No fever.  No rash.  Episode of left-sided abdominal pain after the CT scan.  Stable neuropathy symptoms in the toes.  Objective:  Vital signs in last 24 hours:  Blood pressure (!) 173/73, pulse 70, temperature 97.8 F (36.6 C), temperature source Temporal, resp. rate 17, height 5\' 4"  (1.626 m), weight 182 lb 12.8 oz (82.9 kg), SpO2 100 %.    HEENT: No thrush or ulcers. Resp: Lungs clear bilaterally. Cardio: Regular rate and rhythm. GI: Abdomen soft and nontender.  No hepatomegaly.  No mass. Vascular: Edema at the lower legs bilaterally left greater than right. Port-A-Cath without erythema.  Lab Results:  Lab Results  Component Value Date   WBC 5.1 01/30/2020   HGB 9.9 (L) 01/30/2020   HCT 31.1 (L) 01/30/2020   MCV 97.5 01/30/2020   PLT 246 01/30/2020   NEUTROABS 3.3 01/30/2020    Imaging:  CT CHEST W CONTRAST  Result Date: 01/30/2020 CLINICAL DATA:  Ampullary carcinoma restaging. Prior ablation of a right renal mass and 2017, biopsy confirmed papillary renal cell carcinoma. EXAM: CT CHEST, ABDOMEN, AND PELVIS WITH CONTRAST TECHNIQUE: Multidetector CT imaging of the chest, abdomen and pelvis was performed following the standard protocol during bolus administration of intravenous contrast. CONTRAST:  148mL OMNIPAQUE IOHEXOL 300 MG/ML  SOLN COMPARISON:  11/13/2019 FINDINGS: CT CHEST FINDINGS Cardiovascular: Right Port-A-Cath tip: Right atrium. Coronary, aortic arch, and branch vessel atherosclerotic vascular disease. Mild cardiomegaly. Mediastinum/Nodes: No pathologic thoracic adenopathy. Lungs/Pleura: Small right pleural effusion. Rounded peripheral mass along the margin of the right lower lobe posteriorly  adjacent to the pleural effusion measure 3.0 by 1.9 cm on image 65/7 with faint internal calcification, previously by my measurements this measured 3.0 by 1.9 cm. This abuts the visceral parietal pleura possibilities include metastatic lesion or rounded atelectasis. Stable 4 mm right upper lobe nodule, image 60/12. Stable 0.6 by 0.5 cm right middle lobe nodule, image 77/12. Mild atelectasis along the lung bases. Mild subpleural nodularity in the posterior basal segment left lower lobe on image 114/12 measuring 0.5 cm in diameter, stable. A 3 mm nodule in the superior segment left lower lobe on image 50/12 is stable. No new nodules are identified. Musculoskeletal: Moderately heterogeneous marrow noted along with some lucency in the T10 vertebral body very similar to prior, but not previously hypermetabolic. CT ABDOMEN PELVIS FINDINGS Hepatobiliary: Reduce conspicuity of margins of the moderately heterogeneous anterior liver lesion, which appears to span between the anterior right hepatic lobe and segment 4 of the liver, potentially measuring up to 5.2 by 2.5 by 4.4 cm (volume = 30 cm^3), and previously more sharply defined and measuring 3.0 by 2.2 by 3.4 cm (volume = 12 cm^3). Somewhat nodular contour of the liver, query early cirrhosis. Pneumobilia noted. There is indistinct hypodensity in portions of the right hepatic lobe which is nonspecific and could be from tumor or fatty sparing. A region of hypodensity in segment 5 measures 2.6 by 1.8 cm on image 101/7, previously 2.0 by 1.3 cm. The gallbladder is absent. Choledochoenterostomy noted containing gas and a small amount of accentuated density dependently which probably represents oral contrast medium. Pancreas: Partial pancreatectomy involving the pancreatic head. Otherwise unremarkable. Spleen: Unremarkable Adrenals/Urinary Tract: Postoperative findings from right kidney upper pole  partial nephrectomy with associated scarring/hypodensity not appreciably changed.  There some faint calcification in this region, likewise unchanged. The kidneys appear otherwise unremarkable. The adrenal glands appear normal. No urinary tract calculi are identified. Stomach/Bowel: Partial gastrectomy and gastrojejunostomy. Sigmoid colon diverticulosis. Normal appendix. Vascular/Lymphatic: Aortoiliac atherosclerotic vascular disease. Portacaval node 1.2 cm in short axis on image 87/7, previously 1.3 cm. Porta hepatis node 0.9 cm in short axis on image 85/7, formerly 1.3 cm. Abnormal indistinctly marginated soft tissue density probably reflecting nodal tissue surrounding the left proximal renal artery, measuring 1.8 cm in short axis on image 99/7, formerly 1.9 cm. Infrarenal IVC filter. Reproductive: Uterus absent.  Adnexa unremarkable. Other: No supplemental non-categorized findings. Musculoskeletal: Pelvic floor laxity. Small right groin hernia contains adipose tissue. Grade 1 degenerative anterolisthesis at L3-4. Interbody ray cages and facet fusion at L4-5. Multilevel lumbar foraminal impingement due to spondylosis and degenerative disc disease. IMPRESSION: 1. Mixed appearance, with the dominant liver lesion appearing larger but less well-defined; enlargement of a potential lesion inferiorly in the right hepatic lobe; with mildly reduced upper abdominal adenopathy; stable small pulmonary nodules; and stable peripheral masslike lesion along the posterior margin of the right lower lobe which could be from rounded atelectasis or malignancy. 2. Continued small right pleural effusion. 3. Mildly heterogeneous marrow but not previously hypermetabolic on PET-CT. 4. Somewhat nodular contour the liver, query cirrhosis. 5. Prior Whipple procedure. 6.  Aortic Atherosclerosis (ICD10-I70.0). 7. Pelvic floor laxity. 8. Multilevel lumbar impingement. 9. Electronically Signed   By: Van Clines M.D.   On: 01/30/2020 17:32   CT ABDOMEN PELVIS W CONTRAST  Result Date: 01/30/2020 CLINICAL DATA:   Ampullary carcinoma restaging. Prior ablation of a right renal mass and 2017, biopsy confirmed papillary renal cell carcinoma. EXAM: CT CHEST, ABDOMEN, AND PELVIS WITH CONTRAST TECHNIQUE: Multidetector CT imaging of the chest, abdomen and pelvis was performed following the standard protocol during bolus administration of intravenous contrast. CONTRAST:  169mL OMNIPAQUE IOHEXOL 300 MG/ML  SOLN COMPARISON:  11/13/2019 FINDINGS: CT CHEST FINDINGS Cardiovascular: Right Port-A-Cath tip: Right atrium. Coronary, aortic arch, and branch vessel atherosclerotic vascular disease. Mild cardiomegaly. Mediastinum/Nodes: No pathologic thoracic adenopathy. Lungs/Pleura: Small right pleural effusion. Rounded peripheral mass along the margin of the right lower lobe posteriorly adjacent to the pleural effusion measure 3.0 by 1.9 cm on image 65/7 with faint internal calcification, previously by my measurements this measured 3.0 by 1.9 cm. This abuts the visceral parietal pleura possibilities include metastatic lesion or rounded atelectasis. Stable 4 mm right upper lobe nodule, image 60/12. Stable 0.6 by 0.5 cm right middle lobe nodule, image 77/12. Mild atelectasis along the lung bases. Mild subpleural nodularity in the posterior basal segment left lower lobe on image 114/12 measuring 0.5 cm in diameter, stable. A 3 mm nodule in the superior segment left lower lobe on image 50/12 is stable. No new nodules are identified. Musculoskeletal: Moderately heterogeneous marrow noted along with some lucency in the T10 vertebral body very similar to prior, but not previously hypermetabolic. CT ABDOMEN PELVIS FINDINGS Hepatobiliary: Reduce conspicuity of margins of the moderately heterogeneous anterior liver lesion, which appears to span between the anterior right hepatic lobe and segment 4 of the liver, potentially measuring up to 5.2 by 2.5 by 4.4 cm (volume = 30 cm^3), and previously more sharply defined and measuring 3.0 by 2.2 by 3.4 cm  (volume = 12 cm^3). Somewhat nodular contour of the liver, query early cirrhosis. Pneumobilia noted. There is indistinct hypodensity in portions of the right hepatic lobe  which is nonspecific and could be from tumor or fatty sparing. A region of hypodensity in segment 5 measures 2.6 by 1.8 cm on image 101/7, previously 2.0 by 1.3 cm. The gallbladder is absent. Choledochoenterostomy noted containing gas and a small amount of accentuated density dependently which probably represents oral contrast medium. Pancreas: Partial pancreatectomy involving the pancreatic head. Otherwise unremarkable. Spleen: Unremarkable Adrenals/Urinary Tract: Postoperative findings from right kidney upper pole partial nephrectomy with associated scarring/hypodensity not appreciably changed. There some faint calcification in this region, likewise unchanged. The kidneys appear otherwise unremarkable. The adrenal glands appear normal. No urinary tract calculi are identified. Stomach/Bowel: Partial gastrectomy and gastrojejunostomy. Sigmoid colon diverticulosis. Normal appendix. Vascular/Lymphatic: Aortoiliac atherosclerotic vascular disease. Portacaval node 1.2 cm in short axis on image 87/7, previously 1.3 cm. Porta hepatis node 0.9 cm in short axis on image 85/7, formerly 1.3 cm. Abnormal indistinctly marginated soft tissue density probably reflecting nodal tissue surrounding the left proximal renal artery, measuring 1.8 cm in short axis on image 99/7, formerly 1.9 cm. Infrarenal IVC filter. Reproductive: Uterus absent.  Adnexa unremarkable. Other: No supplemental non-categorized findings. Musculoskeletal: Pelvic floor laxity. Small right groin hernia contains adipose tissue. Grade 1 degenerative anterolisthesis at L3-4. Interbody ray cages and facet fusion at L4-5. Multilevel lumbar foraminal impingement due to spondylosis and degenerative disc disease. IMPRESSION: 1. Mixed appearance, with the dominant liver lesion appearing larger but less  well-defined; enlargement of a potential lesion inferiorly in the right hepatic lobe; with mildly reduced upper abdominal adenopathy; stable small pulmonary nodules; and stable peripheral masslike lesion along the posterior margin of the right lower lobe which could be from rounded atelectasis or malignancy. 2. Continued small right pleural effusion. 3. Mildly heterogeneous marrow but not previously hypermetabolic on PET-CT. 4. Somewhat nodular contour the liver, query cirrhosis. 5. Prior Whipple procedure. 6.  Aortic Atherosclerosis (ICD10-I70.0). 7. Pelvic floor laxity. 8. Multilevel lumbar impingement. 9. Electronically Signed   By: Van Clines M.D.   On: 01/30/2020 17:32    Medications: I have reviewed the patient's current medications.  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 CT abdomen/pelvis 10/23/2016-ampullary mass, single mildly enlarged porta hepatis lymph node, no evidence of distant metastatic disease ? Status post pancreaticoduodenectomy 01/12/2017; pT3pN2 ? CT abdomen/pelvis 03/30/2018-ablation defect within the upper pole of the right kidney. No evidence of recurrent renal mass. New mild left periaortic retroperitoneal lymphadenopathy. ? CT abdomen/pelvis 06/30/2018-6 mm right middle lobe nodule-slowly growing over multiple CTs, stable ablation defect in the right kidney, 1.9 cm left periaortic node increased from 1.2 cm, stable 1.5 cm porta hepatis node ? CT abdomen/pelvis 09/17/2018-surgical changes related to Whipple procedure. Stable upper abdominal/retroperitoneal lymphadenopathy. 5 mm right middle lobe nodule, stable from most recent CT but mildly progressive from priors. Postprocedural changes related to renal ablation in the medial right upper kidney. ? CTs 03/24/2019-a few scattered pulmonary nodules appears stable; liver has a slightly shrunken appearance and  nodular appearance compatible with mild cirrhosis. No suspicious cystic or solid hepatic lesions. Upper abdominal and retroperitoneal lymphadenopathy appears similar compared to the prior study. No other new lymphadenopathy noted elsewhere in the abdomen or pelvis. ? PET scan 07/03/2019-hypermetabolic lymph node adjacent porta hepatis. Newfocal hypermetabolic lesion in the liver. Hypermetabolic activity in the left suprarenal location favored metastatic adenopathy in the retroperitoneum. Diffuse hypermetabolic activity within the stomach favor gastritis. ? Cycle 1 gemcitabine/Abraxane 08/31/2019 ? Cycle 2 gemcitabine/Abraxane 09/14/2019 ? Cycle 3 gemcitabine/Abraxane 09/29/2019 ?  Cycle 4 gemcitabine/Abraxane 10/13/2019 ? Cycle 5 gemcitabine/Abraxane 10/27/2019 ? Cycle 6 gemcitabine/Abraxane 11/09/2019 ? CTs 11/13/2019-new 2 mm right apical and 3 mm left lower lobe nodules, stable portacaval node, enlargement of amorphous soft tissue encasing the left renal artery, stable liver lesion ? Cycle 7 gemcitabine/Abraxane 11/24/2019 ? Cycle 8 gemcitabine/Abraxane 12/07/2019 ? 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 Cycle 10 gemcitabine/Abraxane 01/05/2020 ? Cycle 11 gemcitabine/Abraxane 01/18/2020 ? CTs 01/30/2020-small right pleural effusion.  Rounded peripheral mass along the margin of the right lower lobe posteriorly adjacent to the pleural effusion 3.0 x 1.9 cm compared to previous measurement of 3.0 x 1.9 cm.  Stable 4 mm right upper lobe nodule.  Stable 0.6 x 0.5 cm right middle lobe nodule.  Stable 3 mm nodule in the superior segment left lower lobe.  No new nodules identified.  Reduced conspicuity of margins of the moderately heterogeneous anterior liver lesion measuring 5.2 x 2.5 x 4.4 cm, previous measurement 3.0 x 2.4 x 3.4 cm.  Nodular contour of the liver.  Region of hypodensity in segment 5 measuring 2.6 x 1.8 cm, previously 2.0 x 1.3 cm.  Portacaval node 1.2 cm,  previously 1.3 cm.  Porta hepatis node 0.9 cm, formerly 1.3 cm.  Abnormal indistinctly marginated soft tissue density probably reflecting nodal tissue surrounding the left proximal renal artery measuring 1.8 cm, formerly 1.9 cm. ? Cycle 12 gemcitabine/Abraxane 02/01/2020 2. Biliary obstruction secondary to #1, status post placement of a metal, bile duct stent on 10/23/2016  3. 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 Renal cell carcinoma-status post ablation of a right renal mass 07/15/2016, biopsy confirmed papillary renal cell carcinoma, Fuhrman grade 3  5. CVA in 2016  6. History of thyroid cancer-status post thyroidectomy and radioactive iodine in 2005  7. Diabetes  8.Hypertension  9.Left lower extremity deep vein thrombosis June 2019, IVC filter placed July 2018 after she was diagnosed with a rectus hematoma while on Lovenox  10. Hospitalized 04/12/2019 through 04/14/2019 with traumatic hematoma right lower leg, followed at the wound clinic  11.Port-A-Cath placement interventional radiology 08/29/2019  12.Covid vaccine-second injection administered 11/10/2019   Disposition: Ms. Gulas appears stable.  She has completed 11 cycles of gemcitabine/Abraxane.  Clinically she continues to be improved.  Most recent tumor marker was again better.  Recent restaging CTs appear improved overall.  Images reviewed.  Significance of liver findings is unclear.  Plan to continue gemcitabine/Abraxane, cycle 12 today.  We reviewed the CBC and chemistry panel from 01/30/2020.  Labs adequate to proceed with treatment.  She has worsened lower extremity edema with greater than right.  She has a history of a left lower extremity DVT and is maintained on Coumadin.  We are referring her for bilateral venous Doppler studies.  She will return for lab, follow-up, gemcitabine/Abraxane in 2 weeks.  She will contact the office in the interim with any  problems.  Patient seen with Dr. Benay Spice.    [name] ANP/GNP-BC   02/01/2020  12:46 PM  This was a shared visit with [name].  Ms. Chopp was interviewed and examined.  She appears to be tolerating the chemotherapy well.  Neuropathy symptoms are stable.  We reviewed the restaging CT findings and images with her.  Abdominal lymph nodes and soft tissue in the retroperitoneum is unchanged.  It is unclear whether the liver "lesions "represent tumor.  I plan to review images further with a radiologist.  The plan is to continue gemcitabine/Abraxane.  The CA 19-9 continues to improve.  Kaylee Manson, MD

## 2020-02-01 NOTE — Telephone Encounter (Signed)
TC per [name] NP to schedule patient for Bilateral venous Doppler. Doppler scheduled for tomorrow morning 02/02/20@ 9:00am here at [facility]. Patient made aware of appointment date, time and location

## 2020-02-01 NOTE — Patient Instructions (Signed)
[facility] Discharge Instructions for Patients Receiving Chemotherapy  Today you received the following chemotherapy agents: Abraxane/Gemzar.  To help prevent nausea and vomiting after your treatment, we encourage you to take your nausea medication as directed.   If you develop nausea and vomiting that is not controlled by your nausea medication, call the clinic.   BELOW ARE SYMPTOMS THAT SHOULD BE REPORTED IMMEDIATELY:  *FEVER GREATER THAN 100.5 F  *CHILLS WITH OR WITHOUT FEVER  NAUSEA AND VOMITING THAT IS NOT CONTROLLED WITH YOUR NAUSEA MEDICATION  *UNUSUAL SHORTNESS OF BREATH  *UNUSUAL BRUISING OR BLEEDING  TENDERNESS IN MOUTH AND THROAT WITH OR WITHOUT PRESENCE OF ULCERS  *URINARY PROBLEMS  *BOWEL PROBLEMS  UNUSUAL RASH Items with * indicate a potential emergency and should be followed up as soon as possible.  Feel free to call the clinic should you have any questions or concerns. The clinic phone number is [phone].  Please show the CHEMO ALERT CARD at check-in to the Emergency Department and triage nurse.   

## 2020-02-02 ENCOUNTER — Ambulatory Visit (HOSPITAL_COMMUNITY)
Admission: RE | Admit: 2020-02-02 | Discharge: 2020-02-02 | Disposition: A | Discharge status: Home / Self Care | Payer: Medicare Other | Source: Ambulatory Visit | Attending: Nurse Practitioner | Admitting: Nurse Practitioner

## 2020-02-02 DIAGNOSIS — C241 Malignant neoplasm of ampulla of Vater: Secondary | ICD-10-CM | POA: Diagnosis not present

## 2020-02-02 NOTE — Progress Notes (Signed)
Bilateral lower extremity venous duplex completed. Refer to "CV Proc" under chart review to view preliminary results.  02/02/2020 9:36 AM Kelby Aline., MHA, RVT, RDCS, RDMS

## 2020-02-08 ENCOUNTER — Telehealth: Payer: Self-pay | Admitting: Nurse Practitioner

## 2020-02-08 NOTE — Telephone Encounter (Signed)
Called and left msg about 6/17 appt. Mailed printout

## 2020-02-09 ENCOUNTER — Other Ambulatory Visit: Payer: Self-pay

## 2020-02-09 ENCOUNTER — Telehealth: Payer: Self-pay | Admitting: *Deleted

## 2020-02-09 MED ORDER — FUROSEMIDE 80 MG PO TABS: ORAL_TABLET | ORAL | 0 refills | Status: DC

## 2020-02-09 NOTE — Telephone Encounter (Signed)
Called requesting refill on her lasix since feet are swelling a lot. Instructed her she needs to get this approved from her cardiologist, Dr. Harrington Challenger since she prescribe this. She agrees to call office to get an appointment.

## 2020-02-09 NOTE — Telephone Encounter (Signed)
Pt's medication was sent to pt's pharmacy as requested. Confirmation received.  °

## 2020-02-11 ENCOUNTER — Other Ambulatory Visit: Payer: Self-pay | Admitting: Oncology

## 2020-02-13 ENCOUNTER — Ambulatory Visit: Payer: Medicare Other | Admitting: Physician Assistant

## 2020-02-14 ENCOUNTER — Telehealth: Payer: Self-pay | Admitting: Oncology

## 2020-02-14 NOTE — Telephone Encounter (Signed)
Called pt back a second time .per vmail. Unable to reach . Left another message for pt to call back

## 2020-02-14 NOTE — Telephone Encounter (Signed)
Called pt per 6/30 sch message- no answer and no vmail for home or cell

## 2020-02-16 ENCOUNTER — Inpatient Hospital Stay: Payer: Medicare Other | Attending: Internal Medicine | Admitting: Nurse Practitioner

## 2020-02-16 ENCOUNTER — Other Ambulatory Visit: Payer: Self-pay

## 2020-02-16 ENCOUNTER — Inpatient Hospital Stay: Payer: Medicare Other

## 2020-02-16 ENCOUNTER — Encounter: Payer: Self-pay | Admitting: Nurse Practitioner

## 2020-02-16 VITALS — BP 151/64 | HR 63 | Temp 97.8°F | Resp 18 | Ht 64.0 in | Wt 184.0 lb

## 2020-02-16 DIAGNOSIS — Z5111 Encounter for antineoplastic chemotherapy: Secondary | ICD-10-CM | POA: Diagnosis not present

## 2020-02-16 DIAGNOSIS — Z85850 Personal history of malignant neoplasm of thyroid: Secondary | ICD-10-CM | POA: Diagnosis not present

## 2020-02-16 DIAGNOSIS — C241 Malignant neoplasm of ampulla of Vater: Secondary | ICD-10-CM | POA: Diagnosis present

## 2020-02-16 DIAGNOSIS — Z86718 Personal history of other venous thrombosis and embolism: Secondary | ICD-10-CM | POA: Diagnosis not present

## 2020-02-16 DIAGNOSIS — Z85528 Personal history of other malignant neoplasm of kidney: Secondary | ICD-10-CM | POA: Insufficient documentation

## 2020-02-16 DIAGNOSIS — K769 Liver disease, unspecified: Secondary | ICD-10-CM | POA: Diagnosis not present

## 2020-02-16 DIAGNOSIS — Z7901 Long term (current) use of anticoagulants: Secondary | ICD-10-CM | POA: Insufficient documentation

## 2020-02-16 DIAGNOSIS — E1140 Type 2 diabetes mellitus with diabetic neuropathy, unspecified: Secondary | ICD-10-CM | POA: Insufficient documentation

## 2020-02-16 DIAGNOSIS — Z9049 Acquired absence of other specified parts of digestive tract: Secondary | ICD-10-CM | POA: Insufficient documentation

## 2020-02-16 DIAGNOSIS — Z8673 Personal history of transient ischemic attack (TIA), and cerebral infarction without residual deficits: Secondary | ICD-10-CM | POA: Insufficient documentation

## 2020-02-16 DIAGNOSIS — J90 Pleural effusion, not elsewhere classified: Secondary | ICD-10-CM | POA: Insufficient documentation

## 2020-02-16 DIAGNOSIS — I10 Essential (primary) hypertension: Secondary | ICD-10-CM | POA: Diagnosis not present

## 2020-02-16 DIAGNOSIS — R1110 Vomiting, unspecified: Secondary | ICD-10-CM | POA: Diagnosis not present

## 2020-02-16 DIAGNOSIS — K862 Cyst of pancreas: Secondary | ICD-10-CM | POA: Diagnosis not present

## 2020-02-16 DIAGNOSIS — C801 Malignant (primary) neoplasm, unspecified: Secondary | ICD-10-CM

## 2020-02-16 DIAGNOSIS — E890 Postprocedural hypothyroidism: Secondary | ICD-10-CM | POA: Diagnosis not present

## 2020-02-16 DIAGNOSIS — Z95828 Presence of other vascular implants and grafts: Secondary | ICD-10-CM

## 2020-02-16 LAB — CBC WITH DIFFERENTIAL (CANCER CENTER ONLY)
Abs Immature Granulocytes: 0.03 10*3/uL (ref 0.00–0.07)
Basophils Absolute: 0 10*3/uL (ref 0.0–0.1)
Basophils Relative: 0 %
Eosinophils Absolute: 0.1 10*3/uL (ref 0.0–0.5)
Eosinophils Relative: 1 %
HCT: 32.2 % — ABNORMAL LOW (ref 36.0–46.0)
Hemoglobin: 10.1 g/dL — ABNORMAL LOW (ref 12.0–15.0)
Immature Granulocytes: 1 %
Lymphocytes Relative: 22 %
Lymphs Abs: 1.3 10*3/uL (ref 0.7–4.0)
MCH: 30.3 pg (ref 26.0–34.0)
MCHC: 31.4 g/dL (ref 30.0–36.0)
MCV: 96.7 fL (ref 80.0–100.0)
Monocytes Absolute: 0.5 10*3/uL (ref 0.1–1.0)
Monocytes Relative: 8 %
Neutro Abs: 4 10*3/uL (ref 1.7–7.7)
Neutrophils Relative %: 68 %
Platelet Count: 290 10*3/uL (ref 150–400)
RBC: 3.33 MIL/uL — ABNORMAL LOW (ref 3.87–5.11)
RDW: 17 % — ABNORMAL HIGH (ref 11.5–15.5)
WBC Count: 5.8 10*3/uL (ref 4.0–10.5)
nRBC: 0 % (ref 0.0–0.2)

## 2020-02-16 LAB — CMP (CANCER CENTER ONLY)
ALT: 24 U/L (ref 0–44)
AST: 26 U/L (ref 15–41)
Albumin: 3.3 g/dL — ABNORMAL LOW (ref 3.5–5.0)
Alkaline Phosphatase: 92 U/L (ref 38–126)
Anion gap: 8 (ref 5–15)
BUN: 22 mg/dL (ref 8–23)
CO2: 26 mmol/L (ref 22–32)
Calcium: 8.9 mg/dL (ref 8.9–10.3)
Chloride: 109 mmol/L (ref 98–111)
Creatinine: 0.85 mg/dL (ref 0.44–1.00)
GFR, Est AFR Am: >60 mL/min (ref >60)
GFR, Estimated: >60 mL/min (ref >60)
Glucose, Bld: 56 mg/dL — ABNORMAL LOW (ref 70–99)
Potassium: 3.8 mmol/L (ref 3.5–5.1)
Sodium: 143 mmol/L (ref 135–145)
Total Bilirubin: 0.6 mg/dL (ref 0.3–1.2)
Total Protein: 7.1 g/dL (ref 6.5–8.1)

## 2020-02-16 LAB — GLUCOSE, CAPILLARY: Glucose-Capillary: 98 mg/dL (ref 70–99)

## 2020-02-16 MED ORDER — SODIUM CHLORIDE 0.9% FLUSH
10.0000 mL | INTRAVENOUS | Status: DC | PRN
  Administered 2020-02-16: 10 mL
  Filled 2020-02-16: qty 10

## 2020-02-16 MED ORDER — SODIUM CHLORIDE 0.9% FLUSH
10.0000 mL | Freq: Once | INTRAVENOUS | Status: AC
  Administered 2020-02-16: 10 mL
  Filled 2020-02-16: qty 10

## 2020-02-16 MED ORDER — PROCHLORPERAZINE MALEATE 10 MG PO TABS
10.0000 mg | ORAL_TABLET | Freq: Once | ORAL | Status: AC
  Administered 2020-02-16: 10 mg via ORAL

## 2020-02-16 MED ORDER — HEPARIN SOD (PORK) LOCK FLUSH 100 UNIT/ML IV SOLN
500.0000 [IU] | Freq: Once | INTRAVENOUS | Status: AC | PRN
  Administered 2020-02-16: 500 [IU]
  Filled 2020-02-16: qty 5

## 2020-02-16 MED ORDER — PACLITAXEL PROTEIN-BOUND CHEMO INJECTION 100 MG
100.0000 mg/m2 | Freq: Once | INTRAVENOUS | Status: AC
  Administered 2020-02-16: 200 mg via INTRAVENOUS
  Filled 2020-02-16: qty 40

## 2020-02-16 MED ORDER — SODIUM CHLORIDE 0.9 % IV SOLN
800.0000 mg/m2 | Freq: Once | INTRAVENOUS | Status: AC
  Administered 2020-02-16: 1558 mg via INTRAVENOUS
  Filled 2020-02-16: qty 40.98

## 2020-02-16 MED ORDER — SODIUM CHLORIDE 0.9 % IV SOLN
Freq: Once | INTRAVENOUS | Status: AC
  Filled 2020-02-16: qty 250

## 2020-02-16 MED ORDER — PROCHLORPERAZINE MALEATE 10 MG PO TABS
ORAL_TABLET | ORAL | Status: AC
  Filled 2020-02-16: qty 1

## 2020-02-16 NOTE — Progress Notes (Signed)
[facility] OFFICE PROGRESS NOTE   Diagnosis: Ampullary cancer  INTERVAL HISTORY:   Kaylee Mullins returns as scheduled.  She completed another cycle of gemcitabine/Abraxane 02/01/2020.  She denies nausea/vomiting.  No diarrhea.  Stable numbness 3 toes on the right foot.  She describes pain as "pretty good".  She continues to have lower extremity edema.  She is taking Lasix 80 mg daily.  For about the past month she has noticed that the left lower leg feels slightly weaker than typical since her stroke when she tries to get into bed.  No back pain.  No bowel or bladder dysfunction.  Objective:  Vital signs in last 24 hours:  Blood pressure (!) 151/64, pulse 63, temperature 97.8 F (36.6 C), temperature source Temporal, resp. rate 18, height 5\' 4"  (1.626 m), weight 184 lb (83.5 kg), SpO2 100 %.    HEENT: No thrush or ulcers. Resp: Lungs clear bilaterally. Cardio: Regular rate and rhythm. GI: Abdomen soft and nontender.  No hepatomegaly. Vascular: Edema below the knees bilaterally left slightly greater than right. Neuro: Lower extremity strength appears intact on exam today. Port-A-Cath without erythema.  Lab Results:  Lab Results  Component Value Date   WBC 5.8 02/16/2020   HGB 10.1 (L) 02/16/2020   HCT 32.2 (L) 02/16/2020   MCV 96.7 02/16/2020   PLT 290 02/16/2020   NEUTROABS 4.0 02/16/2020    Imaging:  No results found.  Medications: I have reviewed the patient's current medications.  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 CT abdomen/pelvis 10/23/2016-ampullary mass, single mildly enlarged porta hepatis lymph node, no evidence of distant metastatic disease ? Status post pancreaticoduodenectomy 01/12/2017; pT3pN2 ? CT abdomen/pelvis 03/30/2018-ablation defect within the upper pole of the right kidney. No evidence of recurrent renal mass. New mild left  periaortic retroperitoneal lymphadenopathy. ? CT abdomen/pelvis 06/30/2018-6 mm right middle lobe nodule-slowly growing over multiple CTs, stable ablation defect in the right kidney, 1.9 cm left periaortic node increased from 1.2 cm, stable 1.5 cm porta hepatis node ? CT abdomen/pelvis 09/17/2018-surgical changes related to Whipple procedure. Stable upper abdominal/retroperitoneal lymphadenopathy. 5 mm right middle lobe nodule, stable from most recent CT but mildly progressive from priors. Postprocedural changes related to renal ablation in the medial right upper kidney. ? CTs 03/24/2019-a few scattered pulmonary nodules appears stable; liver has a slightly shrunken appearance and nodular appearance compatible with mild cirrhosis. No suspicious cystic or solid hepatic lesions. Upper abdominal and retroperitoneal lymphadenopathy appears similar compared to the prior study. No other new lymphadenopathy noted elsewhere in the abdomen or pelvis. ? PET scan 07/03/2019-hypermetabolic lymph node adjacent porta hepatis. Newfocal hypermetabolic lesion in the liver. Hypermetabolic activity in the left suprarenal location favored metastatic adenopathy in the retroperitoneum. Diffuse hypermetabolic activity within the stomach favor gastritis. ? Cycle 1 gemcitabine/Abraxane 08/31/2019 ? Cycle 2 gemcitabine/Abraxane 09/14/2019 ? Cycle 3 gemcitabine/Abraxane 09/29/2019 ? Cycle 4 gemcitabine/Abraxane 10/13/2019 ? Cycle 5 gemcitabine/Abraxane 10/27/2019 ? Cycle 6 gemcitabine/Abraxane 11/09/2019 ? CTs 11/13/2019-new 2 mm right apical and 3 mm left lower lobe nodules, stable portacaval node, enlargement of amorphous soft tissue encasing the left renal artery, stable liver lesion ? Cycle 7 gemcitabine/Abraxane 11/24/2019 ? Cycle 8 gemcitabine/Abraxane 12/07/2019 ? 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 Cycle 10 gemcitabine/Abraxane 01/05/2020 ? Cycle 11 gemcitabine/Abraxane 01/18/2020 ? CTs  01/30/2020-small right pleural effusion.  Rounded peripheral mass along the margin of the right lower lobe posteriorly  adjacent to the pleural effusion 3.0 x 1.9 cm compared to previous measurement of 3.0 x 1.9 cm.  Stable 4 mm right upper lobe nodule.  Stable 0.6 x 0.5 cm right middle lobe nodule.  Stable 3 mm nodule in the superior segment left lower lobe.  No new nodules identified.  Reduced conspicuity of margins of the moderately heterogeneous anterior liver lesion measuring 5.2 x 2.5 x 4.4 cm, previous measurement 3.0 x 2.4 x 3.4 cm.  Nodular contour of the liver.  Region of hypodensity in segment 5 measuring 2.6 x 1.8 cm, previously 2.0 x 1.3 cm.  Portacaval node 1.2 cm, previously 1.3 cm.  Porta hepatis node 0.9 cm, formerly 1.3 cm.  Abnormal indistinctly marginated soft tissue density probably reflecting nodal tissue surrounding the left proximal renal artery measuring 1.8 cm, formerly 1.9 cm. ? Cycle 12 gemcitabine/Abraxane 02/01/2020 ? Cycle 13 gemcitabine/Abraxane 02/16/2020 2. Biliary obstruction secondary to #1, status post placement of a metal, bile duct stent on 10/23/2016  3. 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 Renal cell carcinoma-status post ablation of a right renal mass 07/15/2016, biopsy confirmed papillary renal cell carcinoma, Fuhrman grade 3  5. CVA in 2016  6. History of thyroid cancer-status post thyroidectomy and radioactive iodine in 2005  7. Diabetes  8.Hypertension  9.Left lower extremity deep vein thrombosis June 2019, IVC filter placed July 2018 after she was diagnosed with a rectus hematoma while on Lovenox  10. Hospitalized 04/12/2019 through 04/14/2019 with traumatic hematoma right lower leg, followed at the wound clinic  11.Port-A-Cath placement interventional radiology 08/29/2019  12.Covid vaccine-second injection administered 11/10/2019   Disposition: Ms. Hoh appears unchanged.  There is no  clinical evidence of disease progression and the most recent CA 19-9 was further improved.  Plan to continue gemcitabine/Abraxane every 2 weeks, treatment today.  We reviewed the CBC and chemistry panel from today.  Labs adequate to proceed as above.  Of note, blood sugar returned at 56.  She was provided various foods and drink.  We will repeat prior to her leaving the office today.  She will return for lab, follow-up, gemcitabine/Abraxane in 2 weeks.  Plan reviewed with Dr. Benay Spice.  [name] ANP/GNP-BC   02/16/2020  12:47 PM

## 2020-02-16 NOTE — Patient Instructions (Signed)

## 2020-02-16 NOTE — Patient Instructions (Signed)
[facility] Discharge Instructions for Patients Receiving Chemotherapy  Today you received the following chemotherapy agents: Abraxane and Gemzar  To help prevent nausea and vomiting after your treatment, we encourage you to take your nausea medication as prescribed.    If you develop nausea and vomiting that is not controlled by your nausea medication, call the clinic.   BELOW ARE SYMPTOMS THAT SHOULD BE REPORTED IMMEDIATELY:  *FEVER GREATER THAN 100.5 F  *CHILLS WITH OR WITHOUT FEVER  NAUSEA AND VOMITING THAT IS NOT CONTROLLED WITH YOUR NAUSEA MEDICATION  *UNUSUAL SHORTNESS OF BREATH  *UNUSUAL BRUISING OR BLEEDING  TENDERNESS IN MOUTH AND THROAT WITH OR WITHOUT PRESENCE OF ULCERS  *URINARY PROBLEMS  *BOWEL PROBLEMS  UNUSUAL RASH Items with * indicate a potential emergency and should be followed up as soon as possible.  Feel free to call the clinic should you have any questions or concerns. The clinic phone number is [phone].  Please show the CHEMO ALERT CARD at check-in to the Emergency Department and triage nurse.   

## 2020-02-17 LAB — CANCER ANTIGEN 19-9: CA 19-9: 1798 U/mL — ABNORMAL HIGH (ref 0–35)

## 2020-02-21 ENCOUNTER — Telehealth: Payer: Self-pay | Admitting: Nurse Practitioner

## 2020-02-21 NOTE — Telephone Encounter (Signed)
Scheduled per 7/2 los. Called and spoke with pt, confirmed 7/15 appts

## 2020-02-24 ENCOUNTER — Other Ambulatory Visit: Payer: Self-pay | Admitting: Oncology

## 2020-02-29 ENCOUNTER — Inpatient Hospital Stay (HOSPITAL_BASED_OUTPATIENT_CLINIC_OR_DEPARTMENT_OTHER): Payer: Medicare Other | Admitting: Nurse Practitioner

## 2020-02-29 ENCOUNTER — Inpatient Hospital Stay: Payer: Medicare Other

## 2020-02-29 ENCOUNTER — Other Ambulatory Visit: Payer: Self-pay

## 2020-02-29 ENCOUNTER — Encounter: Payer: Self-pay | Admitting: Nurse Practitioner

## 2020-02-29 VITALS — BP 120/60 | HR 57 | Temp 97.7°F | Resp 16 | Ht 64.0 in | Wt 177.9 lb

## 2020-02-29 DIAGNOSIS — C241 Malignant neoplasm of ampulla of Vater: Secondary | ICD-10-CM

## 2020-02-29 DIAGNOSIS — Z5111 Encounter for antineoplastic chemotherapy: Secondary | ICD-10-CM | POA: Diagnosis not present

## 2020-02-29 DIAGNOSIS — Z95828 Presence of other vascular implants and grafts: Secondary | ICD-10-CM

## 2020-02-29 DIAGNOSIS — C801 Malignant (primary) neoplasm, unspecified: Secondary | ICD-10-CM

## 2020-02-29 LAB — CBC WITH DIFFERENTIAL (CANCER CENTER ONLY)
Abs Immature Granulocytes: 0.03 10*3/uL (ref 0.00–0.07)
Basophils Absolute: 0 10*3/uL (ref 0.0–0.1)
Basophils Relative: 0 %
Eosinophils Absolute: 0 10*3/uL (ref 0.0–0.5)
Eosinophils Relative: 1 %
HCT: 32.9 % — ABNORMAL LOW (ref 36.0–46.0)
Hemoglobin: 10.3 g/dL — ABNORMAL LOW (ref 12.0–15.0)
Immature Granulocytes: 1 %
Lymphocytes Relative: 21 %
Lymphs Abs: 1.2 10*3/uL (ref 0.7–4.0)
MCH: 30.7 pg (ref 26.0–34.0)
MCHC: 31.3 g/dL (ref 30.0–36.0)
MCV: 97.9 fL (ref 80.0–100.0)
Monocytes Absolute: 0.5 10*3/uL (ref 0.1–1.0)
Monocytes Relative: 9 %
Neutro Abs: 3.7 10*3/uL (ref 1.7–7.7)
Neutrophils Relative %: 68 %
Platelet Count: 218 10*3/uL (ref 150–400)
RBC: 3.36 MIL/uL — ABNORMAL LOW (ref 3.87–5.11)
RDW: 15.9 % — ABNORMAL HIGH (ref 11.5–15.5)
WBC Count: 5.4 10*3/uL (ref 4.0–10.5)
nRBC: 0 % (ref 0.0–0.2)

## 2020-02-29 LAB — CMP (CANCER CENTER ONLY)
ALT: 60 U/L — ABNORMAL HIGH (ref 0–44)
AST: 115 U/L — ABNORMAL HIGH (ref 15–41)
Albumin: 3.2 g/dL — ABNORMAL LOW (ref 3.5–5.0)
Alkaline Phosphatase: 84 U/L (ref 38–126)
Anion gap: 10 (ref 5–15)
BUN: 21 mg/dL (ref 8–23)
CO2: 25 mmol/L (ref 22–32)
Calcium: 8.5 mg/dL — ABNORMAL LOW (ref 8.9–10.3)
Chloride: 106 mmol/L (ref 98–111)
Creatinine: 0.96 mg/dL (ref 0.44–1.00)
GFR, Est AFR Am: >60 mL/min (ref >60)
GFR, Estimated: 55 mL/min — ABNORMAL LOW (ref >60)
Glucose, Bld: 182 mg/dL — ABNORMAL HIGH (ref 70–99)
Potassium: 4.3 mmol/L (ref 3.5–5.1)
Sodium: 141 mmol/L (ref 135–145)
Total Bilirubin: 0.8 mg/dL (ref 0.3–1.2)
Total Protein: 6.9 g/dL (ref 6.5–8.1)

## 2020-02-29 MED ORDER — SODIUM CHLORIDE 0.9 % IV SOLN
800.0000 mg/m2 | Freq: Once | INTRAVENOUS | Status: AC
  Administered 2020-02-29: 1558 mg via INTRAVENOUS
  Filled 2020-02-29: qty 40.98

## 2020-02-29 MED ORDER — PROCHLORPERAZINE MALEATE 10 MG PO TABS
10.0000 mg | ORAL_TABLET | Freq: Once | ORAL | Status: AC
  Administered 2020-02-29: 10 mg via ORAL

## 2020-02-29 MED ORDER — SODIUM CHLORIDE 0.9 % IV SOLN
Freq: Once | INTRAVENOUS | Status: AC
  Filled 2020-02-29: qty 250

## 2020-02-29 MED ORDER — PACLITAXEL PROTEIN-BOUND CHEMO INJECTION 100 MG
100.0000 mg/m2 | Freq: Once | INTRAVENOUS | Status: AC
  Administered 2020-02-29: 200 mg via INTRAVENOUS
  Filled 2020-02-29: qty 40

## 2020-02-29 MED ORDER — SODIUM CHLORIDE 0.9% FLUSH
10.0000 mL | Freq: Once | INTRAVENOUS | Status: AC
  Administered 2020-02-29: 10 mL
  Filled 2020-02-29: qty 10

## 2020-02-29 MED ORDER — HEPARIN SOD (PORK) LOCK FLUSH 100 UNIT/ML IV SOLN
500.0000 [IU] | Freq: Once | INTRAVENOUS | Status: AC | PRN
  Administered 2020-02-29: 500 [IU]
  Filled 2020-02-29: qty 5

## 2020-02-29 MED ORDER — SODIUM CHLORIDE 0.9% FLUSH
10.0000 mL | INTRAVENOUS | Status: DC | PRN
  Administered 2020-02-29: 10 mL
  Filled 2020-02-29: qty 10

## 2020-02-29 MED ORDER — PROCHLORPERAZINE MALEATE 10 MG PO TABS
ORAL_TABLET | ORAL | Status: AC
  Filled 2020-02-29: qty 1

## 2020-02-29 NOTE — Patient Instructions (Signed)
[facility] Discharge Instructions for Patients Receiving Chemotherapy  Today you received the following chemotherapy agents: Abraxane and Gemzar  To help prevent nausea and vomiting after your treatment, we encourage you to take your nausea medication as prescribed.    If you develop nausea and vomiting that is not controlled by your nausea medication, call the clinic.   BELOW ARE SYMPTOMS THAT SHOULD BE REPORTED IMMEDIATELY:  *FEVER GREATER THAN 100.5 F  *CHILLS WITH OR WITHOUT FEVER  NAUSEA AND VOMITING THAT IS NOT CONTROLLED WITH YOUR NAUSEA MEDICATION  *UNUSUAL SHORTNESS OF BREATH  *UNUSUAL BRUISING OR BLEEDING  TENDERNESS IN MOUTH AND THROAT WITH OR WITHOUT PRESENCE OF ULCERS  *URINARY PROBLEMS  *BOWEL PROBLEMS  UNUSUAL RASH Items with * indicate a potential emergency and should be followed up as soon as possible.  Feel free to call the clinic should you have any questions or concerns. The clinic phone number is [phone].  Please show the CHEMO ALERT CARD at check-in to the Emergency Department and triage nurse.   

## 2020-02-29 NOTE — Progress Notes (Signed)
Ok to treat w/ AST 115 per [name] NP

## 2020-02-29 NOTE — Patient Instructions (Signed)

## 2020-02-29 NOTE — Progress Notes (Addendum)
[facility] OFFICE PROGRESS NOTE   Diagnosis: Ampullary cancer  INTERVAL HISTORY:   Kaylee Mullins returns as scheduled.  She completed another cycle of gemcitabine/Abraxane 02/16/2020.  She had an episode of vomiting 2 days ago.  No nausea or vomiting around the time of the most recent chemotherapy.  No mouth sores.  No diarrhea.  Stable neuropathy symptoms.  She continues to note weakness of the left leg.  She has intermittent tingling sensation left thigh to just below the knee.  Unchanged pain at the left back/flank region.  No bowel or bladder dysfunction.  Objective:  Vital signs in last 24 hours:  Blood pressure 120/60, pulse (!) 57, temperature 97.7 F (36.5 C), temperature source Temporal, resp. rate 16, height 5\' 4"  (1.626 m), weight 177 lb 14.4 oz (80.7 kg), SpO2 100 %.    HEENT: No thrush or ulcers. Resp: Lungs clear bilaterally. Cardio: Regular rate and rhythm. GI: Abdomen soft.  Mild tenderness left lower abdomen.  No hepatomegaly. Vascular: Edema both lower legs left greater than right. Neuro: Proximal weakness left leg.  Upper extremity strength intact. Port-A-Cath without erythema.  Lab Results:  Lab Results  Component Value Date   WBC 5.4 02/29/2020   HGB 10.3 (L) 02/29/2020   HCT 32.9 (L) 02/29/2020   MCV 97.9 02/29/2020   PLT 218 02/29/2020   NEUTROABS 3.7 02/29/2020    Imaging:  No results found.  Medications: I have reviewed the patient's current medications.  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 CT abdomen/pelvis 10/23/2016-ampullary mass, single mildly enlarged porta hepatis lymph node, no evidence of distant metastatic disease ? Status post pancreaticoduodenectomy 01/12/2017; pT3pN2 ? CT abdomen/pelvis 03/30/2018-ablation defect within the upper pole of the right kidney. No evidence of recurrent renal mass. New mild left  periaortic retroperitoneal lymphadenopathy. ? CT abdomen/pelvis 06/30/2018-6 mm right middle lobe nodule-slowly growing over multiple CTs, stable ablation defect in the right kidney, 1.9 cm left periaortic node increased from 1.2 cm, stable 1.5 cm porta hepatis node ? CT abdomen/pelvis 09/17/2018-surgical changes related to Whipple procedure. Stable upper abdominal/retroperitoneal lymphadenopathy. 5 mm right middle lobe nodule, stable from most recent CT but mildly progressive from priors. Postprocedural changes related to renal ablation in the medial right upper kidney. ? CTs 03/24/2019-a few scattered pulmonary nodules appears stable; liver has a slightly shrunken appearance and nodular appearance compatible with mild cirrhosis. No suspicious cystic or solid hepatic lesions. Upper abdominal and retroperitoneal lymphadenopathy appears similar compared to the prior study. No other new lymphadenopathy noted elsewhere in the abdomen or pelvis. ? PET scan 07/03/2019-hypermetabolic lymph node adjacent porta hepatis. Newfocal hypermetabolic lesion in the liver. Hypermetabolic activity in the left suprarenal location favored metastatic adenopathy in the retroperitoneum. Diffuse hypermetabolic activity within the stomach favor gastritis. ? Cycle 1 gemcitabine/Abraxane 08/31/2019 ? Cycle 2 gemcitabine/Abraxane 09/14/2019 ? Cycle 3 gemcitabine/Abraxane 09/29/2019 ? Cycle 4 gemcitabine/Abraxane 10/13/2019 ? Cycle 5 gemcitabine/Abraxane 10/27/2019 ? Cycle 6 gemcitabine/Abraxane 11/09/2019 ? CTs 11/13/2019-new 2 mm right apical and 3 mm left lower lobe nodules, stable portacaval node, enlargement of amorphous soft tissue encasing the left renal artery, stable liver lesion ? Cycle 7 gemcitabine/Abraxane 11/24/2019 ? Cycle 8 gemcitabine/Abraxane 12/07/2019 ? 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 Cycle 10 gemcitabine/Abraxane 01/05/2020 ? Cycle 11 gemcitabine/Abraxane 01/18/2020 ? CTs  01/30/2020-small right pleural effusion. Rounded peripheral mass along the margin of the right lower lobe posteriorly adjacent to the  pleural effusion 3.0 x 1.9 cm compared to previous measurement of 3.0 x 1.9 cm. Stable 4 mm right upper lobe nodule. Stable 0.6 x 0.5 cm right middle lobe nodule. Stable 3 mm nodule in the superior segment left lower lobe. No new nodules identified. Reduced conspicuity of margins of the moderately heterogeneous anterior liver lesion measuring 5.2 x 2.5 x 4.4 cm, previous measurement 3.0 x 2.4 x 3.4 cm. Nodular contour of the liver. Region of hypodensity in segment 5 measuring 2.6 x 1.8 cm, previously 2.0 x 1.3 cm. Portacaval node 1.2 cm, previously 1.3 cm. Porta hepatis node 0.9 cm, formerly 1.3 cm. Abnormal indistinctly marginated soft tissue density probably reflecting nodal tissue surrounding the left proximal renal artery measuring 1.8 cm, formerly 1.9 cm. ? Cycle 12 gemcitabine/Abraxane 02/01/2020 ? Cycle 13 gemcitabine/Abraxane 02/16/2020 ? Cycle 14 gemcitabine/Abraxane 02/29/2020 2. Biliary obstruction secondary to #1, status post placement of a metal, bile duct stent on 10/23/2016  3. 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 Renal cell carcinoma-status post ablation of a right renal mass 07/15/2016, biopsy confirmed papillary renal cell carcinoma, Fuhrman grade 3  5. CVA in 2016  6. History of thyroid cancer-status post thyroidectomy and radioactive iodine in 2005  7. Diabetes  8.Hypertension  9.Left lower extremity deep vein thrombosis June 2019, IVC filter placed July 2018 after she was diagnosed with a rectus hematoma while on Lovenox  10. Hospitalized 04/12/2019 through 04/14/2019 with traumatic hematoma right lower leg, followed at the wound clinic  11.Port-A-Cath placement interventional radiology 08/29/2019  12.Covid vaccine-second injection administered 11/10/2019   Disposition:  Kaylee Mullins appears unchanged.  There is no clinical evidence of disease progression.  Plan to proceed with gemcitabine/Abraxane today as scheduled.  We will follow-up on the CA 19-9 from today.  We reviewed the CBC and chemistry panel from today.  Labs adequate to proceed with treatment.  AST and ALT newly elevated, likely treatment related.  We will continue to monitor.  She has left leg weakness residual from CVA in 2016.  She feels like the weakness has worsened.  We are making a referral to physical therapy.  She will return for lab, follow-up, chemotherapy in 2 weeks.  Patient seen with Dr. Benay Spice.    [name] ANP/GNP-BC   02/29/2020  1:11 PM  This was a shared visit with [name].  Ms. Bibbins was interviewed and examined.  She has increased left leg weakness limiting her ability to ambulate.  I suspect the leg weakness is related to deconditioning and the remote CVA.  The weakness is proximal.  We will make a physical therapy referral.  There is no clinical evidence for progression of the ampullary cancer.  [name], MD

## 2020-03-01 ENCOUNTER — Telehealth: Payer: Self-pay | Admitting: Nurse Practitioner

## 2020-03-01 LAB — CANCER ANTIGEN 19-9: CA 19-9: 1797 U/mL — ABNORMAL HIGH (ref 0–35)

## 2020-03-01 NOTE — Telephone Encounter (Signed)
Scheduled per 7/15 los. Called and spoke with pt, confirmed 8/13 appts

## 2020-03-03 ENCOUNTER — Other Ambulatory Visit: Payer: Self-pay | Admitting: Internal Medicine

## 2020-03-04 ENCOUNTER — Other Ambulatory Visit: Payer: Self-pay

## 2020-03-04 NOTE — Progress Notes (Signed)
Paperwork sent for pt referral to [place] outpatient orthopedic rehab at [facility] fax # [phone]

## 2020-03-10 ENCOUNTER — Other Ambulatory Visit: Payer: Self-pay | Admitting: Oncology

## 2020-03-14 ENCOUNTER — Telehealth: Payer: Self-pay | Admitting: Oncology

## 2020-03-14 ENCOUNTER — Inpatient Hospital Stay: Payer: Medicare Other

## 2020-03-14 ENCOUNTER — Inpatient Hospital Stay: Payer: Medicare Other | Admitting: Oncology

## 2020-03-14 ENCOUNTER — Telehealth: Payer: Self-pay | Admitting: *Deleted

## 2020-03-14 MED ORDER — MECLIZINE HCL 50 MG PO TABS: 25.0000 mg | ORAL_TABLET | Freq: Three times a day (TID) | ORAL | 0 refills | Status: DC | PRN

## 2020-03-14 NOTE — Telephone Encounter (Signed)
Not able to come in today for treatment. Developed vertigo last night and persists today. Is taking OTC motion sickness pill. Has had this in past and lasted 2-3 days. She agrees to reschedule for tomorrow and will call if not better. MD approved script for meclizine and she was informed this was sent to her pharmacy. High priority scheduling message sent.

## 2020-03-14 NOTE — Telephone Encounter (Signed)
R/s appt per 7/29 sch msg - pt is aware of new appts.

## 2020-03-15 ENCOUNTER — Inpatient Hospital Stay: Payer: Medicare Other

## 2020-03-15 ENCOUNTER — Telehealth: Payer: Self-pay | Admitting: *Deleted

## 2020-03-15 ENCOUNTER — Inpatient Hospital Stay: Payer: Medicare Other | Admitting: Oncology

## 2020-03-15 NOTE — Telephone Encounter (Signed)
Still has vertigo and wants to reschedule for next week. Scheduling message sent.

## 2020-03-18 ENCOUNTER — Telehealth: Payer: Self-pay | Admitting: Nurse Practitioner

## 2020-03-18 NOTE — Telephone Encounter (Signed)
Scheduled appt per 7/30 sch msg - pt is aware of appt on 8/5

## 2020-03-19 NOTE — Progress Notes (Deleted)
Cardiology Office Note:    Date:  03/19/2020   ID:  Kaylee Mullins, DOB November 24, 1935, MRN [id number]  PCP:  Nolene Ebbs, MD  Cardiologist:  Dorris Carnes, MD *** Electrophysiologist:  None   Referring MD: Nolene Ebbs, MD   Chief Complaint:  No chief complaint on file.    Patient Profile:    Kaylee Mullins is a 84 y.o. female with:   SVT  Diabetes mellitus   Chronic kidney disease   Diastolic CHF  Prior CVA   Hypertension   Hyperlipidemia   Renal cell CA s/p ablation  Thyroid CA   Ampullary CA s/p Whipple in 5/18  Course c/b SVT, DVT, rectus sheath hematoma >> anticoag DC'd >> IVC filter  DVT  Admx 12/18 w/ extensive bilat DVTs (CT neg for PE) >> Anticoag restarted due to burden of DVT  Coumadin Rx  Prior CV studies: Echo 06/28/17 Mild concentric LVH, EF 60-65, normal wall motion, grade 1 diastolic dysfunction, mild AI, mild LAE, normal RV SF  Nuclear stress test 6/17 Inferior/inferolateral infarct with mild peri-infarct ischemia versus diaphragmatic attenuation, no significant ischemia, EF 56, low risk  Echo 6/17 Mild concentric LVH, EF 55-60, normal wall motion, grade 2 diastolic dysfunction, trivial AI  Carotid US 1/16 Bilateral ICA 1-39  History of Present Illness:    Ms. Cammon was last seen by Dr. Harrington Challenger in 2019.  She returns for follow up.  The DICTATELATER SmartLink is not supported in this context. ***   Past Medical History:  Diagnosis Date  . Acute CVA (cerebrovascular accident) ([place]) 09/16/2014  . Acute DVT of left tibial vein ([place]) 04/03/2017  . Adenocarcinoma ([place]) 01/12/2017  . Ampullary carcinoma ([place]) 10/27/2016  . Anxiety   . Arthritis    knees  . Black tarry stools    05-14-16 negative for occult blood with ER visit- noted in [employer].  . Cancer [facility])    thyroid cancer- surgery and radiation  . Cerebral infarction due to unspecified mechanism   . Chronic kidney disease    questionable mass on kidney. Being followed by Dr  Diona Fanti  . Complication of anesthesia    heart rate was really low  . CVA (cerebral infarction) 09/11/2014  . Depression   . Diabetes mellitus    type 2  . Diabetes mellitus without complication ([place]) 6/0/7371   Qualifier: Diagnosis of  By: Loanne Drilling MD, Jacelyn Pi   . Dyslipidemia 04/20/2007   Qualifier: Diagnosis of  By: Loanne Drilling MD, Jacelyn Pi   . Full dentures   . Gastroparesis 02/06/2017  . GERD (gastroesophageal reflux disease) 04/03/2017  . History of CVA (cerebral vascular accident) ([place]) 09/11/2014  . Hypertension   . Hypokalemia 08/25/2017  . Hypothyroidism   . Lumbar stenosis with neurogenic claudication 02/10/2016  . Osteoarthritis 09/11/2014  . Pneumonia   . Right renal mass 09/13/2014  . Spinal stenosis   . Stroke ([place]) 09/2014   left sided weakness  . SVT (supraventricular tachycardia) ([place]) 07/30/2017    Current Medications: No outpatient medications have been marked as taking for the 03/20/20 encounter (Appointment) with Richardson Dopp T, PA-C.     Allergies:   Hydralazine hcl, Amlodipine besylate, Darvon [propoxyphene hcl], Nyquil multi-symptom [pseudoeph-doxylamine-dm-apap], and Metformin and related   Social History   Tobacco Use  . Smoking status: Never Smoker  . Smokeless tobacco: Never Used  Vaping Use  . Vaping Use: Never used  Substance Use Topics  . Alcohol use: No  . Drug use: Yes  Types: Methamphetamines     Family Hx: The patient's family history includes Heart attack in her father; Heart failure in her mother.  ROS   EKGs/Labs/Other Test Reviewed:    EKG:  EKG is *** ordered today.  The ekg ordered today demonstrates ***  Recent Labs: 02/29/2020: ALT 60; BUN 21; Creatinine 0.96; Hemoglobin 10.3; Platelet Count 218; Potassium 4.3; Sodium 141   Recent Lipid Panel Lab Results  Component Value Date/Time   CHOL 120 04/09/2017 12:00 AM   TRIG 137 04/09/2017 12:00 AM   HDL 35 04/09/2017 12:00 AM   CHOLHDL 3.0 09/13/2014 09:10 AM   LDLCALC 58  04/09/2017 12:00 AM    Physical Exam:    VS:  There were no vitals taken for this visit.    Wt Readings from Last 3 Encounters:  02/29/20 177 lb 14.4 oz (80.7 kg)  02/16/20 184 lb (83.5 kg)  02/01/20 182 lb 12.8 oz (82.9 kg)     Physical Exam ***  ASSESSMENT & PLAN:    *** 1. SVT (supraventricular tachycardia) (HCC) No apparent recurrence.  Continue beta-blocker therapy.  2. Chronic deep vein thrombosis (DVT) of proximal vein of both lower extremities (HCC) Status post IVC filter.  She remains on Coumadin therapy which is followed in our Coumadin clinic.  I have encouraged her to continue to use compression stockings to help control her lower extremity edema.  She also takes furosemide.  Obtain follow-up basic metabolic panel today.  3. Hypertension, essential  The patient's blood pressure is controlled on her current regimen.  Continue current therapy.   4. Stage 3 chronic kidney disease ([place]) Obtain follow-up basic metabolic panel today.   Dispo:  No follow-ups on file.   Medication Adjustments/Labs and Tests Ordered: Current medicines are reviewed at length with the patient today.  Concerns regarding medicines are outlined above.  Tests Ordered: No orders of the defined types were placed in this encounter.  Medication Changes: No orders of the defined types were placed in this encounter.   Signed, Richardson Dopp, PA-C  03/19/2020 10:00 PM    [facility] [address], [place], [place]  [address] Phone: [phone]; Fax: [phone]

## 2020-03-20 ENCOUNTER — Ambulatory Visit: Payer: Medicare Other | Admitting: Physician Assistant

## 2020-03-20 DIAGNOSIS — I5032 Chronic diastolic (congestive) heart failure: Secondary | ICD-10-CM

## 2020-03-20 DIAGNOSIS — I471 Supraventricular tachycardia: Secondary | ICD-10-CM

## 2020-03-20 DIAGNOSIS — I825Z3 Chronic embolism and thrombosis of unspecified deep veins of distal lower extremity, bilateral: Secondary | ICD-10-CM

## 2020-03-20 DIAGNOSIS — I10 Essential (primary) hypertension: Secondary | ICD-10-CM

## 2020-03-21 ENCOUNTER — Other Ambulatory Visit: Payer: Self-pay

## 2020-03-21 ENCOUNTER — Inpatient Hospital Stay (HOSPITAL_BASED_OUTPATIENT_CLINIC_OR_DEPARTMENT_OTHER): Payer: Medicare Other | Admitting: Nurse Practitioner

## 2020-03-21 ENCOUNTER — Inpatient Hospital Stay: Payer: Medicare Other | Attending: Internal Medicine

## 2020-03-21 ENCOUNTER — Encounter: Payer: Self-pay | Admitting: Nurse Practitioner

## 2020-03-21 ENCOUNTER — Inpatient Hospital Stay: Payer: Medicare Other

## 2020-03-21 VITALS — BP 140/70 | HR 62 | Temp 97.0°F | Resp 16 | Ht 64.0 in | Wt 175.9 lb

## 2020-03-21 DIAGNOSIS — C241 Malignant neoplasm of ampulla of Vater: Secondary | ICD-10-CM

## 2020-03-21 DIAGNOSIS — G620 Drug-induced polyneuropathy: Secondary | ICD-10-CM | POA: Diagnosis not present

## 2020-03-21 DIAGNOSIS — Z95828 Presence of other vascular implants and grafts: Secondary | ICD-10-CM

## 2020-03-21 DIAGNOSIS — R978 Other abnormal tumor markers: Secondary | ICD-10-CM | POA: Diagnosis not present

## 2020-03-21 DIAGNOSIS — Z5111 Encounter for antineoplastic chemotherapy: Secondary | ICD-10-CM | POA: Insufficient documentation

## 2020-03-21 DIAGNOSIS — T451X5A Adverse effect of antineoplastic and immunosuppressive drugs, initial encounter: Secondary | ICD-10-CM | POA: Insufficient documentation

## 2020-03-21 DIAGNOSIS — Z23 Encounter for immunization: Secondary | ICD-10-CM | POA: Insufficient documentation

## 2020-03-21 DIAGNOSIS — C801 Malignant (primary) neoplasm, unspecified: Secondary | ICD-10-CM

## 2020-03-21 LAB — CMP (CANCER CENTER ONLY)
ALT: 27 U/L (ref 0–44)
AST: 41 U/L (ref 15–41)
Albumin: 3.4 g/dL — ABNORMAL LOW (ref 3.5–5.0)
Alkaline Phosphatase: 101 U/L (ref 38–126)
Anion gap: 9 (ref 5–15)
BUN: 25 mg/dL — ABNORMAL HIGH (ref 8–23)
CO2: 27 mmol/L (ref 22–32)
Calcium: 9.4 mg/dL (ref 8.9–10.3)
Chloride: 104 mmol/L (ref 98–111)
Creatinine: 0.98 mg/dL (ref 0.44–1.00)
GFR, Est AFR Am: >60 mL/min (ref >60)
GFR, Estimated: 54 mL/min — ABNORMAL LOW (ref >60)
Glucose, Bld: 161 mg/dL — ABNORMAL HIGH (ref 70–99)
Potassium: 3.7 mmol/L (ref 3.5–5.1)
Sodium: 140 mmol/L (ref 135–145)
Total Bilirubin: 0.7 mg/dL (ref 0.3–1.2)
Total Protein: 7.2 g/dL (ref 6.5–8.1)

## 2020-03-21 LAB — CBC WITH DIFFERENTIAL (CANCER CENTER ONLY)
Abs Immature Granulocytes: 0.03 10*3/uL (ref 0.00–0.07)
Basophils Absolute: 0 10*3/uL (ref 0.0–0.1)
Basophils Relative: 1 %
Eosinophils Absolute: 0.1 10*3/uL (ref 0.0–0.5)
Eosinophils Relative: 1 %
HCT: 34 % — ABNORMAL LOW (ref 36.0–46.0)
Hemoglobin: 10.8 g/dL — ABNORMAL LOW (ref 12.0–15.0)
Immature Granulocytes: 1 %
Lymphocytes Relative: 24 %
Lymphs Abs: 1.1 10*3/uL (ref 0.7–4.0)
MCH: 30.2 pg (ref 26.0–34.0)
MCHC: 31.8 g/dL (ref 30.0–36.0)
MCV: 95 fL (ref 80.0–100.0)
Monocytes Absolute: 0.5 10*3/uL (ref 0.1–1.0)
Monocytes Relative: 10 %
Neutro Abs: 3.1 10*3/uL (ref 1.7–7.7)
Neutrophils Relative %: 63 %
Platelet Count: 383 10*3/uL (ref 150–400)
RBC: 3.58 MIL/uL — ABNORMAL LOW (ref 3.87–5.11)
RDW: 16.3 % — ABNORMAL HIGH (ref 11.5–15.5)
WBC Count: 4.8 10*3/uL (ref 4.0–10.5)
nRBC: 0 % (ref 0.0–0.2)

## 2020-03-21 MED ORDER — PROCHLORPERAZINE MALEATE 10 MG PO TABS
ORAL_TABLET | ORAL | Status: AC
  Filled 2020-03-21: qty 1

## 2020-03-21 MED ORDER — SODIUM CHLORIDE 0.9% FLUSH
10.0000 mL | INTRAVENOUS | Status: DC | PRN
  Administered 2020-03-21: 10 mL
  Filled 2020-03-21: qty 10

## 2020-03-21 MED ORDER — SODIUM CHLORIDE 0.9 % IV SOLN
800.0000 mg/m2 | Freq: Once | INTRAVENOUS | Status: AC
  Administered 2020-03-21: 1558 mg via INTRAVENOUS
  Filled 2020-03-21: qty 40.98

## 2020-03-21 MED ORDER — HEPARIN SOD (PORK) LOCK FLUSH 100 UNIT/ML IV SOLN
500.0000 [IU] | Freq: Once | INTRAVENOUS | Status: AC | PRN
  Administered 2020-03-21: 500 [IU]
  Filled 2020-03-21: qty 5

## 2020-03-21 MED ORDER — PROCHLORPERAZINE MALEATE 10 MG PO TABS
10.0000 mg | ORAL_TABLET | Freq: Once | ORAL | Status: AC
  Administered 2020-03-21: 10 mg via ORAL

## 2020-03-21 MED ORDER — SODIUM CHLORIDE 0.9 % IV SOLN
Freq: Once | INTRAVENOUS | Status: AC
  Filled 2020-03-21: qty 250

## 2020-03-21 MED ORDER — SODIUM CHLORIDE 0.9% FLUSH
10.0000 mL | Freq: Once | INTRAVENOUS | Status: AC
  Administered 2020-03-21: 10 mL
  Filled 2020-03-21: qty 10

## 2020-03-21 NOTE — Patient Instructions (Signed)
[facility] Discharge Instructions for Patients Receiving Chemotherapy  Today you received the following chemotherapy agents Gemcitabine (GEMZAR).  To help prevent nausea and vomiting after your treatment, we encourage you to take your nausea medication as prescribed.   If you develop nausea and vomiting that is not controlled by your nausea medication, call the clinic.   BELOW ARE SYMPTOMS THAT SHOULD BE REPORTED IMMEDIATELY:  *FEVER GREATER THAN 100.5 F  *CHILLS WITH OR WITHOUT FEVER  NAUSEA AND VOMITING THAT IS NOT CONTROLLED WITH YOUR NAUSEA MEDICATION  *UNUSUAL SHORTNESS OF BREATH  *UNUSUAL BRUISING OR BLEEDING  TENDERNESS IN MOUTH AND THROAT WITH OR WITHOUT PRESENCE OF ULCERS  *URINARY PROBLEMS  *BOWEL PROBLEMS  UNUSUAL RASH Items with * indicate a potential emergency and should be followed up as soon as possible.  Feel free to call the clinic should you have any questions or concerns. The clinic phone number is [phone].  Please show the CHEMO ALERT CARD at check-in to the Emergency Department and triage nurse.   

## 2020-03-21 NOTE — Progress Notes (Signed)
[place] OFFICE PROGRESS NOTE   Diagnosis: Ampullary cancer  INTERVAL HISTORY:   Kaylee Mullins returns for follow-up.  She completed cycle 14 gemcitabine/Abraxane 02/29/2020.  She canceled appointments last week due to "vertigo".  The vertigo symptoms have resolved.  She had an episode of nausea earlier this week.  No diarrhea.  No fever, cough, shortness of breath.  Stable left leg weakness.  She reports increased numbness involving multiple toes on the right foot.  She continues to note bilateral leg swelling left greater than right.  Objective:  Vital signs in last 24 hours:  Blood pressure 140/70, pulse 62, temperature (!) 97 F (36.1 C), temperature source Temporal, resp. rate 16, height 5\' 4"  (1.626 m), weight 175 lb 14.4 oz (79.8 kg), SpO2 98 %.    HEENT: No thrush or ulcers. Resp: Lungs clear bilaterally. Cardio: Regular rate and rhythm. GI: Abdomen soft.  No hepatomegaly.  Tender left lower abdomen. Vascular: Edema at the lower legs bilaterally left greater than right. Neuro: Vibratory sense moderately decreased over the fingertips per tuning fork exam.  Port-A-Cath without erythema.  Lab Results:  Lab Results  Component Value Date   WBC 4.8 03/21/2020   HGB 10.8 (L) 03/21/2020   HCT 34.0 (L) 03/21/2020   MCV 95.0 03/21/2020   PLT 383 03/21/2020   NEUTROABS 3.1 03/21/2020    Imaging:  No results found.  Medications: I have reviewed the patient's current medications.  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 CT abdomen/pelvis 10/23/2016-ampullary mass, single mildly enlarged porta hepatis lymph node, no evidence of distant metastatic disease ? Status post pancreaticoduodenectomy 01/12/2017; pT3pN2 ? CT abdomen/pelvis 03/30/2018-ablation defect within the upper pole of the right kidney. No evidence of recurrent renal mass. New mild left  periaortic retroperitoneal lymphadenopathy. ? CT abdomen/pelvis 06/30/2018-6 mm right middle lobe nodule-slowly growing over multiple CTs, stable ablation defect in the right kidney, 1.9 cm left periaortic node increased from 1.2 cm, stable 1.5 cm porta hepatis node ? CT abdomen/pelvis 09/17/2018-surgical changes related to Whipple procedure. Stable upper abdominal/retroperitoneal lymphadenopathy. 5 mm right middle lobe nodule, stable from most recent CT but mildly progressive from priors. Postprocedural changes related to renal ablation in the medial right upper kidney. ? CTs 03/24/2019-a few scattered pulmonary nodules appears stable; liver has a slightly shrunken appearance and nodular appearance compatible with mild cirrhosis. No suspicious cystic or solid hepatic lesions. Upper abdominal and retroperitoneal lymphadenopathy appears similar compared to the prior study. No other new lymphadenopathy noted elsewhere in the abdomen or pelvis. ? PET scan 07/03/2019-hypermetabolic lymph node adjacent porta hepatis. Newfocal hypermetabolic lesion in the liver. Hypermetabolic activity in the left suprarenal location favored metastatic adenopathy in the retroperitoneum. Diffuse hypermetabolic activity within the stomach favor gastritis. ? Cycle 1 gemcitabine/Abraxane 08/31/2019 ? Cycle 2 gemcitabine/Abraxane 09/14/2019 ? Cycle 3 gemcitabine/Abraxane 09/29/2019 ? Cycle 4 gemcitabine/Abraxane 10/13/2019 ? Cycle 5 gemcitabine/Abraxane 10/27/2019 ? Cycle 6 gemcitabine/Abraxane 11/09/2019 ? CTs 11/13/2019-new 2 mm right apical and 3 mm left lower lobe nodules, stable portacaval node, enlargement of amorphous soft tissue encasing the left renal artery, stable liver lesion ? Cycle 7 gemcitabine/Abraxane 11/24/2019 ? Cycle 8 gemcitabine/Abraxane 12/07/2019 ? 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 Cycle 10 gemcitabine/Abraxane 01/05/2020 ? Cycle 11 gemcitabine/Abraxane 01/18/2020 ? CTs  01/30/2020-small right pleural effusion. Rounded peripheral mass along the margin of the right lower lobe posteriorly adjacent to the pleural effusion 3.0 x  1.9 cm compared to previous measurement of 3.0 x 1.9 cm. Stable 4 mm right upper lobe nodule. Stable 0.6 x 0.5 cm right middle lobe nodule. Stable 3 mm nodule in the superior segment left lower lobe. No new nodules identified. Reduced conspicuity of margins of the moderately heterogeneous anterior liver lesion measuring 5.2 x 2.5 x 4.4 cm, previous measurement 3.0 x 2.4 x 3.4 cm. Nodular contour of the liver. Region of hypodensity in segment 5 measuring 2.6 x 1.8 cm, previously 2.0 x 1.3 cm. Portacaval node 1.2 cm, previously 1.3 cm. Porta hepatis node 0.9 cm, formerly 1.3 cm. Abnormal indistinctly marginated soft tissue density probably reflecting nodal tissue surrounding the left proximal renal artery measuring 1.8 cm, formerly 1.9 cm. ? Cycle 12 gemcitabine/Abraxane 02/01/2020 ? Cycle13gemcitabine/Abraxane 02/16/2020 ? Cycle 14 gemcitabine/Abraxane 02/29/2020 ? Cycle 15 gemcitabine 03/21/2020 (Abraxane held due to neuropathy) 2. Biliary obstruction secondary to #1, status post placement of a metal, bile duct stent on 10/23/2016  3. 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 Renal cell carcinoma-status post ablation of a right renal mass 07/15/2016, biopsy confirmed papillary renal cell carcinoma, Fuhrman grade 3  5. CVA in 2016  6. History of thyroid cancer-status post thyroidectomy and radioactive iodine in 2005  7. Diabetes  8.Hypertension  9.Left lower extremity deep vein thrombosis June 2019, IVC filter placed July 2018 after she was diagnosed with a rectus hematoma while on Lovenox  10. Hospitalized 04/12/2019 through 04/14/2019 with traumatic hematoma right lower leg, followed at the wound clinic  11.Port-A-Cath placement interventional radiology 08/29/2019  12.Covid  vaccine-second injection administered 11/10/2019   Disposition: Kaylee Mullins appears stable.  She has completed 14 cycles of gemcitabine/Abraxane.  She has increased numbness in the toes, moderate decrease in vibratory sense over the fingertips.  She will receive gemcitabine alone today, hold Abraxane due to increased neuropathy symptoms.  We reviewed the CBC and chemistry panel from today.  Labs adequate for treatment.  She will return for lab, follow-up, chemotherapy in 2 weeks.  She will contact the office in the interim with any problems.    [name] ANP/GNP-BC   03/21/2020  11:41 AM

## 2020-03-22 LAB — CANCER ANTIGEN 19-9: CA 19-9: 1643 U/mL — ABNORMAL HIGH (ref 0–35)

## 2020-03-28 ENCOUNTER — Other Ambulatory Visit: Payer: Self-pay | Admitting: Internal Medicine

## 2020-03-29 ENCOUNTER — Ambulatory Visit: Payer: Medicare Other

## 2020-03-29 ENCOUNTER — Other Ambulatory Visit: Payer: Medicare Other

## 2020-03-29 ENCOUNTER — Ambulatory Visit: Payer: Medicare Other | Admitting: Oncology

## 2020-04-05 ENCOUNTER — Inpatient Hospital Stay: Payer: Medicare Other

## 2020-04-05 ENCOUNTER — Inpatient Hospital Stay (HOSPITAL_BASED_OUTPATIENT_CLINIC_OR_DEPARTMENT_OTHER): Payer: Medicare Other | Admitting: Nurse Practitioner

## 2020-04-05 ENCOUNTER — Encounter: Payer: Self-pay | Admitting: Nurse Practitioner

## 2020-04-05 ENCOUNTER — Other Ambulatory Visit: Payer: Self-pay

## 2020-04-05 VITALS — BP 176/63 | HR 55 | Temp 97.5°F | Resp 17

## 2020-04-05 VITALS — BP 170/65 | HR 55 | Temp 96.5°F | Resp 16 | Ht 64.0 in | Wt 178.0 lb

## 2020-04-05 DIAGNOSIS — C801 Malignant (primary) neoplasm, unspecified: Secondary | ICD-10-CM

## 2020-04-05 DIAGNOSIS — Z5111 Encounter for antineoplastic chemotherapy: Secondary | ICD-10-CM | POA: Diagnosis not present

## 2020-04-05 DIAGNOSIS — C241 Malignant neoplasm of ampulla of Vater: Secondary | ICD-10-CM

## 2020-04-05 DIAGNOSIS — Z95828 Presence of other vascular implants and grafts: Secondary | ICD-10-CM

## 2020-04-05 DIAGNOSIS — Z23 Encounter for immunization: Secondary | ICD-10-CM

## 2020-04-05 LAB — CBC WITH DIFFERENTIAL (CANCER CENTER ONLY)
Abs Immature Granulocytes: 0.04 10*3/uL (ref 0.00–0.07)
Basophils Absolute: 0 10*3/uL (ref 0.0–0.1)
Basophils Relative: 0 %
Eosinophils Absolute: 0.1 10*3/uL (ref 0.0–0.5)
Eosinophils Relative: 1 %
HCT: 34.2 % — ABNORMAL LOW (ref 36.0–46.0)
Hemoglobin: 10.9 g/dL — ABNORMAL LOW (ref 12.0–15.0)
Immature Granulocytes: 1 %
Lymphocytes Relative: 23 %
Lymphs Abs: 1.4 10*3/uL (ref 0.7–4.0)
MCH: 30.4 pg (ref 26.0–34.0)
MCHC: 31.9 g/dL (ref 30.0–36.0)
MCV: 95.5 fL (ref 80.0–100.0)
Monocytes Absolute: 0.4 10*3/uL (ref 0.1–1.0)
Monocytes Relative: 7 %
Neutro Abs: 4.2 10*3/uL (ref 1.7–7.7)
Neutrophils Relative %: 68 %
Platelet Count: 222 10*3/uL (ref 150–400)
RBC: 3.58 MIL/uL — ABNORMAL LOW (ref 3.87–5.11)
RDW: 16.9 % — ABNORMAL HIGH (ref 11.5–15.5)
WBC Count: 6.2 10*3/uL (ref 4.0–10.5)
nRBC: 0 % (ref 0.0–0.2)

## 2020-04-05 LAB — CMP (CANCER CENTER ONLY)
ALT: 40 U/L (ref 0–44)
AST: 45 U/L — ABNORMAL HIGH (ref 15–41)
Albumin: 3.2 g/dL — ABNORMAL LOW (ref 3.5–5.0)
Alkaline Phosphatase: 140 U/L — ABNORMAL HIGH (ref 38–126)
Anion gap: 9 (ref 5–15)
BUN: 25 mg/dL — ABNORMAL HIGH (ref 8–23)
CO2: 24 mmol/L (ref 22–32)
Calcium: 9 mg/dL (ref 8.9–10.3)
Chloride: 107 mmol/L (ref 98–111)
Creatinine: 0.9 mg/dL (ref 0.44–1.00)
GFR, Est AFR Am: >60 mL/min (ref >60)
GFR, Estimated: 60 mL/min — ABNORMAL LOW (ref >60)
Glucose, Bld: 110 mg/dL — ABNORMAL HIGH (ref 70–99)
Potassium: 3.9 mmol/L (ref 3.5–5.1)
Sodium: 140 mmol/L (ref 135–145)
Total Bilirubin: 0.6 mg/dL (ref 0.3–1.2)
Total Protein: 6.9 g/dL (ref 6.5–8.1)

## 2020-04-05 MED ORDER — SODIUM CHLORIDE 0.9% FLUSH
10.0000 mL | Freq: Once | INTRAVENOUS | Status: AC
  Administered 2020-04-05: 10 mL
  Filled 2020-04-05: qty 10

## 2020-04-05 MED ORDER — PROCHLORPERAZINE MALEATE 10 MG PO TABS
10.0000 mg | ORAL_TABLET | Freq: Once | ORAL | Status: AC
  Administered 2020-04-05: 10 mg via ORAL

## 2020-04-05 MED ORDER — SODIUM CHLORIDE 0.9 % IV SOLN
Freq: Once | INTRAVENOUS | Status: AC
  Filled 2020-04-05: qty 250

## 2020-04-05 MED ORDER — PROCHLORPERAZINE MALEATE 10 MG PO TABS
ORAL_TABLET | ORAL | Status: AC
  Filled 2020-04-05: qty 1

## 2020-04-05 MED ORDER — HEPARIN SOD (PORK) LOCK FLUSH 100 UNIT/ML IV SOLN
500.0000 [IU] | Freq: Once | INTRAVENOUS | Status: AC | PRN
  Administered 2020-04-05: 500 [IU]
  Filled 2020-04-05: qty 5

## 2020-04-05 MED ORDER — SODIUM CHLORIDE 0.9% FLUSH
10.0000 mL | INTRAVENOUS | Status: DC | PRN
  Administered 2020-04-05: 10 mL
  Filled 2020-04-05: qty 10

## 2020-04-05 MED ORDER — SODIUM CHLORIDE 0.9 % IV SOLN
800.0000 mg/m2 | Freq: Once | INTRAVENOUS | Status: AC
  Administered 2020-04-05: 1558 mg via INTRAVENOUS
  Filled 2020-04-05: qty 40.98

## 2020-04-05 NOTE — Progress Notes (Addendum)
[place] OFFICE PROGRESS NOTE   Diagnosis: Ampullary cancer  INTERVAL HISTORY:   Kaylee Mullins returns as scheduled.  She completed cycle 15 gemcitabine March 21, 2020.  Abraxane was held due to neuropathy.  She is no longer having numbness in the right toes.  She has stable numbness in the left leg which predated chemotherapy.  No nausea or vomiting.  Loose stools last night.  She has occasional abdominal pain.  The pain does not occur on a daily basis.  She takes pain medication if needed.  She estimates taking pain medication maybe twice a week.  She denies fever, cough, shortness of breath.  Objective:  Vital signs in last 24 hours:  Blood pressure (!) 170/65, pulse (!) 55, temperature (!) 96.5 F (35.8 C), temperature source Tympanic, resp. rate 16, height 5\' 4"  (1.626 m), weight 178 lb (80.7 kg), SpO2 100 %.    HEENT: No thrush or ulcers. Resp: Lungs clear bilaterally. Cardio: Regular rate and rhythm. GI: Abdomen soft and nontender.  No hepatomegaly. Vascular: Edema at the lower legs bilaterally left greater than right. Neuro: Vibratory sense moderately decreased over the fingertips per tuning fork exam. Port-A-Cath without erythema.  Lab Results:  Lab Results  Component Value Date   WBC 6.2 04/05/2020   HGB 10.9 (L) 04/05/2020   HCT 34.2 (L) 04/05/2020   MCV 95.5 04/05/2020   PLT 222 04/05/2020   NEUTROABS 4.2 04/05/2020    Imaging:  No results found.  Medications: I have reviewed the patient's current medications.  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 CT abdomen/pelvis 10/23/2016-ampullary mass, single mildly enlarged porta hepatis lymph node, no evidence of distant metastatic disease ? Status post pancreaticoduodenectomy 01/12/2017; pT3pN2 ? CT abdomen/pelvis 03/30/2018-ablation defect within the upper pole of the right kidney. No  evidence of recurrent renal mass. New mild left periaortic retroperitoneal lymphadenopathy. ? CT abdomen/pelvis 06/30/2018-6 mm right middle lobe nodule-slowly growing over multiple CTs, stable ablation defect in the right kidney, 1.9 cm left periaortic node increased from 1.2 cm, stable 1.5 cm porta hepatis node ? CT abdomen/pelvis 09/17/2018-surgical changes related to Whipple procedure. Stable upper abdominal/retroperitoneal lymphadenopathy. 5 mm right middle lobe nodule, stable from most recent CT but mildly progressive from priors. Postprocedural changes related to renal ablation in the medial right upper kidney. ? CTs 03/24/2019-a few scattered pulmonary nodules appears stable; liver has a slightly shrunken appearance and nodular appearance compatible with mild cirrhosis. No suspicious cystic or solid hepatic lesions. Upper abdominal and retroperitoneal lymphadenopathy appears similar compared to the prior study. No other new lymphadenopathy noted elsewhere in the abdomen or pelvis. ? PET scan 07/03/2019-hypermetabolic lymph node adjacent porta hepatis. Newfocal hypermetabolic lesion in the liver. Hypermetabolic activity in the left suprarenal location favored metastatic adenopathy in the retroperitoneum. Diffuse hypermetabolic activity within the stomach favor gastritis. ? Cycle 1 gemcitabine/Abraxane 08/31/2019 ? Cycle 2 gemcitabine/Abraxane 09/14/2019 ? Cycle 3 gemcitabine/Abraxane 09/29/2019 ? Cycle 4 gemcitabine/Abraxane 10/13/2019 ? Cycle 5 gemcitabine/Abraxane 10/27/2019 ? Cycle 6 gemcitabine/Abraxane 11/09/2019 ? CTs 11/13/2019-new 2 mm right apical and 3 mm left lower lobe nodules, stable portacaval node, enlargement of amorphous soft tissue encasing the left renal artery, stable liver lesion ? Cycle 7 gemcitabine/Abraxane 11/24/2019 ? Cycle 8 gemcitabine/Abraxane 12/07/2019 ? 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 Cycle 10 gemcitabine/Abraxane  01/05/2020 ? Cycle 11 gemcitabine/Abraxane 01/18/2020 ? CTs 01/30/2020-small right pleural effusion. Rounded peripheral mass along  the margin of the right lower lobe posteriorly adjacent to the pleural effusion 3.0 x 1.9 cm compared to previous measurement of 3.0 x 1.9 cm. Stable 4 mm right upper lobe nodule. Stable 0.6 x 0.5 cm right middle lobe nodule. Stable 3 mm nodule in the superior segment left lower lobe. No new nodules identified. Reduced conspicuity of margins of the moderately heterogeneous anterior liver lesion measuring 5.2 x 2.5 x 4.4 cm, previous measurement 3.0 x 2.4 x 3.4 cm. Nodular contour of the liver. Region of hypodensity in segment 5 measuring 2.6 x 1.8 cm, previously 2.0 x 1.3 cm. Portacaval node 1.2 cm, previously 1.3 cm. Porta hepatis node 0.9 cm, formerly 1.3 cm. Abnormal indistinctly marginated soft tissue density probably reflecting nodal tissue surrounding the left proximal renal artery measuring 1.8 cm, formerly 1.9 cm. ? Cycle 12 gemcitabine/Abraxane 02/01/2020 ? Cycle13gemcitabine/Abraxane 02/16/2020 ? Cycle 14 gemcitabine/Abraxane 02/29/2020 ? 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 Cycle 16 gemcitabine 04/05/2020 (Abraxane held due to neuropathy) 2. Biliary obstruction secondary to #1, status post placement of a metal, bile duct stent on 10/23/2016  3. 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 Renal cell carcinoma-status post ablation of a right renal mass 07/15/2016, biopsy confirmed papillary renal cell carcinoma, Fuhrman grade 3  5. CVA in 2016  6. History of thyroid cancer-status post thyroidectomy and radioactive iodine in 2005  7. Diabetes  8.Hypertension  9.Left lower extremity deep vein thrombosis June 2019, IVC filter placed July 2018 after she was diagnosed with a rectus hematoma while on Lovenox  10. Hospitalized 04/12/2019 through 04/14/2019 with traumatic hematoma  right lower leg, followed at the wound clinic  11.Port-A-Cath placement interventional radiology 08/29/2019  12.Covid vaccine-second injection administered 11/10/2019; booster 04/05/2020   Disposition: Kaylee Mullins appears stable.  She has completed 15 cycles of gemcitabine/Abraxane.  Abraxane was held with cycle 15 due to neuropathy symptoms.  There is no clinical evidence of disease progression and the most recent CA 19-9 was further improved.  Neuropathy symptoms are better though she continues to have a moderate decrease in vibratory sense over the fingertips.  Plan to continue to hold Abraxane.  Gemcitabine alone today.  We reviewed the CBC from today.  Counts are adequate to proceed with gemcitabine.  Covid booster today.  She will return for lab, follow-up, chemotherapy in 2 weeks.  Patient seen with Dr. Benay Spice.  [name] ANP/GNP-BC   04/05/2020  12:44 PM This was a shared visit with [name].  Abraxane will remain on hold secondary to neuropathy.  Her overall clinical status appears unchanged.  The plan is to continue treatment with gemcitabine.  [name], MD

## 2020-04-05 NOTE — Patient Instructions (Signed)
[facility] Discharge Instructions for Patients Receiving Chemotherapy  Today you received the following chemotherapy agents:  Gemcitabine  To help prevent nausea and vomiting after your treatment, we encourage you to take your nausea medication as prescribed.    If you develop nausea and vomiting that is not controlled by your nausea medication, call the clinic.   BELOW ARE SYMPTOMS THAT SHOULD BE REPORTED IMMEDIATELY:  *FEVER GREATER THAN 100.5 F  *CHILLS WITH OR WITHOUT FEVER  NAUSEA AND VOMITING THAT IS NOT CONTROLLED WITH YOUR NAUSEA MEDICATION  *UNUSUAL SHORTNESS OF BREATH  *UNUSUAL BRUISING OR BLEEDING  TENDERNESS IN MOUTH AND THROAT WITH OR WITHOUT PRESENCE OF ULCERS  *URINARY PROBLEMS  *BOWEL PROBLEMS  UNUSUAL RASH Items with * indicate a potential emergency and should be followed up as soon as possible.  Feel free to call the clinic should you have any questions or concerns. The clinic phone number is [phone].  Please show the CHEMO ALERT CARD at check-in to the Emergency Department and triage nurse.   

## 2020-04-05 NOTE — Progress Notes (Signed)
   Covid-19 Vaccination Clinic  Name:  Kaylee Mullins    MRN: [id number] DOB: [birth year]  04/05/2020  [name] was observed post Covid-19 immunization for [employer] booster shot at 13:28 without incident. She was provided with Vaccine Information Sheet and instruction to access the V-Safe system.   [name] was instructed to call 911 with any severe reactions post vaccine: Marland Kitchen Difficulty breathing  . Swelling of face and throat  . A fast heartbeat  . A bad rash all over body  . Dizziness and weakness

## 2020-04-06 LAB — CANCER ANTIGEN 19-9: CA 19-9: 2062 U/mL — ABNORMAL HIGH (ref 0–35)

## 2020-04-08 ENCOUNTER — Telehealth: Payer: Self-pay | Admitting: Nurse Practitioner

## 2020-04-08 NOTE — Telephone Encounter (Signed)
Scheduled appointments per 8/20 los Attempted al call patient using both phone numbers listed. There was no answer and no voicemail's set up for either. Will mail patient calendar.

## 2020-04-14 ENCOUNTER — Other Ambulatory Visit: Payer: Self-pay | Admitting: Oncology

## 2020-04-18 ENCOUNTER — Other Ambulatory Visit: Payer: Self-pay

## 2020-04-18 ENCOUNTER — Inpatient Hospital Stay: Payer: Medicare Other

## 2020-04-18 ENCOUNTER — Inpatient Hospital Stay (HOSPITAL_BASED_OUTPATIENT_CLINIC_OR_DEPARTMENT_OTHER): Payer: Medicare Other | Admitting: Oncology

## 2020-04-18 ENCOUNTER — Inpatient Hospital Stay: Payer: Medicare Other | Attending: Internal Medicine

## 2020-04-18 VITALS — BP 125/61 | HR 75 | Temp 98.0°F | Resp 16 | Ht 64.0 in | Wt 180.1 lb

## 2020-04-18 DIAGNOSIS — I2510 Atherosclerotic heart disease of native coronary artery without angina pectoris: Secondary | ICD-10-CM | POA: Insufficient documentation

## 2020-04-18 DIAGNOSIS — Z5111 Encounter for antineoplastic chemotherapy: Secondary | ICD-10-CM | POA: Diagnosis not present

## 2020-04-18 DIAGNOSIS — C241 Malignant neoplasm of ampulla of Vater: Secondary | ICD-10-CM

## 2020-04-18 DIAGNOSIS — C787 Secondary malignant neoplasm of liver and intrahepatic bile duct: Secondary | ICD-10-CM | POA: Insufficient documentation

## 2020-04-18 DIAGNOSIS — Z85528 Personal history of other malignant neoplasm of kidney: Secondary | ICD-10-CM | POA: Diagnosis not present

## 2020-04-18 DIAGNOSIS — R600 Localized edema: Secondary | ICD-10-CM | POA: Diagnosis not present

## 2020-04-18 DIAGNOSIS — I700 Atherosclerosis of aorta: Secondary | ICD-10-CM | POA: Diagnosis not present

## 2020-04-18 DIAGNOSIS — Z86718 Personal history of other venous thrombosis and embolism: Secondary | ICD-10-CM | POA: Diagnosis not present

## 2020-04-18 DIAGNOSIS — Z85850 Personal history of malignant neoplasm of thyroid: Secondary | ICD-10-CM | POA: Insufficient documentation

## 2020-04-18 DIAGNOSIS — E1140 Type 2 diabetes mellitus with diabetic neuropathy, unspecified: Secondary | ICD-10-CM | POA: Diagnosis not present

## 2020-04-18 DIAGNOSIS — Z8673 Personal history of transient ischemic attack (TIA), and cerebral infarction without residual deficits: Secondary | ICD-10-CM | POA: Diagnosis not present

## 2020-04-18 DIAGNOSIS — I10 Essential (primary) hypertension: Secondary | ICD-10-CM | POA: Diagnosis not present

## 2020-04-18 LAB — CBC WITH DIFFERENTIAL (CANCER CENTER ONLY)
Abs Immature Granulocytes: 0.03 10*3/uL (ref 0.00–0.07)
Basophils Absolute: 0 10*3/uL (ref 0.0–0.1)
Basophils Relative: 1 %
Eosinophils Absolute: 0 10*3/uL (ref 0.0–0.5)
Eosinophils Relative: 1 %
HCT: 32.2 % — ABNORMAL LOW (ref 36.0–46.0)
Hemoglobin: 10.5 g/dL — ABNORMAL LOW (ref 12.0–15.0)
Immature Granulocytes: 1 %
Lymphocytes Relative: 23 %
Lymphs Abs: 1.1 10*3/uL (ref 0.7–4.0)
MCH: 30.6 pg (ref 26.0–34.0)
MCHC: 32.6 g/dL (ref 30.0–36.0)
MCV: 93.9 fL (ref 80.0–100.0)
Monocytes Absolute: 0.4 10*3/uL (ref 0.1–1.0)
Monocytes Relative: 8 %
Neutro Abs: 3.3 10*3/uL (ref 1.7–7.7)
Neutrophils Relative %: 66 %
Platelet Count: 197 10*3/uL (ref 150–400)
RBC: 3.43 MIL/uL — ABNORMAL LOW (ref 3.87–5.11)
RDW: 16.5 % — ABNORMAL HIGH (ref 11.5–15.5)
WBC Count: 4.8 10*3/uL (ref 4.0–10.5)
nRBC: 0 % (ref 0.0–0.2)

## 2020-04-18 LAB — CMP (CANCER CENTER ONLY)
ALT: 31 U/L (ref 0–44)
AST: 35 U/L (ref 15–41)
Albumin: 3.2 g/dL — ABNORMAL LOW (ref 3.5–5.0)
Alkaline Phosphatase: 171 U/L — ABNORMAL HIGH (ref 38–126)
Anion gap: 7 (ref 5–15)
BUN: 23 mg/dL (ref 8–23)
CO2: 27 mmol/L (ref 22–32)
Calcium: 9.1 mg/dL (ref 8.9–10.3)
Chloride: 109 mmol/L (ref 98–111)
Creatinine: 0.82 mg/dL (ref 0.44–1.00)
GFR, Est AFR Am: >60 mL/min (ref >60)
GFR, Estimated: >60 mL/min (ref >60)
Glucose, Bld: 191 mg/dL — ABNORMAL HIGH (ref 70–99)
Potassium: 3.9 mmol/L (ref 3.5–5.1)
Sodium: 143 mmol/L (ref 135–145)
Total Bilirubin: 0.6 mg/dL (ref 0.3–1.2)
Total Protein: 6.7 g/dL (ref 6.5–8.1)

## 2020-04-18 MED ORDER — SODIUM CHLORIDE 0.9 % IV SOLN
Freq: Once | INTRAVENOUS | Status: DC
  Filled 2020-04-18: qty 250

## 2020-04-18 MED ORDER — SODIUM CHLORIDE 0.9% FLUSH
10.0000 mL | INTRAVENOUS | Status: DC | PRN
  Administered 2020-04-18: 10 mL
  Filled 2020-04-18: qty 10

## 2020-04-18 MED ORDER — PROCHLORPERAZINE MALEATE 10 MG PO TABS
ORAL_TABLET | ORAL | Status: AC
  Filled 2020-04-18: qty 1

## 2020-04-18 MED ORDER — SODIUM CHLORIDE 0.9 % IV SOLN
800.0000 mg/m2 | Freq: Once | INTRAVENOUS | Status: AC
  Administered 2020-04-18: 1558 mg via INTRAVENOUS
  Filled 2020-04-18: qty 40.98

## 2020-04-18 MED ORDER — HEPARIN SOD (PORK) LOCK FLUSH 100 UNIT/ML IV SOLN
500.0000 [IU] | Freq: Once | INTRAVENOUS | Status: AC | PRN
  Administered 2020-04-18: 500 [IU]
  Filled 2020-04-18: qty 5

## 2020-04-18 MED ORDER — PROCHLORPERAZINE MALEATE 10 MG PO TABS
10.0000 mg | ORAL_TABLET | Freq: Once | ORAL | Status: AC
  Administered 2020-04-18: 10 mg via ORAL

## 2020-04-18 MED ORDER — SODIUM CHLORIDE 0.9 % IV SOLN
Freq: Once | INTRAVENOUS | Status: AC
  Filled 2020-04-18: qty 250

## 2020-04-18 NOTE — Progress Notes (Signed)
[place] OFFICE PROGRESS NOTE   Diagnosis: Pancreas cancer  INTERVAL HISTORY:   Kaylee Mullins completed another treatment with gemcitabine on 04/05/2020.  She continues to have numbness in the first 3 toes of the right foot.  She has increased pain in the low anterior chest/upper abdomen.  Objective:  Vital signs in last 24 hours:  Blood pressure 125/61, pulse 75, temperature 98 F (36.7 C), temperature source Tympanic, resp. rate 16, height 5\' 4"  (1.626 m), weight 180 lb 1.6 oz (81.7 kg), SpO2 100 %.    Resp: Inspiratory rhonchi at the left lower posterior chest, no respiratory distress Cardio: Regular rate and rhythm GI: Soft, no hepatosplenomegaly, no mass, mild tenderness over the low mid anterior chest wall bilaterally. Vascular: 1+ lower leg edema bilaterally    Portacath/PICC-without erythema  Lab Results:  Lab Results  Component Value Date   WBC 4.8 04/18/2020   HGB 10.5 (L) 04/18/2020   HCT 32.2 (L) 04/18/2020   MCV 93.9 04/18/2020   PLT 197 04/18/2020   NEUTROABS 3.3 04/18/2020    CMP  Lab Results  Component Value Date   NA 140 04/05/2020   K 3.9 04/05/2020   CL 107 04/05/2020   CO2 24 04/05/2020   GLUCOSE 110 (H) 04/05/2020   BUN 25 (H) 04/05/2020   CREATININE 0.90 04/05/2020   CALCIUM 9.0 04/05/2020   PROT 6.9 04/05/2020   ALBUMIN 3.2 (L) 04/05/2020   AST 45 (H) 04/05/2020   ALT 40 04/05/2020   ALKPHOS 140 (H) 04/05/2020   BILITOT 0.6 04/05/2020   GFRNONAA 60 (L) 04/05/2020   GFRAA >60 04/05/2020     Medications: I have reviewed the patient's current medications.   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 CT abdomen/pelvis 10/23/2016-ampullary mass, single mildly enlarged porta hepatis lymph node, no evidence of distant metastatic disease ? Status post pancreaticoduodenectomy 01/12/2017; pT3pN2 ? CT abdomen/pelvis  03/30/2018-ablation defect within the upper pole of the right kidney. No evidence of recurrent renal mass. New mild left periaortic retroperitoneal lymphadenopathy. ? CT abdomen/pelvis 06/30/2018-6 mm right middle lobe nodule-slowly growing over multiple CTs, stable ablation defect in the right kidney, 1.9 cm left periaortic node increased from 1.2 cm, stable 1.5 cm porta hepatis node ? CT abdomen/pelvis 09/17/2018-surgical changes related to Whipple procedure. Stable upper abdominal/retroperitoneal lymphadenopathy. 5 mm right middle lobe nodule, stable from most recent CT but mildly progressive from priors. Postprocedural changes related to renal ablation in the medial right upper kidney. ? CTs 03/24/2019-a few scattered pulmonary nodules appears stable; liver has a slightly shrunken appearance and nodular appearance compatible with mild cirrhosis. No suspicious cystic or solid hepatic lesions. Upper abdominal and retroperitoneal lymphadenopathy appears similar compared to the prior study. No other new lymphadenopathy noted elsewhere in the abdomen or pelvis. ? PET scan 07/03/2019-hypermetabolic lymph node adjacent porta hepatis. Newfocal hypermetabolic lesion in the liver. Hypermetabolic activity in the left suprarenal location favored metastatic adenopathy in the retroperitoneum. Diffuse hypermetabolic activity within the stomach favor gastritis. ? Cycle 1 gemcitabine/Abraxane 08/31/2019 ? Cycle 2 gemcitabine/Abraxane 09/14/2019 ? Cycle 3 gemcitabine/Abraxane 09/29/2019 ? Cycle 4 gemcitabine/Abraxane 10/13/2019 ? Cycle 5 gemcitabine/Abraxane 10/27/2019 ? Cycle 6 gemcitabine/Abraxane 11/09/2019 ? CTs 11/13/2019-new 2 mm right apical and 3 mm left lower lobe nodules, stable portacaval node, enlargement of amorphous soft tissue encasing the left renal artery, stable liver lesion ? Cycle 7 gemcitabine/Abraxane 11/24/2019 ? Cycle 8 gemcitabine/Abraxane 12/07/2019 ? 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 Cycle 10 gemcitabine/Abraxane 01/05/2020 ? Cycle 11 gemcitabine/Abraxane 01/18/2020 ? CTs 01/30/2020-small right pleural effusion. Rounded peripheral mass along the margin of the right lower lobe posteriorly adjacent to the pleural effusion 3.0 x 1.9 cm compared to previous measurement of 3.0 x 1.9 cm. Stable 4 mm right upper lobe nodule. Stable 0.6 x 0.5 cm right middle lobe nodule. Stable 3 mm nodule in the superior segment left lower lobe. No new nodules identified. Reduced conspicuity of margins of the moderately heterogeneous anterior liver lesion measuring 5.2 x 2.5 x 4.4 cm, previous measurement 3.0 x 2.4 x 3.4 cm. Nodular contour of the liver. Region of hypodensity in segment 5 measuring 2.6 x 1.8 cm, previously 2.0 x 1.3 cm. Portacaval node 1.2 cm, previously 1.3 cm. Porta hepatis node 0.9 cm, formerly 1.3 cm. Abnormal indistinctly marginated soft tissue density probably reflecting nodal tissue surrounding the left proximal renal artery measuring 1.8 cm, formerly 1.9 cm. ? Cycle 12 gemcitabine/Abraxane 02/01/2020 ? Cycle13gemcitabine/Abraxane 02/16/2020 ? Cycle 14 gemcitabine/Abraxane 02/29/2020 ? 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 Cycle 16 gemcitabine 04/05/2020 (Abraxane held due to neuropathy) ? Cycle 17 gemcitabine 04/18/2020 (Abraxane held due to neuropathy) 2. Biliary obstruction secondary to #1, status post placement of a metal, bile duct stent on 10/23/2016  3. 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 Renal cell carcinoma-status post ablation of a right renal mass 07/15/2016, biopsy confirmed papillary renal cell carcinoma, Fuhrman grade 3  5. CVA in 2016  6. History of thyroid cancer-status post thyroidectomy and radioactive iodine in 2005  7. Diabetes  8.Hypertension  9.Left lower extremity deep vein thrombosis June 2019, IVC filter placed July 2018 after she  was diagnosed with a rectus hematoma while on Lovenox  10. Hospitalized 04/12/2019 through 04/14/2019 with traumatic hematoma right lower leg, followed at the wound clinic  11.Port-A-Cath placement interventional radiology 08/29/2019  12.Covid vaccine-second injection administered 11/10/2019; booster 04/05/2020     Disposition: Ms. Weissman appears unchanged, though she reports increased discomfort at the upper abdomen/low anterior chest.  The CA 19-9 has not changed significantly over the past several months.  We will follow up on the CA 19-9 from today. She will complete another treatment with gemcitabine today.  Ms. Tung will undergo a restaging CT evaluation prior to an office visit in 2 weeks. Betsy Coder, MD  04/18/2020  10:33 AM

## 2020-04-18 NOTE — Patient Instructions (Signed)
[facility] Discharge Instructions for Patients Receiving Chemotherapy  Today you received the following chemotherapy agent: Gemcitabine (gemzar)  To help prevent nausea and vomiting after your treatment, we encourage you to take your nausea medication as directed by your MD.   If you develop nausea and vomiting that is not controlled by your nausea medication, call the clinic.   BELOW ARE SYMPTOMS THAT SHOULD BE REPORTED IMMEDIATELY:  *FEVER GREATER THAN 100.5 F  *CHILLS WITH OR WITHOUT FEVER  NAUSEA AND VOMITING THAT IS NOT CONTROLLED WITH YOUR NAUSEA MEDICATION  *UNUSUAL SHORTNESS OF BREATH  *UNUSUAL BRUISING OR BLEEDING  TENDERNESS IN MOUTH AND THROAT WITH OR WITHOUT PRESENCE OF ULCERS  *URINARY PROBLEMS  *BOWEL PROBLEMS  UNUSUAL RASH Items with * indicate a potential emergency and should be followed up as soon as possible.  Feel free to call the clinic should you have any questions or concerns. The clinic phone number is [phone].  Please show the CHEMO ALERT CARD at check-in to the Emergency Department and triage nurse.   

## 2020-04-19 ENCOUNTER — Telehealth: Payer: Self-pay | Admitting: Oncology

## 2020-04-19 ENCOUNTER — Telehealth: Payer: Self-pay | Admitting: *Deleted

## 2020-04-19 LAB — CANCER ANTIGEN 19-9: CA 19-9: 2849 U/mL — ABNORMAL HIGH (ref 0–35)

## 2020-04-19 NOTE — Telephone Encounter (Signed)
Scheduled appointments per 9/2 los. Patient is aware of appointment changes.  

## 2020-04-19 NOTE — Telephone Encounter (Signed)
Called patient w/CT appointment for 9/14 at 1245/1:00 at [facility]. NPO for 4 hours prior and drink oral contrast at 11am and 12noon. She will pick up contrast when she is out next week on 04/23/20. Scheduling message sent to move her lab/flush closer to the scan time.

## 2020-04-22 NOTE — Progress Notes (Signed)
Cardiology Office Note:    Date:  04/23/2020   ID:  Kaylee Mullins, DOB [birth year], MRN [id number]  PCP:  Kaylee Ebbs, MD  [facility] Cardiologist:  Kaylee Carnes, MD   [facility] Electrophysiologist:  None   Referring MD: Kaylee Ebbs, MD   Chief Complaint:  Follow-up (SVT, diastolic heart failure)    Patient Profile:    Kaylee Mullins is a 84 y.o. female with:   PSVT  Diastolic CHF  Diabetes mellitus   Chronic kidney disease   Hypertension   Hyperlipidemia   Hx of CVA  Renal Cell CA s/p ablation  Thyroid CA   Ampullary CA s/p [place] in 5/18  C/b SVT, DVT, rectus sheath hematoma >> anticoag DCd, s/p IVC filter  On Gemcitabine (Dr. Benay Mullins)  Hx of recurrent DVT (extensive bilateral) in 07/2017 >> Warfarin  Prior CV studies:   Echo 06/28/17 Mild concentric LVH, EF 60-65, normal wall motion, grade 1 diastolic dysfunction, mild AI, mild LAE, normal RVSF  Nuclear stress test 6/17 Inferior/inferolateral infarct with mild peri-infarct ischemia versus diaphragmatic attenuation, no significant ischemia, EF 56, low risk  Echo 6/17 Mild concentric LVH, EF 55-60, normal wall motion, grade 2 diastolic dysfunction, trivial AI  Carotid US 1/16 Bilateral ICA 1-39   History of Present Illness:    Kaylee Mullins was last seen by Dr. Harrington Mullins in 9/19.  She returns for follow-up.  She is here alone.  She has some lower rib pain but no exertional chest heaviness.  She has not had orthopnea or paroxysmal nocturnal dyspnea.  She has not had syncope.  She has chronic lower extremity swelling.  The right leg is worse than the left.      Past Medical History:  Diagnosis Date  . Acute CVA (cerebrovascular accident) ([place]) 09/16/2014  . Acute DVT of left tibial vein ([place]) 04/03/2017  . Adenocarcinoma ([place]) 01/12/2017  . Ampullary carcinoma ([place]) 10/27/2016  . Anxiety   . Arthritis    knees  . Black tarry stools    05-14-16 negative for occult blood with ER visit-  noted in [place].  . Cancer [facility])    thyroid cancer- surgery and radiation  . Cerebral infarction due to unspecified mechanism   . Chronic kidney disease    questionable mass on kidney. Being followed by Dr Kaylee Mullins  . Complication of anesthesia    heart rate was really low  . CVA (cerebral infarction) 09/11/2014  . Depression   . Diabetes mellitus    type 2  . Diabetes mellitus without complication ([place]) 12/22/3218   Qualifier: Diagnosis of  By: Loanne Drilling MD, Jacelyn Pi   . Dyslipidemia 04/20/2007   Qualifier: Diagnosis of  By: Loanne Drilling MD, Jacelyn Pi   . Full dentures   . Gastroparesis 02/06/2017  . GERD (gastroesophageal reflux disease) 04/03/2017  . History of CVA (cerebral vascular accident) ([place]) 09/11/2014  . Hypertension   . Hypokalemia 08/25/2017  . Hypothyroidism   . Lumbar stenosis with neurogenic claudication 02/10/2016  . Osteoarthritis 09/11/2014  . Pneumonia   . Right renal mass 09/13/2014  . Spinal stenosis   . Stroke ([place]) 09/2014   left sided weakness  . SVT (supraventricular tachycardia) ([place]) 07/30/2017    Current Medications: Current Meds  Medication Sig  . acetaminophen (TYLENOL) 500 MG tablet Take 500 mg by mouth every 6 (six) hours as needed for mild pain.  . carvedilol (COREG) 25 MG tablet Take 1 tablet (25 mg total) by mouth 2 (two) times daily.  Marland Kitchen  furosemide (LASIX) 80 MG tablet TAKE 1 TABLET BY MOUTH EVERY DAY ALTERNATING WITH 40MG   . HUMALOG KWIKPEN 100 UNIT/ML KwikPen Inject 7-10 Units into the skin See admin instructions. Three times daily per sliding scale  . insulin detemir (LEVEMIR) 100 UNIT/ML injection Inject 0.07 mLs (7 Units total) into the skin 2 (two) times daily. (Patient taking differently: Inject 40 Units into the skin daily. )  . levothyroxine (SYNTHROID, LEVOTHROID) 200 MCG tablet Take 200 mcg by mouth daily before breakfast. For hypothyroidism  . lidocaine-prilocaine (EMLA) cream Apply to port site 1-2 hours prior to use  . NIFEdipine  (PROCARDIA-XL/ADALAT-CC/NIFEDICAL-XL) 30 MG 24 hr tablet Take 30 mg by mouth daily.  . ondansetron (ZOFRAN) 8 MG tablet Take 1 tablet (8 mg total) by mouth every 8 (eight) hours as needed for nausea or vomiting.  Marland Kitchen oxyCODONE-acetaminophen (PERCOCET/ROXICET) 5-325 MG tablet Take 1-2 tablets by mouth every 8 (eight) hours as needed for severe pain.  . pantoprazole (PROTONIX) 40 MG tablet Take 1 tablet (40 mg total) by mouth at bedtime.  . Polyethylene Glycol 3350 (MIRALAX PO) Take 17 g by mouth daily as needed.   . potassium chloride SA (KLOR-CON M20) 20 MEQ tablet Take 3 tablets daily alternating with 1 tablet daily.  Take with furosemide.  . rosuvastatin (CRESTOR) 20 MG tablet Take 20 mg by mouth daily.   . Vitamin D, Ergocalciferol, (DRISDOL) 1.25 MG (50000 UNIT) CAPS capsule Take 1 capsule (50,000 Units total) by mouth once a week.  . warfarin (COUMADIN) 7.5 MG tablet Take 1 tablet (7.5 mg total) by mouth one time only at 6 PM.     Allergies:   Hydralazine hcl, Amlodipine besylate, Darvon [propoxyphene hcl], Nyquil multi-symptom [pseudoeph-doxylamine-dm-apap], and Metformin and related   Social History   Tobacco Use  . Smoking status: Never Smoker  . Smokeless tobacco: Never Used  Vaping Use  . Vaping Use: Never used  Substance Use Topics  . Alcohol use: No  . Drug use: Yes    Types: Methamphetamines     Family Hx: The patient's family history includes Heart attack in her father; Heart failure in her mother.  ROS   EKGs/Labs/Other Test Reviewed:    EKG:  EKG is   ordered today.  The ekg ordered today demonstrates sinus rhythm, heart rate 61, normal axis, low voltage, PVC, QTC 473, nonspecific ST-T wave changes  Recent Labs: 04/18/2020: ALT 31; BUN 23; Creatinine 0.82; Hemoglobin 10.5; Platelet Count 197; Potassium 3.9; Sodium 143   Recent Lipid Panel Lab Results  Component Value Date/Time   CHOL 120 04/09/2017 12:00 AM   TRIG 137 04/09/2017 12:00 AM   HDL 35 04/09/2017  12:00 AM   CHOLHDL 3.0 09/13/2014 09:10 AM   LDLCALC 58 04/09/2017 12:00 AM    Physical Exam:    VS:  BP (!) 146/74   Pulse 61   Ht 5\' 4"  (1.626 m)   Wt 178 lb (80.7 kg)   SpO2 99%   BMI 30.55 kg/m     Wt Readings from Last 3 Encounters:  04/23/20 178 lb (80.7 kg)  04/18/20 180 lb 1.6 oz (81.7 kg)  04/05/20 178 lb (80.7 kg)     Constitutional:      Appearance: Healthy appearance. Not in distress.  Pulmonary:     Effort: Pulmonary effort is normal.     Breath sounds: No wheezing. No rales.  Cardiovascular:     Normal rate. Regular rhythm. Normal S1. Normal S2.     Murmurs: There  is a grade 1/6 systolic murmur at the URSB.  Edema:    Pretibial: bilateral 1+ pitting edema of the pretibial area.    Ankle: bilateral 1+ pitting edema of the ankle. Abdominal:     Palpations: Abdomen is soft.  Musculoskeletal:     Cervical back: Neck supple. Skin:    General: Skin is warm and dry.  Neurological:     Mental Status: Alert and oriented to person, place and time.     Cranial Nerves: Cranial nerves are intact.      ASSESSMENT & PLAN:    1. SVT (supraventricular tachycardia) (HCC) Overall quiescent.  Continue current dose of carvedilol.  2. Chronic diastolic heart failure (HCC) Echocardiogram in 2018 demonstrated EF 60-65 with grade 1 diastolic dysfunction.  She is fairly sedentary.  Overall, volume status is stable.  She does have lower extremity swelling is multifactorial and mainly related to venous insufficiency and prior DVT.  I have advised her to use leg elevation to help manage her leg swelling.  She can take an extra dose of furosemide if needed.  However, I cautioned her against taking higher dose furosemide on daily basis.  3. History of recurrent deep vein thrombosis (DVT) She is on chronic warfarin therapy.  This is managed by primary care.  4. Essential hypertension Blood pressure somewhat elevated today.  She was taken off of losartan.  She is not certain why.   I have asked her to monitor her blood pressure over the next week and send me those readings for review.  If her blood pressure remains above target, consider increasing nifedipine.  5. Mixed hyperlipidemia Managed by primary care.  6. History of cancer of ampulla of duodenum She is followed closely by oncology and undergoes chemotherapy administration every 2 weeks.    Dispo:  Return in about 1 year (around 04/23/2021) for Routine Follow Up, w/ Dr. Harrington Mullins, or Richardson Dopp, PA-C, in person.   Medication Adjustments/Labs and Tests Ordered: Current medicines are reviewed at length with the patient today.  Concerns regarding medicines are outlined above.  Tests Ordered: Orders Placed This Encounter  Procedures  . EKG 12-Lead   Medication Changes: No orders of the defined types were placed in this encounter.   Signed, Richardson Dopp, PA-C  04/23/2020 1:08 PM    [facility] [place], [place], [place]  [address] Phone: [phone]; Fax: [phone]

## 2020-04-23 ENCOUNTER — Ambulatory Visit (INDEPENDENT_AMBULATORY_CARE_PROVIDER_SITE_OTHER): Payer: Medicare Other | Admitting: Physician Assistant

## 2020-04-23 ENCOUNTER — Encounter: Payer: Self-pay | Admitting: Physician Assistant

## 2020-04-23 ENCOUNTER — Other Ambulatory Visit: Payer: Self-pay

## 2020-04-23 VITALS — BP 146/74 | HR 61 | Ht 64.0 in | Wt 178.0 lb

## 2020-04-23 DIAGNOSIS — I471 Supraventricular tachycardia: Secondary | ICD-10-CM | POA: Diagnosis not present

## 2020-04-23 DIAGNOSIS — Z86718 Personal history of other venous thrombosis and embolism: Secondary | ICD-10-CM

## 2020-04-23 DIAGNOSIS — Z8509 Personal history of malignant neoplasm of other digestive organs: Secondary | ICD-10-CM

## 2020-04-23 DIAGNOSIS — I10 Essential (primary) hypertension: Secondary | ICD-10-CM

## 2020-04-23 DIAGNOSIS — I5032 Chronic diastolic (congestive) heart failure: Secondary | ICD-10-CM

## 2020-04-23 DIAGNOSIS — E782 Mixed hyperlipidemia: Secondary | ICD-10-CM

## 2020-04-23 NOTE — Patient Instructions (Signed)
Medication Instructions:  Your physician recommends that you continue on your current medications as directed. Please refer to the Current Medication list given to you today.  *If you need a refill on your cardiac medications before your next appointment, please call your pharmacy*  Lab Work: None ordered today  Testing/Procedures: None ordered today  Follow-Up: At [facility], you and your health needs are our priority.  As part of our continuing mission to provide you with exceptional heart care, we have created designated Provider Care Teams.  These Care Teams include your primary Cardiologist (physician) and Advanced Practice Providers (APPs -  Physician Assistants and Nurse Practitioners) who all work together to provide you with the care you need, when you need it.  Your next appointment:   12 month(s)  The format for your next appointment:   In Person  Provider:   Richardson Dopp, PA-C  Other Instructions Check blood pressure for one week and call with readings

## 2020-04-28 ENCOUNTER — Other Ambulatory Visit: Payer: Self-pay | Admitting: Oncology

## 2020-04-30 ENCOUNTER — Ambulatory Visit (HOSPITAL_COMMUNITY)
Admission: RE | Admit: 2020-04-30 | Discharge: 2020-04-30 | Disposition: A | Discharge status: Home / Self Care | Payer: Medicare Other | Source: Ambulatory Visit | Attending: Oncology | Admitting: Oncology

## 2020-04-30 ENCOUNTER — Other Ambulatory Visit: Payer: Self-pay

## 2020-04-30 ENCOUNTER — Other Ambulatory Visit: Payer: Medicare Other

## 2020-04-30 ENCOUNTER — Inpatient Hospital Stay: Payer: Medicare Other

## 2020-04-30 ENCOUNTER — Encounter (HOSPITAL_COMMUNITY): Payer: Self-pay

## 2020-04-30 DIAGNOSIS — C241 Malignant neoplasm of ampulla of Vater: Secondary | ICD-10-CM | POA: Insufficient documentation

## 2020-04-30 DIAGNOSIS — Z5111 Encounter for antineoplastic chemotherapy: Secondary | ICD-10-CM | POA: Diagnosis not present

## 2020-04-30 DIAGNOSIS — Z95828 Presence of other vascular implants and grafts: Secondary | ICD-10-CM

## 2020-04-30 DIAGNOSIS — C801 Malignant (primary) neoplasm, unspecified: Secondary | ICD-10-CM

## 2020-04-30 LAB — CBC WITH DIFFERENTIAL (CANCER CENTER ONLY)
Abs Immature Granulocytes: 0.02 10*3/uL (ref 0.00–0.07)
Basophils Absolute: 0 10*3/uL (ref 0.0–0.1)
Basophils Relative: 0 %
Eosinophils Absolute: 0.1 10*3/uL (ref 0.0–0.5)
Eosinophils Relative: 1 %
HCT: 33.1 % — ABNORMAL LOW (ref 36.0–46.0)
Hemoglobin: 10.7 g/dL — ABNORMAL LOW (ref 12.0–15.0)
Immature Granulocytes: 0 %
Lymphocytes Relative: 26 %
Lymphs Abs: 1.4 10*3/uL (ref 0.7–4.0)
MCH: 30.3 pg (ref 26.0–34.0)
MCHC: 32.3 g/dL (ref 30.0–36.0)
MCV: 93.8 fL (ref 80.0–100.0)
Monocytes Absolute: 0.6 10*3/uL (ref 0.1–1.0)
Monocytes Relative: 10 %
Neutro Abs: 3.4 10*3/uL (ref 1.7–7.7)
Neutrophils Relative %: 63 %
Platelet Count: 213 10*3/uL (ref 150–400)
RBC: 3.53 MIL/uL — ABNORMAL LOW (ref 3.87–5.11)
RDW: 17.2 % — ABNORMAL HIGH (ref 11.5–15.5)
WBC Count: 5.5 10*3/uL (ref 4.0–10.5)
nRBC: 0 % (ref 0.0–0.2)

## 2020-04-30 LAB — CMP (CANCER CENTER ONLY)
ALT: 34 U/L (ref 0–44)
AST: 53 U/L — ABNORMAL HIGH (ref 15–41)
Albumin: 3.2 g/dL — ABNORMAL LOW (ref 3.5–5.0)
Alkaline Phosphatase: 175 U/L — ABNORMAL HIGH (ref 38–126)
Anion gap: 10 (ref 5–15)
BUN: 18 mg/dL (ref 8–23)
CO2: 24 mmol/L (ref 22–32)
Calcium: 8.7 mg/dL — ABNORMAL LOW (ref 8.9–10.3)
Chloride: 108 mmol/L (ref 98–111)
Creatinine: 1.05 mg/dL — ABNORMAL HIGH (ref 0.44–1.00)
GFR, Est AFR Am: 58 mL/min — ABNORMAL LOW (ref >60)
GFR, Estimated: 50 mL/min — ABNORMAL LOW (ref >60)
Glucose, Bld: 141 mg/dL — ABNORMAL HIGH (ref 70–99)
Potassium: 4.1 mmol/L (ref 3.5–5.1)
Sodium: 142 mmol/L (ref 135–145)
Total Bilirubin: 0.5 mg/dL (ref 0.3–1.2)
Total Protein: 7.1 g/dL (ref 6.5–8.1)

## 2020-04-30 MED ORDER — IOHEXOL 300 MG/ML  SOLN
100.0000 mL | Freq: Once | INTRAMUSCULAR | Status: AC | PRN
  Administered 2020-04-30: 100 mL via INTRAVENOUS

## 2020-04-30 MED ORDER — HEPARIN SOD (PORK) LOCK FLUSH 100 UNIT/ML IV SOLN
INTRAVENOUS | Status: AC
  Filled 2020-04-30: qty 5

## 2020-04-30 MED ORDER — SODIUM CHLORIDE 0.9% FLUSH
10.0000 mL | Freq: Once | INTRAVENOUS | Status: AC
  Administered 2020-04-30: 10 mL
  Filled 2020-04-30: qty 10

## 2020-04-30 NOTE — Patient Instructions (Signed)

## 2020-05-02 ENCOUNTER — Inpatient Hospital Stay: Payer: Medicare Other

## 2020-05-02 ENCOUNTER — Other Ambulatory Visit: Payer: Self-pay

## 2020-05-02 ENCOUNTER — Other Ambulatory Visit: Payer: Medicare Other

## 2020-05-02 ENCOUNTER — Inpatient Hospital Stay (HOSPITAL_BASED_OUTPATIENT_CLINIC_OR_DEPARTMENT_OTHER): Payer: Medicare Other | Admitting: Nurse Practitioner

## 2020-05-02 VITALS — BP 153/77 | HR 62 | Temp 97.6°F | Resp 18

## 2020-05-02 DIAGNOSIS — C241 Malignant neoplasm of ampulla of Vater: Secondary | ICD-10-CM | POA: Diagnosis not present

## 2020-05-02 DIAGNOSIS — Z5111 Encounter for antineoplastic chemotherapy: Secondary | ICD-10-CM | POA: Diagnosis not present

## 2020-05-02 MED ORDER — PROCHLORPERAZINE MALEATE 10 MG PO TABS
10.0000 mg | ORAL_TABLET | Freq: Once | ORAL | Status: AC
  Administered 2020-05-02: 10 mg via ORAL

## 2020-05-02 MED ORDER — SODIUM CHLORIDE 0.9 % IV SOLN
Freq: Once | INTRAVENOUS | Status: AC
  Filled 2020-05-02: qty 250

## 2020-05-02 MED ORDER — SODIUM CHLORIDE 0.9 % IV SOLN
800.0000 mg/m2 | Freq: Once | INTRAVENOUS | Status: AC
  Administered 2020-05-02: 1558 mg via INTRAVENOUS
  Filled 2020-05-02: qty 40.98

## 2020-05-02 MED ORDER — PROCHLORPERAZINE MALEATE 10 MG PO TABS
ORAL_TABLET | ORAL | Status: AC
  Filled 2020-05-02: qty 1

## 2020-05-02 MED ORDER — SODIUM CHLORIDE 0.9% FLUSH
10.0000 mL | INTRAVENOUS | Status: DC | PRN
  Administered 2020-05-02: 10 mL
  Filled 2020-05-02: qty 10

## 2020-05-02 MED ORDER — HEPARIN SOD (PORK) LOCK FLUSH 100 UNIT/ML IV SOLN
500.0000 [IU] | Freq: Once | INTRAVENOUS | Status: AC | PRN
  Administered 2020-05-02: 500 [IU]
  Filled 2020-05-02: qty 5

## 2020-05-02 NOTE — Patient Instructions (Signed)
[facility] °Discharge Instructions for Patients Receiving Chemotherapy ° °Today you received the following chemotherapy agents Gemzar ° °To help prevent nausea and vomiting after your treatment, we encourage you to take your nausea medication as directed. °  °If you develop nausea and vomiting that is not controlled by your nausea medication, call the clinic.  ° °BELOW ARE SYMPTOMS THAT SHOULD BE REPORTED IMMEDIATELY: °· *FEVER GREATER THAN 100.5 F °· *CHILLS WITH OR WITHOUT FEVER °· NAUSEA AND VOMITING THAT IS NOT CONTROLLED WITH YOUR NAUSEA MEDICATION °· *UNUSUAL SHORTNESS OF BREATH °· *UNUSUAL BRUISING OR BLEEDING °· TENDERNESS IN MOUTH AND THROAT WITH OR WITHOUT PRESENCE OF ULCERS °· *URINARY PROBLEMS °· *BOWEL PROBLEMS °· UNUSUAL RASH °Items with * indicate a potential emergency and should be followed up as soon as possible. ° °Feel free to call the clinic should you have any questions or concerns. The clinic phone number is [phone]. ° °Please show the CHEMO ALERT CARD at check-in to the Emergency Department and triage nurse. ° ° °

## 2020-05-02 NOTE — Progress Notes (Addendum)
[place] OFFICE PROGRESS NOTE   Diagnosis: Pancreas cancer  INTERVAL HISTORY:   Kaylee Mullins returns as scheduled.  She completed another cycle of gemcitabine 04/18/2020.  She denies nausea/vomiting.  No diarrhea.  No fever.  No rash.  Stable numbness in 3 toes on the right foot.  No numbness in the fingers/hands.  Intermittent pain on the left "side".  She continues to have pain at the low anterior chest.  Objective:  Vital signs in last 24 hours:  Blood pressure (!) 153/77, pulse 62, temperature 97.6 F (36.4 C), temperature source Oral, resp. rate 18, SpO2 100 %.   Resp: Lungs clear bilaterally. Cardio: Regular rate and rhythm. GI: No hepatomegaly.  No mass. Vascular: 1+ edema lower legs bilaterally. Musculoskeletal: Tender over the lower sternum/ribs. Port-A-Cath without erythema.  Lab Results:  Lab Results  Component Value Date   WBC 5.5 04/30/2020   HGB 10.7 (L) 04/30/2020   HCT 33.1 (L) 04/30/2020   MCV 93.8 04/30/2020   PLT 213 04/30/2020   NEUTROABS 3.4 04/30/2020    Imaging:  CT CHEST W CONTRAST  Result Date: 05/01/2020 CLINICAL DATA:  84 year old female with history of pancreatic cancer. Restaging examination. EXAM: CT CHEST, ABDOMEN, AND PELVIS WITH CONTRAST TECHNIQUE: Multidetector CT imaging of the chest, abdomen and pelvis was performed following the standard protocol during bolus administration of intravenous contrast. CONTRAST:  112mL OMNIPAQUE IOHEXOL 300 MG/ML  SOLN COMPARISON:  CT the chest, abdomen and pelvis 01/30/2020. FINDINGS: CT CHEST FINDINGS Cardiovascular: Heart size is normal. There is no significant pericardial fluid, thickening or pericardial calcification. There is aortic atherosclerosis, as well as atherosclerosis of the great vessels of the mediastinum and the coronary arteries, including calcified atherosclerotic plaque in the left main, left anterior descending, left circumflex and right coronary arteries. Severe thickening  calcification of the aortic valve. Right internal jugular single-lumen porta cath with tip terminating in the right atrium. Mediastinum/Nodes: No pathologically enlarged mediastinal or hilar lymph nodes. Esophagus is unremarkable in appearance. No axillary lymphadenopathy. Lungs/Pleura: Previously noted pleural based mass-like lesion in the posterior aspect of the right lower lobe (axial image 39 of series 2) currently measures 3.3 x 2.1 cm, slowly increasing in size over numerous prior examinations. 4 mm enhancing pleural nodule in the posterior aspect of the right lower lobe (axial image 34 of series 2). 5 mm nodule in the superior aspect of the right middle lobe (axial image 73 of series 4), stable. Other smaller 2-3 mm pulmonary nodules are unchanged. Small right pleural effusion lying dependently. No acute consolidative airspace disease. No pleural effusions. Musculoskeletal: Lucent lesion with vertical trabeculations in the right-side of the T10 vertebral body, stable compared to numerous prior examinations, presumably a cavernous hemangioma. No other definite lytic or blastic lesions are noted in the visualized portions of the skeleton. CT ABDOMEN PELVIS FINDINGS Hepatobiliary: Multiple hypovascular hepatic lesions are again noted. The largest of these are as follows. 3.0 x 2.2 cm lesion in between segments 5 and 8 (axial image 51 of series 2), previously 5.2 x 2.5 x 4.4 cm). 2.9 x 2.6 cm lesion in segment 6 (axial image 59 of series 2), previously 3.2 x 2.0 cm when measured on image 98 of series 7 of the prior examination). 2.1 x 2.2 cm lesion in segment 5 (axial image 60 of series 2), previously 2.6 x 1.8 cm. No definite new hepatic lesions are otherwise noted. No intra or extrahepatic biliary ductal dilatation. Status post cholecystectomy. Small amount of pneumobilia. Liver  has a slightly nodular contour, suggesting underlying cirrhosis. Pancreas: Postoperative changes of Whipple procedure. No discrete  mass confidently identified in the remaining body and tail of the pancreas. No pancreatic ductal dilatation. No peripancreatic fluid collections or inflammatory changes. Spleen: Unremarkable. Adrenals/Urinary Tract: Bilateral kidneys and adrenal glands are normal in appearance. No hydroureteronephrosis. Urinary bladder is nearly decompressed, but otherwise unremarkable in appearance. Stomach/Bowel: Postoperative changes of Whipple procedure. Stomach is otherwise unremarkable in appearance. No pathologic dilatation of small bowel or colon. A few scattered colonic diverticulae are noted, without surrounding inflammatory changes to suggest an acute diverticulitis at this time. Normal appendix. Vascular/Lymphatic: Aortic atherosclerosis, without evidence of aneurysm or dissection in the abdominal or pelvic vasculature. IVC filter in position with tip terminating below the level of the renal veins. Amorphous nodal mass adjacent to the left renal hilum in the left para-aortic nodal station measuring 3.2 x 1.9 cm (axial image 59 of series 2), similar to the prior study. No other lymphadenopathy confidently identified in the abdomen or pelvis. Reproductive: Status post hysterectomy.  Ovaries are trophic. Other: No significant volume of ascites.  No pneumoperitoneum. Musculoskeletal: Interbody cages at the L4-L5 interspace. There are no aggressive appearing lytic or blastic lesions noted in the visualized portions of the skeleton. IMPRESSION: 1. Today's study demonstrates a generally positive response to therapy with slight regression of multiple hepatic metastases, as detailed above. No new hepatic lesions are noted. 2. Previously noted left para-aortic lymphadenopathy is stable compared to prior examinations. 3. Slow growth of a pleural-based mass-like area in the posteromedial aspect of the right lower lobe over several prior examinations. This may simply reflect rounded atelectasis, however, close attention on follow-up  imaging is recommended to ensure continued stability. 4. Aortic atherosclerosis, in addition to left main and 3 vessel coronary artery disease. 5. There are calcifications of the aortic valve. Echocardiographic correlation for evaluation of potential valvular dysfunction may be warranted if clinically indicated. 6. Additional incidental findings, as above. Electronically Signed   By: Vinnie Langton M.D.   On: 05/01/2020 08:22   CT ABDOMEN PELVIS W CONTRAST  Result Date: 05/01/2020 CLINICAL DATA:  84 year old female with history of pancreatic cancer. Restaging examination. EXAM: CT CHEST, ABDOMEN, AND PELVIS WITH CONTRAST TECHNIQUE: Multidetector CT imaging of the chest, abdomen and pelvis was performed following the standard protocol during bolus administration of intravenous contrast. CONTRAST:  181mL OMNIPAQUE IOHEXOL 300 MG/ML  SOLN COMPARISON:  CT the chest, abdomen and pelvis 01/30/2020. FINDINGS: CT CHEST FINDINGS Cardiovascular: Heart size is normal. There is no significant pericardial fluid, thickening or pericardial calcification. There is aortic atherosclerosis, as well as atherosclerosis of the great vessels of the mediastinum and the coronary arteries, including calcified atherosclerotic plaque in the left main, left anterior descending, left circumflex and right coronary arteries. Severe thickening calcification of the aortic valve. Right internal jugular single-lumen porta cath with tip terminating in the right atrium. Mediastinum/Nodes: No pathologically enlarged mediastinal or hilar lymph nodes. Esophagus is unremarkable in appearance. No axillary lymphadenopathy. Lungs/Pleura: Previously noted pleural based mass-like lesion in the posterior aspect of the right lower lobe (axial image 39 of series 2) currently measures 3.3 x 2.1 cm, slowly increasing in size over numerous prior examinations. 4 mm enhancing pleural nodule in the posterior aspect of the right lower lobe (axial image 34 of series  2). 5 mm nodule in the superior aspect of the right middle lobe (axial image 73 of series 4), stable. Other smaller 2-3 mm pulmonary nodules are unchanged. Small  right pleural effusion lying dependently. No acute consolidative airspace disease. No pleural effusions. Musculoskeletal: Lucent lesion with vertical trabeculations in the right-side of the T10 vertebral body, stable compared to numerous prior examinations, presumably a cavernous hemangioma. No other definite lytic or blastic lesions are noted in the visualized portions of the skeleton. CT ABDOMEN PELVIS FINDINGS Hepatobiliary: Multiple hypovascular hepatic lesions are again noted. The largest of these are as follows. 3.0 x 2.2 cm lesion in between segments 5 and 8 (axial image 51 of series 2), previously 5.2 x 2.5 x 4.4 cm). 2.9 x 2.6 cm lesion in segment 6 (axial image 59 of series 2), previously 3.2 x 2.0 cm when measured on image 98 of series 7 of the prior examination). 2.1 x 2.2 cm lesion in segment 5 (axial image 60 of series 2), previously 2.6 x 1.8 cm. No definite new hepatic lesions are otherwise noted. No intra or extrahepatic biliary ductal dilatation. Status post cholecystectomy. Small amount of pneumobilia. Liver has a slightly nodular contour, suggesting underlying cirrhosis. Pancreas: Postoperative changes of Whipple procedure. No discrete mass confidently identified in the remaining body and tail of the pancreas. No pancreatic ductal dilatation. No peripancreatic fluid collections or inflammatory changes. Spleen: Unremarkable. Adrenals/Urinary Tract: Bilateral kidneys and adrenal glands are normal in appearance. No hydroureteronephrosis. Urinary bladder is nearly decompressed, but otherwise unremarkable in appearance. Stomach/Bowel: Postoperative changes of Whipple procedure. Stomach is otherwise unremarkable in appearance. No pathologic dilatation of small bowel or colon. A few scattered colonic diverticulae are noted, without surrounding  inflammatory changes to suggest an acute diverticulitis at this time. Normal appendix. Vascular/Lymphatic: Aortic atherosclerosis, without evidence of aneurysm or dissection in the abdominal or pelvic vasculature. IVC filter in position with tip terminating below the level of the renal veins. Amorphous nodal mass adjacent to the left renal hilum in the left para-aortic nodal station measuring 3.2 x 1.9 cm (axial image 59 of series 2), similar to the prior study. No other lymphadenopathy confidently identified in the abdomen or pelvis. Reproductive: Status post hysterectomy.  Ovaries are trophic. Other: No significant volume of ascites.  No pneumoperitoneum. Musculoskeletal: Interbody cages at the L4-L5 interspace. There are no aggressive appearing lytic or blastic lesions noted in the visualized portions of the skeleton. IMPRESSION: 1. Today's study demonstrates a generally positive response to therapy with slight regression of multiple hepatic metastases, as detailed above. No new hepatic lesions are noted. 2. Previously noted left para-aortic lymphadenopathy is stable compared to prior examinations. 3. Slow growth of a pleural-based mass-like area in the posteromedial aspect of the right lower lobe over several prior examinations. This may simply reflect rounded atelectasis, however, close attention on follow-up imaging is recommended to ensure continued stability. 4. Aortic atherosclerosis, in addition to left main and 3 vessel coronary artery disease. 5. There are calcifications of the aortic valve. Echocardiographic correlation for evaluation of potential valvular dysfunction may be warranted if clinically indicated. 6. Additional incidental findings, as above. Electronically Signed   By: Vinnie Langton M.D.   On: 05/01/2020 08:22    Medications: I have reviewed the patient's current medications.  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 CT abdomen/pelvis 10/23/2016-ampullary mass, single mildly enlarged porta hepatis lymph node, no evidence of distant metastatic disease ? Status post pancreaticoduodenectomy 01/12/2017; pT3pN2 ? CT abdomen/pelvis 03/30/2018-ablation defect within the upper pole of the right kidney. No evidence of recurrent renal mass. New mild left periaortic retroperitoneal  lymphadenopathy. ? CT abdomen/pelvis 06/30/2018-6 mm right middle lobe nodule-slowly growing over multiple CTs, stable ablation defect in the right kidney, 1.9 cm left periaortic node increased from 1.2 cm, stable 1.5 cm porta hepatis node ? CT abdomen/pelvis 09/17/2018-surgical changes related to Whipple procedure. Stable upper abdominal/retroperitoneal lymphadenopathy. 5 mm right middle lobe nodule, stable from most recent CT but mildly progressive from priors. Postprocedural changes related to renal ablation in the medial right upper kidney. ? CTs 03/24/2019-a few scattered pulmonary nodules appears stable; liver has a slightly shrunken appearance and nodular appearance compatible with mild cirrhosis. No suspicious cystic or solid hepatic lesions. Upper abdominal and retroperitoneal lymphadenopathy appears similar compared to the prior study. No other new lymphadenopathy noted elsewhere in the abdomen or pelvis. ? PET scan 07/03/2019-hypermetabolic lymph node adjacent porta hepatis. Newfocal hypermetabolic lesion in the liver. Hypermetabolic activity in the left suprarenal location favored metastatic adenopathy in the retroperitoneum. Diffuse hypermetabolic activity within the stomach favor gastritis. ? Cycle 1 gemcitabine/Abraxane 08/31/2019 ? Cycle 2 gemcitabine/Abraxane 09/14/2019 ? Cycle 3 gemcitabine/Abraxane 09/29/2019 ? Cycle 4 gemcitabine/Abraxane 10/13/2019 ? Cycle 5 gemcitabine/Abraxane 10/27/2019 ? Cycle 6 gemcitabine/Abraxane 11/09/2019 ? CTs 11/13/2019-new 2 mm right apical and 3 mm left  lower lobe nodules, stable portacaval node, enlargement of amorphous soft tissue encasing the left renal artery, stable liver lesion ? Cycle 7 gemcitabine/Abraxane 11/24/2019 ? Cycle 8 gemcitabine/Abraxane 12/07/2019 ? 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 Cycle 10 gemcitabine/Abraxane 01/05/2020 ? Cycle 11 gemcitabine/Abraxane 01/18/2020 ? CTs 01/30/2020-small right pleural effusion. Rounded peripheral mass along the margin of the right lower lobe posteriorly adjacent to the pleural effusion 3.0 x 1.9 cm compared to previous measurement of 3.0 x 1.9 cm. Stable 4 mm right upper lobe nodule. Stable 0.6 x 0.5 cm right middle lobe nodule. Stable 3 mm nodule in the superior segment left lower lobe. No new nodules identified. Reduced conspicuity of margins of the moderately heterogeneous anterior liver lesion measuring 5.2 x 2.5 x 4.4 cm, previous measurement 3.0 x 2.4 x 3.4 cm. Nodular contour of the liver. Region of hypodensity in segment 5 measuring 2.6 x 1.8 cm, previously 2.0 x 1.3 cm. Portacaval node 1.2 cm, previously 1.3 cm. Porta hepatis node 0.9 cm, formerly 1.3 cm. Abnormal indistinctly marginated soft tissue density probably reflecting nodal tissue surrounding the left proximal renal artery measuring 1.8 cm, formerly 1.9 cm. ? Cycle 12 gemcitabine/Abraxane 02/01/2020 ? Cycle13gemcitabine/Abraxane 02/16/2020 ? Cycle 14 gemcitabine/Abraxane 02/29/2020 ? 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 Cycle 16 gemcitabine 04/05/2020 (Abraxane held due to neuropathy) ? Cycle 17 gemcitabine 04/18/2020 (Abraxane held due to neuropathy) ? CTs 04/30/2020-previously noted pleural-based masslike lesion in the posterior aspect of the right lower lobe with slow increase in size over numerous prior examinations.  Slight regression of multiple liver metastases.  No new liver lesions noted.  Stable left para-aortic lymphadenopathy. ? Continue every 2-week  gemcitabine ? 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 Biliary obstruction secondary to #1, status post placement of a metal, bile duct stent on 10/23/2016  3. 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 Renal cell carcinoma-status post ablation of a right renal mass 07/15/2016, biopsy confirmed papillary renal cell carcinoma, Fuhrman grade 3  5. CVA in 2016  6. History of thyroid cancer-status post thyroidectomy and radioactive iodine in 2005  7. Diabetes  8.Hypertension  9.Left lower extremity deep vein thrombosis June 2019, IVC filter placed July 2018 after she was diagnosed with  a rectus hematoma while on Lovenox  10. Hospitalized 04/12/2019 through 04/14/2019 with traumatic hematoma right lower leg, followed at the wound clinic  11.Port-A-Cath placement interventional radiology 08/29/2019  12.Covid vaccine-second injection administered 11/10/2019; booster 04/05/2020    Disposition: Ms. Bradway appears unchanged.  Recent restaging CT scans show improvement in liver metastases, stable disease otherwise.  Plan to continue every 2-week gemcitabine with the plan to resume Abraxane if there is consistent increase in the CA 19-9 tumor marker.  We reviewed the CBC and chemistry panel from 04/30/2020.  Labs adequate to proceed with treatment.  Cycle 18 gemcitabine today.  Lab, follow-up, gemcitabine in 2 weeks.  She will contact the office in the interim with any problems.  Patient seen with Dr. Benay Spice.    [name] ANP/GNP-BC   05/02/2020  1:02 PM This was a shared visit with [name].  The restaging CTs show improvement in liver metastases and otherwise stable disease.  I reviewed the CT images.  The plan is to continue single agent Abraxane with close clinical and CA 19-9 follow-up.  [name], MD

## 2020-05-02 NOTE — Patient Instructions (Signed)

## 2020-05-03 ENCOUNTER — Telehealth: Payer: Self-pay | Admitting: Nurse Practitioner

## 2020-05-03 NOTE — Telephone Encounter (Signed)
Scheduled appointment per 9/16 los. Attempted to call patient using both phone numbers provided. No answer and no voicemail available on either number. Will try to call again later. I will also mail patient updated calendar with appointments.

## 2020-05-07 ENCOUNTER — Telehealth: Payer: Self-pay | Admitting: *Deleted

## 2020-05-07 NOTE — Telephone Encounter (Signed)
Left message that her transportation says the appointments 10/14 are too early in day for them. Requesting to move her appointments to later in day after 11:00 am. Scheduling message sent to scheduler w/request.

## 2020-05-12 ENCOUNTER — Other Ambulatory Visit: Payer: Self-pay | Admitting: Oncology

## 2020-05-16 ENCOUNTER — Other Ambulatory Visit: Payer: Self-pay

## 2020-05-16 ENCOUNTER — Inpatient Hospital Stay: Payer: Medicare Other

## 2020-05-16 ENCOUNTER — Encounter: Payer: Self-pay | Admitting: Nurse Practitioner

## 2020-05-16 ENCOUNTER — Inpatient Hospital Stay (HOSPITAL_BASED_OUTPATIENT_CLINIC_OR_DEPARTMENT_OTHER): Payer: Medicare Other | Admitting: Nurse Practitioner

## 2020-05-16 VITALS — BP 139/62 | HR 59 | Temp 97.4°F | Resp 18 | Ht 64.0 in | Wt 178.2 lb

## 2020-05-16 DIAGNOSIS — C241 Malignant neoplasm of ampulla of Vater: Secondary | ICD-10-CM | POA: Diagnosis not present

## 2020-05-16 DIAGNOSIS — C801 Malignant (primary) neoplasm, unspecified: Secondary | ICD-10-CM

## 2020-05-16 DIAGNOSIS — Z5111 Encounter for antineoplastic chemotherapy: Secondary | ICD-10-CM | POA: Diagnosis not present

## 2020-05-16 DIAGNOSIS — Z95828 Presence of other vascular implants and grafts: Secondary | ICD-10-CM

## 2020-05-16 LAB — CBC WITH DIFFERENTIAL (CANCER CENTER ONLY)
Abs Immature Granulocytes: 0.02 10*3/uL (ref 0.00–0.07)
Basophils Absolute: 0 10*3/uL (ref 0.0–0.1)
Basophils Relative: 0 %
Eosinophils Absolute: 0 10*3/uL (ref 0.0–0.5)
Eosinophils Relative: 1 %
HCT: 35 % — ABNORMAL LOW (ref 36.0–46.0)
Hemoglobin: 11.1 g/dL — ABNORMAL LOW (ref 12.0–15.0)
Immature Granulocytes: 0 %
Lymphocytes Relative: 23 %
Lymphs Abs: 1.3 10*3/uL (ref 0.7–4.0)
MCH: 30.2 pg (ref 26.0–34.0)
MCHC: 31.7 g/dL (ref 30.0–36.0)
MCV: 95.1 fL (ref 80.0–100.0)
Monocytes Absolute: 0.5 10*3/uL (ref 0.1–1.0)
Monocytes Relative: 8 %
Neutro Abs: 4 10*3/uL (ref 1.7–7.7)
Neutrophils Relative %: 68 %
Platelet Count: 212 10*3/uL (ref 150–400)
RBC: 3.68 MIL/uL — ABNORMAL LOW (ref 3.87–5.11)
RDW: 17.1 % — ABNORMAL HIGH (ref 11.5–15.5)
WBC Count: 5.9 10*3/uL (ref 4.0–10.5)
nRBC: 0 % (ref 0.0–0.2)

## 2020-05-16 LAB — CMP (CANCER CENTER ONLY)
ALT: 30 U/L (ref 0–44)
AST: 38 U/L (ref 15–41)
Albumin: 3.3 g/dL — ABNORMAL LOW (ref 3.5–5.0)
Alkaline Phosphatase: 200 U/L — ABNORMAL HIGH (ref 38–126)
Anion gap: 6 (ref 5–15)
BUN: 26 mg/dL — ABNORMAL HIGH (ref 8–23)
CO2: 27 mmol/L (ref 22–32)
Calcium: 8.6 mg/dL — ABNORMAL LOW (ref 8.9–10.3)
Chloride: 108 mmol/L (ref 98–111)
Creatinine: 0.99 mg/dL (ref 0.44–1.00)
GFR, Est AFR Am: >60 mL/min (ref >60)
GFR, Estimated: 53 mL/min — ABNORMAL LOW (ref >60)
Glucose, Bld: 117 mg/dL — ABNORMAL HIGH (ref 70–99)
Potassium: 4.1 mmol/L (ref 3.5–5.1)
Sodium: 141 mmol/L (ref 135–145)
Total Bilirubin: 0.8 mg/dL (ref 0.3–1.2)
Total Protein: 7.1 g/dL (ref 6.5–8.1)

## 2020-05-16 MED ORDER — PROCHLORPERAZINE MALEATE 10 MG PO TABS
10.0000 mg | ORAL_TABLET | Freq: Once | ORAL | Status: AC
  Administered 2020-05-16: 10 mg via ORAL

## 2020-05-16 MED ORDER — SODIUM CHLORIDE 0.9% FLUSH
10.0000 mL | Freq: Once | INTRAVENOUS | Status: AC
  Administered 2020-05-16: 10 mL
  Filled 2020-05-16: qty 10

## 2020-05-16 MED ORDER — SODIUM CHLORIDE 0.9% FLUSH
10.0000 mL | INTRAVENOUS | Status: DC | PRN
  Administered 2020-05-16: 10 mL
  Filled 2020-05-16: qty 10

## 2020-05-16 MED ORDER — PROCHLORPERAZINE MALEATE 10 MG PO TABS
ORAL_TABLET | ORAL | Status: AC
  Filled 2020-05-16: qty 1

## 2020-05-16 MED ORDER — HEPARIN SOD (PORK) LOCK FLUSH 100 UNIT/ML IV SOLN
500.0000 [IU] | Freq: Once | INTRAVENOUS | Status: AC | PRN
  Administered 2020-05-16: 500 [IU]
  Filled 2020-05-16: qty 5

## 2020-05-16 MED ORDER — HEPARIN SOD (PORK) LOCK FLUSH 100 UNIT/ML IV SOLN
500.0000 [IU] | Freq: Once | INTRAVENOUS | Status: AC
  Administered 2020-05-16: 500 [IU]
  Filled 2020-05-16: qty 5

## 2020-05-16 MED ORDER — SODIUM CHLORIDE 0.9 % IV SOLN
Freq: Once | INTRAVENOUS | Status: AC
  Filled 2020-05-16: qty 250

## 2020-05-16 MED ORDER — SODIUM CHLORIDE 0.9 % IV SOLN
800.0000 mg/m2 | Freq: Once | INTRAVENOUS | Status: AC
  Administered 2020-05-16: 1558 mg via INTRAVENOUS
  Filled 2020-05-16: qty 40.98

## 2020-05-16 NOTE — Patient Instructions (Signed)
[facility] °Discharge Instructions for Patients Receiving Chemotherapy ° °Today you received the following chemotherapy agents Gemzar ° °To help prevent nausea and vomiting after your treatment, we encourage you to take your nausea medication as directed. °  °If you develop nausea and vomiting that is not controlled by your nausea medication, call the clinic.  ° °BELOW ARE SYMPTOMS THAT SHOULD BE REPORTED IMMEDIATELY: °· *FEVER GREATER THAN 100.5 F °· *CHILLS WITH OR WITHOUT FEVER °· NAUSEA AND VOMITING THAT IS NOT CONTROLLED WITH YOUR NAUSEA MEDICATION °· *UNUSUAL SHORTNESS OF BREATH °· *UNUSUAL BRUISING OR BLEEDING °· TENDERNESS IN MOUTH AND THROAT WITH OR WITHOUT PRESENCE OF ULCERS °· *URINARY PROBLEMS °· *BOWEL PROBLEMS °· UNUSUAL RASH °Items with * indicate a potential emergency and should be followed up as soon as possible. ° °Feel free to call the clinic should you have any questions or concerns. The clinic phone number is [phone]. ° °Please show the CHEMO ALERT CARD at check-in to the Emergency Department and triage nurse. ° ° °

## 2020-05-16 NOTE — Progress Notes (Signed)
[place] OFFICE PROGRESS NOTE   Diagnosis: Ampullary cancer  INTERVAL HISTORY:   Kaylee Mullins returns as scheduled.  She completed another cycle of gemcitabine 05/02/2020.  He denies nausea/vomiting.  No constipation or diarrhea.  Stable numbness right first 3 toes.  She has occasional pain left foot.  No fever or rash.  Intermittent pain left lateral abdomen.  Objective:  Vital signs in last 24 hours:  Blood pressure 139/62, pulse (!) 59, temperature (!) 97.4 F (36.3 C), temperature source Tympanic, resp. rate 18, height 5\' 4"  (1.626 m), weight 178 lb 3.2 oz (80.8 kg), SpO2 100 %.    HEENT: No thrush or ulcers. Resp: Lungs clear bilaterally. Cardio: Regular rate and rhythm. GI: Abdomen soft and nontender.  No hepatomegaly. Vascular: 1+ edema lower legs bilaterally. Port-A-Cath without erythema.  Lab Results:  Lab Results  Component Value Date   WBC 5.9 05/16/2020   HGB 11.1 (L) 05/16/2020   HCT 35.0 (L) 05/16/2020   MCV 95.1 05/16/2020   PLT 212 05/16/2020   NEUTROABS 4.0 05/16/2020    Imaging:  No results found.  Medications: I have reviewed the patient's current medications.  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 CT abdomen/pelvis 10/23/2016-ampullary mass, single mildly enlarged porta hepatis lymph node, no evidence of distant metastatic disease ? Status post pancreaticoduodenectomy 01/12/2017; pT3pN2 ? CT abdomen/pelvis 03/30/2018-ablation defect within the upper pole of the right kidney. No evidence of recurrent renal mass. New mild left periaortic retroperitoneal lymphadenopathy. ? CT abdomen/pelvis 06/30/2018-6 mm right middle lobe nodule-slowly growing over multiple CTs, stable ablation defect in the right kidney, 1.9 cm left periaortic node increased from 1.2 cm, stable 1.5 cm porta hepatis node ? CT abdomen/pelvis  09/17/2018-surgical changes related to Whipple procedure. Stable upper abdominal/retroperitoneal lymphadenopathy. 5 mm right middle lobe nodule, stable from most recent CT but mildly progressive from priors. Postprocedural changes related to renal ablation in the medial right upper kidney. ? CTs 03/24/2019-a few scattered pulmonary nodules appears stable; liver has a slightly shrunken appearance and nodular appearance compatible with mild cirrhosis. No suspicious cystic or solid hepatic lesions. Upper abdominal and retroperitoneal lymphadenopathy appears similar compared to the prior study. No other new lymphadenopathy noted elsewhere in the abdomen or pelvis. ? PET scan 07/03/2019-hypermetabolic lymph node adjacent porta hepatis. Newfocal hypermetabolic lesion in the liver. Hypermetabolic activity in the left suprarenal location favored metastatic adenopathy in the retroperitoneum. Diffuse hypermetabolic activity within the stomach favor gastritis. ? Cycle 1 gemcitabine/Abraxane 08/31/2019 ? Cycle 2 gemcitabine/Abraxane 09/14/2019 ? Cycle 3 gemcitabine/Abraxane 09/29/2019 ? Cycle 4 gemcitabine/Abraxane 10/13/2019 ? Cycle 5 gemcitabine/Abraxane 10/27/2019 ? Cycle 6 gemcitabine/Abraxane 11/09/2019 ? CTs 11/13/2019-new 2 mm right apical and 3 mm left lower lobe nodules, stable portacaval node, enlargement of amorphous soft tissue encasing the left renal artery, stable liver lesion ? Cycle 7 gemcitabine/Abraxane 11/24/2019 ? Cycle 8 gemcitabine/Abraxane 12/07/2019 ? 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 Cycle 10 gemcitabine/Abraxane 01/05/2020 ? Cycle 11 gemcitabine/Abraxane 01/18/2020 ? CTs 01/30/2020-small right pleural effusion. Rounded peripheral mass along the margin of the right lower lobe posteriorly adjacent to the pleural effusion 3.0 x 1.9 cm compared to previous measurement of 3.0 x 1.9 cm. Stable 4 mm right upper lobe nodule. Stable 0.6 x 0.5 cm right middle lobe  nodule. Stable 3 mm nodule in the superior segment left lower lobe. No new nodules identified. Reduced conspicuity of margins of the moderately  heterogeneous anterior liver lesion measuring 5.2 x 2.5 x 4.4 cm, previous measurement 3.0 x 2.4 x 3.4 cm. Nodular contour of the liver. Region of hypodensity in segment 5 measuring 2.6 x 1.8 cm, previously 2.0 x 1.3 cm. Portacaval node 1.2 cm, previously 1.3 cm. Porta hepatis node 0.9 cm, formerly 1.3 cm. Abnormal indistinctly marginated soft tissue density probably reflecting nodal tissue surrounding the left proximal renal artery measuring 1.8 cm, formerly 1.9 cm. ? Cycle 12 gemcitabine/Abraxane 02/01/2020 ? Cycle13gemcitabine/Abraxane 02/16/2020 ? Cycle 14 gemcitabine/Abraxane 02/29/2020 ? 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 Cycle 16 gemcitabine 04/05/2020 (Abraxane held due to neuropathy) ? Cycle 17 gemcitabine 04/18/2020 (Abraxane held due to neuropathy) ? CTs 04/30/2020-previously noted pleural-based masslike lesion in the posterior aspect of the right lower lobe with slow increase in size over numerous prior examinations.  Slight regression of multiple liver metastases.  No new liver lesions noted.  Stable left para-aortic lymphadenopathy. ? Continue every 2-week gemcitabine ? Cycle 18 gemcitabine 05/02/2020 (Abraxane held due to neuropathy) ? Cycle 19 gemcitabine 05/16/2020 2. Biliary obstruction secondary to #1, status post placement of a metal, bile duct stent on 10/23/2016  3. 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 Renal cell carcinoma-status post ablation of a right renal mass 07/15/2016, biopsy confirmed papillary renal cell carcinoma, Fuhrman grade 3  5. CVA in 2016  6. History of thyroid cancer-status post thyroidectomy and radioactive iodine in 2005  7. Diabetes  8.Hypertension  9.Left lower extremity deep vein thrombosis June 2019, IVC filter placed  July 2018 after she was diagnosed with a rectus hematoma while on Lovenox  10. Hospitalized 04/12/2019 through 04/14/2019 with traumatic hematoma right lower leg, followed at the wound clinic  11.Port-A-Cath placement interventional radiology 08/29/2019  12.Covid vaccine-second injection administered 11/10/2019; booster 04/05/2020  Disposition: Kaylee Mullins appears unchanged.  There is no clinical evidence of disease progression.  Plan to continue every 2-week gemcitabine.  We reviewed the CBC from today.  Counts adequate to proceed with treatment.  She will return for lab, follow-up, gemcitabine in 2 weeks.  She will contact the office in the interim with any problems.    [name] ANP/GNP-BC   05/16/2020  1:56 PM

## 2020-05-17 ENCOUNTER — Telehealth: Payer: Self-pay | Admitting: Nurse Practitioner

## 2020-05-17 LAB — CANCER ANTIGEN 19-9: CA 19-9: 4701 U/mL — ABNORMAL HIGH (ref 0–35)

## 2020-05-17 NOTE — Telephone Encounter (Signed)
Scheduled appointments per 9/30 los. Spoke to patient who is aware of appointments dates and times.

## 2020-05-25 ENCOUNTER — Other Ambulatory Visit: Payer: Self-pay | Admitting: Oncology

## 2020-05-30 ENCOUNTER — Other Ambulatory Visit: Payer: Self-pay

## 2020-05-30 ENCOUNTER — Ambulatory Visit: Payer: Medicare Other | Admitting: Oncology

## 2020-05-30 ENCOUNTER — Ambulatory Visit: Payer: Medicare Other

## 2020-05-30 ENCOUNTER — Other Ambulatory Visit: Payer: Medicare Other

## 2020-05-30 ENCOUNTER — Inpatient Hospital Stay: Payer: Medicare Other

## 2020-05-30 ENCOUNTER — Inpatient Hospital Stay (HOSPITAL_BASED_OUTPATIENT_CLINIC_OR_DEPARTMENT_OTHER): Payer: Medicare Other | Admitting: Nurse Practitioner

## 2020-05-30 ENCOUNTER — Inpatient Hospital Stay: Payer: Medicare Other | Attending: Internal Medicine

## 2020-05-30 VITALS — BP 135/64 | HR 55 | Temp 97.0°F | Resp 17 | Ht 64.0 in | Wt 180.6 lb

## 2020-05-30 DIAGNOSIS — Z85850 Personal history of malignant neoplasm of thyroid: Secondary | ICD-10-CM | POA: Diagnosis not present

## 2020-05-30 DIAGNOSIS — E1140 Type 2 diabetes mellitus with diabetic neuropathy, unspecified: Secondary | ICD-10-CM | POA: Insufficient documentation

## 2020-05-30 DIAGNOSIS — R600 Localized edema: Secondary | ICD-10-CM | POA: Diagnosis not present

## 2020-05-30 DIAGNOSIS — I10 Essential (primary) hypertension: Secondary | ICD-10-CM | POA: Insufficient documentation

## 2020-05-30 DIAGNOSIS — C241 Malignant neoplasm of ampulla of Vater: Secondary | ICD-10-CM

## 2020-05-30 DIAGNOSIS — G620 Drug-induced polyneuropathy: Secondary | ICD-10-CM | POA: Diagnosis not present

## 2020-05-30 DIAGNOSIS — Z5111 Encounter for antineoplastic chemotherapy: Secondary | ICD-10-CM | POA: Insufficient documentation

## 2020-05-30 DIAGNOSIS — Z86718 Personal history of other venous thrombosis and embolism: Secondary | ICD-10-CM | POA: Insufficient documentation

## 2020-05-30 DIAGNOSIS — E876 Hypokalemia: Secondary | ICD-10-CM | POA: Diagnosis not present

## 2020-05-30 DIAGNOSIS — G893 Neoplasm related pain (acute) (chronic): Secondary | ICD-10-CM | POA: Insufficient documentation

## 2020-05-30 DIAGNOSIS — Z85528 Personal history of other malignant neoplasm of kidney: Secondary | ICD-10-CM | POA: Insufficient documentation

## 2020-05-30 LAB — CBC WITH DIFFERENTIAL (CANCER CENTER ONLY)
Abs Immature Granulocytes: 0.02 10*3/uL (ref 0.00–0.07)
Basophils Absolute: 0 10*3/uL (ref 0.0–0.1)
Basophils Relative: 0 %
Eosinophils Absolute: 0 10*3/uL (ref 0.0–0.5)
Eosinophils Relative: 1 %
HCT: 33.6 % — ABNORMAL LOW (ref 36.0–46.0)
Hemoglobin: 10.9 g/dL — ABNORMAL LOW (ref 12.0–15.0)
Immature Granulocytes: 0 %
Lymphocytes Relative: 29 %
Lymphs Abs: 1.4 10*3/uL (ref 0.7–4.0)
MCH: 30.2 pg (ref 26.0–34.0)
MCHC: 32.4 g/dL (ref 30.0–36.0)
MCV: 93.1 fL (ref 80.0–100.0)
Monocytes Absolute: 0.5 10*3/uL (ref 0.1–1.0)
Monocytes Relative: 10 %
Neutro Abs: 2.8 10*3/uL (ref 1.7–7.7)
Neutrophils Relative %: 60 %
Platelet Count: 221 10*3/uL (ref 150–400)
RBC: 3.61 MIL/uL — ABNORMAL LOW (ref 3.87–5.11)
RDW: 17.2 % — ABNORMAL HIGH (ref 11.5–15.5)
WBC Count: 4.7 10*3/uL (ref 4.0–10.5)
nRBC: 0 % (ref 0.0–0.2)

## 2020-05-30 LAB — CMP (CANCER CENTER ONLY)
ALT: 49 U/L — ABNORMAL HIGH (ref 0–44)
AST: 91 U/L — ABNORMAL HIGH (ref 15–41)
Albumin: 3.2 g/dL — ABNORMAL LOW (ref 3.5–5.0)
Alkaline Phosphatase: 245 U/L — ABNORMAL HIGH (ref 38–126)
Anion gap: 7 (ref 5–15)
BUN: 20 mg/dL (ref 8–23)
CO2: 28 mmol/L (ref 22–32)
Calcium: 8.5 mg/dL — ABNORMAL LOW (ref 8.9–10.3)
Chloride: 108 mmol/L (ref 98–111)
Creatinine: 0.92 mg/dL (ref 0.44–1.00)
GFR, Estimated: 58 mL/min — ABNORMAL LOW (ref >60)
Glucose, Bld: 110 mg/dL — ABNORMAL HIGH (ref 70–99)
Potassium: 3.1 mmol/L — ABNORMAL LOW (ref 3.5–5.1)
Sodium: 143 mmol/L (ref 135–145)
Total Bilirubin: 0.6 mg/dL (ref 0.3–1.2)
Total Protein: 6.7 g/dL (ref 6.5–8.1)

## 2020-05-30 MED ORDER — SODIUM CHLORIDE 0.9 % IV SOLN
800.0000 mg/m2 | Freq: Once | INTRAVENOUS | Status: AC
  Administered 2020-05-30: 1558 mg via INTRAVENOUS
  Filled 2020-05-30: qty 40.98

## 2020-05-30 MED ORDER — SODIUM CHLORIDE 0.9% FLUSH
10.0000 mL | INTRAVENOUS | Status: DC | PRN
  Administered 2020-05-30: 10 mL
  Filled 2020-05-30: qty 10

## 2020-05-30 MED ORDER — SODIUM CHLORIDE 0.9 % IV SOLN
Freq: Once | INTRAVENOUS | Status: AC
  Filled 2020-05-30: qty 250

## 2020-05-30 MED ORDER — HEPARIN SOD (PORK) LOCK FLUSH 100 UNIT/ML IV SOLN
500.0000 [IU] | Freq: Once | INTRAVENOUS | Status: AC | PRN
  Administered 2020-05-30: 500 [IU]
  Filled 2020-05-30: qty 5

## 2020-05-30 MED ORDER — PROCHLORPERAZINE MALEATE 10 MG PO TABS
ORAL_TABLET | ORAL | Status: AC
  Filled 2020-05-30: qty 1

## 2020-05-30 MED ORDER — PACLITAXEL PROTEIN-BOUND CHEMO INJECTION 100 MG
100.0000 mg/m2 | Freq: Once | INTRAVENOUS | Status: AC
  Administered 2020-05-30: 200 mg via INTRAVENOUS
  Filled 2020-05-30: qty 40

## 2020-05-30 MED ORDER — PROCHLORPERAZINE MALEATE 10 MG PO TABS
10.0000 mg | ORAL_TABLET | Freq: Once | ORAL | Status: AC
  Administered 2020-05-30: 10 mg via ORAL

## 2020-05-30 NOTE — Progress Notes (Addendum)
Kaylee Mullins   Diagnosis: Ampullary cancer  INTERVAL HISTORY:   Kaylee Mullins returns as scheduled.  She completed another cycle of gemcitabine 05/16/2020.  She denies nausea/vomiting.  No constipation or diarrhea.  No rash or fever after treatment.  Stable numbness 3 toes on the left foot.  Occasional left-sided abdominal pain.  Objective:  Vital signs in last 24 hours:  Blood pressure 135/64, pulse (!) 55, temperature (!) 97 F (36.1 C), temperature source Tympanic, resp. rate 17, height 5\' 4"  (1.626 m), weight 180 lb 9.6 oz (81.9 kg), SpO2 100 %.    HEENT: No thrush or ulcers. Resp: Lungs clear bilaterally. Cardio: Regular rate and rhythm. GI: Abdomen soft.  Mild tenderness left lower abdomen.  No hepatomegaly.  No mass. Vascular: Trace edema lower legs bilaterally.  Port-A-Cath without erythema.  Lab Results:  Lab Results  Component Value Date   WBC 4.7 05/30/2020   HGB 10.9 (L) 05/30/2020   HCT 33.6 (L) 05/30/2020   MCV 93.1 05/30/2020   PLT 221 05/30/2020   NEUTROABS 2.8 05/30/2020    Imaging:  No results found.  Medications: I have reviewed the patient's current medications.  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 CT abdomen/pelvis 10/23/2016-ampullary mass, single mildly enlarged porta hepatis lymph node, no evidence of distant metastatic disease ? Status post pancreaticoduodenectomy 01/12/2017; pT3pN2 ? CT abdomen/pelvis 03/30/2018-ablation defect within the upper pole of the right kidney. No evidence of recurrent renal mass. New mild left periaortic retroperitoneal lymphadenopathy. ? CT abdomen/pelvis 06/30/2018-6 mm right middle lobe nodule-slowly growing over multiple CTs, stable ablation defect in the right kidney, 1.9 cm left periaortic node increased from 1.2 cm, stable 1.5 cm porta hepatis node ? CT  abdomen/pelvis 09/17/2018-surgical changes related to Whipple procedure. Stable upper abdominal/retroperitoneal lymphadenopathy. 5 mm right middle lobe nodule, stable from most recent CT but mildly progressive from priors. Postprocedural changes related to renal ablation in the medial right upper kidney. ? CTs 03/24/2019-a few scattered pulmonary nodules appears stable; liver has a slightly shrunken appearance and nodular appearance compatible with mild cirrhosis. No suspicious cystic or solid hepatic lesions. Upper abdominal and retroperitoneal lymphadenopathy appears similar compared to the prior study. No other new lymphadenopathy noted elsewhere in the abdomen or pelvis. ? PET scan 07/03/2019-hypermetabolic lymph node adjacent porta hepatis. Newfocal hypermetabolic lesion in the liver. Hypermetabolic activity in the left suprarenal location favored metastatic adenopathy in the retroperitoneum. Diffuse hypermetabolic activity within the stomach favor gastritis. ? Cycle 1 gemcitabine/Abraxane 08/31/2019 ? Cycle 2 gemcitabine/Abraxane 09/14/2019 ? Cycle 3 gemcitabine/Abraxane 09/29/2019 ? Cycle 4 gemcitabine/Abraxane 10/13/2019 ? Cycle 5 gemcitabine/Abraxane 10/27/2019 ? Cycle 6 gemcitabine/Abraxane 11/09/2019 ? CTs 11/13/2019-new 2 mm right apical and 3 mm left lower lobe nodules, stable portacaval node, enlargement of amorphous soft tissue encasing the left renal artery, stable liver lesion ? Cycle 7 gemcitabine/Abraxane 11/24/2019 ? Cycle 8 gemcitabine/Abraxane 12/07/2019 ? 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 Cycle 10 gemcitabine/Abraxane 01/05/2020 ? Cycle 11 gemcitabine/Abraxane 01/18/2020 ? CTs 01/30/2020-small right pleural effusion. Rounded peripheral mass along the margin of the right lower lobe posteriorly adjacent to the pleural effusion 3.0 x 1.9 cm compared to previous measurement of 3.0 x 1.9 cm. Stable 4 mm right upper lobe nodule. Stable 0.6 x 0.5 cm  right middle lobe nodule. Stable 3 mm nodule in the superior segment left lower lobe. No new nodules identified. Reduced conspicuity of  margins of the moderately heterogeneous anterior liver lesion measuring 5.2 x 2.5 x 4.4 cm, previous measurement 3.0 x 2.4 x 3.4 cm. Nodular contour of the liver. Region of hypodensity in segment 5 measuring 2.6 x 1.8 cm, previously 2.0 x 1.3 cm. Portacaval node 1.2 cm, previously 1.3 cm. Porta hepatis node 0.9 cm, formerly 1.3 cm. Abnormal indistinctly marginated soft tissue density probably reflecting nodal tissue surrounding the left proximal renal artery measuring 1.8 cm, formerly 1.9 cm. ? Cycle 12 gemcitabine/Abraxane 02/01/2020 ? Cycle13gemcitabine/Abraxane 02/16/2020 ? Cycle 14 gemcitabine/Abraxane 02/29/2020 ? 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 Cycle 16 gemcitabine 04/05/2020 (Abraxane held due to neuropathy) ? Cycle 17 gemcitabine 04/18/2020 (Abraxane held due to neuropathy) ? CTs 04/30/2020-previously noted pleural-based masslike lesion in the posterior aspect of the right lower lobe with slow increase in size over numerous prior examinations. Slight regression of multiple liver metastases. No new liver lesions noted. Stable left para-aortic lymphadenopathy. ? Continue every 2-week gemcitabine ? Cycle 18 gemcitabine 05/02/2020 (Abraxane held due to neuropathy) ? Cycle 19 gemcitabine 05/16/2020 ? Cycle 20 gemcitabine/Abraxane 05/30/2020 2. Biliary obstruction secondary to #1, status post placement of a metal, bile duct stent on 10/23/2016  3. 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 Renal cell carcinoma-status post ablation of a right renal mass 07/15/2016, biopsy confirmed papillary renal cell carcinoma, Fuhrman grade 3  5. CVA in 2016  6. History of thyroid cancer-status post thyroidectomy and radioactive iodine in 2005  7. Diabetes  8.Hypertension  9.Left lower  extremity deep vein thrombosis June 2019, IVC filter placed July 2018 after she was diagnosed with a rectus hematoma while on Lovenox  10. Hospitalized 04/12/2019 through 04/14/2019 with traumatic hematoma right lower leg, followed at the wound clinic  11.Port-A-Cath placement interventional radiology 08/29/2019  12.Covid vaccine-second injection administered 11/10/2019; booster 04/05/2020   Disposition: Kaylee Mullins appears stable.  She most recently has been treated with gemcitabine alone.  Abraxane has been on hold due to neuropathy.  Review of the CA 19 9 over the past approximate 2 months shows steady increase.  We discussed resuming Abraxane today.  She understands the neuropathy symptoms may worsen.  She agrees to proceed with gemcitabine/Abraxane.  We will continue to follow the CA 19-9.  We reviewed the CBC and chemistry panel from today.  Labs adequate to proceed with treatment.  She has hypokalemia and will resume K-Dur 20 milliequivalents daily.  Transaminases are elevated, total bilirubin normal.  We will continue to monitor.  She will return for lab, follow-up, gemcitabine/Abraxane in 2 weeks.  She will contact the office in the interim with any problems.  Patient seen with Dr. Benay Spice.    [name] ANP/GNP-BC   05/30/2020  2:21 PM This was a shared visit with [name].  Kaylee Mullins appears unchanged.  The CA 19-9 has been rising over the past few months.  She will resume treatment with Abraxane.  She understands the chance of increased neuropathy with Abraxane.  She agrees to proceed.  [name], MD

## 2020-05-30 NOTE — Patient Instructions (Signed)
[facility] Discharge Instructions for Patients Receiving Chemotherapy  Today you received the following chemotherapy agents abraxane and gemzar.  To help prevent nausea and vomiting after your treatment, we encourage you to take your nausea medication as directed.   If you develop nausea and vomiting that is not controlled by your nausea medication, call the clinic.   BELOW ARE SYMPTOMS THAT SHOULD BE REPORTED IMMEDIATELY:  *FEVER GREATER THAN 100.5 F  *CHILLS WITH OR WITHOUT FEVER  NAUSEA AND VOMITING THAT IS NOT CONTROLLED WITH YOUR NAUSEA MEDICATION  *UNUSUAL SHORTNESS OF BREATH  *UNUSUAL BRUISING OR BLEEDING  TENDERNESS IN MOUTH AND THROAT WITH OR WITHOUT PRESENCE OF ULCERS  *URINARY PROBLEMS  *BOWEL PROBLEMS  UNUSUAL RASH Items with * indicate a potential emergency and should be followed up as soon as possible.  Feel free to call the clinic should you have any questions or concerns. The clinic phone number is [phone].  Please show the CHEMO ALERT CARD at check-in to the Emergency Department and triage nurse.   

## 2020-05-31 LAB — CANCER ANTIGEN 19-9: CA 19-9: 6520 U/mL — ABNORMAL HIGH (ref 0–35)

## 2020-06-03 ENCOUNTER — Telehealth: Payer: Self-pay | Admitting: Oncology

## 2020-06-03 NOTE — Telephone Encounter (Signed)
Scheduled per 10/14 los, patient has been called and notified.

## 2020-06-08 ENCOUNTER — Other Ambulatory Visit: Payer: Self-pay | Admitting: Oncology

## 2020-06-09 ENCOUNTER — Other Ambulatory Visit: Payer: Self-pay | Admitting: Oncology

## 2020-06-13 ENCOUNTER — Ambulatory Visit: Payer: Medicare Other | Admitting: Nurse Practitioner

## 2020-06-13 ENCOUNTER — Other Ambulatory Visit: Payer: Medicare Other

## 2020-06-13 ENCOUNTER — Ambulatory Visit: Payer: Medicare Other

## 2020-06-14 ENCOUNTER — Other Ambulatory Visit: Payer: Self-pay

## 2020-06-14 ENCOUNTER — Ambulatory Visit: Payer: Medicare Other

## 2020-06-14 ENCOUNTER — Inpatient Hospital Stay (HOSPITAL_BASED_OUTPATIENT_CLINIC_OR_DEPARTMENT_OTHER): Payer: Medicare Other | Admitting: Nurse Practitioner

## 2020-06-14 ENCOUNTER — Encounter: Payer: Self-pay | Admitting: Nurse Practitioner

## 2020-06-14 ENCOUNTER — Inpatient Hospital Stay: Payer: Medicare Other

## 2020-06-14 VITALS — BP 113/61 | HR 58 | Temp 94.4°F | Resp 18 | Ht 64.0 in | Wt 192.1 lb

## 2020-06-14 DIAGNOSIS — Z95828 Presence of other vascular implants and grafts: Secondary | ICD-10-CM

## 2020-06-14 DIAGNOSIS — C801 Malignant (primary) neoplasm, unspecified: Secondary | ICD-10-CM

## 2020-06-14 DIAGNOSIS — Z5111 Encounter for antineoplastic chemotherapy: Secondary | ICD-10-CM | POA: Diagnosis not present

## 2020-06-14 DIAGNOSIS — C241 Malignant neoplasm of ampulla of Vater: Secondary | ICD-10-CM

## 2020-06-14 LAB — CMP (CANCER CENTER ONLY)
ALT: 38 U/L (ref 0–44)
AST: 51 U/L — ABNORMAL HIGH (ref 15–41)
Albumin: 3.2 g/dL — ABNORMAL LOW (ref 3.5–5.0)
Alkaline Phosphatase: 233 U/L — ABNORMAL HIGH (ref 38–126)
Anion gap: 7 (ref 5–15)
BUN: 21 mg/dL (ref 8–23)
CO2: 29 mmol/L (ref 22–32)
Calcium: 8.4 mg/dL — ABNORMAL LOW (ref 8.9–10.3)
Chloride: 107 mmol/L (ref 98–111)
Creatinine: 1.05 mg/dL — ABNORMAL HIGH (ref 0.44–1.00)
GFR, Estimated: 53 mL/min — ABNORMAL LOW (ref >60)
Glucose, Bld: 144 mg/dL — ABNORMAL HIGH (ref 70–99)
Potassium: 4 mmol/L (ref 3.5–5.1)
Sodium: 143 mmol/L (ref 135–145)
Total Bilirubin: 0.6 mg/dL (ref 0.3–1.2)
Total Protein: 6.6 g/dL (ref 6.5–8.1)

## 2020-06-14 LAB — CBC WITH DIFFERENTIAL (CANCER CENTER ONLY)
Abs Immature Granulocytes: 0.03 10*3/uL (ref 0.00–0.07)
Basophils Absolute: 0 10*3/uL (ref 0.0–0.1)
Basophils Relative: 1 %
Eosinophils Absolute: 0.1 10*3/uL (ref 0.0–0.5)
Eosinophils Relative: 1 %
HCT: 33.2 % — ABNORMAL LOW (ref 36.0–46.0)
Hemoglobin: 10.5 g/dL — ABNORMAL LOW (ref 12.0–15.0)
Immature Granulocytes: 1 %
Lymphocytes Relative: 24 %
Lymphs Abs: 1.2 10*3/uL (ref 0.7–4.0)
MCH: 29.9 pg (ref 26.0–34.0)
MCHC: 31.6 g/dL (ref 30.0–36.0)
MCV: 94.6 fL (ref 80.0–100.0)
Monocytes Absolute: 0.4 10*3/uL (ref 0.1–1.0)
Monocytes Relative: 8 %
Neutro Abs: 3.3 10*3/uL (ref 1.7–7.7)
Neutrophils Relative %: 65 %
Platelet Count: 233 10*3/uL (ref 150–400)
RBC: 3.51 MIL/uL — ABNORMAL LOW (ref 3.87–5.11)
RDW: 18.2 % — ABNORMAL HIGH (ref 11.5–15.5)
WBC Count: 5 10*3/uL (ref 4.0–10.5)
nRBC: 0 % (ref 0.0–0.2)

## 2020-06-14 MED ORDER — SODIUM CHLORIDE 0.9% FLUSH
10.0000 mL | INTRAVENOUS | Status: DC | PRN
  Administered 2020-06-14: 10 mL
  Filled 2020-06-14: qty 10

## 2020-06-14 MED ORDER — ACETAMINOPHEN 325 MG PO TABS
650.0000 mg | ORAL_TABLET | Freq: Once | ORAL | Status: AC
  Administered 2020-06-14: 650 mg via ORAL

## 2020-06-14 MED ORDER — SODIUM CHLORIDE 0.9 % IV SOLN
800.0000 mg/m2 | Freq: Once | INTRAVENOUS | Status: AC
  Administered 2020-06-14: 1558 mg via INTRAVENOUS
  Filled 2020-06-14: qty 40.98

## 2020-06-14 MED ORDER — PACLITAXEL PROTEIN-BOUND CHEMO INJECTION 100 MG
100.0000 mg/m2 | Freq: Once | INTRAVENOUS | Status: AC
  Administered 2020-06-14: 200 mg via INTRAVENOUS
  Filled 2020-06-14: qty 40

## 2020-06-14 MED ORDER — PROCHLORPERAZINE MALEATE 10 MG PO TABS
ORAL_TABLET | ORAL | Status: AC
  Filled 2020-06-14: qty 1

## 2020-06-14 MED ORDER — SODIUM CHLORIDE 0.9 % IV SOLN
Freq: Once | INTRAVENOUS | Status: AC
  Filled 2020-06-14: qty 250

## 2020-06-14 MED ORDER — ACETAMINOPHEN 325 MG PO TABS
ORAL_TABLET | ORAL | Status: AC
  Filled 2020-06-14: qty 2

## 2020-06-14 MED ORDER — PROCHLORPERAZINE MALEATE 10 MG PO TABS
10.0000 mg | ORAL_TABLET | Freq: Once | ORAL | Status: AC
  Administered 2020-06-14: 10 mg via ORAL

## 2020-06-14 MED ORDER — HEPARIN SOD (PORK) LOCK FLUSH 100 UNIT/ML IV SOLN
500.0000 [IU] | Freq: Once | INTRAVENOUS | Status: AC | PRN
  Administered 2020-06-14: 500 [IU]
  Filled 2020-06-14: qty 5

## 2020-06-14 MED ORDER — SODIUM CHLORIDE 0.9% FLUSH
10.0000 mL | Freq: Once | INTRAVENOUS | Status: AC
  Administered 2020-06-14: 10 mL
  Filled 2020-06-14: qty 10

## 2020-06-14 NOTE — Progress Notes (Signed)
[facility] Mull[facility] OFFICE PROGRESS NOTE   Diagnosis: Ampullary cancer  INTERVAL HISTORY:   Kaylee Mullins returns as scheduled.  She completed a cycle of gemcitabine/Abraxane 05/30/2020.  She denies nausea/vomiting.  No mouth sores.  No diarrhea.  No fever or rash following treatment.  She notes stable to improved neuropathy symptoms involving the right toes.  She notes increased leg swelling since stopping Lasix recently.  She plans to resume.  No cough or shortness of breath.  Left-sided abdominal pain is unchanged.  She takes Tylenol if needed with good relief.  Objective:  Vital signs in last 24 hours:  Blood pressure 113/61, pulse (!) 58, temperature (!) 94.4 F (34.7 C), temperature source Tympanic, resp. rate 18, height 5\' 4"  (1.626 m), weight 192 lb 1.6 oz (87.1 kg), SpO2 100 %.    HEENT: No thrush or ulcers. Resp: Lungs clear bilaterally. Cardio: Regular rate and rhythm. GI: Abdomen soft and nontender.  No hepatomegaly. Vascular: 1+ edema at the lower leg bilaterally.  Port-A-Cath without erythema.  Lab Results:  Lab Results  Component Value Date   WBC 5.0 06/14/2020   HGB 10.5 (L) 06/14/2020   HCT 33.2 (L) 06/14/2020   MCV 94.6 06/14/2020   PLT 233 06/14/2020   NEUTROABS 3.3 06/14/2020    Imaging:  No results found.  Medications: I have reviewed the patient's current medications.  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 CT abdomen/pelvis 10/23/2016-ampullary mass, single mildly enlarged porta hepatis lymph node, no evidence of distant metastatic disease ? Status post pancreaticoduodenectomy 01/12/2017; pT3pN2 ? CT abdomen/pelvis 03/30/2018-ablation defect within the upper pole of the right kidney. No evidence of recurrent renal mass. New mild left periaortic retroperitoneal lymphadenopathy. ? CT abdomen/pelvis 06/30/2018-6 mm right middle lobe  nodule-slowly growing over multiple CTs, stable ablation defect in the right kidney, 1.9 cm left periaortic node increased from 1.2 cm, stable 1.5 cm porta hepatis node ? CT abdomen/pelvis 09/17/2018-surgical changes related to Whipple procedure. Stable upper abdominal/retroperitoneal lymphadenopathy. 5 mm right middle lobe nodule, stable from most recent CT but mildly progressive from priors. Postprocedural changes related to renal ablation in the medial right upper kidney. ? CTs 03/24/2019-a few scattered pulmonary nodules appears stable; liver has a slightly shrunken appearance and nodular appearance compatible with mild cirrhosis. No suspicious cystic or solid hepatic lesions. Upper abdominal and retroperitoneal lymphadenopathy appears similar compared to the prior study. No other new lymphadenopathy noted elsewhere in the abdomen or pelvis. ? PET scan 07/03/2019-hypermetabolic lymph node adjacent porta hepatis. Newfocal hypermetabolic lesion in the liver. Hypermetabolic activity in the left suprarenal location favored metastatic adenopathy in the retroperitoneum. Diffuse hypermetabolic activity within the stomach favor gastritis. ? Cycle 1 gemcitabine/Abraxane 08/31/2019 ? Cycle 2 gemcitabine/Abraxane 09/14/2019 ? Cycle 3 gemcitabine/Abraxane 09/29/2019 ? Cycle 4 gemcitabine/Abraxane 10/13/2019 ? Cycle 5 gemcitabine/Abraxane 10/27/2019 ? Cycle 6 gemcitabine/Abraxane 11/09/2019 ? CTs 11/13/2019-new 2 mm right apical and 3 mm left lower lobe nodules, stable portacaval node, enlargement of amorphous soft tissue encasing the left renal artery, stable liver lesion ? Cycle 7 gemcitabine/Abraxane 11/24/2019 ? Cycle 8 gemcitabine/Abraxane 12/07/2019 ? 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 Cycle 10 gemcitabine/Abraxane 01/05/2020 ? Cycle 11 gemcitabine/Abraxane 01/18/2020 ? CTs 01/30/2020-small right pleural effusion. Rounded peripheral mass along the margin of the right lower  lobe posteriorly adjacent to the pleural effusion 3.0 x 1.9 cm compared to previous measurement of 3.0 x 1.9 cm. Stable 4  mm right upper lobe nodule. Stable 0.6 x 0.5 cm right middle lobe nodule. Stable 3 mm nodule in the superior segment left lower lobe. No new nodules identified. Reduced conspicuity of margins of the moderately heterogeneous anterior liver lesion measuring 5.2 x 2.5 x 4.4 cm, previous measurement 3.0 x 2.4 x 3.4 cm. Nodular contour of the liver. Region of hypodensity in segment 5 measuring 2.6 x 1.8 cm, previously 2.0 x 1.3 cm. Portacaval node 1.2 cm, previously 1.3 cm. Porta hepatis node 0.9 cm, formerly 1.3 cm. Abnormal indistinctly marginated soft tissue density probably reflecting nodal tissue surrounding the left proximal renal artery measuring 1.8 cm, formerly 1.9 cm. ? Cycle 12 gemcitabine/Abraxane 02/01/2020 ? Cycle13gemcitabine/Abraxane 02/16/2020 ? Cycle 14 gemcitabine/Abraxane 02/29/2020 ? 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 Cycle 16 gemcitabine 04/05/2020 (Abraxane held due to neuropathy) ? Cycle 17 gemcitabine 04/18/2020 (Abraxane held due to neuropathy) ? CTs 04/30/2020-previously noted pleural-based masslike lesion in the posterior aspect of the right lower lobe with slow increase in size over numerous prior examinations. Slight regression of multiple liver metastases. No new liver lesions noted. Stable left para-aortic lymphadenopathy. ? Continue every 2-week gemcitabine ? Cycle 18 gemcitabine 05/02/2020 (Abraxane held due to neuropathy) ? Cycle 19gemcitabine 05/16/2020 ? Cycle 20 gemcitabine/Abraxane 05/30/2020 ? Cycle 21 gemcitabine/Abraxane 06/14/2020 2. Biliary obstruction secondary to #1, status post placement of a metal, bile duct stent on 10/23/2016  3. 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 Renal cell carcinoma-status post ablation of a right renal mass 07/15/2016, biopsy confirmed papillary renal  cell carcinoma, Fuhrman grade 3  5. CVA in 2016  6. History of thyroid cancer-status post thyroidectomy and radioactive iodine in 2005  7. Diabetes  8.Hypertension  9.Left lower extremity deep vein thrombosis June 2019, IVC filter placed July 2018 after she was diagnosed with a rectus hematoma while on Lovenox  10. Hospitalized 04/12/2019 through 04/14/2019 with traumatic hematoma right lower leg, followed at the wound clinic  11.Port-A-Cath placement interventional radiology 08/29/2019  12.Covid vaccine-second injection administered 11/10/2019; booster 04/05/2020   Disposition: Kaylee Mullins appears stable.  She completed a cycle of gemcitabine/Abraxane 2 weeks ago.  Abraxane was resumed due to a rise in the CA 19-9.  She tolerated well.  Neuropathy symptoms are stable.  Plan to proceed with the next cycle today as scheduled.  We reviewed the CBC from today.  Counts adequate to proceed with treatment.  Chemistry panel is pending.  She will return for lab, follow-up, gemcitabine/Abraxane in 2 weeks.  She will contact the office in the interim with any problems.  [name] ANP/GNP-BC   06/14/2020  2:52 PM

## 2020-06-14 NOTE — Patient Instructions (Signed)
[facility] Discharge Instructions for Patients Receiving Chemotherapy  Today you received the following chemotherapy agents abraxane and gemzar.  To help prevent nausea and vomiting after your treatment, we encourage you to take your nausea medication as directed.   If you develop nausea and vomiting that is not controlled by your nausea medication, call the clinic.   BELOW ARE SYMPTOMS THAT SHOULD BE REPORTED IMMEDIATELY:  *FEVER GREATER THAN 100.5 F  *CHILLS WITH OR WITHOUT FEVER  NAUSEA AND VOMITING THAT IS NOT CONTROLLED WITH YOUR NAUSEA MEDICATION  *UNUSUAL SHORTNESS OF BREATH  *UNUSUAL BRUISING OR BLEEDING  TENDERNESS IN MOUTH AND THROAT WITH OR WITHOUT PRESENCE OF ULCERS  *URINARY PROBLEMS  *BOWEL PROBLEMS  UNUSUAL RASH Items with * indicate a potential emergency and should be followed up as soon as possible.  Feel free to call the clinic should you have any questions or concerns. The clinic phone number is [phone].  Please show the CHEMO ALERT CARD at check-in to the Emergency Department and triage nurse.   

## 2020-06-15 LAB — CANCER ANTIGEN 19-9: CA 19-9: 7078 U/mL — ABNORMAL HIGH (ref 0–35)

## 2020-06-17 ENCOUNTER — Telehealth: Payer: Self-pay | Admitting: Nurse Practitioner

## 2020-06-17 LAB — GLUCOSE, CAPILLARY: Glucose-Capillary: 179 mg/dL — ABNORMAL HIGH (ref 70–99)

## 2020-06-17 NOTE — Telephone Encounter (Signed)
Scheduled appointments per 10/29 los. Mailed updated calendar to patient with appointments dates and times.

## 2020-06-20 ENCOUNTER — Other Ambulatory Visit: Payer: Self-pay | Admitting: Internal Medicine

## 2020-06-23 ENCOUNTER — Other Ambulatory Visit: Payer: Self-pay | Admitting: Oncology

## 2020-06-27 ENCOUNTER — Inpatient Hospital Stay (HOSPITAL_BASED_OUTPATIENT_CLINIC_OR_DEPARTMENT_OTHER): Payer: Medicare Other | Admitting: Nurse Practitioner

## 2020-06-27 ENCOUNTER — Inpatient Hospital Stay: Payer: Medicare Other

## 2020-06-27 ENCOUNTER — Encounter: Payer: Self-pay | Admitting: Nurse Practitioner

## 2020-06-27 ENCOUNTER — Other Ambulatory Visit: Payer: Self-pay

## 2020-06-27 ENCOUNTER — Inpatient Hospital Stay: Payer: Medicare Other | Attending: Internal Medicine

## 2020-06-27 VITALS — BP 160/70 | HR 58 | Temp 97.0°F | Resp 16 | Ht 64.0 in | Wt 182.8 lb

## 2020-06-27 DIAGNOSIS — Z5111 Encounter for antineoplastic chemotherapy: Secondary | ICD-10-CM | POA: Insufficient documentation

## 2020-06-27 DIAGNOSIS — C241 Malignant neoplasm of ampulla of Vater: Secondary | ICD-10-CM

## 2020-06-27 DIAGNOSIS — Z85528 Personal history of other malignant neoplasm of kidney: Secondary | ICD-10-CM | POA: Diagnosis not present

## 2020-06-27 DIAGNOSIS — Z8673 Personal history of transient ischemic attack (TIA), and cerebral infarction without residual deficits: Secondary | ICD-10-CM | POA: Diagnosis not present

## 2020-06-27 DIAGNOSIS — I10 Essential (primary) hypertension: Secondary | ICD-10-CM | POA: Diagnosis not present

## 2020-06-27 DIAGNOSIS — Z86718 Personal history of other venous thrombosis and embolism: Secondary | ICD-10-CM | POA: Diagnosis not present

## 2020-06-27 DIAGNOSIS — Z95828 Presence of other vascular implants and grafts: Secondary | ICD-10-CM

## 2020-06-27 DIAGNOSIS — C787 Secondary malignant neoplasm of liver and intrahepatic bile duct: Secondary | ICD-10-CM | POA: Diagnosis not present

## 2020-06-27 DIAGNOSIS — G893 Neoplasm related pain (acute) (chronic): Secondary | ICD-10-CM | POA: Diagnosis not present

## 2020-06-27 DIAGNOSIS — E1140 Type 2 diabetes mellitus with diabetic neuropathy, unspecified: Secondary | ICD-10-CM | POA: Diagnosis not present

## 2020-06-27 DIAGNOSIS — C801 Malignant (primary) neoplasm, unspecified: Secondary | ICD-10-CM

## 2020-06-27 LAB — CMP (CANCER CENTER ONLY)
ALT: 25 U/L (ref 0–44)
AST: 32 U/L (ref 15–41)
Albumin: 3.1 g/dL — ABNORMAL LOW (ref 3.5–5.0)
Alkaline Phosphatase: 208 U/L — ABNORMAL HIGH (ref 38–126)
Anion gap: 8 (ref 5–15)
BUN: 18 mg/dL (ref 8–23)
CO2: 28 mmol/L (ref 22–32)
Calcium: 8.4 mg/dL — ABNORMAL LOW (ref 8.9–10.3)
Chloride: 104 mmol/L (ref 98–111)
Creatinine: 1 mg/dL (ref 0.44–1.00)
GFR, Estimated: 57 mL/min — ABNORMAL LOW (ref >60)
Glucose, Bld: 241 mg/dL — ABNORMAL HIGH (ref 70–99)
Potassium: 3.9 mmol/L (ref 3.5–5.1)
Sodium: 140 mmol/L (ref 135–145)
Total Bilirubin: 1.3 mg/dL — ABNORMAL HIGH (ref 0.3–1.2)
Total Protein: 6.8 g/dL (ref 6.5–8.1)

## 2020-06-27 LAB — CBC WITH DIFFERENTIAL (CANCER CENTER ONLY)
Abs Immature Granulocytes: 0.02 10*3/uL (ref 0.00–0.07)
Basophils Absolute: 0 10*3/uL (ref 0.0–0.1)
Basophils Relative: 0 %
Eosinophils Absolute: 0 10*3/uL (ref 0.0–0.5)
Eosinophils Relative: 1 %
HCT: 32.2 % — ABNORMAL LOW (ref 36.0–46.0)
Hemoglobin: 10.4 g/dL — ABNORMAL LOW (ref 12.0–15.0)
Immature Granulocytes: 0 %
Lymphocytes Relative: 17 %
Lymphs Abs: 0.9 10*3/uL (ref 0.7–4.0)
MCH: 30.4 pg (ref 26.0–34.0)
MCHC: 32.3 g/dL (ref 30.0–36.0)
MCV: 94.2 fL (ref 80.0–100.0)
Monocytes Absolute: 0.6 10*3/uL (ref 0.1–1.0)
Monocytes Relative: 11 %
Neutro Abs: 3.9 10*3/uL (ref 1.7–7.7)
Neutrophils Relative %: 71 %
Platelet Count: 196 10*3/uL (ref 150–400)
RBC: 3.42 MIL/uL — ABNORMAL LOW (ref 3.87–5.11)
RDW: 17.6 % — ABNORMAL HIGH (ref 11.5–15.5)
WBC Count: 5.5 10*3/uL (ref 4.0–10.5)
nRBC: 0 % (ref 0.0–0.2)

## 2020-06-27 MED ORDER — SODIUM CHLORIDE 0.9% FLUSH
10.0000 mL | Freq: Once | INTRAVENOUS | Status: AC
  Administered 2020-06-27: 10 mL
  Filled 2020-06-27: qty 10

## 2020-06-27 MED ORDER — SODIUM CHLORIDE 0.9 % IV SOLN
Freq: Once | INTRAVENOUS | Status: AC
  Filled 2020-06-27: qty 250

## 2020-06-27 MED ORDER — PROCHLORPERAZINE MALEATE 10 MG PO TABS
10.0000 mg | ORAL_TABLET | Freq: Once | ORAL | Status: AC
  Administered 2020-06-27: 10 mg via ORAL

## 2020-06-27 MED ORDER — HEPARIN SOD (PORK) LOCK FLUSH 100 UNIT/ML IV SOLN
500.0000 [IU] | Freq: Once | INTRAVENOUS | Status: AC | PRN
  Administered 2020-06-27: 500 [IU]
  Filled 2020-06-27: qty 5

## 2020-06-27 MED ORDER — SODIUM CHLORIDE 0.9% FLUSH
10.0000 mL | INTRAVENOUS | Status: DC | PRN
  Administered 2020-06-27: 10 mL
  Filled 2020-06-27: qty 10

## 2020-06-27 MED ORDER — PACLITAXEL PROTEIN-BOUND CHEMO INJECTION 100 MG
100.0000 mg/m2 | Freq: Once | INTRAVENOUS | Status: AC
  Administered 2020-06-27: 200 mg via INTRAVENOUS
  Filled 2020-06-27: qty 40

## 2020-06-27 MED ORDER — SODIUM CHLORIDE 0.9 % IV SOLN
800.0000 mg/m2 | Freq: Once | INTRAVENOUS | Status: AC
  Administered 2020-06-27: 1558 mg via INTRAVENOUS
  Filled 2020-06-27: qty 40.98

## 2020-06-27 MED ORDER — PROCHLORPERAZINE MALEATE 10 MG PO TABS
ORAL_TABLET | ORAL | Status: AC
  Filled 2020-06-27: qty 1

## 2020-06-27 NOTE — Progress Notes (Addendum)
[facility] OFFICE PROGRESS NOTE   Diagnosis:  Ampullary cancer  INTERVAL HISTORY:   Kaylee Mullins returns as scheduled.  She completed a cycle of gemcitabine/Abraxane 06/14/2020.  She denies nausea.  No mouth sores.  No diarrhea.  No bleeding.  No fever or cough.  She has mild occasional dyspnea on exertion.  Neuropathy symptoms involving toes unchanged.  Stable intermittent pain left abdomen.  She notes recurrent hair loss since resuming Abraxane.  She has not felt well today.  She thinks this may be due to not eating well.  Appetite has been decreased as of late.  Objective:  Vital signs in last 24 hours:  Blood pressure (!) 160/70, pulse (!) 58, temperature (!) 97 F (36.1 C), temperature source Tympanic, resp. rate 16, height 5\' 4"  (1.626 m), weight 182 lb 12.8 oz (82.9 kg), SpO2 98 %.    HEENT: No thrush or ulcers. Resp: Rhonchi at the right lower lung field.  Lungs otherwise clear.  No respiratory distress. Cardio: Regular rate and rhythm. GI: Abdomen is soft.  No hepatosplenomegaly.  No mass. Vascular: 1+ edema at the lower leg bilaterally.  Port-A-Cath without erythema.  Lab Results:  Lab Results  Component Value Date   WBC 5.5 06/27/2020   HGB 10.4 (L) 06/27/2020   HCT 32.2 (L) 06/27/2020   MCV 94.2 06/27/2020   PLT 196 06/27/2020   NEUTROABS 3.9 06/27/2020    Imaging:  No results found.  Medications: I have reviewed the patient's current medications.  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 CT abdomen/pelvis 10/23/2016-ampullary mass, single mildly enlarged porta hepatis lymph node, no evidence of distant metastatic disease ? Status post pancreaticoduodenectomy 01/12/2017; pT3pN2 ? CT abdomen/pelvis 03/30/2018-ablation defect within the upper pole of the right kidney. No evidence of recurrent renal mass. New mild left periaortic  retroperitoneal lymphadenopathy. ? CT abdomen/pelvis 06/30/2018-6 mm right middle lobe nodule-slowly growing over multiple CTs, stable ablation defect in the right kidney, 1.9 cm left periaortic node increased from 1.2 cm, stable 1.5 cm porta hepatis node ? CT abdomen/pelvis 09/17/2018-surgical changes related to Whipple procedure. Stable upper abdominal/retroperitoneal lymphadenopathy. 5 mm right middle lobe nodule, stable from most recent CT but mildly progressive from priors. Postprocedural changes related to renal ablation in the medial right upper kidney. ? CTs 03/24/2019-a few scattered pulmonary nodules appears stable; liver has a slightly shrunken appearance and nodular appearance compatible with mild cirrhosis. No suspicious cystic or solid hepatic lesions. Upper abdominal and retroperitoneal lymphadenopathy appears similar compared to the prior study. No other new lymphadenopathy noted elsewhere in the abdomen or pelvis. ? PET scan 07/03/2019-hypermetabolic lymph node adjacent porta hepatis. Newfocal hypermetabolic lesion in the liver. Hypermetabolic activity in the left suprarenal location favored metastatic adenopathy in the retroperitoneum. Diffuse hypermetabolic activity within the stomach favor gastritis. ? Cycle 1 gemcitabine/Abraxane 08/31/2019 ? Cycle 2 gemcitabine/Abraxane 09/14/2019 ? Cycle 3 gemcitabine/Abraxane 09/29/2019 ? Cycle 4 gemcitabine/Abraxane 10/13/2019 ? Cycle 5 gemcitabine/Abraxane 10/27/2019 ? Cycle 6 gemcitabine/Abraxane 11/09/2019 ? CTs 11/13/2019-new 2 mm right apical and 3 mm left lower lobe nodules, stable portacaval node, enlargement of amorphous soft tissue encasing the left renal artery, stable liver lesion ? Cycle 7 gemcitabine/Abraxane 11/24/2019 ? Cycle 8 gemcitabine/Abraxane 12/07/2019 ? 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 Cycle 10 gemcitabine/Abraxane 01/05/2020 ? Cycle 11 gemcitabine/Abraxane 01/18/2020 ? CTs  01/30/2020-small right pleural effusion. Rounded peripheral mass along the margin of the right  lower lobe posteriorly adjacent to the pleural effusion 3.0 x 1.9 cm compared to previous measurement of 3.0 x 1.9 cm. Stable 4 mm right upper lobe nodule. Stable 0.6 x 0.5 cm right middle lobe nodule. Stable 3 mm nodule in the superior segment left lower lobe. No new nodules identified. Reduced conspicuity of margins of the moderately heterogeneous anterior liver lesion measuring 5.2 x 2.5 x 4.4 cm, previous measurement 3.0 x 2.4 x 3.4 cm. Nodular contour of the liver. Region of hypodensity in segment 5 measuring 2.6 x 1.8 cm, previously 2.0 x 1.3 cm. Portacaval node 1.2 cm, previously 1.3 cm. Porta hepatis node 0.9 cm, formerly 1.3 cm. Abnormal indistinctly marginated soft tissue density probably reflecting nodal tissue surrounding the left proximal renal artery measuring 1.8 cm, formerly 1.9 cm. ? Cycle 12 gemcitabine/Abraxane 02/01/2020 ? Cycle13gemcitabine/Abraxane 02/16/2020 ? Cycle 14 gemcitabine/Abraxane 02/29/2020 ? 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 Cycle 16 gemcitabine 04/05/2020 (Abraxane held due to neuropathy) ? Cycle 17 gemcitabine 04/18/2020 (Abraxane held due to neuropathy) ? CTs 04/30/2020-previously noted pleural-based masslike lesion in the posterior aspect of the right lower lobe with slow increase in size over numerous prior examinations. Slight regression of multiple liver metastases. No new liver lesions noted. Stable left para-aortic lymphadenopathy. ? Continue every 2-week gemcitabine ? Cycle 18 gemcitabine 05/02/2020 (Abraxane held due to neuropathy) ? Cycle 19gemcitabine 05/16/2020 ? Cycle 20 gemcitabine/Abraxane 05/30/2020 ? Cycle 21 gemcitabine/Abraxane 06/14/2020 2. Biliary obstruction secondary to #1, status post placement of a metal, bile duct stent on 10/23/2016  3. 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 Renal  cell carcinoma-status post ablation of a right renal mass 07/15/2016, biopsy confirmed papillary renal cell carcinoma, Fuhrman grade 3  5. CVA in 2016  6. History of thyroid cancer-status post thyroidectomy and radioactive iodine in 2005  7. Diabetes  8.Hypertension  9.Left lower extremity deep vein thrombosis June 2019, IVC filter placed July 2018 after she was diagnosed with a rectus hematoma while on Lovenox  10. Hospitalized 04/12/2019 through 04/14/2019 with traumatic hematoma right lower leg, followed at the wound clinic  11.Port-A-Cath placement interventional radiology 08/29/2019  12.Covid vaccine-second injection administered 11/10/2019; booster 04/05/2020   Disposition: Kaylee Mullins appears unchanged.  She completed another cycle of gemcitabine/Abraxane 2 weeks ago.  She seems to be tolerating the chemotherapy well.  Plan to proceed with treatment today as scheduled.  We reviewed the CBC and chemistry panel from today.  Labs adequate to proceed with treatment.  We discussed the change in her weight as compared to 2 weeks ago.  Weight is stable as compared to 4 weeks ago.  She will return for lab, follow-up, gemcitabine Abraxane on 07/18/2020.  She will contact the office in the interim with any problems.  Patient seen with Dr. Benay Spice.  [name] ANP/GNP-BC   06/27/2020  2:50 PM  This was a shared visit with [name].  Kaylee Mullins appears stable.  She is tolerating the gemcitabine/Abraxane well.  We will follow up on the CA 19-9 from today.  [name], MD

## 2020-06-27 NOTE — Patient Instructions (Signed)
[facility] Discharge Instructions for Patients Receiving Chemotherapy  Today you received the following chemotherapy agents Paclitaxel-protein bound (ABRAXANE) & Gemcitabine (GEMZAR).  To help prevent nausea and vomiting after your treatment, we encourage you to take your nausea medication as prescribed.   If you develop nausea and vomiting that is not controlled by your nausea medication, call the clinic.   BELOW ARE SYMPTOMS THAT SHOULD BE REPORTED IMMEDIATELY:  *FEVER GREATER THAN 100.5 F  *CHILLS WITH OR WITHOUT FEVER  NAUSEA AND VOMITING THAT IS NOT CONTROLLED WITH YOUR NAUSEA MEDICATION  *UNUSUAL SHORTNESS OF BREATH  *UNUSUAL BRUISING OR BLEEDING  TENDERNESS IN MOUTH AND THROAT WITH OR WITHOUT PRESENCE OF ULCERS  *URINARY PROBLEMS  *BOWEL PROBLEMS  UNUSUAL RASH Items with * indicate a potential emergency and should be followed up as soon as possible.  Feel free to call the clinic should you have any questions or concerns. The clinic phone number is [phone].  Please show the [facility] at check-in to the Emergency Department and triage nurse.

## 2020-06-28 ENCOUNTER — Telehealth: Payer: Self-pay | Admitting: *Deleted

## 2020-06-28 ENCOUNTER — Telehealth: Payer: Self-pay | Admitting: Nurse Practitioner

## 2020-06-28 LAB — CANCER ANTIGEN 19-9: CA 19-9: 6719 U/mL — ABNORMAL HIGH (ref 0–35)

## 2020-06-28 NOTE — Telephone Encounter (Signed)
-----   Message from Owens Shark, NP sent at 06/28/2020  8:42 AM EST ----- Please let her know the CA 19-9 tumor marker from yesterday was better.  Follow-up as scheduled.

## 2020-06-28 NOTE — Telephone Encounter (Signed)
Scheduled appointments per 11/11 los. Mailed updated calendar to patient with appointments dates and times.

## 2020-06-28 NOTE — Telephone Encounter (Signed)
Notified of improvement in CA 19.9. F/U as scheduled.

## 2020-07-03 ENCOUNTER — Telehealth: Payer: Self-pay | Admitting: *Deleted

## 2020-07-03 NOTE — Telephone Encounter (Signed)
Reports having significant shortness of breath Sunday and Sunday night. Daughter told her to go to ER, but she declined. Has improved, but still some shortness of breath w/exertion. No cough, chest pain or fever. Report she edema in legs and feet are better. Asking if she should be seen tomorrow. Suggested she call her PCP or cardiologist about the shortness of breath. She agrees to call PCP now.

## 2020-07-13 ENCOUNTER — Other Ambulatory Visit: Payer: Self-pay | Admitting: Oncology

## 2020-07-18 ENCOUNTER — Inpatient Hospital Stay: Payer: Medicare Other

## 2020-07-18 ENCOUNTER — Inpatient Hospital Stay (HOSPITAL_BASED_OUTPATIENT_CLINIC_OR_DEPARTMENT_OTHER): Payer: Medicare Other | Admitting: Oncology

## 2020-07-18 ENCOUNTER — Inpatient Hospital Stay: Payer: Medicare Other | Attending: Internal Medicine

## 2020-07-18 ENCOUNTER — Other Ambulatory Visit: Payer: Self-pay

## 2020-07-18 VITALS — BP 127/68 | HR 61 | Temp 97.9°F | Resp 18 | Ht 64.0 in | Wt 188.5 lb

## 2020-07-18 DIAGNOSIS — Z9049 Acquired absence of other specified parts of digestive tract: Secondary | ICD-10-CM | POA: Insufficient documentation

## 2020-07-18 DIAGNOSIS — I10 Essential (primary) hypertension: Secondary | ICD-10-CM | POA: Diagnosis not present

## 2020-07-18 DIAGNOSIS — Z86718 Personal history of other venous thrombosis and embolism: Secondary | ICD-10-CM | POA: Insufficient documentation

## 2020-07-18 DIAGNOSIS — C787 Secondary malignant neoplasm of liver and intrahepatic bile duct: Secondary | ICD-10-CM | POA: Insufficient documentation

## 2020-07-18 DIAGNOSIS — C241 Malignant neoplasm of ampulla of Vater: Secondary | ICD-10-CM | POA: Insufficient documentation

## 2020-07-18 DIAGNOSIS — C801 Malignant (primary) neoplasm, unspecified: Secondary | ICD-10-CM

## 2020-07-18 DIAGNOSIS — Z8673 Personal history of transient ischemic attack (TIA), and cerebral infarction without residual deficits: Secondary | ICD-10-CM | POA: Diagnosis not present

## 2020-07-18 DIAGNOSIS — Z7901 Long term (current) use of anticoagulants: Secondary | ICD-10-CM | POA: Diagnosis not present

## 2020-07-18 DIAGNOSIS — Z5111 Encounter for antineoplastic chemotherapy: Secondary | ICD-10-CM | POA: Diagnosis present

## 2020-07-18 DIAGNOSIS — Z95828 Presence of other vascular implants and grafts: Secondary | ICD-10-CM

## 2020-07-18 DIAGNOSIS — Z85850 Personal history of malignant neoplasm of thyroid: Secondary | ICD-10-CM | POA: Diagnosis not present

## 2020-07-18 DIAGNOSIS — E119 Type 2 diabetes mellitus without complications: Secondary | ICD-10-CM | POA: Insufficient documentation

## 2020-07-18 LAB — CMP (CANCER CENTER ONLY)
ALT: 20 U/L (ref 0–44)
AST: 39 U/L (ref 15–41)
Albumin: 2.9 g/dL — ABNORMAL LOW (ref 3.5–5.0)
Alkaline Phosphatase: 204 U/L — ABNORMAL HIGH (ref 38–126)
Anion gap: 8 (ref 5–15)
BUN: 21 mg/dL (ref 8–23)
CO2: 24 mmol/L (ref 22–32)
Calcium: 8.3 mg/dL — ABNORMAL LOW (ref 8.9–10.3)
Chloride: 110 mmol/L (ref 98–111)
Creatinine: 0.99 mg/dL (ref 0.44–1.00)
GFR, Estimated: 57 mL/min — ABNORMAL LOW (ref >60)
Glucose, Bld: 222 mg/dL — ABNORMAL HIGH (ref 70–99)
Potassium: 3.6 mmol/L (ref 3.5–5.1)
Sodium: 142 mmol/L (ref 135–145)
Total Bilirubin: 0.4 mg/dL (ref 0.3–1.2)
Total Protein: 6.8 g/dL (ref 6.5–8.1)

## 2020-07-18 LAB — CBC WITH DIFFERENTIAL (CANCER CENTER ONLY)
Abs Immature Granulocytes: 0.05 10*3/uL (ref 0.00–0.07)
Basophils Absolute: 0 10*3/uL (ref 0.0–0.1)
Basophils Relative: 1 %
Eosinophils Absolute: 0 10*3/uL (ref 0.0–0.5)
Eosinophils Relative: 0 %
HCT: 31 % — ABNORMAL LOW (ref 36.0–46.0)
Hemoglobin: 9.8 g/dL — ABNORMAL LOW (ref 12.0–15.0)
Immature Granulocytes: 1 %
Lymphocytes Relative: 26 %
Lymphs Abs: 1.4 10*3/uL (ref 0.7–4.0)
MCH: 30.4 pg (ref 26.0–34.0)
MCHC: 31.6 g/dL (ref 30.0–36.0)
MCV: 96.3 fL (ref 80.0–100.0)
Monocytes Absolute: 0.3 10*3/uL (ref 0.1–1.0)
Monocytes Relative: 6 %
Neutro Abs: 3.6 10*3/uL (ref 1.7–7.7)
Neutrophils Relative %: 66 %
Platelet Count: 343 10*3/uL (ref 150–400)
RBC: 3.22 MIL/uL — ABNORMAL LOW (ref 3.87–5.11)
RDW: 18.1 % — ABNORMAL HIGH (ref 11.5–15.5)
WBC Count: 5.4 10*3/uL (ref 4.0–10.5)
nRBC: 0 % (ref 0.0–0.2)

## 2020-07-18 MED ORDER — SODIUM CHLORIDE 0.9 % IV SOLN
800.0000 mg/m2 | Freq: Once | INTRAVENOUS | Status: AC
  Administered 2020-07-18: 1558 mg via INTRAVENOUS
  Filled 2020-07-18: qty 40.98

## 2020-07-18 MED ORDER — PACLITAXEL PROTEIN-BOUND CHEMO INJECTION 100 MG
100.0000 mg/m2 | Freq: Once | INTRAVENOUS | Status: AC
  Administered 2020-07-18: 200 mg via INTRAVENOUS
  Filled 2020-07-18: qty 40

## 2020-07-18 MED ORDER — SODIUM CHLORIDE 0.9% FLUSH
10.0000 mL | INTRAVENOUS | Status: DC | PRN
  Administered 2020-07-18: 10 mL
  Filled 2020-07-18: qty 10

## 2020-07-18 MED ORDER — HEPARIN SOD (PORK) LOCK FLUSH 100 UNIT/ML IV SOLN
500.0000 [IU] | Freq: Once | INTRAVENOUS | Status: AC | PRN
  Administered 2020-07-18: 500 [IU]
  Filled 2020-07-18: qty 5

## 2020-07-18 MED ORDER — PROCHLORPERAZINE MALEATE 10 MG PO TABS
10.0000 mg | ORAL_TABLET | Freq: Once | ORAL | Status: AC
  Administered 2020-07-18: 10 mg via ORAL

## 2020-07-18 MED ORDER — SODIUM CHLORIDE 0.9 % IV SOLN
Freq: Once | INTRAVENOUS | Status: AC
  Filled 2020-07-18: qty 250

## 2020-07-18 MED ORDER — PROCHLORPERAZINE MALEATE 10 MG PO TABS
ORAL_TABLET | ORAL | Status: AC
  Filled 2020-07-18: qty 1

## 2020-07-18 MED ORDER — SODIUM CHLORIDE 0.9% FLUSH
10.0000 mL | Freq: Once | INTRAVENOUS | Status: DC
  Filled 2020-07-18: qty 10

## 2020-07-18 NOTE — Patient Instructions (Signed)
[facility] Discharge Instructions for Patients Receiving Chemotherapy  Today you received the following chemotherapy agents Paclitaxel-protein bound (ABRAXANE) & Gemcitabine (GEMZAR).  To help prevent nausea and vomiting after your treatment, we encourage you to take your nausea medication as prescribed.   If you develop nausea and vomiting that is not controlled by your nausea medication, call the clinic.   BELOW ARE SYMPTOMS THAT SHOULD BE REPORTED IMMEDIATELY:  *FEVER GREATER THAN 100.5 F  *CHILLS WITH OR WITHOUT FEVER  NAUSEA AND VOMITING THAT IS NOT CONTROLLED WITH YOUR NAUSEA MEDICATION  *UNUSUAL SHORTNESS OF BREATH  *UNUSUAL BRUISING OR BLEEDING  TENDERNESS IN MOUTH AND THROAT WITH OR WITHOUT PRESENCE OF ULCERS  *URINARY PROBLEMS  *BOWEL PROBLEMS  UNUSUAL RASH Items with * indicate a potential emergency and should be followed up as soon as possible.  Feel free to call the clinic should you have any questions or concerns. The clinic phone number is [phone].  Please show the [facility] at check-in to the Emergency Department and triage nurse.

## 2020-07-18 NOTE — Progress Notes (Signed)
[facility] OFFICE PROGRESS NOTE   Diagnosis: Ampullary carcinoma  INTERVAL HISTORY:   Kaylee Mullins completed another treatment with gemcitabine/Abraxane on 06/27/2020.  No fever, cough, or rash.  No change in neuropathy symptoms.  She does not have significant pain at present.  She is not taking oxycodone on a regular basis.  Kaylee Mullins continues to live independently.  Objective:  Vital signs in last 24 hours:  Blood pressure 127/68, pulse 61, temperature 97.9 F (36.6 C), temperature source Tympanic, resp. rate 18, height 5\' 4"  (1.626 m), weight 188 lb 8 oz (85.5 kg), SpO2 100 %.    HEENT: No thrush or ulcers Resp: Scattered end inspiratory rhonchi at the left greater than right posterior chest, no respiratory distress Cardio: Regular rate and rhythm GI: Nontender, no mass, no hepatosplenomegaly, no apparent ascites Vascular: Trace-1+ edema at the lower leg bilaterally    Portacath/PICC-without erythema  Lab Results:  Lab Results  Component Value Date   WBC 5.4 07/18/2020   HGB 9.8 (L) 07/18/2020   HCT 31.0 (L) 07/18/2020   MCV 96.3 07/18/2020   PLT 343 07/18/2020   NEUTROABS 3.6 07/18/2020    CMP  Lab Results  Component Value Date   NA 140 06/27/2020   K 3.9 06/27/2020   CL 104 06/27/2020   CO2 28 06/27/2020   GLUCOSE 241 (H) 06/27/2020   BUN 18 06/27/2020   CREATININE 1.00 06/27/2020   CALCIUM 8.4 (L) 06/27/2020   PROT 6.8 06/27/2020   ALBUMIN 3.1 (L) 06/27/2020   AST 32 06/27/2020   ALT 25 06/27/2020   ALKPHOS 208 (H) 06/27/2020   BILITOT 1.3 (H) 06/27/2020   GFRNONAA 57 (L) 06/27/2020   GFRAA >60 05/16/2020    Medications: I have reviewed the patient's current medications.   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 CT abdomen/pelvis 10/23/2016-ampullary mass, single mildly enlarged porta hepatis lymph node, no evidence of  distant metastatic disease ? Status post pancreaticoduodenectomy 01/12/2017; pT3pN2 ? CT abdomen/pelvis 03/30/2018-ablation defect within the upper pole of the right kidney. No evidence of recurrent renal mass. New mild left periaortic retroperitoneal lymphadenopathy. ? CT abdomen/pelvis 06/30/2018-6 mm right middle lobe nodule-slowly growing over multiple CTs, stable ablation defect in the right kidney, 1.9 cm left periaortic node increased from 1.2 cm, stable 1.5 cm porta hepatis node ? CT abdomen/pelvis 09/17/2018-surgical changes related to Whipple procedure. Stable upper abdominal/retroperitoneal lymphadenopathy. 5 mm right middle lobe nodule, stable from most recent CT but mildly progressive from priors. Postprocedural changes related to renal ablation in the medial right upper kidney. ? CTs 03/24/2019-a few scattered pulmonary nodules appears stable; liver has a slightly shrunken appearance and nodular appearance compatible with mild cirrhosis. No suspicious cystic or solid hepatic lesions. Upper abdominal and retroperitoneal lymphadenopathy appears similar compared to the prior study. No other new lymphadenopathy noted elsewhere in the abdomen or pelvis. ? PET scan 07/03/2019-hypermetabolic lymph node adjacent porta hepatis. Newfocal hypermetabolic lesion in the liver. Hypermetabolic activity in the left suprarenal location favored metastatic adenopathy in the retroperitoneum. Diffuse hypermetabolic activity within the stomach favor gastritis. ? Cycle 1 gemcitabine/Abraxane 08/31/2019 ? Cycle 2 gemcitabine/Abraxane 09/14/2019 ? Cycle 3 gemcitabine/Abraxane 09/29/2019 ? Cycle 4 gemcitabine/Abraxane 10/13/2019 ? Cycle 5 gemcitabine/Abraxane 10/27/2019 ? Cycle 6 gemcitabine/Abraxane 11/09/2019 ? CTs 11/13/2019-new 2 mm right apical and 3 mm left lower lobe nodules, stable portacaval node, enlargement of amorphous soft tissue encasing the left renal artery, stable liver  lesion ? Cycle 7  gemcitabine/Abraxane 11/24/2019 ? Cycle 8 gemcitabine/Abraxane 12/07/2019 ? 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 Cycle 10 gemcitabine/Abraxane 01/05/2020 ? Cycle 11 gemcitabine/Abraxane 01/18/2020 ? CTs 01/30/2020-small right pleural effusion. Rounded peripheral mass along the margin of the right lower lobe posteriorly adjacent to the pleural effusion 3.0 x 1.9 cm compared to previous measurement of 3.0 x 1.9 cm. Stable 4 mm right upper lobe nodule. Stable 0.6 x 0.5 cm right middle lobe nodule. Stable 3 mm nodule in the superior segment left lower lobe. No new nodules identified. Reduced conspicuity of margins of the moderately heterogeneous anterior liver lesion measuring 5.2 x 2.5 x 4.4 cm, previous measurement 3.0 x 2.4 x 3.4 cm. Nodular contour of the liver. Region of hypodensity in segment 5 measuring 2.6 x 1.8 cm, previously 2.0 x 1.3 cm. Portacaval node 1.2 cm, previously 1.3 cm. Porta hepatis node 0.9 cm, formerly 1.3 cm. Abnormal indistinctly marginated soft tissue density probably reflecting nodal tissue surrounding the left proximal renal artery measuring 1.8 cm, formerly 1.9 cm. ? Cycle 12 gemcitabine/Abraxane 02/01/2020 ? Cycle13gemcitabine/Abraxane 02/16/2020 ? Cycle 14 gemcitabine/Abraxane 02/29/2020 ? 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 Cycle 16 gemcitabine 04/05/2020 (Abraxane held due to neuropathy) ? Cycle 17 gemcitabine 04/18/2020 (Abraxane held due to neuropathy) ? CTs 04/30/2020-previously noted pleural-based masslike lesion in the posterior aspect of the right lower lobe with slow increase in size over numerous prior examinations. Slight regression of multiple liver metastases. No new liver lesions noted. Stable left para-aortic lymphadenopathy. ? Continue every 2-week gemcitabine ? Cycle 18 gemcitabine 05/02/2020 (Abraxane held due to neuropathy) ? Cycle 19gemcitabine 05/16/2020 ? Cycle 20 gemcitabine/Abraxane  05/30/2020 ? Cycle 21 gemcitabine/Abraxane 06/14/2020 ? Cycle 22 gemcitabine/Abraxane 06/27/2020 ? Cycle 23 gemcitabine/Abraxane 07/18/2020 2. Biliary obstruction secondary to #1, status post placement of a metal, bile duct stent on 10/23/2016  3. 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 Renal cell carcinoma-status post ablation of a right renal mass 07/15/2016, biopsy confirmed papillary renal cell carcinoma, Fuhrman grade 3  5. CVA in 2016  6. History of thyroid cancer-status post thyroidectomy and radioactive iodine in 2005  7. Diabetes  8.Hypertension  9.Left lower extremity deep vein thrombosis June 2019, IVC filter placed July 2018 after she was diagnosed with a rectus hematoma while on Lovenox  10. Hospitalized 04/12/2019 through 04/14/2019 with traumatic hematoma right lower leg, followed at the wound clinic  11.Port-A-Cath placement interventional radiology 08/29/2019  12.Covid vaccine-second injection administered 11/10/2019; booster 04/05/2020    Disposition: Kaylee Mullins appears stable.  She is tolerating the gemcitabine/Abraxane well.  She will complete another cycle today.  The CA 19-9 has been stable over the past few months.  We will follow up on the CA 19-9 from today.  She will return for an office visit and chemotherapy in 2 weeks.  Betsy Coder, MD  07/18/2020  12:18 PM

## 2020-07-19 ENCOUNTER — Telehealth: Payer: Self-pay | Admitting: Oncology

## 2020-07-19 LAB — CANCER ANTIGEN 19-9: CA 19-9: 9360 U/mL — ABNORMAL HIGH (ref 0–35)

## 2020-07-19 NOTE — Telephone Encounter (Signed)
Scheduled appointments per 12/2 los. Will have updated calendar printed for patient at next visit.

## 2020-07-28 ENCOUNTER — Other Ambulatory Visit: Payer: Self-pay | Admitting: Oncology

## 2020-08-02 ENCOUNTER — Inpatient Hospital Stay: Payer: Medicare Other

## 2020-08-02 ENCOUNTER — Inpatient Hospital Stay (HOSPITAL_BASED_OUTPATIENT_CLINIC_OR_DEPARTMENT_OTHER): Payer: Medicare Other | Admitting: Oncology

## 2020-08-02 ENCOUNTER — Other Ambulatory Visit: Payer: Self-pay

## 2020-08-02 VITALS — BP 114/65 | HR 62 | Temp 97.8°F | Resp 19 | Ht 64.0 in | Wt 187.5 lb

## 2020-08-02 DIAGNOSIS — C241 Malignant neoplasm of ampulla of Vater: Secondary | ICD-10-CM

## 2020-08-02 DIAGNOSIS — C801 Malignant (primary) neoplasm, unspecified: Secondary | ICD-10-CM

## 2020-08-02 DIAGNOSIS — Z5111 Encounter for antineoplastic chemotherapy: Secondary | ICD-10-CM | POA: Diagnosis not present

## 2020-08-02 DIAGNOSIS — Z95828 Presence of other vascular implants and grafts: Secondary | ICD-10-CM

## 2020-08-02 LAB — CMP (CANCER CENTER ONLY)
ALT: 18 U/L (ref 0–44)
AST: 25 U/L (ref 15–41)
Albumin: 2.6 g/dL — ABNORMAL LOW (ref 3.5–5.0)
Alkaline Phosphatase: 190 U/L — ABNORMAL HIGH (ref 38–126)
Anion gap: 8 (ref 5–15)
BUN: 21 mg/dL (ref 8–23)
CO2: 27 mmol/L (ref 22–32)
Calcium: 8.4 mg/dL — ABNORMAL LOW (ref 8.9–10.3)
Chloride: 105 mmol/L (ref 98–111)
Creatinine: 0.88 mg/dL (ref 0.44–1.00)
GFR, Estimated: >60 mL/min (ref >60)
Glucose, Bld: 187 mg/dL — ABNORMAL HIGH (ref 70–99)
Potassium: 3.9 mmol/L (ref 3.5–5.1)
Sodium: 140 mmol/L (ref 135–145)
Total Bilirubin: 0.7 mg/dL (ref 0.3–1.2)
Total Protein: 6.8 g/dL (ref 6.5–8.1)

## 2020-08-02 LAB — CBC WITH DIFFERENTIAL (CANCER CENTER ONLY)
Abs Immature Granulocytes: 0.06 10*3/uL (ref 0.00–0.07)
Basophils Absolute: 0 10*3/uL (ref 0.0–0.1)
Basophils Relative: 0 %
Eosinophils Absolute: 0.1 10*3/uL (ref 0.0–0.5)
Eosinophils Relative: 1 %
HCT: 28.2 % — ABNORMAL LOW (ref 36.0–46.0)
Hemoglobin: 9.1 g/dL — ABNORMAL LOW (ref 12.0–15.0)
Immature Granulocytes: 1 %
Lymphocytes Relative: 18 %
Lymphs Abs: 1.3 10*3/uL (ref 0.7–4.0)
MCH: 30.8 pg (ref 26.0–34.0)
MCHC: 32.3 g/dL (ref 30.0–36.0)
MCV: 95.6 fL (ref 80.0–100.0)
Monocytes Absolute: 0.6 10*3/uL (ref 0.1–1.0)
Monocytes Relative: 9 %
Neutro Abs: 5.2 10*3/uL (ref 1.7–7.7)
Neutrophils Relative %: 71 %
Platelet Count: 369 10*3/uL (ref 150–400)
RBC: 2.95 MIL/uL — ABNORMAL LOW (ref 3.87–5.11)
RDW: 17.8 % — ABNORMAL HIGH (ref 11.5–15.5)
WBC Count: 7.3 10*3/uL (ref 4.0–10.5)
nRBC: 0 % (ref 0.0–0.2)

## 2020-08-02 MED ORDER — SODIUM CHLORIDE 0.9% FLUSH
10.0000 mL | INTRAVENOUS | Status: DC | PRN
  Administered 2020-08-02: 16:00:00 10 mL
  Filled 2020-08-02: qty 10

## 2020-08-02 MED ORDER — SODIUM CHLORIDE 0.9% FLUSH
10.0000 mL | Freq: Once | INTRAVENOUS | Status: AC
  Administered 2020-08-02: 12:00:00 10 mL
  Filled 2020-08-02: qty 10

## 2020-08-02 MED ORDER — SODIUM CHLORIDE 0.9 % IV SOLN
800.0000 mg/m2 | Freq: Once | INTRAVENOUS | Status: AC
  Administered 2020-08-02: 16:00:00 1558 mg via INTRAVENOUS
  Filled 2020-08-02: qty 40.98

## 2020-08-02 MED ORDER — PROCHLORPERAZINE MALEATE 10 MG PO TABS
ORAL_TABLET | ORAL | Status: AC
  Filled 2020-08-02: qty 1

## 2020-08-02 MED ORDER — SODIUM CHLORIDE 0.9 % IV SOLN
Freq: Once | INTRAVENOUS | Status: AC
  Filled 2020-08-02: qty 250

## 2020-08-02 MED ORDER — PACLITAXEL PROTEIN-BOUND CHEMO INJECTION 100 MG
100.0000 mg/m2 | Freq: Once | INTRAVENOUS | Status: AC
  Administered 2020-08-02: 15:00:00 200 mg via INTRAVENOUS
  Filled 2020-08-02: qty 40

## 2020-08-02 MED ORDER — HEPARIN SOD (PORK) LOCK FLUSH 100 UNIT/ML IV SOLN
500.0000 [IU] | Freq: Once | INTRAVENOUS | Status: AC | PRN
  Administered 2020-08-02: 16:00:00 500 [IU]
  Filled 2020-08-02: qty 5

## 2020-08-02 MED ORDER — PROCHLORPERAZINE MALEATE 10 MG PO TABS
10.0000 mg | ORAL_TABLET | Freq: Once | ORAL | Status: AC
  Administered 2020-08-02: 13:00:00 10 mg via ORAL

## 2020-08-02 NOTE — Patient Instructions (Signed)
[facility] Discharge Instructions for Patients Receiving Chemotherapy  Today you received the following chemotherapy agents: abraxane/gemzar.  To help prevent nausea and vomiting after your treatment, we encourage you to take your nausea medication as directed.   If you develop nausea and vomiting that is not controlled by your nausea medication, call the clinic.   BELOW ARE SYMPTOMS THAT SHOULD BE REPORTED IMMEDIATELY:  *FEVER GREATER THAN 100.5 F  *CHILLS WITH OR WITHOUT FEVER  NAUSEA AND VOMITING THAT IS NOT CONTROLLED WITH YOUR NAUSEA MEDICATION  *UNUSUAL SHORTNESS OF BREATH  *UNUSUAL BRUISING OR BLEEDING  TENDERNESS IN MOUTH AND THROAT WITH OR WITHOUT PRESENCE OF ULCERS  *URINARY PROBLEMS  *BOWEL PROBLEMS  UNUSUAL RASH Items with * indicate a potential emergency and should be followed up as soon as possible.  Feel free to call the clinic should you have any questions or concerns. The clinic phone number is [phone].  Please show the [facility] at check-in to the Emergency Department and triage nurse.

## 2020-08-02 NOTE — Progress Notes (Signed)
[place] OFFICE PROGRESS NOTE   Diagnosis: Ampullary carcinoma  INTERVAL HISTORY:   Kaylee Mullins completed another treatment with gemcitabine/Abraxane on 07/18/2020.  No fever or rash.  No change in neuropathy symptoms.  She continues to have intermittent abdominal pain.  She is not taking narcotic analgesics.  She uses Tylenol as needed.  She developed a "sore "at the right buttock a few weeks ago.  Objective:  Vital signs in last 24 hours:  Blood pressure 114/65, pulse 62, temperature 97.8 F (36.6 C), temperature source Tympanic, resp. rate 19, height 5\' 4"  (1.626 m), weight 187 lb 8 oz (85 kg), SpO2 100 %.   Resp: Lungs clear bilaterally Cardio: Regular rate and rhythm GI: Nontender, no mass, no hepatosplenomegaly Vascular: Trace edema at the left greater than right lower leg and foot  Skin: Crusted/resolving round lesions at the right sacrum extending to the upper right thigh  Portacath/PICC-without erythema  Lab Results:  Lab Results  Component Value Date   WBC 7.3 08/02/2020   HGB 9.1 (L) 08/02/2020   HCT 28.2 (L) 08/02/2020   MCV 95.6 08/02/2020   PLT 369 08/02/2020   NEUTROABS 5.2 08/02/2020    CMP  Lab Results  Component Value Date   NA 142 07/18/2020   K 3.6 07/18/2020   CL 110 07/18/2020   CO2 24 07/18/2020   GLUCOSE 222 (H) 07/18/2020   BUN 21 07/18/2020   CREATININE 0.99 07/18/2020   CALCIUM 8.3 (L) 07/18/2020   PROT 6.8 07/18/2020   ALBUMIN 2.9 (L) 07/18/2020   AST 39 07/18/2020   ALT 20 07/18/2020   ALKPHOS 204 (H) 07/18/2020   BILITOT 0.4 07/18/2020   GFRNONAA 57 (L) 07/18/2020   GFRAA >60 05/16/2020     Medications: I have reviewed the patient's current medications.   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 CT abdomen/pelvis 10/23/2016-ampullary mass, single mildly enlarged porta hepatis lymph node, no evidence  of distant metastatic disease ? Status post pancreaticoduodenectomy 01/12/2017; pT3pN2 ? CT abdomen/pelvis 03/30/2018-ablation defect within the upper pole of the right kidney. No evidence of recurrent renal mass. New mild left periaortic retroperitoneal lymphadenopathy. ? CT abdomen/pelvis 06/30/2018-6 mm right middle lobe nodule-slowly growing over multiple CTs, stable ablation defect in the right kidney, 1.9 cm left periaortic node increased from 1.2 cm, stable 1.5 cm porta hepatis node ? CT abdomen/pelvis 09/17/2018-surgical changes related to Whipple procedure. Stable upper abdominal/retroperitoneal lymphadenopathy. 5 mm right middle lobe nodule, stable from most recent CT but mildly progressive from priors. Postprocedural changes related to renal ablation in the medial right upper kidney. ? CTs 03/24/2019-a few scattered pulmonary nodules appears stable; liver has a slightly shrunken appearance and nodular appearance compatible with mild cirrhosis. No suspicious cystic or solid hepatic lesions. Upper abdominal and retroperitoneal lymphadenopathy appears similar compared to the prior study. No other new lymphadenopathy noted elsewhere in the abdomen or pelvis. ? PET scan 07/03/2019-hypermetabolic lymph node adjacent porta hepatis. Newfocal hypermetabolic lesion in the liver. Hypermetabolic activity in the left suprarenal location favored metastatic adenopathy in the retroperitoneum. Diffuse hypermetabolic activity within the stomach favor gastritis. ? Cycle 1 gemcitabine/Abraxane 08/31/2019 ? Cycle 2 gemcitabine/Abraxane 09/14/2019 ? Cycle 3 gemcitabine/Abraxane 09/29/2019 ? Cycle 4 gemcitabine/Abraxane 10/13/2019 ? Cycle 5 gemcitabine/Abraxane 10/27/2019 ? Cycle 6 gemcitabine/Abraxane 11/09/2019 ? CTs 11/13/2019-new 2 mm right apical and 3 mm left lower lobe nodules, stable portacaval node, enlargement of amorphous soft tissue encasing the left  renal artery, stable liver lesion ? Cycle 7  gemcitabine/Abraxane 11/24/2019 ? Cycle 8 gemcitabine/Abraxane 12/07/2019 ? 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 Cycle 10 gemcitabine/Abraxane 01/05/2020 ? Cycle 11 gemcitabine/Abraxane 01/18/2020 ? CTs 01/30/2020-small right pleural effusion. Rounded peripheral mass along the margin of the right lower lobe posteriorly adjacent to the pleural effusion 3.0 x 1.9 cm compared to previous measurement of 3.0 x 1.9 cm. Stable 4 mm right upper lobe nodule. Stable 0.6 x 0.5 cm right middle lobe nodule. Stable 3 mm nodule in the superior segment left lower lobe. No new nodules identified. Reduced conspicuity of margins of the moderately heterogeneous anterior liver lesion measuring 5.2 x 2.5 x 4.4 cm, previous measurement 3.0 x 2.4 x 3.4 cm. Nodular contour of the liver. Region of hypodensity in segment 5 measuring 2.6 x 1.8 cm, previously 2.0 x 1.3 cm. Portacaval node 1.2 cm, previously 1.3 cm. Porta hepatis node 0.9 cm, formerly 1.3 cm. Abnormal indistinctly marginated soft tissue density probably reflecting nodal tissue surrounding the left proximal renal artery measuring 1.8 cm, formerly 1.9 cm. ? Cycle 12 gemcitabine/Abraxane 02/01/2020 ? Cycle13gemcitabine/Abraxane 02/16/2020 ? Cycle 14 gemcitabine/Abraxane 02/29/2020 ? 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 Cycle 16 gemcitabine 04/05/2020 (Abraxane held due to neuropathy) ? Cycle 17 gemcitabine 04/18/2020 (Abraxane held due to neuropathy) ? CTs 04/30/2020-previously noted pleural-based masslike lesion in the posterior aspect of the right lower lobe with slow increase in size over numerous prior examinations. Slight regression of multiple liver metastases. No new liver lesions noted. Stable left para-aortic lymphadenopathy. ? Continue every 2-week gemcitabine ? Cycle 18 gemcitabine 05/02/2020 (Abraxane held due to neuropathy) ? Cycle 19gemcitabine 05/16/2020 ? Cycle 20 gemcitabine/Abraxane  05/30/2020 ? Cycle 21 gemcitabine/Abraxane 06/14/2020 ? Cycle 22 gemcitabine/Abraxane 06/27/2020 ? Cycle 23 gemcitabine/Abraxane 07/18/2020 ? Cycle 24 gemcitabine/Abraxane 08/02/2020 2. Biliary obstruction secondary to #1, status post placement of a metal, bile duct stent on 10/23/2016  3. 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 Renal cell carcinoma-status post ablation of a right renal mass 07/15/2016, biopsy confirmed papillary renal cell carcinoma, Fuhrman grade 3  5. CVA in 2016  6. History of thyroid cancer-status post thyroidectomy and radioactive iodine in 2005  7. Diabetes  8.Hypertension  9.Left lower extremity deep vein thrombosis June 2019, IVC filter placed July 2018 after she was diagnosed with a rectus hematoma while on Lovenox  10. Hospitalized 04/12/2019 through 04/14/2019 with traumatic hematoma right lower leg, followed at the wound clinic  11.Port-A-Cath placement interventional radiology 08/29/2019  12.Covid vaccine-second injection administered 11/10/2019; booster 04/05/2020  13.  Resolving right buttock rash 08/02/2020-likely a zoster rash      Disposition: Ms. Lengacher appears stable.  She is tolerating the gemcitabine/Abraxane well.  There is no clinical evidence of disease progression.  The CA 19-9 was slightly higher on 07/18/2020.  She will complete another treatment with gemcitabine/Abraxane today.  Ms. Degan will return for an office visit and chemotherapy in 2 weeks.  The plan is to schedule a restaging CT prior to the scheduled treatment on 08/30/2020.  She appears to have a resolving zoster rash at the right buttock.  Betsy Coder, MD  08/02/2020  12:33 PM

## 2020-08-03 LAB — CANCER ANTIGEN 19-9: CA 19-9: 7772 U/mL — ABNORMAL HIGH (ref 0–35)

## 2020-08-05 ENCOUNTER — Telehealth: Payer: Self-pay | Admitting: *Deleted

## 2020-08-05 NOTE — Telephone Encounter (Signed)
Reports some shortness of breath yesterday and last night w/exertion. Not as bad today. Confirmed she is afebrile and not coughing. Her legs and feet as swollen, but not as much as when she saw Dr. Benay Spice because she has not gotten out of bed yet today. She has not taken her Lasix in several days.  Reminded her that her heart failure could be causing the shortness of breath. Instructed her to take her 80 mg Lasix today w/K+ and call tomorrow if not better.

## 2020-08-09 ENCOUNTER — Other Ambulatory Visit: Payer: Self-pay

## 2020-08-09 ENCOUNTER — Emergency Department (HOSPITAL_COMMUNITY): Payer: Medicare Other

## 2020-08-09 ENCOUNTER — Encounter (HOSPITAL_COMMUNITY): Payer: Self-pay | Admitting: Internal Medicine

## 2020-08-09 ENCOUNTER — Inpatient Hospital Stay (HOSPITAL_COMMUNITY)
Admission: EM | Admit: 2020-08-09 | Discharge: 2020-08-20 | DRG: 291 | Disposition: A | Discharge status: Skilled Nursing Facility | Payer: Medicare Other | Attending: Internal Medicine | Admitting: Internal Medicine

## 2020-08-09 DIAGNOSIS — Z8507 Personal history of malignant neoplasm of pancreas: Secondary | ICD-10-CM

## 2020-08-09 DIAGNOSIS — Z79890 Hormone replacement therapy (postmenopausal): Secondary | ICD-10-CM

## 2020-08-09 DIAGNOSIS — R7401 Elevation of levels of liver transaminase levels: Secondary | ICD-10-CM

## 2020-08-09 DIAGNOSIS — I351 Nonrheumatic aortic (valve) insufficiency: Secondary | ICD-10-CM

## 2020-08-09 DIAGNOSIS — Z7901 Long term (current) use of anticoagulants: Secondary | ICD-10-CM

## 2020-08-09 DIAGNOSIS — R7989 Other specified abnormal findings of blood chemistry: Secondary | ICD-10-CM | POA: Diagnosis present

## 2020-08-09 DIAGNOSIS — N179 Acute kidney failure, unspecified: Secondary | ICD-10-CM | POA: Diagnosis present

## 2020-08-09 DIAGNOSIS — Z20822 Contact with and (suspected) exposure to covid-19: Secondary | ICD-10-CM | POA: Diagnosis present

## 2020-08-09 DIAGNOSIS — R778 Other specified abnormalities of plasma proteins: Secondary | ICD-10-CM | POA: Diagnosis not present

## 2020-08-09 DIAGNOSIS — Z923 Personal history of irradiation: Secondary | ICD-10-CM

## 2020-08-09 DIAGNOSIS — I69354 Hemiplegia and hemiparesis following cerebral infarction affecting left non-dominant side: Secondary | ICD-10-CM

## 2020-08-09 DIAGNOSIS — J9811 Atelectasis: Secondary | ICD-10-CM | POA: Diagnosis present

## 2020-08-09 DIAGNOSIS — E860 Dehydration: Principal | ICD-10-CM | POA: Diagnosis present

## 2020-08-09 DIAGNOSIS — K219 Gastro-esophageal reflux disease without esophagitis: Secondary | ICD-10-CM | POA: Diagnosis present

## 2020-08-09 DIAGNOSIS — Z888 Allergy status to other drugs, medicaments and biological substances status: Secondary | ICD-10-CM

## 2020-08-09 DIAGNOSIS — R29709 NIHSS score 9: Secondary | ICD-10-CM | POA: Diagnosis present

## 2020-08-09 DIAGNOSIS — R0602 Shortness of breath: Secondary | ICD-10-CM | POA: Diagnosis not present

## 2020-08-09 DIAGNOSIS — Z8509 Personal history of malignant neoplasm of other digestive organs: Secondary | ICD-10-CM

## 2020-08-09 DIAGNOSIS — C779 Secondary and unspecified malignant neoplasm of lymph node, unspecified: Secondary | ICD-10-CM | POA: Diagnosis present

## 2020-08-09 DIAGNOSIS — Z85528 Personal history of other malignant neoplasm of kidney: Secondary | ICD-10-CM

## 2020-08-09 DIAGNOSIS — E1122 Type 2 diabetes mellitus with diabetic chronic kidney disease: Secondary | ICD-10-CM | POA: Diagnosis present

## 2020-08-09 DIAGNOSIS — I2510 Atherosclerotic heart disease of native coronary artery without angina pectoris: Secondary | ICD-10-CM | POA: Diagnosis present

## 2020-08-09 DIAGNOSIS — Z794 Long term (current) use of insulin: Secondary | ICD-10-CM

## 2020-08-09 DIAGNOSIS — B9620 Unspecified Escherichia coli [E. coli] as the cause of diseases classified elsewhere: Secondary | ICD-10-CM | POA: Diagnosis present

## 2020-08-09 DIAGNOSIS — D638 Anemia in other chronic diseases classified elsewhere: Secondary | ICD-10-CM | POA: Diagnosis present

## 2020-08-09 DIAGNOSIS — Z90710 Acquired absence of both cervix and uterus: Secondary | ICD-10-CM

## 2020-08-09 DIAGNOSIS — Z79899 Other long term (current) drug therapy: Secondary | ICD-10-CM

## 2020-08-09 DIAGNOSIS — J9621 Acute and chronic respiratory failure with hypoxia: Secondary | ICD-10-CM | POA: Diagnosis present

## 2020-08-09 DIAGNOSIS — H9190 Unspecified hearing loss, unspecified ear: Secondary | ICD-10-CM | POA: Diagnosis present

## 2020-08-09 DIAGNOSIS — Z86718 Personal history of other venous thrombosis and embolism: Secondary | ICD-10-CM

## 2020-08-09 DIAGNOSIS — Z90411 Acquired partial absence of pancreas: Secondary | ICD-10-CM

## 2020-08-09 DIAGNOSIS — Y929 Unspecified place or not applicable: Secondary | ICD-10-CM

## 2020-08-09 DIAGNOSIS — I471 Supraventricular tachycardia: Secondary | ICD-10-CM | POA: Diagnosis present

## 2020-08-09 DIAGNOSIS — N390 Urinary tract infection, site not specified: Secondary | ICD-10-CM | POA: Diagnosis present

## 2020-08-09 DIAGNOSIS — R791 Abnormal coagulation profile: Secondary | ICD-10-CM | POA: Diagnosis not present

## 2020-08-09 DIAGNOSIS — Z981 Arthrodesis status: Secondary | ICD-10-CM

## 2020-08-09 DIAGNOSIS — E785 Hyperlipidemia, unspecified: Secondary | ICD-10-CM | POA: Diagnosis present

## 2020-08-09 DIAGNOSIS — I639 Cerebral infarction, unspecified: Secondary | ICD-10-CM | POA: Diagnosis present

## 2020-08-09 DIAGNOSIS — R5383 Other fatigue: Secondary | ICD-10-CM | POA: Diagnosis not present

## 2020-08-09 DIAGNOSIS — I130 Hypertensive heart and chronic kidney disease with heart failure and stage 1 through stage 4 chronic kidney disease, or unspecified chronic kidney disease: Secondary | ICD-10-CM | POA: Diagnosis not present

## 2020-08-09 DIAGNOSIS — F32A Depression, unspecified: Secondary | ICD-10-CM | POA: Diagnosis present

## 2020-08-09 DIAGNOSIS — C241 Malignant neoplasm of ampulla of Vater: Secondary | ICD-10-CM | POA: Diagnosis present

## 2020-08-09 DIAGNOSIS — I959 Hypotension, unspecified: Secondary | ICD-10-CM | POA: Diagnosis not present

## 2020-08-09 DIAGNOSIS — I700 Atherosclerosis of aorta: Secondary | ICD-10-CM | POA: Diagnosis present

## 2020-08-09 DIAGNOSIS — Z9841 Cataract extraction status, right eye: Secondary | ICD-10-CM

## 2020-08-09 DIAGNOSIS — I5033 Acute on chronic diastolic (congestive) heart failure: Secondary | ICD-10-CM | POA: Diagnosis present

## 2020-08-09 DIAGNOSIS — Z8249 Family history of ischemic heart disease and other diseases of the circulatory system: Secondary | ICD-10-CM

## 2020-08-09 DIAGNOSIS — Z9842 Cataract extraction status, left eye: Secondary | ICD-10-CM

## 2020-08-09 DIAGNOSIS — Z85850 Personal history of malignant neoplasm of thyroid: Secondary | ICD-10-CM

## 2020-08-09 DIAGNOSIS — Z9049 Acquired absence of other specified parts of digestive tract: Secondary | ICD-10-CM

## 2020-08-09 DIAGNOSIS — R109 Unspecified abdominal pain: Secondary | ICD-10-CM

## 2020-08-09 DIAGNOSIS — T451X5A Adverse effect of antineoplastic and immunosuppressive drugs, initial encounter: Secondary | ICD-10-CM | POA: Diagnosis present

## 2020-08-09 DIAGNOSIS — F419 Anxiety disorder, unspecified: Secondary | ICD-10-CM | POA: Diagnosis present

## 2020-08-09 DIAGNOSIS — E875 Hyperkalemia: Secondary | ICD-10-CM | POA: Diagnosis present

## 2020-08-09 DIAGNOSIS — C787 Secondary malignant neoplasm of liver and intrahepatic bile duct: Secondary | ICD-10-CM | POA: Diagnosis present

## 2020-08-09 DIAGNOSIS — Z7401 Bed confinement status: Secondary | ICD-10-CM

## 2020-08-09 DIAGNOSIS — N1831 Chronic kidney disease, stage 3a: Secondary | ICD-10-CM | POA: Diagnosis present

## 2020-08-09 DIAGNOSIS — T501X5A Adverse effect of loop [high-ceiling] diuretics, initial encounter: Secondary | ICD-10-CM | POA: Diagnosis present

## 2020-08-09 LAB — TROPONIN I (HIGH SENSITIVITY)
Troponin I (High Sensitivity): 124 ng/L (ref <18)
Troponin I (High Sensitivity): 115 ng/L (ref <18)
Troponin I (High Sensitivity): 93 ng/L — ABNORMAL HIGH (ref <18)

## 2020-08-09 LAB — HEPATIC FUNCTION PANEL
ALT: 25 U/L (ref 0–44)
AST: 61 U/L — ABNORMAL HIGH (ref 15–41)
Albumin: 1.9 g/dL — ABNORMAL LOW (ref 3.5–5.0)
Alkaline Phosphatase: 125 U/L (ref 38–126)
Bilirubin, Direct: 0.2 mg/dL (ref 0.0–0.2)
Indirect Bilirubin: 0.6 mg/dL (ref 0.3–0.9)
Total Bilirubin: 0.8 mg/dL (ref 0.3–1.2)
Total Protein: 6.1 g/dL — ABNORMAL LOW (ref 6.5–8.1)

## 2020-08-09 LAB — BASIC METABOLIC PANEL
Anion gap: 12 (ref 5–15)
BUN: 30 mg/dL — ABNORMAL HIGH (ref 8–23)
CO2: 25 mmol/L (ref 22–32)
Calcium: 7.9 mg/dL — ABNORMAL LOW (ref 8.9–10.3)
Chloride: 100 mmol/L (ref 98–111)
Creatinine, Ser: 1.22 mg/dL — ABNORMAL HIGH (ref 0.44–1.00)
GFR, Estimated: 44 mL/min — ABNORMAL LOW (ref >60)
Glucose, Bld: 400 mg/dL — ABNORMAL HIGH (ref 70–99)
Potassium: 3.8 mmol/L (ref 3.5–5.1)
Sodium: 137 mmol/L (ref 135–145)

## 2020-08-09 LAB — CBC WITH DIFFERENTIAL/PLATELET
Abs Immature Granulocytes: 0.3 10*3/uL — ABNORMAL HIGH (ref 0.00–0.07)
Basophils Absolute: 0 10*3/uL (ref 0.0–0.1)
Basophils Relative: 0 %
Eosinophils Absolute: 0 10*3/uL (ref 0.0–0.5)
Eosinophils Relative: 0 %
HCT: 28.5 % — ABNORMAL LOW (ref 36.0–46.0)
Hemoglobin: 9.3 g/dL — ABNORMAL LOW (ref 12.0–15.0)
Immature Granulocytes: 3 %
Lymphocytes Relative: 6 %
Lymphs Abs: 0.6 10*3/uL — ABNORMAL LOW (ref 0.7–4.0)
MCH: 31.1 pg (ref 26.0–34.0)
MCHC: 32.6 g/dL (ref 30.0–36.0)
MCV: 95.3 fL (ref 80.0–100.0)
Monocytes Absolute: 0.6 10*3/uL (ref 0.1–1.0)
Monocytes Relative: 7 %
Neutro Abs: 8.3 10*3/uL — ABNORMAL HIGH (ref 1.7–7.7)
Neutrophils Relative %: 84 %
Platelets: 153 10*3/uL (ref 150–400)
RBC: 2.99 MIL/uL — ABNORMAL LOW (ref 3.87–5.11)
RDW: 18.2 % — ABNORMAL HIGH (ref 11.5–15.5)
WBC: 9.9 10*3/uL (ref 4.0–10.5)
nRBC: 0.4 % — ABNORMAL HIGH (ref 0.0–0.2)

## 2020-08-09 LAB — BLOOD GAS, VENOUS
Acid-Base Excess: 5.2 mmol/L — ABNORMAL HIGH (ref 0.0–2.0)
Bicarbonate: 29.7 mmol/L — ABNORMAL HIGH (ref 20.0–28.0)
FIO2: 28
O2 Saturation: 67.7 %
Patient temperature: 37.4
pCO2, Ven: 48.5 mmHg (ref 44.0–60.0)
pH, Ven: 7.406 (ref 7.250–7.430)
pO2, Ven: 40.1 mmHg (ref 32.0–45.0)

## 2020-08-09 LAB — GLUCOSE, CAPILLARY: Glucose-Capillary: 397 mg/dL — ABNORMAL HIGH (ref 70–99)

## 2020-08-09 LAB — ECHOCARDIOGRAM COMPLETE
Area-P 1/2: 3.31 cm2
Height: 64 in
P 1/2 time: 397 msec
S' Lateral: 3.2 cm
Weight: 2976 oz

## 2020-08-09 LAB — PROTIME-INR
INR: 3.7 — ABNORMAL HIGH (ref 0.8–1.2)
Prothrombin Time: 35.6 seconds — ABNORMAL HIGH (ref 11.4–15.2)

## 2020-08-09 LAB — RESP PANEL BY RT-PCR (FLU A&B, COVID) ARPGX2
Influenza A by PCR: NEGATIVE
Influenza B by PCR: NEGATIVE
SARS Coronavirus 2 by RT PCR: NEGATIVE

## 2020-08-09 LAB — BRAIN NATRIURETIC PEPTIDE: B Natriuretic Peptide: 391.6 pg/mL — ABNORMAL HIGH (ref 0.0–100.0)

## 2020-08-09 MED ORDER — INSULIN DETEMIR 100 UNIT/ML ~~LOC~~ SOLN
7.0000 [IU] | Freq: Two times a day (BID) | SUBCUTANEOUS | Status: DC
  Administered 2020-08-09 – 2020-08-20 (×20): 7 [IU] via SUBCUTANEOUS
  Filled 2020-08-09 (×23): qty 0.07

## 2020-08-09 MED ORDER — CARVEDILOL 25 MG PO TABS
25.0000 mg | ORAL_TABLET | Freq: Two times a day (BID) | ORAL | Status: DC
  Administered 2020-08-09 – 2020-08-14 (×10): 25 mg via ORAL
  Filled 2020-08-09 (×11): qty 1

## 2020-08-09 MED ORDER — WARFARIN - PHARMACIST DOSING INPATIENT: Freq: Every day | Status: DC

## 2020-08-09 MED ORDER — SODIUM CHLORIDE 0.9% FLUSH: 3.0000 mL | INTRAVENOUS | Status: DC | PRN

## 2020-08-09 MED ORDER — SODIUM CHLORIDE 0.9% FLUSH
3.0000 mL | Freq: Two times a day (BID) | INTRAVENOUS | Status: DC
  Administered 2020-08-10 – 2020-08-19 (×14): 3 mL via INTRAVENOUS

## 2020-08-09 MED ORDER — PANTOPRAZOLE SODIUM 40 MG PO TBEC
40.0000 mg | DELAYED_RELEASE_TABLET | Freq: Every day | ORAL | Status: DC
  Administered 2020-08-09 – 2020-08-19 (×10): 40 mg via ORAL
  Filled 2020-08-09 (×10): qty 1

## 2020-08-09 MED ORDER — POLYETHYLENE GLYCOL 3350 17 G PO PACK
17.0000 g | PACK | Freq: Every day | ORAL | Status: DC | PRN
  Administered 2020-08-12: 13:00:00 17 g via ORAL
  Filled 2020-08-09: qty 1

## 2020-08-09 MED ORDER — LEVOTHYROXINE SODIUM 100 MCG PO TABS
200.0000 ug | ORAL_TABLET | Freq: Every day | ORAL | Status: DC
  Administered 2020-08-10 – 2020-08-20 (×9): 200 ug via ORAL
  Filled 2020-08-09 (×10): qty 2

## 2020-08-09 MED ORDER — ONDANSETRON HCL 4 MG PO TABS: 8.0000 mg | ORAL_TABLET | Freq: Three times a day (TID) | ORAL | Status: DC | PRN

## 2020-08-09 MED ORDER — INSULIN ASPART 100 UNIT/ML ~~LOC~~ SOLN
0.0000 [IU] | Freq: Three times a day (TID) | SUBCUTANEOUS | Status: DC
  Administered 2020-08-10: 12:00:00 6 [IU] via SUBCUTANEOUS
  Administered 2020-08-10: 16:00:00 2 [IU] via SUBCUTANEOUS
  Administered 2020-08-10: 06:00:00 9 [IU] via SUBCUTANEOUS
  Administered 2020-08-11 – 2020-08-12 (×3): 2 [IU] via SUBCUTANEOUS
  Administered 2020-08-12: 17:00:00 3 [IU] via SUBCUTANEOUS
  Administered 2020-08-13 (×2): 1 [IU] via SUBCUTANEOUS
  Administered 2020-08-13 – 2020-08-15 (×3): 2 [IU] via SUBCUTANEOUS

## 2020-08-09 MED ORDER — GABAPENTIN 100 MG PO CAPS
100.0000 mg | ORAL_CAPSULE | Freq: Every day | ORAL | Status: DC
  Administered 2020-08-09 – 2020-08-13 (×5): 100 mg via ORAL
  Filled 2020-08-09 (×5): qty 1

## 2020-08-09 MED ORDER — SODIUM CHLORIDE 0.9 % IV SOLN: 250.0000 mL | INTRAVENOUS | Status: DC | PRN

## 2020-08-09 MED ORDER — ROSUVASTATIN CALCIUM 20 MG PO TABS
20.0000 mg | ORAL_TABLET | Freq: Every day | ORAL | Status: DC
  Administered 2020-08-09 – 2020-08-10 (×2): 20 mg via ORAL
  Filled 2020-08-09 (×2): qty 1

## 2020-08-09 MED ORDER — FUROSEMIDE 10 MG/ML IJ SOLN
40.0000 mg | Freq: Once | INTRAMUSCULAR | Status: AC
  Administered 2020-08-09: 18:00:00 40 mg via INTRAVENOUS
  Filled 2020-08-09: qty 4

## 2020-08-09 MED ORDER — OXYCODONE-ACETAMINOPHEN 5-325 MG PO TABS
1.0000 | ORAL_TABLET | Freq: Three times a day (TID) | ORAL | Status: DC | PRN
  Administered 2020-08-13: 2 via ORAL
  Filled 2020-08-09: qty 2

## 2020-08-09 MED ORDER — OXYCODONE-ACETAMINOPHEN 5-325 MG PO TABS
1.0000 | ORAL_TABLET | Freq: Once | ORAL | Status: AC
  Administered 2020-08-09: 12:00:00 1 via ORAL
  Filled 2020-08-09: qty 1

## 2020-08-09 MED ORDER — POLYETHYLENE GLYCOL 3350 17 GM/SCOOP PO POWD
17.0000 g | Freq: Every day | ORAL | Status: DC | PRN
  Filled 2020-08-09: qty 255

## 2020-08-09 NOTE — H&P (Signed)
History and Physical    Kaylee Mullins [id number] DOB: December 07, 1935 DOA: 08/09/2020  I have briefly reviewed the patient's prior medical records in [place]  PCP: Nolene Ebbs, MD  Patient coming from: Home  Chief Complaint: Shortness of breath  HPI: Kaylee Mullins is a 84 y.o. 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 Patient tells me that she had chemotherapy a week ago last Friday, and ever since she has not been feeling well. She has been complaining of generalized weakness as well as progressive shortness of breath. Over the same period of time she also noticed that her legs have become more swollen. She denies any fever or chills. She denies any chest pains. No abdominal pain, no nausea or vomiting. She found herself to be able to walk less and less distances before getting out of breath.  ED Course: In the ED she has low-grade temp 99.3, tachypneic, normotensive. She was initially hypoxic requiring high percent FiO2 and BiPAP but she was rapidly weaned off to room air without any interventions. Her blood work shows a BUN of 30 and a creatinine of 1.2, BNP is elevated 391 and high-sensitivity troponin was 124 initially and trending down to 93. CBC shows mild anemia. Due to elevation in troponin cardiology was consulted, recommending gentle diuresis and overnight admission  Review of Systems: All systems reviewed, and apart from HPI, all negative  Past Medical History:  Diagnosis Date  . Acute CVA (cerebrovascular accident) ([place]) 09/16/2014  . Acute DVT of left tibial vein ([place]) 04/03/2017  . Adenocarcinoma ([place]) 01/12/2017  . 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 Anxiety   . Arthritis    knees  . Black  tarry stools    05-14-16 negative for occult blood with ER visit- noted in [place].  . Cancer [facility])    thyroid cancer- surgery and radiation  . Cerebral infarction due to unspecified mechanism   . Chronic kidney disease    questionable mass on kidney. Being followed by Dr Diona Fanti  . Complication of anesthesia    heart rate was really low  . CVA (cerebral infarction) 09/11/2014  . Depression   . Diabetes mellitus    type 2  . Diabetes mellitus without complication ([place]) 123XX123   Qualifier: Diagnosis of  By: Loanne Drilling MD, Jacelyn Pi   . Dyslipidemia 04/20/2007   Qualifier: Diagnosis of  By: Loanne Drilling MD, Jacelyn Pi   . Full dentures   . Gastroparesis 02/06/2017  . GERD (gastroesophageal reflux disease) 04/03/2017  . History of CVA (cerebral vascular accident) ([place]) 09/11/2014  . Hypertension   . Hypokalemia 08/25/2017  . Hypothyroidism   . Lumbar stenosis with neurogenic claudication 02/10/2016  . Osteoarthritis 09/11/2014  . Pneumonia   . Right renal mass 09/13/2014  . Spinal stenosis   . Stroke ([place]) 09/2014   left sided weakness  . SVT (supraventricular tachycardia) ([place]) 07/30/2017    Past Surgical History:  Procedure Laterality Date  . ABDOMINAL HYSTERECTOMY     partial  . cataracts     Removed  11/2015  bilateral  . COLONOSCOPY W/ POLYPECTOMY    . ERCP N/A 05/07/2016   Procedure: ENDOSCOPIC RETROGRADE CHOLANGIOPANCREATOGRAPHY (ERCP);  Surgeon: Clarene Essex, MD;  Location: [facility] ENDOSCOPY;  Service: Endoscopy;  Laterality: N/A;  . ERCP N/A 10/16/2016  Procedure: ENDOSCOPIC RETROGRADE CHOLANGIOPANCREATOGRAPHY (ERCP);  Surgeon: Clarene Essex, MD;  Location: [facility] ENDOSCOPY;  Service: Endoscopy;  Laterality: N/A;  . ESOPHAGOGASTRODUODENOSCOPY N/A 02/10/2017   Procedure: ESOPHAGOGASTRODUODENOSCOPY (EGD);  Surgeon: Teena Irani, MD;  Location: [facility] ENDOSCOPY;  Service: Endoscopy;  Laterality: N/A;  . EUS N/A 05/27/2016   Procedure: ESOPHAGEAL ENDOSCOPIC ULTRASOUND (EUS) RADIAL;  Surgeon: Arta Silence, MD;   Location: [facility] ENDOSCOPY;  Service: Endoscopy;  Laterality: N/A;  . FLEXIBLE SIGMOIDOSCOPY  03/29/2012   Procedure: FLEXIBLE SIGMOIDOSCOPY;  Surgeon: Jeryl Columbia, MD;  Location: [facility] ENDOSCOPY;  Service: Endoscopy;  Laterality: N/A;  fleet enema upon arrival  . HOT HEMOSTASIS  03/29/2012   Procedure: HOT HEMOSTASIS (ARGON PLASMA COAGULATION/BICAP);  Surgeon: Jeryl Columbia, MD;  Location: [facility] ENDOSCOPY;  Service: Endoscopy;  Laterality: N/A;  . IR GASTR TUBE CONVERT GASTR-JEJ PER W/FL MOD SED  02/23/2017  . IR GASTROSTOMY TUBE MOD SED  02/16/2017  . IR GENERIC HISTORICAL  06/30/2016   IR RADIOLOGIST EVAL & MGMT 06/30/2016 Aletta Edouard, MD [facility] INTERV RAD  . IR GENERIC HISTORICAL  09/09/2016   IR RADIOLOGIST EVAL & MGMT 09/09/2016 Aletta Edouard, MD [facility] INTERV RAD  . IR IMAGING GUIDED PORT INSERTION  08/29/2019  . IR IVC FILTER PLMT / S&I /IMG GUID/MOD SED  03/03/2017  . IR PATIENT EVAL TECH 0-60 MINS  05/12/2017  . IR RADIOLOGIST EVAL & MGMT  11/24/2017  . IR RADIOLOGIST EVAL & MGMT  04/07/2018  . IR RADIOLOGIST EVAL & MGMT  05/18/2019  . LAPAROSCOPY N/A 01/12/2017   Procedure: LAPAROSCOPY DIAGNOSTIC;  Surgeon: Stark Klein, MD;  Location: [place];  Service: General;  Laterality: N/A;  . LAPAROTOMY N/A 03/10/2017   Procedure: EXPLORATORY LAPAROTOMY Open jejunostomy tube;  Surgeon: Stark Klein, MD;  Location: [place];  Service: General;  Laterality: N/A;  . LUMBAR LAMINECTOMY/DECOMPRESSION MICRODISCECTOMY N/A 02/10/2016   Procedure: Lumbar three- four Laminectomy;  Surgeon: Kristeen Miss, MD;  Location: [facility] NEURO ORS;  Service: Neurosurgery;  Laterality: N/A;  L3-4 Laminectomy  . LUMBAR SPINE SURGERY     1st surgery "ray cage placed"  . THYROIDECTOMY  2005  . TONSILLECTOMY    . WHIPPLE PROCEDURE N/A 01/12/2017   Procedure: WHIPPLE PROCEDURE;  Surgeon: Stark Klein, MD;  Location: [place];  Service: General;  Laterality: N/A;     reports that she has never smoked. She has never used smokeless tobacco. She  reports current drug use. Drug: Methamphetamines. She reports that she does not drink alcohol.  Allergies  Allergen Reactions  . Hydralazine Hcl Shortness Of Breath    Pt had severe respiratory distress after receiving a dose  . Amlodipine Besylate Swelling  . Darvon [Propoxyphene Hcl] Other (See Comments)    Hallucinations   . Nyquil Multi-Symptom [Pseudoeph-Doxylamine-Dm-Apap] Other (See Comments)    Makes pt not "feel right in her head"  . Metformin And Related Diarrhea    Family History  Problem Relation Age of Onset  . Heart failure Mother   . Heart attack Father     Prior to Admission medications   Medication Sig Start Date End Date Taking? Authorizing Provider  acetaminophen (TYLENOL) 500 MG tablet Take 500 mg by mouth every 6 (six) hours as needed for mild pain.   Yes [provider]  carvedilol (COREG) 25 MG tablet Take 1 tablet (25 mg total) by mouth 2 (two) times daily. 07/30/17  Yes Turner, Eber Hong, MD  furosemide (LASIX) 80 MG tablet TAKE 1 TABLET BY MOUTH EVERY DAY ALTERNATING  WITH 40MG  06/20/20  Yes Fay Records, MD  gabapentin (NEURONTIN) 100 MG capsule Take 100 mg by mouth daily. 06/25/20  Yes [provider]  HUMALOG KWIKPEN 100 UNIT/ML KwikPen Inject 7-10 Units into the skin See admin instructions. Three times daily per sliding scale 06/30/18  Yes [provider]  insulin detemir (LEVEMIR) 100 UNIT/ML injection Inject 0.07 mLs (7 Units total) into the skin 2 (two) times daily. Patient taking differently: Inject 40 Units into the skin daily. 08/06/17  Yes Patrecia Pour, Christean Grief, MD  levothyroxine (SYNTHROID, LEVOTHROID) 200 MCG tablet Take 200 mcg by mouth daily before breakfast. For hypothyroidism 11/11/15  Yes [provider]  lidocaine-prilocaine (EMLA) cream Apply to port site 1-2 hours prior to use 08/22/19  Yes Owens Shark, NP  NIFEdipine (PROCARDIA-XL/ADALAT-CC/NIFEDICAL-XL) 30 MG 24 hr tablet Take 30 mg by mouth daily. 07/04/17   Yes [provider]  ondansetron (ZOFRAN) 8 MG tablet Take 1 tablet (8 mg total) by mouth every 8 (eight) hours as needed for nausea or vomiting. 08/03/19  Yes Owens Shark, NP  oxyCODONE-acetaminophen (PERCOCET/ROXICET) 5-325 MG tablet Take 1-2 tablets by mouth every 8 (eight) hours as needed for severe pain. 01/05/20  Yes Ladell Pier, MD  pantoprazole (PROTONIX) 40 MG tablet Take 1 tablet (40 mg total) by mouth at bedtime. 02/04/17  Yes Stark Klein, MD  Polyethylene Glycol 3350 (MIRALAX PO) Take 17 g by mouth daily as needed (constipation).   Yes [provider]  potassium chloride SA (KLOR-CON M20) 20 MEQ tablet Take 3 tablets daily alternating with 1 tablet daily.  Take with furosemide. 11/22/17  Yes Fay Records, MD  rosuvastatin (CRESTOR) 20 MG tablet Take 20 mg by mouth daily.  07/27/19  Yes [provider]  Vitamin D, Ergocalciferol, (DRISDOL) 1.25 MG (50000 UNIT) CAPS capsule TAKE 1 CAPSULE BY MOUTH ONE TIME PER WEEK Patient taking differently: Take 50,000 Units by mouth every Monday. 06/11/20  Yes Ladell Pier, MD  warfarin (COUMADIN) 7.5 MG tablet Take 1 tablet (7.5 mg total) by mouth one time only at 6 PM. 08/06/17  Yes Patrecia Pour, Christean Grief, MD    Physical Exam: Vitals:   08/09/20 1645 08/09/20 1700 08/09/20 1730 08/09/20 1737  BP: 120/60 118/60 116/68 116/68  Pulse:    77  Resp: 17 20 17  (!) 22  Temp:      TempSrc:      SpO2:    94%  Weight:      Height:        Constitutional: No significant distress, on room air but tachypneic when she is talking to me Eyes: PERRL, lids and conjunctivae no Neck: normal, supple Respiratory: clear to auscultation bilaterally, no wheezing, no crackles. Normal respiratory effort.  Cardiovascular: Regular rate and rhythm, no murmurs / rubs / gallops. 1+ edema bilateral lower extremities Abdomen: no tenderness, no masses palpated. Bowel sounds positive.  Musculoskeletal: no clubbing / cyanosis. Normal muscle  tone.  Skin: no rashes Neurologic: CN 2-12 grossly intact. Strength 5/5 in all 4.  Psychiatric: Normal judgment and insight. Alert and oriented x 3. Normal mood.   Labs on Admission: I have personally reviewed following labs and imaging studies  CBC: Recent Labs  Lab 08/09/20 0700  WBC 9.9  NEUTROABS 8.3*  HGB 9.3*  HCT 28.5*  MCV 95.3  PLT 0000000   Basic Metabolic Panel: Recent Labs  Lab 08/09/20 0700  NA 137  K 3.8  CL 100  CO2 25  GLUCOSE  400*  BUN 30*  CREATININE 1.22*  CALCIUM 7.9*   Liver Function Tests: Recent Labs  Lab 08/09/20 0900  AST 61*  ALT 25  ALKPHOS 125  BILITOT 0.8  PROT 6.1*  ALBUMIN 1.9*   Coagulation Profile: Recent Labs  Lab 08/09/20 0900  INR 3.7*   BNP (last 3 results) No results for input(s): PROBNP in the last 8760 hours. CBG: No results for input(s): GLUCAP in the last 168 hours. Thyroid Function Tests: No results for input(s): TSH, T4TOTAL, FREET4, T3FREE, THYROIDAB in the last 72 hours. Urine analysis:    Component Value Date/Time   COLORURINE AMBER (A) 11/24/2019 1127   APPEARANCEUR CLOUDY (A) 11/24/2019 1127   LABSPEC 1.024 11/24/2019 1127   PHURINE 5.0 11/24/2019 1127   GLUCOSEU NEGATIVE 11/24/2019 1127   HGBUR SMALL (A) 11/24/2019 1127   BILIRUBINUR NEGATIVE 11/24/2019 1127   KETONESUR NEGATIVE 11/24/2019 1127   PROTEINUR 100 (A) 11/24/2019 1127   UROBILINOGEN 0.2 09/16/2014 1317   NITRITE NEGATIVE 11/24/2019 1127   LEUKOCYTESUR LARGE (A) 11/24/2019 1127     Radiological Exams on Admission: DG Chest Portable 1 View  Result Date: 08/09/2020 CLINICAL DATA:  Shortness of breath for 1 day EXAM: PORTABLE CHEST 1 VIEW COMPARISON:  04/30/2020 FINDINGS: Cardiomegaly. Prominent markings at the bases correlating with pleural opacity on the right and scarring on the left by chest CT 04/30/2020. No acute airspace disease, edema, effusion, or pneumothorax. Porta catheter on the right tip at the upper cavoatrial junction.  IMPRESSION: No evidence of acute disease. Electronically Signed   By: Monte Fantasia M.D.   On: 08/09/2020 07:09   ECHOCARDIOGRAM COMPLETE  Result Date: 08/09/2020    ECHOCARDIOGRAM REPORT   Patient Name:   Kaylee Mullins Date of Exam: 08/09/2020 Medical Rec #:  [id number]       Height:       64.0 in Accession #:    [id number]      Weight:       186.0 lb Date of Birth:  08/17/1936      BSA:          1.897 m Patient Age:    41 years        BP:           109/54 mmHg Patient Gender: F               HR:           74 bpm. Exam Location:  Inpatient Procedure: 2D Echo, Cardiac Doppler and Color Doppler Indications:    Dyspnea  History:        Patient has prior history of Echocardiogram examinations, most                 recent 06/28/2017. Stroke, Arrythmias:SVT,                 Signs/Symptoms:Dyspnea; Risk Factors:Diabetes and Hypertension.                 Elevated Trponin, chemo.  Sonographer:    Dustin Flock Referring Phys: [id number] [place]  1. Mild septal hypokinesis. Left ventricular ejection fraction, by estimation, is 50 to 55%. The left ventricle has low normal function. The left ventricle has no regional wall motion abnormalities. Left ventricular diastolic parameters are consistent with Grade II diastolic dysfunction (pseudonormalization). Elevated left ventricular end-diastolic pressure.  2. Right ventricular systolic function is normal. The right ventricular size is normal.  3. Left atrial size was mildly  dilated.  4. The mitral valve is normal in structure. Trivial mitral valve regurgitation. No evidence of mitral stenosis.  5. The aortic valve is calcified. There is mild calcification of the aortic valve. There is mild thickening of the aortic valve. Aortic valve regurgitation is mild. No aortic stenosis is present.  6. The inferior vena cava is normal in size with greater than 50% respiratory variability, suggesting right atrial pressure of 3 mmHg. FINDINGS  Left Ventricle:  Mild septal hypokinesis. Left ventricular ejection fraction, by estimation, is 50 to 55%. The left ventricle has low normal function. The left ventricle has no regional wall motion abnormalities. The left ventricular internal cavity size  was normal in size. There is no left ventricular hypertrophy. Left ventricular diastolic parameters are consistent with Grade II diastolic dysfunction (pseudonormalization). Elevated left ventricular end-diastolic pressure. Right Ventricle: The right ventricular size is normal. No increase in right ventricular wall thickness. Right ventricular systolic function is normal. Left Atrium: Left atrial size was mildly dilated. Right Atrium: Right atrial size was normal in size. Pericardium: Trivial pericardial effusion is present. Mitral Valve: The mitral valve is normal in structure. There is moderate thickening of the mitral valve leaflet(s). Trivial mitral valve regurgitation. No evidence of mitral valve stenosis. Tricuspid Valve: The tricuspid valve is normal in structure. Tricuspid valve regurgitation is trivial. No evidence of tricuspid stenosis. Aortic Valve: The aortic valve is calcified. There is mild calcification of the aortic valve. There is mild thickening of the aortic valve. Aortic valve regurgitation is mild. Aortic regurgitation PHT measures 397 msec. No aortic stenosis is present. Pulmonic Valve: The pulmonic valve was normal in structure. Pulmonic valve regurgitation is not visualized. No evidence of pulmonic stenosis. Aorta: The aortic root is normal in size and structure. Venous: The inferior vena cava is normal in size with greater than 50% respiratory variability, suggesting right atrial pressure of 3 mmHg. IAS/Shunts: No atrial level shunt detected by color flow Doppler.  LEFT VENTRICLE PLAX 2D LVIDd:         4.50 cm  Diastology LVIDs:         3.20 cm  LV e' medial:    4.79 cm/s LV PW:         1.50 cm  LV E/e' medial:  17.1 LV IVS:        1.20 cm  LV e' lateral:    7.18 cm/s LVOT diam:     2.30 cm  LV E/e' lateral: 11.4 LV SV:         57 LV SV Index:   30 LVOT Area:     4.15 cm  RIGHT VENTRICLE RV Basal diam:  2.40 cm RV S prime:     8.27 cm/s TAPSE (M-mode): 2.0 cm LEFT ATRIUM             Index       RIGHT ATRIUM           Index LA diam:        5.20 cm 2.74 cm/m  RA Area:     12.70 cm LA Vol (A2C):   60.4 ml 31.84 ml/m RA Volume:   24.20 ml  12.76 ml/m LA Vol (A4C):   58.4 ml 30.79 ml/m LA Biplane Vol: 59.9 ml 31.58 ml/m  AORTIC VALVE LVOT Vmax:   69.80 cm/s LVOT Vmean:  45.900 cm/s LVOT VTI:    0.137 m AI PHT:      397 msec  AORTA Ao Root diam: 2.90 cm MITRAL VALVE  MV Area (PHT): 3.31 cm    SHUNTS MV Decel Time: 229 msec    Systemic VTI:  0.14 m MV E velocity: 81.80 cm/s  Systemic Diam: 2.30 cm MV A velocity: 78.00 cm/s MV E/A ratio:  1.05 Skeet Latch MD Electronically signed by Skeet Latch MD Signature Date/Time: 08/09/2020/2:47:19 PM    Final     EKG: Independently reviewed. Sinus rhythm, sinus tachycardia  Assessment/Plan  Principal Problem Acute hypoxic respiratory failure -Initially requiring BiPAP, quickly resolved without any intervention. Not to represent clear on the etiology, but possibly due to acute on chronic diastolic CHF. Chest x-ray was clear which may have a degree of very mild fluid overload given elevated BNP. A 2D echo was done today after cardiology evaluation showed EF [id number], grade 2 diastolic dysfunction -Discussed over the phone with Dr. Harl Bowie who saw her earlier today, will gentle diuresis with Lasix x1 and reassess clinical response and monitor renal function in the morning  Active Problems CKD 3 A -Baseline creatinine around 1.0, currently not far off baseline. Monitor  Ampullary cancer -Currently undergoing chemotherapy as an outpatient. History of [place]  Anemia of chronic disease -Hemoglobin stable, no bleeding  History of thyroid cancer -On thyroid supplementations, check TSH given worsening lower  extremity swelling. Her TSH was last checked in 2019 and it was 36.3  Type 2 diabetes mellitus -Continue long-acting insulin, placed on sliding scale, carb modified diet  Essential hypertension -Blood pressure normal in the ED, restart home Coreg but hold oral furosemide as she will get IV, also hold nifedipine to allow diuresis  Hyperlipidemia -Continue statin  History of DVT -INR supratherapeutic, start Coumadin per pharmacy. Of note, she has a history of rectus sheath hematoma in 2018 when she was treated with Lovenox, and had an IVC filter placed at that time   Elevated troponin -Possibly demand, trending down. No chest pain  Aortic atherosclerosis, vascular disease, coronary calcifications on CT scan -Noted  DVT prophylaxis: on Coumadin   Code Status: Full code per patient  Family Communication: no family in the room  Disposition Plan: home when ready  Bed Type: telemetry Consults called: cardiology   Obs/Inp: Observation   Marzetta Board, MD, PhD [facility]  Contact via [email or web address]  08/09/2020, 5:46 PM

## 2020-08-09 NOTE — Progress Notes (Signed)
Pt removed from BIPAP and placed on 3L [place]. Pts WOB WNL. PA at bedside.

## 2020-08-09 NOTE — Consult Note (Addendum)
Cardiology Consultation:   Patient ID: Kaylee Mullins; [id number]; [birth year]   Admit date: 08/09/2020 Date of Consult: 08/09/2020  Primary Care Provider: Nolene Ebbs, MD Primary Cardiologist: Dorris Carnes, MD 04/29/2018 Primary Electrophysiologist:  None   Patient Profile:   Kaylee Mullins is a 84 y.o. female with a hx of SVT,  DM, HTN, CVA, DVT (Rx with lovenox Summer [id number]; complicated by rectus sheath hematoma; IVC filter placed), CT 2018 w/ hypodense thrombus in the IVC below the filter extending into the iliac veins (subacute to chronic), Whipple for ampullary Ca, renal CA, thyroid CA, CKD III,  who is being seen today for the evaluation of elevated troponin and SOB at the request of Dr Ronnald Nian.  History of Present Illness:   Kaylee Mullins was in her usual state of health until her chemotherapy appointment on 12/17.  It is normal for her to become weak and have side effects from the chemotherapy starting about 2 days later.  She developed shortness of breath with exertion.  She had skipped some of her Lasix doses.  She contacted the Oncology office and they advised her to take 80 mg of Lasix with potassium and resume her home Lasix dosing.  However, she states that did not work.  Her legs are more swollen than usual, and because of the swelling, it is harder for her to move them.  She has chronic left-sided weakness from a previous CVA.  She was too short of breath to do anything and called EMS today.  EMS got O2 saturations in the high 80s on room air and started on BiPAP.  By arrival at the emergency room, she had improved to the point that she was changed to a nasal cannula.  A little while later, she was taken off oxygen completely and is currently satting 94% on room air.  However, her respiratory rate is elevated at 26.  She still feels short of breath, but is improved.  She has not had any chest pain or palpitations.  No presyncope or syncope.  Her last chemotherapy  treatment, she thought she was doing pretty well although she could not walk.  She lives alone, with her family helping to care for her.  She has not been able to walk in several weeks.  She states that her family buys the groceries and she selects low-sodium options. She is not able to get herself out of bed and into a wheelchair unassisted.   Past Medical History:  Diagnosis Date  . Acute CVA (cerebrovascular accident) ([place]) 09/16/2014  . Acute DVT of left tibial vein ([place]) 04/03/2017  . Adenocarcinoma ([place]) 01/12/2017  . 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 Anxiety   . Arthritis    knees  . Black tarry stools    05-14-16 negative for occult blood with ER visit- noted in [place].  . Cancer [facility])    thyroid cancer- surgery and radiation  . Cerebral infarction due to unspecified mechanism   . Chronic kidney disease    questionable mass on kidney. Being followed by Dr Diona Fanti  . Complication of anesthesia    heart rate was really low  . CVA (cerebral infarction) 09/11/2014  . Depression   . Diabetes mellitus    type 2  . Diabetes mellitus without complication ([place]) [id number]   Qualifier: Diagnosis of  By: Loanne Drilling MD, Jacelyn Pi   . Dyslipidemia 04/20/2007   Qualifier: Diagnosis of  By: Loanne Drilling MD, Jacelyn Pi   . Full dentures   .  Gastroparesis 02/06/2017  . GERD (gastroesophageal reflux disease) 04/03/2017  . History of CVA (cerebral vascular accident) ([place]) 09/11/2014  . Hypertension   . Hypokalemia 08/25/2017  . Hypothyroidism   . Lumbar stenosis with neurogenic claudication 02/10/2016  . Osteoarthritis 09/11/2014  . Pneumonia   . Right renal mass 09/13/2014  . Spinal stenosis   . Stroke ([place]) 09/2014   left sided weakness  . SVT (supraventricular tachycardia) ([place]) 07/30/2017    Past Surgical History:  Procedure Laterality Date  . ABDOMINAL HYSTERECTOMY     partial  . cataracts     Removed  11/2015  bilateral  . COLONOSCOPY W/ POLYPECTOMY    . ERCP N/A 05/07/2016   Procedure:  ENDOSCOPIC RETROGRADE CHOLANGIOPANCREATOGRAPHY (ERCP);  Surgeon: Clarene Essex, MD;  Location: [facility] ENDOSCOPY;  Service: Endoscopy;  Laterality: N/A;  . ERCP N/A 10/16/2016   Procedure: ENDOSCOPIC RETROGRADE CHOLANGIOPANCREATOGRAPHY (ERCP);  Surgeon: Clarene Essex, MD;  Location: [facility] ENDOSCOPY;  Service: Endoscopy;  Laterality: N/A;  . ESOPHAGOGASTRODUODENOSCOPY N/A 02/10/2017   Procedure: ESOPHAGOGASTRODUODENOSCOPY (EGD);  Surgeon: Teena Irani, MD;  Location: [facility] ENDOSCOPY;  Service: Endoscopy;  Laterality: N/A;  . EUS N/A 05/27/2016   Procedure: ESOPHAGEAL ENDOSCOPIC ULTRASOUND (EUS) RADIAL;  Surgeon: Arta Silence, MD;  Location: [facility] ENDOSCOPY;  Service: Endoscopy;  Laterality: N/A;  . FLEXIBLE SIGMOIDOSCOPY  03/29/2012   Procedure: FLEXIBLE SIGMOIDOSCOPY;  Surgeon: Jeryl Columbia, MD;  Location: [facility] ENDOSCOPY;  Service: Endoscopy;  Laterality: N/A;  fleet enema upon arrival  . HOT HEMOSTASIS  03/29/2012   Procedure: HOT HEMOSTASIS (ARGON PLASMA COAGULATION/BICAP);  Surgeon: Jeryl Columbia, MD;  Location: [facility] ENDOSCOPY;  Service: Endoscopy;  Laterality: N/A;  . IR GASTR TUBE CONVERT GASTR-JEJ PER W/FL MOD SED  02/23/2017  . IR GASTROSTOMY TUBE MOD SED  02/16/2017  . IR GENERIC HISTORICAL  06/30/2016   IR RADIOLOGIST EVAL & MGMT 06/30/2016 Aletta Edouard, MD [facility] INTERV RAD  . IR GENERIC HISTORICAL  09/09/2016   IR RADIOLOGIST EVAL & MGMT 09/09/2016 Aletta Edouard, MD [facility] INTERV RAD  . IR IMAGING GUIDED PORT INSERTION  08/29/2019  . IR IVC FILTER PLMT / S&I /IMG GUID/MOD SED  03/03/2017  . IR PATIENT EVAL TECH 0-60 MINS  05/12/2017  . IR RADIOLOGIST EVAL & MGMT  11/24/2017  . IR RADIOLOGIST EVAL & MGMT  04/07/2018  . IR RADIOLOGIST EVAL & MGMT  05/18/2019  . LAPAROSCOPY N/A 01/12/2017   Procedure: LAPAROSCOPY DIAGNOSTIC;  Surgeon: Stark Klein, MD;  Location: [place];  Service: General;  Laterality: N/A;  . LAPAROTOMY N/A 03/10/2017   Procedure: EXPLORATORY LAPAROTOMY Open jejunostomy tube;  Surgeon: Stark Klein,  MD;  Location: [place];  Service: General;  Laterality: N/A;  . LUMBAR LAMINECTOMY/DECOMPRESSION MICRODISCECTOMY N/A 02/10/2016   Procedure: Lumbar three- four Laminectomy;  Surgeon: Kristeen Miss, MD;  Location: [facility]O [facility]S;  Service: Neurosurgery;  Laterality: N/A;  L3-4 Laminectomy  . LUMBAR SPINE SURGERY     1st surgery "ray cage placed"  . THYROIDECTOMY  2005  . TONSILLECTOMY    . WHIPPLE PROCEDURE N/A 01/12/2017   Procedure: WHIPPLE PROCEDURE;  Surgeon: Stark Klein, MD;  Location: [facility];  Service: General;  Laterality: N/A;     Prior to Admission medications   Medication Sig Start Date End Date Taking? Authorizing Provider  acetaminophen (TYLENOL) 500 MG tablet Take 500 mg by mouth every 6 (six) hours as needed for mild pain.   Yes [provider]  carvedilol (COREG) 25 MG tablet Take 1 tablet (25 mg total)  by mouth 2 (two) times daily. 07/30/17  Yes Turner, Eber Hong, MD  furosemide (LASIX) 80 MG tablet TAKE 1 TABLET BY MOUTH EVERY DAY ALTERNATING WITH 40MG  06/20/20  Yes Fay Records, MD  gabapentin (NEURONTIN) 100 MG capsule Take 100 mg by mouth daily. 06/25/20  Yes [provider]  HUMALOG KWIKPEN 100 UNIT/ML KwikPen Inject 7-10 Units into the skin See admin instructions. Three times daily per sliding scale 06/30/18  Yes [provider]  insulin detemir (LEVEMIR) 100 UNIT/ML injection Inject 0.07 mLs (7 Units total) into the skin 2 (two) times daily. Patient taking differently: Inject 40 Units into the skin daily. 08/06/17  Yes Patrecia Pour, Christean Grief, MD  levothyroxine (SYNTHROID, LEVOTHROID) 200 MCG tablet Take 200 mcg by mouth daily before breakfast. For hypothyroidism 11/11/15  Yes [provider]  lidocaine-prilocaine (EMLA) cream Apply to port site 1-2 hours prior to use 08/22/19  Yes Owens Shark, NP  NIFEdipine (PROCARDIA-XL/ADALAT-CC/NIFEDICAL-XL) 30 MG 24 hr tablet Take 30 mg by mouth daily. 07/04/17  Yes [provider]  ondansetron  (ZOFRAN) 8 MG tablet Take 1 tablet (8 mg total) by mouth every 8 (eight) hours as needed for nausea or vomiting. 08/03/19  Yes Owens Shark, NP  oxyCODONE-acetaminophen (PERCOCET/ROXICET) 5-325 MG tablet Take 1-2 tablets by mouth every 8 (eight) hours as needed for severe pain. 01/05/20  Yes Ladell Pier, MD  pantoprazole (PROTONIX) 40 MG tablet Take 1 tablet (40 mg total) by mouth at bedtime. 02/04/17  Yes Stark Klein, MD  Polyethylene Glycol 3350 (MIRALAX PO) Take 17 g by mouth daily as needed (constipation).   Yes [provider]  potassium chloride SA (KLOR-CON M20) 20 MEQ tablet Take 3 tablets daily alternating with 1 tablet daily.  Take with furosemide. 11/22/17  Yes Fay Records, MD  rosuvastatin (CRESTOR) 20 MG tablet Take 20 mg by mouth daily.  07/27/19  Yes [provider]  Vitamin D, Ergocalciferol, (DRISDOL) 1.25 MG (50000 UNIT) CAPS capsule TAKE 1 CAPSULE BY MOUTH ONE TIME PER WEEK Patient taking differently: Take 50,000 Units by mouth every Monday. 06/11/20  Yes Ladell Pier, MD  warfarin (COUMADIN) 7.5 MG tablet Take 1 tablet (7.5 mg total) by mouth one time only at 6 PM. 08/06/17  Yes Patrecia Pour, Christean Grief, MD    Inpatient Medications: Scheduled Meds:  Continuous Infusions:  PRN Meds:   Allergies:    Allergies  Allergen Reactions  . Hydralazine Hcl Shortness Of Breath    Pt had severe respiratory distress after receiving a dose  . Amlodipine Besylate Swelling  . Darvon [Propoxyphene Hcl] Other (See Comments)    Hallucinations   . Nyquil Multi-Symptom [Pseudoeph-Doxylamine-Dm-Apap] Other (See Comments)    Makes pt not "feel right in her head"  . Metformin And Related Diarrhea    Social History:   Social History   Socioeconomic History  . Marital status: Widowed    Spouse name: Not on file  . Number of children: Not on file  . Years of education: Not on file  . Highest education level: Not on file  Occupational History  . Occupation:  housewife    Comment: college housekeeping  Tobacco Use  . Smoking status: Never Smoker  . Smokeless tobacco: Never Used  Vaping Use  . Vaping Use: Never used  Substance and Sexual Activity  . Alcohol use: No  . Drug use: Yes    Types: Methamphetamines  . Sexual activity: Never  Other Topics Concern  . Not  on file  Social History Narrative   Widowed,  retired, 3 children living, 2 children deceased   Caffeine use - soda every few days   Right handed   Pt lives alone.  Using cane when out and about.    Admitted to [facility] 03/29/17   Never smoked   Alcohol none    Full Code   Social Determinants of Health   Financial Resource Strain: Not on file  Food Insecurity: Not on file  Transportation Needs: Not on file  Physical Activity: Not on file  Stress: Not on file  Social Connections: Not on file  Intimate Partner Violence: Not on file    Family History:   Family History  Problem Relation Age of Onset  . Heart failure Mother   . Heart attack Father    Family Status:  Family Status  Relation Name Status  . Mother  Deceased  . Father  Deceased  . Daughter [employer]  . Son [employer]  . [employer]  Deceased  . MGF  Deceased  . PGM  Deceased  . PGF  Deceased    ROS:  Please see the history of present illness.  All other ROS reviewed and negative.     Physical Exam/Data:   Vitals:   08/09/20 1030 08/09/20 1115 08/09/20 1145 08/09/20 1200  BP: (!) 113/54 117/70 112/69 121/72  Pulse: (!) 115 75 (!) 114 (!) 116  Resp: (!) 21 (!) 24 (!) 23 (!) 24  Temp:      TempSrc:      SpO2: 94% 95% 94% 95%  Weight:      Height:       No intake or output data in the 24 hours ending 08/09/20 1241  Last 3 Weights 08/09/2020 08/02/2020 07/18/2020  Weight (lbs) 186 lb 187 lb 8 oz 188 lb 8 oz  Weight (kg) 84.369 kg 85.049 kg 85.503 kg     Body mass index is 31.93 kg/m.   General:  Well nourished, well developed, female in no acute distress HEENT:  normal Lymph: no adenopathy Neck: JVD -9-10 cm Endocrine:  No thryomegaly Vascular: No carotid bruits; upper extremity pulses 2+  Cardiac:  normal S1, S2; RRR; soft murmur Lungs: Rales bases bilaterally, no wheezing, rhonchi  Abd: soft, diffusely tender, no hepatomegaly  Ext: 1+ lower extremity edema, therefore pedal pulses difficult to palpate Musculoskeletal:  No deformities, chronic left-sided weakness, generalized weakness otherwise Skin: warm and dry  Neuro:  CNs 2-12 intact, no focal abnormalities noted Psych:  Normal affect   EKG:  The EKG was personally reviewed and demonstrates: 12/24 ECG is sinus rhythm, heart rate 74, no acute ischemic changes Telemetry:  Telemetry was personally reviewed and demonstrates: Sinus rhythm   CV studies:   ECHO: Ordered  ECHO: 06/28/2017 - Left ventricle: The cavity size was normal. There was mild  concentric hypertrophy. Systolic function was normal. The  estimated ejection fraction was in the range of 60% to 65%. Wall  motion was normal; there were no regional wall motion  abnormalities. Doppler parameters are consistent with abnormal  left ventricular relaxation (grade 1 diastolic dysfunction).  Doppler parameters are consistent with elevated ventricular  end-diastolic filling pressure.  - Aortic valve: There was mild regurgitation.  - Left atrium: The atrium was mildly dilated.  - Right ventricle: Systolic function was normal.  - Right atrium: The atrium was normal in size.  - Pulmonary arteries: Systolic pressure was within the normal  range.  -  Inferior vena cava: The vessel was normal in size.  - Pericardium, extracardiac: There was no pericardial effusion.   LE DOPPERS: 11/24/2017  Summary:  RIGHT:  - There is no evidence of deep vein thrombosis in the lower extremity.    - No cystic structure found in the popliteal fossa.    LEFT:  - There is no evidence of deep vein thrombosis in the lower extremity.     - No cystic structure found in the popliteal fossa.   LE DOPPERS: 02/02/2020 FINDINGS: RIGHT LOWER EXTREMITY  There is diffuse residual chronic wall thickening from prior DVT throughout the common femoral, profunda femoral, femoral and popliteal veins. Vessels remain compressible. No acute occlusive thrombus. Limited assessment of the calf veins.  LEFT LOWER EXTREMITY  There is similar diffuse residual chronic wall thickening from prior DVT throughout the left common femoral, profunda femoral, femoral, and popliteal veins. Again, vessels remain compressible. No acute occlusive thrombus. Limited visualization of the calf veins  IMPRESSION: Chronic changes from prior bilateral DVT with residual scattered wall thickening. Vessels remain compressible bilaterally. No significant acute recurrent occlusive DVT in either extremity.  MYOVIEW: 01/30/2016  The left ventricular ejection fraction is normal (55-65%).  Nuclear stress EF: 56%.  Findings consistent with prior myocardial infarction with peri-infarct ischemia.  This is a low risk study.   Moderate area of Inferior and inferolateral wall infarct with mild peri infarct ischemia EF 56% Note the wrong patients MPI images were scanned into the chart   Laboratory Data:   Chemistry Recent Labs  Lab 08/09/20 0700  NA 137  K 3.8  CL 100  CO2 25  GLUCOSE 400*  BUN 30*  CREATININE 1.22*  CALCIUM 7.9*  GFRNONAA 44*  ANIONGAP 12    Lab Results  Component Value Date   ALT 25 08/09/2020   AST 61 (H) 08/09/2020   ALKPHOS 125 08/09/2020   BILITOT 0.8 08/09/2020   Hematology Recent Labs  Lab 08/09/20 0700  WBC 9.9  RBC 2.99*  HGB 9.3*  HCT 28.5*  MCV 95.3  MCH 31.1  MCHC 32.6  RDW 18.2*  PLT 153   Cardiac Enzymes High Sensitivity Troponin:   Recent Labs  Lab 08/09/20 0900 08/09/20 1013  TROPONINIHS 124* 115*      BNP Recent Labs  Lab 08/09/20 0701  BNP 391.6*    DDimer No results for  input(s): DDIMER in the last 168 hours. TSH:  Lab Results  Component Value Date   TSH 36.340 (H) 04/29/2018   Lipids: Lab Results  Component Value Date   CHOL 120 04/09/2017   HDL 35 04/09/2017   LDLCALC 58 04/09/2017   TRIG 137 04/09/2017   CHOLHDL 3.0 09/13/2014   HgbA1c: Lab Results  Component Value Date   HGBA1C 9.1 (H) 04/12/2019   Magnesium:  Magnesium  Date Value Ref Range Status  09/09/2017 1.9 1.6 - 2.3 mg/dL Final   Lab Results  Component Value Date   INR 3.7 (H) 08/09/2020   INR 1.2 08/29/2019   INR 2.4 (H) 04/14/2019     Radiology/Studies:  DG Chest Portable 1 View  Result Date: 08/09/2020 CLINICAL DATA:  Shortness of breath for 1 day EXAM: PORTABLE CHEST 1 VIEW COMPARISON:  04/30/2020 FINDINGS: Cardiomegaly. Prominent markings at the bases correlating with pleural opacity on the right and scarring on the left by chest CT 04/30/2020. No acute airspace disease, edema, effusion, or pneumothorax. Porta catheter on the right tip at the upper cavoatrial junction. IMPRESSION: No evidence  of acute disease. Electronically Signed   By: Monte Fantasia M.D.   On: 08/09/2020 07:09   CT CHEST, ABDOMEN and PELVIS: 04/30/2020 CT CHEST FINDINGS Cardiovascular: Heart size is normal. There is no significant pericardial fluid, thickening or pericardial calcification. There is aortic atherosclerosis, as well as atherosclerosis of the great vessels of the mediastinum and the coronary arteries, including calcified atherosclerotic plaque in the left main, left anterior descending, left circumflex and right coronary arteries. Severe thickening calcification of the aortic valve. Right internal jugular single-lumen porta cath with tip terminating in the right atrium.  Mediastinum/Nodes: No pathologically enlarged mediastinal or hilar lymph nodes. Esophagus is unremarkable in appearance. No axillary lymphadenopathy.  Lungs/Pleura: Previously noted pleural based mass-like  lesion in the posterior aspect of the right lower lobe (axial image 39 of series 2) currently measures 3.3 x 2.1 cm, slowly increasing in size over numerous prior examinations. 4 mm enhancing pleural nodule in the posterior aspect of the right lower lobe (axial image 34 of series 2). 5 mm nodule in the superior aspect of the right middle lobe (axial image 73 of series 4), stable. Other smaller 2-3 mm pulmonary nodules are unchanged. Small right pleural effusion lying dependently. No acute consolidative airspace disease. No pleural effusions.  Musculoskeletal: Lucent lesion with vertical trabeculations in the right-side of the T10 vertebral body, stable compared to numerous prior examinations, presumably a cavernous hemangioma. No other definite lytic or blastic lesions are noted in the visualized portions of the skeleton.  CT ABDOMEN PELVIS FINDINGS  Hepatobiliary: Multiple hypovascular hepatic lesions are again noted. The largest of these are as follows. 3.0 x 2.2 cm lesion in between segments 5 and 8 (axial image 51 of series 2), previously 5.2 x 2.5 x 4.4 cm). 2.9 x 2.6 cm lesion in segment 6 (axial image 59 of series 2), previously 3.2 x 2.0 cm when measured on image 98 of series 7 of the prior examination). 2.1 x 2.2 cm lesion in segment 5 (axial image 60 of series 2), previously 2.6 x 1.8 cm. No definite new hepatic lesions are otherwise noted. No intra or extrahepatic biliary ductal dilatation. Status post cholecystectomy. Small amount of pneumobilia. Liver has a slightly nodular contour, suggesting underlying cirrhosis.  Pancreas: Postoperative changes of Whipple procedure. No discrete mass confidently identified in the remaining body and tail of the pancreas. No pancreatic ductal dilatation. No peripancreatic fluid collections or inflammatory changes.  Spleen: Unremarkable.  Adrenals/Urinary Tract: Bilateral kidneys and adrenal glands are normal in appearance. No  hydroureteronephrosis. Urinary bladder is nearly decompressed, but otherwise unremarkable in appearance.  Stomach/Bowel: Postoperative changes of Whipple procedure. Stomach is otherwise unremarkable in appearance. No pathologic dilatation of small bowel or colon. A few scattered colonic diverticulae are noted, without surrounding inflammatory changes to suggest an acute diverticulitis at this time. Normal appendix.  Vascular/Lymphatic: Aortic atherosclerosis, without evidence of aneurysm or dissection in the abdominal or pelvic vasculature. IVC filter in position with tip terminating below the level of the renal veins. Amorphous nodal mass adjacent to the left renal hilum in the left para-aortic nodal station measuring 3.2 x 1.9 cm (axial image 59 of series 2), similar to the prior study. No other lymphadenopathy confidently identified in the abdomen or pelvis.  Reproductive: Status post hysterectomy.  Ovaries are trophic.  Other: No significant volume of ascites.  No pneumoperitoneum.  Musculoskeletal: Interbody cages at the L4-L5 interspace. There are no aggressive appearing lytic or blastic lesions noted in the visualized portions of the skeleton.  IMPRESSION: 1. Today's study demonstrates a generally positive response to therapy with slight regression of multiple hepatic metastases, as detailed above. No new hepatic lesions are noted. 2. Previously noted left para-aortic lymphadenopathy is stable compared to prior examinations. 3. Slow growth of a pleural-based mass-like area in the posteromedial aspect of the right lower lobe over several prior examinations. This may simply reflect rounded atelectasis, however, close attention on follow-up imaging is recommended to ensure continued stability. 4. Aortic atherosclerosis, in addition to left main and 3 vessel coronary artery disease. 5. There are calcifications of the aortic valve. Echocardiographic correlation for  evaluation of potential valvular dysfunction may be warranted if clinically indicated. 6. Additional incidental findings, as above.  Assessment and Plan:   1. SOB, Elevated troponin -Cause is unclear. -No history of chest pain -Although BNP is somewhat elevated, chest x-ray without acute disease. -She has a history of DVT but is on Coumadin, her INR today is supratherapeutic -  Echo is ordered, follow-up on the results -She has CTs of her abdomen and pelvis ordered for January by Dr. Benay Spice, discuss with MD if we should get those now -She had plaque in all 3 coronary arteries on her last CT in September 2021, results are above -Last Myoview was 2017, abnormal, results above. -However, on Dr. Alan Ripper review, she did not see any ischemia and felt the inferior MI could just be shadowing by the diaphragm.  No further work-up recommended at that time. -MD advise if we should get a cardiac CT, or do heart catheterization on Monday  2.  Ampullary carcinoma, adenocarcinoma -Management per Dr. 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 Progressing over the last 24 hrs  In ER placed on bipap, fairly quickly transitioned off now just on RA. Did not get any lasix in ER.    Admit wt 84.4 kg, clinic  weights are very variable.  K 3.8 Cr 1.22 BUN 30 WBC 9.9 Hgb 9.3 Plt 153 BNP 391 INR 3.7 Alb 1.9  hstrop 124-->115 COVID neg CXR acute process EKG SR and sinus tach  84 yo female presents with SOB, no chest pain. Iniitlaly on bipap in ER, now transitioned to just RA. Really no clear intervention that led to her significant improvement in ER, she was not diuresed, don't seen any nebs or other treatments. CXR clear, fairly mild BNP though elevated BNP.  From notes she has chronic multifactorial LE edema, LE edema poor guide for her volume status. Can try some gentle diuresis given her elevated Cr, but its unclear her primary presentation is all secondary to HF. Mild trop in setting of hypoxia, some volume overload. Do not suspect ACS at this time, f/u echo. Advanced age with cancer would lead to high threshold to consider ischemic testing in general, would not consider at this  time.   Carlyle Dolly MD

## 2020-08-09 NOTE — Discharge Instructions (Addendum)
You will need to hold your Coumadin for today. You can begin taking this tomorrow. You will need to get your INR rechecked as early as you can which will most likely be on Monday after the holiday weekend. Follow-up with your primary care provider.

## 2020-08-09 NOTE — Progress Notes (Signed)
ANTICOAGULATION CONSULT NOTE - Initial Consult  Pharmacy Consult for warfarin Indication: VTE treatment  Allergies  Allergen Reactions  . Hydralazine Hcl Shortness Of Breath    Pt had severe respiratory distress after receiving a dose  . Amlodipine Besylate Swelling  . Darvon [Propoxyphene Hcl] Other (See Comments)    Hallucinations   . Nyquil Multi-Symptom [Pseudoeph-Doxylamine-Dm-Apap] Other (See Comments)    Makes pt not "feel right in her head"  . Metformin And Related Diarrhea    Patient Measurements: Height: 5\' 4"  (162.6 cm) Weight: 84.4 kg (186 lb) IBW/kg (Calculated) : 54.7  Vital Signs: Temp: 99.3 F (37.4 C) (12/24 0847) Temp Source: Rectal (12/24 0847) BP: 116/68 (12/24 1737) Pulse Rate: 77 (12/24 1737)  Labs: Recent Labs    08/09/20 0700 08/09/20 0900 08/09/20 1013 08/09/20 1401  HGB 9.3*  --   --   --   HCT 28.5*  --   --   --   PLT 153  --   --   --   LABPROT  --  35.6*  --   --   INR  --  3.7*  --   --   CREATININE 1.22*  --   --   --   TROPONINIHS  --  124* 115* 93*    Estimated Creatinine Clearance: 37.4 mL/min (A) (by C-G formula based on SCr of 1.22 mg/dL (H)).   Medical History: Past Medical History:  Diagnosis Date  . Acute CVA (cerebrovascular accident) ([place]) 09/16/2014  . Acute DVT of left tibial vein ([place]) 04/03/2017  . Adenocarcinoma ([place]) 01/12/2017  . 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 Anxiety   . Arthritis    knees  . Black tarry stools    05-14-16 negative for occult blood with ER visit- noted in [place].  . Cancer [facility])    thyroid cancer- surgery and radiation  . Cerebral infarction due to unspecified mechanism   . Chronic kidney disease    questionable mass on kidney. Being followed by Dr Diona Fanti  . Complication of anesthesia    heart rate was really low  . CVA (cerebral infarction) 09/11/2014  . Depression   . Diabetes mellitus    type 2  . Diabetes mellitus without complication ([place]) [id number]   Qualifier:  Diagnosis of  By: Loanne Drilling MD, Jacelyn Pi   . Dyslipidemia 04/20/2007   Qualifier: Diagnosis of  By: Loanne Drilling MD, Jacelyn Pi   . Full dentures   . Gastroparesis 02/06/2017  . GERD (gastroesophageal reflux disease) 04/03/2017  . History of CVA (cerebral vascular accident) ([place]) 09/11/2014  . Hypertension   . Hypokalemia 08/25/2017  . Hypothyroidism   . Lumbar stenosis with neurogenic claudication 02/10/2016  . Osteoarthritis 09/11/2014  . Pneumonia   . Right renal mass 09/13/2014  . Spinal stenosis   . Stroke ([place]) 09/2014   left sided weakness  . SVT (supraventricular tachycardia) ([place]) 07/30/2017    Medications:  (Not in a hospital admission)   Assessment: 5 YOF who presented with SOB. She is on warfarin at home for a h/o DVT. Of note, she has a history of rectus sheath hematoma while on Lovenox and had and IVC filter placed. PHarmacy consulted to resume home warfarin therapy  INR on admission is supratherapeutic at 3.7. H/H low, Plt wnl. Last dose of warfarin was yesterday.   Home warfarin dosage: 7.5 mg daily   Goal of Therapy:  INR 2-3 Monitor platelets by anticoagulation protocol: Yes   Plan:  -Hold warfarin today -Monitor daily  PT/INR -Monitor closely for s/s of bleeding   Albertina Parr, PharmD., BCPS, BCCCP Clinical Pharmacist Please refer to [facility] for unit-specific pharmacist

## 2020-08-09 NOTE — ED Notes (Signed)
RN attempted to call report 

## 2020-08-09 NOTE — ED Notes (Signed)
ED Provider at bedside. 

## 2020-08-09 NOTE — Progress Notes (Signed)
  Echocardiogram 2D Echocardiogram has been performed.  Kaylee Mullins 08/09/2020, 2:00 PM

## 2020-08-09 NOTE — ED Notes (Signed)
Requested percocet - given for chronic pain.

## 2020-08-09 NOTE — ED Triage Notes (Signed)
Brought in by [facility] from home, c/o SOB - short of breath x 24hrs - O2 reading high 80s with fire.   g20 left ac. bgl 458. Hx of CHF and DM. Family reports decrease in appetite.

## 2020-08-09 NOTE — ED Notes (Signed)
eating ice chips with no difficulty

## 2020-08-09 NOTE — ED Notes (Signed)
RN attempted report 

## 2020-08-09 NOTE — ED Provider Notes (Signed)
Patient seen/examined in the Emergency Department in conjunction with Advanced Practice Provider [facility] Patient reports shortness of breath and was hypoxic for EMS.  Patient placed on CPAP Exam : Awake alert, no acute distress currently on BiPAP.  Crackles noted on the right. Plan: X-ray/labs are pending at this time.  Anticipate admission.    Ripley Fraise, MD 08/09/20 [phone]

## 2020-08-09 NOTE — ED Provider Notes (Signed)
[facility] EMERGENCY DEPARTMENT Provider Note   CSN: [id number] Arrival date & time: 08/09/20  0636     History Chief Complaint  Patient presents with  . Respiratory Distress    URI Kaylee Mullins is a 84 y.o. female with a past medical history of prior stroke, prior DVT, diabetes, CHF, CKD, currently anticoagulated on Coumadin, ampullary carcinoma currently undergoing chemotherapy presenting to the ED with a chief complaint of shortness of breath.  EMS states gradually worsening shortness of breath over the past 24 hours.  Patient denies any chest pain.  States that she has lower extremity edema at baseline and this is unchanged today.  Remainder of history is limited as patient is on BiPAP.  Additional history provided by daughter and patient after BiPAP removed.  States that she received a chemotherapy treatment about 1 week ago and started having shortness of breath, decreased appetite and energy level a few days after this.  In the past 24 hours her shortness of breath has worsened and she complained that she was unable to breathe.  She reports compliance with all of her home medications, only missed 1 day of medications in the past month.  Denies any chest pain.  Reports intermittent lower abdominal pain but none currently.  No urinary symptoms.  No diarrhea or vomiting.  HPI     Past Medical History:  Diagnosis Date  . Acute CVA (cerebrovascular accident) ([place]) 09/16/2014  . Acute DVT of left tibial vein ([place]) 04/03/2017  . Adenocarcinoma ([place]) 01/12/2017  . 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 Anxiety   . Arthritis    knees  . Black tarry stools    05-14-16 negative for occult blood with ER visit- noted in [place].  . Cancer [facility])    thyroid cancer- surgery and radiation  . Cerebral infarction due to unspecified mechanism   . Chronic kidney disease    questionable mass on kidney. Being followed by Dr Diona Fanti  . Complication of anesthesia    heart rate was  really low  . CVA (cerebral infarction) 09/11/2014  . Depression   . Diabetes mellitus    type 2  . Diabetes mellitus without complication ([place]) [id number]   Qualifier: Diagnosis of  By: Loanne Drilling MD, Jacelyn Pi   . Dyslipidemia 04/20/2007   Qualifier: Diagnosis of  By: Loanne Drilling MD, Jacelyn Pi   . Full dentures   . Gastroparesis 02/06/2017  . GERD (gastroesophageal reflux disease) 04/03/2017  . History of CVA (cerebral vascular accident) ([place]) 09/11/2014  . Hypertension   . Hypokalemia 08/25/2017  . Hypothyroidism   . Lumbar stenosis with neurogenic claudication 02/10/2016  . Osteoarthritis 09/11/2014  . Pneumonia   . Right renal mass 09/13/2014  . Spinal stenosis   . Stroke ([place]) 09/2014   left sided weakness  . SVT (supraventricular tachycardia) ([place]) 07/30/2017    Patient Active Problem List   Diagnosis Date Noted  . SOB (shortness of breath) 08/09/2020  . Elevated troponin 08/09/2020  . Port-A-Cath in place 09/14/2019  . Goals of care, counseling/discussion 08/23/2019  . Traumatic hematoma of right lower leg 04/13/2019  . Cellulitis 04/12/2019  . Hypokalemia 08/25/2017  . Spinal stenosis   . Pneumonia   . Full dentures   . Depression   . Complication of anesthesia   . Chronic kidney disease   . Cancer ([place])   . Black tarry stools   . Arthritis   . Anxiety   . Hypertension 08/03/2017  . Paroxysmal SVT (supraventricular tachycardia) (  [place]) 08/03/2017  . History of CVA (cerebrovascular accident) 08/03/2017  . Hypothyroidism, adult 08/03/2017  . Chronic diastolic heart failure ([place]) 08/03/2017  . History of cancer of ampulla of duodenum 08/03/2017  . Left flank pain 08/03/2017  . Diabetes mellitus type 2, uncontrolled ([place])   . CKD stage 3 due to type 2 diabetes mellitus ([place])   . SVT (supraventricular tachycardia) ([place]) 07/30/2017  . Edema extremities 07/30/2017  . DVT, lower extremity, recurrent, bilateral ([place]) 07/30/2017  . On total parenteral nutrition (TPN) 04/08/2017  .  Hematoma of rectus sheath 04/03/2017  . GERD (gastroesophageal reflux disease) 04/03/2017  . Malnutrition of moderate degree 02/07/2017  . Gastroparesis 02/06/2017  . Adenocarcinoma ([place]) 01/12/2017  . 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 Essential hypertension 02/11/2016  . Lumbar stenosis with neurogenic claudication 02/10/2016  . Hyperlipidemia 02/10/2016  . Hypothyroidism 02/10/2016  . Musculoskeletal neck pain 12/13/2014  . Muscular pain 12/13/2014  . Stroke ([place]) 09/17/2014  . Numbness   . Acute CVA (cerebrovascular accident) ([place]) 09/16/2014  . Right renal mass 09/13/2014  . Cerebral infarction due to unspecified mechanism   . CVA (cerebral infarction) 09/11/2014  . Osteoarthritis 09/11/2014  . Postsurgical hypothyroidism 09/11/2014  . History of CVA (cerebral vascular accident) ([place]) 09/11/2014  . Diabetes mellitus without complication ([place]) 67/89/3810  . Dyslipidemia 04/20/2007    Past Surgical History:  Procedure Laterality Date  . ABDOMINAL HYSTERECTOMY     partial  . cataracts     Removed  11/2015  bilateral  . COLONOSCOPY W/ POLYPECTOMY    . ERCP N/A 05/07/2016   Procedure: ENDOSCOPIC RETROGRADE CHOLANGIOPANCREATOGRAPHY (ERCP);  Surgeon: Clarene Essex, MD;  Location: [facility] ENDOSCOPY;  Service: Endoscopy;  Laterality: N/A;  . ERCP N/A 10/16/2016   Procedure: ENDOSCOPIC RETROGRADE CHOLANGIOPANCREATOGRAPHY (ERCP);  Surgeon: Clarene Essex, MD;  Location: [facility] ENDOSCOPY;  Service: Endoscopy;  Laterality: N/A;  . ESOPHAGOGASTRODUODENOSCOPY N/A 02/10/2017   Procedure: ESOPHAGOGASTRODUODENOSCOPY (EGD);  Surgeon: Teena Irani, MD;  Location: [facility] ENDOSCOPY;  Service: Endoscopy;  Laterality: N/A;  . EUS N/A 05/27/2016   Procedure: ESOPHAGEAL ENDOSCOPIC ULTRASOUND (EUS) RADIAL;  Surgeon: Arta Silence, MD;  Location: [facility] ENDOSCOPY;  Service: Endoscopy;  Laterality: N/A;  . FLEXIBLE SIGMOIDOSCOPY  03/29/2012   Procedure: FLEXIBLE SIGMOIDOSCOPY;  Surgeon: Jeryl Columbia, MD;  Location: [facility]  ENDOSCOPY;  Service: Endoscopy;  Laterality: N/A;  fleet enema upon arrival  . HOT HEMOSTASIS  03/29/2012   Procedure: HOT HEMOSTASIS (ARGON PLASMA COAGULATION/BICAP);  Surgeon: Jeryl Columbia, MD;  Location: [facility] ENDOSCOPY;  Service: Endoscopy;  Laterality: N/A;  . IR GASTR TUBE CONVERT GASTR-JEJ PER W/FL MOD SED  02/23/2017  . IR GASTROSTOMY TUBE MOD SED  02/16/2017  . IR GENERIC HISTORICAL  06/30/2016   IR RADIOLOGIST EVAL & MGMT 06/30/2016 Aletta Edouard, MD [facility] INTERV RAD  . IR GENERIC HISTORICAL  09/09/2016   IR RADIOLOGIST EVAL & MGMT 09/09/2016 Aletta Edouard, MD [facility] INTERV RAD  . IR IMAGING GUIDED PORT INSERTION  08/29/2019  . IR IVC FILTER PLMT / S&I /IMG GUID/MOD SED  03/03/2017  . IR PATIENT EVAL TECH 0-60 MINS  05/12/2017  . IR RADIOLOGIST EVAL & MGMT  11/24/2017  . IR RADIOLOGIST EVAL & MGMT  04/07/2018  . IR RADIOLOGIST EVAL & MGMT  05/18/2019  . LAPAROSCOPY N/A 01/12/2017   Procedure: LAPAROSCOPY DIAGNOSTIC;  Surgeon: Stark Klein, MD;  Location: [facility];  Service: General;  Laterality: N/A;  . LAPAROTOMY N/A 03/10/2017   Procedure: EXPLORATORY LAPAROTOMY Open jejunostomy tube;  Surgeon: [name],  Dorris Fetch, MD;  Location: [facility];  Service: General;  Laterality: N/A;  . LUMBAR LAMINECTOMY/DECOMPRESSION MICRODISCECTOMY N/A 02/10/2016   Procedure: Lumbar three- four Laminectomy;  Surgeon: Kristeen Miss, MD;  Location: [facility] NEURO ORS;  Service: Neurosurgery;  Laterality: N/A;  L3-4 Laminectomy  . LUMBAR SPINE SURGERY     1st surgery "ray cage placed"  . THYROIDECTOMY  2005  . TONSILLECTOMY    . WHIPPLE PROCEDURE N/A 01/12/2017   Procedure: WHIPPLE PROCEDURE;  Surgeon: Stark Klein, MD;  Location: [facility];  Service: General;  Laterality: N/A;     OB History   No obstetric history on file.     Family History  Problem Relation Age of Onset  . Heart failure Mother   . Heart attack Father     Social History   Tobacco Use  . Smoking status: Never Smoker  . Smokeless tobacco: Never Used   Vaping Use  . Vaping Use: Never used  Substance Use Topics  . Alcohol use: No  . Drug use: Yes    Types: Methamphetamines    Home Medications Prior to Admission medications   Medication Sig Start Date End Date Taking? Authorizing Provider  acetaminophen (TYLENOL) 500 MG tablet Take 500 mg by mouth every 6 (six) hours as needed for mild pain.   Yes [provider]  carvedilol (COREG) 25 MG tablet Take 1 tablet (25 mg total) by mouth 2 (two) times daily. 07/30/17  Yes Turner, Eber Hong, MD  furosemide (LASIX) 80 MG tablet TAKE 1 TABLET BY MOUTH EVERY DAY ALTERNATING WITH 40MG  06/20/20  Yes Fay Records, MD  gabapentin (NEURONTIN) 100 MG capsule Take 100 mg by mouth daily. 06/25/20  Yes [provider]  HUMALOG KWIKPEN 100 UNIT/ML KwikPen Inject 7-10 Units into the skin See admin instructions. Three times daily per sliding scale 06/30/18  Yes [provider]  insulin detemir (LEVEMIR) 100 UNIT/ML injection Inject 0.07 mLs (7 Units total) into the skin 2 (two) times daily. Patient taking differently: Inject 40 Units into the skin daily. 08/06/17  Yes Patrecia Pour, Christean Grief, MD  levothyroxine (SYNTHROID, LEVOTHROID) 200 MCG tablet Take 200 mcg by mouth daily before breakfast. For hypothyroidism 11/11/15  Yes [provider]  lidocaine-prilocaine (EMLA) cream Apply to port site 1-2 hours prior to use 08/22/19  Yes Owens Shark, NP  NIFEdipine (PROCARDIA-XL/ADALAT-CC/NIFEDICAL-XL) 30 MG 24 hr tablet Take 30 mg by mouth daily. 07/04/17  Yes [provider]  ondansetron (ZOFRAN) 8 MG tablet Take 1 tablet (8 mg total) by mouth every 8 (eight) hours as needed for nausea or vomiting. 08/03/19  Yes Owens Shark, NP  oxyCODONE-acetaminophen (PERCOCET/ROXICET) 5-325 MG tablet Take 1-2 tablets by mouth every 8 (eight) hours as needed for severe pain. 01/05/20  Yes Ladell Pier, MD  pantoprazole (PROTONIX) 40 MG tablet Take 1 tablet (40 mg total) by mouth at  bedtime. 02/04/17  Yes Stark Klein, MD  Polyethylene Glycol 3350 (MIRALAX PO) Take 17 g by mouth daily as needed (constipation).   Yes [provider]  potassium chloride SA (KLOR-CON M20) 20 MEQ tablet Take 3 tablets daily alternating with 1 tablet daily.  Take with furosemide. 11/22/17  Yes Fay Records, MD  rosuvastatin (CRESTOR) 20 MG tablet Take 20 mg by mouth daily.  07/27/19  Yes [provider]  Vitamin D, Ergocalciferol, (DRISDOL) 1.25 MG (50000 UNIT) CAPS capsule TAKE 1 CAPSULE BY MOUTH ONE TIME PER WEEK Patient taking differently: Take 50,000 Units by mouth every  Monday. 06/11/20  Yes Ladell Pier, MD  warfarin (COUMADIN) 7.5 MG tablet Take 1 tablet (7.5 mg total) by mouth one time only at 6 PM. 08/06/17  Yes Patrecia Pour, Christean Grief, MD    Allergies    Hydralazine hcl, Amlodipine besylate, Darvon [propoxyphene hcl], Nyquil multi-symptom [pseudoeph-doxylamine-dm-apap], and Metformin and related  Review of Systems   Review of Systems  Unable to perform ROS: Acuity of condition  Respiratory: Positive for shortness of breath.   Cardiovascular: Positive for leg swelling. Negative for chest pain.    Physical Exam Updated Vital Signs BP 106/65   Pulse 72   Temp 99.3 F (37.4 C) (Rectal)   Resp 18   Ht 5\' 4"  (1.626 m)   Wt 84.4 kg   SpO2 92%   BMI 31.93 kg/m   Physical Exam Vitals and nursing note reviewed.  Constitutional:      General: She is not in acute distress.    Appearance: She is well-developed and well-nourished.     Comments: Patient on BiPAP.  HENT:     Head: Normocephalic and atraumatic.     Nose: Nose normal.  Eyes:     General: No scleral icterus.       Left eye: No discharge.     Extraocular Movements: EOM normal.     Conjunctiva/sclera: Conjunctivae normal.  Cardiovascular:     Rate and Rhythm: Normal rate and regular rhythm.     Pulses: Intact distal pulses.     Heart sounds: Normal heart sounds. No murmur heard. No friction  rub. No gallop.   Pulmonary:     Effort: Pulmonary effort is normal. Tachypnea present. No respiratory distress.     Breath sounds: Normal breath sounds.  Abdominal:     General: Bowel sounds are normal. There is no distension.     Palpations: Abdomen is soft.     Tenderness: There is no abdominal tenderness. There is no guarding.  Musculoskeletal:        General: Normal range of motion.     Cervical back: Normal range of motion and neck supple.     Right lower leg: Edema present.     Left lower leg: Edema present.     Comments: Nonpitting edema in bilateral lower extremities  Skin:    General: Skin is warm and dry.     Findings: No rash.  Neurological:     Mental Status: She is alert.     Motor: No abnormal muscle tone.     Coordination: Coordination normal.  Psychiatric:        Mood and Affect: Mood and affect normal.     ED Results / Procedures / Treatments   Labs (all labs ordered are listed, but only abnormal results are displayed) Labs Reviewed  BASIC METABOLIC PANEL - Abnormal; Notable for the following components:      Result Value   Glucose, Bld 400 (*)    BUN 30 (*)    Creatinine, Ser 1.22 (*)    Calcium 7.9 (*)    GFR, Estimated 44 (*)    All other components within normal limits  CBC WITH DIFFERENTIAL/PLATELET - Abnormal; Notable for the following components:   RBC 2.99 (*)    Hemoglobin 9.3 (*)    HCT 28.5 (*)    RDW 18.2 (*)    nRBC 0.4 (*)    Neutro Abs 8.3 (*)    Lymphs Abs 0.6 (*)    Abs Immature Granulocytes 0.30 (*)  All other components within normal limits  BRAIN NATRIURETIC PEPTIDE - Abnormal; Notable for the following components:   B Natriuretic Peptide 391.6 (*)    All other components within normal limits  BLOOD GAS, VENOUS - Abnormal; Notable for the following components:   Bicarbonate 29.7 (*)    Acid-Base Excess 5.2 (*)    All other components within normal limits  PROTIME-INR - Abnormal; Notable for the following components:    Prothrombin Time 35.6 (*)    INR 3.7 (*)    All other components within normal limits  HEPATIC FUNCTION PANEL - Abnormal; Notable for the following components:   Total Protein 6.1 (*)    Albumin 1.9 (*)    AST 61 (*)    All other components within normal limits  TROPONIN I (HIGH SENSITIVITY) - Abnormal; Notable for the following components:   Troponin I (High Sensitivity) 115 (*)    All other components within normal limits  TROPONIN I (HIGH SENSITIVITY) - Abnormal; Notable for the following components:   Troponin I (High Sensitivity) 124 (*)    All other components within normal limits  RESP PANEL BY RT-PCR (FLU A&B, COVID) [id number]  TROPONIN I (HIGH SENSITIVITY)    EKG EKG Interpretation  Date/Time:  Friday August 09 2020 06:47:47 EST Ventricular Rate:  110 PR Interval:    QRS Duration: 89 QT Interval:  348 QTC Calculation: 469 R Axis:   -31 Text Interpretation: Ectopic atrial tachycardia, unifocal Atrial premature complexes Left axis deviation Probable anterior infarct, old Confirmed by Lennice Sites [phone]) on 08/09/2020 7:28:18 AM   Radiology DG Chest Portable 1 View  Result Date: 08/09/2020 CLINICAL DATA:  Shortness of breath for 1 day EXAM: PORTABLE CHEST 1 VIEW COMPARISON:  04/30/2020 FINDINGS: Cardiomegaly. Prominent markings at the bases correlating with pleural opacity on the right and scarring on the left by chest CT 04/30/2020. No acute airspace disease, edema, effusion, or pneumothorax. Porta catheter on the right tip at the upper cavoatrial junction. IMPRESSION: No evidence of acute disease. Electronically Signed   By: Monte Fantasia M.D.   On: 08/09/2020 07:09   ECHOCARDIOGRAM COMPLETE  Result Date: 08/09/2020    ECHOCARDIOGRAM REPORT   Patient Name:   Kaylee Mullins Date of Exam: 08/09/2020 Medical Rec #:  [id number]       Height:       64.0 in Accession #:    [id number]      Weight:       186.0 lb Date of Birth:  1936-04-06      BSA:          1.897 m  Patient Age:    16 years        BP:           109/54 mmHg Patient Gender: F               HR:           74 bpm. Exam Location:  Inpatient Procedure: 2D Echo, Cardiac Doppler and Color Doppler Indications:    Dyspnea  History:        Patient has prior history of Echocardiogram examinations, most                 recent 06/28/2017. Stroke, Arrythmias:SVT,                 Signs/Symptoms:Dyspnea; Risk Factors:Diabetes and Hypertension.                 Elevated Trponin,  chemo.  Sonographer:    Dustin Flock Referring Phys: [id number] [place]  1. Mild septal hypokinesis. Left ventricular ejection fraction, by estimation, is 50 to 55%. The left ventricle has low normal function. The left ventricle has no regional wall motion abnormalities. Left ventricular diastolic parameters are consistent with Grade II diastolic dysfunction (pseudonormalization). Elevated left ventricular end-diastolic pressure.  2. Right ventricular systolic function is normal. The right ventricular size is normal.  3. Left atrial size was mildly dilated.  4. The mitral valve is normal in structure. Trivial mitral valve regurgitation. No evidence of mitral stenosis.  5. The aortic valve is calcified. There is mild calcification of the aortic valve. There is mild thickening of the aortic valve. Aortic valve regurgitation is mild. No aortic stenosis is present.  6. The inferior vena cava is normal in size with greater than 50% respiratory variability, suggesting right atrial pressure of 3 mmHg. FINDINGS  Left Ventricle: Mild septal hypokinesis. Left ventricular ejection fraction, by estimation, is 50 to 55%. The left ventricle has low normal function. The left ventricle has no regional wall motion abnormalities. The left ventricular internal cavity size  was normal in size. There is no left ventricular hypertrophy. Left ventricular diastolic parameters are consistent with Grade II diastolic dysfunction (pseudonormalization).  Elevated left ventricular end-diastolic pressure. Right Ventricle: The right ventricular size is normal. No increase in right ventricular wall thickness. Right ventricular systolic function is normal. Left Atrium: Left atrial size was mildly dilated. Right Atrium: Right atrial size was normal in size. Pericardium: Trivial pericardial effusion is present. Mitral Valve: The mitral valve is normal in structure. There is moderate thickening of the mitral valve leaflet(s). Trivial mitral valve regurgitation. No evidence of mitral valve stenosis. Tricuspid Valve: The tricuspid valve is normal in structure. Tricuspid valve regurgitation is trivial. No evidence of tricuspid stenosis. Aortic Valve: The aortic valve is calcified. There is mild calcification of the aortic valve. There is mild thickening of the aortic valve. Aortic valve regurgitation is mild. Aortic regurgitation PHT measures 397 msec. No aortic stenosis is present. Pulmonic Valve: The pulmonic valve was normal in structure. Pulmonic valve regurgitation is not visualized. No evidence of pulmonic stenosis. Aorta: The aortic root is normal in size and structure. Venous: The inferior vena cava is normal in size with greater than 50% respiratory variability, suggesting right atrial pressure of 3 mmHg. IAS/Shunts: No atrial level shunt detected by color flow Doppler.  LEFT VENTRICLE PLAX 2D LVIDd:         4.50 cm  Diastology LVIDs:         3.20 cm  LV e' medial:    4.79 cm/s LV PW:         1.50 cm  LV E/e' medial:  17.1 LV IVS:        1.20 cm  LV e' lateral:   7.18 cm/s LVOT diam:     2.30 cm  LV E/e' lateral: 11.4 LV SV:         57 LV SV Index:   30 LVOT Area:     4.15 cm  RIGHT VENTRICLE RV Basal diam:  2.40 cm RV S prime:     8.27 cm/s TAPSE (M-mode): 2.0 cm LEFT ATRIUM             Index       RIGHT ATRIUM           Index LA diam:        5.20 cm 2.74  cm/m  RA Area:     12.70 cm LA Vol (A2C):   60.4 ml 31.84 ml/m RA Volume:   24.20 ml  12.76 ml/m LA Vol  (A4C):   58.4 ml 30.79 ml/m LA Biplane Vol: 59.9 ml 31.58 ml/m  AORTIC VALVE LVOT Vmax:   69.80 cm/s LVOT Vmean:  45.900 cm/s LVOT VTI:    0.137 m AI PHT:      397 msec  AORTA Ao Root diam: 2.90 cm MITRAL VALVE MV Area (PHT): 3.31 cm    SHUNTS MV Decel Time: 229 msec    Systemic VTI:  0.14 m MV E velocity: 81.80 cm/s  Systemic Diam: 2.30 cm MV A velocity: 78.00 cm/s MV E/A ratio:  1.05 Skeet Latch MD Electronically signed by Skeet Latch MD Signature Date/Time: 08/09/2020/2:47:19 PM    Final     Procedures Procedures (including critical care time)  Medications Ordered in ED Medications  oxyCODONE-acetaminophen (PERCOCET/ROXICET) 5-325 MG per tablet 1 tablet (1 tablet Oral Given 08/09/20 1148)    ED Course  I have reviewed the triage vital signs and the nursing notes.  Pertinent labs & imaging results that were available during my care of the patient were reviewed by me and considered in my medical decision making (see chart for details).  Clinical Course as of 08/09/20 1515  Fri Aug 09, 2020  [id number] Will call respiratory to transition patient off of BiPAP. [HK]  0748 Creatinine(!): 1.22 Baseline around 1. [HK]  [id number] Patient transition to 3 L of oxygen via nasal cannula with oxygen saturations at 100%.  She reports improvement in her shortness of breath [HK]  0811 Oral temp is 98.50F [HK]  0824 Hemoglobin(!): 9.3 Around baseline. [HK]  [id number] Patient taken off of supplemental oxygen with sats remaining above 95%. [HK]  [id number] B Natriuretic Peptide(!): 391.6 [HK]  0939 pH, Ven: 7.406 [HK]  [id number] INR(!): 3.7 Will hold coumadin for one day and have her recheck it as early as possible next week after holiday weekend. [HK]  24 SARS Coronavirus 2 by RT PCR: NEGATIVE [HK]    Clinical Course User Index [HK] Delia Heady, PA-C   MDM Rules/Calculators/A&P                          84 year old female with past medical history of prior stroke, prior DVT, diabetes, CHF, CKD currently on  Coumadin, ampullary carcinoma currently undergoing chemotherapy presenting to the ED with a chief complaint of shortness of breath. Daughter states shortness of breath, decreased appetite energy level for the past few days and gradually worsened in the past 24 hours. She reports compliance with her home medications and only missed a day of medications 2 days ago. No chest pain. Denies any urinary symptoms. She does have some intermittent lower abdominal pain but none currently. Patient arrived on BiPAP. She was shortly transitioned to 3 L of nasal cannula with improvement in her shortness of breath. She was then transitioned to room air maintaining oxygen saturations above 94%. She states that she feels much better. She continues to endorse no chest pain. EKG shows ectopic atrial tachycardia without any evidence of ischemia or STEMI. Chest x-ray without any acute findings. Her INR is supratherapeutic at 3.7. Hemoglobin of 9.3 which is similar to baseline. Creatinine slightly elevated to 1.2 and her baseline appears around 1. She does not appear fluid overloaded, she does have lower extremity edema that she states is chronic and is nonpitting. Her first  troponin collected here was 124 and 1 hour later was 115. Will consult cardiology for further recommendations. She remains hemodynamically stable. Her disposition will be determined by cardiology. Please see their notes for further detail.   Portions of this note were generated with [employer]. Dictation errors may occur despite best attempts at proofreading.  Final Clinical Impression(s) / ED Diagnoses Final diagnoses:  Dehydration  Supratherapeutic INR    Rx / DC Orders ED Discharge Orders    None       Delia Heady, PA-C 08/09/20 1515    Ripley Fraise, MD 08/09/20 2051

## 2020-08-10 ENCOUNTER — Encounter (HOSPITAL_COMMUNITY): Payer: Self-pay | Admitting: Internal Medicine

## 2020-08-10 DIAGNOSIS — I5033 Acute on chronic diastolic (congestive) heart failure: Secondary | ICD-10-CM

## 2020-08-10 DIAGNOSIS — R0989 Other specified symptoms and signs involving the circulatory and respiratory systems: Secondary | ICD-10-CM | POA: Diagnosis not present

## 2020-08-10 DIAGNOSIS — K219 Gastro-esophageal reflux disease without esophagitis: Secondary | ICD-10-CM | POA: Diagnosis present

## 2020-08-10 DIAGNOSIS — C787 Secondary malignant neoplasm of liver and intrahepatic bile duct: Secondary | ICD-10-CM | POA: Diagnosis present

## 2020-08-10 DIAGNOSIS — I471 Supraventricular tachycardia: Secondary | ICD-10-CM | POA: Diagnosis present

## 2020-08-10 DIAGNOSIS — C779 Secondary and unspecified malignant neoplasm of lymph node, unspecified: Secondary | ICD-10-CM | POA: Diagnosis present

## 2020-08-10 DIAGNOSIS — N1831 Chronic kidney disease, stage 3a: Secondary | ICD-10-CM | POA: Diagnosis present

## 2020-08-10 DIAGNOSIS — Z20822 Contact with and (suspected) exposure to covid-19: Secondary | ICD-10-CM | POA: Diagnosis present

## 2020-08-10 DIAGNOSIS — I69354 Hemiplegia and hemiparesis following cerebral infarction affecting left non-dominant side: Secondary | ICD-10-CM | POA: Diagnosis not present

## 2020-08-10 DIAGNOSIS — I5031 Acute diastolic (congestive) heart failure: Secondary | ICD-10-CM | POA: Diagnosis not present

## 2020-08-10 DIAGNOSIS — E860 Dehydration: Secondary | ICD-10-CM | POA: Diagnosis present

## 2020-08-10 DIAGNOSIS — R791 Abnormal coagulation profile: Secondary | ICD-10-CM | POA: Diagnosis not present

## 2020-08-10 DIAGNOSIS — R109 Unspecified abdominal pain: Secondary | ICD-10-CM | POA: Diagnosis not present

## 2020-08-10 DIAGNOSIS — J9621 Acute and chronic respiratory failure with hypoxia: Secondary | ICD-10-CM | POA: Diagnosis present

## 2020-08-10 DIAGNOSIS — E1122 Type 2 diabetes mellitus with diabetic chronic kidney disease: Secondary | ICD-10-CM | POA: Diagnosis present

## 2020-08-10 DIAGNOSIS — Z8673 Personal history of transient ischemic attack (TIA), and cerebral infarction without residual deficits: Secondary | ICD-10-CM | POA: Diagnosis not present

## 2020-08-10 DIAGNOSIS — C241 Malignant neoplasm of ampulla of Vater: Secondary | ICD-10-CM | POA: Diagnosis present

## 2020-08-10 DIAGNOSIS — N390 Urinary tract infection, site not specified: Secondary | ICD-10-CM | POA: Diagnosis present

## 2020-08-10 DIAGNOSIS — E875 Hyperkalemia: Secondary | ICD-10-CM | POA: Diagnosis present

## 2020-08-10 DIAGNOSIS — E785 Hyperlipidemia, unspecified: Secondary | ICD-10-CM | POA: Diagnosis present

## 2020-08-10 DIAGNOSIS — R0602 Shortness of breath: Secondary | ICD-10-CM | POA: Diagnosis present

## 2020-08-10 DIAGNOSIS — Y929 Unspecified place or not applicable: Secondary | ICD-10-CM | POA: Diagnosis not present

## 2020-08-10 DIAGNOSIS — I639 Cerebral infarction, unspecified: Secondary | ICD-10-CM | POA: Diagnosis not present

## 2020-08-10 DIAGNOSIS — Z888 Allergy status to other drugs, medicaments and biological substances status: Secondary | ICD-10-CM | POA: Diagnosis not present

## 2020-08-10 DIAGNOSIS — R7989 Other specified abnormal findings of blood chemistry: Secondary | ICD-10-CM | POA: Diagnosis not present

## 2020-08-10 DIAGNOSIS — J9811 Atelectasis: Secondary | ICD-10-CM | POA: Diagnosis present

## 2020-08-10 DIAGNOSIS — R4182 Altered mental status, unspecified: Secondary | ICD-10-CM | POA: Diagnosis not present

## 2020-08-10 DIAGNOSIS — I2510 Atherosclerotic heart disease of native coronary artery without angina pectoris: Secondary | ICD-10-CM | POA: Diagnosis present

## 2020-08-10 DIAGNOSIS — D638 Anemia in other chronic diseases classified elsewhere: Secondary | ICD-10-CM | POA: Diagnosis present

## 2020-08-10 DIAGNOSIS — Z981 Arthrodesis status: Secondary | ICD-10-CM | POA: Diagnosis not present

## 2020-08-10 DIAGNOSIS — R778 Other specified abnormalities of plasma proteins: Secondary | ICD-10-CM | POA: Diagnosis not present

## 2020-08-10 DIAGNOSIS — T451X5A Adverse effect of antineoplastic and immunosuppressive drugs, initial encounter: Secondary | ICD-10-CM | POA: Diagnosis present

## 2020-08-10 DIAGNOSIS — I130 Hypertensive heart and chronic kidney disease with heart failure and stage 1 through stage 4 chronic kidney disease, or unspecified chronic kidney disease: Secondary | ICD-10-CM | POA: Diagnosis present

## 2020-08-10 DIAGNOSIS — N179 Acute kidney failure, unspecified: Secondary | ICD-10-CM | POA: Diagnosis present

## 2020-08-10 HISTORY — DX: Acute on chronic diastolic (congestive) heart failure: I50.33

## 2020-08-10 LAB — COMPREHENSIVE METABOLIC PANEL
ALT: 30 U/L (ref 0–44)
AST: 67 U/L — ABNORMAL HIGH (ref 15–41)
Albumin: 1.8 g/dL — ABNORMAL LOW (ref 3.5–5.0)
Alkaline Phosphatase: 145 U/L — ABNORMAL HIGH (ref 38–126)
Anion gap: 14 (ref 5–15)
BUN: 33 mg/dL — ABNORMAL HIGH (ref 8–23)
CO2: 25 mmol/L (ref 22–32)
Calcium: 8 mg/dL — ABNORMAL LOW (ref 8.9–10.3)
Chloride: 97 mmol/L — ABNORMAL LOW (ref 98–111)
Creatinine, Ser: 1.45 mg/dL — ABNORMAL HIGH (ref 0.44–1.00)
GFR, Estimated: 36 mL/min — ABNORMAL LOW (ref >60)
Glucose, Bld: 495 mg/dL — ABNORMAL HIGH (ref 70–99)
Potassium: 3.5 mmol/L (ref 3.5–5.1)
Sodium: 136 mmol/L (ref 135–145)
Total Bilirubin: 0.5 mg/dL (ref 0.3–1.2)
Total Protein: 6.2 g/dL — ABNORMAL LOW (ref 6.5–8.1)

## 2020-08-10 LAB — HEMOGLOBIN A1C
Hgb A1c MFr Bld: 8.3 % — ABNORMAL HIGH (ref 4.8–5.6)
Mean Plasma Glucose: 191.51 mg/dL

## 2020-08-10 LAB — GLUCOSE, CAPILLARY
Glucose-Capillary: 404 mg/dL — ABNORMAL HIGH (ref 70–99)
Glucose-Capillary: 265 mg/dL — ABNORMAL HIGH (ref 70–99)
Glucose-Capillary: 174 mg/dL — ABNORMAL HIGH (ref 70–99)
Glucose-Capillary: 118 mg/dL — ABNORMAL HIGH (ref 70–99)

## 2020-08-10 LAB — CBC
HCT: 28 % — ABNORMAL LOW (ref 36.0–46.0)
Hemoglobin: 8.9 g/dL — ABNORMAL LOW (ref 12.0–15.0)
MCH: 29.7 pg (ref 26.0–34.0)
MCHC: 31.8 g/dL (ref 30.0–36.0)
MCV: 93.3 fL (ref 80.0–100.0)
Platelets: 191 10*3/uL (ref 150–400)
RBC: 3 MIL/uL — ABNORMAL LOW (ref 3.87–5.11)
RDW: 18.2 % — ABNORMAL HIGH (ref 11.5–15.5)
WBC: 11.7 10*3/uL — ABNORMAL HIGH (ref 4.0–10.5)
nRBC: 0.3 % — ABNORMAL HIGH (ref 0.0–0.2)

## 2020-08-10 LAB — PROTIME-INR
INR: 4.6 (ref 0.8–1.2)
Prothrombin Time: 42.4 seconds — ABNORMAL HIGH (ref 11.4–15.2)

## 2020-08-10 LAB — TSH: TSH: 6.934 u[IU]/mL — ABNORMAL HIGH (ref 0.350–4.500)

## 2020-08-10 MED ORDER — FUROSEMIDE 10 MG/ML IJ SOLN: 40.0000 mg | Freq: Once | INTRAMUSCULAR | Status: DC

## 2020-08-10 MED ORDER — FUROSEMIDE 10 MG/ML IJ SOLN
40.0000 mg | Freq: Two times a day (BID) | INTRAMUSCULAR | Status: DC
  Administered 2020-08-10: 16:00:00 40 mg via INTRAVENOUS
  Filled 2020-08-10: qty 4

## 2020-08-10 MED ORDER — ASPIRIN 81 MG PO CHEW: 81.0000 mg | CHEWABLE_TABLET | Freq: Every day | ORAL | Status: DC

## 2020-08-10 NOTE — Evaluation (Signed)
Physical Therapy Evaluation Patient Details Name: Kaylee Mullins MRN: [id number] DOB: 1936-06-29 Today's Date: 08/10/2020   History of Present Illness  Pt is an 84 y.o. female admitted 08/09/20 with generalized weakness and SOB; reports she has not been feeling well since chemo tx last Friday. Workup for acute hypoxic respiratory failure, initially requiring BiPAP. PMH includes CVA, CKD, DM, HTN, SVT, OA, depression, anxiety.    Clinical Impression  Pt presents with an overall decrease in functional mobility secondary to above. Pt inconsistent historian, son present to verify information; PTA, pt mod indep with rollator, lives alone with son and daughter nearby who assist as needed. Today, pt required maxA to come to sitting EOB, difficulty maintaining seated balance, c/o significant fatigue; dependent for pericare at bed-level due to urine incontinence. Will require +2 assist for OOB mobility; recommend use of maximove lift with nursing staff. Pt would benefit from continued acute PT services to maximize functional mobility and independence prior to d/c with SNF-level therapies; son in agreement, pt willing to consider.     Follow Up Recommendations SNF;Supervision for mobility/OOB    Equipment Recommendations  3in1 (PT);Wheelchair (measurements PT);Wheelchair cushion (measurements PT)    Recommendations for Other Services       Precautions / Restrictions Precautions Precautions: Fall;Other (comment) Precaution Comments: Urine incontinence Restrictions Weight Bearing Restrictions: No      Mobility  Bed Mobility Overal bed mobility: Needs Assistance Bed Mobility: Supine to Sit;Sit to Supine;Rolling Rolling: Mod assist;+2 for safety/equipment   Supine to sit: Max assist;HOB elevated Sit to supine: Max assist   General bed mobility comments: Pt with slowed processing and little effort coming to EOB, reports significant fatigue, max cues for sequencing and maxA to assist trunk  elevation and scooting hips to EOB; maxA return to supine. ModA to roll R/L for pericare and linen change due to urine incontinence, pt able to assist well when cued to use rail support; frequent cues for sequencing.    Transfers                 General transfer comment: Pt declining secondary to fatigue, requesting return to supine; will require +2 assist  Ambulation/Gait                Stairs            Wheelchair Mobility    Modified Rankin (Stroke Patients Only)       Balance Overall balance assessment: Needs assistance   Sitting balance-Leahy Scale: Poor Sitting balance - Comments: Preferring to lean on elbow against HOB; max cues and assist to achieve fully upright sitting, min-modA to maintain, reliant on UE support                                     Pertinent Vitals/Pain Pain Assessment: Faces Faces Pain Scale: Hurts a little bit Pain Location: Generalized Pain Descriptors / Indicators: Grimacing;Tiring Pain Intervention(s): Monitored during session;Limited activity within patient's tolerance    Home Living Family/patient expects to be discharged to:: Skilled nursing facility Living Arrangements: Alone Available Help at Discharge: Family;Available PRN/intermittently Type of Home: House Home Access: Stairs to enter     Home Layout: Multi-level Home Equipment: Environmental consultant - 4 wheels Additional Comments: Lives alone; daughter and son nearby to check on and assist as needed    Prior Function Level of Independence: Independent with assistive device(s)  Comments: Mod indep with rollator, uses stair lift at home. Requiring increased assist from children due to fall and weakness during week leading to admission     Hand Dominance        Extremity/Trunk Assessment   Upper Extremity Assessment Upper Extremity Assessment: Generalized weakness;Difficult to assess due to impaired cognition    Lower Extremity  Assessment Lower Extremity Assessment: Generalized weakness;Difficult to assess due to impaired cognition    Cervical / Trunk Assessment Cervical / Trunk Assessment: Kyphotic  Communication   Communication: HOH  Cognition Arousal/Alertness: Lethargic Behavior During Therapy: Flat affect Overall Cognitive Status: Impaired/Different from baseline Area of Impairment: Attention;Following commands;Safety/judgement;Awareness;Problem solving                   Current Attention Level: Focused;Sustained   Following Commands: Follows one step commands with increased time Safety/Judgement: Decreased awareness of deficits;Decreased awareness of safety Awareness: Emergent Problem Solving: Slow processing;Decreased initiation;Difficulty sequencing;Requires verbal cues General Comments: A&Ox4. At first falling asleep during conversation, requiring frequent cues to wake up and attend to task, difficulty answering PLOF/home set-up questions. More alert with mobility, having appropriate, although slowed, conversation at end of session regarding SNF recommendation      General Comments General comments (skin integrity, edema, etc.): Son present and supportive. NT present for +2 assist with pericare/linen change at bed level; pt unaware of urine incontinence. Discussed d/c recommendation for SNF with pt and son; son agreeable, pt willing to consider    Exercises     Assessment/Plan    PT Assessment Patient needs continued PT services  PT Problem List Decreased strength;Decreased activity tolerance;Decreased balance;Decreased mobility;Decreased cognition;Decreased knowledge of use of DME;Obesity       PT Treatment Interventions DME instruction;Gait training;Functional mobility training;Therapeutic activities;Therapeutic exercise;Balance training;Patient/family education;Wheelchair mobility training    PT Goals (Current goals can be found in the Care Plan section)  Acute Rehab PT  Goals Patient Stated Goal: Son agreeable to SNF, pt willing to consider; ultimate goal to return home and to independence PT Goal Formulation: With patient/family Time For Goal Achievement: 08/24/20 Potential to Achieve Goals: Good    Frequency Min 2X/week   Barriers to discharge Decreased caregiver support      Co-evaluation               AM-PAC PT "6 Clicks" Mobility  Outcome Measure Help needed turning from your back to your side while in a flat bed without using bedrails?: A Lot Help needed moving from lying on your back to sitting on the side of a flat bed without using bedrails?: A Lot Help needed moving to and from a bed to a chair (including a wheelchair)?: A Lot Help needed standing up from a chair using your arms (e.g., wheelchair or bedside chair)?: A Lot Help needed to walk in hospital room?: A Lot Help needed climbing 3-5 steps with a railing? : Total 6 Click Score: 11    End of Session   Activity Tolerance: Patient limited by fatigue Patient left: in bed;with call bell/phone within reach;with bed alarm set;with family/visitor present Nurse Communication: Mobility status PT Visit Diagnosis: Other abnormalities of gait and mobility (R26.89);Muscle weakness (generalized) (M62.81)    Time: 1497-0263 PT Time Calculation (min) (ACUTE ONLY): 34 min   Charges:   PT Evaluation $PT Eval Moderate Complexity: 1 Mod PT Treatments $Therapeutic Activity: 8-22 mins       Mabeline Caras, PT, DPT Acute Rehabilitation Services  Pager [phone] Office [place] 08/10/2020,  2:29 PM

## 2020-08-10 NOTE — Progress Notes (Addendum)
AM POC CBG of 404.  CMP drawn earlier this AM showed serum glucose of 495.   MD paged to notify.    Addtionally - received critical INR of 4.6 from lab.  Paged on call MD at [phone] to notify.

## 2020-08-10 NOTE — Progress Notes (Signed)
PROGRESS NOTE  Kaylee Mullins [id number] DOB: 19-Nov-1935 DOA: 08/09/2020 PCP: Nolene Ebbs, MD   LOS: 0 days   Brief Narrative / Interim history: 84 y.o. 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  Subjective / 24h Interval events: Tells me she is persistently short of breath this morning.  Has not been out of bed.  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 History of Whipple  Anemia of chronic disease -Hemoglobin stable, no bleeding  History of thyroid cancer -On thyroid supplementations, TSH currently 6.9  Type 2 diabetes mellitus -Continue long-acting insulin, placed on sliding scale, carb modified diet  CBG (last 3)  Recent Labs    08/09/20 2034 08/10/20 0442 08/10/20 1117  GLUCAP 397* 404* 265*    Essential hypertension -Blood pressure normal in the ED, restart home Coreg but hold oral furosemide as she will get IV, also hold nifedipine to allow diuresis  Hyperlipidemia -Continue statin  History of DVT -INR supratherapeutic, start Coumadin per pharmacy. Of note, she has a history of rectus sheath hematoma in 2018 when she was treated with Lovenox, and had an IVC filter placed at that time   Elevated troponin -Possibly demand, trending down. No chest  pain  Aortic atherosclerosis, vascular disease, coronary calcifications on CT scan -Noted   Scheduled Meds: . carvedilol  25 mg Oral BID  . furosemide  40 mg Intravenous BID  . gabapentin  100 mg Oral Daily  . insulin aspart  0-9 Units Subcutaneous TID WC  . insulin detemir  7 Units Subcutaneous BID  . levothyroxine  200 mcg Oral QAC breakfast  . pantoprazole  40 mg Oral QHS  . rosuvastatin  20 mg Oral Daily  . sodium chloride flush  3 mL Intravenous Q12H  . Warfarin - Pharmacist Dosing Inpatient   Does not apply q1600   Continuous Infusions: . sodium chloride     PRN Meds:.sodium chloride, ondansetron, oxyCODONE-acetaminophen, polyethylene glycol, sodium chloride flush  Diet Orders (From admission, onward)    Start     Ordered   08/09/20 1940  Diet heart healthy/carb modified Room service appropriate? Yes; Fluid consistency: Thin  Diet effective now       Question Answer Comment  Diet-HS Snack? Nothing   Room service appropriate? Yes   Fluid consistency: Thin      08/09/20 1939          DVT prophylaxis:      Code Status: Full Code  Family Communication: none  Status is: Observation  The patient will require care spanning > 2 midnights and should be moved to inpatient because: Inpatient level of care appropriate due to severity of illness  Dispo: The patient is from: Home              Anticipated d/c is to: Home              Anticipated d/c date is: 2 days  Patient currently is not medically stable to d/c.   Consultants:  Cardiology   Procedures:  2D echo  Microbiology  None   Antimicrobials: None     Objective: Vitals:   08/10/20 0008 08/10/20 0008 08/10/20 0441 08/10/20 0752  BP:  117/64 (!) 113/52 (!) 121/58  Pulse: 80 (!) 115 72 69  Resp:  18 20 16   Temp:  98 F (36.7 C) 97.6 F (36.4 C) 98.2 F (36.8 C)  TempSrc:  Oral Oral Oral  SpO2:  94% 95% 96%  Weight:  83.3 kg    Height:        Intake/Output Summary (Last 24  hours) at 08/10/2020 1326 Last data filed at 08/10/2020 0900 Gross per 24 hour  Intake 360 ml  Output 275 ml  Net 85 ml   Filed Weights   08/09/20 0641 08/09/20 1943 08/10/20 0008  Weight: 84.4 kg 82.9 kg 83.3 kg    Examination:  Constitutional: NAD Eyes: no scleral icterus ENMT: Mucous membranes are moist.  Neck: normal, supple Respiratory: clear to auscultation bilaterally, no wheezing, no crackles. Normal respiratory effort. Cardiovascular: Regular rate and rhythm, no murmurs / rubs / gallops. 2+ LE edema. Abdomen: non distended, no tenderness. Bowel sounds positive.  Musculoskeletal: no clubbing / cyanosis.  Skin: no rashes Neurologic: CN 2-12 grossly intact. Strength 5/5 in all 4.   Data Reviewed: I have independently reviewed following labs and imaging studies   CBC: Recent Labs  Lab 08/09/20 0700 08/10/20 0227  WBC 9.9 11.7*  NEUTROABS 8.3*  --   HGB 9.3* 8.9*  HCT 28.5* 28.0*  MCV 95.3 93.3  PLT 153 [id number]   Basic Metabolic Panel: Recent Labs  Lab 08/09/20 0700 08/10/20 0227  NA 137 136  K 3.8 3.5  CL 100 97*  CO2 25 25  GLUCOSE 400* 495*  BUN 30* 33*  CREATININE 1.22* 1.45*  CALCIUM 7.9* 8.0*   Liver Function Tests: Recent Labs  Lab 08/09/20 0900 08/10/20 0227  AST 61* 67*  ALT 25 30  ALKPHOS 125 145*  BILITOT 0.8 0.5  PROT 6.1* 6.2*  ALBUMIN 1.9* 1.8*   Coagulation Profile: Recent Labs  Lab 08/09/20 0900 08/10/20 0227  INR 3.7* 4.6*   HbA1C: Recent Labs    08/10/20 0227  HGBA1C 8.3*   CBG: Recent Labs  Lab 08/09/20 2034 08/10/20 0442 08/10/20 1117  GLUCAP 397* 404* 265*    Recent Results (from the past 240 hour(s))  Resp Panel by RT-PCR (Flu A&B, Covid) Nasopharyngeal Swab     Status: None   Collection Time: 08/09/20  9:12 AM   Specimen: Nasopharyngeal Swab; Nasopharyngeal(NP) swabs in vial transport medium  Result Value Ref Range Status   SARS Coronavirus 2 by RT PCR NEGATIVE NEGATIVE Final    Comment:  (NOTE) SARS-CoV-2 target nucleic acids are NOT DETECTED.  The SARS-CoV-2 RNA is generally detectable in upper respiratory specimens during the acute phase of infection. The lowest concentration of SARS-CoV-2 viral copies this assay can detect is 138 copies/mL. A negative result does not preclude SARS-Cov-2 infection and should not be used as the sole basis for treatment or other patient management decisions. A negative result may occur with  improper specimen collection/handling, submission of specimen other than nasopharyngeal swab, presence of viral mutation(s) within the areas targeted by this assay, and inadequate number of viral copies(<138 copies/mL). A negative result must be combined with clinical observations, patient history, and epidemiological information. The expected result is Negative.  Fact Sheet for Patients:  EntrepreneurPulse.com.au  Fact Sheet for Healthcare Providers:  IncredibleEmployment.be  This test is no t yet approved or cleared by the [place] FDA and  has been authorized for detection and/or diagnosis of SARS-CoV-2 by FDA under an Emergency Use Authorization (EUA). This EUA will remain  in effect (meaning this test can be used) for the duration of the COVID-19 declaration under Section 564(b)(1) of the Act, 21 U.S.C.section 360bbb-3(b)(1), unless the authorization is terminated  or revoked sooner.       Influenza A by PCR NEGATIVE NEGATIVE Final   Influenza B by PCR NEGATIVE NEGATIVE Final    Comment: (NOTE) The Xpert Xpress SARS-CoV-2/FLU/RSV plus assay is intended as an aid in the diagnosis of influenza from Nasopharyngeal swab specimens and should not be used as a sole basis for treatment. Nasal washings and aspirates are unacceptable for Xpert Xpress SARS-CoV-2/FLU/RSV testing.  Fact Sheet for Patients: EntrepreneurPulse.com.au  Fact Sheet for Healthcare  Providers: IncredibleEmployment.be  This test is not yet approved or cleared by the [place] FDA and has been authorized for detection and/or diagnosis of SARS-CoV-2 by FDA under an Emergency Use Authorization (EUA). This EUA will remain in effect (meaning this test can be used) for the duration of the COVID-19 declaration under Section 564(b)(1) of the Act, 21 U.S.C. section 360bbb-3(b)(1), unless the authorization is terminated or revoked.  Performed at [facility], [place] [address] [place], [place] [address]      Radiology Studies: ECHOCARDIOGRAM COMPLETE  Result Date: 08/09/2020    ECHOCARDIOGRAM REPORT   Patient Name:   Kaylee Mullins Date of Exam: 08/09/2020 Medical Rec #:  [id number]       Height:       64.0 in Accession #:    [id number]      Weight:       186.0 lb Date of Birth:  03-07-1936      BSA:          1.897 m Patient Age:    36 years        BP:           109/54 mmHg Patient Gender: F               HR:           74 bpm. Exam Location:  Inpatient Procedure: 2D Echo, Cardiac Doppler and Color Doppler Indications:    Dyspnea  History:        Patient has prior history of Echocardiogram examinations, most                 recent 06/28/2017. Stroke, Arrythmias:SVT,                 Signs/Symptoms:Dyspnea; Risk Factors:Diabetes and Hypertension.                 Elevated Trponin, chemo.  Sonographer:    Dustin Flock Referring Phys: [id number] [place]  1. Mild septal hypokinesis. Left ventricular ejection fraction, by estimation, is 50 to 55%. The left ventricle has low normal function. The left ventricle has no regional wall motion abnormalities. Left ventricular diastolic parameters are consistent with Grade II diastolic dysfunction (pseudonormalization). Elevated left ventricular end-diastolic pressure.  2. Right ventricular systolic function is normal. The right ventricular size is normal.  3. Left atrial size was mildly dilated.  4.  The mitral valve is normal in structure. Trivial mitral valve regurgitation. No evidence of mitral stenosis.  5. The aortic valve is calcified.  There is mild calcification of the aortic valve. There is mild thickening of the aortic valve. Aortic valve regurgitation is mild. No aortic stenosis is present.  6. The inferior vena cava is normal in size with greater than 50% respiratory variability, suggesting right atrial pressure of 3 mmHg. FINDINGS  Left Ventricle: Mild septal hypokinesis. Left ventricular ejection fraction, by estimation, is 50 to 55%. The left ventricle has low normal function. The left ventricle has no regional wall motion abnormalities. The left ventricular internal cavity size  was normal in size. There is no left ventricular hypertrophy. Left ventricular diastolic parameters are consistent with Grade II diastolic dysfunction (pseudonormalization). Elevated left ventricular end-diastolic pressure. Right Ventricle: The right ventricular size is normal. No increase in right ventricular wall thickness. Right ventricular systolic function is normal. Left Atrium: Left atrial size was mildly dilated. Right Atrium: Right atrial size was normal in size. Pericardium: Trivial pericardial effusion is present. Mitral Valve: The mitral valve is normal in structure. There is moderate thickening of the mitral valve leaflet(s). Trivial mitral valve regurgitation. No evidence of mitral valve stenosis. Tricuspid Valve: The tricuspid valve is normal in structure. Tricuspid valve regurgitation is trivial. No evidence of tricuspid stenosis. Aortic Valve: The aortic valve is calcified. There is mild calcification of the aortic valve. There is mild thickening of the aortic valve. Aortic valve regurgitation is mild. Aortic regurgitation PHT measures 397 msec. No aortic stenosis is present. Pulmonic Valve: The pulmonic valve was normal in structure. Pulmonic valve regurgitation is not visualized. No evidence of pulmonic  stenosis. Aorta: The aortic root is normal in size and structure. Venous: The inferior vena cava is normal in size with greater than 50% respiratory variability, suggesting right atrial pressure of 3 mmHg. IAS/Shunts: No atrial level shunt detected by color flow Doppler.  LEFT VENTRICLE PLAX 2D LVIDd:         4.50 cm  Diastology LVIDs:         3.20 cm  LV e' medial:    4.79 cm/s LV PW:         1.50 cm  LV E/e' medial:  17.1 LV IVS:        1.20 cm  LV e' lateral:   7.18 cm/s LVOT diam:     2.30 cm  LV E/e' lateral: 11.4 LV SV:         57 LV SV Index:   30 LVOT Area:     4.15 cm  RIGHT VENTRICLE RV Basal diam:  2.40 cm RV S prime:     8.27 cm/s TAPSE (M-mode): 2.0 cm LEFT ATRIUM             Index       RIGHT ATRIUM           Index LA diam:        5.20 cm 2.74 cm/m  RA Area:     12.70 cm LA Vol (A2C):   60.4 ml 31.84 ml/m RA Volume:   24.20 ml  12.76 ml/m LA Vol (A4C):   58.4 ml 30.79 ml/m LA Biplane Vol: 59.9 ml 31.58 ml/m  AORTIC VALVE LVOT Vmax:   69.80 cm/s LVOT Vmean:  45.900 cm/s LVOT VTI:    0.137 m AI PHT:      397 msec  AORTA Ao Root diam: 2.90 cm MITRAL VALVE MV Area (PHT): 3.31 cm    SHUNTS MV Decel Time: 229 msec    Systemic VTI:  0.14 m MV E velocity: 81.80 cm/s  Systemic Diam: 2.30 cm MV A velocity: 78.00 cm/s MV E/A ratio:  1.05 Skeet Latch MD Electronically signed by Skeet Latch MD Signature Date/Time: 08/09/2020/2:47:19 PM    Final     Marzetta Board, MD, PhD [facility]  Between 7 am - 7 pm I am available, please contact me via Amion or [place]  Between 7 pm - 7 am I am not available, please contact night coverage MD/APP via Amion

## 2020-08-10 NOTE — Progress Notes (Signed)
Cardiology Progress Note  Patient ID: ARIANNA REYNEN MRN: [id number] DOB: 05-22-1936 Date of Encounter: 08/10/2020  Primary Cardiologist: Dorris Carnes, MD  Subjective   Chief Complaint: Shortness of breath  HPI: Continues to report shortness of breath.  Admitted with elevated BNP.  Chest x-ray was clear.  Appears to have some volume on her.  Troponin minimally elevated and flat.  Denies chest pain.  ROS:  All other ROS reviewed and negative. Pertinent positives noted in the HPI.     Inpatient Medications  Scheduled Meds: . carvedilol  25 mg Oral BID  . furosemide  40 mg Intravenous Once  . gabapentin  100 mg Oral Daily  . insulin aspart  0-9 Units Subcutaneous TID WC  . insulin detemir  7 Units Subcutaneous BID  . levothyroxine  200 mcg Oral QAC breakfast  . pantoprazole  40 mg Oral QHS  . rosuvastatin  20 mg Oral Daily  . sodium chloride flush  3 mL Intravenous Q12H  . Warfarin - Pharmacist Dosing Inpatient   Does not apply q1600   Continuous Infusions: . sodium chloride     PRN Meds: sodium chloride, ondansetron, oxyCODONE-acetaminophen, polyethylene glycol, sodium chloride flush   Vital Signs   Vitals:   08/10/20 0008 08/10/20 0008 08/10/20 0441 08/10/20 0752  BP:  117/64 (!) 113/52 (!) 121/58  Pulse: 80 (!) 115 72 69  Resp:  18 20 16   Temp:  98 F (36.7 C) 97.6 F (36.4 C) 98.2 F (36.8 C)  TempSrc:  Oral Oral Oral  SpO2:  94% 95% 96%  Weight:  83.3 kg    Height:        Intake/Output Summary (Last 24 hours) at 08/10/2020 1237 Last data filed at 08/10/2020 0900 Gross per 24 hour  Intake 360 ml  Output 275 ml  Net 85 ml   Last 3 Weights 08/10/2020 08/09/2020 08/09/2020  Weight (lbs) 183 lb 10.3 oz 182 lb 12.2 oz 186 lb  Weight (kg) 83.3 kg 82.9 kg 84.369 kg      Telemetry  Overnight telemetry shows sinus rhythm in the 80s, which I personally reviewed.   ECG  The most recent ECG shows sinus rhythm heart rate 74, nonspecific ST-T changes, left axis  deviation, which I personally reviewed.   Physical Exam   Vitals:   08/10/20 0008 08/10/20 0008 08/10/20 0441 08/10/20 0752  BP:  117/64 (!) 113/52 (!) 121/58  Pulse: 80 (!) 115 72 69  Resp:  18 20 16   Temp:  98 F (36.7 C) 97.6 F (36.4 C) 98.2 F (36.8 C)  TempSrc:  Oral Oral Oral  SpO2:  94% 95% 96%  Weight:  83.3 kg    Height:         Intake/Output Summary (Last 24 hours) at 08/10/2020 1237 Last data filed at 08/10/2020 0900 Gross per 24 hour  Intake 360 ml  Output 275 ml  Net 85 ml    Last 3 Weights 08/10/2020 08/09/2020 08/09/2020  Weight (lbs) 183 lb 10.3 oz 182 lb 12.2 oz 186 lb  Weight (kg) 83.3 kg 82.9 kg 84.369 kg    Body mass index is 31.52 kg/m.   General: Well nourished, well developed, in no acute distress Head: Atraumatic, normal size  Eyes: PEERLA, EOMI  Neck: Supple, JVD 7 8 cm of water Endocrine: No thryomegaly Cardiac: Normal S1, S2; RRR; no murmurs, rubs, or gallops Lungs: Diminished breath sounds at the lung bases Abd: Soft, nontender, no hepatomegaly  Ext: Trace edema Musculoskeletal:  No deformities, BUE and BLE strength normal and equal Skin: Warm and dry, no rashes   Neuro: Alert and oriented to person, place, time, and situation, CNII-XII grossly intact, no focal deficits  Psych: Normal mood and affect   Labs  High Sensitivity Troponin:   Recent Labs  Lab 08/09/20 0900 08/09/20 1013 08/09/20 1401  TROPONINIHS 124* 115* 93*     Cardiac EnzymesNo results for input(s): TROPONINI in the last 168 hours. No results for input(s): TROPIPOC in the last 168 hours.  Chemistry Recent Labs  Lab 08/09/20 0700 08/09/20 0900 08/10/20 0227  NA 137  --  136  K 3.8  --  3.5  CL 100  --  97*  CO2 25  --  25  GLUCOSE 400*  --  495*  BUN 30*  --  33*  CREATININE 1.22*  --  1.45*  CALCIUM 7.9*  --  8.0*  PROT  --  6.1* 6.2*  ALBUMIN  --  1.9* 1.8*  AST  --  61* 67*  ALT  --  25 30  ALKPHOS  --  125 145*  BILITOT  --  0.8 0.5  GFRNONAA 44*   --  36*  ANIONGAP 12  --  14    Hematology Recent Labs  Lab 08/09/20 0700 08/10/20 0227  WBC 9.9 11.7*  RBC 2.99* 3.00*  HGB 9.3* 8.9*  HCT 28.5* 28.0*  MCV 95.3 93.3  MCH 31.1 29.7  MCHC 32.6 31.8  RDW 18.2* 18.2*  PLT 153 191   BNP Recent Labs  Lab 08/09/20 0701  BNP 391.6*    DDimer No results for input(s): DDIMER in the last 168 hours.   Radiology  DG Chest Portable 1 View  Result Date: 08/09/2020 CLINICAL DATA:  Shortness of breath for 1 day EXAM: PORTABLE CHEST 1 VIEW COMPARISON:  04/30/2020 FINDINGS: Cardiomegaly. Prominent markings at the bases correlating with pleural opacity on the right and scarring on the left by chest CT 04/30/2020. No acute airspace disease, edema, effusion, or pneumothorax. Porta catheter on the right tip at the upper cavoatrial junction. IMPRESSION: No evidence of acute disease. Electronically Signed   By: Monte Fantasia M.D.   On: 08/09/2020 07:09   ECHOCARDIOGRAM COMPLETE  Result Date: 08/09/2020    ECHOCARDIOGRAM REPORT   Patient Name:   AULII MARTINIE Date of Exam: 08/09/2020 Medical Rec #:  [id number]       Height:       64.0 in Accession #:    [id number]      Weight:       186.0 lb Date of Birth:  06/11/1936      BSA:          1.897 m Patient Age:    84 years        BP:           109/54 mmHg Patient Gender: F               HR:           74 bpm. Exam Location:  Inpatient Procedure: 2D Echo, Cardiac Doppler and Color Doppler Indications:    Dyspnea  History:        Patient has prior history of Echocardiogram examinations, most                 recent 06/28/2017. Stroke, Arrythmias:SVT,                 Signs/Symptoms:Dyspnea; Risk Factors:Diabetes and Hypertension.  Elevated Trponin, chemo.  Sonographer:    Dustin Flock Referring Phys: [id number] [place]  1. Mild septal hypokinesis. Left ventricular ejection fraction, by estimation, is 50 to 55%. The left ventricle has low normal function. The left  ventricle has no regional wall motion abnormalities. Left ventricular diastolic parameters are consistent with Grade II diastolic dysfunction (pseudonormalization). Elevated left ventricular end-diastolic pressure.  2. Right ventricular systolic function is normal. The right ventricular size is normal.  3. Left atrial size was mildly dilated.  4. The mitral valve is normal in structure. Trivial mitral valve regurgitation. No evidence of mitral stenosis.  5. The aortic valve is calcified. There is mild calcification of the aortic valve. There is mild thickening of the aortic valve. Aortic valve regurgitation is mild. No aortic stenosis is present.  6. The inferior vena cava is normal in size with greater than 50% respiratory variability, suggesting right atrial pressure of 3 mmHg. FINDINGS  Left Ventricle: Mild septal hypokinesis. Left ventricular ejection fraction, by estimation, is 50 to 55%. The left ventricle has low normal function. The left ventricle has no regional wall motion abnormalities. The left ventricular internal cavity size  was normal in size. There is no left ventricular hypertrophy. Left ventricular diastolic parameters are consistent with Grade II diastolic dysfunction (pseudonormalization). Elevated left ventricular end-diastolic pressure. Right Ventricle: The right ventricular size is normal. No increase in right ventricular wall thickness. Right ventricular systolic function is normal. Left Atrium: Left atrial size was mildly dilated. Right Atrium: Right atrial size was normal in size. Pericardium: Trivial pericardial effusion is present. Mitral Valve: The mitral valve is normal in structure. There is moderate thickening of the mitral valve leaflet(s). Trivial mitral valve regurgitation. No evidence of mitral valve stenosis. Tricuspid Valve: The tricuspid valve is normal in structure. Tricuspid valve regurgitation is trivial. No evidence of tricuspid stenosis. Aortic Valve: The aortic valve is  calcified. There is mild calcification of the aortic valve. There is mild thickening of the aortic valve. Aortic valve regurgitation is mild. Aortic regurgitation PHT measures 397 msec. No aortic stenosis is present. Pulmonic Valve: The pulmonic valve was normal in structure. Pulmonic valve regurgitation is not visualized. No evidence of pulmonic stenosis. Aorta: The aortic root is normal in size and structure. Venous: The inferior vena cava is normal in size with greater than 50% respiratory variability, suggesting right atrial pressure of 3 mmHg. IAS/Shunts: No atrial level shunt detected by color flow Doppler.  LEFT VENTRICLE PLAX 2D LVIDd:         4.50 cm  Diastology LVIDs:         3.20 cm  LV e' medial:    4.79 cm/s LV PW:         1.50 cm  LV E/e' medial:  17.1 LV IVS:        1.20 cm  LV e' lateral:   7.18 cm/s LVOT diam:     2.30 cm  LV E/e' lateral: 11.4 LV SV:         57 LV SV Index:   30 LVOT Area:     4.15 cm  RIGHT VENTRICLE RV Basal diam:  2.40 cm RV S prime:     8.27 cm/s TAPSE (M-mode): 2.0 cm LEFT ATRIUM             Index       RIGHT ATRIUM           Index LA diam:  5.20 cm 2.74 cm/m  RA Area:     12.70 cm LA Vol (A2C):   60.4 ml 31.84 ml/m RA Volume:   24.20 ml  12.76 ml/m LA Vol (A4C):   58.4 ml 30.79 ml/m LA Biplane Vol: 59.9 ml 31.58 ml/m  AORTIC VALVE LVOT Vmax:   69.80 cm/s LVOT Vmean:  45.900 cm/s LVOT VTI:    0.137 m AI PHT:      397 msec  AORTA Ao Root diam: 2.90 cm MITRAL VALVE MV Area (PHT): 3.31 cm    SHUNTS MV Decel Time: 229 msec    Systemic VTI:  0.14 m MV E velocity: 81.80 cm/s  Systemic Diam: 2.30 cm MV A velocity: 78.00 cm/s MV E/A ratio:  1.05 Skeet Latch MD Electronically signed by Skeet Latch MD Signature Date/Time: 08/09/2020/2:47:19 PM    Final     Cardiac Studies  TTE 08/09/2020 1. Mild septal hypokinesis. Left ventricular ejection fraction, by  estimation, is 50 to 55%. The left ventricle has low normal function. The  left ventricle has no  regional wall motion abnormalities. Left ventricular  diastolic parameters are consistent  with Grade II diastolic dysfunction (pseudonormalization). Elevated left  ventricular end-diastolic pressure.  2. Right ventricular systolic function is normal. The right ventricular  size is normal.  3. Left atrial size was mildly dilated.  4. The mitral valve is normal in structure. Trivial mitral valve  regurgitation. No evidence of mitral stenosis.  5. The aortic valve is calcified. There is mild calcification of the  aortic valve. There is mild thickening of the aortic valve. Aortic valve  regurgitation is mild. No aortic stenosis is present.  6. The inferior vena cava is normal in size with greater than 50%  respiratory variability, suggesting right atrial pressure of 3 mmHg.   Patient Profile  KRISTINA MCNORTON is a 84 y.o. female with hypertension, diabetes, stroke, DVT, ampullary pancreatic cancer who was admitted on 08/09/2020 with shortness of breath.  Assessment & Plan   1.  Shortness of breath/diastolic heart failure -BNP elevated to 391 -Chest x-ray clear -EF 50-55% with grade 2 diastolic dysfunction -She is not grossly volume overloaded but does have elevated JVD.  Her IVC was collapsing on her echo.  She does have elevated LVEDP. -We will continue with Lasix 40 mg twice daily today.  Hopefully she will improve.  2.  Elevated troponin/demand ischemia in the setting of diastolic heart failure -No chest pain.  She is bedridden.  She reports for the last 2 weeks she not tolerate chemo well.  Given her metastatic cancer I am reluctant to do aggressive medical care.   -Stress test in 2017 with normal perfusion and likely diaphragm attenuation -Echo shows normal wall motion without wall motion normalities -She denies chest pain.  Her EKG is without acute ischemic changes. -Overall I suspect this is just demand ischemia in the setting of diastolic heart failure. -Given her poor  functional status and kidney disease I think it would be best to treat her medically. -Her INR supratherapeutic.  We would treat her with heparin for 48 hours but I see no need for this given her INR which is elevated.  Would hold aspirin given elevated INR as well. -Continue statin.  For questions or updates, please contact [place] Please consult [email or web address] for contact info under   Time Spent with Patient: I have spent a total of 25 minutes with patient reviewing hospital notes, telemetry, EKGs, labs and examining the patient as well  as establishing an assessment and plan that was discussed with the patient.  > 50% of time was spent in direct patient care.    Signed, Addison Naegeli. Audie Box, [place]  08/10/2020 12:37 PM

## 2020-08-10 NOTE — Care Management Obs Status (Signed)
[facility] NOTIFICATION   Patient Details  Name: Kaylee Mullins MRN: [id number] Date of Birth: 01-26-1936   Medicare Observation Status Notification Given:  Yes    Carles Collet, RN 08/10/2020, 12:37 PM

## 2020-08-10 NOTE — Progress Notes (Signed)
   08/10/20 1950  Assess: MEWS Score  Temp 98.6 F (37 C)  BP 115/70  Pulse Rate (!) 111  Resp 17  Assess: MEWS Score  MEWS Temp 0  MEWS Systolic 0  MEWS Pulse 2  MEWS RR 0  MEWS LOC 0  MEWS Score 2  MEWS Score Color Yellow  Assess: if the MEWS score is Yellow or Red  Were vital signs taken at a resting state? Yes  Focused Assessment No change from prior assessment  Early Detection of Sepsis Score *See Row Information* Medium  MEWS guidelines implemented *See Row Information* Yes  Treat  MEWS Interventions Escalated (See documentation below);Other (Comment)  Pain Scale 0-10  Pain Score 0  Take Vital Signs  Increase Vital Sign Frequency  Yellow: Q 2hr X 2 then Q 4hr X 2, if remains yellow, continue Q 4hrs  Escalate  MEWS: Escalate Yellow: discuss with charge nurse/RN and consider discussing with provider and RRT  Notify: Charge Nurse/RN  Name of Charge Nurse/RN Notified [place] RN  Date Charge Nurse/RN Notified 08/10/20  Time Charge Nurse/RN Notified 2035  Notify: Provider  Provider Name/Title Zierle-Ghosh DO  Date Provider Notified 08/10/20  Time Provider Notified 2045  Notification Type Page  Notification Reason Change in status  Response See new orders  Date of Provider Response 08/10/20  Time of Provider Response 2050  Document  Patient Outcome Other (Comment)  Progress note created (see row info) Yes

## 2020-08-10 NOTE — Progress Notes (Signed)
ANTICOAGULATION CONSULT NOTE - Initial Consult  Pharmacy Consult for warfarin Indication: VTE treatment  Allergies  Allergen Reactions  . Hydralazine Hcl Shortness Of Breath    Pt had severe respiratory distress after receiving a dose  . Amlodipine Besylate Swelling  . Darvon [Propoxyphene Hcl] Other (See Comments)    Hallucinations   . Nyquil Multi-Symptom [Pseudoeph-Doxylamine-Dm-Apap] Other (See Comments)    Makes pt not "feel right in her head"  . Metformin And Related Diarrhea    Patient Measurements: Height: 5\' 4"  (162.6 cm) Weight: 83.3 kg (183 lb 10.3 oz) IBW/kg (Calculated) : 54.7  Vital Signs: Temp: 98.2 F (36.8 C) (12/25 0752) Temp Source: Oral (12/25 0752) BP: 121/58 (12/25 0752) Pulse Rate: 69 (12/25 0752)  Labs: Recent Labs    08/09/20 0700 08/09/20 0900 08/09/20 1013 08/09/20 1401 08/10/20 0227  HGB 9.3*  --   --   --  8.9*  HCT 28.5*  --   --   --  28.0*  PLT 153  --   --   --  191  LABPROT  --  35.6*  --   --  42.4*  INR  --  3.7*  --   --  4.6*  CREATININE 1.22*  --   --   --  1.45*  TROPONINIHS  --  124* 115* 93*  --     Estimated Creatinine Clearance: 31.2 mL/min (A) (by C-G formula based on SCr of 1.45 mg/dL (H)).   Medical History: Past Medical History:  Diagnosis Date  . Acute CVA (cerebrovascular accident) ([place]) 09/16/2014  . Acute DVT of left tibial vein ([place]) 04/03/2017  . Adenocarcinoma ([place]) 01/12/2017  . 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 Anxiety   . Arthritis    knees  . Black tarry stools    05-14-16 negative for occult blood with ER visit- noted in [place].  . Cancer [facility])    thyroid cancer- surgery and radiation  . Cerebral infarction due to unspecified mechanism   . Chronic kidney disease    questionable mass on kidney. Being followed by Dr Diona Fanti  . Complication of anesthesia    heart rate was really low  . CVA (cerebral infarction) 09/11/2014  . Depression   . Diabetes mellitus    type 2  . Diabetes mellitus  without complication ([place]) [id number]   Qualifier: Diagnosis of  By: Loanne Drilling MD, Jacelyn Pi   . Dyslipidemia 04/20/2007   Qualifier: Diagnosis of  By: Loanne Drilling MD, Jacelyn Pi   . Full dentures   . Gastroparesis 02/06/2017  . GERD (gastroesophageal reflux disease) 04/03/2017  . History of CVA (cerebral vascular accident) ([place]) 09/11/2014  . Hypertension   . Hypokalemia 08/25/2017  . Hypothyroidism   . Lumbar stenosis with neurogenic claudication 02/10/2016  . Osteoarthritis 09/11/2014  . Pneumonia   . Right renal mass 09/13/2014  . Spinal stenosis   . Stroke ([place]) 09/2014   left sided weakness  . SVT (supraventricular tachycardia) ([place]) 07/30/2017    Medications:  Medications Prior to Admission  Medication Sig Dispense Refill Last Dose  . acetaminophen (TYLENOL) 500 MG tablet Take 500 mg by mouth every 6 (six) hours as needed for mild pain.   unk  . carvedilol (COREG) 25 MG tablet Take 1 tablet (25 mg total) by mouth 2 (two) times daily. 180 tablet 3 08/08/2020 at 1800  . furosemide (LASIX) 80 MG tablet TAKE 1 TABLET BY MOUTH EVERY DAY ALTERNATING WITH 40MG  90 tablet 3 08/08/2020 at Unknown time  .  gabapentin (NEURONTIN) 100 MG capsule Take 100 mg by mouth daily.   08/08/2020 at Unknown time  . HUMALOG KWIKPEN 100 UNIT/ML KwikPen Inject 7-10 Units into the skin See admin instructions. Three times daily per sliding scale   08/07/2020  . insulin detemir (LEVEMIR) 100 UNIT/ML injection Inject 0.07 mLs (7 Units total) into the skin 2 (two) times daily. (Patient taking differently: Inject 40 Units into the skin daily.) 10 mL 0 08/08/2020 at Unknown time  . levothyroxine (SYNTHROID, LEVOTHROID) 200 MCG tablet Take 200 mcg by mouth daily before breakfast. For hypothyroidism  99 08/08/2020 at Unknown time  . lidocaine-prilocaine (EMLA) cream Apply to port site 1-2 hours prior to use 30 g 3 08/02/2020  . NIFEdipine (PROCARDIA-XL/ADALAT-CC/NIFEDICAL-XL) 30 MG 24 hr tablet Take 30 mg by mouth daily.  2 08/08/2020 at  Unknown time  . ondansetron (ZOFRAN) 8 MG tablet Take 1 tablet (8 mg total) by mouth every 8 (eight) hours as needed for nausea or vomiting. 20 tablet 0 unk  . oxyCODONE-acetaminophen (PERCOCET/ROXICET) 5-325 MG tablet Take 1-2 tablets by mouth every 8 (eight) hours as needed for severe pain. 40 tablet 0 08/08/2020 at Unknown time  . pantoprazole (PROTONIX) 40 MG tablet Take 1 tablet (40 mg total) by mouth at bedtime. 30 tablet 0 08/08/2020 at Unknown time  . Polyethylene Glycol 3350 (MIRALAX PO) Take 17 g by mouth daily as needed (constipation).   unk  . potassium chloride SA (KLOR-CON M20) 20 MEQ tablet Take 3 tablets daily alternating with 1 tablet daily.  Take with furosemide. 270 tablet 3 08/08/2020 at Unknown time  . rosuvastatin (CRESTOR) 20 MG tablet Take 20 mg by mouth daily.    08/08/2020 at Unknown time  . Vitamin D, Ergocalciferol, (DRISDOL) 1.25 MG (50000 UNIT) CAPS capsule TAKE 1 CAPSULE BY MOUTH ONE TIME PER WEEK (Patient taking differently: Take 50,000 Units by mouth every Monday.) 5 capsule 5 08/05/2020  . warfarin (COUMADIN) 7.5 MG tablet Take 1 tablet (7.5 mg total) by mouth one time only at 6 PM. 30 tablet 0 08/08/2020 at 1900    Assessment: 84 yo female who presented with SOB. PTA the patient is on warfarin at home for hx of DVT. Of note, the patient has a history of rectus sheath hematoma while on enoxaparin and had and IVC filter placed.  INR upon admission was supratherapeutic at 3.7. The last reported dose of warfarin was on 12/23 @ 1900. Pharmacy is consulted to dose warfarin.     - PTA warfarin dose: : 7.5 mg PO daily   INR this morning is supratherapeutic at 4.6. CBC is low but stable. The patient is without bleeding.   Goal of Therapy:  INR 2-3 Monitor platelets by anticoagulation protocol: Yes   Plan:  - HOLD warfarin today - Obtain daily PT/INR and CBC - Monitor closely for signs and symptoms of bleeding    Shauna Hugh, PharmD, [place]  PGY-1 Pharmacy  Resident 08/10/2020 11:38 AM  Please check [email or web address] for unit-specific pharmacy phone numbers.

## 2020-08-11 ENCOUNTER — Other Ambulatory Visit (HOSPITAL_COMMUNITY): Payer: Medicare Other

## 2020-08-11 ENCOUNTER — Other Ambulatory Visit: Payer: Self-pay | Admitting: Oncology

## 2020-08-11 DIAGNOSIS — R7989 Other specified abnormal findings of blood chemistry: Secondary | ICD-10-CM | POA: Diagnosis not present

## 2020-08-11 DIAGNOSIS — I5033 Acute on chronic diastolic (congestive) heart failure: Secondary | ICD-10-CM | POA: Diagnosis not present

## 2020-08-11 DIAGNOSIS — R0602 Shortness of breath: Secondary | ICD-10-CM | POA: Diagnosis not present

## 2020-08-11 DIAGNOSIS — I5031 Acute diastolic (congestive) heart failure: Secondary | ICD-10-CM | POA: Diagnosis not present

## 2020-08-11 DIAGNOSIS — R778 Other specified abnormalities of plasma proteins: Secondary | ICD-10-CM | POA: Diagnosis not present

## 2020-08-11 LAB — COMPREHENSIVE METABOLIC PANEL
ALT: 57 U/L — ABNORMAL HIGH (ref 0–44)
AST: 199 U/L — ABNORMAL HIGH (ref 15–41)
Albumin: 1.6 g/dL — ABNORMAL LOW (ref 3.5–5.0)
Alkaline Phosphatase: 176 U/L — ABNORMAL HIGH (ref 38–126)
Anion gap: 11 (ref 5–15)
BUN: 32 mg/dL — ABNORMAL HIGH (ref 8–23)
CO2: 30 mmol/L (ref 22–32)
Calcium: 7.8 mg/dL — ABNORMAL LOW (ref 8.9–10.3)
Chloride: 99 mmol/L (ref 98–111)
Creatinine, Ser: 1.82 mg/dL — ABNORMAL HIGH (ref 0.44–1.00)
GFR, Estimated: 27 mL/min — ABNORMAL LOW (ref >60)
Glucose, Bld: 175 mg/dL — ABNORMAL HIGH (ref 70–99)
Potassium: 3.2 mmol/L — ABNORMAL LOW (ref 3.5–5.1)
Sodium: 140 mmol/L (ref 135–145)
Total Bilirubin: 0.8 mg/dL (ref 0.3–1.2)
Total Protein: 6.1 g/dL — ABNORMAL LOW (ref 6.5–8.1)

## 2020-08-11 LAB — CBC
HCT: 29.1 % — ABNORMAL LOW (ref 36.0–46.0)
Hemoglobin: 9.4 g/dL — ABNORMAL LOW (ref 12.0–15.0)
MCH: 30 pg (ref 26.0–34.0)
MCHC: 32.3 g/dL (ref 30.0–36.0)
MCV: 93 fL (ref 80.0–100.0)
Platelets: 221 10*3/uL (ref 150–400)
RBC: 3.13 MIL/uL — ABNORMAL LOW (ref 3.87–5.11)
RDW: 18.3 % — ABNORMAL HIGH (ref 11.5–15.5)
WBC: 13 10*3/uL — ABNORMAL HIGH (ref 4.0–10.5)
nRBC: 0.5 % — ABNORMAL HIGH (ref 0.0–0.2)

## 2020-08-11 LAB — GLUCOSE, CAPILLARY
Glucose-Capillary: 155 mg/dL — ABNORMAL HIGH (ref 70–99)
Glucose-Capillary: 183 mg/dL — ABNORMAL HIGH (ref 70–99)
Glucose-Capillary: 139 mg/dL — ABNORMAL HIGH (ref 70–99)

## 2020-08-11 LAB — PROTIME-INR
INR: 3.9 — ABNORMAL HIGH (ref 0.8–1.2)
Prothrombin Time: 37.2 seconds — ABNORMAL HIGH (ref 11.4–15.2)

## 2020-08-11 MED ORDER — CHLORHEXIDINE GLUCONATE CLOTH 2 % EX PADS
6.0000 | MEDICATED_PAD | Freq: Every day | CUTANEOUS | Status: DC
  Administered 2020-08-13 – 2020-08-20 (×8): 6 via TOPICAL

## 2020-08-11 MED ORDER — ADULT MULTIVITAMIN W/MINERALS CH
1.0000 | ORAL_TABLET | Freq: Every day | ORAL | Status: DC
  Administered 2020-08-12 – 2020-08-20 (×8): 1 via ORAL
  Filled 2020-08-11 (×9): qty 1

## 2020-08-11 MED ORDER — GLUCERNA SHAKE PO LIQD
237.0000 mL | Freq: Three times a day (TID) | ORAL | Status: DC
  Administered 2020-08-11 – 2020-08-20 (×18): 237 mL via ORAL

## 2020-08-11 NOTE — TOC Initial Note (Signed)
Transition of Care [facility]) - Initial/Assessment Note    Patient Details  Name: Kaylee Mullins MRN: [id number] Date of Birth: 1936/01/08  [facility]) CM/SW Contact:    Bary Castilla, LCSW Phone Number: [phone] 08/11/2020, 12:49 PM  Clinical Narrative:                 CSW met with patient to discuss PT and pt's daughter Freda Munro recommendation of a SNF. Patient was aware of recommendation and in agreement with going to a ST SNF. CSW discussed the SNF process.CSW provided patient with medicare.gov rating list.  Patient gave CSW permission to fax referrals out to local facilities.CSW answered questions about the SNF process and the next steps in the process.  [facility]  TOC team will continue to assist with discharge planning needs.    Expected Discharge Plan: Skilled Nursing Facility Barriers to Discharge: Continued Medical Work up,SNF Pending bed offer   Patient Goals and CMS Choice Patient states their goals for this hospitalization and ongoing recovery are:: To be able to go home CMS Medicare.gov Compare Post Acute Care list provided to:: Patient Choice offered to / list presented to : Patient  Expected Discharge Plan and Services Expected Discharge Plan: [place]       Living arrangements for the past 2 months: Single Family Home                                      Prior Living Arrangements/Services Living arrangements for the past 2 months: Single Family Home Lives with:: Self Patient language and need for interpreter reviewed:: Yes Do you feel safe going back to the place where you live?: Yes        Care giver support system in place?: Yes (comment)      Activities of Daily Living Home Assistive Devices/Equipment: Eyeglasses,Shower chair with back,Walker (specify type) ADL Screening (condition at time of admission) Patient's cognitive ability adequate to safely complete daily  activities?: Yes Is the patient deaf or have difficulty hearing?: Yes Does the patient have difficulty seeing, even when wearing glasses/contacts?: Yes Does the patient have difficulty concentrating, remembering, or making decisions?: No Patient able to express need for assistance with ADLs?: Yes Does the patient have difficulty dressing or bathing?: Yes Independently performs ADLs?: No Communication: Independent Dressing (OT): Needs assistance Is this a change from baseline?: Change from baseline, expected to last <3days Grooming: Independent Feeding: Independent Bathing: Independent with device (comment) (shower chair) Toileting: Needs assistance Is this a change from baseline?: Change from baseline, expected to last <3 days In/Out Bed: Needs assistance Is this a change from baseline?: Change from baseline, expected to last <3 days Walks in Home: Independent with device (comment) (walker) Does the patient have difficulty walking or climbing stairs?: Yes Weakness of Legs: Left Weakness of Arms/Hands: Left  Permission Sought/Granted Permission sought to share information with : Family Supports Permission granted to share information with : Yes, Verbal Permission Granted  Share Information with NAME: Freda Munro  Permission granted to share info w AGENCY: SNFs  Permission granted to share info w Relationship: Daughter  Permission granted to share info w Contact Information: [phone]  Emotional Assessment Appearance:: Appears stated age Attitude/Demeanor/Rapport: Engaged Affect (typically observed): Accepting,Adaptable Orientation: : Oriented to Self,Oriented to Place,Oriented to  Time,Oriented to Situation      Admission diagnosis:  Shortness of breath [R06.02] Dehydration [E86.0] Supratherapeutic INR [R79.1] Acute on chronic diastolic CHF (congestive heart failure) ([place]) [I50.33] Patient Active Problem List   Diagnosis Date Noted   Acute on chronic diastolic CHF  (congestive heart failure) ([place]) 08/10/2020   SOB (shortness of breath) 08/09/2020   Elevated troponin 08/09/2020   Shortness of breath 08/09/2020   Port-A-Cath in place 09/14/2019   Goals of care, counseling/discussion 08/23/2019   Traumatic hematoma of right lower leg 04/13/2019   Cellulitis 04/12/2019   Hypokalemia 08/25/2017   Spinal stenosis    Pneumonia    Full dentures    Depression    Complication of anesthesia    Chronic kidney disease    Cancer ([place])    Black tarry stools    Arthritis    Anxiety    Hypertension 08/03/2017   Paroxysmal SVT (supraventricular tachycardia) ([place]) 08/03/2017   History of CVA (cerebrovascular accident) 08/03/2017   Hypothyroidism, adult 08/03/2017   Chronic diastolic heart failure ([place]) 08/03/2017   History of cancer of ampulla of duodenum 08/03/2017   Left flank pain 08/03/2017   Diabetes mellitus type 2, uncontrolled ([place])    CKD stage 3 due to type 2 diabetes mellitus (HCC)    SVT (supraventricular tachycardia) ([place]) 07/30/2017   Edema extremities 07/30/2017   DVT, lower extremity, recurrent, bilateral ([place]) 07/30/2017   On total parenteral nutrition (TPN) 04/08/2017   Hematoma of rectus sheath 04/03/2017   GERD (gastroesophageal reflux disease) 04/03/2017   Malnutrition of moderate degree 02/07/2017   Gastroparesis 02/06/2017   Adenocarcinoma ([place]) 01/12/2017   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) 09/16/2014   Right renal mass 09/13/2014   Cerebral infarction due to unspecified mechanism    CVA (cerebral infarction) 09/11/2014   Osteoarthritis 09/11/2014   Postsurgical  hypothyroidism 09/11/2014   History of CVA (cerebral vascular accident) ([place]) 09/11/2014   Diabetes mellitus without complication ([place]) 88/73/7308   Dyslipidemia 04/20/2007   PCP:  Nolene Ebbs, MD Pharmacy:   [id number] [place], [id number]: [phone]: [phone]    Social Determinants of Health (SDOH) Interventions    Readmission Risk Interventions No flowsheet data found.

## 2020-08-11 NOTE — Progress Notes (Signed)
Cardiology Progress Note  Patient ID: Kaylee Mullins MRN: [id number] DOB: October 24, 1935 Date of Encounter: 08/11/2020  Primary Cardiologist: Dorris Carnes, MD  Subjective   Chief Complaint: Weakness and fatigue  HPI: Reports her breathing is slightly better.  Reports she feels weak and fatigued.  Was given Lasix with minimal urine output.  Creatinine elevated.  Lasix was held.  ROS:  All other ROS reviewed and negative. Pertinent positives noted in the HPI.     Inpatient Medications  Scheduled Meds: . carvedilol  25 mg Oral BID  . gabapentin  100 mg Oral Daily  . insulin aspart  0-9 Units Subcutaneous TID WC  . insulin detemir  7 Units Subcutaneous BID  . levothyroxine  200 mcg Oral QAC breakfast  . pantoprazole  40 mg Oral QHS  . sodium chloride flush  3 mL Intravenous Q12H  . Warfarin - Pharmacist Dosing Inpatient   Does not apply q1600   Continuous Infusions: . sodium chloride     PRN Meds: sodium chloride, ondansetron, oxyCODONE-acetaminophen, polyethylene glycol, sodium chloride flush   Vital Signs   Vitals:   08/10/20 2208 08/11/20 0020 08/11/20 0511 08/11/20 0826  BP: 118/60 (!) 111/56 (!) 114/50 108/71  Pulse: 72 70 63 67  Resp: 17 17 17 20   Temp: 98.2 F (36.8 C) 98.7 F (37.1 C) 98.1 F (36.7 C) 98.7 F (37.1 C)  TempSrc: Oral Oral Oral Oral  SpO2: 93% 94% 100% 100%  Weight:   84.1 kg   Height:        Intake/Output Summary (Last 24 hours) at 08/11/2020 1027 Last data filed at 08/11/2020 0500 Gross per 24 hour  Intake 843 ml  Output 751 ml  Net 92 ml   Last 3 Weights 08/11/2020 08/10/2020 08/09/2020  Weight (lbs) 185 lb 6.5 oz 183 lb 10.3 oz 182 lb 12.2 oz  Weight (kg) 84.1 kg 83.3 kg 82.9 kg      Telemetry  Overnight telemetry shows sinus rhythm, which I personally reviewed.   ECG  The most recent ECG shows sinus rhythm heart rate 79, brief PACs noted, which I personally reviewed.   Physical Exam   Vitals:   08/10/20 2208 08/11/20 0020  08/11/20 0511 08/11/20 0826  BP: 118/60 (!) 111/56 (!) 114/50 108/71  Pulse: 72 70 63 67  Resp: 17 17 17 20   Temp: 98.2 F (36.8 C) 98.7 F (37.1 C) 98.1 F (36.7 C) 98.7 F (37.1 C)  TempSrc: Oral Oral Oral Oral  SpO2: 93% 94% 100% 100%  Weight:   84.1 kg   Height:         Intake/Output Summary (Last 24 hours) at 08/11/2020 1027 Last data filed at 08/11/2020 0500 Gross per 24 hour  Intake 843 ml  Output 751 ml  Net 92 ml    Last 3 Weights 08/11/2020 08/10/2020 08/09/2020  Weight (lbs) 185 lb 6.5 oz 183 lb 10.3 oz 182 lb 12.2 oz  Weight (kg) 84.1 kg 83.3 kg 82.9 kg    Body mass index is 31.83 kg/m.  General: Well nourished, well developed, in no acute distress Head: Atraumatic, normal size  Eyes: PEERLA, EOMI  Neck: Supple, no JVD Endocrine: No thryomegaly Cardiac: Normal S1, S2; RRR; no murmurs, rubs, or gallops Lungs: Clear to auscultation bilaterally, no wheezing, rhonchi or rales  Abd: Soft, nontender, no hepatomegaly  Ext: No edema, pulses 2+ Musculoskeletal: No deformities, BUE and BLE strength normal and equal Skin: Warm and dry, no rashes   Neuro: Alert and  oriented to person, place, time, and situation, CNII-XII grossly intact, no focal deficits  Psych: Normal mood and affect   Labs  High Sensitivity Troponin:   Recent Labs  Lab 08/09/20 0900 08/09/20 1013 08/09/20 1401  TROPONINIHS 124* 115* 93*     Cardiac EnzymesNo results for input(s): TROPONINI in the last 168 hours. No results for input(s): TROPIPOC in the last 168 hours.  Chemistry Recent Labs  Lab 08/09/20 0700 08/09/20 0900 08/10/20 0227 08/11/20 0421  NA 137  --  136 140  K 3.8  --  3.5 3.2*  CL 100  --  97* 99  CO2 25  --  25 30  GLUCOSE 400*  --  495* 175*  BUN 30*  --  33* 32*  CREATININE 1.22*  --  1.45* 1.82*  CALCIUM 7.9*  --  8.0* 7.8*  PROT  --  6.1* 6.2* 6.1*  ALBUMIN  --  1.9* 1.8* 1.6*  AST  --  61* 67* 199*  ALT  --  25 30 57*  ALKPHOS  --  125 145* 176*  BILITOT   --  0.8 0.5 0.8  GFRNONAA 44*  --  36* 27*  ANIONGAP 12  --  14 11    Hematology Recent Labs  Lab 08/09/20 0700 08/10/20 0227 08/11/20 0421  WBC 9.9 11.7* 13.0*  RBC 2.99* 3.00* 3.13*  HGB 9.3* 8.9* 9.4*  HCT 28.5* 28.0* 29.1*  MCV 95.3 93.3 93.0  MCH 31.1 29.7 30.0  MCHC 32.6 31.8 32.3  RDW 18.2* 18.2* 18.3*  PLT 153 191 221   BNP Recent Labs  Lab 08/09/20 0701  BNP 391.6*    DDimer No results for input(s): DDIMER in the last 168 hours.   Radiology  ECHOCARDIOGRAM COMPLETE  Result Date: 08/09/2020    ECHOCARDIOGRAM REPORT   Patient Name:   Kaylee Mullins Date of Exam: 08/09/2020 Medical Rec #:  [id number]       Height:       64.0 in Accession #:    [id number]      Weight:       186.0 lb Date of Birth:  February 02, 1936      BSA:          1.897 m Patient Age:    84 years        BP:           109/54 mmHg Patient Gender: F               HR:           74 bpm. Exam Location:  Inpatient Procedure: 2D Echo, Cardiac Doppler and Color Doppler Indications:    Dyspnea  History:        Patient has prior history of Echocardiogram examinations, most                 recent 06/28/2017. Stroke, Arrythmias:SVT,                 Signs/Symptoms:Dyspnea; Risk Factors:Diabetes and Hypertension.                 Elevated Trponin, chemo.  Sonographer:    Lavenia Atlas Referring Phys: [id number] Dorothe Pea BRANCH IMPRESSIONS  1. Mild septal hypokinesis. Left ventricular ejection fraction, by estimation, is 50 to 55%. The left ventricle has low normal function. The left ventricle has no regional wall motion abnormalities. Left ventricular diastolic parameters are consistent with Grade II diastolic dysfunction (pseudonormalization). Elevated left ventricular end-diastolic pressure.  2. Right ventricular systolic function is normal. The right ventricular size is normal.  3. Left atrial size was mildly dilated.  4. The mitral valve is normal in structure. Trivial mitral valve regurgitation. No evidence of mitral  stenosis.  5. The aortic valve is calcified. There is mild calcification of the aortic valve. There is mild thickening of the aortic valve. Aortic valve regurgitation is mild. No aortic stenosis is present.  6. The inferior vena cava is normal in size with greater than 50% respiratory variability, suggesting right atrial pressure of 3 mmHg. FINDINGS  Left Ventricle: Mild septal hypokinesis. Left ventricular ejection fraction, by estimation, is 50 to 55%. The left ventricle has low normal function. The left ventricle has no regional wall motion abnormalities. The left ventricular internal cavity size  was normal in size. There is no left ventricular hypertrophy. Left ventricular diastolic parameters are consistent with Grade II diastolic dysfunction (pseudonormalization). Elevated left ventricular end-diastolic pressure. Right Ventricle: The right ventricular size is normal. No increase in right ventricular wall thickness. Right ventricular systolic function is normal. Left Atrium: Left atrial size was mildly dilated. Right Atrium: Right atrial size was normal in size. Pericardium: Trivial pericardial effusion is present. Mitral Valve: The mitral valve is normal in structure. There is moderate thickening of the mitral valve leaflet(s). Trivial mitral valve regurgitation. No evidence of mitral valve stenosis. Tricuspid Valve: The tricuspid valve is normal in structure. Tricuspid valve regurgitation is trivial. No evidence of tricuspid stenosis. Aortic Valve: The aortic valve is calcified. There is mild calcification of the aortic valve. There is mild thickening of the aortic valve. Aortic valve regurgitation is mild. Aortic regurgitation PHT measures 397 msec. No aortic stenosis is present. Pulmonic Valve: The pulmonic valve was normal in structure. Pulmonic valve regurgitation is not visualized. No evidence of pulmonic stenosis. Aorta: The aortic root is normal in size and structure. Venous: The inferior vena cava is  normal in size with greater than 50% respiratory variability, suggesting right atrial pressure of 3 mmHg. IAS/Shunts: No atrial level shunt detected by color flow Doppler.  LEFT VENTRICLE PLAX 2D LVIDd:         4.50 cm  Diastology LVIDs:         3.20 cm  LV e' medial:    4.79 cm/s LV PW:         1.50 cm  LV E/e' medial:  17.1 LV IVS:        1.20 cm  LV e' lateral:   7.18 cm/s LVOT diam:     2.30 cm  LV E/e' lateral: 11.4 LV SV:         57 LV SV Index:   30 LVOT Area:     4.15 cm  RIGHT VENTRICLE RV Basal diam:  2.40 cm RV S prime:     8.27 cm/s TAPSE (M-mode): 2.0 cm LEFT ATRIUM             Index       RIGHT ATRIUM           Index LA diam:        5.20 cm 2.74 cm/m  RA Area:     12.70 cm LA Vol (A2C):   60.4 ml 31.84 ml/m RA Volume:   24.20 ml  12.76 ml/m LA Vol (A4C):   58.4 ml 30.79 ml/m LA Biplane Vol: 59.9 ml 31.58 ml/m  AORTIC VALVE LVOT Vmax:   69.80 cm/s LVOT Vmean:  45.900 cm/s LVOT VTI:  0.137 m AI PHT:      397 msec  AORTA Ao Root diam: 2.90 cm MITRAL VALVE MV Area (PHT): 3.31 cm    SHUNTS MV Decel Time: 229 msec    Systemic VTI:  0.14 m MV E velocity: 81.80 cm/s  Systemic Diam: 2.30 cm MV A velocity: 78.00 cm/s MV E/A ratio:  1.05 Skeet Latch MD Electronically signed by Skeet Latch MD Signature Date/Time: 08/09/2020/2:47:19 PM    Final     Cardiac Studies  TTE 08/09/2020 1. Mild septal hypokinesis. Left ventricular ejection fraction, by  estimation, is 50 to 55%. The left ventricle has low normal function. The  left ventricle has no regional wall motion abnormalities. Left ventricular  diastolic parameters are consistent  with Grade II diastolic dysfunction (pseudonormalization). Elevated left  ventricular end-diastolic pressure.  2. Right ventricular systolic function is normal. The right ventricular  size is normal.  3. Left atrial size was mildly dilated.  4. The mitral valve is normal in structure. Trivial mitral valve  regurgitation. No evidence of mitral  stenosis.  5. The aortic valve is calcified. There is mild calcification of the  aortic valve. There is mild thickening of the aortic valve. Aortic valve  regurgitation is mild. No aortic stenosis is present.  6. The inferior vena cava is normal in size with greater than 50%  respiratory variability, suggesting right atrial pressure of 3 mmHg.   Patient Profile  ANDRE LICHT is a 84 y.o. female with diabetes, hypertension, stroke, ampullary pancreatic cancer who was admitted on 08/09/2020 with acute onset shortness of breath.  Assessment & Plan   1.  Shortness of breath/elevated BNP -Chest x-ray clear.  EF 50-55% with grade 2 diastolic dysfunction.  IVC was collapsing on echo.  She was given 40 mg of IV Lasix  without much urine output.  Seems to have made no improvement.  I do not think she is in congestive heart failure to explain her symptoms. -Overall she appears weak and fatigued in the setting of being on chemotherapy.  This is a more likely explanation for her symptoms. -Would hold further diuresis. -Likely would benefit from small dose of p.o. Lasix at discharge.  2.  Elevated troponin/demand ischemia in the setting of diastolic heart failure versus dehydration -Minimally elevated and flat. BNP elevated as above. No chest pain.  EKG without ischemic changes.  I do not think this represents an acute coronary syndrome.  Furthermore she has advanced cancer and is quite debilitated.  She reports for the past week she has been bedridden. -She was also admitted with supratherapeutic INR. -Nuclear medicine stress test in 2017 was normal with diaphragm attenuation. -She does have significant CKD. -Overall I favor medical management.  Heparin was held due to elevated INR.  Would start aspirin once her Coumadin level is not supratherapeutic. -Continue beta-blocker -She is now with liver injury.  Would recommend to hold statin.  This can be revisited if her liver function improves. -I do  agree with further evaluation with right upper quadrant ultrasound of her liver enzymes. -Overall her picture is more fitting with weakness and fatigue related to chemotherapy use.  She may in fact be dehydrated given minimal response to Lasix and no real improvement in symptoms.  Would recommend further work-up per hospital medicine.  For questions or updates, please contact [place] Please consult [email or web address] for contact info under   Time Spent with Patient: I have spent a total of 15 minutes with patient reviewing hospital  notes, telemetry, EKGs, labs and examining the patient as well as establishing an assessment and plan that was discussed with the patient.  > 50% of time was spent in direct patient care.    Signed, Addison Naegeli. Audie Box, [place]  08/11/2020 10:27 AM

## 2020-08-11 NOTE — Progress Notes (Addendum)
PROGRESS NOTE  Kaylee Mullins VZD:[id number] DOB: 12-03-1935 DOA: 08/09/2020 PCP: Nolene Ebbs, MD   LOS: 1 day   Brief Narrative / Interim history: 84 y.o. 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  Subjective / 24h Interval events: States that her breathing is better.  Very mildly confused this morning  Assessment & Plan: Principal Problem Dyspnea due to acute on chronic respiratory failure -She also has an elevated BNP as well as elevated troponin, likely demand ischemia, cardiology consulted and for now recommending ongoing diuresis with Lasix as she is persistently symptomatic.  In and outs probably not accurate, her weight however has gone up despite diuresis -Patient's creatinine has increased significantly this morning at 1.8, hold further Lasix  Active Problems Acute kidney injury on CKD 3 A -Baseline creatinine around 1.0, increased to 1.8 due to Lasix hold Lasix and monitor.  Encourage p.o. intake  Elevated LFTs -slightly worse today, obtain RUQ [place]. Hold statin. ?Related to her cancer?  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 History of [place]  Anemia of chronic disease -Hemoglobin stable, no bleeding  History of thyroid cancer -On thyroid supplementations, TSH currently 6.9  Type 2 diabetes mellitus -Continue long-acting insulin, placed on sliding scale, carb modified diet  CBG (last 3)  Recent Labs    08/10/20 1604 08/10/20 2101 08/11/20 0610  GLUCAP 174* 118* 155*    Essential hypertension -Continue Coreg, blood pressure stable  Hyperlipidemia -hold statin due to rise in LFTs  History of DVT -INR supratherapeutic, continue Coumadin per  pharmacy. Of note, she has a history of rectus sheath hematoma in 2018 when she was treated with Lovenox, and had an IVC filter placed at that time   Elevated troponin -Possibly demand, trending down. No chest pain  Aortic atherosclerosis, vascular disease, coronary calcifications on CT scan -Noted  PT recommending SNF  Scheduled Meds: . carvedilol  25 mg Oral BID  . furosemide  40 mg Intravenous BID  . gabapentin  100 mg Oral Daily  . insulin aspart  0-9 Units Subcutaneous TID WC  . insulin detemir  7 Units Subcutaneous BID  . levothyroxine  200 mcg Oral QAC breakfast  . pantoprazole  40 mg Oral QHS  . rosuvastatin  20 mg Oral Daily  . sodium chloride flush  3 mL Intravenous Q12H  . Warfarin - Pharmacist Dosing Inpatient   Does not apply q1600   Continuous Infusions: . sodium chloride     PRN Meds:.sodium chloride, ondansetron, oxyCODONE-acetaminophen, polyethylene glycol, sodium chloride flush  Diet Orders (From admission, onward)    Start     Ordered   08/09/20 1940  Diet heart healthy/carb modified Room service appropriate? Yes; Fluid consistency: Thin  Diet effective now       Question Answer Comment  Diet-HS Snack? Nothing   Room service appropriate? Yes   Fluid consistency: Thin      08/09/20 1939          DVT prophylaxis:      Code Status: Full Code  Family Communication: none  Status is: Inpatient  Remains inpatient appropriate because:Inpatient level of care appropriate due to severity of illness   Dispo: The patient is from: Home              Anticipated d/c is to:  SNF              Anticipated d/c date is: 2 days              Patient currently is not medically stable to d/c.  Consultants:  Cardiology   Procedures:  2D echo  Microbiology  None   Antimicrobials: None     Objective: Vitals:   08/10/20 1950 08/10/20 2208 08/11/20 0020 08/11/20 0511  BP: 115/70 118/60 (!) 111/56 (!) 114/50  Pulse: (!) 111 72 70 63  Resp: 17 17 17 17    Temp: 98.6 F (37 C) 98.2 F (36.8 C) 98.7 F (37.1 C) 98.1 F (36.7 C)  TempSrc: Oral Oral Oral Oral  SpO2:  93% 94% 100%  Weight:    84.1 kg  Height:        Intake/Output Summary (Last 24 hours) at 08/11/2020 [id number] Last data filed at 08/11/2020 0500 Gross per 24 hour  Intake 963 ml  Output 751 ml  Net 212 ml   Filed Weights   08/09/20 1943 08/10/20 0008 08/11/20 0511  Weight: 82.9 kg 83.3 kg 84.1 kg    Examination:  Constitutional: No distress Eyes: No icterus ENMT: Moist mucous membranes Neck: normal, supple Respiratory: Lungs are clear, no wheezing, increased respiratory effort Cardiovascular: Regular rate and rhythm, no murmurs, 1+ edema Abdomen: Soft, tenderness to palpation of the right upper quadrant, ND, bowel sounds positive Musculoskeletal: no clubbing / cyanosis.  Skin: No rashes seen Neurologic: No focal deficits  Data Reviewed: I have independently reviewed following labs and imaging studies   CBC: Recent Labs  Lab 08/09/20 0700 08/10/20 0227 08/11/20 0421  WBC 9.9 11.7* 13.0*  NEUTROABS 8.3*  --   --   HGB 9.3* 8.9* 9.4*  HCT 28.5* 28.0* 29.1*  MCV 95.3 93.3 93.0  PLT 153 191 [id number]   Basic Metabolic Panel: Recent Labs  Lab 08/09/20 0700 08/10/20 0227 08/11/20 0421  NA 137 136 140  K 3.8 3.5 3.2*  CL 100 97* 99  CO2 25 25 30   GLUCOSE 400* 495* 175*  BUN 30* 33* 32*  CREATININE 1.22* 1.45* 1.82*  CALCIUM 7.9* 8.0* 7.8*   Liver Function Tests: Recent Labs  Lab 08/09/20 0900 08/10/20 0227 08/11/20 0421  AST 61* 67* 199*  ALT 25 30 57*  ALKPHOS 125 145* 176*  BILITOT 0.8 0.5 0.8  PROT 6.1* 6.2* 6.1*  ALBUMIN 1.9* 1.8* 1.6*   Coagulation Profile: Recent Labs  Lab 08/09/20 0900 08/10/20 0227 08/11/20 0421  INR 3.7* 4.6* 3.9*   HbA1C: Recent Labs    08/10/20 0227  HGBA1C 8.3*   CBG: Recent Labs  Lab 08/10/20 0442 08/10/20 1117 08/10/20 1604 08/10/20 2101 08/11/20 0610  GLUCAP 404* 265* 174* 118* 155*     Recent Results (from the past 240 hour(s))  Resp Panel by RT-PCR (Flu A&B, Covid) Nasopharyngeal Swab     Status: None   Collection Time: 08/09/20  9:12 AM   Specimen: Nasopharyngeal Swab; Nasopharyngeal(NP) swabs in vial transport medium  Result Value Ref Range Status   SARS Coronavirus 2 by RT PCR NEGATIVE NEGATIVE Final    Comment: (NOTE) SARS-CoV-2 target nucleic acids are NOT DETECTED.  The SARS-CoV-2 RNA is generally detectable in upper respiratory specimens during the acute phase of infection. The lowest concentration of SARS-CoV-2 viral copies this assay can detect is 138 copies/mL. A negative result does not preclude SARS-Cov-2 infection and should not be used as the sole basis for treatment or other patient  management decisions. A negative result may occur with  improper specimen collection/handling, submission of specimen other than nasopharyngeal swab, presence of viral mutation(s) within the areas targeted by this assay, and inadequate number of viral copies(<138 copies/mL). A negative result must be combined with clinical observations, patient history, and epidemiological information. The expected result is Negative.  Fact Sheet for Patients:  EntrepreneurPulse.com.au  Fact Sheet for Healthcare Providers:  IncredibleEmployment.be  This test is no t yet approved or cleared by the [place] FDA and  has been authorized for detection and/or diagnosis of SARS-CoV-2 by FDA under an Emergency Use Authorization (EUA). This EUA will remain  in effect (meaning this test can be used) for the duration of the COVID-19 declaration under Section 564(b)(1) of the Act, 21 U.S.C.section 360bbb-3(b)(1), unless the authorization is terminated  or revoked sooner.       Influenza A by PCR NEGATIVE NEGATIVE Final   Influenza B by PCR NEGATIVE NEGATIVE Final    Comment: (NOTE) The Xpert Xpress SARS-CoV-2/FLU/RSV plus assay is intended as an  aid in the diagnosis of influenza from Nasopharyngeal swab specimens and should not be used as a sole basis for treatment. Nasal washings and aspirates are unacceptable for Xpert Xpress SARS-CoV-2/FLU/RSV testing.  Fact Sheet for Patients: EntrepreneurPulse.com.au  Fact Sheet for Healthcare Providers: IncredibleEmployment.be  This test is not yet approved or cleared by the [place] FDA and has been authorized for detection and/or diagnosis of SARS-CoV-2 by FDA under an Emergency Use Authorization (EUA). This EUA will remain in effect (meaning this test can be used) for the duration of the COVID-19 declaration under Section 564(b)(1) of the Act, 21 U.S.C. section 360bbb-3(b)(1), unless the authorization is terminated or revoked.  Performed at [facility], [place] [address] [place], [place] [address]      Radiology Studies: No results found.  Marzetta Board, MD, PhD [facility]  Between 7 am - 7 pm I am available, please contact me via Amion or Securechat  Between 7 pm - 7 am I am not available, please contact night coverage MD/APP via Amion

## 2020-08-11 NOTE — Progress Notes (Signed)
Patient is refusing to go for her abdominal ultrasound. Asked if she would like to go later for the imagining or she does not want to go at all. Patient states she wouldn't like to go at all. MD notified

## 2020-08-11 NOTE — NC FL2 (Signed)
[place] LEVEL OF CARE SCREENING TOOL     IDENTIFICATION  Patient Name: Kaylee Mullins Birthdate: [birth year] Sex: female Admission Date (Current Location): 08/09/2020  [facility] and [place] Number:  Herbalist and Address:  [facility], [place] [address], [place], [place] [address]      Provider Number: [id number]  Attending Physician Name and Address:  Caren Griffins, MD  Relative Name and Phone Number:  EVOJJK [phone]    Current Level of Care: Hospital Recommended Level of Care: [place] Prior Approval Number:    Date Approved/Denied:   PASRR Number: [id number]  Discharge Plan: SNF    Current Diagnoses: Patient Active Problem List   Diagnosis Date Noted  . Acute on chronic diastolic CHF (congestive heart failure) ([place]) 08/10/2020  . SOB (shortness of breath) 08/09/2020  . Elevated troponin 08/09/2020  . Shortness of breath 08/09/2020  . Port-A-Cath in place 09/14/2019  . Goals of care, counseling/discussion 08/23/2019  . Traumatic hematoma of right lower leg 04/13/2019  . Cellulitis 04/12/2019  . Hypokalemia 08/25/2017  . Spinal stenosis   . Pneumonia   . Full dentures   . Depression   . Complication of anesthesia   . Chronic kidney disease   . Cancer ([place])   . Black tarry stools   . Arthritis   . Anxiety   . Hypertension 08/03/2017  . Paroxysmal SVT (supraventricular tachycardia) ([place]) 08/03/2017  . History of CVA (cerebrovascular accident) 08/03/2017  . Hypothyroidism, adult 08/03/2017  . Chronic diastolic heart failure ([place]) 08/03/2017  . History of cancer of ampulla of duodenum 08/03/2017  . Left flank pain 08/03/2017  . Diabetes mellitus type 2, uncontrolled ([place])   . CKD stage 3 due to type 2 diabetes mellitus ([place])   . SVT (supraventricular tachycardia) ([place]) 07/30/2017  . Edema extremities 07/30/2017  . DVT, lower extremity, recurrent, bilateral ([place]) 07/30/2017  . On total  parenteral nutrition (TPN) 04/08/2017  . Hematoma of rectus sheath 04/03/2017  . GERD (gastroesophageal reflux disease) 04/03/2017  . Malnutrition of moderate degree 02/07/2017  . Gastroparesis 02/06/2017  . Adenocarcinoma ([place]) 01/12/2017  . Ampullary carcinoma ([place]) 10/27/2016  . Essential hypertension 02/11/2016  . Lumbar stenosis with neurogenic claudication 02/10/2016  . Hyperlipidemia 02/10/2016  . Hypothyroidism 02/10/2016  . Musculoskeletal neck pain 12/13/2014  . Muscular pain 12/13/2014  . Stroke ([place]) 09/17/2014  . Numbness   . Acute CVA (cerebrovascular accident) ([place]) 09/16/2014  . Right renal mass 09/13/2014  . Cerebral infarction due to unspecified mechanism   . CVA (cerebral infarction) 09/11/2014  . Osteoarthritis 09/11/2014  . Postsurgical hypothyroidism 09/11/2014  . History of CVA (cerebral vascular accident) ([place]) 09/11/2014  . Diabetes mellitus without complication ([place]) 89/38/1017  . Dyslipidemia 04/20/2007    Orientation RESPIRATION BLADDER Height & Weight     Self,Time,Situation,Place  Normal Continent,External catheter Weight: 185 lb 6.5 oz (84.1 kg) Height:  5\' 4"  (162.6 cm)  BEHAVIORAL SYMPTOMS/MOOD NEUROLOGICAL BOWEL NUTRITION STATUS      Continent Diet (See discharge)  AMBULATORY STATUS COMMUNICATION OF NEEDS Skin   Extensive Assist Verbally Normal                       Personal Care Assistance Level of Assistance  Bathing,Feeding,Dressing Bathing Assistance: Limited assistance Feeding assistance: Limited assistance Dressing Assistance: Limited assistance     Functional Limitations Info  Sight,Hearing,Speech Sight Info: Impaired Hearing Info: Adequate Speech Info: Adequate  SPECIAL CARE FACTORS FREQUENCY  PT (By licensed PT),OT (By licensed OT)     PT Frequency: 5x per week OT Frequency: 5x per week            Contractures Contractures Info: Not present    Additional Factors Info  Insulin Sliding Scale Code Status  Info: Full Allergies Info: Hydralazine Hcl, Amlodipine Besylate, Darvon (Propoxyphene Hcl), Nyquil Multi-symptom (Pseudoeph-doxylamine-dm-apap), Metformin And Related   Insulin Sliding Scale Info: insulin detemir 2x daily,insulin aspart (novoLOG) injection 0-9 Units  with meals       Current Medications (08/11/2020):  This is the current hospital active medication list Current Facility-Administered Medications  Medication Dose Route Frequency Provider Last Rate Last Admin  . 0.9 %  sodium chloride infusion  250 mL Intravenous PRN Caren Griffins, MD      . carvedilol (COREG) tablet 25 mg  25 mg Oral BID Caren Griffins, MD   25 mg at 08/11/20 1031  . gabapentin (NEURONTIN) capsule 100 mg  100 mg Oral Daily Caren Griffins, MD   100 mg at 08/11/20 1031  . insulin aspart (novoLOG) injection 0-9 Units  0-9 Units Subcutaneous TID WC Caren Griffins, MD   2 Units at 08/11/20 1242  . insulin detemir (LEVEMIR) injection 7 Units  7 Units Subcutaneous BID Caren Griffins, MD   7 Units at 08/11/20 1031  . levothyroxine (SYNTHROID) tablet 200 mcg  200 mcg Oral QAC breakfast Caren Griffins, MD   200 mcg at 08/11/20 0515  . ondansetron (ZOFRAN) tablet 8 mg  8 mg Oral Q8H PRN Caren Griffins, MD      . oxyCODONE-acetaminophen (PERCOCET/ROXICET) 5-325 MG per tablet 1-2 tablet  1-2 tablet Oral Q8H PRN Caren Griffins, MD      . pantoprazole (PROTONIX) EC tablet 40 mg  40 mg Oral QHS Caren Griffins, MD   40 mg at 08/10/20 2252  . polyethylene glycol (MIRALAX / GLYCOLAX) packet 17 g  17 g Oral Daily PRN Pham, Minh Q, RPH-CPP      . sodium chloride flush (NS) 0.9 % injection 3 mL  3 mL Intravenous Q12H Caren Griffins, MD   3 mL at 08/11/20 1031  . sodium chloride flush (NS) 0.9 % injection 3 mL  3 mL Intravenous PRN Caren Griffins, MD      . Warfarin - Pharmacist Dosing Inpatient   Does not apply q1600 [place]ia Atlas, [facility]         Discharge Medications: Please see discharge  summary for a list of discharge medications.  Relevant Imaging Results:  Relevant Lab Results:   Additional Information SSN# [id number]  Bary Castilla, [place]

## 2020-08-11 NOTE — Progress Notes (Signed)
Initial Nutrition Assessment  DOCUMENTATION CODES:   Obesity unspecified  INTERVENTION:   Glucerna Shake po TID, each supplement provides 220 kcal and 10 grams of protein  Magic cup TID with meals, each supplement provides 290 kcal and 9 grams of protein  MVI with minerals daily   NUTRITION DIAGNOSIS:   Increased nutrient needs related to cancer and cancer related treatments as evidenced by estimated needs.    GOAL:   Patient will meet greater than or equal to 90% of their needs    MONITOR:   PO intake,Supplement acceptance,Weight trends,Labs,I & O's  REASON FOR ASSESSMENT:   Malnutrition Screening Tool    ASSESSMENT:   Pt admitted with dyspnea due to acute on chronic respiratory failure. 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   Pt denies N/V. Pt with good intake since admit, 90-100% meal completion x2 recorded meals.   Reviewed weight history. No significant weight changes noted.   UOP: 723ml documented x24 hours  Labs: K+ 3.2 (L), CBGs 155-183 Medications: ss novolog TID w/ meals, 7 units levemir BID, warfarin    NUTRITION - FOCUSED PHYSICAL EXAM:  Unable to perform at this time; will attempt at follow-up.  Diet Order:   Diet Order            Diet heart healthy/carb modified Room service appropriate? Yes; Fluid consistency: Thin  Diet effective now                 EDUCATION NEEDS:   No education needs have been identified at this time  Skin:  Skin Assessment: Reviewed RN Assessment  Last BM:  12/23  Height:   Ht Readings from Last 1 Encounters:  08/09/20 5\' 4"  (1.626 m)    Weight:   Wt Readings from Last 1 Encounters:  08/11/20 84.1 kg   BMI:  Body mass index is 31.83 kg/m.  Estimated Nutritional Needs:   Kcal:  1700-1900  Protein:  85-50 grams  Fluid:  >1.7L/d    Larkin Ina, MS, RD, LDN RD  pager number and weekend/on-call pager number located in [place].

## 2020-08-12 DIAGNOSIS — R7401 Elevation of levels of liver transaminase levels: Secondary | ICD-10-CM

## 2020-08-12 DIAGNOSIS — R7989 Other specified abnormal findings of blood chemistry: Secondary | ICD-10-CM | POA: Diagnosis not present

## 2020-08-12 DIAGNOSIS — R778 Other specified abnormalities of plasma proteins: Secondary | ICD-10-CM | POA: Diagnosis not present

## 2020-08-12 DIAGNOSIS — R0602 Shortness of breath: Secondary | ICD-10-CM | POA: Diagnosis not present

## 2020-08-12 DIAGNOSIS — R791 Abnormal coagulation profile: Secondary | ICD-10-CM | POA: Diagnosis not present

## 2020-08-12 LAB — CBC
HCT: 28.2 % — ABNORMAL LOW (ref 36.0–46.0)
Hemoglobin: 8.9 g/dL — ABNORMAL LOW (ref 12.0–15.0)
MCH: 29.5 pg (ref 26.0–34.0)
MCHC: 31.6 g/dL (ref 30.0–36.0)
MCV: 93.4 fL (ref 80.0–100.0)
Platelets: 220 10*3/uL (ref 150–400)
RBC: 3.02 MIL/uL — ABNORMAL LOW (ref 3.87–5.11)
RDW: 18.2 % — ABNORMAL HIGH (ref 11.5–15.5)
WBC: 15.3 10*3/uL — ABNORMAL HIGH (ref 4.0–10.5)
nRBC: 0.3 % — ABNORMAL HIGH (ref 0.0–0.2)

## 2020-08-12 LAB — COMPREHENSIVE METABOLIC PANEL
ALT: 60 U/L — ABNORMAL HIGH (ref 0–44)
AST: 159 U/L — ABNORMAL HIGH (ref 15–41)
Albumin: 1.6 g/dL — ABNORMAL LOW (ref 3.5–5.0)
Alkaline Phosphatase: 166 U/L — ABNORMAL HIGH (ref 38–126)
Anion gap: 10 (ref 5–15)
BUN: 38 mg/dL — ABNORMAL HIGH (ref 8–23)
CO2: 28 mmol/L (ref 22–32)
Calcium: 7.6 mg/dL — ABNORMAL LOW (ref 8.9–10.3)
Chloride: 100 mmol/L (ref 98–111)
Creatinine, Ser: 2.21 mg/dL — ABNORMAL HIGH (ref 0.44–1.00)
GFR, Estimated: 22 mL/min — ABNORMAL LOW (ref >60)
Glucose, Bld: 116 mg/dL — ABNORMAL HIGH (ref 70–99)
Potassium: 3.3 mmol/L — ABNORMAL LOW (ref 3.5–5.1)
Sodium: 138 mmol/L (ref 135–145)
Total Bilirubin: 0.5 mg/dL (ref 0.3–1.2)
Total Protein: 5.8 g/dL — ABNORMAL LOW (ref 6.5–8.1)

## 2020-08-12 LAB — PROTIME-INR
INR: 3.5 — ABNORMAL HIGH (ref 0.8–1.2)
Prothrombin Time: 33.6 seconds — ABNORMAL HIGH (ref 11.4–15.2)

## 2020-08-12 LAB — MAGNESIUM: Magnesium: 2.1 mg/dL (ref 1.7–2.4)

## 2020-08-12 LAB — GLUCOSE, CAPILLARY
Glucose-Capillary: 90 mg/dL (ref 70–99)
Glucose-Capillary: 103 mg/dL — ABNORMAL HIGH (ref 70–99)
Glucose-Capillary: 179 mg/dL — ABNORMAL HIGH (ref 70–99)
Glucose-Capillary: 213 mg/dL — ABNORMAL HIGH (ref 70–99)
Glucose-Capillary: 152 mg/dL — ABNORMAL HIGH (ref 70–99)

## 2020-08-12 MED ORDER — BISACODYL 10 MG RE SUPP
10.0000 mg | Freq: Once | RECTAL | Status: AC
  Administered 2020-08-12: 18:00:00 10 mg via RECTAL
  Filled 2020-08-12: qty 1

## 2020-08-12 MED ORDER — SODIUM CHLORIDE 0.9 % IV SOLN: INTRAVENOUS | Status: AC

## 2020-08-12 MED ORDER — SODIUM CHLORIDE 0.9 % IV SOLN: INTRAVENOUS | Status: DC

## 2020-08-12 MED ORDER — FAMOTIDINE 20 MG PO TABS
20.0000 mg | ORAL_TABLET | Freq: Every day | ORAL | Status: DC
  Administered 2020-08-12 – 2020-08-20 (×8): 20 mg via ORAL
  Filled 2020-08-12 (×8): qty 1

## 2020-08-12 MED ORDER — DOCUSATE SODIUM 100 MG PO CAPS
100.0000 mg | ORAL_CAPSULE | Freq: Every day | ORAL | Status: DC
  Administered 2020-08-17 – 2020-08-20 (×4): 100 mg via ORAL
  Filled 2020-08-12 (×6): qty 1

## 2020-08-12 MED ORDER — MELATONIN 5 MG PO TABS
5.0000 mg | ORAL_TABLET | Freq: Every day | ORAL | Status: DC
  Administered 2020-08-13 – 2020-08-19 (×6): 5 mg via ORAL
  Filled 2020-08-12 (×7): qty 1

## 2020-08-12 MED ORDER — POTASSIUM CHLORIDE CRYS ER 20 MEQ PO TBCR
20.0000 meq | EXTENDED_RELEASE_TABLET | Freq: Once | ORAL | Status: AC
  Administered 2020-08-12: 17:00:00 20 meq via ORAL
  Filled 2020-08-12: qty 1

## 2020-08-12 MED ORDER — POLYETHYLENE GLYCOL 3350 17 G PO PACK
17.0000 g | PACK | Freq: Two times a day (BID) | ORAL | Status: DC
  Administered 2020-08-12 – 2020-08-18 (×5): 17 g via ORAL
  Filled 2020-08-12 (×9): qty 1

## 2020-08-12 MED ORDER — POTASSIUM CHLORIDE CRYS ER 20 MEQ PO TBCR
60.0000 meq | EXTENDED_RELEASE_TABLET | Freq: Once | ORAL | Status: AC
  Administered 2020-08-12: 13:00:00 60 meq via ORAL
  Filled 2020-08-12: qty 3

## 2020-08-12 NOTE — TOC Progression Note (Signed)
Transition of Care [facility]) - Progression Note    Patient Details  Name: Kaylee Mullins MRN: [id number] Date of Birth: 09-29-1935  Transition of [facility]) CM/SW Contact  Jerric Oyen [place], [place] Phone Number: 08/12/2020, 3:00 PM  Clinical Narrative:     [facility] has covid case; unsure if they are able to accept admissions.   CSW discussed SNF offers with pt and pt daughter. Family to discuss further before deciding. CSW will follow up later.   Insurance auth started. Facility will need to be added to [place]. PPI#[id number]  Expected Discharge Plan: Skilled Nursing Facility Barriers to Discharge: Continued Medical Work up,SNF Pending bed offer  Expected Discharge Plan and Services Expected Discharge Plan: Skilled Nursing Facility       Living arrangements for the past 2 months: Single Family Home                                       Social Determinants of Health (SDOH) Interventions    Readmission Risk Interventions No flowsheet data found.

## 2020-08-12 NOTE — Progress Notes (Signed)
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    Subjective   Patient seen and examined, denies any complaints.  Denies shortness of breath or chest pain.   Assessment/Plan:     1. Dyspnea-unclear etiology, chest x-ray showed no acute disease.  BNP was elevated to 391.6.  Patient was diuresed with Lasix 40 mg IV x2, cardiology was consulted.  No significant urine output with Lasix however patient renal function got worse.  At this time patient is not requiring oxygen.  Dyspnea seems to have resolved.  She has history of DVT currently on Coumadin.  PE is unlikely.  Patient did get chemotherapy last week and has ongoing fatigue and generalized weakness.  Echocardiogram during this admission showed EF 50 to 55%, no RWMA. 2. ?  Chronic diastolic CHF-cardiology was consulted for possible diastolic CHF, patient was given IV Lasix 40 mg x 2 with no significant urine output.  Renal function has worsened. 3. Acute kidney injury-patient's creatinine went from 2.21 this morning.  Lasix has been on hold since yesterday.  We will start her on gentle IV hydration with normal saline at 75 mL/h.  Follow BMP in am. 4. Transaminitis-unclear etiology, LFTs are elevated, slowly improving.  Will obtain abdominal ultrasound.  She does have known hepatic metastasis.  Patient has history of biliary obstruction, status post placement of metal stent and bile duct on 10/23/2016.  She might need GI evaluation based on abdominal ultrasound results. 5. Pancreatic cancer-initially diagnosed in 2018 when she underwent Whipple procedure.  Now  with progression of disease, currently on chemotherapy. 6. Diabetes mellitus type 2 - CBG well controlled, continue insulin detemir 7 units twice daily, insulin aspart sliding scale insulin 3 times daily with meals. 7. History of DVT-continue Coumadin per pharmacy. Of note, she has a history of rectus sheath hematoma in 2018 when she was treated with Lovenox, and had an IVC filter placed at that time  8. Elevated troponin-likely demand ischemia.  Trending down.  No further intervention as per cardiology.       COVID-19 Labs  No results for input(s): DDIMER, FERRITIN, LDH, CRP in the last 72 hours.  Lab Results  Component Value Date   [place] NEGATIVE 08/09/2020     Scheduled medications:   . carvedilol  25 mg Oral BID  . Chlorhexidine Gluconate Cloth  6 each Topical Daily  . feeding supplement (GLUCERNA SHAKE)  237 mL Oral TID BM  . gabapentin  100 mg Oral Daily  . insulin aspart  0-9 Units Subcutaneous TID WC  . insulin detemir  7 Units Subcutaneous BID  . levothyroxine  200 mcg Oral QAC breakfast  . multivitamin with minerals  1 tablet Oral Daily  . pantoprazole  40 mg Oral QHS  . sodium chloride flush  3 mL Intravenous Q12H  . Warfarin - Pharmacist Dosing Inpatient   Does not apply [id number]         CBG: Recent Labs  Lab 08/11/20 1117 08/11/20 2104 08/12/20 0510 08/12/20 0749 08/12/20 1221  GLUCAP 183* 139* 90 103* 179*    SpO2: 98 % O2 Flow Rate (L/min): 3 L/min FiO2 (%): 100 %    CBC:  Recent Labs  Lab 08/09/20 0700 08/10/20 0227 08/11/20 0421 08/12/20 0316  WBC 9.9 11.7* 13.0* 15.3*  NEUTROABS 8.3*  --   --   --   HGB 9.3* 8.9* 9.4* 8.9*  HCT 28.5* 28.0* 29.1* 28.2*  MCV 95.3 93.3 93.0 93.4  PLT 153 191 221 220    Basic Metabolic Panel: Recent Labs  Lab 08/09/20 0700 08/10/20 0227 08/11/20 0421 08/12/20 0316  NA 137 136 140 138  K 3.8 3.5 3.2* 3.3*  CL 100 97* 99 100  CO2 25 25 30 28   GLUCOSE 400* 495* 175* 116*  BUN 30* 33* 32* 38*   CREATININE 1.22* 1.45* 1.82* 2.21*  CALCIUM 7.9* 8.0* 7.8* 7.6*  MG  --   --   --  2.1     Liver Function Tests: Recent Labs  Lab 08/09/20 0900 08/10/20 0227 08/11/20 0421 08/12/20 0316  AST 61* 67* 199* 159*  ALT 25 30 57* 60*  ALKPHOS 125 145* 176* 166*  BILITOT 0.8 0.5 0.8 0.5  PROT 6.1* 6.2* 6.1* 5.8*  ALBUMIN 1.9* 1.8* 1.6* 1.6*     Antibiotics: Anti-infectives (From admission, onward)   None       DVT prophylaxis: Warfarin  Code Status: Full code  Family Communication: No family at bedside   Consultants:  Cardiology  Procedures:      Objective   Vitals:   08/11/20 1952 08/12/20 0513 08/12/20 0515 08/12/20 0813  BP: (!) 102/54  (!) 113/54 (!) 111/56  Pulse: 90  66 66  Resp: 19  16 18   Temp: 98.4 F (36.9 C)  98.6 F (37 C) 99.3 F (37.4 C)  TempSrc: Oral  Oral Oral  SpO2: 96%  96% 98%  Weight:  81.3 kg    Height:        Intake/Output Summary (Last 24 hours) at 08/12/2020 1345 Last data filed at 08/12/2020 0927 Gross per 24 hour  Intake 246 ml  Output 700 ml  Net -454 ml    12/25 1901 - 12/27 0700 In: 726 [P.O.:720; I.V.:6] Out: 1001 [Urine:1001]  Filed Weights   08/10/20 0008 08/11/20 0511 08/12/20 0513  Weight: 83.3 kg 84.1 kg 81.3 kg    Physical Examination:    General-appears in no acute distress  Heart-S1-S2, regular, no murmur auscultated  Lungs-clear to auscultation bilaterally, no wheezing or crackles auscultated  Abdomen-soft, nontender, no organomegaly  Extremities-no edema in the lower extremities  Neuro-alert, oriented x3, no focal deficit noted   Status is: Inpatient  Dispo: The patient is from: Home              Anticipated d/c is to: Home              Anticipated d/c date is: 08/13/2020              Patient currently not stable for discharge  Barrier to discharge-acute kidney injury            Data Reviewed:   Recent Results (from the past 240 hour(s))  Resp Panel by RT-PCR (Flu  A&B, Covid) Nasopharyngeal Swab     Status: None   Collection Time: 08/09/20  9:12 AM   Specimen: Nasopharyngeal Swab; Nasopharyngeal(NP) swabs in vial transport medium  Result Value Ref Range Status   SARS Coronavirus 2 by RT PCR NEGATIVE NEGATIVE Final    Comment: (NOTE) SARS-CoV-2 target nucleic acids are NOT DETECTED.  The SARS-CoV-2 RNA is generally detectable in upper respiratory specimens during the acute phase of  infection. The lowest concentration of SARS-CoV-2 viral copies this assay can detect is 138 copies/mL. A negative result does not preclude SARS-Cov-2 infection and should not be used as the sole basis for treatment or other patient management decisions. A negative result may occur with  improper specimen collection/handling, submission of specimen other than nasopharyngeal swab, presence of viral mutation(s) within the areas targeted by this assay, and inadequate number of viral copies(<138 copies/mL). A negative result must be combined with clinical observations, patient history, and epidemiological information. The expected result is Negative.  Fact Sheet for Patients:  EntrepreneurPulse.com.au  Fact Sheet for Healthcare Providers:  IncredibleEmployment.be  This test is no t yet approved or cleared by the [place] FDA and  has been authorized for detection and/or diagnosis of SARS-CoV-2 by FDA under an Emergency Use Authorization (EUA). This EUA will remain  in effect (meaning this test can be used) for the duration of the COVID-19 declaration under Section 564(b)(1) of the Act, 21 U.S.C.section 360bbb-3(b)(1), unless the authorization is terminated  or revoked sooner.       Influenza A by PCR NEGATIVE NEGATIVE Final   Influenza B by PCR NEGATIVE NEGATIVE Final    Comment: (NOTE) The Xpert Xpress SARS-CoV-2/FLU/RSV plus assay is intended as an aid in the diagnosis of influenza from Nasopharyngeal swab specimens  and should not be used as a sole basis for treatment. Nasal washings and aspirates are unacceptable for Xpert Xpress SARS-CoV-2/FLU/RSV testing.  Fact Sheet for Patients: EntrepreneurPulse.com.au  Fact Sheet for Healthcare Providers: IncredibleEmployment.be  This test is not yet approved or cleared by the [place] FDA and has been authorized for detection and/or diagnosis of SARS-CoV-2 by FDA under an Emergency Use Authorization (EUA). This EUA will remain in effect (meaning this test can be used) for the duration of the COVID-19 declaration under Section 564(b)(1) of the Act, 21 U.S.C. section 360bbb-3(b)(1), unless the authorization is terminated or revoked.  Performed at [facility], [place] [address], [place], [place] [address]     BNP (last 3 results) Recent Labs    08/09/20 0701  BNP 391.6*        Elmer   [facility] If 7PM-7AM, please contact night-coverage at [email or web address], Office  [phone]   08/12/2020, 1:45 PM  LOS: 2 days

## 2020-08-12 NOTE — Progress Notes (Addendum)
Progress Note  Patient Name: Kaylee Mullins Date of Encounter: 08/12/2020  Primary Cardiologist: Dorris Carnes, MD   Subjective   She reports feeling poorly this morning but has a hard time elaborating specific complaints. She reports still feeling SOB. No complaints of chest pain or palpitations. She reports chronic LE edema L<R.   Inpatient Medications    Scheduled Meds: . carvedilol  25 mg Oral BID  . Chlorhexidine Gluconate Cloth  6 each Topical Daily  . feeding supplement (GLUCERNA SHAKE)  237 mL Oral TID BM  . gabapentin  100 mg Oral Daily  . insulin aspart  0-9 Units Subcutaneous TID WC  . insulin detemir  7 Units Subcutaneous BID  . levothyroxine  200 mcg Oral QAC breakfast  . multivitamin with minerals  1 tablet Oral Daily  . pantoprazole  40 mg Oral QHS  . sodium chloride flush  3 mL Intravenous Q12H  . Warfarin - Pharmacist Dosing Inpatient   Does not apply q1600   Continuous Infusions: . sodium chloride    . sodium chloride     PRN Meds: sodium chloride, ondansetron, oxyCODONE-acetaminophen, polyethylene glycol, sodium chloride flush   Vital Signs    Vitals:   08/11/20 1952 08/12/20 0513 08/12/20 0515 08/12/20 0813  BP: (!) 102/54  (!) 113/54 (!) 111/56  Pulse: 90  66 66  Resp: 19  16 18   Temp: 98.4 F (36.9 C)  98.6 F (37 C) 99.3 F (37.4 C)  TempSrc: Oral  Oral Oral  SpO2: 96%  96% 98%  Weight:  81.3 kg    Height:        Intake/Output Summary (Last 24 hours) at 08/12/2020 0843 Last data filed at 08/12/2020 0818 Gross per 24 hour  Intake 123 ml  Output 700 ml  Net -577 ml   Filed Weights   08/10/20 0008 08/11/20 0511 08/12/20 0513  Weight: 83.3 kg 84.1 kg 81.3 kg    Telemetry    Sinus rhythm with frequent runs of atrial tachycardia, not clearly Afib - Personally Reviewed  ECG    No new tracings - Personally Reviewed  Physical Exam   GEN: No acute distress appearing fatigued.   Neck: No JVD, no carotid bruits Cardiac: RRR, no  murmurs, rubs, or gallops.  Respiratory: Clear to auscultation bilaterally, no wheezes/ rales/ rhonchi GI: NABS, Soft, nontender, non-distended  MS: 1+ LE edema L>R; No deformity. Neuro:  Nonfocal, moving all extremities spontaneously though seems to have generalized weakness Psych: Normal affect   Labs    Chemistry Recent Labs  Lab 08/10/20 0227 08/11/20 0421 08/12/20 0316  NA 136 140 138  K 3.5 3.2* 3.3*  CL 97* 99 100  CO2 25 30 28   GLUCOSE 495* 175* 116*  BUN 33* 32* 38*  CREATININE 1.45* 1.82* 2.21*  CALCIUM 8.0* 7.8* 7.6*  PROT 6.2* 6.1* 5.8*  ALBUMIN 1.8* 1.6* 1.6*  AST 67* 199* 159*  ALT 30 57* 60*  ALKPHOS 145* 176* 166*  BILITOT 0.5 0.8 0.5  GFRNONAA 36* 27* 22*  ANIONGAP 14 11 10      Hematology Recent Labs  Lab 08/10/20 0227 08/11/20 0421 08/12/20 0316  WBC 11.7* 13.0* 15.3*  RBC 3.00* 3.13* 3.02*  HGB 8.9* 9.4* 8.9*  HCT 28.0* 29.1* 28.2*  MCV 93.3 93.0 93.4  MCH 29.7 30.0 29.5  MCHC 31.8 32.3 31.6  RDW 18.2* 18.3* 18.2*  PLT 191 221 220    Cardiac EnzymesNo results for input(s): TROPONINI in the last 168 hours.  No results for input(s): TROPIPOC in the last 168 hours.   BNP Recent Labs  Lab 08/09/20 0701  BNP 391.6*     DDimer No results for input(s): DDIMER in the last 168 hours.   Radiology    No results found.  Cardiac Studies   TTE 08/09/2020 1. Mild septal hypokinesis. Left ventricular ejection fraction, by  estimation, is 50 to 55%. The left ventricle has low normal function. The  left ventricle has no regional wall motion abnormalities. Left ventricular  diastolic parameters are consistent  with Grade II diastolic dysfunction (pseudonormalization). Elevated left  ventricular end-diastolic pressure.  2. Right ventricular systolic function is normal. The right ventricular  size is normal.  3. Left atrial size was mildly dilated.  4. The mitral valve is normal in structure. Trivial mitral valve  regurgitation. No  evidence of mitral stenosis.  5. The aortic valve is calcified. There is mild calcification of the  aortic valve. There is mild thickening of the aortic valve. Aortic valve  regurgitation is mild. No aortic stenosis is present.  6. The inferior vena cava is normal in size with greater than 50%  respiratory variability, suggesting right atrial pressure of 3 mmHg.   Patient Profile     Kaylee Mullins is a 84 y.o. female with diabetes, hypertension, stroke, ampullary pancreatic cancer who was admitted on 08/09/2020 with acute onset shortness of breath.  Assessment & Plan    1. SOB/elevated BNP: patient presented with progressive SOB/DOE. CXR clear. BNP elevated 391. Echo this admission with EF 50-55%, no RWMA, G2DD, normal RV function/size, mild LAE, and no significant valvular abnormalities. She was given IV lasix 40mg  x2 doses with marginal UOP and significant bump in Cr from 1.22 on admission to 1.82 prompting lasix to be held. Cr continues to rise today to 2.21. It was felt that her symptoms were not 2/2 CHF and perhaps she was dehydrated. IVFs ordered for today per primary team. Weight is down to 179lbs today from 185lbs yesterday.  She does not appear volume overloaded on exam. She has a history of DVT on coumadin, though with supratherapeutic INR, PE is unlikely. She does have a RLL lung lesion which could be contributing. She is currently on chemotherapy with ongoing fatigue and weakness. She does have evidence of 3 vessel coronary artery calcification on CT 04/2020. Increasingly a poor candidate for ischemic evaluation with rising Cr, advanced age, and cancer - Continue to hold lasix - Agree with IVF  - Continue to monitor volume status closely - Continue carvedilol  2. Elevated troponin in patient with 3-vessel coronary artery calcifications on CT: HsTrop peaked at 124 and trended down; not c/w ACS. Suspect demand ischemia in the setting of acute respiratory failure. Last ischemic  evaluation was a NST in 2017 which was without ischemia. Not on aspirin given coumadin use. INR's have been supratherapeutic this admission, therefore coumadin and heparin held. Additionally LFTs have been elevated and statin is on hold. With rising Cr, advanced age, cancer, and declining functional status, she is a poor candidate for ischemic evaluation at this time. She has no complaints of chest pain though still notes some ongoing SOB despite reassuring exam. - Favor ongoing medical management.  - Continue carvedilol - Anticipate restarting statin as LFTs improve  3. Frequent runs of atrial tachycardia: noted on telemetry this admission. She has no complaints of palpitations or racing heart beat sensations. Home procardia is on hold due to soft blood pressures - Continue carvedilol -  Restart procardia as BP will allow  4. HTN: BP on the soft side but stable. Home procardia is on hold. - Continue carvedilol for now  5. HLD: No recent lipids on file. Home rosuvastatin on hold due to elevated LFTs - Anticipate restarting statin once LFTs normalize  6. Transaminitis: mild bump in FLTs this admission. Home statin on hold. RUQ [place] ordered. She has known hepatic metastases which were slightly improved on last CT A/P 04/2020.  - Continue management per primary team  7. Hypokalemia: K 3.3 this morning.  - Will given 60 mEq po K this morning - Continue to monitor  8. Pancreatic cancer: initially diagnosed in 2018 when she underwent Whipple. No with progression of disease currently on chemotherapy. Overall picture appears to be more failure to thrive.  - Continue management per primary team  For questions or updates, please contact [facility] Please consult [email or web address] for contact info under Cardiology/STEMI.      Signed, Beatriz Stallion, PA-C  08/12/2020, 8:43 AM   [phone] As above, patient seen and examined.  Patient does describe dyspnea.  However she does not appear to be  significantly volume overloaded on examination and renal function deteriorated with diuresis.  I agree with holding Lasix.  Troponin minimally elevated but no clear trend and not consistent with acute coronary syndrome.  No plans for further ischemia evaluation particularly in light of metastatic pancreatic cancer.  Continue beta-blocker for short runs of atrial tachycardia.  Other issues per primary care.  Would decrease Lasix at discharge to 40 mg daily with close follow-up of renal function.  Follow-up APP 2 to 4 weeks after discharge and Dr. Tenny Craw 3 months.  We will sign off.  Please call with questions. Olga Millers

## 2020-08-12 NOTE — Progress Notes (Signed)
ANTICOAGULATION CONSULT NOTE Pharmacy Consult for warfarin Indication: VTE treatment  Allergies  Allergen Reactions  . Hydralazine Hcl Shortness Of Breath    Pt had severe respiratory distress after receiving a dose  . Amlodipine Besylate Swelling  . Darvon [Propoxyphene Hcl] Other (See Comments)    Hallucinations   . Nyquil Multi-Symptom [Pseudoeph-Doxylamine-Dm-Apap] Other (See Comments)    Makes pt not "feel right in her head"  . Metformin And Related Diarrhea    Labs: Recent Labs    08/09/20 0900 08/09/20 0900 08/09/20 1013 08/09/20 1401 08/10/20 0227 08/11/20 0421 08/12/20 0316  HGB  --    < >  --   --  8.9* 9.4* 8.9*  HCT  --   --   --   --  28.0* 29.1* 28.2*  PLT  --   --   --   --  191 221 220  LABPROT 35.6*  --   --   --  42.4* 37.2* 33.6*  INR 3.7*  --   --   --  4.6* 3.9* 3.5*  CREATININE  --   --   --   --  1.45* 1.82* 2.21*  TROPONINIHS 124*  --  115* 93*  --   --   --    < > = values in this interval not displayed.    Estimated Creatinine Clearance: 20.2 mL/min (A) (by C-G formula based on SCr of 2.21 mg/dL (H)).  Assessment: 84 yo female who presented with SOB. PTA the patient is on warfarin at home for hx of DVT. Of note, the patient has a history of rectus sheath hematoma while on enoxaparin and had and IVC filter placed.  INR upon admission was supratherapeutic at 3.7. The last reported dose of warfarin was on 12/23 @ 1900. Pharmacy is consulted to dose warfarin.     - PTA warfarin dose: : 7.5 mg PO daily   INR this morning still supra-therapeutic at 3.5 despite holding warfarin  Goal of Therapy:  INR 2-3 Monitor platelets by anticoagulation protocol: Yes   Plan:  - HOLD warfarin today - Obtain daily PT/INR and CBC - Monitor closely for signs and symptoms of bleeding   Thank you Okey Regal, PharmD  08/12/2020 7:53 AM  Please check [email or web address] for unit-specific pharmacy phone numbers.

## 2020-08-13 ENCOUNTER — Inpatient Hospital Stay (HOSPITAL_COMMUNITY): Payer: Medicare Other

## 2020-08-13 DIAGNOSIS — R109 Unspecified abdominal pain: Secondary | ICD-10-CM

## 2020-08-13 DIAGNOSIS — I639 Cerebral infarction, unspecified: Secondary | ICD-10-CM

## 2020-08-13 DIAGNOSIS — C241 Malignant neoplasm of ampulla of Vater: Secondary | ICD-10-CM | POA: Diagnosis not present

## 2020-08-13 DIAGNOSIS — I5033 Acute on chronic diastolic (congestive) heart failure: Secondary | ICD-10-CM | POA: Diagnosis not present

## 2020-08-13 DIAGNOSIS — R7989 Other specified abnormal findings of blood chemistry: Secondary | ICD-10-CM | POA: Diagnosis not present

## 2020-08-13 DIAGNOSIS — N390 Urinary tract infection, site not specified: Secondary | ICD-10-CM | POA: Diagnosis not present

## 2020-08-13 DIAGNOSIS — C787 Secondary malignant neoplasm of liver and intrahepatic bile duct: Secondary | ICD-10-CM | POA: Diagnosis not present

## 2020-08-13 DIAGNOSIS — C259 Malignant neoplasm of pancreas, unspecified: Secondary | ICD-10-CM

## 2020-08-13 DIAGNOSIS — R4182 Altered mental status, unspecified: Secondary | ICD-10-CM | POA: Diagnosis not present

## 2020-08-13 DIAGNOSIS — E860 Dehydration: Secondary | ICD-10-CM | POA: Diagnosis not present

## 2020-08-13 LAB — GLUCOSE, CAPILLARY
Glucose-Capillary: 150 mg/dL — ABNORMAL HIGH (ref 70–99)
Glucose-Capillary: 157 mg/dL — ABNORMAL HIGH (ref 70–99)
Glucose-Capillary: 128 mg/dL — ABNORMAL HIGH (ref 70–99)
Glucose-Capillary: 103 mg/dL — ABNORMAL HIGH (ref 70–99)

## 2020-08-13 LAB — AMMONIA: Ammonia: 27 umol/L (ref 9–35)

## 2020-08-13 LAB — BASIC METABOLIC PANEL
Anion gap: 10 (ref 5–15)
BUN: 46 mg/dL — ABNORMAL HIGH (ref 8–23)
CO2: 26 mmol/L (ref 22–32)
Calcium: 7.3 mg/dL — ABNORMAL LOW (ref 8.9–10.3)
Chloride: 102 mmol/L (ref 98–111)
Creatinine, Ser: 2.22 mg/dL — ABNORMAL HIGH (ref 0.44–1.00)
GFR, Estimated: 22 mL/min — ABNORMAL LOW (ref >60)
Glucose, Bld: 132 mg/dL — ABNORMAL HIGH (ref 70–99)
Potassium: 4.3 mmol/L (ref 3.5–5.1)
Sodium: 138 mmol/L (ref 135–145)

## 2020-08-13 LAB — COMPREHENSIVE METABOLIC PANEL
ALT: 50 U/L — ABNORMAL HIGH (ref 0–44)
AST: 102 U/L — ABNORMAL HIGH (ref 15–41)
Albumin: 1.7 g/dL — ABNORMAL LOW (ref 3.5–5.0)
Alkaline Phosphatase: 185 U/L — ABNORMAL HIGH (ref 38–126)
Anion gap: 12 (ref 5–15)
BUN: 44 mg/dL — ABNORMAL HIGH (ref 8–23)
CO2: 24 mmol/L (ref 22–32)
Calcium: 7.5 mg/dL — ABNORMAL LOW (ref 8.9–10.3)
Chloride: 102 mmol/L (ref 98–111)
Creatinine, Ser: 2.24 mg/dL — ABNORMAL HIGH (ref 0.44–1.00)
GFR, Estimated: 21 mL/min — ABNORMAL LOW (ref >60)
Glucose, Bld: 157 mg/dL — ABNORMAL HIGH (ref 70–99)
Potassium: 5.5 mmol/L — ABNORMAL HIGH (ref 3.5–5.1)
Sodium: 138 mmol/L (ref 135–145)
Total Bilirubin: 1.5 mg/dL — ABNORMAL HIGH (ref 0.3–1.2)
Total Protein: 6 g/dL — ABNORMAL LOW (ref 6.5–8.1)

## 2020-08-13 LAB — PROTIME-INR
INR: 2.8 — ABNORMAL HIGH (ref 0.8–1.2)
Prothrombin Time: 28.2 seconds — ABNORMAL HIGH (ref 11.4–15.2)

## 2020-08-13 MED ORDER — WARFARIN SODIUM 5 MG PO TABS
5.0000 mg | ORAL_TABLET | Freq: Once | ORAL | Status: AC
  Filled 2020-08-13: qty 1

## 2020-08-13 MED ORDER — SODIUM CHLORIDE 0.9 % IV SOLN: INTRAVENOUS | Status: DC

## 2020-08-13 NOTE — Progress Notes (Addendum)
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   Subjective   Patient seen and examined.  She is drowsy.  She just got Percocet per nursing.  She is complaining of nonspecific abdominal discomfort and nausea.  Denies having any vomiting or diarrhea. Cr worsening.   Assessment/Plan:   1. Dyspnea-unclear etiology, chest x-ray showed no acute disease.  BNP was elevated to 391.6.  Patient was diuresed with Lasix 40 mg IV x2, cardiology was consulted.  No significant urine output with Lasix however patient renal function got worse.  At this time patient is not requiring oxygen.  Dyspnea seems to have resolved.  She has history of DVT currently on Coumadin.  PE is unlikely.  Patient did get chemotherapy last week and has ongoing fatigue and generalized weakness.  Echocardiogram during this admission showed EF 50 to 55%, no RWMA. 2. ?  Chronic diastolic CHF-cardiology was consulted for possible diastolic CHF, patient was given IV Lasix 40 mg x 2 with no significant urine output.  Renal function has worsened. 3. Acute kidney injury-patient's creatinine went from 2.24 this morning.  Lasix has been on hold.  Start gentle IV hydration with normal saline at 75 mL/h.  Follow BMP in am. 4. Transaminitis-unclear etiology, LFTs are elevated, slowly improving. She does have known hepatic metastasis.  Patient has history of biliary obstruction, status post placement of metal stent and bile duct on 10/23/2016.        -Abdominal ultrasound per report multiple liver masses likely metastatic foci from pancreatic carcinoma, degenerating  cystic lesion in anterior segment of the right lobe of the liver, likely treated metastasis versus infected cystic area. -Unfortunately due to her worsening creatinine unable to get a CT with contrast at this time.  We will continue with gentle hydration and hopefully once creatinine improves we may be able to get a CT with contrast. -Discussed with Oncology, Dr. Learta Codding. He will see the patient. 5. Pancreatic cancer-initially diagnosed in 2018 when she underwent Whipple procedure.  Now with progression of disease, currently on chemotherapy. 6. Diabetes mellitus type 2 - CBG well controlled, continue insulin detemir 7 units twice daily, insulin aspart sliding scale insulin 3 times daily with meals. 7. History of DVT-continue Coumadin per pharmacy. Of note, she has a history of rectus sheath hematoma in 2018 when she was treated with Lovenox, and had an IVC filter placed at that time  8. Elevated troponin-likely demand ischemia.  Trending down.  No further intervention as per cardiology. 9. Hyperkalemia: Potassium noted to be 5.5 this morning.  Ordered repeat BMP.  If continues to be high we may need to give her Kayexalate. 10. Leukocytosis: WBC count worsening.  Etiology?  Afebrile at this time.  Continue to monitor.  If continues to worsen we may need to consider cultures; CT imaging to evaluate for etiology and consider empiric antibiotics.   COVID-19 Labs  No results for input(s): DDIMER, FERRITIN, LDH, CRP in the last 72 hours.  Lab Results  Component Value Date   Dana NEGATIVE 08/09/2020     Scheduled medications:   . carvedilol  25 mg Oral BID  . Chlorhexidine Gluconate Cloth  6 each Topical  Daily  . docusate sodium  100 mg Oral Daily  . famotidine  20 mg Oral Daily  . feeding supplement (GLUCERNA SHAKE)  237 mL Oral TID BM  . gabapentin  100 mg Oral Daily  . insulin aspart  0-9 Units Subcutaneous TID WC  . insulin detemir  7 Units Subcutaneous BID  . levothyroxine  200 mcg  Oral QAC breakfast  . melatonin  5 mg Oral QHS  . multivitamin with minerals  1 tablet Oral Daily  . pantoprazole  40 mg Oral QHS  . polyethylene glycol  17 g Oral BID  . sodium chloride flush  3 mL Intravenous Q12H  . Warfarin - Pharmacist Dosing Inpatient   Does not apply q1600    CBG: Recent Labs  Lab 08/12/20 0749 08/12/20 1221 08/12/20 1540 08/12/20 2103 08/13/20 0620  GLUCAP 103* 179* 213* 152* 150*    SpO2: 96 % O2 Flow Rate (L/min): 3 L/min FiO2 (%): 100 %    CBC: Recent Labs  Lab 08/09/20 0700 08/10/20 0227 08/11/20 0421 08/12/20 0316  WBC 9.9 11.7* 13.0* 15.3*  NEUTROABS 8.3*  --   --   --   HGB 9.3* 8.9* 9.4* 8.9*  HCT 28.5* 28.0* 29.1* 28.2*  MCV 95.3 93.3 93.0 93.4  PLT 153 191 221 220    Basic Metabolic Panel: Recent Labs  Lab 08/09/20 0700 08/10/20 0227 08/11/20 0421 08/12/20 0316 08/13/20 0330  NA 137 136 140 138 138  K 3.8 3.5 3.2* 3.3* 5.5*  CL 100 97* 99 100 102  CO2 25 25 30 28 24   GLUCOSE 400* 495* 175* 116* 157*  BUN 30* 33* 32* 38* 44*  CREATININE 1.22* 1.45* 1.82* 2.21* 2.24*  CALCIUM 7.9* 8.0* 7.8* 7.6* 7.5*  MG  --   --   --  2.1  --      Liver Function Tests: Recent Labs  Lab 08/09/20 0900 08/10/20 0227 08/11/20 0421 08/12/20 0316 08/13/20 0330  AST 61* 67* 199* 159* 102*  ALT 25 30 57* 60* 50*  ALKPHOS 125 145* 176* 166* 185*  BILITOT 0.8 0.5 0.8 0.5 1.5*  PROT 6.1* 6.2* 6.1* 5.8* 6.0*  ALBUMIN 1.9* 1.8* 1.6* 1.6* 1.7*     Antibiotics: Anti-infectives (From admission, onward)   None       DVT prophylaxis: Warfarin  Code Status: Full code  Family Communication: No family at bedside   Consultants:  Cardiology  Procedures:      Objective   Vitals:   08/12/20 0515 08/12/20 0813 08/12/20 1925 08/13/20 0355  BP: (!) 113/54 (!) 111/56 130/60 (!) 122/55  Pulse: 66 66 68 66  Resp: 16 18 20 20   Temp: 98.6 F (37 C) 99.3 F (37.4 C) 98.6 F (37 C) 99.2 F (37.3 C)  TempSrc: Oral Oral Oral  Oral  SpO2: 96% 98% 98% 96%  Weight:    82.5 kg  Height:        Intake/Output Summary (Last 24 hours) at 08/13/2020 0829 Last data filed at 08/13/2020 08/15/2020 Gross per 24 hour  Intake 1189.35 ml  Output 350 ml  Net 839.35 ml    12/26 1901 - 12/28 0700 In: 1312.4 [P.O.:600; I.V.:712.4] Out: 550 [Urine:550]  Filed Weights   08/11/20 0511 08/12/20 0513 08/13/20 0355  Weight: 84.1 kg 81.3 kg 82.5 kg    Physical Examination:   General-drowsy but arousable, not in any acute distress  Heart-S1-S2, regular  Lungs-clear to auscultation bilaterally, no wheezing or crackles auscultated  Abdomen-soft, nonspecific  tenderness without any guarding or rebound, positive bowel sounds  Extremities-no edema in the lower extremities  Neuro-drowsy but arousable, oriented x3, has debility with generalized weakness, no focal deficits noted  Status is: Inpatient  Dispo: The patient is from: Home              Anticipated d/c is to: Home              Anticipated d/c date is: 2 -3 days pending improvement              Patient currently not stable for discharge  Barrier to discharge-acute kidney injury     Data Reviewed:   Recent Results (from the past 240 hour(s))  Resp Panel by RT-PCR (Flu A&B, Covid) Nasopharyngeal Swab     Status: None   Collection Time: 08/09/20  9:12 AM   Specimen: Nasopharyngeal Swab; Nasopharyngeal(NP) swabs in vial transport medium  Result Value Ref Range Status   SARS Coronavirus 2 by RT PCR NEGATIVE NEGATIVE Final    Comment: (NOTE) SARS-CoV-2 target nucleic acids are NOT DETECTED.  The SARS-CoV-2 RNA is generally detectable in upper respiratory specimens during the acute phase of infection. The lowest concentration of SARS-CoV-2 viral copies this assay can detect is 138 copies/mL. A negative result does not preclude SARS-Cov-2 infection and should not be used as the sole basis for treatment or other patient management decisions. A negative result may  occur with  improper specimen collection/handling, submission of specimen other than nasopharyngeal swab, presence of viral mutation(s) within the areas targeted by this assay, and inadequate number of viral copies(<138 copies/mL). A negative result must be combined with clinical observations, patient history, and epidemiological information. The expected result is Negative.  Fact Sheet for Patients:  EntrepreneurPulse.com.au  Fact Sheet for Healthcare Providers:  IncredibleEmployment.be  This test is no t yet approved or cleared by the [place] FDA and  has been authorized for detection and/or diagnosis of SARS-CoV-2 by FDA under an Emergency Use Authorization (EUA). This EUA will remain  in effect (meaning this test can be used) for the duration of the COVID-19 declaration under Section 564(b)(1) of the Act, 21 U.S.C.section 360bbb-3(b)(1), unless the authorization is terminated  or revoked sooner.       Influenza A by PCR NEGATIVE NEGATIVE Final   Influenza B by PCR NEGATIVE NEGATIVE Final    Comment: (NOTE) The Xpert Xpress SARS-CoV-2/FLU/RSV plus assay is intended as an aid in the diagnosis of influenza from Nasopharyngeal swab specimens and should not be used as a sole basis for treatment. Nasal washings and aspirates are unacceptable for Xpert Xpress SARS-CoV-2/FLU/RSV testing.  Fact Sheet for Patients: EntrepreneurPulse.com.au  Fact Sheet for Healthcare Providers: IncredibleEmployment.be  This test is not yet approved or cleared by the [place] FDA and has been authorized for detection and/or diagnosis of SARS-CoV-2 by FDA under an Emergency Use Authorization (EUA). This EUA will remain in effect (meaning this test can be used) for the duration of the COVID-19 declaration under Section 564(b)(1) of the Act, 21 U.S.C. section 360bbb-3(b)(1), unless the authorization is terminated  or revoked.  Performed at [facility], [place] [address], [place], [place] [address]     BNP (last 3 results) Recent Labs    08/09/20 0701  BNP 391.6*    Yaakov Guthrie, MD  [facility] If 7PM-7AM, please contact night-coverage at [email or web address], Office  [phone]   08/13/2020, 8:29 AM  LOS: 3 days

## 2020-08-13 NOTE — Progress Notes (Addendum)
Auth approved [id number] 12/28 - 12/30. Need to add facility.

## 2020-08-13 NOTE — Progress Notes (Signed)
Physical Therapy Treatment Patient Details Name: Kaylee Mullins MRN: [id number] DOB: 01-Sep-1935 Today's Date: 08/13/2020    History of Present Illness Pt is an 84 y.o. female admitted 08/09/20 with generalized weakness and SOB; reports she has not been feeling well since chemo tx last Friday. Workup for acute hypoxic respiratory failure, initially requiring BiPAP. PMH includes CVA, CKD, DM, HTN, SVT, OA, depression, anxiety.    PT Comments    Pt not progressing towards her physical therapy goals; session limited by bowel incontinence and pt fatigue. Pt requiring totalA for peri care (NT assisted). Requiring two person maximal assist for sitting up to edge of bed. Pt fatiguing easily and lying back down after ~2 minutes. Pt demonstrates gross weakness, decreased activity tolerance, cognitive deficits and poor sitting balance. Continue to recommend SNF for ongoing Physical Therapy.      Follow Up Recommendations  SNF;Supervision for mobility/OOB     Equipment Recommendations  3in1 (PT);Wheelchair (measurements PT);Wheelchair cushion (measurements PT)    Recommendations for Other Services       Precautions / Restrictions Precautions Precautions: Fall;Other (comment) Precaution Comments: incontinence Restrictions Weight Bearing Restrictions: No    Mobility  Bed Mobility Overal bed mobility: Needs Assistance Bed Mobility: Rolling;Supine to Sit;Sit to Supine Rolling: Max assist   Supine to sit: Max assist;+2 for physical assistance Sit to supine: Max assist;+2 for physical assistance   General bed mobility comments: Requiring max assist for all aspects of bed mobility, cues for use of rail and initiation  Transfers                 General transfer comment: deferred  Ambulation/Gait                 Stairs             Wheelchair Mobility    Modified Rankin (Stroke Patients Only)       Balance Overall balance assessment: Needs  assistance Sitting-balance support: Feet supported Sitting balance-Leahy Scale: Poor Sitting balance - Comments: left lateral lean with fatigue                                    Cognition Arousal/Alertness: Awake/alert Behavior During Therapy: Flat affect Overall Cognitive Status: Impaired/Different from baseline Area of Impairment: Attention;Following commands;Safety/judgement;Awareness;Problem solving                   Current Attention Level: Focused;Sustained   Following Commands: Follows one step commands with increased time Safety/Judgement: Decreased awareness of deficits;Decreased awareness of safety Awareness: Emergent Problem Solving: Slow processing;Decreased initiation;Difficulty sequencing;Requires verbal cues        Exercises General Exercises - Lower Extremity Long Arc Quad: Both;5 reps;Seated    General Comments        Pertinent Vitals/Pain Pain Assessment: Faces Faces Pain Scale: Hurts little more Pain Location: "bottom" Pain Descriptors / Indicators: Grimacing;Sore Pain Intervention(s): Monitored during session    Home Living                      Prior Function            PT Goals (current goals can now be found in the care plan section) Acute Rehab PT Goals Patient Stated Goal: to rest Potential to Achieve Goals: Fair Progress towards PT goals: Not progressing toward goals - comment (limited by diarrhea)    Frequency    Min 2X/week  PT Plan Current plan remains appropriate    Co-evaluation              AM-PAC PT "6 Clicks" Mobility   Outcome Measure  Help needed turning from your back to your side while in a flat bed without using bedrails?: Total Help needed moving from lying on your back to sitting on the side of a flat bed without using bedrails?: Total Help needed moving to and from a bed to a chair (including a wheelchair)?: Total Help needed standing up from a chair using your arms  (e.g., wheelchair or bedside chair)?: Total Help needed to walk in hospital room?: Total Help needed climbing 3-5 steps with a railing? : Total 6 Click Score: 6    End of Session   Activity Tolerance: Patient limited by fatigue Patient left: in bed;with call bell/phone within reach;with bed alarm set;with family/visitor present Nurse Communication: Mobility status PT Visit Diagnosis: Other abnormalities of gait and mobility (R26.89);Muscle weakness (generalized) (M62.81)     Time: [id number] PT Time Calculation (min) (ACUTE ONLY): 18 min  Charges:  $Therapeutic Activity: 8-22 mins                     Wyona Almas, PT, DPT Acute Rehabilitation Services Pager [phone] Office [phone]    Deno Etienne 08/13/2020, 11:16 AM

## 2020-08-13 NOTE — Progress Notes (Signed)
IP PROGRESS NOTE  Subjective:   Kaylee Mullins is well-known to me with a history of metastatic ampullary carcinoma.  She is currently being treated with gemcitabine/Abraxane chemotherapy, last given on 08/02/2020.  She presented to the emergency room on 08/09/2020 with dyspnea.  She was admitted for further evaluation.  She was treated for CHF.  Lasix was discontinued when her renal function worsened.  The liver enzymes are elevated.  She had an abdominal ultrasound earlier today which confirmed liver lesions consistent with metastases.  There are also changes of cirrhosis.  The bedside RN reports [name] received 2 oxycodone tablets earlier today for relief of pain.  She has received no other sedating medications.  Objective: Vital signs in last 24 hours: Blood pressure (!) 127/54, pulse 66, temperature 98.7 F (37.1 C), temperature source Oral, resp. rate (!) 22, height 5\' 4"  (1.626 m), weight 181 lb 14.1 oz (82.5 kg), SpO2 98 %.  Intake/Output from previous day: 12/27 0701 - 12/28 0700 In: 1189.4 [P.O.:480; I.V.:709.4] Out: 550 [Urine:550]  Physical Exam:  HEENT: The face is symmetric Lungs: Clear anteriorly, no respiratory distress Cardiac: Regular rate and rhythm Abdomen: Soft, no hepatosplenomegaly, nontender Extremities: No leg edema Neurologic: Lethargic, arousable with sternal rub, the pupils are small, follows some simple commands, moves both feet and the left hand to command.  Minimal shrug of the right shoulder, otherwise no movement in the right arm or hand.  No withdrawal at the right hand to pain.  Portacath/PICC-without erythema  Lab Results: Recent Labs    08/11/20 0421 08/12/20 0316  WBC 13.0* 15.3*  HGB 9.4* 8.9*  HCT 29.1* 28.2*  PLT 221 220    BMET Recent Labs    08/13/20 0330 08/13/20 1611  NA 138 138  K 5.5* 4.3  CL 102 102  CO2 24 26  GLUCOSE 157* 132*  BUN 44* 46*  CREATININE 2.24* 2.22*  CALCIUM 7.5* 7.3*    No results found for:  CEA1  Studies/Results: 08/15/20 Abdomen Limited RUQ (LIVER/GB)  Result Date: 08/13/2020 CLINICAL DATA:  Elevated liver enzymes. History of pancreatic carcinoma EXAM: ULTRASOUND ABDOMEN LIMITED RIGHT UPPER QUADRANT COMPARISON:  CT abdomen and pelvis April 30, 2020 FINDINGS: Gallbladder: Surgically absent. Common bile duct: Diameter: 8 mm, within normal limits for post cholecystectomy state. No appreciable intrahepatic, common hepatic, or common bile duct dilatation. Liver: The liver has an overall increased, inhomogeneous echotexture with nodular contour. Complex partially cystic area arising in the anterior right lobe of the liver measures 5.9 x 4.4 x 6.7 cm. A complex mass in the anterior segment right lobe of the liver measures 3.5 x 2.5 x 4.0 cm. Smaller masslike areas are noted elsewhere throughout the liver, consistent with metastatic foci. Portal vein is patent on color Doppler imaging with normal direction of blood flow towards the liver. Other: None. IMPRESSION: 1. Liver has a nodular contour suggesting underlying cirrhosis. Multiple liver masses likely represent metastatic foci from known pancreatic carcinoma. Degenerating cystic lesion in the anterior segment right lobe of the liver potentially may represent residua from treated metastasis versus infected cystic area. Correlation with CT to assess for possible developing abscess in this area of the liver may be reasonable. 2.  Gallbladder absent. These results will be called to the ordering clinician or representative by the Radiologist Assistant, and communication documented in the PACS or May 02, 2020. Electronically Signed   By: [employer] M.D.   On: 08/13/2020 10:34    Medications: I have reviewed the patient's  current medications.  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 CT abdomen/pelvis 10/23/2016-ampullary mass, single  mildly enlarged porta hepatis lymph node, no evidence of distant metastatic disease ? Status post pancreaticoduodenectomy 01/12/2017; pT3pN2 ? CT abdomen/pelvis 03/30/2018-ablation defect within the upper pole of the right kidney. No evidence of recurrent renal mass. New mild left periaortic retroperitoneal lymphadenopathy. ? CT abdomen/pelvis 06/30/2018-6 mm right middle lobe nodule-slowly growing over multiple CTs, stable ablation defect in the right kidney, 1.9 cm left periaortic node increased from 1.2 cm, stable 1.5 cm porta hepatis node ? CT abdomen/pelvis 09/17/2018-surgical changes related to Whipple procedure. Stable upper abdominal/retroperitoneal lymphadenopathy. 5 mm right middle lobe nodule, stable from most recent CT but mildly progressive from priors. Postprocedural changes related to renal ablation in the medial right upper kidney. ? CTs 03/24/2019-a few scattered pulmonary nodules appears stable; liver has a slightly shrunken appearance and nodular appearance compatible with mild cirrhosis. No suspicious cystic or solid hepatic lesions. Upper abdominal and retroperitoneal lymphadenopathy appears similar compared to the prior study. No other new lymphadenopathy noted elsewhere in the abdomen or pelvis. ? PET scan 07/03/2019-hypermetabolic lymph node adjacent porta hepatis. Newfocal hypermetabolic lesion in the liver. Hypermetabolic activity in the left suprarenal location favored metastatic adenopathy in the retroperitoneum. Diffuse hypermetabolic activity within the stomach favor gastritis. ? Cycle 1 gemcitabine/Abraxane 08/31/2019 ? Cycle 2 gemcitabine/Abraxane 09/14/2019 ? Cycle 3 gemcitabine/Abraxane 09/29/2019 ? Cycle 4 gemcitabine/Abraxane 10/13/2019 ? Cycle 5 gemcitabine/Abraxane 10/27/2019 ? Cycle 6 gemcitabine/Abraxane 11/09/2019 ? CTs 11/13/2019-new 2 mm right apical and 3 mm left lower lobe nodules, stable portacaval node, enlargement of amorphous soft tissue encasing the  left renal artery, stable liver lesion ? Cycle 7 gemcitabine/Abraxane 11/24/2019 ? Cycle 8 gemcitabine/Abraxane 12/07/2019 ? 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 Cycle 10 gemcitabine/Abraxane 01/05/2020 ? Cycle 11 gemcitabine/Abraxane 01/18/2020 ? CTs 01/30/2020-small right pleural effusion. Rounded peripheral mass along the margin of the right lower lobe posteriorly adjacent to the pleural effusion 3.0 x 1.9 cm compared to previous measurement of 3.0 x 1.9 cm. Stable 4 mm right upper lobe nodule. Stable 0.6 x 0.5 cm right middle lobe nodule. Stable 3 mm nodule in the superior segment left lower lobe. No new nodules identified. Reduced conspicuity of margins of the moderately heterogeneous anterior liver lesion measuring 5.2 x 2.5 x 4.4 cm, previous measurement 3.0 x 2.4 x 3.4 cm. Nodular contour of the liver. Region of hypodensity in segment 5 measuring 2.6 x 1.8 cm, previously 2.0 x 1.3 cm. Portacaval node 1.2 cm, previously 1.3 cm. Porta hepatis node 0.9 cm, formerly 1.3 cm. Abnormal indistinctly marginated soft tissue density probably reflecting nodal tissue surrounding the left proximal renal artery measuring 1.8 cm, formerly 1.9 cm. ? Cycle 12 gemcitabine/Abraxane 02/01/2020 ? Cycle13gemcitabine/Abraxane 02/16/2020 ? Cycle 14 gemcitabine/Abraxane 02/29/2020 ? 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 Cycle 16 gemcitabine 04/05/2020 (Abraxane held due to neuropathy) ? Cycle 17 gemcitabine 04/18/2020 (Abraxane held due to neuropathy) ? CTs 04/30/2020-previously noted pleural-based masslike lesion in the posterior aspect of the right lower lobe with slow increase in size over numerous prior examinations. Slight regression of multiple liver metastases. No new liver lesions noted. Stable left para-aortic lymphadenopathy. ? Continue every 2-week gemcitabine ? Cycle 18 gemcitabine 05/02/2020 (Abraxane held due to neuropathy) ? Cycle  19gemcitabine 05/16/2020 ? Cycle 20 gemcitabine/Abraxane 05/30/2020 ? Cycle 21 gemcitabine/Abraxane 06/14/2020 ? Cycle 22 gemcitabine/Abraxane 06/27/2020 ? Cycle 23  gemcitabine/Abraxane 07/18/2020 ? Cycle 24 gemcitabine/Abraxane 08/02/2020 2. Biliary obstruction secondary to #1, status post placement of a metal, bile duct stent on 10/23/2016  3. 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 Renal cell carcinoma-status post ablation of a right renal mass 07/15/2016, biopsy confirmed papillary renal cell carcinoma, Fuhrman grade 3  5. CVA in 2016  6. History of thyroid cancer-status post thyroidectomy and radioactive iodine in 2005  7. Diabetes  8.Hypertension  9.Left lower extremity deep vein thrombosis June 2019, IVC filter placed July 2018 after she was diagnosed with a rectus hematoma while on Lovenox  10. Hospitalized 04/12/2019 through 04/14/2019 with traumatic hematoma right lower leg, followed at the wound clinic  11.Port-A-Cath placement interventional radiology 08/29/2019  12.Covid vaccine-second injection administered 11/10/2019; booster 04/05/2020  13.  Resolving right buttock rash 08/02/2020-likely a zoster rash  14.  Admission 08/09/2020 with dyspnea  15.  Acute renal failure  16.  Altered mental status-etiology unclear  Ms. Bourbeau is well-known to me with a history of metastatic ampullary carcinoma, currently being treated with gemcitabine/Abraxane chemotherapy.  She was admitted with dyspnea, and now has altered mental status.  The etiology of the altered mental status is unclear.  She is very different than baseline.  The differential diagnosis includes a CVA, development of brain metastases, an infection, hepatic encephalopathy, and there may be a component from renal failure.  I doubt the oxycodone given earlier today explained her current mental status.  Ms. Fernicola has an incurable malignancy, but has  been stable while maintained on systemic therapy for the past year.  Her family is not present this evening.  I will discuss CODE STATUS and disposition plans with them when they are available.  Recommendation:  1.  Evaluation for altered mental status-consider CNS imaging, ammonia level, urinalysis 2.  Hold sedating medications 3.  CT abdomen/pelvis for restaging of ampullary carcinoma if the renal function improves 4.  Discussions with patient and family regarding CODE STATUS and disposition plans, I asked the floor RN to request her son be present when I see Ms. Heaslip early tomorrow     LOS: 3 days   Betsy Coder, MD   08/13/2020, 5:52 PM

## 2020-08-13 NOTE — Progress Notes (Signed)
Nutrition Follow-up  DOCUMENTATION CODES:   Obesity unspecified  INTERVENTION:   -Continue Glucerna Shake po TID, each supplement provides 220 kcal and 10 grams of protein -Continue Magic cup TID with meals, each supplement provides 290 kcal and 9 grams of protein -Continue MVI with minerals daily  NUTRITION DIAGNOSIS:   Increased nutrient needs related to cancer and cancer related treatments as evidenced by estimated needs.  Ongoing  GOAL:   Patient will meet greater than or equal to 90% of their needs  Progressing   MONITOR:   PO intake,Supplement acceptance,Weight trends,Labs,I & O's  REASON FOR ASSESSMENT:   Malnutrition Screening Tool    ASSESSMENT:   Pt admitted with dyspnea due to acute on chronic respiratory failure. 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  Reviewed I/O's: +693 ml x 24 hours and +439 ml since admission  UOP: 500 ml x 24 hours  Attempted to speak with pt via call to hospital room phone, however, unable to reach.   Pt's intake has improved. Noted meal completion 50-100%. Pt is consuming Glucerna supplements.   Medications reviewed and include colace and 0.9% sodium chloride infusion @ 75 ml/hr.  Per RN notes, abdomen ultrasound results revealed liver cirrhosis, liver masses, and cystic legion.   Per TOC notes, pt with insurance authorization for SNF. Awaiting facility approval and medical stability.   Labs reviewed: K: 5.5, CBGS: 150-157 (inpatient orders for glycemic control are 0-9 units insulin aspart TID with meals and 7 units insulin detemir BID).   Diet Order:   Diet Order            Diet heart healthy/carb modified Room service appropriate? Yes; Fluid consistency: Thin  Diet effective now                 EDUCATION NEEDS:   No education needs have been identified at this time  Skin:  Skin Assessment:  Reviewed RN Assessment  Last BM:  08/13/20  Height:   Ht Readings from Last 1 Encounters:  08/09/20 5\' 4"  (1.626 m)    Weight:   Wt Readings from Last 1 Encounters:  08/13/20 82.5 kg    Ideal Body Weight:  54.5 kg  BMI:  Body mass index is 31.22 kg/m.  Estimated Nutritional Needs:   Kcal:  1700-1900  Protein:  85-100 grams  Fluid:  >1.7L/d    08/15/20, RD, LDN, CDCES Registered Dietitian II Certified Diabetes Care and Education Specialist Please refer to [facility] for RD and/or RD on-call/weekend/after hours pager

## 2020-08-13 NOTE — Progress Notes (Signed)
Spoke to son, Arlys John about Truett Perna MD request to see the family member before shift change tomorrow [phone].Son verbalize he will be in attendance at that time. Will let NS and oncoming RN know.

## 2020-08-13 NOTE — Progress Notes (Signed)
ANTICOAGULATION CONSULT NOTE Pharmacy Consult for warfarin Indication: VTE treatment  Allergies  Allergen Reactions  . Hydralazine Hcl Shortness Of Breath    Pt had severe respiratory distress after receiving a dose  . Amlodipine Besylate Swelling  . Darvon [Propoxyphene Hcl] Other (See Comments)    Hallucinations   . Nyquil Multi-Symptom [Pseudoeph-Doxylamine-Dm-Apap] Other (See Comments)    Makes pt not "feel right in her head"  . Metformin And Related Diarrhea    Labs: Recent Labs    08/11/20 0421 08/12/20 0316 08/13/20 0330  HGB 9.4* 8.9*  --   HCT 29.1* 28.2*  --   PLT 221 220  --   LABPROT 37.2* 33.6* 28.2*  INR 3.9* 3.5* 2.8*  CREATININE 1.82* 2.21* 2.24*    Estimated Creatinine Clearance: 20.1 mL/min (A) (by C-G formula based on SCr of 2.24 mg/dL (H)).  Assessment: 84 yo female who presented with SOB. PTA the patient is on warfarin at home for hx of DVT. Of note, the patient has a history of rectus sheath hematoma while on enoxaparin and had and IVC filter placed.  INR upon admission was supratherapeutic at 3.7. The last reported dose of warfarin was on 12/23 @ 1900. Pharmacy is consulted to dose warfarin.     - PTA warfarin dose: : 7.5 mg PO daily   INR trended back down today to 2.8  Goal of Therapy:  INR 2-3 Monitor platelets by anticoagulation protocol: Yes   Plan:  -Warfarin 5 mg po x 1 today - Obtain daily PT/INR and CBC - Monitor closely for signs and symptoms of bleeding   Thank you Okey Regal, PharmD  08/13/2020 10:01 AM  Please check [email or web address] for unit-specific pharmacy phone numbers.

## 2020-08-13 NOTE — Progress Notes (Signed)
Patient continues to be lethargic at shift change.  MD requests U/A, ammonia levels, and MRI of brain without contrast.  Orders placed.  During patient assessment, patient able to answer question about name but too lethargic to answer anything further.  VSS and CBG 103.  Requested RR RN to see patient.  Nothing additional at this time until MRI complete.   2230-pt still lethargic and able to answer minimal questions before falling back asleep.  Unable to give oral meds.  MD notified.  MD okay to hold levemir due to CBG of 95 and no food intake this evening. MRI still unable to get to patient at this time.

## 2020-08-13 NOTE — Progress Notes (Signed)
[place] called and RN received results from abdomen US. Results were read as liver cirrhosis, liver masses, and a cystic legion. Paged MD to notify.

## 2020-08-13 NOTE — Progress Notes (Signed)
Patient have been drowsy all day but VS have been stable and CBG have been stable. Patient is more arousable and stayed to have a conversation with me after the CHG bath. Paged MD to notify. Sherrill MD was at the bedside when patient was drowsy - arousable after sternal rub. Will continue to monitor.

## 2020-08-14 ENCOUNTER — Inpatient Hospital Stay (HOSPITAL_COMMUNITY): Payer: Medicare Other

## 2020-08-14 DIAGNOSIS — N390 Urinary tract infection, site not specified: Secondary | ICD-10-CM | POA: Diagnosis not present

## 2020-08-14 DIAGNOSIS — C241 Malignant neoplasm of ampulla of Vater: Secondary | ICD-10-CM | POA: Diagnosis not present

## 2020-08-14 DIAGNOSIS — R4182 Altered mental status, unspecified: Secondary | ICD-10-CM | POA: Diagnosis not present

## 2020-08-14 DIAGNOSIS — C787 Secondary malignant neoplasm of liver and intrahepatic bile duct: Secondary | ICD-10-CM | POA: Diagnosis not present

## 2020-08-14 DIAGNOSIS — I639 Cerebral infarction, unspecified: Secondary | ICD-10-CM | POA: Diagnosis not present

## 2020-08-14 DIAGNOSIS — I5033 Acute on chronic diastolic (congestive) heart failure: Secondary | ICD-10-CM | POA: Diagnosis not present

## 2020-08-14 DIAGNOSIS — R7989 Other specified abnormal findings of blood chemistry: Secondary | ICD-10-CM | POA: Diagnosis not present

## 2020-08-14 LAB — CBC WITH DIFFERENTIAL/PLATELET
Abs Immature Granulocytes: 0.83 10*3/uL — ABNORMAL HIGH (ref 0.00–0.07)
Basophils Absolute: 0.1 10*3/uL (ref 0.0–0.1)
Basophils Relative: 0 %
Eosinophils Absolute: 0 10*3/uL (ref 0.0–0.5)
Eosinophils Relative: 0 %
HCT: 29.2 % — ABNORMAL LOW (ref 36.0–46.0)
Hemoglobin: 9.6 g/dL — ABNORMAL LOW (ref 12.0–15.0)
Immature Granulocytes: 4 %
Lymphocytes Relative: 7 %
Lymphs Abs: 1.7 10*3/uL (ref 0.7–4.0)
MCH: 30.9 pg (ref 26.0–34.0)
MCHC: 32.9 g/dL (ref 30.0–36.0)
MCV: 93.9 fL (ref 80.0–100.0)
Monocytes Absolute: 2.2 10*3/uL — ABNORMAL HIGH (ref 0.1–1.0)
Monocytes Relative: 10 %
Neutro Abs: 18 10*3/uL — ABNORMAL HIGH (ref 1.7–7.7)
Neutrophils Relative %: 79 %
Platelets: 339 10*3/uL (ref 150–400)
RBC: 3.11 MIL/uL — ABNORMAL LOW (ref 3.87–5.11)
RDW: 18.4 % — ABNORMAL HIGH (ref 11.5–15.5)
WBC: 22.8 10*3/uL — ABNORMAL HIGH (ref 4.0–10.5)
nRBC: 0.1 % (ref 0.0–0.2)

## 2020-08-14 LAB — GLUCOSE, CAPILLARY
Glucose-Capillary: 95 mg/dL (ref 70–99)
Glucose-Capillary: 88 mg/dL (ref 70–99)
Glucose-Capillary: 195 mg/dL — ABNORMAL HIGH (ref 70–99)
Glucose-Capillary: 156 mg/dL — ABNORMAL HIGH (ref 70–99)
Glucose-Capillary: 196 mg/dL — ABNORMAL HIGH (ref 70–99)

## 2020-08-14 LAB — URINALYSIS, ROUTINE W REFLEX MICROSCOPIC
Bilirubin Urine: NEGATIVE
Glucose, UA: NEGATIVE mg/dL
Ketones, ur: NEGATIVE mg/dL
Nitrite: NEGATIVE
Protein, ur: 30 mg/dL — AB
Specific Gravity, Urine: 1.013 (ref 1.005–1.030)
WBC, UA: >50 WBC/hpf — ABNORMAL HIGH (ref 0–5)
pH: 5 (ref 5.0–8.0)

## 2020-08-14 LAB — BASIC METABOLIC PANEL
Anion gap: 11 (ref 5–15)
BUN: 48 mg/dL — ABNORMAL HIGH (ref 8–23)
CO2: 26 mmol/L (ref 22–32)
Calcium: 7.5 mg/dL — ABNORMAL LOW (ref 8.9–10.3)
Chloride: 107 mmol/L (ref 98–111)
Creatinine, Ser: 2.24 mg/dL — ABNORMAL HIGH (ref 0.44–1.00)
GFR, Estimated: 21 mL/min — ABNORMAL LOW (ref >60)
Glucose, Bld: 96 mg/dL (ref 70–99)
Potassium: 4.3 mmol/L (ref 3.5–5.1)
Sodium: 144 mmol/L (ref 135–145)

## 2020-08-14 LAB — PROTIME-INR
INR: 2.8 — ABNORMAL HIGH (ref 0.8–1.2)
Prothrombin Time: 28.4 seconds — ABNORMAL HIGH (ref 11.4–15.2)

## 2020-08-14 MED ORDER — SODIUM CHLORIDE 0.9 % IV SOLN: INTRAVENOUS | Status: AC

## 2020-08-14 MED ORDER — SODIUM CHLORIDE 0.9 % IV SOLN
1.0000 g | INTRAVENOUS | Status: DC
  Administered 2020-08-14 – 2020-08-17 (×4): 1 g via INTRAVENOUS
  Filled 2020-08-14 (×4): qty 10

## 2020-08-14 MED ORDER — ACETAMINOPHEN 325 MG PO TABS
650.0000 mg | ORAL_TABLET | Freq: Four times a day (QID) | ORAL | Status: DC | PRN
  Administered 2020-08-14 – 2020-08-19 (×3): 650 mg via ORAL
  Filled 2020-08-14 (×3): qty 2

## 2020-08-14 MED ORDER — STROKE: EARLY STAGES OF RECOVERY BOOK
Freq: Once | Status: AC
  Administered 2020-08-14: 17:00:00 1
  Filled 2020-08-14: qty 1

## 2020-08-14 MED ORDER — WARFARIN SODIUM 2 MG PO TABS
4.0000 mg | ORAL_TABLET | Freq: Once | ORAL | Status: AC
  Administered 2020-08-14: 17:00:00 4 mg via ORAL
  Filled 2020-08-14: qty 2

## 2020-08-14 NOTE — Consult Note (Signed)
NEURO HOSPITALIST CONSULT NOTE   Requestig physician: Dr. Aileen Fass  Reason for Consult: MRI brain findings of numerous scattered small acute infarcts in both cerebral hemispheres  History obtained from:  Patient family at bedisde, chart review. Patient is an unreliable source of information at this time due to acute mental status change  HPI:                                                                                                                                          Kaylee Mullins is an 84 y.o. 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 She had been complaining of progressive generalized weakness, lower extremity edema, and shortness of breath since her last chemotherapy. She was found to be hypoxic requiring BiPAP and shortly after was weaned to room air.  During her hospital stay, she was noted to be drowsy but arousable12/28 following the administration of pain medication and subsequently had an MRI ordered 12/29 for progressive lethargy and acute mental status change. MRI revealed multifocal bilateral patchy acute infarctions of the cerebral hemispheres bilaterally. Family notes increased drowsiness and confusion with a decreased appetite since 08/06/2020  At baseline, patient is able to ambulate to the restroom with a cane or a walker. She has a chair lift in her home to help her up the stairs. She is able to communicate her needs effectively with her family and only sometimes calls her family members the wrong names but quickly corrects herself.  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   Cancer [facility])    thyroid cancer- surgery and radiation   Cerebral infarction due to unspecified mechanism    Chronic kidney disease    questionable mass on kidney. Being followed by Dr Diona Fanti   Complication of anesthesia    heart rate was really low   CVA (cerebral infarction) 09/11/2014   Depression    Diabetes mellitus    type 2   Diabetes mellitus without complication ([place]) [id number]   Qualifier: Diagnosis of  By: Loanne Drilling MD, Hilliard Clark A    Dyslipidemia 04/20/2007   Qualifier: Diagnosis of  By: Loanne Drilling MD, Hilliard Clark A    Full dentures  Gastroparesis 02/06/2017   GERD (gastroesophageal reflux disease) 04/03/2017   History of CVA (cerebral vascular accident) ([place]) 09/11/2014   Hypertension    Hypokalemia 08/25/2017   Hypothyroidism    Lumbar stenosis with neurogenic claudication 02/10/2016   Osteoarthritis 09/11/2014   Pneumonia    Right renal mass 09/13/2014   Spinal stenosis    Stroke ([place]) 09/2014   left sided weakness   SVT (supraventricular tachycardia) ([place]) 07/30/2017   Past Surgical History:  Procedure Laterality Date   ABDOMINAL HYSTERECTOMY     partial   cataracts     Removed  11/2015  bilateral   COLONOSCOPY W/ POLYPECTOMY     ERCP N/A 05/07/2016   Procedure: ENDOSCOPIC RETROGRADE CHOLANGIOPANCREATOGRAPHY (ERCP);  Surgeon: Clarene Essex, MD;  Location: [facility] ENDOSCOPY;  Service: Endoscopy;  Laterality: N/A;   ERCP N/A 10/16/2016   Procedure: ENDOSCOPIC RETROGRADE CHOLANGIOPANCREATOGRAPHY (ERCP);  Surgeon: Clarene Essex, MD;  Location: [facility] ENDOSCOPY;  Service: Endoscopy;  Laterality: N/A;   ESOPHAGOGASTRODUODENOSCOPY N/A 02/10/2017   Procedure: ESOPHAGOGASTRODUODENOSCOPY (EGD);  Surgeon: Teena Irani, MD;  Location: [facility] ENDOSCOPY;  Service: Endoscopy;   Laterality: N/A;   EUS N/A 05/27/2016   Procedure: ESOPHAGEAL ENDOSCOPIC ULTRASOUND (EUS) RADIAL;  Surgeon: Arta Silence, MD;  Location: [facility] ENDOSCOPY;  Service: Endoscopy;  Laterality: N/A;   FLEXIBLE SIGMOIDOSCOPY  03/29/2012   Procedure: FLEXIBLE SIGMOIDOSCOPY;  Surgeon: Jeryl Columbia, MD;  Location: [facility] ENDOSCOPY;  Service: Endoscopy;  Laterality: N/A;  fleet enema upon arrival   [place]  03/29/2012   Procedure: HOT HEMOSTASIS (ARGON PLASMA COAGULATION/BICAP);  Surgeon: Jeryl Columbia, MD;  Location: [facility] ENDOSCOPY;  Service: Endoscopy;  Laterality: N/A;   IR GASTR TUBE CONVERT GASTR-JEJ PER W/FL MOD SED  02/23/2017   IR GASTROSTOMY TUBE MOD SED  02/16/2017   IR GENERIC HISTORICAL  06/30/2016   IR RADIOLOGIST EVAL & MGMT 06/30/2016 Aletta Edouard, MD [facility] INTERV RAD   IR GENERIC HISTORICAL  09/09/2016   IR RADIOLOGIST EVAL & MGMT 09/09/2016 Aletta Edouard, MD [facility] INTERV RAD   IR IMAGING GUIDED PORT INSERTION  08/29/2019   IR IVC FILTER PLMT / S&I /IMG GUID/MOD SED  03/03/2017   IR PATIENT EVAL TECH 0-60 MINS  05/12/2017   IR RADIOLOGIST EVAL & MGMT  11/24/2017   IR RADIOLOGIST EVAL & MGMT  04/07/2018   IR RADIOLOGIST EVAL & MGMT  05/18/2019   LAPAROSCOPY N/A 01/12/2017   Procedure: LAPAROSCOPY DIAGNOSTIC;  Surgeon: Stark Klein, MD;  Location: [place];  Service: General;  Laterality: N/A;   LAPAROTOMY N/A 03/10/2017   Procedure: EXPLORATORY LAPAROTOMY Open jejunostomy tube;  Surgeon: Stark Klein, MD;  Location: [place];  Service: General;  Laterality: N/A;   LUMBAR LAMINECTOMY/DECOMPRESSION MICRODISCECTOMY N/A 02/10/2016   Procedure: Lumbar three- four Laminectomy;  Surgeon: Kristeen Miss, MD;  Location: [place] NEURO ORS;  Service: Neurosurgery;  Laterality: N/A;  L3-4 Laminectomy   LUMBAR SPINE SURGERY     1st surgery "ray cage placed"   THYROIDECTOMY  2005   TONSILLECTOMY     WHIPPLE PROCEDURE N/A 01/12/2017   Procedure: WHIPPLE PROCEDURE;  Surgeon: Stark Klein, MD;   Location: [facility] OR;  Service: General;  Laterality: N/A;   Family History  Problem Relation Age of Onset   Heart failure Mother    Heart attack Father         Social History:  reports that she has never smoked. She has never used smokeless tobacco. She reports current drug use. Drug: Methamphetamines. She reports that she does not drink  alcohol.  Allergies  Allergen Reactions   Hydralazine Hcl Shortness Of Breath    Pt had severe respiratory distress after receiving a dose   Amlodipine Besylate Swelling   Darvon [Propoxyphene Hcl] Other (See Comments)    Hallucinations    Nyquil Multi-Symptom [Pseudoeph-Doxylamine-Dm-Apap] Other (See Comments)    Makes pt not "feel right in her head"   Metformin And Related Diarrhea   MEDICATIONS:                                                                                                                     Current Outpatient Medications  Medication Instructions   acetaminophen (TYLENOL) 500 mg, Oral, Every 6 hours PRN   carvedilol (COREG) 25 mg, Oral, 2 times daily   furosemide (LASIX) 80 MG tablet TAKE 1 TABLET BY MOUTH EVERY DAY ALTERNATING WITH 40MG    gabapentin (NEURONTIN) 100 mg, Oral, Daily   HumaLOG KwikPen 7-10 Units, Subcutaneous, See admin instructions, Three times daily per sliding scale   insulin detemir (LEVEMIR) 7 Units, Subcutaneous, 2 times daily   levothyroxine (SYNTHROID) 200 mcg, Oral, Daily before breakfast, For hypothyroidism   lidocaine-prilocaine (EMLA) cream Apply to port site 1-2 hours prior to use   NIFEdipine (PROCARDIA-XL/NIFEDICAL-XL) 30 mg, Oral, Daily   ondansetron (ZOFRAN) 8 mg, Oral, Every 8 hours PRN   oxyCODONE-acetaminophen (PERCOCET/ROXICET) 5-325 MG tablet 1-2 tablets, Oral, Every 8 hours PRN   pantoprazole (PROTONIX) 40 mg, Oral, Daily at bedtime   Polyethylene Glycol 3350 (MIRALAX PO) 17 g, Oral, Daily PRN   potassium chloride SA (KLOR-CON M20) 20 MEQ tablet Take 3 tablets daily  alternating with 1 tablet daily.  Take with furosemide.   rosuvastatin (CRESTOR) 20 mg, Oral, Daily   Vitamin D, Ergocalciferol, (DRISDOL) 1.25 MG (50000 UNIT) CAPS capsule TAKE 1 CAPSULE BY MOUTH ONE TIME PER WEEK   warfarin (COUMADIN) 7.5 mg, Oral, One time only - 1800   ROS:                                                                                                                                       History unobtainable from patient due to mental status and lack of cooperation  Blood pressure (!) 117/56, pulse 73, temperature 98.6 F (37 C), temperature source Oral, resp. rate 20, height 5\' 4"  (1.626 m), weight 82.6 kg, SpO2 99 %.  General Examination:  Physical Exam  HEENT-  Normocephalic, atraumatic   Cardiovascular- bilateral lower extremity edema 3+ nonpitting Lungs- No excessive working breathing.   Abdomen- soft, non-distended Extremities- Warm Musculoskeletal-no joint tenderness noted Skin: WDI  Neurological Examination Mental Status: Lethargic, arouses to voice but quickly loses focus and drifts to sleep again. Oriented to age and place, not oriented to situation or month. States that it is January with some lexical perseveration. When asked what brought her to the hospital she states "I think I had a stroke" multiple times and drifts off. Repetition and comprehension intact. Naming intermittently intact, sometimes perseverates and has trouble with word finding with some evidence of echolalia. Patient is aphasic. No dysarthria noted. Unable to follow 2-step commands without constant coaching/repetition.  No signs of neglect noted.  Cranial Nerves:  II: Visual fields grossly normal. Able to track examiner with persistent coaching. III,IV, VI: EOMI, requires stimulation and repetitive coaching to have patient look in all visual fields. PERRL 61mm and brisk bilaterally. She  has a tendency not to directly fixate on visual stimuli but will identify objects intermittently in her peripheral vision.  V,VII: Face symmetric resting and crying, puffs cheeks and raises eyebrows symmetrically. Face sensation unable to be assessed when asked if the sensation is equal bilaterally she consistently repeats "no" and when she is asked to elaborate she falls asleep, does not respond, or states "it is cool". Not able to communicate differentiation in sensation right to left.  VIII: Slightly hard of hearing IX,X: Phonation intact XI: Bilateral shoulder shrug unable to be assessed; patient does not follow all commands XII: Tongue protrudes midline Motor: Right : Upper extremity   3/5    Left:     Upper extremity   4/5  Lower extremity   2/5     Lower extremity   2/5 With repeated coaching, left arm without pronator drift, LUE stronger than RUE, grip strength, push/pull equal bilaterally, right upper extremity with anti-gravity movement but with drift to bed with each attempt. Assessment limited by patient understanding of command and decreased level attention. She is able to wiggle her toes on each foot to command and is able to move feet at the ankle level but claims she is unable to spontaneously move either lower extremity.  Sensory:  Unable to assess symmetry of sensation. Patient is able to verify feeling light touch, and cool temperature in each extremity but unable to compare left to right. Deep Tendon Reflexes: 1+ and symmetric patellae, 2+ biceps  Plantars: Right: downgoing   Left: downgoing Cerebellar: Unable to assess, patient is able to touch her nose with each pointer finger but does not participate to extend her arm and touch the examiner's finger with repeated coaching. She does not attempt to look for examiner's finger when asked repeatedly.  Gait: unable to assess  1a Level of Conscious.: 1 1b LOC Questions: 1 1c LOC Commands: 1 2 Best Gaze: 0 3 Visual: 0 4 Facial  Palsy: 0 5a Motor Arm - left: 0 5b Motor Arm - Right: 2 6a Motor Leg - Left: 3 (likely unable to move due to deconditioning and significant edema, not due to stroke, has not been able to ambulate independently since 12/20) 6b Motor Leg - Right: 3  (likely unable to move due to deconditioning and significant edema, not due to stroke, has not been able to ambulate independently since 12/20) 7 Limb Ataxia: 0 8 Sensory: 0 9 Best Language: 1 10 Dysarthria: 0 11 Extinct. and Inatten.: 0 TOTAL:  12   Lab Results: Basic Metabolic Panel: Recent Labs  Lab 08/11/20 0421 08/12/20 0316 08/13/20 0330 08/13/20 1611 08/14/20 0357  NA 140 138 138 138 144  K 3.2* 3.3* 5.5* 4.3 4.3  CL 99 100 102 102 107  CO2 30 28 24 26 26   GLUCOSE 175* 116* 157* 132* 96  BUN 32* 38* 44* 46* 48*  CREATININE 1.82* 2.21* 2.24* 2.22* 2.24*  CALCIUM 7.8* 7.6* 7.5* 7.3* 7.5*  MG  --  2.1  --   --   --     CBC: Recent Labs  Lab 08/09/20 0700 08/10/20 0227 08/11/20 0421 08/12/20 0316 08/14/20 0357  WBC 9.9 11.7* 13.0* 15.3* 22.8*  NEUTROABS 8.3*  --   --   --  18.0*  HGB 9.3* 8.9* 9.4* 8.9* 9.6*  HCT 28.5* 28.0* 29.1* 28.2* 29.2*  MCV 95.3 93.3 93.0 93.4 93.9  PLT 153 191 221 220 339    Cardiac Enzymes: No results for input(s): CKTOTAL, CKMB, CKMBINDEX, TROPONINI in the last 168 hours.  Lipid Panel: No results for input(s): CHOL, TRIG, HDL, CHOLHDL, VLDL, LDLCALC in the last 168 hours.  Imaging: MR BRAIN WO CONTRAST  Result Date: 08/14/2020 CLINICAL DATA:  84 year old female with altered mental status and lethargy. Recent shortness of breath. History of pancreatic cancer. EXAM: MRI HEAD WITHOUT CONTRAST TECHNIQUE: Multiplanar, multiecho pulse sequences of the brain and surrounding structures were obtained without intravenous contrast. COMPARISON:  Brain MRI and intracranial MRA 09/16/2014 FINDINGS: Brain: Widely scattered small foci of restricted diffusion in both frontal lobes, posterior temporal  lobes, occasionally the occipital lobe white matter. White matter mostly affected. Deep gray nuclei, brainstem, and cerebellum are spared. No associated acute hemorrhage or mass effect. Superimposed chronic patchy bilateral cerebral white matter and bilateral deep gray nuclei T2 and FLAIR heterogeneity is largely stable since 2016. Expected evolution of chronic lacunar infarct of the right thalamus since 2016. Progressed chronic microhemorrhages in the bilateral thalami over the same time. Chronic lacunar infarct in the left pons is stable. Small chronic calcification along the medial right tentorium is stable. No signal abnormality specific for vasogenic edema. No IV contrast administered. No midline shift, mass effect, evidence of mass lesion, ventriculomegaly, extra-axial collection or acute intracranial hemorrhage. Cervicomedullary junction and pituitary are within normal limits. Vascular: Major intracranial vascular flow voids are stable since 2016. Generalized intracranial artery tortuosity. Skull and upper cervical spine: Stable bone marrow signal since 2016. Chronic hyperostosis of the calvarium. Negative for age visible cervical spine. Sinuses/Orbits: Postoperative changes to both globes since 2016. Paranasal Visualized paranasal sinuses and mastoids are stable and well pneumatized. Other: Grossly normal visible internal auditory structures. Negative visible scalp and face soft tissues. IMPRESSION: 1. Numerous scattered small acute infarcts in both cerebral hemispheres, primarily affecting white matter of the MCA territories. No associated hemorrhage or mass effect. Consider recent embolic event. A hypotensive episode might also have this appearance. 2. Underlying advanced chronic small vessel disease, with some progression since 2016. 3. No strong evidence of metastatic disease on this noncontrast exam. Electronically Signed   By: Genevie Ann M.D.   On: 08/14/2020 06:19   US Abdomen Limited RUQ  (LIVER/GB)  Result Date: 08/13/2020 CLINICAL DATA:  Elevated liver enzymes. History of pancreatic carcinoma EXAM: ULTRASOUND ABDOMEN LIMITED RIGHT UPPER QUADRANT COMPARISON:  CT abdomen and pelvis April 30, 2020 FINDINGS: Gallbladder: Surgically absent. Common bile duct: Diameter: 8 mm, within normal limits for post cholecystectomy state. No appreciable intrahepatic, common hepatic, or common bile duct  dilatation. Liver: The liver has an overall increased, inhomogeneous echotexture with nodular contour. Complex partially cystic area arising in the anterior right lobe of the liver measures 5.9 x 4.4 x 6.7 cm. A complex mass in the anterior segment right lobe of the liver measures 3.5 x 2.5 x 4.0 cm. Smaller masslike areas are noted elsewhere throughout the liver, consistent with metastatic foci. Portal vein is patent on color Doppler imaging with normal direction of blood flow towards the liver. Other: None. IMPRESSION: 1. Liver has a nodular contour suggesting underlying cirrhosis. Multiple liver masses likely represent metastatic foci from known pancreatic carcinoma. Degenerating cystic lesion in the anterior segment right lobe of the liver potentially may represent residua from treated metastasis versus infected cystic area. Correlation with CT to assess for possible developing abscess in this area of the liver may be reasonable. 2.  Gallbladder absent. These results will be called to the ordering clinician or representative by the Radiologist Assistant, and communication documented in the PACS or [employer]. Electronically Signed   By: Bretta Bang III M.D.   On: 08/13/2020 10:34   Assessment: 84 year old female with multifocal acute ischemic strokes on MRI with acute mental status change and lethargy. Etiology potentially embolic versus cerebral hypoperfusion from hypotension with hypoxia. The latter is felt to be the most likely etiology based on the MRI appearance[ additionally, per  daughter the patient's SBP usually is in the 180's and recently this admission has been in the 110's to 120's, which is a significant drop for her.  1. Exam reveals acutely altered mental status with lethargy, trouble following commands, and confusion consistent with numerous acute infarcts in bilateral hemispheres. Consider cerebral hypoperfusion due to hypoxemic event leading to hospitalization with blood pressures in the low 100's with typical blood pressure in the 180's systolic versus an embolic etiology. Embolic etiology would be less suspected due to the bilateral watershed nature of the infarcts. 2. MRI brain: Numerous scattered small acute infarcts in both cerebral hemispheres, primarily affecting white matter of the MCA territories. DDx includes recent embolic event and hypotensive episode. There is underlying advanced chronic small vessel disease, with some progression since 2016. Of note, there is no strong evidence of metastatic disease on this noncontrast exam. 3. Stroke risk factors: previous CVA, DVT, history of cancer, current cancer on chemotherapy, CKD, and type 2 DM. 4. Patient on chronic coumadin, therapeutic INR at 2.8 on 12/29.  5. Acute kidney injury with CKD. Unable to obtain a CT angio with contrast at this time. Lasix was tried during the hospital stay with worsening kidney function. Gentle hydration initiated without response in kidney function at this time.   Recommendations: - MRA of the brain without contrast - Frequent neuro checks - Echocardiogram - Carotid dopplers - Goal SBP of 140-160 given her watershed strokes while taking into account her CHF - Prophylactic therapy-  Continue Coumadin - Risk factor modification - Telemetry monitoring - PT consult, OT consult, Speech consult - Stroke team to follow  NP assisting with patient examination: Kathi Simpers [phone]   Electronically signed: Dr. Caryl Pina 08/14/2020, 12:45 PM

## 2020-08-14 NOTE — Progress Notes (Signed)
Unable to perform NIH scale and stroke swallow screening, called RR to perform assessment. Patient's daughter is at the bedside, able to update the patient's daughter with current plan of care and able to answer questions about transfer to a neuro unit.

## 2020-08-14 NOTE — Progress Notes (Signed)
ANTICOAGULATION CONSULT NOTE Pharmacy Consult for warfarin Indication: VTE treatment  Allergies  Allergen Reactions  . Hydralazine Hcl Shortness Of Breath    Pt had severe respiratory distress after receiving a dose  . Amlodipine Besylate Swelling  . Darvon [Propoxyphene Hcl] Other (See Comments)    Hallucinations   . Nyquil Multi-Symptom [Pseudoeph-Doxylamine-Dm-Apap] Other (See Comments)    Makes pt not "feel right in her head"  . Metformin And Related Diarrhea    Labs: Recent Labs    08/12/20 0316 08/13/20 0330 08/13/20 1611 08/14/20 0357  HGB 8.9*  --   --  9.6*  HCT 28.2*  --   --  29.2*  PLT 220  --   --  339  LABPROT 33.6* 28.2*  --  28.4*  INR 3.5* 2.8*  --  2.8*  CREATININE 2.21* 2.24* 2.22* 2.24*    Estimated Creatinine Clearance: 20.1 mL/min (A) (by C-G formula based on SCr of 2.24 mg/dL (H)).  Assessment: 84 yo female who presented with SOB. PTA the patient is on warfarin at home for hx of DVT. Of note, the patient has a history of rectus sheath hematoma while on enoxaparin and had and IVC filter placed.  INR upon admission was supratherapeutic at 3.7. The last reported dose of warfarin was on 12/23 @ 1900. Pharmacy is consulted to dose warfarin.     - PTA warfarin dose: : 7.5 mg PO daily   INR trended back down today to 2.8  Goal of Therapy:  INR 2-3 Monitor platelets by anticoagulation protocol: Yes   Plan:  -Warfarin 4 mg po x 1 today - Obtain daily PT/INR and CBC - Monitor closely for signs and symptoms of bleeding   Thank you Okey Regal, PharmD  08/14/2020 12:49 PM  Please check [email or web address] for unit-specific pharmacy phone numbers.

## 2020-08-14 NOTE — Progress Notes (Signed)
IP PROGRESS NOTE  Subjective:   Ms. Powis appeared comfortable when I saw her at approximately 6:45 this morning.  Her son and daughter were at the bedside.  Objective: Vital signs in last 24 hours: Blood pressure (!) 117/56, pulse 73, temperature 98.6 F (37 C), temperature source Oral, resp. rate 20, height 5\' 4"  (1.626 m), weight 182 lb 1.6 oz (82.6 kg), SpO2 99 %.  Intake/Output from previous day: 12/28 0701 - 12/29 0700 In: 1731.5 [P.O.:240; I.V.:1391.5; IV Piggyback:100] Out: 400 [Urine:400]  Physical Exam:  HEENT: The face is symmetric  Cardiac: Regular rate and rhythm Abdomen: Soft, no hepatosplenomegaly, nontender Extremities: No leg edema Neurologic: Alert, follows commands, moves all extremities to command, speech is fluent  Portacath/PICC-without erythema  Lab Results: Recent Labs    08/12/20 0316 08/14/20 0357  WBC 15.3* 22.8*  HGB 8.9* 9.6*  HCT 28.2* 29.2*  PLT 220 339    BMET Recent Labs    08/13/20 1611 08/14/20 0357  NA 138 144  K 4.3 4.3  CL 102 107  CO2 26 26  GLUCOSE 132* 96  BUN 46* 48*  CREATININE 2.22* 2.24*  CALCIUM 7.3* 7.5*    No results found for: CEA1  Studies/Results: MR BRAIN WO CONTRAST  Result Date: 08/14/2020 CLINICAL DATA:  84 year old female with altered mental status and lethargy. Recent shortness of breath. History of pancreatic cancer. EXAM: MRI HEAD WITHOUT CONTRAST TECHNIQUE: Multiplanar, multiecho pulse sequences of the brain and surrounding structures were obtained without intravenous contrast. COMPARISON:  Brain MRI and intracranial MRA 09/16/2014 FINDINGS: Brain: Widely scattered small foci of restricted diffusion in both frontal lobes, posterior temporal lobes, occasionally the occipital lobe white matter. White matter mostly affected. Deep gray nuclei, brainstem, and cerebellum are spared. No associated acute hemorrhage or mass effect. Superimposed chronic patchy bilateral cerebral white matter and bilateral  deep gray nuclei T2 and FLAIR heterogeneity is largely stable since 2016. Expected evolution of chronic lacunar infarct of the right thalamus since 2016. Progressed chronic microhemorrhages in the bilateral thalami over the same time. Chronic lacunar infarct in the left pons is stable. Small chronic calcification along the medial right tentorium is stable. No signal abnormality specific for vasogenic edema. No IV contrast administered. No midline shift, mass effect, evidence of mass lesion, ventriculomegaly, extra-axial collection or acute intracranial hemorrhage. Cervicomedullary junction and pituitary are within normal limits. Vascular: Major intracranial vascular flow voids are stable since 2016. Generalized intracranial artery tortuosity. Skull and upper cervical spine: Stable bone marrow signal since 2016. Chronic hyperostosis of the calvarium. Negative for age visible cervical spine. Sinuses/Orbits: Postoperative changes to both globes since 2016. Paranasal Visualized paranasal sinuses and mastoids are stable and well pneumatized. Other: Grossly normal visible internal auditory structures. Negative visible scalp and face soft tissues. IMPRESSION: 1. Numerous scattered small acute infarcts in both cerebral hemispheres, primarily affecting white matter of the MCA territories. No associated hemorrhage or mass effect. Consider recent embolic event. A hypotensive episode might also have this appearance. 2. Underlying advanced chronic small vessel disease, with some progression since 2016. 3. No strong evidence of metastatic disease on this noncontrast exam. Electronically Signed   By: Genevie Ann M.D.   On: 08/14/2020 06:19   US Abdomen Limited RUQ (LIVER/GB)  Result Date: 08/13/2020 CLINICAL DATA:  Elevated liver enzymes. History of pancreatic carcinoma EXAM: ULTRASOUND ABDOMEN LIMITED RIGHT UPPER QUADRANT COMPARISON:  CT abdomen and pelvis April 30, 2020 FINDINGS: Gallbladder: Surgically absent. Common bile  duct: Diameter: 8 mm, within normal limits  for post cholecystectomy state. No appreciable intrahepatic, common hepatic, or common bile duct dilatation. Liver: The liver has an overall increased, inhomogeneous echotexture with nodular contour. Complex partially cystic area arising in the anterior right lobe of the liver measures 5.9 x 4.4 x 6.7 cm. A complex mass in the anterior segment right lobe of the liver measures 3.5 x 2.5 x 4.0 cm. Smaller masslike areas are noted elsewhere throughout the liver, consistent with metastatic foci. Portal vein is patent on color Doppler imaging with normal direction of blood flow towards the liver. Other: None. IMPRESSION: 1. Liver has a nodular contour suggesting underlying cirrhosis. Multiple liver masses likely represent metastatic foci from known pancreatic carcinoma. Degenerating cystic lesion in the anterior segment right lobe of the liver potentially may represent residua from treated metastasis versus infected cystic area. Correlation with CT to assess for possible developing abscess in this area of the liver may be reasonable. 2.  Gallbladder absent. These results will be called to the ordering clinician or representative by the Radiologist Assistant, and communication documented in the PACS or [employer]. Electronically Signed   By: Lowella Grip III M.D.   On: 08/13/2020 10:34    Medications: I have reviewed the patient's current medications.  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 CT abdomen/pelvis 10/23/2016-ampullary mass, single mildly enlarged porta hepatis lymph node, no evidence of distant metastatic disease ? Status post pancreaticoduodenectomy 01/12/2017; pT3pN2 ? CT abdomen/pelvis 03/30/2018-ablation defect within the upper pole of the right kidney. No evidence of recurrent renal mass. New mild left periaortic retroperitoneal  lymphadenopathy. ? CT abdomen/pelvis 06/30/2018-6 mm right middle lobe nodule-slowly growing over multiple CTs, stable ablation defect in the right kidney, 1.9 cm left periaortic node increased from 1.2 cm, stable 1.5 cm porta hepatis node ? CT abdomen/pelvis 09/17/2018-surgical changes related to Whipple procedure. Stable upper abdominal/retroperitoneal lymphadenopathy. 5 mm right middle lobe nodule, stable from most recent CT but mildly progressive from priors. Postprocedural changes related to renal ablation in the medial right upper kidney. ? CTs 03/24/2019-a few scattered pulmonary nodules appears stable; liver has a slightly shrunken appearance and nodular appearance compatible with mild cirrhosis. No suspicious cystic or solid hepatic lesions. Upper abdominal and retroperitoneal lymphadenopathy appears similar compared to the prior study. No other new lymphadenopathy noted elsewhere in the abdomen or pelvis. ? PET scan 07/03/2019-hypermetabolic lymph node adjacent porta hepatis. Newfocal hypermetabolic lesion in the liver. Hypermetabolic activity in the left suprarenal location favored metastatic adenopathy in the retroperitoneum. Diffuse hypermetabolic activity within the stomach favor gastritis. ? Cycle 1 gemcitabine/Abraxane 08/31/2019 ? Cycle 2 gemcitabine/Abraxane 09/14/2019 ? Cycle 3 gemcitabine/Abraxane 09/29/2019 ? Cycle 4 gemcitabine/Abraxane 10/13/2019 ? Cycle 5 gemcitabine/Abraxane 10/27/2019 ? Cycle 6 gemcitabine/Abraxane 11/09/2019 ? CTs 11/13/2019-new 2 mm right apical and 3 mm left lower lobe nodules, stable portacaval node, enlargement of amorphous soft tissue encasing the left renal artery, stable liver lesion ? Cycle 7 gemcitabine/Abraxane 11/24/2019 ? Cycle 8 gemcitabine/Abraxane 12/07/2019 ? 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 Cycle 10 gemcitabine/Abraxane 01/05/2020 ? Cycle 11 gemcitabine/Abraxane 01/18/2020 ? CTs 01/30/2020-small right  pleural effusion. Rounded peripheral mass along the margin of the right lower lobe posteriorly adjacent to the pleural effusion 3.0 x 1.9 cm compared to previous measurement of 3.0 x 1.9 cm. Stable 4 mm right upper lobe nodule. Stable 0.6 x 0.5 cm right middle lobe nodule. Stable 3 mm nodule in the superior segment left  lower lobe. No new nodules identified. Reduced conspicuity of margins of the moderately heterogeneous anterior liver lesion measuring 5.2 x 2.5 x 4.4 cm, previous measurement 3.0 x 2.4 x 3.4 cm. Nodular contour of the liver. Region of hypodensity in segment 5 measuring 2.6 x 1.8 cm, previously 2.0 x 1.3 cm. Portacaval node 1.2 cm, previously 1.3 cm. Porta hepatis node 0.9 cm, formerly 1.3 cm. Abnormal indistinctly marginated soft tissue density probably reflecting nodal tissue surrounding the left proximal renal artery measuring 1.8 cm, formerly 1.9 cm. ? Cycle 12 gemcitabine/Abraxane 02/01/2020 ? Cycle13gemcitabine/Abraxane 02/16/2020 ? Cycle 14 gemcitabine/Abraxane 02/29/2020 ? 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 Cycle 16 gemcitabine 04/05/2020 (Abraxane held due to neuropathy) ? Cycle 17 gemcitabine 04/18/2020 (Abraxane held due to neuropathy) ? CTs 04/30/2020-previously noted pleural-based masslike lesion in the posterior aspect of the right lower lobe with slow increase in size over numerous prior examinations. Slight regression of multiple liver metastases. No new liver lesions noted. Stable left para-aortic lymphadenopathy. ? Continue every 2-week gemcitabine ? Cycle 18 gemcitabine 05/02/2020 (Abraxane held due to neuropathy) ? Cycle 19gemcitabine 05/16/2020 ? Cycle 20 gemcitabine/Abraxane 05/30/2020 ? Cycle 21 gemcitabine/Abraxane 06/14/2020 ? Cycle 22 gemcitabine/Abraxane 06/27/2020 ? Cycle 23 gemcitabine/Abraxane 07/18/2020 ? Cycle 24 gemcitabine/Abraxane 08/02/2020 2. Biliary obstruction secondary to #1, status post placement of a metal,  bile duct stent on 10/23/2016  3. 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 Renal cell carcinoma-status post ablation of a right renal mass 07/15/2016, biopsy confirmed papillary renal cell carcinoma, Fuhrman grade 3  5. CVA in 2016  6. History of thyroid cancer-status post thyroidectomy and radioactive iodine in 2005  7. Diabetes  8.Hypertension  9.Left lower extremity deep vein thrombosis June 2019, IVC filter placed July 2018 after she was diagnosed with a rectus hematoma while on Lovenox  10. Hospitalized 04/12/2019 through 04/14/2019 with traumatic hematoma right lower leg, followed at the wound clinic  11.Port-A-Cath placement interventional radiology 08/29/2019  12.Covid vaccine-second injection administered 11/10/2019; booster 04/05/2020  13.  Resolving right buttock rash 08/02/2020-likely a zoster rash  14.  Admission 08/09/2020 with dyspnea  15.  Acute renal failure  16.  Altered mental status-etiology unclear, improved 08/14/2020  17.  Urinary tract infection 08/13/2020-ceftriaxone  18.  Multiple acute infarcts noted on brain MRI 08/14/2020  Ms. Loria is is much more alert today.  The etiology of her altered mental status over the past few days is unclear.  This may have been related to a urinary tract infection, now being treated with antibiotics.  It is also possible the altered mental status was due to strokes or polypharmacy.  She is alert and oriented today.  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  She and her family understand no therapy will be curative.  It is unclear whether the cancer has progressed.  The CVAs could represent embolic events from marantic endocarditis or another source.  Neurology has been consulted.  I do not recommend aggressive diagnostic intervention such as a TEE.  I discussed the overall prognosis and disposition plan with Ms. Prichett.   Her daughter would like to take her home at discharge from the hospital.  We discussed CODE STATUS.  I recommended a no CODE BLUE status, but Ms. Livingston would like to remain on a full CODE STATUS.  Recommendation:  1.  Neurology consultation to evaluate for multiple brain infarcts, anticoagulation as recommended by neurology 2.  Hold sedating medications 3.  CT abdomen/pelvis for  restaging of ampullary carcinoma if the renal function improves 4.  Antibiotics for the urinary tract infection 5.  Increase out of bed and ambulation as tolerated, continue physical therapy 6.  Discussed disposition plan with Ms. Napoli and family, SNF versus home 7.  Oncology will continue following her in the hospital     LOS: 4 days   Betsy Coder, MD   08/14/2020, 2:36 PM

## 2020-08-14 NOTE — Progress Notes (Signed)
[facility] staff are not able to perform the NIH or modified NIH stroke scale per MD order. Patient is still waiting to be transferred to a neurology unit. MD made aware during initial transfer order. Patient is alert and oriented x 4 currently eating dinner with daughter at the bedside.

## 2020-08-14 NOTE — Progress Notes (Signed)
[facility] PROGRESS NOTE    Progress Note  Kaylee Mullins  OZH:[id number] DOB: 13-Nov-1935 DOA: 08/09/2020 PCP: Fleet Contras, MD     Brief Narrative:   Kaylee Mullins is an 84 y.o. 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  Cardiology thought the patient was not fluid overloaded they agree with holding Lasix no further work-up will need to follow-up with her primary cardiology as an outpatient they have signed off.  Acute CVA: MRI of the brain showed Numerous scattered small acute infarcts in both cerebral hemispheres, primarily affecting white matter of the MCA territories. Concerned about embolic stroke, she is on Coumadin and her INR is therapeutic. Consult neurology.  No A. fib on telemetry. HgbA1c, fasting lipid panel  MRI, MRA of the brain without contrast  PT, OT, Speech consult  Carotid dopplers  Transthoracic Echo,   Start patient on ASA 81mg  daily and plavix 75mg  daily  Atorvastatin 80  BP goal: permissive HTN upto 220/120 mmHg Telemetry monitoring  Chronic diastolic heart failure: Appears to be euvolemic renal function worsened due to IV diuresis.  Lasix was held. Creatinine has stabilized.  Acute kidney injury: Around 1 she was started on IV diuresis creatinine trended up likely prerenal azotemia she was started on IV fluids and her creatinine is stabilized continue IV fluids for an additional 24 hours recheck a basic metabolic panel in the morning.  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  Abdominal ultrasound showed multiple liver  metastases. Fortunately due to her worsening metastases cannot perform CT scan with contrast.  Pancreatic cancer: Initially diagnosed in 2018 underwent Whipple procedure now with progressive disease on chemotherapy.  Diabetes mellitus type 2: Continue long-acting insulin plus sliding scale.  History of DVT: On Coumadin INR therapeutic.  Elevated troponins: Likely due to demand ischemia.  Hyperkalemia: She was given Kayexalate and IV fluids were potassium has improved.  Worsening leukocytosis: She has remained afebrile with a worsening leukocytosis question related to metastatic disease.  DVT prophylaxis: coumadin Family Communication:none Status is: Inpatient  Remains inpatient appropriate because:Hemodynamically unstable   Dispo: The patient is from: Home              Anticipated d/c is to: SNF              Anticipated d/c date is: 2 days              Patient currently is not medically stable to d/c.        Code Status:     Code Status Orders  (From admission, onward)         Start     Ordered   08/09/20 1940  Full code  Continuous        08/09/20 1939        Code Status History    Date Active Date Inactive Code Status Order ID Comments User Context   04/12/2019 1728 04/14/2019 2053 Full Code 04/16/2019  2054, MD ED   07/30/2017 1847 08/06/2017 1909 Full Code 08/01/2017  08/08/2017, PA-C ED   02/06/2017 0324 03/29/2017 2242 Full Code 02/08/2017  03/31/2017,  Randall Hiss, MD Inpatient   01/12/2017 1521 02/05/2017 1855 Full Code [id number]  Stark Klein, MD Inpatient   07/15/2016 1524 07/16/2016 2003 Full Code [id number]  Aletta Edouard, MD Inpatient   02/10/2016 2046 02/15/2016 0002 Full Code [id number]  Kristeen Miss, MD Inpatient   09/16/2014 1707 09/18/2014 1713 Full Code [id number]  Jonetta Osgood, MD Inpatient   09/11/2014 1646 09/13/2014 2219 Full Code [id number]  Samella Parr, NP Inpatient   Advance Care Planning Activity    Advance Directive Documentation    Flowsheet Row Most Recent Value  Type of Advance Directive Healthcare Power of Attorney  Pre-existing out of facility DNR order (yellow form or pink MOST form) --  "MOST" Form in Place? --        IV Access:    Peripheral IV   Procedures and diagnostic studies:   MR BRAIN WO CONTRAST  Result Date: 08/14/2020 CLINICAL DATA:  84 year old female with altered mental status and lethargy. Recent shortness of breath. History of pancreatic cancer. EXAM: MRI HEAD WITHOUT CONTRAST TECHNIQUE: Multiplanar, multiecho pulse sequences of the brain and surrounding structures were obtained without intravenous contrast. COMPARISON:  Brain MRI and intracranial MRA 09/16/2014 FINDINGS: Brain: Widely scattered small foci of restricted diffusion in both frontal lobes, posterior temporal lobes, occasionally the occipital lobe white matter. White matter mostly affected. Deep gray nuclei, brainstem, and cerebellum are spared. No associated acute hemorrhage or mass effect. Superimposed chronic patchy bilateral cerebral white matter and bilateral deep gray nuclei T2 and FLAIR heterogeneity is largely stable since 2016. Expected evolution of chronic lacunar infarct of the right thalamus since 2016. Progressed chronic microhemorrhages in the bilateral thalami over the same time. Chronic lacunar infarct in the left pons is stable. Small chronic calcification along the medial right tentorium is stable. No signal abnormality specific for vasogenic edema. No IV contrast administered. No midline shift, mass effect, evidence of mass lesion, ventriculomegaly, extra-axial collection or acute intracranial hemorrhage. Cervicomedullary junction and pituitary are within normal limits. Vascular: Major intracranial vascular flow voids are stable since 2016. Generalized intracranial artery tortuosity. Skull and upper cervical spine: Stable bone marrow signal since 2016. Chronic hyperostosis of the calvarium. Negative for age visible  cervical spine. Sinuses/Orbits: Postoperative changes to both globes since 2016. Paranasal Visualized paranasal sinuses and mastoids are stable and well pneumatized. Other: Grossly normal visible internal auditory structures. Negative visible scalp and face soft tissues. IMPRESSION: 1. Numerous scattered small acute infarcts in both cerebral hemispheres, primarily affecting white matter of the MCA territories. No associated hemorrhage or mass effect. Consider recent embolic event. A hypotensive episode might also have this appearance. 2. Underlying advanced chronic small vessel disease, with some progression since 2016. 3. No strong evidence of metastatic disease on this noncontrast exam. Electronically Signed   By: Genevie Ann M.D.   On: 08/14/2020 06:19   US Abdomen Limited RUQ (LIVER/GB)  Result Date: 08/13/2020 CLINICAL DATA:  Elevated liver enzymes. History of pancreatic carcinoma EXAM: ULTRASOUND ABDOMEN LIMITED RIGHT UPPER QUADRANT COMPARISON:  CT abdomen and pelvis April 30, 2020 FINDINGS: Gallbladder: Surgically absent. Common bile duct: Diameter: 8 mm, within normal limits for post cholecystectomy state. No appreciable intrahepatic, common hepatic, or common bile duct dilatation. Liver: The liver has an overall increased, inhomogeneous echotexture with nodular contour. Complex partially cystic area arising in the anterior right lobe of the liver measures 5.9 x 4.4 x 6.7 cm. A complex mass in the anterior segment right lobe of the liver measures 3.5 x 2.5 x 4.0 cm.  Smaller masslike areas are noted elsewhere throughout the liver, consistent with metastatic foci. Portal vein is patent on color Doppler imaging with normal direction of blood flow towards the liver. Other: None. IMPRESSION: 1. Liver has a nodular contour suggesting underlying cirrhosis. Multiple liver masses likely represent metastatic foci from known pancreatic carcinoma. Degenerating cystic lesion in the anterior segment right lobe of the  liver potentially may represent residua from treated metastasis versus infected cystic area. Correlation with CT to assess for possible developing abscess in this area of the liver may be reasonable. 2.  Gallbladder absent. These results will be called to the ordering clinician or representative by the Radiologist Assistant, and communication documented in the PACS or [employer]. Electronically Signed   By: Lowella Grip III M.D.   On: 08/13/2020 10:34     Medical Consultants:    None.  Anti-Infectives:   none  Subjective:    [name] slow to respond still confused.  Objective:    Vitals:   08/14/20 0037 08/14/20 0442 08/14/20 0956 08/14/20 1136  BP:  (!) 104/53 129/62 (!) 117/56  Pulse:  68 68 73  Resp:  20  20  Temp:  98.1 F (36.7 C)  98.6 F (37 C)  TempSrc:  Oral  Oral  SpO2:  96%  99%  Weight: 82.6 kg     Height:       SpO2: 99 % O2 Flow Rate (L/min): 3 L/min FiO2 (%): 100 %   Intake/Output Summary (Last 24 hours) at 08/14/2020 1211 Last data filed at 08/14/2020 0950 Gross per 24 hour  Intake 2091.45 ml  Output 50 ml  Net 2041.45 ml   Filed Weights   08/12/20 0513 08/13/20 0355 08/14/20 0037  Weight: 81.3 kg 82.5 kg 82.6 kg    Exam: General exam: In no acute distress. Respiratory system: Good air movement and clear to auscultation. Cardiovascular system: S1 & S2 heard, RRR. No JVD. Gastrointestinal system: Abdomen is nondistended, soft and nontender.  Extremities: No pedal edema. Skin: No rashes, lesions or ulcers Data Reviewed:    Labs: Basic Metabolic Panel: Recent Labs  Lab 08/11/20 0421 08/12/20 0316 08/13/20 0330 08/13/20 1611 08/14/20 0357  NA 140 138 138 138 144  K 3.2* 3.3* 5.5* 4.3 4.3  CL 99 100 102 102 107  CO2 30 28 24 26 26   GLUCOSE 175* 116* 157* 132* 96  BUN 32* 38* 44* 46* 48*  CREATININE 1.82* 2.21* 2.24* 2.22* 2.24*  CALCIUM 7.8* 7.6* 7.5* 7.3* 7.5*  MG  --  2.1  --   --   --    GFR Estimated  Creatinine Clearance: 20.1 mL/min (A) (by C-G formula based on SCr of 2.24 mg/dL (H)). Liver Function Tests: Recent Labs  Lab 08/09/20 0900 08/10/20 0227 08/11/20 0421 08/12/20 0316 08/13/20 0330  AST 61* 67* 199* 159* 102*  ALT 25 30 57* 60* 50*  ALKPHOS 125 145* 176* 166* 185*  BILITOT 0.8 0.5 0.8 0.5 1.5*  PROT 6.1* 6.2* 6.1* 5.8* 6.0*  ALBUMIN 1.9* 1.8* 1.6* 1.6* 1.7*   No results for input(s): LIPASE, AMYLASE in the last 168 hours. Recent Labs  Lab 08/13/20 2014  AMMONIA 27   Coagulation profile Recent Labs  Lab 08/10/20 0227 08/11/20 0421 08/12/20 0316 08/13/20 0330 08/14/20 0357  INR 4.6* 3.9* 3.5* 2.8* 2.8*   COVID-19 Labs  No results for input(s): DDIMER, FERRITIN, LDH, CRP in the last 72 hours.  Lab Results  Component Value Date  [place] NEGATIVE 08/09/2020    CBC: Recent Labs  Lab 08/09/20 0700 08/10/20 0227 08/11/20 0421 08/12/20 0316 08/14/20 0357  WBC 9.9 11.7* 13.0* 15.3* 22.8*  NEUTROABS 8.3*  --   --   --  18.0*  HGB 9.3* 8.9* 9.4* 8.9* 9.6*  HCT 28.5* 28.0* 29.1* 28.2* 29.2*  MCV 95.3 93.3 93.0 93.4 93.9  PLT 153 191 221 220 339   Cardiac Enzymes: No results for input(s): CKTOTAL, CKMB, CKMBINDEX, TROPONINI in the last 168 hours. BNP (last 3 results) No results for input(s): PROBNP in the last 8760 hours. CBG: Recent Labs  Lab 08/13/20 1616 08/13/20 2000 08/13/20 2226 08/14/20 0615 08/14/20 1109  GLUCAP 128* 103* 95 88 195*   D-Dimer: No results for input(s): DDIMER in the last 72 hours. Hgb A1c: No results for input(s): HGBA1C in the last 72 hours. Lipid Profile: No results for input(s): CHOL, HDL, LDLCALC, TRIG, CHOLHDL, LDLDIRECT in the last 72 hours. Thyroid function studies: No results for input(s): TSH, T4TOTAL, T3FREE, THYROIDAB in the last 72 hours.  Invalid input(s): FREET3 Anemia work up: No results for input(s): VITAMINB12, FOLATE, FERRITIN, TIBC, IRON, RETICCTPCT in the last 72 hours. Sepsis  Labs: Recent Labs  Lab 08/10/20 0227 08/11/20 0421 08/12/20 0316 08/14/20 0357  WBC 11.7* 13.0* 15.3* 22.8*   Microbiology Recent Results (from the past 240 hour(s))  Resp Panel by RT-PCR (Flu A&B, Covid) Nasopharyngeal Swab     Status: None   Collection Time: 08/09/20  9:12 AM   Specimen: Nasopharyngeal Swab; Nasopharyngeal(NP) swabs in vial transport medium  Result Value Ref Range Status   SARS Coronavirus 2 by RT PCR NEGATIVE NEGATIVE Final    Comment: (NOTE) SARS-CoV-2 target nucleic acids are NOT DETECTED.  The SARS-CoV-2 RNA is generally detectable in upper respiratory specimens during the acute phase of infection. The lowest concentration of SARS-CoV-2 viral copies this assay can detect is 138 copies/mL. A negative result does not preclude SARS-Cov-2 infection and should not be used as the sole basis for treatment or other patient management decisions. A negative result may occur with  improper specimen collection/handling, submission of specimen other than nasopharyngeal swab, presence of viral mutation(s) within the areas targeted by this assay, and inadequate number of viral copies(<138 copies/mL). A negative result must be combined with clinical observations, patient history, and epidemiological information. The expected result is Negative.  Fact Sheet for Patients:  EntrepreneurPulse.com.au  Fact Sheet for Healthcare Providers:  IncredibleEmployment.be  This test is no t yet approved or cleared by the [place] FDA and  has been authorized for detection and/or diagnosis of SARS-CoV-2 by FDA under an Emergency Use Authorization (EUA). This EUA will remain  in effect (meaning this test can be used) for the duration of the COVID-19 declaration under Section 564(b)(1) of the Act, 21 U.S.C.section 360bbb-3(b)(1), unless the authorization is terminated  or revoked sooner.       Influenza A by PCR NEGATIVE NEGATIVE Final    Influenza B by PCR NEGATIVE NEGATIVE Final    Comment: (NOTE) The Xpert Xpress SARS-CoV-2/FLU/RSV plus assay is intended as an aid in the diagnosis of influenza from Nasopharyngeal swab specimens and should not be used as a sole basis for treatment. Nasal washings and aspirates are unacceptable for Xpert Xpress SARS-CoV-2/FLU/RSV testing.  Fact Sheet for Patients: EntrepreneurPulse.com.au  Fact Sheet for Healthcare Providers: IncredibleEmployment.be  This test is not yet approved or cleared by the [place] FDA and has been authorized for detection and/or diagnosis of SARS-CoV-2  by FDA under an Emergency Use Authorization (EUA). This EUA will remain in effect (meaning this test can be used) for the duration of the COVID-19 declaration under Section 564(b)(1) of the Act, 21 U.S.C. section 360bbb-3(b)(1), unless the authorization is terminated or revoked.  Performed at [facility], [place] [address] [place], [place] [address]      Medications:   . carvedilol  25 mg Oral BID  . Chlorhexidine Gluconate Cloth  6 each Topical Daily  . docusate sodium  100 mg Oral Daily  . famotidine  20 mg Oral Daily  . feeding supplement (GLUCERNA SHAKE)  237 mL Oral TID BM  . insulin aspart  0-9 Units Subcutaneous TID WC  . insulin detemir  7 Units Subcutaneous BID  . levothyroxine  200 mcg Oral QAC breakfast  . melatonin  5 mg Oral QHS  . multivitamin with minerals  1 tablet Oral Daily  . pantoprazole  40 mg Oral QHS  . polyethylene glycol  17 g Oral BID  . sodium chloride flush  3 mL Intravenous Q12H  . warfarin  5 mg Oral ONCE-1600  . Warfarin - Pharmacist Dosing Inpatient   Does not apply q1600   Continuous Infusions: . sodium chloride    . sodium chloride 75 mL/hr at 08/14/20 0635  . cefTRIAXone (ROCEPHIN)  IV 1 g (08/14/20 0200)      LOS: 4 days   Charlynne Cousins  [facility]  08/14/2020, 12:11 PM

## 2020-08-15 ENCOUNTER — Inpatient Hospital Stay: Payer: Medicare Other

## 2020-08-15 ENCOUNTER — Inpatient Hospital Stay (HOSPITAL_COMMUNITY): Payer: Medicare Other

## 2020-08-15 ENCOUNTER — Inpatient Hospital Stay: Payer: Medicare Other | Admitting: Nurse Practitioner

## 2020-08-15 DIAGNOSIS — Z8673 Personal history of transient ischemic attack (TIA), and cerebral infarction without residual deficits: Secondary | ICD-10-CM

## 2020-08-15 DIAGNOSIS — C241 Malignant neoplasm of ampulla of Vater: Secondary | ICD-10-CM | POA: Diagnosis not present

## 2020-08-15 DIAGNOSIS — C787 Secondary malignant neoplasm of liver and intrahepatic bile duct: Secondary | ICD-10-CM | POA: Diagnosis not present

## 2020-08-15 DIAGNOSIS — R7989 Other specified abnormal findings of blood chemistry: Secondary | ICD-10-CM | POA: Diagnosis not present

## 2020-08-15 DIAGNOSIS — N390 Urinary tract infection, site not specified: Secondary | ICD-10-CM | POA: Diagnosis not present

## 2020-08-15 DIAGNOSIS — I639 Cerebral infarction, unspecified: Secondary | ICD-10-CM | POA: Diagnosis not present

## 2020-08-15 DIAGNOSIS — R4182 Altered mental status, unspecified: Secondary | ICD-10-CM | POA: Diagnosis not present

## 2020-08-15 DIAGNOSIS — I5033 Acute on chronic diastolic (congestive) heart failure: Secondary | ICD-10-CM | POA: Diagnosis not present

## 2020-08-15 LAB — GLUCOSE, CAPILLARY
Glucose-Capillary: 184 mg/dL — ABNORMAL HIGH (ref 70–99)
Glucose-Capillary: 167 mg/dL — ABNORMAL HIGH (ref 70–99)
Glucose-Capillary: 176 mg/dL — ABNORMAL HIGH (ref 70–99)
Glucose-Capillary: 201 mg/dL — ABNORMAL HIGH (ref 70–99)

## 2020-08-15 LAB — BASIC METABOLIC PANEL
Anion gap: 10 (ref 5–15)
BUN: 49 mg/dL — ABNORMAL HIGH (ref 8–23)
CO2: 25 mmol/L (ref 22–32)
Calcium: 7.2 mg/dL — ABNORMAL LOW (ref 8.9–10.3)
Chloride: 104 mmol/L (ref 98–111)
Creatinine, Ser: 1.99 mg/dL — ABNORMAL HIGH (ref 0.44–1.00)
GFR, Estimated: 25 mL/min — ABNORMAL LOW (ref >60)
Glucose, Bld: 202 mg/dL — ABNORMAL HIGH (ref 70–99)
Potassium: 4.1 mmol/L (ref 3.5–5.1)
Sodium: 139 mmol/L (ref 135–145)

## 2020-08-15 LAB — LIPID PANEL
Cholesterol: 50 mg/dL (ref 0–200)
HDL: 13 mg/dL — ABNORMAL LOW (ref >40)
LDL Cholesterol: 21 mg/dL (ref 0–99)
Total CHOL/HDL Ratio: 3.8 RATIO
Triglycerides: 80 mg/dL (ref <150)
VLDL: 16 mg/dL (ref 0–40)

## 2020-08-15 LAB — HEMOGLOBIN A1C
Hgb A1c MFr Bld: 8.5 % — ABNORMAL HIGH (ref 4.8–5.6)
Mean Plasma Glucose: 197.25 mg/dL

## 2020-08-15 LAB — PROTIME-INR
INR: 2.7 — ABNORMAL HIGH (ref 0.8–1.2)
Prothrombin Time: 27.6 seconds — ABNORMAL HIGH (ref 11.4–15.2)

## 2020-08-15 MED ORDER — INSULIN ASPART 100 UNIT/ML ~~LOC~~ SOLN
3.0000 [IU] | Freq: Three times a day (TID) | SUBCUTANEOUS | Status: DC
  Administered 2020-08-16 – 2020-08-20 (×10): 3 [IU] via SUBCUTANEOUS

## 2020-08-15 MED ORDER — CARVEDILOL 12.5 MG PO TABS
12.5000 mg | ORAL_TABLET | Freq: Two times a day (BID) | ORAL | Status: DC
  Administered 2020-08-15 – 2020-08-20 (×8): 12.5 mg via ORAL
  Filled 2020-08-15 (×9): qty 1

## 2020-08-15 MED ORDER — WARFARIN SODIUM 3 MG PO TABS
3.0000 mg | ORAL_TABLET | Freq: Once | ORAL | Status: AC
  Administered 2020-08-15: 16:00:00 3 mg via ORAL
  Filled 2020-08-15: qty 1

## 2020-08-15 MED ORDER — INSULIN ASPART 100 UNIT/ML ~~LOC~~ SOLN
0.0000 [IU] | Freq: Every day | SUBCUTANEOUS | Status: DC
  Administered 2020-08-15: 22:00:00 2 [IU] via SUBCUTANEOUS

## 2020-08-15 MED ORDER — OXYCODONE HCL 5 MG PO TABS
5.0000 mg | ORAL_TABLET | Freq: Three times a day (TID) | ORAL | Status: DC | PRN
  Administered 2020-08-15 – 2020-08-20 (×7): 5 mg via ORAL
  Filled 2020-08-15 (×7): qty 1

## 2020-08-15 MED ORDER — INSULIN ASPART 100 UNIT/ML ~~LOC~~ SOLN
0.0000 [IU] | Freq: Three times a day (TID) | SUBCUTANEOUS | Status: DC
  Administered 2020-08-15 – 2020-08-17 (×4): 2 [IU] via SUBCUTANEOUS
  Administered 2020-08-17: 3 [IU] via SUBCUTANEOUS
  Administered 2020-08-18: 2 [IU] via SUBCUTANEOUS
  Administered 2020-08-18: 1 [IU] via SUBCUTANEOUS
  Administered 2020-08-18: 3 [IU] via SUBCUTANEOUS
  Administered 2020-08-19: 1 [IU] via SUBCUTANEOUS
  Administered 2020-08-19 – 2020-08-20 (×2): 2 [IU] via SUBCUTANEOUS

## 2020-08-15 NOTE — Care Management Important Message (Signed)
Important Message  Patient Details  Name: Kaylee Mullins MRN: [id number] Date of Birth: [birth year]   Medicare Important Message Given:  Yes     Renie Ora 08/15/2020, 9:54 AM

## 2020-08-15 NOTE — Plan of Care (Signed)

## 2020-08-15 NOTE — Progress Notes (Signed)
IP PROGRESS NOTE  Subjective:   Ms. Dedmon complains of abdominal pain.  She wants to go home at discharge. Objective: Vital signs in last 24 hours: Blood pressure (!) 117/49, pulse (!) 59, temperature 98.3 F (36.8 C), temperature source Oral, resp. rate 18, height 5\' 4"  (1.626 m), weight 183 lb 6.8 oz (83.2 kg), SpO2 100 %.  Intake/Output from previous day: 12/29 0701 - 12/30 0700 In: 2998.1 [P.O.:1020; I.V.:1978.1] Out: 850 [Urine:850]  Physical Exam:    Abdomen: Soft, no hepatosplenomegaly, nontender, no mass Extremities: No leg edema Neurologic: Alert, follows commands, moves all extremities to command, speech is fluent, 4/5 strength in the right arm  Portacath/PICC-without erythema  Lab Results: Recent Labs    08/14/20 0357  WBC 22.8*  HGB 9.6*  HCT 29.2*  PLT 339    BMET Recent Labs    08/14/20 0357 08/15/20 0249  NA 144 139  K 4.3 4.1  CL 107 104  CO2 26 25  GLUCOSE 96 202*  BUN 48* 49*  CREATININE 2.24* 1.99*  CALCIUM 7.5* 7.2*    No results found for: CEA1  Studies/Results: MR BRAIN WO CONTRAST  Result Date: 08/14/2020 CLINICAL DATA:  84 year old female with altered mental status and lethargy. Recent shortness of breath. History of pancreatic cancer. EXAM: MRI HEAD WITHOUT CONTRAST TECHNIQUE: Multiplanar, multiecho pulse sequences of the brain and surrounding structures were obtained without intravenous contrast. COMPARISON:  Brain MRI and intracranial MRA 09/16/2014 FINDINGS: Brain: Widely scattered small foci of restricted diffusion in both frontal lobes, posterior temporal lobes, occasionally the occipital lobe white matter. White matter mostly affected. Deep gray nuclei, brainstem, and cerebellum are spared. No associated acute hemorrhage or mass effect. Superimposed chronic patchy bilateral cerebral white matter and bilateral deep gray nuclei T2 and FLAIR heterogeneity is largely stable since 2016. Expected evolution of chronic lacunar infarct  of the right thalamus since 2016. Progressed chronic microhemorrhages in the bilateral thalami over the same time. Chronic lacunar infarct in the left pons is stable. Small chronic calcification along the medial right tentorium is stable. No signal abnormality specific for vasogenic edema. No IV contrast administered. No midline shift, mass effect, evidence of mass lesion, ventriculomegaly, extra-axial collection or acute intracranial hemorrhage. Cervicomedullary junction and pituitary are within normal limits. Vascular: Major intracranial vascular flow voids are stable since 2016. Generalized intracranial artery tortuosity. Skull and upper cervical spine: Stable bone marrow signal since 2016. Chronic hyperostosis of the calvarium. Negative for age visible cervical spine. Sinuses/Orbits: Postoperative changes to both globes since 2016. Paranasal Visualized paranasal sinuses and mastoids are stable and well pneumatized. Other: Grossly normal visible internal auditory structures. Negative visible scalp and face soft tissues. IMPRESSION: 1. Numerous scattered small acute infarcts in both cerebral hemispheres, primarily affecting white matter of the MCA territories. No associated hemorrhage or mass effect. Consider recent embolic event. A hypotensive episode might also have this appearance. 2. Underlying advanced chronic small vessel disease, with some progression since 2016. 3. No strong evidence of metastatic disease on this noncontrast exam. Electronically Signed   By: Genevie Ann M.D.   On: 08/14/2020 06:19   VAS US CAROTID  Result Date: 08/15/2020 Carotid Arterial Duplex Study Indications:       CVA. Risk Factors:      Hypertension, hyperlipidemia, Diabetes, no history of                    smoking. Comparison Study:  Prev 09/2016 Performing Technologist: Vonzell Schlatter RVT  Examination Guidelines: A  complete evaluation includes B-mode imaging, spectral Doppler, color Doppler, and power Doppler as needed of all  accessible portions of each vessel. Bilateral testing is considered an integral part of a complete examination. Limited examinations for reoccurring indications may be performed as noted.  Right Carotid Findings: +----------+--------+--------+--------+------------------+--------+           PSV cm/sEDV cm/sStenosisPlaque DescriptionComments +----------+--------+--------+--------+------------------+--------+ CCA Prox  58      11                                         +----------+--------+--------+--------+------------------+--------+ CCA Distal56      12                                         +----------+--------+--------+--------+------------------+--------+ ICA Prox  59      14      1-39%   heterogenous               +----------+--------+--------+--------+------------------+--------+ ICA Distal68      15                                         +----------+--------+--------+--------+------------------+--------+ ECA       63                                                 +----------+--------+--------+--------+------------------+--------+ +----------+--------+-------+--------+-------------------+           PSV cm/sEDV cmsDescribeArm Pressure (mmHG) +----------+--------+-------+--------+-------------------+ [id number]                                        +----------+--------+-------+--------+-------------------+ +---------+--------+--+--------+--+ VertebralPSV cm/s69EDV cm/s14 +---------+--------+--+--------+--+  Left Carotid Findings: +----------+--------+--------+--------+------------------+--------+           PSV cm/sEDV cm/sStenosisPlaque DescriptionComments +----------+--------+--------+--------+------------------+--------+ CCA Prox  82      20                                         +----------+--------+--------+--------+------------------+--------+ CCA Distal67      13                                          +----------+--------+--------+--------+------------------+--------+ ICA Prox  65      18      1-39%   heterogenous               +----------+--------+--------+--------+------------------+--------+ ICA Distal48      10                                tortuous +----------+--------+--------+--------+------------------+--------+ ECA       54                                                 +----------+--------+--------+--------+------------------+--------+ +----------+--------+--------+--------+-------------------+  PSV cm/sEDV cm/sDescribeArm Pressure (mmHG) +----------+--------+--------+--------+-------------------+ [id number]                                         +----------+--------+--------+--------+-------------------+ +---------+--------+--+--------+-+ VertebralPSV cm/s44EDV cm/s8 +---------+--------+--+--------+-+   Summary: Right Carotid: Velocities in the right ICA are consistent with a 1-39% stenosis. Left Carotid: Velocities in the left ICA are consistent with a 1-39% stenosis. Vertebrals:  Bilateral vertebral arteries demonstrate antegrade flow. Subclavians: Normal flow hemodynamics were seen in bilateral subclavian              arteries. *See table(s) above for measurements and observations.     Preliminary     Medications: I have reviewed the patient's current medications.  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 CT abdomen/pelvis 10/23/2016-ampullary mass, single mildly enlarged porta hepatis lymph node, no evidence of distant metastatic disease ? Status post pancreaticoduodenectomy 01/12/2017; pT3pN2 ? CT abdomen/pelvis 03/30/2018-ablation defect within the upper pole of the right kidney. No evidence of recurrent renal mass. New mild left periaortic retroperitoneal lymphadenopathy. ? CT abdomen/pelvis 06/30/2018-6 mm right middle lobe  nodule-slowly growing over multiple CTs, stable ablation defect in the right kidney, 1.9 cm left periaortic node increased from 1.2 cm, stable 1.5 cm porta hepatis node ? CT abdomen/pelvis 09/17/2018-surgical changes related to Whipple procedure. Stable upper abdominal/retroperitoneal lymphadenopathy. 5 mm right middle lobe nodule, stable from most recent CT but mildly progressive from priors. Postprocedural changes related to renal ablation in the medial right upper kidney. ? CTs 03/24/2019-a few scattered pulmonary nodules appears stable; liver has a slightly shrunken appearance and nodular appearance compatible with mild cirrhosis. No suspicious cystic or solid hepatic lesions. Upper abdominal and retroperitoneal lymphadenopathy appears similar compared to the prior study. No other new lymphadenopathy noted elsewhere in the abdomen or pelvis. ? PET scan 07/03/2019-hypermetabolic lymph node adjacent porta hepatis. Newfocal hypermetabolic lesion in the liver. Hypermetabolic activity in the left suprarenal location favored metastatic adenopathy in the retroperitoneum. Diffuse hypermetabolic activity within the stomach favor gastritis. ? Cycle 1 gemcitabine/Abraxane 08/31/2019 ? Cycle 2 gemcitabine/Abraxane 09/14/2019 ? Cycle 3 gemcitabine/Abraxane 09/29/2019 ? Cycle 4 gemcitabine/Abraxane 10/13/2019 ? Cycle 5 gemcitabine/Abraxane 10/27/2019 ? Cycle 6 gemcitabine/Abraxane 11/09/2019 ? CTs 11/13/2019-new 2 mm right apical and 3 mm left lower lobe nodules, stable portacaval node, enlargement of amorphous soft tissue encasing the left renal artery, stable liver lesion ? Cycle 7 gemcitabine/Abraxane 11/24/2019 ? Cycle 8 gemcitabine/Abraxane 12/07/2019 ? 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 Cycle 10 gemcitabine/Abraxane 01/05/2020 ? Cycle 11 gemcitabine/Abraxane 01/18/2020 ? CTs 01/30/2020-small right pleural effusion. Rounded peripheral mass along the margin of the right lower  lobe posteriorly adjacent to the pleural effusion 3.0 x 1.9 cm compared to previous measurement of 3.0 x 1.9 cm. Stable 4 mm right upper lobe nodule. Stable 0.6 x 0.5 cm right middle lobe nodule. Stable 3 mm nodule in the superior segment left lower lobe. No new nodules identified. Reduced conspicuity of margins of the moderately heterogeneous anterior liver lesion measuring 5.2 x 2.5 x 4.4 cm, previous measurement 3.0 x 2.4 x 3.4 cm. Nodular contour of the liver. Region of hypodensity in segment 5 measuring 2.6 x 1.8 cm, previously 2.0 x 1.3 cm. Portacaval node 1.2 cm, previously 1.3 cm. Porta hepatis node 0.9 cm, formerly 1.3 cm. Abnormal indistinctly marginated soft tissue density probably reflecting  nodal tissue surrounding the left proximal renal artery measuring 1.8 cm, formerly 1.9 cm. ? Cycle 12 gemcitabine/Abraxane 02/01/2020 ? Cycle13gemcitabine/Abraxane 02/16/2020 ? Cycle 14 gemcitabine/Abraxane 02/29/2020 ? 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 Cycle 16 gemcitabine 04/05/2020 (Abraxane held due to neuropathy) ? Cycle 17 gemcitabine 04/18/2020 (Abraxane held due to neuropathy) ? CTs 04/30/2020-previously noted pleural-based masslike lesion in the posterior aspect of the right lower lobe with slow increase in size over numerous prior examinations. Slight regression of multiple liver metastases. No new liver lesions noted. Stable left para-aortic lymphadenopathy. ? Continue every 2-week gemcitabine ? Cycle 18 gemcitabine 05/02/2020 (Abraxane held due to neuropathy) ? Cycle 19gemcitabine 05/16/2020 ? Cycle 20 gemcitabine/Abraxane 05/30/2020 ? Cycle 21 gemcitabine/Abraxane 06/14/2020 ? Cycle 22 gemcitabine/Abraxane 06/27/2020 ? Cycle 23 gemcitabine/Abraxane 07/18/2020 ? Cycle 24 gemcitabine/Abraxane 08/02/2020 2. Biliary obstruction secondary to #1, status post placement of a metal, bile duct stent on 10/23/2016  3. 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 Renal cell carcinoma-status post ablation of a right renal mass 07/15/2016, biopsy confirmed papillary renal cell carcinoma, Fuhrman grade 3  5. CVA in 2016  6. History of thyroid cancer-status post thyroidectomy and radioactive iodine in 2005  7. Diabetes  8.Hypertension  9.Left lower extremity deep vein thrombosis June 2019, IVC filter placed July 2018 after she was diagnosed with a rectus hematoma while on Lovenox  10. Hospitalized 04/12/2019 through 04/14/2019 with traumatic hematoma right lower leg, followed at the wound clinic  11.Port-A-Cath placement interventional radiology 08/29/2019  12.Covid vaccine-second injection administered 11/10/2019; booster 04/05/2020  13.  Resolving right buttock rash 08/02/2020-likely a zoster rash  14.  Admission 08/09/2020 with dyspnea  15.  Acute renal failure  16.  Altered mental status-etiology unclear, improved 08/14/2020  17.  Urinary tract infection 08/13/2020-ceftriaxone  18.  Multiple acute infarcts noted on brain MRI 08/14/2020, likely watershed infarcts from hypotension  Ms. Bruneau appears more alert again today.  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  I will add back low-dose oxycodone.  Her performance status remains poor.  I doubt she will be able to go home with her family and her current condition.  She will likely need skilled nursing facility placement.  I discussed CODE STATUS with her again today.  She would like to remain on a full CODE STATUS. Recommendation:  1.  Oxycodone 5 mg every 8 hours as needed for pain 2.  Physical therapy 3.  Antibiotics for the E. coli urinary tract infection 4.  Please call oncology as needed, I will check on her 08/20/2019 if she remains in the hospital, outpatient follow-up will be scheduled at the Cancer center     LOS: 5 days   Betsy Coder, MD   08/15/2020, 1:46  PM

## 2020-08-15 NOTE — Evaluation (Signed)
Speech Language Pathology Evaluation Patient Details Name: Kaylee Mullins MRN: [id number] DOB: [birth year] Today's Date: 08/15/2020 Time: 1430-1446 SLP Time Calculation (min) (ACUTE ONLY): 16 min  Problem List:  Patient Active Problem List   Diagnosis Date Noted  . Acute on chronic diastolic CHF (congestive heart failure) ([place]) 08/10/2020  . SOB (shortness of breath) 08/09/2020  . Elevated troponin 08/09/2020  . Shortness of breath 08/09/2020  . Port-A-Cath in place 09/14/2019  . Goals of care, counseling/discussion 08/23/2019  . Traumatic hematoma of right lower leg 04/13/2019  . Cellulitis 04/12/2019  . Hypokalemia 08/25/2017  . Spinal stenosis   . Pneumonia   . Full dentures   . Depression   . Complication of anesthesia   . Chronic kidney disease   . Cancer ([place])   . Black tarry stools   . Arthritis   . Anxiety   . Hypertension 08/03/2017  . Paroxysmal SVT (supraventricular tachycardia) ([place]) 08/03/2017  . History of CVA (cerebrovascular accident) 08/03/2017  . Hypothyroidism, adult 08/03/2017  . Chronic diastolic heart failure ([place]) 08/03/2017  . History of cancer of ampulla of duodenum 08/03/2017  . Left flank pain 08/03/2017  . Diabetes mellitus type 2, uncontrolled ([place])   . CKD stage 3 due to type 2 diabetes mellitus ([place])   . SVT (supraventricular tachycardia) ([place]) 07/30/2017  . Edema extremities 07/30/2017  . DVT, lower extremity, recurrent, bilateral ([place]) 07/30/2017  . On total parenteral nutrition (TPN) 04/08/2017  . Hematoma of rectus sheath 04/03/2017  . GERD (gastroesophageal reflux disease) 04/03/2017  . Malnutrition of moderate degree 02/07/2017  . Gastroparesis 02/06/2017  . Adenocarcinoma ([place]) 01/12/2017  . Ampullary carcinoma ([place]) 10/27/2016  . Essential hypertension 02/11/2016  . Lumbar stenosis with neurogenic claudication 02/10/2016  . Hyperlipidemia 02/10/2016  . Hypothyroidism 02/10/2016  . Musculoskeletal neck pain 12/13/2014  .  Muscular pain 12/13/2014  . Stroke ([place]) 09/17/2014  . Numbness   . Acute CVA (cerebrovascular accident) ([place]) 09/16/2014  . Right renal mass 09/13/2014  . Cerebral infarction due to unspecified mechanism   . CVA (cerebral infarction) 09/11/2014  . Osteoarthritis 09/11/2014  . Postsurgical hypothyroidism 09/11/2014  . History of CVA (cerebral vascular accident) ([place]) 09/11/2014  . Diabetes mellitus without complication ([place]) [id number]  . Dyslipidemia 04/20/2007   Past Medical History:  Past Medical History:  Diagnosis Date  . Acute CVA (cerebrovascular accident) ([place]) 09/16/2014  . Acute DVT of left tibial vein ([place]) 04/03/2017  . Acute on chronic diastolic CHF (congestive heart failure) ([place]) 08/10/2020  . Adenocarcinoma ([place]) 01/12/2017  . Ampullary carcinoma ([place]) 10/27/2016  . Anxiety   . Arthritis    knees  . Black tarry stools    05-14-16 negative for occult blood with ER visit- noted in [place].  . Cancer [facility])    thyroid cancer- surgery and radiation  . Cerebral infarction due to unspecified mechanism   . Chronic kidney disease    questionable mass on kidney. Being followed by Dr Diona Fanti  . Complication of anesthesia    heart rate was really low  . CVA (cerebral infarction) 09/11/2014  . Depression   . Diabetes mellitus    type 2  . Diabetes mellitus without complication ([place]) [id number]   Qualifier: Diagnosis of  By: Loanne Drilling MD, Jacelyn Pi   . Dyslipidemia 04/20/2007   Qualifier: Diagnosis of  By: Loanne Drilling MD, Jacelyn Pi   . Full dentures   . Gastroparesis 02/06/2017  . GERD (gastroesophageal reflux disease) 04/03/2017  . History of CVA (cerebral vascular accident) (  [facility]) 09/11/2014  . Hypertension   . Hypokalemia 08/25/2017  . Hypothyroidism   . Lumbar stenosis with neurogenic claudication 02/10/2016  . Osteoarthritis 09/11/2014  . Pneumonia   . Right renal mass 09/13/2014  . Spinal stenosis   . Stroke ([facility]) 09/2014   left sided weakness  . SVT (supraventricular tachycardia) ([facility])  07/30/2017   Past Surgical History:  Past Surgical History:  Procedure Laterality Date  . ABDOMINAL HYSTERECTOMY     partial  . cataracts     Removed  11/2015  bilateral  . COLONOSCOPY W/ POLYPECTOMY    . ERCP N/A 05/07/2016   Procedure: ENDOSCOPIC RETROGRADE CHOLANGIOPANCREATOGRAPHY (ERCP);  Surgeon: Vida Rigger, MD;  Location: [facility] ENDOSCOPY;  Service: Endoscopy;  Laterality: N/A;  . ERCP N/A 10/16/2016   Procedure: ENDOSCOPIC RETROGRADE CHOLANGIOPANCREATOGRAPHY (ERCP);  Surgeon: Vida Rigger, MD;  Location: [facility] ENDOSCOPY;  Service: Endoscopy;  Laterality: N/A;  . ESOPHAGOGASTRODUODENOSCOPY N/A 02/10/2017   Procedure: ESOPHAGOGASTRODUODENOSCOPY (EGD);  Surgeon: Dorena Cookey, MD;  Location: [facility] ENDOSCOPY;  Service: Endoscopy;  Laterality: N/A;  . EUS N/A 05/27/2016   Procedure: ESOPHAGEAL ENDOSCOPIC ULTRASOUND (EUS) RADIAL;  Surgeon: Willis Modena, MD;  Location: [facility] ENDOSCOPY;  Service: Endoscopy;  Laterality: N/A;  . FLEXIBLE SIGMOIDOSCOPY  03/29/2012   Procedure: FLEXIBLE SIGMOIDOSCOPY;  Surgeon: Petra Kuba, MD;  Location: [facility] ENDOSCOPY;  Service: Endoscopy;  Laterality: N/A;  fleet enema upon arrival  . HOT HEMOSTASIS  03/29/2012   Procedure: HOT HEMOSTASIS (ARGON PLASMA COAGULATION/BICAP);  Surgeon: Petra Kuba, MD;  Location: [facility] ENDOSCOPY;  Service: Endoscopy;  Laterality: N/A;  . IR GASTR TUBE CONVERT GASTR-JEJ PER W/FL MOD SED  02/23/2017  . IR GASTROSTOMY TUBE MOD SED  02/16/2017  . IR GENERIC HISTORICAL  06/30/2016   IR RADIOLOGIST EVAL & MGMT 06/30/2016 Irish Lack, MD [facility] INTERV RAD  . IR GENERIC HISTORICAL  09/09/2016   IR RADIOLOGIST EVAL & MGMT 09/09/2016 Irish Lack, MD [facility] INTERV RAD  . IR IMAGING GUIDED PORT INSERTION  08/29/2019  . IR IVC FILTER PLMT / S&I /IMG GUID/MOD SED  03/03/2017  . IR PATIENT EVAL TECH 0-60 MINS  05/12/2017  . IR RADIOLOGIST EVAL & MGMT  11/24/2017  . IR RADIOLOGIST EVAL & MGMT  04/07/2018  . IR RADIOLOGIST EVAL & MGMT  05/18/2019  . LAPAROSCOPY N/A  01/12/2017   Procedure: LAPAROSCOPY DIAGNOSTIC;  Surgeon: Almond Lint, MD;  Location: [facility] OR;  Service: General;  Laterality: N/A;  . LAPAROTOMY N/A 03/10/2017   Procedure: EXPLORATORY LAPAROTOMY Open jejunostomy tube;  Surgeon: Almond Lint, MD;  Location[facility] OR;  Service: General;  Laterality: N/A;  . LUMBAR LAMINECTOMY/DECOMPRESSION MICRODISCECTOMY N/A 02/10/2016   Procedure: Lumbar three- four Laminectomy;  Surgeon: Barnett Abu, MD;  Location: [facility] NEURO ORS;  Service: Neurosurgery;  Laterality: N/A;  L3-4 Laminectomy  . LUMBAR SPINE SURGERY     1st surgery "ray cage placed"  . THYROIDECTOMY  2005  . TONSILLECTOMY    . WHIPPLE PROCEDURE N/A 01/12/2017   Procedure: WHIPPLE PROCEDURE;  Surgeon: Almond Lint, MD;  Location: [facility] OR;  Service: General;  Laterality: N/A;   HPI:  84 y.o. female with history of papillar carcinoma (last chemo 08/02/20), CVA (2016), DVT (chronic coumadin), DM2, HLD, HTN admitted 08/09/20 with generalized weakness and SOB.  Presented with decreased arousal on 12/28. MRI: Numerous scattered small acute infarcts in both cerebral hemispheres, primarily affecting white matter of the MCA  territories.   Assessment / Plan / Recommendation Clinical Impression  Pt presents with deficits in  attention, recall, awareness, and problem-solving secondary to bilateral CVAs.  She is able to follow one and two-step commands; speech is intelligible and fluent; language comprehension and expression are [facility].  Confusion/orientation have been fluctuating.  Pt wil benefit from SLP to address basic cognition during this admission and in next level of care; discussed with son, who agrees with plan.    SLP Assessment  SLP Recommendation/Assessment: Patient needs continued Speech [place] Pathology Services SLP Visit Diagnosis: Cognitive communication deficit (R41.841)    Follow Up Recommendations  Skilled Nursing facility    Frequency and Duration min 2x/week  1 week      SLP  Evaluation Cognition  Overall Cognitive Status: Impaired/Different from baseline Arousal/Alertness: Awake/alert Orientation Level: Oriented X4 Attention: Selective Selective Attention: Impaired Selective Attention Impairment: Verbal basic Memory: Impaired Awareness: Impaired Awareness Impairment: Intellectual impairment Problem Solving: Impaired Safety/Judgment: Impaired       Comprehension  Auditory Comprehension Overall Auditory Comprehension: Appears within functional limits for tasks assessed Visual Recognition/Discrimination Discrimination: Within Function Limits Reading Comprehension Reading Status: Not tested    Expression Expression Primary Mode of Expression: Verbal Verbal Expression Overall Verbal Expression: Appears within functional limits for tasks assessed Written Expression Dominant Hand: Right   Oral / Motor  Oral Motor/Sensory Function Overall Oral Motor/Sensory Function: Within functional limits Motor Speech Overall Motor Speech: Appears within functional limits for tasks assessed   GO                    Juan Quam Laurice 08/15/2020, 4:55 PM  Kateri Balch L. Tivis Ringer, [place] Office number [phone] Pager [phone]

## 2020-08-15 NOTE — Progress Notes (Signed)
ANTICOAGULATION CONSULT NOTE  Pharmacy Consult for warfarin Indication: Hx DVT, CVA  Labs: Recent Labs    08/13/20 0330 08/13/20 1611 08/14/20 0357 08/15/20 0249  HGB  --   --  9.6*  --   HCT  --   --  29.2*  --   PLT  --   --  339  --   LABPROT 28.2*  --  28.4* 27.6*  INR 2.8*  --  2.8* 2.7*  CREATININE 2.24* 2.22* 2.24* 1.99*    Estimated Creatinine Clearance: 22.7 mL/min (A) (by C-G formula based on SCr of 1.99 mg/dL (H)).  Assessment: 84 yo female who presented with SOB. PTA the patient is on warfarin at home for hx of DVT. Of note, the patient has a history of rectus sheath hematoma while on enoxaparin and had and IVC filter placed.  INR upon admission was supratherapeutic at 3.7. The last reported dose of warfarin was on 12/23 @ 1900. Pharmacy is consulted to dose warfarin.   PTA warfarin dose: 7.5 mg PO daily   INR today is therapeutic after resuming warfarin yesterday evening (INR 2.7 << 2.8, goal of 2-3). CBC stable. Will continue with reduced dose and monitor INR trends closely.  Goal of Therapy:  INR 2-3 Monitor platelets by anticoagulation protocol: Yes   Plan:  - Warfarin 3 mg x 1 dose at 1800 today  - Daily PT/INR, CBC q72h - Will continue to monitor for any signs/symptoms of bleeding and will follow up with PT/INR in the a.m.    Thank you for allowing pharmacy to be a part of this patient's care.  Georgina Pillion, PharmD, BCPS Clinical Pharmacist Clinical phone for 08/15/2020: [phone] 08/15/2020 9:53 AM   **Pharmacist phone directory can now be found on [email or web address] ([email or web address]).  Listed under [facility].

## 2020-08-15 NOTE — Progress Notes (Signed)
[facility] PROGRESS NOTE    Progress Note  Kaylee Mullins  WUJ:[id number] DOB: [birth year] DOA: 08/09/2020 PCP: Fleet Contras, MD     Brief Narrative:   Kaylee Mullins is an 84 y.o. 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  Cardiology thought the patient was not fluid overloaded they agree with holding Lasix. Her creatinine is improving nicely.  Acute CVA: MRI of the brain showed Numerous scattered small acute infarcts in both cerebral hemispheres, primarily affecting white matter of the MCA territories. Neurology was consulted recommended to continue Coumadin. A1c of 8.5, HDL less than 40 LDL 21 PT, OT, Speech consult pending Continue statins further management per stroke team.  Chronic diastolic heart failure: Appears to be euvolemic renal function worsened due to IV diuresis.  Lasix was held. Creatinine improving.  Acute kidney injury: Baseline creatinine of around 1, likely due to overdiuresis continue to hold Lasix creatinine is improved nicely.  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  Abdominal ultrasound showed multiple liver metastases. We will wait for renal function to improve to try to perform a CT scan of the abdomen pelvis with contrast.  Diabetes mellitus type 2: Continue long-acting insulin plus sliding scale.  History of DVT: On Coumadin INR therapeutic.  Elevated troponins: Likely due to demand ischemia.  Hyperkalemia: She was given Kayexalate and IV fluids were potassium has improved.  Worsening leukocytosis: Question due  to metastatic disease, she continues to remain afebrile.  DVT prophylaxis: coumadin Family Communication:none Status is: Inpatient  Remains inpatient appropriate because:Hemodynamically unstable   Dispo: The patient is from: Home              Anticipated d/c is to: SNF              Anticipated d/c date is: 2 days              Patient currently is not medically stable to d/c.        Code Status:     Code Status Orders  (From admission, onward)         Start     Ordered   08/09/20 1940  Full code  Continuous        08/09/20 1939        Code Status History    Date Active Date Inactive Code Status Order ID Comments User Context   04/12/2019 1728 04/14/2019 2053 Full Code [id number]  Almon Hercules, MD ED   07/30/2017 1847 08/06/2017 1909 Full Code [id number]  Laurann Montana, PA-C ED   02/06/2017 0324 03/29/2017 2242 Full Code [id number]  Gaynelle Adu, MD Inpatient   01/12/2017 1521 02/05/2017 1855 Full Code [id number]  Almond Lint, MD Inpatient   07/15/2016 1524 07/16/2016 2003 Full Code [id number]  Irish Lack, MD Inpatient   02/10/2016 2046 02/15/2016 0002 Full Code [id number]  Barnett Abu, MD Inpatient   09/16/2014 1707 09/18/2014 1713 Full Code [id number]  Maretta Bees, MD Inpatient   09/11/2014 1646 09/13/2014 2219 Full Code [id number]  Russella Dar, NP Inpatient   Advance Care Planning Activity    Advance Directive Documentation  Flowsheet Row Most Recent Value  Type of Advance Directive Healthcare Power of Attorney  Pre-existing out of facility DNR order (yellow form or pink MOST form) --  "MOST" Form in Place? --        IV Access:    Peripheral IV   Procedures and diagnostic studies:   MR BRAIN WO CONTRAST  Result Date: 08/14/2020 CLINICAL DATA:  84 year old female with altered mental status and lethargy. Recent shortness of breath. History of pancreatic cancer. EXAM: MRI HEAD WITHOUT CONTRAST TECHNIQUE: Multiplanar, multiecho pulse sequences of the  brain and surrounding structures were obtained without intravenous contrast. COMPARISON:  Brain MRI and intracranial MRA 09/16/2014 FINDINGS: Brain: Widely scattered small foci of restricted diffusion in both frontal lobes, posterior temporal lobes, occasionally the occipital lobe white matter. White matter mostly affected. Deep gray nuclei, brainstem, and cerebellum are spared. No associated acute hemorrhage or mass effect. Superimposed chronic patchy bilateral cerebral white matter and bilateral deep gray nuclei T2 and FLAIR heterogeneity is largely stable since 2016. Expected evolution of chronic lacunar infarct of the right thalamus since 2016. Progressed chronic microhemorrhages in the bilateral thalami over the same time. Chronic lacunar infarct in the left pons is stable. Small chronic calcification along the medial right tentorium is stable. No signal abnormality specific for vasogenic edema. No IV contrast administered. No midline shift, mass effect, evidence of mass lesion, ventriculomegaly, extra-axial collection or acute intracranial hemorrhage. Cervicomedullary junction and pituitary are within normal limits. Vascular: Major intracranial vascular flow voids are stable since 2016. Generalized intracranial artery tortuosity. Skull and upper cervical spine: Stable bone marrow signal since 2016. Chronic hyperostosis of the calvarium. Negative for age visible cervical spine. Sinuses/Orbits: Postoperative changes to both globes since 2016. Paranasal Visualized paranasal sinuses and mastoids are stable and well pneumatized. Other: Grossly normal visible internal auditory structures. Negative visible scalp and face soft tissues. IMPRESSION: 1. Numerous scattered small acute infarcts in both cerebral hemispheres, primarily affecting white matter of the MCA territories. No associated hemorrhage or mass effect. Consider recent embolic event. A hypotensive episode might also have this appearance. 2. Underlying  advanced chronic small vessel disease, with some progression since 2016. 3. No strong evidence of metastatic disease on this noncontrast exam. Electronically Signed   By: Genevie Ann M.D.   On: 08/14/2020 06:19   US Abdomen Limited RUQ (LIVER/GB)  Result Date: 08/13/2020 CLINICAL DATA:  Elevated liver enzymes. History of pancreatic carcinoma EXAM: ULTRASOUND ABDOMEN LIMITED RIGHT UPPER QUADRANT COMPARISON:  CT abdomen and pelvis April 30, 2020 FINDINGS: Gallbladder: Surgically absent. Common bile duct: Diameter: 8 mm, within normal limits for post cholecystectomy state. No appreciable intrahepatic, common hepatic, or common bile duct dilatation. Liver: The liver has an overall increased, inhomogeneous echotexture with nodular contour. Complex partially cystic area arising in the anterior right lobe of the liver measures 5.9 x 4.4 x 6.7 cm. A complex mass in the anterior segment right lobe of the liver measures 3.5 x 2.5 x 4.0 cm. Smaller masslike areas are noted elsewhere throughout the liver, consistent with metastatic foci. Portal vein is patent on color Doppler imaging with normal direction of blood flow towards the liver. Other: None. IMPRESSION: 1. Liver has a nodular contour suggesting underlying cirrhosis. Multiple liver masses likely represent metastatic foci from known pancreatic carcinoma. Degenerating cystic lesion in the anterior segment right lobe of the liver potentially may represent residua from treated metastasis versus infected cystic area. Correlation with CT to assess for possible developing abscess in this area of  the liver may be reasonable. 2.  Gallbladder absent. These results will be called to the ordering clinician or representative by the Radiologist Assistant, and communication documented in the PACS or [employer]. Electronically Signed   By: Lowella Grip III M.D.   On: 08/13/2020 10:34     Medical Consultants:    None.  Anti-Infectives:   none  Subjective:     [name] mentation is improved this morning around currently on a conversation she has no new complaints.  She relates no discomfort.  Objective:    Vitals:   08/14/20 2012 08/15/20 0000 08/15/20 0507 08/15/20 0831  BP: (!) 111/46  (!) 129/53 (!) 129/58  Pulse: 60  (!) 57 (!) 57  Resp: 17  16   Temp: (!) 97.4 F (36.3 C)  98.1 F (36.7 C)   TempSrc: Oral  Oral   SpO2: 96%  99% 98%  Weight:  83.2 kg    Height:       SpO2: 98 % O2 Flow Rate (L/min): 3 L/min FiO2 (%): 100 %   Intake/Output Summary (Last 24 hours) at 08/15/2020 0848 Last data filed at 08/15/2020 0500 Gross per 24 hour  Intake 2758.1 ml  Output 850 ml  Net 1908.1 ml   Filed Weights   08/13/20 0355 08/14/20 0037 08/15/20 0000  Weight: 82.5 kg 82.6 kg 83.2 kg    Exam: General exam: In no acute distress. Respiratory system: Good air movement and clear to auscultation. Cardiovascular system: S1 & S2 heard, RRR. No JVD. Gastrointestinal system: Abdomen is nondistended, soft and nontender.  Extremities: No pedal edema. Skin: No rashes, lesions or ulcers  Data Reviewed:    Labs: Basic Metabolic Panel: Recent Labs  Lab 08/12/20 0316 08/13/20 0330 08/13/20 1611 08/14/20 0357 08/15/20 0249  NA 138 138 138 144 139  K 3.3* 5.5* 4.3 4.3 4.1  CL 100 102 102 107 104  CO2 28 24 26 26 25   GLUCOSE 116* 157* 132* 96 202*  BUN 38* 44* 46* 48* 49*  CREATININE 2.21* 2.24* 2.22* 2.24* 1.99*  CALCIUM 7.6* 7.5* 7.3* 7.5* 7.2*  MG 2.1  --   --   --   --    GFR Estimated Creatinine Clearance: 22.7 mL/min (A) (by C-G formula based on SCr of 1.99 mg/dL (H)). Liver Function Tests: Recent Labs  Lab 08/09/20 0900 08/10/20 0227 08/11/20 0421 08/12/20 0316 08/13/20 0330  AST 61* 67* 199* 159* 102*  ALT 25 30 57* 60* 50*  ALKPHOS 125 145* 176* 166* 185*  BILITOT 0.8 0.5 0.8 0.5 1.5*  PROT 6.1* 6.2* 6.1* 5.8* 6.0*  ALBUMIN 1.9* 1.8* 1.6* 1.6* 1.7*   No results for input(s): LIPASE, AMYLASE in the  last 168 hours. Recent Labs  Lab 08/13/20 2014  AMMONIA 27   Coagulation profile Recent Labs  Lab 08/11/20 0421 08/12/20 0316 08/13/20 0330 08/14/20 0357 08/15/20 0249  INR 3.9* 3.5* 2.8* 2.8* 2.7*   COVID-19 Labs  No results for input(s): DDIMER, FERRITIN, LDH, CRP in the last 72 hours.  Lab Results  Component Value Date   Davis NEGATIVE 08/09/2020    CBC: Recent Labs  Lab 08/09/20 0700 08/10/20 0227 08/11/20 0421 08/12/20 0316 08/14/20 0357  WBC 9.9 11.7* 13.0* 15.3* 22.8*  NEUTROABS 8.3*  --   --   --  18.0*  HGB 9.3* 8.9* 9.4* 8.9* 9.6*  HCT 28.5* 28.0* 29.1* 28.2* 29.2*  MCV 95.3 93.3 93.0 93.4 93.9  PLT 153 191 221 220 339  Cardiac Enzymes: No results for input(s): CKTOTAL, CKMB, CKMBINDEX, TROPONINI in the last 168 hours. BNP (last 3 results) No results for input(s): PROBNP in the last 8760 hours. CBG: Recent Labs  Lab 08/14/20 0615 08/14/20 1109 08/14/20 1606 08/14/20 2107 08/15/20 0607  GLUCAP 88 195* 156* 196* 184*   D-Dimer: No results for input(s): DDIMER in the last 72 hours. Hgb A1c: Recent Labs    08/15/20 0249  HGBA1C 8.5*   Lipid Profile: Recent Labs    08/15/20 0249  CHOL 50  HDL 13*  LDLCALC 21  TRIG 80  CHOLHDL 3.8   Thyroid function studies: No results for input(s): TSH, T4TOTAL, T3FREE, THYROIDAB in the last 72 hours.  Invalid input(s): FREET3 Anemia work up: No results for input(s): VITAMINB12, FOLATE, FERRITIN, TIBC, IRON, RETICCTPCT in the last 72 hours. Sepsis Labs: Recent Labs  Lab 08/10/20 0227 08/11/20 0421 08/12/20 0316 08/14/20 0357  WBC 11.7* 13.0* 15.3* 22.8*   Microbiology Recent Results (from the past 240 hour(s))  Resp Panel by RT-PCR (Flu A&B, Covid) Nasopharyngeal Swab     Status: None   Collection Time: 08/09/20  9:12 AM   Specimen: Nasopharyngeal Swab; Nasopharyngeal(NP) swabs in vial transport medium  Result Value Ref Range Status   SARS Coronavirus 2 by RT PCR NEGATIVE  NEGATIVE Final    Comment: (NOTE) SARS-CoV-2 target nucleic acids are NOT DETECTED.  The SARS-CoV-2 RNA is generally detectable in upper respiratory specimens during the acute phase of infection. The lowest concentration of SARS-CoV-2 viral copies this assay can detect is 138 copies/mL. A negative result does not preclude SARS-Cov-2 infection and should not be used as the sole basis for treatment or other patient management decisions. A negative result may occur with  improper specimen collection/handling, submission of specimen other than nasopharyngeal swab, presence of viral mutation(s) within the areas targeted by this assay, and inadequate number of viral copies(<138 copies/mL). A negative result must be combined with clinical observations, patient history, and epidemiological information. The expected result is Negative.  Fact Sheet for Patients:  BloggerCourse.comhttps://www.fda.gov/media/152166/download  Fact Sheet for Healthcare Providers:  SeriousBroker.ithttps://www.fda.gov/media/152162/download  This test is no t yet approved or cleared by the [place] FDA and  has been authorized for detection and/or diagnosis of SARS-CoV-2 by FDA under an Emergency Use Authorization (EUA). This EUA will remain  in effect (meaning this test can be used) for the duration of the COVID-19 declaration under Section 564(b)(1) of the Act, 21 U.S.C.section 360bbb-3(b)(1), unless the authorization is terminated  or revoked sooner.       Influenza A by PCR NEGATIVE NEGATIVE Final   Influenza B by PCR NEGATIVE NEGATIVE Final    Comment: (NOTE) The Xpert Xpress SARS-CoV-2/FLU/RSV plus assay is intended as an aid in the diagnosis of influenza from Nasopharyngeal swab specimens and should not be used as a sole basis for treatment. Nasal washings and aspirates are unacceptable for Xpert Xpress SARS-CoV-2/FLU/RSV testing.  Fact Sheet for Patients: BloggerCourse.comhttps://www.fda.gov/media/152166/download  Fact Sheet for Healthcare  Providers: SeriousBroker.ithttps://www.fda.gov/media/152162/download  This test is not yet approved or cleared by the [place] FDA and has been authorized for detection and/or diagnosis of SARS-CoV-2 by FDA under an Emergency Use Authorization (EUA). This EUA will remain in effect (meaning this test can be used) for the duration of the COVID-19 declaration under Section 564(b)(1) of the Act, 21 U.S.C. section 360bbb-3(b)(1), unless the authorization is terminated or revoked.  Performed at [facility], [address] [place], [place] [address]  Medications:   . carvedilol  25 mg Oral BID  . Chlorhexidine Gluconate Cloth  6 each Topical Daily  . docusate sodium  100 mg Oral Daily  . famotidine  20 mg Oral Daily  . feeding supplement (GLUCERNA SHAKE)  237 mL Oral TID BM  . insulin aspart  0-9 Units Subcutaneous TID WC  . insulin detemir  7 Units Subcutaneous BID  . levothyroxine  200 mcg Oral QAC breakfast  . melatonin  5 mg Oral QHS  . multivitamin with minerals  1 tablet Oral Daily  . pantoprazole  40 mg Oral QHS  . polyethylene glycol  17 g Oral BID  . sodium chloride flush  3 mL Intravenous Q12H  . Warfarin - Pharmacist Dosing Inpatient   Does not apply q1600   Continuous Infusions: . sodium chloride    . sodium chloride 100 mL/hr at 08/15/20 0529  . cefTRIAXone (ROCEPHIN)  IV 1 g (08/15/20 0351)      LOS: 5 days   Marinda Elk  [facility]  08/15/2020, 8:48 AM

## 2020-08-15 NOTE — Progress Notes (Signed)
Carotid completed   Please see CV Proc for preliminary results.   Kayceon Oki, RVT  

## 2020-08-15 NOTE — Progress Notes (Addendum)
STROKE TEAM PROGRESS NOTE   INTERVAL HISTORY Her son is at the bedside.  Patient is drowsy, but oriented to name, age.  Vitals:   08/15/20 0000 08/15/20 0507 08/15/20 0831 08/15/20 1213  BP:  (!) 129/53 (!) 129/58 (!) 117/49  Pulse:  (!) 57 (!) 57 (!) 59  Resp:  16  18  Temp:  98.1 F (36.7 C)  98.3 F (36.8 C)  TempSrc:  Oral  Oral  SpO2:  99% 98% 100%  Weight: 83.2 kg     Height:       CBC:  Recent Labs  Lab 08/09/20 0700 08/10/20 0227 08/12/20 0316 08/14/20 0357  WBC 9.9   < > 15.3* 22.8*  NEUTROABS 8.3*  --   --  18.0*  HGB 9.3*   < > 8.9* 9.6*  HCT 28.5*   < > 28.2* 29.2*  MCV 95.3   < > 93.4 93.9  PLT 153   < > 220 339   < > = values in this interval not displayed.   Basic Metabolic Panel:  Recent Labs  Lab 08/12/20 0316 08/13/20 0330 08/14/20 0357 08/15/20 0249  NA 138   < > 144 139  K 3.3*   < > 4.3 4.1  CL 100   < > 107 104  CO2 28   < > 26 25  GLUCOSE 116*   < > 96 202*  BUN 38*   < > 48* 49*  CREATININE 2.21*   < > 2.24* 1.99*  CALCIUM 7.6*   < > 7.5* 7.2*  MG 2.1  --   --   --    < > = values in this interval not displayed.   Lipid Panel:  Recent Labs  Lab 08/15/20 0249  CHOL 50  TRIG 80  HDL 13*  CHOLHDL 3.8  VLDL 16  LDLCALC 21   HgbA1c:  Recent Labs  Lab 08/15/20 0249  HGBA1C 8.5*    IMAGING past 24 hours VAS US CAROTID  Result Date: 08/15/2020 Carotid Arterial Duplex Study Indications:       CVA. Risk Factors:      Hypertension, hyperlipidemia, Diabetes, no history of                    smoking. Comparison Study:  Prev 09/2016 Performing Technologist: Vonzell Schlatter RVT  Examination Guidelines: A complete evaluation includes B-mode imaging, spectral Doppler, color Doppler, and power Doppler as needed of all accessible portions of each vessel. Bilateral testing is considered an integral part of a complete examination. Limited examinations for reoccurring indications may be performed as noted.  Right Carotid Findings:  +----------+--------+--------+--------+------------------+--------+           PSV cm/sEDV cm/sStenosisPlaque DescriptionComments +----------+--------+--------+--------+------------------+--------+ CCA Prox  58      11                                         +----------+--------+--------+--------+------------------+--------+ CCA Distal56      12                                         +----------+--------+--------+--------+------------------+--------+ ICA Prox  59      14      1-39%   heterogenous               +----------+--------+--------+--------+------------------+--------+  ICA Distal68      15                                         +----------+--------+--------+--------+------------------+--------+ ECA       63                                                 +----------+--------+--------+--------+------------------+--------+ +----------+--------+-------+--------+-------------------+           PSV cm/sEDV cmsDescribeArm Pressure (mmHG) +----------+--------+-------+--------+-------------------+ [id number]                                        +----------+--------+-------+--------+-------------------+ +---------+--------+--+--------+--+ VertebralPSV cm/s69EDV cm/s14 +---------+--------+--+--------+--+  Left Carotid Findings: +----------+--------+--------+--------+------------------+--------+           PSV cm/sEDV cm/sStenosisPlaque DescriptionComments +----------+--------+--------+--------+------------------+--------+ CCA Prox  82      20                                         +----------+--------+--------+--------+------------------+--------+ CCA Distal67      13                                         +----------+--------+--------+--------+------------------+--------+ ICA Prox  65      18      1-39%   heterogenous               +----------+--------+--------+--------+------------------+--------+ ICA Distal48      10                                 tortuous +----------+--------+--------+--------+------------------+--------+ ECA       54                                                 +----------+--------+--------+--------+------------------+--------+ +----------+--------+--------+--------+-------------------+           PSV cm/sEDV cm/sDescribeArm Pressure (mmHG) +----------+--------+--------+--------+-------------------+ [id number]                                         +----------+--------+--------+--------+-------------------+ +---------+--------+--+--------+-+ VertebralPSV cm/s44EDV cm/s8 +---------+--------+--+--------+-+   Summary: Right Carotid: Velocities in the right ICA are consistent with a 1-39% stenosis. Left Carotid: Velocities in the left ICA are consistent with a 1-39% stenosis. Vertebrals:  Bilateral vertebral arteries demonstrate antegrade flow. Subclavians: Normal flow hemodynamics were seen in bilateral subclavian              arteries. *See table(s) above for measurements and observations.     Preliminary     PHYSICAL EXAM Physical Exam  Constitutional: Appears well-developed and well-nourished.  Psych: Affect appropriate to situation Eyes: Normal external eye and conjunctiva. HENT: Normocephalic, no lesions, without obvious abnormality.   Musculoskeletal- BLE edema Cardiovascular: Normal rate and  regular rhythm.  Respiratory: Effort normal, non-labored breathing saturations WNL GI: Soft.  No distension. There is no tenderness.  Skin: WDI   Neuro:  Mental Status: drowsy, oriented to name and age, thought content appropriate.  Speech fluent without evidence of aphasia.  Able to follow 3 step commands without difficulty. Cranial Nerves: II: blinks to threat bilaterally  III,IV, VI: ptosis not present, able to track examiner with continued stimulation and prompting,  pupils equal, round, reactive to light and accommodation V,VII: smile symmetric, facial light  touch sensation normal bilaterally VIII: hearing normal bilaterally IX,X: uvula rises symmetrically XI: bilateral shoulder shrug XII: midline tongue extension Motor: BUE 5/5 able to break gravity BLE unable to break gravity. Tone and bulk:normal tone throughout; no atrophy noted Sensory: Pinprick and light touch intact throughout, bilaterally Deep Tendon Reflexes: 1+ patella, 2+ BUE Cerebellar: bilateral dysmetria present with FNF  Gait: deferred     ASSESSMENT/PLAN Ms. TERAN SCHUTTLER is a 84 y.o. 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   Stroke:  bilateral cerebral hemispheres infarct more watershed distribution, concerning related to sepsis and relatively low BP. Her INR therapeutic, unlikely cardioembolic cause.  MRI  1. Numerous scattered small acute infarcts in both cerebral hemispheres, primarily affecting white matter of the MCA territories.  No associated hemorrhage or mass effect.  MRA  Head: neg  Carotid Doppler  neg  2D Echo EF 50-55%  LDL 21  HgbA1c 8.5  VTE prophylaxis - coumadin    Diet   Diet heart healthy/carb modified Room service appropriate? Yes; Fluid consistency: Thin     warfarin daily prior to admission, now on warfarin daily.   Therapy recommendations:  pending  Disposition:  pending  Hypertension  Home meds: carvedilol,  nifedipine  Stable . Permissive hypertension (OK if < 220/120) but gradually normalize in 5-7 days . Long-term BP goal normotensive  Hyperlipidemia  Home meds:  crestor 20 mg, resumed in hospital  LDL 21, goal < 70  Continue statin at discharge  ? Atrial fibrillation  30 day monitor  telemetry  Diabetes type II Uncontrolled  Home meds:  Levemir, humalog  HgbA1c 8.5, goal < 7.0  CBGs Recent Labs    08/14/20 2107 08/15/20 0607 08/15/20 1208  GLUCAP 196* 184* 167*       SSI  Other Stroke Risk Factors  Advanced Age >/= 42   Obesity, Body mass index is 31.48 kg/m., BMI >/= 30 associated with increased stroke risk, recommend weight loss, diet and exercise as appropriate   Advised against substance abuse  Hx stroke  Coronary artery disease  Other Active Problems  DVT: on chronic coumadin  Hospital day # [place], MSN, NP-C [facility] [phone]  ATTENDING NOTE: I reviewed above note and agree with the assessment and plan. Pt was seen and examined.   84 year old female with history of diabetes, hypertension, hyperlipidemia, DVT on Coumadin, thyroid cancer status post surgery, history of stroke admitted for shortness of breath, general weakness and lower extremity sweating.  She was treated with BiPAP and improved respiratory status, however found to have drowsy lethargic and altered mental status.  MRI showed bilateral small/punctate infarcts along MCA/PCA, MCA/ACA, M1/M2 regions, more concerning for watershed infarcts.  MRA and carotid Doppler negative.  EF 50 to 55%, A1c of 8.5, LDL 21.  INR 3.5->2.8->2.7.  Creatinine 0.88-> 1.22-> 2.24-> 1.99.  She was found to  have UTI with UA WBC > 50.  Currently on Rocephin.  However her WBC trending up from 15.3 to 22.8.  Her BP was soft, around 100-120.  Patient has history of stroke in 08/2014 for right thalamic infarct.  In 09/2014, admitted again for recrudescence of stroke symptoms, MRI, MRA, carotid Doppler and 2D echo unremarkable.  Continue on Plavix.  On exam, patient lethargic, however awake alert, orientated.  No aphasia, able to name and repeat, follow simple commands, however has mild perseveration.  Visual field fall, no gaze preference.  PERRL.  Right facial nasolabial fold flattening.  Right upper extremity drift with proximal and distal 4/5.  Left upper extremity no drift.  Lateral lower extremity proximal 2/5, distal 4/5.  Light touch sensation decreased on the right  side.  Finger-to-nose intact bilaterally.  Gait not tested  Etiology for patient stroke likely due to sepsis and soft BP given watershed distribution.  Patient on Coumadin INR therapeutic, less likely to be cardioembolic at this time.  Noted that patient on Coreg 25 mg twice daily, will decrease to 12.5 mg twice daily and watch for blood pressure and heart rate.  Avoid low BP, continue treat for infection, leukocytosis as per primary team.  Continue Coumadin with INR goal 2-3.  Neurology will sign off. Please call with questions. Pt will follow up with stroke clinic Dr. Pearlean Brownie at [facility] in about 4 weeks. Thanks for the consult.   Marvel Plan, MD PhD Stroke Neurology 08/15/2020 6:33 PM    To contact Stroke Continuity provider, please refer to [email or web address]. After hours, contact General Neurology

## 2020-08-15 NOTE — Evaluation (Signed)
Occupational Therapy Evaluation Patient Details Name: Kaylee Mullins MRN: [id number] DOB: 1935-10-26 Today's Date: 08/15/2020    History of Present Illness Pt is an 84 y.o. female admitted 08/09/20 with generalized weakness and SOB; reports she has not been feeling well since chemo tx last Friday. Workup for acute hypoxic respiratory failure, initially requiring BiPAP. Pt with decr arousal on 12/28 and MRI on 12/29 showed multifocal bilateral patchy acute infarctions of the cerebral hemispheres bilaterally. PMH includes CVA, CKD, DM, HTN, SVT, OA, depression, anxiety.   Clinical Impression   Pt was living alone with intermittent assist of her family. She walked with a rollator. Pt presents with impaired cognition, pain in her buttocks and significant, global weakness. She requires min to total assist for ADL and +2 total assist for bed level mobility. She demonstrates poor sitting balance. Pt will need rehab in SNF upon discharge. Will follow acutely.      Follow Up Recommendations  SNF;Supervision/Assistance - 24 hour    Equipment Recommendations  3 in 1 bedside commode;Wheelchair (measurements OT);Wheelchair cushion (measurements OT);Hospital bed    Recommendations for Other Services       Precautions / Restrictions Precautions Precautions: Fall Precaution Comments: incontinence      Mobility Bed Mobility Overal bed mobility: Needs Assistance Bed Mobility: Supine to Sit;Sit to Supine;Rolling Rolling: Total assist   Supine to sit: +2 for physical assistance;Total assist Sit to supine: +2 for physical assistance;Total assist   General bed mobility comments: pt without initiation of movement, assist for all aspects    Transfers                 General transfer comment: deferred    Balance Overall balance assessment: Needs assistance                                         ADL either performed or assessed with clinical judgement   ADL Overall  ADL's : Needs assistance/impaired Eating/Feeding: Minimal assistance;Bed level   Grooming: Wash/dry hands;Bed level;Set up   Upper Body Bathing: Total assistance;Bed level   Lower Body Bathing: Total assistance;Bed level   Upper Body Dressing : Maximal assistance;Bed level   Lower Body Dressing: Total assistance;Bed level       Toileting- Clothing Manipulation and Hygiene: Total assistance;Bed level;+2 for physical assistance               Vision Patient Visual Report: No change from baseline       Perception     Praxis      Pertinent Vitals/Pain Pain Assessment: Faces Faces Pain Scale: Hurts whole lot Pain Location: bottom, not able to state specifically Pain Descriptors / Indicators: Grimacing;Guarding Pain Intervention(s): Repositioned     Hand Dominance Right   Extremity/Trunk Assessment Upper Extremity Assessment Upper Extremity Assessment: Generalized weakness (4-/5)   Lower Extremity Assessment Lower Extremity Assessment: Defer to PT evaluation   Cervical / Trunk Assessment Cervical / Trunk Assessment: Kyphotic   Communication Communication Communication: HOH   Cognition Arousal/Alertness: Awake/alert Behavior During Therapy: Flat affect Overall Cognitive Status: Impaired/Different from baseline Area of Impairment: Orientation;Attention;Memory;Following commands;Safety/judgement;Awareness;Problem solving                 Orientation Level: Disoriented to;Situation;Time Current Attention Level: Selective Memory: Decreased short-term memory Following Commands: Follows one step commands inconsistently;Follows one step commands with increased time Safety/Judgement: Decreased awareness of safety;Decreased awareness of deficits Awareness:  Intellectual Problem Solving: Requires verbal cues;Slow processing     General Comments       Exercises     Shoulder Instructions      Home Living Family/patient expects to be discharged to:: Private  residence Living Arrangements: Alone Available Help at Discharge: Family;Available PRN/intermittently Type of Home: House Home Access: Stairs to enter     Home Layout: Multi-level Alternate Level Stairs-Number of Steps: Flight - uses stair lift Alternate Level Stairs-Rails: Right Bathroom Shower/Tub: Producer, television/film/video: Standard     Home Equipment: Environmental consultant - 4 wheels   Additional Comments: Lives alone; daughter and son nearby to check on and assist as needed      Prior Functioning/Environment Level of Independence: Independent with assistive device(s)        Comments: Mod indep with rollator, uses stair lift at home. Requiring increased assist from children due to fall and weakness during week leading to admission        OT Problem List: Decreased strength;Decreased activity tolerance;Impaired balance (sitting and/or standing);Decreased cognition;Decreased knowledge of use of DME or AE      OT Treatment/Interventions: Self-care/ADL training;Neuromuscular education;DME and/or AE instruction;Therapeutic activities;Cognitive remediation/compensation;Patient/family education;Balance training    OT Goals(Current goals can be found in the care plan section) Acute Rehab OT Goals Patient Stated Goal: to return home OT Goal Formulation: With patient/family Time For Goal Achievement: 08/29/20 Potential to Achieve Goals: Fair ADL Goals Pt Will Perform Grooming: with min assist;sitting Pt Will Perform Upper Body Dressing: with min assist;sitting Additional ADL Goal #1: Pt will perform bed mobility with + 1 max assist in preparation for ADL. Additional ADL Goal #2: Pt will sit EOB x 10 min with supervision in preparation for ADL. Additional ADL Goal #3: Pt will standing with +2 mod assist from elevated surface as a precursor to ADL. Additional ADL Goal #4: Pt will follow multistep commands with 50% accuracy.  OT Frequency: Min 3X/week   Barriers to D/C:             Co-evaluation              AM-PAC OT "6 Clicks" Daily Activity     Outcome Measure Help from another person eating meals?: A Little Help from another person taking care of personal grooming?: A Little Help from another person toileting, which includes using toliet, bedpan, or urinal?: Total Help from another person bathing (including washing, rinsing, drying)?: Total Help from another person to put on and taking off regular upper body clothing?: A Lot Help from another person to put on and taking off regular lower body clothing?: Total 6 Click Score: 11   End of Session    Activity Tolerance: Patient limited by pain Patient left: in bed;with call bell/phone within reach;with bed alarm set;with family/visitor present  OT Visit Diagnosis: Other symptoms and signs involving cognitive function;Muscle weakness (generalized) (M62.81)                Time: 9563-8756 OT Time Calculation (min): 16 min Charges:  OT General Charges $OT Visit: 1 Visit OT Evaluation $OT Eval Moderate Complexity: 1 Mod  Martie Round, OTR/L Acute Rehabilitation Services Pager: [phone] Office: [phone]  Evern Bio 08/15/2020, 3:39 PM

## 2020-08-15 NOTE — Progress Notes (Signed)
Physical Therapy Treatment Patient Details Name: Kaylee Mullins MRN: [id number] DOB: September 30, 1935 Today's Date: 08/15/2020    History of Present Illness Pt is an 84 y.o. female admitted 08/09/20 with generalized weakness and SOB; reports she has not been feeling well since chemo tx last Friday. Workup for acute hypoxic respiratory failure, initially requiring BiPAP. Pt with decr arousal on 12/28 and MRI on 12/29 showed multifocal bilateral patchy acute infarctions of the cerebral hemispheres bilaterally. PMH includes CVA, CKD, DM, HTN, SVT, OA, depression, anxiety.    PT Comments    Pt now with new diagnosis of acute CVA's. Pt unable to tolerate sitting EOB for more than a few seconds due to perineal pain. Had to return pt to supine. Notified nurse of pt's pain.    Follow Up Recommendations  SNF;Supervision for mobility/OOB     Equipment Recommendations  3in1 (PT);Wheelchair (measurements PT);Wheelchair cushion (measurements PT);Other (comment) (mechanical lift)    Recommendations for Other Services       Precautions / Restrictions Precautions Precautions: Fall;Other (comment) Precaution Comments: incontinence    Mobility  Bed Mobility Overal bed mobility: Needs Assistance Bed Mobility: Supine to Sit;Sit to Supine     Supine to sit: Max assist;+2 for physical assistance Sit to supine: Max assist;+2 for physical assistance   General bed mobility comments: Assist for all aspects  Transfers                 General transfer comment: Pt could not tolerate due to pain  Ambulation/Gait                 Stairs             Wheelchair Mobility    Modified Rankin (Stroke Patients Only) Modified Rankin (Stroke Patients Only) Pre-Morbid Rankin Score: Moderate disability Modified Rankin: Severe disability     Balance Overall balance assessment: Needs assistance Sitting-balance support: Feet supported;Bilateral upper extremity supported Sitting  balance-Leahy Scale: Poor Sitting balance - Comments: Pt only able to tolerate sitting EOB 10 sec due to pain in bottom and perineal area. Had to return to supine Postural control: Right lateral lean                                  Cognition Arousal/Alertness: Awake/alert Behavior During Therapy: Flat affect Overall Cognitive Status: Impaired/Different from baseline Area of Impairment: Attention;Following commands;Awareness;Problem solving                   Current Attention Level: Selective   Following Commands: Follows one step commands consistently   Awareness: Emergent Problem Solving: Requires verbal cues;Slow processing        Exercises General Exercises - Lower Extremity Ankle Circles/Pumps: AROM;10 reps;Supine;Both Short Arc Quad: AAROM;Both;10 reps;Supine Heel Slides: AAROM;Both;10 reps;Supine Hip ABduction/ADduction: AAROM;Both;10 reps;Supine    General Comments        Pertinent Vitals/Pain Pain Assessment: Faces Faces Pain Scale: Hurts whole lot Pain Location: "bottom"/perineal area Pain Descriptors / Indicators: Grimacing;Guarding (crying out) Pain Intervention(s): Limited activity within patient's tolerance;Repositioned;Other (comment) (RN notified)    Home Living                      Prior Function            PT Goals (current goals can now be found in the care plan section) Progress towards PT goals: Not progressing toward goals - comment (due to pain)  Frequency    Min 2X/week      PT Plan Current plan remains appropriate    Co-evaluation              AM-PAC PT "6 Clicks" Mobility   Outcome Measure  Help needed turning from your back to your side while in a flat bed without using bedrails?: Total Help needed moving from lying on your back to sitting on the side of a flat bed without using bedrails?: Total Help needed moving to and from a bed to a chair (including a wheelchair)?: Total Help needed  standing up from a chair using your arms (e.g., wheelchair or bedside chair)?: Total Help needed to walk in hospital room?: Total Help needed climbing 3-5 steps with a railing? : Total 6 Click Score: 6    End of Session   Activity Tolerance: Patient limited by pain Patient left: in bed;with call bell/phone within reach;with bed alarm set Nurse Communication: Mobility status;Other (comment) (perineal pain) PT Visit Diagnosis: Other abnormalities of gait and mobility (R26.89);Muscle weakness (generalized) (M62.81)     Time: 7619-5093 PT Time Calculation (min) (ACUTE ONLY): 15 min  Charges:  $Therapeutic Activity: 8-22 mins                     [facility] PT Acute Rehabilitation Services Pager [phone] Office [phone]    Angelina Ok [facility] 08/15/2020, 10:47 AM

## 2020-08-16 ENCOUNTER — Other Ambulatory Visit: Payer: Self-pay | Admitting: Physician Assistant

## 2020-08-16 DIAGNOSIS — I5033 Acute on chronic diastolic (congestive) heart failure: Secondary | ICD-10-CM | POA: Diagnosis not present

## 2020-08-16 DIAGNOSIS — R7989 Other specified abnormal findings of blood chemistry: Secondary | ICD-10-CM | POA: Diagnosis not present

## 2020-08-16 DIAGNOSIS — I639 Cerebral infarction, unspecified: Secondary | ICD-10-CM

## 2020-08-16 LAB — GLUCOSE, CAPILLARY
Glucose-Capillary: 112 mg/dL — ABNORMAL HIGH (ref 70–99)
Glucose-Capillary: 110 mg/dL — ABNORMAL HIGH (ref 70–99)
Glucose-Capillary: 169 mg/dL — ABNORMAL HIGH (ref 70–99)
Glucose-Capillary: 168 mg/dL — ABNORMAL HIGH (ref 70–99)

## 2020-08-16 LAB — BASIC METABOLIC PANEL
Anion gap: 6 (ref 5–15)
BUN: 40 mg/dL — ABNORMAL HIGH (ref 8–23)
CO2: 27 mmol/L (ref 22–32)
Calcium: 7.3 mg/dL — ABNORMAL LOW (ref 8.9–10.3)
Chloride: 107 mmol/L (ref 98–111)
Creatinine, Ser: 1.64 mg/dL — ABNORMAL HIGH (ref 0.44–1.00)
GFR, Estimated: 31 mL/min — ABNORMAL LOW (ref >60)
Glucose, Bld: 131 mg/dL — ABNORMAL HIGH (ref 70–99)
Potassium: 3.8 mmol/L (ref 3.5–5.1)
Sodium: 140 mmol/L (ref 135–145)

## 2020-08-16 LAB — URINE CULTURE: Culture: >=100000 — AB

## 2020-08-16 LAB — PROTIME-INR
INR: 2.9 — ABNORMAL HIGH (ref 0.8–1.2)
Prothrombin Time: 29 seconds — ABNORMAL HIGH (ref 11.4–15.2)

## 2020-08-16 MED ORDER — WARFARIN SODIUM 1 MG PO TABS
1.0000 mg | ORAL_TABLET | Freq: Once | ORAL | Status: AC
  Administered 2020-08-16: 1 mg via ORAL
  Filled 2020-08-16: qty 1

## 2020-08-16 MED ORDER — LIP MEDEX EX OINT
TOPICAL_OINTMENT | CUTANEOUS | Status: DC | PRN
  Filled 2020-08-16: qty 7

## 2020-08-16 NOTE — Progress Notes (Signed)
[facility] PROGRESS NOTE    Progress Note  Kaylee Mullins  [id number] DOB: February 07, 1936 DOA: 08/09/2020 PCP: Nolene Ebbs, MD     Brief Narrative:   Kaylee Mullins is an 84 y.o. 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  Cardiology thought the patient was not fluid overloaded they agree with holding Lasix. Her creatinine is improving nicely.  Acute CVA: MRI of the brain showed Numerous scattered small acute infarcts in both cerebral hemispheres, primarily affecting white matter of the MCA territories. MRA head negative. Neurology was consulted recommended to continue Coumadin. A1c of 8.5, HDL less than 40 LDL 21 PT, OT, Speech consult pending Continue statins. Further management per stroke team. Physical therapy evaluated the patient recommended skilled nursing facility Neurology recommended a 30-day monitor as an outpatient to rule out A. fib.  Chronic diastolic heart failure: Appears to be euvolemic renal function worsened due to IV diuresis.  Lasix was held. Creatinine improving.  Acute kidney injury: Baseline creatinine of around 1. Likely due to overdiuresis continue to hold Lasix creatinine is improving.  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  Abdominal ultrasound showed multiple liver metastases. We will wait for renal function to improve to try to perform a CT scan of the abdomen pelvis with contrast.  Diabetes mellitus type 2: Continue long-acting insulin plus sliding scale.  History of DVT: On Coumadin INR therapeutic.  Elevated  troponins: Likely due to demand ischemia.  Hyperkalemia: She was given Kayexalate and IV fluids were potassium has improved.  Worsening leukocytosis: Question due to metastatic disease, she continues to remain afebrile.  DVT prophylaxis: coumadin Family Communication:none Status is: Inpatient  Remains inpatient appropriate because:Hemodynamically unstable   Dispo: The patient is from: Home              Anticipated d/c is to: SNF              Anticipated d/c date is1 days              Patient currently is not medically stable to d/c.        Code Status:     Code Status Orders  (From admission, onward)         Start     Ordered   08/09/20 1940  Full code  Continuous        08/09/20 1939        Code Status History    Date Active Date Inactive Code Status Order ID Comments User Context   04/12/2019 1728 04/14/2019 2053 Full Code [id number]  Mercy Riding, MD ED   07/30/2017 1847 08/06/2017 1909 Full Code [id number]  Charlie Pitter, PA-C ED   02/06/2017 0324 03/29/2017 2242 Full Code [id number]  Greer Pickerel, MD Inpatient   01/12/2017 1521 02/05/2017 1855 Full Code [id number]  Stark Klein, MD Inpatient   07/15/2016 1524 07/16/2016 2003 Full Code [id number]  Aletta Edouard, MD Inpatient   02/10/2016 2046 02/15/2016 0002 Full Code [id number]  Kristeen Miss, MD Inpatient   09/16/2014 1707 09/18/2014 1713 Full Code [id number]  Jonetta Osgood, MD Inpatient   09/11/2014 1646 09/13/2014 2219  Full Code [id number]  Samella Parr, NP Inpatient   Advance Care Planning Activity    Advance Directive Documentation   Flowsheet Row Most Recent Value  Type of Advance Directive Healthcare Power of Attorney  Pre-existing out of facility DNR order (yellow form or pink MOST form) -  "MOST" Form in Place? -        IV Access:    Peripheral IV   Procedures and diagnostic studies:   MR ANGIO HEAD WO CONTRAST  Result Date: 08/15/2020 CLINICAL DATA:  Acute infarcts on MRI brain EXAM: MRA  HEAD WITHOUT CONTRAST TECHNIQUE: Angiographic images of the Circle of Willis were obtained using MRA technique without intravenous contrast. COMPARISON:  None. FINDINGS: Intracranial internal carotid arteries are patent. Middle and anterior cerebral arteries are patent. Intracranial vertebral arteries, basilar artery, posterior cerebral arteries are patent. Posterior communicating artery is identified on the left. There is no significant stenosis or aneurysm. IMPRESSION: Normal MRA of the head. Electronically Signed   By: Macy Mis M.D.   On: 08/15/2020 14:53   VAS US CAROTID  Result Date: 08/15/2020 Carotid Arterial Duplex Study Indications:       CVA. Risk Factors:      Hypertension, hyperlipidemia, Diabetes, no history of                    smoking. Comparison Study:  Prev 09/2016 Performing Technologist: Vonzell Schlatter RVT  Examination Guidelines: A complete evaluation includes B-mode imaging, spectral Doppler, color Doppler, and power Doppler as needed of all accessible portions of each vessel. Bilateral testing is considered an integral part of a complete examination. Limited examinations for reoccurring indications may be performed as noted.  Right Carotid Findings: +----------+--------+--------+--------+------------------+--------+           PSV cm/sEDV cm/sStenosisPlaque DescriptionComments +----------+--------+--------+--------+------------------+--------+ CCA Prox  58      11                                         +----------+--------+--------+--------+------------------+--------+ CCA Distal56      12                                         +----------+--------+--------+--------+------------------+--------+ ICA Prox  59      14      1-39%   heterogenous               +----------+--------+--------+--------+------------------+--------+ ICA Distal68      15                                         +----------+--------+--------+--------+------------------+--------+  ECA       63                                                 +----------+--------+--------+--------+------------------+--------+ +----------+--------+-------+--------+-------------------+           PSV cm/sEDV cmsDescribeArm Pressure (mmHG) +----------+--------+-------+--------+-------------------+ [id number]                                        +----------+--------+-------+--------+-------------------+ +---------+--------+--+--------+--+  VertebralPSV cm/s69EDV cm/s14 +---------+--------+--+--------+--+  Left Carotid Findings: +----------+--------+--------+--------+------------------+--------+           PSV cm/sEDV cm/sStenosisPlaque DescriptionComments +----------+--------+--------+--------+------------------+--------+ CCA Prox  82      20                                         +----------+--------+--------+--------+------------------+--------+ CCA Distal67      13                                         +----------+--------+--------+--------+------------------+--------+ ICA Prox  65      18      1-39%   heterogenous               +----------+--------+--------+--------+------------------+--------+ ICA Distal48      10                                tortuous +----------+--------+--------+--------+------------------+--------+ ECA       54                                                 +----------+--------+--------+--------+------------------+--------+ +----------+--------+--------+--------+-------------------+           PSV cm/sEDV cm/sDescribeArm Pressure (mmHG) +----------+--------+--------+--------+-------------------+ [id number]                                         +----------+--------+--------+--------+-------------------+ +---------+--------+--+--------+-+ VertebralPSV cm/s44EDV cm/s8 +---------+--------+--+--------+-+   Summary: Right Carotid: Velocities in the right ICA are consistent with a 1-39% stenosis. Left  Carotid: Velocities in the left ICA are consistent with a 1-39% stenosis. Vertebrals:  Bilateral vertebral arteries demonstrate antegrade flow. Subclavians: Normal flow hemodynamics were seen in bilateral subclavian              arteries. *See table(s) above for measurements and observations.  Electronically signed by Antony Contras MD on 08/15/2020 at 5:49:54 PM.    Final      Medical Consultants:    None.  Anti-Infectives:   none  Subjective:    [name] no new complaints.  Objective:    Vitals:   08/15/20 1942 08/16/20 0110 08/16/20 0343 08/16/20 0400  BP: (!) 115/52 (!) 119/57 140/62   Pulse: (!) 59  (!) 55   Resp: 16 20 17    Temp: 97.9 F (36.6 C) 98.3 F (36.8 C) 97.7 F (36.5 C)   TempSrc: Oral Oral Oral   SpO2: 100% 100%    Weight:    86.1 kg  Height:       SpO2: 100 % O2 Flow Rate (L/min): 3 L/min FiO2 (%): 100 %   Intake/Output Summary (Last 24 hours) at 08/16/2020 0930 Last data filed at 08/16/2020 0100 Gross per 24 hour  Intake 220 ml  Output 350 ml  Net -130 ml   Filed Weights   08/14/20 0037 08/15/20 0000 08/16/20 0400  Weight: 82.6 kg 83.2 kg 86.1 kg    Exam: General exam: In no acute distress. Respiratory system: Good air movement and clear to auscultation. Cardiovascular system: S1 & S2  heard, RRR. No JVD. Gastrointestinal system: Abdomen is nondistended, soft and nontender.  Extremities: No pedal edema. Skin: No rashes, lesions or ulcers Psychiatry: Judgement and insight appear normal. Mood & affect appropriate.  Data Reviewed:    Labs: Basic Metabolic Panel: Recent Labs  Lab 08/12/20 0316 08/13/20 0330 08/13/20 1611 08/14/20 0357 08/15/20 0249 08/16/20 0254  NA 138 138 138 144 139 140  K 3.3* 5.5* 4.3 4.3 4.1 3.8  CL 100 102 102 107 104 107  CO2 28 24 26 26 25 27   GLUCOSE 116* 157* 132* 96 202* 131*  BUN 38* 44* 46* 48* 49* 40*  CREATININE 2.21* 2.24* 2.22* 2.24* 1.99* 1.64*  CALCIUM 7.6* 7.5* 7.3* 7.5* 7.2* 7.3*   MG 2.1  --   --   --   --   --    GFR Estimated Creatinine Clearance: 28.1 mL/min (A) (by C-G formula based on SCr of 1.64 mg/dL (H)). Liver Function Tests: Recent Labs  Lab 08/10/20 0227 08/11/20 0421 08/12/20 0316 08/13/20 0330  AST 67* 199* 159* 102*  ALT 30 57* 60* 50*  ALKPHOS 145* 176* 166* 185*  BILITOT 0.5 0.8 0.5 1.5*  PROT 6.2* 6.1* 5.8* 6.0*  ALBUMIN 1.8* 1.6* 1.6* 1.7*   No results for input(s): LIPASE, AMYLASE in the last 168 hours. Recent Labs  Lab 08/13/20 2014  AMMONIA 27   Coagulation profile Recent Labs  Lab 08/12/20 0316 08/13/20 0330 08/14/20 0357 08/15/20 0249 08/16/20 0254  INR 3.5* 2.8* 2.8* 2.7* 2.9*   COVID-19 Labs  No results for input(s): DDIMER, FERRITIN, LDH, CRP in the last 72 hours.  Lab Results  Component Value Date   Wall NEGATIVE 08/09/2020    CBC: Recent Labs  Lab 08/10/20 0227 08/11/20 0421 08/12/20 0316 08/14/20 0357  WBC 11.7* 13.0* 15.3* 22.8*  NEUTROABS  --   --   --  18.0*  HGB 8.9* 9.4* 8.9* 9.6*  HCT 28.0* 29.1* 28.2* 29.2*  MCV 93.3 93.0 93.4 93.9  PLT 191 221 220 339   Cardiac Enzymes: No results for input(s): CKTOTAL, CKMB, CKMBINDEX, TROPONINI in the last 168 hours. BNP (last 3 results) No results for input(s): PROBNP in the last 8760 hours. CBG: Recent Labs  Lab 08/15/20 0607 08/15/20 1208 08/15/20 1636 08/15/20 2132 08/16/20 0549  GLUCAP 184* 167* 176* 201* 112*   D-Dimer: No results for input(s): DDIMER in the last 72 hours. Hgb A1c: Recent Labs    08/15/20 0249  HGBA1C 8.5*   Lipid Profile: Recent Labs    08/15/20 0249  CHOL 50  HDL 13*  LDLCALC 21  TRIG 80  CHOLHDL 3.8   Thyroid function studies: No results for input(s): TSH, T4TOTAL, T3FREE, THYROIDAB in the last 72 hours.  Invalid input(s): FREET3 Anemia work up: No results for input(s): VITAMINB12, FOLATE, FERRITIN, TIBC, IRON, RETICCTPCT in the last 72 hours. Sepsis Labs: Recent Labs  Lab 08/10/20 0227  08/11/20 0421 08/12/20 0316 08/14/20 0357  WBC 11.7* 13.0* 15.3* 22.8*   Microbiology Recent Results (from the past 240 hour(s))  Resp Panel by RT-PCR (Flu A&B, Covid) Nasopharyngeal Swab     Status: None   Collection Time: 08/09/20  9:12 AM   Specimen: Nasopharyngeal Swab; Nasopharyngeal(NP) swabs in vial transport medium  Result Value Ref Range Status   SARS Coronavirus 2 by RT PCR NEGATIVE NEGATIVE Final    Comment: (NOTE) SARS-CoV-2 target nucleic acids are NOT DETECTED.  The SARS-CoV-2 RNA is generally detectable in upper respiratory specimens during the acute  phase of infection. The lowest concentration of SARS-CoV-2 viral copies this assay can detect is 138 copies/mL. A negative result does not preclude SARS-Cov-2 infection and should not be used as the sole basis for treatment or other patient management decisions. A negative result may occur with  improper specimen collection/handling, submission of specimen other than nasopharyngeal swab, presence of viral mutation(s) within the areas targeted by this assay, and inadequate number of viral copies(<138 copies/mL). A negative result must be combined with clinical observations, patient history, and epidemiological information. The expected result is Negative.  Fact Sheet for Patients:  BloggerCourse.com  Fact Sheet for Healthcare Providers:  SeriousBroker.it  This test is no t yet approved or cleared by the [place] FDA and  has been authorized for detection and/or diagnosis of SARS-CoV-2 by FDA under an Emergency Use Authorization (EUA). This EUA will remain  in effect (meaning this test can be used) for the duration of the COVID-19 declaration under Section 564(b)(1) of the Act, 21 U.S.C.section 360bbb-3(b)(1), unless the authorization is terminated  or revoked sooner.       Influenza A by PCR NEGATIVE NEGATIVE Final   Influenza B by PCR NEGATIVE NEGATIVE  Final    Comment: (NOTE) The Xpert Xpress SARS-CoV-2/FLU/RSV plus assay is intended as an aid in the diagnosis of influenza from Nasopharyngeal swab specimens and should not be used as a sole basis for treatment. Nasal washings and aspirates are unacceptable for Xpert Xpress SARS-CoV-2/FLU/RSV testing.  Fact Sheet for Patients: BloggerCourse.com  Fact Sheet for Healthcare Providers: SeriousBroker.it  This test is not yet approved or cleared by the [place] FDA and has been authorized for detection and/or diagnosis of SARS-CoV-2 by FDA under an Emergency Use Authorization (EUA). This EUA will remain in effect (meaning this test can be used) for the duration of the COVID-19 declaration under Section 564(b)(1) of the Act, 21 U.S.C. section 360bbb-3(b)(1), unless the authorization is terminated or revoked.  Performed at [facility], [address], [place], [place] [address]   Culture, Urine     Status: Abnormal   Collection Time: 08/14/20  2:15 AM   Specimen: Urine, Random  Result Value Ref Range Status   Specimen Description URINE, RANDOM  Final   Special Requests   Final    NONE Performed at [facility], [address], [place], [place] [address]    Culture >=100,000 COLONIES/mL ESCHERICHIA COLI (A)  Final   Report Status 08/16/2020 FINAL  Final   Organism ID, Bacteria ESCHERICHIA COLI (A)  Final      Susceptibility   Escherichia coli - MIC*    AMPICILLIN 8 SENSITIVE Sensitive     CEFAZOLIN <=4 SENSITIVE Sensitive     CEFEPIME <=0.12 SENSITIVE Sensitive     CEFTRIAXONE <=0.25 SENSITIVE Sensitive     CIPROFLOXACIN <=0.25 SENSITIVE Sensitive     GENTAMICIN <=1 SENSITIVE Sensitive     IMIPENEM <=0.25 SENSITIVE Sensitive     NITROFURANTOIN 32 SENSITIVE Sensitive     TRIMETH/SULFA <=20 SENSITIVE Sensitive     AMPICILLIN/SULBACTAM 4 SENSITIVE Sensitive     PIP/TAZO <=4 SENSITIVE Sensitive     * >=100,000  COLONIES/mL ESCHERICHIA COLI     Medications:   . carvedilol  12.5 mg Oral BID WC  . Chlorhexidine Gluconate Cloth  6 each Topical Daily  . docusate sodium  100 mg Oral Daily  . famotidine  20 mg Oral Daily  . feeding supplement (GLUCERNA SHAKE)  237 mL Oral TID BM  .  insulin aspart  0-5 Units Subcutaneous QHS  . insulin aspart  0-9 Units Subcutaneous TID WC  . insulin aspart  3 Units Subcutaneous TID WC  . insulin detemir  7 Units Subcutaneous BID  . levothyroxine  200 mcg Oral QAC breakfast  . melatonin  5 mg Oral QHS  . multivitamin with minerals  1 tablet Oral Daily  . pantoprazole  40 mg Oral QHS  . polyethylene glycol  17 g Oral BID  . sodium chloride flush  3 mL Intravenous Q12H  . warfarin  1 mg Oral ONCE-1600  . Warfarin - Pharmacist Dosing Inpatient   Does not apply q1600   Continuous Infusions: . sodium chloride    . cefTRIAXone (ROCEPHIN)  IV 1 g (08/16/20 0406)      LOS: 6 days   Charlynne Cousins  [facility]  08/16/2020, 9:30 AM

## 2020-08-16 NOTE — Plan of Care (Signed)
  Problem: Self-Care: Goal: Ability to communicate needs accurately will improve Outcome: Completed/Met

## 2020-08-16 NOTE — Progress Notes (Signed)
Request made to [facility] made to confirm bed for Monday. No response yet.

## 2020-08-16 NOTE — Progress Notes (Signed)
Staff message sent to our [address] staff to arrange 30 day event monitor as outpatient.   Order placed.

## 2020-08-16 NOTE — Progress Notes (Signed)
ANTICOAGULATION CONSULT NOTE  Pharmacy Consult for warfarin Indication: Hx DVT, CVA  Labs: Recent Labs    08/14/20 0357 08/15/20 0249 08/16/20 0254  HGB 9.6*  --   --   HCT 29.2*  --   --   PLT 339  --   --   LABPROT 28.4* 27.6* 29.0*  INR 2.8* 2.7* 2.9*  CREATININE 2.24* 1.99* 1.64*    Estimated Creatinine Clearance: 28.1 mL/min (A) (by C-G formula based on SCr of 1.64 mg/dL (H)).  Assessment: 84 yo female who presented with SOB. PTA the patient is on warfarin at home for hx of DVT. Of note, the patient has a history of rectus sheath hematoma while on enoxaparin and had and IVC filter placed.  INR upon admission was supratherapeutic at 3.7. The last reported dose of warfarin was on 12/23 @ 1900. Pharmacy is consulted to dose warfarin.   PTA warfarin dose: 7.5 mg PO daily   INR today is therapeutic after resuming warfarin yesterday evening (INR 2.9 << 2.7, goal of 2-3). CBC stable. Will continue with reduced dose and monitor INR trends closely.  Goal of Therapy:  INR 2-3 Monitor platelets by anticoagulation protocol: Yes   Plan:  - Warfarin 1 mg x 1 dose at 1800 today  - Daily PT/INR, CBC q72h - Will continue to monitor for any signs/symptoms of bleeding and will follow up with PT/INR in the a.m.    Thank you for allowing pharmacy to be a part of this patient's care.  Georgina Pillion, PharmD, BCPS Clinical Pharmacist Clinical phone for 08/16/2020: [phone] 08/16/2020 8:27 AM   **Pharmacist phone directory can now be found on [email or web address] ([email or web address]).  Listed under [facility].

## 2020-08-17 DIAGNOSIS — N390 Urinary tract infection, site not specified: Secondary | ICD-10-CM | POA: Diagnosis not present

## 2020-08-17 DIAGNOSIS — R791 Abnormal coagulation profile: Secondary | ICD-10-CM | POA: Diagnosis not present

## 2020-08-17 DIAGNOSIS — R0989 Other specified symptoms and signs involving the circulatory and respiratory systems: Secondary | ICD-10-CM | POA: Diagnosis not present

## 2020-08-17 LAB — BASIC METABOLIC PANEL
Anion gap: 10 (ref 5–15)
BUN: 36 mg/dL — ABNORMAL HIGH (ref 8–23)
CO2: 23 mmol/L (ref 22–32)
Calcium: 7.4 mg/dL — ABNORMAL LOW (ref 8.9–10.3)
Chloride: 109 mmol/L (ref 98–111)
Creatinine, Ser: 1.46 mg/dL — ABNORMAL HIGH (ref 0.44–1.00)
GFR, Estimated: 36 mL/min — ABNORMAL LOW (ref >60)
Glucose, Bld: 162 mg/dL — ABNORMAL HIGH (ref 70–99)
Potassium: 4.1 mmol/L (ref 3.5–5.1)
Sodium: 142 mmol/L (ref 135–145)

## 2020-08-17 LAB — CBC
HCT: 30.8 % — ABNORMAL LOW (ref 36.0–46.0)
Hemoglobin: 9.4 g/dL — ABNORMAL LOW (ref 12.0–15.0)
MCH: 29.7 pg (ref 26.0–34.0)
MCHC: 30.5 g/dL (ref 30.0–36.0)
MCV: 97.2 fL (ref 80.0–100.0)
Platelets: 485 10*3/uL — ABNORMAL HIGH (ref 150–400)
RBC: 3.17 MIL/uL — ABNORMAL LOW (ref 3.87–5.11)
RDW: 18.5 % — ABNORMAL HIGH (ref 11.5–15.5)
WBC: 13.9 10*3/uL — ABNORMAL HIGH (ref 4.0–10.5)
nRBC: 0.1 % (ref 0.0–0.2)

## 2020-08-17 LAB — GLUCOSE, CAPILLARY
Glucose-Capillary: 174 mg/dL — ABNORMAL HIGH (ref 70–99)
Glucose-Capillary: 202 mg/dL — ABNORMAL HIGH (ref 70–99)
Glucose-Capillary: 116 mg/dL — ABNORMAL HIGH (ref 70–99)
Glucose-Capillary: 116 mg/dL — ABNORMAL HIGH (ref 70–99)

## 2020-08-17 LAB — PROTIME-INR
INR: 3.1 — ABNORMAL HIGH (ref 0.8–1.2)
Prothrombin Time: 31.2 seconds — ABNORMAL HIGH (ref 11.4–15.2)

## 2020-08-17 MED ORDER — WARFARIN SODIUM 1 MG PO TABS
1.0000 mg | ORAL_TABLET | Freq: Once | ORAL | Status: AC
  Administered 2020-08-17: 1 mg via ORAL
  Filled 2020-08-17: qty 1

## 2020-08-17 MED ORDER — CEFTRIAXONE SODIUM 1 G IJ SOLR
1.0000 g | INTRAMUSCULAR | Status: AC
  Administered 2020-08-17: 1 g via INTRAVENOUS
  Filled 2020-08-17: qty 10

## 2020-08-17 NOTE — Progress Notes (Signed)
[facility] PROGRESS NOTE    Progress Note  Kaylee Mullins  [id number] DOB: 10/07/1935 DOA: 08/09/2020 PCP: Nolene Ebbs, MD     Brief Narrative:   Kaylee Mullins is an 85 y.o. 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  Cardiology thought the patient was not fluid overloaded they agree with holding Lasix. Her creatinine is improving nicely.  Acute CVA: MRI of the brain showed Numerous scattered small acute infarcts in both cerebral hemispheres, primarily affecting white matter of the MCA territories. MRA head negative. Neurology was consulted recommended to continue Coumadin. A1c of 8.5, HDL less than 40 LDL 21 PT, OT, Speech consult pending Continue statins. Physical therapy evaluated the patient recommended skilled nursing facility Neurology recommended a 30-day monitor as an outpatient to rule out A. fib. Awaiting skilled nursing facility placement.  Chronic diastolic heart failure: Appears to be euvolemic renal function worsened due to IV diuresis.  Continue to hold Lasix Creatinine improving.  Acute kidney injury: Baseline creatinine of around 1. Likely due to overdiuresis continue to hold Lasix creatinine is improving.  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  Abdominal ultrasound showed multiple liver metastases. We will wait for renal function to improve to try to perform a CT scan of the abdomen pelvis with contrast.  Diabetes mellitus type 2: Continue long-acting insulin plus sliding scale.  History of DVT: On Coumadin INR  therapeutic.  Elevated troponins: Likely due to demand ischemia.  Hyperkalemia: She was given Kayexalate and IV fluids were potassium has improved.  Leukocytosis possibly due to urinary tract infection She started empirically on IV Rocephin urine culture grew E. coli more than 100,000 colonies sensitive to Rocephin.  She will complete her treatment in house.   DVT prophylaxis: coumadin Family Communication:none Status is: Inpatient  Remains inpatient appropriate because:Hemodynamically unstable   Dispo: The patient is from: Home              Anticipated d/c is to: SNF              Anticipated d/c date is1 days              Patient currently is not medically stable to d/c.  Awaiting skilled nursing facility placement on 08/19/2020        Code Status:     Code Status Orders  (From admission, onward)         Start     Ordered   08/09/20 1940  Full code  Continuous        08/09/20 1939        Code Status History    Date Active Date Inactive Code Status Order ID Comments User Context   04/12/2019 1728 04/14/2019 2053 Full Code [id number]  Mercy Riding, MD ED   07/30/2017 1847 08/06/2017 1909 Full Code [id number]  Charlie Pitter, PA-C ED   02/06/2017 0324 03/29/2017 2242 Full Code [id number]  Greer Pickerel, MD Inpatient   01/12/2017 1521 02/05/2017 1855 Full Code [id number]  Stark Klein, MD Inpatient   07/15/2016 1524 07/16/2016 2003 Full Code [id number]  Aletta Edouard, MD Inpatient   02/10/2016 2046  02/15/2016 0002 Full Code [id number]  Kristeen Miss, MD Inpatient   09/16/2014 1707 09/18/2014 1713 Full Code [id number]  Jonetta Osgood, MD Inpatient   09/11/2014 1646 09/13/2014 2219 Full Code [id number]  Samella Parr, NP Inpatient   Advance Care Planning Activity    Advance Directive Documentation   Flowsheet Row Most Recent Value  Type of Advance Directive Healthcare Power of Attorney  Pre-existing out of facility DNR order (yellow form or pink MOST form) --  "MOST" Form in  Place? --        IV Access:    Peripheral IV   Procedures and diagnostic studies:   MR ANGIO HEAD WO CONTRAST  Result Date: 08/15/2020 CLINICAL DATA:  Acute infarcts on MRI brain EXAM: MRA HEAD WITHOUT CONTRAST TECHNIQUE: Angiographic images of the Circle of Willis were obtained using MRA technique without intravenous contrast. COMPARISON:  None. FINDINGS: Intracranial internal carotid arteries are patent. Middle and anterior cerebral arteries are patent. Intracranial vertebral arteries, basilar artery, posterior cerebral arteries are patent. Posterior communicating artery is identified on the left. There is no significant stenosis or aneurysm. IMPRESSION: Normal MRA of the head. Electronically Signed   By: Macy Mis M.D.   On: 08/15/2020 14:53   VAS US CAROTID  Result Date: 08/15/2020 Carotid Arterial Duplex Study Indications:       CVA. Risk Factors:      Hypertension, hyperlipidemia, Diabetes, no history of                    smoking. Comparison Study:  Prev 09/2016 Performing Technologist: Vonzell Schlatter RVT  Examination Guidelines: A complete evaluation includes B-mode imaging, spectral Doppler, color Doppler, and power Doppler as needed of all accessible portions of each vessel. Bilateral testing is considered an integral part of a complete examination. Limited examinations for reoccurring indications may be performed as noted.  Right Carotid Findings: +----------+--------+--------+--------+------------------+--------+           PSV cm/sEDV cm/sStenosisPlaque DescriptionComments +----------+--------+--------+--------+------------------+--------+ CCA Prox  58      11                                         +----------+--------+--------+--------+------------------+--------+ CCA Distal56      12                                         +----------+--------+--------+--------+------------------+--------+ ICA Prox  59      14      1-39%   heterogenous                +----------+--------+--------+--------+------------------+--------+ ICA Distal68      15                                         +----------+--------+--------+--------+------------------+--------+ ECA       63                                                 +----------+--------+--------+--------+------------------+--------+ +----------+--------+-------+--------+-------------------+           PSV cm/sEDV cmsDescribeArm Pressure (mmHG) +----------+--------+-------+--------+-------------------+ [id number]                                        +----------+--------+-------+--------+-------------------+ +---------+--------+--+--------+--+  VertebralPSV cm/s69EDV cm/s14 +---------+--------+--+--------+--+  Left Carotid Findings: +----------+--------+--------+--------+------------------+--------+           PSV cm/sEDV cm/sStenosisPlaque DescriptionComments +----------+--------+--------+--------+------------------+--------+ CCA Prox  82      20                                         +----------+--------+--------+--------+------------------+--------+ CCA Distal67      13                                         +----------+--------+--------+--------+------------------+--------+ ICA Prox  65      18      1-39%   heterogenous               +----------+--------+--------+--------+------------------+--------+ ICA Distal48      10                                tortuous +----------+--------+--------+--------+------------------+--------+ ECA       54                                                 +----------+--------+--------+--------+------------------+--------+ +----------+--------+--------+--------+-------------------+           PSV cm/sEDV cm/sDescribeArm Pressure (mmHG) +----------+--------+--------+--------+-------------------+ [id number]                                          +----------+--------+--------+--------+-------------------+ +---------+--------+--+--------+-+ VertebralPSV cm/s44EDV cm/s8 +---------+--------+--+--------+-+   Summary: Right Carotid: Velocities in the right ICA are consistent with a 1-39% stenosis. Left Carotid: Velocities in the left ICA are consistent with a 1-39% stenosis. Vertebrals:  Bilateral vertebral arteries demonstrate antegrade flow. Subclavians: Normal flow hemodynamics were seen in bilateral subclavian              arteries. *See table(s) above for measurements and observations.  Electronically signed by Antony Contras MD on 08/15/2020 at 5:49:54 PM.    Final      Medical Consultants:    None.  Anti-Infectives:   none  Subjective:    [name] no new complaints.  Objective:    Vitals:   08/16/20 1631 08/16/20 1920 08/17/20 0041 08/17/20 0356  BP: (!) 151/57 (!) 119/49 (!) 137/59 (!) 136/52  Pulse: (!) 56 (!) 55 (!) 57 (!) 55  Resp: 20 18 18 18   Temp: 97.8 F (36.6 C) (!) 97.4 F (36.3 C) 98.3 F (36.8 C) 98 F (36.7 C)  TempSrc: Oral Oral Oral Oral  SpO2: 100% 100% 100% 100%  Weight:    85.6 kg  Height:       SpO2: 100 % O2 Flow Rate (L/min): 3 L/min FiO2 (%): 100 %   Intake/Output Summary (Last 24 hours) at 08/17/2020 0931 Last data filed at 08/17/2020 0400 Gross per 24 hour  Intake 787.51 ml  Output 300 ml  Net 487.51 ml   Filed Weights   08/15/20 0000 08/16/20 0400 08/17/20 0356  Weight: 83.2 kg 86.1 kg 85.6 kg    Exam: General exam: In no acute distress. Respiratory system: Good air movement and  clear to auscultation. Cardiovascular system: S1 & S2 heard, RRR. No JVD. Gastrointestinal system: Abdomen is nondistended, soft and nontender.  Extremities: No pedal edema. Skin: No rashes, lesions or ulcers Psychiatry: Judgement and insight appear normal. Mood & affect appropriate.  Data Reviewed:    Labs: Basic Metabolic Panel: Recent Labs  Lab 08/12/20 0316 08/13/20 0330  08/13/20 1611 08/14/20 0357 08/15/20 0249 08/16/20 0254 08/17/20 0511  NA 138   < > 138 144 139 140 142  K 3.3*   < > 4.3 4.3 4.1 3.8 4.1  CL 100   < > 102 107 104 107 109  CO2 28   < > 26 26 25 27 23   GLUCOSE 116*   < > 132* 96 202* 131* 162*  BUN 38*   < > 46* 48* 49* 40* 36*  CREATININE 2.21*   < > 2.22* 2.24* 1.99* 1.64* 1.46*  CALCIUM 7.6*   < > 7.3* 7.5* 7.2* 7.3* 7.4*  MG 2.1  --   --   --   --   --   --    < > = values in this interval not displayed.   GFR Estimated Creatinine Clearance: 31.5 mL/min (A) (by C-G formula based on SCr of 1.46 mg/dL (H)). Liver Function Tests: Recent Labs  Lab 08/11/20 0421 08/12/20 0316 08/13/20 0330  AST 199* 159* 102*  ALT 57* 60* 50*  ALKPHOS 176* 166* 185*  BILITOT 0.8 0.5 1.5*  PROT 6.1* 5.8* 6.0*  ALBUMIN 1.6* 1.6* 1.7*   No results for input(s): LIPASE, AMYLASE in the last 168 hours. Recent Labs  Lab 08/13/20 2014  AMMONIA 27   Coagulation profile Recent Labs  Lab 08/13/20 0330 08/14/20 0357 08/15/20 0249 08/16/20 0254 08/17/20 0511  INR 2.8* 2.8* 2.7* 2.9* 3.1*   COVID-19 Labs  No results for input(s): DDIMER, FERRITIN, LDH, CRP in the last 72 hours.  Lab Results  Component Value Date   SARSCOV2NAA NEGATIVE 08/09/2020    CBC: Recent Labs  Lab 08/11/20 0421 08/12/20 0316 08/14/20 0357 08/17/20 0511  WBC 13.0* 15.3* 22.8* 13.9*  NEUTROABS  --   --  18.0*  --   HGB 9.4* 8.9* 9.6* 9.4*  HCT 29.1* 28.2* 29.2* 30.8*  MCV 93.0 93.4 93.9 97.2  PLT 221 220 339 485*   Cardiac Enzymes: No results for input(s): CKTOTAL, CKMB, CKMBINDEX, TROPONINI in the last 168 hours. BNP (last 3 results) No results for input(s): PROBNP in the last 8760 hours. CBG: Recent Labs  Lab 08/16/20 0549 08/16/20 1212 08/16/20 1628 08/16/20 2123 08/17/20 0612  GLUCAP 112* 110* 169* 168* 174*   D-Dimer: No results for input(s): DDIMER in the last 72 hours. Hgb A1c: Recent Labs    08/15/20 0249  HGBA1C 8.5*   Lipid  Profile: Recent Labs    08/15/20 0249  CHOL 50  HDL 13*  LDLCALC 21  TRIG 80  CHOLHDL 3.8   Thyroid function studies: No results for input(s): TSH, T4TOTAL, T3FREE, THYROIDAB in the last 72 hours.  Invalid input(s): FREET3 Anemia work up: No results for input(s): VITAMINB12, FOLATE, FERRITIN, TIBC, IRON, RETICCTPCT in the last 72 hours. Sepsis Labs: Recent Labs  Lab 08/11/20 0421 08/12/20 0316 08/14/20 0357 08/17/20 0511  WBC 13.0* 15.3* 22.8* 13.9*   Microbiology Recent Results (from the past 240 hour(s))  Resp Panel by RT-PCR (Flu A&B, Covid) Nasopharyngeal Swab     Status: None   Collection Time: 08/09/20  9:12 AM   Specimen: Nasopharyngeal Swab; Nasopharyngeal(NP)  swabs in vial transport medium  Result Value Ref Range Status   SARS Coronavirus 2 by RT PCR NEGATIVE NEGATIVE Final    Comment: (NOTE) SARS-CoV-2 target nucleic acids are NOT DETECTED.  The SARS-CoV-2 RNA is generally detectable in upper respiratory specimens during the acute phase of infection. The lowest concentration of SARS-CoV-2 viral copies this assay can detect is 138 copies/mL. A negative result does not preclude SARS-Cov-2 infection and should not be used as the sole basis for treatment or other patient management decisions. A negative result may occur with  improper specimen collection/handling, submission of specimen other than nasopharyngeal swab, presence of viral mutation(s) within the areas targeted by this assay, and inadequate number of viral copies(<138 copies/mL). A negative result must be combined with clinical observations, patient history, and epidemiological information. The expected result is Negative.  Fact Sheet for Patients:  EntrepreneurPulse.com.au  Fact Sheet for Healthcare Providers:  IncredibleEmployment.be  This test is no t yet approved or cleared by the [place] FDA and  has been authorized for detection and/or diagnosis of  SARS-CoV-2 by FDA under an Emergency Use Authorization (EUA). This EUA will remain  in effect (meaning this test can be used) for the duration of the COVID-19 declaration under Section 564(b)(1) of the Act, 21 U.S.C.section 360bbb-3(b)(1), unless the authorization is terminated  or revoked sooner.       Influenza A by PCR NEGATIVE NEGATIVE Final   Influenza B by PCR NEGATIVE NEGATIVE Final    Comment: (NOTE) The Xpert Xpress SARS-CoV-2/FLU/RSV plus assay is intended as an aid in the diagnosis of influenza from Nasopharyngeal swab specimens and should not be used as a sole basis for treatment. Nasal washings and aspirates are unacceptable for Xpert Xpress SARS-CoV-2/FLU/RSV testing.  Fact Sheet for Patients: EntrepreneurPulse.com.au  Fact Sheet for Healthcare Providers: IncredibleEmployment.be  This test is not yet approved or cleared by the [place] FDA and has been authorized for detection and/or diagnosis of SARS-CoV-2 by FDA under an Emergency Use Authorization (EUA). This EUA will remain in effect (meaning this test can be used) for the duration of the COVID-19 declaration under Section 564(b)(1) of the Act, 21 U.S.C. section 360bbb-3(b)(1), unless the authorization is terminated or revoked.  Performed at [facility], [place] [address], [place], [place] [address]   Culture, Urine     Status: Abnormal   Collection Time: 08/14/20  2:15 AM   Specimen: Urine, Random  Result Value Ref Range Status   Specimen Description URINE, RANDOM  Final   Special Requests   Final    NONE Performed at [facility], [place] [address], [place], [place] [address]    Culture >=100,000 COLONIES/mL ESCHERICHIA COLI (A)  Final   Report Status 08/16/2020 FINAL  Final   Organism ID, Bacteria ESCHERICHIA COLI (A)  Final      Susceptibility   Escherichia coli - MIC*    AMPICILLIN 8 SENSITIVE Sensitive     CEFAZOLIN <=4 SENSITIVE Sensitive      CEFEPIME <=0.12 SENSITIVE Sensitive     CEFTRIAXONE <=0.25 SENSITIVE Sensitive     CIPROFLOXACIN <=0.25 SENSITIVE Sensitive     GENTAMICIN <=1 SENSITIVE Sensitive     IMIPENEM <=0.25 SENSITIVE Sensitive     NITROFURANTOIN 32 SENSITIVE Sensitive     TRIMETH/SULFA <=20 SENSITIVE Sensitive     AMPICILLIN/SULBACTAM 4 SENSITIVE Sensitive     PIP/TAZO <=4 SENSITIVE Sensitive     * >=100,000 COLONIES/mL ESCHERICHIA COLI     Medications:   .  carvedilol  12.5 mg Oral BID WC  . Chlorhexidine Gluconate Cloth  6 each Topical Daily  . docusate sodium  100 mg Oral Daily  . famotidine  20 mg Oral Daily  . feeding supplement (GLUCERNA SHAKE)  237 mL Oral TID BM  . insulin aspart  0-5 Units Subcutaneous QHS  . insulin aspart  0-9 Units Subcutaneous TID WC  . insulin aspart  3 Units Subcutaneous TID WC  . insulin detemir  7 Units Subcutaneous BID  . levothyroxine  200 mcg Oral QAC breakfast  . melatonin  5 mg Oral QHS  . multivitamin with minerals  1 tablet Oral Daily  . pantoprazole  40 mg Oral QHS  . polyethylene glycol  17 g Oral BID  . sodium chloride flush  3 mL Intravenous Q12H  . warfarin  1 mg Oral ONCE-1600  . Warfarin - Pharmacist Dosing Inpatient   Does not apply q1600   Continuous Infusions: . sodium chloride    . cefTRIAXone (ROCEPHIN)  IV 1 g (08/17/20 0349)      LOS: 7 days   Charlynne Cousins  [facility]  08/17/2020, 9:31 AM

## 2020-08-17 NOTE — Progress Notes (Signed)
ANTICOAGULATION CONSULT NOTE  Pharmacy Consult for warfarin Indication: Hx DVT, CVA  Labs: Recent Labs    08/15/20 0249 08/16/20 0254 08/17/20 0511  HGB  --   --  9.4*  HCT  --   --  30.8*  PLT  --   --  485*  LABPROT 27.6* 29.0* 31.2*  INR 2.7* 2.9* 3.1*  CREATININE 1.99* 1.64* 1.46*    Estimated Creatinine Clearance: 31.5 mL/min (A) (by C-G formula based on SCr of 1.46 mg/dL (H)).  Assessment: 85 yo female who presented with SOB. PTA the patient is on warfarin at home for hx of DVT. Of note, the patient has a history of rectus sheath hematoma while on enoxaparin and had and IVC filter placed.  INR upon admission was supratherapeutic at 3.7. The last reported dose of warfarin was on 12/23 @ 1900. Pharmacy is consulted to dose warfarin.   PTA warfarin dose: 7.5 mg PO daily   INR today is slightly supratherapeutic at 3.1 most likely reflecting doses prior to dose reduction yesterday. CBC stable. Patient is consistently eating 50% of meal. Will continue with reduced dose and monitor INR trends closely.  Goal of Therapy:  INR 2-3 Monitor platelets by anticoagulation protocol: Yes   Plan:  - Warfarin 1 mg x 1  - Daily PT/INR, CBC q72h - Will continue to monitor for any signs/symptoms of bleeding and will follow up with PT/INR in the a.m.    Thank you for allowing pharmacy to be a part of this patient's care.  Kinnie Feil, PharmD PGY1 Acute Care Pharmacy Resident Phone: [phone] 08/17/2020 9:04 AM  Please check [email or web address] for unit specific pharmacy phone numbers.

## 2020-08-18 DIAGNOSIS — I5033 Acute on chronic diastolic (congestive) heart failure: Secondary | ICD-10-CM | POA: Diagnosis not present

## 2020-08-18 LAB — GLUCOSE, CAPILLARY
Glucose-Capillary: 130 mg/dL — ABNORMAL HIGH (ref 70–99)
Glucose-Capillary: 160 mg/dL — ABNORMAL HIGH (ref 70–99)
Glucose-Capillary: 201 mg/dL — ABNORMAL HIGH (ref 70–99)
Glucose-Capillary: 136 mg/dL — ABNORMAL HIGH (ref 70–99)

## 2020-08-18 LAB — PROTIME-INR
INR: 3.5 — ABNORMAL HIGH (ref 0.8–1.2)
Prothrombin Time: 34.2 seconds — ABNORMAL HIGH (ref 11.4–15.2)

## 2020-08-18 NOTE — TOC Progression Note (Signed)
Transition of Care [facility]) - Progression Note    Patient Details  Name: VISTA SAWATZKY MRN: [id number] Date of Birth: 10-14-1935  [facility]) CM/SW Contact  Eduard Roux, [place] Phone Number: 08/18/2020, 11:23 AM  Clinical Narrative:     Per previous CSW note- patient SNF Auth approved [id number] [id number] 12/28 - 12/30. Need to add facility.  Patient will need re-authorization for SNF and confirmed bed offer before discharged to SNF.   TOC will continue to follow and assist with discharge plan.  Antony Blackbird, MSW, LCSW Clinical Social Worker   Expected Discharge Plan: Skilled Nursing Facility Barriers to Discharge: Continued Medical Work up,SNF Pending bed offer  Expected Discharge Plan and Services Expected Discharge Plan: Skilled Nursing Facility       Living arrangements for the past 2 months: Single Family Home                                       Social Determinants of Health (SDOH) Interventions    Readmission Risk Interventions No flowsheet data found.

## 2020-08-18 NOTE — Progress Notes (Signed)
[facility] PROGRESS NOTE    Progress Note  Kaylee Mullins  [id number] DOB: Apr 02, 1936 DOA: 08/09/2020 PCP: Nolene Ebbs, MD     Brief Narrative:   Kaylee Mullins is an 85 y.o. 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  Cardiology thought the patient was not fluid overloaded they agree with holding Lasix. Her creatinine is improving nicely.  Acute CVA: MRI of the brain showed Numerous scattered small acute infarcts in both cerebral hemispheres, primarily affecting white matter of the MCA territories. MRA head negative. Neurology was consulted recommended to continue Coumadin. A1c of 8.5, HDL less than 40 LDL 21 PT, OT, Speech consult pending Continue statins. Physical therapy evaluated the patient recommended skilled nursing facility Neurology recommended a 30-day monitor as an outpatient to rule out A. fib. Patient is awaiting skilled nursing facility placement of [facility].  Chronic diastolic heart failure: Appears to be euvolemic renal function worsened due to IV diuresis.  Continue to hold Lasix Creatinine improving.  Acute kidney injury: Baseline creatinine of around 1. Likely due to overdiuresis continue to hold Lasix creatinine is improving.  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  Abdominal ultrasound showed multiple liver metastases. We will wait for renal function to improve to try to perform a CT scan of the abdomen pelvis with contrast.  Diabetes mellitus type 2: Continue long-acting insulin plus sliding scale.  History of  DVT: On Coumadin INR supra-therapeutic, notify pharmacy that the INR is elevated.  Elevated troponins: Likely due to demand ischemia.  Hyperkalemia: She was given Kayexalate and IV fluids were potassium has improved.  Leukocytosis possibly due to urinary tract infection She started empirically on IV Rocephin urine culture grew E. coli more than 100,000 colonies sensitive to Rocephin.  She will complete her treatment in house.   DVT prophylaxis: coumadin Family Communication:none Status is: Inpatient  Remains inpatient appropriate because:Hemodynamically unstable   Dispo: The patient is from: Home              Anticipated d/c is to: SNF              Anticipated d/c date is1 days              Patient currently is not medically stable to d/c.  Awaiting skilled nursing facility placement on 08/19/2020        Code Status:     Code Status Orders  (From admission, onward)         Start     Ordered   08/09/20 1940  Full code  Continuous        08/09/20 1939        Code Status History    Date Active Date Inactive Code Status Order ID Comments User Context   04/12/2019 1728 04/14/2019 2053 Full Code [id number]  Mercy Riding, MD ED   07/30/2017 1847 08/06/2017 1909 Full Code [id number]  Charlie Pitter, PA-C ED   02/06/2017 0324 03/29/2017 2242 Full Code [id number]  Greer Pickerel, MD Inpatient   01/12/2017 1521 02/05/2017 1855 Full Code [id number]  Stark Klein, MD Inpatient   07/15/2016 1524 07/16/2016 2003 Full  Code [id number]  Irish Lack, MD Inpatient   02/10/2016 2046 02/15/2016 0002 Full Code [id number]  Barnett Abu, MD Inpatient   09/16/2014 1707 09/18/2014 1713 Full Code [id number]  Maretta Bees, MD Inpatient   09/11/2014 1646 09/13/2014 2219 Full Code [id number]  Russella Dar, NP Inpatient   Advance Care Planning Activity    Advance Directive Documentation   Flowsheet Row Most Recent Value  Type of Advance Directive Healthcare Power of Attorney  Pre-existing out of  facility DNR order (yellow form or pink MOST form) -  "MOST" Form in Place? -        IV Access:    Peripheral IV   Procedures and diagnostic studies:   No results found.   Medical Consultants:    None.  Anti-Infectives:   none  Subjective:    [name] no new complaints.  Objective:    Vitals:   08/18/20 0025 08/18/20 0335 08/18/20 0400 08/18/20 0823  BP: (!) 115/51 (!) 102/54  (!) 122/55  Pulse: (!) 58 (!) 56  61  Resp: 18 18    Temp: 98 F (36.7 C) 98.3 F (36.8 C)    TempSrc: Oral Oral    SpO2: 99% 99%    Weight:   84.2 kg   Height:       SpO2: 99 % O2 Flow Rate (L/min): 3 L/min FiO2 (%): 100 %   Intake/Output Summary (Last 24 hours) at 08/18/2020 1048 Last data filed at 08/18/2020 0835 Gross per 24 hour  Intake 420 ml  Output 500 ml  Net -80 ml   Filed Weights   08/16/20 0400 08/17/20 0356 08/18/20 0400  Weight: 86.1 kg 85.6 kg 84.2 kg    Exam: General exam: In no acute distress. Respiratory system: Good air movement and clear to auscultation. Cardiovascular system: S1 & S2 heard, RRR. No JVD. Gastrointestinal system: Abdomen is nondistended, soft and nontender.  Extremities: No pedal edema. Skin: No rashes, lesions or ulcers Psychiatry: Judgement and insight appear normal. Mood & affect appropriate.  Data Reviewed:    Labs: Basic Metabolic Panel: Recent Labs  Lab 08/12/20 0316 08/13/20 0330 08/13/20 1611 08/14/20 0357 08/15/20 0249 08/16/20 0254 08/17/20 0511  NA 138   < > 138 144 139 140 142  K 3.3*   < > 4.3 4.3 4.1 3.8 4.1  CL 100   < > 102 107 104 107 109  CO2 28   < > 26 26 25 27 23   GLUCOSE 116*   < > 132* 96 202* 131* 162*  BUN 38*   < > 46* 48* 49* 40* 36*  CREATININE 2.21*   < > 2.22* 2.24* 1.99* 1.64* 1.46*  CALCIUM 7.6*   < > 7.3* 7.5* 7.2* 7.3* 7.4*  MG 2.1  --   --   --   --   --   --    < > = values in this interval not displayed.   GFR Estimated Creatinine Clearance: 31.2 mL/min (A) (by C-G  formula based on SCr of 1.46 mg/dL (H)). Liver Function Tests: Recent Labs  Lab 08/12/20 0316 08/13/20 0330  AST 159* 102*  ALT 60* 50*  ALKPHOS 166* 185*  BILITOT 0.5 1.5*  PROT 5.8* 6.0*  ALBUMIN 1.6* 1.7*   No results for input(s): LIPASE, AMYLASE in the last 168 hours. Recent Labs  Lab 08/13/20 2014  AMMONIA 27   Coagulation profile Recent Labs  Lab 08/14/20 0357 08/15/20 0249 08/16/20 0254 08/17/20 0511 08/18/20 10/16/20  INR 2.8* 2.7* 2.9* 3.1* 3.5*   COVID-19 Labs  No results for input(s): DDIMER, FERRITIN, LDH, CRP in the last 72 hours.  Lab Results  Component Value Date   [place] NEGATIVE 08/09/2020    CBC: Recent Labs  Lab 08/12/20 0316 08/14/20 0357 08/17/20 0511  WBC 15.3* 22.8* 13.9*  NEUTROABS  --  18.0*  --   HGB 8.9* 9.6* 9.4*  HCT 28.2* 29.2* 30.8*  MCV 93.4 93.9 97.2  PLT 220 339 485*   Cardiac Enzymes: No results for input(s): CKTOTAL, CKMB, CKMBINDEX, TROPONINI in the last 168 hours. BNP (last 3 results) No results for input(s): PROBNP in the last 8760 hours. CBG: Recent Labs  Lab 08/17/20 0612 08/17/20 1122 08/17/20 1640 08/17/20 2112 08/18/20 0609  GLUCAP 174* 202* 116* 116* 130*   D-Dimer: No results for input(s): DDIMER in the last 72 hours. Hgb A1c: No results for input(s): HGBA1C in the last 72 hours. Lipid Profile: No results for input(s): CHOL, HDL, LDLCALC, TRIG, CHOLHDL, LDLDIRECT in the last 72 hours. Thyroid function studies: No results for input(s): TSH, T4TOTAL, T3FREE, THYROIDAB in the last 72 hours.  Invalid input(s): FREET3 Anemia work up: No results for input(s): VITAMINB12, FOLATE, FERRITIN, TIBC, IRON, RETICCTPCT in the last 72 hours. Sepsis Labs: Recent Labs  Lab 08/12/20 0316 08/14/20 0357 08/17/20 0511  WBC 15.3* 22.8* 13.9*   Microbiology Recent Results (from the past 240 hour(s))  Resp Panel by RT-PCR (Flu A&B, Covid) Nasopharyngeal Swab     Status: None   Collection Time: 08/09/20   9:12 AM   Specimen: Nasopharyngeal Swab; Nasopharyngeal(NP) swabs in vial transport medium  Result Value Ref Range Status   SARS Coronavirus 2 by RT PCR NEGATIVE NEGATIVE Final    Comment: (NOTE) SARS-CoV-2 target nucleic acids are NOT DETECTED.  The SARS-CoV-2 RNA is generally detectable in upper respiratory specimens during the acute phase of infection. The lowest concentration of SARS-CoV-2 viral copies this assay can detect is 138 copies/mL. A negative result does not preclude SARS-Cov-2 infection and should not be used as the sole basis for treatment or other patient management decisions. A negative result may occur with  improper specimen collection/handling, submission of specimen other than nasopharyngeal swab, presence of viral mutation(s) within the areas targeted by this assay, and inadequate number of viral copies(<138 copies/mL). A negative result must be combined with clinical observations, patient history, and epidemiological information. The expected result is Negative.  Fact Sheet for Patients:  EntrepreneurPulse.com.au  Fact Sheet for Healthcare Providers:  IncredibleEmployment.be  This test is no t yet approved or cleared by the [place] FDA and  has been authorized for detection and/or diagnosis of SARS-CoV-2 by FDA under an Emergency Use Authorization (EUA). This EUA will remain  in effect (meaning this test can be used) for the duration of the COVID-19 declaration under Section 564(b)(1) of the Act, 21 U.S.C.section 360bbb-3(b)(1), unless the authorization is terminated  or revoked sooner.       Influenza A by PCR NEGATIVE NEGATIVE Final   Influenza B by PCR NEGATIVE NEGATIVE Final    Comment: (NOTE) The Xpert Xpress SARS-CoV-2/FLU/RSV plus assay is intended as an aid in the diagnosis of influenza from Nasopharyngeal swab specimens and should not be used as a sole basis for treatment. Nasal washings and aspirates  are unacceptable for Xpert Xpress SARS-CoV-2/FLU/RSV testing.  Fact Sheet for Patients: EntrepreneurPulse.com.au  Fact Sheet for Healthcare Providers: IncredibleEmployment.be  This test is not yet approved or cleared by the [place]  States FDA and has been authorized for detection and/or diagnosis of SARS-CoV-2 by FDA under an Emergency Use Authorization (EUA). This EUA will remain in effect (meaning this test can be used) for the duration of the COVID-19 declaration under Section 564(b)(1) of the Act, 21 U.S.C. section 360bbb-3(b)(1), unless the authorization is terminated or revoked.  Performed at [facility], [place] [address], [place], [place] [address]   Culture, Urine     Status: Abnormal   Collection Time: 08/14/20  2:15 AM   Specimen: Urine, Random  Result Value Ref Range Status   Specimen Description URINE, RANDOM  Final   Special Requests   Final    NONE Performed at [facility], [place] [address] [place], [place] [address]    Culture >=100,000 COLONIES/mL ESCHERICHIA COLI (A)  Final   Report Status 08/16/2020 FINAL  Final   Organism ID, Bacteria ESCHERICHIA COLI (A)  Final      Susceptibility   Escherichia coli - MIC*    AMPICILLIN 8 SENSITIVE Sensitive     CEFAZOLIN <=4 SENSITIVE Sensitive     CEFEPIME <=0.12 SENSITIVE Sensitive     CEFTRIAXONE <=0.25 SENSITIVE Sensitive     CIPROFLOXACIN <=0.25 SENSITIVE Sensitive     GENTAMICIN <=1 SENSITIVE Sensitive     IMIPENEM <=0.25 SENSITIVE Sensitive     NITROFURANTOIN 32 SENSITIVE Sensitive     TRIMETH/SULFA <=20 SENSITIVE Sensitive     AMPICILLIN/SULBACTAM 4 SENSITIVE Sensitive     PIP/TAZO <=4 SENSITIVE Sensitive     * >=100,000 COLONIES/mL ESCHERICHIA COLI     Medications:   . carvedilol  12.5 mg Oral BID WC  . Chlorhexidine Gluconate Cloth  6 each Topical Daily  . docusate sodium  100 mg Oral Daily  . famotidine  20 mg Oral Daily  . feeding supplement (GLUCERNA  SHAKE)  237 mL Oral TID BM  . insulin aspart  0-5 Units Subcutaneous QHS  . insulin aspart  0-9 Units Subcutaneous TID WC  . insulin aspart  3 Units Subcutaneous TID WC  . insulin detemir  7 Units Subcutaneous BID  . levothyroxine  200 mcg Oral QAC breakfast  . melatonin  5 mg Oral QHS  . multivitamin with minerals  1 tablet Oral Daily  . pantoprazole  40 mg Oral QHS  . polyethylene glycol  17 g Oral BID  . sodium chloride flush  3 mL Intravenous Q12H  . Warfarin - Pharmacist Dosing Inpatient   Does not apply q1600   Continuous Infusions: . sodium chloride        LOS: 8 days   Charlynne Cousins  [facility]  08/18/2020, 10:48 AM

## 2020-08-18 NOTE — Progress Notes (Signed)
ANTICOAGULATION CONSULT NOTE  Pharmacy Consult for warfarin Indication: Hx DVT, CVA  Labs: Recent Labs    08/16/20 0254 08/17/20 0511 08/18/20 0307  HGB  --  9.4*  --   HCT  --  30.8*  --   PLT  --  485*  --   LABPROT 29.0* 31.2* 34.2*  INR 2.9* 3.1* 3.5*  CREATININE 1.64* 1.46*  --     Estimated Creatinine Clearance: 31.2 mL/min (A) (by C-G formula based on SCr of 1.46 mg/dL (H)).  Assessment: 85 yo female who presented with SOB. PTA the patient is on warfarin at home for hx of DVT. Of note, the patient has a history of rectus sheath hematoma while on enoxaparin and had and IVC filter placed.  INR upon admission was supratherapeutic at 3.7. The last reported dose of warfarin was on 12/23 @ 1900. Pharmacy is consulted to dose warfarin.   PTA warfarin dose: 7.5 mg PO daily   INR today continues to be supratherapeutic at 3.5. Hgb low/stable in 9's, PLTs WNL. LFTs slightly elevated (AST 102, ALT 50). Patient only ate 30% of meal yesterday.  Goal of Therapy:  INR 2-3 Monitor platelets by anticoagulation protocol: Yes   Plan:  Hold warfarin today Daily PT/INR, CBC q72h Monitor for any signs/symptoms of bleeding  Thank you for allowing pharmacy to be a part of this patient's care.  Kinnie Feil, PharmD PGY1 Acute Care Pharmacy Resident Phone: [phone] 08/18/2020 8:16 AM  Please check [email or web address] for unit specific pharmacy phone numbers.

## 2020-08-19 ENCOUNTER — Other Ambulatory Visit: Payer: Self-pay | Admitting: Nurse Practitioner

## 2020-08-19 DIAGNOSIS — I5033 Acute on chronic diastolic (congestive) heart failure: Secondary | ICD-10-CM | POA: Diagnosis not present

## 2020-08-19 DIAGNOSIS — R7989 Other specified abnormal findings of blood chemistry: Secondary | ICD-10-CM | POA: Diagnosis not present

## 2020-08-19 DIAGNOSIS — C241 Malignant neoplasm of ampulla of Vater: Secondary | ICD-10-CM | POA: Diagnosis not present

## 2020-08-19 DIAGNOSIS — I639 Cerebral infarction, unspecified: Secondary | ICD-10-CM | POA: Diagnosis not present

## 2020-08-19 DIAGNOSIS — R791 Abnormal coagulation profile: Secondary | ICD-10-CM | POA: Diagnosis not present

## 2020-08-19 LAB — PROTIME-INR
INR: 2.9 — ABNORMAL HIGH (ref 0.8–1.2)
Prothrombin Time: 29.4 seconds — ABNORMAL HIGH (ref 11.4–15.2)

## 2020-08-19 LAB — GLUCOSE, CAPILLARY
Glucose-Capillary: 128 mg/dL — ABNORMAL HIGH (ref 70–99)
Glucose-Capillary: 154 mg/dL — ABNORMAL HIGH (ref 70–99)
Glucose-Capillary: 118 mg/dL — ABNORMAL HIGH (ref 70–99)
Glucose-Capillary: 118 mg/dL — ABNORMAL HIGH (ref 70–99)

## 2020-08-19 LAB — SARS CORONAVIRUS 2 (TAT 6-24 HRS): SARS Coronavirus 2: NEGATIVE

## 2020-08-19 MED ORDER — WARFARIN SODIUM 2.5 MG PO TABS: 2.5000 mg | ORAL_TABLET | Freq: Every day | ORAL | 11 refills | Status: AC

## 2020-08-19 MED ORDER — OXYCODONE-ACETAMINOPHEN 5-325 MG PO TABS: 1.0000 | ORAL_TABLET | Freq: Three times a day (TID) | ORAL | 0 refills | Status: AC | PRN

## 2020-08-19 MED ORDER — FUROSEMIDE 40 MG PO TABS
40.0000 mg | ORAL_TABLET | Freq: Every day | ORAL | Status: DC
  Administered 2020-08-19 – 2020-08-20 (×2): 40 mg via ORAL
  Filled 2020-08-19 (×2): qty 1

## 2020-08-19 MED ORDER — WARFARIN SODIUM 1 MG PO TABS
1.0000 mg | ORAL_TABLET | Freq: Once | ORAL | Status: AC
  Administered 2020-08-19: 1 mg via ORAL
  Filled 2020-08-19 (×2): qty 1

## 2020-08-19 NOTE — Progress Notes (Signed)
IP PROGRESS NOTE  Subjective:   Kaylee Mullins continues to have abdominal pain.  The pain is not severe.  She remains weak.  She understands that she is unable to go home in her current condition. Objective: Vital signs in last 24 hours: Blood pressure (!) 160/62, pulse (!) 54, temperature 97.7 F (36.5 C), temperature source Oral, resp. rate 19, height 5\' 4"  (1.626 m), weight 194 lb 14.2 oz (88.4 kg), SpO2 100 %.  Intake/Output from previous day: 01/02 0701 - 01/03 0700 In: [id number] [P.O.:777; I.V.:6] Out: 700 [Urine:700]  Physical Exam:    Abdomen: Soft, no hepatosplenomegaly, nontender, no mass Extremities: No leg edema Neurologic: Alert, follows commands, moves all extremities to command, speech is fluent, decreased proximal leg strength bilaterally  Portacath/PICC-without erythema  Lab Results: Recent Labs    08/17/20 0511  WBC 13.9*  HGB 9.4*  HCT 30.8*  PLT 485*    BMET Recent Labs    08/17/20 0511  NA 142  K 4.1  CL 109  CO2 23  GLUCOSE 162*  BUN 36*  CREATININE 1.46*  CALCIUM 7.4*     Medications: I have reviewed the patient's current medications.  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 CT abdomen/pelvis 10/23/2016-ampullary mass, single mildly enlarged porta hepatis lymph node, no evidence of distant metastatic disease ? Status post pancreaticoduodenectomy 01/12/2017; pT3pN2 ? CT abdomen/pelvis 03/30/2018-ablation defect within the upper pole of the right kidney. No evidence of recurrent renal mass. New mild left periaortic retroperitoneal lymphadenopathy. ? CT abdomen/pelvis 06/30/2018-6 mm right middle lobe nodule-slowly growing over multiple CTs, stable ablation defect in the right kidney, 1.9 cm left periaortic node increased from 1.2 cm, stable 1.5 cm porta hepatis node ? CT abdomen/pelvis 09/17/2018-surgical changes related to Whipple  procedure. Stable upper abdominal/retroperitoneal lymphadenopathy. 5 mm right middle lobe nodule, stable from most recent CT but mildly progressive from priors. Postprocedural changes related to renal ablation in the medial right upper kidney. ? CTs 03/24/2019-a few scattered pulmonary nodules appears stable; liver has a slightly shrunken appearance and nodular appearance compatible with mild cirrhosis. No suspicious cystic or solid hepatic lesions. Upper abdominal and retroperitoneal lymphadenopathy appears similar compared to the prior study. No other new lymphadenopathy noted elsewhere in the abdomen or pelvis. ? PET scan 07/03/2019-hypermetabolic lymph node adjacent porta hepatis. Newfocal hypermetabolic lesion in the liver. Hypermetabolic activity in the left suprarenal location favored metastatic adenopathy in the retroperitoneum. Diffuse hypermetabolic activity within the stomach favor gastritis. ? Cycle 1 gemcitabine/Abraxane 08/31/2019 ? Cycle 2 gemcitabine/Abraxane 09/14/2019 ? Cycle 3 gemcitabine/Abraxane 09/29/2019 ? Cycle 4 gemcitabine/Abraxane 10/13/2019 ? Cycle 5 gemcitabine/Abraxane 10/27/2019 ? Cycle 6 gemcitabine/Abraxane 11/09/2019 ? CTs 11/13/2019-new 2 mm right apical and 3 mm left lower lobe nodules, stable portacaval node, enlargement of amorphous soft tissue encasing the left renal artery, stable liver lesion ? Cycle 7 gemcitabine/Abraxane 11/24/2019 ? Cycle 8 gemcitabine/Abraxane 12/07/2019 ? 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 Cycle 10 gemcitabine/Abraxane 01/05/2020 ? Cycle 11 gemcitabine/Abraxane 01/18/2020 ? CTs 01/30/2020-small right pleural effusion. Rounded peripheral mass along the margin of the right lower lobe posteriorly adjacent to the pleural effusion 3.0 x 1.9 cm compared to previous measurement of 3.0 x 1.9 cm. Stable 4 mm right upper lobe nodule. Stable 0.6 x 0.5 cm right middle lobe nodule. Stable 3 mm nodule in the superior  segment left lower lobe. No new nodules identified. Reduced conspicuity of margins of the  moderately heterogeneous anterior liver lesion measuring 5.2 x 2.5 x 4.4 cm, previous measurement 3.0 x 2.4 x 3.4 cm. Nodular contour of the liver. Region of hypodensity in segment 5 measuring 2.6 x 1.8 cm, previously 2.0 x 1.3 cm. Portacaval node 1.2 cm, previously 1.3 cm. Porta hepatis node 0.9 cm, formerly 1.3 cm. Abnormal indistinctly marginated soft tissue density probably reflecting nodal tissue surrounding the left proximal renal artery measuring 1.8 cm, formerly 1.9 cm. ? Cycle 12 gemcitabine/Abraxane 02/01/2020 ? Cycle13gemcitabine/Abraxane 02/16/2020 ? Cycle 14 gemcitabine/Abraxane 02/29/2020 ? 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 Cycle 16 gemcitabine 04/05/2020 (Abraxane held due to neuropathy) ? Cycle 17 gemcitabine 04/18/2020 (Abraxane held due to neuropathy) ? CTs 04/30/2020-previously noted pleural-based masslike lesion in the posterior aspect of the right lower lobe with slow increase in size over numerous prior examinations. Slight regression of multiple liver metastases. No new liver lesions noted. Stable left para-aortic lymphadenopathy. ? Continue every 2-week gemcitabine ? Cycle 18 gemcitabine 05/02/2020 (Abraxane held due to neuropathy) ? Cycle 19gemcitabine 05/16/2020 ? Cycle 20 gemcitabine/Abraxane 05/30/2020 ? Cycle 21 gemcitabine/Abraxane 06/14/2020 ? Cycle 22 gemcitabine/Abraxane 06/27/2020 ? Cycle 23 gemcitabine/Abraxane 07/18/2020 ? Cycle 24 gemcitabine/Abraxane 08/02/2020 2. Biliary obstruction secondary to #1, status post placement of a metal, bile duct stent on 10/23/2016  3. 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 Renal cell carcinoma-status post ablation of a right renal mass 07/15/2016, biopsy confirmed papillary renal cell carcinoma, Fuhrman grade 3  5. CVA in 2016  6. History of thyroid cancer-status post  thyroidectomy and radioactive iodine in 2005  7. Diabetes  8.Hypertension  9.Left lower extremity deep vein thrombosis June 2019, IVC filter placed July 2018 after she was diagnosed with a rectus hematoma while on Lovenox  10. Hospitalized 04/12/2019 through 04/14/2019 with traumatic hematoma right lower leg, followed at the wound clinic  11.Port-A-Cath placement interventional radiology 08/29/2019  12.Covid vaccine-second injection administered 11/10/2019; booster 04/05/2020  13.  Resolving right buttock rash 08/02/2020-likely a zoster rash  14.  Admission 08/09/2020 with dyspnea  15.  Acute renal failure during admission December 2021-improved  16.  Altered mental status-etiology unclear, improved 08/14/2020  17.  Urinary tract infection 08/13/2020-E. coli, treated with ceftriaxone  18.  Multiple acute infarcts noted on brain MRI 08/14/2020, likely watershed infarcts from hypotension  Ms. Sermeno remains alert and appears to be slowly improving following the hospital admission with urinary tract infection, renal failure, and CVAs.  She will be discharged to a skilled nursing facility.  We will consider obtaining a restaging CT if her performance status improves.  She will not be a candidate for further chemotherapy unless she becomes ambulatory.  Recommendation:  1.  Discharge to skilled nursing facility to continue physical therapy 2.  Physical therapy 3.  Follow-up as scheduled at the Cancer center on 08/31/2019     LOS: 9 days   Thornton Papas, MD   08/19/2020, 2:08 PM

## 2020-08-19 NOTE — Progress Notes (Signed)
Physical Therapy Treatment Patient Details Name: Kaylee Mullins MRN: [id number] DOB: June 12, 1936 Today's Date: 08/19/2020    History of Present Illness Pt is an 85 y.o. female admitted 08/09/20 with generalized weakness and SOB; reports she has not been feeling well since chemo tx last Friday. Workup for acute hypoxic respiratory failure, initially requiring BiPAP. Pt with decr arousal on 12/28 and MRI on 12/29 showed multifocal bilateral patchy acute infarctions of the cerebral hemispheres bilaterally. PMH includes CVA, CKD, DM, HTN, SVT, OA, depression, anxiety.    PT Comments    Pt progressing slowly towards her physical therapy goals. Able to stand today x 2 with two person moderate assist. Unfortunately, remains limited by continued bowel incontinence. Returned to supine to provide peri care and linen change. Continue to recommend SNF for ongoing Physical Therapy.       Follow Up Recommendations  SNF;Supervision for mobility/OOB     Equipment Recommendations  3in1 (PT);Wheelchair (measurements PT);Wheelchair cushion (measurements PT);Hospital bed    Recommendations for Other Services       Precautions / Restrictions Precautions Precautions: Fall Precaution Comments: incontinence Restrictions Weight Bearing Restrictions: No    Mobility  Bed Mobility Overal bed mobility: Needs Assistance Bed Mobility: Supine to Sit;Sit to Supine;Rolling Rolling: Mod assist;+2 for physical assistance   Supine to sit: +2 for physical assistance;Mod assist Sit to supine: +2 for physical assistance;Max assist   General bed mobility comments: Pt requiring cues for initiation and assist for BLE's off edge of bed, able to bring trunk to upright position with use of bed rails. MaxA + 2 for return to bed with BLE elevation and trunk guidance. Rolling to right and left with modA + 2 for peri care and linen change  Transfers Overall transfer level: Needs assistance Equipment used: Rolling walker  (2 wheeled) Transfers: Sit to/from Stand Sit to Stand: Mod assist;+2 physical assistance         General transfer comment: ModA + 2 to rise to stand from edge of bed x 2, pt with good initiation but difficulty with execution with crouched posture  Ambulation/Gait                 Stairs             Wheelchair Mobility    Modified Rankin (Stroke Patients Only)       Balance Overall balance assessment: Needs assistance Sitting-balance support: Feet supported;Bilateral upper extremity supported Sitting balance-Leahy Scale: Poor Sitting balance - Comments: reliant on BUE support, supervision-min guard for safety   Standing balance support: Bilateral upper extremity supported Standing balance-Leahy Scale: Poor Standing balance comment: reliant on walker and external assist                            Cognition Arousal/Alertness: Awake/alert Behavior During Therapy: Flat affect Overall Cognitive Status: Impaired/Different from baseline Area of Impairment: Orientation;Attention;Memory;Following commands;Safety/judgement;Awareness;Problem solving                 Orientation Level: Disoriented to;Situation;Time Current Attention Level: Selective Memory: Decreased short-term memory Following Commands: Follows one step commands inconsistently;Follows one step commands with increased time Safety/Judgement: Decreased awareness of safety;Decreased awareness of deficits Awareness: Intellectual Problem Solving: Requires verbal cues;Slow processing;Difficulty sequencing General Comments: Pt oriented to self, place, not oriented to time stating it was June. Pt with difficulty sequencing, requiring multimodal cues. Decreased awareness of safety/deficits. Fear of falling.      Exercises  General Comments        Pertinent Vitals/Pain Pain Assessment: Faces Faces Pain Scale: Hurts even more Pain Location: lower abdomen, bottom Pain Descriptors /  Indicators: Grimacing;Guarding Pain Intervention(s): Limited activity within patient's tolerance;Monitored during session    Home Living                      Prior Function            PT Goals (current goals can now be found in the care plan section) Acute Rehab PT Goals Patient Stated Goal: "to walk again." PT Goal Formulation: With patient Time For Goal Achievement: 08/24/20 Potential to Achieve Goals: Fair Progress towards PT goals: Progressing toward goals    Frequency    Min 2X/week      PT Plan Current plan remains appropriate    Co-evaluation PT/OT/SLP Co-Evaluation/Treatment: Yes Reason for Co-Treatment: For patient/therapist safety;To address functional/ADL transfers PT goals addressed during session: Mobility/safety with mobility        AM-PAC PT "6 Clicks" Mobility   Outcome Measure  Help needed turning from your back to your side while in a flat bed without using bedrails?: A Lot Help needed moving from lying on your back to sitting on the side of a flat bed without using bedrails?: A Lot Help needed moving to and from a bed to a chair (including a wheelchair)?: Total Help needed standing up from a chair using your arms (e.g., wheelchair or bedside chair)?: A Lot Help needed to walk in hospital room?: Total Help needed climbing 3-5 steps with a railing? : Total 6 Click Score: 9    End of Session Equipment Utilized During Treatment: Gait belt Activity Tolerance: Patient limited by fatigue;Other (comment) (incontinence) Patient left: in bed;with call bell/phone within reach;with bed alarm set Nurse Communication: Mobility status PT Visit Diagnosis: Other abnormalities of gait and mobility (R26.89);Muscle weakness (generalized) (M62.81)     Time: [id number] PT Time Calculation (min) (ACUTE ONLY): 25 min  Charges:  $Therapeutic Activity: 8-22 mins                     Wyona Almas, PT, DPT Acute Rehabilitation Services Pager  [phone] Office [place] 08/19/2020, 10:14 AM

## 2020-08-19 NOTE — Progress Notes (Signed)
ANTICOAGULATION CONSULT NOTE  Pharmacy Consult for warfarin Indication: Hx DVT, CVA  Labs: Recent Labs    08/17/20 0511 08/18/20 0307 08/19/20 0346  HGB 9.4*  --   --   HCT 30.8*  --   --   PLT 485*  --   --   LABPROT 31.2* 34.2* 29.4*  INR 3.1* 3.5* 2.9*  CREATININE 1.46*  --   --     Estimated Creatinine Clearance: 32 mL/min (A) (by C-G formula based on SCr of 1.46 mg/dL (H)).  Assessment: 85 yo female who presented with SOB. PTA the patient is on warfarin at home for hx of DVT. Of note, the patient has a history of rectus sheath hematoma while on enoxaparin and had and IVC filter placed.  INR upon admission was supratherapeutic at 3.7. The last reported dose of warfarin was on 12/23 @ 1900. Pharmacy is consulted to dose warfarin.   PTA warfarin dose: 7.5 mg PO daily   INR today 2.9  Goal of Therapy:  INR 2-3 Monitor platelets by anticoagulation protocol: Yes   Plan:  Warfarin 1 mg po x 1 today Daily PT/INR, CBC q72h Monitor for any signs/symptoms of bleeding  Thank you Okey Regal, PharmD 08/19/2020 8:29 AM  Please check [email or web address] for unit specific pharmacy phone numbers.

## 2020-08-19 NOTE — Progress Notes (Addendum)
2x attempts to contact [facility]; no answer, left voicemail.   Auth started with [employer]. Ref# [id number]. Clinicals faxed.

## 2020-08-19 NOTE — Discharge Summary (Addendum)
Physician Discharge Summary  RITA PARCHMENT [id number] DOB: 1936-01-26 DOA: 08/09/2020  PCP: Nolene Ebbs, MD  Admit date: 08/09/2020 Discharge date: 08/20/2020  Admitted From: Home Disposition:  SNF  Recommendations for Outpatient Follow-up:  1. Follow up with PCP in 1-2 weeks 2. Please obtain BMP/CBC in one week   Home Health:No Equipment/Devices:None  Discharge Condition:Stable CODE STATUS: Full Diet recommendation: Heart Healthy   Brief/Interim Summary:  85 y.o. 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 on Coumadin thyroid cancer status post surgery and radiation diabetes mellitus type 2 came into the ED complaining of shortness of breath  Discharge Diagnoses:  Principal Problem:   Acute CVA (cerebrovascular accident) ([place]) Active Problems:   SOB (shortness of breath)   Elevated troponin   Shortness of breath   Acute on chronic diastolic CHF (congestive heart failure) (HCC) Dyspnea of unclear etiology: Chest x-ray was done showed no acute findings BNP was elevated at 4000 cardiology was consulted she did not appear fluid overloaded on physical exam cardiology recommended no further work-up.  Acute CVA: MRI of the brain was done that showed numerous scattered infarct in both hemispheres. Neurology was consulted recommended an MRI which was negative, they recommended to continue Coumadin physical therapy evaluated the patient and recommended skilled nursing facility. She will go home on statin and aspirin, neurology also recommended a 30-day Holter monitor to rule out A. fib. She will go home on Coumadin and aspirin.  Chronic diastolic heart failure: On admission she appears euvolemic she was started on her Lasix and her creatinine worsened. This was held her creatinine returned to baseline she will continue her home dose of Lasix and outpatient.  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 She will follow up with her oncologist as an outpatient.  Diabetes mellitus type 2: No change made to her medication.  History of DVT: INR is therapeutic, she will go home on a small dose of Coumadin 2.5 daily check an INR daily and titrate as needed.  Elevated troponins:  Likely demand ischemia no further work-up.  Hypercalcemia: This resolved with IV fluids and Kayexalate.  Leukocytosis possibly due to urinary tract infection. Urine cultures were sent she was started on IV Rocephin urine culture grew more than 100,000 colonies of E. coli she completed her treatment in house.   Discharge Instructions  Discharge Instructions    Ambulatory referral to Neurology   Complete by: As directed    Follow up with Dr. Leonie Man at [facility] in 4 weeks. Pt is Dr. Clydene Fake pt. Thanks.   Diet - low sodium heart healthy   Complete by: As directed    Increase activity slowly   Complete by: As directed      Allergies as of 08/20/2020      Reactions   Hydralazine Hcl Shortness Of Breath   Pt had severe respiratory distress after receiving a dose   Amlodipine Besylate Swelling   Darvon [propoxyphene Hcl] Other (See Comments)   Hallucinations   Nyquil Multi-symptom [pseudoeph-doxylamine-dm-apap] Other (See Comments)   Makes pt not "feel right in her head"   Metformin And Related Diarrhea      Medication List    TAKE these medications   acetaminophen 500 MG tablet Commonly known as: TYLENOL Take 500 mg by mouth every 6 (six) hours as needed for mild pain.   carvedilol 25 MG tablet Commonly known as: COREG Take  1 tablet (25 mg total) by mouth 2 (two) times daily.   furosemide 80 MG tablet Commonly known as: LASIX TAKE 1 TABLET BY MOUTH EVERY DAY ALTERNATING WITH 40MG    gabapentin 100 MG capsule Commonly known as: NEURONTIN Take 100 mg by mouth daily.   HumaLOG KwikPen 100 UNIT/ML KwikPen Generic drug:  insulin lispro Inject 7-10 Units into the skin See admin instructions. Three times daily per sliding scale   insulin detemir 100 UNIT/ML injection Commonly known as: LEVEMIR Inject 0.07 mLs (7 Units total) into the skin 2 (two) times daily. What changed:   how much to take  when to take this   levothyroxine 200 MCG tablet Commonly known as: SYNTHROID Take 200 mcg by mouth daily before breakfast. For hypothyroidism   lidocaine-prilocaine cream Commonly known as: EMLA Apply to port site 1-2 hours prior to use   MIRALAX PO Take 17 g by mouth daily as needed (constipation).   NIFEdipine 30 MG 24 hr tablet Commonly known as: PROCARDIA-XL/NIFEDICAL-XL Take 30 mg by mouth daily.   ondansetron 8 MG tablet Commonly known as: ZOFRAN Take 1 tablet (8 mg total) by mouth every 8 (eight) hours as needed for nausea or vomiting.   oxyCODONE-acetaminophen 5-325 MG tablet Commonly known as: PERCOCET/ROXICET Take 1-2 tablets by mouth every 8 (eight) hours as needed for severe pain.   pantoprazole 40 MG tablet Commonly known as: PROTONIX Take 1 tablet (40 mg total) by mouth at bedtime.   potassium chloride SA 20 MEQ tablet Commonly known as: Klor-Con M20 Take 3 tablets daily alternating with 1 tablet daily.  Take with furosemide.   rosuvastatin 20 MG tablet Commonly known as: CRESTOR Take 20 mg by mouth daily.   Vitamin D (Ergocalciferol) 1.25 MG (50000 UNIT) Caps capsule Commonly known as: DRISDOL TAKE 1 CAPSULE BY MOUTH ONE TIME PER WEEK What changed: See the new instructions.   warfarin 2.5 MG tablet Commonly known as: Coumadin Take 1 tablet (2.5 mg total) by mouth daily. What changed:   medication strength  how much to take  when to take this       Follow-up Information    Schedule an appointment as soon as possible for a visit  with Nolene Ebbs, MD.   Specialty: Internal Medicine Contact information: [place] [address] [phone]         [facility] EMERGENCY DEPARTMENT.   Specialty: Emergency Medicine Why: If symptoms worsen Contact information: [address] [id number] [place] [phone]       [facility], [place]. Go on 09/04/2020.   Specialties: Cardiology, Physician Assistant Why: @11 :15am for hospital follow up with Dr. Alan Ripper PA for hospital follow up  Contact information: [address] [address] [phone]        Garvin Fila, MD. Schedule an appointment as soon as possible for a visit in 4 week(s).   Specialties: Neurology, Radiology Contact information: [place] [address] [phone]        [facility] Office Follow up.   Specialty: Cardiology Why: cardiology office will reach out to you to arrange a 30 day event monitor.  Contact information: [address] [place] [address] [phone]             Allergies  Allergen Reactions  . Hydralazine Hcl Shortness Of Breath    Pt had severe respiratory distress after receiving a  dose  . Amlodipine Besylate Swelling  . Darvon [Propoxyphene Hcl] Other (See Comments)    Hallucinations   . Nyquil Multi-Symptom [Pseudoeph-Doxylamine-Dm-Apap] Other (See Comments)    Makes pt not "feel right in her head"  . Metformin And Related Diarrhea    Consultations: Hematology oncology Neurology  Procedures/Studies: MR ANGIO HEAD WO CONTRAST  Result Date: 08/15/2020 CLINICAL DATA:  Acute infarcts on MRI brain EXAM: MRA HEAD WITHOUT CONTRAST TECHNIQUE: Angiographic images of the Circle of Willis were obtained using MRA technique without intravenous contrast. COMPARISON:  None. FINDINGS: Intracranial internal carotid arteries are patent. Middle and anterior cerebral arteries are patent. Intracranial vertebral arteries, basilar artery, posterior cerebral arteries are patent. Posterior  communicating artery is identified on the left. There is no significant stenosis or aneurysm. IMPRESSION: Normal MRA of the head. Electronically Signed   By: Guadlupe Spanish M.D.   On: 08/15/2020 14:53   MR BRAIN WO CONTRAST  Result Date: 08/14/2020 CLINICAL DATA:  85 year old female with altered mental status and lethargy. Recent shortness of breath. History of pancreatic cancer. EXAM: MRI HEAD WITHOUT CONTRAST TECHNIQUE: Multiplanar, multiecho pulse sequences of the brain and surrounding structures were obtained without intravenous contrast. COMPARISON:  Brain MRI and intracranial MRA 09/16/2014 FINDINGS: Brain: Widely scattered small foci of restricted diffusion in both frontal lobes, posterior temporal lobes, occasionally the occipital lobe white matter. White matter mostly affected. Deep gray nuclei, brainstem, and cerebellum are spared. No associated acute hemorrhage or mass effect. Superimposed chronic patchy bilateral cerebral white matter and bilateral deep gray nuclei T2 and FLAIR heterogeneity is largely stable since 2016. Expected evolution of chronic lacunar infarct of the right thalamus since 2016. Progressed chronic microhemorrhages in the bilateral thalami over the same time. Chronic lacunar infarct in the left pons is stable. Small chronic calcification along the medial right tentorium is stable. No signal abnormality specific for vasogenic edema. No IV contrast administered. No midline shift, mass effect, evidence of mass lesion, ventriculomegaly, extra-axial collection or acute intracranial hemorrhage. Cervicomedullary junction and pituitary are within normal limits. Vascular: Major intracranial vascular flow voids are stable since 2016. Generalized intracranial artery tortuosity. Skull and upper cervical spine: Stable bone marrow signal since 2016. Chronic hyperostosis of the calvarium. Negative for age visible cervical spine. Sinuses/Orbits: Postoperative changes to both globes since 2016.  Paranasal Visualized paranasal sinuses and mastoids are stable and well pneumatized. Other: Grossly normal visible internal auditory structures. Negative visible scalp and face soft tissues. IMPRESSION: 1. Numerous scattered small acute infarcts in both cerebral hemispheres, primarily affecting white matter of the MCA territories. No associated hemorrhage or mass effect. Consider recent embolic event. A hypotensive episode might also have this appearance. 2. Underlying advanced chronic small vessel disease, with some progression since 2016. 3. No strong evidence of metastatic disease on this noncontrast exam. Electronically Signed   By: Odessa Fleming M.D.   On: 08/14/2020 06:19   DG Chest Portable 1 View  Result Date: 08/09/2020 CLINICAL DATA:  Shortness of breath for 1 day EXAM: PORTABLE CHEST 1 VIEW COMPARISON:  04/30/2020 FINDINGS: Cardiomegaly. Prominent markings at the bases correlating with pleural opacity on the right and scarring on the left by chest CT 04/30/2020. No acute airspace disease, edema, effusion, or pneumothorax. Porta catheter on the right tip at the upper cavoatrial junction. IMPRESSION: No evidence of acute disease. Electronically Signed   By: Marnee Spring M.D.   On: 08/09/2020 07:09   ECHOCARDIOGRAM COMPLETE  Result Date: 08/09/2020    ECHOCARDIOGRAM REPORT  Patient Name:   COBI MALCOMB Date of Exam: 08/09/2020 Medical Rec #:  [id number]       Height:       64.0 in Accession #:    [id number]      Weight:       186.0 lb Date of Birth:  07-21-1936      BSA:          1.897 m Patient Age:    85 years        BP:           109/54 mmHg Patient Gender: F               HR:           74 bpm. Exam Location:  Inpatient Procedure: 2D Echo, Cardiac Doppler and Color Doppler Indications:    Dyspnea  History:        Patient has prior history of Echocardiogram examinations, most                 recent 06/28/2017. Stroke, Arrythmias:SVT,                 Signs/Symptoms:Dyspnea; Risk Factors:Diabetes  and Hypertension.                 Elevated Trponin, chemo.  Sonographer:    Dustin Flock Referring Phys: [id number] [place]  1. Mild septal hypokinesis. Left ventricular ejection fraction, by estimation, is 50 to 55%. The left ventricle has low normal function. The left ventricle has no regional wall motion abnormalities. Left ventricular diastolic parameters are consistent with Grade II diastolic dysfunction (pseudonormalization). Elevated left ventricular end-diastolic pressure.  2. Right ventricular systolic function is normal. The right ventricular size is normal.  3. Left atrial size was mildly dilated.  4. The mitral valve is normal in structure. Trivial mitral valve regurgitation. No evidence of mitral stenosis.  5. The aortic valve is calcified. There is mild calcification of the aortic valve. There is mild thickening of the aortic valve. Aortic valve regurgitation is mild. No aortic stenosis is present.  6. The inferior vena cava is normal in size with greater than 50% respiratory variability, suggesting right atrial pressure of 3 mmHg. FINDINGS  Left Ventricle: Mild septal hypokinesis. Left ventricular ejection fraction, by estimation, is 50 to 55%. The left ventricle has low normal function. The left ventricle has no regional wall motion abnormalities. The left ventricular internal cavity size  was normal in size. There is no left ventricular hypertrophy. Left ventricular diastolic parameters are consistent with Grade II diastolic dysfunction (pseudonormalization). Elevated left ventricular end-diastolic pressure. Right Ventricle: The right ventricular size is normal. No increase in right ventricular wall thickness. Right ventricular systolic function is normal. Left Atrium: Left atrial size was mildly dilated. Right Atrium: Right atrial size was normal in size. Pericardium: Trivial pericardial effusion is present. Mitral Valve: The mitral valve is normal in structure. There is  moderate thickening of the mitral valve leaflet(s). Trivial mitral valve regurgitation. No evidence of mitral valve stenosis. Tricuspid Valve: The tricuspid valve is normal in structure. Tricuspid valve regurgitation is trivial. No evidence of tricuspid stenosis. Aortic Valve: The aortic valve is calcified. There is mild calcification of the aortic valve. There is mild thickening of the aortic valve. Aortic valve regurgitation is mild. Aortic regurgitation PHT measures 397 msec. No aortic stenosis is present. Pulmonic Valve: The pulmonic valve was normal in structure. Pulmonic valve regurgitation is not visualized. No evidence of pulmonic  stenosis. Aorta: The aortic root is normal in size and structure. Venous: The inferior vena cava is normal in size with greater than 50% respiratory variability, suggesting right atrial pressure of 3 mmHg. IAS/Shunts: No atrial level shunt detected by color flow Doppler.  LEFT VENTRICLE PLAX 2D LVIDd:         4.50 cm  Diastology LVIDs:         3.20 cm  LV e' medial:    4.79 cm/s LV PW:         1.50 cm  LV E/e' medial:  17.1 LV IVS:        1.20 cm  LV e' lateral:   7.18 cm/s LVOT diam:     2.30 cm  LV E/e' lateral: 11.4 LV SV:         57 LV SV Index:   30 LVOT Area:     4.15 cm  RIGHT VENTRICLE RV Basal diam:  2.40 cm RV S prime:     8.27 cm/s TAPSE (M-mode): 2.0 cm LEFT ATRIUM             Index       RIGHT ATRIUM           Index LA diam:        5.20 cm 2.74 cm/m  RA Area:     12.70 cm LA Vol (A2C):   60.4 ml 31.84 ml/m RA Volume:   24.20 ml  12.76 ml/m LA Vol (A4C):   58.4 ml 30.79 ml/m LA Biplane Vol: 59.9 ml 31.58 ml/m  AORTIC VALVE LVOT Vmax:   69.80 cm/s LVOT Vmean:  45.900 cm/s LVOT VTI:    0.137 m AI PHT:      397 msec  AORTA Ao Root diam: 2.90 cm MITRAL VALVE MV Area (PHT): 3.31 cm    SHUNTS MV Decel Time: 229 msec    Systemic VTI:  0.14 m MV E velocity: 81.80 cm/s  Systemic Diam: 2.30 cm MV A velocity: 78.00 cm/s MV E/A ratio:  1.05 Skeet Latch MD  Electronically signed by Skeet Latch MD Signature Date/Time: 08/09/2020/2:47:19 PM    Final    VAS US CAROTID  Result Date: 08/15/2020 Carotid Arterial Duplex Study Indications:       CVA. Risk Factors:      Hypertension, hyperlipidemia, Diabetes, no history of                    smoking. Comparison Study:  Prev 09/2016 Performing Technologist: Vonzell Schlatter RVT  Examination Guidelines: A complete evaluation includes B-mode imaging, spectral Doppler, color Doppler, and power Doppler as needed of all accessible portions of each vessel. Bilateral testing is considered an integral part of a complete examination. Limited examinations for reoccurring indications may be performed as noted.  Right Carotid Findings: +----------+--------+--------+--------+------------------+--------+           PSV cm/sEDV cm/sStenosisPlaque DescriptionComments +----------+--------+--------+--------+------------------+--------+ CCA Prox  58      11                                         +----------+--------+--------+--------+------------------+--------+ CCA Distal56      12                                         +----------+--------+--------+--------+------------------+--------+ ICA Prox  59      14      1-39%   heterogenous               +----------+--------+--------+--------+------------------+--------+ ICA Distal68      15                                         +----------+--------+--------+--------+------------------+--------+ ECA       63                                                 +----------+--------+--------+--------+------------------+--------+ +----------+--------+-------+--------+-------------------+           PSV cm/sEDV cmsDescribeArm Pressure (mmHG) +----------+--------+-------+--------+-------------------+ [id number]                                        +----------+--------+-------+--------+-------------------+ +---------+--------+--+--------+--+  VertebralPSV cm/s69EDV cm/s14 +---------+--------+--+--------+--+  Left Carotid Findings: +----------+--------+--------+--------+------------------+--------+           PSV cm/sEDV cm/sStenosisPlaque DescriptionComments +----------+--------+--------+--------+------------------+--------+ CCA Prox  82      20                                         +----------+--------+--------+--------+------------------+--------+ CCA Distal67      13                                         +----------+--------+--------+--------+------------------+--------+ ICA Prox  65      18      1-39%   heterogenous               +----------+--------+--------+--------+------------------+--------+ ICA Distal48      10                                tortuous +----------+--------+--------+--------+------------------+--------+ ECA       54                                                 +----------+--------+--------+--------+------------------+--------+ +----------+--------+--------+--------+-------------------+           PSV cm/sEDV cm/sDescribeArm Pressure (mmHG) +----------+--------+--------+--------+-------------------+ [id number]                                         +----------+--------+--------+--------+-------------------+ +---------+--------+--+--------+-+ VertebralPSV cm/s44EDV cm/s8 +---------+--------+--+--------+-+   Summary: Right Carotid: Velocities in the right ICA are consistent with a 1-39% stenosis. Left Carotid: Velocities in the left ICA are consistent with a 1-39% stenosis. Vertebrals:  Bilateral vertebral arteries demonstrate antegrade flow. Subclavians: Normal flow hemodynamics were seen in bilateral subclavian              arteries. *See table(s) above for measurements and observations.  Electronically signed by Antony Contras MD on 08/15/2020 at 5:49:54 PM.    Final  US Abdomen Limited RUQ (LIVER/GB)  Result Date: 08/13/2020 CLINICAL DATA:  Elevated  liver enzymes. History of pancreatic carcinoma EXAM: ULTRASOUND ABDOMEN LIMITED RIGHT UPPER QUADRANT COMPARISON:  CT abdomen and pelvis April 30, 2020 FINDINGS: Gallbladder: Surgically absent. Common bile duct: Diameter: 8 mm, within normal limits for post cholecystectomy state. No appreciable intrahepatic, common hepatic, or common bile duct dilatation. Liver: The liver has an overall increased, inhomogeneous echotexture with nodular contour. Complex partially cystic area arising in the anterior right lobe of the liver measures 5.9 x 4.4 x 6.7 cm. A complex mass in the anterior segment right lobe of the liver measures 3.5 x 2.5 x 4.0 cm. Smaller masslike areas are noted elsewhere throughout the liver, consistent with metastatic foci. Portal vein is patent on color Doppler imaging with normal direction of blood flow towards the liver. Other: None. IMPRESSION: 1. Liver has a nodular contour suggesting underlying cirrhosis. Multiple liver masses likely represent metastatic foci from known pancreatic carcinoma. Degenerating cystic lesion in the anterior segment right lobe of the liver potentially may represent residua from treated metastasis versus infected cystic area. Correlation with CT to assess for possible developing abscess in this area of the liver may be reasonable. 2.  Gallbladder absent. These results will be called to the ordering clinician or representative by the Radiologist Assistant, and communication documented in the PACS or [employer]. Electronically Signed   By: Lowella Grip III M.D.   On: 08/13/2020 10:34    Subjective: No new complaints.  Discharge Exam: Vitals:   08/20/20 0108 08/20/20 0656  BP: (!) 143/73 131/67  Pulse: (!) 57 (!) 55  Resp: 18 19  Temp: 98.7 F (37.1 C) 98 F (36.7 C)  SpO2: 100% 100%   Vitals:   08/19/20 1632 08/19/20 2213 08/20/20 0108 08/20/20 0656  BP: (!) 155/92 (!) 158/58 (!) 143/73 131/67  Pulse: (!) 55 (!) 56 (!) 57 (!) 55  Resp: 20 18  18 19   Temp: (!) 97.2 F (36.2 C) 98.9 F (37.2 C) 98.7 F (37.1 C) 98 F (36.7 C)  TempSrc: Oral Oral Oral   SpO2: 99% 100% 100% 100%  Weight:      Height:        General: Pt is alert, awake, not in acute distress Cardiovascular: RRR, S1/S2 +, no rubs, no gallops Respiratory: CTA bilaterally, no wheezing, no rhonchi Abdominal: Soft, NT, ND, bowel sounds + Extremities: no edema, no cyanosis    The results of significant diagnostics from this hospitalization (including imaging, microbiology, ancillary and laboratory) are listed below for reference.     Microbiology: Recent Results (from the past 240 hour(s))  Culture, Urine     Status: Abnormal   Collection Time: 08/14/20  2:15 AM   Specimen: Urine, Random  Result Value Ref Range Status   Specimen Description URINE, RANDOM  Final   Special Requests   Final    NONE Performed at [facility], [address] [place], [place] [address]    Culture >=100,000 COLONIES/mL ESCHERICHIA COLI (A)  Final   Report Status 08/16/2020 FINAL  Final   Organism ID, Bacteria ESCHERICHIA COLI (A)  Final      Susceptibility   Escherichia coli - MIC*    AMPICILLIN 8 SENSITIVE Sensitive     CEFAZOLIN <=4 SENSITIVE Sensitive     CEFEPIME <=0.12 SENSITIVE Sensitive     CEFTRIAXONE <=0.25 SENSITIVE Sensitive     CIPROFLOXACIN <=0.25 SENSITIVE Sensitive     GENTAMICIN <=1 SENSITIVE Sensitive  IMIPENEM <=0.25 SENSITIVE Sensitive     NITROFURANTOIN 32 SENSITIVE Sensitive     TRIMETH/SULFA <=20 SENSITIVE Sensitive     AMPICILLIN/SULBACTAM 4 SENSITIVE Sensitive     PIP/TAZO <=4 SENSITIVE Sensitive     * >=100,000 COLONIES/mL ESCHERICHIA COLI  SARS CORONAVIRUS 2 (TAT 6-24 HRS) Nasopharyngeal Nasopharyngeal Swab     Status: None   Collection Time: 08/19/20 11:47 AM   Specimen: Nasopharyngeal Swab  Result Value Ref Range Status   SARS Coronavirus 2 NEGATIVE NEGATIVE Final    Comment: (NOTE) SARS-CoV-2 target nucleic acids are NOT  DETECTED.  The SARS-CoV-2 RNA is generally detectable in upper and lower respiratory specimens during the acute phase of infection. Negative results do not preclude SARS-CoV-2 infection, do not rule out co-infections with other pathogens, and should not be used as the sole basis for treatment or other patient management decisions. Negative results must be combined with clinical observations, patient history, and epidemiological information. The expected result is Negative.  Fact Sheet for Patients: SugarRoll.be  Fact Sheet for Healthcare Providers: https://www.woods-mathews.com/  This test is not yet approved or cleared by the [place] FDA and  has been authorized for detection and/or diagnosis of SARS-CoV-2 by FDA under an Emergency Use Authorization (EUA). This EUA will remain  in effect (meaning this test can be used) for the duration of the COVID-19 declaration under Se ction 564(b)(1) of the Act, 21 U.S.C. section 360bbb-3(b)(1), unless the authorization is terminated or revoked sooner.  Performed at [facility], [employer] [address], [place], [place] [address]      Labs: BNP (last 3 results) Recent Labs    08/09/20 0701  BNP [id number]*   Basic Metabolic Panel: Recent Labs  Lab 08/13/20 1611 08/14/20 0357 08/15/20 0249 08/16/20 0254 08/17/20 0511  NA 138 144 139 140 142  K 4.3 4.3 4.1 3.8 4.1  CL 102 107 104 107 109  CO2 26 26 25 27 23   GLUCOSE 132* 96 202* 131* 162*  BUN 46* 48* 49* 40* 36*  CREATININE 2.22* 2.24* 1.99* 1.64* 1.46*  CALCIUM 7.3* 7.5* 7.2* 7.3* 7.4*   Liver Function Tests: No results for input(s): AST, ALT, ALKPHOS, BILITOT, PROT, ALBUMIN in the last 168 hours. No results for input(s): LIPASE, AMYLASE in the last 168 hours. Recent Labs  Lab 08/13/20 2014  AMMONIA 27   CBC: Recent Labs  Lab 08/14/20 0357 08/17/20 0511 08/20/20 0441  WBC 22.8* 13.9* 10.7*  NEUTROABS 18.0*  --   --   HGB  9.6* 9.4* 8.7*  HCT 29.2* 30.8* 26.8*  MCV 93.9 97.2 94.0  PLT 339 485* 448*   Cardiac Enzymes: No results for input(s): CKTOTAL, CKMB, CKMBINDEX, TROPONINI in the last 168 hours. BNP: Invalid input(s): POCBNP CBG: Recent Labs  Lab 08/19/20 0642 08/19/20 1102 08/19/20 1613 08/19/20 2211 08/20/20 0659  GLUCAP 128* 154* 118* 118* 78   D-Dimer No results for input(s): DDIMER in the last 72 hours. Hgb A1c No results for input(s): HGBA1C in the last 72 hours. Lipid Profile No results for input(s): CHOL, HDL, LDLCALC, TRIG, CHOLHDL, LDLDIRECT in the last 72 hours. Thyroid function studies No results for input(s): TSH, T4TOTAL, T3FREE, THYROIDAB in the last 72 hours.  Invalid input(s): FREET3 Anemia work up No results for input(s): VITAMINB12, FOLATE, FERRITIN, TIBC, IRON, RETICCTPCT in the last 72 hours. Urinalysis    Component Value Date/Time   COLORURINE AMBER (A) 08/13/2020 2350   APPEARANCEUR CLOUDY (A) 08/13/2020 2350   LABSPEC 1.013 08/13/2020 2350  PHURINE 5.0 08/13/2020 2350   GLUCOSEU NEGATIVE 08/13/2020 2350   HGBUR MODERATE (A) 08/13/2020 2350   BILIRUBINUR NEGATIVE 08/13/2020 2350   KETONESUR NEGATIVE 08/13/2020 2350   PROTEINUR 30 (A) 08/13/2020 2350   UROBILINOGEN 0.2 09/16/2014 1317   NITRITE NEGATIVE 08/13/2020 2350   LEUKOCYTESUR LARGE (A) 08/13/2020 2350   Sepsis Labs Invalid input(s): PROCALCITONIN,  WBC,  LACTICIDVEN Microbiology Recent Results (from the past 240 hour(s))  Culture, Urine     Status: Abnormal   Collection Time: 08/14/20  2:15 AM   Specimen: Urine, Random  Result Value Ref Range Status   Specimen Description URINE, RANDOM  Final   Special Requests   Final    NONE Performed at [facility], [place] [address] [place], [place] [address]    Culture >=100,000 COLONIES/mL ESCHERICHIA COLI (A)  Final   Report Status 08/16/2020 FINAL  Final   Organism ID, Bacteria ESCHERICHIA COLI (A)  Final      Susceptibility   Escherichia  coli - MIC*    AMPICILLIN 8 SENSITIVE Sensitive     CEFAZOLIN <=4 SENSITIVE Sensitive     CEFEPIME <=0.12 SENSITIVE Sensitive     CEFTRIAXONE <=0.25 SENSITIVE Sensitive     CIPROFLOXACIN <=0.25 SENSITIVE Sensitive     GENTAMICIN <=1 SENSITIVE Sensitive     IMIPENEM <=0.25 SENSITIVE Sensitive     NITROFURANTOIN 32 SENSITIVE Sensitive     TRIMETH/SULFA <=20 SENSITIVE Sensitive     AMPICILLIN/SULBACTAM 4 SENSITIVE Sensitive     PIP/TAZO <=4 SENSITIVE Sensitive     * >=100,000 COLONIES/mL ESCHERICHIA COLI  SARS CORONAVIRUS 2 (TAT 6-24 HRS) Nasopharyngeal Nasopharyngeal Swab     Status: None   Collection Time: 08/19/20 11:47 AM   Specimen: Nasopharyngeal Swab  Result Value Ref Range Status   SARS Coronavirus 2 NEGATIVE NEGATIVE Final    Comment: (NOTE) SARS-CoV-2 target nucleic acids are NOT DETECTED.  The SARS-CoV-2 RNA is generally detectable in upper and lower respiratory specimens during the acute phase of infection. Negative results do not preclude SARS-CoV-2 infection, do not rule out co-infections with other pathogens, and should not be used as the sole basis for treatment or other patient management decisions. Negative results must be combined with clinical observations, patient history, and epidemiological information. The expected result is Negative.  Fact Sheet for Patients: SugarRoll.be  Fact Sheet for Healthcare Providers: https://www.woods-mathews.com/  This test is not yet approved or cleared by the [place] FDA and  has been authorized for detection and/or diagnosis of SARS-CoV-2 by FDA under an Emergency Use Authorization (EUA). This EUA will remain  in effect (meaning this test can be used) for the duration of the COVID-19 declaration under Se ction 564(b)(1) of the Act, 21 U.S.C. section 360bbb-3(b)(1), unless the authorization is terminated or revoked sooner.  Performed at [facility], [place] [address]  [place], [place] [address]      Time coordinating discharge: Over 30 minutes  SIGNED:   Charlynne Cousins, MD  [facility] 08/20/2020, 10:34 AM Pager   If 7PM-7AM, please contact night-coverage [email or web address] Password [email or web address]

## 2020-08-19 NOTE — Progress Notes (Signed)
Occupational Therapy Treatment Patient Details Name: Kaylee Mullins MRN: [id number] DOB: [birth year] Today's Date: 08/19/2020    History of present illness Pt is an 85 y.o. female admitted 08/09/20 with generalized weakness and SOB; reports she has not been feeling well since chemo tx last Friday. Workup for acute hypoxic respiratory failure, initially requiring BiPAP. Pt with decr arousal on 12/28 and MRI on 12/29 showed multifocal bilateral patchy acute infarctions of the cerebral hemispheres bilaterally. PMH includes CVA, CKD, DM, HTN, SVT, OA, depression, anxiety.   OT comments  Patient continues to make progress towards goals in skilled OT session. Patient's session encompassed co-treat with PT in order to address functional transfers and completion of ADLs. Pt remains minimally confused, and perseverative on snow outdoors. Pt required mod A for initiation to get to EOB, and completed two attempts to stand with mod A however limited to sit<>stands due to bowel incontinence. Pt requiring multi-modal cuing in order to complete all bed mobility and peri care. Discharge remains appropriate, therapy will continue to follow.    Follow Up Recommendations  SNF;Supervision/Assistance - 24 hour    Equipment Recommendations  3 in 1 bedside commode;Wheelchair (measurements OT);Wheelchair cushion (measurements OT);Hospital bed    Recommendations for Other Services      Precautions / Restrictions Precautions Precautions: Fall Precaution Comments: incontinence Restrictions Weight Bearing Restrictions: No       Mobility Bed Mobility Overal bed mobility: Needs Assistance Bed Mobility: Supine to Sit;Sit to Supine;Rolling Rolling: Mod assist;+2 for physical assistance   Supine to sit: +2 for physical assistance;Mod assist Sit to supine: +2 for physical assistance;Max assist   General bed mobility comments: Pt requiring cues for initiation and assist for BLE's off edge of bed, able to bring  trunk to upright position with use of bed rails. MaxA + 2 for return to bed with BLE elevation and trunk guidance. Rolling to right and left with modA + 2 for peri care and linen change  Transfers Overall transfer level: Needs assistance Equipment used: Rolling walker (2 wheeled) Transfers: Sit to/from Stand Sit to Stand: Mod assist;+2 physical assistance         General transfer comment: ModA + 2 to rise to stand from edge of bed x 2, pt with good initiation but difficulty with execution with crouched posture    Balance Overall balance assessment: Needs assistance Sitting-balance support: Feet supported;Bilateral upper extremity supported Sitting balance-Leahy Scale: Poor Sitting balance - Comments: reliant on BUE support, supervision-min guard for safety   Standing balance support: Bilateral upper extremity supported Standing balance-Leahy Scale: Poor Standing balance comment: reliant on walker and external assist                           ADL either performed or assessed with clinical judgement   ADL Overall ADL's : Needs assistance/impaired     Grooming: Wash/dry hands;Wash/dry face;Sitting Grooming Details (indicate cue type and reason): sitting EOB                     Toileting- Clothing Manipulation and Hygiene: Total assistance;Bed level;+2 for physical assistance       Functional mobility during ADLs: Moderate assistance;+2 for physical assistance;+2 for safety/equipment;Cueing for safety;Cueing for sequencing General ADL Comments: able to attempt standing x2 with mod A of 2, however session limited due to bowel incontenence, STM deficits noted at pt perseverating on snow out of window     Vision  Perception     Praxis      Cognition Arousal/Alertness: Awake/alert Behavior During Therapy: Flat affect Overall Cognitive Status: Impaired/Different from baseline Area of Impairment: Orientation;Attention;Memory;Following  commands;Safety/judgement;Awareness;Problem solving                 Orientation Level: Disoriented to;Situation;Time Current Attention Level: Selective Memory: Decreased short-term memory Following Commands: Follows one step commands inconsistently;Follows one step commands with increased time Safety/Judgement: Decreased awareness of safety;Decreased awareness of deficits Awareness: Intellectual Problem Solving: Requires verbal cues;Slow processing;Difficulty sequencing General Comments: Pt oriented to self, place, not oriented to time stating it was June. Pt with difficulty sequencing, requiring multimodal cues. Decreased awareness of safety/deficits. Fear of falling.        Exercises     Shoulder Instructions       General Comments      Pertinent Vitals/ Pain       Pain Assessment: Faces Faces Pain Scale: Hurts even more Pain Location: lower abdomen, bottom Pain Descriptors / Indicators: Grimacing;Guarding Pain Intervention(s): Limited activity within patient's tolerance;Monitored during session;Repositioned  Home Living                                          Prior Functioning/Environment              Frequency  Min 3X/week        Progress Toward Goals  OT Goals(current goals can now be found in the care plan section)  Progress towards OT goals: Progressing toward goals  Acute Rehab OT Goals Patient Stated Goal: "to walk again." OT Goal Formulation: With patient/family Time For Goal Achievement: 08/29/20 Potential to Achieve Goals: Fair  Plan Discharge plan remains appropriate    Co-evaluation    PT/OT/SLP Co-Evaluation/Treatment: Yes Reason for Co-Treatment: For patient/therapist safety;To address functional/ADL transfers PT goals addressed during session: Mobility/safety with mobility OT goals addressed during session: ADL's and self-care      AM-PAC OT "6 Clicks" Daily Activity     Outcome Measure   Help from another  person eating meals?: A Little Help from another person taking care of personal grooming?: A Little Help from another person toileting, which includes using toliet, bedpan, or urinal?: Total Help from another person bathing (including washing, rinsing, drying)?: Total Help from another person to put on and taking off regular upper body clothing?: A Lot Help from another person to put on and taking off regular lower body clothing?: Total 6 Click Score: 11    End of Session Equipment Utilized During Treatment: Rolling walker;Gait belt  OT Visit Diagnosis: Other symptoms and signs involving cognitive function;Muscle weakness (generalized) (M62.81)   Activity Tolerance Patient limited by pain;Patient limited by fatigue;Other (comment) (Incontinence)   Patient Left in bed;with call bell/phone within reach;with bed alarm set   Nurse Communication Mobility status        Time: 8185-6314 OT Time Calculation (min): 28 min  Charges: OT General Charges $OT Visit: 1 Visit OT Treatments $Self Care/Home Management : 8-22 mins  Pollyann Glen E. Mileydi Milsap, COTA/L Acute Rehabilitation Services [phone]   Magally Vahle 08/19/2020, 1:48 PM

## 2020-08-20 DIAGNOSIS — I639 Cerebral infarction, unspecified: Secondary | ICD-10-CM | POA: Diagnosis not present

## 2020-08-20 LAB — GLUCOSE, CAPILLARY
Glucose-Capillary: 78 mg/dL (ref 70–99)
Glucose-Capillary: 164 mg/dL — ABNORMAL HIGH (ref 70–99)

## 2020-08-20 LAB — PROTIME-INR
INR: 2.3 — ABNORMAL HIGH (ref 0.8–1.2)
Prothrombin Time: 24.3 seconds — ABNORMAL HIGH (ref 11.4–15.2)

## 2020-08-20 LAB — CBC
HCT: 26.8 % — ABNORMAL LOW (ref 36.0–46.0)
Hemoglobin: 8.7 g/dL — ABNORMAL LOW (ref 12.0–15.0)
MCH: 30.5 pg (ref 26.0–34.0)
MCHC: 32.5 g/dL (ref 30.0–36.0)
MCV: 94 fL (ref 80.0–100.0)
Platelets: 448 10*3/uL — ABNORMAL HIGH (ref 150–400)
RBC: 2.85 MIL/uL — ABNORMAL LOW (ref 3.87–5.11)
RDW: 18.1 % — ABNORMAL HIGH (ref 11.5–15.5)
WBC: 10.7 10*3/uL — ABNORMAL HIGH (ref 4.0–10.5)
nRBC: 0 % (ref 0.0–0.2)

## 2020-08-20 MED ORDER — WARFARIN SODIUM 2.5 MG PO TABS: 2.5000 mg | ORAL_TABLET | Freq: Every day | ORAL | Status: DC

## 2020-08-20 NOTE — Progress Notes (Signed)
D/C instructions printed and placed in packet at nurse's station for transport.

## 2020-08-20 NOTE — Plan of Care (Signed)
  Problem: Education: Goal: Knowledge of General Education information will improve Description: Including pain rating scale, medication(s)/side effects and non-pharmacologic comfort measures Outcome: Adequate for Discharge   Problem: Health Behavior/Discharge Planning: Goal: Ability to manage health-related needs will improve Outcome: Adequate for Discharge   Problem: Clinical Measurements: Goal: Ability to maintain clinical measurements within normal limits will improve Outcome: Adequate for Discharge Goal: Will remain free from infection Outcome: Adequate for Discharge Goal: Diagnostic test results will improve Outcome: Adequate for Discharge Goal: Respiratory complications will improve Outcome: Adequate for Discharge Goal: Cardiovascular complication will be avoided Outcome: Adequate for Discharge   Problem: Activity: Goal: Risk for activity intolerance will decrease Outcome: Adequate for Discharge   Problem: Nutrition: Goal: Adequate nutrition will be maintained Outcome: Adequate for Discharge   Problem: Coping: Goal: Level of anxiety will decrease Outcome: Adequate for Discharge   Problem: Elimination: Goal: Will not experience complications related to bowel motility Outcome: Adequate for Discharge Goal: Will not experience complications related to urinary retention Outcome: Adequate for Discharge   Problem: Pain Managment: Goal: General experience of comfort will improve Outcome: Adequate for Discharge   Problem: Safety: Goal: Ability to remain free from injury will improve Outcome: Adequate for Discharge   Problem: Skin Integrity: Goal: Risk for impaired skin integrity will decrease Outcome: Adequate for Discharge   Problem: Education: Goal: Ability to demonstrate management of disease process will improve Outcome: Adequate for Discharge Goal: Ability to verbalize understanding of medication therapies will improve Outcome: Adequate for Discharge Goal:  Individualized Educational Video(s) Outcome: Adequate for Discharge   Problem: Activity: Goal: Capacity to carry out activities will improve Outcome: Adequate for Discharge   Problem: Cardiac: Goal: Ability to achieve and maintain adequate cardiopulmonary perfusion will improve Outcome: Adequate for Discharge   Problem: Education: Goal: Knowledge of disease or condition will improve Outcome: Adequate for Discharge Goal: Knowledge of secondary prevention will improve Outcome: Adequate for Discharge Goal: Knowledge of patient specific risk factors addressed and post discharge goals established will improve Outcome: Adequate for Discharge Goal: Individualized Educational Video(s) Outcome: Adequate for Discharge   Problem: Coping: Goal: Will verbalize positive feelings about self Outcome: Adequate for Discharge Goal: Will identify appropriate support needs Outcome: Adequate for Discharge   Problem: Health Behavior/Discharge Planning: Goal: Ability to manage health-related needs will improve Outcome: Adequate for Discharge   Problem: Self-Care: Goal: Ability to participate in self-care as condition permits will improve Outcome: Adequate for Discharge Goal: Verbalization of feelings and concerns over difficulty with self-care will improve Outcome: Adequate for Discharge   Problem: Nutrition: Goal: Risk of aspiration will decrease Outcome: Adequate for Discharge Goal: Dietary intake will improve Outcome: Adequate for Discharge   Problem: Intracerebral Hemorrhage Tissue Perfusion: Goal: Complications of Intracerebral Hemorrhage will be minimized Outcome: Adequate for Discharge   Problem: Ischemic Stroke/TIA Tissue Perfusion: Goal: Complications of ischemic stroke/TIA will be minimized Outcome: Adequate for Discharge   Problem: Spontaneous Subarachnoid Hemorrhage Tissue Perfusion: Goal: Complications of Spontaneous Subarachnoid Hemorrhage will be minimized Outcome:  Adequate for Discharge

## 2020-08-20 NOTE — Progress Notes (Signed)
[facility] PROGRESS NOTE    Progress Note  [name]  [id number] DOB: 08-08-1936 DOA: 08/09/2020 PCP: Nolene Ebbs, MD     Brief Narrative:   Kaylee Mullins is an 85 y.o. 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  Cardiology thought the patient was not fluid overloaded they agree with holding Lasix. Her creatinine is improving nicely. No events overnight patient has been medically stable, awaiting skilled nursing facility placement.  Acute CVA: MRI of the brain showed Numerous scattered small acute infarcts in both cerebral hemispheres, primarily affecting white matter of the MCA territories. Physical therapy evaluated the patient recommended skilled nursing facility. She is awaiting placement to movement house.  Chronic diastolic heart failure: Resume Lasix as an outpatient.  Acute kidney injury: Baseline creatinine of around 1. Likely due to overdiuresis continue to hold Lasix creatinine is improving, resume Lasix as an outpatient.  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  Abdominal ultrasound showed multiple liver metastases. We will wait for renal function to improve to try to perform a CT scan of the abdomen pelvis with contrast.  Diabetes mellitus type 2: Continue long-acting insulin plus sliding scale.  History of DVT: On Coumadin INR supra-therapeutic, notify pharmacy that the INR is elevated.  Elevated troponins: Likely due to demand ischemia.  Hyperkalemia: She was given Kayexalate and IV fluids were potassium has  improved.  Leukocytosis possibly due to urinary tract infection She started empirically on IV Rocephin urine culture grew E. coli more than 100,000 colonies sensitive to Rocephin.  She will complete her treatment in house.   DVT prophylaxis: coumadin Family Communication:none Status is: Inpatient  Remains inpatient appropriate because:Hemodynamically unstable   Dispo: The patient is from: Home              Anticipated d/c is to: SNF              Anticipated d/c date is1 days              Patient currently is not medically stable to d/c.  Awaiting skilled nursing facility placement on 08/19/2020  Code Status:     Code Status Orders  (From admission, onward)         Start     Ordered   08/09/20 1940  Full code  Continuous        08/09/20 1939        Code Status History    Date Active Date Inactive Code Status Order ID Comments User Context   04/12/2019 1728 04/14/2019 2053 Full Code [id number]  Mercy Riding, MD ED   07/30/2017 1847 08/06/2017 1909 Full Code [id number]  Charlie Pitter, PA-C ED   02/06/2017 0324 03/29/2017 2242 Full Code [id number]  Greer Pickerel, MD Inpatient   01/12/2017 1521 02/05/2017 1855 Full Code [id number]  Stark Klein, MD Inpatient   07/15/2016 1524 07/16/2016 2003 Full Code [id number]  Aletta Edouard, MD Inpatient   02/10/2016 2046 02/15/2016 0002 Full Code [id number]  Kristeen Miss, MD Inpatient   09/16/2014 1707 09/18/2014 1713 Full Code [id number]  Jonetta Osgood, MD Inpatient   09/11/2014 1646 09/13/2014 2219  Full Code [id number]  Russella Dar, NP Inpatient   Advance Care Planning Activity    Advance Directive Documentation   Flowsheet Row Most Recent Value  Type of Advance Directive Healthcare Power of Attorney  Pre-existing out of facility DNR order (yellow form or pink MOST form) --  "MOST" Form in Place? --        IV Access:    Peripheral IV   Procedures and diagnostic studies:   No results found.   Medical Consultants:     None.  Anti-Infectives:   none  Subjective:    Kaylee Mullins no new complaints.  Objective:    Vitals:   08/19/20 1632 08/19/20 2213 08/20/20 0108 08/20/20 0656  BP: (!) 155/92 (!) 158/58 (!) 143/73 131/67  Pulse: (!) 55 (!) 56 (!) 57 (!) 55  Resp: 20 18 18 19   Temp: (!) 97.2 F (36.2 C) 98.9 F (37.2 C) 98.7 F (37.1 C) 98 F (36.7 C)  TempSrc: Oral Oral Oral   SpO2: 99% 100% 100% 100%  Weight:      Height:       SpO2: 100 % O2 Flow Rate (L/min): 3 L/min FiO2 (%): 100 %   Intake/Output Summary (Last 24 hours) at 08/20/2020 0949 Last data filed at 08/20/2020 0650 Gross per 24 hour  Intake 840 ml  Output 1000 ml  Net -160 ml   Filed Weights   08/17/20 0356 08/18/20 0400 08/19/20 0532  Weight: 85.6 kg 84.2 kg 88.4 kg    Exam: General exam: In no acute distress. Respiratory system: Good air movement and clear to auscultation. Cardiovascular system: S1 & S2 heard, RRR. No JVD. Gastrointestinal system: Abdomen is nondistended, soft and nontender.  Extremities: No pedal edema. Skin: No rashes, lesions or ulcers Psychiatry: Judgement and insight appear normal. Mood & affect appropriate.  Data Reviewed:    Labs: Basic Metabolic Panel: Recent Labs  Lab 08/13/20 1611 08/14/20 0357 08/15/20 0249 08/16/20 0254 08/17/20 0511  NA 138 144 139 140 142  K 4.3 4.3 4.1 3.8 4.1  CL 102 107 104 107 109  CO2 26 26 25 27 23   GLUCOSE 132* 96 202* 131* 162*  BUN 46* 48* 49* 40* 36*  CREATININE 2.22* 2.24* 1.99* 1.64* 1.46*  CALCIUM 7.3* 7.5* 7.2* 7.3* 7.4*   GFR Estimated Creatinine Clearance: 32 mL/min (A) (by C-G formula based on SCr of 1.46 mg/dL (H)). Liver Function Tests: No results for input(s): AST, ALT, ALKPHOS, BILITOT, PROT, ALBUMIN in the last 168 hours. No results for input(s): LIPASE, AMYLASE in the last 168 hours. Recent Labs  Lab 08/13/20 2014  AMMONIA 27   Coagulation profile Recent Labs  Lab 08/16/20 0254 08/17/20 0511  08/18/20 0307 08/19/20 0346 08/20/20 0441  INR 2.9* 3.1* 3.5* 2.9* 2.3*   COVID-19 Labs  No results for input(s): DDIMER, FERRITIN, LDH, CRP in the last 72 hours.  Lab Results  Component Value Date   SARSCOV2NAA NEGATIVE 08/19/2020   SARSCOV2NAA NEGATIVE 08/09/2020    CBC: Recent Labs  Lab 08/14/20 0357 08/17/20 0511 08/20/20 0441  WBC 22.8* 13.9* 10.7*  NEUTROABS 18.0*  --   --   HGB 9.6* 9.4* 8.7*  HCT 29.2* 30.8* 26.8*  MCV 93.9 97.2 94.0  PLT 339 485* 448*   Cardiac Enzymes: No results for input(s): CKTOTAL, CKMB, CKMBINDEX, TROPONINI in the last 168 hours. BNP (last 3 results) No results for input(s): PROBNP in the last 8760 hours. CBG: Recent Labs  Lab 08/19/20 [phone] 08/19/20  1102 08/19/20 1613 08/19/20 2211 08/20/20 0659  GLUCAP 128* 154* 118* 118* 78   D-Dimer: No results for input(s): DDIMER in the last 72 hours. Hgb A1c: No results for input(s): HGBA1C in the last 72 hours. Lipid Profile: No results for input(s): CHOL, HDL, LDLCALC, TRIG, CHOLHDL, LDLDIRECT in the last 72 hours. Thyroid function studies: No results for input(s): TSH, T4TOTAL, T3FREE, THYROIDAB in the last 72 hours.  Invalid input(s): FREET3 Anemia work up: No results for input(s): VITAMINB12, FOLATE, FERRITIN, TIBC, IRON, RETICCTPCT in the last 72 hours. Sepsis Labs: Recent Labs  Lab 08/14/20 0357 08/17/20 0511 08/20/20 0441  WBC 22.8* 13.9* 10.7*   Microbiology Recent Results (from the past 240 hour(s))  Culture, Urine     Status: Abnormal   Collection Time: 08/14/20  2:15 AM   Specimen: Urine, Random  Result Value Ref Range Status   Specimen Description URINE, RANDOM  Final   Special Requests   Final    NONE Performed at [facility], [address], [place], [place] [address]    Culture >=100,000 COLONIES/mL ESCHERICHIA COLI (A)  Final   Report Status 08/16/2020 FINAL  Final   Organism ID, Bacteria ESCHERICHIA COLI (A)  Final      Susceptibility    Escherichia coli - MIC*    AMPICILLIN 8 SENSITIVE Sensitive     CEFAZOLIN <=4 SENSITIVE Sensitive     CEFEPIME <=0.12 SENSITIVE Sensitive     CEFTRIAXONE <=0.25 SENSITIVE Sensitive     CIPROFLOXACIN <=0.25 SENSITIVE Sensitive     GENTAMICIN <=1 SENSITIVE Sensitive     IMIPENEM <=0.25 SENSITIVE Sensitive     NITROFURANTOIN 32 SENSITIVE Sensitive     TRIMETH/SULFA <=20 SENSITIVE Sensitive     AMPICILLIN/SULBACTAM 4 SENSITIVE Sensitive     PIP/TAZO <=4 SENSITIVE Sensitive     * >=100,000 COLONIES/mL ESCHERICHIA COLI  SARS CORONAVIRUS 2 (TAT 6-24 HRS) Nasopharyngeal Nasopharyngeal Swab     Status: None   Collection Time: 08/19/20 11:47 AM   Specimen: Nasopharyngeal Swab  Result Value Ref Range Status   SARS Coronavirus 2 NEGATIVE NEGATIVE Final    Comment: (NOTE) SARS-CoV-2 target nucleic acids are NOT DETECTED.  The SARS-CoV-2 RNA is generally detectable in upper and lower respiratory specimens during the acute phase of infection. Negative results do not preclude SARS-CoV-2 infection, do not rule out co-infections with other pathogens, and should not be used as the sole basis for treatment or other patient management decisions. Negative results must be combined with clinical observations, patient history, and epidemiological information. The expected result is Negative.  Fact Sheet for Patients: SugarRoll.be  Fact Sheet for Healthcare Providers: https://www.woods-mathews.com/  This test is not yet approved or cleared by the [place] FDA and  has been authorized for detection and/or diagnosis of SARS-CoV-2 by FDA under an Emergency Use Authorization (EUA). This EUA will remain  in effect (meaning this test can be used) for the duration of the COVID-19 declaration under Se ction 564(b)(1) of the Act, 21 U.S.C. section 360bbb-3(b)(1), unless the authorization is terminated or revoked sooner.  Performed at [facility], [place] [address] [place], [place] [address]      Medications:   . carvedilol  12.5 mg Oral BID WC  . Chlorhexidine Gluconate Cloth  6 each Topical Daily  . docusate sodium  100 mg Oral Daily  . famotidine  20 mg Oral Daily  . feeding supplement (GLUCERNA SHAKE)  237 mL Oral TID BM  . furosemide  40 mg Oral Daily  . insulin aspart  0-5 Units Subcutaneous QHS  . insulin aspart  0-9 Units Subcutaneous TID WC  . insulin aspart  3 Units Subcutaneous TID WC  . insulin detemir  7 Units Subcutaneous BID  . levothyroxine  200 mcg Oral QAC breakfast  . melatonin  5 mg Oral QHS  . multivitamin with minerals  1 tablet Oral Daily  . pantoprazole  40 mg Oral QHS  . polyethylene glycol  17 g Oral BID  . sodium chloride flush  3 mL Intravenous Q12H  . warfarin  2.5 mg Oral q1600  . Warfarin - Pharmacist Dosing Inpatient   Does not apply q1600   Continuous Infusions: . sodium chloride        LOS: 10 days   [facility]  08/20/2020, 9:49 AM

## 2020-08-20 NOTE — TOC Transition Note (Signed)
Transition of Care [facility]) - CM/SW Discharge Note   Patient Details  Name: Kaylee Mullins MRN: [id number] Date of Birth: Sep 13, 1935  Transition of [facility]) CM/SW Contact:  Erin Sons, LCSW Phone Number: 08/20/2020, 2:25 PM   Clinical Narrative:    Patient will DC to: [facility] Anticipated DC date: 08/20/20 Family notified: Soto,Sheila (Daughter)  [phone] (Mobile) Transport by: Sharin Mons   Per MD patient ready for DC to . RN, patient, patient's family, and facility notified of DC. Discharge Summary and FL2 sent to facility. RN to call report prior to discharge [phone] Room [phone]). DC packet on chart. Ambulance transport requested for patient.   CSW will sign off for now as social work intervention is no longer needed. Please consult [place] again if new needs arise.    Final next level of care: Skilled Nursing Facility Barriers to Discharge: No Barriers Identified   Patient Goals and CMS Choice Patient states their goals for this hospitalization and ongoing recovery are:: [facility] CMS Medicare.gov Compare Post Acute Care list provided to:: Patient Choice offered to / list presented to : Patient  Discharge Placement              Patient chooses bed at: [facility] Patient to be transferred to facility by: [facility] Name of family member notified: Soto,Sheila (Daughter)   [phone] (Mobile) Patient and family notified of of transfer: 08/20/20  Discharge Plan and Services                                     Social Determinants of Health (SDOH) Interventions     Readmission Risk Interventions No flowsheet data found.

## 2020-08-20 NOTE — Progress Notes (Signed)
Nutrition Follow-up  DOCUMENTATION CODES:   Obesity unspecified  INTERVENTION:   -Continue Glucerna Shake po TID, each supplement provides 220 kcal and 10 grams of protein -Continue Magic cup TID with meals, each supplement provides 290 kcal and 9 grams of protein -Continue MVI with minerals daily  NUTRITION DIAGNOSIS:   Increased nutrient needs related to cancer and cancer related treatments as evidenced by estimated needs.  Ongoing  GOAL:   Patient will meet greater than or equal to 90% of their needs  Progressing   MONITOR:   PO intake,Supplement acceptance,Weight trends,Labs,I & O's  REASON FOR ASSESSMENT:   Malnutrition Screening Tool    ASSESSMENT:   Pt admitted with dyspnea due to acute on chronic respiratory failure. 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  Reviewed I/O's: -160 ml x 24 hours and +4.3 L since admission  UOP: 1 L x 24 hours  Pt unavailable at time of attempted contact.   Pt with good appetite. Noted meal completion 50-75%. Pt is consuming Glucerna supplements. Per MD notes, pt continues to complain of abdominal pain.  Medications reviewed and include lasix, miralax, and melatonin.  Pt is medically stable for discharge and awaiting SNF bed.   Labs reviewed: CBGS: 78-118 (inpatient orders for glycemic control are 0-5 units insulin aspart TID with meals, 3 units insulin aspart TID with meals, and 7 units inuslin detemir BID).   Diet Order:   Diet Order            Diet - low sodium heart healthy           Diet heart healthy/carb modified Room service appropriate? Yes; Fluid consistency: Thin  Diet effective now                 EDUCATION NEEDS:   No education needs have been identified at this time  Skin:  Skin Assessment: Reviewed RN Assessment  Last BM:  08/18/20  Height:   Ht Readings from Last 1  Encounters:  08/09/20 5\' 4"  (1.626 m)    Weight:   Wt Readings from Last 1 Encounters:  08/19/20 88.4 kg    Ideal Body Weight:  54.5 kg  BMI:  Body mass index is 33.45 kg/m.  Estimated Nutritional Needs:   Kcal:  1700-1900  Protein:  85-100 grams  Fluid:  >1.7L/d    10/17/20, RD, LDN, CDCES Registered Dietitian II Certified Diabetes Care and Education Specialist Please refer to AMION for RD and/or RD on-call/weekend/after hours pager

## 2020-08-21 ENCOUNTER — Encounter: Payer: Self-pay | Admitting: *Deleted

## 2020-08-21 NOTE — Progress Notes (Unsigned)
Patient ID: Kaylee Mullins, female   DOB: 1936/03/29, 85 y.o.   MRN: [id number] Patient enrolled for [employer] to ship a 30 day cardiac event monitor to [facility] c/o Annabell Sabal, [address], [address], [place] [facility] [place] [place]  [address] Attn:  Darlina Guys, [phone].   Letter with cardiac event monitor instructions mailed to same address.

## 2020-08-23 ENCOUNTER — Ambulatory Visit (INDEPENDENT_AMBULATORY_CARE_PROVIDER_SITE_OTHER): Payer: Medicare Other

## 2020-08-23 DIAGNOSIS — I4891 Unspecified atrial fibrillation: Secondary | ICD-10-CM

## 2020-08-23 DIAGNOSIS — I639 Cerebral infarction, unspecified: Secondary | ICD-10-CM

## 2020-08-26 ENCOUNTER — Telehealth: Payer: Self-pay | Admitting: *Deleted

## 2020-08-26 NOTE — Telephone Encounter (Signed)
Asking about her appointments this week. Is in rehab at [facility] and asking if she need to have CT scan this week. Per Dr. Benay Spice: May cancel CT scan and come on 1/14 for lab/flush and office visit. Freda Munro will make facility aware of the appointment to have her here at 1130 on 08/30/20.

## 2020-08-28 ENCOUNTER — Other Ambulatory Visit: Payer: Medicare Other

## 2020-08-28 ENCOUNTER — Ambulatory Visit (HOSPITAL_COMMUNITY): Payer: Medicare Other

## 2020-08-28 ENCOUNTER — Inpatient Hospital Stay: Payer: Medicare Other

## 2020-08-30 ENCOUNTER — Other Ambulatory Visit: Payer: Medicare Other

## 2020-08-30 ENCOUNTER — Other Ambulatory Visit: Payer: Self-pay

## 2020-08-30 ENCOUNTER — Inpatient Hospital Stay: Payer: Medicare Other | Attending: Internal Medicine | Admitting: Nurse Practitioner

## 2020-08-30 ENCOUNTER — Inpatient Hospital Stay: Payer: Medicare Other

## 2020-08-30 VITALS — BP 109/60 | HR 61 | Temp 97.6°F | Resp 16 | Ht 64.0 in

## 2020-08-30 DIAGNOSIS — Z5111 Encounter for antineoplastic chemotherapy: Secondary | ICD-10-CM | POA: Diagnosis not present

## 2020-08-30 DIAGNOSIS — Z85850 Personal history of malignant neoplasm of thyroid: Secondary | ICD-10-CM | POA: Diagnosis not present

## 2020-08-30 DIAGNOSIS — C801 Malignant (primary) neoplasm, unspecified: Secondary | ICD-10-CM

## 2020-08-30 DIAGNOSIS — C787 Secondary malignant neoplasm of liver and intrahepatic bile duct: Secondary | ICD-10-CM | POA: Insufficient documentation

## 2020-08-30 DIAGNOSIS — K5900 Constipation, unspecified: Secondary | ICD-10-CM | POA: Insufficient documentation

## 2020-08-30 DIAGNOSIS — I10 Essential (primary) hypertension: Secondary | ICD-10-CM | POA: Insufficient documentation

## 2020-08-30 DIAGNOSIS — Z87440 Personal history of urinary (tract) infections: Secondary | ICD-10-CM | POA: Diagnosis not present

## 2020-08-30 DIAGNOSIS — C241 Malignant neoplasm of ampulla of Vater: Secondary | ICD-10-CM

## 2020-08-30 DIAGNOSIS — R600 Localized edema: Secondary | ICD-10-CM | POA: Insufficient documentation

## 2020-08-30 DIAGNOSIS — Z86718 Personal history of other venous thrombosis and embolism: Secondary | ICD-10-CM | POA: Insufficient documentation

## 2020-08-30 DIAGNOSIS — Z7901 Long term (current) use of anticoagulants: Secondary | ICD-10-CM | POA: Insufficient documentation

## 2020-08-30 DIAGNOSIS — Z85528 Personal history of other malignant neoplasm of kidney: Secondary | ICD-10-CM | POA: Diagnosis not present

## 2020-08-30 DIAGNOSIS — E1140 Type 2 diabetes mellitus with diabetic neuropathy, unspecified: Secondary | ICD-10-CM | POA: Diagnosis not present

## 2020-08-30 DIAGNOSIS — Z95828 Presence of other vascular implants and grafts: Secondary | ICD-10-CM

## 2020-08-30 LAB — CMP (CANCER CENTER ONLY)
ALT: 28 U/L (ref 0–44)
AST: 39 U/L (ref 15–41)
Albumin: 2 g/dL — ABNORMAL LOW (ref 3.5–5.0)
Alkaline Phosphatase: 260 U/L — ABNORMAL HIGH (ref 38–126)
Anion gap: 8 (ref 5–15)
BUN: 29 mg/dL — ABNORMAL HIGH (ref 8–23)
CO2: 30 mmol/L (ref 22–32)
Calcium: 7.7 mg/dL — ABNORMAL LOW (ref 8.9–10.3)
Chloride: 102 mmol/L (ref 98–111)
Creatinine: 1.1 mg/dL — ABNORMAL HIGH (ref 0.44–1.00)
GFR, Estimated: 50 mL/min — ABNORMAL LOW (ref >60)
Glucose, Bld: 206 mg/dL — ABNORMAL HIGH (ref 70–99)
Potassium: 4 mmol/L (ref 3.5–5.1)
Sodium: 140 mmol/L (ref 135–145)
Total Bilirubin: 0.5 mg/dL (ref 0.3–1.2)
Total Protein: 6.8 g/dL (ref 6.5–8.1)

## 2020-08-30 LAB — CBC WITH DIFFERENTIAL (CANCER CENTER ONLY)
Abs Immature Granulocytes: 0.04 10*3/uL (ref 0.00–0.07)
Basophils Absolute: 0 10*3/uL (ref 0.0–0.1)
Basophils Relative: 0 %
Eosinophils Absolute: 0.1 10*3/uL (ref 0.0–0.5)
Eosinophils Relative: 1 %
HCT: 29.8 % — ABNORMAL LOW (ref 36.0–46.0)
Hemoglobin: 9.3 g/dL — ABNORMAL LOW (ref 12.0–15.0)
Immature Granulocytes: 1 %
Lymphocytes Relative: 21 %
Lymphs Abs: 1.9 10*3/uL (ref 0.7–4.0)
MCH: 29.7 pg (ref 26.0–34.0)
MCHC: 31.2 g/dL (ref 30.0–36.0)
MCV: 95.2 fL (ref 80.0–100.0)
Monocytes Absolute: 0.5 10*3/uL (ref 0.1–1.0)
Monocytes Relative: 6 %
Neutro Abs: 6.3 10*3/uL (ref 1.7–7.7)
Neutrophils Relative %: 71 %
Platelet Count: 286 10*3/uL (ref 150–400)
RBC: 3.13 MIL/uL — ABNORMAL LOW (ref 3.87–5.11)
RDW: 17.2 % — ABNORMAL HIGH (ref 11.5–15.5)
WBC Count: 8.9 10*3/uL (ref 4.0–10.5)
nRBC: 0 % (ref 0.0–0.2)

## 2020-08-30 MED ORDER — SODIUM CHLORIDE 0.9% FLUSH
10.0000 mL | Freq: Once | INTRAVENOUS | Status: AC
  Administered 2020-08-30: 10 mL
  Filled 2020-08-30: qty 10

## 2020-08-30 MED ORDER — HEPARIN SOD (PORK) LOCK FLUSH 100 UNIT/ML IV SOLN
500.0000 [IU] | Freq: Once | INTRAVENOUS | Status: AC
  Administered 2020-08-30: 500 [IU] via INTRAVENOUS
  Filled 2020-08-30: qty 5

## 2020-08-30 MED ORDER — SODIUM CHLORIDE 0.9% FLUSH
10.0000 mL | INTRAVENOUS | Status: DC | PRN
  Administered 2020-08-30: 10 mL via INTRAVENOUS
  Filled 2020-08-30: qty 10

## 2020-08-30 NOTE — Patient Instructions (Signed)

## 2020-08-30 NOTE — Progress Notes (Addendum)
[place] OFFICE PROGRESS NOTE   Diagnosis: Ampullary carcinoma  INTERVAL HISTORY:   Kaylee Mullins returns for follow-up.  She completed a cycle of gemcitabine/Abraxane 08/02/2020.  She was hospitalized with a urinary tract infection, renal failure and CVAs 08/09/2020 through 08/20/2020.  She was discharged to a nursing facility.  She is constipated.  She has started a laxative.  Her daughter reports the plan is for a disimpaction.  No nausea or vomiting.  No abdominal pain.  Appetite is poor.  Objective:  Vital signs in last 24 hours:  Blood pressure 109/60, pulse 61, temperature 97.6 F (36.4 C), temperature source Tympanic, resp. rate 16, height 5\' 4"  (1.626 m), SpO2 100 %.    HEENT: White coating over tongue.  No buccal thrush. Resp: Lungs clear bilaterally. Cardio: Regular rate and rhythm. GI: Abdomen soft and nontender. Vascular: Trace edema at the lower legs bilaterally. Neuro: Moves all extremities.  Follows commands.  Intermittently has eyes closed during exam.   Port-A-Cath without erythema.  Lab Results:  Lab Results  Component Value Date   WBC 8.9 08/30/2020   HGB 9.3 (L) 08/30/2020   HCT 29.8 (L) 08/30/2020   MCV 95.2 08/30/2020   PLT 286 08/30/2020   NEUTROABS 6.3 08/30/2020    Imaging:  No results found.  Medications: I have reviewed the patient's current medications.  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 CT abdomen/pelvis 10/23/2016-ampullary mass, single mildly enlarged porta hepatis lymph node, no evidence of distant metastatic disease ? Status post pancreaticoduodenectomy 01/12/2017; pT3pN2 ? CT abdomen/pelvis 03/30/2018-ablation defect within the upper pole of the right kidney. No evidence of recurrent renal mass. New mild left periaortic retroperitoneal lymphadenopathy. ? CT abdomen/pelvis 06/30/2018-6 mm right middle lobe  nodule-slowly growing over multiple CTs, stable ablation defect in the right kidney, 1.9 cm left periaortic node increased from 1.2 cm, stable 1.5 cm porta hepatis node ? CT abdomen/pelvis 09/17/2018-surgical changes related to Whipple procedure. Stable upper abdominal/retroperitoneal lymphadenopathy. 5 mm right middle lobe nodule, stable from most recent CT but mildly progressive from priors. Postprocedural changes related to renal ablation in the medial right upper kidney. ? CTs 03/24/2019-a few scattered pulmonary nodules appears stable; liver has a slightly shrunken appearance and nodular appearance compatible with mild cirrhosis. No suspicious cystic or solid hepatic lesions. Upper abdominal and retroperitoneal lymphadenopathy appears similar compared to the prior study. No other new lymphadenopathy noted elsewhere in the abdomen or pelvis. ? PET scan 07/03/2019-hypermetabolic lymph node adjacent porta hepatis. Newfocal hypermetabolic lesion in the liver. Hypermetabolic activity in the left suprarenal location favored metastatic adenopathy in the retroperitoneum. Diffuse hypermetabolic activity within the stomach favor gastritis. ? Cycle 1 gemcitabine/Abraxane 08/31/2019 ? Cycle 2 gemcitabine/Abraxane 09/14/2019 ? Cycle 3 gemcitabine/Abraxane 09/29/2019 ? Cycle 4 gemcitabine/Abraxane 10/13/2019 ? Cycle 5 gemcitabine/Abraxane 10/27/2019 ? Cycle 6 gemcitabine/Abraxane 11/09/2019 ? CTs 11/13/2019-new 2 mm right apical and 3 mm left lower lobe nodules, stable portacaval node, enlargement of amorphous soft tissue encasing the left renal artery, stable liver lesion ? Cycle 7 gemcitabine/Abraxane 11/24/2019 ? Cycle 8 gemcitabine/Abraxane 12/07/2019 ? 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 Cycle 10 gemcitabine/Abraxane 01/05/2020 ? Cycle 11 gemcitabine/Abraxane 01/18/2020 ? CTs 01/30/2020-small right pleural effusion. Rounded peripheral mass along the margin of the right lower  lobe posteriorly adjacent to the pleural effusion 3.0 x 1.9 cm compared to previous measurement of 3.0 x 1.9 cm. Stable 4 mm right upper  lobe nodule. Stable 0.6 x 0.5 cm right middle lobe nodule. Stable 3 mm nodule in the superior segment left lower lobe. No new nodules identified. Reduced conspicuity of margins of the moderately heterogeneous anterior liver lesion measuring 5.2 x 2.5 x 4.4 cm, previous measurement 3.0 x 2.4 x 3.4 cm. Nodular contour of the liver. Region of hypodensity in segment 5 measuring 2.6 x 1.8 cm, previously 2.0 x 1.3 cm. Portacaval node 1.2 cm, previously 1.3 cm. Porta hepatis node 0.9 cm, formerly 1.3 cm. Abnormal indistinctly marginated soft tissue density probably reflecting nodal tissue surrounding the left proximal renal artery measuring 1.8 cm, formerly 1.9 cm. ? Cycle 12 gemcitabine/Abraxane 02/01/2020 ? Cycle13gemcitabine/Abraxane 02/16/2020 ? Cycle 14 gemcitabine/Abraxane 02/29/2020 ? 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 Cycle 16 gemcitabine 04/05/2020 (Abraxane held due to neuropathy) ? Cycle 17 gemcitabine 04/18/2020 (Abraxane held due to neuropathy) ? CTs 04/30/2020-previously noted pleural-based masslike lesion in the posterior aspect of the right lower lobe with slow increase in size over numerous prior examinations. Slight regression of multiple liver metastases. No new liver lesions noted. Stable left para-aortic lymphadenopathy. ? Continue every 2-week gemcitabine ? Cycle 18 gemcitabine 05/02/2020 (Abraxane held due to neuropathy) ? Cycle 19gemcitabine 05/16/2020 ? Cycle 20 gemcitabine/Abraxane 05/30/2020 ? Cycle 21 gemcitabine/Abraxane 06/14/2020 ? Cycle 22 gemcitabine/Abraxane 06/27/2020 ? Cycle 23 gemcitabine/Abraxane 07/18/2020 ? Cycle 24 gemcitabine/Abraxane 08/02/2020 2. Biliary obstruction secondary to #1, status post placement of a metal, bile duct stent on 10/23/2016  3. 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 Renal cell carcinoma-status post ablation of a right renal mass 07/15/2016, biopsy confirmed papillary renal cell carcinoma, Fuhrman grade 3  5. CVA in 2016  6. History of thyroid cancer-status post thyroidectomy and radioactive iodine in 2005  7. Diabetes  8.Hypertension  9.Left lower extremity deep vein thrombosis June 2019, IVC filter placed July 2018 after she was diagnosed with a rectus hematoma while on Lovenox  10. Hospitalized 04/12/2019 through 04/14/2019 with traumatic hematoma right lower leg, followed at the wound clinic  11.Port-A-Cath placement interventional radiology 08/29/2019  12.Covid vaccine-second injection administered 11/10/2019; booster 04/05/2020  13.Resolving right buttock rash 08/02/2020-likely a zoster rash  14.  Admission 08/09/2020 with dyspnea  15.  Acute renal failure during admission December 2021-improved  16.  Altered mental status-etiology unclear, improved 08/14/2020  17.  Urinary tract infection 08/13/2020-E. coli, treated with ceftriaxone  18.  Multiple acute infarcts noted on brain MRI 08/14/2020, likely watershed infarcts from hypotension   Disposition: Kaylee Mullins has metastatic ampullary carcinoma most recently on active treatment with gemcitabine/Abraxane.  She was hospitalized last month with UTI, renal failure, CVAs.  She was discharged to a nursing facility.  She is accompanied to today's visit by her daughter.  They understand Kaylee Mullins is not a candidate to continue chemotherapy in her current condition.  Dr. Benay Spice recommends a hospice referral.  They are in agreement.  Dr. Benay Spice discussed end-of-life issues/CODE STATUS with Kaylee Mullins.  She would like to remain on FULL CODE STATUS.  We will schedule a return visit in 3 to 4 weeks.  Patient seen with Dr. Benay Spice.    [name] ANP/GNP-BC   08/30/2020  12:44 PM  Kaylee Mullins was interviewed and  examined.  She is here today with her daughter.  She has been in a skilled nursing facility since discharge from the hospital on 08/20/2020.  She was diagnosed with multiple CVAs and a urinary tract infection while in the hospital.  Her  performance status remains poor.  I suspect this is related to progression of the ampullary carcinoma.  She does not appear to be a candidate for further chemotherapy.  I do not recommend a restaging CT.  Kaylee Mullins plans to be discharged to her daughter's home next week.  She agrees to home hospice care.  [name], MD

## 2020-08-31 LAB — CANCER ANTIGEN 19-9: CA 19-9: 9622 U/mL — ABNORMAL HIGH (ref 0–35)

## 2020-09-02 ENCOUNTER — Telehealth: Payer: Self-pay | Admitting: Nurse Practitioner

## 2020-09-02 NOTE — Telephone Encounter (Signed)
Scheduled appointments per 1/14 los. Spoke to patient's daughter who is aware of appointments date and times.

## 2020-09-04 ENCOUNTER — Ambulatory Visit: Payer: Medicare Other | Admitting: Physician Assistant

## 2020-09-04 ENCOUNTER — Other Ambulatory Visit: Payer: Self-pay

## 2020-09-09 ENCOUNTER — Telehealth: Payer: Self-pay | Admitting: Physician Assistant

## 2020-09-09 ENCOUNTER — Other Ambulatory Visit: Payer: Self-pay | Admitting: Physician Assistant

## 2020-09-09 DIAGNOSIS — I4891 Unspecified atrial fibrillation: Secondary | ICD-10-CM

## 2020-09-09 DIAGNOSIS — I639 Cerebral infarction, unspecified: Secondary | ICD-10-CM

## 2020-09-09 NOTE — Telephone Encounter (Signed)
New Message:    Please call Kaylee Mullins, pt is wearing a device she have never seen before,  She says it  is not a pacemaker and she does not think it is a Monitor.

## 2020-09-09 NOTE — Progress Notes (Signed)
Virtual Visit via Video Note   This visit type was conducted due to national recommendations for restrictions regarding the COVID-19 Pandemic (e.g. social distancing) in an effort to limit this patient's exposure and mitigate transmission in our community.  Due to her co-morbid illnesses, this patient is at least at moderate risk for complications without adequate follow up.  This format is felt to be most appropriate for this patient at this time.  All issues noted in this document were discussed and addressed.  A limited physical exam was performed with this format.  Please refer to the patient's chart for her consent to telehealth for Kaylee Mullins.       Date:  09/10/2020   ID:  Kaylee Mullins, DOB 1936-03-25, MRN [id number] The patient was identified using 2 identifiers.  Patient Location: Home Provider Location: Home Office  PCP:  Kaylee Ebbs, MD  Cardiologist:  Kaylee Carnes, MD  Electrophysiologist:  None   Evaluation Performed:  Follow-Up Visit  Chief Complaint:  Hospitalization Follow-up (UTI, failure to thrive, stroke)    Patient Profile: Kaylee Mullins is a 85 y.o. 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 C/b SVT, DVT, rectus sheath hematoma >> anticoag DCd, s/p IVC filter ? On Gemcitabine (Kaylee. Benay Mullins)  Hx of recurrent DVT (extensive bilateral) in 07/2017 >> Warfarin  Prior CV studies: Carotid US 08/15/20 Bilat ICA 1-39   Echocardiogram 08/09/20 Mild septal HK, EF 50-55, no RWMA, Gr 2 DD, normal RVSF, mild LAE, trivial MR, AV calcified, mild AI  Echo 06/28/17 Mild concentric LVH, EF 60-65, normal wall motion, grade 1 diastolic dysfunction, mild AI,  mild LAE, normal RVSF  Nuclear stress test 6/17 Inferior/inferolateral infarct with mild peri-infarct ischemia versus diaphragmatic attenuation, no significant ischemia, EF 56, low risk  Echo 6/17 Mild concentric LVH, EF 55-60, normal wall motion, grade 2 diastolic dysfunction, trivial AI  Carotid US 1/16 Bilateral ICA 1-39   History of Present Illness:   [name] was last seen in 9/21.  She was admitted 12/24-1/4 with a chief complaint of shortness of breath.  Her BNP was mildly elevated but she did not appear volume overloaded and she had significant bump in her creatinine with IV furosemide.  She was noted to have episodes of ATach on tele and beta-blocker was continued.  She had minimal elevation in hs-Trop felt to represent demand ischemia.  She was followed by Cardiology and is not felt to be a candidate for ischemic evaluation given advanced age, chronic kidney disease and metastatic CA.  Med Rx was continued.  She was treated for a UTI.  She was noted to have embolic infarcts on brain MRI and neurology recommended continued warfarin Rx, ASA.  Reviewed notes from Kaylee Mullins who notes infarcts were possibly related to sepsis and low BP.  A cardiac source was felt to be unlikely for her stroke.  However, an OP event monitor was recommended to assess for atrial fibrillation.    She is seen for f/u.  Her daughter Freda Mullins joins the video call as well.  The patient has been weak since she was hospitalized.  She did go to Kaylee Mullins for a short  period of time.  Physical therapy was not successful in getting her up out of bed.  Her daughter has been working with her since she got home and finally was able to get her up into a wheelchair.  She feels like her strength is minimally improved.  She has not had orthopnea, chest pain, syncope.  She has not had rapid palpitations.  She has shortness of breath with some activities.  She has chronic leg edema without significant change.   Past Medical  History:  Diagnosis Date  . Acute CVA (cerebrovascular accident) ([place]) 09/16/2014  . Acute DVT of left tibial vein ([place]) 04/03/2017  . Acute on chronic diastolic CHF (congestive heart failure) ([place]) 08/10/2020  . Adenocarcinoma ([place]) 01/12/2017  . 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 Anxiety   . Arthritis    knees  . Black tarry stools    05-14-16 negative for occult blood with ER visit- noted in [place].  . Cancer [facility])    thyroid cancer- surgery and radiation  . Cerebral infarction due to unspecified mechanism   . Chronic kidney disease    questionable mass on kidney. Being followed by Kaylee Mullins  . Complication of anesthesia    heart rate was really low  . CVA (cerebral infarction) 09/11/2014  . Depression   . Diabetes mellitus    type 2  . Diabetes mellitus without complication ([facility]) [id number]   Qualifier: Diagnosis of  By: Kaylee Drilling MD, Kaylee Mullins   . Dyslipidemia 04/20/2007   Qualifier: Diagnosis of  By: Kaylee Drilling MD, Kaylee Mullins   . Full dentures   . Gastroparesis 02/06/2017  . GERD (gastroesophageal reflux disease) 04/03/2017  . History of CVA (cerebral vascular accident) ([place]) 09/11/2014  . Hypertension   . Hypokalemia 08/25/2017  . Hypothyroidism   . Lumbar stenosis with neurogenic claudication 02/10/2016  . Osteoarthritis 09/11/2014  . Pneumonia   . Right renal mass 09/13/2014  . Spinal stenosis   . Stroke ([place]) 09/2014   left sided weakness  . SVT (supraventricular tachycardia) ([place]) 07/30/2017   Past Surgical History:  Procedure Laterality Date  . ABDOMINAL HYSTERECTOMY     partial  . cataracts     Removed  11/2015  bilateral  . COLONOSCOPY W/ POLYPECTOMY    . ERCP N/A 05/07/2016   Procedure: ENDOSCOPIC RETROGRADE CHOLANGIOPANCREATOGRAPHY (ERCP);  Surgeon: Kaylee Essex, MD;  Location: [facility] ENDOSCOPY;  Service: Endoscopy;  Laterality: N/A;  . ERCP N/A 10/16/2016   Procedure: ENDOSCOPIC RETROGRADE CHOLANGIOPANCREATOGRAPHY (ERCP);  Surgeon: Kaylee Essex, MD;  Location: [facility] ENDOSCOPY;  Service:  Endoscopy;  Laterality: N/A;  . ESOPHAGOGASTRODUODENOSCOPY N/A 02/10/2017   Procedure: ESOPHAGOGASTRODUODENOSCOPY (EGD);  Surgeon: Teena Irani, MD;  Location[facility] ENDOSCOPY;  Service: Endoscopy;  Laterality: N/A;  . EUS N/A 05/27/2016   Procedure: ESOPHAGEAL ENDOSCOPIC ULTRASOUND (EUS) RADIAL;  Surgeon: Arta Silence, MD;  Location: [facility] ENDOSCOPY;  Service: Endoscopy;  Laterality: N/A;  . FLEXIBLE SIGMOIDOSCOPY  03/29/2012   Procedure: FLEXIBLE SIGMOIDOSCOPY;  Surgeon: Jeryl Columbia, MD;  Location: [facility] ENDOSCOPY;  Service: Endoscopy;  Laterality: N/A;  fleet enema upon arrival  . HOT HEMOSTASIS  03/29/2012   Procedure: HOT HEMOSTASIS (ARGON PLASMA COAGULATION/BICAP);  Surgeon: Jeryl Columbia, MD;  Location: [facility] ENDOSCOPY;  Service: Endoscopy;  Laterality: N/A;  . IR GASTR TUBE CONVERT GASTR-JEJ PER W/FL MOD SED  02/23/2017  . IR GASTROSTOMY TUBE MOD SED  02/16/2017  . IR GENERIC HISTORICAL  06/30/2016   IR RADIOLOGIST EVAL & MGMT 06/30/2016 Aletta Edouard, MD [facility] INTERV RAD  .  IR GENERIC HISTORICAL  09/09/2016   IR RADIOLOGIST EVAL & MGMT 09/09/2016 Aletta Edouard, MD [facility] INTERV RAD  . IR IMAGING GUIDED PORT INSERTION  08/29/2019  . IR IVC FILTER PLMT / S&I /IMG GUID/MOD SED  03/03/2017  . IR PATIENT EVAL TECH 0-60 MINS  05/12/2017  . IR RADIOLOGIST EVAL & MGMT  11/24/2017  . IR RADIOLOGIST EVAL & MGMT  04/07/2018  . IR RADIOLOGIST EVAL & MGMT  05/18/2019  . LAPAROSCOPY N/A 01/12/2017   Procedure: LAPAROSCOPY DIAGNOSTIC;  Surgeon: Stark Klein, MD;  Location: [place];  Service: General;  Laterality: N/A;  . LAPAROTOMY N/A 03/10/2017   Procedure: EXPLORATORY LAPAROTOMY Open jejunostomy tube;  Surgeon: Stark Klein, MD;  Location: [place];  Service: General;  Laterality: N/A;  . LUMBAR LAMINECTOMY/DECOMPRESSION MICRODISCECTOMY N/A 02/10/2016   Procedure: Lumbar three- four Laminectomy;  Surgeon: Kristeen Miss, MD;  Location: [facility] NEURO ORS;  Service: Neurosurgery;  Laterality: N/A;  L3-4 Laminectomy  . LUMBAR  SPINE SURGERY     1st surgery "ray cage placed"  . THYROIDECTOMY  2005  . TONSILLECTOMY    . WHIPPLE PROCEDURE N/A 01/12/2017   Procedure: WHIPPLE PROCEDURE;  Surgeon: Stark Klein, MD;  Location: [facility] [facility];  Service: General;  Laterality: N/A;     Current Meds  Medication Sig  . acetaminophen (TYLENOL) 500 MG tablet Take 500 mg by mouth every 6 (six) hours as needed for mild pain.  . carvedilol (COREG) 25 MG tablet Take 25 mg by mouth in the morning and at bedtime.  . furosemide (LASIX) 80 MG tablet TAKE 1 TABLET BY MOUTH EVERY DAY ALTERNATING WITH 40MG   . gabapentin (NEURONTIN) 100 MG capsule Take 100 mg by mouth daily.  Marland Kitchen HUMALOG KWIKPEN 100 UNIT/ML KwikPen Inject 7-10 Units into the skin See admin instructions. Three times daily per sliding scale  . LEVEMIR FLEXTOUCH 100 UNIT/ML FlexPen Inject 7 Units into the skin 2 (two) times daily.  Marland Kitchen levothyroxine (SYNTHROID, LEVOTHROID) 200 MCG tablet Take 200 mcg by mouth daily before breakfast. For hypothyroidism  . lidocaine-prilocaine (EMLA) cream Apply to port site 1-2 hours prior to use  . NIFEdipine (PROCARDIA-XL/ADALAT-CC/NIFEDICAL-XL) 30 MG 24 hr tablet Take 30 mg by mouth daily.  . ondansetron (ZOFRAN) 8 MG tablet Take 1 tablet (8 mg total) by mouth every 8 (eight) hours as needed for nausea or vomiting.  Marland Kitchen oxyCODONE-acetaminophen (PERCOCET/ROXICET) 5-325 MG tablet Take 1-2 tablets by mouth every 8 (eight) hours as needed for severe pain.  . pantoprazole (PROTONIX) 40 MG tablet Take 1 tablet (40 mg total) by mouth at bedtime.  . Polyethylene Glycol 3350 (MIRALAX PO) Take 17 g by mouth daily as needed (constipation).  . potassium chloride SA (KLOR-CON M20) 20 MEQ tablet Take 3 tablets daily alternating with 1 tablet daily.  Take with furosemide.  . rosuvastatin (CRESTOR) 20 MG tablet Take 20 mg by mouth daily.   . Vitamin D, Ergocalciferol, (DRISDOL) 1.25 MG (50000 UNIT) CAPS capsule TAKE 1 CAPSULE BY MOUTH ONE TIME PER WEEK  . warfarin  (COUMADIN) 2.5 MG tablet Take 1 tablet (2.5 mg total) by mouth daily.     Allergies:   Hydralazine hcl, Amlodipine besylate, Darvon [propoxyphene hcl], Nyquil multi-symptom [pseudoeph-doxylamine-dm-apap], and Metformin and related   Social History   Tobacco Use  . Smoking status: Never Smoker  . Smokeless tobacco: Never Used  Vaping Use  . Vaping Use: Never used  Substance Use Topics  . Alcohol use: No  . Drug use: Yes  Types: Methamphetamines     Family Hx: The patient's family history includes Heart attack in her father; Heart failure in her mother.  ROS:   Please see the history of present illness.      Labs/Other Tests and Data Reviewed:    EKG:  No ECG reviewed.  Recent Labs: 08/09/2020: B Natriuretic Peptide 391.6 08/10/2020: TSH 6.934 08/12/2020: Magnesium 2.1 08/30/2020: ALT 28; BUN 29; Creatinine 1.10; Hemoglobin 9.3; Platelet Count 286; Potassium 4.0; Sodium 140   Recent Lipid Panel Lab Results  Component Value Date/Time   CHOL 50 08/15/2020 02:49 AM   TRIG 80 08/15/2020 02:49 AM   HDL 13 (L) 08/15/2020 02:49 AM   CHOLHDL 3.8 08/15/2020 02:49 AM   LDLCALC 21 08/15/2020 02:49 AM    Wt Readings from Last 3 Encounters:  09/10/20 178 lb (80.7 kg)  08/19/20 194 lb 14.2 oz (88.4 kg)  08/02/20 187 lb 8 oz (85 kg)     Risk Assessment/Calculations:      Objective:    Vital Signs:  BP (!) 138/50   Ht 5\' 4"  (1.626 m)   Wt 178 lb (80.7 kg)   BMI 30.55 kg/m    VITAL SIGNS:  reviewed GEN:  no acute distress RESPIRATORY:  Normal respiratory effort PSYCH:  normal affect  ASSESSMENT & PLAN:    1. Chronic heart failure with preserved ejection fraction (HCC) EF 50-55 by echocardiogram in 12/21.  NYHA III.  Volume status seems to be stable.  Continue current dose of furosemide.  2. Coronary artery calcification seen on CT scan She is not having anginal symptoms.  She did have elevated cardiac markers in the hospital consistent with demand ischemia.  She  is not a candidate for further ischemic evaluation.  Continue current dose of beta-blocker, statin.  She is not on aspirin as she is on warfarin.  3. Stage 3a chronic kidney disease (HCC) Recent creatinine stable.  4. Essential hypertension The patient's blood pressure is controlled on her current regimen.  Continue current therapy.   5. Mixed hyperlipidemia LDL optimal on most recent lab work.  Continue current Rx.    6. SVT (supraventricular tachycardia) (HCC) She had episodes of atrial tachycardia in the hospital.  She is not experiencing rapid palpitations.  Continue current dose of beta-blocker.  Of note, she is taking carvedilol once daily.  I have asked her to take this twice daily.  7. History of stroke She had evidence of embolic CVA on her MRI in the hospital.  Notes from neurology indicated this was not likely cardioembolic.  She is wearing an event monitor currently to assess for atrial fibrillation.  I suspect if atrial fibrillation is discovered, we could consider changing warfarin to a DOAC like apixaban.  8. 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   9.  History of DVT Warfarin is managed by primary care.  Time:   Today, I have spent 12 minutes with the patient with telehealth technology discussing the above problems.     Medication Adjustments/Labs and Tests Ordered: Current medicines are reviewed at length with the patient today.  Concerns regarding medicines are outlined above.   Tests Ordered: No orders of the defined types were placed in this encounter.   Medication Changes: No orders of the defined types were placed in this encounter.   Follow Up:  Virtual Visit  in 3 month(s)  Signed, Richardson Dopp, PA-C  09/10/2020 1:39 PM  [facility]

## 2020-09-09 NOTE — Telephone Encounter (Signed)
Spoke w Izora Gala at hospice. Pt is home from [facility] and is on hospice at home.  Adv the monitor is a 30 day cardiac event monitor.  Most likely placed after recent CVA.  She will not need to do anything with it.  Adv it should be returned by mail when pt is done wearing it.  She asks if she should continue to wear this and should they keep the appointment tomorrow with Kathleen Argue.  Adv this is virtual and to keep wearing monitor and keep appointment.

## 2020-09-10 ENCOUNTER — Encounter: Payer: Self-pay | Admitting: Physician Assistant

## 2020-09-10 ENCOUNTER — Telehealth (INDEPENDENT_AMBULATORY_CARE_PROVIDER_SITE_OTHER): Payer: Medicare Other | Admitting: Physician Assistant

## 2020-09-10 ENCOUNTER — Other Ambulatory Visit: Payer: Self-pay

## 2020-09-10 VITALS — BP 138/50 | Ht 64.0 in | Wt 178.0 lb

## 2020-09-10 DIAGNOSIS — I2510 Atherosclerotic heart disease of native coronary artery without angina pectoris: Secondary | ICD-10-CM | POA: Diagnosis not present

## 2020-09-10 DIAGNOSIS — N1831 Chronic kidney disease, stage 3a: Secondary | ICD-10-CM | POA: Diagnosis not present

## 2020-09-10 DIAGNOSIS — Z8673 Personal history of transient ischemic attack (TIA), and cerebral infarction without residual deficits: Secondary | ICD-10-CM

## 2020-09-10 DIAGNOSIS — I10 Essential (primary) hypertension: Secondary | ICD-10-CM | POA: Diagnosis not present

## 2020-09-10 DIAGNOSIS — I5032 Chronic diastolic (congestive) heart failure: Secondary | ICD-10-CM

## 2020-09-10 DIAGNOSIS — E782 Mixed hyperlipidemia: Secondary | ICD-10-CM

## 2020-09-10 DIAGNOSIS — I471 Supraventricular tachycardia, unspecified: Secondary | ICD-10-CM

## 2020-09-10 DIAGNOSIS — Z86718 Personal history of other venous thrombosis and embolism: Secondary | ICD-10-CM

## 2020-09-10 DIAGNOSIS — C241 Malignant neoplasm of ampulla of Vater: Secondary | ICD-10-CM

## 2020-09-10 NOTE — Telephone Encounter (Signed)
Pt seen today via virtual visit. She is still wearing the event monitor.  Her daughter notes it was placed on 1/7. Richardson Dopp, PA-C    09/10/2020 1:12 PM

## 2020-09-10 NOTE — Patient Instructions (Signed)
Medication Instructions:  Your physician has recommended you make the following change in your medication:   1) Increase Carvedilol 25 mg, 1 tablet by mouth twice a day  *If you need a refill on your cardiac medications before your next appointment, please call your pharmacy*  Lab Work: None ordered today  Testing/Procedures: None ordered today  Follow-Up: At [facility], you and your health needs are our priority.  As part of our continuing mission to provide you with exceptional heart care, we have created designated Provider Care Teams.  These Care Teams include your primary Cardiologist (physician) and Advanced Practice Providers (APPs -  Physician Assistants and Nurse Practitioners) who all work together to provide you with the care you need, when you need it.  Your next appointment:   3 month(s) on 12/16/20 at 3:40PM  The format for your next appointment:   In Person  Provider:   Dorris Carnes, MD

## 2020-09-10 NOTE — Telephone Encounter (Signed)
Received monitor results from Dr. Harrington Challenger.  End of Service Report.  I called pt's daughter and confirmed she is still wearing the monitor.  Message to monitor tech Lala Lund) to look into.

## 2020-09-11 ENCOUNTER — Telehealth: Payer: Self-pay | Admitting: *Deleted

## 2020-09-11 ENCOUNTER — Encounter: Payer: Self-pay | Admitting: *Deleted

## 2020-09-11 NOTE — Telephone Encounter (Signed)
Thank you Richardson Dopp, PA-C    09/11/2020 4:53 PM

## 2020-09-11 NOTE — Telephone Encounter (Signed)
thanks

## 2020-09-11 NOTE — Telephone Encounter (Signed)
I was asked to look into why a End of Service report for Kaylee Mullins's 30 day cardiac event monitor was imported even though patient was still wearing the monitor.  According to our [employer] representative, patient stopped transmitting 08/28/2020.  [employer] called number on enrollment 4 times, call was not returned.  Early termination was initiated and EOS report posted and imported. Kaylee Mullins had been enrolled for her 30 day cardiac event monitor to be shipped to [place].  Contact phone number listed on monitor enrollment was Kaylee Mullins.  Patient was discharged from Kaylee Mullins to hospice home care at daughter Kaylee Mullins residence.  She was not provided monitor supplies and instruction from [place] when her mother was discharged. Discussed options with Kaylee Mullins.  She will charge monitor battery and cell phone.  Instructed how to insert charged battery and start monitor.  She will contact [employer] this afternoon to restart monitor and confirm it is transmitting. Kaylee Mullins from [place] contacted.  He will have [employer] ship addition monitor strips, instruction book, and box with prepaid UPS shipping label for monitor to be returned to Kaylee Mullins after 09/28/2020.  Monitor term will be extended through 09/28/2020.

## 2020-09-18 NOTE — Progress Notes (Signed)
Kindly inform the patient that cardiac event monitor study for 30 days did not show evidence of atrial fibrillation cardiac arrhythmia

## 2020-09-19 ENCOUNTER — Telehealth: Payer: Self-pay | Admitting: *Deleted

## 2020-09-19 NOTE — Telephone Encounter (Signed)
-----   Message from Garvin Fila, MD sent at 09/18/2020  3:52 PM EST ----- Kindly inform the patient that cardiac event monitor study for 30 days did not show evidence of atrial fibrillation cardiac arrhythmia

## 2020-09-19 NOTE — Telephone Encounter (Signed)
Called and spoke with daughter. Relayed results. She informed me that pt passed this week.

## 2020-09-20 NOTE — Telephone Encounter (Signed)
Thanks for letting me know. Office should send a condolence card

## 2020-09-23 NOTE — Telephone Encounter (Signed)
Josie Dixon has condolences cards at her desk. This is a Dr. Leonie Man patient  [place]

## 2020-09-24 NOTE — Telephone Encounter (Signed)
Condolence card sent to c/o family

## 2020-09-30 ENCOUNTER — Ambulatory Visit: Payer: Medicare Other | Admitting: Oncology

## 2020-10-01 ENCOUNTER — Inpatient Hospital Stay: Payer: Medicare Other

## 2020-10-01 ENCOUNTER — Inpatient Hospital Stay: Payer: Medicare Other | Admitting: Oncology

## 2020-10-15 DEATH — deceased

## 2020-10-23 ENCOUNTER — Ambulatory Visit: Payer: Medicare Other | Admitting: Neurology

## 2020-12-16 ENCOUNTER — Telehealth: Payer: Medicare Other | Admitting: Internal Medicine
